# Patient Record
Sex: Male | Born: 1945 | Race: Black or African American | Hispanic: No | Smoking: Former smoker
Health system: Southern US, Community
[De-identification: ages and names within clinical notes are randomized; demographics above are authoritative.]

## PROBLEM LIST (undated history)

## (undated) DIAGNOSIS — G40909 Epilepsy, unspecified, not intractable, without status epilepticus: Secondary | ICD-10-CM

## (undated) DIAGNOSIS — I701 Atherosclerosis of renal artery: Secondary | ICD-10-CM

## (undated) DIAGNOSIS — Z8249 Family history of ischemic heart disease and other diseases of the circulatory system: Secondary | ICD-10-CM

## (undated) DIAGNOSIS — I255 Ischemic cardiomyopathy: Secondary | ICD-10-CM

## (undated) DIAGNOSIS — K5520 Angiodysplasia of colon without hemorrhage: Secondary | ICD-10-CM

## (undated) DIAGNOSIS — F1910 Other psychoactive substance abuse, uncomplicated: Secondary | ICD-10-CM

## (undated) DIAGNOSIS — J449 Chronic obstructive pulmonary disease, unspecified: Secondary | ICD-10-CM

## (undated) DIAGNOSIS — F329 Major depressive disorder, single episode, unspecified: Secondary | ICD-10-CM

## (undated) DIAGNOSIS — F32A Depression, unspecified: Secondary | ICD-10-CM

## (undated) DIAGNOSIS — I5042 Chronic combined systolic (congestive) and diastolic (congestive) heart failure: Secondary | ICD-10-CM

## (undated) DIAGNOSIS — G4733 Obstructive sleep apnea (adult) (pediatric): Secondary | ICD-10-CM

## (undated) DIAGNOSIS — D509 Iron deficiency anemia, unspecified: Secondary | ICD-10-CM

## (undated) DIAGNOSIS — M545 Low back pain, unspecified: Secondary | ICD-10-CM

## (undated) DIAGNOSIS — R569 Unspecified convulsions: Secondary | ICD-10-CM

## (undated) DIAGNOSIS — J189 Pneumonia, unspecified organism: Secondary | ICD-10-CM

## (undated) DIAGNOSIS — E785 Hyperlipidemia, unspecified: Secondary | ICD-10-CM

## (undated) DIAGNOSIS — I2510 Atherosclerotic heart disease of native coronary artery without angina pectoris: Secondary | ICD-10-CM

## (undated) DIAGNOSIS — G8929 Other chronic pain: Secondary | ICD-10-CM

## (undated) DIAGNOSIS — F209 Schizophrenia, unspecified: Secondary | ICD-10-CM

## (undated) DIAGNOSIS — I219 Acute myocardial infarction, unspecified: Secondary | ICD-10-CM

## (undated) DIAGNOSIS — F319 Bipolar disorder, unspecified: Secondary | ICD-10-CM

## (undated) DIAGNOSIS — I272 Pulmonary hypertension, unspecified: Secondary | ICD-10-CM

## (undated) DIAGNOSIS — M51369 Other intervertebral disc degeneration, lumbar region without mention of lumbar back pain or lower extremity pain: Secondary | ICD-10-CM

## (undated) DIAGNOSIS — I739 Peripheral vascular disease, unspecified: Secondary | ICD-10-CM

## (undated) DIAGNOSIS — N183 Chronic kidney disease, stage 3 unspecified: Secondary | ICD-10-CM

## (undated) DIAGNOSIS — Z9289 Personal history of other medical treatment: Secondary | ICD-10-CM

## (undated) DIAGNOSIS — M5136 Other intervertebral disc degeneration, lumbar region: Secondary | ICD-10-CM

## (undated) DIAGNOSIS — N400 Enlarged prostate without lower urinary tract symptoms: Secondary | ICD-10-CM

## (undated) DIAGNOSIS — I209 Angina pectoris, unspecified: Secondary | ICD-10-CM

## (undated) DIAGNOSIS — Z8719 Personal history of other diseases of the digestive system: Secondary | ICD-10-CM

## (undated) DIAGNOSIS — C801 Malignant (primary) neoplasm, unspecified: Secondary | ICD-10-CM

## (undated) DIAGNOSIS — I10 Essential (primary) hypertension: Secondary | ICD-10-CM

## (undated) DIAGNOSIS — M1990 Unspecified osteoarthritis, unspecified site: Secondary | ICD-10-CM

---

## 2007-08-19 HISTORY — PX: CORONARY ANGIOPLASTY WITH STENT PLACEMENT: SHX49

## 2007-12-18 ENCOUNTER — Ambulatory Visit: Payer: Self-pay | Admitting: Surgery

## 2007-12-19 HISTORY — PX: CORONARY ARTERY BYPASS GRAFT: SHX141

## 2008-11-26 ENCOUNTER — Encounter (INDEPENDENT_AMBULATORY_CARE_PROVIDER_SITE_OTHER): Payer: Self-pay | Admitting: Family Medicine

## 2008-11-26 ENCOUNTER — Encounter: Payer: Self-pay | Admitting: Emergency Medicine

## 2008-11-26 ENCOUNTER — Ambulatory Visit: Payer: Self-pay | Admitting: Cardiology

## 2008-11-26 ENCOUNTER — Ambulatory Visit: Payer: Self-pay | Admitting: Diagnostic Radiology

## 2008-11-26 ENCOUNTER — Inpatient Hospital Stay (HOSPITAL_COMMUNITY): Admission: AD | Admit: 2008-11-26 | Discharge: 2008-11-28 | Payer: Self-pay | Admitting: Internal Medicine

## 2010-03-20 DIAGNOSIS — I272 Pulmonary hypertension, unspecified: Secondary | ICD-10-CM | POA: Diagnosis present

## 2010-03-20 DIAGNOSIS — K922 Gastrointestinal hemorrhage, unspecified: Secondary | ICD-10-CM | POA: Insufficient documentation

## 2010-03-20 HISTORY — DX: Pulmonary hypertension, unspecified: I27.20

## 2010-03-20 HISTORY — PX: OTHER SURGICAL HISTORY: SHX169

## 2010-03-24 ENCOUNTER — Inpatient Hospital Stay (HOSPITAL_COMMUNITY)
Admission: EM | Admit: 2010-03-24 | Discharge: 2010-04-19 | Disposition: A | Discharge status: Home / Self Care | Payer: Medicare Other | Source: Home / Self Care | Attending: Internal Medicine | Admitting: Internal Medicine

## 2010-03-24 LAB — URINALYSIS, ROUTINE W REFLEX MICROSCOPIC
Bilirubin Urine: NEGATIVE
Hemoglobin, Urine: NEGATIVE
Ketones, ur: NEGATIVE mg/dL
Leukocytes, UA: NEGATIVE
Nitrite: NEGATIVE
Protein, ur: 100 mg/dL — AB
Specific Gravity, Urine: 1.024 (ref 1.005–1.030)
Urine Glucose, Fasting: NEGATIVE mg/dL
Urobilinogen, UA: 1 mg/dL (ref 0.0–1.0)
pH: 5 (ref 5.0–8.0)

## 2010-03-24 LAB — POCT I-STAT, CHEM 8
BUN: 38 mg/dL — ABNORMAL HIGH (ref 6–23)
Calcium, Ion: 1.14 mmol/L (ref 1.12–1.32)
Chloride: 109 mEq/L (ref 96–112)
Creatinine, Ser: 1.8 mg/dL — ABNORMAL HIGH (ref 0.4–1.5)
Glucose, Bld: 102 mg/dL — ABNORMAL HIGH (ref 70–99)
HCT: 28 % — ABNORMAL LOW (ref 39.0–52.0)
Hemoglobin: 9.5 g/dL — ABNORMAL LOW (ref 13.0–17.0)
Potassium: 5.3 mEq/L — ABNORMAL HIGH (ref 3.5–5.1)
Sodium: 139 mEq/L (ref 135–145)
TCO2: 22 mmol/L (ref 0–100)

## 2010-03-24 LAB — POCT CARDIAC MARKERS
CKMB, poc: 3.3 ng/mL (ref 1.0–8.0)
CKMB, poc: 1.3 ng/mL (ref 1.0–8.0)
CKMB, poc: 2.1 ng/mL (ref 1.0–8.0)
Myoglobin, poc: 104 ng/mL (ref 12–200)
Myoglobin, poc: 58.1 ng/mL (ref 12–200)
Myoglobin, poc: 76.8 ng/mL (ref 12–200)
Troponin i, poc: <0.05 ng/mL (ref 0.00–0.09)
Troponin i, poc: <0.05 ng/mL (ref 0.00–0.09)
Troponin i, poc: <0.05 ng/mL (ref 0.00–0.09)

## 2010-03-24 LAB — CK TOTAL AND CKMB (NOT AT ARMC)
CK, MB: 3.4 ng/mL (ref 0.3–4.0)
Relative Index: INVALID (ref 0.0–2.5)
Total CK: 76 U/L (ref 7–232)

## 2010-03-24 LAB — RAPID URINE DRUG SCREEN, HOSP PERFORMED
Amphetamines: NOT DETECTED
Barbiturates: NOT DETECTED
Benzodiazepines: NOT DETECTED
Cocaine: POSITIVE — AB
Opiates: NOT DETECTED
Tetrahydrocannabinol: NOT DETECTED

## 2010-03-24 LAB — HEPATIC FUNCTION PANEL
ALT: 29 U/L (ref 0–53)
AST: 63 U/L — ABNORMAL HIGH (ref 0–37)
Albumin: 3 g/dL — ABNORMAL LOW (ref 3.5–5.2)
Alkaline Phosphatase: 84 U/L (ref 39–117)
Bilirubin, Direct: 0.6 mg/dL — ABNORMAL HIGH (ref 0.0–0.3)
Indirect Bilirubin: 0.4 mg/dL (ref 0.3–0.9)
Total Bilirubin: 1 mg/dL (ref 0.3–1.2)
Total Protein: 6.9 g/dL (ref 6.0–8.3)

## 2010-03-24 LAB — T4, FREE: Free T4: 1.17 ng/dL (ref 0.80–1.80)

## 2010-03-24 LAB — MRSA PCR SCREENING: MRSA by PCR: NEGATIVE

## 2010-03-24 LAB — PROCALCITONIN: Procalcitonin: 0.11 ng/mL

## 2010-03-24 LAB — URINE MICROSCOPIC-ADD ON

## 2010-03-24 LAB — LIPASE, BLOOD: Lipase: 42 U/L (ref 11–59)

## 2010-03-24 LAB — BRAIN NATRIURETIC PEPTIDE: Pro B Natriuretic peptide (BNP): >3200 pg/mL — ABNORMAL HIGH (ref 0.0–100.0)

## 2010-03-24 LAB — TSH: TSH: 1.598 u[IU]/mL (ref 0.350–4.500)

## 2010-03-24 LAB — TROPONIN I: Troponin I: 0.05 ng/mL (ref 0.00–0.06)

## 2010-03-24 LAB — LACTIC ACID, PLASMA: Lactic Acid, Venous: 1.7 mmol/L (ref 0.5–2.2)

## 2010-03-25 ENCOUNTER — Encounter (INDEPENDENT_AMBULATORY_CARE_PROVIDER_SITE_OTHER): Payer: Self-pay | Admitting: Internal Medicine

## 2010-03-25 LAB — CBC
HCT: 23.6 % — ABNORMAL LOW (ref 39.0–52.0)
HCT: 24.5 % — ABNORMAL LOW (ref 39.0–52.0)
Hemoglobin: 7.1 g/dL — ABNORMAL LOW (ref 13.0–17.0)
Hemoglobin: 7.5 g/dL — ABNORMAL LOW (ref 13.0–17.0)
MCH: 27.3 pg (ref 26.0–34.0)
MCH: 28 pg (ref 26.0–34.0)
MCHC: 30.1 g/dL (ref 30.0–36.0)
MCHC: 30.6 g/dL (ref 30.0–36.0)
MCV: 90.8 fL (ref 78.0–100.0)
MCV: 91.4 fL (ref 78.0–100.0)
Platelets: 259 10*3/uL (ref 150–400)
Platelets: 280 10*3/uL (ref 150–400)
RBC: 2.6 MIL/uL — ABNORMAL LOW (ref 4.22–5.81)
RBC: 2.68 MIL/uL — ABNORMAL LOW (ref 4.22–5.81)
RDW: 22.3 % — ABNORMAL HIGH (ref 11.5–15.5)
RDW: 22.4 % — ABNORMAL HIGH (ref 11.5–15.5)
WBC: 5.1 10*3/uL (ref 4.0–10.5)
WBC: 5.5 10*3/uL (ref 4.0–10.5)

## 2010-03-25 LAB — DIFFERENTIAL
Basophils Absolute: 0 10*3/uL (ref 0.0–0.1)
Basophils Absolute: 0 10*3/uL (ref 0.0–0.1)
Basophils Relative: 0 % (ref 0–1)
Basophils Relative: 0 % (ref 0–1)
Eosinophils Absolute: 0.1 10*3/uL (ref 0.0–0.7)
Eosinophils Absolute: 0.1 10*3/uL (ref 0.0–0.7)
Eosinophils Relative: 1 % (ref 0–5)
Eosinophils Relative: 2 % (ref 0–5)
Lymphocytes Relative: 17 % (ref 12–46)
Lymphocytes Relative: 21 % (ref 12–46)
Lymphs Abs: 0.9 10*3/uL (ref 0.7–4.0)
Lymphs Abs: 1.2 10*3/uL (ref 0.7–4.0)
Monocytes Absolute: 0.7 10*3/uL (ref 0.1–1.0)
Monocytes Absolute: 0.7 10*3/uL (ref 0.1–1.0)
Monocytes Relative: 13 % — ABNORMAL HIGH (ref 3–12)
Monocytes Relative: 13 % — ABNORMAL HIGH (ref 3–12)
Neutro Abs: 3.4 10*3/uL (ref 1.7–7.7)
Neutro Abs: 3.6 10*3/uL (ref 1.7–7.7)
Neutrophils Relative %: 65 % (ref 43–77)
Neutrophils Relative %: 68 % (ref 43–77)

## 2010-03-25 LAB — LIPID PANEL
Cholesterol: 59 mg/dL (ref 0–200)
HDL: 15 mg/dL — ABNORMAL LOW (ref >39)
LDL Cholesterol: 33 mg/dL (ref 0–99)
Total CHOL/HDL Ratio: 3.9 RATIO
Triglycerides: 54 mg/dL (ref <150)
VLDL: 11 mg/dL (ref 0–40)

## 2010-03-25 LAB — TROPONIN I
Troponin I: 0.05 ng/mL (ref 0.00–0.06)
Troponin I: 0.04 ng/mL (ref 0.00–0.06)

## 2010-03-25 LAB — COMPREHENSIVE METABOLIC PANEL
ALT: 30 U/L (ref 0–53)
AST: 66 U/L — ABNORMAL HIGH (ref 0–37)
Albumin: 2.7 g/dL — ABNORMAL LOW (ref 3.5–5.2)
Alkaline Phosphatase: 73 U/L (ref 39–117)
BUN: 33 mg/dL — ABNORMAL HIGH (ref 6–23)
CO2: 22 mEq/L (ref 19–32)
Calcium: 8.6 mg/dL (ref 8.4–10.5)
Chloride: 105 mEq/L (ref 96–112)
Creatinine, Ser: 1.91 mg/dL — ABNORMAL HIGH (ref 0.4–1.5)
GFR calc Af Amer: 43 mL/min — ABNORMAL LOW (ref >60)
GFR calc non Af Amer: 35 mL/min — ABNORMAL LOW (ref >60)
Glucose, Bld: 72 mg/dL (ref 70–99)
Potassium: 4.9 mEq/L (ref 3.5–5.1)
Sodium: 135 mEq/L (ref 135–145)
Total Bilirubin: 1.1 mg/dL (ref 0.3–1.2)
Total Protein: 6.7 g/dL (ref 6.0–8.3)

## 2010-03-25 LAB — BRAIN NATRIURETIC PEPTIDE: Pro B Natriuretic peptide (BNP): >3200 pg/mL — ABNORMAL HIGH (ref 0.0–100.0)

## 2010-03-25 LAB — CK TOTAL AND CKMB (NOT AT ARMC)
CK, MB: 3 ng/mL (ref 0.3–4.0)
CK, MB: 2.7 ng/mL (ref 0.3–4.0)
Relative Index: INVALID (ref 0.0–2.5)
Relative Index: INVALID (ref 0.0–2.5)
Total CK: 70 U/L (ref 7–232)
Total CK: 62 U/L (ref 7–232)

## 2010-03-25 LAB — PHOSPHORUS: Phosphorus: 3.2 mg/dL (ref 2.3–4.6)

## 2010-03-25 LAB — HEMOGLOBIN A1C
Hgb A1c MFr Bld: 5.1 % (ref <5.7)
Mean Plasma Glucose: 100 mg/dL (ref <117)

## 2010-03-25 LAB — MAGNESIUM: Magnesium: 1.8 mg/dL (ref 1.5–2.5)

## 2010-04-04 LAB — RENAL FUNCTION PANEL
Albumin: 2.8 g/dL — ABNORMAL LOW (ref 3.5–5.2)
Albumin: 2.8 g/dL — ABNORMAL LOW (ref 3.5–5.2)
Albumin: 2.8 g/dL — ABNORMAL LOW (ref 3.5–5.2)
BUN: 35 mg/dL — ABNORMAL HIGH (ref 6–23)
BUN: 38 mg/dL — ABNORMAL HIGH (ref 6–23)
BUN: 38 mg/dL — ABNORMAL HIGH (ref 6–23)
CO2: 26 mEq/L (ref 19–32)
CO2: 29 mEq/L (ref 19–32)
CO2: 24 mEq/L (ref 19–32)
Calcium: 8.6 mg/dL (ref 8.4–10.5)
Calcium: 8.4 mg/dL (ref 8.4–10.5)
Calcium: 8.3 mg/dL — ABNORMAL LOW (ref 8.4–10.5)
Chloride: 94 mEq/L — ABNORMAL LOW (ref 96–112)
Chloride: 96 mEq/L (ref 96–112)
Chloride: 100 mEq/L (ref 96–112)
Creatinine, Ser: 1.64 mg/dL — ABNORMAL HIGH (ref 0.4–1.5)
Creatinine, Ser: 1.59 mg/dL — ABNORMAL HIGH (ref 0.4–1.5)
Creatinine, Ser: 1.38 mg/dL (ref 0.4–1.5)
GFR calc Af Amer: 51 mL/min — ABNORMAL LOW (ref >60)
GFR calc Af Amer: 53 mL/min — ABNORMAL LOW (ref >60)
GFR calc Af Amer: >60 mL/min (ref >60)
GFR calc non Af Amer: 42 mL/min — ABNORMAL LOW (ref >60)
GFR calc non Af Amer: 44 mL/min — ABNORMAL LOW (ref >60)
GFR calc non Af Amer: 52 mL/min — ABNORMAL LOW (ref >60)
Glucose, Bld: 200 mg/dL — ABNORMAL HIGH (ref 70–99)
Glucose, Bld: 134 mg/dL — ABNORMAL HIGH (ref 70–99)
Glucose, Bld: 231 mg/dL — ABNORMAL HIGH (ref 70–99)
Phosphorus: 3.1 mg/dL (ref 2.3–4.6)
Phosphorus: 3.2 mg/dL (ref 2.3–4.6)
Phosphorus: 1.1 mg/dL — ABNORMAL LOW (ref 2.3–4.6)
Potassium: 3.7 mEq/L (ref 3.5–5.1)
Potassium: 3.4 mEq/L — ABNORMAL LOW (ref 3.5–5.1)
Potassium: 3.9 mEq/L (ref 3.5–5.1)
Sodium: 130 mEq/L — ABNORMAL LOW (ref 135–145)
Sodium: 133 mEq/L — ABNORMAL LOW (ref 135–145)
Sodium: 132 mEq/L — ABNORMAL LOW (ref 135–145)

## 2010-04-04 LAB — BASIC METABOLIC PANEL
BUN: 36 mg/dL — ABNORMAL HIGH (ref 6–23)
BUN: 38 mg/dL — ABNORMAL HIGH (ref 6–23)
BUN: 42 mg/dL — ABNORMAL HIGH (ref 6–23)
BUN: 33 mg/dL — ABNORMAL HIGH (ref 6–23)
BUN: 36 mg/dL — ABNORMAL HIGH (ref 6–23)
BUN: 38 mg/dL — ABNORMAL HIGH (ref 6–23)
BUN: 35 mg/dL — ABNORMAL HIGH (ref 6–23)
BUN: 30 mg/dL — ABNORMAL HIGH (ref 6–23)
BUN: 30 mg/dL — ABNORMAL HIGH (ref 6–23)
CO2: 30 mEq/L (ref 19–32)
CO2: 29 mEq/L (ref 19–32)
CO2: 28 mEq/L (ref 19–32)
CO2: 26 mEq/L (ref 19–32)
CO2: 22 mEq/L (ref 19–32)
CO2: 23 mEq/L (ref 19–32)
CO2: 22 mEq/L (ref 19–32)
CO2: 27 mEq/L (ref 19–32)
CO2: 24 mEq/L (ref 19–32)
Calcium: 8.3 mg/dL — ABNORMAL LOW (ref 8.4–10.5)
Calcium: 8.1 mg/dL — ABNORMAL LOW (ref 8.4–10.5)
Calcium: 8.2 mg/dL — ABNORMAL LOW (ref 8.4–10.5)
Calcium: 8.8 mg/dL (ref 8.4–10.5)
Calcium: 8.7 mg/dL (ref 8.4–10.5)
Calcium: 8.7 mg/dL (ref 8.4–10.5)
Calcium: 8.8 mg/dL (ref 8.4–10.5)
Calcium: 8.8 mg/dL (ref 8.4–10.5)
Calcium: 8.8 mg/dL (ref 8.4–10.5)
Chloride: 95 mEq/L — ABNORMAL LOW (ref 96–112)
Chloride: 94 mEq/L — ABNORMAL LOW (ref 96–112)
Chloride: 97 mEq/L (ref 96–112)
Chloride: 104 mEq/L (ref 96–112)
Chloride: 103 mEq/L (ref 96–112)
Chloride: 101 mEq/L (ref 96–112)
Chloride: 97 mEq/L (ref 96–112)
Chloride: 99 mEq/L (ref 96–112)
Chloride: 94 mEq/L — ABNORMAL LOW (ref 96–112)
Creatinine, Ser: 1.38 mg/dL (ref 0.4–1.5)
Creatinine, Ser: 1.25 mg/dL (ref 0.4–1.5)
Creatinine, Ser: 1.33 mg/dL (ref 0.4–1.5)
Creatinine, Ser: 1.16 mg/dL (ref 0.4–1.5)
Creatinine, Ser: 1.39 mg/dL (ref 0.4–1.5)
Creatinine, Ser: 1.51 mg/dL — ABNORMAL HIGH (ref 0.4–1.5)
Creatinine, Ser: 1.62 mg/dL — ABNORMAL HIGH (ref 0.4–1.5)
Creatinine, Ser: 1.44 mg/dL (ref 0.4–1.5)
Creatinine, Ser: 1.4 mg/dL (ref 0.4–1.5)
GFR calc Af Amer: >60 mL/min (ref >60)
GFR calc Af Amer: >60 mL/min (ref >60)
GFR calc Af Amer: >60 mL/min (ref >60)
GFR calc Af Amer: >60 mL/min (ref >60)
GFR calc Af Amer: >60 mL/min (ref >60)
GFR calc Af Amer: 56 mL/min — ABNORMAL LOW (ref >60)
GFR calc Af Amer: 52 mL/min — ABNORMAL LOW (ref >60)
GFR calc Af Amer: 59 mL/min — ABNORMAL LOW (ref >60)
GFR calc Af Amer: >60 mL/min (ref >60)
GFR calc non Af Amer: 52 mL/min — ABNORMAL LOW (ref >60)
GFR calc non Af Amer: 58 mL/min — ABNORMAL LOW (ref >60)
GFR calc non Af Amer: 54 mL/min — ABNORMAL LOW (ref >60)
GFR calc non Af Amer: >60 mL/min (ref >60)
GFR calc non Af Amer: 51 mL/min — ABNORMAL LOW (ref >60)
GFR calc non Af Amer: 46 mL/min — ABNORMAL LOW (ref >60)
GFR calc non Af Amer: 43 mL/min — ABNORMAL LOW (ref >60)
GFR calc non Af Amer: 49 mL/min — ABNORMAL LOW (ref >60)
GFR calc non Af Amer: 51 mL/min — ABNORMAL LOW (ref >60)
Glucose, Bld: 149 mg/dL — ABNORMAL HIGH (ref 70–99)
Glucose, Bld: 95 mg/dL (ref 70–99)
Glucose, Bld: 99 mg/dL (ref 70–99)
Glucose, Bld: 105 mg/dL — ABNORMAL HIGH (ref 70–99)
Glucose, Bld: 86 mg/dL (ref 70–99)
Glucose, Bld: 100 mg/dL — ABNORMAL HIGH (ref 70–99)
Glucose, Bld: 144 mg/dL — ABNORMAL HIGH (ref 70–99)
Glucose, Bld: 68 mg/dL — ABNORMAL LOW (ref 70–99)
Glucose, Bld: 131 mg/dL — ABNORMAL HIGH (ref 70–99)
Potassium: 2.8 mEq/L — ABNORMAL LOW (ref 3.5–5.1)
Potassium: 2.8 mEq/L — ABNORMAL LOW (ref 3.5–5.1)
Potassium: 3.7 mEq/L (ref 3.5–5.1)
Potassium: 4.6 mEq/L (ref 3.5–5.1)
Potassium: 6.8 mEq/L (ref 3.5–5.1)
Potassium: 7.1 mEq/L (ref 3.5–5.1)
Potassium: 4.9 mEq/L (ref 3.5–5.1)
Potassium: 5.4 mEq/L — ABNORMAL HIGH (ref 3.5–5.1)
Potassium: 4.7 mEq/L (ref 3.5–5.1)
Sodium: 135 mEq/L (ref 135–145)
Sodium: 134 mEq/L — ABNORMAL LOW (ref 135–145)
Sodium: 134 mEq/L — ABNORMAL LOW (ref 135–145)
Sodium: 138 mEq/L (ref 135–145)
Sodium: 133 mEq/L — ABNORMAL LOW (ref 135–145)
Sodium: 134 mEq/L — ABNORMAL LOW (ref 135–145)
Sodium: 131 mEq/L — ABNORMAL LOW (ref 135–145)
Sodium: 133 mEq/L — ABNORMAL LOW (ref 135–145)
Sodium: 129 mEq/L — ABNORMAL LOW (ref 135–145)

## 2010-04-04 LAB — CBC
HCT: 24.8 % — ABNORMAL LOW (ref 39.0–52.0)
HCT: 24.7 % — ABNORMAL LOW (ref 39.0–52.0)
HCT: 23.7 % — ABNORMAL LOW (ref 39.0–52.0)
HCT: 27.9 % — ABNORMAL LOW (ref 39.0–52.0)
HCT: 28.6 % — ABNORMAL LOW (ref 39.0–52.0)
HCT: 29.3 % — ABNORMAL LOW (ref 39.0–52.0)
HCT: 26.2 % — ABNORMAL LOW (ref 39.0–52.0)
Hemoglobin: 7.9 g/dL — ABNORMAL LOW (ref 13.0–17.0)
Hemoglobin: 7.7 g/dL — ABNORMAL LOW (ref 13.0–17.0)
Hemoglobin: 7.3 g/dL — ABNORMAL LOW (ref 13.0–17.0)
Hemoglobin: 8.7 g/dL — ABNORMAL LOW (ref 13.0–17.0)
Hemoglobin: 9 g/dL — ABNORMAL LOW (ref 13.0–17.0)
Hemoglobin: 9.1 g/dL — ABNORMAL LOW (ref 13.0–17.0)
Hemoglobin: 8.3 g/dL — ABNORMAL LOW (ref 13.0–17.0)
MCH: 27.8 pg (ref 26.0–34.0)
MCH: 27.2 pg (ref 26.0–34.0)
MCH: 26.9 pg (ref 26.0–34.0)
MCH: 27.4 pg (ref 26.0–34.0)
MCH: 27.7 pg (ref 26.0–34.0)
MCH: 27.5 pg (ref 26.0–34.0)
MCH: 27.8 pg (ref 26.0–34.0)
MCHC: 31.9 g/dL (ref 30.0–36.0)
MCHC: 31.2 g/dL (ref 30.0–36.0)
MCHC: 30.8 g/dL (ref 30.0–36.0)
MCHC: 31.2 g/dL (ref 30.0–36.0)
MCHC: 31.5 g/dL (ref 30.0–36.0)
MCHC: 31.1 g/dL (ref 30.0–36.0)
MCHC: 31.7 g/dL (ref 30.0–36.0)
MCV: 87.3 fL (ref 78.0–100.0)
MCV: 87.3 fL (ref 78.0–100.0)
MCV: 87.5 fL (ref 78.0–100.0)
MCV: 87.7 fL (ref 78.0–100.0)
MCV: 88 fL (ref 78.0–100.0)
MCV: 88.5 fL (ref 78.0–100.0)
MCV: 87.6 fL (ref 78.0–100.0)
Platelets: 298 10*3/uL (ref 150–400)
Platelets: 273 10*3/uL (ref 150–400)
Platelets: 261 10*3/uL (ref 150–400)
Platelets: 252 10*3/uL (ref 150–400)
Platelets: 259 10*3/uL (ref 150–400)
Platelets: 243 10*3/uL (ref 150–400)
Platelets: 207 10*3/uL (ref 150–400)
RBC: 2.84 MIL/uL — ABNORMAL LOW (ref 4.22–5.81)
RBC: 2.83 MIL/uL — ABNORMAL LOW (ref 4.22–5.81)
RBC: 2.71 MIL/uL — ABNORMAL LOW (ref 4.22–5.81)
RBC: 3.18 MIL/uL — ABNORMAL LOW (ref 4.22–5.81)
RBC: 3.25 MIL/uL — ABNORMAL LOW (ref 4.22–5.81)
RBC: 3.31 MIL/uL — ABNORMAL LOW (ref 4.22–5.81)
RBC: 2.99 MIL/uL — ABNORMAL LOW (ref 4.22–5.81)
RDW: 20 % — ABNORMAL HIGH (ref 11.5–15.5)
RDW: 19.7 % — ABNORMAL HIGH (ref 11.5–15.5)
RDW: 19.9 % — ABNORMAL HIGH (ref 11.5–15.5)
RDW: 18.7 % — ABNORMAL HIGH (ref 11.5–15.5)
RDW: 18.8 % — ABNORMAL HIGH (ref 11.5–15.5)
RDW: 19 % — ABNORMAL HIGH (ref 11.5–15.5)
RDW: 18.8 % — ABNORMAL HIGH (ref 11.5–15.5)
WBC: 12.1 10*3/uL — ABNORMAL HIGH (ref 4.0–10.5)
WBC: 12.2 10*3/uL — ABNORMAL HIGH (ref 4.0–10.5)
WBC: 10.2 10*3/uL (ref 4.0–10.5)
WBC: 8.6 10*3/uL (ref 4.0–10.5)
WBC: 10.2 10*3/uL (ref 4.0–10.5)
WBC: 10.4 10*3/uL (ref 4.0–10.5)
WBC: 7.8 10*3/uL (ref 4.0–10.5)

## 2010-04-04 LAB — CARDIAC PANEL(CRET KIN+CKTOT+MB+TROPI)
CK, MB: 2.2 ng/mL (ref 0.3–4.0)
CK, MB: 2.4 ng/mL (ref 0.3–4.0)
CK, MB: 2.4 ng/mL (ref 0.3–4.0)
Relative Index: INVALID (ref 0.0–2.5)
Relative Index: INVALID (ref 0.0–2.5)
Relative Index: INVALID (ref 0.0–2.5)
Total CK: 22 U/L (ref 7–232)
Total CK: 22 U/L (ref 7–232)
Total CK: 26 U/L (ref 7–232)
Troponin I: 0.03 ng/mL (ref 0.00–0.06)
Troponin I: 0.04 ng/mL (ref 0.00–0.06)
Troponin I: 0.05 ng/mL (ref 0.00–0.06)

## 2010-04-04 LAB — CLOSTRIDIUM DIFFICILE BY PCR: Toxigenic C. Difficile by PCR: NEGATIVE

## 2010-04-04 LAB — HEMOGLOBIN A1C
Hgb A1c MFr Bld: 5 % (ref <5.7)
Hgb A1c MFr Bld: 7.1 % — ABNORMAL HIGH (ref <5.7)
Mean Plasma Glucose: 97 mg/dL (ref <117)
Mean Plasma Glucose: 157 mg/dL — ABNORMAL HIGH (ref <117)

## 2010-04-04 LAB — CROSSMATCH
ABO/RH(D): O POS
Antibody Screen: NEGATIVE
Unit division: 0
Unit division: 0

## 2010-04-04 LAB — GLUCOSE, CAPILLARY
Glucose-Capillary: 174 mg/dL — ABNORMAL HIGH (ref 70–99)
Glucose-Capillary: 156 mg/dL — ABNORMAL HIGH (ref 70–99)
Glucose-Capillary: 101 mg/dL — ABNORMAL HIGH (ref 70–99)
Glucose-Capillary: 102 mg/dL — ABNORMAL HIGH (ref 70–99)
Glucose-Capillary: 69 mg/dL — ABNORMAL LOW (ref 70–99)
Glucose-Capillary: 99 mg/dL (ref 70–99)
Glucose-Capillary: 114 mg/dL — ABNORMAL HIGH (ref 70–99)

## 2010-04-04 LAB — FERRITIN: Ferritin: 56 ng/mL (ref 22–322)

## 2010-04-04 LAB — IRON AND TIBC
Iron: 13 ug/dL — ABNORMAL LOW (ref 42–135)
Saturation Ratios: 4 % — ABNORMAL LOW (ref 20–55)
TIBC: 360 ug/dL (ref 215–435)
UIBC: 347 ug/dL

## 2010-04-04 LAB — BRAIN NATRIURETIC PEPTIDE
Pro B Natriuretic peptide (BNP): 866 pg/mL — ABNORMAL HIGH (ref 0.0–100.0)
Pro B Natriuretic peptide (BNP): 890 pg/mL — ABNORMAL HIGH (ref 0.0–100.0)

## 2010-04-04 LAB — POTASSIUM: Potassium: 7 mEq/L (ref 3.5–5.1)

## 2010-04-04 LAB — VITAMIN B12: Vitamin B-12: 508 pg/mL (ref 211–911)

## 2010-04-04 LAB — ABO/RH: ABO/RH(D): O POS

## 2010-04-04 LAB — FOLATE: Folate: 11.3 ng/mL

## 2010-04-04 LAB — HEMOCCULT GUIAC POC 1CARD (OFFICE): Fecal Occult Bld: POSITIVE

## 2010-04-05 ENCOUNTER — Encounter: Payer: Self-pay | Admitting: Internal Medicine

## 2010-04-06 LAB — BASIC METABOLIC PANEL
BUN: 30 mg/dL — ABNORMAL HIGH (ref 6–23)
BUN: 20 mg/dL (ref 6–23)
CO2: 25 mEq/L (ref 19–32)
CO2: 27 mEq/L (ref 19–32)
Calcium: 9 mg/dL (ref 8.4–10.5)
Calcium: 8.9 mg/dL (ref 8.4–10.5)
Chloride: 98 mEq/L (ref 96–112)
Chloride: 99 mEq/L (ref 96–112)
Creatinine, Ser: 1.26 mg/dL (ref 0.4–1.5)
Creatinine, Ser: 1.05 mg/dL (ref 0.4–1.5)
GFR calc Af Amer: >60 mL/min (ref >60)
GFR calc Af Amer: >60 mL/min (ref >60)
GFR calc non Af Amer: 57 mL/min — ABNORMAL LOW (ref >60)
GFR calc non Af Amer: >60 mL/min (ref >60)
Glucose, Bld: 141 mg/dL — ABNORMAL HIGH (ref 70–99)
Glucose, Bld: 76 mg/dL (ref 70–99)
Potassium: 4.2 mEq/L (ref 3.5–5.1)
Potassium: 4.4 mEq/L (ref 3.5–5.1)
Sodium: 135 mEq/L (ref 135–145)
Sodium: 134 mEq/L — ABNORMAL LOW (ref 135–145)

## 2010-04-06 LAB — GLUCOSE, CAPILLARY
Glucose-Capillary: 130 mg/dL — ABNORMAL HIGH (ref 70–99)
Glucose-Capillary: 175 mg/dL — ABNORMAL HIGH (ref 70–99)
Glucose-Capillary: 78 mg/dL (ref 70–99)
Glucose-Capillary: 88 mg/dL (ref 70–99)
Glucose-Capillary: 77 mg/dL (ref 70–99)
Glucose-Capillary: 122 mg/dL — ABNORMAL HIGH (ref 70–99)
Glucose-Capillary: 68 mg/dL — ABNORMAL LOW (ref 70–99)
Glucose-Capillary: 85 mg/dL (ref 70–99)
Glucose-Capillary: 138 mg/dL — ABNORMAL HIGH (ref 70–99)

## 2010-04-06 LAB — CBC
HCT: 32.2 % — ABNORMAL LOW (ref 39.0–52.0)
HCT: 31.5 % — ABNORMAL LOW (ref 39.0–52.0)
Hemoglobin: 10.2 g/dL — ABNORMAL LOW (ref 13.0–17.0)
Hemoglobin: 10.2 g/dL — ABNORMAL LOW (ref 13.0–17.0)
MCH: 27.4 pg (ref 26.0–34.0)
MCH: 27.6 pg (ref 26.0–34.0)
MCHC: 31.7 g/dL (ref 30.0–36.0)
MCHC: 32.4 g/dL (ref 30.0–36.0)
MCV: 86.6 fL (ref 78.0–100.0)
MCV: 85.1 fL (ref 78.0–100.0)
Platelets: 232 10*3/uL (ref 150–400)
Platelets: 214 10*3/uL (ref 150–400)
RBC: 3.72 MIL/uL — ABNORMAL LOW (ref 4.22–5.81)
RBC: 3.7 MIL/uL — ABNORMAL LOW (ref 4.22–5.81)
RDW: 18.1 % — ABNORMAL HIGH (ref 11.5–15.5)
RDW: 18 % — ABNORMAL HIGH (ref 11.5–15.5)
WBC: 8.1 10*3/uL (ref 4.0–10.5)
WBC: 7.4 10*3/uL (ref 4.0–10.5)

## 2010-04-06 LAB — CROSSMATCH
ABO/RH(D): O POS
Antibody Screen: NEGATIVE
Unit division: 0

## 2010-04-11 LAB — BASIC METABOLIC PANEL
BUN: 25 mg/dL — ABNORMAL HIGH (ref 6–23)
BUN: 34 mg/dL — ABNORMAL HIGH (ref 6–23)
BUN: 34 mg/dL — ABNORMAL HIGH (ref 6–23)
CO2: 25 mEq/L (ref 19–32)
CO2: 24 mEq/L (ref 19–32)
Calcium: 8.7 mg/dL (ref 8.4–10.5)
Calcium: 8.5 mg/dL (ref 8.4–10.5)
Calcium: 8.2 mg/dL — ABNORMAL LOW (ref 8.4–10.5)
Chloride: 101 mEq/L (ref 96–112)
Chloride: 102 mEq/L (ref 96–112)
Creatinine, Ser: 1.3 mg/dL (ref 0.4–1.5)
Creatinine, Ser: 1.19 mg/dL (ref 0.4–1.5)
GFR calc Af Amer: >60 mL/min (ref >60)
GFR calc Af Amer: >60 mL/min (ref >60)
GFR calc non Af Amer: 55 mL/min — ABNORMAL LOW (ref >60)
GFR calc non Af Amer: >60 mL/min (ref >60)
GFR calc non Af Amer: >60 mL/min (ref >60)
Glucose, Bld: 96 mg/dL (ref 70–99)
Glucose, Bld: 69 mg/dL — ABNORMAL LOW (ref 70–99)
Glucose, Bld: 113 mg/dL — ABNORMAL HIGH (ref 70–99)
Potassium: 4.3 mEq/L (ref 3.5–5.1)
Potassium: 4.3 mEq/L (ref 3.5–5.1)
Sodium: 134 mEq/L — ABNORMAL LOW (ref 135–145)
Sodium: 136 mEq/L (ref 135–145)

## 2010-04-11 LAB — CBC
HCT: 31.6 % — ABNORMAL LOW (ref 39.0–52.0)
HCT: 30.7 % — ABNORMAL LOW (ref 39.0–52.0)
Hemoglobin: 9.9 g/dL — ABNORMAL LOW (ref 13.0–17.0)
Hemoglobin: 9.7 g/dL — ABNORMAL LOW (ref 13.0–17.0)
MCH: 27.2 pg (ref 26.0–34.0)
MCH: 27.3 pg (ref 26.0–34.0)
MCHC: 31.3 g/dL (ref 30.0–36.0)
MCHC: 31.6 g/dL (ref 30.0–36.0)
MCV: 86.8 fL (ref 78.0–100.0)
MCV: 86.5 fL (ref 78.0–100.0)
Platelets: 205 10*3/uL (ref 150–400)
Platelets: 189 10*3/uL (ref 150–400)
RBC: 3.64 MIL/uL — ABNORMAL LOW (ref 4.22–5.81)
RBC: 3.55 MIL/uL — ABNORMAL LOW (ref 4.22–5.81)
RDW: 18.1 % — ABNORMAL HIGH (ref 11.5–15.5)
RDW: 18 % — ABNORMAL HIGH (ref 11.5–15.5)
WBC: 8.3 10*3/uL (ref 4.0–10.5)
WBC: 9.9 10*3/uL (ref 4.0–10.5)

## 2010-04-11 LAB — GLUCOSE, CAPILLARY
Glucose-Capillary: 101 mg/dL — ABNORMAL HIGH (ref 70–99)
Glucose-Capillary: 142 mg/dL — ABNORMAL HIGH (ref 70–99)
Glucose-Capillary: 108 mg/dL — ABNORMAL HIGH (ref 70–99)
Glucose-Capillary: 70 mg/dL (ref 70–99)
Glucose-Capillary: 168 mg/dL — ABNORMAL HIGH (ref 70–99)
Glucose-Capillary: 119 mg/dL — ABNORMAL HIGH (ref 70–99)

## 2010-04-11 LAB — BRAIN NATRIURETIC PEPTIDE: Pro B Natriuretic peptide (BNP): 238 pg/mL — ABNORMAL HIGH (ref 0.0–100.0)

## 2010-04-12 ENCOUNTER — Encounter: Payer: Self-pay | Admitting: Gastroenterology

## 2010-04-12 LAB — CBC
HCT: 24 % — ABNORMAL LOW (ref 39.0–52.0)
HCT: 22.3 % — ABNORMAL LOW (ref 39.0–52.0)
Hemoglobin: 7.5 g/dL — ABNORMAL LOW (ref 13.0–17.0)
Hemoglobin: 7.3 g/dL — ABNORMAL LOW (ref 13.0–17.0)
Hemoglobin: 7.5 g/dL — ABNORMAL LOW (ref 13.0–17.0)
MCH: 27 pg (ref 26.0–34.0)
MCH: 28.2 pg (ref 26.0–34.0)
MCHC: 31.3 g/dL (ref 30.0–36.0)
MCHC: 32.7 g/dL (ref 30.0–36.0)
MCHC: 32.8 g/dL (ref 30.0–36.0)
RBC: 2.78 MIL/uL — ABNORMAL LOW (ref 4.22–5.81)
RBC: 2.6 MIL/uL — ABNORMAL LOW (ref 4.22–5.81)
RDW: 17.3 % — ABNORMAL HIGH (ref 11.5–15.5)
WBC: 6.7 10*3/uL (ref 4.0–10.5)

## 2010-04-12 LAB — COMPREHENSIVE METABOLIC PANEL
Alkaline Phosphatase: 122 U/L — ABNORMAL HIGH (ref 39–117)
BUN: 32 mg/dL — ABNORMAL HIGH (ref 6–23)
CO2: 25 mEq/L (ref 19–32)
Calcium: 8.8 mg/dL (ref 8.4–10.5)
Creatinine, Ser: 1.05 mg/dL (ref 0.4–1.5)
Glucose, Bld: 150 mg/dL — ABNORMAL HIGH (ref 70–99)
Sodium: 136 mEq/L (ref 135–145)
Total Bilirubin: 0.4 mg/dL (ref 0.3–1.2)
Total Protein: 6.5 g/dL (ref 6.0–8.3)

## 2010-04-12 LAB — MAGNESIUM: Magnesium: 1.9 mg/dL (ref 1.5–2.5)

## 2010-04-12 LAB — BASIC METABOLIC PANEL
BUN: 31 mg/dL — ABNORMAL HIGH (ref 6–23)
CO2: 24 mEq/L (ref 19–32)
CO2: 27 mEq/L (ref 19–32)
Calcium: 8.3 mg/dL — ABNORMAL LOW (ref 8.4–10.5)
Calcium: 8.5 mg/dL (ref 8.4–10.5)
Chloride: 105 mEq/L (ref 96–112)
Creatinine, Ser: 1.2 mg/dL (ref 0.4–1.5)
GFR calc Af Amer: >60 mL/min (ref >60)
GFR calc non Af Amer: >60 mL/min (ref >60)
GFR calc non Af Amer: >60 mL/min (ref >60)
Glucose, Bld: 136 mg/dL — ABNORMAL HIGH (ref 70–99)
Glucose, Bld: 128 mg/dL — ABNORMAL HIGH (ref 70–99)
Glucose, Bld: 84 mg/dL (ref 70–99)
Potassium: 4.1 mEq/L (ref 3.5–5.1)
Potassium: 4 mEq/L (ref 3.5–5.1)
Sodium: 137 mEq/L (ref 135–145)
Sodium: 135 mEq/L (ref 135–145)

## 2010-04-12 LAB — HEMOGLOBIN AND HEMATOCRIT, BLOOD
HCT: 24.1 % — ABNORMAL LOW (ref 39.0–52.0)
Hemoglobin: 7.9 g/dL — ABNORMAL LOW (ref 13.0–17.0)

## 2010-04-12 LAB — GLUCOSE, CAPILLARY
Glucose-Capillary: 95 mg/dL (ref 70–99)
Glucose-Capillary: 116 mg/dL — ABNORMAL HIGH (ref 70–99)
Glucose-Capillary: 135 mg/dL — ABNORMAL HIGH (ref 70–99)
Glucose-Capillary: 103 mg/dL — ABNORMAL HIGH (ref 70–99)

## 2010-04-12 LAB — CROSSMATCH: ABO/RH(D): O POS

## 2010-04-12 LAB — RETICULOCYTES
RBC.: 2.83 MIL/uL — ABNORMAL LOW (ref 4.22–5.81)
Retic Ct Pct: 1.6 % (ref 0.4–3.1)

## 2010-04-12 LAB — HEMOCCULT GUIAC POC 1CARD (OFFICE): Fecal Occult Bld: POSITIVE

## 2010-04-12 LAB — PHOSPHORUS: Phosphorus: 2.4 mg/dL (ref 2.3–4.6)

## 2010-04-13 LAB — IMMUNOFIXATION ELECTROPHORESIS
IgM, Serum: 66 mg/dL (ref 60–263)
Total Protein ELP: 6.5 g/dL (ref 6.0–8.3)

## 2010-04-13 LAB — CBC
HCT: 23.7 % — ABNORMAL LOW (ref 39.0–52.0)
HCT: 22.8 % — ABNORMAL LOW (ref 39.0–52.0)
Hemoglobin: 7.7 g/dL — ABNORMAL LOW (ref 13.0–17.0)
Hemoglobin: 7.4 g/dL — ABNORMAL LOW (ref 13.0–17.0)
MCH: 28 pg (ref 26.0–34.0)
MCH: 28 pg (ref 26.0–34.0)
MCHC: 32.5 g/dL (ref 30.0–36.0)
MCV: 86.2 fL (ref 78.0–100.0)
MCV: 86.4 fL (ref 78.0–100.0)
RBC: 2.64 MIL/uL — ABNORMAL LOW (ref 4.22–5.81)

## 2010-04-13 LAB — BASIC METABOLIC PANEL
BUN: 32 mg/dL — ABNORMAL HIGH (ref 6–23)
CO2: 27 mEq/L (ref 19–32)
CO2: 23 mEq/L (ref 19–32)
Calcium: 8.8 mg/dL (ref 8.4–10.5)
Chloride: 101 mEq/L (ref 96–112)
Creatinine, Ser: 1.18 mg/dL (ref 0.4–1.5)
Creatinine, Ser: 1.28 mg/dL (ref 0.4–1.5)
Glucose, Bld: 90 mg/dL (ref 70–99)
Glucose, Bld: 93 mg/dL (ref 70–99)
Sodium: 137 mEq/L (ref 135–145)

## 2010-04-13 LAB — PROTEIN, URINE, 24 HOUR: Collection Interval-UPROT: 24 hours

## 2010-04-13 LAB — GLUCOSE, CAPILLARY: Glucose-Capillary: 142 mg/dL — ABNORMAL HIGH (ref 70–99)

## 2010-04-13 LAB — PROTEIN ELECTROPH W RFLX QUANT IMMUNOGLOBULINS
Albumin ELP: 49.4 % — ABNORMAL LOW (ref 55.8–66.1)
Alpha-1-Globulin: 5.6 % — ABNORMAL HIGH (ref 2.9–4.9)

## 2010-04-13 LAB — CREATININE CLEARANCE, URINE, 24 HOUR
Creatinine, 24H Ur: 904 mg/d (ref 800–2000)
Creatinine: 1.18 mg/dL (ref 0.4–1.5)
Urine Total Volume-CRCL: 2050 mL

## 2010-04-14 LAB — UIFE/LIGHT CHAINS/TP QN, 24-HR UR
Albumin, U: DETECTED
Free Kappa/Lambda Ratio: 13.54 ratio — ABNORMAL HIGH (ref 0.46–4.00)
Free Lambda Excretion/Day: 16.2 mg/d
Free Lambda Lt Chains,Ur: 0.79 mg/dL (ref 0.08–1.01)
Free Lt Chn Excr Rate: 219.35 mg/d
Total Protein, Urine-Ur/day: 324 mg/d — ABNORMAL HIGH (ref 10–140)
Total Protein, Urine: 15.8 mg/dL
Volume, Urine: 2050 mL

## 2010-04-14 LAB — URINE CULTURE
Colony Count: >=100000
Culture  Setup Time: 201201052121

## 2010-04-14 LAB — GLUCOSE, CAPILLARY
Glucose-Capillary: 172 mg/dL — ABNORMAL HIGH (ref 70–99)
Glucose-Capillary: 84 mg/dL (ref 70–99)

## 2010-04-14 NOTE — Discharge Summary (Addendum)
  [name], Charles              ACCOUNT NO.:  [id number]  MEDICAL RECORD NO.:  [id number]          PATIENT TYPE:  INP  LOCATION:  3732                         FACILITY:  [facility]  PHYSICIAN:  Keithan Dileonardo I Shatarra Wehling, MD      DATE OF BIRTH:  01-27-1946  DATE OF ADMISSION:  03/24/2010 DATE OF DISCHARGE:  04/07/2010                              DISCHARGE SUMMARY   PRIMARY CARE PHYSICIAN:  Darius Bump, M.D. at [facility].  DISCHARGE DIAGNOSIS:  Acute-on-chronic systolic congestive heart failure with EF around 35-40%.   Dictation ended at this point.     Charles Eckels Bosie Helper, MD     HIE/MEDQ  D:  04/07/2010  T:  04/07/2010  Job:  [id number]  Electronically Signed by Charles Cargo MD on 04/14/2010 02:00:23 PM

## 2010-04-14 NOTE — Progress Notes (Addendum)
Charles [name], Charles [name]              ACCOUNT NO.:  [id number]  MEDICAL RECORD NO.:  [id number]          PATIENT TYPE:  INP  LOCATION:  3032                         FACILITY:  [facility]  PHYSICIAN:  Charles Brazeau Bosie Helper, MD      DATE OF BIRTH:  05/07/1945                                PROGRESS NOTE   PRIMARY CARE PHYSICIAN: Charles Bump, MD  DISCHARGE DIAGNOSES: 1. Acute on chronic systolic congestive heart failure, EF of 35-40%. 2. Severe pulmonary hypertension felt to be secondary to cocaine abuse     and left ventricular failure and pulmonary embolism was ruled out. 3. Acute renal failure, resolved. 4. Hyperkalemia, resolved. 5. Active gastrointestinal bleeding status post EGD and colonoscopy     negative. 6. Capsule endoscopy done today, which was pending. 7. Iron-deficiency anemia. 8. Acute blood loss anemia status post 4 units of blood transfusion. 9. Cocaine abuse. 10.Hypertension. 11.Hypercholesterolemia.  DISCHARGE MEDICATIONS: To be dictated at the date of actual discharge.  PROCEDURE: 1. CT of the abdomen and pelvis, small pleural effusion, ascites,     anasarca, could indicate that there is presence of volume overload     or hypoproteinemia. 2. CT head without contrast, no acute intracranial abnormality. 3. V/Q scan, probably normal ventilation perfusion. 4. Chest x-ray no acute abnormality. 5. EGD and colonoscopy.  Normal colon, internal hemorrhoids. 6. EGD, hiatal hernia.  No abnormalities.  HISTORY OF PRESENT ILLNESS: This is a 65 year old with a history of cardiac bypass in 2009, suspected cardiomyopathy with an EF 35-40%, with chief complaint of chest pain and epigastric pain.  On presentation to the Emergency Room, the patient had 12-lead EKG, shows sinus rhythm, non-ST segment intraventricular conduction delay, non-specific ST-T wave changes, no change from previous EKG.  Hemoglobin was 9.5 and hematocrit 28.  The patient have a BUN of 38 and creatinine 1.8.   BNP is elevated up to 3200.  1. Chest pain.  The patient rule out for MI with cardiac enzyme and     troponin which were negative.  During hospital stay, the patient     noticed to have melena and GI were consulted.  The patient     underwent EGD and colonoscopy, reports were normal.  The patient     received 3 units of blood transfusion.  But, during hospital stay     the patient continued to have drop in his hemoglobin and required     another unit of blood transfusion, hemoglobin at this time is 7.7.     We have to reconsult the Gastroenterology to evaluate the cause for     the patient's drop in his hemoglobin.  Anemia slowly did show     evidence for iron-deficiency anemia.  Dr. Truett [name], Hematologist     evaluated the patient and recommend, this is most probably     secondary to iron-deficiency anemia.  I recommend Procrit and     ferrous sulfate, which was prescribed for the patient.  1. Acute on chronic systolic CHF.  The patient had significant lower     extremity swelling which respond to recurrent dose of Lasix for his  potassium sulfate with potassium supplement.  Echo did show the     patient have increase in pulmonary systolic blood pressure, which     causes most probably secondary to cocaine and left ventricular     heart failure.  V/Q scan was negative and venous Doppler of the     patient's lower extremity was negative.  1. Cocaine abuse, counseling was done during hospital stay.  1. Acute renal failure, resolved.  DISPOSITION: The patient will be discharged to shelter pending clearance from GI.     Charles Gruenwald Bosie Helper, MD     HIE/MEDQ  D:  04/12/2010  T:  04/12/2010  Job:  [id number]  Electronically Signed by Charles Cargo MD on 04/14/2010 02:00:29 PM

## 2010-04-15 ENCOUNTER — Encounter: Payer: Self-pay | Admitting: Gastroenterology

## 2010-04-15 LAB — BASIC METABOLIC PANEL
BUN: 27 mg/dL — ABNORMAL HIGH (ref 6–23)
Chloride: 102 mEq/L (ref 96–112)
Potassium: 4.4 mEq/L (ref 3.5–5.1)
Sodium: 139 mEq/L (ref 135–145)

## 2010-04-15 LAB — CBC
HCT: 27.3 % — ABNORMAL LOW (ref 39.0–52.0)
Hemoglobin: 8.6 g/dL — ABNORMAL LOW (ref 13.0–17.0)
MCV: 86.9 fL (ref 78.0–100.0)
RDW: 19.5 % — ABNORMAL HIGH (ref 11.5–15.5)
WBC: 6.8 10*3/uL (ref 4.0–10.5)

## 2010-04-15 LAB — GLUCOSE, CAPILLARY: Glucose-Capillary: 86 mg/dL (ref 70–99)

## 2010-04-16 LAB — CROSSMATCH
ABO/RH(D): O POS
Unit division: 0

## 2010-04-16 LAB — CBC
MCV: 87.1 fL (ref 78.0–100.0)
Platelets: 478 10*3/uL — ABNORMAL HIGH (ref 150–400)
RBC: 3.1 MIL/uL — ABNORMAL LOW (ref 4.22–5.81)
RDW: 19.5 % — ABNORMAL HIGH (ref 11.5–15.5)
WBC: 8.9 10*3/uL (ref 4.0–10.5)

## 2010-04-16 LAB — COMPREHENSIVE METABOLIC PANEL
ALT: 28 U/L (ref 0–53)
AST: 51 U/L — ABNORMAL HIGH (ref 0–37)
Albumin: 3.1 g/dL — ABNORMAL LOW (ref 3.5–5.2)
Alkaline Phosphatase: 80 U/L (ref 39–117)
Calcium: 8.4 mg/dL (ref 8.4–10.5)
GFR calc Af Amer: >60 mL/min (ref >60)
Potassium: 4.4 mEq/L (ref 3.5–5.1)
Sodium: 133 mEq/L — ABNORMAL LOW (ref 135–145)
Total Protein: 6.5 g/dL (ref 6.0–8.3)

## 2010-04-16 LAB — GLUCOSE, CAPILLARY
Glucose-Capillary: 115 mg/dL — ABNORMAL HIGH (ref 70–99)
Glucose-Capillary: 115 mg/dL — ABNORMAL HIGH (ref 70–99)
Glucose-Capillary: 154 mg/dL — ABNORMAL HIGH (ref 70–99)

## 2010-04-17 LAB — GLUCOSE, CAPILLARY
Glucose-Capillary: 107 mg/dL — ABNORMAL HIGH (ref 70–99)
Glucose-Capillary: 100 mg/dL — ABNORMAL HIGH (ref 70–99)
Glucose-Capillary: 108 mg/dL — ABNORMAL HIGH (ref 70–99)

## 2010-04-17 LAB — CBC
Hemoglobin: 8.7 g/dL — ABNORMAL LOW (ref 13.0–17.0)
MCH: 27.8 pg (ref 26.0–34.0)
MCHC: 31.4 g/dL (ref 30.0–36.0)
MCV: 88.5 fL (ref 78.0–100.0)
Platelets: 469 10*3/uL — ABNORMAL HIGH (ref 150–400)
RBC: 3.13 MIL/uL — ABNORMAL LOW (ref 4.22–5.81)

## 2010-04-17 LAB — BASIC METABOLIC PANEL
BUN: 27 mg/dL — ABNORMAL HIGH (ref 6–23)
CO2: 24 mEq/L (ref 19–32)
Calcium: 8.7 mg/dL (ref 8.4–10.5)
Chloride: 97 mEq/L (ref 96–112)
Creatinine, Ser: 1.79 mg/dL — ABNORMAL HIGH (ref 0.4–1.5)
GFR calc Af Amer: 46 mL/min — ABNORMAL LOW (ref >60)

## 2010-04-17 LAB — URINALYSIS, ROUTINE W REFLEX MICROSCOPIC
Hgb urine dipstick: NEGATIVE
Specific Gravity, Urine: 1.011 (ref 1.005–1.030)
Urine Glucose, Fasting: NEGATIVE mg/dL

## 2010-04-17 LAB — SODIUM, URINE, RANDOM: Sodium, Ur: 94 mEq/L

## 2010-04-17 LAB — CREATININE, URINE, RANDOM: Creatinine, Urine: 55.6 mg/dL

## 2010-04-18 LAB — BASIC METABOLIC PANEL
Calcium: 8.6 mg/dL (ref 8.4–10.5)
Creatinine, Ser: 1.55 mg/dL — ABNORMAL HIGH (ref 0.4–1.5)
GFR calc Af Amer: 55 mL/min — ABNORMAL LOW (ref >60)
GFR calc non Af Amer: 45 mL/min — ABNORMAL LOW (ref >60)
Glucose, Bld: 80 mg/dL (ref 70–99)
Sodium: 133 mEq/L — ABNORMAL LOW (ref 135–145)

## 2010-04-18 LAB — CBC
HCT: 25.4 % — ABNORMAL LOW (ref 39.0–52.0)
MCH: 28.7 pg (ref 26.0–34.0)
MCHC: 32.7 g/dL (ref 30.0–36.0)
RDW: 19.1 % — ABNORMAL HIGH (ref 11.5–15.5)

## 2010-04-18 LAB — GLUCOSE, CAPILLARY: Glucose-Capillary: 79 mg/dL (ref 70–99)

## 2010-04-18 NOTE — Consult Note (Signed)
NAMESABIEN, [name]              ACCOUNT NO.:  [id number]  MEDICAL RECORD NO.:  [id number]          PATIENT TYPE:  INP  LOCATION:  3032                         FACILITY:  [facility]  PHYSICIAN:  Leighton Roach. Truett Perna, M.D. DATE OF BIRTH:  [birth year]  DATE OF CONSULTATION:  04/11/2010 DATE OF DISCHARGE:                                CONSULTATION   REASON FOR CONSULTATION:  Anemia.  HISTORY OF PRESENT ILLNESS:  [name] is a 65 year old African American homeless male, with multiple medical problems including cardiac  issues, as well as renal insufficiency and cocaine habituation.  The patient was admitted with chest pain on March 24, 2010, complicated with epigastric pain and vomiting.  Labs on admission were remarkable for a hemoglobin of 9.5 and a hematocrit of 28, with eventual drop, and a Hemoccult showing positive results.  GI evaluation was requested, and upper endoscopy was performed on April 03, 2010, showing no bleed, and hiatal hernia.  The colonoscopy on April 05, 2010, revealed internal hemorrhoids, but no masses or lesions to explain his anemia.  To date, the patient received 4 units of packed RBCs, after his hemoglobin was showing values between 7 and 22 respectively.  Iron was 13 on March 29, 2010, with a ferritin of 56.  The patient has not received iron supplements to date.  It is important to mention that back in September 2010, his hemoglobin was 12.4 with a hematocrit of 38.5 respectively. Also noted, in reviewing records, he has received 2 units of blood in [place] in December and 2 units while in [place] back in October 2011.  At this time, no records are available for review.  We were asked to see the patient in consultation, while he is having other issues managed, including cardiac, and renal insufficiency.  Of note, his CT of the abdomen and pelvis with contrast upon admission on March 24, 2010, other than small pleural effusion, ascites, and  anasarca shows no masses or organomegaly.  CT of the brain is negative for hemorrage or masses.  PAST MEDICAL HISTORY: 1. History of systolic congestive heart failure, ejection fraction 35%     to 40%. 2. CAD status post MI in 2009. 3. Obstructive sleep apnea, does not use CPAP. 4. Chronic bronchitis, with acute episode requiring antibiotics in the     form of Avelox, as well as steroids, from April 01, 2010, through     April 04, 2010. 5. History of cocaine abuse. 6. Hypertension. 7. Medicine noncompliance. 8. Homelessness.  SURGERY:  Status post CABG in October 2009.  ALLERGIES:  TYLENOL and MOTRIN.  MEDICATIONS:  Norvasc, aspirin, Coreg, Plavix, Depakote, Lasix, Mucinex, Apresoline, Imdur, Prinivil, Protonix, K-Dur, triamcinolone cream, Tussionex, Atrovent, morphine sulfate.  As an outpatient, he takes aspirin and Plavix.  REVIEW OF SYSTEMS:  Remarkable for dyspnea on exertion, some orthopnea, fatigue, occasional nausea, vomiting on admission, which is now resolved, intermittent cramps, GERD symptoms.  Rest of the review of systems is negative.  FAMILY HISTORY:  Positive for cardiac disease, but negative for cancers or hematological abnormalities.  SOCIAL HISTORY:  The patient is single.  He has one daughter  in [place], he lives in a [facility] homeless shelter, but wants to move to a shelter in [place]. Originally from [place] [facility] Full code.  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  He denies any tobacco or alcohol; however, he does admit to cocaine, last on March 23, 2010.  PHYSICAL EXAMINATION:  GENERAL:  This is a well-developed 65 year old African American male in no acute distress, alert and oriented x3. VITAL SIGNS:  Blood pressure 141/69, pulse 85, respirations 20, temperature 98.2, O2 sats 98% on room air, weight 72 kg. HEENT:  Normocephalic, atraumatic.  Arcus senilis.  PERRLA.  Oral cavity without thrush or  lesions. NECK:  Remarkable for left scalene, shotty palpable lymph node, with no other cervical or supraclavicular masses. LUNGS:  Essentially clear to auscultation bilaterally.  No axillary masses. CARDIOVASCULAR:  Regular rate and rhythm with 1/6 systolic murmur.  No rubs or gallops. ABDOMEN:  Soft, nontender.  Bowel sounds x4.  No hepatosplenomegaly. GU and RECTAL: Asymmetrical testes. No penile lesions. no discharge. No masses palpable. EXTREMITIES:  No clubbing or cyanosis.  There is 1+ edema bilaterally. SKIN:  Remarkable for subcutaneous lesions, of unknown etiology, especially in the left hip towards the buttock area,tender upon palpation,  and in his back other similar lesions noted. NEURO:  Nonfocal. MUSCULOSKELETAL:  No spinal tenderness.  RADIOLOGICAL STUDIES:  As per HPI.  LABORATORY DATA:  Hemoglobin 7.5, hematocrit 22.9, white count 5.9, platelets 178.  MCV 86.1, iron 13, TIBC 360, percentage saturation 4, ferritin 56, folic acid 11.3, B12 of 508, ANC 3.4, lymphocytes 0.9, monocytes 0.7.  BNP 890.  Sodium 137, potassium 4.0, BUN 31, creatinine 1.05, glucose 84, calcium 8.5, magnesium 1.9.  TSH 1598.  Cocaine was positive on March 24, 2010.  Peripheral blood smear shows components of iron deficiency and possibly renal insufficiency, with hypochromic RBCs, ovalocytes, few tear drops, one nucleated RBC, mild increase in polychromasia.  His white count and platelets are normal.  IMPRESSION: 1. Anemia. 2. Congestive heart failure. 3. Heme-positive stools. 4. Renal insufficiency. 5. Proteinuria. 6. Hypertension. 7. Cocaine abuse. 8. Skin lesions.  Dr. Truett Perna has seen and evaluated the patient.  The anemia is likely multifactorial with components of GI bleed, iron deficiency, and possibly renal insufficiency.  The patient has no symptoms to suggest a malignancy, but this is possible.  RECOMMENDATIONS: 1. Start ferrous sulfate when the GI workup is  completed. 2. Check 24-hour urine for creatinine clearance, and UPEP. 3. Check SPEP and serum immunofixation. 4. Check HIV. 5. Trial of EPO pending on the 24-hour creatinine clearance and the     EPO levels.  Thank you very much for allowing [place] the opportunity to participate in the care of Mr. Buys, following the results of the labs, further recommendations are to proceed.     Marlowe Kays, P.A.   ______________________________ Leighton Roach. Truett Perna, M.D.    SW/MEDQ  D:  04/12/2010  T:  04/12/2010  Job:  [id number]  cc:   Darius Bump, M.D. Dr. Albin Felling  Electronically Signed by Marlowe Kays P.A. on 04/13/2010 08:31:20 AM Electronically Signed by Thornton Papas M.D. on 04/18/2010 02:17:54 PM

## 2010-04-19 LAB — KAPPA/LAMBDA LIGHT CHAINS
Kappa free light chain: 3.38 mg/dL — ABNORMAL HIGH (ref 0.33–1.94)
Lambda free light chains: 3.18 mg/dL — ABNORMAL HIGH (ref 0.57–2.63)

## 2010-04-19 LAB — GLUCOSE, CAPILLARY: Glucose-Capillary: 85 mg/dL (ref 70–99)

## 2010-04-20 NOTE — Op Note (Signed)
  [name], Charles NO.:  [id number]  MEDICAL RECORD NO.:  [id number]          PATIENT TYPE:  INP  LOCATION:  3032                         FACILITY:  [facility]  PHYSICIAN:  Iva Boop, MD,FACGDATE OF BIRTH:  1945/06/28  DATE OF PROCEDURE:  04/12/2010 DATE OF DISCHARGE:                              OPERATIVE REPORT   PROCEDURE:  Small bowel capsule endoscopy.  INDICATION:  Persistent heme-positive stools and anemia, GI bleeding with unrevealing upper endoscopy and colonoscopy.  DATA:  Gastric passage time 56 minutes.  Small bowel passage time 5 hours 20 minutes.  FINDINGS:  There is active oozing of blood in the proximal small bowel approximately 35 minutes beyond the first duodenal image.  This was an otherwise normal examination.  The blood was seen approximately 35 minutes past the first duodenal image as stated and then last blood and clots seen at about 50 minutes or so.  There were no other lesions seen as stated.  RECOMMENDATIONS AND PLAN:  A push enteroscopy is appropriate to try to identify and possibly treat the source of bleeding.  It is not really clear where he is bleeding from or what type of lesion.     Iva Boop, MD,FACG     CEG/MEDQ  D:  04/14/2010  T:  04/15/2010  Job:  [id number]  Electronically Signed by Stan Head MDFACG on 04/20/2010 07:09:19 PM

## 2010-04-21 NOTE — Procedures (Signed)
Summary: Colonoscopy  Patient: [name] Note: All result statuses are Final unless otherwise noted.  Tests: (1) Colonoscopy (COL)   COL Colonoscopy           DONE     [facility]     [address]     [place], [place]  [address]           COLONOSCOPY PROCEDURE REPORT           PATIENT:  Charles [name], Charles [name]  MR#:  [id number]     BIRTHDATE:  [birth year], 64 yrs. old  GENDER:  male     ENDOSCOPIST:  Wilhemina Bonito. Eda Keys, MD     REF. BY:  [facility],     PROCEDURE DATE:  04/05/2010     PROCEDURE:  Diagnostic Colonoscopy     ASA CLASS:  Class III     INDICATIONS:  heme positive stool, Anemia     MEDICATIONS:   Fentanyl 75 mcg IV, Versed 7.5 mg           DESCRIPTION OF PROCEDURE:   After the risks benefits and     alternatives of the procedure were thoroughly explained, informed     consent was obtained.  Digital rectal exam was performed and     revealed no abnormalities.   The Pentax Colonoscope [id number]     endoscope was introduced through the anus and advanced to the     cecum, which was identified by both the appendix and ileocecal     valve, without limitations.  The quality of the prep was good,     using Colyte.  The instrument was then slowly withdrawn as the     colon was fully examined.     <<PROCEDUREIMAGES>>           FINDINGS:  A normal appearing cecum, ileocecal valve, and     appendiceal orifice were identified. The ascending, hepatic     flexure, transverse, splenic flexure, descending, sigmoid colon,     and rectum appeared unremarkable.   Retroflexed views in the     rectum revealed internal hemorrhoids.    The scope was then     withdrawn from the patient and the procedure completed.           COMPLICATIONS:  None     ENDOSCOPIC IMPRESSION:     1) Normal colon     2) Internal hemorrhoids     RECOMMENDATIONS:     1) Continue current colorectal screening recommendations for     "routine risk" patients with a repeat colonoscopy in 10  years.     ______________________________     Wilhemina Bonito. Eda Keys, MD           CC:  [facility];   Denice Paradise,           n.     eSIGNED:   Wilhemina Bonito. Eda Keys at 04/05/2010 04:13 PM           Charles [name], [id number]  Note: An exclamation mark (!) indicates a result that was not dispersed into the flowsheet. Document Creation Date: 04/05/2010 4:13 PM _______________________________________________________________________  (1) Order result status: Final Collection or observation date-time: 04/05/2010 16:07 Requested date-time:  Receipt date-time:  Reported date-time:  Referring Physician:   Ordering Physician: Fransico Setters [phone]) Specimen Source:  Source: Launa Grill Order Number: [id number] Lab site:

## 2010-04-21 NOTE — Procedures (Signed)
Summary: Enteroscopy  Patient: [name] Note: All result statuses are Final unless otherwise noted.  Tests: (1) Upper Endoscopy (EGD)   EGD Upper Endoscopy       DONE     [facility]     [address]     [place], [place]  [address]           ENTEROSCOPY PROCEDURE REPORT           PATIENT:  Charles, [name]  MR#:  [id number]     BIRTHDATE:  06-Jan-1946, 64 yrs. old  GENDER:  male     ENDOSCOPIST:  Rachael Fee, MD     PROCEDURE DATE:  04/15/2010     PROCEDURE:  Enteroscopy with lesion ablation     ASA CLASS:  Class III     INDICATIONS:  persistent heme + anemia; no convincing source on     colonoscopy or EGD; recent capsule endoscopy showed bleeding from     proximal small bowel     MEDICATIONS:  Fentanyl 75 mcg IV, Versed 6 mg IV     TOPICAL ANESTHETIC:  Cetacaine Spray           DESCRIPTION OF PROCEDURE:   After the risks benefits and     alternatives of the procedure were thoroughly explained, informed     consent was obtained.  The EG-[id number] ([id number]) and [id number]     ([id number]) endoscope was introduced through the mouth and advanced     to the proximal jejunum, without limitations.  The instrument was     slowly withdrawn as the mucosa was fully examined.     <<PROCEDUREIMAGES>>     There were 3 small, cherry red AVMs in visualized small bowel.     None were actively bleeding.  These were all treated with APC, the     most distal AVM bleed during treatment (see image003, image004,     and image005).  A hiatal hernia was found. This was 2cm.     Otherwise the examination was normal (see image006 and image007).     Retroflexed views revealed no abnormalities.    The scope was then     withdrawn from the patient and the procedure completed.     COMPLICATIONS:  None           ENDOSCOPIC IMPRESSION:     1) Three typical appearing small bowel AVMs, all treated with     APC to destruction     2) 2cm hiatal hernia     3) Otherwise normal  examination           RECOMMENDATIONS:     Follow clinically.     Would not restart plavix unless absolutely needed for     cardiovascular disease.           ______________________________     Rachael Fee, MD           n.     eSIGNED:   Rachael Fee at 04/15/2010 11:43 AM           Aileen Fass, [id number]  Note: An exclamation mark (!) indicates a result that was not dispersed into the flowsheet. Document Creation Date: 04/15/2010 11:43 AM _______________________________________________________________________  (1) Order result status: Final Collection or observation date-time: 04/15/2010 11:36 Requested date-time:  Receipt date-time:  Reported date-time:  Referring Physician:   Ordering Physician: Rob Bunting [phone]) Specimen Source:  Source: Launa Grill Order Number: [id number] Lab site:

## 2010-04-21 NOTE — Procedures (Signed)
Summary: CAPSULE ENDOSCOPY REPORT    REFERRING PHYSICIAN: Rob Bunting, MD READ BY: Stan Head, MD   REASON FOR REFERRAL: 65 YO MALE HOSPITALIZED WITH ANEMIA, FE DEFICIENCY, HEME POSITIVE STOOLS. PT WITH PULM HTN/CHF. UNREVEALING EGD AND COLON.  PATIENT DATA: Height: 65.0 inches. Weight: 159 lbs. Waist: 32.0 onches. Build:Normal. Shelbie Proctor Passage Time: 0h 60m. Small Bowel Passage: Time: 5h 18m.  PROCEDURE INFO & FINDINGS: 1)COMPLETE STUDY, GOOD PREP 2)ACTIVE OOZING OF BLOOD IN THE PROXIMAL SMALL BOWEL, UNABLE TO IDENTIFY SOURCE. BLOOD FIRST SEEN ABOUT 35 MINUTES BEYOND FIRST DUODENAL IMAGE.  SUMMARY & RECOMMENDATIONS: PUSH ENTEROSCOPY IS APPROPRIATE TO IDENTIFY AND POSSIBLY TREAT THE BLEEDING.

## 2010-04-30 NOTE — Discharge Summary (Signed)
Charles [name], Charles [name]              ACCOUNT NO.:  [id number]  MEDICAL RECORD NO.:  [id number]          PATIENT TYPE:  INP  LOCATION:  3032                         FACILITY:  [facility]  PHYSICIAN:  Kathlen Mody, MD       DATE OF BIRTH:  07-26-1945  DATE OF ADMISSION:  03/24/2010 DATE OF DISCHARGE:  04/19/2010                              DISCHARGE SUMMARY   PRIMARY CARE PROVIDER:  MD at [facility].  DISCHARGE DIAGNOSES: 1. Acute on chronic systolic congestive heart failure with an ejection     fraction of 35%-40%. 2. Active gastrointestinal status post EGD and colonoscopy, status     post capsule endoscopy. 3. Pulmonary hypertension. 4. Chronic renal failure. 5. Blood loss anemia status post 4 units of blood transfusion. 6. Hyperkalemia. 7. Cocaine abuse. 8. Hypertension. 9. Hypercholesterolemia.  DISCHARGE MEDICATIONS: 1. Isosorbide mononitrate 60 mg half daily. 2. Nitroglycerin 0.4 mg of sublingual as needed. 3. Aspirin 81 mg daily. 4. Hydralazine 25 mg p.o. 3 times a day. 5. Amlodipine 10 mg daily. 6. Ferrous sulfate 325 mg twice a day. 7. Spironolactone 25 mg daily. 8. Simvastatin 40 mg daily. 9. Lasix 40 mg daily. 10.Coreg 3.125 mg twice a day. 11.Potassium chloride 10 mEq daily. 12.Hydralazine 25 mg p.o. 3 tablets daily.  CONSULTS:  Gastroenterology consult for GI bleed.  Hematology consult from Dr. Truett Perna for anemia.  PROCEDURES DONE:  The patient had an upper endoscopy, colonoscopy, small bowel capsule, endoscopy.  PERTINENT LABORATORY DATA:  CHEM-8 showed a hemoglobin of 9.5, hematocrit of 28.  Sodium of 139, potassium of 5.3, chloride of 109, glucose of 102, BUN of 38, creatinine of 1.8, bicarb of 22.  Point-of- care cardiac markers were negative.  Urine drug screen came positive for cocaine.  Next set of point-of-care cardiac markers were negative. Liver function tests showed that bilirubin of 0.6 for the total bilirubin of normal, elevated AST, lipase of  42.  Brain natriuretic peptide more than 3200.  Next set of point-of-care cardiac markers were negative.  Lactic acid was 1.7.  Next set of cardiac enzymes negative. Urinalysis showed negative procalcitonin was 0.11.  MRSA screen was negative.  TSH was 1.598.  Free T4 1.17.  CBC showed hemoglobin of 7.1, hematocrit of 23.6, MCV of 90.8.  CBC done January 6 showed a hemoglobin of 7.5, hematocrit of 24.5, hemoglobin A1c of 5.  Urine culture came back more than 100,000 colonies of coagulase negative staph.  Anemia panel, March 29, 2010, showed an iron level of 313, folate of 11.35, vitamin B12 of 508, ferritin of 56.  Stool for fecal occult blood came back positive.  Another fecal occult blood came positive.  Absolute reticulocyte count of 45.  HIV antibody nonreactive.  Erythropoietin level of 58.9, 24-hour protein level came back high, _________ 328. Kappa free light chains came back high with a load of 3.38.  Lambda free light chains came back high level of 3.18.  On the date of discharge, the patient's CBC showed a hemoglobin of 893, hematocrit of 25.4, platelets of 464, WBC count of 7.7.  Basic metabolic panel showed a sodium of 133, potassium of  5.1, creatinine of 1.55, BUN of 23, chloride of 94, bicarb of 25, glucose of 80.  RADIOLOGY:  The patient had chest x-ray on March 24, 2010, showed enlarged cardiac silhouette, which appears larger than on prior study, non-vascular congestion pattern with slight prominence of the interstitial markings, no consolidation or pleural effusion present.  CT abdomen and pelvis with contrast was done on the same day showed small pleural effusions, ascites and anasarca could indicate third spacing in the presence of volume overload or hypoproteinemia.  Free abdominal fluid in the patient may indicate an occult bowel pathology, but there is no secondary or primary evidence otherwise this is a small or large bowel pathology.  CT head without contrast  done on March 27, 2010, did not show any acute pathology.  V/Q scan on 20th came as 2012 was normal. No evidence of pulmonary emboli.  Chest x-ray on April 08, 2010, chronic cardiomegaly improved, acute abdomen done on April 16, 2010, no bowel obstruction, no free air, moderate amount of faeces in the right colon and ultrasound abdomen on April 18, 2010.  No abdominal pathology demonstrated.  BRIEF HOSPITAL COURSE: 1. This is a 65 year old with recent history of coronary bypass     suspected cardiomyopathy with an ejection fraction of 35 to 40%,     was admitted for chest pain, epigastric pain and the patient was     worked up for acute coronary syndrome, which was ruled out.  The     patient was treated for acute on chronic systolic heart failure     with an elevated BNP.  The patient was diuresed successfully and     his shortness of breath and chest pain has improved, but the     patient was noticed to have melena and GI was consulted.  The     patient underwent EGD and colonoscopy, which came back as negative.     During his hospitalization, he received about 3 units of PRBC     transfusion.  Gastroenterology was consulted to evaluate for his     anemia.  Hematology was also consulted to  evaluate for anemia.     Later, he was scheduled for a capsule endoscopy, which showed     proximal bowel bleeding most likely an AVM and he underwent an APC     of the AVM.  For his iron-deficiency anemia, he was started on     ferrous sulfate tablets.  Hematology was also consulted and     recommended ferrous sulfate tablets and if his anemia did not     improve in about 3-4 weeks, recommended Procrit as an outpatient. 2. Acute on chronic systolic failure, heart failure.  The patient was     diuresed successfully and his IV Lasix was changed to p.o. Lasix.     The patient also had an echocardiogram done, which showed an     increase in the pulmonary systolic pressures, which was thought  to     be secondary to possible left heart rate failure verses secondary     to cocaine use and also a V/Q scan was done to rule out pulmonary     emboli which V/Q scan came back negative for PE and Dopplers of the     lower extremities, also came back negative. 3. Acute on chronic renal failure, which has resolved and renal     insufficiency has been stable. 4. Cocaine abuse.  Counseling was done during hospital stay and  he was     recommended not to use cocaine and also was explained of the risks     of using cocaine with a history of cardiomyopathy.  PHYSICAL EXAMINATION:  VITAL SIGNS:  On the day of discharge the patient's vitals were temperature of 98.5, pulse of 72, blood pressure of 128/64, respirations 18 per minute, saturating by 95% on room air. GENERAL:  He was alert, afebrile, and oriented x3, comfortable. HEENT: No JVD. CARDIOVASCULAR:  S1, S2 heard. LUNGS:  Good air entry bilateral. ABDOMEN:  Soft, nontender, nondistended.  Good bowel sounds. EXTREMITIES:  Mild pedal edema.  On the day of discharge, the patient is hemodynamically stable to be discharged back to the shelter.  He was asked to follow up with hematologist, Dr. Truett Perna in about 3-4 weeks to check up on his anemia and also with the trial of a Procrit.  If his anemia has not improved and to follow with GI as needed for his anemia and to follow with his PMD at [facility] for his rest of the patient's medical issues.  Time spent with the patient with a care of the patient discharge and discharge summary about 45 minutes.          ______________________________ Kathlen Mody, MD     VA/MEDQ  D:  04/24/2010  T:  04/25/2010  Job:  [id number]  Electronically Signed by Kathlen Mody MD on 04/30/2010 03:14:28 PM

## 2010-05-09 ENCOUNTER — Inpatient Hospital Stay (HOSPITAL_COMMUNITY)
Admission: EM | Admit: 2010-05-09 | Discharge: 2010-05-19 | DRG: 291 | Disposition: A | Discharge status: Home / Self Care | Payer: Medicare Other | Attending: Internal Medicine | Admitting: Internal Medicine

## 2010-05-09 ENCOUNTER — Emergency Department (HOSPITAL_COMMUNITY): Payer: Medicare Other

## 2010-05-09 DIAGNOSIS — Z5900 Homelessness unspecified: Secondary | ICD-10-CM

## 2010-05-09 DIAGNOSIS — E871 Hypo-osmolality and hyponatremia: Secondary | ICD-10-CM | POA: Diagnosis present

## 2010-05-09 DIAGNOSIS — G4733 Obstructive sleep apnea (adult) (pediatric): Secondary | ICD-10-CM | POA: Diagnosis present

## 2010-05-09 DIAGNOSIS — Z951 Presence of aortocoronary bypass graft: Secondary | ICD-10-CM

## 2010-05-09 DIAGNOSIS — E785 Hyperlipidemia, unspecified: Secondary | ICD-10-CM | POA: Diagnosis present

## 2010-05-09 DIAGNOSIS — I129 Hypertensive chronic kidney disease with stage 1 through stage 4 chronic kidney disease, or unspecified chronic kidney disease: Secondary | ICD-10-CM | POA: Diagnosis present

## 2010-05-09 DIAGNOSIS — J9600 Acute respiratory failure, unspecified whether with hypoxia or hypercapnia: Secondary | ICD-10-CM | POA: Diagnosis present

## 2010-05-09 DIAGNOSIS — F17200 Nicotine dependence, unspecified, uncomplicated: Secondary | ICD-10-CM | POA: Diagnosis present

## 2010-05-09 DIAGNOSIS — F1410 Cocaine abuse, uncomplicated: Secondary | ICD-10-CM | POA: Diagnosis present

## 2010-05-09 DIAGNOSIS — D62 Acute posthemorrhagic anemia: Secondary | ICD-10-CM | POA: Diagnosis present

## 2010-05-09 DIAGNOSIS — I509 Heart failure, unspecified: Secondary | ICD-10-CM | POA: Diagnosis present

## 2010-05-09 DIAGNOSIS — I2510 Atherosclerotic heart disease of native coronary artery without angina pectoris: Secondary | ICD-10-CM | POA: Diagnosis present

## 2010-05-09 DIAGNOSIS — I279 Pulmonary heart disease, unspecified: Secondary | ICD-10-CM | POA: Diagnosis present

## 2010-05-09 DIAGNOSIS — N183 Chronic kidney disease, stage 3 unspecified: Secondary | ICD-10-CM | POA: Diagnosis present

## 2010-05-09 DIAGNOSIS — E876 Hypokalemia: Secondary | ICD-10-CM | POA: Diagnosis not present

## 2010-05-09 DIAGNOSIS — Z7982 Long term (current) use of aspirin: Secondary | ICD-10-CM

## 2010-05-09 DIAGNOSIS — I5043 Acute on chronic combined systolic (congestive) and diastolic (congestive) heart failure: Principal | ICD-10-CM | POA: Diagnosis present

## 2010-05-09 DIAGNOSIS — Z91199 Patient's noncompliance with other medical treatment and regimen due to unspecified reason: Secondary | ICD-10-CM

## 2010-05-09 DIAGNOSIS — N289 Disorder of kidney and ureter, unspecified: Secondary | ICD-10-CM | POA: Diagnosis present

## 2010-05-09 DIAGNOSIS — E875 Hyperkalemia: Secondary | ICD-10-CM | POA: Diagnosis present

## 2010-05-09 DIAGNOSIS — Z9119 Patient's noncompliance with other medical treatment and regimen: Secondary | ICD-10-CM

## 2010-05-09 HISTORY — DX: Other psychoactive substance abuse, uncomplicated: F19.10

## 2010-05-09 HISTORY — DX: Essential (primary) hypertension: I10

## 2010-05-09 HISTORY — DX: Atherosclerotic heart disease of native coronary artery without angina pectoris: I25.10

## 2010-05-09 LAB — DIFFERENTIAL
Eosinophils Relative: 2 % (ref 0–5)
Lymphocytes Relative: 16 % (ref 12–46)
Lymphs Abs: 1.1 10*3/uL (ref 0.7–4.0)
Monocytes Absolute: 0.5 10*3/uL (ref 0.1–1.0)
Monocytes Relative: 7 % (ref 3–12)
Neutro Abs: 5 10*3/uL (ref 1.7–7.7)

## 2010-05-09 LAB — COMPREHENSIVE METABOLIC PANEL
ALT: 20 U/L (ref 0–53)
Alkaline Phosphatase: 80 U/L (ref 39–117)
CO2: 21 mEq/L (ref 19–32)
Calcium: 8.5 mg/dL (ref 8.4–10.5)
Chloride: 106 mEq/L (ref 96–112)
GFR calc non Af Amer: >60 mL/min (ref >60)
Glucose, Bld: 89 mg/dL (ref 70–99)
Sodium: 135 mEq/L (ref 135–145)
Total Bilirubin: 0.6 mg/dL (ref 0.3–1.2)

## 2010-05-09 LAB — CBC
MCHC: 32.2 g/dL (ref 30.0–36.0)
MCV: 80.2 fL (ref 78.0–100.0)
Platelets: 306 10*3/uL (ref 150–400)
RDW: 17.9 % — ABNORMAL HIGH (ref 11.5–15.5)
WBC: 6.8 10*3/uL (ref 4.0–10.5)

## 2010-05-09 LAB — BRAIN NATRIURETIC PEPTIDE: Pro B Natriuretic peptide (BNP): 2147 pg/mL — ABNORMAL HIGH (ref 0.0–100.0)

## 2010-05-09 LAB — POCT CARDIAC MARKERS: Myoglobin, poc: 48.4 ng/mL (ref 12–200)

## 2010-05-10 LAB — CROSSMATCH: Unit division: 0

## 2010-05-10 LAB — BASIC METABOLIC PANEL
BUN: 14 mg/dL (ref 6–23)
CO2: 21 mEq/L (ref 19–32)
Chloride: 102 mEq/L (ref 96–112)
Creatinine, Ser: 1.25 mg/dL (ref 0.4–1.5)
Potassium: 3.9 mEq/L (ref 3.5–5.1)

## 2010-05-10 LAB — CBC
Hemoglobin: 8.5 g/dL — ABNORMAL LOW (ref 13.0–17.0)
MCH: 26.3 pg (ref 26.0–34.0)
MCV: 83 fL (ref 78.0–100.0)
RBC: 3.23 MIL/uL — ABNORMAL LOW (ref 4.22–5.81)
WBC: 7 10*3/uL (ref 4.0–10.5)

## 2010-05-10 LAB — CK TOTAL AND CKMB (NOT AT ARMC)
CK, MB: 2.6 ng/mL (ref 0.3–4.0)
Relative Index: INVALID (ref 0.0–2.5)
Relative Index: INVALID (ref 0.0–2.5)
Total CK: 66 U/L (ref 7–232)

## 2010-05-10 LAB — BRAIN NATRIURETIC PEPTIDE: Pro B Natriuretic peptide (BNP): 2378 pg/mL — ABNORMAL HIGH (ref 0.0–100.0)

## 2010-05-10 LAB — HEMOCCULT GUIAC POC 1CARD (OFFICE): Fecal Occult Bld: NEGATIVE

## 2010-05-10 LAB — RAPID URINE DRUG SCREEN, HOSP PERFORMED
Benzodiazepines: NOT DETECTED
Cocaine: POSITIVE — AB
Tetrahydrocannabinol: NOT DETECTED

## 2010-05-10 LAB — TROPONIN I
Troponin I: 0.06 ng/mL (ref 0.00–0.06)
Troponin I: 0.08 ng/mL — ABNORMAL HIGH (ref 0.00–0.06)

## 2010-05-11 ENCOUNTER — Inpatient Hospital Stay (HOSPITAL_COMMUNITY): Payer: Medicare Other

## 2010-05-11 DIAGNOSIS — F1410 Cocaine abuse, uncomplicated: Secondary | ICD-10-CM

## 2010-05-11 DIAGNOSIS — D649 Anemia, unspecified: Secondary | ICD-10-CM

## 2010-05-11 LAB — FERRITIN: Ferritin: 40 ng/mL (ref 22–322)

## 2010-05-11 LAB — CBC
Platelets: 334 10*3/uL (ref 150–400)
RBC: 3.21 MIL/uL — ABNORMAL LOW (ref 4.22–5.81)
RDW: 18.2 % — ABNORMAL HIGH (ref 11.5–15.5)
WBC: 5.5 10*3/uL (ref 4.0–10.5)

## 2010-05-11 LAB — CARDIAC PANEL(CRET KIN+CKTOT+MB+TROPI)
CK, MB: 2 ng/mL (ref 0.3–4.0)
Relative Index: INVALID (ref 0.0–2.5)
Total CK: 54 U/L (ref 7–232)
Troponin I: 0.03 ng/mL (ref 0.00–0.06)

## 2010-05-11 LAB — IRON AND TIBC
Iron: 64 ug/dL (ref 42–135)
TIBC: 384 ug/dL (ref 215–435)

## 2010-05-11 LAB — BASIC METABOLIC PANEL
BUN: 15 mg/dL (ref 6–23)
Chloride: 103 mEq/L (ref 96–112)
Creatinine, Ser: 1.31 mg/dL (ref 0.4–1.5)
GFR calc Af Amer: >60 mL/min (ref >60)
GFR calc non Af Amer: 55 mL/min — ABNORMAL LOW (ref >60)

## 2010-05-11 LAB — FOLATE: Folate: >20 ng/mL

## 2010-05-11 LAB — VITAMIN B12: Vitamin B-12: 276 pg/mL (ref 211–911)

## 2010-05-11 LAB — CREATININE CLEARANCE, URINE, 24 HOUR: Creatinine Clearance: UNDETERMINED mL/min (ref 75–125)

## 2010-05-12 LAB — RENAL FUNCTION PANEL
Albumin: 2.9 g/dL — ABNORMAL LOW (ref 3.5–5.2)
CO2: 28 mEq/L (ref 19–32)
Chloride: 98 mEq/L (ref 96–112)
Creatinine, Ser: 1.32 mg/dL (ref 0.4–1.5)
GFR calc Af Amer: >60 mL/min (ref >60)
GFR calc non Af Amer: 54 mL/min — ABNORMAL LOW (ref >60)
Sodium: 135 mEq/L (ref 135–145)

## 2010-05-12 LAB — CBC
HCT: 29.1 % — ABNORMAL LOW (ref 39.0–52.0)
MCH: 26.1 pg (ref 26.0–34.0)
MCV: 82.7 fL (ref 78.0–100.0)
RDW: 18.7 % — ABNORMAL HIGH (ref 11.5–15.5)
WBC: 6.2 10*3/uL (ref 4.0–10.5)

## 2010-05-12 LAB — CARDIAC PANEL(CRET KIN+CKTOT+MB+TROPI)
CK, MB: 2.1 ng/mL (ref 0.3–4.0)
CK, MB: 2 ng/mL (ref 0.3–4.0)
Total CK: 47 U/L (ref 7–232)
Troponin I: 0.02 ng/mL (ref 0.00–0.06)

## 2010-05-12 LAB — CREATININE CLEARANCE, URINE, 24 HOUR
Creatinine, Urine: 35.5 mg/dL
Creatinine: 1.32 mg/dL (ref 0.4–1.5)
Urine Total Volume-CRCL: 4200 mL

## 2010-05-12 LAB — BRAIN NATRIURETIC PEPTIDE: Pro B Natriuretic peptide (BNP): 948 pg/mL — ABNORMAL HIGH (ref 0.0–100.0)

## 2010-05-13 ENCOUNTER — Inpatient Hospital Stay (HOSPITAL_COMMUNITY): Payer: Medicare Other

## 2010-05-13 LAB — BASIC METABOLIC PANEL
CO2: 26 mEq/L (ref 19–32)
Chloride: 100 mEq/L (ref 96–112)
GFR calc Af Amer: 59 mL/min — ABNORMAL LOW (ref >60)
Glucose, Bld: 94 mg/dL (ref 70–99)
Sodium: 133 mEq/L — ABNORMAL LOW (ref 135–145)

## 2010-05-13 NOTE — Discharge Summary (Signed)
  [name], Charles [name]              ACCOUNT NO.:  [id number]  MEDICAL RECORD NO.:  [id number]           PATIENT TYPE:  I  LOCATION:  2006                         FACILITY:  [facility]  PHYSICIAN:  Kathlen Mody, MD       DATE OF BIRTH:  March 13, 1946  DATE OF ADMISSION:  05/09/2010 DATE OF DISCHARGE:                              DISCHARGE SUMMARY   ADDENDUM  MEDICATIONS: 1. Protonix 40 mg daily. 2. Depakote 250 mg daily. 3. Depakote 500 mg at bedtime. 4. Did not continue the patient on lisinopril as the patient had renal     insufficiency.          ______________________________ Kathlen Mody, MD     VA/MEDQ  D:  05/13/2010  T:  05/13/2010  Job:  [id number]  Electronically Signed by Kathlen Mody MD on 05/13/2010 09:50:47 AM

## 2010-05-14 LAB — WOUND CULTURE

## 2010-05-14 LAB — RENAL FUNCTION PANEL
BUN: 18 mg/dL (ref 6–23)
CO2: 28 mEq/L (ref 19–32)
Chloride: 100 mEq/L (ref 96–112)
Creatinine, Ser: 1.24 mg/dL (ref 0.4–1.5)
Glucose, Bld: 123 mg/dL — ABNORMAL HIGH (ref 70–99)
Potassium: 4.3 mEq/L (ref 3.5–5.1)

## 2010-05-14 LAB — CBC
HCT: 30.1 % — ABNORMAL LOW (ref 39.0–52.0)
MCH: 25.8 pg — ABNORMAL LOW (ref 26.0–34.0)
MCV: 82.7 fL (ref 78.0–100.0)
RBC: 3.64 MIL/uL — ABNORMAL LOW (ref 4.22–5.81)
WBC: 4.9 10*3/uL (ref 4.0–10.5)

## 2010-05-14 NOTE — H&P (Signed)
NAMEMarland [name]  PHAT, DALTON NO.:  [id number]  MEDICAL RECORD NO.:  [id number]           PATIENT TYPE:  E  LOCATION:  [facility]                         FACILITY:  [facility]  PHYSICIAN:  Vania Rea, M.D. DATE OF BIRTH:  [birth year]  DATE OF ADMISSION:  05/09/2010 DATE OF DISCHARGE:                             HISTORY & PHYSICAL   PRIMARY CARE PHYSICIAN:  Darius Bump, MD in [place].  CHIEF COMPLAINT:  Worsening shortness of breath and chest pain.  HISTORY OF PRESENT ILLNESS:  This is a 65 year old African American gentleman with multiple medical problems including hypertension, congestive heart failure, anemia, recently diagnosed as being due to AV malformations, also cocaine abuse, who was discharged from the Hospitalist Service on April 19, 2010, after an extensive hospital stay where he was evaluated for his chronic persistent anemia and GI bleed.  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  For reasons unclear, however, the patient has had no medication since discharge.  He says the medications was supposed to have been sent to him but he has not received, this may be due to the fact that the patient has actually moved resident since discharge.  He used to be tenant at home in [place] that has since undergone foreclosure and he is now living in a homeless shelter in [place].  The patient reports for the past week, he has been having progressive shortness of breath, left-sided chest pain, and a cough that he associates with worsening of his heart failure.  He has had no bloody nor black stool.  He has had no fever nor chills.  He continues to have lower extremity edema.  He last used cocaine 3 days ago by smoking it.  PAST MEDICAL HISTORY: 1. Congestive heart failure, hypertension, coronary artery disease     status  post CABG. 2. Cocaine abuse. 3. Hyperlipidemia. 4. Chronic anemia due to chronic GI bleed. 5. AV malformations status post cauterization therapy. 6. History of obstructive sleep apnea.  MEDICATIONS:  As noted, the patient has taken none of his medications since discharge 20 days ago because of his homelessness but the medications at discharge included: 1. Potassium chloride. 2. Coreg 3.125 mg twice daily. 3. Spironolactone 25 mg daily. 4. Simvastatin 40 mg at bedtime. 5. Sublingual nitroglycerin when necessary. 6. Imdur 30 mg daily. 7. Hydralazine 25 mg 3 times daily. 8. Lasix 40 mg daily. 9. Iron sulfate 1 tablet twice daily. 10.Enteric-coated aspirin 81 mg daily. 11.Amlodipine 10 mg daily.  ALLERGIES:  ACETAMINOPHEN and IBUPROFEN affects his liver and stomach.  SOCIAL HISTORY:  Cocaine abuse as noted above.  He says he uses it only occasionally, smokes about 1 cigarette per day.  Denies alcohol use.  He is on disability because of cardiac problems and he is currently homeless.  FAMILY HISTORY:  Significant for cardiac disease.REVIEW OF SYSTEMS:  Other than noted above, unremarkable.  PHYSICAL EXAMINATION:  GENERAL:  Pleasant middle-aged African American gentleman, sitting up in the stretcher, somewhat tachypneic.  He is on a nitroglycerin drip. VITAL  SIGNS:  His temperature is 98.9, his pulse is 101, respiratory rate originally 32, now 18, blood pressure 159/106.  He is saturating 100% on 2 liters. HEENT:  His pupils are round and equal.  Mucous membranes pale. Anicteric. NECK:  No cervical lymphadenopathy.  No thyromegaly or carotid bruit. CHEST:  He has bibasilar crackles.  No wheezing heard. CARDIOVASCULAR SYSTEM:  He has a gallop rhythm. ABDOMEN:  Obese.  He has tender hepatomegaly.  No flank dullness is elicited. EXTREMITIES:  He has 1+ edema bilaterally.  Dorsalis pedis pulses are 2+ bilaterally. CENTRAL NERVOUS SYSTEM:  Cranial nerves II-XII are grossly intact.   He has no focal lateralizing signs.  LABS:  His hemoglobin is 6.8, his white count is also 6.8, hematocrit 21.1, platelets 306.  His differential is unremarkable.  His sodium is 135, potassium is 3.8, chloride 106, CO2 21, glucose 89, BUN 12, creatinine 1.1.  His albumin is 3.0.  His liver functions unremarkable. His calcium is 8.5.  Beta-type natriuretic peptide is elevated to 2147. Portable chest x-ray shows cardiomegaly and pulmonary vascular congestion.  His EKG shows sinus tachycardia, incomplete right bundle- branch block but in my estimation, it is not significantly changed compared to his EKG of April 10, 2010.  ASSESSMENT: 1. Acute on chronic combined systolic and diastolic heart failure,     probably due to recent noncompliance. 2. Anemia, probably aggravated by fluid overload related to his     congestive heart failure. 3. Hypertensive urgency due to noncompliance as noted above. 4. Cocaine abuse, aggravating his chronic medical problems. 5. Coronary artery disease. 6. Hyperlipidemia. 7. Homelessness.  PLAN: 1. The patient has already been transfused 1 unit of packed red cells     and we agree with that.  His hemoglobin will probably increase as     he is diuresed. 2. We will give intravenous diuresis. 3. We will continue the nitroglycerin drip for management of his blood     pressure and therefore will admit him to the step-down unit. 4. We will resume his outpatient medications as prescribed at     discharge. 5. We will once again counsel this gentleman on the dangers of cocaine     abuse in particular with his chronic medical problems. 6. We will add a social worker consult to sort out how he can be     provided with his medications in his condition of homelessness. 7. Other plans as per orders.     Vania Rea, M.D.     LC/MEDQ  D:  05/09/2010  T:  05/09/2010  Job:  [id number]  cc:   Darius Bump, M.D.  Electronically Signed by Vania Rea M.D. on 05/14/2010 06:01:13 AM

## 2010-05-16 ENCOUNTER — Other Ambulatory Visit (HOSPITAL_COMMUNITY): Payer: Medicare Other

## 2010-05-16 ENCOUNTER — Inpatient Hospital Stay (HOSPITAL_COMMUNITY): Payer: Medicare Other

## 2010-05-16 ENCOUNTER — Other Ambulatory Visit: Payer: Self-pay | Admitting: Interventional Radiology

## 2010-05-16 ENCOUNTER — Encounter (HOSPITAL_COMMUNITY): Payer: Self-pay

## 2010-05-16 LAB — BASIC METABOLIC PANEL
BUN: 26 mg/dL — ABNORMAL HIGH (ref 6–23)
Chloride: 100 mEq/L (ref 96–112)
Creatinine, Ser: 1.45 mg/dL (ref 0.4–1.5)
GFR calc non Af Amer: 49 mL/min — ABNORMAL LOW (ref >60)
Glucose, Bld: 95 mg/dL (ref 70–99)

## 2010-05-16 LAB — APTT: aPTT: 36 seconds (ref 24–37)

## 2010-05-16 LAB — PROTIME-INR: INR: 1.1 (ref 0.00–1.49)

## 2010-05-16 LAB — CBC
HCT: 33.7 % — ABNORMAL LOW (ref 39.0–52.0)
Hemoglobin: 10.6 g/dL — ABNORMAL LOW (ref 13.0–17.0)
MCH: 26.3 pg (ref 26.0–34.0)
MCHC: 31.5 g/dL (ref 30.0–36.0)
MCV: 83.6 fL (ref 78.0–100.0)
Platelets: 516 10*3/uL — ABNORMAL HIGH (ref 150–400)
RBC: 4.03 MIL/uL — ABNORMAL LOW (ref 4.22–5.81)
RDW: 19.3 % — ABNORMAL HIGH (ref 11.5–15.5)

## 2010-05-16 LAB — CARDIAC PANEL(CRET KIN+CKTOT+MB+TROPI): CK, MB: 2 ng/mL (ref 0.3–4.0)

## 2010-05-16 LAB — MAGNESIUM: Magnesium: 2.1 mg/dL (ref 1.5–2.5)

## 2010-05-17 LAB — BASIC METABOLIC PANEL
BUN: 38 mg/dL — ABNORMAL HIGH (ref 6–23)
CO2: 24 mEq/L (ref 19–32)
CO2: 26 mEq/L (ref 19–32)
Calcium: 9.5 mg/dL (ref 8.4–10.5)
Calcium: 9.2 mg/dL (ref 8.4–10.5)
Chloride: 100 mEq/L (ref 96–112)
Creatinine, Ser: 2.01 mg/dL — ABNORMAL HIGH (ref 0.4–1.5)
GFR calc non Af Amer: 33 mL/min — ABNORMAL LOW (ref >60)
Glucose, Bld: 107 mg/dL — ABNORMAL HIGH (ref 70–99)
Glucose, Bld: 145 mg/dL — ABNORMAL HIGH (ref 70–99)
Sodium: 133 mEq/L — ABNORMAL LOW (ref 135–145)
Sodium: 133 mEq/L — ABNORMAL LOW (ref 135–145)

## 2010-05-17 LAB — CARDIAC PANEL(CRET KIN+CKTOT+MB+TROPI)
CK, MB: 2.2 ng/mL (ref 0.3–4.0)
Relative Index: INVALID (ref 0.0–2.5)
Relative Index: INVALID (ref 0.0–2.5)
Total CK: 44 U/L (ref 7–232)
Total CK: 41 U/L (ref 7–232)
Troponin I: 0.02 ng/mL (ref 0.00–0.06)

## 2010-05-17 LAB — CBC
HCT: 35.9 % — ABNORMAL LOW (ref 39.0–52.0)
Hemoglobin: 11.5 g/dL — ABNORMAL LOW (ref 13.0–17.0)
MCHC: 32 g/dL (ref 30.0–36.0)
RBC: 4.32 MIL/uL (ref 4.22–5.81)

## 2010-05-19 LAB — CBC
MCH: 26.3 pg (ref 26.0–34.0)
MCHC: 31.3 g/dL (ref 30.0–36.0)
Platelets: 509 10*3/uL — ABNORMAL HIGH (ref 150–400)
RDW: 18.8 % — ABNORMAL HIGH (ref 11.5–15.5)

## 2010-05-19 LAB — BASIC METABOLIC PANEL
BUN: 32 mg/dL — ABNORMAL HIGH (ref 6–23)
Calcium: 9 mg/dL (ref 8.4–10.5)
Creatinine, Ser: 1.44 mg/dL (ref 0.4–1.5)
GFR calc non Af Amer: 49 mL/min — ABNORMAL LOW (ref >60)
Glucose, Bld: 65 mg/dL — ABNORMAL LOW (ref 70–99)
Potassium: 4.6 mEq/L (ref 3.5–5.1)

## 2010-06-02 NOTE — Discharge Summary (Signed)
Charles [name], ASMAN              ACCOUNT NO.:  [id number]  MEDICAL RECORD NO.:  [id number]           PATIENT TYPE:  I  LOCATION:  3702                         FACILITY:  [facility]  PHYSICIAN:  Zannie Cove, MD     DATE OF BIRTH:  Apr 02, 1945  DATE OF ADMISSION:  05/09/2010 DATE OF DISCHARGE:  05/19/2010                              DISCHARGE SUMMARY   PRIMARY CARE PHYSICIAN:  None, to follow up at [facility].  DISCHARGE DIAGNOSES: 1. Acute-on-chronic systolic congestive heart failure exacerbation. 2. Malignant hypertension. 3. History of coronary artery disease status post coronary artery     bypass graft. 4. Polysubstance abuse, including cocaine. 5. Dyslipidemia. 6. History of obstructive sleep apnea. 7. Chronic anemia secondary to chronic subacute gastrointestinal bleed     from proximal small bowel arteriovenous malformations as well as     renal disease. 8. Chronic kidney disease stage III.  DIAGNOSTICS:  Chest x-ray on May 09, 2010 cardiomegaly with pulmonary vascular congestion.  Repeat chest x-ray same.  CT-guided bone marrow biopsy on May 16, 2010, showed hypercellular bone marrow with increased number of megakaryocytes and small benign-appearing lymphoid aggregates.  Flow cytometry does not show monoclonal B-cell population nor abnormal T-cell type.  There is no increase in blasts and no significant dyspoiesis noted.  In addition, stain show that iron storage was decreased but no __________.  CONSULTANTS: 1. Dr. Rob Bunting,  GI. 2. Dr. Thornton Papas, Hematology/Oncology.  HOSPITAL COURSE:  [name] is a 65 year old gentleman with history of multiple medical problems presented to the hospital with acute-on- chronic systolic CHF, worsening of his chronic anemia as well as malignant hypertension exacerbated by cocaine use. 1. Chronic anemia.  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  Subsequently, he was seen by Dr. Thornton Papas in consultation and underwent a bone marrow biopsy with     results as dictated above.  It was pretty much unremarkable in     detecting any other cause of his anemia and hence the consensus was     that this was likely from chronic kidney disease exacerbated by     subacute chronic blood loss anemia from small bowel AVMs.     Essentially, the plan is for the patient to follow up closely with     Dr. Truett Perna, have regular CBCs and/or Procrit depending on his     hemoglobin level. 2. History of CAD status post CABG, this is stable.  The patient is     advised to follow up with his cardiologist at [facility]. 3. Chronic kidney disease, stable. 4. History of  systolic and diastolic CHF, currently compensated,     continue his Coreg, Imdur, and hydralazine since he cannot be on an     ACE inhibitor due to his poor renal function. 5. Cocaine use.  The patient has been extensively counseled regarding     this.  He has already received supply of all his medications as     well as shelter and hence being discharged home today in a stable     condition to follow up with [facility] on June 23, 2010, and with     Dr. Truett Perna at the [facility] in 2 weeks for blood work and 4     weeks for follow up.     Zannie Cove, MD     PJ/MEDQ  D:  05/19/2010  T:  05/20/2010  Job:  [id number]  cc:   Leighton Roach. Truett Perna, M.D. [facility].  Electronically Signed by Zannie Cove  on 06/02/2010 05:19:29 PM

## 2010-06-08 LAB — VIRUS CULTURE

## 2010-06-13 ENCOUNTER — Encounter: Payer: Medicare Other | Admitting: Oncology
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
## 2010-06-24 LAB — BASIC METABOLIC PANEL
CO2: 29 mEq/L (ref 19–32)
CO2: 29 mEq/L (ref 19–32)
Calcium: 8.9 mg/dL (ref 8.4–10.5)
Calcium: 9.2 mg/dL (ref 8.4–10.5)
Chloride: 104 mEq/L (ref 96–112)
GFR calc Af Amer: 58 mL/min — ABNORMAL LOW (ref >60)
GFR calc non Af Amer: 47 mL/min — ABNORMAL LOW (ref >60)
Glucose, Bld: 164 mg/dL — ABNORMAL HIGH (ref 70–99)
Glucose, Bld: 96 mg/dL (ref 70–99)
Potassium: 4.1 mEq/L (ref 3.5–5.1)
Potassium: 4.1 mEq/L (ref 3.5–5.1)
Sodium: 137 mEq/L (ref 135–145)
Sodium: 138 mEq/L (ref 135–145)
Sodium: 142 mEq/L (ref 135–145)

## 2010-06-24 LAB — DIFFERENTIAL
Basophils Absolute: 0.1 10*3/uL (ref 0.0–0.1)
Eosinophils Relative: 4 % (ref 0–5)
Lymphocytes Relative: 22 % (ref 12–46)
Lymphs Abs: 1.5 10*3/uL (ref 0.7–4.0)
Monocytes Absolute: 0.5 10*3/uL (ref 0.1–1.0)
Neutro Abs: 4.5 10*3/uL (ref 1.7–7.7)

## 2010-06-24 LAB — CARDIAC PANEL(CRET KIN+CKTOT+MB+TROPI)
CK, MB: 5.2 ng/mL — ABNORMAL HIGH (ref 0.3–4.0)
Relative Index: INVALID (ref 0.0–2.5)
Relative Index: INVALID (ref 0.0–2.5)
Total CK: 63 U/L (ref 7–232)
Total CK: 94 U/L (ref 7–232)
Troponin I: 0.11 ng/mL — ABNORMAL HIGH (ref 0.00–0.06)

## 2010-06-24 LAB — COMPREHENSIVE METABOLIC PANEL
AST: 61 U/L — ABNORMAL HIGH (ref 0–37)
Albumin: 3.8 g/dL (ref 3.5–5.2)
Alkaline Phosphatase: 128 U/L — ABNORMAL HIGH (ref 39–117)
BUN: 21 mg/dL (ref 6–23)
GFR calc Af Amer: 58 mL/min — ABNORMAL LOW (ref >60)
Potassium: 3.7 mEq/L (ref 3.5–5.1)
Total Protein: 8.3 g/dL (ref 6.0–8.3)

## 2010-06-24 LAB — BLOOD GAS, ARTERIAL
Acid-Base Excess: 2.2 mmol/L — ABNORMAL HIGH (ref 0.0–2.0)
Acid-Base Excess: 3.1 mmol/L — ABNORMAL HIGH (ref 0.0–2.0)
Bicarbonate: 25.5 mEq/L — ABNORMAL HIGH (ref 20.0–24.0)
Delivery systems: POSITIVE
Drawn by: 213381
FIO2: 0.3 %
Inspiratory PAP: 12
O2 Saturation: 96 %
Patient temperature: 98.6
TCO2: 23 mmol/L (ref 0–100)
TCO2: 24 mmol/L (ref 0–100)
pCO2 arterial: 36.8 mmHg (ref 35.0–45.0)
pO2, Arterial: 83.2 mmHg (ref 80.0–100.0)

## 2010-06-24 LAB — CULTURE, BLOOD (ROUTINE X 2): Culture: NO GROWTH

## 2010-06-24 LAB — URINE MICROSCOPIC-ADD ON

## 2010-06-24 LAB — LIPID PANEL
Cholesterol: 165 mg/dL (ref 0–200)
LDL Cholesterol: 88 mg/dL (ref 0–99)
Total CHOL/HDL Ratio: 3.2 RATIO
Triglycerides: 130 mg/dL (ref <150)

## 2010-06-24 LAB — CBC
HCT: 38.5 % — ABNORMAL LOW (ref 39.0–52.0)
HCT: 43 % (ref 39.0–52.0)
Hemoglobin: 12.4 g/dL — ABNORMAL LOW (ref 13.0–17.0)
Platelets: 358 10*3/uL (ref 150–400)
Platelets: 383 10*3/uL (ref 150–400)
RDW: 15.5 % (ref 11.5–15.5)
RDW: 16.5 % — ABNORMAL HIGH (ref 11.5–15.5)
WBC: 5.9 10*3/uL (ref 4.0–10.5)
WBC: 6.9 10*3/uL (ref 4.0–10.5)

## 2010-06-24 LAB — URINALYSIS, ROUTINE W REFLEX MICROSCOPIC
Bilirubin Urine: NEGATIVE
Glucose, UA: NEGATIVE mg/dL
Hgb urine dipstick: NEGATIVE
Ketones, ur: NEGATIVE mg/dL
Protein, ur: 100 mg/dL — AB
pH: 5 (ref 5.0–8.0)

## 2010-06-24 LAB — POCT CARDIAC MARKERS
CKMB, poc: 2.2 ng/mL (ref 1.0–8.0)
Myoglobin, poc: 116 ng/mL (ref 12–200)

## 2010-06-24 LAB — APTT: aPTT: 27 seconds (ref 24–37)

## 2010-06-24 LAB — URINE CULTURE: Special Requests: NEGATIVE

## 2010-06-24 LAB — PROTIME-INR: INR: 1 (ref 0.00–1.49)

## 2010-06-24 LAB — POCT TOXICOLOGY PANEL

## 2011-11-29 ENCOUNTER — Other Ambulatory Visit: Payer: Self-pay

## 2011-11-29 ENCOUNTER — Encounter (HOSPITAL_COMMUNITY): Payer: Self-pay | Admitting: Emergency Medicine

## 2011-11-29 ENCOUNTER — Emergency Department (HOSPITAL_COMMUNITY)
Admission: EM | Admit: 2011-11-29 | Discharge: 2011-11-29 | Payer: Medicare Other | Attending: Emergency Medicine | Admitting: Emergency Medicine

## 2011-11-29 ENCOUNTER — Emergency Department (HOSPITAL_COMMUNITY): Payer: Medicare Other

## 2011-11-29 DIAGNOSIS — I25810 Atherosclerosis of coronary artery bypass graft(s) without angina pectoris: Secondary | ICD-10-CM | POA: Insufficient documentation

## 2011-11-29 DIAGNOSIS — R0602 Shortness of breath: Secondary | ICD-10-CM | POA: Insufficient documentation

## 2011-11-29 DIAGNOSIS — R079 Chest pain, unspecified: Secondary | ICD-10-CM | POA: Insufficient documentation

## 2011-11-29 LAB — TROPONIN I: Troponin I: <0.3 ng/mL (ref <0.30)

## 2011-11-29 LAB — CBC WITH DIFFERENTIAL/PLATELET
Eosinophils Absolute: 0.1 10*3/uL (ref 0.0–0.7)
Eosinophils Relative: 3 % (ref 0–5)
HCT: 33.7 % — ABNORMAL LOW (ref 39.0–52.0)
Lymphocytes Relative: 22 % (ref 12–46)
Lymphs Abs: 0.8 10*3/uL (ref 0.7–4.0)
MCH: 31.8 pg (ref 26.0–34.0)
MCV: 94.9 fL (ref 78.0–100.0)
Monocytes Absolute: 0.4 10*3/uL (ref 0.1–1.0)
Platelets: 233 10*3/uL (ref 150–400)
RBC: 3.55 MIL/uL — ABNORMAL LOW (ref 4.22–5.81)
RDW: 14.8 % (ref 11.5–15.5)
WBC: 3.7 10*3/uL — ABNORMAL LOW (ref 4.0–10.5)

## 2011-11-29 LAB — COMPREHENSIVE METABOLIC PANEL
BUN: 20 mg/dL (ref 6–23)
CO2: 23 mEq/L (ref 19–32)
Calcium: 9.2 mg/dL (ref 8.4–10.5)
Creatinine, Ser: 1.35 mg/dL (ref 0.50–1.35)
GFR calc Af Amer: 61 mL/min — ABNORMAL LOW (ref >90)
GFR calc non Af Amer: 53 mL/min — ABNORMAL LOW (ref >90)
Glucose, Bld: 78 mg/dL (ref 70–99)
Sodium: 137 mEq/L (ref 135–145)
Total Protein: 7.5 g/dL (ref 6.0–8.3)

## 2011-11-29 MED ORDER — NITROGLYCERIN 0.4 MG SL SUBL
0.4000 mg | SUBLINGUAL_TABLET | SUBLINGUAL | Status: AC | PRN
  Administered 2011-11-29 (×3): 0.4 mg via SUBLINGUAL

## 2011-11-29 MED ORDER — ASPIRIN 81 MG PO CHEW
324.0000 mg | CHEWABLE_TABLET | Freq: Once | ORAL | Status: AC
  Administered 2011-11-29: 324 mg via ORAL
  Filled 2011-11-29: qty 4

## 2011-11-29 MED ORDER — MORPHINE SULFATE 2 MG/ML IJ SOLN
2.0000 mg | Freq: Once | INTRAMUSCULAR | Status: AC
  Administered 2011-11-29: 2 mg via INTRAVENOUS
  Filled 2011-11-29: qty 1

## 2011-11-29 NOTE — ED Notes (Signed)
Arrive via [employer]. Per [facility], patient was in process of booking at [facility], blacked out, complained of chest pain. History of Cardiac Bypass, MI

## 2011-11-29 NOTE — ED Provider Notes (Signed)
History     CSN: [id number]  Arrival date & time 11/29/11  1615   First MD Initiated Contact with Patient 11/29/11 1657      Chief Complaint  Patient presents with  . Chest Pain    (Consider location/radiation/quality/duration/timing/severity/associated sxs/prior treatment) HPI Comments: Patient has history of CABG in 2009 at [facility].  Was being booked at [place] jail, developed pressure in his chest and had some sort of an episode where he doubled over in pain.  He was transported here by ems for this.    Patient is a 66 y.o. male presenting with chest pain. The history is provided by the patient.  Chest Pain The chest pain began 1 - 2 hours ago. Chest pain occurs constantly. The chest pain is unchanged. The pain is associated with stress. The severity of the pain is severe. The quality of the pain is described as pressure-like. The pain does not radiate. Chest pain is worsened by stress. Pertinent negatives for primary symptoms include no fever.     Past Medical History  Diagnosis Date  . Anemia   . Hypertension   . Systolic heart failure   . CAD (coronary artery disease)   . S/P CABG (coronary artery bypass graft)   . Dyslipidemia   . Polysubstance abuse     History reviewed. No pertinent past surgical history.  History reviewed. No pertinent family history.  History  Substance Use Topics  . Smoking status: Former Games developer  . Smokeless tobacco: Not on file  . Alcohol Use: No      Review of Systems  Constitutional: Negative for fever.  Cardiovascular: Positive for chest pain.  All other systems reviewed and are negative.    Allergies  Motrin and Tylenol  Home Medications  No current outpatient prescriptions on file.  BP 176/95  Pulse 71  Temp 98.1 F (36.7 C) (Oral)  Resp 19  SpO2 98%  Physical Exam  Nursing note and vitals reviewed. Constitutional: He is oriented to person, place, and time. He appears well-developed and well-nourished. No  distress.  HENT:  Head: Normocephalic and atraumatic.  Mouth/Throat: Oropharynx is clear and moist.  Neck: Normal range of motion. Neck supple.  Cardiovascular: Normal rate and regular rhythm.   No murmur heard. Pulmonary/Chest: Effort normal and breath sounds normal. No respiratory distress. He has no wheezes.  Abdominal: Soft. Bowel sounds are normal. He exhibits no distension. There is no tenderness.  Musculoskeletal: Normal range of motion.  Neurological: He is alert and oriented to person, place, and time.  Skin: Skin is warm and dry. He is not diaphoretic.    ED Course  Procedures (including critical care time)   Labs Reviewed  CBC WITH DIFFERENTIAL  COMPREHENSIVE METABOLIC PANEL  TROPONIN I   No results found.   No diagnosis found.   Date: 11/29/2011  Rate: 69  Rhythm: normal sinus rhythm  QRS Axis: normal  Intervals: normal  ST/T Wave abnormalities: nonspecific T wave changes  Conduction Disutrbances:none  Narrative Interpretation:   Old EKG Reviewed: unchanged    MDM  The patient presents quite dramatically with chest pain after being booked into jail.  The ekg and two troponins are both negative.  If these symptoms were cardiac in nature and this severe, there would almost certainly be ekg changes or elevated enzymes.  He will be discharged to the custody of the authorities.          Geoffery Lyons, MD 11/30/11 [phone]

## 2012-06-14 ENCOUNTER — Encounter (HOSPITAL_COMMUNITY): Payer: Self-pay | Admitting: Emergency Medicine

## 2012-06-14 ENCOUNTER — Emergency Department (HOSPITAL_COMMUNITY): Payer: Medicare Other

## 2012-06-14 ENCOUNTER — Observation Stay (HOSPITAL_COMMUNITY)
Admission: EM | Admit: 2012-06-14 | Discharge: 2012-06-16 | Disposition: A | Discharge status: Home / Self Care | Payer: Medicare Other | Attending: Internal Medicine | Admitting: Internal Medicine

## 2012-06-14 DIAGNOSIS — I701 Atherosclerosis of renal artery: Secondary | ICD-10-CM | POA: Insufficient documentation

## 2012-06-14 DIAGNOSIS — M5136 Other intervertebral disc degeneration, lumbar region: Secondary | ICD-10-CM | POA: Insufficient documentation

## 2012-06-14 DIAGNOSIS — R079 Chest pain, unspecified: Principal | ICD-10-CM

## 2012-06-14 DIAGNOSIS — G40909 Epilepsy, unspecified, not intractable, without status epilepticus: Secondary | ICD-10-CM

## 2012-06-14 DIAGNOSIS — Z5900 Homelessness unspecified: Secondary | ICD-10-CM

## 2012-06-14 DIAGNOSIS — Z951 Presence of aortocoronary bypass graft: Secondary | ICD-10-CM | POA: Insufficient documentation

## 2012-06-14 DIAGNOSIS — I2589 Other forms of chronic ischemic heart disease: Secondary | ICD-10-CM | POA: Insufficient documentation

## 2012-06-14 DIAGNOSIS — K219 Gastro-esophageal reflux disease without esophagitis: Secondary | ICD-10-CM | POA: Insufficient documentation

## 2012-06-14 DIAGNOSIS — I5022 Chronic systolic (congestive) heart failure: Secondary | ICD-10-CM

## 2012-06-14 DIAGNOSIS — I739 Peripheral vascular disease, unspecified: Secondary | ICD-10-CM

## 2012-06-14 DIAGNOSIS — I2510 Atherosclerotic heart disease of native coronary artery without angina pectoris: Secondary | ICD-10-CM | POA: Diagnosis present

## 2012-06-14 DIAGNOSIS — N183 Chronic kidney disease, stage 3 unspecified: Secondary | ICD-10-CM

## 2012-06-14 DIAGNOSIS — Z7251 High risk heterosexual behavior: Secondary | ICD-10-CM

## 2012-06-14 DIAGNOSIS — I2789 Other specified pulmonary heart diseases: Secondary | ICD-10-CM | POA: Insufficient documentation

## 2012-06-14 DIAGNOSIS — D509 Iron deficiency anemia, unspecified: Secondary | ICD-10-CM

## 2012-06-14 DIAGNOSIS — E119 Type 2 diabetes mellitus without complications: Secondary | ICD-10-CM

## 2012-06-14 DIAGNOSIS — I200 Unstable angina: Secondary | ICD-10-CM | POA: Diagnosis present

## 2012-06-14 DIAGNOSIS — R7303 Prediabetes: Secondary | ICD-10-CM | POA: Insufficient documentation

## 2012-06-14 DIAGNOSIS — I129 Hypertensive chronic kidney disease with stage 1 through stage 4 chronic kidney disease, or unspecified chronic kidney disease: Secondary | ICD-10-CM | POA: Insufficient documentation

## 2012-06-14 DIAGNOSIS — I255 Ischemic cardiomyopathy: Secondary | ICD-10-CM

## 2012-06-14 DIAGNOSIS — Z8249 Family history of ischemic heart disease and other diseases of the circulatory system: Secondary | ICD-10-CM

## 2012-06-14 DIAGNOSIS — I272 Pulmonary hypertension, unspecified: Secondary | ICD-10-CM

## 2012-06-14 DIAGNOSIS — F1910 Other psychoactive substance abuse, uncomplicated: Secondary | ICD-10-CM

## 2012-06-14 DIAGNOSIS — K922 Gastrointestinal hemorrhage, unspecified: Secondary | ICD-10-CM

## 2012-06-14 DIAGNOSIS — I10 Essential (primary) hypertension: Secondary | ICD-10-CM

## 2012-06-14 HISTORY — DX: Other intervertebral disc degeneration, lumbar region without mention of lumbar back pain or lower extremity pain: M51.369

## 2012-06-14 HISTORY — DX: Ischemic cardiomyopathy: I25.5

## 2012-06-14 HISTORY — DX: Chronic kidney disease, stage 3 (moderate): N18.3

## 2012-06-14 HISTORY — DX: Pulmonary hypertension, unspecified: I27.20

## 2012-06-14 HISTORY — DX: Angiodysplasia of colon without hemorrhage: K55.20

## 2012-06-14 HISTORY — DX: Obstructive sleep apnea (adult) (pediatric): G47.33

## 2012-06-14 HISTORY — DX: Chronic kidney disease, stage 3 unspecified: N18.30

## 2012-06-14 HISTORY — DX: Family history of ischemic heart disease and other diseases of the circulatory system: Z82.49

## 2012-06-14 HISTORY — DX: Atherosclerosis of renal artery: I70.1

## 2012-06-14 HISTORY — DX: Other intervertebral disc degeneration, lumbar region: M51.36

## 2012-06-14 HISTORY — DX: Iron deficiency anemia, unspecified: D50.9

## 2012-06-14 HISTORY — DX: Peripheral vascular disease, unspecified: I73.9

## 2012-06-14 HISTORY — DX: Hyperlipidemia, unspecified: E78.5

## 2012-06-14 HISTORY — DX: Epilepsy, unspecified, not intractable, without status epilepticus: G40.909

## 2012-06-14 LAB — IRON AND TIBC: TIBC: 425 ug/dL (ref 215–435)

## 2012-06-14 LAB — RAPID URINE DRUG SCREEN, HOSP PERFORMED
Barbiturates: NOT DETECTED
Benzodiazepines: NOT DETECTED
Cocaine: NOT DETECTED
Tetrahydrocannabinol: NOT DETECTED

## 2012-06-14 LAB — CREATININE, SERUM
GFR calc Af Amer: 51 mL/min — ABNORMAL LOW (ref >90)
GFR calc non Af Amer: 44 mL/min — ABNORMAL LOW (ref >90)

## 2012-06-14 LAB — HEPATIC FUNCTION PANEL
ALT: 13 U/L (ref 0–53)
Bilirubin, Direct: <0.1 mg/dL (ref 0.0–0.3)
Total Protein: 7 g/dL (ref 6.0–8.3)

## 2012-06-14 LAB — URINALYSIS, ROUTINE W REFLEX MICROSCOPIC
Bilirubin Urine: NEGATIVE
Glucose, UA: NEGATIVE mg/dL
Hgb urine dipstick: NEGATIVE
Ketones, ur: NEGATIVE mg/dL
pH: 5 (ref 5.0–8.0)

## 2012-06-14 LAB — BASIC METABOLIC PANEL
BUN: 27 mg/dL — ABNORMAL HIGH (ref 6–23)
CO2: 23 mEq/L (ref 19–32)
Calcium: 8.6 mg/dL (ref 8.4–10.5)
Creatinine, Ser: 1.6 mg/dL — ABNORMAL HIGH (ref 0.50–1.35)

## 2012-06-14 LAB — FERRITIN: Ferritin: 50 ng/mL (ref 22–322)

## 2012-06-14 LAB — POCT I-STAT TROPONIN I: Troponin i, poc: 0.02 ng/mL (ref 0.00–0.08)

## 2012-06-14 LAB — CBC
HCT: 29.5 % — ABNORMAL LOW (ref 39.0–52.0)
Hemoglobin: 9.5 g/dL — ABNORMAL LOW (ref 13.0–17.0)
RBC: 3.51 MIL/uL — ABNORMAL LOW (ref 4.22–5.81)
WBC: 3.5 10*3/uL — ABNORMAL LOW (ref 4.0–10.5)

## 2012-06-14 LAB — LIPID PANEL
HDL: 43 mg/dL (ref >39)
LDL Cholesterol: 48 mg/dL (ref 0–99)
Triglycerides: 85 mg/dL (ref <150)

## 2012-06-14 LAB — URINE MICROSCOPIC-ADD ON

## 2012-06-14 LAB — RETICULOCYTES
RBC.: 3.52 MIL/uL — ABNORMAL LOW (ref 4.22–5.81)
Retic Count, Absolute: 42.2 10*3/uL (ref 19.0–186.0)

## 2012-06-14 LAB — CBC WITH DIFFERENTIAL/PLATELET
Basophils Absolute: 0 10*3/uL (ref 0.0–0.1)
Basophils Relative: 0 % (ref 0–1)
Eosinophils Relative: 6 % — ABNORMAL HIGH (ref 0–5)
HCT: 29 % — ABNORMAL LOW (ref 39.0–52.0)
Hemoglobin: 9.3 g/dL — ABNORMAL LOW (ref 13.0–17.0)
MCHC: 32.1 g/dL (ref 30.0–36.0)
MCV: 85.3 fL (ref 78.0–100.0)
Monocytes Absolute: 0.2 10*3/uL (ref 0.1–1.0)
Monocytes Relative: 6 % (ref 3–12)
RDW: 17.4 % — ABNORMAL HIGH (ref 11.5–15.5)

## 2012-06-14 LAB — HEMOGLOBIN A1C
Hgb A1c MFr Bld: 5.5 % (ref <5.7)
Mean Plasma Glucose: 111 mg/dL (ref <117)

## 2012-06-14 LAB — PRO B NATRIURETIC PEPTIDE: Pro B Natriuretic peptide (BNP): 613.3 pg/mL — ABNORMAL HIGH (ref 0–125)

## 2012-06-14 LAB — TSH: TSH: 2.413 u[IU]/mL (ref 0.350–4.500)

## 2012-06-14 MED ORDER — ASPIRIN 81 MG PO CHEW
324.0000 mg | CHEWABLE_TABLET | ORAL | Status: AC
  Administered 2012-06-14: 324 mg via ORAL
  Filled 2012-06-14: qty 1

## 2012-06-14 MED ORDER — FUROSEMIDE 40 MG PO TABS
40.0000 mg | ORAL_TABLET | Freq: Every day | ORAL | Status: DC
  Administered 2012-06-14 – 2012-06-15 (×2): 40 mg via ORAL
  Filled 2012-06-14 (×2): qty 1

## 2012-06-14 MED ORDER — CHLORHEXIDINE GLUCONATE CLOTH 2 % EX PADS
6.0000 | MEDICATED_PAD | Freq: Every day | CUTANEOUS | Status: DC
  Administered 2012-06-15 – 2012-06-16 (×2): 6 via TOPICAL

## 2012-06-14 MED ORDER — SODIUM CHLORIDE 0.9 % IJ SOLN: 3.0000 mL | INTRAMUSCULAR | Status: DC | PRN

## 2012-06-14 MED ORDER — CITALOPRAM HYDROBROMIDE 20 MG PO TABS
30.0000 mg | ORAL_TABLET | Freq: Every day | ORAL | Status: DC
  Administered 2012-06-14 – 2012-06-15 (×2): 30 mg via ORAL
  Filled 2012-06-14 (×4): qty 1

## 2012-06-14 MED ORDER — PANTOPRAZOLE SODIUM 40 MG PO TBEC
40.0000 mg | DELAYED_RELEASE_TABLET | Freq: Two times a day (BID) | ORAL | Status: DC
  Administered 2012-06-14 – 2012-06-16 (×4): 40 mg via ORAL
  Filled 2012-06-14 (×4): qty 1

## 2012-06-14 MED ORDER — ATORVASTATIN CALCIUM 20 MG PO TABS
20.0000 mg | ORAL_TABLET | Freq: Every day | ORAL | Status: DC
  Administered 2012-06-14 – 2012-06-15 (×2): 20 mg via ORAL
  Filled 2012-06-14 (×4): qty 1

## 2012-06-14 MED ORDER — MORPHINE SULFATE 2 MG/ML IJ SOLN: 2.0000 mg | INTRAMUSCULAR | Status: DC | PRN

## 2012-06-14 MED ORDER — ASPIRIN EC 81 MG PO TBEC
81.0000 mg | DELAYED_RELEASE_TABLET | Freq: Every day | ORAL | Status: DC
  Administered 2012-06-15 – 2012-06-16 (×2): 81 mg via ORAL
  Filled 2012-06-14 (×2): qty 1

## 2012-06-14 MED ORDER — NITROGLYCERIN 0.4 MG SL SUBL: 0.4000 mg | SUBLINGUAL_TABLET | SUBLINGUAL | Status: DC | PRN

## 2012-06-14 MED ORDER — DIVALPROEX SODIUM 500 MG PO DR TAB
500.0000 mg | DELAYED_RELEASE_TABLET | Freq: Two times a day (BID) | ORAL | Status: DC
  Administered 2012-06-14 – 2012-06-16 (×4): 500 mg via ORAL
  Filled 2012-06-14 (×6): qty 1

## 2012-06-14 MED ORDER — SODIUM CHLORIDE 0.9 % IV SOLN: 250.0000 mL | INTRAVENOUS | Status: DC | PRN

## 2012-06-14 MED ORDER — MORPHINE SULFATE 4 MG/ML IJ SOLN
4.0000 mg | Freq: Once | INTRAMUSCULAR | Status: AC
  Administered 2012-06-14: 4 mg via INTRAVENOUS
  Filled 2012-06-14: qty 1

## 2012-06-14 MED ORDER — MUPIROCIN 2 % EX OINT
1.0000 "application " | TOPICAL_OINTMENT | Freq: Two times a day (BID) | CUTANEOUS | Status: DC
  Administered 2012-06-14 – 2012-06-16 (×4): 1 via NASAL
  Filled 2012-06-14 (×2): qty 22

## 2012-06-14 MED ORDER — GI COCKTAIL ~~LOC~~
30.0000 mL | Freq: Once | ORAL | Status: AC
  Administered 2012-06-14: 30 mL via ORAL
  Filled 2012-06-14: qty 30

## 2012-06-14 MED ORDER — ASPIRIN 300 MG RE SUPP
300.0000 mg | RECTAL | Status: AC
  Filled 2012-06-14: qty 1

## 2012-06-14 MED ORDER — ONDANSETRON HCL 4 MG/2ML IJ SOLN: 4.0000 mg | Freq: Four times a day (QID) | INTRAMUSCULAR | Status: DC | PRN

## 2012-06-14 MED ORDER — SODIUM CHLORIDE 0.9 % IJ SOLN
3.0000 mL | Freq: Two times a day (BID) | INTRAMUSCULAR | Status: DC
  Administered 2012-06-14 – 2012-06-15 (×3): 3 mL via INTRAVENOUS

## 2012-06-14 MED ORDER — FERROUS SULFATE 325 (65 FE) MG PO TABS
325.0000 mg | ORAL_TABLET | Freq: Two times a day (BID) | ORAL | Status: DC
Start: 2012-06-15 — End: 2012-06-16
  Administered 2012-06-15 – 2012-06-16 (×3): 325 mg via ORAL
  Filled 2012-06-14 (×6): qty 1

## 2012-06-14 MED ORDER — HYDRALAZINE HCL 50 MG PO TABS
100.0000 mg | ORAL_TABLET | Freq: Three times a day (TID) | ORAL | Status: DC
  Administered 2012-06-14 – 2012-06-16 (×5): 100 mg via ORAL
  Filled 2012-06-14 (×10): qty 2

## 2012-06-14 MED ORDER — HYDRALAZINE HCL 100 MG PO TABS: 100.0000 mg | ORAL_TABLET | Freq: Three times a day (TID) | ORAL | Status: DC

## 2012-06-14 MED ORDER — HEPARIN SODIUM (PORCINE) 5000 UNIT/ML IJ SOLN
5000.0000 [IU] | Freq: Three times a day (TID) | INTRAMUSCULAR | Status: DC
  Administered 2012-06-14 – 2012-06-15 (×3): 5000 [IU] via SUBCUTANEOUS
  Filled 2012-06-14 (×9): qty 1

## 2012-06-14 MED ORDER — CLONIDINE HCL 0.2 MG PO TABS
0.2000 mg | ORAL_TABLET | Freq: Two times a day (BID) | ORAL | Status: DC
  Administered 2012-06-14 – 2012-06-16 (×4): 0.2 mg via ORAL
  Filled 2012-06-14 (×6): qty 1

## 2012-06-14 MED ORDER — ONDANSETRON HCL 4 MG/2ML IJ SOLN
4.0000 mg | Freq: Once | INTRAMUSCULAR | Status: AC
  Administered 2012-06-14: 4 mg via INTRAVENOUS
  Filled 2012-06-14: qty 2

## 2012-06-14 NOTE — ED Provider Notes (Signed)
Medical screening examination/treatment/procedure(s) were conducted as a shared visit with non-physician practitioner(s) and myself.  I personally evaluated the patient during the encounter  Pt with history of CABG, is homeless and has no local physicians. Presents with chest pain. EKG, Trop CXR neg. Will admit for rule out and to establish with local doctors for followup.   Granvil Djordjevic B. Bernette Mayers, MD 06/14/12 1304

## 2012-06-14 NOTE — ED Notes (Signed)
Pt states that chest pain is 6/10 now he states that he feels better

## 2012-06-14 NOTE — ED Notes (Signed)
MD at bedside. 

## 2012-06-14 NOTE — ED Provider Notes (Signed)
History     CSN: [id number]  Arrival date & time 06/14/12  0802   First MD Initiated Contact with Patient 06/14/12 0813      Chief Complaint  Patient presents with  . Chest Pain    (Consider location/radiation/quality/duration/timing/severity/associated sxs/prior treatment) HPI Comments: 67 y.o. Male presents from homeless shelter complaining about chest pain that started after breakfast approx 7am. Pt describes as stabbing, 9/10, coming and going, not pleuritic, radiating down toward his stomach.  Admits dizziness, nausea.  Denies vomiting.   PMHx significant for 2 MIs, CABG (2009), CHF, DM, anemia, polysubstance abuse  Pt was given 324 baby asa and 1 SL nitro on ambulance without relief.   Patient is a 67 y.o. male presenting with chest pain.  Chest Pain Associated symptoms: back pain, dizziness, nausea and numbness   Associated symptoms: no diaphoresis, no fever, no headache, no shortness of breath, not vomiting and no weakness     Past Medical History  Diagnosis Date  . Anemia   . Hypertension   . Systolic heart failure   . CAD (coronary artery disease)   . S/P CABG (coronary artery bypass graft)   . Dyslipidemia   . Polysubstance abuse     History reviewed. No pertinent past surgical history.  History reviewed. No pertinent family history.  History  Substance Use Topics  . Smoking status: Former Games developer  . Smokeless tobacco: Not on file  . Alcohol Use: No      Review of Systems  Constitutional: Negative for fever and diaphoresis.  HENT: Negative for neck pain and neck stiffness.   Eyes: Negative for visual disturbance.  Respiratory: Negative for shortness of breath.   Cardiovascular: Positive for chest pain. Negative for leg swelling.       Left sided radiating to epigastric region  Gastrointestinal: Positive for nausea. Negative for vomiting, diarrhea and constipation.  Musculoskeletal: Positive for back pain. Negative for gait problem.       Chronic  back pain  Skin: Negative for rash.  Neurological: Positive for dizziness and numbness. Negative for weakness, light-headedness and headaches.       Finger tips numb on right side    Allergies  Motrin and Tylenol  Home Medications   Current Outpatient Rx  Name  Route  Sig  Dispense  Refill  . amLODipine (NORVASC) 2.5 MG tablet   Oral   Take 2.5 mg by mouth daily.         [name] atorvastatin (LIPITOR) 20 MG tablet   Oral   Take 20 mg by mouth at bedtime.         . carvedilol (COREG) 12.5 MG tablet   Oral   Take 12.5 mg by mouth 2 (two) times daily with a meal.         . citalopram (CELEXA) 20 MG tablet   Oral   Take 30 mg by mouth at bedtime.         . cloNIDine (CATAPRES) 0.2 MG tablet   Oral   Take 0.2 mg by mouth 2 (two) times daily.         . divalproex (DEPAKOTE) 500 MG DR tablet   Oral   Take 500 mg by mouth 2 (two) times daily.         . ferrous sulfate 325 (65 FE) MG tablet   Oral   Take 325 mg by mouth 2 (two) times daily with a meal.         . furosemide (LASIX) 40  MG tablet   Oral   Take 40 mg by mouth daily.         . hydrALAZINE (APRESOLINE) 100 MG tablet   Oral   Take 100 mg by mouth every 8 (eight) hours.         [name] lisinopril (PRINIVIL,ZESTRIL) 40 MG tablet   Oral   Take 40 mg by mouth daily.         . pantoprazole (PROTONIX) 40 MG tablet   Oral   Take 40 mg by mouth 2 (two) times daily.         . phenytoin (DILANTIN) 100 MG ER capsule   Oral   Take 200-300 mg by mouth 2 (two) times daily. Take 2 capsules by mouth in the morning and 3 capsules by mouth at bedtime.         . potassium chloride (K-DUR,KLOR-CON) 10 MEQ tablet   Oral   Take 10 mEq by mouth daily.         [name] spironolactone (ALDACTONE) 50 MG tablet   Oral   Take 50 mg by mouth daily.           BP 96/52  Temp(Src) 97.5 F (36.4 C) (Oral)  Resp 26  SpO2 97%  Physical Exam  Nursing note and vitals reviewed. Constitutional: He is oriented to  person, place, and time. He appears well-nourished. No distress.  HENT:  Head: Normocephalic and atraumatic.  Eyes: Conjunctivae and EOM are normal.  Neck: Normal range of motion. Neck supple.  No meningeal signs  Cardiovascular: Normal rate, regular rhythm, normal heart sounds and intact distal pulses.   Pulmonary/Chest: Effort normal and breath sounds normal. No respiratory distress. He has no wheezes. He has no rales. He exhibits tenderness.  Abdominal: Soft. Bowel sounds are normal. He exhibits no distension. There is no tenderness.  Musculoskeletal: Normal range of motion. He exhibits no edema and no tenderness.  5/5 muscle strength. Good grip strength  Neurological: He is alert and oriented to person, place, and time. No cranial nerve deficit.  No focal deficits  Skin: Skin is warm and dry. He is not diaphoretic. No erythema.  Psychiatric: He has a normal mood and affect.    ED Course  Procedures (including critical care time)  Labs Reviewed  CBC WITH DIFFERENTIAL - Abnormal; Notable for the following:    WBC 3.8 (*)    RBC 3.40 (*)    Hemoglobin 9.3 (*)    HCT 29.0 (*)    RDW 17.4 (*)    Eosinophils Relative 6 (*)    All other components within normal limits  BASIC METABOLIC PANEL - Abnormal; Notable for the following:    Glucose, Bld 138 (*)    BUN 27 (*)    Creatinine, Ser 1.60 (*)    GFR calc non Af Amer 43 (*)    GFR calc Af Amer 49 (*)    All other components within normal limits  POCT I-STAT TROPONIN I   Dg Chest 2 View  06/14/2012  *RADIOLOGY REPORT*  Clinical Data: Shortness of breath.  CHEST - 2 VIEW  Comparison: 11/29/2011  Findings: Previous CABG.  Stable cardiomegaly.  Lungs clear.  No effusion.  IMPRESSION:  1.  Stable cardiomegaly and changes of CABG.   Original Report Authenticated By: D. Andria Rhein, MD     Date: 06/14/2012  Rate: 53  Rhythm: sinus rhythm  QRS Axis: normal  Intervals: normal  ST/T Wave abnormalities: nonspecific T wave  abnomalities  Conduction  Disutrbances: poss LVH  Narrative Interpretation: abnormal EKG  Old EKG Reviewed: 11/29/2011, abnormal EKG, no acute changes  1. Chest pain   2. Iron deficiency anemia   3. Hypertension   4. Chronic systolic heart failure   5. CAD (coronary artery disease)   6. Ischemic cardiomyopathy   7. Chronic kidney disease (CKD), stage III (moderate)   8. Homelessness   9. GI bleed   10. Moderate to severe pulmonary hypertension   11. Polysubstance abuse   12. Family history of early CAD   71. Renal artery stenosis   14. Peripheral vascular disease   15. Seizure disorder       MDM  CXR shows no acute changes and EKG is re-assuring. 1st set of troponin negative at 9am (two hours after onset of sx). However, given pt hx of MI/CABC and presenting sx, will consider admit to r/o ACS as pt is homeless and unreliable for follow up with PCP or cardiologist. Dr. Bernette Mayers will arrange admission.     Glade Nurse, PA-C 06/14/12 1259

## 2012-06-14 NOTE — ED Notes (Signed)
Pt returned from xray

## 2012-06-14 NOTE — ED Notes (Signed)
Pt presents to ED via EMS with c/o of dizziness for a week and chest pain that started this am. Pt has history of MI, CABG. NAD. EMS placed 20g in Left EC, 324 baby asa given, 1 SL nitro without relief.

## 2012-06-14 NOTE — ED Notes (Signed)
Patient is actively having chest pain. Internal medicine was notified and patient will be reassigned to a stepdown bed instead of tele.

## 2012-06-14 NOTE — H&P (Signed)
Date: 06/14/2012                Patient Name:  Charles [name]  MRN: [id number]   DOB: [birth year]  Age / Sex: 67 y.o., male   PCP: Bernerd Limbo              Medical Service: Internal Medicine Teaching Service              Attending Physician: Dr. Everardo Beals    First Contact: Dr. Lavena Bullion Pager: [phone]  Second Contact: Dr. Everardo Beals Pager: [phone]            After Hours (After 5pm / weekends / holidays): First Contact Second Contact Pager: Pager: [phone]         Chief Complaint: chest pain  History of Present Illness: Patient is a 67 y.o. male with a PMHx of CAD (s/p 3-vessel CABG 2009), ischemic cardiomyopathy / chronic systolic heart failure (EF 35-40%), homelessness, CKDIII, and chronic iron-deficiency anemia who presents to [facility] for evaluation of acute onset chest pain that began on the morning of admission. Patient indicates that he experienced nausea, lightheadedness, and chest pain when walking out of the dining hall this morning. Pain is described as a sharp chest pain located over left chest. The pain is rated as a 9/10 in severity at onset and currently rated 5/10. He called EMS, and did not self-administer any medications. No notable aggravating or alleviating symptoms were noted. admits to associated nausea and mild shortness of breath.  Did not have any associated palpitations, diaphoresis, or vomiting. No tearing or ripping pain. Patient was administered NTG and ASA during the ER course, and did not note improvement. However, he did have improvement of symptoms after moprhine was administered.   The patient has experienced similar symptoms previously - last episode was 2 days ago, lasting all day and subsequently stopped spontanesouly. Secondary to his constellation of symptoms, [name] presented to [facility] for further evaluation. Otherwise, the patient confirms occasionally productive cough over last 1 month and unintentional weight loss of approximately 10 lbs  over last 2 months. He also indicates recent unprotected sexual encounter 4 weeks ago, with resultant penile discharge and dysuria since that time.   Review of Systems: Constitutional:  admits to recent unintentional weight loss, decreased appetite Denies fever, chills, diaphoresis, and fatigue.  HEENT: denies eye pain, redness, hearing loss, ear pain, congestion, sore throat, rhinorrhea.  Respiratory: admits to SOB, cough. Denies chest tightness, and wheezing.  Cardiovascular: admits to chest pain. Denies palpitations and leg swelling.  Gastrointestinal: admits to nausea. Denies vomiting, abdominal pain, diarrhea, constipation, blood in stool.  Genitourinary: admits to dysuria, penile discharge light brown. Denies urgency, frequency, hematuria, flank pain and difficulty urinating.  Musculoskeletal: admits to chronic low back pain, BL LE pain and into calves with walking. Denies joint swelling, arthralgias and gait problem.   Skin: admits to rash over back x  Denies new wounds.  Neurological: admits to lightheadedness. Denies dizziness, seizures, syncope, weakness,  numbness.   Hematological: denies easy bruising, personal or family bleeding history.  Psychiatric/ Behavioral: admits to depression.  Denies suicidal ideation, mood changes, sleep disturbance and agitation.    Current Outpatient Medications:  ** Unknown home medications - patient unaware of his medications, and we are pending pharmacy medication reconciliation at this time. The list below is of past medication list.  Medication Sig  . amLODipine (NORVASC) 2.5 MG tablet Take 2.5 mg by mouth daily.  [name] atorvastatin (LIPITOR) 20  MG tablet Take 20 mg by mouth at bedtime.  . carvedilol (COREG) 12.5 MG tablet Take 12.5 mg by mouth 2 (two) times daily with a meal.  . citalopram (CELEXA) 20 MG tablet Take 30 mg by mouth at bedtime.  . cloNIDine (CATAPRES) 0.2 MG tablet Take 0.2 mg by mouth 2 (two) times daily.  . divalproex  (DEPAKOTE) 500 MG DR tablet Take 500 mg by mouth 2 (two) times daily.  . ferrous sulfate 325 (65 FE) MG tablet Take 325 mg by mouth 2 (two) times daily with a meal.  . furosemide (LASIX) 40 MG tablet Take 40 mg by mouth daily.  . hydrALAZINE (APRESOLINE) 100 MG tablet Take 100 mg by mouth every 8 (eight) hours.  [name] lisinopril (PRINIVIL,ZESTRIL) 40 MG tablet Take 40 mg by mouth daily.  . pantoprazole (PROTONIX) 40 MG tablet Take 40 mg by mouth 2 (two) times daily.  . phenytoin (DILANTIN) 100 MG ER capsule Take 200-300 mg by mouth 2 (two) times daily. Take 2 capsules by mouth in the morning and 3 capsules by mouth at bedtime.  . potassium chloride (K-DUR,KLOR-CON) 10 MEQ tablet Take 10 mEq by mouth daily.  [name] spironolactone (ALDACTONE) 50 MG tablet Take 50 mg by mouth daily.     Allergies: Allergies  Allergen Reactions  . Motrin (Ibuprofen) Hives and Nausea And Vomiting  . Tylenol (Acetaminophen) Hives and Nausea And Vomiting     Past Medical History: Past Medical History  Diagnosis Date  . Iron deficiency anemia   . Hypertension   . Chronic systolic heart failure     2D Echo (03/2010) - EF 35-40% with akinesis of inferoposterior myocardium  . CAD (coronary artery disease)     s/p 3-vessel CABG (12/2007) // 100% RCA stenosis with collaterals from left system. Severe bifurcation lesions of proxima CXA and OM. Moderate LAD diseaseFollow[facility]  . Hyperlipidemia LDL goal <70   . Obstructive sleep apnea     not on home CPAP  . Ischemic cardiomyopathy   . Chronic kidney disease (CKD), stage III (moderate)     BL SCr 1.5-1.6  . Homelessness   . 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 // Colonoscopy and EGD in 03/2010 negative per report (record not found)  . 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 Polysubstance  abuse     cocaine, THC  . AV malformation of gastrointestinal tract   . Family history of early CAD   . DDD (degenerative disc disease), lumbar   . Renal artery stenosis   . Peripheral vascular disease   . Seizure disorder   . Diabetes     Past Surgical History: Past Surgical History  Procedure Laterality Date  . Coronary artery bypass graft  12/2007  . Apc  03/2010    To treat small bowel AVMs    Family History: Family History  Problem Relation Age of Onset  . Heart disease Mother     unknown type  . Heart disease Father 61    died of MI at 38yo  . Heart disease Paternal Grandfather 89    died of MI  . Heart disease    . Heart disease Brother 26    Social History: History   Social History  . Marital Status: Single  Spouse Name: N/A    Number of Children: 2  . Years of Education: N/A   Occupational History  . Unemployed     previously worked in [employer]  .     Social History Main Topics  . Smoking status: Former Smoker -- 0.20 packs/day for 13 years    Types: Cigarettes    Quit date: 03/20/1997  . Smokeless tobacco: Never Used  . Alcohol Use: Yes     Comment: occasionally drinks, a few times a month  . Drug Use: Yes    Special: Cocaine, Marijuana     Comment: cocaine, MJ - indicates he last used 2013  . Sexually Active: No   Other Topics Concern  . Not on file   Social History Narrative   Lives in [facility]. Originally from [place], has been in the [place] since 1980s                   Vital Signs: Blood pressure 155/83, pulse 51, temperature 97.5 F (36.4 C), temperature source Oral, resp. rate 34, SpO2 99.00%.  Physical Exam: General: Vital signs reviewed and noted. Well-developed, well-nourished, in no acute distress; alert, appropriate and cooperative throughout examination.  Head: Normocephalic, atraumatic.  Eyes: PERRL, EOMI, No signs of anemia or jaundince.  Nose: Mucous membranes moist, not inflammed,  nonerythematous.  Throat: Oropharynx nonerythematous, no exudate appreciated.   Neck: No deformities, masses, or tenderness noted. Supple, no JVD.  Lungs:  Normal respiratory effort. Clear to auscultation BL without crackles or wheezes.  Heart: RRR. S1 and S2 normal without gallop, murmur, or rubs.  Abdomen:  BS normoactive. Soft, Nondistended, non-tender.  No masses or organomegaly.  Extremities: No pretibial edema.  Genital: No inguinal lymphadenopathy appreciated. No penile or scrotal lesions, no penile discharge.  Neurologic: A&O X3, CN II - XII are grossly intact. Motor strength is 5/5 in the all 4 extremities, Sensations intact to light touch, Cerebellar signs negative.  Skin: No visible rashes, scars.    Lab results: CURRENT LABS: CBC:    Component Value Date/Time   WBC 3.8* 06/14/2012 0849   HGB 9.3* 06/14/2012 0849   HCT 29.0* 06/14/2012 0849   PLT 302 06/14/2012 0849   MCV 85.3 06/14/2012 0849   NEUTROABS 2.4 06/14/2012 0849   LYMPHSABS 0.9 06/14/2012 0849   MONOABS 0.2 06/14/2012 0849   EOSABS 0.2 06/14/2012 0849   BASOSABS 0.0 06/14/2012 0849     Metabolic Panel:    Component Value Date/Time   NA 138 06/14/2012 0849   K 4.1 06/14/2012 0849   CL 105 06/14/2012 0849   CO2 23 06/14/2012 0849   BUN 27* 06/14/2012 0849   CREATININE 1.60* 06/14/2012 0849   CREATININE 1.32 05/11/2010 2300   GLUCOSE 138* 06/14/2012 0849   CALCIUM 8.6 06/14/2012 0849   AST 24 06/14/2012 1121   ALT 13 06/14/2012 1121   ALKPHOS 62 06/14/2012 1121   BILITOT 0.2* 06/14/2012 1121   PROT 7.0 06/14/2012 1121   ALBUMIN 3.1* 06/14/2012 1121     Urinalysis: Pending  Drugs of Abuse     Component Value Date/Time   LABOPIA POSITIVE* 06/14/2012 1112   COCAINSCRNUR NONE DETECTED 06/14/2012 1112   LABBENZ NONE DETECTED 06/14/2012 1112   AMPHETMU NONE DETECTED 06/14/2012 1112   THCU NONE DETECTED 06/14/2012 1112   LABBARB NONE DETECTED 06/14/2012 1112     Cardiac Labs:   Recent Labs Lab 06/14/12 1120    TROPONINI <0.30    Recent Labs Lab 06/14/12  1120  PROBNP 613.3*    HISTORICAL LABS: Lab Results  Component Value Date   HGBA1C 7.1 03/29/2010    Lab Results  Component Value Date   CHOL 108 06/14/2012   HDL 43 06/14/2012   LDLCALC 48 06/14/2012   TRIG 85 06/14/2012   CHOLHDL 2.5 06/14/2012    Lab Results  Component Value Date   TSH 1.598 03/24/2010    Imaging results:  Dg Chest 2 View (06/14/2012) - Stable cardiomegaly and changes of CABG. Original Report Authenticated By: D. Andria Rhein, MD     Other results:  EKG (06/14/2012) - Normal Sinus Rhythm with a rate of approximately 70 bpm, normal axis, Q waves noted in leads II, III, aVF. ST segments: nonspecific ST/T changes.    Last Echo (03/2010):   LV: Mildly dilated. LV EF 35% to 40%. There is akinesis of the inferoposterior myocardium. Doppler parameters are consistent with high ventricular filling pressure.   RV: Moderately dilated. Systolic function moderately to severely reduced.   Right atrium: Moderately dilated.   Tricuspid valve: Moderate regurgitation.  Pulmonary arteries: Systolic pressure was severely increased. PA peak pressure: 76mm Hg (S).   Assessment & Plan: Pt is a 67 y.o. yo male with a PMHX of CAD (s/p 3-vessel CABG 2009), ischemic cardiomyopathy / chronic systolic heart failure (EF 35-40%), homelessness, CKDIII, and chronic iron-deficiency anemia who presented to [facility] on 06/14/2012 with left chest pain concerning for unstable angina.  1) Unstable angina - Patient has known CAD, history of medication noncompliance, family history of early CAD, typical CP symptoms of exertional angina with associated nausea and shortness of breath. Given constellation of history/ symptoms, we must evaluate further for ACS. Uncontrolled GERD is also possible - however, he is on high dose protonix already (and has had negative EGD in 2012). Otherwise, CXR negative for PNA, PTX, or mediastinal widening to suggest  dissection.  Plan: - Continue to cycle cardiac enzymes. - Continue aspirin, statin therapy. - Hold BB in setting of bradydardia - Continue morphine, oxygen, PRN nitroglycerin - Check HgA1c, lipid panel, and TSH for risk factor stratification.  - Continue protonix.  2) Dysuria/ penile discharge - in setting of high risk sexual behavior, he is at high risk for GC/chlamydia, UTI.  - Check clean catch urinalysis. - Check urine GC/ chlamydia - Check HIV Ab.  3) Chronic kidney disease - BL SCr around 1.6. Admission SCr of 1.6 approximately at baseline. - Continue to monitor.  4) Iron deficiency anemia - Recent PCP Hgb of 9, admission Hgb of 9.3 approximately at baseline. Is on iron therapy TID at home, however unclear of compliance. Denies recent urinary or stool bleeding. Last EGD/colonoscopy in 2012 negative per DC summary (record could not be found). 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 - Will check FOBT. - Resume iron TID. - Check anemia panel.  5) Hypertension - stable on admission. - Will need to request pharmacy reconcilitation to assess current medications, as pt unaware. - Will restart limited regimen with: clonidine, lasix, spironolactone, hydralazine.  - Hold lisinopril (in case needs catheterization) and BB in setting of bradycardia.  6) Ischemic cardiomyopathy / Chronic systolic heart failure without acute exacerbation - No evidence of volume overload on admission. LV EF 35-45% in 2012. Is on appropriate heart failure medications. Follows with Dr. Rhona Leavens in [place]. - Continue home medications as above (in HTN section)  7) CAD (coronary artery disease) - s/p CABG 3 vessel in CABG - per cardiologist record review, seems some medication noncompliance. Has had negative stress test in last few years although records no available.  - Continue statin, aspirin, hold BB for now. - Will attempt to get  additional records from [place] [facility]. - Also will need to assess if deescalation of home medications is possible to allow for improved compliance.  8) Homelessness - currently living in [facility] shelter. States he is supposed to be moving to an apartment soon. - Will involve social work to assess if any help with process.  9) Moderate to severe pulmonary hypertension - possibly in setting of uncontrolled OSA. Given he is homeless, likely will be unable to continue with regular CPAP usage. - Check noctural O2.  10) Polysubstance abuse - remote cocaine and THC abuse. Ordered UDS, is negative. No tobacco abuse per report. - Continue to encourage cessation.  11) Seizure disorder - on dilantin and depakote per record review. Unclear of compliance.  - Check dilantin levels. - Await pharmacy rec.  12) Diabetes - A1c 7 during last admission. Not on home meds. - Check A1c. - SSI.    DVT PPX - heparin  CODE STATUS - Full - discussed with pt  CONSULTS PLACED - SW  DISPO - Disposition is deferred at this time, awaiting evaluation of CP symptoms.   Anticipated discharge in approximately 1-2 day(s).   The patient does have a current PCP (Dr. Bernerd Limbo, [phone]).  Will not need OPC follow-up appointment.  Lastly, the patient does have transportation limitations that hinder transportation to clinic appointments.  Signed: Priscella Mann, DO  PGY-3, Internal Medicine Resident 06/14/2012, 1:01 PM

## 2012-06-15 DIAGNOSIS — R079 Chest pain, unspecified: Secondary | ICD-10-CM

## 2012-06-15 LAB — CBC
Platelets: 304 10*3/uL (ref 150–400)
RBC: 3.42 MIL/uL — ABNORMAL LOW (ref 4.22–5.81)
WBC: 3.5 10*3/uL — ABNORMAL LOW (ref 4.0–10.5)

## 2012-06-15 LAB — BASIC METABOLIC PANEL
CO2: 22 mEq/L (ref 19–32)
Calcium: 8.6 mg/dL (ref 8.4–10.5)
GFR calc non Af Amer: 39 mL/min — ABNORMAL LOW (ref >90)
Sodium: 136 mEq/L (ref 135–145)

## 2012-06-15 LAB — TROPONIN I: Troponin I: <0.3 ng/mL (ref <0.30)

## 2012-06-15 MED ORDER — GI COCKTAIL ~~LOC~~
30.0000 mL | Freq: Once | ORAL | Status: AC
  Administered 2012-06-15: 30 mL via ORAL
  Filled 2012-06-15: qty 30

## 2012-06-15 MED ORDER — AZITHROMYCIN 500 MG PO TABS
1000.0000 mg | ORAL_TABLET | Freq: Once | ORAL | Status: AC
  Administered 2012-06-15: 1000 mg via ORAL
  Filled 2012-06-15: qty 2

## 2012-06-15 MED ORDER — SODIUM CHLORIDE 0.9 % IV BOLUS (SEPSIS)
250.0000 mL | Freq: Once | INTRAVENOUS | Status: AC
  Administered 2012-06-15: 250 mL via INTRAVENOUS

## 2012-06-15 MED ORDER — CEFTRIAXONE SODIUM 250 MG IJ SOLR
250.0000 mg | Freq: Once | INTRAMUSCULAR | Status: AC
  Administered 2012-06-15: 250 mg via INTRAMUSCULAR
  Filled 2012-06-15: qty 250

## 2012-06-15 NOTE — Progress Notes (Signed)
UR completed 

## 2012-06-15 NOTE — Progress Notes (Signed)
Subjective:    Patient states he has 8/10 chest pain this AM, but appears very comfortable in bed, and expresses irritation that he has not seen CSW yet--he states he wants to speak with CSW prior to being discharged. To note, although patient complains of chest pain, he has not requested any medication for alleviation, and again seems much more focused on someone finding him a place to stay.   Interval Events: No acute events.    Objective:    Vital Signs:   Temp:  [97.3 F (36.3 C)-98.1 F (36.7 C)] 97.7 F (36.5 C) (03/29 0734) Pulse Rate:  [51-70] 56 (03/29 0733) Resp:  [15-34] 18 (03/29 0733) BP: (115-166)/(51-109) 126/71 mmHg (03/29 0733) SpO2:  [93 %-99 %] 94 % (03/29 0734) Weight:  [180 lb (81.647 kg)] 180 lb (81.647 kg) (03/28 1753) Last BM Date: 06/14/12  24-hour weight change: Weight change:   Intake/Output:   Intake/Output Summary (Last 24 hours) at 06/15/12 0916 Last data filed at 06/15/12 0800  Gross per 24 hour  Intake    320 ml  Output    500 ml  Net   -180 ml      Physical Exam: General: Vital signs reviewed and noted. Well-developed, well-nourished, in no acute distress; alert, appropriate and cooperative throughout examination.  Lungs:  Normal respiratory effort. Clear to auscultation BL without crackles or wheezes.  Heart: RRR. S1 and S2 normal without gallop, murmur, or rubs.  Abdomen:  BS normoactive. Soft, Nondistended, non-tender.  No masses or organomegaly.  Extremities: No pretibial edema.     Labs:  Basic Metabolic Panel:  Recent Labs Lab 06/14/12 0849 06/14/12 1805 06/15/12 0500  NA 138  --  136  K 4.1  --  4.2  CL 105  --  101  CO2 23  --  22  GLUCOSE 138*  --  92  BUN 27*  --  29*  CREATININE 1.60* 1.55* 1.73*  CALCIUM 8.6  --  8.6    Liver Function Tests:  Recent Labs Lab 06/14/12 1121  AST 24  ALT 13  ALKPHOS 62  BILITOT 0.2*  PROT 7.0  ALBUMIN 3.1*   CBC:  Recent Labs Lab 06/14/12 0849 06/14/12 1805  06/15/12 0500  WBC 3.8* 3.5* 3.5*  NEUTROABS 2.4  --   --   HGB 9.3* 9.5* 9.2*  HCT 29.0* 29.5* 28.7*  MCV 85.3 84.0 83.9  PLT 302 311 304    Cardiac Enzymes:  Recent Labs Lab 06/14/12 1120 06/14/12 1654 06/14/12 2315 06/15/12 0509  TROPONINI <0.30 <0.30 <0.30 <0.30    CBG:  Recent Labs Lab 06/14/12 1719  GLUCAP 97    Microbiology: Results for orders placed during the hospital encounter of 06/14/12  MRSA PCR SCREENING     Status: Abnormal   Collection Time    06/14/12  5:24 PM      Result Value Range Status   MRSA by PCR POSITIVE (*) NEGATIVE Final   Comment:            The GeneXpert MRSA Assay (FDA     approved for NASAL specimens     only), is one component of a     comprehensive MRSA colonization     surveillance program. It is not     intended to diagnose MRSA     infection nor to guide or     monitor treatment for     MRSA infections.     RESULT CALLED TO, READ BACK BY  AND VERIFIED WITH:     RITA BROWN 3.28.14 AT 1939 [id number]   Imaging: Dg Chest 2 View  06/14/2012  *RADIOLOGY REPORT*  Clinical Data: Shortness of breath.  CHEST - 2 VIEW  Comparison: 11/29/2011  Findings: Previous CABG.  Stable cardiomegaly.  Lungs clear.  No effusion.  IMPRESSION:  1.  Stable cardiomegaly and changes of CABG.   Original Report Authenticated By: D. Andria Rhein, MD        Medications:    Infusions:    Scheduled Medications: . aspirin EC  81 mg Oral Daily  . atorvastatin  20 mg Oral QHS  . azithromycin  1,000 mg Oral Once  . cefTRIAXone (ROCEPHIN) IM  250 mg Intramuscular Once  . Chlorhexidine Gluconate Cloth  6 each Topical Q0600  . citalopram  30 mg Oral QHS  . cloNIDine  0.2 mg Oral BID  . divalproex  500 mg Oral BID  . ferrous sulfate  325 mg Oral BID WC  . furosemide  40 mg Oral Daily  . gi cocktail  30 mL Oral Once  . heparin  5,000 Units Subcutaneous Q8H  . hydrALAZINE  100 mg Oral Q8H  . mupirocin ointment  1 application Nasal BID  . pantoprazole   40 mg Oral BID  . sodium chloride  3 mL Intravenous Q12H    PRN Medications: sodium chloride, morphine injection, nitroGLYCERIN, nitroGLYCERIN, ondansetron (ZOFRAN) IV, sodium chloride   Assessment/ Plan:   Pt is a 67 y.o. yo male with a PMHX of CAD (s/p 3-vessel CABG 2009), ischemic cardiomyopathy / chronic systolic heart failure (EF 35-40%), homelessness, CKDIII, and chronic iron-deficiency anemia who [facility] on [facility]/14/2012 with left chest pain.   Chest pain - troponin neg x 3 and EKG unchanged from baseline. Although UA is a possible etiology, it seems unlikely at this time given the patient's atypical complaints, and the fact that his complaints of chest pain seem to be associated with avoidance of being discharged without seeing SW to discuss living options, as he is unhappy at the homeless shelter he has been at for the last 2 weeks, and states that he does not want to work with the social workers at [facility] as they require him to take certain steps to help himself prior to them helping him find an apartment. To note, patient's last presentation to the [facility] ED with chest pain appeared to be complicated by issues of secondary gain as well, as the patient had just been put in jail that day.  - Continue aspirin, statin therapy.  - Hold BB in setting of bradydardia  - Continue morphine, oxygen, PRN nitroglycerin  - Continue protonix.  - continue GI cocktails PRN  Dysuria/ penile discharge - in setting of high risk sexual behavior, he is at high risk for GC/chlamydia, UTI.  - treat empirically for GC/chlamydia with ceftriaxone/azithro  Chronic kidney disease - Slightly elevated since admission today (1.6 => 1.73), but overall stable. - holding ACEi  Iron deficiency anemia - Recent PCP Hgb of 9, admission Hgb of 9.3 approximately at baseline. Is on iron therapy TID at home, however unclear of compliance. Denies recent urinary or stool bleeding. Last EGD/colonoscopy in 2012 negative  per DC summary (record could not be found). 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 Anemia panel performed on admission was unrevealing.   - await FOBT - cont iron  TID  Hypertension - BP well-controlled overnight on home regimen (confirmed by patient).  - cont home meds  Ischemic cardiomyopathy / Chronic systolic heart failure without acute exacerbation - No evidence of volume overload on admission. LV EF 35-45% in 2012. Is on appropriate heart failure medications. Follows with Dr. Rhona Leavens in [place].  - Continue home medications as above (in HTN section)  - holding ACEi  CAD (coronary artery disease) - s/p CABG 3 vessel in CABG - per cardiologist record review, seems some medication noncompliance. Has had negative stress test in last few years although records no available.  - Continue statin, aspirin, hold BB for now.  - Will attempt to get additional records from [place] [facility].  - Also will need to assess if deescalation of home medications is possible to allow for improved compliance.   Homelessness - currently living in [facility]. States he is supposed to be moving to an apartment soon.  - Will involve social work to assess if any help with process.   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 Given he is homeless, likely will be unable to continue with regular CPAP usage. No desaturations overnight.   Polysubstance abuse - remote cocaine and THC abuse. Ordered UDS, is negative. No tobacco abuse per report.  - Continue to encourage cessation.   Seizure disorder - on dilantin and depakote per record review. Unclear of compliance. Also unclear if he has epilepsy or if his seizures were secondary to alcohol/drug abuse.  - f/u dilantin levels.   Diabetes - A1c=5.5. Good glycemic control overnight.  - cont SSI   DVT PPX - heparin  CODE  STATUS - full  CONSULTS PLACED - N/A  DISPO - Disposition is deferred at this time, awaiting improvement of current medical problems.   Anticipated discharge in approximately 1-2 day(s).   The patient does have a current PCP (MITCHELL, JOHN) and does not need an [facility] hospital follow-up appointment after discharge.    Is the [facility] hospital follow-up appointment a one-time only appointment? not applicable.  Does the patient have transportation limitations that hinder transportation to clinic appointments? unknown   SERVICE NEEDED AT DISCHARGE - TO BE DETERMINED DURING HOSPITAL COURSE         Y = Yes, Blank = No PT:   OT:   RN:   Equipment:   Other:      Length of Stay: 1 day(s)   Signed: Elfredia Nevins, MD  PGY-1, Internal Medicine Resident Pager: [phone] (7AM-5PM) 06/15/2012, 9:16 AM

## 2012-06-15 NOTE — Progress Notes (Signed)
Social worker called and stated that she left list of facilities at patients bedside with a bus pass.

## 2012-06-15 NOTE — Progress Notes (Signed)
Report called to [facility] on 2000. Also let her know that patient will need a new consult placed for Social Worker if patient is still here on Monday to assist with his placement in assisted living per [place] with case management. Patient finishing his meal and nurse will call back when she is ready for [place] to transport patient to new room 2037. Will continue to assess and monitor.

## 2012-06-15 NOTE — H&P (Signed)
Internal Medicine Teaching Service Attending Note Date: 06/15/2012  Patient name: Charles [name]  Medical record number: [id number]  Date of birth: [birth year]   I have read the documentation on this case by Dr. Thad Ranger and agree with it with the following additions/observations:   Charles [name] is a 67 year old african american gentleman who comes in with chest pain. He has a past medical history significant for CAD (s/p 3-vessel CABG 2009), ischemic cardiomyopathy / chronic systolic heart failure (EF 35-40%), CKDIII, and chronic iron-deficiency anemia. He is homeless. His pain was 9/10 severity, started out of the blue yesterday when he was resting, and was not relieved with aspirin or nitroglycerin. The patient felt nausea and dizziness with the pain, and says that this pain is different from acid reflux (and then stops a little, and adds, "I have no acid reflux" though he is on pantoprazole) The pain was present all day according to the patient, and on coming to the ED, he felt better with morphine and GI cocktail. He says he often experiences the pain after he has had food, sometimes without. This morning, he says he is till having some pain, of stabbing nature which comes and goes. The pain is not associated with breathing. He does not have cough, or fever, chills.      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  Review of systems as per resident note.    Vitals reviewed. Filed  Vitals:   06/15/12 0400 06/15/12 0551 06/15/12 0733 06/15/12 0734  BP: 115/70 147/67 126/71   Pulse: 55  56   Temp:    97.7 F (36.5 C)  TempSrc:    Oral  Resp: 15 19 18    Height:      Weight:      SpO2: 95%  94% 94%    General: Resting in bed. HEENT: PERRL, EOMI, no scleral icterus. Heart: RRR, no rubs, murmurs or gallops. Chest: Some tenderness (chronic) Lungs: Clear to auscultation bilaterally, no wheezes, rales, or rhonchi. Abdomen: Soft, nontender, nondistended, BS present. Extremities: Warm, no pedal edema. Neuro: Alert and oriented X3, cranial nerves II-XII grossly intact,  strength and sensation to light touch equal in bilateral upper and lower extremities  Labs, EKG and imaging reviewed.    Assessment and Plan    1. Chest pain - Unstable angina versus GERD. The patient has been ruled out.    Recent Labs Lab 06/14/12 1120 06/14/12 1654 06/14/12 2315 06/15/12 0509  TROPONINI <0.30 <0.30 <0.30 <0.30   His EKG does not show any changes pertinent for Acute MI. He was in sinus bradycardia to 52 without symptoms, with beta blocker on board. He has had some T wave non-specific changes in the lateral leads, which have been present in his old EKGs from September 2013. He is not hypoxic or short of breath. His BNP is lower than what it has been previously and he does not seem congested or overloaded at this point.   Results for Charles [name], Charles [name] (MRN [id number]) as of 06/15/2012 10:15  Ref. Range 05/09/2010 15:20 05/10/2010 03:33 05/12/2010 01:34 05/13/2010 04:30 06/14/2012 11:20  Pro B Natriuretic  peptide (BNP) Latest Range: 0-125 pg/mL 2147.0 (H) 2378.0 (H) 948.0 (H) 745.0 (H) 613.3 (H)   He did get relief from GI cocktail and has not asked for any morphine overnight. At this point.   Renal function seems to be worsening. A slight normal saline bolus with caution will be done.     Recent Labs Lab 06/14/12 0849 06/14/12 1805 06/15/12 0500  NA 138  --  136  K 4.1  --  4.2   CL 105  --  101  CO2 23  --  22  GLUCOSE 138*  --  92  BUN 27*  --  29*  CREATININE 1.60* 1.55* 1.73*  CALCIUM 8.6  --  8.6   For dysuria, penile discharge, ok to test for GC and HIV. Ok to treat empirically given symptoms and history of unprotected sexual intercourse 4 weeks ago.   Hypertension has been well controlled. Diabetes probably does not medications because of kidney failure. A1c was 7 before and is 5.5 now with no medications.  Lab Results  Component Value Date   HGBA1C 5.5 06/14/2012    Overall, I would think its ok to restart this beta blocker, and if his pain is controlled, and creatinine shows a downward trend, discharge with an early cardiology follow up this week. If he does not have nitro on board at home, he may be prescribed some if no contraindications (Does he take Viagra? Cialis?).     Charles [name] 06/15/2012, 10:04 AM.

## 2012-06-16 LAB — BASIC METABOLIC PANEL
CO2: 28 mEq/L (ref 19–32)
Calcium: 8.6 mg/dL (ref 8.4–10.5)
Glucose, Bld: 82 mg/dL (ref 70–99)
Potassium: 4.6 mEq/L (ref 3.5–5.1)
Sodium: 136 mEq/L (ref 135–145)

## 2012-06-16 NOTE — Discharge Summary (Signed)
Internal Medicine Teaching Service Attending Note Date: 06/16/2012  Patient name: Charles [name]  Medical record number: [id number]  Date of birth: 10-Jul-1945    I evaluated the patient on the day of discharge and discussed the discharge plan with my resident team. I agree with the discharge documentation and disposition.  Thank you for working with me on this patient's care.   Thanks Aletta Edouard 06/16/2012, 2:10 PM

## 2012-06-16 NOTE — Progress Notes (Signed)
Pt discharged per MD order and protocol. Discharge instructions reviewed with patient and all questions answered. Pt aware of all follow up appointments.  

## 2012-06-16 NOTE — Progress Notes (Signed)
Subjective: No acute events overnight.  Has not needed any pain medication.  No significant events on tele. He does complain of feeling dizzy and nauseous, but has eaten his entire breakfast.  Per RN, he has had no complaints since coming to the floor yesterday.  Objective: Vital signs in last 24 hours: Filed Vitals:   06/15/12 1127 06/15/12 1526 06/15/12 2040 06/16/12 0603  BP: 91/35 102/50 126/53 148/63  Pulse: 54 54 57 57  Temp: 98.7 F (37.1 C) 98.2 F (36.8 C) 97.9 F (36.6 C) 97.5 F (36.4 C)  TempSrc: Oral Oral Oral Oral  Resp: 16 19 20 18   Height:      Weight:      SpO2: 95% 95% 100% 100%   Weight change:   Intake/Output Summary (Last 24 hours) at 06/16/12 0748 Last data filed at 06/15/12 1300  Gross per 24 hour  Intake    973 ml  Output      0 ml  Net    973 ml   General: resting in bed, no acute distress HEENT: PERRL, EOMI, no scleral icterus Cardiac: RRR, no rubs, murmurs or gallops Pulm: clear to auscultation bilaterally, moving normal volumes of air Abd: soft, nontender, nondistended, BS present Ext: warm and well perfused, trace pedal edema Neuro: alert and oriented X3, cranial nerves II-XII grossly intact  Lab Results: Basic Metabolic Panel:  Recent Labs Lab 06/14/12 0849 06/14/12 1805 06/15/12 0500  NA 138  --  136  K 4.1  --  4.2  CL 105  --  101  CO2 23  --  22  GLUCOSE 138*  --  92  BUN 27*  --  29*  CREATININE 1.60* 1.55* 1.73*  CALCIUM 8.6  --  8.6   Liver Function Tests:  Recent Labs Lab 06/14/12 1121  AST 24  ALT 13  ALKPHOS 62  BILITOT 0.2*  PROT 7.0  ALBUMIN 3.1*   CBC:  Recent Labs Lab 06/14/12 0849 06/14/12 1805 06/15/12 0500  WBC 3.8* 3.5* 3.5*  NEUTROABS 2.4  --   --   HGB 9.3* 9.5* 9.2*  HCT 29.0* 29.5* 28.7*  MCV 85.3 84.0 83.9  PLT 302 311 304   Cardiac Enzymes:  Recent Labs Lab 06/15/12 1050 06/15/12 1720 06/15/12 2254  TROPONINI <0.30 <0.30 <0.30   BNP:  Recent Labs Lab 06/14/12 1120   PROBNP 613.3*   CBG:  Recent Labs Lab 06/14/12 1719  GLUCAP 97   Hemoglobin A1C:  Recent Labs Lab 06/14/12 1121  HGBA1C 5.5   Fasting Lipid Panel:  Recent Labs Lab 06/14/12 1120  CHOL 108  HDL 43  LDLCALC 48  TRIG 85  CHOLHDL 2.5   Thyroid Function Tests:  Recent Labs Lab 06/14/12 1121  TSH 2.413   Anemia Panel:  Recent Labs Lab 06/14/12 1654  VITAMINB12 421  FOLATE 14.7  FERRITIN 50  TIBC 425  IRON 70  RETICCTPCT 1.2   Urine Drug Screen: Drugs of Abuse     Component Value Date/Time   LABOPIA POSITIVE* 06/14/2012 1112   COCAINSCRNUR NONE DETECTED 06/14/2012 1112   LABBENZ NONE DETECTED 06/14/2012 1112   AMPHETMU NONE DETECTED 06/14/2012 1112   THCU NONE DETECTED 06/14/2012 1112   LABBARB NONE DETECTED 06/14/2012 1112    Urinalysis:  Recent Labs Lab 06/14/12 2200  COLORURINE YELLOW  LABSPEC 1.018  PHURINE 5.0  GLUCOSEU NEGATIVE  HGBUR NEGATIVE  BILIRUBINUR NEGATIVE  KETONESUR NEGATIVE  PROTEINUR NEGATIVE  UROBILINOGEN 0.2  NITRITE NEGATIVE  LEUKOCYTESUR SMALL*  Micro Results: Recent Results (from the past 240 hour(s))  MRSA PCR SCREENING     Status: Abnormal   Collection Time    06-26-12  5:24 PM      Result Value Range Status   MRSA by PCR POSITIVE (*) NEGATIVE Final   Comment:            The GeneXpert MRSA Assay (FDA     approved for NASAL specimens     only), is one component of a     comprehensive MRSA colonization     surveillance program. It is not     intended to diagnose MRSA     infection nor to guide or     monitor treatment for     MRSA infections.     RESULT CALLED TO, READ BACK BY AND VERIFIED WITH:     RITA BROWN 2012-06-26 AT 1939 [email or web address]   Studies/Results: Dg Chest 2 View June 26, 2012   IMPRESSION:  1.  Stable cardiomegaly and changes of CABG.   Original Report Authenticated By: D. Andria Rhein, MD    Medications: I have reviewed the patient's current medications. Scheduled Meds: . aspirin EC  81 mg Oral Daily   . atorvastatin  20 mg Oral QHS  . Chlorhexidine Gluconate Cloth  6 each Topical Q0600  . citalopram  30 mg Oral QHS  . cloNIDine  0.2 mg Oral BID  . divalproex  500 mg Oral BID  . ferrous sulfate  325 mg Oral BID WC  . heparin  5,000 Units Subcutaneous Q8H  . hydrALAZINE  100 mg Oral Q8H  . mupirocin ointment  1 application Nasal BID  . pantoprazole  40 mg Oral BID  . sodium chloride  3 mL Intravenous Q12H   Continuous Infusions:  PRN Meds:.sodium chloride, morphine injection, nitroGLYCERIN, nitroGLYCERIN, ondansetron (ZOFRAN) IV, sodium chloride  Assessment/Plan: Pt is a 67 y.o. yo male with a PMHX of CAD (s/p 3-vessel CABG 2009), ischemic cardiomyopathy / chronic systolic heart failure (EF 35-40%), homelessness, CKDIII, and chronic iron-deficiency anemia who presented to [facility] on 2012-06-26 with left chest pain.   Chest pain - troponin neg x 7 and EKG unchanged from baseline.  Has not required pain medication since admission to floor.  He has complaints of dizziness/nausea, but has completed his entire breakfast plate. This hospitalization is suspicious for secondary gain, as he is not happy with his current housing situation, and of note, patient's last presentation to the [facility] ED with chest pain appeared to be complicated by issues of secondary gain as well, as the patient had just been put in jail that day.  -Orthostatic vital signs - Continue aspirin, statin therapy.  - restart low dose BB - Continue morphine, oxygen, PRN nitroglycerin  - Continue protonix.  - continue GI cocktails PRN   Dysuria/ penile discharge - in setting of high risk sexual behavior, he is at high risk for GC/chlamydia, UTI.  - has been treated empirically for GC/chlamydia with ceftriaxone/azithro on 3/29  Chronic kidney disease - Slightly elevated since admission today (1.6 => 1.73), but overall stable.  - holding ACEi, follow AM labs  Iron deficiency anemia - Recent PCP Hgb of 9, admission Hgb of 9.3  approximately at baseline. Is on iron therapy TID at home, however unclear of compliance. Denies recent urinary or stool bleeding. Last EGD/colonoscopy in 2012 negative per DC summary (record could not be found). 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 Anemia panel performed on admission was unrevealing.  - await FOBT  - cont iron TID   Hypertension - BP well-controlled overnight on home regimen (confirmed by patient). Mildly elevated this AM - cont home meds   Ischemic cardiomyopathy / Chronic systolic heart failure without acute exacerbation - No evidence of volume overload on admission. LV EF 35-45% in 2012. Is on appropriate heart failure medications. Follows with Dr. Rhona Leavens in [place].  - Continue home medications as above (in HTN section)  - holding ACEi   CAD (coronary artery disease) - s/p CABG 3 vessel in CABG - per cardiologist record review, seems some medication noncompliance. Has had negative stress test in last few years although records no available.  - Continue statin, aspirin, hold BB for now.  - Also will need to assess if deescalation of home medications is possible to allow for improved compliance.   Homelessness - currently living in [facility] shelter. States he is supposed to be moving to an apartment soon.  - SW has left a list of facilities and a bus pass for patient  Moderate to severe pulmonary hypertension - possibly in setting of uncontrolled OSA. Given he is homeless, likely will be unable to continue with regular CPAP usage. No desaturations overnight.   Polysubstance abuse - remote cocaine and THC abuse. Ordered UDS, is negative. No tobacco abuse per report.  - Continue to encourage cessation.   Seizure disorder - on dilantin and depakote per record review. Unclear of compliance. Also unclear if he has epilepsy or if his seizures were secondary to alcohol/drug abuse.  -  f/u phenytoin levels.   Diabetes - A1c=5.5. Good glycemic control overnight.  - cont SSI   VTE: heparing TID  Dispo: Anticipated discharge today pending labs  The patient does have a current PCP (MITCHELL, JOHN), therefore will not be requiring OPC follow-up after discharge.   The patient does not have transportation limitations that hinder transportation to clinic appointments.  .Services Needed at time of discharge: Y = Yes, Blank = No PT:   OT:   RN:   Equipment:   Other:     LOS: 2 days   Lailana Shira 06/16/2012, 7:48 AM

## 2012-06-16 NOTE — Discharge Summary (Signed)
Internal Medicine Teaching [facility] Discharge Note  Name: Charles [name] MRN: [id number] DOB: 11-12-1945 67 y.o.  Date of Admission: 06/14/2012  8:02 AM Date of Discharge: 06/16/2012 Attending Physician: Aletta Edouard, MD  Discharge Diagnosis: Principal Problem:   Chest pain: likely GI related Active Problems:   Iron deficiency anemia   Hypertension   Chronic systolic heart failure   CAD (coronary artery disease)   Chronic kidney disease (CKD), stage III (moderate)   Homelessness   Moderate to severe pulmonary hypertension   Polysubstance abuse   Family history of early CAD   Peripheral vascular disease   Seizure disorder   High risk sexual behavior   Diabetes   GERD   Discharge Medications:   Medication List    TAKE these medications       amLODipine 2.5 MG tablet  Commonly known as:  NORVASC  Take 2.5 mg by mouth daily.     aspirin 81 MG chewable tablet  Chew 81 mg by mouth daily.     carvedilol 12.5 MG tablet  Commonly known as:  COREG  Take 12.5 mg by mouth 2 (two) times daily with a meal.     divalproex 500 MG 24 hr tablet  Commonly known as:  DEPAKOTE ER  Take 500 mg by mouth 2 (two) times daily.     ferrous sulfate 325 (65 FE) MG tablet  Take 325 mg by mouth 2 (two) times daily with a meal.     furosemide 40 MG tablet  Commonly known as:  LASIX  Take 40 mg by mouth daily.     hydrALAZINE 100 MG tablet  Commonly known as:  APRESOLINE  Take 100 mg by mouth every 8 (eight) hours.     lisinopril 20 MG tablet  Commonly known as:  PRINIVIL,ZESTRIL  Take 20 mg by mouth 2 (two) times daily.     pantoprazole 40 MG tablet  Commonly known as:  PROTONIX  Take 40 mg by mouth 2 (two) times daily.     phenytoin 100 MG ER capsule  Commonly known as:  DILANTIN  Take 200-300 mg by mouth 2 (two) times daily. Take 2 capsules by mouth in the morning and 3 capsules by mouth at bedtime.     potassium chloride 10 MEQ tablet  Commonly known as:   K-DUR,KLOR-CON  Take 10 mEq by mouth daily.     simvastatin 40 MG tablet  Commonly known as:  ZOCOR  Take 40 mg by mouth every evening.     traMADol 50 MG tablet  Commonly known as:  ULTRAM  Take 50 mg by mouth daily.        Disposition and follow-up:   Mr.Egbert [name] was discharged from [facility] in Good condition.  At the hospital follow up visit please address. Patient should schedule follow up with his PCP Dr. Bernerd Limbo and his cardiologist, Dr. Rhona Leavens within 1 week. 1. Monitor renal function and adjust lasix dose accordingly  Follow-up Appointments:  Discharge Orders   Future Orders Complete By Expires     Call MD for:  difficulty breathing, headache or visual disturbances  As directed     Call MD for:  extreme fatigue  As directed     Call MD for:  persistant dizziness or light-headedness  As directed     Call MD for:  persistant nausea and vomiting  As directed     Call MD for:  severe uncontrolled pain  As directed  Call MD for:  temperature >100.4  As directed     Diet - low sodium heart healthy  As directed     Discharge instructions  As directed     Comments:      During this hospitalizations, we determined that your chest pain was not due to a heart attack.  Your chest pain may be related to reflux disease, so be sure to eat small meals and stay away from acidic foods.  Do not lay down to sleep until 2 hours after you have eaten.    Increase activity slowly  As directed        Consultations:  none  Procedures Performed:  Dg Chest 2 View 06/14/2012  IMPRESSION:  1.  Stable cardiomegaly and changes of CABG.   Original Report Authenticated By: D. Andria Rhein, MD     Admission HPI:  Patient is a 67 y.o. male with a PMHx of CAD (s/p 3-vessel CABG 2009), ischemic cardiomyopathy / chronic systolic heart failure (EF 35-40%), homelessness, CKDIII, and chronic iron-deficiency anemia who presents to [facility] for evaluation of acute onset chest pain  that began on the morning of admission. Patient indicates that he experienced nausea, lightheadedness, and chest pain when walking out of the dining hall this morning. Pain is described as a sharp chest pain located over left chest. The pain is rated as a 9/10 in severity at onset and currently rated 5/10. He called EMS, and did not self-administer any medications. No notable aggravating or alleviating symptoms were noted. admits to associated nausea and mild shortness of breath. Did not have any associated palpitations, diaphoresis, or vomiting. No tearing or ripping pain. Patient was administered NTG and ASA during the ER course, and did not note improvement. However, he did have improvement of symptoms after moprhine was administered.  The patient has experienced similar symptoms previously - last episode was 2 days ago, lasting all day and subsequently stopped spontanesouly. Secondary to his constellation of symptoms, [name] presented to [facility] for further evaluation. Otherwise, the patient confirms occasionally productive cough over last 1 month and unintentional weight loss of approximately 10 lbs over last 2 months. He also indicates recent unprotected sexual encounter 4 weeks ago, with resultant penile discharge and dysuria since that time.  Vital Signs:  Blood pressure 155/83, pulse 51, temperature 97.5 F (36.4 C), temperature source Oral, resp. rate 34, SpO2 99.00%.   Physical Exam:  General:  Vital signs reviewed and noted. Well-developed, well-nourished, in no acute distress; alert, appropriate and cooperative throughout examination.   Head:  Normocephalic, atraumatic.   Eyes:  PERRL, EOMI, No signs of anemia or jaundince.   Nose:  Mucous membranes moist, not inflammed, nonerythematous.   Throat:  Oropharynx nonerythematous, no exudate appreciated.   Neck:  No deformities, masses, or tenderness noted. Supple, no JVD.   Lungs:  Normal respiratory effort. Clear to auscultation BL  without crackles or wheezes.   Heart:  RRR. S1 and S2 normal without gallop, murmur, or rubs.   Abdomen:  BS normoactive. Soft, Nondistended, non-tender. No masses or organomegaly.   Extremities:  No pretibial edema.   Genital:  No inguinal lymphadenopathy appreciated. No penile or scrotal lesions, no penile discharge.   Neurologic:  A&O X3, CN II - XII are grossly intact. Motor strength is 5/5 in the all 4 extremities, Sensations intact to light touch, Cerebellar signs negative.   Skin:  No visible rashes, scars.    Lab results:  CURRENT LABS:  CBC:  Component  Value  Date/Time    WBC  3.8*  06/14/2012 0849    HGB  9.3*  06/14/2012 0849    HCT  29.0*  06/14/2012 0849    PLT  302  06/14/2012 0849    MCV  85.3  06/14/2012 0849    NEUTROABS  2.4  06/14/2012 0849    LYMPHSABS  0.9  06/14/2012 0849    MONOABS  0.2  06/14/2012 0849    EOSABS  0.2  06/14/2012 0849    BASOSABS  0.0  06/14/2012 0849    Metabolic Panel:    Component  Value  Date/Time    NA  138  06/14/2012 0849    K  4.1  06/14/2012 0849    CL  105  06/14/2012 0849    CO2  23  06/14/2012 0849    BUN  27*  06/14/2012 0849    CREATININE  1.60*  06/14/2012 0849    CREATININE  1.32  05/11/2010 2300    GLUCOSE  138*  06/14/2012 0849    CALCIUM  8.6  06/14/2012 0849    AST  24  06/14/2012 1121    ALT  13  06/14/2012 1121    ALKPHOS  62  06/14/2012 1121    BILITOT  0.2*  06/14/2012 1121    PROT  7.0  06/14/2012 1121    ALBUMIN  3.1*  06/14/2012 1121    Urinalysis:  Pending  Drugs of Abuse    Component  Value  Date/Time    LABOPIA  POSITIVE*  06/14/2012 1112    COCAINSCRNUR  NONE DETECTED  06/14/2012 1112    LABBENZ  NONE DETECTED  06/14/2012 1112    AMPHETMU  NONE DETECTED  06/14/2012 1112    THCU  NONE DETECTED  06/14/2012 1112    LABBARB  NONE DETECTED  06/14/2012 1112    Cardiac Labs:   Recent Labs  Lab  06/14/12 1120   TROPONINI  <0.30     Recent Labs  Lab  06/14/12 1120   PROBNP  613.3*    HISTORICAL LABS:  Lab Results    Component  Value  Date    HGBA1C  7.1  03/29/2010    Lab Results   Component  Value  Date    CHOL  108  06/14/2012    HDL  43  06/14/2012    LDLCALC  48  06/14/2012    TRIG  85  06/14/2012    CHOLHDL  2.5  06/14/2012     Hospital Course by problem list: Pt is a 67 y.o. yo male with a PMHX of CAD (s/p 3-vessel CABG 2009), ischemic cardiomyopathy / chronic systolic heart failure (EF 35-40%), homelessness, CKDIII, and chronic iron-deficiency anemia who presented to [facility] on 06/14/2012 with left chest pain.   Chest pain - troponin neg x 7 and EKG unchanged from baseline. Has did not require pain medication since admission to floor, and did get relief with a GI cocktail x 2, this his CP is thought to be GI in origin. On the morning of discharge, he had complaints of dizziness/nausea, but completed his entire breakfast plate and orthostatic vital signs were negative at discharge (129/66, HR 65 supine, and 129/65, HR 66 standing).  This hospitalization was suspicious for secondary gain, as he has not been happy with his current housing situation, and of note, patient's last presentation to the [facility] ED with chest pain appeared to be complicated by issues of secondary gain as  well, as the patient had just been put in jail that day. During hospitalization, we continued aspirin, statin therapy, and restarted his low dose BB at discharge (coreg 12.5 BID).  He was also continued on Protonix BID.  Dysuria/ penile discharge - In setting of high risk sexual behavior, he is at high risk for GC/chlamydia, UTI. He was treated empirically for GC/chlamydia with ceftriaxone/azithro on 3/29.   Chronic kidney disease - On 11/29/11, patient's Cr = 1.35.  During hospitalization, Cr went from 1.6 --> 1.73, after which we gave a small 250cc bolus and held lasix.  On day of discharge, Cr = 1.62, and I suspect this is patient's new baseline.  He did have some trace b/l LE pedal edema, so he was restarted on lasix 40mg  daily at  discharge. He was also restarted on his ACEI.  He should have renal function monitored outpatient.   Iron deficiency anemia - Recent PCP Hgb of 9, admission Hgb of 9.3 approximately at baseline. Is on iron therapy TID at home, however unclear of compliance. Denies recent urinary or stool bleeding. Last EGD/colonoscopy in 2012 negative per DC summary (record could not be found). 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 Anemia panel performed on admission was unrevealing. TID iron was continued during hospitalization.  Hypertension - BP well-controlled overnight on home regimen (confirmed by patient), except lisinopril during hospitalization.   Ischemic cardiomyopathy / Chronic systolic heart failure without acute exacerbation - No evidence of volume overload on admission. LV EF 35-45% in 2012. Is on appropriate heart failure medications. Follows with Dr. Rhona Leavens in [place].   CAD (coronary artery disease) - s/p CABG 3 vessel in CABG - per cardiologist record review, seems some medication noncompliance. Has had negative stress test in last few years although records no available. We continued statin and aspirin and restarted BB at discharge (held initially during hospitalization due to sinus bradycardia)  Homelessness - currently living in [facility] shelter. States he is supposed to be moving to an apartment soon.  Social work did offer patient a Horticulturist, commercial of housing facilities and a bus pas.  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 Given he is homeless, likely will be unable to continue with regular CPAP usage. No desaturations overnight.   Polysubstance abuse - remote cocaine and THC abuse. Ordered UDS, is negative. No tobacco abuse per report. We continued to encourage cessation.   Seizure disorder - on dilantin and depakote per record review. Unclear of compliance. Also  unclear if he has epilepsy or if his seizures were secondary to alcohol/drug abuse.  Phenytoin levels pending at discharge.  Diabetes - A1c=5.5. Good glycemic control during hospitalization.  VTE: heparing TID during hospitalization   Discharge Vitals:  BP 129/65  Pulse 66  Temp(Src) 97.5 F (36.4 C) (Oral)  Resp 18  Ht 5\' 5"  (1.651 m)  Wt 180 lb (81.647 kg)  BMI 29.95 kg/m2  SpO2 100%  Discharge Labs:  Results for orders placed during the hospital encounter of 06/14/12 (from the past 24 hour(s))  TROPONIN I     Status: None   Collection Time    06/15/12 10:50 AM      Result Value Range   Troponin I <0.30  <0.30 ng/mL  TROPONIN I     Status: None   Collection Time    06/15/12  5:20 PM  Result Value Range   Troponin I <0.30  <0.30 ng/mL  TROPONIN I     Status: None   Collection Time    06/15/12 10:54 PM      Result Value Range   Troponin I <0.30  <0.30 ng/mL  BASIC METABOLIC PANEL     Status: Abnormal   Collection Time    06/16/12  7:18 AM      Result Value Range   Sodium 136  135 - 145 mEq/L   Potassium 4.6  3.5 - 5.1 mEq/L   Chloride 101  96 - 112 mEq/L   CO2 28  19 - 32 mEq/L   Glucose, Bld 82  70 - 99 mg/dL   BUN 30 (*) 6 - 23 mg/dL   Creatinine, Ser 1.61 (*) 0.50 - 1.35 mg/dL   Calcium 8.6  8.4 - 09.6 mg/dL   GFR calc non Af Amer 42 (*) >90 mL/min   GFR calc Af Amer 49 (*) >90 mL/min    Ref. Range 06/14/2012 16:54  RBC. Latest Range: 4.22-5.81 MIL/uL 3.52 (L)  Retic Ct Pct Latest Range: 0.4-3.1 % 1.2  Retic Count, Manual Latest Range: 19.0-186.0 K/uL 42.2    Ref. Range 06/14/2012 16:54  Vitamin B-12 Latest Range: 211-911 pg/mL 421    Ref. Range 06/14/2012 16:54  Folate No range found 14.7    Ref. Range 06/14/2012 16:54  Ferritin Latest Range: 22-322 ng/mL 50    Ref. Range 06/14/2012 11:21  TSH Latest Range: 0.350-4.500 uIU/mL 2.413    Signed: Marian Grandt 06/16/2012, 9:40 AM   Time Spent on Discharge: 

## 2012-06-19 LAB — PHENYTOIN LEVEL, FREE AND TOTAL
Phenytoin Bound: 2.2 mg/L
Phenytoin, Total: 2.7 mg/L — ABNORMAL LOW (ref 10.0–20.0)

## 2012-08-06 ENCOUNTER — Encounter (HOSPITAL_COMMUNITY): Payer: Self-pay | Admitting: *Deleted

## 2012-08-06 ENCOUNTER — Emergency Department (HOSPITAL_COMMUNITY)
Admission: EM | Admit: 2012-08-06 | Discharge: 2012-08-06 | Disposition: A | Discharge status: Home / Self Care | Payer: Medicare Other | Attending: Emergency Medicine | Admitting: Emergency Medicine

## 2012-08-06 ENCOUNTER — Emergency Department (HOSPITAL_COMMUNITY): Payer: Medicare Other

## 2012-08-06 DIAGNOSIS — R0602 Shortness of breath: Secondary | ICD-10-CM

## 2012-08-06 DIAGNOSIS — I2510 Atherosclerotic heart disease of native coronary artery without angina pectoris: Secondary | ICD-10-CM | POA: Insufficient documentation

## 2012-08-06 DIAGNOSIS — Z87891 Personal history of nicotine dependence: Secondary | ICD-10-CM | POA: Insufficient documentation

## 2012-08-06 DIAGNOSIS — N183 Chronic kidney disease, stage 3 unspecified: Secondary | ICD-10-CM | POA: Insufficient documentation

## 2012-08-06 DIAGNOSIS — D509 Iron deficiency anemia, unspecified: Secondary | ICD-10-CM | POA: Insufficient documentation

## 2012-08-06 DIAGNOSIS — Z7982 Long term (current) use of aspirin: Secondary | ICD-10-CM | POA: Insufficient documentation

## 2012-08-06 DIAGNOSIS — R05 Cough: Secondary | ICD-10-CM | POA: Insufficient documentation

## 2012-08-06 DIAGNOSIS — E785 Hyperlipidemia, unspecified: Secondary | ICD-10-CM | POA: Insufficient documentation

## 2012-08-06 DIAGNOSIS — G40909 Epilepsy, unspecified, not intractable, without status epilepticus: Secondary | ICD-10-CM | POA: Insufficient documentation

## 2012-08-06 DIAGNOSIS — I129 Hypertensive chronic kidney disease with stage 1 through stage 4 chronic kidney disease, or unspecified chronic kidney disease: Secondary | ICD-10-CM | POA: Insufficient documentation

## 2012-08-06 DIAGNOSIS — Z79899 Other long term (current) drug therapy: Secondary | ICD-10-CM | POA: Insufficient documentation

## 2012-08-06 DIAGNOSIS — I739 Peripheral vascular disease, unspecified: Secondary | ICD-10-CM | POA: Insufficient documentation

## 2012-08-06 DIAGNOSIS — Z8679 Personal history of other diseases of the circulatory system: Secondary | ICD-10-CM | POA: Insufficient documentation

## 2012-08-06 DIAGNOSIS — Z87448 Personal history of other diseases of urinary system: Secondary | ICD-10-CM | POA: Insufficient documentation

## 2012-08-06 DIAGNOSIS — Z951 Presence of aortocoronary bypass graft: Secondary | ICD-10-CM | POA: Insufficient documentation

## 2012-08-06 DIAGNOSIS — R059 Cough, unspecified: Secondary | ICD-10-CM | POA: Insufficient documentation

## 2012-08-06 DIAGNOSIS — I5022 Chronic systolic (congestive) heart failure: Secondary | ICD-10-CM | POA: Insufficient documentation

## 2012-08-06 DIAGNOSIS — E119 Type 2 diabetes mellitus without complications: Secondary | ICD-10-CM | POA: Insufficient documentation

## 2012-08-06 LAB — CBC WITH DIFFERENTIAL/PLATELET
Basophils Absolute: 0 10*3/uL (ref 0.0–0.1)
Basophils Relative: 1 % (ref 0–1)
Eosinophils Absolute: 0.2 10*3/uL (ref 0.0–0.7)
Eosinophils Relative: 4 % (ref 0–5)
MCH: 26.2 pg (ref 26.0–34.0)
MCHC: 31.2 g/dL (ref 30.0–36.0)
MCV: 84.1 fL (ref 78.0–100.0)
Monocytes Absolute: 0.5 10*3/uL (ref 0.1–1.0)
Platelets: 343 10*3/uL (ref 150–400)
RDW: 18.4 % — ABNORMAL HIGH (ref 11.5–15.5)
WBC: 4.1 10*3/uL (ref 4.0–10.5)

## 2012-08-06 LAB — POCT I-STAT 3, ART BLOOD GAS (G3+)
Acid-base deficit: 2 mmol/L (ref 0.0–2.0)
O2 Saturation: 96 %
Patient temperature: 98.6

## 2012-08-06 LAB — TROPONIN I: Troponin I: <0.3 ng/mL (ref <0.30)

## 2012-08-06 LAB — BASIC METABOLIC PANEL
BUN: 23 mg/dL (ref 6–23)
Creatinine, Ser: 1.27 mg/dL (ref 0.50–1.35)
GFR calc Af Amer: 65 mL/min — ABNORMAL LOW (ref >90)
GFR calc non Af Amer: 56 mL/min — ABNORMAL LOW (ref >90)
Glucose, Bld: 99 mg/dL (ref 70–99)

## 2012-08-06 MED ORDER — BENZONATATE 200 MG PO CAPS: 200.0000 mg | ORAL_CAPSULE | Freq: Three times a day (TID) | ORAL | Status: DC | PRN

## 2012-08-06 MED ORDER — ALBUTEROL SULFATE HFA 108 (90 BASE) MCG/ACT IN AERS
1.0000 | INHALATION_SPRAY | RESPIRATORY_TRACT | Status: DC | PRN
  Filled 2012-08-06: qty 6.7

## 2012-08-06 MED ORDER — BENZONATATE 100 MG PO CAPS
200.0000 mg | ORAL_CAPSULE | Freq: Three times a day (TID) | ORAL | Status: DC | PRN
  Administered 2012-08-06: 200 mg via ORAL
  Filled 2012-08-06: qty 2

## 2012-08-06 NOTE — ED Notes (Signed)
Pt currently sleeping

## 2012-08-06 NOTE — ED Notes (Signed)
Per EMS: pt coming from [facility] with c/o cough. Lung sounds clear, no respiratory distress noted. Pt slept the entire ride from [facility] to ED. Pt is A&Ox4, respirations equal and unlabored, skin warm and dry.

## 2012-08-06 NOTE — ED Provider Notes (Signed)
History     CSN: [id number]  Arrival date & time 08/06/12  1478   First MD Initiated Contact with Patient 08/06/12 0320      Chief Complaint  Patient presents with  . Cough    (Consider location/radiation/quality/duration/timing/severity/associated sxs/prior treatment) HPI 67 year old male presents to emergency room with complaint of cough, and shortness of breath.  Patient reports he was recently admitted to [facility] from Tuesday a week ago until this past Friday.  He reports he did well over the weekend.  Tonight around 2:30 AM he woke short of breath.  He denies any chest pain.  He reports he's had cough.  Patient reports he had some shortness of breath Tuesday in the morning, but resolved on its own.  Patient has history of CHF and COPD.  He denies smoking cigarettes.  He denies using any drugs.  Records received from hyper or regional show admission on 513 for acute congestive heart failure exacerbation, secondary to uncontrolled hypertension due to noncompliance of medication, as well as cocaine abuse.  Patient also noted to have acute renal failure. Past Medical History  Diagnosis Date  . Iron deficiency anemia   . Hypertension   . Chronic systolic heart failure     2D Echo (03/2010) - EF 35-40% with akinesis of inferoposterior myocardium  . CAD (coronary artery disease)     s/p 3-vessel CABG (12/2007) // 100% RCA stenosis with collaterals from left system. Severe bifurcation lesions of proxima CXA and OM. Moderate LAD diseaseFollowed by Dr. [facility]  . Hyperlipidemia LDL goal <70   . Obstructive sleep apnea     not on home CPAP  . Ischemic cardiomyopathy   . Chronic kidney disease (CKD), stage III (moderate)     BL SCr 1.5-1.6  . Homelessness   . 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 // Colonoscopy and EGD in 03/2010 negative per report (record not found)  .  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 Polysubstance abuse     cocaine, THC  . AV malformation of gastrointestinal tract   . Family history of early CAD   . DDD (degenerative disc disease), lumbar   . Renal artery stenosis   . Peripheral vascular disease   . Seizure disorder   . Diabetes     Past Surgical History  Procedure Laterality Date  . Coronary artery bypass graft  12/2007  . Apc  03/2010    To treat small bowel AVMs    Family History  Problem Relation Age of Onset  . Heart disease Mother     unknown type  . Heart disease Father 37    died of MI at 73yo  . Heart disease Paternal Grandfather 81    died of MI  . Heart disease    . Heart disease Brother 30    History  Substance Use Topics  . Smoking status: Former Smoker -- 0.20 packs/day for 13 years    Types: Cigarettes    Quit date: 03/20/1997  . Smokeless tobacco: Never Used  . Alcohol Use: Yes     Comment: occasionally drinks, a few times a month      Review of Systems  See History of Present Illness; otherwise all other systems are reviewed and negative  Allergies  Motrin and  Tylenol  Home Medications   Current Outpatient Rx  Name  Route  Sig  Dispense  Refill  . amLODipine (NORVASC) 2.5 MG tablet   Oral   Take 2.5 mg by mouth daily.         [name] aspirin 81 MG chewable tablet   Oral   Chew 81 mg by mouth daily.         . carvedilol (COREG) 12.5 MG tablet   Oral   Take 12.5 mg by mouth 2 (two) times daily with a meal.         . divalproex (DEPAKOTE ER) 500 MG 24 hr tablet   Oral   Take 500 mg by mouth 2 (two) times daily.         . ferrous sulfate 325 (65 FE) MG tablet   Oral   Take 325 mg by mouth 2 (two) times daily with a meal.         . furosemide (LASIX) 40 MG tablet   Oral   Take 40 mg by mouth daily.         . hydrALAZINE (APRESOLINE) 100 MG tablet   Oral   Take 100 mg by mouth every 8 (eight)  hours.         [name] lisinopril (PRINIVIL,ZESTRIL) 20 MG tablet   Oral   Take 20 mg by mouth 2 (two) times daily.         . phenytoin (DILANTIN) 100 MG ER capsule   Oral   Take 200-300 mg by mouth 2 (two) times daily. Take 2 capsules by mouth in the morning and 3 capsules by mouth at bedtime.         . potassium chloride (K-DUR,KLOR-CON) 10 MEQ tablet   Oral   Take 10 mEq by mouth daily.         . simvastatin (ZOCOR) 40 MG tablet   Oral   Take 40 mg by mouth every evening.         . traMADol (ULTRAM) 50 MG tablet   Oral   Take 50 mg by mouth daily.           BP 181/92  Pulse 67  Temp(Src) 97.6 F (36.4 C) (Oral)  Resp 20  SpO2 100%  Physical Exam  Nursing note and vitals reviewed. Constitutional: He is oriented to person, place, and time. He appears well-developed and well-nourished. No distress.  HENT:  Head: Normocephalic and atraumatic.  Nose: Nose normal.  Mouth/Throat: Oropharynx is clear and moist.  Eyes: Conjunctivae and EOM are normal. Pupils are equal, round, and reactive to light.  Neck: Normal range of motion. Neck supple. No JVD present. No tracheal deviation present. No thyromegaly present.  Cardiovascular: Normal rate, regular rhythm, normal heart sounds and intact distal pulses.  Exam reveals no gallop and no friction rub.   No murmur heard. Pulmonary/Chest: Effort normal and breath sounds normal. No stridor. No respiratory distress. He has no wheezes. He has no rales. He exhibits no tenderness.  Abdominal: Soft. Bowel sounds are normal. He exhibits no distension and no mass. There is no tenderness. There is no rebound and no guarding.  Musculoskeletal: Normal range of motion. He exhibits no edema and no tenderness.  Lymphadenopathy:    He has no cervical adenopathy.  Neurological: He is alert and oriented to person, place, and time. He exhibits normal muscle tone. Coordination normal.  Skin: Skin is warm and dry. No rash noted. No erythema. No  pallor.  Psychiatric: He  has a normal mood and affect. His behavior is normal. Judgment and thought content normal.    ED Course  Procedures (including critical care time)  Labs Reviewed  CBC WITH DIFFERENTIAL - Abnormal; Notable for the following:    RBC 3.28 (*)    Hemoglobin 8.6 (*)    HCT 27.6 (*)    RDW 18.4 (*)    All other components within normal limits  PRO B NATRIURETIC PEPTIDE - Abnormal; Notable for the following:    Pro B Natriuretic peptide (BNP) 807.8 (*)    All other components within normal limits  BASIC METABOLIC PANEL - Abnormal; Notable for the following:    GFR calc non Af Amer 56 (*)    GFR calc Af Amer 65 (*)    All other components within normal limits  POCT I-STAT 3, BLOOD GAS (G3+) - Abnormal; Notable for the following:    pCO2 arterial 34.3 (*)    pO2, Arterial 77.0 (*)    All other components within normal limits  TROPONIN I   Dg Chest 2 View  08/06/2012   *RADIOLOGY REPORT*  Clinical Data: Chest pain.  CHEST - 2 VIEW  Comparison: 06/14/2012.  Findings: The heart is enlarged but stable.  The mediastinal and hilar contours are unchanged.  There are surgical changes from bypass surgery.  Chronic underlying lung changes with overlying vascular congestion but no overt pulmonary edema or pleural effusion.  The bony thorax is intact.  IMPRESSION: Cardiac enlargement and vascular congestion superimposed on underlying chronic lung changes.  No overt pulmonary edema or definite pleural effusions.   Original Report Authenticated By: Rudie Meyer, M.D.    Date: 08/06/2012  Rate: 61  Rhythm: normal sinus rhythm  QRS Axis: normal  Intervals: normal  ST/T Wave abnormalities: normal  Conduction Disutrbances:nonspecific intraventricular conduction delay  Narrative Interpretation:   Old EKG Reviewed: unchanged    1. Cough   2. Shortness of breath       MDM  67 year old male with shortness of breath.  None noted on exam.  He does have a dry cough.  He should  noted to have anemia, compared to his last values here, but stable from his most recent discharge.  No significant pulmonary edema noted on chest x-ray, and no infiltrate.[name]  His blood gas, does not show CO2 retainer.  We'll plan to discharge back to [facility].        Olivia Mackie, MD 08/06/12 [phone]

## 2012-08-13 ENCOUNTER — Other Ambulatory Visit: Payer: Self-pay

## 2012-08-13 ENCOUNTER — Inpatient Hospital Stay (HOSPITAL_COMMUNITY)
Admission: EM | Admit: 2012-08-13 | Discharge: 2012-08-16 | DRG: 377 | Disposition: A | Discharge status: Home / Self Care | Payer: Medicare Other | Attending: Internal Medicine | Admitting: Internal Medicine

## 2012-08-13 ENCOUNTER — Encounter (HOSPITAL_COMMUNITY): Payer: Self-pay | Admitting: Emergency Medicine

## 2012-08-13 ENCOUNTER — Emergency Department (HOSPITAL_COMMUNITY): Payer: Medicare Other

## 2012-08-13 DIAGNOSIS — Z5900 Homelessness unspecified: Secondary | ICD-10-CM

## 2012-08-13 DIAGNOSIS — M5137 Other intervertebral disc degeneration, lumbosacral region: Secondary | ICD-10-CM | POA: Diagnosis present

## 2012-08-13 DIAGNOSIS — Z79899 Other long term (current) drug therapy: Secondary | ICD-10-CM

## 2012-08-13 DIAGNOSIS — F1410 Cocaine abuse, uncomplicated: Secondary | ICD-10-CM | POA: Diagnosis present

## 2012-08-13 DIAGNOSIS — K31811 Angiodysplasia of stomach and duodenum with bleeding: Principal | ICD-10-CM | POA: Diagnosis present

## 2012-08-13 DIAGNOSIS — I129 Hypertensive chronic kidney disease with stage 1 through stage 4 chronic kidney disease, or unspecified chronic kidney disease: Secondary | ICD-10-CM | POA: Diagnosis present

## 2012-08-13 DIAGNOSIS — Z951 Presence of aortocoronary bypass graft: Secondary | ICD-10-CM

## 2012-08-13 DIAGNOSIS — K922 Gastrointestinal hemorrhage, unspecified: Secondary | ICD-10-CM

## 2012-08-13 DIAGNOSIS — D500 Iron deficiency anemia secondary to blood loss (chronic): Secondary | ICD-10-CM | POA: Diagnosis present

## 2012-08-13 DIAGNOSIS — Z7982 Long term (current) use of aspirin: Secondary | ICD-10-CM

## 2012-08-13 DIAGNOSIS — N183 Chronic kidney disease, stage 3 unspecified: Secondary | ICD-10-CM | POA: Diagnosis present

## 2012-08-13 DIAGNOSIS — M51379 Other intervertebral disc degeneration, lumbosacral region without mention of lumbar back pain or lower extremity pain: Secondary | ICD-10-CM | POA: Diagnosis present

## 2012-08-13 DIAGNOSIS — I498 Other specified cardiac arrhythmias: Secondary | ICD-10-CM | POA: Diagnosis present

## 2012-08-13 DIAGNOSIS — Z87891 Personal history of nicotine dependence: Secondary | ICD-10-CM

## 2012-08-13 DIAGNOSIS — D509 Iron deficiency anemia, unspecified: Secondary | ICD-10-CM | POA: Diagnosis present

## 2012-08-13 DIAGNOSIS — D649 Anemia, unspecified: Secondary | ICD-10-CM

## 2012-08-13 DIAGNOSIS — E119 Type 2 diabetes mellitus without complications: Secondary | ICD-10-CM | POA: Diagnosis present

## 2012-08-13 DIAGNOSIS — N289 Disorder of kidney and ureter, unspecified: Secondary | ICD-10-CM

## 2012-08-13 DIAGNOSIS — F1210 Cannabis abuse, uncomplicated: Secondary | ICD-10-CM | POA: Diagnosis present

## 2012-08-13 DIAGNOSIS — R001 Bradycardia, unspecified: Secondary | ICD-10-CM | POA: Diagnosis present

## 2012-08-13 DIAGNOSIS — I2589 Other forms of chronic ischemic heart disease: Secondary | ICD-10-CM | POA: Diagnosis present

## 2012-08-13 DIAGNOSIS — R079 Chest pain, unspecified: Secondary | ICD-10-CM

## 2012-08-13 DIAGNOSIS — I5023 Acute on chronic systolic (congestive) heart failure: Secondary | ICD-10-CM

## 2012-08-13 DIAGNOSIS — D62 Acute posthemorrhagic anemia: Secondary | ICD-10-CM | POA: Diagnosis present

## 2012-08-13 DIAGNOSIS — I2510 Atherosclerotic heart disease of native coronary artery without angina pectoris: Secondary | ICD-10-CM | POA: Diagnosis present

## 2012-08-13 DIAGNOSIS — I252 Old myocardial infarction: Secondary | ICD-10-CM

## 2012-08-13 DIAGNOSIS — Z8249 Family history of ischemic heart disease and other diseases of the circulatory system: Secondary | ICD-10-CM

## 2012-08-13 DIAGNOSIS — G4733 Obstructive sleep apnea (adult) (pediatric): Secondary | ICD-10-CM | POA: Diagnosis present

## 2012-08-13 DIAGNOSIS — G40909 Epilepsy, unspecified, not intractable, without status epilepticus: Secondary | ICD-10-CM | POA: Diagnosis present

## 2012-08-13 DIAGNOSIS — K449 Diaphragmatic hernia without obstruction or gangrene: Secondary | ICD-10-CM | POA: Diagnosis present

## 2012-08-13 DIAGNOSIS — I2789 Other specified pulmonary heart diseases: Secondary | ICD-10-CM | POA: Diagnosis present

## 2012-08-13 DIAGNOSIS — E785 Hyperlipidemia, unspecified: Secondary | ICD-10-CM | POA: Diagnosis present

## 2012-08-13 DIAGNOSIS — I509 Heart failure, unspecified: Secondary | ICD-10-CM

## 2012-08-13 HISTORY — DX: Acute myocardial infarction, unspecified: I21.9

## 2012-08-13 HISTORY — DX: Unspecified convulsions: R56.9

## 2012-08-13 LAB — CBC
HCT: 23.7 % — ABNORMAL LOW (ref 39.0–52.0)
HCT: 27 % — ABNORMAL LOW (ref 39.0–52.0)
Hemoglobin: 7.4 g/dL — ABNORMAL LOW (ref 13.0–17.0)
Hemoglobin: 8.5 g/dL — ABNORMAL LOW (ref 13.0–17.0)
MCH: 26.8 pg (ref 26.0–34.0)
MCHC: 31.2 g/dL (ref 30.0–36.0)
MCV: 85.9 fL (ref 78.0–100.0)
Platelets: 304 K/uL (ref 150–400)
RBC: 2.76 MIL/uL — ABNORMAL LOW (ref 4.22–5.81)
RDW: 18.9 % — ABNORMAL HIGH (ref 11.5–15.5)
RDW: 19 % — ABNORMAL HIGH (ref 11.5–15.5)
WBC: 4 K/uL (ref 4.0–10.5)
WBC: 4.2 10*3/uL (ref 4.0–10.5)

## 2012-08-13 LAB — BASIC METABOLIC PANEL
Calcium: 8.2 mg/dL — ABNORMAL LOW (ref 8.4–10.5)
GFR calc Af Amer: 73 mL/min — ABNORMAL LOW (ref >90)
GFR calc non Af Amer: 63 mL/min — ABNORMAL LOW (ref >90)
Sodium: 138 mEq/L (ref 135–145)

## 2012-08-13 LAB — PREPARE RBC (CROSSMATCH)

## 2012-08-13 LAB — POCT I-STAT 3, ART BLOOD GAS (G3+)
Acid-base deficit: 2 mmol/L (ref 0.0–2.0)
Bicarbonate: 23.4 meq/L (ref 20.0–24.0)
O2 Saturation: 99 %
TCO2: 25 mmol/L (ref 0–100)
pCO2 arterial: 40.9 mmHg (ref 35.0–45.0)
pH, Arterial: 7.365 (ref 7.350–7.450)
pO2, Arterial: 124 mmHg — ABNORMAL HIGH (ref 80.0–100.0)

## 2012-08-13 LAB — BASIC METABOLIC PANEL WITH GFR
BUN: 21 mg/dL (ref 6–23)
CO2: 21 meq/L (ref 19–32)
Chloride: 107 meq/L (ref 96–112)
Creatinine, Ser: 1.16 mg/dL (ref 0.50–1.35)
Glucose, Bld: 97 mg/dL (ref 70–99)
Potassium: 4.1 meq/L (ref 3.5–5.1)

## 2012-08-13 LAB — TROPONIN I
Troponin I: <0.3 ng/mL (ref <0.30)
Troponin I: <0.3 ng/mL (ref <0.30)

## 2012-08-13 LAB — POCT I-STAT TROPONIN I: Troponin i, poc: 0.03 ng/mL (ref 0.00–0.08)

## 2012-08-13 LAB — OCCULT BLOOD, POC DEVICE: Fecal Occult Bld: POSITIVE — AB

## 2012-08-13 LAB — PRO B NATRIURETIC PEPTIDE: Pro B Natriuretic peptide (BNP): 862.5 pg/mL — ABNORMAL HIGH (ref 0–125)

## 2012-08-13 MED ORDER — ATORVASTATIN CALCIUM 20 MG PO TABS
20.0000 mg | ORAL_TABLET | Freq: Every day | ORAL | Status: DC
  Administered 2012-08-13 – 2012-08-15 (×3): 20 mg via ORAL
  Filled 2012-08-13 (×4): qty 1

## 2012-08-13 MED ORDER — ONDANSETRON HCL 4 MG PO TABS: 4.0000 mg | ORAL_TABLET | Freq: Four times a day (QID) | ORAL | Status: DC | PRN

## 2012-08-13 MED ORDER — ONDANSETRON HCL 4 MG/2ML IJ SOLN: 4.0000 mg | Freq: Four times a day (QID) | INTRAMUSCULAR | Status: DC | PRN

## 2012-08-13 MED ORDER — PHENYTOIN SODIUM EXTENDED 100 MG PO CAPS: 200.0000 mg | ORAL_CAPSULE | Freq: Two times a day (BID) | ORAL | Status: DC

## 2012-08-13 MED ORDER — PANTOPRAZOLE SODIUM 40 MG IV SOLR
40.0000 mg | Freq: Two times a day (BID) | INTRAVENOUS | Status: DC
  Administered 2012-08-13 – 2012-08-15 (×5): 40 mg via INTRAVENOUS
  Filled 2012-08-13 (×7): qty 40

## 2012-08-13 MED ORDER — PANTOPRAZOLE SODIUM 40 MG IV SOLR
40.0000 mg | Freq: Once | INTRAVENOUS | Status: AC
  Administered 2012-08-13: 40 mg via INTRAVENOUS
  Filled 2012-08-13: qty 40

## 2012-08-13 MED ORDER — FUROSEMIDE 40 MG PO TABS
40.0000 mg | ORAL_TABLET | Freq: Every day | ORAL | Status: DC
  Administered 2012-08-14 – 2012-08-16 (×3): 40 mg via ORAL
  Filled 2012-08-13 (×4): qty 1

## 2012-08-13 MED ORDER — ALUM & MAG HYDROXIDE-SIMETH 200-200-20 MG/5ML PO SUSP: 30.0000 mL | Freq: Four times a day (QID) | ORAL | Status: DC | PRN

## 2012-08-13 MED ORDER — PHENYTOIN SODIUM EXTENDED 100 MG PO CAPS
200.0000 mg | ORAL_CAPSULE | Freq: Every day | ORAL | Status: DC
  Administered 2012-08-13 – 2012-08-16 (×4): 200 mg via ORAL
  Filled 2012-08-13 (×4): qty 2

## 2012-08-13 MED ORDER — ASPIRIN 81 MG PO CHEW
162.0000 mg | CHEWABLE_TABLET | Freq: Once | ORAL | Status: AC
  Administered 2012-08-13: 162 mg via ORAL
  Filled 2012-08-13: qty 4

## 2012-08-13 MED ORDER — FENTANYL CITRATE 0.05 MG/ML IJ SOLN
25.0000 ug | Freq: Once | INTRAMUSCULAR | Status: AC
  Administered 2012-08-13: 08:00:00 via INTRAVENOUS
  Filled 2012-08-13: qty 2

## 2012-08-13 MED ORDER — AMLODIPINE BESYLATE 2.5 MG PO TABS
2.5000 mg | ORAL_TABLET | Freq: Every day | ORAL | Status: DC
  Administered 2012-08-13 – 2012-08-16 (×4): 2.5 mg via ORAL
  Filled 2012-08-13 (×5): qty 1

## 2012-08-13 MED ORDER — CHLORHEXIDINE GLUCONATE CLOTH 2 % EX PADS
6.0000 | MEDICATED_PAD | Freq: Every day | CUTANEOUS | Status: DC
  Administered 2012-08-14 – 2012-08-15 (×2): 6 via TOPICAL

## 2012-08-13 MED ORDER — FUROSEMIDE 10 MG/ML IJ SOLN
20.0000 mg | Freq: Once | INTRAMUSCULAR | Status: AC
  Administered 2012-08-13: 20 mg via INTRAVENOUS
  Filled 2012-08-13: qty 2

## 2012-08-13 MED ORDER — CARVEDILOL 3.125 MG PO TABS
3.1250 mg | ORAL_TABLET | Freq: Two times a day (BID) | ORAL | Status: DC
  Administered 2012-08-13 – 2012-08-16 (×6): 3.125 mg via ORAL
  Filled 2012-08-13 (×9): qty 1

## 2012-08-13 MED ORDER — NITROGLYCERIN 0.4 MG SL SUBL
0.4000 mg | SUBLINGUAL_TABLET | SUBLINGUAL | Status: AC | PRN
  Administered 2012-08-13 (×2): 0.4 mg via SUBLINGUAL
  Filled 2012-08-13: qty 25

## 2012-08-13 MED ORDER — SIMVASTATIN 40 MG PO TABS: 40.0000 mg | ORAL_TABLET | Freq: Every evening | ORAL | Status: DC

## 2012-08-13 MED ORDER — MUPIROCIN 2 % EX OINT
1.0000 "application " | TOPICAL_OINTMENT | Freq: Two times a day (BID) | CUTANEOUS | Status: DC
  Administered 2012-08-13 – 2012-08-16 (×7): 1 via NASAL
  Filled 2012-08-13 (×2): qty 22

## 2012-08-13 MED ORDER — DIVALPROEX SODIUM ER 500 MG PO TB24
500.0000 mg | ORAL_TABLET | Freq: Two times a day (BID) | ORAL | Status: DC
  Administered 2012-08-13 – 2012-08-16 (×7): 500 mg via ORAL
  Filled 2012-08-13 (×8): qty 1

## 2012-08-13 MED ORDER — PHENYTOIN SODIUM EXTENDED 100 MG PO CAPS
300.0000 mg | ORAL_CAPSULE | Freq: Every day | ORAL | Status: DC
  Administered 2012-08-13 – 2012-08-15 (×3): 300 mg via ORAL
  Filled 2012-08-13 (×4): qty 3

## 2012-08-13 NOTE — Progress Notes (Signed)
PCR + MRSA, orders placed per protocol, Berle Mull RN

## 2012-08-13 NOTE — ED Notes (Signed)
Patient arrived via EMS with shortness of breath and chest pressure.  Patient states "I woke up and could catch my breath, I been coughing a lot lately, and my chest hurts"  Patient described pain as a pressure mid-chest

## 2012-08-13 NOTE — H&P (Signed)
Hospital Admission Note Date: 08/13/2012  Patient name: Charles [name] Medical record number: [id number] Date of birth: 23-Oct-1945 Age: 67 y.o. Gender: male PCP: Bernerd Limbo  Medical Service:Internal Medicine  Attending physician: Dr. Criselda Peaches    1st Contact: Sherrine Maples    Pager: [phone] 2nd Contact: Sharda    Pager: [phone] After 5 pm or weekends: 1st Contact:      Pager: [phone] 2nd Contact:      Pager: [phone]  Chief Complaint: chest pain, SOB  History of Present Illness: 67 year old man with past medical history significant CAD s/p CABG in 2009, CHF with EF of 35- 40% from echo in 2012, history of AVM s/p capsule endoscopy  in 2012, with work up including EGD  2012 that showed hiatal hernia , colonoscopy in 2012 that showed hemorrhoids presents to the ED with chest pain and SOB.   Patient reports that he has been having black stools for last 4 days. He was not feeling well, since yesterday when he was feeling tired and was sleeping more than usual. He woke up this morning at 5: 30 AM with chest pain. He describes his chest pain was sharp at the beginning , 10/10 and now it has subsided a little and describes it more like a dull ache , rates 6/10 , located in the lower sternal area. He also reports associated SOB and nausea. But denies any vomiting or diaphoresis. He does report some baseline SOB and states that his SOB is bad today but not at its worst. He is noted to have lower extremity edema but he can not tell for sure if it is getting worse. He also reports that he also felt dizzy ( lightheaded ) and fell this AM but is not sure if he lost conscious. He denies any head trauma. Also denies any hematemesis, seizure like activity, vomiting ,palpiations.   He was seen in the ED on 5/20 for cough and SOB - was d/c home with stable work up ( labs and imaging).      Meds: No current facility-administered medications for this encounter.   Current Outpatient Prescriptions  Medication  Sig Dispense Refill  . amLODipine (NORVASC) 2.5 MG tablet Take 2.5 mg by mouth daily.      [name] aspirin 81 MG chewable tablet Chew 81 mg by mouth daily.      . benzonatate (TESSALON) 200 MG capsule Take 1 capsule (200 mg total) by mouth 3 (three) times daily as needed for cough.  20 capsule  0  . carvedilol (COREG) 12.5 MG tablet Take 12.5 mg by mouth 2 (two) times daily with a meal.      . divalproex (DEPAKOTE ER) 500 MG 24 hr tablet Take 500 mg by mouth 2 (two) times daily.      . ferrous sulfate 325 (65 FE) MG tablet Take 325 mg by mouth 2 (two) times daily with a meal.      . furosemide (LASIX) 40 MG tablet Take 40 mg by mouth daily.      . hydrALAZINE (APRESOLINE) 100 MG tablet Take 100 mg by mouth every 8 (eight) hours.      [name] lisinopril (PRINIVIL,ZESTRIL) 20 MG tablet Take 20 mg by mouth 2 (two) times daily.      . phenytoin (DILANTIN) 100 MG ER capsule Take 200-300 mg by mouth 2 (two) times daily. Take 2 capsules by mouth in the morning and 3 capsules by mouth at bedtime.      . potassium chloride (  K-DUR,KLOR-CON) 10 MEQ tablet Take 10 mEq by mouth daily.      . simvastatin (ZOCOR) 40 MG tablet Take 40 mg by mouth every evening.      . traMADol (ULTRAM) 50 MG tablet Take 50 mg by mouth daily.        Allergies: Allergies as of 08/13/2012 - Review Complete 08/13/2012  Allergen Reaction Noted  . Motrin (ibuprofen) Hives and Nausea And Vomiting 05/16/2010  . Tylenol (acetaminophen) Hives and Nausea And Vomiting 05/16/2010   Past Medical History  Diagnosis Date  . Iron deficiency anemia   . Hypertension   . Chronic systolic heart failure     2D Echo (03/2010) - EF 35-40% with akinesis of inferoposterior myocardium  . CAD (coronary artery disease)     s/p 3-vessel CABG (12/2007) // 100% RCA stenosis with collaterals from left system. Severe bifurcation lesions of proxima CXA and OM. Moderate LAD diseaseFollowed by Dr[facility]  . Hyperlipidemia LDL goal <70   . Obstructive  sleep apnea     not on home CPAP  . Ischemic cardiomyopathy   . Chronic kidney disease (CKD), stage III (moderate)     BL SCr 1.5-1.6  . Homelessness   . 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 // Colonoscopy and EGD in 03/2010 negative per report (record not found)  . 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 Polysubstance abuse     cocaine, THC  . AV malformation of gastrointestinal tract   . Family history of early CAD   . DDD (degenerative disc disease), lumbar   . Renal artery stenosis   . Peripheral vascular disease   . Seizure disorder   . Diabetes   . Acute on chronic systolic CHF (congestive heart failure) 08/13/2012   Past Surgical History  Procedure Laterality Date  . Coronary artery bypass graft  12/2007  . Apc  03/2010    To treat small bowel AVMs   Family History  Problem Relation Age of Onset  . Heart disease Mother     unknown type  . Heart disease Father 7    died of MI at 72yo  . Heart disease Paternal Grandfather 77    died of MI  . Heart disease    . Heart disease Brother 30   History   Social History  . Marital Status: Single    Spouse Name: N/A    Number of Children: 2  . Years of Education: N/A   Occupational History  . Unemployed     previously worked in [employer]  .     Social History Main Topics  . Smoking status: Former Smoker -- 0.20 packs/day for 13 years    Types: Cigarettes    Quit date: 03/20/1997  . Smokeless tobacco: Never Used  . Alcohol Use: Yes     Comment: occasionally drinks, a few times a month  . Drug Use: Yes    Special: Cocaine, Marijuana     Comment: cocaine, MJ - indicates he last used 2013  . Sexually Active: No   Other Topics Concern  . Not on file   Social History Narrative   Lives in [facility] shelter. Originally from [place], has been in  the [place] since 1980s  Review of Systems: Constitutional: negative for anorexia, chills, fatigue, fevers, malaise and night sweats HEENT: negative for earaches, epistaxis, hearing loss, nasal congestion, snoring and sore throat Respiratory: negative for cough, dyspnea on exertion, sputum, stridor and wheezing Cardiovascular: negative for  irregular heart beat, palpitations and paroxysmal nocturnal dyspnea. Positive for chest pain, orthopnea, lower extremity swelling  Gastrointestinal: negative for abdominal pain, change in bowel habits, dyspepsia, reflux symptoms and vomiting Hematologic/lymphatic: negative for bleeding, easy bruising and lymphadenopathy Musculoskeletal:negative for arthralgias, muscle weakness, myalgias and stiff joints Neurological: negative for coordination problems, dizziness, gait problems, seizures and speech problems  Physical Exam: Blood pressure 168/87, pulse 60, temperature 98.1 F (36.7 C), temperature source Oral, resp. rate 20, height 5\' 5"  (1.651 m), weight 190 lb (86.183 kg), SpO2 100.00%. BP 168/87  Pulse 60  Temp(Src) 98.1 F (36.7 C) (Oral)  Resp 20  Ht 5\' 5"  (1.651 m)  Wt 190 lb (86.183 kg)  BMI 31.62 kg/m2  SpO2 100%  General Appearance:    Alert, cooperative, no distress, appears stated age  Head:    Normocephalic, without obvious abnormality, atraumatic  Eyes:    PERRL, conjunctiva/corneas clear, EOM's intact, fundi    benign, both eyes       Ears:    Normal TM's and external ear canals, both ears  Nose:   Nares normal, septum midline, mucosa normal, no drainage    or sinus tenderness  Throat:   Lips, mucosa, and tongue normal; teeth and gums normal  Neck:   Supple, symmetrical, trachea midline, JVD +     thyroid:  No enlargement/tenderness/nodules; no carotid   bruit or JVD  Back:     Symmetric, no curvature, ROM normal, no CVA tenderness  Lungs:     Clear to auscultation bilaterally, respirations unlabored  Chest  wall:    Tender to palpation in the lower third of sternum.   Heart:    Regular rate and rhythm, S1 and S2 normal, no murmur, rub   or gallop  Abdomen:     Soft, tender to palpation in the epigastrium, bowel sounds active all four quadrants,    no masses, no organomegaly  Genitalia:    Normal male without lesion, discharge or tenderness  Rectal:    Normal tone, normal prostate, no masses or tenderness;   guaiac positive stool  Extremities:   2+ pitting edema upto mid- calf.   Pulses:   2+ and symmetric all extremities  Skin:   Skin color, texture, turgor normal, no rashes or lesions  Lymph nodes:   Cervical, supraclavicular, and axillary nodes normal  Neurologic:   CNII-XII intact. Normal strength, sensation and reflexes      throughout    Lab results: Basic Metabolic Panel:  Recent Labs  62/13/08 0719  NA 138  K 4.1  CL 107  CO2 21  GLUCOSE 97  BUN 21  CREATININE 1.16  CALCIUM 8.2*   CBC:  Recent Labs  08/13/12 0719  WBC 4.0  HGB 7.4*  HCT 23.7*  MCV 85.9  PLT 304   BNP:  Recent Labs  08/13/12 0720  PROBNP 862.5*   Urine Drug Screen: Drugs of Abuse     Component Value Date/Time   LABOPIA POSITIVE* 06/14/2012 1112   COCAINSCRNUR NONE DETECTED 06/14/2012 1112   LABBENZ NONE DETECTED 06/14/2012 1112   AMPHETMU NONE DETECTED 06/14/2012 1112   THCU NONE DETECTED 06/14/2012 1112   LABBARB NONE DETECTED 06/14/2012 1112      Imaging results:  Dg  Chest Port 1 View  08/13/2012   *RADIOLOGY REPORT*  Clinical Data: Chest pain and shortness of breath.  PORTABLE CHEST - 1 VIEW  Comparison: Chest 08/06/2012.  Findings: There is cardiomegaly and vascular congestion. Subsegmental atelectasis is seen in the lung bases.  No pneumothorax.  IMPRESSION: Cardiomegaly and pulmonary vascular congestion.   Original Report Authenticated By: Holley Dexter, M.D.    Other results: EKG:  normal sinus rhythm, HR -61 bpm, normal axis, q waves in II, III, avF, non specific  ST- T wave  changes, unchanged from previous tracings from 08/06/12.  Assessment & Plan by Problem: Principal Problem:   Chest pain, midsternal Active Problems:   Iron deficiency anemia   CAD (coronary artery disease)   Ischemic cardiomyopathy   Chronic kidney disease (CKD), stage III (moderate)   Seizure disorder   Acute on chronic systolic CHF (congestive heart failure)  #Chest pain: Patient woke up this morning with substernal chest pain. Differentials for his chest pain include demand ischemia from anemia vs ACS( given his h/o CAD s/p CABG) vs atrial stretching from CHF  Exaceberation. His EKG is unchanged as compared to old tracings. He does not have new ST- T wave changes. PC troponin's are negative. CXr shows vascular congestion . I think his chest pain is likely related to CHF exa from his anemia.  - Admit to step down unit - Cycle CEx3 - Repeat EKG in AM - Protonix  - Cautious with diuresis given concomitant GI bleed.   #Acute on chronic systolic CHF exaceberation: likely 2/2 anemia.  Echo from 2012 shows EF of 35- 40%. His exam is consistent with volume overload with pedal edema and JVD. CXR shows cardiomegaly and pulmonary vascular congestion.  - s/p one dose of 20 mg IV lasix - Would repeat 1 more dose of lasix ( 40 mg IV, if BP tolerates) after the blood transfusion - Strict I and O's - daily weights  #Acute on chronic anemia: His baseline Hb~ 9.5 . He presents with Hb of 7.4 with the h/o black stools for 4 days. He also has a history of GI bleed in 2012 that was thought to be likely 2/2 proximal intestine AVM s/p . He also had EGD that showed hiatal hernia and colonoscopy that showed hemorrhoids.  - Trend CBC - Transfuse 1 unit PRBC -  Protonix IV 40 mg BID -  Clear liquid diet for now -  Hold ASA -  GI consult   # Fall: Patient reports falling this AM. It is unclear if he lost conscious. It could be related to dizziness from his worsening anemia.  - Check orthostatics - PT/OT  consult  #Seizure disorder: Continue home meds- dilantin and depakote.  # CAD s/p CABG: Holding ASA  and lisinopril with his concomitant GI bleed. Would continue coreg but at [facility] low dose with holding parameters ( not to give with SBP < 90 mm Hg).   #HTN: Hold BP meds ( hydralazine , lisinopril ) in setting of active GI bleed.   #CKD stage III: Baseline Cr is 1.5- 1.6. He presented with Cr of 1.16. - Trend BMET  #DVT: SCD's  Dispo: Disposition is deferred at this time, awaiting improvement of current medical problems. Anticipated discharge in approximately 2-3 day(s).   The patient does have a current PCP (MITCHELL, JOHN), therefore will be requiring OPC follow-up after discharge.   The patient does not have transportation limitations that hinder transportation to clinic appointments.  Signed: Jael Kostick 08/13/2012, 10:04 AM

## 2012-08-13 NOTE — Consult Note (Signed)
Reason for Consult: Melena and anemia. Referring Physician: Teaching Service  Aileen Fass HPI: This is a 67 year old male with a PMH of CHF, CAD, and GI bleeding from proximal small bowel AVMs who was admitted with complaints of chest pain and black stools.  He states having black stools for the past four days and he also has accompanying weakness with SOB.  In 03/2010 he was evaluated for his GI bleed.  At that time he was seen by [facility] as an unassigned patient.  The EGD/Colonoscopy was negative for any abnormalities and then a capsule endoscopy was pursued.  At that time blood was identified in the proximal small bowel.  Dr. Christella Hartigan pursued an enteroscopy and found three nonbleeding AVMs and he ablated the lesions with APC.  Subsequently his HGB stabilized and he has not had any further issues with bleeding until now.  His HGB baseline ranges between the 9-11 range, but now it is in the 7 range.  Past Medical History  Diagnosis Date  . Iron deficiency anemia   . Hypertension   . Chronic systolic heart failure     2D Echo (03/2010) - EF 35-40% with akinesis of inferoposterior myocardium  . CAD (coronary artery disease)     s/p 3-vessel CABG (12/2007) // 100% RCA stenosis with collaterals from left system. Severe bifurcation lesions of proxima CXA and OM. Moderate LAD diseaseFollowed by Dr. [facility] in [facility]  . Hyperlipidemia LDL goal <70   . Obstructive sleep apnea     not on home CPAP  . Ischemic cardiomyopathy   . Chronic kidney disease (CKD), stage III (moderate)     BL SCr 1.5-1.6  . Homelessness   . 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 // Colonoscopy and EGD in 03/2010 negative per report (record not found)  . 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 Polysubstance abuse     cocaine, THC  . AV malformation of  gastrointestinal tract   . Family history of early CAD   . DDD (degenerative disc disease), lumbar   . Renal artery stenosis   . Peripheral vascular disease   . Seizure disorder   . Diabetes   . Acute on chronic systolic CHF (congestive heart failure) 08/13/2012  . Myocardial infarction     Past Surgical History  Procedure Laterality Date  . Coronary artery bypass graft  12/2007  . Apc  03/2010    To treat small bowel AVMs    Family History  Problem Relation Age of Onset  . Heart disease Mother     unknown type  . Heart disease Father 64    died of MI at 74yo  . Heart disease Paternal Grandfather 37    died of MI  . Heart disease    . Heart disease Brother 24    Social History:  reports that he quit smoking about 15 years ago. His smoking use included Cigarettes. He has a 2.6 pack-year smoking history. He has never used smokeless tobacco. He reports that  drinks alcohol. He reports that he uses illicit drugs (Cocaine and Marijuana).  Allergies:  Allergies  Allergen Reactions  . Motrin (Ibuprofen) Hives and Nausea And Vomiting  . Tylenol (Acetaminophen) Hives and Nausea And Vomiting    Medications:  Scheduled: . amLODipine  2.5 mg  Oral Daily  . atorvastatin  20 mg Oral q1800  . carvedilol  3.125 mg Oral BID WC  . Chlorhexidine Gluconate Cloth  6 each Topical Q0600  . divalproex  500 mg Oral BID  . mupirocin ointment  1 application Nasal BID  . pantoprazole (PROTONIX) IV  40 mg Intravenous Q12H  . phenytoin  200 mg Oral Daily  . phenytoin  300 mg Oral QHS   Continuous:   Results for orders placed during the hospital encounter of 08/13/12 (from the past 24 hour(s))  CBC     Status: Abnormal   Collection Time    08/13/12  7:19 AM      Result Value Range   WBC 4.0  4.0 - 10.5 K/uL   RBC 2.76 (*) 4.22 - 5.81 MIL/uL   Hemoglobin 7.4 (*) 13.0 - 17.0 g/dL   HCT 96.0 (*) 45.4 - 09.8 %   MCV 85.9  78.0 - 100.0 fL   MCH 26.8  26.0 - 34.0 pg   MCHC 31.2  30.0 - 36.0  g/dL   RDW 11.9 (*) 14.7 - 82.9 %   Platelets 304  150 - 400 K/uL  BASIC METABOLIC PANEL     Status: Abnormal   Collection Time    08/13/12  7:19 AM      Result Value Range   Sodium 138  135 - 145 mEq/L   Potassium 4.1  3.5 - 5.1 mEq/L   Chloride 107  96 - 112 mEq/L   CO2 21  19 - 32 mEq/L   Glucose, Bld 97  70 - 99 mg/dL   BUN 21  6 - 23 mg/dL   Creatinine, Ser 5.62  0.50 - 1.35 mg/dL   Calcium 8.2 (*) 8.4 - 10.5 mg/dL   GFR calc non Af Amer 63 (*) >90 mL/min   GFR calc Af Amer 73 (*) >90 mL/min  PRO B NATRIURETIC PEPTIDE     Status: Abnormal   Collection Time    08/13/12  7:20 AM      Result Value Range   Pro B Natriuretic peptide (BNP) 862.5 (*) 0 - 125 pg/mL  POCT I-STAT TROPONIN I     Status: None   Collection Time    08/13/12  7:26 AM      Result Value Range   Troponin i, poc 0.03  0.00 - 0.08 ng/mL   Comment 3           POCT I-STAT 3, BLOOD GAS (G3+)     Status: Abnormal   Collection Time    08/13/12  7:57 AM      Result Value Range   pH, Arterial 7.365  7.350 - 7.450   pCO2 arterial 40.9  35.0 - 45.0 mmHg   pO2, Arterial 124.0 (*) 80.0 - 100.0 mmHg   Bicarbonate 23.4  20.0 - 24.0 mEq/L   TCO2 25  0 - 100 mmol/L   O2 Saturation 99.0     Acid-base deficit 2.0  0.0 - 2.0 mmol/L   Collection site RADIAL, ALLEN'S TEST ACCEPTABLE     Drawn by Operator     Sample type ARTERIAL    OCCULT BLOOD, POC DEVICE     Status: Abnormal   Collection Time    08/13/12  8:04 AM      Result Value Range   Fecal Occult Bld POSITIVE (*) NEGATIVE  TYPE AND SCREEN     Status: None   Collection Time    08/13/12  8:15  AM      Result Value Range   ABO/RH(D) O POS     Antibody Screen NEG     Sample Expiration 08/16/2012     Unit Number [id number]     Blood Component Type RED CELLS,LR     Unit division 00     Status of Unit ISSUED     Transfusion Status OK TO TRANSFUSE     Crossmatch Result Compatible    PREPARE RBC (CROSSMATCH)     Status: None   Collection Time    08/13/12  10:15 AM      Result Value Range   Order Confirmation ORDER PROCESSED BY BLOOD BANK    MRSA PCR SCREENING     Status: Abnormal   Collection Time    08/13/12 11:19 AM      Result Value Range   MRSA by PCR POSITIVE (*) NEGATIVE  TROPONIN I     Status: None   Collection Time    08/13/12 11:30 AM      Result Value Range   Troponin I <0.30  <0.30 ng/mL     Dg Chest Port 1 View  08/13/2012   *RADIOLOGY REPORT*  Clinical Data: Chest pain and shortness of breath.  PORTABLE CHEST - 1 VIEW  Comparison: Chest 08/06/2012.  Findings: There is cardiomegaly and vascular congestion. Subsegmental atelectasis is seen in the lung bases.  No pneumothorax.  IMPRESSION: Cardiomegaly and pulmonary vascular congestion.   Original Report Authenticated By: Holley Dexter, M.D.    ROS:  As stated above in the HPI otherwise negative.  Blood pressure 159/81, pulse 55, temperature 97.7 F (36.5 C), temperature source Oral, resp. rate 20, height 5\' 5"  (1.651 m), weight 194 lb 7.1 oz (88.2 kg), SpO2 100.00%.    PE: Gen: NAD, Alert and Oriented HEENT:  [place]/AT, EOMI Neck: Supple, no LAD Lungs: CTA Bilaterally CV: RRR without M/G/R ABM: Soft, NTND, +BS Ext: No C/C/E  Assessment/Plan: 1) Melena. 2) Anemia. 3) History of GI bleed from proximal AVMs.   With his history of bleeding AVMs I will have him undergo a repeat EGD for further evaluation/treatment.  Plan: 1) EGD tomorrow.  Charles [name] D 08/13/2012, 4:55 PM

## 2012-08-13 NOTE — ED Provider Notes (Signed)
History     CSN: [id number]  Arrival date & time 08/13/12  4098   First MD Initiated Contact with Patient 08/13/12 [phone]      Chief Complaint  Patient presents with  . Chest Pain    (Consider location/radiation/quality/duration/timing/severity/associated sxs/prior treatment) Patient is a 67 y.o. male presenting with chest pain. The history is provided by the patient and medical records.  Chest Pain Pain location:  Substernal area Pain quality: aching   Pain radiates to:  Does not radiate Pain radiates to the back: no   Pain severity:  Severe Onset quality:  Sudden Duration:  2 hours Timing:  Constant Chronicity:  New Context: not breathing, no drug use and not eating   Relieved by:  None tried Worsened by:  Nothing tried Ineffective treatments:  Rest Associated symptoms: cough, fatigue and shortness of breath   Associated symptoms: no altered mental status, no diaphoresis, no fever and no nausea   Cough:    Cough characteristics:  Productive and hacking   Sputum characteristics:  Clear   Severity:  Moderate   Onset quality:  Gradual   Duration:  5 days   Timing:  Intermittent   Progression:  Unchanged   Chronicity:  Recurrent Shortness of breath:    Severity:  Moderate   Progression:  Unchanged Risk factors: coronary artery disease, hypertension, male sex and smoking   Risk factors: no prior DVT/PE     Past Medical History  Diagnosis Date  . Iron deficiency anemia   . Hypertension   . Chronic systolic heart failure     2D Echo (03/2010) - EF 35-40% with akinesis of inferoposterior myocardium  . CAD (coronary artery disease)     s/p 3-vessel CABG (12/2007) // 100% RCA stenosis with collaterals from left system. Severe bifurcation lesions of proxima CXA and OM. Moderate LAD diseaseFollowed by Dr. [facility]  . Hyperlipidemia LDL goal <70   . Obstructive sleep apnea     not on home CPAP  . Ischemic cardiomyopathy   . Chronic kidney disease (CKD), stage  III (moderate)     BL SCr 1.5-1.6  . Homelessness   . 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 // Colonoscopy and EGD in 03/2010 negative per report (record not found)  . 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 Polysubstance abuse     cocaine, THC  . AV malformation of gastrointestinal tract   . Family history of early CAD   . DDD (degenerative disc disease), lumbar   . Renal artery stenosis   . Peripheral vascular disease   . Seizure disorder   . Diabetes     Past Surgical History  Procedure Laterality Date  . Coronary artery bypass graft  12/2007  . Apc  03/2010    To treat small bowel AVMs    Family History  Problem Relation Age of Onset  . Heart disease Mother     unknown type  . Heart disease Father 13    died of MI at 12yo  . Heart disease Paternal Grandfather 54    died of MI  . Heart disease    . Heart disease Brother 30    History  Substance Use Topics  . Smoking status: Former Smoker -- 0.20 packs/day for 13 years    Types:  Cigarettes    Quit date: 03/20/1997  . Smokeless tobacco: Never Used  . Alcohol Use: Yes     Comment: occasionally drinks, a few times a month      Review of Systems  Constitutional: Positive for fatigue. Negative for fever and diaphoresis.  HENT: Negative for congestion, rhinorrhea and sinus pressure.   Respiratory: Positive for cough and shortness of breath.        Orthopnea  Cardiovascular: Positive for chest pain.  Gastrointestinal: Negative for nausea.  Psychiatric/Behavioral: Negative for altered mental status.  All other systems reviewed and are negative.    Allergies  Motrin and Tylenol  Home Medications   Current Outpatient Rx  Name  Route  Sig  Dispense  Refill  . amLODipine (NORVASC) 2.5 MG tablet   Oral   Take 2.5 mg by mouth daily.         [name]  aspirin 81 MG chewable tablet   Oral   Chew 81 mg by mouth daily.         . benzonatate (TESSALON) 200 MG capsule   Oral   Take 1 capsule (200 mg total) by mouth 3 (three) times daily as needed for cough.   20 capsule   0   . carvedilol (COREG) 12.5 MG tablet   Oral   Take 12.5 mg by mouth 2 (two) times daily with a meal.         . divalproex (DEPAKOTE ER) 500 MG 24 hr tablet   Oral   Take 500 mg by mouth 2 (two) times daily.         . ferrous sulfate 325 (65 FE) MG tablet   Oral   Take 325 mg by mouth 2 (two) times daily with a meal.         . furosemide (LASIX) 40 MG tablet   Oral   Take 40 mg by mouth daily.         . hydrALAZINE (APRESOLINE) 100 MG tablet   Oral   Take 100 mg by mouth every 8 (eight) hours.         [name] lisinopril (PRINIVIL,ZESTRIL) 20 MG tablet   Oral   Take 20 mg by mouth 2 (two) times daily.         . phenytoin (DILANTIN) 100 MG ER capsule   Oral   Take 200-300 mg by mouth 2 (two) times daily. Take 2 capsules by mouth in the morning and 3 capsules by mouth at bedtime.         . potassium chloride (K-DUR,KLOR-CON) 10 MEQ tablet   Oral   Take 10 mEq by mouth daily.         . simvastatin (ZOCOR) 40 MG tablet   Oral   Take 40 mg by mouth every evening.         . traMADol (ULTRAM) 50 MG tablet   Oral   Take 50 mg by mouth daily.           BP 145/87  Pulse 58  Temp(Src) 98.1 F (36.7 C) (Oral)  Resp 24  Ht 5\' 5"  (1.651 m)  Wt 190 lb (86.183 kg)  BMI 31.62 kg/m2  SpO2 100%  Physical Exam  Nursing note and vitals reviewed. Constitutional: He appears well-developed and well-nourished. No distress.  Pt is somnolent, will fall asleep in mid conversation  HENT:  Head: Normocephalic and atraumatic.  Eyes: EOM are normal.  Neck: JVD present. No tracheal deviation present.  Cardiovascular: Normal rate, regular  rhythm and intact distal pulses.   No extrasystoles are present.  No murmur heard. Pulmonary/Chest: Effort  normal. No stridor. No respiratory distress. He has wheezes. He has rales.  Abdominal: Soft. He exhibits no distension. There is no tenderness. There is no rebound and no guarding.  Neurological: He is alert. Coordination normal.  Skin: Skin is warm. No rash noted. He is not diaphoretic.    ED Course  Procedures (including critical care time)  Labs Reviewed  CBC - Abnormal; Notable for the following:    RBC 2.76 (*)    Hemoglobin 7.4 (*)    HCT 23.7 (*)    RDW 19.0 (*)    All other components within normal limits  BASIC METABOLIC PANEL  PRO B NATRIURETIC PEPTIDE  POCT I-STAT TROPONIN I   Dg Chest Port 1 View  08/13/2012   *RADIOLOGY REPORT*  Clinical Data: Chest pain and shortness of breath.  PORTABLE CHEST - 1 VIEW  Comparison: Chest 08/06/2012.  Findings: There is cardiomegaly and vascular congestion. Subsegmental atelectasis is seen in the lung bases.  No pneumothorax.  IMPRESSION: Cardiomegaly and pulmonary vascular congestion.   Original Report Authenticated By: Holley Dexter, M.D.     No diagnosis found.  O2 sat on [place] O2 is 100% and I interpret to be adequate  ECG at time 06:39 shows SR at rate 61, normal axis, non specific intraventricular conduction delay, non specific T wave abn's inf, laterally.  Abn ECG.  Borderline Q waves seen inferiorly on 08/06/12 seems slightly improved.     8:03 AM I informed pt of low Hgb.  He reports had lower GI bleed for a long time, never diagnosed at [facility], was found here at [facility] after pill study, was supposedly due to being on plavix.  He is on iron but reports stools have been darker recently.  Hemoccult positive here today.     9:00 AM Pt reports no change in CP after fentanyl.  Pt remains sleepy and is always asleep when I enter his room. MDM  Pt with h/o CAD, CHF now with CP, cough, SOB, orthopnea with exam consistent with CHF.  Pt's creatinine is stable, however Hgb is low at 7.4, 1 month ago was 9.5.  Will check hemoccult as well.   Serial troponins, BNP.  CXR suggests congestion and likely early CHF in my estimation.  Would benefit from diuresis.  If pt is indeed fluid overload, likely true Hgb is a little lower.  Would benefit from admission.          Gavin Pound. Oletta Lamas, MD 08/13/12 1610

## 2012-08-14 ENCOUNTER — Encounter (HOSPITAL_COMMUNITY): Payer: Self-pay

## 2012-08-14 ENCOUNTER — Inpatient Hospital Stay (HOSPITAL_COMMUNITY): Payer: Medicare Other

## 2012-08-14 ENCOUNTER — Other Ambulatory Visit: Payer: Self-pay

## 2012-08-14 ENCOUNTER — Encounter (HOSPITAL_COMMUNITY)
Admission: EM | Disposition: A | Discharge status: Home / Self Care | Payer: Self-pay | Source: Home / Self Care | Attending: Internal Medicine

## 2012-08-14 DIAGNOSIS — R001 Bradycardia, unspecified: Secondary | ICD-10-CM | POA: Diagnosis present

## 2012-08-14 DIAGNOSIS — K922 Gastrointestinal hemorrhage, unspecified: Secondary | ICD-10-CM

## 2012-08-14 HISTORY — PX: ESOPHAGOGASTRODUODENOSCOPY: SHX5428

## 2012-08-14 HISTORY — PX: HOT HEMOSTASIS: SHX5433

## 2012-08-14 LAB — COMPREHENSIVE METABOLIC PANEL
Alkaline Phosphatase: 72 U/L (ref 39–117)
BUN: 21 mg/dL (ref 6–23)
CO2: 23 mEq/L (ref 19–32)
Chloride: 105 mEq/L (ref 96–112)
Creatinine, Ser: 1.17 mg/dL (ref 0.50–1.35)
GFR calc non Af Amer: 62 mL/min — ABNORMAL LOW (ref >90)
Glucose, Bld: 108 mg/dL — ABNORMAL HIGH (ref 70–99)
Total Bilirubin: 0.2 mg/dL — ABNORMAL LOW (ref 0.3–1.2)

## 2012-08-14 LAB — COMPREHENSIVE METABOLIC PANEL WITH GFR
ALT: 18 U/L (ref 0–53)
AST: 39 U/L — ABNORMAL HIGH (ref 0–37)
Albumin: 3 g/dL — ABNORMAL LOW (ref 3.5–5.2)
Calcium: 8.5 mg/dL (ref 8.4–10.5)
GFR calc Af Amer: 72 mL/min — ABNORMAL LOW (ref >90)
Potassium: 4.4 meq/L (ref 3.5–5.1)
Sodium: 140 meq/L (ref 135–145)
Total Protein: 6.8 g/dL (ref 6.0–8.3)

## 2012-08-14 LAB — TYPE AND SCREEN
ABO/RH(D): O POS
Antibody Screen: NEGATIVE
Unit division: 0

## 2012-08-14 LAB — CBC
HCT: 27.6 % — ABNORMAL LOW (ref 39.0–52.0)
Hemoglobin: 8.8 g/dL — ABNORMAL LOW (ref 13.0–17.0)
MCH: 27.8 pg (ref 26.0–34.0)
MCHC: 31.9 g/dL (ref 30.0–36.0)
MCV: 87.3 fL (ref 78.0–100.0)
Platelets: 329 10*3/uL (ref 150–400)
RBC: 3.16 MIL/uL — ABNORMAL LOW (ref 4.22–5.81)
RDW: 19 % — ABNORMAL HIGH (ref 11.5–15.5)
WBC: 3.5 10*3/uL — ABNORMAL LOW (ref 4.0–10.5)

## 2012-08-14 LAB — PROTIME-INR: INR: 1.08 (ref 0.00–1.49)

## 2012-08-14 LAB — TROPONIN I
Troponin I: <0.3 ng/mL (ref <0.30)
Troponin I: <0.3 ng/mL (ref <0.30)
Troponin I: <0.3 ng/mL (ref <0.30)
Troponin I: <0.3 ng/mL (ref <0.30)

## 2012-08-14 SURGERY — EGD (ESOPHAGOGASTRODUODENOSCOPY)
Anesthesia: Moderate Sedation

## 2012-08-14 MED ORDER — NITROGLYCERIN 0.4 MG SL SUBL: 0.4000 mg | SUBLINGUAL_TABLET | SUBLINGUAL | Status: DC | PRN

## 2012-08-14 MED ORDER — MIDAZOLAM HCL 5 MG/ML IJ SOLN
INTRAMUSCULAR | Status: AC
  Filled 2012-08-14: qty 2

## 2012-08-14 MED ORDER — IPRATROPIUM BROMIDE 0.02 % IN SOLN
0.5000 mg | Freq: Four times a day (QID) | RESPIRATORY_TRACT | Status: DC | PRN
  Administered 2012-08-14: 0.5 mg via RESPIRATORY_TRACT
  Filled 2012-08-14: qty 2.5

## 2012-08-14 MED ORDER — HYDRALAZINE HCL 50 MG PO TABS
100.0000 mg | ORAL_TABLET | Freq: Three times a day (TID) | ORAL | Status: DC
  Administered 2012-08-15: 50 mg via ORAL
  Administered 2012-08-15 – 2012-08-16 (×4): 100 mg via ORAL
  Filled 2012-08-14 (×7): qty 2

## 2012-08-14 MED ORDER — FENTANYL CITRATE 0.05 MG/ML IJ SOLN
INTRAMUSCULAR | Status: AC
  Filled 2012-08-14: qty 2

## 2012-08-14 MED ORDER — MIDAZOLAM HCL 10 MG/2ML IJ SOLN
INTRAMUSCULAR | Status: DC | PRN
  Administered 2012-08-14 (×2): 2 mg via INTRAVENOUS

## 2012-08-14 MED ORDER — HYDRALAZINE HCL 50 MG PO TABS
100.0000 mg | ORAL_TABLET | ORAL | Status: AC
  Administered 2012-08-14: 100 mg via ORAL
  Filled 2012-08-14: qty 2

## 2012-08-14 MED ORDER — ALBUTEROL SULFATE (5 MG/ML) 0.5% IN NEBU
2.5000 mg | INHALATION_SOLUTION | Freq: Four times a day (QID) | RESPIRATORY_TRACT | Status: DC | PRN
  Administered 2012-08-14: 2.5 mg via RESPIRATORY_TRACT
  Filled 2012-08-14: qty 0.5

## 2012-08-14 MED ORDER — HYDRALAZINE HCL 100 MG PO TABS: 100.0000 mg | ORAL_TABLET | Freq: Three times a day (TID) | ORAL | Status: DC

## 2012-08-14 MED ORDER — DIPHENHYDRAMINE HCL 50 MG/ML IJ SOLN
INTRAMUSCULAR | Status: AC
  Filled 2012-08-14: qty 1

## 2012-08-14 MED ORDER — FENTANYL CITRATE 0.05 MG/ML IJ SOLN
INTRAMUSCULAR | Status: DC | PRN
  Administered 2012-08-14 (×2): 25 ug via INTRAVENOUS

## 2012-08-14 MED ORDER — SODIUM CHLORIDE 0.9 % IV SOLN
INTRAVENOUS | Status: DC
  Administered 2012-08-14: 15:00:00 via INTRAVENOUS

## 2012-08-14 MED ORDER — LIDOCAINE VISCOUS 2 % MT SOLN
OROMUCOSAL | Status: AC
  Filled 2012-08-14: qty 15

## 2012-08-14 MED ORDER — LIDOCAINE VISCOUS 2 % MT SOLN
OROMUCOSAL | Status: DC | PRN
  Administered 2012-08-14: 10 mL via OROMUCOSAL

## 2012-08-14 NOTE — Progress Notes (Signed)
Notified Dr. Zada Girt of pt level of care order. It needs to be updated to stepdown. No new orders received.

## 2012-08-14 NOTE — Progress Notes (Signed)
[facility]  Patient Status: Active (receiving services up to time of hospitalization)  [facility] is providing the following services: RN, PT, OT and MSW  If patient discharges after hours, please call [phone].   Charles [name] 08/14/2012, 9:51 AM

## 2012-08-14 NOTE — H&P (Signed)
Internal Medicine Attending Admission Note Date: 08/14/2012  Charles [name] name: Charles [name] Medical record number: [id number] Date of birth: May 21, 1945 Age: 67 y.o. Gender: male  I saw and evaluated the Charles [name]. I reviewed the resident's note and I agree with the resident's findings and plan as documented in the resident's note.  Chief Complaint(s): Chest pain and shortness of breath  History - key components related to admission: Charles [name] is a 66 year old man with past medical history most significant for coronary artery disease status post CABG in 19, chronic systolic congestive heart failure with ejection fraction 35-40%, AV malformation in the stomach who comes in with chief complaints of chest pain which started on the morning of admission. Pain was described as sharp, 10 out of 10 intensity, located in the left lower chest and associated with shortness of breath and nausea. There were no exacerbating or relieving factors noted. Pain score better by itself to about 6/10 at the time of admission. Review of system positive for lower extremity edema, dizziness. The Charles [name] has also noticed black stools since last 2 weeks. He is unable to quantify the amount of blood. He has this black stools off and on. His BM this morning was clear brown without any blood or darkness. Charles [name] denies any hematemesis, hemoptysis, vomiting, nausea, palpitations.  16 point review of system was performed and pertinent positives and negatives have been noted above. All other review of systems was negative except what is noted above.   Past medical history, past surgical history, medications, social history and family history was reviewed and is as per resident's note.   Physical Exam - key components related to admission:  Filed Vitals:   08/14/12 0425 08/14/12 0619 08/14/12 0734 08/14/12 0832  BP:   188/77 190/93  Pulse: 51  61   Temp:   98.5 F (36.9 C)   TempSrc:   Oral   Resp:   21   Height:      Weight:       SpO2: 100% 100% 100%   Physical Exam: General: Vital signs reviewed and noted. Well-developed, well-nourished, in no acute distress; alert, appropriate and cooperative throughout examination.  Head: Normocephalic, atraumatic.  Eyes: PERRL, EOMI, No signs of anemia or jaundince.  Nose: Mucous membranes moist, not inflammed, nonerythematous.  Throat: Oropharynx nonerythematous, no exudate appreciated.   Neck: No deformities, masses, or tenderness noted.Supple, No carotid Bruits, JVD +  Lungs:  Normal respiratory effort. Clear to auscultation BL without crackles or wheezes.  Heart: RRR. S1 and S2 normal without gallop, murmur, or rubs. Chest pain in the mid sternal region is reproducible on palpation  Abdomen:  BS normoactive. Soft, Nondistended, non-tender.  No masses or organomegaly.  Extremities:  trace edema bilaterally   Neurologic: A&O X3, CN II - XII are grossly intact. Motor strength is 5/5 in the all 4 extremities, Sensations intact to light touch, Cerebellar signs negative.  Skin: No visible rashes, scars.    Lab results:  Basic Metabolic Panel:  Recent Labs  91/47/82 0719 08/14/12 0425  NA 138 140  K 4.1 4.4  CL 107 105  CO2 21 23  GLUCOSE 97 108*  BUN 21 21  CREATININE 1.16 1.17  CALCIUM 8.2* 8.5   Liver Function Tests:  Recent Labs  08/14/12 0425  AST 39*  ALT 18  ALKPHOS 72  BILITOT 0.2*  PROT 6.8  ALBUMIN 3.0*   CBC:  Recent Labs  08/13/12 2330 08/14/12 0425  WBC 4.2 3.5*  HGB 8.5* 8.8*  HCT 27.0* 27.6*  MCV 85.4 87.3  PLT 304 329   Cardiac Enzymes:  Recent Labs  08/13/12 1709 08/13/12 2330 08/14/12 0515  TROPONINI <0.30 <0.30 <0.30   Imaging results:  Dg Chest Port 1 View  08/13/2012   *RADIOLOGY REPORT*  Clinical Data: Chest pain and shortness of breath.  PORTABLE CHEST - 1 VIEW  Comparison: Chest 08/06/2012.  Findings: There is cardiomegaly and vascular congestion. Subsegmental atelectasis is seen in the lung bases.  No  pneumothorax.  IMPRESSION: Cardiomegaly and pulmonary vascular congestion.   Original Report Authenticated By: Holley Dexter, M.D.    Other results: EKG: 61 beats per minute, normal axis, nonspecific ST and T wave changes noted, EKG unchanged from previous tracing done on May 20th 2014  Assessment & Plan by Problem:  Principal Problem:   Chest pain, midsternal Active Problems:   Iron deficiency anemia   CAD (coronary artery disease)   Ischemic cardiomyopathy   Chronic kidney disease (CKD), stage III (moderate)   Seizure disorder   Acute on chronic systolic CHF (congestive heart failure)   Bradycardia   Charles [name] is 67 year old man with past medical history as noted above who comes in with chief complaints of chest pain. The differential for chest pain include ACS versus musculoskeletal(reproducible on palpation) versus gastrointestinal(reflux). Troponins have been negative since admission and also EKG has been unchanged as compared to previous admissions which makes ACS unlikely. Charles [name]'s shortness of breath is likely from exacerbation of chronic systolic congestive heart failure further exacerbated by chronic blood loss anemia. Charles [name] has elevated blood pressure which along with anemia may have been the causes for his exacerbation. Charles [name]'s chest pain is most likely related to reflux disease. With black stools, it is more than likely that Charles [name] has had exacerbation of his reflux and AVMs(diagnosed in the past). Charles [name] was also found to be anemic on admission. Charles [name] was transfused one unit yesterday. Based on the above discussion the plan of care includes -Continue on proton pump inhibitors twice a day -await EGD results(appreciate gastroenterology for their input) -Continue to monitor hemoglobin every day and transfuse if hemoglobin is less than 7 -Consider obtaining an anemia panel to treat the cause of anemia including putting him on iron replacement therapy -Continue home doses  of Lasix (may need higher home dose at discharge) -Controlled blood pressure aggressively and restart home medications -transfer to telemetry bed today after procedure -Continue to monitor I's and O's and daily weights  Rest of the medical management as per resident's note.  Lars Mage MD Faculty-Internal Medicine Residency Program

## 2012-08-14 NOTE — Progress Notes (Signed)
Pt transported to radiology by tech and will be transported directly to endo post x-ray (per [facility] in Endo).  Pt was alert and oriented prior to transport, transported on 2 L [place].

## 2012-08-14 NOTE — Progress Notes (Signed)
Subjective: Worsening SOB overnight, improved this morning. Pt wants to eat, but understands he must remain NPO for EGD today at 2pm.  Objective: Vital signs in last 24 hours: Filed Vitals:   08/14/12 0920 08/14/12 0925 08/14/12 1100 08/14/12 1315  BP: 188/103 169/79 149/78 163/93  Pulse: 58 60 54 59  Temp:   97.5 F (36.4 C)   TempSrc:   Oral   Resp: 19 21 26 13   Height:      Weight:      SpO2:   100% 96%   Weight change: 4 lb 7.1 oz (2.017 kg)  Intake/Output Summary (Last 24 hours) at 08/14/12 1349 Last data filed at 08/14/12 1200  Gross per 24 hour  Intake    200 ml  Output   1050 ml  Net   -850 ml   Vitals reviewed. General: Standing up by the sink shaving, NAD HEENT: PERRL, EOMI, no scleral icterus Cardiac: RRR, no rubs, murmurs or gallops Pulm: clear to auscultation bilaterally, no wheezes, rales, or rhonchi Abd: soft, nontender, nondistended, BS present Ext: warm and well perfused, no pedal edema Neuro: alert and oriented X3, cranial nerves II-XII grossly intact, strength and sensation to light touch equal in bilateral upper and lower extremities  Lab Results: Basic Metabolic Panel:  Recent Labs Lab 08/13/12 0719 08/14/12 0425  NA 138 140  K 4.1 4.4  CL 107 105  CO2 21 23  GLUCOSE 97 108*  BUN 21 21  CREATININE 1.16 1.17  CALCIUM 8.2* 8.5   Liver Function Tests:  Recent Labs Lab 08/14/12 0425  AST 39*  ALT 18  ALKPHOS 72  BILITOT 0.2*  PROT 6.8  ALBUMIN 3.0*   CBC:  Recent Labs Lab 08/13/12 2330 08/14/12 0425  WBC 4.2 3.5*  HGB 8.5* 8.8*  HCT 27.0* 27.6*  MCV 85.4 87.3  PLT 304 329   Cardiac Enzymes:  Recent Labs Lab 08/13/12 2330 08/14/12 0515 08/14/12 1108  TROPONINI <0.30 <0.30 <0.30   BNP:  Recent Labs Lab 08/13/12 0720  PROBNP 862.5*   Urine Drug Screen: Drugs of Abuse     Component Value Date/Time   LABOPIA POSITIVE* 06/14/2012 1112   COCAINSCRNUR NONE DETECTED 06/14/2012 1112   LABBENZ NONE DETECTED 06/14/2012  1112   AMPHETMU NONE DETECTED 06/14/2012 1112   THCU NONE DETECTED 06/14/2012 1112   LABBARB NONE DETECTED 06/14/2012 1112     Micro Results: Recent Results (from the past 240 hour(s))  MRSA PCR SCREENING     Status: Abnormal   Collection Time    08/13/12 11:19 AM      Result Value Range Status   MRSA by PCR POSITIVE (*) NEGATIVE Final   Comment:            The GeneXpert MRSA Assay (FDA     approved for NASAL specimens     only), is one component of a     comprehensive MRSA colonization     surveillance program. It is not     intended to diagnose MRSA     infection nor to guide or     monitor treatment for     MRSA infections.     RESULT CALLED TO, READ BACK BY AND VERIFIED WITH:     L. MILLER RN 13:10 08/13/12 ([email or web address])   Studies/Results: Dg Chest Port 1 View  08/13/2012   *RADIOLOGY REPORT*  Clinical Data: Chest pain and shortness of breath.  PORTABLE CHEST - 1 VIEW  Comparison: Chest 08/06/2012.  Findings:  There is cardiomegaly and vascular congestion. Subsegmental atelectasis is seen in the lung bases.  No pneumothorax.  IMPRESSION: Cardiomegaly and pulmonary vascular congestion.   Original Report Authenticated By: Holley Dexter, M.D.   Medications: I have reviewed the patient's current medications. Scheduled Meds: . amLODipine  2.5 mg Oral Daily  . atorvastatin  20 mg Oral q1800  . carvedilol  3.125 mg Oral BID WC  . Chlorhexidine Gluconate Cloth  6 each Topical Q0600  . divalproex  500 mg Oral BID  . furosemide  40 mg Oral Daily  . mupirocin ointment  1 application Nasal BID  . pantoprazole (PROTONIX) IV  40 mg Intravenous Q12H  . phenytoin  200 mg Oral Daily  . phenytoin  300 mg Oral QHS   Continuous Infusions: . sodium chloride     PRN Meds:.albuterol, alum & mag hydroxide-simeth, ipratropium, nitroGLYCERIN, ondansetron (ZOFRAN) IV, ondansetron Assessment/Plan:  #Chest pain: Patient woke up on the morning of admission with substernal chest pain. Differentials  for his chest pain including demand ischemia from anemia due to GI bleed vs ACS given his h/o CAD s/p CABG vs atrial stretching from CHF exaceberation. His EKG was unchanged compared to previous tracings without new ST- T changes. Troponins have been negative. CXR with vascular congestion, so chest pain is likely related to CHF exacerbation from his anemia. He was given 20 IV lasix x1, and then started on his home regime on 40 po daily. GI was consulted for his anemia for possible intervention. - Protonix  - GI recs, EGD today   #Acute on chronic systolic CHF exaceberation: Likely 2/2 poorly controlled HTN in combination with his anemia. Echo from 2012 shows EF of 35- 40%. His exam is consistent with volume overload with pedal edema and JVD. CXR shows cardiomegaly and pulmonary vascular congestion. S/p 20 mg IV lasix  X1, down 1.5L today. - Would repeat 1 more dose of lasix ( 40 mg IV, if BP tolerates) after the blood transfusion  - Strict I and O's  - daily weights   #Acute on chronic anemia: His baseline Hb~ 9.5 . He presents with Hb of 7.4 with the h/o black stools for 4 days and FOBT positive in the ED. He has a history of GI bleed in 2012 that was thought to be likely 2/2 proximal intestine AVM. He also had EGD which showed a hiatal hernia and a colonoscopy showing hemorrhoids. Due to his Hgb, he was transfused 1u pRBCs and was started on IV Protonix. GI was consulted and is to perform an EGD today. - EGD with GI, appreciate recommendations - AM CBC  - Protonix IV 40 mg BID  - NPO - Holding ASA   # Fall: Patient reports falling this AM. It is unclear if he lost conscious. It could be related to dizziness from his worsening anemia. Not orthostatic on admission. Stable on feet today.  #Seizure disorder: Stable.  Continuing home meds- dilantin and depakote.   # CAD s/p CABG: Holding ASA and lisinopril with his concomitant GI bleed. Continuing Coreg but at a very low dose with holding  parameters (as he has been bradycardic and to hold for SBP < 90 mm Hg).   #HTN: BP have been labile. Hold BP meds ( hydralazine , lisinopril ) in setting of active GI bleed. Will likely restart after EGD pending BP.  #CKD stage III: Baseline Cr is 1.5- 1.6. He presented with Cr of 1.16. 1.17 today. This can be monitored as an out patient.  #  DVT:  SCD's    Dispo: Disposition is deferred at this time, awaiting improvement of current medical problems.  Anticipated discharge in approximately 1-2 day(s).   The patient does have a current PCP (MITCHELL, JOHN), therefore will not be requiring OPC follow-up after discharge.   The patient does not have transportation limitations that hinder transportation to clinic appointments.  .Services Needed at time of discharge: Y = Yes, Blank = No PT:   OT:   RN:   Equipment:   Other:     LOS: 1 day   Genelle Gather 08/14/2012, 1:49 PM

## 2012-08-14 NOTE — Progress Notes (Addendum)
  1625:  Nurse contacted MD to inform that BP remained elevated during procedures in addition to latest reading post transport from Endo.  MD acknowledged nurse concerns and wrote orders for Hydralazine.  Nurse will administer as ordered.  1200:  Nurse communicated with MD at bedside; BP remains elevated.  No interventions were ordered at this time.  1610:  Nurse communicated with MD at bedside recent BP post lasix and Norvasc administration.  MD acknowledged that pt home med list included additional BP medications not yet administered at facility, MD will review and write orders accordingly.  Nurse will continue to monitor.   9604:  Nurse contacted MD to inform pt BP >180 (188/70) for two consecutive readings, currently there are no PRN medications for BP.  Per MD; nurse should administer scheduled 1000 BP medication now.  Nurse will administer as ordered and will continue to monitor.

## 2012-08-14 NOTE — Progress Notes (Signed)
Patient c/o SOB, sats 88 RA commenced on oxygen at 2l sats 100% will continue to monitor

## 2012-08-14 NOTE — Op Note (Signed)
[facility] [address] [place], [address]   OPERATIVE PROCEDURE REPORT  PATIENT :Charles [name], Charles [name]  MR#: [id number] BIRTHDATE :01/16/1946 GENDER: Male ENDOSCOPIST: Dr.  Lorenza Burton, MD ASSISTANT:   Felecia Shelling, RN Percival Spanish, Technician PROCEDURE DATE: 2012/09/12 PRE-PROCEDURE PREPERATION: Patient fasted for 4 hours prior to procedure. PRE-PROCEDURE PHYSICAL: Patient has stable vital signs.  Neck is supple.  There is no JVD, thyromegaly or LAD.  Chest clear to auscultation.  S1 and S2 regular.  Abdomen soft, non-distended, non-tender with NABS. PROCEDURE:     EGD w/ control of bleeding ASA CLASS:     Class IV INDICATIONS:     Chest pain, melena,  and acute post hemorrhagic anemia. MEDICATIONS:     Fentanyl 50 mcg IV and Versed 4 mg IV TOPICAL ANESTHETIC:   Viscous Xylocaine-15 cc PO.  DESCRIPTION OF PROCEDURE:   After the risks benefits and alternatives of the procedure were thoroughly explained, informed consent was obtained.  The PENTAX GASTOROSCOPE [id number]  was introduced through the mouth and advanced to the    , without limitations. The instrument was slowly withdrawn as the mucosa was fully examined.   The esophagus, GEJ and the proximal small bowel appeared normal. There were no ulcers, erosions, masses or polyps noted. Retroflexed views revealed a small hiatal hernia. A small patch of mucosa with spotty bleeding was noted not typical of an AVM but these areas bled easily on contact with the tip of the scope and these were ablated with an APC x 10.. The scope was then withdrawn from the patient and the procedure terminated. The patient tolerated the procedure without immediate complications.  IMPRESSION:  Small patch of mucosa with spotty bleeding in the midbody [not typical of AVM]-ablated with an APC; otherwise normal esophagogastroduodenoscopy.  RECOMMENDATIONS:     1.  Anti-reflux regimen to be followed. 2.  Avoid NSAIDS for  now. 3. Hold Aspirin for now. 4. Clear liquid diet. 5. Check PT/INR.  REPEAT EXAM:  not planned for now.  DISCHARGE INSTRUCTIONS: Standard instructions given. _______________________________ eSigned:  Dr. Lorenza Burton, MD 2012-09-12 3:54 PM   CPT CODES:     [phone], EGD with Control Hemorrhage  DIAGNOSIS CODES:     786.50 Chest pain, 578.1 melena, 280.9 anemia s/p hemorrhage   CC: Dr. Lars Mage  PATIENT NAME:  Charles [name], Charles [name] MR#: [id number]

## 2012-08-15 LAB — BASIC METABOLIC PANEL
BUN: 15 mg/dL (ref 6–23)
CO2: 25 mEq/L (ref 19–32)
Chloride: 102 mEq/L (ref 96–112)
GFR calc non Af Amer: 64 mL/min — ABNORMAL LOW (ref >90)
Glucose, Bld: 93 mg/dL (ref 70–99)
Potassium: 4.1 mEq/L (ref 3.5–5.1)

## 2012-08-15 LAB — CBC
HCT: 28.7 % — ABNORMAL LOW (ref 39.0–52.0)
Hemoglobin: 9.1 g/dL — ABNORMAL LOW (ref 13.0–17.0)
MCHC: 31.7 g/dL (ref 30.0–36.0)
RBC: 3.35 MIL/uL — ABNORMAL LOW (ref 4.22–5.81)

## 2012-08-15 MED ORDER — LISINOPRIL 20 MG PO TABS
20.0000 mg | ORAL_TABLET | Freq: Two times a day (BID) | ORAL | Status: DC
  Administered 2012-08-15 – 2012-08-16 (×3): 20 mg via ORAL
  Filled 2012-08-15 (×6): qty 1

## 2012-08-15 NOTE — Progress Notes (Signed)
Internal Medicine Teaching Service Attending Note Date: 08/15/2012  Patient name: Charles [name]  Medical record number: [id number]  Date of birth: 09-10-1945    This patient has been seen and discussed with the house staff. Please see their note for complete details. I concur with their findings with the following additions/corrections:  Mr. Shanahan was doing much better this AM and requesting to go home.  It is best for him to stay the evening and make sure his H/H have stabilized after a presumed GIB due to AVMs.  If stable in the AM, he can likely go home.  His other symptoms of chest pain and difficulty breathing have resolved.    MULLEN, EMILY 08/15/2012, 3:22 PM

## 2012-08-15 NOTE — Clinical Documentation Improvement (Signed)
Anemia Blood Loss Clarification   CLINICAL DOCUMENTATION QUERIES ARE NOT PART OF THE PERMANENT MEDICAL RECORD          08/15/12  Dr. Sherrine Maples,   In an effort to better capture your patient's severity of illness, reflect appropriate length of stay and utilization of resources, a review of the patient medical record has revealed the following information:    - "Acute on chronic anemia: His baseline Hb~ 9.5 . He presents with Hb of 7.4 with the h/o black stools for 4 days and FOBT positive in the ED. He has a history of GI bleed in 2012 that was thought to be likely 2/2 proximal intestine AVM. He also had EGD which showed a hiatal hernia and a colonoscopy showing hemorrhoids. Due to his Hgb, he was transfused 1u pRBCs and was started on IV Protonix. GI performed an EGD on 5/28 which revealed a small mucosal bleed that was cauterized. His Hgb is 9.1 today, improved from yesterday, and he denies any further black stools. GI wants to see how he does todaya dn d/c home tomorrow.  - F/u with GI, appreciate recommendations  - AM CBC  - Protonix IV 40 mg BID  - Regular diet, per GI  - Holding ASA, will restart prior to d/c" Progress Notes signed by Genelle Gather, MD at 08/15/2012 11:55 AM      Based on your clinical judgment, please clarify and document the TYPE of Acute on Chronic Anemia in the progress notes and discharge summary:    - Acute Blood Loss Anemia on Chronic Anemia   - Acute Hemorrhagic Anemia on Chronic Anemia   - Other Type of Anemia   - Unable to Clinically Determine    In responding to this query please exercise your independent judgment.      The fact that a query is asked, does not imply that any particular answer is desired or expected.      Reviewed: additional documentation in the medical record  Thank You,  Jerral Ralph  RN BSN CCDS Certified Clinical Documentation Specialist: Cell   [phone]  Health Information Management [facility]   RESPOND TO THE THIS QUERY, FOLLOW THE INSTRUCTIONS BELOW:  1. If needed, update documentation for the patient's encounter via the notes activity.  2. Access this query again and click edit on the In [employer].  3. After updating, or not, click F2 to complete all highlighted (required) fields concerning your review. Select "additional documentation in the medical record" OR "no additional documentation provided".  4. Click Sign note button.  5. The deficiency will fall out of your In Basket *Please let us know if you are not able to complete this workflow by phone or e-mail (listed below).

## 2012-08-15 NOTE — Progress Notes (Signed)
Called report to Joycie Peek, RN, transferring patient to 4700 room 4739, VSS, no acute distress.

## 2012-08-15 NOTE — Progress Notes (Signed)
Patient transferred to 4739.  Placed on contact Isolation.  Respiratory exchange even and unlabored.  IV site unremarkable.  Alert and oriented.

## 2012-08-15 NOTE — Progress Notes (Signed)
Subjective: EGD revealed small mucosal bleed. Pt Tolerating clears. Denies any further chest pain or SOB. He states that he wants to go home.  Objective: Vital signs in last 24 hours: Filed Vitals:   08/14/12 1951 08/15/12 0155 08/15/12 0359 08/15/12 0839  BP: 157/64 157/72 167/76 174/100  Pulse: 69  65 64  Temp: 97.9 F (36.6 C)  98.2 F (36.8 C)   TempSrc: Oral  Oral   Resp: 24  9   Height:      Weight:  189 lb 2.5 oz (85.8 kg)    SpO2: 96%  99%    Weight change: -5 lb 4.7 oz (-2.4 kg)  Intake/Output Summary (Last 24 hours) at 08/15/12 0846 Last data filed at 08/15/12 0600  Gross per 24 hour  Intake    120 ml  Output   2050 ml  Net  -1930 ml   Vitals reviewed. General: Comfortably lying in bed, NAD HEENT: PERRL, EOMI, no scleral icterus Cardiac: RRR, no rubs, murmurs or gallops Pulm: Clear to auscultation bilaterally, no wheezes, rales, or rhonchi Abd: Soft, nontender, nondistended Ext: Warm and well perfused, no pedal edema Neuro: Alert and oriented X3, cranial nerves II-XII grossly intact, strength and sensation to light touch equal in bilateral upper and lower extremities  Lab Results: Basic Metabolic Panel:  Recent Labs Lab 08/14/12 0425 08/15/12 0420  NA 140 138  K 4.4 4.1  CL 105 102  CO2 23 25  GLUCOSE 108* 93  BUN 21 15  CREATININE 1.17 1.14  CALCIUM 8.5 8.9   Liver Function Tests:  Recent Labs Lab 08/14/12 0425  AST 39*  ALT 18  ALKPHOS 72  BILITOT 0.2*  PROT 6.8  ALBUMIN 3.0*   CBC:  Recent Labs Lab 08/14/12 0425 08/15/12 0420  WBC 3.5* 4.5  HGB 8.8* 9.1*  HCT 27.6* 28.7*  MCV 87.3 85.7  PLT 329 323   Cardiac Enzymes:  Recent Labs Lab 08/14/12 0515 08/14/12 1108 08/14/12 1744  TROPONINI <0.30 <0.30 <0.30   BNP:  Recent Labs Lab 08/13/12 0720  PROBNP 862.5*   Urine Drug Screen: Drugs of Abuse     Component Value Date/Time   LABOPIA POSITIVE* 06/14/2012 1112   COCAINSCRNUR NONE DETECTED 06/14/2012 1112   LABBENZ  NONE DETECTED 06/14/2012 1112   AMPHETMU NONE DETECTED 06/14/2012 1112   THCU NONE DETECTED 06/14/2012 1112   LABBARB NONE DETECTED 06/14/2012 1112     Micro Results: Recent Results (from the past 240 hour(s))  MRSA PCR SCREENING     Status: Abnormal   Collection Time    08/13/12 11:19 AM      Result Value Range Status   MRSA by PCR POSITIVE (*) NEGATIVE Final   Comment:            The GeneXpert MRSA Assay (FDA     approved for NASAL specimens     only), is one component of a     comprehensive MRSA colonization     surveillance program. It is not     intended to diagnose MRSA     infection nor to guide or     monitor treatment for     MRSA infections.     RESULT CALLED TO, READ BACK BY AND VERIFIED WITH:     L. MILLER RN 13:10 08/13/12 ([email or web address])   Studies/Results: Dg Chest 2 View  08/14/2012   *RADIOLOGY REPORT*  Clinical Data: Pre endoscopy.  Shortness of breath and cough.  CHEST - 2 VIEW  Comparison: 08/13/2012.  Findings: Trachea is midline.  Heart is enlarged, stable.  Mild central pulmonary vascular congestion.  Tiny bilateral pleural effusions.  IMPRESSION: Central pulmonary vascular congestion with tiny bilateral pleural effusions.   Original Report Authenticated By: Leanna Battles, M.D.   Medications: I have reviewed the patient's current medications. Scheduled Meds: . amLODipine  2.5 mg Oral Daily  . atorvastatin  20 mg Oral q1800  . carvedilol  3.125 mg Oral BID WC  . Chlorhexidine Gluconate Cloth  6 each Topical Q0600  . divalproex  500 mg Oral BID  . furosemide  40 mg Oral Daily  . hydrALAZINE  100 mg Oral Q8H  . lisinopril  20 mg Oral BID  . mupirocin ointment  1 application Nasal BID  . pantoprazole (PROTONIX) IV  40 mg Intravenous Q12H  . phenytoin  200 mg Oral Daily  . phenytoin  300 mg Oral QHS   Continuous Infusions:   PRN Meds:.albuterol, alum & mag hydroxide-simeth, ipratropium, nitroGLYCERIN, ondansetron (ZOFRAN) IV,  ondansetron Assessment/Plan:  #Acute on chronic anemia: His baseline Hb~ 9.5 . He presents with Hb of 7.4 with the h/o black stools for 4 days and FOBT positive in the ED. He has a history of GI bleed in 2012 that was thought to be likely 2/2 proximal intestine AVM. He also had EGD which showed a hiatal hernia and a colonoscopy showing hemorrhoids. Due to his Hgb, he was transfused 1u pRBCs and was started on IV Protonix. GI performed an EGD on 5/28 which revealed a small mucosal bleed that was cauterized. His Hgb is 9.1 today, improved from yesterday, and he denies any further black stools. GI wants to see how he does todaya dn d/c home tomorrow. - F/u with GI, appreciate recommendations - AM CBC  - Protonix IV 40 mg BID  - Regular diet, per GI - Holding ASA, will restart prior to d/c  #Chest pain: Resolved. Patient woke up on the morning of admission with substernal chest pain. Differentials for his chest pain including demand ischemia from anemia due to GI bleed vs ACS given his h/o CAD s/p CABG vs atrial stretching from CHF exaceberation. His EKG was unchanged compared to previous tracings without new ST- T changes. Troponins have been negative. CXR with vascular congestion, so chest pain is likely related to CHF exacerbation vs anemia. He was given 20 IV lasix x1, and then started on his home regime on 40 po daily. GI was consulted. - Protonix  - GI recs  #Acute on chronic systolic CHF exaceberation: proBNP 862.5 on admission. Likely 2/2 poorly controlled HTN in combination with his anemia. Echo from 2012 shows EF of 35- 40%. His exam is consistent with volume overload with pedal edema and JVD. CXR shows cardiomegaly and pulmonary vascular congestion. S/p 20 mg IV lasix  X1, down 3L today. - Home Lasix 40mg  po daily - Strict I and O's  - daily weights   # Fall: Patient reports falling this AM. It is unclear if he lost conscious. It could be related to dizziness from his worsening anemia. Not  orthostatic on admission. Stable on feet today.  #Seizure disorder: Stable.  Continuing home meds- dilantin and depakote.   # CAD s/p CABG: Held ASA and lisinopril with his concomitant GI bleed. Continued Coreg but at a very low dose with holding parameters (as he has been bradycardic, hold for SBP < 90 mm Hg). His Lisinopril was restarted today, but keeping Coreg at current dose 2/2 bradycardia. Will  likely increase Coreg does tomorrow.   #HTN: BP have been labile. Continued his Amlodipine on admission. Held hydralazine and lisinopril and decreased the Coreg dose in setting of active GI bleed. Hydralazine was restarted 5/28 and the Lisinopril 5/29. Will likely increase the Coreg tomorrow.  - Amlodipine 2.5mg  po daily - Lasix 40mg  po daily - Lisinopril 20mg  po BID - Hydralazine 100mg  po TID - Coreg 3.125mg  po daily  #CKD stage III: Baseline Cr is 1.5- 1.6. He presented with Cr of 1.16. 1.14 today. This will be monitored as an out patient.  #DVT:  SCD's    Dispo: Disposition is deferred at this time, awaiting improvement of current medical problems.  Anticipated discharge in approximately 1-2 day(s).   The patient does have a current PCP (MITCHELL, JOHN), therefore will not be requiring OPC follow-up after discharge.   The patient does not have transportation limitations that hinder transportation to clinic appointments.  .Services Needed at time of discharge: Y = Yes, Blank = No PT:   OT:   RN:   Equipment:   Other:     LOS: 2 days   Genelle Gather 08/15/2012, 8:46 AM

## 2012-08-15 NOTE — Evaluation (Signed)
Clinical/Bedside Swallow Evaluation Patient Details  Name: Charles [name] MRN: [id number] Date of Birth: [birth year]  Today's Date: 08/15/2012 Time: 1010-1023 SLP Time Calculation (min): 13 min  Past Medical History:  Past Medical History  Diagnosis Date  . Iron deficiency anemia   . Hypertension   . Chronic systolic heart failure     2D Echo (03/2010) - EF 35-40% with akinesis of inferoposterior myocardium  . CAD (coronary artery disease)     s/p 3-vessel CABG (12/2007) // 100% RCA stenosis with collaterals from left system. Severe bifurcation lesions of proxima CXA and OM. Moderate LAD diseaseFollowed by Dr. Rhona Leavens in [facility]  . Hyperlipidemia LDL goal <70   . Obstructive sleep apnea     not on home CPAP  . Ischemic cardiomyopathy   . Chronic kidney disease (CKD), stage III (moderate)     BL SCr 1.5-1.6  . Homelessness   . 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 // Colonoscopy and EGD in 03/2010 negative per report (record not found)  . 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 Polysubstance abuse     cocaine, THC  . AV malformation of gastrointestinal tract   . Family history of early CAD   . DDD (degenerative disc disease), lumbar   . Renal artery stenosis   . Peripheral vascular disease   . Seizure disorder   . Diabetes   . Acute on chronic systolic CHF (congestive heart failure) 08/13/2012  . Myocardial infarction   . Seizures    Past Surgical History:  Past Surgical History  Procedure Laterality Date  . Coronary artery bypass graft  12/2007  . Apc  03/2010    To treat small bowel AVMs  . Esophagogastroduodenoscopy N/A 08/14/2012    Procedure: ESOPHAGOGASTRODUODENOSCOPY (EGD);  Surgeon: Charna Elizabeth, MD;  [facility] END[facility]COPY;  Service: Endoscopy;  Laterality: N/A;  . Hot hemostasis N/A 08/14/2012   Procedure: HOT HEMOSTASIS (ARGON PLASMA COAGULATION/BICAP);  Surgeon: Charna Elizabeth, MD;  Location: [facility] ENDOSCOPY;  Service: Endoscopy;  Laterality: N/A;   HPI:  67 y.o. 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  Dx include acute on chronic systolic CHF exaceberation, anemia.  EGD yesterday: small patch of mucosa with spotty bleeding in the midbody [not typical of AVM]-ablated with an APC; otherwise normal EGD.  Pt reports no prior difficulties swallowing.    Assessment / Plan / Recommendation Clinical Impression  Pt presents with normal oropharyngeal swallow with no signs or described symptoms suggestive of dysphagia.  Rec continuing regular diet, thin liquids.  No slp f/u.             Diet Recommendation Regular;Thin liquid   Medication Administration: Whole meds with liquid Supervision: Patient able to self feed       Follow Up Recommendations  None     Prior Functional Status       General Date of Onset: 08/13/12 HPI: 67 y.o. 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  Dx include acute on chronic systolic CHF exaceberation, anemia.  EGD yesterday: small patch of mucosa with spotty bleeding in the Type of Study: Bedside swallow evaluation Diet  Prior to this Study: Regular;Thin liquids Temperature Spikes Noted: No Respiratory Status: Room air History of Recent Intubation: No Behavior/Cognition: Alert;Cooperative;Pleasant mood Oral Cavity - Dentition: Adequate natural dentition Self-Feeding Abilities: Able to feed self Patient Positioning: Upright in bed Baseline Vocal Quality: Clear Volitional Cough: Strong Volitional Swallow: Able to elicit    Oral/Motor/Sensory Function Overall Oral Motor/Sensory Function: Appears within functional limits for tasks assessed   Ice Chips Ice chips: Within  functional limits Presentation: Self Fed   Thin Liquid Thin Liquid: Within functional limits Presentation: Cup;Self Fed    Nectar Thick Nectar Thick Liquid: Not tested   Honey Thick Honey Thick Liquid: Not tested   Puree Puree: Within functional limits Presentation: Self Fed   Solid   GO    Solid: Within functional limits Presentation: Self Fed       Blenda Mounts Laurice 08/15/2012,10:27 AM

## 2012-08-15 NOTE — Progress Notes (Signed)
Subjective: No acute events.  Feels well.  Objective: Vital signs in last 24 hours: Temp:  [97.4 F (36.3 C)-98.9 F (37.2 C)] 98.2 F (36.8 C) (05/29 0359) Pulse Rate:  [52-69] 65 (05/29 0359) Resp:  [9-29] 9 (05/29 0359) BP: (149-193)/(64-108) 167/76 mmHg (05/29 0359) SpO2:  [95 %-100 %] 99 % (05/29 0359) Weight:  [189 lb 2.5 oz (85.8 kg)] 189 lb 2.5 oz (85.8 kg) (05/29 0155) Last BM Date: 08/12/12  Intake/Output from previous day: 05/28 0701 - 05/29 0700 In: 120 [P.O.:120] Out: 2050 [Urine:2050] Intake/Output this shift:    General appearance: alert and no distress GI: soft, non-tender; bowel sounds normal; no masses,  no organomegaly  Lab Results:  Recent Labs  08/13/12 2330 08/14/12 0425 08/15/12 0420  WBC 4.2 3.5* 4.5  HGB 8.5* 8.8* 9.1*  HCT 27.0* 27.6* 28.7*  PLT 304 329 323   BMET  Recent Labs  08/13/12 0719 08/14/12 0425 08/15/12 0420  NA 138 140 138  K 4.1 4.4 4.1  CL 107 105 102  CO2 21 23 25   GLUCOSE 97 108* 93  BUN 21 21 15   CREATININE 1.16 1.17 1.14  CALCIUM 8.2* 8.5 8.9   LFT  Recent Labs  08/14/12 0425  PROT 6.8  ALBUMIN 3.0*  AST 39*  ALT 18  ALKPHOS 72  BILITOT 0.2*   PT/INR  Recent Labs  08/14/12 1744  LABPROT 13.9  INR 1.08   Hepatitis Panel No results found for this basename: HEPBSAG, HCVAB, HEPAIGM, HEPBIGM,  in the last 72 hours C-Diff No results found for this basename: CDIFFTOX,  in the last 72 hours Fecal Lactopherrin No results found for this basename: FECLLACTOFRN,  in the last 72 hours  Studies/Results: Dg Chest 2 View  08/14/2012   *RADIOLOGY REPORT*  Clinical Data: Pre endoscopy.  Shortness of breath and cough.  CHEST - 2 VIEW  Comparison: 08/13/2012.  Findings: Trachea is midline.  Heart is enlarged, stable.  Mild central pulmonary vascular congestion.  Tiny bilateral pleural effusions.  IMPRESSION: Central pulmonary vascular congestion with tiny bilateral pleural effusions.   Original Report  Authenticated By: Leanna Battles, M.[name].   Dg Chest Port 1 View  08/13/2012   *RADIOLOGY REPORT*  Clinical Data: Chest pain and shortness of breath.  PORTABLE CHEST - 1 VIEW  Comparison: Chest 08/06/2012.  Findings: There is cardiomegaly and vascular congestion. Subsegmental atelectasis is seen in the lung bases.  No pneumothorax.  IMPRESSION: Cardiomegaly and pulmonary vascular congestion.   Original Report Authenticated By: Holley Dexter, M.[name].    Medications:  Scheduled: . amLODipine  2.5 mg Oral Daily  . atorvastatin  20 mg Oral q1800  . carvedilol  3.125 mg Oral BID WC  . Chlorhexidine Gluconate Cloth  6 each Topical Q0600  . divalproex  500 mg Oral BID  . furosemide  40 mg Oral Daily  . hydrALAZINE  100 mg Oral Q8H  . mupirocin ointment  1 application Nasal BID  . pantoprazole (PROTONIX) IV  40 mg Intravenous Q12H  . phenytoin  200 mg Oral Daily  . phenytoin  300 mg Oral QHS   Continuous:   Assessment/Plan: 1) ? Small bowel AVM. 2) Anemia.   Dr. Loreta Ave identified a possible bleeding site in the proximal small bowel and treated the site with APC.    Plan: 1) Follow HGB. 2) If stable tomorrow he can be discharged.   LOS: 2 days   Charles [name] 08/15/2012, 7:04 AM

## 2012-08-16 LAB — CBC
HCT: 29 % — ABNORMAL LOW (ref 39.0–52.0)
Hemoglobin: 9.3 g/dL — ABNORMAL LOW (ref 13.0–17.0)
MCH: 27.4 pg (ref 26.0–34.0)
MCHC: 32.1 g/dL (ref 30.0–36.0)
MCV: 85.5 fL (ref 78.0–100.0)
RBC: 3.39 MIL/uL — ABNORMAL LOW (ref 4.22–5.81)

## 2012-08-16 MED ORDER — CARVEDILOL 6.25 MG PO TABS
6.2500 mg | ORAL_TABLET | Freq: Two times a day (BID) | ORAL | Status: DC
  Filled 2012-08-16 (×2): qty 1

## 2012-08-16 MED ORDER — PANTOPRAZOLE SODIUM 40 MG PO TBEC: 40.0000 mg | DELAYED_RELEASE_TABLET | Freq: Every day | ORAL | Status: DC

## 2012-08-16 MED ORDER — HYDROMORPHONE 0.3 MG/ML IV SOLN
INTRAVENOUS | Status: AC
  Filled 2012-08-16: qty 25

## 2012-08-16 MED ORDER — NITROGLYCERIN 0.4 MG SL SUBL: 0.4000 mg | SUBLINGUAL_TABLET | SUBLINGUAL | Status: DC | PRN

## 2012-08-16 MED ORDER — CARVEDILOL 6.25 MG PO TABS: 6.2500 mg | ORAL_TABLET | Freq: Two times a day (BID) | ORAL | Status: DC

## 2012-08-16 MED ORDER — PANTOPRAZOLE SODIUM 40 MG PO TBEC
40.0000 mg | DELAYED_RELEASE_TABLET | Freq: Every day | ORAL | Status: DC
  Administered 2012-08-16: 40 mg via ORAL
  Filled 2012-08-16: qty 1

## 2012-08-16 NOTE — Discharge Summary (Signed)
Internal Medicine Teaching [facility] Discharge Note  Name: Charles [name] MRN: [id number] DOB: 07-29-1945 67 y.o.  Date of Admission: 08/13/2012  6:35 AM Date of Discharge: 08/16/2012 Attending Physician: Inez Catalina, MD  Discharge Diagnosis: Principal Problem:   Chest pain, midsternal Active Problems:   Iron deficiency anemia   CAD (coronary artery disease)   Ischemic cardiomyopathy   Chronic kidney disease (CKD), stage III (moderate)   Seizure disorder   Acute on chronic systolic CHF (congestive heart failure)   Bradycardia   Discharge Medications:   Medication List    TAKE these medications       amLODipine 2.5 MG tablet  Commonly known as:  NORVASC  Take 2.5 mg by mouth daily.     aspirin 81 MG chewable tablet  Chew 81 mg by mouth daily.     benzonatate 200 MG capsule  Commonly known as:  TESSALON  Take 1 capsule (200 mg total) by mouth 3 (three) times daily as needed for cough.     carvedilol 6.25 MG tablet  Commonly known as:  COREG  Take 1 tablet (6.25 mg total) by mouth 2 (two) times daily with a meal.     divalproex 500 MG 24 hr tablet  Commonly known as:  DEPAKOTE ER  Take 500 mg by mouth 2 (two) times daily.     ferrous sulfate 325 (65 FE) MG tablet  Take 325 mg by mouth 2 (two) times daily with a meal.     furosemide 40 MG tablet  Commonly known as:  LASIX  Take 40 mg by mouth daily.     hydrALAZINE 100 MG tablet  Commonly known as:  APRESOLINE  Take 100 mg by mouth every 8 (eight) hours.     lisinopril 20 MG tablet  Commonly known as:  PRINIVIL,ZESTRIL  Take 20 mg by mouth 2 (two) times daily.     nitroGLYCERIN 0.4 MG SL tablet  Commonly known as:  NITROSTAT  Place 1 tablet (0.4 mg total) under the tongue every 5 (five) minutes as needed for chest pain.     pantoprazole 40 MG tablet  Commonly known as:  PROTONIX  Take 1 tablet (40 mg total) by mouth daily.     phenytoin 100 MG ER capsule  Commonly known as:  DILANTIN  Take  200-300 mg by mouth 2 (two) times daily. Take 2 capsules by mouth in the morning and 3 capsules by mouth at bedtime.     potassium chloride 10 MEQ tablet  Commonly known as:  K-DUR,KLOR-CON  Take 10 mEq by mouth daily.     simvastatin 40 MG tablet  Commonly known as:  ZOCOR  Take 40 mg by mouth every evening.     traMADol 50 MG tablet  Commonly known as:  ULTRAM  Take 50 mg by mouth daily.        Disposition and follow-up:   Charles [name] was discharged from [facility] in Stable condition.  At the hospital follow up visit please address  - His blood pressure control, his Coreg may need to be increased - Assess his fluid status, his Lasix dose might need to be increased - Check a CBC to make sure his hemoglobin is stable - Check a BMP to monitor his renal finction  Follow-up Appointments: Follow-up Information   Follow up with Bernerd Limbo On 08/16/2012. (At 9am)    Contact information:   [address] [facility] [employer]  [place] [address] [phone]      Discharge Orders   Future Orders Complete By Expires     (HEART FAILURE PATIENTS) Call MD:  Anytime you have any of the following symptoms: 1) 3 pound weight gain in 24 hours or 5 pounds in 1 week 2) shortness of breath, with or without a dry hacking cough 3) swelling in the hands, feet or stomach 4) if you have to sleep on extra pillows at night in order to breathe.  As directed     Call MD for:  extreme fatigue  As directed     Call MD for:  temperature >100.4  As directed     Diet - low sodium heart healthy  As directed     Discharge instructions  As directed     Comments:      Your Coreg dose has been decreased during this hospitalization. Please be sure to go to your appointment on 6/3 at Dr. Quita Skye office. He needs to evaluate your blood pressure and possibly increase the Coreg or even your Lasix.    Increase activity slowly  As directed         Consultations:   GI, Dr. Elnoria Howard  Procedures Performed:  Dg Chest 2 View  08/14/2012   *RADIOLOGY REPORT*  Clinical Data: Pre endoscopy.  Shortness of breath and cough.  CHEST - 2 VIEW  Comparison: 08/13/2012.  Findings: Trachea is midline.  Heart is enlarged, stable.  Mild central pulmonary vascular congestion.  Tiny bilateral pleural effusions.  IMPRESSION: Central pulmonary vascular congestion with tiny bilateral pleural effusions.   Original Report Authenticated By: Leanna Battles, M.D.   Dg Chest 2 View  08/06/2012   *RADIOLOGY REPORT*  Clinical Data: Chest pain.  CHEST - 2 VIEW  Comparison: 06/14/2012.  Findings: The heart is enlarged but stable.  The mediastinal and hilar contours are unchanged.  There are surgical changes from bypass surgery.  Chronic underlying lung changes with overlying vascular congestion but no overt pulmonary edema or pleural effusion.  The bony thorax is intact.  IMPRESSION: Cardiac enlargement and vascular congestion superimposed on underlying chronic lung changes.  No overt pulmonary edema or definite pleural effusions.   Original Report Authenticated By: Rudie Meyer, M.D.   Dg Chest Port 1 View  08/13/2012   *RADIOLOGY REPORT*  Clinical Data: Chest pain and shortness of breath.  PORTABLE CHEST - 1 VIEW  Comparison: Chest 08/06/2012.  Findings: There is cardiomegaly and vascular congestion. Subsegmental atelectasis is seen in the lung bases.  No pneumothorax.  IMPRESSION: Cardiomegaly and pulmonary vascular congestion.   Original Report Authenticated By: Holley Dexter, M.D.    Admission  History of Present Illness: 67 year old man with past medical history significant CAD s/p CABG in 2009, CHF with EF of 35- 40% from echo in 2012, history of AVM s/p capsule endoscopy in 2012, with work up including EGD 2012 that showed hiatal hernia , colonoscopy in 2012 that showed hemorrhoids presents to the ED with chest pain and SOB.  Patient reports that he has been having  black stools for last 4 days. He was not feeling well, since yesterday when he was feeling tired and was sleeping more than usual. He woke up this morning at 5: 30 AM with chest pain. He describes his chest pain was sharp at the beginning , 10/10 and now it has subsided a little and describes it more like a dull ache , rates 6/10 , located in the lower sternal area. He  also reports associated SOB and nausea. But denies any vomiting or diaphoresis. He does report some baseline SOB and states that his SOB is bad today but not at its worst. He is noted to have lower extremity edema but he can not tell for sure if it is getting worse. He also reports that he also felt dizzy ( lightheaded ) and fell this AM but is not sure if he lost conscious. He denies any head trauma. Also denies any hematemesis, seizure like activity, vomiting ,palpiations.  He was seen in the ED on 5/20 for cough and SOB - was d/c home with stable work up ( labs and imaging).  Review of Systems:  Constitutional: negative for anorexia, chills, fatigue, fevers, malaise and night sweats  HEENT: negative for earaches, epistaxis, hearing loss, nasal congestion, snoring and sore throat  Respiratory: negative for cough, dyspnea on exertion, sputum, stridor and wheezing  Cardiovascular: negative for irregular heart beat, palpitations and paroxysmal nocturnal dyspnea. Positive for chest pain, orthopnea, lower extremity swelling  Gastrointestinal: negative for abdominal pain, change in bowel habits, dyspepsia, reflux symptoms and vomiting  Hematologic/lymphatic: negative for bleeding, easy bruising and lymphadenopathy  Musculoskeletal:negative for arthralgias, muscle weakness, myalgias and stiff joints  Neurological: negative for coordination problems, dizziness, gait problems, seizures and speech problems  Physical Exam:  Blood pressure 168/87, pulse 60, temperature 98.1 F (36.7 C), temperature source Oral, resp. rate 20, height 5\' 5"  (1.651  m), weight 190 lb (86.183 kg), SpO2 100.00%.  BP 168/87  Pulse 60  Temp(Src) 98.1 F (36.7 C) (Oral)  Resp 20  Ht 5\' 5"  (1.651 m)  Wt 190 lb (86.183 kg)  BMI 31.62 kg/m2  SpO2 100%  General Appearance:  Alert, cooperative, no distress, appears stated age   Head:  Normocephalic, without obvious abnormality, atraumatic   Eyes:  PERRL, conjunctiva/corneas clear, EOM's intact, fundi  benign, both eyes   Ears:  Normal TM's and external ear canals, both ears   Nose:  Nares normal, septum midline, mucosa normal, no drainage or sinus tenderness   Throat:  Lips, mucosa, and tongue normal; teeth and gums normal   Neck:  Supple, symmetrical, trachea midline, JVD +  thyroid: No enlargement/tenderness/nodules; no carotid  bruit or JVD   Back:  Symmetric, no curvature, ROM normal, no CVA tenderness   Lungs:  Clear to auscultation bilaterally, respirations unlabored   Chest wall:  Tender to palpation in the lower third of sternum.   Heart:  Regular rate and rhythm, S1 and S2 normal, no murmur, rub or gallop   Abdomen:  Soft, tender to palpation in the epigastrium, bowel sounds active all four quadrants,  no masses, no organomegaly   Genitalia:  Normal male without lesion, discharge or tenderness   Rectal:  Normal tone, normal prostate, no masses or tenderness;  guaiac positive stool   Extremities:  2+ pitting edema upto mid- calf.   Pulses:  2+ and symmetric all extremities   Skin:  Skin color, texture, turgor normal, no rashes or lesions   Lymph nodes:  Cervical, supraclavicular, and axillary nodes normal   Neurologic:  CNII-XII intact. Normal strength, sensation and reflexes  throughout      Hospital Course by problem list: #Acute blood loss anemia on chronic anemia: His baseline Hb~ 9.5 . He presents with Hb of 7.4 with the h/o black stools for 4 days and FOBT positive in the ED. He has a history of GI bleed in 2012 that was thought to be likely 2/2  proximal intestine AVM. He also had EGD  which showed a hiatal hernia and a colonoscopy showing hemorrhoids. Due to his Hgb, he was transfused 1u pRBCs and was started on IV Protonix. GI performed an EGD on 5/28 which revealed a small mucosal bleed that was cauterized. His Hgb is 9.1 today, improved from yesterday, and he denies any further black stools. Hgb has improved and is around his baseline at 9.3. He is ready for discharge home today  - Protonix 40 mg po daily  - Restarting ASA 81mg  at discharge   #HTN: BP have been labile. Continued his Amlodipine on admission. Held hydralazine and lisinopril and decreased the Coreg dose in setting of active GI bleed. Hydralazine was restarted 5/28 and the Lisinopril 5/29. Increasing the Coreg today. His PCP will have to adjust the Coreg depending on his BP and his HR, as he has been mildly bradycardic in the hospital.  - Amlodipine 2.5mg  po daily  - Lasix 40mg  po daily  - Lisinopril 20mg  po BID  - Hydralazine 100mg  po TID  - Coreg 6.25mg  po daily   #Acute on chronic systolic CHF exaceberation: proBNP 862.5 on admission. Likely 2/2 poorly controlled HTN. Echo from 2012 shows EF of 35- 40%. His exam is consistent with volume overload with pedal edema and JVD. CXR shows cardiomegaly and pulmonary vascular congestion. S/p 20 mg IV lasix X1, down 2.5L this hospitalization. His home Lasix was restarted, 40mg  po daily. His Lasix dose may need to be increased as an outpatient.  - Lasix 40mg  po daily   #Chest pain: Resolved.  Patient woke up on the morning of admission with substernal chest pain. Differentials for his chest pain including demand ischemia from anemia due to GI bleed vs ACS given his h/o CAD s/p CABG vs atrial stretching from CHF exaceberation. His EKG was unchanged compared to previous tracings without new ST- T changes. Troponins have been negative. CXR with vascular congestion, so chest pain is likely related to CHF exacerbation vs anemia. He was given 20 IV lasix x1, and then started on  his home regime on 40 po daily. GI was consulted for EGD. No further chest pain for the past 3 days. - Continue Protonix  - Continue Lasix  # Fall: Patient reports falling this AM. It is unclear if he lost conscious. It could be related to dizziness from his worsening anemia. Not orthostatic on admission. Remained stable on feet since admission.   #Seizure disorder: Stable.  Continuing home meds- dilantin and depakote.   # CAD s/p CABG: On admission, held ASA and lisinopril with his concomitant GI bleed. Continued Coreg but at a very low dose with holding parameters (as he has been bradycardic, hold for SBP < 90 mm Hg). His Lisinopril was restarted 5/29, and the Coreg dose was increased to half his home dose 2/2 continued mild bradycardia. His Coreg will likely need to be up-titrated by his PCP.   #CKD stage III: Baseline Cr is 1.5- 1.6. He presented with Cr of 1.16. 1.14 on 5/29. This will need to be monitored as an outpatient.    Discharge Vitals:  BP 143/95  Pulse 61  Temp(Src) 97.2 F (36.2 C) (Oral)  Resp 18  Ht 5\' 5"  (1.651 m)  Wt 181 lb 11.2 oz (82.419 kg)  BMI 30.24 kg/m2  SpO2 100%  Discharge Labs:  Results for orders placed during the hospital encounter of 08/13/12 (from the past 24 hour(s))  CBC     Status: Abnormal  Collection Time    08/16/12  6:15 AM      Result Value Range   WBC 3.8 (*) 4.0 - 10.5 K/uL   RBC 3.39 (*) 4.22 - 5.81 MIL/uL   Hemoglobin 9.3 (*) 13.0 - 17.0 g/dL   HCT 16.1 (*) 09.6 - 04.5 %   MCV 85.5  78.0 - 100.0 fL   MCH 27.4  26.0 - 34.0 pg   MCHC 32.1  30.0 - 36.0 g/dL   RDW 40.9 (*) 81.1 - 91.4 %   Platelets 329  150 - 400 K/uL    Signed: Genelle Gather 08/16/2012, 11:41 AM   Time Spent on Discharge: Services Ordered on Discharge: None Equipment Ordered on Discharge: None

## 2012-08-16 NOTE — Progress Notes (Signed)
Subjective: No complaints.  He wants to go home.  Objective: Vital signs in last 24 hours: Temp:  [97 F (36.1 C)-98.4 F (36.9 C)] 97.2 F (36.2 C) (05/30 0602) Pulse Rate:  [61-66] 61 (05/30 0602) Resp:  [16-18] 18 (05/30 0602) BP: (129-180)/(65-100) 143/95 mmHg (05/30 0602) SpO2:  [97 %-100 %] 100 % (05/30 0602) Weight:  [181 lb 11.2 oz (82.419 kg)-182 lb 8 oz (82.781 kg)] 181 lb 11.2 oz (82.419 kg) (05/30 0602) Last BM Date: 08/12/12  Intake/Output from previous day: 05/29 0701 - 05/30 0700 In: 440 [P.O.:440] Out: -  Intake/Output this shift:    General appearance: alert and no distress GI: soft, non-tender; bowel sounds normal; no masses,  no organomegaly  Lab Results:  Recent Labs  08/14/12 0425 08/15/12 0420 08/16/12 0615  WBC 3.5* 4.5 3.8*  HGB 8.8* 9.1* 9.3*  HCT 27.6* 28.7* 29.0*  PLT 329 323 329   BMET  Recent Labs  08/14/12 0425 08/15/12 0420  NA 140 138  K 4.4 4.1  CL 105 102  CO2 23 25  GLUCOSE 108* 93  BUN 21 15  CREATININE 1.17 1.14  CALCIUM 8.5 8.9   LFT  Recent Labs  08/14/12 0425  PROT 6.8  ALBUMIN 3.0*  AST 39*  ALT 18  ALKPHOS 72  BILITOT 0.2*   PT/INR  Recent Labs  08/14/12 1744  LABPROT 13.9  INR 1.08   Hepatitis Panel No results found for this basename: HEPBSAG, HCVAB, HEPAIGM, HEPBIGM,  in the last 72 hours C-Diff No results found for this basename: CDIFFTOX,  in the last 72 hours Fecal Lactopherrin No results found for this basename: FECLLACTOFRN,  in the last 72 hours  Studies/Results: Dg Chest 2 View  08/14/2012   *RADIOLOGY REPORT*  Clinical Data: Pre endoscopy.  Shortness of breath and cough.  CHEST - 2 VIEW  Comparison: 08/13/2012.  Findings: Trachea is midline.  Heart is enlarged, stable.  Mild central pulmonary vascular congestion.  Tiny bilateral pleural effusions.  IMPRESSION: Central pulmonary vascular congestion with tiny bilateral pleural effusions.   Original Report Authenticated By: Leanna Battles, M.D.    Medications:  Scheduled: . amLODipine  2.5 mg Oral Daily  . atorvastatin  20 mg Oral q1800  . carvedilol  3.125 mg Oral BID WC  . Chlorhexidine Gluconate Cloth  6 each Topical Q0600  . divalproex  500 mg Oral BID  . furosemide  40 mg Oral Daily  . hydrALAZINE  100 mg Oral Q8H  . lisinopril  20 mg Oral BID  . mupirocin ointment  1 application Nasal BID  . pantoprazole (PROTONIX) IV  40 mg Intravenous Q12H  . phenytoin  200 mg Oral Daily  . phenytoin  300 mg Oral QHS   Continuous:   Assessment/Plan: 1) ? Gastric AVM.    His HGB is stable and clinically he looks well.  No further melena.  Plan: 1) No further GI intervention.  Signing off. 2) Follow up with PCP.  LOS: 3 days   Ido Wollman D 08/16/2012, 8:04 AM

## 2012-08-16 NOTE — Progress Notes (Addendum)
Subjective: Denies any further chest pain or SOB. He states that he wants to go home.  Objective: Vital signs in last 24 hours: Filed Vitals:   08/15/12 1744 08/15/12 2144 08/16/12 0153 08/16/12 0602  BP:  129/65 180/93 143/95  Pulse: 66 65 62 61  Temp:  97 F (36.1 C) 97 F (36.1 C) 97.2 F (36.2 C)  TempSrc:  Oral Oral Oral  Resp:  18 18 18   Height:      Weight:    181 lb 11.2 oz (82.419 kg)  SpO2:  97% 99% 100%   Weight change: -6 lb 10.5 oz (-3.018 kg)  Intake/Output Summary (Last 24 hours) at 08/16/12 0827 Last data filed at 08/15/12 1938  Gross per 24 hour  Intake    440 ml  Output      0 ml  Net    440 ml   Vitals reviewed. General: Comfortably lying in bed, NAD HEENT: PERRL, EOMI, no scleral icterus Cardiac: RRR, no rubs, murmurs or gallops Pulm: Clear to auscultation bilaterally, no wheezes, rales, or rhonchi Abd: Soft, nontender, nondistended Ext: Warm and well perfused, trace edema at the ankles bilaterally Neuro: Alert and oriented X3, cranial nerves II-XII grossly intact, strength and sensation to light touch equal in bilateral upper and lower extremities  Lab Results: Basic Metabolic Panel:  Recent Labs Lab 08/14/12 0425 08/15/12 0420  NA 140 138  K 4.4 4.1  CL 105 102  CO2 23 25  GLUCOSE 108* 93  BUN 21 15  CREATININE 1.17 1.14  CALCIUM 8.5 8.9   Liver Function Tests:  Recent Labs Lab 08/14/12 0425  AST 39*  ALT 18  ALKPHOS 72  BILITOT 0.2*  PROT 6.8  ALBUMIN 3.0*   CBC:  Recent Labs Lab 08/15/12 0420 08/16/12 0615  WBC 4.5 3.8*  HGB 9.1* 9.3*  HCT 28.7* 29.0*  MCV 85.7 85.5  PLT 323 329   Cardiac Enzymes:  Recent Labs Lab 08/14/12 0515 08/14/12 1108 08/14/12 1744  TROPONINI <0.30 <0.30 <0.30   BNP:  Recent Labs Lab 08/13/12 0720  PROBNP 862.5*   Urine Drug Screen: Drugs of Abuse     Component Value Date/Time   LABOPIA POSITIVE* 06/14/2012 1112   COCAINSCRNUR NONE DETECTED 06/14/2012 1112   LABBENZ NONE  DETECTED 06/14/2012 1112   AMPHETMU NONE DETECTED 06/14/2012 1112   THCU NONE DETECTED 06/14/2012 1112   LABBARB NONE DETECTED 06/14/2012 1112     Micro Results: Recent Results (from the past 240 hour(s))  MRSA PCR SCREENING     Status: Abnormal   Collection Time    08/13/12 11:19 AM      Result Value Range Status   MRSA by PCR POSITIVE (*) NEGATIVE Final   Comment:            The GeneXpert MRSA Assay (FDA     approved for NASAL specimens     only), is one component of a     comprehensive MRSA colonization     surveillance program. It is not     intended to diagnose MRSA     infection nor to guide or     monitor treatment for     MRSA infections.     RESULT CALLED TO, READ BACK BY AND VERIFIED WITH:     L. MILLER RN 13:10 08/13/12 ([email or web address])   Studies/Results: Dg Chest 2 View  08/14/2012   *RADIOLOGY REPORT*  Clinical Data: Pre endoscopy.  Shortness of breath and cough.  CHEST -  2 VIEW  Comparison: 08/13/2012.  Findings: Trachea is midline.  Heart is enlarged, stable.  Mild central pulmonary vascular congestion.  Tiny bilateral pleural effusions.  IMPRESSION: Central pulmonary vascular congestion with tiny bilateral pleural effusions.   Original Report Authenticated By: Leanna Battles, M.D.   Medications: I have reviewed the patient's current medications. Scheduled Meds: . amLODipine  2.5 mg Oral Daily  . atorvastatin  20 mg Oral q1800  . carvedilol  3.125 mg Oral BID WC  . Chlorhexidine Gluconate Cloth  6 each Topical Q0600  . divalproex  500 mg Oral BID  . furosemide  40 mg Oral Daily  . hydrALAZINE  100 mg Oral Q8H  . lisinopril  20 mg Oral BID  . mupirocin ointment  1 application Nasal BID  . pantoprazole (PROTONIX) IV  40 mg Intravenous Q12H  . phenytoin  200 mg Oral Daily  . phenytoin  300 mg Oral QHS   Continuous Infusions:   PRN Meds:.albuterol, alum & mag hydroxide-simeth, ipratropium, nitroGLYCERIN, ondansetron (ZOFRAN) IV, ondansetron Assessment/Plan:  #Acute  blood loss anemia on chronic anemia: His baseline Hb~ 9.5 . He presents with Hb of 7.4 with the h/o black stools for 4 days and FOBT positive in the ED. He has a history of GI bleed in 2012 that was thought to be likely 2/2 proximal intestine AVM. He also had EGD which showed a hiatal hernia and a colonoscopy showing hemorrhoids. Due to his Hgb, he was transfused 1u pRBCs and was started on IV Protonix. GI performed an EGD on 5/28 which revealed a small mucosal bleed that was cauterized. His Hgb is 9.1 today, improved from yesterday, and he denies any further black stools. Hgb has improved and is around his baseline at 9.3. He is ready for discharge home today - Protonix 40 mg po daily - Regular diet, per GI - Restarting ASA at discharge  #HTN: BP have been labile. Continued his Amlodipine on admission. Held hydralazine and lisinopril and decreased the Coreg dose in setting of active GI bleed. Hydralazine was restarted 5/28 and the Lisinopril 5/29. Increasing the Coreg today. His PCP will have to adjust the Coreg depending on his BP and his HR, as he has been mildly bradycardic in the hospital.  - Amlodipine 2.5mg  po daily - Lasix 40mg  po daily - Lisinopril 20mg  po BID - Hydralazine 100mg  po TID - Coreg 6.25mg  po daily  #Acute on chronic systolic CHF exaceberation: proBNP 862.5 on admission. Likely 2/2 poorly controlled HTN. Echo from 2012 shows EF of 35- 40%. His exam is consistent with volume overload with pedal edema and JVD. CXR shows cardiomegaly and pulmonary vascular congestion. S/p 20 mg IV lasix  X1, down 2.5L this hospitalization. His home Lasix was restarted, 40mg  po daily. His Lasix dose may need to be increased as an outpatient. - Lasix 40mg  po daily - Strict I and O's  - daily weights   #Chest pain: Resolved. Patient woke up on the morning of admission with substernal chest pain. Differentials for his chest pain including demand ischemia from anemia due to GI bleed vs ACS given his  h/o CAD s/p CABG vs atrial stretching from CHF exaceberation. His EKG was unchanged compared to previous tracings without new ST- T changes. Troponins have been negative. CXR with vascular congestion, so chest pain is likely related to CHF exacerbation vs anemia. He was given 20 IV lasix x1, and then started on his home regime on 40 po daily. GI was consulted. - Protonix  -  GI recs  # Fall: Patient reports falling this AM. It is unclear if he lost conscious. It could be related to dizziness from his worsening anemia. Not orthostatic on admission. Stable on feet today.  #Seizure disorder: Stable.  Continuing home meds- dilantin and depakote.   # CAD s/p CABG: On admission, held ASA and lisinopril with his concomitant GI bleed. Continued Coreg but at a very low dose with holding parameters (as he has been bradycardic, hold for SBP < 90 mm Hg). His Lisinopril was restarted 5/29, and the Coreg dose was increased to half his home dose 2/2 continued mild bradycardia. His Coreg will need to be up-titrated by his PCP.  #CKD stage III: Baseline Cr is 1.5- 1.6. He presented with Cr of 1.16. 1.14 on 5/29. This will be monitored as an outpatient.  #DVT:  SCD's    Dispo: Disposition is deferred at this time, awaiting improvement of current medical problems.  Anticipated discharge in approximately 1-2 day(s).   The patient does have a current PCP (MITCHELL, JOHN), therefore will not be requiring OPC follow-up after discharge.   The patient does not have transportation limitations that hinder transportation to clinic appointments.  .Services Needed at time of discharge: Y = Yes, Blank = No PT:   OT:   RN:   Equipment:   Other:     LOS: 3 days   Genelle Gather 08/16/2012, 8:27 AM

## 2012-08-16 NOTE — Progress Notes (Signed)
Pt being dc to home, pt given dc instructions, medications reviewed and follow up appointments given, pt verbalized understanding, pt walked out with staff, pt stable
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
## 2012-10-11 ENCOUNTER — Emergency Department (HOSPITAL_COMMUNITY)

## 2012-10-11 ENCOUNTER — Emergency Department (HOSPITAL_COMMUNITY)
Admission: EM | Admit: 2012-10-11 | Discharge: 2012-10-12 | Disposition: A | Discharge status: Home / Self Care | Attending: Dermatology | Admitting: Dermatology

## 2012-10-11 ENCOUNTER — Encounter (HOSPITAL_COMMUNITY): Payer: Self-pay | Admitting: *Deleted

## 2012-10-11 DIAGNOSIS — N183 Chronic kidney disease, stage 3 unspecified: Secondary | ICD-10-CM | POA: Insufficient documentation

## 2012-10-11 DIAGNOSIS — R109 Unspecified abdominal pain: Secondary | ICD-10-CM | POA: Insufficient documentation

## 2012-10-11 DIAGNOSIS — R5381 Other malaise: Secondary | ICD-10-CM | POA: Diagnosis present

## 2012-10-11 DIAGNOSIS — E119 Type 2 diabetes mellitus without complications: Secondary | ICD-10-CM | POA: Insufficient documentation

## 2012-10-11 DIAGNOSIS — Z8669 Personal history of other diseases of the nervous system and sense organs: Secondary | ICD-10-CM | POA: Insufficient documentation

## 2012-10-11 DIAGNOSIS — Z951 Presence of aortocoronary bypass graft: Secondary | ICD-10-CM | POA: Insufficient documentation

## 2012-10-11 DIAGNOSIS — G40909 Epilepsy, unspecified, not intractable, without status epilepticus: Secondary | ICD-10-CM | POA: Diagnosis not present

## 2012-10-11 DIAGNOSIS — R42 Dizziness and giddiness: Secondary | ICD-10-CM | POA: Diagnosis not present

## 2012-10-11 DIAGNOSIS — E785 Hyperlipidemia, unspecified: Secondary | ICD-10-CM | POA: Insufficient documentation

## 2012-10-11 DIAGNOSIS — R05 Cough: Secondary | ICD-10-CM | POA: Insufficient documentation

## 2012-10-11 DIAGNOSIS — I129 Hypertensive chronic kidney disease with stage 1 through stage 4 chronic kidney disease, or unspecified chronic kidney disease: Secondary | ICD-10-CM | POA: Insufficient documentation

## 2012-10-11 DIAGNOSIS — R059 Cough, unspecified: Secondary | ICD-10-CM | POA: Insufficient documentation

## 2012-10-11 DIAGNOSIS — Z7982 Long term (current) use of aspirin: Secondary | ICD-10-CM | POA: Insufficient documentation

## 2012-10-11 DIAGNOSIS — Z8679 Personal history of other diseases of the circulatory system: Secondary | ICD-10-CM | POA: Insufficient documentation

## 2012-10-11 DIAGNOSIS — Z79899 Other long term (current) drug therapy: Secondary | ICD-10-CM | POA: Insufficient documentation

## 2012-10-11 DIAGNOSIS — D649 Anemia, unspecified: Secondary | ICD-10-CM | POA: Insufficient documentation

## 2012-10-11 DIAGNOSIS — I5023 Acute on chronic systolic (congestive) heart failure: Secondary | ICD-10-CM | POA: Insufficient documentation

## 2012-10-11 DIAGNOSIS — N189 Chronic kidney disease, unspecified: Secondary | ICD-10-CM

## 2012-10-11 DIAGNOSIS — Z8739 Personal history of other diseases of the musculoskeletal system and connective tissue: Secondary | ICD-10-CM | POA: Insufficient documentation

## 2012-10-11 DIAGNOSIS — I252 Old myocardial infarction: Secondary | ICD-10-CM | POA: Insufficient documentation

## 2012-10-11 DIAGNOSIS — Z87891 Personal history of nicotine dependence: Secondary | ICD-10-CM | POA: Insufficient documentation

## 2012-10-11 DIAGNOSIS — Z87798 Personal history of other (corrected) congenital malformations: Secondary | ICD-10-CM | POA: Insufficient documentation

## 2012-10-11 DIAGNOSIS — I2510 Atherosclerotic heart disease of native coronary artery without angina pectoris: Secondary | ICD-10-CM | POA: Insufficient documentation

## 2012-10-11 DIAGNOSIS — Z8719 Personal history of other diseases of the digestive system: Secondary | ICD-10-CM | POA: Insufficient documentation

## 2012-10-11 DIAGNOSIS — R195 Other fecal abnormalities: Secondary | ICD-10-CM

## 2012-10-11 LAB — CBC WITH DIFFERENTIAL/PLATELET
Basophils Absolute: 0 10*3/uL (ref 0.0–0.1)
Eosinophils Absolute: 0.2 10*3/uL (ref 0.0–0.7)
Eosinophils Relative: 3 % (ref 0–5)
HCT: 31.8 % — ABNORMAL LOW (ref 39.0–52.0)
MCH: 27.6 pg (ref 26.0–34.0)
MCHC: 33 g/dL (ref 30.0–36.0)
MCV: 83.7 fL (ref 78.0–100.0)
Monocytes Absolute: 0.3 10*3/uL (ref 0.1–1.0)
Platelets: 407 10*3/uL — ABNORMAL HIGH (ref 150–400)
RDW: 16.9 % — ABNORMAL HIGH (ref 11.5–15.5)

## 2012-10-11 LAB — COMPREHENSIVE METABOLIC PANEL
ALT: 41 U/L (ref 0–53)
AST: 94 U/L — ABNORMAL HIGH (ref 0–37)
CO2: 25 mEq/L (ref 19–32)
Calcium: 9.5 mg/dL (ref 8.4–10.5)
Creatinine, Ser: 1.54 mg/dL — ABNORMAL HIGH (ref 0.50–1.35)
GFR calc Af Amer: 52 mL/min — ABNORMAL LOW (ref >90)
GFR calc non Af Amer: 45 mL/min — ABNORMAL LOW (ref >90)
Sodium: 133 mEq/L — ABNORMAL LOW (ref 135–145)
Total Protein: 7.9 g/dL (ref 6.0–8.3)

## 2012-10-11 LAB — URINE MICROSCOPIC-ADD ON

## 2012-10-11 LAB — URINALYSIS, ROUTINE W REFLEX MICROSCOPIC
Glucose, UA: NEGATIVE mg/dL
Hgb urine dipstick: NEGATIVE
Protein, ur: NEGATIVE mg/dL
Specific Gravity, Urine: 1.015 (ref 1.005–1.030)
Urobilinogen, UA: 0.2 mg/dL (ref 0.0–1.0)

## 2012-10-11 LAB — PHENYTOIN LEVEL, TOTAL: Phenytoin Lvl: <2.5 ug/mL — ABNORMAL LOW (ref 10.0–20.0)

## 2012-10-11 LAB — VALPROIC ACID LEVEL: Valproic Acid Lvl: 22.8 ug/mL — ABNORMAL LOW (ref 50.0–100.0)

## 2012-10-11 MED ORDER — SODIUM CHLORIDE 0.9 % IV BOLUS (SEPSIS)
500.0000 mL | Freq: Once | INTRAVENOUS | Status: AC
  Administered 2012-10-11: 500 mL via INTRAVENOUS

## 2012-10-11 NOTE — ED Notes (Signed)
Pt given water and [place] sand.

## 2012-10-11 NOTE — ED Notes (Signed)
Pt in custody of [facility] Department

## 2012-10-11 NOTE — ED Provider Notes (Signed)
CSN: [id number]     Arrival date & time 10/11/12  1546 History     First MD Initiated Contact with Patient 10/11/12 2023     Chief Complaint  Patient presents with  . Fatigue  . Abdominal Pain   (Consider location/radiation/quality/duration/timing/severity/associated sxs/prior Treatment) Patient is a 67 y.o. male presenting with abdominal pain. The history is provided by the patient.  Abdominal Pain Associated symptoms include abdominal pain. Pertinent negatives include no chest pain, no headaches and no shortness of breath.  pt with hx chronic renal insufficiency, cadcabg, htn, gi bleed, dm, presents w law enforcement from jail w c/o generalized weakness.  Pt states earlier today was feeling fatigued, generally weak, lightheaded when standing.  Per report, bp transiently low.  Pt denies any trauma or fall. No syncope. No headache. Denies any current or recent chest pain. No trouble breathing. Occasional non productive cough. No fever or chills. No abd pain. No nvd. No melena or rectal bleeding. No dysuria or gu c/o. Denies recent change in meds or new meds.      Past Medical History  Diagnosis Date  . Iron deficiency anemia   . Hypertension   . Chronic systolic heart failure     2D Echo (03/2010) - EF 35-40% with akinesis of inferoposterior myocardium  . CAD (coronary artery disease)     s/p 3-vessel CABG (12/2007) // 100% RCA stenosis with collaterals from left system. Severe bifurcation lesions of proxima CXA and OM. Moderate LAD diseaseFollowed by Dr. [facility] in [facility]  . Hyperlipidemia LDL goal <70   . Obstructive sleep apnea     not on home CPAP  . Ischemic cardiomyopathy   . Chronic kidney disease (CKD), stage III (moderate)     BL SCr 1.5-1.6  . Homelessness   . 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 // Colonoscopy and EGD in 03/2010 negative per report (record not found)  .  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 Polysubstance abuse     cocaine, THC  . AV malformation of gastrointestinal tract   . Family history of early CAD   . DDD (degenerative disc disease), lumbar   . Renal artery stenosis   . Peripheral vascular disease   . Seizure disorder   . Diabetes   . Acute on chronic systolic CHF (congestive heart failure) 08/13/2012  . Myocardial infarction   . Seizures    Past Surgical History  Procedure Laterality Date  . Coronary artery bypass graft  12/2007  . Apc  03/2010    To treat small bowel AVMs  . Esophagogastroduodenoscopy N/A 08/14/2012    Procedure: ESOPHAGOGASTRODUODENOSCOPY (EGD);  Surgeon: Charna Elizabeth, MD;  Location: [facility] ENDOSCOPY;  Service: Endoscopy;  Laterality: N/A;  . Hot hemostasis N/A 08/14/2012    Procedure: HOT HEMOSTASIS (ARGON PLASMA COAGULATION/BICAP);  Surgeon: Charna Elizabeth, MD;  Location: [facility] ENDOSCOPY;  Service: Endoscopy;  Laterality: N/A;   Family History  Problem Relation Age of Onset  . Heart disease Mother     unknown type  . Heart disease Father 77    died of MI at 52yo  . Heart disease Paternal Grandfather 46    died of MI  . Heart disease    . Heart disease Brother 30   History  Substance Use Topics  . Smoking status:  Former Smoker -- 0.20 packs/day for 13 years    Types: Cigarettes    Quit date: 03/20/1997  . Smokeless tobacco: Never Used  . Alcohol Use: Yes     Comment: occasionally drinks, a few times a month    Review of Systems  Constitutional: Negative for fever.  HENT: Negative for sore throat, neck pain and neck stiffness.   Eyes: Negative for redness and visual disturbance.  Respiratory: Positive for cough. Negative for shortness of breath.   Cardiovascular: Negative for chest pain and leg swelling.  Gastrointestinal: Positive for abdominal pain. Negative for vomiting, diarrhea and blood in stool.  Genitourinary: Negative for  dysuria and flank pain.  Musculoskeletal: Negative for back pain.  Skin: Negative for rash.  Neurological: Negative for numbness and headaches.  Hematological: Does not bruise/bleed easily.  Psychiatric/Behavioral: Negative for confusion.    Allergies  Motrin and Tylenol  Home Medications   Current Outpatient Rx  Name  Route  Sig  Dispense  Refill  . amLODipine (NORVASC) 2.5 MG tablet   Oral   Take 2.5 mg by mouth daily.         [name] aspirin 81 MG chewable tablet   Oral   Chew 81 mg by mouth daily.         . benzonatate (TESSALON) 200 MG capsule   Oral   Take 1 capsule (200 mg total) by mouth 3 (three) times daily as needed for cough.   20 capsule   0   . carvedilol (COREG) 6.25 MG tablet   Oral   Take 1 tablet (6.25 mg total) by mouth 2 (two) times daily with a meal.   60 tablet   1   . divalproex (DEPAKOTE ER) 500 MG 24 hr tablet   Oral   Take 500 mg by mouth 2 (two) times daily.         . ferrous sulfate 325 (65 FE) MG tablet   Oral   Take 325 mg by mouth 2 (two) times daily with a meal.         . furosemide (LASIX) 40 MG tablet   Oral   Take 40 mg by mouth daily.         . hydrALAZINE (APRESOLINE) 100 MG tablet   Oral   Take 100 mg by mouth every 8 (eight) hours.         [name] lisinopril (PRINIVIL,ZESTRIL) 20 MG tablet   Oral   Take 20 mg by mouth 2 (two) times daily.         . nitroGLYCERIN (NITROSTAT) 0.4 MG SL tablet   Sublingual   Place 1 tablet (0.4 mg total) under the tongue every 5 (five) minutes as needed for chest pain.   30 tablet   3   . pantoprazole (PROTONIX) 40 MG tablet   Oral   Take 1 tablet (40 mg total) by mouth daily.   30 tablet   11   . phenytoin (DILANTIN) 100 MG ER capsule   Oral   Take 200-300 mg by mouth 2 (two) times daily. Take 2 capsules by mouth in the morning and 3 capsules by mouth at bedtime.         . potassium chloride (K-DUR,KLOR-CON) 10 MEQ tablet   Oral   Take 10 mEq by mouth daily.          . simvastatin (ZOCOR) 40 MG tablet   Oral   Take 40 mg by mouth every evening.         [name]  traMADol (ULTRAM) 50 MG tablet   Oral   Take 50 mg by mouth daily.          BP 172/91  Pulse 52  Temp(Src) 97.5 F (36.4 C) (Oral)  Resp 18  SpO2 99% Physical Exam  Nursing note and vitals reviewed. Constitutional: He is oriented to person, place, and time. He appears well-developed and well-nourished. No distress.  HENT:  Head: Atraumatic.  Mouth/Throat: Oropharynx is clear and moist.  Eyes: Conjunctivae are normal. Pupils are equal, round, and reactive to light. No scleral icterus.  Neck: Neck supple. No tracheal deviation present.  Cardiovascular: Normal rate, regular rhythm, normal heart sounds and intact distal pulses.   Pulmonary/Chest: Effort normal and breath sounds normal. No accessory muscle usage. No respiratory distress.  Abdominal: Soft. Bowel sounds are normal. He exhibits no distension and no mass. There is no tenderness. There is no rebound and no guarding.  Genitourinary:  No cva tenderness. Rectal dark brown stool, no melena.   Musculoskeletal: Normal range of motion. He exhibits no edema and no tenderness.  Neurological: He is alert and oriented to person, place, and time.  Motor intact bil. Steady gait.   Skin: Skin is warm and dry.  Psychiatric: He has a normal mood and affect.    ED Course   Procedures (including critical care time)  Results for orders placed during the hospital encounter of 10/11/12  CBC WITH DIFFERENTIAL      Result Value Range   WBC 6.2  4.0 - 10.5 K/uL   RBC 3.80 (*) 4.22 - 5.81 MIL/uL   Hemoglobin 10.5 (*) 13.0 - 17.0 g/dL   HCT 16.1 (*) 09.6 - 04.5 %   MCV 83.7  78.0 - 100.0 fL   MCH 27.6  26.0 - 34.0 pg   MCHC 33.0  30.0 - 36.0 g/dL   RDW 40.9 (*) 81.1 - 91.4 %   Platelets 407 (*) 150 - 400 K/uL   Neutrophils Relative % 76  43 - 77 %   Neutro Abs 4.7  1.7 - 7.7 K/uL   Lymphocytes Relative 16  12 - 46 %   Lymphs Abs 1.0  0.7  - 4.0 K/uL   Monocytes Relative 5  3 - 12 %   Monocytes Absolute 0.3  0.1 - 1.0 K/uL   Eosinophils Relative 3  0 - 5 %   Eosinophils Absolute 0.2  0.0 - 0.7 K/uL   Basophils Relative 1  0 - 1 %   Basophils Absolute 0.0  0.0 - 0.1 K/uL  COMPREHENSIVE METABOLIC PANEL      Result Value Range   Sodium 133 (*) 135 - 145 mEq/L   Potassium 4.9  3.5 - 5.1 mEq/L   Chloride 95 (*) 96 - 112 mEq/L   CO2 25  19 - 32 mEq/L   Glucose, Bld 119 (*) 70 - 99 mg/dL   BUN 34 (*) 6 - 23 mg/dL   Creatinine, Ser 7.82 (*) 0.50 - 1.35 mg/dL   Calcium 9.5  8.4 - 95.6 mg/dL   Total Protein 7.9  6.0 - 8.3 g/dL   Albumin 3.4 (*) 3.5 - 5.2 g/dL   AST 94 (*) 0 - 37 U/L   ALT 41  0 - 53 U/L   Alkaline Phosphatase 89  39 - 117 U/L   Total Bilirubin 0.1 (*) 0.3 - 1.2 mg/dL   GFR calc non Af Amer 45 (*) >90 mL/min   GFR calc Af Amer 52 (*) >90 mL/min  LIPASE, BLOOD      Result Value Range   Lipase 50  11 - 59 U/L  URINALYSIS, ROUTINE W REFLEX MICROSCOPIC      Result Value Range   Color, Urine YELLOW  YELLOW   APPearance CLEAR  CLEAR   Specific Gravity, Urine 1.015  1.005 - 1.030   pH 5.5  5.0 - 8.0   Glucose, UA NEGATIVE  NEGATIVE mg/dL   Hgb urine dipstick NEGATIVE  NEGATIVE   Bilirubin Urine NEGATIVE  NEGATIVE   Ketones, ur NEGATIVE  NEGATIVE mg/dL   Protein, ur NEGATIVE  NEGATIVE mg/dL   Urobilinogen, UA 0.2  0.0 - 1.0 mg/dL   Nitrite NEGATIVE  NEGATIVE   Leukocytes, UA TRACE (*) NEGATIVE  VALPROIC ACID LEVEL      Result Value Range   Valproic Acid Lvl 22.8 (*) 50.0 - 100.0 ug/mL  PHENYTOIN LEVEL, TOTAL      Result Value Range   Phenytoin Lvl <2.5 (*) 10.0 - 20.0 ug/mL  URINE MICROSCOPIC-ADD ON      Result Value Range   Squamous Epithelial / LPF RARE  RARE   WBC, UA 3-6  <3 WBC/hpf   Bacteria, UA RARE  RARE   Casts HYALINE CASTS (*) NEGATIVE  OCCULT BLOOD, POC DEVICE      Result Value Range   Fecal Occult Bld POSITIVE (*) NEGATIVE   Dg Chest 2 View  10/11/2012   *RADIOLOGY REPORT*  Clinical  Data: Cough, pain  CHEST - 2 VIEW  Comparison: 08/14/2012  Findings: Bronchitic changes.  No focal consolidation.  No pleural effusion or pneumothorax.  Mild cardiomegaly. Postsurgical changes related to prior CABG.  Mild degenerative changes of the visualized thoracolumbar spine.  IMPRESSION: No evidence of acute cardiopulmonary disease.   Original Report Authenticated By: Charline Bills, M.D.      MDM  Iv ns. Labs.   Reviewed nursing notes and prior charts for additional history.    Date: 10/11/2012  Rate: 51  Rhythm: sinus bradycardia  QRS Axis: normal  Intervals: normal  ST/T Wave abnormalities: normal  Conduction Disutrbances:incomplete rbbb  Narrative Interpretation:   Old EKG Reviewed: unchanged   From review recent H and P:  '#CKD stage III: Baseline Cr is 1.5- 1.6. ' Similar cr today, although mildly increased from most recent.  Iv ns bolus. Po fluids. Pt hungry, asking for meal. Meal provided.  No abd pain or tenderness. abd soft nt. Rectal no melena, stool brown, weakly heme pos, as was prior. hgb improved from prior.   On multiple rechecks during ED stay, pt alert, awake, comfortable, no pain, no faintness or dizziness.   Given mild inc bun/cr from prior, cxr clear, will dec lasix dose, and have follow up closely w pcp.   Recheck pt remains asymptomatic.  Troponin still pending - called lab- they indicate sample has to be redrawn, pts nurse informed.  Signed out to Dr Rulon Abide, if trop normal, may be d/c'd per instructions, close pcp f/u Monday, gi follow up in coming week.     Suzi Roots, MD 10/11/12 [phone]

## 2012-10-11 NOTE — ED Notes (Signed)
Warm blanket given; officer at side of pt

## 2012-10-11 NOTE — ED Notes (Signed)
Pt waiting patiently with officer at side in triage area

## 2012-10-11 NOTE — ED Notes (Signed)
Pt arrived in custody of sherriffs [facility]. Came from jail, reports not feeling well, having dizziness, fatigue and his bp was 70/46. Reports being so dizzy that he was unable to ambulate. Denies any cp or sob. bp 103/68 at triage.

## 2012-10-13 LAB — URINE CULTURE

## 2013-03-08 ENCOUNTER — Emergency Department (HOSPITAL_COMMUNITY): Payer: Medicare Other

## 2013-03-08 ENCOUNTER — Encounter (HOSPITAL_COMMUNITY): Payer: Self-pay | Admitting: Emergency Medicine

## 2013-03-08 ENCOUNTER — Inpatient Hospital Stay (HOSPITAL_COMMUNITY)
Admission: EM | Admit: 2013-03-08 | Discharge: 2013-03-13 | DRG: 917 | Disposition: A | Discharge status: Home / Self Care | Payer: Medicare Other | Attending: Internal Medicine | Admitting: Internal Medicine

## 2013-03-08 DIAGNOSIS — N183 Chronic kidney disease, stage 3 unspecified: Secondary | ICD-10-CM

## 2013-03-08 DIAGNOSIS — I5023 Acute on chronic systolic (congestive) heart failure: Secondary | ICD-10-CM

## 2013-03-08 DIAGNOSIS — I2789 Other specified pulmonary heart diseases: Secondary | ICD-10-CM | POA: Diagnosis present

## 2013-03-08 DIAGNOSIS — D649 Anemia, unspecified: Secondary | ICD-10-CM | POA: Diagnosis present

## 2013-03-08 DIAGNOSIS — R001 Bradycardia, unspecified: Secondary | ICD-10-CM

## 2013-03-08 DIAGNOSIS — Z9119 Patient's noncompliance with other medical treatment and regimen: Secondary | ICD-10-CM

## 2013-03-08 DIAGNOSIS — I509 Heart failure, unspecified: Secondary | ICD-10-CM | POA: Diagnosis present

## 2013-03-08 DIAGNOSIS — R079 Chest pain, unspecified: Secondary | ICD-10-CM

## 2013-03-08 DIAGNOSIS — I5022 Chronic systolic (congestive) heart failure: Secondary | ICD-10-CM

## 2013-03-08 DIAGNOSIS — Z7982 Long term (current) use of aspirin: Secondary | ICD-10-CM

## 2013-03-08 DIAGNOSIS — Z5900 Homelessness unspecified: Secondary | ICD-10-CM

## 2013-03-08 DIAGNOSIS — Z7251 High risk heterosexual behavior: Secondary | ICD-10-CM

## 2013-03-08 DIAGNOSIS — D509 Iron deficiency anemia, unspecified: Secondary | ICD-10-CM

## 2013-03-08 DIAGNOSIS — I200 Unstable angina: Secondary | ICD-10-CM

## 2013-03-08 DIAGNOSIS — Z91199 Patient's noncompliance with other medical treatment and regimen due to unspecified reason: Secondary | ICD-10-CM

## 2013-03-08 DIAGNOSIS — F17200 Nicotine dependence, unspecified, uncomplicated: Secondary | ICD-10-CM | POA: Diagnosis present

## 2013-03-08 DIAGNOSIS — I252 Old myocardial infarction: Secondary | ICD-10-CM

## 2013-03-08 DIAGNOSIS — M5136 Other intervertebral disc degeneration, lumbar region: Secondary | ICD-10-CM

## 2013-03-08 DIAGNOSIS — R569 Unspecified convulsions: Secondary | ICD-10-CM

## 2013-03-08 DIAGNOSIS — I701 Atherosclerosis of renal artery: Secondary | ICD-10-CM

## 2013-03-08 DIAGNOSIS — I739 Peripheral vascular disease, unspecified: Secondary | ICD-10-CM

## 2013-03-08 DIAGNOSIS — Z951 Presence of aortocoronary bypass graft: Secondary | ICD-10-CM

## 2013-03-08 DIAGNOSIS — F1910 Other psychoactive substance abuse, uncomplicated: Secondary | ICD-10-CM

## 2013-03-08 DIAGNOSIS — Z23 Encounter for immunization: Secondary | ICD-10-CM

## 2013-03-08 DIAGNOSIS — E785 Hyperlipidemia, unspecified: Secondary | ICD-10-CM | POA: Diagnosis present

## 2013-03-08 DIAGNOSIS — I255 Ischemic cardiomyopathy: Secondary | ICD-10-CM

## 2013-03-08 DIAGNOSIS — J449 Chronic obstructive pulmonary disease, unspecified: Secondary | ICD-10-CM | POA: Diagnosis present

## 2013-03-08 DIAGNOSIS — I2510 Atherosclerotic heart disease of native coronary artery without angina pectoris: Secondary | ICD-10-CM

## 2013-03-08 DIAGNOSIS — G4733 Obstructive sleep apnea (adult) (pediatric): Secondary | ICD-10-CM | POA: Diagnosis present

## 2013-03-08 DIAGNOSIS — Z79899 Other long term (current) drug therapy: Secondary | ICD-10-CM

## 2013-03-08 DIAGNOSIS — R0789 Other chest pain: Secondary | ICD-10-CM

## 2013-03-08 DIAGNOSIS — J4489 Other specified chronic obstructive pulmonary disease: Secondary | ICD-10-CM | POA: Diagnosis present

## 2013-03-08 DIAGNOSIS — K922 Gastrointestinal hemorrhage, unspecified: Secondary | ICD-10-CM

## 2013-03-08 DIAGNOSIS — I10 Essential (primary) hypertension: Secondary | ICD-10-CM

## 2013-03-08 DIAGNOSIS — Z8249 Family history of ischemic heart disease and other diseases of the circulatory system: Secondary | ICD-10-CM

## 2013-03-08 DIAGNOSIS — I129 Hypertensive chronic kidney disease with stage 1 through stage 4 chronic kidney disease, or unspecified chronic kidney disease: Secondary | ICD-10-CM | POA: Diagnosis present

## 2013-03-08 DIAGNOSIS — T43601A Poisoning by unspecified psychostimulants, accidental (unintentional), initial encounter: Secondary | ICD-10-CM | POA: Diagnosis present

## 2013-03-08 DIAGNOSIS — I214 Non-ST elevation (NSTEMI) myocardial infarction: Secondary | ICD-10-CM

## 2013-03-08 DIAGNOSIS — T405X1A Poisoning by cocaine, accidental (unintentional), initial encounter: Principal | ICD-10-CM | POA: Diagnosis present

## 2013-03-08 DIAGNOSIS — M51369 Other intervertebral disc degeneration, lumbar region without mention of lumbar back pain or lower extremity pain: Secondary | ICD-10-CM

## 2013-03-08 DIAGNOSIS — F1410 Cocaine abuse, uncomplicated: Secondary | ICD-10-CM

## 2013-03-08 DIAGNOSIS — J189 Pneumonia, unspecified organism: Secondary | ICD-10-CM | POA: Diagnosis present

## 2013-03-08 DIAGNOSIS — I272 Pulmonary hypertension, unspecified: Secondary | ICD-10-CM

## 2013-03-08 DIAGNOSIS — E119 Type 2 diabetes mellitus without complications: Secondary | ICD-10-CM

## 2013-03-08 DIAGNOSIS — I2589 Other forms of chronic ischemic heart disease: Secondary | ICD-10-CM | POA: Diagnosis present

## 2013-03-08 DIAGNOSIS — G40909 Epilepsy, unspecified, not intractable, without status epilepticus: Secondary | ICD-10-CM

## 2013-03-08 LAB — GLUCOSE, CAPILLARY: Glucose-Capillary: 86 mg/dL (ref 70–99)

## 2013-03-08 LAB — BASIC METABOLIC PANEL
BUN: 17 mg/dL (ref 6–23)
CO2: 17 mEq/L — ABNORMAL LOW (ref 19–32)
Chloride: 104 mEq/L (ref 96–112)
Creatinine, Ser: 1.08 mg/dL (ref 0.50–1.35)
Potassium: 3.4 mEq/L — ABNORMAL LOW (ref 3.5–5.1)

## 2013-03-08 LAB — URINALYSIS, ROUTINE W REFLEX MICROSCOPIC
Bilirubin Urine: NEGATIVE
Glucose, UA: NEGATIVE mg/dL
Ketones, ur: NEGATIVE mg/dL
Leukocytes, UA: NEGATIVE
Nitrite: NEGATIVE
Protein, ur: >300 mg/dL — AB

## 2013-03-08 LAB — HEPATIC FUNCTION PANEL
ALT: 17 U/L (ref 0–53)
AST: 32 U/L (ref 0–37)
Albumin: 3.2 g/dL — ABNORMAL LOW (ref 3.5–5.2)
Alkaline Phosphatase: 69 U/L (ref 39–117)
Total Protein: 7.2 g/dL (ref 6.0–8.3)

## 2013-03-08 LAB — ETHANOL: Alcohol, Ethyl (B): <11 mg/dL (ref 0–11)

## 2013-03-08 LAB — CBC
HCT: 26.5 % — ABNORMAL LOW (ref 39.0–52.0)
MCV: 93.6 fL (ref 78.0–100.0)
Platelets: 241 10*3/uL (ref 150–400)
RBC: 2.83 MIL/uL — ABNORMAL LOW (ref 4.22–5.81)
WBC: 5.3 10*3/uL (ref 4.0–10.5)

## 2013-03-08 LAB — RAPID URINE DRUG SCREEN, HOSP PERFORMED
Amphetamines: NOT DETECTED
Benzodiazepines: NOT DETECTED
Cocaine: POSITIVE — AB

## 2013-03-08 LAB — URINE MICROSCOPIC-ADD ON

## 2013-03-08 LAB — POCT I-STAT TROPONIN I

## 2013-03-08 LAB — PRO B NATRIURETIC PEPTIDE: Pro B Natriuretic peptide (BNP): 9784 pg/mL — ABNORMAL HIGH (ref 0–125)

## 2013-03-08 LAB — VALPROIC ACID LEVEL: Valproic Acid Lvl: <10 ug/mL — ABNORMAL LOW (ref 50.0–100.0)

## 2013-03-08 LAB — TROPONIN I: Troponin I: <0.3 ng/mL (ref <0.30)

## 2013-03-08 MED ORDER — SIMVASTATIN 40 MG PO TABS
40.0000 mg | ORAL_TABLET | Freq: Every evening | ORAL | Status: DC
  Filled 2013-03-08: qty 1

## 2013-03-08 MED ORDER — LISINOPRIL 20 MG PO TABS
20.0000 mg | ORAL_TABLET | Freq: Two times a day (BID) | ORAL | Status: DC
  Administered 2013-03-09: 20 mg via ORAL
  Filled 2013-03-08 (×3): qty 1

## 2013-03-08 MED ORDER — HYDROMORPHONE HCL PF 1 MG/ML IJ SOLN
1.0000 mg | Freq: Once | INTRAMUSCULAR | Status: AC
  Administered 2013-03-08: 1 mg via INTRAVENOUS
  Filled 2013-03-08: qty 1

## 2013-03-08 MED ORDER — HYDRALAZINE HCL 50 MG PO TABS
100.0000 mg | ORAL_TABLET | Freq: Three times a day (TID) | ORAL | Status: DC
  Administered 2013-03-09 – 2013-03-11 (×8): 100 mg via ORAL
  Filled 2013-03-08 (×11): qty 2

## 2013-03-08 MED ORDER — PANTOPRAZOLE SODIUM 40 MG PO TBEC
40.0000 mg | DELAYED_RELEASE_TABLET | Freq: Two times a day (BID) | ORAL | Status: DC
  Administered 2013-03-09 – 2013-03-12 (×9): 40 mg via ORAL
  Filled 2013-03-08 (×9): qty 1

## 2013-03-08 MED ORDER — SPIRONOLACTONE 50 MG PO TABS
50.0000 mg | ORAL_TABLET | Freq: Every day | ORAL | Status: DC
  Administered 2013-03-09 – 2013-03-12 (×4): 50 mg via ORAL
  Filled 2013-03-08 (×5): qty 1

## 2013-03-08 MED ORDER — DIAZEPAM 5 MG PO TABS
5.0000 mg | ORAL_TABLET | Freq: Four times a day (QID) | ORAL | Status: DC | PRN
  Administered 2013-03-10: 5 mg via ORAL
  Filled 2013-03-08: qty 1

## 2013-03-08 MED ORDER — LORAZEPAM 2 MG/ML IJ SOLN
1.0000 mg | Freq: Once | INTRAMUSCULAR | Status: AC
  Administered 2013-03-08: 1 mg via INTRAVENOUS

## 2013-03-08 MED ORDER — CARVEDILOL 12.5 MG PO TABS
12.5000 mg | ORAL_TABLET | Freq: Two times a day (BID) | ORAL | Status: DC
  Filled 2013-03-08 (×3): qty 1

## 2013-03-08 MED ORDER — LORAZEPAM 2 MG/ML IJ SOLN
INTRAMUSCULAR | Status: AC
  Filled 2013-03-08: qty 1

## 2013-03-08 MED ORDER — SODIUM CHLORIDE 0.9 % IV SOLN
1000.0000 mg | Freq: Once | INTRAVENOUS | Status: AC
  Administered 2013-03-08: 1000 mg via INTRAVENOUS
  Filled 2013-03-08: qty 20

## 2013-03-08 MED ORDER — IOHEXOL 350 MG/ML SOLN
100.0000 mL | Freq: Once | INTRAVENOUS | Status: AC | PRN
  Administered 2013-03-08: 100 mL via INTRAVENOUS

## 2013-03-08 MED ORDER — SODIUM CHLORIDE 0.9 % IV SOLN
INTRAVENOUS | Status: DC
  Administered 2013-03-08: 19:00:00 via INTRAVENOUS

## 2013-03-08 MED ORDER — HYDROMORPHONE HCL PF 1 MG/ML IJ SOLN
1.0000 mg | INTRAMUSCULAR | Status: DC | PRN
  Administered 2013-03-09: 1 mg via INTRAVENOUS
  Filled 2013-03-08: qty 1

## 2013-03-08 MED ORDER — DIVALPROEX SODIUM 500 MG PO DR TAB
500.0000 mg | DELAYED_RELEASE_TABLET | Freq: Two times a day (BID) | ORAL | Status: DC
  Administered 2013-03-09 – 2013-03-12 (×9): 500 mg via ORAL
  Filled 2013-03-08 (×11): qty 1

## 2013-03-08 MED ORDER — NITROGLYCERIN 2 % TD OINT
1.0000 [in_us] | TOPICAL_OINTMENT | Freq: Four times a day (QID) | TRANSDERMAL | Status: DC
  Administered 2013-03-09 – 2013-03-11 (×10): 1 [in_us] via TOPICAL
  Filled 2013-03-08: qty 1
  Filled 2013-03-08: qty 30

## 2013-03-08 MED ORDER — ONDANSETRON HCL 4 MG/2ML IJ SOLN
4.0000 mg | Freq: Once | INTRAMUSCULAR | Status: AC
  Administered 2013-03-08: 4 mg via INTRAVENOUS
  Filled 2013-03-08: qty 2

## 2013-03-08 MED ORDER — ONDANSETRON HCL 4 MG/2ML IJ SOLN: 4.0000 mg | Freq: Three times a day (TID) | INTRAMUSCULAR | Status: DC | PRN

## 2013-03-08 MED ORDER — FERROUS SULFATE 325 (65 FE) MG PO TABS
325.0000 mg | ORAL_TABLET | Freq: Two times a day (BID) | ORAL | Status: DC
  Administered 2013-03-09 – 2013-03-12 (×7): 325 mg via ORAL
  Filled 2013-03-08 (×12): qty 1

## 2013-03-08 MED ORDER — POTASSIUM CHLORIDE ER 10 MEQ PO TBCR
10.0000 meq | EXTENDED_RELEASE_TABLET | Freq: Every day | ORAL | Status: DC
  Filled 2013-03-08: qty 1

## 2013-03-08 MED ORDER — FUROSEMIDE 40 MG PO TABS
40.0000 mg | ORAL_TABLET | Freq: Every day | ORAL | Status: DC
  Filled 2013-03-08: qty 1

## 2013-03-08 MED ORDER — SODIUM CHLORIDE 0.9 % IV SOLN: INTRAVENOUS | Status: DC

## 2013-03-08 NOTE — ED Notes (Signed)
Pt c/o left sided cp that radiates to left arm with n/v. Pt rates pain 9/10 describes it as sharp. Pt states he has not had any of his medications since December the 8th.

## 2013-03-08 NOTE — ED Notes (Signed)
Received pt via EMS with c/o Left sided chest pain and shortness of breath onset 0700. Pt called ems while out at [employer] for generalized weakness. Pt reports history of back problems that causes problems with his legs. Pt given 324mg  of ASA, 1 nitro and 4mg  of zofran by EMS. Pt ran out of all his prescribed medication 02/24/2013. Pt has history of seizures, chf, and copd.

## 2013-03-08 NOTE — ED Notes (Signed)
Pt unable to provide urine sample at this time 

## 2013-03-09 ENCOUNTER — Encounter (HOSPITAL_COMMUNITY): Payer: Self-pay

## 2013-03-09 DIAGNOSIS — I10 Essential (primary) hypertension: Secondary | ICD-10-CM

## 2013-03-09 DIAGNOSIS — I5023 Acute on chronic systolic (congestive) heart failure: Secondary | ICD-10-CM

## 2013-03-09 DIAGNOSIS — I214 Non-ST elevation (NSTEMI) myocardial infarction: Secondary | ICD-10-CM

## 2013-03-09 DIAGNOSIS — I5022 Chronic systolic (congestive) heart failure: Secondary | ICD-10-CM

## 2013-03-09 DIAGNOSIS — R569 Unspecified convulsions: Secondary | ICD-10-CM

## 2013-03-09 DIAGNOSIS — R079 Chest pain, unspecified: Secondary | ICD-10-CM

## 2013-03-09 DIAGNOSIS — I2510 Atherosclerotic heart disease of native coronary artery without angina pectoris: Secondary | ICD-10-CM

## 2013-03-09 DIAGNOSIS — N183 Chronic kidney disease, stage 3 unspecified: Secondary | ICD-10-CM

## 2013-03-09 DIAGNOSIS — E119 Type 2 diabetes mellitus without complications: Secondary | ICD-10-CM

## 2013-03-09 DIAGNOSIS — F1410 Cocaine abuse, uncomplicated: Secondary | ICD-10-CM

## 2013-03-09 DIAGNOSIS — I509 Heart failure, unspecified: Secondary | ICD-10-CM

## 2013-03-09 LAB — BASIC METABOLIC PANEL
BUN: 18 mg/dL (ref 6–23)
CO2: 15 mEq/L — ABNORMAL LOW (ref 19–32)
Chloride: 108 mEq/L (ref 96–112)
Creatinine, Ser: 1.14 mg/dL (ref 0.50–1.35)
GFR calc Af Amer: 74 mL/min — ABNORMAL LOW (ref >90)
Glucose, Bld: 91 mg/dL (ref 70–99)
Potassium: 4.5 mEq/L (ref 3.5–5.1)

## 2013-03-09 LAB — CBC
HCT: 25.3 % — ABNORMAL LOW (ref 39.0–52.0)
Hemoglobin: 8.6 g/dL — ABNORMAL LOW (ref 13.0–17.0)
MCH: 31.5 pg (ref 26.0–34.0)
MCHC: 34 g/dL (ref 30.0–36.0)
MCV: 92.7 fL (ref 78.0–100.0)
RBC: 2.73 MIL/uL — ABNORMAL LOW (ref 4.22–5.81)
RDW: 15.5 % (ref 11.5–15.5)

## 2013-03-09 LAB — GLUCOSE, CAPILLARY: Glucose-Capillary: 100 mg/dL — ABNORMAL HIGH (ref 70–99)

## 2013-03-09 LAB — INFLUENZA PANEL BY PCR (TYPE A & B)
Influenza A By PCR: NEGATIVE
Influenza B By PCR: NEGATIVE

## 2013-03-09 MED ORDER — CHLORHEXIDINE GLUCONATE CLOTH 2 % EX PADS
6.0000 | MEDICATED_PAD | Freq: Every day | CUTANEOUS | Status: DC
  Administered 2013-03-10 – 2013-03-11 (×2): 6 via TOPICAL

## 2013-03-09 MED ORDER — CLONIDINE HCL 0.1 MG PO TABS
0.1000 mg | ORAL_TABLET | Freq: Two times a day (BID) | ORAL | Status: DC
  Administered 2013-03-09 – 2013-03-10 (×4): 0.1 mg via ORAL
  Filled 2013-03-09 (×6): qty 1

## 2013-03-09 MED ORDER — AMLODIPINE BESYLATE 2.5 MG PO TABS
2.5000 mg | ORAL_TABLET | Freq: Every day | ORAL | Status: DC
  Filled 2013-03-09: qty 1

## 2013-03-09 MED ORDER — ALUM & MAG HYDROXIDE-SIMETH 200-200-20 MG/5ML PO SUSP: 30.0000 mL | Freq: Four times a day (QID) | ORAL | Status: DC | PRN

## 2013-03-09 MED ORDER — FUROSEMIDE 10 MG/ML IJ SOLN
40.0000 mg | Freq: Two times a day (BID) | INTRAMUSCULAR | Status: DC
  Administered 2013-03-09 (×2): 40 mg via INTRAVENOUS
  Filled 2013-03-09 (×4): qty 4

## 2013-03-09 MED ORDER — ALBUTEROL SULFATE (5 MG/ML) 0.5% IN NEBU: 2.5000 mg | INHALATION_SOLUTION | RESPIRATORY_TRACT | Status: DC | PRN

## 2013-03-09 MED ORDER — LEVOFLOXACIN IN D5W 750 MG/150ML IV SOLN
750.0000 mg | INTRAVENOUS | Status: DC
  Administered 2013-03-09 – 2013-03-10 (×2): 750 mg via INTRAVENOUS
  Filled 2013-03-09 (×2): qty 150

## 2013-03-09 MED ORDER — SODIUM CHLORIDE 0.9 % IV SOLN
INTRAVENOUS | Status: DC
  Administered 2013-03-09 (×2): via INTRAVENOUS

## 2013-03-09 MED ORDER — ACETAMINOPHEN 650 MG RE SUPP: 650.0000 mg | Freq: Four times a day (QID) | RECTAL | Status: DC | PRN

## 2013-03-09 MED ORDER — HEPARIN (PORCINE) IN NACL 100-0.45 UNIT/ML-% IJ SOLN
1150.0000 [IU]/h | INTRAMUSCULAR | Status: DC
  Administered 2013-03-09 – 2013-03-11 (×3): 1150 [IU]/h via INTRAVENOUS
  Filled 2013-03-09 (×4): qty 250

## 2013-03-09 MED ORDER — DILTIAZEM HCL ER COATED BEADS 120 MG PO CP24
120.0000 mg | ORAL_CAPSULE | Freq: Every day | ORAL | Status: DC
  Administered 2013-03-09 – 2013-03-12 (×4): 120 mg via ORAL
  Filled 2013-03-09 (×5): qty 1

## 2013-03-09 MED ORDER — LORAZEPAM 2 MG/ML IJ SOLN: 1.0000 mg | INTRAMUSCULAR | Status: DC | PRN

## 2013-03-09 MED ORDER — ONDANSETRON HCL 4 MG/2ML IJ SOLN: 4.0000 mg | Freq: Four times a day (QID) | INTRAMUSCULAR | Status: DC | PRN

## 2013-03-09 MED ORDER — ACETAMINOPHEN 325 MG PO TABS: 650.0000 mg | ORAL_TABLET | Freq: Four times a day (QID) | ORAL | Status: DC | PRN

## 2013-03-09 MED ORDER — MUPIROCIN 2 % EX OINT
1.0000 "application " | TOPICAL_OINTMENT | Freq: Two times a day (BID) | CUTANEOUS | Status: DC
  Administered 2013-03-09 – 2013-03-12 (×7): 1 via NASAL
  Filled 2013-03-09: qty 22

## 2013-03-09 MED ORDER — CLONIDINE HCL 0.2 MG PO TABS
0.2000 mg | ORAL_TABLET | Freq: Every day | ORAL | Status: DC
  Filled 2013-03-09: qty 1

## 2013-03-09 MED ORDER — SODIUM CHLORIDE 0.9 % IV SOLN: INTRAVENOUS | Status: DC

## 2013-03-09 MED ORDER — ASPIRIN 325 MG PO TABS
325.0000 mg | ORAL_TABLET | Freq: Every day | ORAL | Status: DC
  Administered 2013-03-09 – 2013-03-12 (×4): 325 mg via ORAL
  Filled 2013-03-09 (×5): qty 1

## 2013-03-09 MED ORDER — HYDRALAZINE HCL 20 MG/ML IJ SOLN: 10.0000 mg | Freq: Four times a day (QID) | INTRAMUSCULAR | Status: DC | PRN

## 2013-03-09 MED ORDER — HEPARIN BOLUS VIA INFUSION
4000.0000 [IU] | Freq: Once | INTRAVENOUS | Status: AC
  Administered 2013-03-09: 4000 [IU] via INTRAVENOUS
  Filled 2013-03-09: qty 4000

## 2013-03-09 MED ORDER — ATORVASTATIN CALCIUM 20 MG PO TABS
20.0000 mg | ORAL_TABLET | Freq: Every day | ORAL | Status: DC
  Administered 2013-03-09 – 2013-03-12 (×4): 20 mg via ORAL
  Filled 2013-03-09 (×5): qty 1

## 2013-03-09 MED ORDER — PHENYTOIN SODIUM 50 MG/ML IJ SOLN
100.0000 mg | Freq: Three times a day (TID) | INTRAMUSCULAR | Status: DC
Start: 2013-03-09 — End: 2013-03-11
  Administered 2013-03-09 – 2013-03-11 (×7): 100 mg via INTRAVENOUS
  Filled 2013-03-09 (×13): qty 2

## 2013-03-09 MED ORDER — OXYCODONE HCL 5 MG PO TABS
5.0000 mg | ORAL_TABLET | ORAL | Status: DC | PRN
  Administered 2013-03-11: 5 mg via ORAL
  Filled 2013-03-09 (×2): qty 1

## 2013-03-09 MED ORDER — ENOXAPARIN SODIUM 40 MG/0.4ML ~~LOC~~ SOLN
40.0000 mg | SUBCUTANEOUS | Status: DC
  Filled 2013-03-09: qty 0.4

## 2013-03-09 MED ORDER — ONDANSETRON HCL 4 MG PO TABS: 4.0000 mg | ORAL_TABLET | Freq: Four times a day (QID) | ORAL | Status: DC | PRN

## 2013-03-09 MED ORDER — HYDROMORPHONE HCL PF 1 MG/ML IJ SOLN
0.5000 mg | INTRAMUSCULAR | Status: DC | PRN
  Administered 2013-03-09 – 2013-03-11 (×2): 1 mg via INTRAVENOUS
  Filled 2013-03-09 (×2): qty 1

## 2013-03-09 MED ORDER — ZOLPIDEM TARTRATE 5 MG PO TABS
5.0000 mg | ORAL_TABLET | Freq: Every evening | ORAL | Status: DC | PRN
  Administered 2013-03-10: 5 mg via ORAL
  Filled 2013-03-09: qty 1

## 2013-03-09 MED ORDER — POTASSIUM CHLORIDE CRYS ER 20 MEQ PO TBCR
20.0000 meq | EXTENDED_RELEASE_TABLET | Freq: Two times a day (BID) | ORAL | Status: DC
  Administered 2013-03-09 (×2): 20 meq via ORAL
  Filled 2013-03-09 (×4): qty 1

## 2013-03-09 NOTE — Progress Notes (Signed)
Notified Md about pt's temp 101.8.  Pt's room is very warm but pt comfortable.  Pt not complaining with any discomfort.   Will recheck pts temp. In one hour and notify Md if worse.  Will continue to monitor. Karena Addison T

## 2013-03-09 NOTE — Progress Notes (Signed)
Utilization Review Completed.Rondrick Barreira T12/21/2014  

## 2013-03-09 NOTE — Consult Note (Signed)
CARDIOLOGY CONSULT NOTE   Patient ID: Charles [name] MRN: [id number], DOB/AGE: 69/04/1945   Admit date: 03/08/2013 Date of Consult: 03/09/2013   Primary Physician: Bernerd Limbo Primary Cardiologist: Dr Deno Etienne in [facility]  Pt. Profile  NSTEMI cocaine induced  Problem List  Past Medical History  Diagnosis Date  . Iron deficiency anemia   . Hypertension   . Chronic systolic heart failure     2D Echo (03/2010) - EF 35-40% with akinesis of inferoposterior myocardium  . CAD (coronary artery disease)     s/p 3-vessel CABG (12/2007) // 100% RCA stenosis with collaterals from left system. Severe bifurcation lesions of proxima CXA and OM. Moderate LAD diseaseFollowed by Dr. [facility] in [facility]  . Hyperlipidemia LDL goal <70   . Obstructive sleep apnea     not on home CPAP  . Ischemic cardiomyopathy   . Chronic kidney disease (CKD), stage III (moderate)     BL SCr 1.5-1.6  . Homelessness   . 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 // Colonoscopy and EGD in 03/2010 negative per report (record not found)  . 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 Polysubstance abuse     cocaine, THC  . AV malformation of gastrointestinal tract   . Family history of early CAD   . DDD (degenerative disc disease), lumbar   . Renal artery stenosis   . Peripheral vascular disease   . Seizure disorder   . Diabetes   . Acute on chronic systolic CHF (congestive heart failure) 08/13/2012  . Myocardial infarction   . Seizures     Past Surgical History  Procedure Laterality Date  . Coronary artery bypass graft  12/2007  . Apc  03/2010    To treat small bowel AVMs  . Esophagogastroduodenoscopy N/A 08/14/2012    Procedure: ESOPHAGOGASTRODUODENOSCOPY (EGD);  Surgeon: Charna Elizabeth, MD;  Location: [facility] ENDOSCOPY;  Service: Endoscopy;  Laterality: N/A;   . Hot hemostasis N/A 08/14/2012    Procedure: HOT HEMOSTASIS (ARGON PLASMA COAGULATION/BICAP);  Surgeon: Charna Elizabeth, MD;  Location: [facility] ENDOSCOPY;  Service: Endoscopy;  Laterality: N/A;     Allergies  Allergies  Allergen Reactions  . Motrin [Ibuprofen] Hives and Nausea And Vomiting  . Tylenol [Acetaminophen] Hives and Nausea And Vomiting   HPI   Charles [name] is a 67 y.o. male with Multiple Medical problems including CAD S/P CABG in 2009 (s/p 3-vessel CABG (12/2007) // 100% RCA stenosis with collaterals from left system. Severe bifurcation lesions of proxima CXA and OM. Moderate LAD diseaseFollowed by Dr. [facility]ona Leavens in [facility]), CHF, LVEF 35-40%,who presents to the ED with complaints of intermittent chest pain since 7 AM. He reports that the pain was a 9/10 and in the mid -chest area. He reports having SOB and DOE and weakness when he was walking so he called EMS. When he was seen in the ED, he suffered witnessed seizure and was medicated IV Lorazepam and his laboratory studies returned with sub-therapeutic levels of Dilantin so then he was administered an IV loading dose of Dilantin. Patient reports being out of his medications since 12/08 when his medicaid ran out. His initial cardiac workup was negative and he was referred for medical admission.  He admitts to taking cocaine yesterday. He also complains of fevers and chills, and coughing.  Inpatient Medications  . aspirin  325 mg Oral Daily  . atorvastatin  20 mg Oral q1800  . cloNIDine  0.1 mg Oral BID  . diltiazem  120 mg Oral Daily  . divalproex  500 mg Oral BID  . ferrous sulfate  325 mg Oral BID WC  . furosemide  40 mg Intravenous Q12H  . hydrALAZINE  100 mg Oral Q8H  . levofloxacin (LEVAQUIN) IV  750 mg Intravenous Q24H  . nitroGLYCERIN  1 inch Topical Q6H  . pantoprazole  40 mg Oral BID  . phenytoin (DILANTIN) IV  100 mg Intravenous Q8H  . potassium chloride  20 mEq Oral BID  . spironolactone  50 mg Oral Daily   Family  History  Family History  Problem Relation Age of Onset  . Heart disease Mother     unknown type  . Heart disease Father 44    died of MI at 22yo  . Heart disease Paternal Grandfather 10    died of MI  . Heart disease    . Heart disease Brother 42     Social History  History   Social History  . Marital Status: Single    Spouse Name: N/A    Number of Children: 2  . Years of Education: N/A   Occupational History  . Unemployed     previously worked in [employer]  .     Social History Main Topics  . Smoking status: Current Every Day Smoker -- 0.20 packs/day for 13 years    Types: Cigarettes    Last Attempt to Quit: 03/20/1997  . Smokeless tobacco: Never Used  . Alcohol Use: Yes     Comment: occasionally drinks, a few times a month  . Drug Use: Yes    Special: Cocaine, Marijuana  . Sexual Activity: No   Other Topics Concern  . Not on file   Social History Narrative   Lives in [facility]. Originally from [place], has been in the [place] since 1980s                    Review of Systems  General:  No chills, fever, night sweats or weight changes.  Cardiovascular:  No chest pain, dyspnea on exertion, edema, orthopnea, palpitations, paroxysmal nocturnal dyspnea. Dermatological: No rash, lesions/masses Respiratory: No cough, dyspnea Urologic: No hematuria, dysuria Abdominal:   No nausea, vomiting, diarrhea, bright red blood per rectum, melena, or hematemesis Neurologic:  No visual changes, wkns, changes in mental status. All other systems reviewed and are otherwise negative except as noted above.  Physical Exam  Blood pressure 133/70, pulse 98, temperature 100.5 F (38.1 C), temperature source Oral, resp. rate 33, height 5\' 5"  (1.651 m), weight 165 lb (74.844 kg), SpO2 91.00%.  General: Pleasant, NAD Psych: Normal affect. Neuro: Alert and oriented X 3. Moves all extremities spontaneously. HEENT: Normal  Neck: Supple without bruits or JVD. Lungs:   Resp regular and unlabored, crackles B/L Heart: RRR no s3, s4, or murmurs. Abdomen: Soft, non-tender, non-distended, BS + x 4.  Extremities: No clubbing, cyanosis or edema. DP/PT/Radials 2+ and equal bilaterally.  Labs  Recent Labs  03/08/13 2315 03/09/13 1330  TROPONINI <0.30 0.54*   Lab Results  Component Value Date   WBC 5.0 03/09/2013   HGB 8.6* 03/09/2013   HCT 25.3* 03/09/2013   MCV 92.7 03/09/2013   PLT 242 03/09/2013    Recent Labs Lab 03/08/13 1853 03/09/13 0431  NA  --  137  K  --  4.5  CL  --  108  CO2  --  15*  BUN  --  18  CREATININE  --  1.14  CALCIUM  --  8.0*  PROT 7.2  --   BILITOT 0.3  --   ALKPHOS 69  --   ALT 17  --   AST 32  --   GLUCOSE  --  91   Lab Results  Component Value Date   CHOL 108 06/14/2012   HDL 43 06/14/2012   LDLCALC 48 06/14/2012   TRIG 85 06/14/2012   No results found for this basename: DDIMER   No components found with this basename: POCBNP,   Radiology/Studies  Dg Chest 2 View (if Patient Has Fever And/or Copd)  03/08/2013    IMPRESSION: 1. New left lower lobe subsegmental atelectasis versus early infiltrate. 2. Stable mild cardiomegaly.     Ct Head Wo Contrast  03/08/2013   IMPRESSION: No evidence of acute intracranial abnormality.  Small atrophy and chronic small-vessel white matter ischemic changes.   Electronically Signed   By: Laveda Abbe M.D.   On: 03/08/2013 20:40   Ct Angio Chest Pe W/cm &/or Wo Cm  03/08/2013   CLINICAL DATA:  IMPRESSION: No evidence of pulmonary emboli.  Trace pleural effusions with mild bibasilar atelectasis/ scarring.  Mild interlobular septal thickening bilaterally of uncertain chronicity. Mild interstitial edema is not excluded.  Cardiomegaly and ectatic thoracic aorta.  Small hiatal hernia.   Electronically Signed   By: Laveda Abbe M.D.   On: 03/08/2013 23:15   ECG: Sinus tachycardia, old inf MI, non-specific ST-T wave abnormalities, unchanged from the prior ECG    ASSESSMENT AND  PLAN  67 year old male with known CAD, prior CABG in 2009  1. NSTEMI , Cocaine induced, ECH unchanged, TnI 0.30--> 0.54. We will follow further enzyme trend. However, considering his active infection we would hold on cath now. - continue heparin, aspirin, statin, ok with holding BB  2. Acute on chronic systolic CHF - LVEF 35-40%, continue Lasix 40 iv BID  3. HTN - controlled, continue current regimen  4. Cocaine Abuse- +UDS, Counseled  5. CKD stage III  6. Pneumonia - on iv ATB  7. Seizures- HX , and due to sub-therapeutic Dilantin levels. RE-Loaded with IV Dilantin in ED.   Signed, Lars Masson, MD, [facility] 03/09/2013, 3:44 PM

## 2013-03-09 NOTE — Progress Notes (Signed)
CRITICAL VALUE ALERT  Critical value received:  Trop 0.54 Date of notification:  03-09-2013  Time of notification:  1500  Critical value read back:yes  Nurse who received alert:  Kenney Houseman RN  MD notified (1st page):  Dr. York Spaniel  Time of first page:  1500  MD notified (2nd page):  Time of second page:  Responding MD:  Dr. York Spaniel  Time MD responded:  1500

## 2013-03-09 NOTE — Progress Notes (Addendum)
[facility] PROGRESS NOTE  Charles [name] HYQ:[id number] DOB: 04-Feb-1946 DOA: 03/08/2013 PCP: Bernerd Limbo  Assessment/Plan: 67 y/o male with PMH of HTN, HPL, seizure, CAD s/p CABG, CHF, CKD, polysubstance abuse, medication non compliance presented with complaints of intermittent chest pain since 7 AM. He reports that the pain was a 9/10 and in the mid -chest area. He reports having SOB and DOE and weakness when he was walking so he called EMS. When he was seen in the ED, he suffered witnessed seizure and was medicated IV Lorazepam and his laboratory studies returned with sub-therapeutic levels of Dilantin so then he was administered an IV loading dose of Dilantin. Patient reports being out of his medications since 12/08   1. NSTEMI ? Cocaine induced; initial trop neg; but then 0.54 -hold BB; cont ASA, NG, IV hep; start Cardizem for HR control; monitor ECG, trop; c/s cardiology   2. Acute on chronic CHF with pulmonary HTN RSVP 76; CXR: cardiomegaly, congestion; patient was not taking meds including lasix;  -echo (03/2010): LVEF 35%, dilated LV, LA, RV; RSVP 76 -d/c IVF; start lasix IV, replace lytes; daily I/O, daily weight;   3. CAD s/p CABG; as above   4. COPD with probable pneumonia LLL; productive cough, fever, chills;  -start empiric IV atx, bronchodilators;   5. HTN not at goal;  -resume home meds, hold ACE with IV diuresis;  titrate per response   6. Seizures- HX , and due to sub-therapeutic Dilantin levels. RE-Loaded with IV Dilantin in ED. Continue Depakote Rx.  Seizure Precautions, and PRN IV Ativan for Seizure Activity.   7. Cocaine Abuse- +UDS, Counseled, PRN IV Ativan for agitation or signs of Withdrawal  -Other - may need assistance through Social work to get his medications.  Code Status: full Family Communication: d/w patient  (indicate person spoken with, relationship, and if by phone, the number) Disposition Plan: pend clinical improvement;     Consultants:  Cardiology   Procedures:  None   Antibiotics:  Levofloxacin  (indicate start date, and stop date if known)  HPI/Subjective: alert  Objective: Filed Vitals:   03/09/13 0800  BP: 165/84  Pulse: 106  Temp: 100 F (37.8 C)  Resp: 32    Intake/Output Summary (Last 24 hours) at 03/09/13 0815 Last data filed at 03/09/13 0800  Gross per 24 hour  Intake 708.75 ml  Output      0 ml  Net 708.75 ml   Filed Weights   03/08/13 1740  Weight: 74.844 kg (165 lb)    Exam:   General:  alert  Cardiovascular: s1,s2 tachycardia   Respiratory: LL crackles   Abdomen: soft, nt, nd   Musculoskeletal: no LE edema    Data Reviewed: Basic Metabolic Panel:  Recent Labs Lab 03/08/13 1743 03/09/13 0431  NA 138 137  K 3.4* 4.5  CL 104 108  CO2 17* 15*  GLUCOSE 85 91  BUN 17 18  CREATININE 1.08 1.14  CALCIUM 8.9 8.0*   Liver Function Tests:  Recent Labs Lab 03/08/13 1853  AST 32  ALT 17  ALKPHOS 69  BILITOT 0.3  PROT 7.2  ALBUMIN 3.2*   No results found for this basename: LIPASE, AMYLASE,  in the last 168 hours No results found for this basename: AMMONIA,  in the last 168 hours CBC:  Recent Labs Lab 03/08/13 1743 03/09/13 0431  WBC 5.3 5.0  HGB 9.1* 8.6*  HCT 26.5* 25.3*  MCV 93.6 92.7  PLT 241 242   Cardiac  Enzymes:  Recent Labs Lab 03/08/13 2315  TROPONINI <0.30   BNP (last 3 results)  Recent Labs  08/06/12 0359 08/13/12 0720 03/08/13 1747  PROBNP 807.8* 862.5* 9784.0*   CBG:  Recent Labs Lab 03/08/13 1849 03/09/13 0605  GLUCAP 86 100*    Recent Results (from the past 240 hour(s))  MRSA PCR SCREENING     Status: Abnormal   Collection Time    03/09/13  1:14 AM      Result Value Range Status   MRSA by PCR POSITIVE (*) NEGATIVE Final   Comment:            The GeneXpert MRSA Assay (FDA     approved for NASAL specimens     only), is one component of a     comprehensive MRSA colonization     surveillance  program. It is not     intended to diagnose MRSA     infection nor to guide or     monitor treatment for     MRSA infections.     RESULT CALLED TO, READ BACK BY AND VERIFIED WITH:     YOCUM,R RN [id number] AT 0343 SKEEN,P     Studies: Dg Chest 2 View (if Patient Has Fever And/or Copd)  03/08/2013   CLINICAL DATA:  Chest pain, shortness of Breath  EXAM: CHEST - 2 VIEW  COMPARISON:  10/11/2012  FINDINGS: Previous CABG. Stable mild cardiomegaly. Patient is rotated towards the left. There is a mild dextroscoliosis of the thoracic spine as before. Central pulmonary vascular congestion. There is some patchy subsegmental atelectasis versus early infiltrate at the left lung base. No definite effusion.  IMPRESSION: 1. New left lower lobe subsegmental atelectasis versus early infiltrate. 2. Stable mild cardiomegaly.   Electronically Signed   By: Oley Balm M.D.   On: 03/08/2013 20:46   Ct Head Wo Contrast  03/08/2013   CLINICAL DATA:  67 year old male with seizure.  EXAM: CT HEAD WITHOUT CONTRAST  TECHNIQUE: Contiguous axial images were obtained from the base of the skull through the vertex without intravenous contrast.  COMPARISON:  03/27/2010 CT  FINDINGS: Atrophy, mild chronic small-vessel white matter ischemic changes left frontal encephalomalacia again noted.  No acute intracranial abnormalities are identified, including mass lesion or mass effect, hydrocephalus, extra-axial fluid collection, midline shift, hemorrhage, or acute infarction. The visualized bony calvarium is unremarkable.  IMPRESSION: No evidence of acute intracranial abnormality.  Small atrophy and chronic small-vessel white matter ischemic changes.   Electronically Signed   By: Laveda Abbe M.D.   On: 03/08/2013 20:40   Ct Angio Chest Pe W/cm &/or Wo Cm  03/08/2013   CLINICAL DATA:  67 year old male with chest pain and shortness of breath.  EXAM: CT ANGIOGRAPHY CHEST WITH CONTRAST  TECHNIQUE: Multidetector CT imaging of the chest was  performed using the standard protocol during bolus administration of intravenous contrast. Multiplanar CT image reconstructions including MIPs were obtained to evaluate the vascular anatomy.  CONTRAST:  OMNIPAQUE IOHEXOL 350 MG/ML SOLN  COMPARISON:  02/2019 and prior chest radiographs  FINDINGS: This is a technically satisfactory study.  No pulmonary emboli are identified.  Ectasia of the thoracic aorta is noted without aneurysm.  Cardiomegaly and cardiac surgical changes are identified.  Trace pleural effusions are present.  No enlarged lymph nodes are identified.  Mild bibasilar atelectasis/scarring noted.  Mild interlobular septal thickening bilaterally is present.  There is no evidence of consolidation, airspace disease or pulmonary nodule/mass.  No acute upper abdominal  abnormalities are noted.  A small hiatal hernia is present.  No acute or suspicious bony abnormalities are present.  Review of the MIP images confirms the above findings.  IMPRESSION: No evidence of pulmonary emboli.  Trace pleural effusions with mild bibasilar atelectasis/ scarring.  Mild interlobular septal thickening bilaterally of uncertain chronicity. Mild interstitial edema is not excluded.  Cardiomegaly and ectatic thoracic aorta.  Small hiatal hernia.   Electronically Signed   By: Laveda Abbe M.D.   On: 03/08/2013 23:15    Scheduled Meds: . sodium chloride   Intravenous STAT  . amLODipine  2.5 mg Oral Daily  . carvedilol  12.5 mg Oral BID WC  . cloNIDine  0.2 mg Oral Daily  . divalproex  500 mg Oral BID  . enoxaparin (LOVENOX) injection  40 mg Subcutaneous Q24H  . ferrous sulfate  325 mg Oral BID WC  . furosemide  40 mg Oral Daily  . hydrALAZINE  100 mg Oral Q8H  . lisinopril  20 mg Oral BID  . nitroGLYCERIN  1 inch Topical Q6H  . pantoprazole  40 mg Oral BID  . phenytoin (DILANTIN) IV  100 mg Intravenous Q8H  . potassium chloride  10 mEq Oral Daily  . simvastatin  40 mg Oral QPM  . spironolactone  50 mg Oral Daily    Continuous Infusions: . sodium chloride 75 mL/hr at 03/08/13 1857  . sodium chloride 75 mL/hr at 03/09/13 0500    Principal Problem:   Chest pain Active Problems:   Hypertension   Chronic systolic heart failure   CAD (coronary artery disease)   Chronic kidney disease (CKD), stage III (moderate)   Convulsions/seizures   Cocaine abuse    Time spent: > 35 minutes     Esperanza Sheets  [facility] Pager [phone]. If 7PM-7AM, please contact night-coverage at [email or web address], password [facility] 03/09/2013, 8:15 AM  LOS: 1 day

## 2013-03-09 NOTE — ED Provider Notes (Signed)
CSN: [id number]     Arrival date & time 03/08/13  1727 History   First MD Initiated Contact with Patient 03/08/13 1732     Chief Complaint  Patient presents with  . Chest Pain  . Shortness of Breath  . Nausea   (Consider location/radiation/quality/duration/timing/severity/associated sxs/prior Treatment) Patient is a 67 y.o. male presenting with chest pain and shortness of breath. The history is provided by the patient and the EMS personnel.  Chest Pain Associated symptoms: back pain, cough, headache, shortness of breath and weakness   Associated symptoms: no abdominal pain, no fever, no nausea and not vomiting   Shortness of Breath Associated symptoms: chest pain, cough and headaches   Associated symptoms: no abdominal pain, no fever, no rash and no vomiting    the patient brought in by EMS initial call was for generalized weakness. When they got there he talked about having left-sided chest pain shortness of breath starting at 7 in the morning that was on and off. EMS at some point the chest pain became constant given aspirin and nitroglycerin and Zofran. Patient has a known history of seizures and congestive heart failure and COPD. Patient's history was delayed some after presentation here because he had a generalized seizure. Once he recovered from that and was no longer postictal. Patient was complaining of persistent left-sided chest pain also low back pain also pleuritic chest pain also stated that he had blood in his stool and that he was coughing up blood. Patient has a history of coronary disease had a CABG in 2009 his cardiologist as an [place] his primary care Dr. is in [facility] as per the patient.  Past Medical History  Diagnosis Date  . Iron deficiency anemia   . Hypertension   . Chronic systolic heart failure     2D Echo (03/2010) - EF 35-40% with akinesis of inferoposterior myocardium  . CAD (coronary artery disease)     s/p 3-vessel CABG (12/2007) // 100% RCA stenosis  with collaterals from left system. Severe bifurcation lesions of proxima CXA and OM. Moderate LAD diseaseFollowed by Dr. Rhona Leav[facility]  . Hyperlipidemia LDL goal <70   . Obstructive sleep apnea     not on home CPAP  . Ischemic cardiomyopathy   . Chronic kidney disease (CKD), stage III (moderate)     BL SCr 1.5-1.6  . Homelessness   . 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 // Colonoscopy and EGD in 03/2010 negative per report (record not found)  . 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 Polysubstance abuse     cocaine, THC  . AV malformation of gastrointestinal tract   . Family history of early CAD   . DDD (degenerative disc disease), lumbar   . Renal artery stenosis   . Peripheral vascular disease   . Seizure disorder   . Diabetes   . Acute on chronic systolic CHF (congestive heart failure) 08/13/2012  . Myocardial infarction   . Seizures    Past Surgical History  Procedure Laterality Date  . Coronary artery bypass graft  12/2007  . Apc  03/2010    To treat small bowel AVMs  . Esophagogastroduodenoscopy N/A 08/14/2012    Procedure: ESOPHAGOGASTRODUODENOSCOPY (EGD);  Surgeon: Charna Elizabeth, MD;  Location: [facility] ENDOSCOPY;  Service: Endoscopy;  Laterality: N/A;  .  Hot hemostasis N/A 08/14/2012    Procedure: HOT HEMOSTASIS (ARGON PLASMA COAGULATION/BICAP);  Surgeon: Charna Elizabeth, MD;  Location: [facility] ENDOSCOPY;  Service: Endoscopy;  Laterality: N/A;   Family History  Problem Relation Age of Onset  . Heart disease Mother     unknown type  . Heart disease Father 73    died of MI at 46yo  . Heart disease Paternal Grandfather 23    died of MI  . Heart disease    . Heart disease Brother 30   History  Substance Use Topics  . Smoking status: Current Every Day Smoker -- 0.20 packs/day for 13 years    Types: Cigarettes    Last  Attempt to Quit: 03/20/1997  . Smokeless tobacco: Never Used  . Alcohol Use: Yes     Comment: occasionally drinks, a few times a month    Review of Systems  Constitutional: Negative for fever.  HENT: Negative for congestion.   Respiratory: Positive for cough and shortness of breath.   Cardiovascular: Positive for chest pain.  Gastrointestinal: Negative for nausea, vomiting and abdominal pain.  Genitourinary: Negative for dysuria.  Musculoskeletal: Positive for back pain.  Skin: Negative for rash.  Neurological: Positive for seizures, weakness and headaches.  Hematological: Does not bruise/bleed easily.  Psychiatric/Behavioral: Negative for confusion.    Allergies  Motrin and Tylenol  Home Medications   Current Outpatient Rx  Name  Route  Sig  Dispense  Refill  . amLODipine (NORVASC) 2.5 MG tablet   Oral   Take 2.5 mg by mouth daily.         [name] aspirin 81 MG tablet   Oral   Take 81 mg by mouth daily.         . carvedilol (COREG) 12.5 MG tablet   Oral   Take 12.5 mg by mouth 2 (two) times daily with a meal.         . cloNIDine (CATAPRES) 0.2 MG tablet   Oral   Take 0.2 mg by mouth daily.         . diazepam (VALIUM) 5 MG tablet   Oral   Take 5 mg by mouth every 6 (six) hours as needed for anxiety or sleep.         . divalproex (DEPAKOTE) 500 MG DR tablet   Oral   Take 500 mg by mouth 2 (two) times daily.         . ferrous sulfate 325 (65 FE) MG tablet   Oral   Take 325 mg by mouth 2 (two) times daily with a meal.         . furosemide (LASIX) 40 MG tablet   Oral   Take 40 mg by mouth daily.         . hydrALAZINE (APRESOLINE) 100 MG tablet   Oral   Take 100 mg by mouth every 8 (eight) hours.         [name] lisinopril (PRINIVIL,ZESTRIL) 20 MG tablet   Oral   Take 20 mg by mouth 2 (two) times daily.         . pantoprazole (PROTONIX) 40 MG tablet   Oral   Take 40 mg by mouth 2 (two) times daily.         . phenytoin (DILANTIN) 100 MG ER  capsule   Oral   Take 200-300 mg by mouth 2 (two) times daily. Take 2 capsules by mouth in the morning and 3 capsules by mouth at bedtime.         [name]  potassium chloride (K-DUR) 10 MEQ tablet   Oral   Take 10 mEq by mouth daily.         . simvastatin (ZOCOR) 40 MG tablet   Oral   Take 40 mg by mouth every evening.         [name] spironolactone (ALDACTONE) 50 MG tablet   Oral   Take 50 mg by mouth daily.          BP 148/99  Pulse 80  Temp(Src) 97.9 F (36.6 C) (Oral)  Resp 18  Ht 5\' 5"  (1.651 m)  Wt 165 lb (74.844 kg)  BMI 27.46 kg/m2  SpO2 99% Physical Exam  Nursing note and vitals reviewed. Constitutional: He is oriented to person, place, and time. He appears well-developed and well-nourished. No distress.  HENT:  Head: Normocephalic and atraumatic.  Eyes: Conjunctivae and EOM are normal. Pupils are equal, round, and reactive to light.  Neck: Normal range of motion.  Cardiovascular: Normal rate, regular rhythm and normal heart sounds.   No murmur heard. Pulmonary/Chest: Effort normal and breath sounds normal. No respiratory distress. He has no wheezes. He has no rales. He exhibits no tenderness.  Abdominal: Soft. Bowel sounds are normal. There is no tenderness.  Musculoskeletal: Normal range of motion. He exhibits no edema.  Neurological: He is alert and oriented to person, place, and time. No cranial nerve deficit. He exhibits normal muscle tone. Coordination normal.  Skin: Skin is warm. No rash noted.    ED Course  Procedures (including critical care time) Labs Review Labs Reviewed  CBC - Abnormal; Notable for the following:    RBC 2.83 (*)    Hemoglobin 9.1 (*)    HCT 26.5 (*)    All other components within normal limits  BASIC METABOLIC PANEL - Abnormal; Notable for the following:    Potassium 3.4 (*)    CO2 17 (*)    GFR calc non Af Amer 68 (*)    GFR calc Af Amer 79 (*)    All other components within normal limits  PRO B NATRIURETIC PEPTIDE - Abnormal;  Notable for the following:    Pro B Natriuretic peptide (BNP) 9784.0 (*)    All other components within normal limits  HEPATIC FUNCTION PANEL - Abnormal; Notable for the following:    Albumin 3.2 (*)    Indirect Bilirubin 0.1 (*)    All other components within normal limits  URINE RAPID DRUG SCREEN (HOSP PERFORMED) - Abnormal; Notable for the following:    Cocaine POSITIVE (*)    All other components within normal limits  URINALYSIS, ROUTINE W REFLEX MICROSCOPIC - Abnormal; Notable for the following:    Protein, ur >300 (*)    All other components within normal limits  PHENYTOIN LEVEL, TOTAL - Abnormal; Notable for the following:    Phenytoin Lvl <2.5 (*)    All other components within normal limits  VALPROIC ACID LEVEL - Abnormal; Notable for the following:    Valproic Acid Lvl <10.0 (*)    All other components within normal limits  URINE MICROSCOPIC-ADD ON - Abnormal; Notable for the following:    Casts HYALINE CASTS (*)    All other components within normal limits  ETHANOL  GLUCOSE, CAPILLARY  TROPONIN I  POCT I-STAT TROPONIN I  OCCULT BLOOD, POC DEVICE   Imaging Review Dg Chest 2 View (if Patient Has Fever And/or Copd)  03/08/2013   CLINICAL DATA:  Chest pain, shortness of Breath  EXAM: CHEST - 2 VIEW  COMPARISON:  10/11/2012  FINDINGS: Previous CABG. Stable mild cardiomegaly. Patient is rotated towards the left. There is a mild dextroscoliosis of the thoracic spine as before. Central pulmonary vascular congestion. There is some patchy subsegmental atelectasis versus early infiltrate at the left lung base. No definite effusion.  IMPRESSION: 1. New left lower lobe subsegmental atelectasis versus early infiltrate. 2. Stable mild cardiomegaly.   Electronically Signed   By: Oley Balm M.D.   On: 03/08/2013 20:46   Ct Head Wo Contrast  03/08/2013   CLINICAL DATA:  67 year old male with seizure.  EXAM: CT HEAD WITHOUT CONTRAST  TECHNIQUE: Contiguous axial images were obtained  from the base of the skull through the vertex without intravenous contrast.  COMPARISON:  03/27/2010 CT  FINDINGS: Atrophy, mild chronic small-vessel white matter ischemic changes left frontal encephalomalacia again noted.  No acute intracranial abnormalities are identified, including mass lesion or mass effect, hydrocephalus, extra-axial fluid collection, midline shift, hemorrhage, or acute infarction. The visualized bony calvarium is unremarkable.  IMPRESSION: No evidence of acute intracranial abnormality.  Small atrophy and chronic small-vessel white matter ischemic changes.   Electronically Signed   By: Laveda Abbe M.D.   On: 03/08/2013 20:40   Ct Angio Chest Pe W/cm &/or Wo Cm  03/08/2013   CLINICAL DATA:  67 year old male with chest pain and shortness of breath.  EXAM: CT ANGIOGRAPHY CHEST WITH CONTRAST  TECHNIQUE: Multidetector CT imaging of the chest was performed using the standard protocol during bolus administration of intravenous contrast. Multiplanar CT image reconstructions including MIPs were obtained to evaluate the vascular anatomy.  CONTRAST:  OMNIPAQUE IOHEXOL 350 MG/ML SOLN  COMPARISON:  02/2019 and prior chest radiographs  FINDINGS: This is a technically satisfactory study.  No pulmonary emboli are identified.  Ectasia of the thoracic aorta is noted without aneurysm.  Cardiomegaly and cardiac surgical changes are identified.  Trace pleural effusions are present.  No enlarged lymph nodes are identified.  Mild bibasilar atelectasis/scarring noted.  Mild interlobular septal thickening bilaterally is present.  There is no evidence of consolidation, airspace disease or pulmonary nodule/mass.  No acute upper abdominal abnormalities are noted.  A small hiatal hernia is present.  No acute or suspicious bony abnormalities are present.  Review of the MIP images confirms the above findings.  IMPRESSION: No evidence of pulmonary emboli.  Trace pleural effusions with mild bibasilar atelectasis/  scarring.  Mild interlobular septal thickening bilaterally of uncertain chronicity. Mild interstitial edema is not excluded.  Cardiomegaly and ectatic thoracic aorta.  Small hiatal hernia.   Electronically Signed   By: Laveda Abbe M.D.   On: 03/08/2013 23:15    EKG Interpretation    Date/Time:  Saturday March 08 2013 17:35:51 EST Ventricular Rate:  104 PR Interval:  90 QRS Duration: 115 QT Interval:  365 QTC Calculation: 480 R Axis:   32 Text Interpretation:  Sinus or ectopic atrial tachycardia Ventricular premature complex Incomplete right bundle branch block Inferior infarct, age indeterminate Repol abnrm suggests ischemia, diffuse leads Baseline wander in lead(s) II III aVL aVF Confirmed by Bradden Tadros  MD, Leeam Cedrone ([id number]) on 03/08/2013 6:06:18 PM Also confirmed by Deretha Emory  MD, Aleshka Corney ([id number])  on 03/08/2013 9:56:32 PM           CRITICAL CARE Performed by: Shelda Jakes. Total critical care time: 30 Critical care time was exclusive of separately billable procedures and treating other patients. Critical care was necessary to treat or prevent imminent or life-threatening deterioration. Critical care was time spent personally  by me on the following activities: development of treatment plan with patient and/or surrogate as well as nursing, discussions with consultants, evaluation of patient's response to treatment, examination of patient, obtaining history from patient or surrogate, ordering and performing treatments and interventions, ordering and review of laboratory studies, ordering and review of radiographic studies, pulse oximetry and re-evaluation of patient's condition.   MDM   1. Chest pain   2. Seizure    Patient will require admission for persistent chest pain in a patient with history of coronary artery disease. Patient is also cocaine abuser.  Patient had seizure shortly after arrival require treatment with Ativan and diet plan low. The patient was brought in by EMS for  feeling generalized weakness ankle EMS for chest pain earlier in the day and at all on some point on the way here developed more persistent chest pain they gave him aspirin nitroglycerin. Shortly after he arrived here patient  Had a seizure he has a history of generalized seizures. Status post be on Dilantin and Depakote. Both levels were essentially Cipro here. For the seizure patient received Dilantin bolus after being treated with Ativan. The seizure lasted for about 5 minutes and patient was pleuritic to post ictal for a period of time.  Workup for the seizure in the multiple complaints to include left-sided chest pain some pleuritic chest pain some back pain the seizure all essentially negative first troponin was negative EKG without a lot of acute changes he did have some premature ventricular contractions. First troponin was negative chest x-ray raises concerns for pneumonia CT angios was done no evidence of pulley of pulmonary embolus or pneumonia. Patient following the Dilantin load in the Ativan that remained seizure-free.  . Never able to give patient completely chest pain-free think some of it may be pruritic chest pain some of it may be cardiac cardiac in nature not able to determine patient patient did have a bypass surgery in 2009 his cardiology [place] his primary care doctors in [facility].  Discussed with the hospitalist they will admit for chest pain rule out. Patient going step downs is chest pain-free.  Additional workups for his complaints which included the blood in his stool Hemoccult was negative here patient has not coughed up any blood here hemoglobin and hematocrit are negative.  Shelda Jakes, MD 03/09/13 [phone]

## 2013-03-09 NOTE — H&P (Signed)
[facility] History and Physical  Amalio Loe WJX:[id number] DOB: May 10, 1945 DOA: 03/08/2013  Referring physician:  EDP PCP: Bernerd Limbo  Specialists:   Chief Complaint:  Chest Pain  HPI: Kaiden Pech is a 67 y.o. male with Multiple Medical problems including CAD S/P CABG in 2009 , Seizure disorder who presents to the ED with complaints of intermittent chest pain since 7 AM.   He reports that the pain was a 9/10 and in the mid -chest area.  He reports having SOB and DOE and weakness when he was walking so he called EMS.   When he was seen in the ED, he suffered witnessed seizure and was medicated IV Lorazepam and his laboratory studies returned with sub-therapeutic levels of Dilantin so then he was administered an IV loading dose of Dilantin.  Patient reports being out of his medications since 12/08 when his medicaid ran out.   His initial cardiac workup was negative and he was referred for medical admission.      Review of Systems: The patient denies anorexia, fever, chills, headaches, weight loss, vision loss, diplopia, dizziness, decreased hearing, rhinitis, hoarseness, peripheral edema, balance deficits, cough, hemoptysis, abdominal pain, nausea, vomiting, diarrhea, constipation, hematemesis, melena, hematochezia, severe indigestion/heartburn, dysuria, hematuria, incontinence, muscle weakness, suspicious skin lesions, transient blindness, difficulty walking, depression, unusual weight change, abnormal bleeding, enlarged lymph nodes, angioedema, and breast masses.    Past Medical History  Diagnosis Date  . Iron deficiency anemia   . Hypertension   . Chronic systolic heart failure     2D Echo (03/2010) - EF 35-40% with akinesis of inferoposterior myocardium  . CAD (coronary artery disease)     s/p 3-vessel CABG (12/2007) // 100% RCA stenosis with collaterals from left system. Severe bifurcation lesions of proxima CXA and OM. Moderate LAD diseaseFollowed by Dr[facility]  . Hyperlipidemia LDL goal <70   . Obstructive sleep apnea     not on home CPAP  . Ischemic cardiomyopathy   . Chronic kidney disease (CKD), stage III (moderate)     BL SCr 1.5-1.6  . Homelessness   . 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 // Colonoscopy and EGD in 03/2010 negative per report (record not found)  . 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 Polysubstance abuse     cocaine, THC  . AV malformation of gastrointestinal tract   . Family history of early CAD   . DDD (degenerative disc disease), lumbar   . Renal artery stenosis   . Peripheral vascular disease   . Seizure disorder   . Diabetes   . Acute on chronic systolic CHF (congestive heart failure) 08/13/2012  . Myocardial infarction   . Seizures     Past Surgical History  Procedure Laterality Date  . Coronary artery bypass graft  12/2007  . Apc  03/2010    To treat small bowel AVMs  . Esophagogastroduodenoscopy N/A 08/14/2012    Procedure: ESOPHAGOGASTRODUODENOSCOPY (EGD);  Surgeon: Charna Elizabeth, MD;  Location[facility] ENDOSCOPY;  Service: Endoscopy;  Laterality: N/A;  . Hot hemostasis N/A 08/14/2012    Procedure: HOT HEMOSTASIS (ARGON PLASMA COAGULATION/BICAP);  Surgeon: Charna Elizabeth, MD;  Location: [facility] ENDOSCOPY;  Service: Endoscopy;  Laterality: N/A;    Prior to Admission medications   Medication Sig Start Date End Date Taking? Authorizing  Provider  amLODipine (NORVASC) 2.5 MG tablet Take 2.5 mg by mouth daily.   Yes Historical Provider, MD  aspirin 81 MG tablet Take 81 mg by mouth daily.   Yes Historical Provider, MD  carvedilol (COREG) 12.5 MG tablet Take 12.5 mg by mouth 2 (two) times daily with a meal.   Yes Historical Provider, MD  cloNIDine (CATAPRES) 0.2 MG tablet Take 0.2 mg by mouth daily.   Yes Historical Provider, MD  diazepam (VALIUM) 5 MG  tablet Take 5 mg by mouth every 6 (six) hours as needed for anxiety or sleep.   Yes Historical Provider, MD  divalproex (DEPAKOTE) 500 MG DR tablet Take 500 mg by mouth 2 (two) times daily.   Yes Historical Provider, MD  ferrous sulfate 325 (65 FE) MG tablet Take 325 mg by mouth 2 (two) times daily with a meal.   Yes Historical Provider, MD  furosemide (LASIX) 40 MG tablet Take 40 mg by mouth daily.   Yes Historical Provider, MD  hydrALAZINE (APRESOLINE) 100 MG tablet Take 100 mg by mouth every 8 (eight) hours.   Yes Historical Provider, MD  lisinopril (PRINIVIL,ZESTRIL) 20 MG tablet Take 20 mg by mouth 2 (two) times daily.   Yes Historical Provider, MD  pantoprazole (PROTONIX) 40 MG tablet Take 40 mg by mouth 2 (two) times daily.   Yes Historical Provider, MD  phenytoin (DILANTIN) 100 MG ER capsule Take 200-300 mg by mouth 2 (two) times daily. Take 2 capsules by mouth in the morning and 3 capsules by mouth at bedtime.   Yes Historical Provider, MD  potassium chloride (K-DUR) 10 MEQ tablet Take 10 mEq by mouth daily.   Yes Historical Provider, MD  simvastatin (ZOCOR) 40 MG tablet Take 40 mg by mouth every evening.   Yes Historical Provider, MD  spironolactone (ALDACTONE) 50 MG tablet Take 50 mg by mouth daily.   Yes Historical Provider, MD    Allergies  Allergen Reactions  . Motrin [Ibuprofen] Hives and Nausea And Vomiting  . Tylenol [Acetaminophen] Hives and Nausea And Vomiting    Social History:  reports that he has been smoking Cigarettes.  He has a 2.6 pack-year smoking history. He has never used smokeless tobacco. He reports that he drinks alcohol. He reports that he uses illicit drugs (Cocaine and Marijuana).     Family History  Problem Relation Age of Onset  . Heart disease Mother     unknown type  . Heart disease Father 79    died of MI at 40yo  . Heart disease Paternal Grandfather 14    died of MI  . Heart disease    . Heart disease Brother 30      Physical Exam:  GEN:   Pleasant  Obese  67 y.o. African American male  examined  and in no acute distress; cooperative with exam Filed Vitals:   03/08/13 2115 03/08/13 2317 03/09/13 0015 03/09/13 0231  BP: 161/91 148/99 140/92 145/74  Pulse: 80  63 92  Temp:      TempSrc:      Resp: 18 18 22    Height:      Weight:      SpO2: 100% 99% 99% 98%   Blood pressure 145/74, pulse 92, temperature 97.9 F (36.6 C), temperature source Oral, resp. rate 22, height 5\' 5"  (1.651 m), weight 74.844 kg (165 lb), SpO2 98.00%. PSYCH: He is alert and oriented x4; does not appear anxious does not appear depressed; affect is normal HEENT: Normocephalic and  Atraumatic, Mucous membranes pink; PERRLA; EOM intact; Fundi:  Benign;  No scleral icterus, Nares: Patent, Oropharynx: Clear, Poor Dentition, Neck:  FROM, no cervical lymphadenopathy nor thyromegaly or carotid bruit; no JVD; Breasts:: Not examined CHEST WALL: No tenderness CHEST: Normal respiration, clear to auscultation bilaterally HEART: Regular rate and rhythm; no murmurs rubs or gallops BACK: No kyphosis or scoliosis; no CVA tenderness ABDOMEN: Positive Bowel Sounds,  Obese, soft non-tender; no masses, no organomegaly, no pannus; no intertriginous candida. Rectal Exam: Not done EXTREMITIES: No cyanosis, clubbing or edema; no ulcerations. Genitalia: not examined PULSES: 2+ and symmetric SKIN: Normal hydration no rash or ulceration CNS: Cranial nerves 2-12 grossly intact no focal neurologic deficit    Labs on Admission:  Basic Metabolic Panel:  Recent Labs Lab 03/08/13 1743  NA 138  K 3.4*  CL 104  CO2 17*  GLUCOSE 85  BUN 17  CREATININE 1.08  CALCIUM 8.9   Liver Function Tests:  Recent Labs Lab 03/08/13 1853  AST 32  ALT 17  ALKPHOS 69  BILITOT 0.3  PROT 7.2  ALBUMIN 3.2*   No results found for this basename: LIPASE, AMYLASE,  in the last 168 hours No results found for this basename: AMMONIA,  in the last 168 hours CBC:  Recent Labs Lab  03/08/13 1743  WBC 5.3  HGB 9.1*  HCT 26.5*  MCV 93.6  PLT 241   Cardiac Enzymes:  Recent Labs Lab 03/08/13 2315  TROPONINI <0.30    BNP (last 3 results)  Recent Labs  08/06/12 0359 08/13/12 0720 03/08/13 1747  PROBNP 807.8* 862.5* 9784.0*   CBG:  Recent Labs Lab 03/08/13 1849  GLUCAP 86    Radiological Exams on Admission: Dg Chest 2 View (if Patient Has Fever And/or Copd)  03/08/2013   CLINICAL DATA:  Chest pain, shortness of Breath  EXAM: CHEST - 2 VIEW  COMPARISON:  10/11/2012  FINDINGS: Previous CABG. Stable mild cardiomegaly. Patient is rotated towards the left. There is a mild dextroscoliosis of the thoracic spine as before. Central pulmonary vascular congestion. There is some patchy subsegmental atelectasis versus early infiltrate at the left lung base. No definite effusion.  IMPRESSION: 1. New left lower lobe subsegmental atelectasis versus early infiltrate. 2. Stable mild cardiomegaly.   Electronically Signed   By: Oley Balm M.D.   On: 03/08/2013 20:46   Ct Head Wo Contrast  03/08/2013   CLINICAL DATA:  67 year old male with seizure.  EXAM: CT HEAD WITHOUT CONTRAST  TECHNIQUE: Contiguous axial images were obtained from the base of the skull through the vertex without intravenous contrast.  COMPARISON:  03/27/2010 CT  FINDINGS: Atrophy, mild chronic small-vessel white matter ischemic changes left frontal encephalomalacia again noted.  No acute intracranial abnormalities are identified, including mass lesion or mass effect, hydrocephalus, extra-axial fluid collection, midline shift, hemorrhage, or acute infarction. The visualized bony calvarium is unremarkable.  IMPRESSION: No evidence of acute intracranial abnormality.  Small atrophy and chronic small-vessel white matter ischemic changes.   Electronically Signed   By: Laveda Abbe M.D.   On: 03/08/2013 20:40   Ct Angio Chest Pe W/cm &/or Wo Cm  03/08/2013   CLINICAL DATA:  67 year old male with chest pain and  shortness of breath.  EXAM: CT ANGIOGRAPHY CHEST WITH CONTRAST  TECHNIQUE: Multidetector CT imaging of the chest was performed using the standard protocol during bolus administration of intravenous contrast. Multiplanar CT image reconstructions including MIPs were obtained to evaluate the vascular anatomy.  CONTRAST:  OMNIPAQUE IOHEXOL  350 MG/ML SOLN  COMPARISON:  02/2019 and prior chest radiographs  FINDINGS: This is a technically satisfactory study.  No pulmonary emboli are identified.  Ectasia of the thoracic aorta is noted without aneurysm.  Cardiomegaly and cardiac surgical changes are identified.  Trace pleural effusions are present.  No enlarged lymph nodes are identified.  Mild bibasilar atelectasis/scarring noted.  Mild interlobular septal thickening bilaterally is present.  There is no evidence of consolidation, airspace disease or pulmonary nodule/mass.  No acute upper abdominal abnormalities are noted.  A small hiatal hernia is present.  No acute or suspicious bony abnormalities are present.  Review of the MIP images confirms the above findings.  IMPRESSION: No evidence of pulmonary emboli.  Trace pleural effusions with mild bibasilar atelectasis/ scarring.  Mild interlobular septal thickening bilaterally of uncertain chronicity. Mild interstitial edema is not excluded.  Cardiomegaly and ectatic thoracic aorta.  Small hiatal hernia.   Electronically Signed   By: Laveda Abbe M.D.   On: 03/08/2013 23:15     EKG: Independently reviewed. Sinus Tachycardia rate 109,  Evidence of previous RCA infarction seen (q-waves in Inferior Leads), and  +PVC,    Assessment/Plan Principal Problem:   Chest pain Active Problems:   Convulsions/seizures   Hypertension   Chronic systolic heart failure   CAD (coronary artery disease)   Chronic kidney disease (CKD), stage III (moderate)   Cocaine abuse   HX of RAS   1.  Chest Pain- Admit to Telemetry Bed, and cycle Troponins, placed on Nitropaste, O2 And  ASA Rx.  Continue Beta Blocker Rx.    2.   Seizures-   HX , and due to sub-therapeutic Dilantin levels.   RE-Loaded with IV Dilantin in ED.  Continue Depakote Rx.     Seizure Precautions, and PRN IV Ativan for Seizure Activity.    3.   HTN-  Resume Home Medications for BP.   On Lasix, KCl , Spironolactone, Clonidine, Hydralazine, and Carvedilol, Lisinopril, and Amlodipine.     D/C Lisinopril due to RAS, and CKD Stage III.     4.   CKD Stage III-   Monitor BUN/Cr levels.        5.  Cocaine Abuse- +UDS, Counseled, PRN IV Ativan for agitation or signs of Withdrawal.    6.   CAD hx.  Continue  Meds for HTN, and Statin Rx.    7.  Chronic systolic CHF- continue on Lasix, Spironolactone, and Carvedilol Rx.   2D ECHO on 03/2010  With EF=30-40%.    8.   Other - may need assistance through Social work to get his medications.     8.    DVT prophylaxis with Lovenox.        Code Status:    FULL CODE Family Communication:    No Family present Disposition Plan:       Observation  Time spent:  77 Minutes  Ron Parker [facility] Pager [phone]  If 7PM-7AM, please contact night-coverage [email or web address] Password [facility] 03/09/2013, 4:43 AM

## 2013-03-09 NOTE — Progress Notes (Signed)
ANTICOAGULATION CONSULT NOTE - Initial Consult  Pharmacy Consult for Heparin Indication: chest pain/ACS  Allergies  Allergen Reactions  . Motrin [Ibuprofen] Hives and Nausea And Vomiting  . Tylenol [Acetaminophen] Hives and Nausea And Vomiting    Patient Measurements: Height: 5\' 5"  (165.1 cm) Weight: 165 lb (74.844 kg) IBW/kg (Calculated) : 61.5  Vital Signs: Temp: 100.5 F (38.1 C) (12/21 1200) Temp src: Oral (12/21 1200) BP: 133/70 mmHg (12/21 1200) Pulse Rate: 98 (12/21 1200)  Labs:  Recent Labs  03/08/13 1743 03/08/13 2315 03/09/13 0431 03/09/13 1330  HGB 9.1*  --  8.6*  --   HCT 26.5*  --  25.3*  --   PLT 241  --  242  --   CREATININE 1.08  --  1.14  --   TROPONINI  --  <0.30  --  0.54*    Estimated Creatinine Clearance: 57.8 ml/min (by C-G formula based on Cr of 1.14).   Medical History: Past Medical History  Diagnosis Date  . Iron deficiency anemia   . Hypertension   . Chronic systolic heart failure     2D Echo (03/2010) - EF 35-40% with akinesis of inferoposterior myocardium  . CAD (coronary artery disease)     s/p 3-vessel CABG (12/2007) // 100% RCA stenosis with collaterals from left system. Severe bifurcation lesions of proxima CXA and OM. Moderate LAD diseaseFollowed by Dr. [facility] in [facility]  . Hyperlipidemia LDL goal <70   . Obstructive sleep apnea     not on home CPAP  . Ischemic cardiomyopathy   . Chronic kidney disease (CKD), stage III (moderate)     BL SCr 1.5-1.6  . Homelessness   . 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 // Colonoscopy and EGD in 03/2010 negative per report (record not found)  . 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 Polysubstance abuse     cocaine, THC  . AV malformation of gastrointestinal tract   . Family history of early CAD   . DDD (degenerative disc  disease), lumbar   . Renal artery stenosis   . Peripheral vascular disease   . Seizure disorder   . Diabetes   . Acute on chronic systolic CHF (congestive heart failure) 08/13/2012  . Myocardial infarction   . Seizures     Assessment: 67 year old male beginning heparin for elevated troponin levels.   He has a history of polysubstance abuse and medication non-compliance.  He presented to the ER with complaints of intermittent chest pain. Renal function stable  Goal of Therapy:  Heparin level 0.3-0.7 units/ml Monitor platelets by anticoagulation protocol: Yes   Plan:  1) Heparin 4000 units iv bolus x 1 2) Heparin drip 1150 units / hr 3) Heparin level 6 hours after heparin begins. 4) Daily heparin level, CBC  Thank you. Okey Regal, PharmD [phone]  Elwin Sleight 03/09/2013,3:10 PM

## 2013-03-10 DIAGNOSIS — K922 Gastrointestinal hemorrhage, unspecified: Secondary | ICD-10-CM

## 2013-03-10 LAB — BASIC METABOLIC PANEL
CO2: 20 mEq/L (ref 19–32)
Calcium: 8 mg/dL — ABNORMAL LOW (ref 8.4–10.5)
GFR calc Af Amer: 54 mL/min — ABNORMAL LOW (ref >90)
Glucose, Bld: 79 mg/dL (ref 70–99)
Sodium: 134 mEq/L — ABNORMAL LOW (ref 135–145)

## 2013-03-10 LAB — CBC
HCT: 23.4 % — ABNORMAL LOW (ref 39.0–52.0)
HCT: 25.4 % — ABNORMAL LOW (ref 39.0–52.0)
HCT: 23.2 % — ABNORMAL LOW (ref 39.0–52.0)
Hemoglobin: 7.8 g/dL — ABNORMAL LOW (ref 13.0–17.0)
Hemoglobin: 8.2 g/dL — ABNORMAL LOW (ref 13.0–17.0)
Hemoglobin: 7.6 g/dL — ABNORMAL LOW (ref 13.0–17.0)
MCH: 31.6 pg (ref 26.0–34.0)
MCH: 30.5 pg (ref 26.0–34.0)
MCH: 30.8 pg (ref 26.0–34.0)
MCHC: 33.3 g/dL (ref 30.0–36.0)
MCHC: 32.8 g/dL (ref 30.0–36.0)
MCV: 94.7 fL (ref 78.0–100.0)
MCV: 94.4 fL (ref 78.0–100.0)
MCV: 93.9 fL (ref 78.0–100.0)
Platelets: 251 10*3/uL (ref 150–400)
RBC: 2.47 MIL/uL — ABNORMAL LOW (ref 4.22–5.81)
RBC: 2.69 MIL/uL — ABNORMAL LOW (ref 4.22–5.81)
RBC: 2.47 MIL/uL — ABNORMAL LOW (ref 4.22–5.81)
RDW: 15.4 % (ref 11.5–15.5)
RDW: 15.3 % (ref 11.5–15.5)
WBC: 5.1 10*3/uL (ref 4.0–10.5)
WBC: 2.8 10*3/uL — ABNORMAL LOW (ref 4.0–10.5)

## 2013-03-10 LAB — TROPONIN I: Troponin I: 0.69 ng/mL (ref <0.30)

## 2013-03-10 LAB — GLUCOSE, CAPILLARY: Glucose-Capillary: 93 mg/dL (ref 70–99)

## 2013-03-10 MED ORDER — POTASSIUM CHLORIDE CRYS ER 20 MEQ PO TBCR
20.0000 meq | EXTENDED_RELEASE_TABLET | Freq: Every day | ORAL | Status: DC
  Administered 2013-03-10 – 2013-03-12 (×3): 20 meq via ORAL
  Filled 2013-03-10 (×3): qty 1

## 2013-03-10 MED ORDER — PNEUMOCOCCAL VAC POLYVALENT 25 MCG/0.5ML IJ INJ
0.5000 mL | INJECTION | INTRAMUSCULAR | Status: AC
  Administered 2013-03-11: 0.5 mL via INTRAMUSCULAR
  Filled 2013-03-10: qty 0.5

## 2013-03-10 MED ORDER — LEVOFLOXACIN IN D5W 750 MG/150ML IV SOLN
750.0000 mg | INTRAVENOUS | Status: DC
  Filled 2013-03-10: qty 150

## 2013-03-10 MED ORDER — FUROSEMIDE 40 MG PO TABS
40.0000 mg | ORAL_TABLET | Freq: Every day | ORAL | Status: DC
  Administered 2013-03-10 – 2013-03-12 (×3): 40 mg via ORAL
  Filled 2013-03-10 (×4): qty 1

## 2013-03-10 NOTE — Progress Notes (Signed)
[facility] PROGRESS NOTE  Charles [name] ZOX:[id number] DOB: May 02, 1945 DOA: 03/08/2013 PCP: Bernerd Limbo  Assessment/Plan: 67 y/o male with PMH of HTN, HPL, seizure, CAD s/p CABG, CHF, CKD, polysubstance abuse, medication non compliance presented with complaints of intermittent chest pain since 7 AM. He reports that the pain was a 9/10 and in the mid -chest area. He reports having SOB and DOE and weakness when he was walking so he called EMS. When he was seen in the ED, he suffered witnessed seizure and was medicated IV Lorazepam and his laboratory studies returned with sub-therapeutic levels of Dilantin so then he was administered an IV loading dose of Dilantin. Patient reports being out of his medications since 12/08   1. NSTEMI Cocaine induced; cont medical management per cards; possible stress test -cont ASA, NG, IV hep; start Cardizem for HR control; hold BB  2. Acute on chronic CHF with pulmonary HTN RSVP 76; CXR: cardiomegaly, congestion; patient was not taking meds including lasix;  -echo (03/2010): LVEF 35%, dilated LV, LA, RV; RSVP 76 -cont diuretics; replace lytes; daily I/O, daily weight;   3. CAD s/p CABG; as above   4. COPD with probable pneumonia LLL; productive cough, fever, chills;  -start empiric IV atx, bronchodilators; blood c/s NTD  5. HTN not at goal;  -resume home meds, hold ACE with IV diuresis;  titrate per response   6. Seizures- HX , and due to sub-therapeutic Dilantin levels. RE-Loaded with IV Dilantin in ED. Continue Depakote Rx.  Seizure Precautions, and PRN IV Ativan for Seizure Activity.   7. Cocaine Abuse- +UDS, Counseled, PRN IV Ativan for agitation or signs of Withdrawal  8. Anemia chronic; h/o mild GIB s/p EGD (07/2012): mucosal bleeding;  -no s/s of acute bleeding; occult blood test neg (12/20); monitor Hg, TF prn; cont iron; PPI  -Other - may need assistance through Social work to get his medications.  Code Status: full Family  Communication: d/w patient  (indicate person spoken with, relationship, and if by phone, the number) Disposition Plan: pend clinical improvement;    Consultants:  Cardiology   Procedures:  None   Antibiotics:  Levofloxacin  (indicate start date, and stop date if known)  HPI/Subjective: alert  Objective: Filed Vitals:   03/10/13 0700  BP: 139/81  Pulse: 98  Temp: 97.3 F (36.3 C)  Resp: 20    Intake/Output Summary (Last 24 hours) at 03/10/13 0852 Last data filed at 03/10/13 0600  Gross per 24 hour  Intake 1475.41 ml  Output      0 ml  Net 1475.41 ml   Filed Weights   03/08/13 1740 03/10/13 0356  Weight: 74.844 kg (165 lb) 83.1 kg (183 lb 3.2 oz)    Exam:   General:  alert  Cardiovascular: s1,s2 tachycardia   Respiratory: LL crackles   Abdomen: soft, nt, nd   Musculoskeletal: no LE edema    Data Reviewed: Basic Metabolic Panel:  Recent Labs Lab 03/08/13 1743 03/09/13 0431 03/10/13 0600  NA 138 137 134*  K 3.4* 4.5 4.0  CL 104 108 104  CO2 17* 15* 20  GLUCOSE 85 91 79  BUN 17 18 18   CREATININE 1.08 1.14 1.47*  CALCIUM 8.9 8.0* 8.0*   Liver Function Tests:  Recent Labs Lab 03/08/13 1853  AST 32  ALT 17  ALKPHOS 69  BILITOT 0.3  PROT 7.2  ALBUMIN 3.2*   No results found for this basename: LIPASE, AMYLASE,  in the last 168 hours No results found  for this basename: AMMONIA,  in the last 168 hours CBC:  Recent Labs Lab 03/08/13 1743 03/09/13 0431 03/10/13 0600  WBC 5.3 5.0 4.3  HGB 9.1* 8.6* 7.8*  HCT 26.5* 25.3* 23.4*  MCV 93.6 92.7 94.7  PLT 241 242 233   Cardiac Enzymes:  Recent Labs Lab 03/08/13 2315 03/09/13 1330 03/09/13 1840 03/10/13 0040  TROPONINI <0.30 0.54* 0.73* 0.69*   BNP (last 3 results)  Recent Labs  08/06/12 0359 08/13/12 0720 03/08/13 1747  PROBNP 807.8* 862.5* 9784.0*   CBG:  Recent Labs Lab 03/08/13 1849 03/09/13 0605 03/09/13 2141  GLUCAP 86 100* 93    Recent Results (from the  past 240 hour(s))  MRSA PCR SCREENING     Status: Abnormal   Collection Time    03/09/13  1:14 AM      Result Value Range Status   MRSA by PCR POSITIVE (*) NEGATIVE Final   Comment:            The GeneXpert MRSA Assay (FDA     approved for NASAL specimens     only), is one component of a     comprehensive MRSA colonization     surveillance program. It is not     intended to diagnose MRSA     infection nor to guide or     monitor treatment for     MRSA infections.     RESULT CALLED TO, READ BACK BY AND VERIFIED WITH:     YOCUM,R RN [phone] AT 0343 SKEEN,P  CULTURE, BLOOD (ROUTINE X 2)     Status: None   Collection Time    03/09/13  1:20 PM      Result Value Range Status   Specimen Description BLOOD LEFT ARM   Final   Special Requests BOTTLES DRAWN AEROBIC AND ANAEROBIC 10CC   Final   Culture  Setup Time     Final   Value: 03/09/2013 17:55     Performed at [facility]   Culture     Final   Value:        BLOOD CULTURE RECEIVED NO GROWTH TO DATE CULTURE WILL BE HELD FOR 5 DAYS BEFORE ISSUING A FINAL NEGATIVE REPORT     Performed at [facility]   Report Status PENDING   Incomplete  CULTURE, BLOOD (ROUTINE X 2)     Status: None   Collection Time    03/09/13  1:30 PM      Result Value Range Status   Specimen Description BLOOD LEFT HAND   Final   Special Requests BOTTLES DRAWN AEROBIC AND ANAEROBIC 10CC   Final   Culture  Setup Time     Final   Value: 03/09/2013 17:55     Performed at [facility]   Culture     Final   Value:        BLOOD CULTURE RECEIVED NO GROWTH TO DATE CULTURE WILL BE HELD FOR 5 DAYS BEFORE ISSUING A FINAL NEGATIVE REPORT     Performed at [facility]   Report Status PENDING   Incomplete     Studies: Dg Chest 2 View (if Patient Has Fever And/or Copd)  03/08/2013   CLINICAL DATA:  Chest pain, shortness of Breath  EXAM: CHEST - 2 VIEW  COMPARISON:  10/11/2012  FINDINGS: Previous CABG. Stable mild cardiomegaly. Patient is  rotated towards the left. There is a mild dextroscoliosis of the thoracic spine as before. Central pulmonary vascular congestion. There  is some patchy subsegmental atelectasis versus early infiltrate at the left lung base. No definite effusion.  IMPRESSION: 1. New left lower lobe subsegmental atelectasis versus early infiltrate. 2. Stable mild cardiomegaly.   Electronically Signed   By: Oley Balm M.D.   On: 03/08/2013 20:46   Ct Head Wo Contrast  03/08/2013   CLINICAL DATA:  67 year old male with seizure.  EXAM: CT HEAD WITHOUT CONTRAST  TECHNIQUE: Contiguous axial images were obtained from the base of the skull through the vertex without intravenous contrast.  COMPARISON:  03/27/2010 CT  FINDINGS: Atrophy, mild chronic small-vessel white matter ischemic changes left frontal encephalomalacia again noted.  No acute intracranial abnormalities are identified, including mass lesion or mass effect, hydrocephalus, extra-axial fluid collection, midline shift, hemorrhage, or acute infarction. The visualized bony calvarium is unremarkable.  IMPRESSION: No evidence of acute intracranial abnormality.  Small atrophy and chronic small-vessel white matter ischemic changes.   Electronically Signed   By: Laveda Abbe M.D.   On: 03/08/2013 20:40   Ct Angio Chest Pe W/cm &/or Wo Cm  03/08/2013   CLINICAL DATA:  67 year old male with chest pain and shortness of breath.  EXAM: CT ANGIOGRAPHY CHEST WITH CONTRAST  TECHNIQUE: Multidetector CT imaging of the chest was performed using the standard protocol during bolus administration of intravenous contrast. Multiplanar CT image reconstructions including MIPs were obtained to evaluate the vascular anatomy.  CONTRAST:  OMNIPAQUE IOHEXOL 350 MG/ML SOLN  COMPARISON:  02/2019 and prior chest radiographs  FINDINGS: This is a technically satisfactory study.  No pulmonary emboli are identified.  Ectasia of the thoracic aorta is noted without aneurysm.  Cardiomegaly and cardiac  surgical changes are identified.  Trace pleural effusions are present.  No enlarged lymph nodes are identified.  Mild bibasilar atelectasis/scarring noted.  Mild interlobular septal thickening bilaterally is present.  There is no evidence of consolidation, airspace disease or pulmonary nodule/mass.  No acute upper abdominal abnormalities are noted.  A small hiatal hernia is present.  No acute or suspicious bony abnormalities are present.  Review of the MIP images confirms the above findings.  IMPRESSION: No evidence of pulmonary emboli.  Trace pleural effusions with mild bibasilar atelectasis/ scarring.  Mild interlobular septal thickening bilaterally of uncertain chronicity. Mild interstitial edema is not excluded.  Cardiomegaly and ectatic thoracic aorta.  Small hiatal hernia.   Electronically Signed   By: Laveda Abbe M.D.   On: 03/08/2013 23:15    Scheduled Meds: . aspirin  325 mg Oral Daily  . atorvastatin  20 mg Oral q1800  . Chlorhexidine Gluconate Cloth  6 each Topical Q0600  . cloNIDine  0.1 mg Oral BID  . diltiazem  120 mg Oral Daily  . divalproex  500 mg Oral BID  . ferrous sulfate  325 mg Oral BID WC  . furosemide  40 mg Oral Daily  . hydrALAZINE  100 mg Oral Q8H  . levofloxacin (LEVAQUIN) IV  750 mg Intravenous Q24H  . mupirocin ointment  1 application Nasal BID  . nitroGLYCERIN  1 inch Topical Q6H  . pantoprazole  40 mg Oral BID  . phenytoin (DILANTIN) IV  100 mg Intravenous Q8H  . [START ON 03/11/2013] pneumococcal 23 valent vaccine  0.5 mL Intramuscular Tomorrow-1000  . potassium chloride  20 mEq Oral Daily  . spironolactone  50 mg Oral Daily   Continuous Infusions: . sodium chloride 10 mL (03/09/13 2100)  . heparin 1,150 Units/hr (03/09/13 1537)    Principal Problem:   Chest  pain Active Problems:   Hypertension   Chronic systolic heart failure   CAD (coronary artery disease)   Chronic kidney disease (CKD), stage III (moderate)   Convulsions/seizures   Cocaine abuse    NSTEMI (non-ST elevated myocardial infarction)    Time spent: > 35 minutes     Esperanza Sheets  [facility] Pager [phone]. If 7PM-7AM, please contact night-coverage at [email or web address], password [email or web address] 03/10/2013, 8:52 AM  LOS: 2 days

## 2013-03-10 NOTE — Progress Notes (Signed)
ANTICOAGULATION CONSULT NOTE - Follow Up Consult  Pharmacy Consult for heparin Indication: NSTEMI  Labs:  Recent Labs  03/08/13 1743 03/08/13 2315 03/09/13 0431 03/09/13 1330 03/09/13 1840 03/09/13 2220  HGB 9.1*  --  8.6*  --   --   --   HCT 26.5*  --  25.3*  --   --   --   PLT 241  --  242  --   --   --   HEPARINUNFRC  --   --   --   --   --  0.36  CREATININE 1.08  --  1.14  --   --   --   TROPONINI  --  <0.30  --  0.54* 0.73*  --     Assessment/Plan:  67yo male therapeutic on heparin with initial dosing for NSTEMI, cath on hold given active infection.  Will continue gtt at current rate and confirm stable with am labs.  Vernard Gambles, PharmD, BCPS  03/10/2013,12:36 AM

## 2013-03-10 NOTE — Progress Notes (Signed)
    Subjective:  Chest pain improved; no dyspnea.   Objective:  Filed Vitals:   03/09/13 2313 03/10/13 0356 03/10/13 0400 03/10/13 0610  BP: 137/69 130/62 130/62 162/91  Pulse:      Temp: 100 F (37.8 C) 99.3 F (37.4 C)    TempSrc: Oral Oral    Resp: 21 16 20    Height:      Weight:  183 lb 3.2 oz (83.1 kg)    SpO2: 96% 97%      Intake/Output from previous day:  Intake/Output Summary (Last 24 hours) at 03/10/13 0744 Last data filed at 03/10/13 0600  Gross per 24 hour  Intake 1790.41 ml  Output      0 ml  Net 1790.41 ml    Physical Exam: Physical exam: Well-developed well-nourished in no acute distress.  Skin is warm and dry.  HEENT is normal.  Neck is supple.  Chest is clear to auscultation with normal expansion.  Cardiovascular exam is regular rate and rhythm.  Abdominal exam nontender or distended. No masses palpated. Extremities show no edema. neuro grossly intact    Lab Results: Basic Metabolic Panel:  Recent Labs  16/10/96 0431 03/10/13 0600  NA 137 134*  K 4.5 4.0  CL 108 104  CO2 15* 20  GLUCOSE 91 79  BUN 18 18  CREATININE 1.14 1.47*  CALCIUM 8.0* 8.0*   CBC:  Recent Labs  03/09/13 0431 03/10/13 0600  WBC 5.0 4.3  HGB 8.6* 7.8*  HCT 25.3* 23.4*  MCV 92.7 94.7  PLT 242 233   Cardiac Enzymes:  Recent Labs  03/09/13 1330 03/09/13 1840 03/10/13 0040  TROPONINI 0.54* 0.73* 0.69*     Assessment/Plan:  67 year old male with known CAD, prior CABG in 2009  1. NSTEMI , Cocaine induced, TnI 0.30--> 0.54/.73/.69. Given multiple medical issues including renal insuff., anemia, cocaine, would favor conservative measures if possible. Will plan nuclear study when he improves for risk stratification.  - continue heparin, aspirin, statin; no plavix given anemia; no beta blocker given cocaine. 2. Acute on chronic systolic CHF - LVEF 35-40%, Not volume overloaded on exam; Cr worse; change lasix to 40 mg daily. ACEI on hold because of renal  insuff; continue hydralazine/nitrates. 3. HTN - controlled, continue current regimen  4. Cocaine Abuse- +UDS, Counseled  5. CKD stage III  6. Pneumonia - on iv ATB  7. Seizures- HX , and due to sub-therapeutic Dilantin levels. RE-Loaded with IV Dilantin in ED.   Olga Millers 03/10/2013, 7:44 AM

## 2013-03-10 NOTE — Progress Notes (Signed)
ANTICOAGULATION CONSULT NOTE - Follow Up Consult  Pharmacy Consult for heparin Indication: NSTEMI  Labs:  Recent Labs  03/08/13 1743  03/09/13 0431 03/09/13 1330 03/09/13 1840 03/09/13 2220 03/10/13 0040 03/10/13 0600  HGB 9.1*  --  8.6*  --   --   --   --  7.8*  HCT 26.5*  --  25.3*  --   --   --   --  23.4*  PLT 241  --  242  --   --   --   --  233  HEPARINUNFRC  --   --   --   --   --  0.36  --  0.46  CREATININE 1.08  --  1.14  --   --   --   --  1.47*  TROPONINI  --   < >  --  0.54* 0.73*  --  0.69*  --   < > = values in this interval not displayed.  Assessment:  67 yo male on heparin for NSTEMI, cath on hold given active infection.  HL has been therapeutic x2 even though RN reported pt has been messing w/ IV line.  H/h has been decreasing and will continue to monitor, but plt are wnl.  No bleeding noted per notes.  Will continue current heparin dose.  Plan: -Continue Heparin drip 1150 units/hr -Monitor daily HL and CBC  Anabel Bene, PharmD Clinical Pharmacist Resident Pager: [phone]   03/10/2013,8:58 AM

## 2013-03-11 LAB — BASIC METABOLIC PANEL
BUN: 18 mg/dL (ref 6–23)
Calcium: 8.3 mg/dL — ABNORMAL LOW (ref 8.4–10.5)
Chloride: 104 mEq/L (ref 96–112)
Creatinine, Ser: 1.28 mg/dL (ref 0.50–1.35)
GFR calc non Af Amer: 55 mL/min — ABNORMAL LOW (ref >90)
Glucose, Bld: 78 mg/dL (ref 70–99)
Potassium: 4.1 mEq/L (ref 3.5–5.1)

## 2013-03-11 LAB — CBC
HCT: 25.2 % — ABNORMAL LOW (ref 39.0–52.0)
HCT: 25.3 % — ABNORMAL LOW (ref 39.0–52.0)
Hemoglobin: 8.2 g/dL — ABNORMAL LOW (ref 13.0–17.0)
Hemoglobin: 8.2 g/dL — ABNORMAL LOW (ref 13.0–17.0)
MCH: 30.4 pg (ref 26.0–34.0)
MCHC: 32.5 g/dL (ref 30.0–36.0)
MCHC: 32.4 g/dL (ref 30.0–36.0)
MCV: 93.7 fL (ref 78.0–100.0)
MCV: 93.7 fL (ref 78.0–100.0)
RBC: 2.7 MIL/uL — ABNORMAL LOW (ref 4.22–5.81)
RDW: 15.4 % (ref 11.5–15.5)
WBC: 3.1 10*3/uL — ABNORMAL LOW (ref 4.0–10.5)
WBC: 3.1 10*3/uL — ABNORMAL LOW (ref 4.0–10.5)

## 2013-03-11 LAB — HEPARIN LEVEL (UNFRACTIONATED): Heparin Unfractionated: 0.73 IU/mL — ABNORMAL HIGH (ref 0.30–0.70)

## 2013-03-11 MED ORDER — HYDRALAZINE HCL 50 MG PO TABS
125.0000 mg | ORAL_TABLET | Freq: Three times a day (TID) | ORAL | Status: DC
  Administered 2013-03-11 – 2013-03-12 (×3): 125 mg via ORAL
  Filled 2013-03-11 (×6): qty 1

## 2013-03-11 MED ORDER — ISOSORBIDE MONONITRATE ER 30 MG PO TB24
30.0000 mg | ORAL_TABLET | Freq: Every day | ORAL | Status: DC
  Administered 2013-03-11 – 2013-03-12 (×2): 30 mg via ORAL
  Filled 2013-03-11 (×3): qty 1

## 2013-03-11 MED ORDER — LISINOPRIL 5 MG PO TABS
5.0000 mg | ORAL_TABLET | Freq: Every day | ORAL | Status: DC
  Administered 2013-03-11: 5 mg via ORAL
  Filled 2013-03-11 (×2): qty 1

## 2013-03-11 MED ORDER — HYDROMORPHONE HCL PF 1 MG/ML IJ SOLN: 0.5000 mg | INTRAMUSCULAR | Status: DC | PRN

## 2013-03-11 MED ORDER — CLONIDINE HCL 0.1 MG PO TABS
0.1000 mg | ORAL_TABLET | Freq: Every day | ORAL | Status: DC
  Administered 2013-03-11 – 2013-03-12 (×2): 0.1 mg via ORAL
  Filled 2013-03-11 (×3): qty 1

## 2013-03-11 MED ORDER — PHENYTOIN SODIUM EXTENDED 100 MG PO CAPS
300.0000 mg | ORAL_CAPSULE | Freq: Every day | ORAL | Status: DC
  Administered 2013-03-11 – 2013-03-12 (×2): 300 mg via ORAL
  Filled 2013-03-11 (×3): qty 3

## 2013-03-11 MED ORDER — PHENYTOIN SODIUM EXTENDED 100 MG PO CAPS
200.0000 mg | ORAL_CAPSULE | Freq: Every day | ORAL | Status: DC
  Administered 2013-03-11 – 2013-03-12 (×2): 200 mg via ORAL
  Filled 2013-03-11 (×3): qty 2

## 2013-03-11 NOTE — Progress Notes (Signed)
Pt c/o of 9/10 lt sided cp around 2030. Pt's VS were stable & are charted. Pt given 1 mg dilaudid. Upon reassessment pt's pain only decrease to 8/10. MD was notified. New orders given to change dilaudid from q3 hrs to q2 hours. Pt's pain is currently a 5/10. Will continue to monitor the pt. Sanda Linger, RN

## 2013-03-11 NOTE — Progress Notes (Addendum)
[facility] PROGRESS NOTE  Charles [name] ION:[id number] DOB: [birth year] DOA: 03/08/2013 PCP: Bernerd Limbo  Assessment/Plan: 67 y/o male with PMH of HTN, HPL, seizure, CAD s/p CABG, CHF, CKD, polysubstance abuse, medication non compliance presented with complaints of intermittent chest pain since 7 AM. He reports that the pain was a 9/10 and in the mid -chest area. He reports having SOB and DOE and weakness when he was walking so he called EMS. When he was seen in the ED, he suffered witnessed seizure and was medicated IV Lorazepam and his laboratory studies returned with sub-therapeutic levels of Dilantin so then he was administered an IV loading dose of Dilantin. Patient reports being out of his medications since 12/08   1. NSTEMI Cocaine induced; cont medical management per cards;  -cont ASA, NG,off IV hep; started Cardizem for HR control; hold BB; stress test in AM  2. Acute on chronic CHF with pulmonary HTN RSVP 76; CXR: cardiomegaly, congestion; patient was not taking meds including lasix;  -echo (03/2010): LVEF 35%, dilated LV, LA, RV; RSVP 76 -cont diuretics; replace lytes; daily I/O, daily weight;   3. CAD s/p CABG; as above   4. COPD with probable pneumonia LLL; productive cough, fever, chills;  -start empiric IV atx, bronchodilators; blood c/s NTD  5. HTN not at goal;  -resume home meds, add lisinopril, increased hydralazine 125 on 12/23 titrate per response   6. Seizures- HX , and due to sub-therapeutic Dilantin levels. RE-Loaded with IV Dilantin in ED. Continue Depakote Rx.  Seizure Precautions, and PRN IV Ativan for Seizure Activity.   7. Cocaine Abuse- +UDS, Counseled, PRN IV Ativan for agitation or signs of Withdrawal  8. Anemia chronic; h/o mild GIB s/p EGD (07/2012): mucosal bleeding;  -no s/s of acute bleeding; occult blood test neg (12/20); monitor Hg, TF prn; cont iron; PPI  -Other - may need assistance through Social work to get his medications.  Code  Status: full Family Communication: d/w patient  (indicate person spoken with, relationship, and if by phone, the number) Disposition Plan: pend clinical improvement; cs/ SW homeless issues, meds   Consultants:  Cardiology   Procedures:  None   Antibiotics:  Levofloxacin  (indicate start date, and stop date if known)  HPI/Subjective: alert  Objective: Filed Vitals:   03/11/13 0817  BP: 183/90  Pulse: 84  Temp: 98.6 F (37 C)  Resp: 23    Intake/Output Summary (Last 24 hours) at 03/11/13 0958 Last data filed at 03/11/13 0900  Gross per 24 hour  Intake 1856.61 ml  Output   2640 ml  Net -783.39 ml   Filed Weights   03/08/13 1740 03/10/13 0356  Weight: 74.844 kg (165 lb) 83.1 kg (183 lb 3.2 oz)    Exam:   General:  alert  Cardiovascular: s1,s2 tachycardia   Respiratory: LL crackles   Abdomen: soft, nt, nd   Musculoskeletal: no LE edema    Data Reviewed: Basic Metabolic Panel:  Recent Labs Lab 03/08/13 1743 03/09/13 0431 03/10/13 0600 03/11/13 0456  NA 138 137 134* 136  K 3.4* 4.5 4.0 4.1  CL 104 108 104 104  CO2 17* 15* 20 22  GLUCOSE 85 91 79 78  BUN 17 18 18 18   CREATININE 1.08 1.14 1.47* 1.28  CALCIUM 8.9 8.0* 8.0* 8.3*   Liver Function Tests:  Recent Labs Lab 03/08/13 1853  AST 32  ALT 17  ALKPHOS 69  BILITOT 0.3  PROT 7.2  ALBUMIN 3.2*   No results found  for this basename: LIPASE, AMYLASE,  in the last 168 hours No results found for this basename: AMMONIA,  in the last 168 hours CBC:  Recent Labs Lab 03/09/13 0431 03/10/13 0600 03/10/13 0927 03/10/13 2105 03/11/13 0456  WBC 5.0 4.3 5.1 2.8* 3.1*  HGB 8.6* 7.8* 8.2* 7.6* 8.2*  HCT 25.3* 23.4* 25.4* 23.2* 25.2*  MCV 92.7 94.7 94.4 93.9 93.7  PLT 242 233 251 229 241   Cardiac Enzymes:  Recent Labs Lab 03/08/13 2315 03/09/13 1330 03/09/13 1840 03/10/13 0040  TROPONINI <0.30 0.54* 0.73* 0.69*   BNP (last 3 results)  Recent Labs  08/06/12 0359 08/13/12 0720  03/08/13 1747  PROBNP 807.8* 862.5* 9784.0*   CBG:  Recent Labs Lab 03/09/13 2141 03/10/13 0812 03/10/13 1234 03/10/13 1701 03/10/13 2052  GLUCAP 93 77 91 89 108*    Recent Results (from the past 240 hour(s))  MRSA PCR SCREENING     Status: Abnormal   Collection Time    03/09/13  1:14 AM      Result Value Range Status   MRSA by PCR POSITIVE (*) NEGATIVE Final   Comment:            The GeneXpert MRSA Assay (FDA     approved for NASAL specimens     only), is one component of a     comprehensive MRSA colonization     surveillance program. It is not     intended to diagnose MRSA     infection nor to guide or     monitor treatment for     MRSA infections.     RESULT CALLED TO, READ BACK BY AND VERIFIED WITH:     YOCUM,R RN [phone] AT 0343 SKEEN,P  CULTURE, BLOOD (ROUTINE X 2)     Status: None   Collection Time    03/09/13  1:20 PM      Result Value Range Status   Specimen Description BLOOD LEFT ARM   Final   Special Requests BOTTLES DRAWN AEROBIC AND ANAEROBIC 10CC   Final   Culture  Setup Time     Final   Value: 03/09/2013 17:55     Performed at [facility]   Culture     Final   Value:        BLOOD CULTURE RECEIVED NO GROWTH TO DATE CULTURE WILL BE HELD FOR 5 DAYS BEFORE ISSUING A FINAL NEGATIVE REPORT     Performed at [facility]   Report Status PENDING   Incomplete  CULTURE, BLOOD (ROUTINE X 2)     Status: None   Collection Time    03/09/13  1:30 PM      Result Value Range Status   Specimen Description BLOOD LEFT HAND   Final   Special Requests BOTTLES DRAWN AEROBIC AND ANAEROBIC 10CC   Final   Culture  Setup Time     Final   Value: 03/09/2013 17:55     Performed at [facility]   Culture     Final   Value:        BLOOD CULTURE RECEIVED NO GROWTH TO DATE CULTURE WILL BE HELD FOR 5 DAYS BEFORE ISSUING A FINAL NEGATIVE REPORT     Performed at [facility]   Report Status PENDING   Incomplete     Studies: No results  found.  Scheduled Meds: . aspirin  325 mg Oral Daily  . atorvastatin  20 mg Oral q1800  . Chlorhexidine Gluconate Cloth  6 each Topical Q0600  . cloNIDine  0.1 mg Oral Daily  . diltiazem  120 mg Oral Daily  . divalproex  500 mg Oral BID  . ferrous sulfate  325 mg Oral BID WC  . furosemide  40 mg Oral Daily  . hydrALAZINE  100 mg Oral Q8H  . isosorbide mononitrate  30 mg Oral Daily  . [START ON 03/12/2013] levofloxacin (LEVAQUIN) IV  750 mg Intravenous Q48H  . lisinopril  5 mg Oral Daily  . mupirocin ointment  1 application Nasal BID  . pantoprazole  40 mg Oral BID  . phenytoin (DILANTIN) IV  100 mg Intravenous Q8H  . pneumococcal 23 valent vaccine  0.5 mL Intramuscular Tomorrow-1000  . potassium chloride  20 mEq Oral Daily  . spironolactone  50 mg Oral Daily   Continuous Infusions: . sodium chloride Stopped (03/11/13 1610)    Principal Problem:   Chest pain Active Problems:   Hypertension   Chronic systolic heart failure   CAD (coronary artery disease)   Chronic kidney disease (CKD), stage III (moderate)   Convulsions/seizures   Cocaine abuse   NSTEMI (non-ST elevated myocardial infarction)    Time spent: > 35 minutes     Esperanza Sheets  [facility] Pager [phone]. If 7PM-7AM, please contact night-coverage at [email or web address], password [email or web address] 03/11/2013, 9:58 AM  LOS: 3 days

## 2013-03-11 NOTE — Progress Notes (Signed)
    Subjective:  Chest pain improved; no dyspnea.   Objective:  Filed Vitals:   03/10/13 1238 03/10/13 1814 03/10/13 2000 03/10/13 2356  BP: 136/60 141/72 152/68 157/66  Pulse: 83 72    Temp: 97.1 F (36.2 C) 97.3 F (36.3 C) 97.9 F (36.6 C) 98 F (36.7 C)  TempSrc: Oral Oral Oral Oral  Resp:   24 21  Height:      Weight:      SpO2: 95% 95% 95% 97%    Intake/Output from previous day:  Intake/Output Summary (Last 24 hours) at 03/11/13 1478 Last data filed at 03/11/13 0300  Gross per 24 hour  Intake   1630 ml  Output   1940 ml  Net   -310 ml    Physical Exam: Physical exam: Well-developed well-nourished in no acute distress.  Skin is warm and dry.  HEENT is normal.  Neck is supple.  Chest is clear to auscultation with normal expansion.  Cardiovascular exam is regular rate and rhythm.  Abdominal exam nontender or distended. No masses palpated. Extremities show no edema. neuro grossly intact    Lab Results: Basic Metabolic Panel:  Recent Labs  29/56/21 0600 03/11/13 0456  NA 134* 136  K 4.0 4.1  CL 104 104  CO2 20 22  GLUCOSE 79 78  BUN 18 18  CREATININE 1.47* 1.28  CALCIUM 8.0* 8.3*   CBC:  Recent Labs  03/10/13 2105 03/11/13 0456  WBC 2.8* 3.1*  HGB 7.6* 8.2*  HCT 23.2* 25.2*  MCV 93.9 93.7  PLT 229 241   Cardiac Enzymes:  Recent Labs  03/09/13 1330 03/09/13 1840 03/10/13 0040  TROPONINI 0.54* 0.73* 0.69*     Assessment/Plan:  67 year old male with known CAD, prior CABG in 2009  1. NSTEMI , Cocaine induced, TnI 0.30--> 0.54/.73/.69. Given multiple medical issues including anemia and cocaine, would favor conservative measures if possible. Will plan nuclear study in AM for risk stratification.  - continue aspirin, statin; no plavix given anemia; no beta blocker given cocaine. DC heparin. 2. Acute on chronic systolic CHF - LVEF 35-40%, Not volume overloaded on exam; continue present dose of lasix and spironolactone; resume  lisinopril 5 mg daily. Continue hydralazine/nitrates. 3. HTN - Will wean clonidine to off given h/o noncompliance and potential rebound HTN if he suddenly stops med. 4. Cocaine Abuse- +UDS, Counseled  5. CKD stage III  6. Pneumonia - on iv ATB  7. Seizures- HX , and due to sub-therapeutic Dilantin levels. RE-Loaded with IV Dilantin in ED.   Olga Millers 03/11/2013, 7:02 AM

## 2013-03-11 NOTE — Progress Notes (Signed)
67yo male now slightly supratherapeutic on heparin (level 0.73) though RN reports that phlebotomy drew heparin from same arm as heparin is running though below IV site.  This may have affected level, so will continue without change for now and recheck level.  Vernard Gambles, PharmD, BCPS 03/11/2013 6:08 AM

## 2013-03-12 ENCOUNTER — Inpatient Hospital Stay (HOSPITAL_COMMUNITY): Payer: Medicare Other

## 2013-03-12 DIAGNOSIS — R079 Chest pain, unspecified: Secondary | ICD-10-CM

## 2013-03-12 LAB — CBC
HCT: 26 % — ABNORMAL LOW (ref 39.0–52.0)
Hemoglobin: 9 g/dL — ABNORMAL LOW (ref 13.0–17.0)
MCH: 30.6 pg (ref 26.0–34.0)
MCH: 31.5 pg (ref 26.0–34.0)
MCHC: 32.7 g/dL (ref 30.0–36.0)
MCHC: 34.1 g/dL (ref 30.0–36.0)
MCV: 93.5 fL (ref 78.0–100.0)
MCV: 92.3 fL (ref 78.0–100.0)
Platelets: 240 10*3/uL (ref 150–400)
RBC: 2.78 MIL/uL — ABNORMAL LOW (ref 4.22–5.81)
RDW: 15.3 % (ref 11.5–15.5)
RDW: 15.2 % (ref 11.5–15.5)
WBC: 4.7 10*3/uL (ref 4.0–10.5)
WBC: 5.6 10*3/uL (ref 4.0–10.5)

## 2013-03-12 LAB — BASIC METABOLIC PANEL
BUN: 17 mg/dL (ref 6–23)
CO2: 21 mEq/L (ref 19–32)
Calcium: 8.5 mg/dL (ref 8.4–10.5)
Chloride: 101 mEq/L (ref 96–112)
Creatinine, Ser: 1.28 mg/dL (ref 0.50–1.35)
Glucose, Bld: 99 mg/dL (ref 70–99)
Sodium: 134 mEq/L — ABNORMAL LOW (ref 135–145)

## 2013-03-12 MED ORDER — HYDRALAZINE HCL 50 MG PO TABS
100.0000 mg | ORAL_TABLET | Freq: Three times a day (TID) | ORAL | Status: DC
  Administered 2013-03-12 – 2013-03-13 (×3): 100 mg via ORAL
  Filled 2013-03-12 (×7): qty 2

## 2013-03-12 MED ORDER — LEVOFLOXACIN IN D5W 750 MG/150ML IV SOLN
750.0000 mg | Freq: Every day | INTRAVENOUS | Status: DC
  Filled 2013-03-12: qty 150

## 2013-03-12 MED ORDER — REGADENOSON 0.4 MG/5ML IV SOLN
0.4000 mg | Freq: Once | INTRAVENOUS | Status: AC
  Administered 2013-03-12: 0.4 mg via INTRAVENOUS
  Filled 2013-03-12: qty 5

## 2013-03-12 MED ORDER — LISINOPRIL 20 MG PO TABS
20.0000 mg | ORAL_TABLET | Freq: Every day | ORAL | Status: DC
  Administered 2013-03-12: 20 mg via ORAL
  Filled 2013-03-12 (×2): qty 1

## 2013-03-12 MED ORDER — NITROGLYCERIN 0.4 MG SL SUBL
0.4000 mg | SUBLINGUAL_TABLET | SUBLINGUAL | Status: DC | PRN
  Administered 2013-03-12: 0.4 mg via SUBLINGUAL

## 2013-03-12 MED ORDER — OXYCODONE HCL 5 MG PO TABS
5.0000 mg | ORAL_TABLET | Freq: Once | ORAL | Status: AC
  Administered 2013-03-12: 5 mg via ORAL

## 2013-03-12 MED ORDER — TECHNETIUM TC 99M SESTAMIBI GENERIC - CARDIOLITE: 30.0000 | Freq: Once | INTRAVENOUS | Status: AC | PRN

## 2013-03-12 MED ORDER — REGADENOSON 0.4 MG/5ML IV SOLN
INTRAVENOUS | Status: AC
  Administered 2013-03-12: 0.4 mg via INTRAVENOUS
  Filled 2013-03-12: qty 5

## 2013-03-12 MED ORDER — TECHNETIUM TC 99M SESTAMIBI GENERIC - CARDIOLITE: 10.0000 | Freq: Once | INTRAVENOUS | Status: AC | PRN

## 2013-03-12 MED ORDER — NITROGLYCERIN 0.2 MG/HR TD PT24
0.2000 mg | MEDICATED_PATCH | TRANSDERMAL | Status: DC
  Administered 2013-03-12 – 2013-03-13 (×2): 0.2 mg via TRANSDERMAL
  Filled 2013-03-12 (×3): qty 1

## 2013-03-12 NOTE — Progress Notes (Signed)
Contacted by RN, RE: chest pain started earlier last night.   BS evaluated pt, c/o chest pain similar to pain on admission. On exam left sided chest wall pain reproducible, patient VS stable, afebrile.  ECG showed NSR with isolated V2 depression <6mm, inf QW and lateral ST/T abnormality, compared to ECG 12/20 (OS[facility]), improved with no more V1-V3 depression and less sig V2 depression. 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   Nitro patch, pain meds given.   Cardiac enzyme sent and came back negative.    Haydee Salter, MD

## 2013-03-12 NOTE — Progress Notes (Signed)
[facility] PROGRESS NOTE  Charles [name] ZOX:[id number] DOB: Jul 19, 1945 DOA: 03/08/2013 PCP: Bernerd Limbo  Assessment/Plan: 67 y/o male with PMH of HTN, HPL, seizure, CAD s/p CABG, CHF, CKD, polysubstance abuse, medication non compliance presented with complaints of intermittent chest pain since 7 AM. He reports that the pain was a 9/10 and in the mid -chest area. He reports having SOB and DOE and weakness when he was walking so he called EMS. When he was seen in the ED, he suffered witnessed seizure and was medicated IV Lorazepam and his laboratory studies returned with sub-therapeutic levels of Dilantin so then he was administered an IV loading dose of Dilantin. Patient reports being out of his medications since 12/08   1. NSTEMI Cocaine induced; cont medical management per cards;  -stress test: No evidence of inducible myocardial ischemia -cont ASA, NG, started Cardizem for HR control; hold BB;   2. Acute on chronic CHF with pulmonary HTN RSVP 76; CXR: cardiomegaly, congestion; patient was not taking meds including lasix;  -echo (03/2010): LVEF 35%, dilated LV, LA, RV; RSVP 76 improved on diuretics; replace lytes; daily I/O, daily weight;   3. CAD s/p CABG; as above   4. COPD with probable pneumonia LLL; productive cough, fever, chills;  -improved on empiric IV atx, bronchodilators; blood c/s NTD  5. HTN not at goal;  -resume home meds, add lisinopril, increased hydralazine 125 on 12/23 titrate per response   6. Seizures- HX , and due to sub-therapeutic Dilantin levels. RE-Loaded with IV Dilantin in ED. Continue Depakote Rx.  Seizure Precautions, and PRN IV Ativan for Seizure Activity.   7. Cocaine Abuse- +UDS, Counseled, PRN IV Ativan for agitation or signs of Withdrawal  8. Anemia chronic; h/o mild GIB s/p EGD (07/2012): mucosal bleeding;  -no s/s of acute bleeding; occult blood test neg (12/20); monitor Hg, TF prn; cont iron; PPI  Possible d/c in AM 12/25  -Other - may  need assistance through Social work to get his medications. wil obtain TB test Code Status: full Family Communication: d/w patient  (indicate person spoken with, relationship, and if by phone, the number) Disposition Plan: pend clinical improvement; cs/ SW homeless issues, meds   Consultants:  Cardiology   Procedures:  None   Antibiotics:  Levofloxacin  (indicate start date, and stop date if known)  HPI/Subjective: alert  Objective: Filed Vitals:   03/12/13 1240  BP: 156/84  Pulse: 101  Temp: 98.3 F (36.8 C)  Resp:     Intake/Output Summary (Last 24 hours) at 03/12/13 1315 Last data filed at 03/11/13 1605  Gross per 24 hour  Intake      0 ml  Output    200 ml  Net   -200 ml   Filed Weights   03/08/13 1740 03/10/13 0356  Weight: 74.844 kg (165 lb) 83.1 kg (183 lb 3.2 oz)    Exam:   General:  alert  Cardiovascular: s1,s2 tachycardia   Respiratory: LL crackles   Abdomen: soft, nt, nd   Musculoskeletal: no LE edema    Data Reviewed: Basic Metabolic Panel:  Recent Labs Lab 03/08/13 1743 03/09/13 0431 03/10/13 0600 03/11/13 0456 03/12/13 0240  NA 138 137 134* 136 134*  K 3.4* 4.5 4.0 4.1 4.0  CL 104 108 104 104 101  CO2 17* 15* 20 22 21   GLUCOSE 85 91 79 78 99  BUN 17 18 18 18 17   CREATININE 1.08 1.14 1.47* 1.28 1.28  CALCIUM 8.9 8.0* 8.0* 8.3* 8.5  Liver Function Tests:  Recent Labs Lab 03/08/13 1853  AST 32  ALT 17  ALKPHOS 69  BILITOT 0.3  PROT 7.2  ALBUMIN 3.2*   No results found for this basename: LIPASE, AMYLASE,  in the last 168 hours No results found for this basename: AMMONIA,  in the last 168 hours CBC:  Recent Labs Lab 03/10/13 2105 03/11/13 0456 03/11/13 2109 03/12/13 0240 03/12/13 0856  WBC 2.8* 3.1* 3.1* 4.7 5.6  HGB 7.6* 8.2* 8.2* 8.5* 9.0*  HCT 23.2* 25.2* 25.3* 26.0* 26.4*  MCV 93.9 93.7 93.7 93.5 92.3  PLT 229 241 246 240 263   Cardiac Enzymes:  Recent Labs Lab 03/08/13 2315 03/09/13 1330  03/09/13 1840 03/10/13 0040 03/12/13 0240  TROPONINI <0.30 0.54* 0.73* 0.69* <0.30   BNP (last 3 results)  Recent Labs  08/06/12 0359 08/13/12 0720 03/08/13 1747  PROBNP 807.8* 862.5* 9784.0*   CBG:  Recent Labs Lab 03/09/13 2141 03/10/13 0812 03/10/13 1234 03/10/13 1701 03/10/13 2052  GLUCAP 93 77 91 89 108*    Recent Results (from the past 240 hour(s))  MRSA PCR SCREENING     Status: Abnormal   Collection Time    03/09/13  1:14 AM      Result Value Range Status   MRSA by PCR POSITIVE (*) NEGATIVE Final   Comment:            The GeneXpert MRSA Assay (FDA     approved for NASAL specimens     only), is one component of a     comprehensive MRSA colonization     surveillance program. It is not     intended to diagnose MRSA     infection nor to guide or     monitor treatment for     MRSA infections.     RESULT CALLED TO, READ BACK BY AND VERIFIED WITH:     YOCUM,R RN [phone] AT 0343 SKEEN,P  CULTURE, BLOOD (ROUTINE X 2)     Status: None   Collection Time    03/09/13  1:20 PM      Result Value Range Status   Specimen Description BLOOD LEFT ARM   Final   Special Requests BOTTLES DRAWN AEROBIC AND ANAEROBIC 10CC   Final   Culture  Setup Time     Final   Value: 03/09/2013 17:55     Performed at [facility]   Culture     Final   Value:        BLOOD CULTURE RECEIVED NO GROWTH TO DATE CULTURE WILL BE HELD FOR 5 DAYS BEFORE ISSUING A FINAL NEGATIVE REPORT     Performed at [facility]   Report Status PENDING   Incomplete  CULTURE, BLOOD (ROUTINE X 2)     Status: None   Collection Time    03/09/13  1:30 PM      Result Value Range Status   Specimen Description BLOOD LEFT HAND   Final   Special Requests BOTTLES DRAWN AEROBIC AND ANAEROBIC 10CC   Final   Culture  Setup Time     Final   Value: 03/09/2013 17:55     Performed at [facility]   Culture     Final   Value:        BLOOD CULTURE RECEIVED NO GROWTH TO DATE CULTURE WILL BE HELD FOR 5  DAYS BEFORE ISSUING A FINAL NEGATIVE REPORT     Performed at [facility]   Report Status PENDING  Incomplete     Studies: Nm Myocar Multi W/spect W/wall Motion / Ef  03/12/2013   CLINICAL DATA:  Chest pain and history of coronary artery disease, prior myocardial infarction, prior CABG in 2009 and CHF.  EXAM: MYOCARDIAL IMAGING WITH SPECT (REST AND PHARMACOLOGIC-STRESS)  GATED LEFT VENTRICULAR WALL MOTION STUDY  LEFT VENTRICULAR EJECTION FRACTION  TECHNIQUE: Standard myocardial SPECT imaging was performed after resting intravenous injection of 10 mCi Tc-74m sestamibi. Subsequently, intravenous infusion of Lexiscan was performed under the supervision of the Cardiology staff. At peak effect of the drug, 30 mCi Tc-17m sestamibi was injected intravenously and standard myocardial SPECT imaging was performed. Quantitative gated imaging was also performed to evaluate left ventricular wall motion, and estimate left ventricular ejection fraction.  COMPARISON:  None.  FINDINGS: Utilizing gated data, the end-diastolic volume is estimated to be 179 mL and the end systolic volume 105 mL. Calculated ejection fraction is 41%.  Gated wall motion analysis shows mild hypokinesis of the inferior wall and septum as well as a component of mild global hypokinesis. No akinetic or dyskinetic segments are identified.  SPECT imaging demonstrates inferior wall scar as well as probable scar at the level of the mid anterior wall. There is no evidence of inducible myocardial ischemia.  IMPRESSION: Inferior and anterior wall scar. No evidence of inducible myocardial ischemia. Quantitative ejection fraction of 41% with septal and inferior hypokinesis present.   Electronically Signed   By: Irish Lack M.D.   On: 03/12/2013 12:49    Scheduled Meds: . aspirin  325 mg Oral Daily  . atorvastatin  20 mg Oral q1800  . Chlorhexidine Gluconate Cloth  6 each Topical Q0600  . cloNIDine  0.1 mg Oral Daily  . diltiazem  120 mg Oral  Daily  . divalproex  500 mg Oral BID  . ferrous sulfate  325 mg Oral BID WC  . furosemide  40 mg Oral Daily  . hydrALAZINE  100 mg Oral Q8H  . isosorbide mononitrate  30 mg Oral Daily  . [START ON 03/13/2013] levofloxacin (LEVAQUIN) IV  750 mg Intravenous Daily  . lisinopril  20 mg Oral Daily  . mupirocin ointment  1 application Nasal BID  . nitroGLYCERIN  0.2 mg Transdermal Q24H  . pantoprazole  40 mg Oral BID  . phenytoin  200 mg Oral Daily   And  . phenytoin  300 mg Oral QHS  . potassium chloride  20 mEq Oral Daily  . spironolactone  50 mg Oral Daily   Continuous Infusions: . sodium chloride Stopped (03/11/13 4782)    Principal Problem:   Chest pain Active Problems:   Hypertension   Chronic systolic heart failure   CAD (coronary artery disease)   Chronic kidney disease (CKD), stage III (moderate)   Convulsions/seizures   Cocaine abuse   NSTEMI (non-ST elevated myocardial infarction)    Time spent: > 35 minutes     Esperanza Sheets  [facility] Pager [phone]. If 7PM-7AM, please contact night-coverage at [email or web address], password [facility] 03/12/2013, 1:15 PM  LOS: 4 days

## 2013-03-12 NOTE — Progress Notes (Addendum)
Cardiologist on call Verdie Mosher informed of pt having 9/10 cp with an EKG reading acute mi, consider RV involvement in the acute inferior infarct. VS in chart. Pt given 1 SL nitro with cp decreasing slightly to 8/10. MD at bedside and new orders were given for a nitrodur 0.2mg  q 24 hrs.  & stat troponin. Will continue to monitor the pt. Sanda Linger, RN

## 2013-03-12 NOTE — Progress Notes (Signed)
    Subjective:  Chest pain last PM; none this AM; no dyspnea.   Objective:  Filed Vitals:   03/12/13 0142 03/12/13 0218 03/12/13 0409 03/12/13 0657  BP: 181/89 168/73 155/70 173/79  Pulse: 82 93 92   Temp: 99 F (37.2 C) 99 F (37.2 C) 99.7 F (37.6 C)   TempSrc: Oral Oral Oral   Resp: 22 20 20    Height:      Weight:      SpO2: 99% 99% 94%     Intake/Output from previous day:  Intake/Output Summary (Last 24 hours) at 03/12/13 1610 Last data filed at 03/11/13 1605  Gross per 24 hour  Intake 573.61 ml  Output    600 ml  Net -26.39 ml    Physical Exam: Physical exam: Well-developed well-nourished in no acute distress.  Skin is warm and dry.  HEENT is normal.  Neck is supple.  Chest is clear to auscultation with normal expansion.  Cardiovascular exam is regular rate and rhythm.  Abdominal exam nontender or distended. No masses palpated. Extremities show no edema. neuro grossly intact    Lab Results: Basic Metabolic Panel:  Recent Labs  96/04/54 0456 03/12/13 0240  NA 136 134*  K 4.1 4.0  CL 104 101  CO2 22 21  GLUCOSE 78 99  BUN 18 17  CREATININE 1.28 1.28  CALCIUM 8.3* 8.5   CBC:  Recent Labs  03/11/13 2109 03/12/13 0240  WBC 3.1* 4.7  HGB 8.2* 8.5*  HCT 25.3* 26.0*  MCV 93.7 93.5  PLT 246 240   Cardiac Enzymes:  Recent Labs  03/09/13 1840 03/10/13 0040 03/12/13 0240  TROPONINI 0.73* 0.69* <0.30     Assessment/Plan:  67 year old male with known CAD, prior CABG in 2009  1. NSTEMI , Cocaine induced, TnI 0.30--> 0.54/.73/.69. Given multiple medical issues including anemia and cocaine, would favor conservative measures if possible. Will plan nuclear study today for risk stratification.  - continue aspirin, statin; no plavix given anemia; no beta blocker given cocaine. 2. Acute on chronic systolic CHF - LVEF 35-40%, Not volume overloaded on exam; continue present dose of lasix and spironolactone. Continue hydralazine/nitrates. Increase  lisinopril for BP. 3. HTN - Will wean clonidine to off given h/o noncompliance and potential rebound HTN if he suddenly stops med. 4. Cocaine Abuse- +UDS, Counseled  5. CKD stage III  6. Pneumonia - on iv ATB  7. Seizures- HX , and due to sub-therapeutic Dilantin levels. RE-Loaded with IV Dilantin in ED.   Olga Millers 03/12/2013, 7:28 AM

## 2013-03-13 MED ORDER — DIVALPROEX SODIUM 500 MG PO DR TAB: 500.0000 mg | DELAYED_RELEASE_TABLET | Freq: Two times a day (BID) | ORAL | Status: DC

## 2013-03-13 MED ORDER — OXYCODONE HCL 5 MG PO TABS: 5.0000 mg | ORAL_TABLET | ORAL | Status: DC | PRN

## 2013-03-13 MED ORDER — FERROUS SULFATE 325 (65 FE) MG PO TABS: 325.0000 mg | ORAL_TABLET | Freq: Two times a day (BID) | ORAL | Status: DC

## 2013-03-13 MED ORDER — ISOSORBIDE MONONITRATE ER 30 MG PO TB24: 30.0000 mg | ORAL_TABLET | Freq: Every day | ORAL | Status: DC

## 2013-03-13 MED ORDER — DIAZEPAM 5 MG PO TABS: 5.0000 mg | ORAL_TABLET | Freq: Four times a day (QID) | ORAL | Status: DC | PRN

## 2013-03-13 MED ORDER — PHENYTOIN SODIUM EXTENDED 100 MG PO CAPS: 200.0000 mg | ORAL_CAPSULE | Freq: Two times a day (BID) | ORAL | Status: DC

## 2013-03-13 MED ORDER — LISINOPRIL 20 MG PO TABS: 20.0000 mg | ORAL_TABLET | Freq: Two times a day (BID) | ORAL | Status: DC

## 2013-03-13 MED ORDER — POTASSIUM CHLORIDE ER 10 MEQ PO TBCR: 10.0000 meq | EXTENDED_RELEASE_TABLET | Freq: Every day | ORAL | Status: DC

## 2013-03-13 MED ORDER — SPIRONOLACTONE 50 MG PO TABS: 50.0000 mg | ORAL_TABLET | Freq: Every day | ORAL | Status: DC

## 2013-03-13 MED ORDER — SIMVASTATIN 40 MG PO TABS: 40.0000 mg | ORAL_TABLET | Freq: Every evening | ORAL | Status: DC

## 2013-03-13 MED ORDER — FUROSEMIDE 40 MG PO TABS: 40.0000 mg | ORAL_TABLET | Freq: Every day | ORAL | Status: DC

## 2013-03-13 MED ORDER — MUPIROCIN 2 % EX OINT: 1.0000 "application " | TOPICAL_OINTMENT | Freq: Two times a day (BID) | CUTANEOUS | Status: DC

## 2013-03-13 MED ORDER — DILTIAZEM HCL ER COATED BEADS 120 MG PO CP24: 120.0000 mg | ORAL_CAPSULE | Freq: Every day | ORAL | Status: DC

## 2013-03-13 MED ORDER — AMLODIPINE BESYLATE 5 MG PO TABS: 5.0000 mg | ORAL_TABLET | Freq: Every day | ORAL | Status: DC

## 2013-03-13 MED ORDER — HYDRALAZINE HCL 100 MG PO TABS: 100.0000 mg | ORAL_TABLET | Freq: Three times a day (TID) | ORAL | Status: DC

## 2013-03-13 NOTE — Discharge Summary (Signed)
Physician Discharge Summary  Charles [name] NFA:[id number] DOB: [birth year] DOA: 03/08/2013  PCP: Bernerd Limbo  Admit date: 03/08/2013 Discharge date: 03/13/2013  Time spent: >35 minutes  Recommendations for Outpatient Follow-up:  F/u with PCP next week F/u with cardiology at [place] in 2 weeks  Discharge Diagnoses:  Principal Problem:   Chest pain Active Problems:   Hypertension   Chronic systolic heart failure   CAD (coronary artery disease)   Chronic kidney disease (CKD), stage III (moderate)   Convulsions/seizures   Cocaine abuse   NSTEMI (non-ST elevated myocardial infarction)   Discharge Condition: stable   Diet recommendation: heart healthy   Filed Weights   03/08/13 1740 03/10/13 0356 03/13/13 0541  Weight: 74.844 kg (165 lb) 83.1 kg (183 lb 3.2 oz) 84 kg (185 lb 3 oz)    History of present illness:  67 y/o male with PMH of HTN, HPL, seizure, CAD s/p CABG, CHF, CKD, polysubstance abuse, medication non compliance presented with complaints of intermittent chest pain since 7 AM. He reports that the pain was a 9/10 and in the mid -chest area. He reports having SOB and DOE and weakness when he was walking so he called EMS. When he was seen in the ED, he suffered witnessed seizure and was medicated IV Lorazepam and his laboratory studies returned with sub-therapeutic levels of Dilantin so then he was administered an IV loading dose of Dilantin. Patient reports being out of his medications since 12/08   Hospital Course:  1. NSTEMI Cocaine induced; stress testt: No evidence of inducible myocardial ischemia  -symptoms resolved; cardiology recommended to cont ASA, NG, started Cardizem for HR control; hold BB due to cocaine use;  2. Acute on chronic CHF with pulmonary HTN RSVP 76; CXR: cardiomegaly, congestion; patient was not taking meds including lasix;  -echo (03/2010): LVEF 35%, dilated LV, LA, RV; RSVP 76  -improved on diuretics; cont outpatient follow up;  3. CAD s/p  CABG; as above  4. COPD with probable pneumonia LLL; productive cough, fever, chills;  -improved on empiric IV atx, bronchodilators; blood c/s NTD  5. HTN improved on lisinopril, increased hydralazine, cardizem titrate per response outpatient  6. Seizures- HX , and due to sub-therapeutic Dilantin levels. RE-Loaded with IV Dilantin in ED. Continue Depakote Rx.  7. Cocaine Abuse- +UDS, Counseled 8. Anemia chronic; h/o mild GIB s/p EGD (07/2012): mucosal bleeding;  -no s/s of acute bleeding; occult blood test neg (12/20);    Procedures:  Stress test as above  (i.e. Studies not automatically included, echos, thoracentesis, etc; not x-rays)  Consultations:  Cardiology   Discharge Exam: Filed Vitals:   03/13/13 0622  BP: 165/101  Pulse:   Temp:   Resp:     General: alert Cardiovascular: s1,s2 rrr Respiratory: CTA BL  Discharge Instructions  Discharge Orders   Future Orders Complete By Expires   Diet - low sodium heart healthy  As directed    Discharge instructions  As directed    Comments:     Please follow up with primary care doctor in 1 week   Increase activity slowly  As directed        Medication List    STOP taking these medications       carvedilol 12.5 MG tablet  Commonly known as:  COREG     cloNIDine 0.2 MG tablet  Commonly known as:  CATAPRES      TAKE these medications       amLODipine 5 MG tablet  Commonly known  as:  NORVASC  Take 1 tablet (5 mg total) by mouth daily.     aspirin 81 MG tablet  Take 81 mg by mouth daily.     diazepam 5 MG tablet  Commonly known as:  VALIUM  Take 1 tablet (5 mg total) by mouth every 6 (six) hours as needed.     diltiazem 120 MG 24 hr capsule  Commonly known as:  CARDIZEM CD  Take 1 capsule (120 mg total) by mouth daily.     divalproex 500 MG DR tablet  Commonly known as:  DEPAKOTE  Take 1 tablet (500 mg total) by mouth 2 (two) times daily.     ferrous sulfate 325 (65 FE) MG tablet  Take 1 tablet (325 mg  total) by mouth 2 (two) times daily with a meal.     furosemide 40 MG tablet  Commonly known as:  LASIX  Take 1 tablet (40 mg total) by mouth daily.     hydrALAZINE 100 MG tablet  Commonly known as:  APRESOLINE  Take 1 tablet (100 mg total) by mouth every 8 (eight) hours.     isosorbide mononitrate 30 MG 24 hr tablet  Commonly known as:  IMDUR  Take 1 tablet (30 mg total) by mouth daily.     lisinopril 20 MG tablet  Commonly known as:  PRINIVIL,ZESTRIL  Take 1 tablet (20 mg total) by mouth 2 (two) times daily.     mupirocin ointment 2 %  Commonly known as:  BACTROBAN  Place 1 application into the nose 2 (two) times daily.     oxyCODONE 5 MG immediate release tablet  Commonly known as:  Oxy IR/ROXICODONE  Take 1 tablet (5 mg total) by mouth every 4 (four) hours as needed for moderate pain.     pantoprazole 40 MG tablet  Commonly known as:  PROTONIX  Take 40 mg by mouth 2 (two) times daily.     phenytoin 100 MG ER capsule  Commonly known as:  DILANTIN  Take 2-3 capsules (200-300 mg total) by mouth 2 (two) times daily. Take 2 capsules by mouth in the morning and 3 capsules by mouth at bedtime.     potassium chloride 10 MEQ tablet  Commonly known as:  K-DUR  Take 1 tablet (10 mEq total) by mouth daily.     simvastatin 40 MG tablet  Commonly known as:  ZOCOR  Take 1 tablet (40 mg total) by mouth every evening.     spironolactone 50 MG tablet  Commonly known as:  ALDACTONE  Take 1 tablet (50 mg total) by mouth daily.       Allergies  Allergen Reactions  . Motrin [Ibuprofen] Hives and Nausea And Vomiting  . Tylenol [Acetaminophen] Hives and Nausea And Vomiting       Follow-up Information   Follow up with Bernerd Limbo. Schedule an appointment as soon as possible for a visit in 1 week.   Specialty:  Family Medicine   Contact information:   [address] [facility] [place] [address] [phone]        The results of  significant diagnostics from this hospitalization (including imaging, microbiology, ancillary and laboratory) are listed below for reference.    Significant Diagnostic Studies: Dg Chest 2 View (if Patient Has Fever And/or Copd)  03/08/2013   CLINICAL DATA:  Chest pain, shortness of Breath  EXAM: CHEST - 2 VIEW  COMPARISON:  10/11/2012  FINDINGS: Previous CABG. Stable mild  cardiomegaly. Patient is rotated towards the left. There is a mild dextroscoliosis of the thoracic spine as before. Central pulmonary vascular congestion. There is some patchy subsegmental atelectasis versus early infiltrate at the left lung base. No definite effusion.  IMPRESSION: 1. New left lower lobe subsegmental atelectasis versus early infiltrate. 2. Stable mild cardiomegaly.   Electronically Signed   By: Oley Balm M.D.   On: 03/08/2013 20:46   Ct Head Wo Contrast  03/08/2013   CLINICAL DATA:  67 year old male with seizure.  EXAM: CT HEAD WITHOUT CONTRAST  TECHNIQUE: Contiguous axial images were obtained from the base of the skull through the vertex without intravenous contrast.  COMPARISON:  03/27/2010 CT  FINDINGS: Atrophy, mild chronic small-vessel white matter ischemic changes left frontal encephalomalacia again noted.  No acute intracranial abnormalities are identified, including mass lesion or mass effect, hydrocephalus, extra-axial fluid collection, midline shift, hemorrhage, or acute infarction. The visualized bony calvarium is unremarkable.  IMPRESSION: No evidence of acute intracranial abnormality.  Small atrophy and chronic small-vessel white matter ischemic changes.   Electronically Signed   By: Laveda Abbe M.D.   On: 03/08/2013 20:40   Ct Angio Chest Pe W/cm &/or Wo Cm  03/08/2013   CLINICAL DATA:  67 year old male with chest pain and shortness of breath.  EXAM: CT ANGIOGRAPHY CHEST WITH CONTRAST  TECHNIQUE: Multidetector CT imaging of the chest was performed using the standard protocol during bolus administration  of intravenous contrast. Multiplanar CT image reconstructions including MIPs were obtained to evaluate the vascular anatomy.  CONTRAST:  OMNIPAQUE IOHEXOL 350 MG/ML SOLN  COMPARISON:  02/2019 and prior chest radiographs  FINDINGS: This is a technically satisfactory study.  No pulmonary emboli are identified.  Ectasia of the thoracic aorta is noted without aneurysm.  Cardiomegaly and cardiac surgical changes are identified.  Trace pleural effusions are present.  No enlarged lymph nodes are identified.  Mild bibasilar atelectasis/scarring noted.  Mild interlobular septal thickening bilaterally is present.  There is no evidence of consolidation, airspace disease or pulmonary nodule/mass.  No acute upper abdominal abnormalities are noted.  A small hiatal hernia is present.  No acute or suspicious bony abnormalities are present.  Review of the MIP images confirms the above findings.  IMPRESSION: No evidence of pulmonary emboli.  Trace pleural effusions with mild bibasilar atelectasis/ scarring.  Mild interlobular septal thickening bilaterally of uncertain chronicity. Mild interstitial edema is not excluded.  Cardiomegaly and ectatic thoracic aorta.  Small hiatal hernia.   Electronically Signed   By: Laveda Abbe M.D.   On: 03/08/2013 23:15   Nm Myocar Multi W/spect W/wall Motion / Ef  03/12/2013   CLINICAL DATA:  Chest pain and history of coronary artery disease, prior myocardial infarction, prior CABG in 2009 and CHF.  EXAM: MYOCARDIAL IMAGING WITH SPECT (REST AND PHARMACOLOGIC-STRESS)  GATED LEFT VENTRICULAR WALL MOTION STUDY  LEFT VENTRICULAR EJECTION FRACTION  TECHNIQUE: Standard myocardial SPECT imaging was performed after resting intravenous injection of 10 mCi Tc-48m sestamibi. Subsequently, intravenous infusion of Lexiscan was performed under the supervision of the Cardiology staff. At peak effect of the drug, 30 mCi Tc-30m sestamibi was injected intravenously and standard myocardial SPECT imaging was  performed. Quantitative gated imaging was also performed to evaluate left ventricular wall motion, and estimate left ventricular ejection fraction.  COMPARISON:  None.  FINDINGS: Utilizing gated data, the end-diastolic volume is estimated to be 179 mL and the end systolic volume 105 mL. Calculated ejection fraction is 41%.  Gated wall motion  analysis shows mild hypokinesis of the inferior wall and septum as well as a component of mild global hypokinesis. No akinetic or dyskinetic segments are identified.  SPECT imaging demonstrates inferior wall scar as well as probable scar at the level of the mid anterior wall. There is no evidence of inducible myocardial ischemia.  IMPRESSION: Inferior and anterior wall scar. No evidence of inducible myocardial ischemia. Quantitative ejection fraction of 41% with septal and inferior hypokinesis present.   Electronically Signed   By: Irish Lack M.D.   On: 03/12/2013 12:49    Microbiology: Recent Results (from the past 240 hour(s))  MRSA PCR SCREENING     Status: Abnormal   Collection Time    03/09/13  1:14 AM      Result Value Range Status   MRSA by PCR POSITIVE (*) NEGATIVE Final   Comment:            The GeneXpert MRSA Assay (FDA     approved for NASAL specimens     only), is one component of a     comprehensive MRSA colonization     surveillance program. It is not     intended to diagnose MRSA     infection nor to guide or     monitor treatment for     MRSA infections.     RESULT CALLED TO, READ BACK BY AND VERIFIED WITH:     YOCUM,R RN [phone] AT 0343 SKEEN,P  CULTURE, BLOOD (ROUTINE X 2)     Status: None   Collection Time    03/09/13  1:20 PM      Result Value Range Status   Specimen Description BLOOD LEFT ARM   Final   Special Requests BOTTLES DRAWN AEROBIC AND ANAEROBIC 10CC   Final   Culture  Setup Time     Final   Value: 03/09/2013 17:55     Performed at [facility]   Culture     Final   Value:        BLOOD CULTURE RECEIVED NO  GROWTH TO DATE CULTURE WILL BE HELD FOR 5 DAYS BEFORE ISSUING A FINAL NEGATIVE REPORT     Performed at [facility]   Report Status PENDING   Incomplete  CULTURE, BLOOD (ROUTINE X 2)     Status: None   Collection Time    03/09/13  1:30 PM      Result Value Range Status   Specimen Description BLOOD LEFT HAND   Final   Special Requests BOTTLES DRAWN AEROBIC AND ANAEROBIC 10CC   Final   Culture  Setup Time     Final   Value: 03/09/2013 17:55     Performed at [facility]   Culture     Final   Value:        BLOOD CULTURE RECEIVED NO GROWTH TO DATE CULTURE WILL BE HELD FOR 5 DAYS BEFORE ISSUING A FINAL NEGATIVE REPORT     Performed at [facility]   Report Status PENDING   Incomplete     Labs: Basic Metabolic Panel:  Recent Labs Lab 03/08/13 1743 03/09/13 0431 03/10/13 0600 03/11/13 0456 03/12/13 0240  NA 138 137 134* 136 134*  K 3.4* 4.5 4.0 4.1 4.0  CL 104 108 104 104 101  CO2 17* 15* 20 22 21   GLUCOSE 85 91 79 78 99  BUN 17 18 18 18 17   CREATININE 1.08 1.14 1.47* 1.28 1.28  CALCIUM 8.9 8.0* 8.0* 8.3* 8.5  Liver Function Tests:  Recent Labs Lab 03/08/13 1853  AST 32  ALT 17  ALKPHOS 69  BILITOT 0.3  PROT 7.2  ALBUMIN 3.2*   No results found for this basename: LIPASE, AMYLASE,  in the last 168 hours No results found for this basename: AMMONIA,  in the last 168 hours CBC:  Recent Labs Lab 03/10/13 2105 03/11/13 0456 03/11/13 2109 03/12/13 0240 03/12/13 0856  WBC 2.8* 3.1* 3.1* 4.7 5.6  HGB 7.6* 8.2* 8.2* 8.5* 9.0*  HCT 23.2* 25.2* 25.3* 26.0* 26.4*  MCV 93.9 93.7 93.7 93.5 92.3  PLT 229 241 246 240 263   Cardiac Enzymes:  Recent Labs Lab 03/08/13 2315 03/09/13 1330 03/09/13 1840 03/10/13 0040 03/12/13 0240  TROPONINI <0.30 0.54* 0.73* 0.69* <0.30   BNP: BNP (last 3 results)  Recent Labs  08/06/12 0359 08/13/12 0720 03/08/13 1747  PROBNP 807.8* 862.5* 9784.0*   CBG:  Recent Labs Lab 03/09/13 2141  03/10/13 0812 03/10/13 1234 03/10/13 1701 03/10/13 2052  GLUCAP 93 77 91 89 108*       Signed:  Cheyenne Bordeaux N  [facility] 03/13/2013, 8:05 AM

## 2013-03-13 NOTE — Progress Notes (Addendum)
    Subjective:  No chest pain; no dyspnea.   Objective:  Filed Vitals:   03/12/13 1240 03/12/13 1648 03/12/13 2152 03/13/13 0124  BP: 156/84 125/56 115/63 118/61  Pulse: 101 87 85 89  Temp: 98.3 F (36.8 C) 99.2 F (37.3 C) 99.3 F (37.4 C) 98.6 F (37 C)  TempSrc: Oral Oral Oral Oral  Resp:  20 18 18   Height:      Weight:      SpO2: 99% 99% 98% 100%    Intake/Output from previous day:  Intake/Output Summary (Last 24 hours) at 03/13/13 0540 Last data filed at 03/12/13 1700  Gross per 24 hour  Intake    480 ml  Output      0 ml  Net    480 ml    Physical Exam: Physical exam: Well-developed well-nourished in no acute distress.  Skin is warm and dry.  HEENT is normal.  Neck is supple.  Chest is clear to auscultation with normal expansion.  Cardiovascular exam is regular rate and rhythm.  Abdominal exam nontender or distended. No masses palpated. Extremities show no edema. neuro grossly intact    Lab Results: Basic Metabolic Panel:  Recent Labs  16/10/96 0456 03/12/13 0240  NA 136 134*  K 4.1 4.0  CL 104 101  CO2 22 21  GLUCOSE 78 99  BUN 18 17  CREATININE 1.28 1.28  CALCIUM 8.3* 8.5   CBC:  Recent Labs  03/12/13 0240 03/12/13 0856  WBC 4.7 5.6  HGB 8.5* 9.0*  HCT 26.0* 26.4*  MCV 93.5 92.3  PLT 240 263   Cardiac Enzymes:  Recent Labs  03/12/13 0240  TROPONINI <0.30     Assessment/Plan:  67 year old male with known CAD, prior CABG in 2009  1. NSTEMI , Cocaine induced, TnI 0.30--> 0.54/.73/.69. Nuclear study shows scar and no ischemia. Plan medical therapy. - continue aspirin, statin; no plavix given anemia; no beta blocker given cocaine. 2. Acute on chronic systolic CHF - LVEF 35-40%, Not volume overloaded on exam; continue present dose of lasix and spironolactone. Continue hydralazine/nitrates. Continue present dose of lisinopril. 3. HTN - Will DC clonidine given h/o noncompliance and potential rebound HTN if he suddenly stops  med. 4. Cocaine Abuse- +UDS, Counseled  5. CKD stage III  6. Pneumonia - per IM 7. Seizures- HX , and due to sub-therapeutic Dilantin levels. RE-Loaded with IV Dilantin in ED. 8. Anemia-Per IM AM labs pending Patient can be DCed from a cardiac standpoint and FU with his cardiologist in [facility]; please call with questions.  Olga Millers 03/13/2013, 5:40 AM

## 2013-03-13 NOTE — Progress Notes (Addendum)
Pt provided with dc instructions and education. Pt verbalized understanding. No questions at this time. IV removed with tip intact. Heart monitor cleaned and returned to front. Levonne Spiller, RN  Pt given all prescriptions. Verbalized understanding and changes in medicatinos. Refuses to take medications prior to dc even though patient educated on possible pharmacy not being open on Christmas Day.

## 2013-03-14 LAB — QUANTIFERON TB GOLD ASSAY (BLOOD): TB Ag value: 0.03 IU/mL

## 2013-03-15 LAB — CULTURE, BLOOD (ROUTINE X 2)

## 2013-03-18 ENCOUNTER — Observation Stay (HOSPITAL_COMMUNITY)
Admission: EM | Admit: 2013-03-18 | Discharge: 2013-03-19 | Disposition: A | Discharge status: Home / Self Care | Payer: Medicare Other | Attending: Internal Medicine | Admitting: Internal Medicine

## 2013-03-18 ENCOUNTER — Emergency Department (HOSPITAL_COMMUNITY): Payer: Medicare Other

## 2013-03-18 ENCOUNTER — Encounter (HOSPITAL_COMMUNITY): Payer: Self-pay | Admitting: Emergency Medicine

## 2013-03-18 DIAGNOSIS — M5137 Other intervertebral disc degeneration, lumbosacral region: Secondary | ICD-10-CM | POA: Insufficient documentation

## 2013-03-18 DIAGNOSIS — Z5900 Homelessness unspecified: Secondary | ICD-10-CM | POA: Insufficient documentation

## 2013-03-18 DIAGNOSIS — I701 Atherosclerosis of renal artery: Secondary | ICD-10-CM | POA: Insufficient documentation

## 2013-03-18 DIAGNOSIS — G40909 Epilepsy, unspecified, not intractable, without status epilepticus: Secondary | ICD-10-CM | POA: Insufficient documentation

## 2013-03-18 DIAGNOSIS — N183 Chronic kidney disease, stage 3 unspecified: Secondary | ICD-10-CM | POA: Insufficient documentation

## 2013-03-18 DIAGNOSIS — F17200 Nicotine dependence, unspecified, uncomplicated: Secondary | ICD-10-CM | POA: Insufficient documentation

## 2013-03-18 DIAGNOSIS — F1410 Cocaine abuse, uncomplicated: Secondary | ICD-10-CM

## 2013-03-18 DIAGNOSIS — Z951 Presence of aortocoronary bypass graft: Secondary | ICD-10-CM | POA: Insufficient documentation

## 2013-03-18 DIAGNOSIS — D509 Iron deficiency anemia, unspecified: Secondary | ICD-10-CM | POA: Insufficient documentation

## 2013-03-18 DIAGNOSIS — I2789 Other specified pulmonary heart diseases: Secondary | ICD-10-CM | POA: Insufficient documentation

## 2013-03-18 DIAGNOSIS — R079 Chest pain, unspecified: Secondary | ICD-10-CM

## 2013-03-18 DIAGNOSIS — I2510 Atherosclerotic heart disease of native coronary artery without angina pectoris: Secondary | ICD-10-CM

## 2013-03-18 DIAGNOSIS — I2589 Other forms of chronic ischemic heart disease: Secondary | ICD-10-CM | POA: Insufficient documentation

## 2013-03-18 DIAGNOSIS — I739 Peripheral vascular disease, unspecified: Secondary | ICD-10-CM | POA: Insufficient documentation

## 2013-03-18 DIAGNOSIS — Z7982 Long term (current) use of aspirin: Secondary | ICD-10-CM | POA: Insufficient documentation

## 2013-03-18 DIAGNOSIS — K922 Gastrointestinal hemorrhage, unspecified: Secondary | ICD-10-CM | POA: Insufficient documentation

## 2013-03-18 DIAGNOSIS — M51379 Other intervertebral disc degeneration, lumbosacral region without mention of lumbar back pain or lower extremity pain: Secondary | ICD-10-CM | POA: Insufficient documentation

## 2013-03-18 DIAGNOSIS — E785 Hyperlipidemia, unspecified: Secondary | ICD-10-CM | POA: Insufficient documentation

## 2013-03-18 DIAGNOSIS — I252 Old myocardial infarction: Secondary | ICD-10-CM | POA: Insufficient documentation

## 2013-03-18 DIAGNOSIS — G4733 Obstructive sleep apnea (adult) (pediatric): Secondary | ICD-10-CM | POA: Insufficient documentation

## 2013-03-18 DIAGNOSIS — I129 Hypertensive chronic kidney disease with stage 1 through stage 4 chronic kidney disease, or unspecified chronic kidney disease: Secondary | ICD-10-CM | POA: Insufficient documentation

## 2013-03-18 DIAGNOSIS — R001 Bradycardia, unspecified: Secondary | ICD-10-CM

## 2013-03-18 DIAGNOSIS — E119 Type 2 diabetes mellitus without complications: Secondary | ICD-10-CM | POA: Insufficient documentation

## 2013-03-18 DIAGNOSIS — Q2733 Arteriovenous malformation of digestive system vessel: Secondary | ICD-10-CM | POA: Insufficient documentation

## 2013-03-18 DIAGNOSIS — Z79899 Other long term (current) drug therapy: Secondary | ICD-10-CM | POA: Insufficient documentation

## 2013-03-18 DIAGNOSIS — F1910 Other psychoactive substance abuse, uncomplicated: Secondary | ICD-10-CM | POA: Diagnosis present

## 2013-03-18 DIAGNOSIS — I5023 Acute on chronic systolic (congestive) heart failure: Secondary | ICD-10-CM | POA: Insufficient documentation

## 2013-03-18 DIAGNOSIS — R0789 Other chest pain: Principal | ICD-10-CM | POA: Insufficient documentation

## 2013-03-18 LAB — BASIC METABOLIC PANEL
BUN: 22 mg/dL (ref 6–23)
Chloride: 109 mEq/L (ref 96–112)
Creatinine, Ser: 1.12 mg/dL (ref 0.50–1.35)
GFR calc Af Amer: 76 mL/min — ABNORMAL LOW (ref >90)
GFR calc non Af Amer: 65 mL/min — ABNORMAL LOW (ref >90)

## 2013-03-18 LAB — CBC
HCT: 22.2 % — ABNORMAL LOW (ref 39.0–52.0)
Hemoglobin: 7.2 g/dL — ABNORMAL LOW (ref 13.0–17.0)
MCHC: 32.4 g/dL (ref 30.0–36.0)
MCV: 94.1 fL (ref 78.0–100.0)
Platelets: 497 10*3/uL — ABNORMAL HIGH (ref 150–400)
RBC: 2.36 MIL/uL — ABNORMAL LOW (ref 4.22–5.81)
RDW: 15.1 % (ref 11.5–15.5)
WBC: 4 10*3/uL (ref 4.0–10.5)

## 2013-03-18 LAB — POCT I-STAT TROPONIN I: Troponin i, poc: 0.03 ng/mL (ref 0.00–0.08)

## 2013-03-18 LAB — PRO B NATRIURETIC PEPTIDE: Pro B Natriuretic peptide (BNP): 2997 pg/mL — ABNORMAL HIGH (ref 0–125)

## 2013-03-18 LAB — RAPID URINE DRUG SCREEN, HOSP PERFORMED
Amphetamines: NOT DETECTED
Barbiturates: NOT DETECTED
Opiates: NOT DETECTED
Tetrahydrocannabinol: NOT DETECTED

## 2013-03-18 MED ORDER — ONDANSETRON HCL 4 MG/2ML IJ SOLN
4.0000 mg | Freq: Once | INTRAMUSCULAR | Status: AC
  Administered 2013-03-18: 4 mg via INTRAVENOUS
  Filled 2013-03-18: qty 2

## 2013-03-18 MED ORDER — MORPHINE SULFATE 4 MG/ML IJ SOLN
4.0000 mg | Freq: Once | INTRAMUSCULAR | Status: AC
  Administered 2013-03-18: 4 mg via INTRAVENOUS
  Filled 2013-03-18: qty 1

## 2013-03-18 MED ORDER — NITROGLYCERIN 0.4 MG SL SUBL
0.4000 mg | SUBLINGUAL_TABLET | SUBLINGUAL | Status: DC | PRN
  Administered 2013-03-18: 0.4 mg via SUBLINGUAL
  Filled 2013-03-18: qty 25

## 2013-03-18 NOTE — ED Provider Notes (Signed)
CSN: [id number]     Arrival date & time 03/18/13  1916 History   First MD Initiated Contact with Patient 03/18/13 1930     Chief Complaint  Patient presents with  . Chest Pain   (Consider location/radiation/quality/duration/timing/severity/associated sxs/prior Treatment) HPI Comments: She presents to the ER for evaluation of chest pain. Patient reports that he had sudden onset of sharp pain in the center of his chest accompanied by a tightness and squeezing. He was resting when this occurred. Symptoms began about one hour before coming to the ER. He is not short of breath. No nausea or diaphoresis. Patient indicates that he was admitted for a "light heart attack" several days ago. Symptoms feel the same.  Patient is a 67 y.o. male presenting with chest pain.  Chest Pain   Past Medical History  Diagnosis Date  . Iron deficiency anemia   . Hypertension   . Chronic systolic heart failure     2D Echo (03/2010) - EF 35-40% with akinesis of inferoposterior myocardium  . CAD (coronary artery disease)     s/p 3-vessel CABG (12/2007) // 100% RCA stenosis with collaterals from left system. Severe bifurcation lesions of proxima CXA and OM. Moderate LAD diseaseFollowed by Dr. Rhona Leavens in [facility]  . Hyperlipidemia LDL goal <70   . Obstructive sleep apnea     not on home CPAP  . Ischemic cardiomyopathy   . Chronic kidney disease (CKD), stage III (moderate)     BL SCr 1.5-1.6  . Homelessness   . 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 // Colonoscopy and EGD in 03/2010 negative per report (record not found)  . 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 Polysubstance abuse     cocaine, THC  . AV malformation of gastrointestinal tract   . Family history of early CAD   . DDD (degenerative disc disease), lumbar   . Renal artery stenosis   .  Peripheral vascular disease   . Seizure disorder   . Diabetes   . Acute on chronic systolic CHF (congestive heart failure) 08/13/2012  . Myocardial infarction   . Seizures    Past Surgical History  Procedure Laterality Date  . Coronary artery bypass graft  12/2007  . Apc  03/2010    To treat small bowel AVMs  . Esophagogastroduodenoscopy N/A 08/14/2012    Procedure: ESOPHAGOGASTRODUODENOSCOPY (EGD);  Surgeon: Charna Elizabeth, MD;  Location: [facility] ENDOSCOPY;  Service: Endoscopy;  Laterality: N/A;  . Hot hemostasis N/A 08/14/2012    Procedure: HOT HEMOSTASIS (ARGON PLASMA COAGULATION/BICAP);  Surgeon: Charna Elizabeth, MD;  Location: [facility] ENDOSCOPY;  Service: Endoscopy;  Laterality: N/A;   Family History  Problem Relation Age of Onset  . Heart disease Mother     unknown type  . Heart disease Father 73    died of MI at 65yo  . Heart disease Paternal Grandfather 42    died of MI  . Heart disease    . Heart disease Brother 30   History  Substance Use Topics  . Smoking status: Current Every Day Smoker -- 0.20 packs/day for 13 years    Types: Cigarettes    Last Attempt to Quit: 03/20/1997  . Smokeless tobacco: Never Used  . Alcohol Use: Yes     Comment: occasionally drinks, a few times a month  Review of Systems  Cardiovascular: Positive for chest pain.  All other systems reviewed and are negative.    Allergies  Motrin and Tylenol  Home Medications   Current Outpatient Rx  Name  Route  Sig  Dispense  Refill  . amLODipine (NORVASC) 5 MG tablet   Oral   Take 1 tablet (5 mg total) by mouth daily.   30 tablet   2   . aspirin 81 MG tablet   Oral   Take 81 mg by mouth daily.         . diazepam (VALIUM) 5 MG tablet   Oral   Take 1 tablet (5 mg total) by mouth every 6 (six) hours as needed.   10 tablet   0   . diltiazem (CARDIZEM CD) 120 MG 24 hr capsule   Oral   Take 1 capsule (120 mg total) by mouth daily.   60 capsule   1   . divalproex (DEPAKOTE) 500 MG DR tablet    Oral   Take 1 tablet (500 mg total) by mouth 2 (two) times daily.   60 tablet   1   . ferrous sulfate 325 (65 FE) MG tablet   Oral   Take 1 tablet (325 mg total) by mouth 2 (two) times daily with a meal.   30 tablet   3   . furosemide (LASIX) 40 MG tablet   Oral   Take 1 tablet (40 mg total) by mouth daily.   30 tablet   3   . hydrALAZINE (APRESOLINE) 100 MG tablet   Oral   Take 1 tablet (100 mg total) by mouth every 8 (eight) hours.   90 tablet   2   . isosorbide mononitrate (IMDUR) 30 MG 24 hr tablet   Oral   Take 1 tablet (30 mg total) by mouth daily.   30 tablet   1   . lisinopril (PRINIVIL,ZESTRIL) 20 MG tablet   Oral   Take 1 tablet (20 mg total) by mouth 2 (two) times daily.   30 tablet   1   . mupirocin ointment (BACTROBAN) 2 %   Nasal   Place 1 application into the nose 2 (two) times daily.   22 g   0   . oxyCODONE (OXY IR/ROXICODONE) 5 MG immediate release tablet   Oral   Take 1 tablet (5 mg total) by mouth every 4 (four) hours as needed for moderate pain.   30 tablet   0   . pantoprazole (PROTONIX) 40 MG tablet   Oral   Take 40 mg by mouth 2 (two) times daily.         . phenytoin (DILANTIN) 100 MG ER capsule   Oral   Take 2-3 capsules (200-300 mg total) by mouth 2 (two) times daily. Take 2 capsules by mouth in the morning and 3 capsules by mouth at bedtime.   90 capsule   2   . potassium chloride (K-DUR) 10 MEQ tablet   Oral   Take 1 tablet (10 mEq total) by mouth daily.   30 tablet   1   . simvastatin (ZOCOR) 40 MG tablet   Oral   Take 1 tablet (40 mg total) by mouth every evening.   30 tablet   1   . spironolactone (ALDACTONE) 50 MG tablet   Oral   Take 1 tablet (50 mg total) by mouth daily.   30 tablet   2    BP 163/107  Pulse 84  Temp(Src) 98.6 F (37 C) (Oral)  Resp 20  SpO2 98% Physical Exam  Constitutional: He is oriented to person, place, and time. He appears well-developed and well-nourished. No distress.  HENT:   Head: Normocephalic and atraumatic.  Right Ear: Hearing normal.  Left Ear: Hearing normal.  Nose: Nose normal.  Mouth/Throat: Oropharynx is clear and moist and mucous membranes are normal.  Eyes: Conjunctivae and EOM are normal. Pupils are equal, round, and reactive to light.  Neck: Normal range of motion. Neck supple.  Cardiovascular: Regular rhythm, S1 normal and S2 normal.  Exam reveals no gallop and no friction rub.   No murmur heard. Pulmonary/Chest: Effort normal and breath sounds normal. No respiratory distress. He exhibits no tenderness.  Abdominal: Soft. Normal appearance and bowel sounds are normal. There is no hepatosplenomegaly. There is no tenderness. There is no rebound, no guarding, no tenderness at McBurney's point and negative Murphy's sign. No hernia.  Musculoskeletal: Normal range of motion.  Neurological: He is alert and oriented to person, place, and time. He has normal strength. No cranial nerve deficit or sensory deficit. Coordination normal. GCS eye subscore is 4. GCS verbal subscore is 5. GCS motor subscore is 6.  Skin: Skin is warm, dry and intact. No rash noted. No cyanosis.  Psychiatric: He has a normal mood and affect. His speech is normal and behavior is normal. Thought content normal.    ED Course  Procedures (including critical care time) Labs Review Labs Reviewed  CBC - Abnormal; Notable for the following:    RBC 2.36 (*)    Hemoglobin 7.2 (*)    HCT 22.2 (*)    Platelets 497 (*)    All other components within normal limits  BASIC METABOLIC PANEL - Abnormal; Notable for the following:    Calcium 7.9 (*)    GFR calc non Af Amer 65 (*)    GFR calc Af Amer 76 (*)    All other components within normal limits  PRO B NATRIURETIC PEPTIDE - Abnormal; Notable for the following:    Pro B Natriuretic peptide (BNP) 2997.0 (*)    All other components within normal limits  URINE RAPID DRUG SCREEN (HOSP PERFORMED)  POCT I-STAT TROPONIN I   Imaging Review Dg  Chest 2 View  03/18/2013   CLINICAL DATA:  Left sided chest pain.  EXAM: CHEST  2 VIEW  COMPARISON:  03/08/2013  FINDINGS: Prior CABG. Mild cardiomegaly. No confluent opacities or edema. No effusions or acute bony abnormality. Mild vascular congestion.  IMPRESSION: Cardiomegaly with vascular congestion.  No overt edema.   Electronically Signed   By: Charlett Nose M.D.   On: 03/18/2013 20:10    EKG Interpretation   None       Date: 03/18/2013  Rate: 72  Rhythm: normal sinus rhythm  QRS Axis: normal  Intervals: QT prolonged  ST/T Wave abnormalities: nonspecific ST/T changes  Conduction Disutrbances:none  Narrative Interpretation:   Old EKG Reviewed: unchanged    MDM  Diagnosis: Chest pain  Patient presents to the ER for evaluation of chest pain. Patient reports that he was discharged from hospital 5 days ago after being admitted for a heart attack. His pain today is similar to what he experienced with his previous hospitalization. The records reveals that he did have a non-ST elevation MI which was felt to be secondary to cocaine abuse. Patient has a negative drug screen today, however. His initial troponin was negative, EKG nonspecific but unchanged.  Case discussed with Doctor  Balfour, on call for cardiology. As the patient did have a Myoview during his previous hospitalization that did not show any reversible ischemia, he did not feel this required cardiology admission. He recommends hospitalist admission with cardiology consult if troponins elevate.    Gilda Crease, MD 03/18/13 2322

## 2013-03-18 NOTE — ED Notes (Signed)
Woke pt from sleeping. Pt reports pain 7/10. Immediately after assessing pain. Pt feel asleep with snoring respirations.

## 2013-03-18 NOTE — ED Notes (Signed)
Per EMS: pt coming from home with c/o left side non radiating chest pain. Pt reports pain onset was sharp and sudden around 1800 tonight. Pt reports some shortness of breath and increase pain with inspiration and expiration. Pt took 324 asa and was given 0.4 nitro X 1 en route by EMS. Pt has a productive cough. Pt has no other associative symptoms. Pt is A&Ox4, respirations equal and unlabored, skin warm and dry.

## 2013-03-19 ENCOUNTER — Encounter (HOSPITAL_COMMUNITY): Payer: Self-pay | Admitting: Internal Medicine

## 2013-03-19 DIAGNOSIS — F1410 Cocaine abuse, uncomplicated: Secondary | ICD-10-CM

## 2013-03-19 DIAGNOSIS — R079 Chest pain, unspecified: Secondary | ICD-10-CM

## 2013-03-19 DIAGNOSIS — R072 Precordial pain: Secondary | ICD-10-CM

## 2013-03-19 DIAGNOSIS — I2510 Atherosclerotic heart disease of native coronary artery without angina pectoris: Secondary | ICD-10-CM

## 2013-03-19 DIAGNOSIS — I509 Heart failure, unspecified: Secondary | ICD-10-CM

## 2013-03-19 DIAGNOSIS — I498 Other specified cardiac arrhythmias: Secondary | ICD-10-CM

## 2013-03-19 DIAGNOSIS — I5023 Acute on chronic systolic (congestive) heart failure: Secondary | ICD-10-CM

## 2013-03-19 LAB — GLUCOSE, CAPILLARY
Glucose-Capillary: 83 mg/dL (ref 70–99)
Glucose-Capillary: 128 mg/dL — ABNORMAL HIGH (ref 70–99)

## 2013-03-19 LAB — CREATININE, SERUM
Creatinine, Ser: 1.13 mg/dL (ref 0.50–1.35)
GFR calc non Af Amer: 64 mL/min — ABNORMAL LOW (ref >90)

## 2013-03-19 LAB — CBC
Hemoglobin: 8.3 g/dL — ABNORMAL LOW (ref 13.0–17.0)
MCHC: 31.2 g/dL (ref 30.0–36.0)
MCV: 93.3 fL (ref 78.0–100.0)
Platelets: 510 10*3/uL — ABNORMAL HIGH (ref 150–400)
RBC: 2.85 MIL/uL — ABNORMAL LOW (ref 4.22–5.81)
RDW: 15.9 % — ABNORMAL HIGH (ref 11.5–15.5)
WBC: 4.9 10*3/uL (ref 4.0–10.5)

## 2013-03-19 LAB — PHENYTOIN LEVEL, TOTAL: Phenytoin Lvl: <2.5 ug/mL — ABNORMAL LOW (ref 10.0–20.0)

## 2013-03-19 LAB — TROPONIN I
Troponin I: <0.3 ng/mL (ref <0.30)
Troponin I: <0.3 ng/mL (ref <0.30)

## 2013-03-19 LAB — PREPARE RBC (CROSSMATCH)

## 2013-03-19 LAB — VALPROIC ACID LEVEL: Valproic Acid Lvl: <10 ug/mL — ABNORMAL LOW (ref 50.0–100.0)

## 2013-03-19 MED ORDER — SODIUM CHLORIDE 0.9 % IJ SOLN
3.0000 mL | Freq: Two times a day (BID) | INTRAMUSCULAR | Status: DC
  Administered 2013-03-19: 3 mL via INTRAVENOUS

## 2013-03-19 MED ORDER — ONDANSETRON HCL 4 MG/2ML IJ SOLN: 4.0000 mg | Freq: Four times a day (QID) | INTRAMUSCULAR | Status: DC | PRN

## 2013-03-19 MED ORDER — ONDANSETRON HCL 4 MG PO TABS: 4.0000 mg | ORAL_TABLET | Freq: Four times a day (QID) | ORAL | Status: DC | PRN

## 2013-03-19 MED ORDER — DIAZEPAM 5 MG PO TABS: 5.0000 mg | ORAL_TABLET | Freq: Four times a day (QID) | ORAL | Status: DC | PRN

## 2013-03-19 MED ORDER — HYDRALAZINE HCL 50 MG PO TABS
100.0000 mg | ORAL_TABLET | Freq: Three times a day (TID) | ORAL | Status: DC
  Administered 2013-03-19 (×2): 100 mg via ORAL
  Filled 2013-03-19 (×5): qty 2

## 2013-03-19 MED ORDER — NITROGLYCERIN 2 % TD OINT
1.0000 [in_us] | TOPICAL_OINTMENT | Freq: Four times a day (QID) | TRANSDERMAL | Status: DC
  Administered 2013-03-19 (×3): 1 [in_us] via TOPICAL
  Filled 2013-03-19: qty 30

## 2013-03-19 MED ORDER — SIMVASTATIN 40 MG PO TABS
40.0000 mg | ORAL_TABLET | Freq: Every evening | ORAL | Status: DC
  Filled 2013-03-19: qty 1

## 2013-03-19 MED ORDER — PANTOPRAZOLE SODIUM 40 MG PO TBEC
40.0000 mg | DELAYED_RELEASE_TABLET | Freq: Two times a day (BID) | ORAL | Status: DC
  Administered 2013-03-19: 40 mg via ORAL

## 2013-03-19 MED ORDER — SPIRONOLACTONE 50 MG PO TABS
50.0000 mg | ORAL_TABLET | Freq: Every day | ORAL | Status: DC
  Administered 2013-03-19: 50 mg via ORAL
  Filled 2013-03-19: qty 1

## 2013-03-19 MED ORDER — PHENYTOIN SODIUM EXTENDED 100 MG PO CAPS
300.0000 mg | ORAL_CAPSULE | Freq: Every day | ORAL | Status: DC
  Administered 2013-03-19: 300 mg via ORAL
  Filled 2013-03-19 (×2): qty 3

## 2013-03-19 MED ORDER — PHENYTOIN SODIUM EXTENDED 100 MG PO CAPS
200.0000 mg | ORAL_CAPSULE | Freq: Every day | ORAL | Status: DC
  Administered 2013-03-19: 200 mg via ORAL
  Filled 2013-03-19: qty 2

## 2013-03-19 MED ORDER — MUPIROCIN 2 % EX OINT
1.0000 "application " | TOPICAL_OINTMENT | Freq: Two times a day (BID) | CUTANEOUS | Status: DC
  Administered 2013-03-19: 1 via NASAL
  Filled 2013-03-19: qty 22

## 2013-03-19 MED ORDER — DIVALPROEX SODIUM 500 MG PO DR TAB
500.0000 mg | DELAYED_RELEASE_TABLET | Freq: Two times a day (BID) | ORAL | Status: DC
  Administered 2013-03-19 (×2): 500 mg via ORAL
  Filled 2013-03-19 (×3): qty 1

## 2013-03-19 MED ORDER — POTASSIUM CHLORIDE ER 10 MEQ PO TBCR
10.0000 meq | EXTENDED_RELEASE_TABLET | Freq: Every day | ORAL | Status: DC
  Administered 2013-03-19: 10 meq via ORAL
  Filled 2013-03-19: qty 1

## 2013-03-19 MED ORDER — LISINOPRIL 20 MG PO TABS
20.0000 mg | ORAL_TABLET | Freq: Two times a day (BID) | ORAL | Status: DC
  Administered 2013-03-19: 20 mg via ORAL
  Filled 2013-03-19 (×3): qty 1

## 2013-03-19 MED ORDER — INSULIN ASPART 100 UNIT/ML ~~LOC~~ SOLN: 0.0000 [IU] | Freq: Every day | SUBCUTANEOUS | Status: DC

## 2013-03-19 MED ORDER — SODIUM CHLORIDE 0.9 % IJ SOLN: 3.0000 mL | INTRAMUSCULAR | Status: DC | PRN

## 2013-03-19 MED ORDER — FERROUS SULFATE 325 (65 FE) MG PO TABS
325.0000 mg | ORAL_TABLET | Freq: Two times a day (BID) | ORAL | Status: DC
  Administered 2013-03-19: 325 mg via ORAL
  Filled 2013-03-19 (×4): qty 1

## 2013-03-19 MED ORDER — AMLODIPINE BESYLATE 5 MG PO TABS
5.0000 mg | ORAL_TABLET | Freq: Every day | ORAL | Status: DC
  Administered 2013-03-19: 5 mg via ORAL
  Filled 2013-03-19: qty 1

## 2013-03-19 MED ORDER — ASPIRIN 81 MG PO CHEW
81.0000 mg | CHEWABLE_TABLET | Freq: Every day | ORAL | Status: DC
  Filled 2013-03-19: qty 1

## 2013-03-19 MED ORDER — ISOSORBIDE MONONITRATE ER 30 MG PO TB24
30.0000 mg | ORAL_TABLET | Freq: Every day | ORAL | Status: DC
  Administered 2013-03-19: 30 mg via ORAL
  Filled 2013-03-19: qty 1

## 2013-03-19 MED ORDER — PANTOPRAZOLE SODIUM 40 MG PO TBEC: 40.0000 mg | DELAYED_RELEASE_TABLET | Freq: Two times a day (BID) | ORAL | Status: DC

## 2013-03-19 MED ORDER — INSULIN ASPART 100 UNIT/ML ~~LOC~~ SOLN: 0.0000 [IU] | Freq: Three times a day (TID) | SUBCUTANEOUS | Status: DC

## 2013-03-19 MED ORDER — MORPHINE SULFATE 2 MG/ML IJ SOLN
2.0000 mg | INTRAMUSCULAR | Status: DC | PRN
  Administered 2013-03-19: 4 mg via INTRAVENOUS
  Filled 2013-03-19: qty 2

## 2013-03-19 MED ORDER — SODIUM CHLORIDE 0.9 % IV SOLN: 250.0000 mL | INTRAVENOUS | Status: DC | PRN

## 2013-03-19 MED ORDER — FUROSEMIDE 10 MG/ML IJ SOLN
20.0000 mg | Freq: Once | INTRAMUSCULAR | Status: AC
  Administered 2013-03-19: 20 mg via INTRAVENOUS
  Filled 2013-03-19: qty 2

## 2013-03-19 MED ORDER — HEPARIN SODIUM (PORCINE) 5000 UNIT/ML IJ SOLN
5000.0000 [IU] | Freq: Three times a day (TID) | INTRAMUSCULAR | Status: DC
  Filled 2013-03-19 (×4): qty 1

## 2013-03-19 MED ORDER — DOCUSATE SODIUM 100 MG PO CAPS
100.0000 mg | ORAL_CAPSULE | Freq: Two times a day (BID) | ORAL | Status: DC
  Administered 2013-03-19 (×2): 100 mg via ORAL
  Filled 2013-03-19 (×3): qty 1

## 2013-03-19 MED ORDER — FUROSEMIDE 40 MG PO TABS
40.0000 mg | ORAL_TABLET | Freq: Every day | ORAL | Status: DC
  Administered 2013-03-19: 40 mg via ORAL
  Filled 2013-03-19: qty 1

## 2013-03-19 MED ORDER — DILTIAZEM HCL ER COATED BEADS 120 MG PO CP24
120.0000 mg | ORAL_CAPSULE | Freq: Every day | ORAL | Status: DC
  Administered 2013-03-19: 120 mg via ORAL
  Filled 2013-03-19: qty 1

## 2013-03-19 NOTE — Progress Notes (Signed)
UR completed 

## 2013-03-19 NOTE — H&P (Signed)
[facility] History and Physical  Charles [name] ZOX:[id number] DOB: May 10, 1945    PCP:   Bernerd Limbo   Chief Complaint: chest pain.  HPI: Charles [name] is an 67 y.o. male  with Multiple Medical problems including CAD S/P CABG in 2009 (s/p 3-vessel CABG (12/2007) // 100% RCA stenosis with collaterals from left system. Severe bifurcation lesions of proxima CXA and OM. Moderate LAD diseaseFollowed by Dr. [name]), CHF, LVEF 35-40%,who presents to the ED with complaints of intermittent chest pain again.  He has substernal CP, with no shortness of breath, nausea or vomiting.  He denied black or bloody stool.  He was admitted last week for a NSTEMI, and was doing well until the chest pain started again this am.  He has hx of GI bleed, and has chronic iron deficiency anemia.  He was using cocaine and because of it, betablocker was not given.  Evaluation tonight with EKG showing NSR no acute ST-T changes.  He has a Hb of 7.2  grams per dL, negative troponin. His Cr was 1.2 and electrolytes were normal.  He has hx of seizure and had a seizure during his last hospitalization.  He did have a stress test after his NSTEMI, which showed no reversible ischemia.  Hospitalist was asked to admit him for r/out again.   Rewiew of Systems: . Constitutional: Negative for malaise, fever and chills. No significant weight loss or weight gain Eyes: Negative for eye pain, redness and discharge, diplopia, visual changes, or flashes of light. ENMT: Negative for ear pain, hoarseness, nasal congestion, sinus pressure and sore throat. No headaches; tinnitus, drooling, or problem swallowing. Cardiovascular: Negative for palpitations, diaphoresis,  and peripheral edema. ; No orthopnea, PND Respiratory: Negative for cough, hemoptysis, wheezing and stridor. No pleuritic chestpain. Gastrointestinal: Negative for nausea, vomiting, diarrhea, constipation, abdominal pain, melena, blood in stool,  hematemesis, jaundice and rectal bleeding.    Genitourinary: Negative for frequency, dysuria, incontinence,flank pain and hematuria; Musculoskeletal: Negative for back pain and neck pain. Negative for swelling and trauma.;  Skin: . Negative for pruritus, rash, abrasions, bruising and skin lesion.; ulcerations Neuro: Negative for headache, lightheadedness and neck stiffness. Negative for weakness, altered level of consciousness , altered mental status, extremity weakness, burning feet, involuntary movement, seizure and syncope.  Psych: negative for anxiety, depression, insomnia, tearfulness, panic attacks, hallucinations, paranoia, suicidal or homicidal ideation    Past Medical History  Diagnosis Date  . Iron deficiency anemia   . Hypertension   . Chronic systolic heart failure     2D Echo (03/2010) - EF 35-40% with akinesis of inferoposterior myocardium  . CAD (coronary artery disease)     s/p 3-vessel CABG (12/2007) // 100% RCA stenosis with collaterals from left system. Severe bifurcation lesions of proxima CXA and OM. Moderate LAD diseaseFollowed by Dr. [name]  . Hyperlipidemia LDL goal <70   . Obstructive sleep apnea     not on home CPAP  . Ischemic cardiomyopathy   . Chronic kidney disease (CKD), stage III (moderate)     BL SCr 1.5-1.6  . Homelessness   . 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 // Colonoscopy and EGD in 03/2010 negative per report (record not found)  . 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 Polysubstance abuse  cocaine, THC  . AV malformation of gastrointestinal tract   . Family history of early CAD   . DDD (degenerative disc disease), lumbar   . Renal artery stenosis   . Peripheral vascular disease   . Seizure disorder   . Diabetes   . Acute on chronic systolic CHF (congestive heart  failure) 08/13/2012  . Myocardial infarction   . Seizures     Past Surgical History  Procedure Laterality Date  . Coronary artery bypass graft  12/2007  . Apc  03/2010    To treat small bowel AVMs  . Esophagogastroduodenoscopy N/A 08/14/2012    Procedure: ESOPHAGOGASTRODUODENOSCOPY (EGD);  Surgeon: Charna Elizabeth, MD;  Location: [facility] ENDOSCOPY;  Service: Endoscopy;  Laterality: N/A;  . Hot hemostasis N/A 08/14/2012    Procedure: HOT HEMOSTASIS (ARGON PLASMA COAGULATION/BICAP);  Surgeon: Charna Elizabeth, MD;  Location: [facility] ENDOSCOPY;  Service: Endoscopy;  Laterality: N/A;    Medications:  HOME MEDS: Prior to Admission medications   Medication Sig Start Date End Date Taking? Authorizing Provider  amLODipine (NORVASC) 5 MG tablet Take 1 tablet (5 mg total) by mouth daily. 03/13/13   Esperanza Sheets, MD  aspirin 81 MG tablet Take 81 mg by mouth daily.    Historical Provider, MD  diazepam (VALIUM) 5 MG tablet Take 5 mg by mouth every 6 (six) hours as needed for anxiety.    Historical Provider, MD  diltiazem (CARDIZEM CD) 120 MG 24 hr capsule Take 1 capsule (120 mg total) by mouth daily. 03/13/13   Esperanza Sheets, MD  divalproex (DEPAKOTE) 500 MG DR tablet Take 1 tablet (500 mg total) by mouth 2 (two) times daily. 03/13/13   Esperanza Sheets, MD  ferrous sulfate 325 (65 FE) MG tablet Take 1 tablet (325 mg total) by mouth 2 (two) times daily with a meal. 03/13/13   Esperanza Sheets, MD  furosemide (LASIX) 40 MG tablet Take 1 tablet (40 mg total) by mouth daily. 03/13/13   Esperanza Sheets, MD  hydrALAZINE (APRESOLINE) 100 MG tablet Take 1 tablet (100 mg total) by mouth every 8 (eight) hours. 03/13/13   Esperanza Sheets, MD  isosorbide mononitrate (IMDUR) 30 MG 24 hr tablet Take 1 tablet (30 mg total) by mouth daily. 03/13/13   Esperanza Sheets, MD  lisinopril (PRINIVIL,ZESTRIL) 20 MG tablet Take 1 tablet (20 mg total) by mouth 2 (two) times daily. 03/13/13   Esperanza Sheets, MD  mupirocin ointment  (BACTROBAN) 2 % Place 1 application into the nose 2 (two) times daily. 03/13/13   Esperanza Sheets, MD  oxyCODONE (OXY IR/ROXICODONE) 5 MG immediate release tablet Take 1 tablet (5 mg total) by mouth every 4 (four) hours as needed for moderate pain. 03/13/13   Esperanza Sheets, MD  pantoprazole (PROTONIX) 40 MG tablet Take 40 mg by mouth 2 (two) times daily.    Historical Provider, MD  phenytoin (DILANTIN) 100 MG ER capsule Take 2-3 capsules (200-300 mg total) by mouth 2 (two) times daily. Take 2 capsules by mouth in the morning and 3 capsules by mouth at bedtime. 03/13/13   Esperanza Sheets, MD  potassium chloride (K-DUR) 10 MEQ tablet Take 1 tablet (10 mEq total) by mouth daily. 03/13/13   Esperanza Sheets, MD  simvastatin (ZOCOR) 40 MG tablet Take 1 tablet (40 mg total) by mouth every evening. 03/13/13   Esperanza Sheets, MD  spironolactone (ALDACTONE) 50 MG tablet Take 1 tablet (50 mg total) by mouth daily. 03/13/13  Esperanza Sheets, MD     Allergies:  Allergies  Allergen Reactions  . Motrin [Ibuprofen] Other (See Comments)    Affects kidneys  . Tylenol [Acetaminophen] Other (See Comments)    Affects kidneys    Social History:   reports that he has been smoking Cigarettes.  He has a 2.6 pack-year smoking history. He has never used smokeless tobacco. He reports that he drinks alcohol. He reports that he uses illicit drugs (Cocaine and Marijuana).  Family History: Family History  Problem Relation Age of Onset  . Heart disease Mother     unknown type  . Heart disease Father 44    died of MI at 26yo  . Heart disease Paternal Grandfather 92    died of MI  . Heart disease    . Heart disease Brother 30     Physical Exam: Filed Vitals:   03/18/13 2045 03/18/13 2200 03/18/13 2245 03/18/13 2257  BP: 161/87 174/87 179/85 174/85  Pulse: 71 64 65 95  Temp:      TempSrc:      Resp: 19 20 17 20   SpO2: 98% 98% 98% 99%   Blood pressure 174/85, pulse 95, temperature 98.6 F (37 C),  temperature source Oral, resp. rate 20, SpO2 99.00%.  GEN:  Pleasant  patient lying in the stretcher in no acute distress; cooperative with exam. PSYCH:  alert and oriented x4; does not appear anxious or depressed; affect is appropriate. HEENT: Mucous membranes pink and anicteric; PERRLA; EOM intact; no cervical lymphadenopathy nor thyromegaly or carotid bruit; no JVD; There were no stridor. Neck is very supple. Breasts:: Not examined CHEST WALL: No tenderness CHEST: Normal respiration, clear to auscultation bilaterally.  HEART: Regular rate and rhythm.  There are no murmur, rub, or gallops.   BACK: No kyphosis or scoliosis; no CVA tenderness ABDOMEN: soft and non-tender; no masses, no organomegaly, normal abdominal bowel sounds; no pannus; no intertriginous candida. There is no rebound and no distention. Rectal Exam: Not done EXTREMITIES: No bone or joint deformity; age-appropriate arthropathy of the hands and knees; no edema; no ulcerations.  There is no calf tenderness. Genitalia: not examined PULSES: 2+ and symmetric SKIN: Normal hydration no rash or ulceration CNS: Cranial nerves 2-12 grossly intact no focal lateralizing neurologic deficit.  Speech is fluent; uvula elevated with phonation, facial symmetry and tongue midline. DTR are normal bilaterally, cerebella exam is intact, barbinski is negative and strengths are equaled bilaterally.  No sensory loss.   Labs on Admission:  Basic Metabolic Panel:  Recent Labs Lab 03/12/13 0240 03/18/13 1930  NA 134* 142  K 4.0 4.4  CL 101 109  CO2 21 19  GLUCOSE 99 84  BUN 17 22  CREATININE 1.28 1.12  CALCIUM 8.5 7.9*   Liver Function Tests: No results found for this basename: AST, ALT, ALKPHOS, BILITOT, PROT, ALBUMIN,  in the last 168 hours No results found for this basename: LIPASE, AMYLASE,  in the last 168 hours No results found for this basename: AMMONIA,  in the last 168 hours CBC:  Recent Labs Lab 03/12/13 0240 03/12/13 0856  03/18/13 1930  WBC 4.7 5.6 4.0  HGB 8.5* 9.0* 7.2*  HCT 26.0* 26.4* 22.2*  MCV 93.5 92.3 94.1  PLT 240 263 497*   Cardiac Enzymes:  Recent Labs Lab 03/12/13 0240  TROPONINI <0.30    CBG: No results found for this basename: GLUCAP,  in the last 168 hours   Radiological Exams on Admission: Dg Chest 2 View  03/18/2013   CLINICAL DATA:  Left sided chest pain.  EXAM: CHEST  2 VIEW  COMPARISON:  03/08/2013  FINDINGS: Prior CABG. Mild cardiomegaly. No confluent opacities or edema. No effusions or acute bony abnormality. Mild vascular congestion.  IMPRESSION: Cardiomegaly with vascular congestion.  No overt edema.   Electronically Signed   By: Charlett Nose M.D.   On: 03/18/2013 20:10    EKG: Independently reviewed. NSR with no acute ST-T changes.   Assessment/Plan Present on Admission:  . Chest pain . NSTEMI (non-ST elevated myocardial infarction) . Polysubstance abuse . Diabetes . Chronic kidney disease (CKD), stage III (moderate) . Seizure disorder Anemia, chronic.  PLAN:  Will admit him for r/out.  He will be given ASA, NTP, and continue with his home meds.  In light of recent MI, and him having chest pain, with Hb 7.2 grams per dL, will transfuse him one unit of PRBC.  He has hx of chronic systolic CHF, so we will be careful not to put him in CHF.  His anemia was felt to be iron deficiency with hx of GI bleed.  For his DM, will use SSI and place him on carb mod diet.  For his Sz, will continue Dilantin and Depakote, and will check levels for both.  He is stable, full code, and will be admitted to [facility] service.  Thank you for asking me to participate in his care.  He was advised strongly to quit cocaine and tobacco use for obvious reasons.    Other plans as per orders.  Code Status: FULL Unk Lightning, MD. [facility] Pager [phone] 7pm to 7am.  03/19/2013, 12:12 AM

## 2013-03-19 NOTE — Discharge Summary (Signed)
Physician Discharge Summary  Charles [name] WUJ:[id number] DOB: [birth year] DOA: 03/18/2013  PCP: Bernerd Limbo  Admit date: 03/18/2013 Discharge date: 03/19/2013  Time spent: 40 minutes  Recommendations for Outpatient Follow-up:  1. Followup with primary care physician within one week.  Discharge Diagnoses:  Active Problems:   Chronic kidney disease (CKD), stage III (moderate)   Polysubstance abuse   Seizure disorder   Diabetes   Chest pain   NSTEMI (non-ST elevated myocardial infarction)   Discharge Condition: Stable  Diet recommendation: Heart healthy  Filed Weights   03/19/13 0120  Weight: 80.105 kg (176 lb 9.6 oz)    History of present illness:  Charles [name] is an 67 y.o. male with Multiple Medical problems including CAD S/P CABG in 2009 (s/p 3-vessel CABG (12/2007) // 100% RCA stenosis with collaterals from left system. Severe bifurcation lesions of proxima CXA and OM. Moderate LAD diseaseFollowed by Dr. Rhona Leavens in [facility]), CHF, LVEF 35-40%,who presents to the ED with complaints of intermittent chest pain again. He has substernal CP, with no shortness of breath, nausea or vomiting. He denied black or bloody stool. He was admitted last week for [name] NSTEMI, and was doing well until the chest pain started again this am. He has hx of GI bleed, and has chronic iron deficiency anemia. He was using cocaine and because of it, betablocker was not given. Evaluation tonight with EKG showing NSR no acute ST-T changes. He has [name] Hb of 7.2 grams per dL, negative troponin. His Cr was 1.2 and electrolytes were normal. He has hx of seizure and had [name] seizure during his last hospitalization. He did have [name] stress test after his NSTEMI, which showed no reversible ischemia. Hospitalist was asked to admit him for r/out again.  Hospital Course:   Patient came in to the hospital with left-sided chest pain, this is atypical chest pain, patient pain is intermittent, is not associated with  exertion or relieved by rest, states not dull or heavy chest pain. Patient was recently in the hospital for chest pain, that was attributed secondary to cocaine abuse. Patient had [name] non-STEMI and had [name] stress test afterwards and showed no evidence of reversible ischemia. Patient also has anemia with hemoglobin 7.2, status post transfusion of one unit of packed RBCs. Patient had EGD about 5 months ago and showed blood oozing and at that time gastroenterology recommended to stop all of the platelets. When patient came in recently with [name] non-STEMI he was restarted on aspirin. I will stop aspirin and is non-STEMI was secondary to cocaine abuse. Patient is supposed to be on Protonix 40 mg twice [name] day that was prescribed. Patient does not want to stay, per RN he is homeless, he told me he stays in apparent distress.  Procedures:  Transfusion of one unit of packed RBC  Consultations:  None  Discharge Exam: Filed Vitals:   03/19/13 0603  BP: 154/82  Pulse:   Temp:   Resp:    General: Alert and awake, oriented x3, not in any acute distress. HEENT: anicteric sclera, pupils reactive to light and accommodation, EOMI CVS: S1-S2 clear, no murmur rubs or gallops Chest: clear to auscultation bilaterally, no wheezing, rales or rhonchi Abdomen: soft nontender, nondistended, normal bowel sounds, no organomegaly Extremities: no cyanosis, clubbing or edema noted bilaterally Neuro: Cranial nerves II-XII intact, no focal neurological deficits  Discharge Instructions  Discharge Orders   Future Orders Complete By Expires   Diet - low sodium heart healthy  As directed  Increase activity slowly  As directed        Medication List    STOP taking these medications       aspirin 81 MG tablet      TAKE these medications       amLODipine 5 MG tablet  Commonly known as:  NORVASC  Take 1 tablet (5 mg total) by mouth daily.     cloNIDine 0.2 MG tablet  Commonly known as:  CATAPRES  Take 0.2 mg by  mouth 2 (two) times daily.     diazepam 5 MG tablet  Commonly known as:  VALIUM  Take 5 mg by mouth every 6 (six) hours as needed for anxiety.     diltiazem 120 MG 24 hr capsule  Commonly known as:  CARDIZEM CD  Take 1 capsule (120 mg total) by mouth daily.     divalproex 500 MG DR tablet  Commonly known as:  DEPAKOTE  Take 1 tablet (500 mg total) by mouth 2 (two) times daily.     ferrous sulfate 325 (65 FE) MG tablet  Take 1 tablet (325 mg total) by mouth 2 (two) times daily with [name] meal.     furosemide 40 MG tablet  Commonly known as:  LASIX  Take 1 tablet (40 mg total) by mouth daily.     hydrALAZINE 100 MG tablet  Commonly known as:  APRESOLINE  Take 1 tablet (100 mg total) by mouth every 8 (eight) hours.     isosorbide mononitrate 30 MG 24 hr tablet  Commonly known as:  IMDUR  Take 1 tablet (30 mg total) by mouth daily.     lisinopril 20 MG tablet  Commonly known as:  PRINIVIL,ZESTRIL  Take 1 tablet (20 mg total) by mouth 2 (two) times daily.     mupirocin ointment 2 %  Commonly known as:  BACTROBAN  Place 1 application into the nose 2 (two) times daily.     oxyCODONE 5 MG immediate release tablet  Commonly known as:  Oxy IR/ROXICODONE  Take 1 tablet (5 mg total) by mouth every 4 (four) hours as needed for moderate pain.     pantoprazole 40 MG tablet  Commonly known as:  PROTONIX  Take 1 tablet (40 mg total) by mouth 2 (two) times daily.     phenytoin 100 MG ER capsule  Commonly known as:  DILANTIN  Take 2-3 capsules (200-300 mg total) by mouth 2 (two) times daily. Take 2 capsules by mouth in the morning and 3 capsules by mouth at bedtime.     potassium chloride 10 MEQ tablet  Commonly known as:  K-DUR  Take 1 tablet (10 mEq total) by mouth daily.     simvastatin 40 MG tablet  Commonly known as:  ZOCOR  Take 1 tablet (40 mg total) by mouth every evening.     spironolactone 50 MG tablet  Commonly known as:  ALDACTONE  Take 1 tablet (50 mg total) by mouth  daily.       Allergies  Allergen Reactions  . Motrin [Ibuprofen] Other (See Comments)    Affects kidneys  . Tylenol [Acetaminophen] Other (See Comments)    Affects kidneys      The results of significant diagnostics from this hospitalization (including imaging, microbiology, ancillary and laboratory) are listed below for reference.    Significant Diagnostic Studies: Dg Chest 2 View  03/18/2013   CLINICAL DATA:  Left sided chest pain.  EXAM: CHEST  2 VIEW  COMPARISON:  03/08/2013  FINDINGS: Prior CABG. Mild cardiomegaly. No confluent opacities or edema. No effusions or acute bony abnormality. Mild vascular congestion.  IMPRESSION: Cardiomegaly with vascular congestion.  No overt edema.   Electronically Signed   By: Charlett Nose M.D.   On: 03/18/2013 20:10   Dg Chest 2 View (if Patient Has Fever And/or Copd)  03/08/2013   CLINICAL DATA:  Chest pain, shortness of Breath  EXAM: CHEST - 2 VIEW  COMPARISON:  10/11/2012  FINDINGS: Previous CABG. Stable mild cardiomegaly. Patient is rotated towards the left. There is [name] mild dextroscoliosis of the thoracic spine as before. Central pulmonary vascular congestion. There is some patchy subsegmental atelectasis versus early infiltrate at the left lung base. No definite effusion.  IMPRESSION: 1. New left lower lobe subsegmental atelectasis versus early infiltrate. 2. Stable mild cardiomegaly.   Electronically Signed   By: Oley Balm M.D.   On: 03/08/2013 20:46   Ct Head Wo Contrast  03/08/2013   CLINICAL DATA:  67 year old male with seizure.  EXAM: CT HEAD WITHOUT CONTRAST  TECHNIQUE: Contiguous axial images were obtained from the base of the skull through the vertex without intravenous contrast.  COMPARISON:  03/27/2010 CT  FINDINGS: Atrophy, mild chronic small-vessel white matter ischemic changes left frontal encephalomalacia again noted.  No acute intracranial abnormalities are identified, including mass lesion or mass effect, hydrocephalus,  extra-axial fluid collection, midline shift, hemorrhage, or acute infarction. The visualized bony calvarium is unremarkable.  IMPRESSION: No evidence of acute intracranial abnormality.  Small atrophy and chronic small-vessel white matter ischemic changes.   Electronically Signed   By: Laveda Abbe M.D.   On: 03/08/2013 20:40   Ct Angio Chest Pe W/cm &/or Wo Cm  03/08/2013   CLINICAL DATA:  67 year old male with chest pain and shortness of breath.  EXAM: CT ANGIOGRAPHY CHEST WITH CONTRAST  TECHNIQUE: Multidetector CT imaging of the chest was performed using the standard protocol during bolus administration of intravenous contrast. Multiplanar CT image reconstructions including MIPs were obtained to evaluate the vascular anatomy.  CONTRAST:  OMNIPAQUE IOHEXOL 350 MG/ML SOLN  COMPARISON:  02/2019 and prior chest radiographs  FINDINGS: This is [name] technically satisfactory study.  No pulmonary emboli are identified.  Ectasia of the thoracic aorta is noted without aneurysm.  Cardiomegaly and cardiac surgical changes are identified.  Trace pleural effusions are present.  No enlarged lymph nodes are identified.  Mild bibasilar atelectasis/scarring noted.  Mild interlobular septal thickening bilaterally is present.  There is no evidence of consolidation, airspace disease or pulmonary nodule/mass.  No acute upper abdominal abnormalities are noted.  [name] small hiatal hernia is present.  No acute or suspicious bony abnormalities are present.  Review of the MIP images confirms the above findings.  IMPRESSION: No evidence of pulmonary emboli.  Trace pleural effusions with mild bibasilar atelectasis/ scarring.  Mild interlobular septal thickening bilaterally of uncertain chronicity. Mild interstitial edema is not excluded.  Cardiomegaly and ectatic thoracic aorta.  Small hiatal hernia.   Electronically Signed   By: Laveda Abbe M.D.   On: 03/08/2013 23:15   Nm Myocar Multi W/spect W/wall Motion / Ef  03/12/2013   CLINICAL DATA:   Chest pain and history of coronary artery disease, prior myocardial infarction, prior CABG in 2009 and CHF.  EXAM: MYOCARDIAL IMAGING WITH SPECT (REST AND PHARMACOLOGIC-STRESS)  GATED LEFT VENTRICULAR WALL MOTION STUDY  LEFT VENTRICULAR EJECTION FRACTION  TECHNIQUE: Standard myocardial SPECT imaging was performed after resting intravenous injection of 10 mCi Tc-41m sestamibi. Subsequently,  intravenous infusion of Lexiscan was performed under the supervision of the Cardiology staff. At peak effect of the drug, 30 mCi Tc-47m sestamibi was injected intravenously and standard myocardial SPECT imaging was performed. Quantitative gated imaging was also performed to evaluate left ventricular wall motion, and estimate left ventricular ejection fraction.  COMPARISON:  None.  FINDINGS: Utilizing gated data, the end-diastolic volume is estimated to be 179 mL and the end systolic volume 105 mL. Calculated ejection fraction is 41%.  Gated wall motion analysis shows mild hypokinesis of the inferior wall and septum as well as [name] component of mild global hypokinesis. No akinetic or dyskinetic segments are identified.  SPECT imaging demonstrates inferior wall scar as well as probable scar at the level of the mid anterior wall. There is no evidence of inducible myocardial ischemia.  IMPRESSION: Inferior and anterior wall scar. No evidence of inducible myocardial ischemia. Quantitative ejection fraction of 41% with septal and inferior hypokinesis present.   Electronically Signed   By: Irish Lack M.D.   On: 03/12/2013 12:49    Microbiology: Recent Results (from the past 240 hour(s))  CULTURE, BLOOD (ROUTINE X 2)     Status: None   Collection Time    03/09/13  1:20 PM      Result Value Range Status   Specimen Description BLOOD LEFT ARM   Final   Special Requests BOTTLES DRAWN AEROBIC AND ANAEROBIC 10CC   Final   Culture  Setup Time     Final   Value: 03/09/2013 17:55     Performed at [facility]   Culture      Final   Value: NO GROWTH 5 DAYS     Performed at [facility]   Report Status 03/15/2013 FINAL   Final  CULTURE, BLOOD (ROUTINE X 2)     Status: None   Collection Time    03/09/13  1:30 PM      Result Value Range Status   Specimen Description BLOOD LEFT HAND   Final   Special Requests BOTTLES DRAWN AEROBIC AND ANAEROBIC 10CC   Final   Culture  Setup Time     Final   Value: 03/09/2013 17:55     Performed at [facility]   Culture     Final   Value: NO GROWTH 5 DAYS     Performed at [facility]   Report Status 03/15/2013 FINAL   Final     Labs: Basic Metabolic Panel:  Recent Labs Lab 03/18/13 1930 03/19/13 0114  NA 142  --   K 4.4  --   CL 109  --   CO2 19  --   GLUCOSE 84  --   BUN 22  --   CREATININE 1.12 1.13  CALCIUM 7.9*  --    Liver Function Tests: No results found for this basename: AST, ALT, ALKPHOS, BILITOT, PROT, ALBUMIN,  in the last 168 hours No results found for this basename: LIPASE, AMYLASE,  in the last 168 hours No results found for this basename: AMMONIA,  in the last 168 hours CBC:  Recent Labs Lab 03/18/13 1930 03/19/13 0640  WBC 4.0 4.9  HGB 7.2* 8.3*  HCT 22.2* 26.6*  MCV 94.1 93.3  PLT 497* 510*   Cardiac Enzymes:  Recent Labs Lab 03/19/13 0040 03/19/13 0640  TROPONINI <0.30 <0.30   BNP: BNP (last 3 results)  Recent Labs  08/13/12 0720 03/08/13 1747 03/18/13 1930  PROBNP 862.5* 9784.0* 2997.0*   CBG:  Recent Labs Lab 03/19/13 0743 03/19/13 1148  GLUCAP 83 128*       Signed:  Lener Ventresca [name]  [facility] 03/19/2013, 1:19 PM

## 2013-03-19 NOTE — Progress Notes (Signed)
CSW provided bus pass for pt to get to [employer]. CSW signing off.   Maryclare Labrador, MSW, [facility] Clinical Social Worker [phone]

## 2013-03-20 LAB — TYPE AND SCREEN
ABO/RH(D): O POS
Antibody Screen: NEGATIVE
Unit division: 0

## 2013-03-21 ENCOUNTER — Emergency Department (HOSPITAL_COMMUNITY)
Admission: EM | Admit: 2013-03-21 | Discharge: 2013-03-21 | Disposition: A | Discharge status: Home / Self Care | Payer: Medicare Other | Attending: Emergency Medicine | Admitting: Emergency Medicine

## 2013-03-21 ENCOUNTER — Encounter (HOSPITAL_COMMUNITY): Payer: Self-pay | Admitting: Emergency Medicine

## 2013-03-21 ENCOUNTER — Emergency Department (HOSPITAL_COMMUNITY): Payer: Medicare Other

## 2013-03-21 DIAGNOSIS — Z5900 Homelessness unspecified: Secondary | ICD-10-CM | POA: Insufficient documentation

## 2013-03-21 DIAGNOSIS — Z951 Presence of aortocoronary bypass graft: Secondary | ICD-10-CM | POA: Insufficient documentation

## 2013-03-21 DIAGNOSIS — I129 Hypertensive chronic kidney disease with stage 1 through stage 4 chronic kidney disease, or unspecified chronic kidney disease: Secondary | ICD-10-CM | POA: Insufficient documentation

## 2013-03-21 DIAGNOSIS — E119 Type 2 diabetes mellitus without complications: Secondary | ICD-10-CM | POA: Insufficient documentation

## 2013-03-21 DIAGNOSIS — I5023 Acute on chronic systolic (congestive) heart failure: Secondary | ICD-10-CM | POA: Insufficient documentation

## 2013-03-21 DIAGNOSIS — D509 Iron deficiency anemia, unspecified: Secondary | ICD-10-CM | POA: Insufficient documentation

## 2013-03-21 DIAGNOSIS — G40909 Epilepsy, unspecified, not intractable, without status epilepticus: Secondary | ICD-10-CM | POA: Insufficient documentation

## 2013-03-21 DIAGNOSIS — F17200 Nicotine dependence, unspecified, uncomplicated: Secondary | ICD-10-CM | POA: Insufficient documentation

## 2013-03-21 DIAGNOSIS — I252 Old myocardial infarction: Secondary | ICD-10-CM | POA: Insufficient documentation

## 2013-03-21 DIAGNOSIS — R05 Cough: Secondary | ICD-10-CM | POA: Insufficient documentation

## 2013-03-21 DIAGNOSIS — R059 Cough, unspecified: Secondary | ICD-10-CM

## 2013-03-21 DIAGNOSIS — Z79899 Other long term (current) drug therapy: Secondary | ICD-10-CM | POA: Insufficient documentation

## 2013-03-21 DIAGNOSIS — Z8739 Personal history of other diseases of the musculoskeletal system and connective tissue: Secondary | ICD-10-CM | POA: Insufficient documentation

## 2013-03-21 DIAGNOSIS — R079 Chest pain, unspecified: Secondary | ICD-10-CM

## 2013-03-21 DIAGNOSIS — Z8719 Personal history of other diseases of the digestive system: Secondary | ICD-10-CM | POA: Insufficient documentation

## 2013-03-21 DIAGNOSIS — I2510 Atherosclerotic heart disease of native coronary artery without angina pectoris: Secondary | ICD-10-CM | POA: Insufficient documentation

## 2013-03-21 DIAGNOSIS — N183 Chronic kidney disease, stage 3 unspecified: Secondary | ICD-10-CM | POA: Insufficient documentation

## 2013-03-21 DIAGNOSIS — R0602 Shortness of breath: Secondary | ICD-10-CM | POA: Insufficient documentation

## 2013-03-21 DIAGNOSIS — E785 Hyperlipidemia, unspecified: Secondary | ICD-10-CM | POA: Insufficient documentation

## 2013-03-21 LAB — POCT I-STAT TROPONIN I: Troponin i, poc: 0.05 ng/mL (ref 0.00–0.08)

## 2013-03-21 LAB — BASIC METABOLIC PANEL
BUN: 14 mg/dL (ref 6–23)
CHLORIDE: 98 meq/L (ref 96–112)
CO2: 19 mEq/L (ref 19–32)
CREATININE: 1.02 mg/dL (ref 0.50–1.35)
Calcium: 9.4 mg/dL (ref 8.4–10.5)
GFR, EST AFRICAN AMERICAN: 85 mL/min — AB (ref >90)
GFR, EST NON AFRICAN AMERICAN: 73 mL/min — AB (ref >90)
Glucose, Bld: 78 mg/dL (ref 70–99)
Potassium: 4.4 mEq/L (ref 3.7–5.3)
Sodium: 136 mEq/L — ABNORMAL LOW (ref 137–147)

## 2013-03-21 LAB — PROTIME-INR
INR: 0.93 (ref 0.00–1.49)
PROTHROMBIN TIME: 12.3 s (ref 11.6–15.2)

## 2013-03-21 LAB — CBC
HCT: 28.2 % — ABNORMAL LOW (ref 39.0–52.0)
Hemoglobin: 9.3 g/dL — ABNORMAL LOW (ref 13.0–17.0)
MCH: 30 pg (ref 26.0–34.0)
MCHC: 33 g/dL (ref 30.0–36.0)
MCV: 91 fL (ref 78.0–100.0)
PLATELETS: 659 10*3/uL — AB (ref 150–400)
RBC: 3.1 MIL/uL — ABNORMAL LOW (ref 4.22–5.81)
RDW: 15.5 % (ref 11.5–15.5)
WBC: 6.9 10*3/uL (ref 4.0–10.5)

## 2013-03-21 LAB — TROPONIN I: Troponin I: <0.3 ng/mL (ref <0.30)

## 2013-03-21 MED ORDER — ISOSORBIDE MONONITRATE ER 30 MG PO TB24
30.0000 mg | ORAL_TABLET | Freq: Every day | ORAL | Status: DC
Start: 2013-03-21 — End: 2013-03-21
  Filled 2013-03-21: qty 1

## 2013-03-21 MED ORDER — PHENYTOIN SODIUM EXTENDED 100 MG PO CAPS
300.0000 mg | ORAL_CAPSULE | Freq: Once | ORAL | Status: AC
  Administered 2013-03-21: 300 mg via ORAL
  Filled 2013-03-21: qty 3

## 2013-03-21 MED ORDER — NITROGLYCERIN 0.4 MG SL SUBL
0.4000 mg | SUBLINGUAL_TABLET | SUBLINGUAL | Status: DC | PRN
  Administered 2013-03-21: 0.4 mg via SUBLINGUAL
  Filled 2013-03-21: qty 25

## 2013-03-21 MED ORDER — FUROSEMIDE 40 MG PO TABS
40.0000 mg | ORAL_TABLET | Freq: Once | ORAL | Status: AC
  Administered 2013-03-21: 40 mg via ORAL
  Filled 2013-03-21: qty 1

## 2013-03-21 MED ORDER — HYDROCOD POLST-CHLORPHEN POLST 10-8 MG/5ML PO LQCR
5.0000 mL | Freq: Once | ORAL | Status: AC
  Administered 2013-03-21: 5 mL via ORAL
  Filled 2013-03-21: qty 5

## 2013-03-21 NOTE — ED Notes (Signed)
Bed: WA06 Expected date: 03/21/13 Expected time: 1:46 AM Means of arrival: Ambulance Comments: pneumonia

## 2013-03-21 NOTE — Discharge Instructions (Signed)
Chest Pain Observation It is often hard to give a specific diagnosis for the cause of chest pain. Among other possibilities your symptoms might be caused by inadequate oxygen delivery to your heart (angina). Angina that is not treated or evaluated can lead to a heart attack (myocardial infarction) or death. Blood tests, electrocardiograms, and X-rays may have been done to help determine a possible cause of your chest pain. After evaluation and observation, your health care provider has determined that it is unlikely your pain was caused by an unstable condition that requires hospitalization. However, a full evaluation of your pain may need to be completed, with additional diagnostic testing as directed. It is very important to keep your follow-up appointments. Not keeping your follow-up appointments could result in permanent heart damage, disability, or death. If there is any problem keeping your follow-up appointments, you must call your health care provider. HOME CARE INSTRUCTIONS  Due to the slight chance that your pain could be angina, it is important to follow your health care provider's treatment plan and also maintain a healthy lifestyle:  Maintain or work toward achieving a healthy weight.  Stay physically active and exercise regularly.  Decrease your salt intake.  Eat a balanced, healthy diet. Talk to a dietician to learn about heart healthy foods.  Increase your fiber intake by including whole grains, vegetables, fruits, and nuts in your diet.  Avoid situations that cause stress, anger, or depression.  Take medicines as advised by your health care provider. Report any side effects to your health care provider. Do not stop medicines or adjust the dosages on your own.  Quit smoking. Do not use nicotine patches or gum until you check with your health care provider.  Keep your blood pressure, blood sugar, and cholesterol levels within normal limits.  Limit alcohol intake to no more than  1 drink per day for women that are not pregnant and 2 drinks per day for men.  Do not abuse drugs. SEEK IMMEDIATE MEDICAL CARE IF: You have severe chest pain or pressure which may include symptoms such as:  You feel pain or pressure in you arms, neck, jaw, or back.  You have severe back or abdominal pain, feel sick to your stomach (nauseous), or throw up (vomit).  You are sweating profusely.  You are having a fast or irregular heartbeat.  You feel short of breath while at rest.  You notice increasing shortness of breath during rest, sleep, or with activity.  You have chest pain that does not get better after rest or after taking your usual medicine.  You wake from sleep with chest pain.  You are unable to sleep because you cannot breathe.  You develop a frequent cough or you are coughing up blood.  You feel dizzy, faint, or experience extreme fatigue.  You develop severe weakness, dizziness, fainting, or chills. Any of these symptoms may represent a serious problem that is an emergency. Do not wait to see if the symptoms will go away. Call your local emergency services (911 in the [place] Do not drive yourself to the hospital. MAKE SURE YOU:  Understand these instructions.  Will watch your condition.  Will get help right away if you are not doing well or get worse. Document Released: 04/08/2010 Document Revised: 11/06/2012 Document Reviewed: 09/05/2012 [facility] Patient Information 2014 [place], [place].  Cough, Adult  A cough is a reflex. It helps you clear your throat and airways. A cough can help heal your body. A cough can last 2  or 3 weeks (acute) or may last more than 8 weeks (chronic). Some common causes of a cough can include an infection, allergy, or a cold. HOME CARE  Only take medicine as told by your doctor.  If given, take your medicines (antibiotics) as told. Finish them even if you start to feel better.  Use a cold steam vaporizer or humidier in your home.  This can help loosen thick spit (secretions).  Sleep so you are almost sitting up (semi-upright). Use pillows to do this. This helps reduce coughing.  Rest as needed.  Stop smoking if you smoke. GET HELP RIGHT AWAY IF:  You have yellowish-white fluid (pus) in your thick spit.  Your cough gets worse.  Your medicine does not reduce coughing, and you are losing sleep.  You cough up blood.  You have trouble breathing.  Your pain gets worse and medicine does not help.  You have a fever. MAKE SURE YOU:   Understand these instructions.  Will watch your condition.  Will get help right away if you are not doing well or get worse. Document Released: 11/17/2010 Document Revised: 05/29/2011 Document Reviewed: 11/17/2010 [facility] Patient Information 2014 [place].

## 2013-03-21 NOTE — ED Notes (Signed)
Per EMS, pt. From Shelter with complaint of chest congestion due  pneumonia , pt. Was seen at [facility] the other day for the same  Complain. Per EMS pt. metioned of claiming syncopal episodes since yesterday but still uncertain of how long. Claimed of SOB, O2 sat @99  on Room air .

## 2013-03-21 NOTE — ED Provider Notes (Signed)
CSN: [id number]     Arrival date & time 03/21/13  0211 History   First MD Initiated Contact with Patient 03/21/13 0244     Chief Complaint  Patient presents with  . Pneumonia  . Chest Pain   (Consider location/radiation/quality/duration/timing/severity/associated sxs/prior Treatment) HPI 68 year old male presents to emergency department via EMS from homeless shelter with complaint of chest pain.  Patient reports pain started afternoon around 4 PM.  He reports the pain was a squeezing sensation in his chest.  He felt short of breath.  He had to sit down.  Patient with recent admission for an NSTEMI, thought to be secondary to cocaine abuse.  Patient also admitted.  This past week for chest pain, rule out, negative enzymes.  He has had stress test that showed no myocardial dysfunction.  He reports he has not yet filled the prescriptions written for him.  At discharge as pharmacy has not been open.  Patient reports chest pain has been constant since 4 PM.  He has had cough.  No fevers no chills.  Patient had blood transfusion during last admission, has chronic anemia, thought to be due to chronic GI bleeding.  He denies any dizziness, weakness, or lightheadedness.  He reports, that he's been eating well for the last few days.  He denies any alcohol or drug use. Past Medical History  Diagnosis Date  . Iron deficiency anemia   . Hypertension   . Chronic systolic heart failure     2D Echo (03/2010) - EF 35-40% with akinesis of inferoposterior myocardium  . CAD (coronary artery disease)     s/p 3-vessel CABG (12/2007) // 100% RCA stenosis with collaterals from left system. Severe bifurcation lesions of proxima CXA and OM. Moderate LAD diseaseFollowed by Dr. W[facility]  . Hyperlipidemia LDL goal <70   . Obstructive sleep apnea     not on home CPAP  . Ischemic cardiomyopathy   . Chronic kidney disease (CKD), stage III (moderate)     BL SCr 1.5-1.6  . Homelessness   . 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 // Colonoscopy and EGD in 03/2010 negative per report (record not found)  . 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 Polysubstance abuse     cocaine, THC  . AV malformation of gastrointestinal tract   . Family history of early CAD   . DDD (degenerative disc disease), lumbar   . Renal artery stenosis   . Peripheral vascular disease   . Seizure disorder   . Diabetes   . Acute on chronic systolic CHF (congestive heart failure) 08/13/2012  . Myocardial infarction   . Seizures    Past Surgical History  Procedure Laterality Date  . Coronary artery bypass graft  12/2007  . Apc  03/2010    To treat small bowel AVMs  . Esophagogastroduodenoscopy N/A 08/14/2012    Procedure: ESOPHAGOGASTRODUODENOSCOPY (EGD);  Surgeon: [name], MD;  Location: [facility] ENDOSCOPY;  Service: Endoscopy;  Laterality: N/A;  . Hot hemostasis N/A 08/14/2012    Procedure: HOT HEMOSTASIS (ARGON PLASMA COAGULATION/BICAP);  Surgeon: [name], MD;  Location: [facility] ENDOSCOPY;  Service: Endoscopy;  Laterality: N/A;   Family History  Problem Relation Age of Onset  . Heart disease Mother     unknown type  . Heart disease Father 10  died of MI at 39yo  . Heart disease Paternal Grandfather 17    died of MI  . Heart disease    . Heart disease Brother 30   History  Substance Use Topics  . Smoking status: Current Every Day Smoker -- 0.20 packs/day for 13 years    Types: Cigarettes    Last Attempt to Quit: 03/20/1997  . Smokeless tobacco: Never Used  . Alcohol Use: Yes     Comment: occasionally drinks, a few times a month    Review of Systems  See History of Present Illness; otherwise all other systems are reviewed and negative Allergies  Motrin and Tylenol  Home Medications   Current Outpatient Rx  Name  Route  Sig  Dispense  Refill  .  amLODipine (NORVASC) 5 MG tablet   Oral   Take 1 tablet (5 mg total) by mouth daily.   30 tablet   2   . cloNIDine (CATAPRES) 0.2 MG tablet   Oral   Take 0.2 mg by mouth 2 (two) times daily.         . diazepam (VALIUM) 5 MG tablet   Oral   Take 5 mg by mouth every 6 (six) hours as needed for anxiety.         [name] diltiazem (CARDIZEM CD) 120 MG 24 hr capsule   Oral   Take 1 capsule (120 mg total) by mouth daily.   60 capsule   1   . divalproex (DEPAKOTE) 500 MG DR tablet   Oral   Take 1 tablet (500 mg total) by mouth 2 (two) times daily.   60 tablet   1   . ferrous sulfate 325 (65 FE) MG tablet   Oral   Take 1 tablet (325 mg total) by mouth 2 (two) times daily with a meal.   30 tablet   3   . furosemide (LASIX) 40 MG tablet   Oral   Take 1 tablet (40 mg total) by mouth daily.   30 tablet   3   . hydrALAZINE (APRESOLINE) 100 MG tablet   Oral   Take 1 tablet (100 mg total) by mouth every 8 (eight) hours.   90 tablet   2   . isosorbide mononitrate (IMDUR) 30 MG 24 hr tablet   Oral   Take 1 tablet (30 mg total) by mouth daily.   30 tablet   1   . lisinopril (PRINIVIL,ZESTRIL) 20 MG tablet   Oral   Take 1 tablet (20 mg total) by mouth 2 (two) times daily.   30 tablet   1   . pantoprazole (PROTONIX) 40 MG tablet   Oral   Take 1 tablet (40 mg total) by mouth 2 (two) times daily.   60 tablet   0   . phenytoin (DILANTIN) 100 MG ER capsule   Oral   Take 2-3 capsules (200-300 mg total) by mouth 2 (two) times daily. Take 2 capsules by mouth in the morning and 3 capsules by mouth at bedtime.   90 capsule   2   . potassium chloride (K-DUR) 10 MEQ tablet   Oral   Take 1 tablet (10 mEq total) by mouth daily.   30 tablet   1   . simvastatin (ZOCOR) 40 MG tablet   Oral   Take 1 tablet (40 mg total) by mouth every evening.   30 tablet   1   . spironolactone (ALDACTONE) 50 MG tablet   Oral   Take  1 tablet (50 mg total) by mouth daily.   30 tablet   2    . mupirocin ointment (BACTROBAN) 2 %   Nasal   Place 1 application into the nose 2 (two) times daily.   22 g   0   . oxyCODONE (OXY IR/ROXICODONE) 5 MG immediate release tablet   Oral   Take 1 tablet (5 mg total) by mouth every 4 (four) hours as needed for moderate pain.   30 tablet   0    BP 149/90  Pulse 92  Temp(Src) 98.4 F (36.9 C) (Oral)  Resp 20  SpO2 96% Physical Exam  Nursing note and vitals reviewed. Constitutional: He is oriented to person, place, and time. He appears well-developed and well-nourished. No distress.  HENT:  Head: Normocephalic and atraumatic.  Nose: Nose normal.  Mouth/Throat: Oropharynx is clear and moist.  Eyes: Conjunctivae and EOM are normal. Pupils are equal, round, and reactive to light.  Neck: Normal range of motion. Neck supple. No JVD present. No tracheal deviation present. No thyromegaly present.  Cardiovascular: Normal rate, regular rhythm, normal heart sounds and intact distal pulses.  Exam reveals no gallop and no friction rub.   No murmur heard. Pulmonary/Chest: Effort normal and breath sounds normal. No stridor. No respiratory distress. He has no wheezes. He has no rales. Tenderness: tenderness to palpation throughout anterior chest wall.  Palpation of the chest wall reproduces pain.  Cough noted  Abdominal: Soft. Bowel sounds are normal. He exhibits no distension and no mass. There is no tenderness. There is no rebound and no guarding.  Musculoskeletal: Normal range of motion. He exhibits no edema and no tenderness.  Lymphadenopathy:    He has no cervical adenopathy.  Neurological: He is alert and oriented to person, place, and time. He exhibits normal muscle tone. Coordination normal.  Skin: Skin is warm and dry. No rash noted. No erythema. No pallor.  Psychiatric: He has a normal mood and affect. His behavior is normal. Judgment and thought content normal.    ED Course  Procedures (including critical care time) Labs  Review Labs Reviewed  BASIC METABOLIC PANEL - Abnormal; Notable for the following:    Sodium 136 (*)    GFR calc non Af Amer 73 (*)    GFR calc Af Amer 85 (*)    All other components within normal limits  CBC - Abnormal; Notable for the following:    RBC 3.10 (*)    Hemoglobin 9.3 (*)    HCT 28.2 (*)    Platelets 659 (*)    All other components within normal limits  PROTIME-INR  TROPONIN I  POCT I-STAT TROPONIN I   Imaging Review Dg Chest Port 1 View  03/21/2013   CLINICAL DATA:  Chest pain  EXAM: PORTABLE CHEST - 1 VIEW  COMPARISON:  03/18/2013  FINDINGS: Stable postoperative changes in the mediastinum. Cardiac enlargement with mild pulmonary vascular congestion. No edema or developing consolidation. Changes are stable since previous study. No pleural effusion. No pneumothorax.  IMPRESSION: Cardiac enlargement with pulmonary vascular congestion. No edema or consolidation appear   Electronically Signed   By: Lucienne Capers M.D.   On: 03/21/2013 03:02    EKG Interpretation    Date/Time:  Friday March 21 2013 02:50:43 EST Ventricular Rate:  83 PR Interval:  184 QRS Duration: 107 QT Interval:  401 QTC Calculation: 471 R Axis:   65 Text Interpretation:  Sinus rhythm RSR' in V1 or V2, probably normal variant Probable  inferior infarct, old Lateral leads are also involved No significant change since last tracing Confirmed by Rosabell Geyer  MD, Myrissa Chipley [phone]) on 03/21/2013 3:07:00 AM            MDM   1. Cough   2. Chest pain    68 year old male with chest pain.  Workup here without signs of ischemia.  Patient was anxious to leave, left prior to official discharge.  Patient reports he will be filling his prescriptions today.  He reports he has followup scheduled    Kalman Drape, MD 03/21/13 [phone]

## 2013-07-15 ENCOUNTER — Encounter (HOSPITAL_COMMUNITY): Payer: Self-pay | Admitting: Emergency Medicine

## 2013-07-15 ENCOUNTER — Inpatient Hospital Stay (HOSPITAL_COMMUNITY)
Admission: EM | Admit: 2013-07-15 | Discharge: 2013-07-21 | DRG: 292 | Disposition: A | Discharge status: Home / Self Care | Payer: Medicare Other | Attending: Internal Medicine | Admitting: Internal Medicine

## 2013-07-15 ENCOUNTER — Emergency Department (HOSPITAL_COMMUNITY): Payer: Medicare Other

## 2013-07-15 DIAGNOSIS — E119 Type 2 diabetes mellitus without complications: Secondary | ICD-10-CM

## 2013-07-15 DIAGNOSIS — I509 Heart failure, unspecified: Secondary | ICD-10-CM | POA: Diagnosis present

## 2013-07-15 DIAGNOSIS — I5043 Acute on chronic combined systolic (congestive) and diastolic (congestive) heart failure: Principal | ICD-10-CM

## 2013-07-15 DIAGNOSIS — M79604 Pain in right leg: Secondary | ICD-10-CM

## 2013-07-15 DIAGNOSIS — I739 Peripheral vascular disease, unspecified: Secondary | ICD-10-CM | POA: Diagnosis present

## 2013-07-15 DIAGNOSIS — I5022 Chronic systolic (congestive) heart failure: Secondary | ICD-10-CM

## 2013-07-15 DIAGNOSIS — I10 Essential (primary) hypertension: Secondary | ICD-10-CM

## 2013-07-15 DIAGNOSIS — E785 Hyperlipidemia, unspecified: Secondary | ICD-10-CM | POA: Diagnosis present

## 2013-07-15 DIAGNOSIS — I200 Unstable angina: Secondary | ICD-10-CM

## 2013-07-15 DIAGNOSIS — R079 Chest pain, unspecified: Secondary | ICD-10-CM

## 2013-07-15 DIAGNOSIS — I5033 Acute on chronic diastolic (congestive) heart failure: Secondary | ICD-10-CM

## 2013-07-15 DIAGNOSIS — Z7251 High risk heterosexual behavior: Secondary | ICD-10-CM

## 2013-07-15 DIAGNOSIS — I129 Hypertensive chronic kidney disease with stage 1 through stage 4 chronic kidney disease, or unspecified chronic kidney disease: Secondary | ICD-10-CM | POA: Diagnosis present

## 2013-07-15 DIAGNOSIS — I252 Old myocardial infarction: Secondary | ICD-10-CM

## 2013-07-15 DIAGNOSIS — F17200 Nicotine dependence, unspecified, uncomplicated: Secondary | ICD-10-CM | POA: Diagnosis present

## 2013-07-15 DIAGNOSIS — I2789 Other specified pulmonary heart diseases: Secondary | ICD-10-CM | POA: Diagnosis present

## 2013-07-15 DIAGNOSIS — Z79899 Other long term (current) drug therapy: Secondary | ICD-10-CM

## 2013-07-15 DIAGNOSIS — N179 Acute kidney failure, unspecified: Secondary | ICD-10-CM | POA: Diagnosis not present

## 2013-07-15 DIAGNOSIS — F1910 Other psychoactive substance abuse, uncomplicated: Secondary | ICD-10-CM

## 2013-07-15 DIAGNOSIS — I255 Ischemic cardiomyopathy: Secondary | ICD-10-CM

## 2013-07-15 DIAGNOSIS — I272 Pulmonary hypertension, unspecified: Secondary | ICD-10-CM

## 2013-07-15 DIAGNOSIS — G40909 Epilepsy, unspecified, not intractable, without status epilepticus: Secondary | ICD-10-CM | POA: Diagnosis present

## 2013-07-15 DIAGNOSIS — Z5900 Homelessness unspecified: Secondary | ICD-10-CM

## 2013-07-15 DIAGNOSIS — G4733 Obstructive sleep apnea (adult) (pediatric): Secondary | ICD-10-CM | POA: Diagnosis present

## 2013-07-15 DIAGNOSIS — M79606 Pain in leg, unspecified: Secondary | ICD-10-CM

## 2013-07-15 DIAGNOSIS — R001 Bradycardia, unspecified: Secondary | ICD-10-CM

## 2013-07-15 DIAGNOSIS — F1410 Cocaine abuse, uncomplicated: Secondary | ICD-10-CM | POA: Diagnosis present

## 2013-07-15 DIAGNOSIS — I214 Non-ST elevation (NSTEMI) myocardial infarction: Secondary | ICD-10-CM

## 2013-07-15 DIAGNOSIS — M79605 Pain in left leg: Secondary | ICD-10-CM

## 2013-07-15 DIAGNOSIS — M5136 Other intervertebral disc degeneration, lumbar region: Secondary | ICD-10-CM

## 2013-07-15 DIAGNOSIS — R0789 Other chest pain: Secondary | ICD-10-CM

## 2013-07-15 DIAGNOSIS — I5023 Acute on chronic systolic (congestive) heart failure: Secondary | ICD-10-CM

## 2013-07-15 DIAGNOSIS — Z951 Presence of aortocoronary bypass graft: Secondary | ICD-10-CM

## 2013-07-15 DIAGNOSIS — K922 Gastrointestinal hemorrhage, unspecified: Secondary | ICD-10-CM

## 2013-07-15 DIAGNOSIS — D509 Iron deficiency anemia, unspecified: Secondary | ICD-10-CM | POA: Diagnosis present

## 2013-07-15 DIAGNOSIS — N183 Chronic kidney disease, stage 3 unspecified: Secondary | ICD-10-CM | POA: Diagnosis present

## 2013-07-15 DIAGNOSIS — R569 Unspecified convulsions: Secondary | ICD-10-CM

## 2013-07-15 DIAGNOSIS — Z8249 Family history of ischemic heart disease and other diseases of the circulatory system: Secondary | ICD-10-CM

## 2013-07-15 DIAGNOSIS — I701 Atherosclerosis of renal artery: Secondary | ICD-10-CM

## 2013-07-15 DIAGNOSIS — I2510 Atherosclerotic heart disease of native coronary artery without angina pectoris: Secondary | ICD-10-CM

## 2013-07-15 DIAGNOSIS — I5021 Acute systolic (congestive) heart failure: Secondary | ICD-10-CM

## 2013-07-15 DIAGNOSIS — I2589 Other forms of chronic ischemic heart disease: Secondary | ICD-10-CM | POA: Diagnosis present

## 2013-07-15 LAB — COMPREHENSIVE METABOLIC PANEL
ALBUMIN: 2.8 g/dL — AB (ref 3.5–5.2)
ALK PHOS: 78 U/L (ref 39–117)
ALT: 20 U/L (ref 0–53)
AST: 43 U/L — ABNORMAL HIGH (ref 0–37)
BUN: 21 mg/dL (ref 6–23)
CO2: 21 mEq/L (ref 19–32)
Calcium: 8.5 mg/dL (ref 8.4–10.5)
Chloride: 105 mEq/L (ref 96–112)
Creatinine, Ser: 1.06 mg/dL (ref 0.50–1.35)
GFR calc non Af Amer: 70 mL/min — ABNORMAL LOW (ref >90)
GFR, EST AFRICAN AMERICAN: 81 mL/min — AB (ref >90)
GLUCOSE: 73 mg/dL (ref 70–99)
POTASSIUM: 4.3 meq/L (ref 3.7–5.3)
Sodium: 141 mEq/L (ref 137–147)
Total Bilirubin: 0.8 mg/dL (ref 0.3–1.2)
Total Protein: 7 g/dL (ref 6.0–8.3)

## 2013-07-15 LAB — PROTIME-INR
INR: 1.24 (ref 0.00–1.49)
Prothrombin Time: 15.3 seconds — ABNORMAL HIGH (ref 11.6–15.2)

## 2013-07-15 LAB — CBC WITH DIFFERENTIAL/PLATELET
Basophils Absolute: 0 10*3/uL (ref 0.0–0.1)
Basophils Relative: 0 % (ref 0–1)
Eosinophils Absolute: 0.1 10*3/uL (ref 0.0–0.7)
Eosinophils Relative: 2 % (ref 0–5)
HEMATOCRIT: 24.3 % — AB (ref 39.0–52.0)
HEMOGLOBIN: 7.4 g/dL — AB (ref 13.0–17.0)
LYMPHS PCT: 15 % (ref 12–46)
Lymphs Abs: 0.8 10*3/uL (ref 0.7–4.0)
MCH: 24.8 pg — ABNORMAL LOW (ref 26.0–34.0)
MCHC: 30.5 g/dL (ref 30.0–36.0)
MCV: 81.5 fL (ref 78.0–100.0)
MONO ABS: 0.6 10*3/uL (ref 0.1–1.0)
MONOS PCT: 12 % (ref 3–12)
Neutro Abs: 3.8 10*3/uL (ref 1.7–7.7)
Neutrophils Relative %: 71 % (ref 43–77)
Platelets: 306 10*3/uL (ref 150–400)
RBC: 2.98 MIL/uL — AB (ref 4.22–5.81)
RDW: 18.5 % — ABNORMAL HIGH (ref 11.5–15.5)
WBC: 5.3 10*3/uL (ref 4.0–10.5)

## 2013-07-15 LAB — PRO B NATRIURETIC PEPTIDE: Pro B Natriuretic peptide (BNP): 4293 pg/mL — ABNORMAL HIGH (ref 0–125)

## 2013-07-15 MED ORDER — FUROSEMIDE 10 MG/ML IJ SOLN
40.0000 mg | Freq: Once | INTRAMUSCULAR | Status: AC
  Administered 2013-07-16: 40 mg via INTRAVENOUS
  Filled 2013-07-15: qty 4

## 2013-07-15 MED ORDER — ONDANSETRON HCL 4 MG/2ML IJ SOLN
4.0000 mg | Freq: Once | INTRAMUSCULAR | Status: AC
  Administered 2013-07-15: 4 mg via INTRAVENOUS
  Filled 2013-07-15: qty 2

## 2013-07-15 MED ORDER — FENTANYL CITRATE 0.05 MG/ML IJ SOLN
50.0000 ug | Freq: Once | INTRAMUSCULAR | Status: AC
  Administered 2013-07-15: 50 ug via INTRAVENOUS
  Filled 2013-07-15: qty 2

## 2013-07-15 MED ORDER — HYDROMORPHONE HCL PF 1 MG/ML IJ SOLN
1.0000 mg | Freq: Once | INTRAMUSCULAR | Status: AC
  Administered 2013-07-15: 1 mg via INTRAVENOUS
  Filled 2013-07-15: qty 1

## 2013-07-15 NOTE — ED Notes (Signed)
Patient O2 sat stayed in high 90's while ambulating, however patient was only able to ambulate about 15 ft total

## 2013-07-15 NOTE — ED Notes (Signed)
Pt states that he has been having leg swelling for two weeks.  Pt states he has been to [facility] multiple times, but they will only give him lasix and send him home.

## 2013-07-15 NOTE — ED Notes (Signed)
Patient transported to X-ray 

## 2013-07-16 ENCOUNTER — Encounter (HOSPITAL_COMMUNITY): Payer: Self-pay | Admitting: Radiology

## 2013-07-16 ENCOUNTER — Observation Stay (HOSPITAL_COMMUNITY): Payer: Medicare Other

## 2013-07-16 DIAGNOSIS — D509 Iron deficiency anemia, unspecified: Secondary | ICD-10-CM

## 2013-07-16 DIAGNOSIS — M79605 Pain in left leg: Secondary | ICD-10-CM

## 2013-07-16 DIAGNOSIS — I10 Essential (primary) hypertension: Secondary | ICD-10-CM

## 2013-07-16 DIAGNOSIS — M79604 Pain in right leg: Secondary | ICD-10-CM | POA: Diagnosis present

## 2013-07-16 DIAGNOSIS — R609 Edema, unspecified: Secondary | ICD-10-CM

## 2013-07-16 DIAGNOSIS — M79609 Pain in unspecified limb: Secondary | ICD-10-CM

## 2013-07-16 DIAGNOSIS — F1410 Cocaine abuse, uncomplicated: Secondary | ICD-10-CM

## 2013-07-16 DIAGNOSIS — I509 Heart failure, unspecified: Secondary | ICD-10-CM

## 2013-07-16 DIAGNOSIS — I2789 Other specified pulmonary heart diseases: Secondary | ICD-10-CM

## 2013-07-16 DIAGNOSIS — I5023 Acute on chronic systolic (congestive) heart failure: Secondary | ICD-10-CM

## 2013-07-16 DIAGNOSIS — I369 Nonrheumatic tricuspid valve disorder, unspecified: Secondary | ICD-10-CM

## 2013-07-16 LAB — CBC WITH DIFFERENTIAL/PLATELET
BASOS PCT: 0 % (ref 0–1)
Basophils Absolute: 0 10*3/uL (ref 0.0–0.1)
Eosinophils Absolute: 0.1 10*3/uL (ref 0.0–0.7)
Eosinophils Relative: 2 % (ref 0–5)
HEMATOCRIT: 26 % — AB (ref 39.0–52.0)
Hemoglobin: 7.8 g/dL — ABNORMAL LOW (ref 13.0–17.0)
Lymphocytes Relative: 8 % — ABNORMAL LOW (ref 12–46)
Lymphs Abs: 0.5 10*3/uL — ABNORMAL LOW (ref 0.7–4.0)
MCH: 25.2 pg — ABNORMAL LOW (ref 26.0–34.0)
MCHC: 30 g/dL (ref 30.0–36.0)
MCV: 84.1 fL (ref 78.0–100.0)
MONO ABS: 0.9 10*3/uL (ref 0.1–1.0)
MONOS PCT: 15 % — AB (ref 3–12)
NEUTROS ABS: 4.3 10*3/uL (ref 1.7–7.7)
Neutrophils Relative %: 75 % (ref 43–77)
Platelets: 345 10*3/uL (ref 150–400)
RBC: 3.09 MIL/uL — ABNORMAL LOW (ref 4.22–5.81)
RDW: 18.5 % — ABNORMAL HIGH (ref 11.5–15.5)
WBC: 5.8 10*3/uL (ref 4.0–10.5)

## 2013-07-16 LAB — TROPONIN I
Troponin I: <0.3 ng/mL (ref <0.30)
Troponin I: <0.3 ng/mL (ref <0.30)
Troponin I: <0.3 ng/mL (ref <0.30)

## 2013-07-16 LAB — COMPREHENSIVE METABOLIC PANEL
ALBUMIN: 3.1 g/dL — AB (ref 3.5–5.2)
ALT: 22 U/L (ref 0–53)
AST: 46 U/L — ABNORMAL HIGH (ref 0–37)
Alkaline Phosphatase: 73 U/L (ref 39–117)
BUN: 23 mg/dL (ref 6–23)
CALCIUM: 8.6 mg/dL (ref 8.4–10.5)
CO2: 23 mEq/L (ref 19–32)
Chloride: 102 mEq/L (ref 96–112)
Creatinine, Ser: 1.2 mg/dL (ref 0.50–1.35)
GFR calc non Af Amer: 60 mL/min — ABNORMAL LOW (ref >90)
GFR, EST AFRICAN AMERICAN: 69 mL/min — AB (ref >90)
Glucose, Bld: 102 mg/dL — ABNORMAL HIGH (ref 70–99)
Potassium: 4 mEq/L (ref 3.7–5.3)
Sodium: 140 mEq/L (ref 137–147)
TOTAL PROTEIN: 7.3 g/dL (ref 6.0–8.3)
Total Bilirubin: 1.1 mg/dL (ref 0.3–1.2)

## 2013-07-16 LAB — MRSA PCR SCREENING: MRSA BY PCR: POSITIVE — AB

## 2013-07-16 LAB — RAPID URINE DRUG SCREEN, HOSP PERFORMED
AMPHETAMINES: NOT DETECTED
BARBITURATES: NOT DETECTED
BENZODIAZEPINES: NOT DETECTED
Cocaine: POSITIVE — AB
Opiates: NOT DETECTED
Tetrahydrocannabinol: NOT DETECTED

## 2013-07-16 LAB — TYPE AND SCREEN
ABO/RH(D): O POS
Antibody Screen: NEGATIVE

## 2013-07-16 LAB — IRON AND TIBC: IRON: 329 ug/dL — AB (ref 42–135)

## 2013-07-16 LAB — VITAMIN B12: Vitamin B-12: 576 pg/mL (ref 211–911)

## 2013-07-16 LAB — RETICULOCYTES
RBC.: 2.74 MIL/uL — ABNORMAL LOW (ref 4.22–5.81)
Retic Count, Absolute: 87.7 10*3/uL (ref 19.0–186.0)
Retic Ct Pct: 3.2 % — ABNORMAL HIGH (ref 0.4–3.1)

## 2013-07-16 LAB — FERRITIN: Ferritin: 24 ng/mL (ref 22–322)

## 2013-07-16 LAB — FOLATE: Folate: 18.3 ng/mL

## 2013-07-16 LAB — D-DIMER, QUANTITATIVE: D-Dimer, Quant: 1.05 ug/mL-FEU — ABNORMAL HIGH (ref 0.00–0.48)

## 2013-07-16 MED ORDER — SODIUM CHLORIDE 0.9 % IJ SOLN
3.0000 mL | Freq: Two times a day (BID) | INTRAMUSCULAR | Status: DC
  Administered 2013-07-16 – 2013-07-20 (×9): 3 mL via INTRAVENOUS

## 2013-07-16 MED ORDER — SIMVASTATIN 40 MG PO TABS
40.0000 mg | ORAL_TABLET | Freq: Every evening | ORAL | Status: DC
  Administered 2013-07-16 – 2013-07-20 (×5): 40 mg via ORAL
  Filled 2013-07-16 (×6): qty 1

## 2013-07-16 MED ORDER — CARVEDILOL 3.125 MG PO TABS
3.1250 mg | ORAL_TABLET | Freq: Two times a day (BID) | ORAL | Status: DC
  Administered 2013-07-16: 3.125 mg via ORAL
  Filled 2013-07-16 (×4): qty 1

## 2013-07-16 MED ORDER — POTASSIUM CHLORIDE ER 10 MEQ PO TBCR
10.0000 meq | EXTENDED_RELEASE_TABLET | Freq: Every day | ORAL | Status: DC
  Administered 2013-07-16 – 2013-07-19 (×4): 10 meq via ORAL
  Filled 2013-07-16 (×5): qty 1

## 2013-07-16 MED ORDER — FUROSEMIDE 10 MG/ML IJ SOLN
8.0000 mg/h | INTRAVENOUS | Status: DC
  Administered 2013-07-16: 8 mg/h via INTRAVENOUS
  Filled 2013-07-16 (×3): qty 25

## 2013-07-16 MED ORDER — AMLODIPINE BESYLATE 5 MG PO TABS
5.0000 mg | ORAL_TABLET | Freq: Every day | ORAL | Status: DC
  Administered 2013-07-16: 5 mg via ORAL
  Filled 2013-07-16: qty 1

## 2013-07-16 MED ORDER — PHENYTOIN SODIUM EXTENDED 100 MG PO CAPS
200.0000 mg | ORAL_CAPSULE | Freq: Every day | ORAL | Status: DC
  Administered 2013-07-16 – 2013-07-21 (×6): 200 mg via ORAL
  Filled 2013-07-16 (×6): qty 2

## 2013-07-16 MED ORDER — DILTIAZEM HCL ER COATED BEADS 120 MG PO CP24
120.0000 mg | ORAL_CAPSULE | Freq: Every day | ORAL | Status: DC
  Administered 2013-07-16: 120 mg via ORAL
  Filled 2013-07-16: qty 1

## 2013-07-16 MED ORDER — ISOSORB DINITRATE-HYDRALAZINE 20-37.5 MG PO TABS
1.0000 | ORAL_TABLET | Freq: Two times a day (BID) | ORAL | Status: DC
  Administered 2013-07-16 – 2013-07-18 (×5): 1 via ORAL
  Filled 2013-07-16 (×6): qty 1

## 2013-07-16 MED ORDER — LISINOPRIL 20 MG PO TABS: 20.0000 mg | ORAL_TABLET | Freq: Two times a day (BID) | ORAL | Status: DC

## 2013-07-16 MED ORDER — SPIRONOLACTONE 50 MG PO TABS
50.0000 mg | ORAL_TABLET | Freq: Every day | ORAL | Status: DC
  Administered 2013-07-16 – 2013-07-21 (×6): 50 mg via ORAL
  Filled 2013-07-16 (×6): qty 1

## 2013-07-16 MED ORDER — HYDRALAZINE HCL 50 MG PO TABS
100.0000 mg | ORAL_TABLET | Freq: Three times a day (TID) | ORAL | Status: DC
  Administered 2013-07-16 (×2): 100 mg via ORAL
  Filled 2013-07-16 (×5): qty 2

## 2013-07-16 MED ORDER — FERROUS SULFATE 325 (65 FE) MG PO TABS
325.0000 mg | ORAL_TABLET | Freq: Two times a day (BID) | ORAL | Status: DC
  Administered 2013-07-16 – 2013-07-21 (×11): 325 mg via ORAL
  Filled 2013-07-16 (×13): qty 1

## 2013-07-16 MED ORDER — MUPIROCIN 2 % EX OINT
1.0000 | TOPICAL_OINTMENT | Freq: Two times a day (BID) | CUTANEOUS | Status: DC
Start: 2013-07-16 — End: 2013-07-21
  Administered 2013-07-16 – 2013-07-21 (×12): 1 via NASAL
  Filled 2013-07-16: qty 22

## 2013-07-16 MED ORDER — PANTOPRAZOLE SODIUM 40 MG PO TBEC
40.0000 mg | DELAYED_RELEASE_TABLET | Freq: Two times a day (BID) | ORAL | Status: DC
  Administered 2013-07-16 – 2013-07-21 (×12): 40 mg via ORAL
  Filled 2013-07-16 (×10): qty 1

## 2013-07-16 MED ORDER — IOHEXOL 350 MG/ML SOLN
100.0000 mL | Freq: Once | INTRAVENOUS | Status: AC | PRN
  Administered 2013-07-16: 100 mL via INTRAVENOUS

## 2013-07-16 MED ORDER — PHENYTOIN SODIUM EXTENDED 100 MG PO CAPS: 200.0000 mg | ORAL_CAPSULE | Freq: Two times a day (BID) | ORAL | Status: DC

## 2013-07-16 MED ORDER — DIVALPROEX SODIUM 500 MG PO DR TAB
500.0000 mg | DELAYED_RELEASE_TABLET | Freq: Two times a day (BID) | ORAL | Status: DC
  Administered 2013-07-16 – 2013-07-21 (×12): 500 mg via ORAL
  Filled 2013-07-16 (×13): qty 1

## 2013-07-16 MED ORDER — CHLORHEXIDINE GLUCONATE CLOTH 2 % EX PADS
6.0000 | MEDICATED_PAD | Freq: Every day | CUTANEOUS | Status: AC
Start: 2013-07-16 — End: 2013-07-20
  Administered 2013-07-16 – 2013-07-20 (×5): 6 via TOPICAL

## 2013-07-16 MED ORDER — CLONIDINE HCL 0.2 MG PO TABS
0.2000 mg | ORAL_TABLET | Freq: Two times a day (BID) | ORAL | Status: DC
  Administered 2013-07-16 (×3): 0.2 mg via ORAL
  Filled 2013-07-16 (×5): qty 1

## 2013-07-16 MED ORDER — FUROSEMIDE 10 MG/ML IJ SOLN
40.0000 mg | Freq: Two times a day (BID) | INTRAMUSCULAR | Status: DC
  Administered 2013-07-16: 40 mg via INTRAVENOUS
  Filled 2013-07-16 (×3): qty 4

## 2013-07-16 MED ORDER — LISINOPRIL 20 MG PO TABS
20.0000 mg | ORAL_TABLET | Freq: Two times a day (BID) | ORAL | Status: DC
  Administered 2013-07-17 – 2013-07-21 (×9): 20 mg via ORAL
  Filled 2013-07-16 (×11): qty 1

## 2013-07-16 MED ORDER — ISOSORBIDE MONONITRATE ER 30 MG PO TB24
30.0000 mg | ORAL_TABLET | Freq: Every day | ORAL | Status: DC
  Administered 2013-07-16: 30 mg via ORAL
  Filled 2013-07-16: qty 1

## 2013-07-16 MED ORDER — MORPHINE SULFATE 2 MG/ML IJ SOLN
2.0000 mg | INTRAMUSCULAR | Status: DC | PRN
  Administered 2013-07-16 – 2013-07-20 (×14): 2 mg via INTRAVENOUS
  Filled 2013-07-16 (×14): qty 1

## 2013-07-16 MED ORDER — PHENYTOIN SODIUM EXTENDED 100 MG PO CAPS
300.0000 mg | ORAL_CAPSULE | Freq: Every day | ORAL | Status: DC
  Administered 2013-07-16 – 2013-07-20 (×5): 300 mg via ORAL
  Filled 2013-07-16 (×6): qty 3

## 2013-07-16 NOTE — Progress Notes (Signed)
CRITICAL VALUE ALERT  Critical value received:  MRSA PCR positive  Date of notification:  07/16/2013   Time of notification:  4:53 AM   Critical value read back:yes  Nurse who received alert:  Ronnette Hila, RN   MD notified (1st page):    Time of first page:    MD notified (2nd page):  Time of second page:  Responding MD:    Time MD responded:

## 2013-07-16 NOTE — Progress Notes (Signed)
Pt had 5 beats run of VT.  BP136/74, P80.  Noted pt was asleep & asymptomatic.  Will informed Dr Durel Salts.  Karie Kirks, Therapist, sports.

## 2013-07-16 NOTE — Progress Notes (Signed)
Pt arrived to floor, complaining of bilateral leg pain. Pt BP elevated 178/98, pt given hydralazine, awaiting clonidine from pharmacy. Pt also given morphine for pain. Pt oriented to room and floor. Will continue to monitor. Ronnette Hila, RN

## 2013-07-16 NOTE — Progress Notes (Addendum)
Dr. Durel Salts made aware of pt having 5 beats run of VT and asymptomatic.  No new order.  Karie Kirks, Therapist, sports.

## 2013-07-16 NOTE — Progress Notes (Signed)
Bilateral lower extremity venous duplex:  No obvious evidence of DVT, superficial thrombosis, or Baker's cyst.  Technically difficult study due to the edema and the patient's guarding secondary to the pain.

## 2013-07-16 NOTE — ED Provider Notes (Signed)
CSN: [id number]     Arrival date & time 07/15/13  2105 History   First MD Initiated Contact with Patient 07/15/13 2156     Chief Complaint  Patient presents with  . Leg Swelling     (Consider location/radiation/quality/duration/timing/severity/associated sxs/prior Treatment) HPI Comments: Patient reports bilateral leg swelling for the past 2 weeks. He states he's been evaluated multiple times at [facility] for the same and treated with Lasix and discharged. Reports bilateral pain in his leg which is new today. Denies any chest pain or shortness of breath. States compliance with his medications. Denies any fever, cough, abdominal pain, nausea or vomiting. The pain is diffuse from his hips although he does do his feet. It is tender to touch. Denies any falls or trauma. Admits to last cocaine use 2 days ago.  The history is provided by the patient and the EMS personnel.    Past Medical History  Diagnosis Date  . Iron deficiency anemia   . Hypertension   . Chronic systolic heart failure     2D Echo (03/2010) - EF 35-40% with akinesis of inferoposterior myocardium  . CAD (coronary artery disease)     s/p 3-vessel CABG (12/2007) // 100% RCA stenosis with collaterals from left system. Severe bifurcation lesions of proxima CXA and OM. Moderate LAD diseaseFollowed by Dr. Wyline Copas in [facility]  . Hyperlipidemia LDL goal <70   . Obstructive sleep apnea     not on home CPAP  . Ischemic cardiomyopathy   . Chronic kidney disease (CKD), stage III (moderate)     BL SCr 1.5-1.6  . Homelessness   . 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 // Colonoscopy and EGD in 03/2010 negative per report (record not found)  . 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 Polysubstance abuse     cocaine, THC  . AV malformation of gastrointestinal tract    . Family history of early CAD   . DDD (degenerative disc disease), lumbar   . Renal artery stenosis   . Peripheral vascular disease   . Seizure disorder   . Diabetes   . Acute on chronic systolic CHF (congestive heart failure) 08/13/2012  . Myocardial infarction   . Seizures    Past Surgical History  Procedure Laterality Date  . Coronary artery bypass graft  12/2007  . Apc  03/2010    To treat small bowel AVMs  . Esophagogastroduodenoscopy N/A 08/14/2012    Procedure: ESOPHAGOGASTRODUODENOSCOPY (EGD);  Surgeon: [name], MD;  Location: [facility] ENDOSCOPY;  Service: Endoscopy;  Laterality: N/A;  . Hot hemostasis N/A 08/14/2012    Procedure: HOT HEMOSTASIS (ARGON PLASMA COAGULATION/BICAP);  Surgeon: [name], MD;  Location: [facility] ENDOSCOPY;  Service: Endoscopy;  Laterality: N/A;   Family History  Problem Relation Age of Onset  . Heart disease Mother     unknown type  . Heart disease Father 34    died of MI at 87yo  . Heart disease Paternal Grandfather 40    died of MI  . Heart disease    . Heart disease Brother 30   History  Substance Use Topics  . Smoking status: Current Every Day Smoker -- 0.20 packs/day for 13 years    Types: Cigarettes    Last Attempt to Quit: 03/20/1997  . Smokeless tobacco: Never Used  .  Alcohol Use: Yes     Comment: occasionally drinks, a few times a month    Review of Systems  Constitutional: Negative for fever, activity change and appetite change.  HENT: Negative for congestion and voice change.   Respiratory: Negative for cough, chest tightness and shortness of breath.   Cardiovascular: Positive for leg swelling. Negative for chest pain.  Gastrointestinal: Negative for nausea, vomiting, abdominal pain and anal bleeding.  Genitourinary: Negative for dysuria and hematuria.  Musculoskeletal: Negative for arthralgias, back pain and myalgias.  Skin: Negative for rash.  Neurological: Negative for dizziness, weakness, light-headedness and headaches.  A  complete 10 system review of systems was obtained and all systems are negative except as noted in the HPI and PMH.      Allergies  Motrin and Tylenol  Home Medications   Prior to Admission medications   Medication Sig Start Date End Date Taking? Authorizing Provider  amLODipine (NORVASC) 5 MG tablet Take 1 tablet (5 mg total) by mouth daily. 03/13/13  Yes [name], MD  cloNIDine (CATAPRES) 0.2 MG tablet Take 0.2 mg by mouth 2 (two) times daily.   Yes Historical Provider, MD  diltiazem (CARDIZEM CD) 120 MG 24 hr capsule Take 1 capsule (120 mg total) by mouth daily. 03/13/13  Yes [name], MD  divalproex (DEPAKOTE) 500 MG DR tablet Take 1 tablet (500 mg total) by mouth 2 (two) times daily. 03/13/13  Yes [name], MD  ferrous sulfate 325 (65 FE) MG tablet Take 1 tablet (325 mg total) by mouth 2 (two) times daily with a meal. 03/13/13  Yes [name], MD  furosemide (LASIX) 40 MG tablet Take 1 tablet (40 mg total) by mouth daily. 03/13/13  Yes [name], MD  hydrALAZINE (APRESOLINE) 100 MG tablet Take 1 tablet (100 mg total) by mouth every 8 (eight) hours. 03/13/13  Yes [name], MD  isosorbide mononitrate (IMDUR) 30 MG 24 hr tablet Take 1 tablet (30 mg total) by mouth daily. 03/13/13  Yes [name], MD  lisinopril (PRINIVIL,ZESTRIL) 20 MG tablet Take 1 tablet (20 mg total) by mouth 2 (two) times daily. 03/13/13  Yes [name], MD  mupirocin ointment (BACTROBAN) 2 % Place 1 application into the nose 2 (two) times daily. 03/13/13  Yes [name], MD  pantoprazole (PROTONIX) 40 MG tablet Take 1 tablet (40 mg total) by mouth 2 (two) times daily. 03/19/13  Yes Verlee Monte, MD  phenytoin (DILANTIN) 100 MG ER capsule Take 2-3 capsules (200-300 mg total) by mouth 2 (two) times daily. Take 2 capsules by mouth in the morning and 3 capsules by mouth at bedtime. 03/13/13  Yes [name], MD  potassium chloride (K-DUR) 10 MEQ tablet Take  1 tablet (10 mEq total) by mouth daily. 03/13/13  Yes [name], MD  simvastatin (ZOCOR) 40 MG tablet Take 1 tablet (40 mg total) by mouth every evening. 03/13/13  Yes [name], MD  spironolactone (ALDACTONE) 50 MG tablet Take 1 tablet (50 mg total) by mouth daily. 03/13/13  Yes [name], MD   BP 166/89  Pulse 84  Temp(Src) 98.2 F (36.8 C) (Oral)  Resp 20  Ht 5\' 5"  (1.651 m)  Wt 186 lb 1.6 oz (84.414 kg)  BMI 30.97 kg/m2  SpO2 90% Physical Exam  Constitutional: He is oriented to person, place, and time. He appears well-developed. No distress.  HENT:  Head: Normocephalic and atraumatic.  Mouth/Throat: Oropharynx  is clear and moist. No oropharyngeal exudate.  Eyes: Conjunctivae and EOM are normal. Pupils are equal, round, and reactive to light.  Neck: Normal range of motion. Neck supple.  Cardiovascular: Normal rate, regular rhythm and normal heart sounds.   No murmur heard. Pulmonary/Chest: Effort normal and breath sounds normal. No respiratory distress.  Abdominal: Soft. There is no tenderness. There is no rebound and no guarding.  Musculoskeletal: Normal range of motion. He exhibits edema and tenderness.  Edema to bilateral legs from hips to ankles Tenderness diffusely to palpation somewhat out of proportion to exam. dopplerable DP pulses bilaterally  Neurological: He is alert and oriented to person, place, and time. No cranial nerve deficit. He exhibits normal muscle tone. Coordination normal.  Skin: Skin is warm.    ED Course  Procedures (including critical care time) Labs Review Labs Reviewed  MRSA PCR SCREENING - Abnormal; Notable for the following:    MRSA by PCR POSITIVE (*)    All other components within normal limits  CBC WITH DIFFERENTIAL - Abnormal; Notable for the following:    RBC 2.98 (*)    Hemoglobin 7.4 (*)    HCT 24.3 (*)    MCH 24.8 (*)    RDW 18.5 (*)    All other components within normal limits  PRO B NATRIURETIC PEPTIDE -  Abnormal; Notable for the following:    Pro B Natriuretic peptide (BNP) 4293.0 (*)    All other components within normal limits  COMPREHENSIVE METABOLIC PANEL - Abnormal; Notable for the following:    Albumin 2.8 (*)    AST 43 (*)    GFR calc non Af Amer 70 (*)    GFR calc Af Amer 81 (*)    All other components within normal limits  PROTIME-INR - Abnormal; Notable for the following:    Prothrombin Time 15.3 (*)    All other components within normal limits  D-DIMER, QUANTITATIVE - Abnormal; Notable for the following:    D-Dimer, Quant 1.05 (*)    All other components within normal limits  COMPREHENSIVE METABOLIC PANEL - Abnormal; Notable for the following:    Glucose, Bld 102 (*)    Albumin 3.1 (*)    AST 46 (*)    GFR calc non Af Amer 60 (*)    GFR calc Af Amer 69 (*)    All other components within normal limits  CBC WITH DIFFERENTIAL - Abnormal; Notable for the following:    RBC 3.09 (*)    Hemoglobin 7.8 (*)    HCT 26.0 (*)    MCH 25.2 (*)    RDW 18.5 (*)    Lymphocytes Relative 8 (*)    Lymphs Abs 0.5 (*)    Monocytes Relative 15 (*)    All other components within normal limits  URINE RAPID DRUG SCREEN (HOSP PERFORMED) - Abnormal; Notable for the following:    Cocaine POSITIVE (*)    All other components within normal limits  TROPONIN I  TROPONIN I  TROPONIN I  OCCULT BLOOD X 1 CARD TO LAB, STOOL  TYPE AND SCREEN    Imaging Review Dg Chest 2 View  07/15/2013   CLINICAL DATA:  Shortness of breath and lower extremity edema at  EXAM: CHEST  2 VIEW  COMPARISON:  DG CHEST 1V PORT dated 03/21/2013  FINDINGS: The cardiopericardial silhouette remains enlarged. The pulmonary vascularity is more engorged and less distinct today than on the previous study. The pulmonary interstitial markings also are mildly increased especially on  the right. There is no significant pleural effusion. The patient has undergone previous CABG.  IMPRESSION: The findings are consistent with CHF with mild  pulmonary interstitial edema.   Electronically Signed   By: David  Martinique   On: 07/15/2013 23:27   Ct Angio Ao+bifem W/cm &/or Wo/cm  07/16/2013   CLINICAL DATA:  Left leg pain and swelling.  EXAM: CT ANGIOGRAPHY OF ABDOMINAL AORTA WITH ILIOFEMORAL RUNOFF  TECHNIQUE: Multidetector CT imaging of the abdomen, pelvis and lower extremities was performed using the standard protocol during bolus administration of intravenous contrast. Multiplanar CT image reconstructions and MIPs were obtained to evaluate the vascular anatomy.  CONTRAST:  125mL OMNIPAQUE IOHEXOL 350 MG/ML SOLN  COMPARISON:  CT of the abdomen and pelvis from 03/24/2010, and abdominal ultrasound performed 04/18/2010  FINDINGS: Aorta: Relatively diffuse calcification is seen along the abdominal aorta and its branches. The celiac trunk, superior mesenteric artery, bilateral renal arteries and inferior mesenteric artery remain patent, with mild calcific atherosclerotic disease seen along the superior mesenteric artery and bilateral renal arteries. There is likely moderate luminal narrowing at the proximal right common iliac artery, and severe luminal narrowing at the proximal left common iliac artery.  Calcific atherosclerotic disease is somewhat worse at the left common iliac artery, with additional moderate to severe luminal narrowing along its course. There is also moderate to severe luminal narrowing at the bifurcation of the left common iliac artery. Flow is still seen to the level of the common femoral arteries bilaterally.  Right Lower Extremity: Scattered calcific atherosclerotic disease is noted along the visualized vasculature. There is likely mild to moderate luminal narrowing involving the proximal profunda femoris. The right superficial femoral artery appears to occlude at the level of the mid thigh, though it reconstitutes more distally, just proximal to the level of the knee. This may reflect reconstitution of flow from collateral vessels, or  trace nonvisualized flow along the apparently occluded segment.  Calcific atherosclerotic disease is noted along the distal popliteal artery. There is apparent three vessel runoff to the right ankle, though the posterior tibial artery is less well characterized just proximal to the level of the ankle.  Left Lower Extremity: Scattered calcific atherosclerotic disease is noted along the visualized vasculature. There is likely moderate luminal narrowing involving the proximal profunda femoris. There is apparent occlusion of the left superficial femoral artery at the level of the mid thigh, as on the right, with reconstitution noted just proximal to the level of the knee. As on the right, this may reflect reconstitution of flow from collateral vessels, with trace nonvisualized flow along the apparently occluded segment.  There is mild scattered calcific atherosclerotic disease along the proximal branches of the left popliteal artery. Patent three vessel runoff is seen to the level of the ankle.  Additional Findings:  A trace left pleural effusion is noted.  Trace ascites is noted surrounding the liver, tracking along the right paracolic gutter.  The liver and spleen are grossly unremarkable in appearance. The gallbladder is within normal limits. The pancreas and adrenal glands are unremarkable.  Nonspecific perinephric stranding is noted bilaterally. The kidneys are otherwise unremarkable in appearance. There is no evidence of hydronephrosis. No renal or ureteral stones are seen.  No free fluid is identified. The small bowel is unremarkable in appearance. The stomach is within normal limits. No acute vascular abnormalities are seen.  The appendix is normal in caliber and contains air, without evidence for appendicitis. The colon is unremarkable in appearance.  The bladder  is mildly distended. Mild bladder wall thickening is nonspecific and may be chronic in nature. The prostate remains normal in size. No inguinal  lymphadenopathy is seen.  Note is made of diffuse bilateral lower extremity edema, slightly worse on the left. This is relatively superficial in nature. No underlying focal fluid collection is seen. Edema resolves at the level of the ankles, though trace edema is noted at both feet.  No acute osseous abnormalities are identified. Multilevel vacuum phenomenon is noted at the lower lumbar spine.  Review of the MIP images confirms the above findings.  IMPRESSION: 1. Vascular findings as described above. 2. The superficial femoral arteries appear to exclude bilaterally at the level of the mid thighs, though they reconstitute more distally, just proximal to the level of the knees. This may reflect reconstitution of flow from collateral vessels, or trace nonvisualized flow along the apparently occluded segments. 3. Calcific atherosclerotic disease is somewhat worsened at the left common iliac artery, with moderate luminal narrowing at the proximal right common iliac artery, and severe luminal narrowing at multiple locations along the left common iliac artery. 4. Following reconstitution of the popliteal arteries, there appears to be patent three vessel runoff to both lower extremities, though the right posterior tibial artery is difficult to fully characterize just proximal to the level of the ankle. 5. Diffuse bilateral lower extremity edema, slightly worse on the left. This is relatively superficial in nature. No underlying focal fluid collection seen. Edema resolves at the level of the ankles, though trace edema is seen at both feet. 6. Trace ascites noted surrounding the liver, tracking along the right paracolic gutter. 7. Trace left pleural effusion noted. 8. Mild bladder wall thickening is nonspecific and may be chronic in nature.   Electronically Signed   By: Garald Balding M.D.   On: 07/16/2013 02:46     EKG Interpretation   Date/Time:  Tuesday July 15 2013 21:13:30 EDT Ventricular Rate:  76 PR Interval:   171 QRS Duration: 113 QT Interval:  398 QTC Calculation: 447 R Axis:   80 Text Interpretation:  Sinus rhythm Ventricular premature complex  Borderline intraventricular conduction delay RSR' in V1 or V2, probably  normal variant Nonspecific repol abnormality, diffuse leads No significant  change was found Confirmed by Wyvonnia Dusky  MD, Jahmiyah Dullea [phone]) on 07/15/2013  10:36:19 PM      MDM   Final diagnoses:  CHF exacerbation  Leg pain   hx/o cocaine abuse, GI bleed, systolic CHF, CAD s/p CABG, stage 3 CKD presenting with bilateral leg pain  Tender LE edema, pulses present with doppler.  Concern for CHF exacerbation, claudication, DVT. Hemoglobin 7.4, slight down from baseline.  Per records patient with EGD at OSH that showed oozing.  Cr at baseline. Congestion on CXR with BNP 4000. desatruation to 91% with ambulation. Will diuresis for clinical CHF exacerbation.  D/w Dr. Ernestina Patches getting CT angio to evaluate for claudication. He agrees. Patent 3 vessel runoff in both extremities with multiple area of stenosis in iliac arteries and SFA apparent occlusion with distal reconstitution.  Ezequiel Essex, MD 07/16/13 [phone]

## 2013-07-16 NOTE — Progress Notes (Signed)
UR Completed.  Charles [name] Charles [name] [phone] 07/16/2013  

## 2013-07-16 NOTE — Progress Notes (Signed)
  Echocardiogram 2D Echocardiogram has been performed.  Valinda Hoar 07/16/2013, 12:13 PM

## 2013-07-16 NOTE — H&P (Addendum)
Hospitalist Admission History and Physical  Patient name: Charles [name] Medical record number: [id number] Date of birth: [birth year] Age: 68 y.o. Gender: male  Primary Care Provider: [name]  Chief Complaint: bilateral leg pain   History of Present Illness:This is a 68 y.o. year old male with prior hx/o cocaine abuse, GI bleed, systolic CHF, CAD s/p  CABG, stage 3 CKD presenting with bilateral leg pain. Patient states he's had progressive lower extremity swelling over the past one to 2 weeks. Was seen in the [place] within the last week for symptoms. Was given IV Lasix with improvement in swelling. Patient states the swelling has persisted since this point would having worsening pain in the lower extremities bilaterally. Swelling has seemed to have worsened going above the knees. Denies any NSAID use. Has been compliant with outpatient hypertension regimen. Denies high salt intake. Has used cocaine within the past 2 days. He states that he uses to help with pain. However, pain has seemed to worsen since using. Denies any chest pain, nausea, diaphoresis. States that he has had some intermittent black stools over the past week. In the ER, patient hemodynamically stable. Satting in the upper 90s at rest, though oxygen does drop to the low 90s with ambulation. Chest x-ray showed findings consistent with CHF and mild pulmonary edema. Pro BNP 4300. Hemoglobin is 7.4. Creatinine at one. Distal pulses were auscultated by Doppler but your physician. CT angiogram of femorals with runoff is pending to evaluate for claudication. Patient Active Problem List   Diagnosis Date Noted  . Leg pain, bilateral 07/16/2013  . Convulsions/seizures 03/09/2013  . Cocaine abuse 03/09/2013  . NSTEMI (non-ST elevated myocardial infarction) 03/09/2013  . Chest pain 03/08/2013  . Bradycardia 08/14/2012  . Acute on chronic systolic CHF (congestive heart failure) 08/13/2012  . Chest pain, midsternal 08/13/2012   . Unstable angina 06/14/2012  . High risk sexual behavior 06/14/2012  . Iron deficiency anemia   . Hypertension   . Chronic systolic heart failure   . CAD (coronary artery disease)   . Ischemic cardiomyopathy   . Chronic kidney disease (CKD), stage III (moderate)   . Homelessness   . Polysubstance abuse   . Family history of early CAD   . DDD (degenerative disc disease), lumbar   . Renal artery stenosis   . Peripheral vascular disease   . Seizure disorder   . Diabetes   . GI bleed 03/20/2010  . Moderate to severe pulmonary hypertension 03/20/2010   Past Medical History: Past Medical History  Diagnosis Date  . Iron deficiency anemia   . Hypertension   . Chronic systolic heart failure     2D Echo (03/2010) - EF 35-40% with akinesis of inferoposterior myocardium  . CAD (coronary artery disease)     s/p 3-vessel CABG (12/2007) // 100% RCA stenosis with collaterals from left system. Severe bifurcation lesions of proxima CXA and OM. Moderate LAD diseaseFollowed by Dr. Wyline Copas in [facility]  . Hyperlipidemia LDL goal <70   . Obstructive sleep apnea     not on home CPAP  . Ischemic cardiomyopathy   . Chronic kidney disease (CKD), stage III (moderate)     BL SCr 1.5-1.6  . Homelessness   . 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 // Colonoscopy and EGD in 03/2010 negative per report (record not found)  . Moderate to severe pulmonary hypertension 03/2010  PA peak pressure of 76 mmHg (per 2D Echo 03/2010)  . Polysubstance abuse     cocaine, THC  . AV malformation of gastrointestinal tract   . Family history of early CAD   . DDD (degenerative disc disease), lumbar   . Renal artery stenosis   . Peripheral vascular disease   . Seizure disorder   . Diabetes   . Acute on chronic systolic CHF (congestive heart failure) 08/13/2012  . Myocardial infarction   . Seizures     Past Surgical  History: Past Surgical History  Procedure Laterality Date  . Coronary artery bypass graft  12/2007  . Apc  03/2010    To treat small bowel AVMs  . Esophagogastroduodenoscopy N/A 08/14/2012    Procedure: ESOPHAGOGASTRODUODENOSCOPY (EGD);  Surgeon: [name], MD;  Location: [facility] ENDOSCOPY;  Service: Endoscopy;  Laterality: N/A;  . Hot hemostasis N/A 08/14/2012    Procedure: HOT HEMOSTASIS (ARGON PLASMA COAGULATION/BICAP);  Surgeon: [name], MD;  Location[facility] ENDOSCOPY;  Service: Endoscopy;  Laterality: N/A;    Social History: History   Social History  . Marital Status: Single    Spouse Name: N/A    Number of Children: 2  . Years of Education: N/A   Occupational History  . Unemployed     previously worked in [employer]  .     Social History Main Topics  . Smoking status: Current Every Day Smoker -- 0.20 packs/day for 13 years    Types: Cigarettes    Last Attempt to Quit: 03/20/1997  . Smokeless tobacco: Never Used  . Alcohol Use: Yes     Comment: occasionally drinks, a few times a month  . Drug Use: Yes    Special: Cocaine, Marijuana  . Sexual Activity: No   Other Topics Concern  . None   Social History Narrative   Lives in [place]. Originally from [place], has been in the [place] since [place]                   Family History: Family History  Problem Relation Age of Onset  . Heart disease Mother     unknown type  . Heart disease Father 7    died of MI at 50yo  . Heart disease Paternal Grandfather 27    died of MI  . Heart disease    . Heart disease Brother 30    Allergies: Allergies  Allergen Reactions  . Motrin [Ibuprofen] Other (See Comments)    Affects kidneys  . Tylenol [Acetaminophen] Other (See Comments)    Affects kidneys    Current Facility-Administered Medications  Medication Dose Route Frequency Provider Last Rate Last Dose  . amLODipine (NORVASC) tablet 5 mg  5 mg Oral Daily Shanda Howells, MD      . cloNIDine  (CATAPRES) tablet 0.2 mg  0.2 mg Oral BID Shanda Howells, MD      . diltiazem (CARDIZEM CD) 24 hr capsule 120 mg  120 mg Oral Daily Shanda Howells, MD      . divalproex (DEPAKOTE) DR tablet 500 mg  500 mg Oral BID Shanda Howells, MD      . ferrous sulfate tablet 325 mg  325 mg Oral BID WC Shanda Howells, MD      . hydrALAZINE (APRESOLINE) tablet 100 mg  100 mg Oral 3 times per day Shanda Howells, MD      . isosorbide mononitrate (IMDUR) 24 hr tablet 30 mg  30 mg Oral Daily Shanda Howells, MD      .  lisinopril (PRINIVIL,ZESTRIL) tablet 20 mg  20 mg Oral BID Shanda Howells, MD      . morphine 2 MG/ML injection 2 mg  2 mg Intravenous Q2H PRN Shanda Howells, MD      . mupirocin ointment (BACTROBAN) 2 % 1 application  1 application Nasal BID Shanda Howells, MD      . pantoprazole (PROTONIX) EC tablet 40 mg  40 mg Oral BID Shanda Howells, MD      . phenytoin (DILANTIN) ER capsule 200-300 mg  200-300 mg Oral BID Shanda Howells, MD      . potassium chloride (K-DUR) CR tablet 10 mEq  10 mEq Oral Daily Shanda Howells, MD      . simvastatin (ZOCOR) tablet 40 mg  40 mg Oral QPM Shanda Howells, MD      . sodium chloride 0.9 % injection 3 mL  3 mL Intravenous Q12H Shanda Howells, MD      . spironolactone (ALDACTONE) tablet 50 mg  50 mg Oral Daily Shanda Howells, MD       Current Outpatient Prescriptions  Medication Sig Dispense Refill  . amLODipine (NORVASC) 5 MG tablet Take 1 tablet (5 mg total) by mouth daily.  30 tablet  2  . cloNIDine (CATAPRES) 0.2 MG tablet Take 0.2 mg by mouth 2 (two) times daily.      [name] diltiazem (CARDIZEM CD) 120 MG 24 hr capsule Take 1 capsule (120 mg total) by mouth daily.  60 capsule  1  . divalproex (DEPAKOTE) 500 MG DR tablet Take 1 tablet (500 mg total) by mouth 2 (two) times daily.  60 tablet  1  . ferrous sulfate 325 (65 FE) MG tablet Take 1 tablet (325 mg total) by mouth 2 (two) times daily with a meal.  30 tablet  3  . furosemide (LASIX) 40 MG tablet Take 1 tablet (40 mg total) by mouth  daily.  30 tablet  3  . hydrALAZINE (APRESOLINE) 100 MG tablet Take 1 tablet (100 mg total) by mouth every 8 (eight) hours.  90 tablet  2  . isosorbide mononitrate (IMDUR) 30 MG 24 hr tablet Take 1 tablet (30 mg total) by mouth daily.  30 tablet  1  . lisinopril (PRINIVIL,ZESTRIL) 20 MG tablet Take 1 tablet (20 mg total) by mouth 2 (two) times daily.  30 tablet  1  . mupirocin ointment (BACTROBAN) 2 % Place 1 application into the nose 2 (two) times daily.  22 g  0  . pantoprazole (PROTONIX) 40 MG tablet Take 1 tablet (40 mg total) by mouth 2 (two) times daily.  60 tablet  0  . phenytoin (DILANTIN) 100 MG ER capsule Take 2-3 capsules (200-300 mg total) by mouth 2 (two) times daily. Take 2 capsules by mouth in the morning and 3 capsules by mouth at bedtime.  90 capsule  2  . potassium chloride (K-DUR) 10 MEQ tablet Take 1 tablet (10 mEq total) by mouth daily.  30 tablet  1  . simvastatin (ZOCOR) 40 MG tablet Take 1 tablet (40 mg total) by mouth every evening.  30 tablet  1  . spironolactone (ALDACTONE) 50 MG tablet Take 1 tablet (50 mg total) by mouth daily.  30 tablet  2   Review Of Systems: 12 point ROS negative except as noted above in HPI.  Physical Exam: Filed Vitals:   07/16/13 0000  BP: 171/90  Pulse: 87  Temp:   Resp:     General: alert, cooperative and mildly disshevled  HEENT:  PERRLA and extra ocular movement intact Heart: S1, S2 normal, no murmur, rub or gallop, regular rate and rhythm Lungs: clear to auscultation, no wheezes or rales and unlabored breathing Abdomen: abdomen is soft without significant tenderness, masses, organomegaly or guarding Extremities: 1-2+ peripheral pulses, 2-3+ pitting edema in LEs bilaterally  Skin:no rashes, no ecchymoses Neurology: normal without focal findings  Labs and Imaging: Lab Results  Component Value Date/Time   NA 141 07/15/2013  9:30 PM   K 4.3 07/15/2013  9:30 PM   CL 105 07/15/2013  9:30 PM   CO2 21 07/15/2013  9:30 PM   BUN 21  07/15/2013  9:30 PM   CREATININE 1.06 07/15/2013  9:30 PM   CREATININE 1.32 05/11/2010 11:00 PM   GLUCOSE 73 07/15/2013  9:30 PM   Lab Results  Component Value Date   WBC 5.3 07/15/2013   HGB 7.4* 07/15/2013   HCT 24.3* 07/15/2013   MCV 81.5 07/15/2013   PLT 306 07/15/2013   Dg Chest 2 View  07/15/2013   CLINICAL DATA:  Shortness of breath and lower extremity edema at  EXAM: CHEST  2 VIEW  COMPARISON:  DG CHEST 1V PORT dated 03/21/2013  FINDINGS: The cardiopericardial silhouette remains enlarged. The pulmonary vascularity is more engorged and less distinct today than on the previous study. The pulmonary interstitial markings also are mildly increased especially on the right. There is no significant pleural effusion. The patient has undergone previous CABG.  IMPRESSION: The findings are consistent with CHF with mild pulmonary interstitial edema.   Electronically Signed   By: David  Martinique   On: 07/15/2013 23:27     Assessment and Plan: Vashawn Ekstein is a 68 y.o. year old male presenting with bilateral leg pain    Leg pain; broad differential for symptoms including CHF exacerbation, claudication in the setting of peripheral vascular disease, DVT.  no signs of infection on exam. White blood cell count within normal limits. CT angiogram with runoff pending. Distal pulses intact on exam. Diuresis with IV Lasix. Noted elevated d-dimer at 1. Check lower extremity ultrasounds. Also check VQ scan. No pulmonary complaints currently. Anticoagulation fairly contraindicated given history of GI bleeds and hemoglobin 2 units below normal on presentation with questionable heme positive stools. Reassess in the morning. Start anticoagulation if respiratory status worsens.  Coronary artery disease/systolic CHF/HTN: Clinical CHF exacerbation. Diurese patient. Check repeat 2-D echo. Cycle cardiac enzymes. No active chest pain. Otherwise continue outpatient regimen.  Anemia: Hemoglobin is 7.4 today. 2 units below  baseline. Noted heme positive stools per patient at home.  Concern for active GI bleed. Check Hemoccult. Type and screen. Hold on anticoagulation. Continue to follow closely.   CKD: Creatinine above baseline today in the setting of volume overload. Avoid NSAIDs and ACEi in the setting of pending angiogram. Does need diuresis. Continue to follow.   Seizure disorder: No reported seizure activity. Continue home management.  Polysubstance abuse: Check UDS. Social work consult in house. FEN/GI: Low-sodium diet. PPI Prophylaxis: SCDs Disposition: pending further evaluation Code Status: Full Code       Shanda Howells MD  Pager: [phone]

## 2013-07-16 NOTE — Progress Notes (Signed)
CSW received multiple calls this afternoon that patient was wanting to see a Education officer, museum.  CSW went to his room and waited outside room while nurses were with him and patient was overheard asking to see a Camera operator. When CSW was able to enter the room- and provided introduction- patient stated "I've been asking for you to come in since this morning" and stated that he was very upset. Patient stated that he had an appointment with Ms. Frutoso Chase at [employer] today and wanted SW to call and let her know he was in the hospital.  CSW attempted to call [employer] office but they closed today at 12:00 and will re-open at 9 am tomorrow.  CSW relayed information to patient and assured him that a call would be placed to Ms. Ferrell in the morning after 9 am.  Patient stated that he was fine with this and appeared to visibly relax. A SW referral had been received today to talk to patient about current polysubstance use and homelessness.  CSW attempted to talk to patient - starting with issues of homelessness vs his report that he was staying with his nephew.  Patient became extremely upset and agitated- demanding to know where CSW received this information and wanted a copy of the "note" stating this information. "Who told you this- I want names and copies of the note right now! CSW attempted to reassure patient and that the role of the social worker was to clarify information and assist as needed but patient refused to talk any further with CSW.  Patient is adamantly requesting to speak to the "Director of the Hospital" and stated that he had nothing further to discuss with this CSW.  Above information was relayed to Karren Burly, Surveyor, quantity of 3E as unable to reach Therapist, sports.  She stated that she would follow up with patient as soon as possible.  CSW continued to try to talk to patient to obtain information re: his current living conditions and what support can be offered. Patient  stated that he had nothing else to say to this SW and that I needed to leave him alone.  "All I need from you is to call social security tomorrow- otherwise I don't have need anything from else from you."   CSW will follow up with patient in tomorrow after contacting social security.  Note:  Patient's Hx & Phy states that patient currently stays in a homeless shelter.  Lorie Phenix. [place], [place]

## 2013-07-16 NOTE — Progress Notes (Signed)
Pt lhad called via call bell asking want to see MD.  Ask pt what he like me to tell the MD when he is page.  Stated he will talk to him in the am regarding his social security.  Karie Kirks, Therapist, sports.

## 2013-07-16 NOTE — ED Notes (Signed)
Called Lab and added D-Dimer at 0110hrs

## 2013-07-16 NOTE — Progress Notes (Signed)
[facility] PROGRESS NOTE  Filed Weights   07/16/13 0215  Weight: 84.414 kg (186 lb 1.6 oz)        Intake/Output Summary (Last 24 hours) at 07/16/13 1135 Last data filed at 07/16/13 0916  Gross per 24 hour  Intake    600 ml  Output    625 ml  Net    -25 ml    Assessment/Plan: Acute systolic congestive heart failure, NYHA class 2 - Weight 84.4 Kg previous weight around 80 Kg. - Low sodium diet, fluid restrict apply TED hose. - Start lasix gtt, monitor electrolytes. - check BNP, dietician consult. - +JVD, anasarca, cont lasix , ACE-I  Add BiDil, d/c Norvasc andclonidine. - Start coreg. V/Q scan pending.   Cocaine abuse - counseling.  Chronic kidney disease (CKD), stage III (moderate) - At baseline.  Moderate to severe pulmonary hypertension: - previous echo showed PA pr 76 mmhg.  Anemia:  - Hemoglobin is 7.4 today. 2 units below baseline.  - Anemia panel - Check Hemoccult. Type and screen. Hold on anticoagulation.   CKD:  Creatinine above baseline today in the setting of volume overload.  - Avoid NSAIDs and ACEi in the setting of pending angiogram. Does need diuresis. Continue to follow.   Seizure disorder:  No reported seizure activity. Continue home management.    Code Status: full Family Communication: daughter and wife  Disposition Plan: inpatient   Consultants:  none  Procedures: ECHO: pending  Antibiotics:  None  HPI/Subjective: With PND  Objective: Filed Vitals:   07/16/13 0246 07/16/13 0330 07/16/13 0721 07/16/13 0945  BP: 178/98 150/80 166/89 130/70  Pulse:  100 84   Temp:   98.2 F (36.8 C)   TempSrc:   Oral   Resp:   20   Height:      Weight:      SpO2:   90%      Exam:  General: Alert, awake, oriented x3, in no acute distress.  HEENT: No bruits, no goiter. +JVD Heart: Regular rate and rhythm, anasarca. Lungs: Good air movement, crackles B/L Abdomen: Soft, nontender, nondistended, positive bowel sounds.      Data Reviewed: Basic Metabolic Panel:  Recent Labs Lab 07/15/13 2130 07/16/13 0344  NA 141 140  K 4.3 4.0  CL 105 102  CO2 21 23  GLUCOSE 73 102*  BUN 21 23  CREATININE 1.06 1.20  CALCIUM 8.5 8.6   Liver Function Tests:  Recent Labs Lab 07/15/13 2130 07/16/13 0344  AST 43* 46*  ALT 20 22  ALKPHOS 78 73  BILITOT 0.8 1.1  PROT 7.0 7.3  ALBUMIN 2.8* 3.1*   No results found for this basename: LIPASE, AMYLASE,  in the last 168 hours No results found for this basename: AMMONIA,  in the last 168 hours CBC:  Recent Labs Lab 07/15/13 2130 07/16/13 0344  WBC 5.3 5.8  NEUTROABS 3.8 4.3  HGB 7.4* 7.8*  HCT 24.3* 26.0*  MCV 81.5 84.1  PLT 306 345   Cardiac Enzymes:  Recent Labs Lab 07/16/13 0344 07/16/13 0653  TROPONINI <0.30 <0.30   BNP (last 3 results)  Recent Labs  03/08/13 1747 03/18/13 1930 07/15/13 2130  PROBNP 9784.0* 2997.0* 4293.0*   CBG: No results found for this basename: GLUCAP,  in the last 168 hours  Recent Results (from the past 240 hour(s))  MRSA PCR SCREENING     Status: Abnormal   Collection Time    07/16/13  2:37 AM  Result Value Ref Range Status   MRSA by PCR POSITIVE (*) NEGATIVE Final   Comment:            The GeneXpert MRSA Assay (FDA     approved for NASAL specimens     only), is one component of a     comprehensive MRSA colonization     surveillance program. It is not     intended to diagnose MRSA     infection nor to guide or     monitor treatment for     MRSA infections.     RESULT CALLED TO, READ BACK BY AND VERIFIED WITH:     M.ROSENBURGER RN [phone] 07/16/13 E.GADDY     Studies: Dg Chest 2 View  07/15/2013   CLINICAL DATA:  Shortness of breath and lower extremity edema at  EXAM: CHEST  2 VIEW  COMPARISON:  DG CHEST 1V PORT dated 03/21/2013  FINDINGS: The cardiopericardial silhouette remains enlarged. The pulmonary vascularity is more engorged and less distinct today than on the previous study. The pulmonary  interstitial markings also are mildly increased especially on the right. There is no significant pleural effusion. The patient has undergone previous CABG.  IMPRESSION: The findings are consistent with CHF with mild pulmonary interstitial edema.   Electronically Signed   By: David  Martinique   On: 07/15/2013 23:27   Ct Angio Ao+bifem W/cm &/or Wo/cm  07/16/2013   CLINICAL DATA:  Left leg pain and swelling.  EXAM: CT ANGIOGRAPHY OF ABDOMINAL AORTA WITH ILIOFEMORAL RUNOFF  TECHNIQUE: Multidetector CT imaging of the abdomen, pelvis and lower extremities was performed using the standard protocol during bolus administration of intravenous contrast. Multiplanar CT image reconstructions and MIPs were obtained to evaluate the vascular anatomy.  CONTRAST:  175mL OMNIPAQUE IOHEXOL 350 MG/ML SOLN  COMPARISON:  CT of the abdomen and pelvis from 03/24/2010, and abdominal ultrasound performed 04/18/2010  FINDINGS: Aorta: Relatively diffuse calcification is seen along the abdominal aorta and its branches. The celiac trunk, superior mesenteric artery, bilateral renal arteries and inferior mesenteric artery remain patent, with mild calcific atherosclerotic disease seen along the superior mesenteric artery and bilateral renal arteries. There is likely moderate luminal narrowing at the proximal right common iliac artery, and severe luminal narrowing at the proximal left common iliac artery.  Calcific atherosclerotic disease is somewhat worse at the left common iliac artery, with additional moderate to severe luminal narrowing along its course. There is also moderate to severe luminal narrowing at the bifurcation of the left common iliac artery. Flow is still seen to the level of the common femoral arteries bilaterally.  Right Lower Extremity: Scattered calcific atherosclerotic disease is noted along the visualized vasculature. There is likely mild to moderate luminal narrowing involving the proximal profunda femoris. The right  superficial femoral artery appears to occlude at the level of the mid thigh, though it reconstitutes more distally, just proximal to the level of the knee. This may reflect reconstitution of flow from collateral vessels, or trace nonvisualized flow along the apparently occluded segment.  Calcific atherosclerotic disease is noted along the distal popliteal artery. There is apparent three vessel runoff to the right ankle, though the posterior tibial artery is less well characterized just proximal to the level of the ankle.  Left Lower Extremity: Scattered calcific atherosclerotic disease is noted along the visualized vasculature. There is likely moderate luminal narrowing involving the proximal profunda femoris. There is apparent occlusion of the left superficial femoral artery at the level of the mid  thigh, as on the right, with reconstitution noted just proximal to the level of the knee. As on the right, this may reflect reconstitution of flow from collateral vessels, with trace nonvisualized flow along the apparently occluded segment.  There is mild scattered calcific atherosclerotic disease along the proximal branches of the left popliteal artery. Patent three vessel runoff is seen to the level of the ankle.  Additional Findings:  A trace left pleural effusion is noted.  Trace ascites is noted surrounding the liver, tracking along the right paracolic gutter.  The liver and spleen are grossly unremarkable in appearance. The gallbladder is within normal limits. The pancreas and adrenal glands are unremarkable.  Nonspecific perinephric stranding is noted bilaterally. The kidneys are otherwise unremarkable in appearance. There is no evidence of hydronephrosis. No renal or ureteral stones are seen.  No free fluid is identified. The small bowel is unremarkable in appearance. The stomach is within normal limits. No acute vascular abnormalities are seen.  The appendix is normal in caliber and contains air, without  evidence for appendicitis. The colon is unremarkable in appearance.  The bladder is mildly distended. Mild bladder wall thickening is nonspecific and may be chronic in nature. The prostate remains normal in size. No inguinal lymphadenopathy is seen.  Note is made of diffuse bilateral lower extremity edema, slightly worse on the left. This is relatively superficial in nature. No underlying focal fluid collection is seen. Edema resolves at the level of the ankles, though trace edema is noted at both feet.  No acute osseous abnormalities are identified. Multilevel vacuum phenomenon is noted at the lower lumbar spine.  Review of the MIP images confirms the above findings.  IMPRESSION: 1. Vascular findings as described above. 2. The superficial femoral arteries appear to exclude bilaterally at the level of the mid thighs, though they reconstitute more distally, just proximal to the level of the knees. This may reflect reconstitution of flow from collateral vessels, or trace nonvisualized flow along the apparently occluded segments. 3. Calcific atherosclerotic disease is somewhat worsened at the left common iliac artery, with moderate luminal narrowing at the proximal right common iliac artery, and severe luminal narrowing at multiple locations along the left common iliac artery. 4. Following reconstitution of the popliteal arteries, there appears to be patent three vessel runoff to both lower extremities, though the right posterior tibial artery is difficult to fully characterize just proximal to the level of the ankle. 5. Diffuse bilateral lower extremity edema, slightly worse on the left. This is relatively superficial in nature. No underlying focal fluid collection seen. Edema resolves at the level of the ankles, though trace edema is seen at both feet. 6. Trace ascites noted surrounding the liver, tracking along the right paracolic gutter. 7. Trace left pleural effusion noted. 8. Mild bladder wall thickening is  nonspecific and may be chronic in nature.   Electronically Signed   By: Garald Balding M.D.   On: 07/16/2013 02:46    Scheduled Meds: . amLODipine  5 mg Oral Daily  . Chlorhexidine Gluconate Cloth  6 each Topical Q0600  . cloNIDine  0.2 mg Oral BID  . diltiazem  120 mg Oral Daily  . divalproex  500 mg Oral BID  . ferrous sulfate  325 mg Oral BID WC  . hydrALAZINE  100 mg Oral 3 times per day  . isosorbide mononitrate  30 mg Oral Daily  . [START ON 07/17/2013] lisinopril  20 mg Oral BID  . mupirocin ointment  1 application  Nasal BID  . pantoprazole  40 mg Oral BID  . phenytoin  200 mg Oral Daily  . phenytoin  300 mg Oral QHS  . potassium chloride  10 mEq Oral Daily  . simvastatin  40 mg Oral QPM  . sodium chloride  3 mL Intravenous Q12H  . spironolactone  50 mg Oral Daily   Continuous Infusions: . furosemide (LASIX) infusion       Charlynne Cousins  [facility] Pager [phone] If 8PM-8AM, please contact night-coverage at [email or web address], password [email or web address] 07/16/2013, 11:35 AM  LOS: 1 day

## 2013-07-17 ENCOUNTER — Inpatient Hospital Stay (HOSPITAL_COMMUNITY): Payer: Medicare Other

## 2013-07-17 DIAGNOSIS — I5021 Acute systolic (congestive) heart failure: Secondary | ICD-10-CM

## 2013-07-17 DIAGNOSIS — I2510 Atherosclerotic heart disease of native coronary artery without angina pectoris: Secondary | ICD-10-CM

## 2013-07-17 DIAGNOSIS — F1410 Cocaine abuse, uncomplicated: Secondary | ICD-10-CM

## 2013-07-17 DIAGNOSIS — N183 Chronic kidney disease, stage 3 unspecified: Secondary | ICD-10-CM

## 2013-07-17 DIAGNOSIS — I5033 Acute on chronic diastolic (congestive) heart failure: Secondary | ICD-10-CM

## 2013-07-17 DIAGNOSIS — I2789 Other specified pulmonary heart diseases: Secondary | ICD-10-CM

## 2013-07-17 DIAGNOSIS — I5043 Acute on chronic combined systolic (congestive) and diastolic (congestive) heart failure: Principal | ICD-10-CM

## 2013-07-17 DIAGNOSIS — G40909 Epilepsy, unspecified, not intractable, without status epilepticus: Secondary | ICD-10-CM

## 2013-07-17 LAB — CBC WITH DIFFERENTIAL/PLATELET
Basophils Absolute: 0 10*3/uL (ref 0.0–0.1)
Basophils Relative: 1 % (ref 0–1)
Eosinophils Absolute: 0.2 10*3/uL (ref 0.0–0.7)
Eosinophils Relative: 4 % (ref 0–5)
HCT: 24.1 % — ABNORMAL LOW (ref 39.0–52.0)
HEMOGLOBIN: 7.2 g/dL — AB (ref 13.0–17.0)
LYMPHS ABS: 0.9 10*3/uL (ref 0.7–4.0)
Lymphocytes Relative: 17 % (ref 12–46)
MCH: 25.1 pg — AB (ref 26.0–34.0)
MCHC: 29.9 g/dL — AB (ref 30.0–36.0)
MCV: 84 fL (ref 78.0–100.0)
Monocytes Absolute: 0.6 10*3/uL (ref 0.1–1.0)
Monocytes Relative: 11 % (ref 3–12)
NEUTROS ABS: 3.6 10*3/uL (ref 1.7–7.7)
NEUTROS PCT: 67 % (ref 43–77)
Platelets: 309 10*3/uL (ref 150–400)
RBC: 2.87 MIL/uL — AB (ref 4.22–5.81)
RDW: 18.3 % — ABNORMAL HIGH (ref 11.5–15.5)
WBC: 5.3 10*3/uL (ref 4.0–10.5)

## 2013-07-17 LAB — COMPREHENSIVE METABOLIC PANEL
ALT: 19 U/L (ref 0–53)
AST: 41 U/L — ABNORMAL HIGH (ref 0–37)
Albumin: 2.5 g/dL — ABNORMAL LOW (ref 3.5–5.2)
Alkaline Phosphatase: 91 U/L (ref 39–117)
BUN: 24 mg/dL — AB (ref 6–23)
CO2: 24 meq/L (ref 19–32)
CREATININE: 1.49 mg/dL — AB (ref 0.50–1.35)
Calcium: 8.2 mg/dL — ABNORMAL LOW (ref 8.4–10.5)
Chloride: 103 mEq/L (ref 96–112)
GFR, EST AFRICAN AMERICAN: 53 mL/min — AB (ref >90)
GFR, EST NON AFRICAN AMERICAN: 46 mL/min — AB (ref >90)
GLUCOSE: 106 mg/dL — AB (ref 70–99)
Potassium: 4.3 mEq/L (ref 3.7–5.3)
Sodium: 141 mEq/L (ref 137–147)
TOTAL PROTEIN: 6.6 g/dL (ref 6.0–8.3)
Total Bilirubin: 0.5 mg/dL (ref 0.3–1.2)

## 2013-07-17 MED ORDER — TECHNETIUM TC 99M DIETHYLENETRIAME-PENTAACETIC ACID: 40.0000 | Freq: Once | INTRAVENOUS | Status: AC | PRN

## 2013-07-17 MED ORDER — CARVEDILOL 3.125 MG PO TABS
3.1250 mg | ORAL_TABLET | Freq: Two times a day (BID) | ORAL | Status: DC
  Administered 2013-07-17 – 2013-07-21 (×9): 3.125 mg via ORAL
  Filled 2013-07-17 (×11): qty 1

## 2013-07-17 MED ORDER — TECHNETIUM TO 99M ALBUMIN AGGREGATED
6.0000 | Freq: Once | INTRAVENOUS | Status: AC | PRN
  Administered 2013-07-17: 6 via INTRAVENOUS

## 2013-07-17 MED ORDER — METOLAZONE 5 MG PO TABS
5.0000 mg | ORAL_TABLET | Freq: Two times a day (BID) | ORAL | Status: DC
  Administered 2013-07-17 – 2013-07-18 (×3): 5 mg via ORAL
  Filled 2013-07-17 (×5): qty 1

## 2013-07-17 MED ORDER — FUROSEMIDE 10 MG/ML IJ SOLN
15.0000 mg/h | INTRAMUSCULAR | Status: DC
  Administered 2013-07-17 – 2013-07-18 (×2): 15 mg/h via INTRAVENOUS
  Filled 2013-07-17 (×5): qty 25

## 2013-07-17 MED ORDER — METOLAZONE 5 MG PO TABS: 5.0000 mg | ORAL_TABLET | Freq: Two times a day (BID) | ORAL | Status: DC

## 2013-07-17 MED ORDER — METOLAZONE 2.5 MG PO TABS
2.5000 mg | ORAL_TABLET | Freq: Every day | ORAL | Status: DC
  Filled 2013-07-17: qty 1

## 2013-07-17 NOTE — Consult Note (Signed)
Advanced Heart Failure Team Consult Note  Referring Physician: Dr. Olevia Bowens Primary Physician: Charles [name] Primary Cardiologist: Dr. Marylou Mccoy [facility])  Reason for Consultation: Heart Failure  HPI:    Charles [name] is a 68 y.o. year old male with prior hx/o cocaine abuse, GI bleed, systolic CHF, CAD s/p CABG 2009, and stage 3 CKD.   Presented to the ED 07/16/13 with progressive LE edema for 1-2 weeks. Pertinent labs on admission Pro BNP 4300, Hgb 7.4, Cr 1.06, d-dimer 1.05, positive cocaine and troponin < 0.30. He had CT angio showing atherosclerotic disease. Bilateral venous dopplers negative for DVT and VQ scan negative for PE. He was started on lasix gtt 8 mg/hr with minimal UOP and rise in creatinine so it was discontinued. Prior to being admitted reports that he could walk about 1/2 block with no SOB. Denies orthopnea or PND. He reports he can't remember the last time he had a catheterization.  ECHO EF 50-55% mild RV dysfunction grade 2 DD  CTA (4/29) LEs showed significant PAD and LE edema. Received 100cc contrast  SH: Lives with daughter in [place], drinks 2 40 oz cans beer a week, uses cocaine and smokes 2 cigs a day FH: No cardiac history  Review of Systems: [y] = yes, [ ]  = no   General: Weight gain [Y ]; Weight loss [ ] ; Anorexia [ ] ; Fatigue [ ] ; Fever [ ] ; Chills [ ] ; Weakness [ ]   Cardiac: Chest pain/pressure [ ] ; Resting SOB Aqua.Slicker ]; Exertional SOB [ Y]; Orthopnea [ N]; Pedal Edema [ Y]; Palpitations [ ] ; Syncope [ ] ; Presyncope [ ] ; Paroxysmal nocturnal dyspnea[ ]   Pulmonary: Cough [ ] ; Wheezing[ ] ; Hemoptysis[ ] ; Sputum [ ] ; Snoring [ ]   GI: Vomiting[ ] ; Dysphagia[ ] ; Melena[ ] ; Hematochezia [ ] ; Heartburn[ ] ; Abdominal pain [ ] ; Constipation [ ] ; Diarrhea [ ] ; BRBPR [ ]   GU: Hematuria[ ] ; Dysuria [ ] ; Nocturia[ ]   Vascular: Pain in legs with walking [Y ]; Pain in feet with lying flat [Y ]; Non-healing sores [ ] ; Stroke [ ] ; TIA [ ] ; Slurred speech [ ] ;  Neuro: Headaches[ ] ;  Vertigo[ ] ; Seizures[ ] ; Paresthesias[ ] ;Blurred vision [ ] ; Diplopia [ ] ; Vision changes [ ]   Ortho/Skin: Arthritis [ ] ; Joint pain [Y ]; Muscle pain [ ] ; Joint swelling [ ] ; Back Pain [ ] ; Rash [ ]   Psych: Depression[ ] ; Anxiety[ ]   Heme: Bleeding problems [ ] ; Clotting disorders [ ] ; Anemia [ ]   Endocrine: Diabetes [ ] ; Thyroid dysfunction[ ]   Home Medications Prior to Admission medications   Medication Sig Start Date End Date Taking? Authorizing Provider  amLODipine (NORVASC) 5 MG tablet Take 1 tablet (5 mg total) by mouth daily. 03/13/13  Yes Charles Feil, MD  cloNIDine (CATAPRES) 0.2 MG tablet Take 0.2 mg by mouth 2 (two) times daily.   Yes Historical Provider, MD  diltiazem (CARDIZEM CD) 120 MG 24 hr capsule Take 1 capsule (120 mg total) by mouth daily. 03/13/13  Yes Charles Feil, MD  divalproex (DEPAKOTE) 500 MG DR tablet Take 1 tablet (500 mg total) by mouth 2 (two) times daily. 03/13/13  Yes Charles Feil, MD  ferrous sulfate 325 (65 FE) MG tablet Take 1 tablet (325 mg total) by mouth 2 (two) times daily with a meal. 03/13/13  Yes Charles Feil, MD  furosemide (LASIX) 40 MG tablet Take 1 tablet (40 mg total) by mouth daily. 03/13/13  Yes Charles Feil, MD  hydrALAZINE (APRESOLINE) 100 MG tablet Take 1 tablet (100 mg total) by mouth every 8 (eight) hours. 03/13/13  Yes Charles Feil, MD  isosorbide mononitrate (IMDUR) 30 MG 24 hr tablet Take 1 tablet (30 mg total) by mouth daily. 03/13/13  Yes Charles Feil, MD  lisinopril (PRINIVIL,ZESTRIL) 20 MG tablet Take 1 tablet (20 mg total) by mouth 2 (two) times daily. 03/13/13  Yes Charles Feil, MD  mupirocin ointment (BACTROBAN) 2 % Place 1 application into the nose 2 (two) times daily. 03/13/13  Yes Charles Feil, MD  pantoprazole (PROTONIX) 40 MG tablet Take 1 tablet (40 mg total) by mouth 2 (two) times daily. 03/19/13  Yes Charles Monte, MD  phenytoin (DILANTIN) 100 MG ER capsule Take 2-3 capsules (200-300 mg  total) by mouth 2 (two) times daily. Take 2 capsules by mouth in the morning and 3 capsules by mouth at bedtime. 03/13/13  Yes Charles Feil, MD  potassium chloride (K-DUR) 10 MEQ tablet Take 1 tablet (10 mEq total) by mouth daily. 03/13/13  Yes Charles Feil, MD  simvastatin (ZOCOR) 40 MG tablet Take 1 tablet (40 mg total) by mouth every evening. 03/13/13  Yes Charles Feil, MD  spironolactone (ALDACTONE) 50 MG tablet Take 1 tablet (50 mg total) by mouth daily. 03/13/13  Yes Charles Feil, MD    Past Medical History: Past Medical History  Diagnosis Date  . Iron deficiency anemia   . Hypertension   . Chronic systolic heart failure     2D Echo (03/2010) - EF 35-40% with akinesis of inferoposterior myocardium  . CAD (coronary artery disease)     s/p 3-vessel CABG (12/2007) // 100% RCA stenosis with collaterals from left system. Severe bifurcation lesions of proxima CXA and OM. Moderate LAD diseaseFollowed by Dr[facility]  . Hyperlipidemia LDL goal <70   . Obstructive sleep apnea     not on home CPAP  . Ischemic cardiomyopathy   . Chronic kidney disease (CKD), stage III (moderate)     BL SCr 1.5-1.6  . Homelessness   . 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 // Colonoscopy and EGD in 03/2010 negative per report (record not found)  . 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 Polysubstance abuse     cocaine, THC  . AV malformation of gastrointestinal tract   . Family history of early CAD   . DDD (degenerative disc disease), lumbar   . Renal artery stenosis   . Peripheral vascular disease   . Seizure disorder   . Diabetes   . Acute on chronic systolic CHF (congestive heart failure) 08/13/2012  . Myocardial infarction   . Seizures     Past Surgical History: Past Surgical History  Procedure Laterality Date   . Coronary artery bypass graft  12/2007  . Apc  03/2010    To treat small bowel AVMs  . Esophagogastroduodenoscopy N/A 08/14/2012    Procedure: ESOPHAGOGASTRODUODENOSCOPY (EGD);  Surgeon: [name], MD;  Location[facility] ENDOSCOPY;  Service: Endoscopy;  Laterality: N/A;  . Hot hemostasis N/A 08/14/2012    Procedure: HOT HEMOSTASIS (ARGON PLASMA COAGULATION/BICAP);  Surgeon: [name], MD;  Location: [facility] ENDOSCOPY;  Service: Endoscopy;  Laterality: N/A;    Family History: Family History  Problem Relation Age of Onset  .  Heart disease Mother     unknown type  . Heart disease Father 20    died of MI at 74yo  . Heart disease Paternal Grandfather 17    died of MI  . Heart disease    . Heart disease Brother 20    Social History: History   Social History  . Marital Status: Single    Spouse Name: N/A    Number of Children: 2  . Years of Education: N/A   Occupational History  . Unemployed     previously worked in [employer]  .     Social History Main Topics  . Smoking status: Current Every Day Smoker -- 0.20 packs/day for 13 years    Types: Cigarettes    Last Attempt to Quit: 03/20/1997  . Smokeless tobacco: Never Used  . Alcohol Use: Yes     Comment: occasionally drinks, a few times a month  . Drug Use: 1.00 per week    Special: Cocaine, Marijuana  . Sexual Activity: No   Other Topics Concern  . None   Social History Narrative   Lives in [place]. Originally from [place], has been in the [place] since 1980s                   Allergies:  Allergies  Allergen Reactions  . Motrin [Ibuprofen] Other (See Comments)    Affects kidneys  . Tylenol [Acetaminophen] Other (See Comments)    Affects kidneys    Objective:    Vital Signs:   Temp:  [97.4 F (36.3 C)-98.5 F (36.9 C)] 97.4 F (36.3 C) (04/30 0511) Pulse Rate:  [56-70] 60 (04/30 1131) Resp:  [18-20] 18 (04/30 0511) BP: (109-165)/(57-74) 156/73 mmHg (04/30 1131) SpO2:  [90 %-96 %] 96 %  (04/30 1131) Weight:  [183 lb 3.2 oz (83.1 kg)] 183 lb 3.2 oz (83.1 kg) (04/30 0511) Last BM Date: 07/16/13  Weight change: Filed Weights   07/16/13 0215 07/17/13 0511  Weight: 186 lb 1.6 oz (84.414 kg) 183 lb 3.2 oz (83.1 kg)    Intake/Output:   Intake/Output Summary (Last 24 hours) at 07/17/13 1348 Last data filed at 07/17/13 1302  Gross per 24 hour  Intake    480 ml  Output   1850 ml  Net  -1370 ml     Physical Exam: General:  Chronically ill appearing. No resp difficulty; sitting up in chair HEENT: normal Neck: supple. JVP to earlobe . Carotids 2+ bilat; no bruits. No lymphadenopathy or thryomegaly appreciated. Cor: PMI nondisplaced. Regular rate & rhythm. No rubs, gallops or murmurs. +s3 Lungs: clear Abdomen: soft, nontender, nondistended. No hepatosplenomegaly. No bruits or masses. Good bowel sounds. Extremities: no cyanosis, clubbing, rash, bilateral LE woody edema, tender to light touch Neuro: alert & orientedx3, cranial nerves grossly intact. moves all 4 extremities w/o difficulty. Affect pleasant  Telemetry: SR 60s  Labs: Basic Metabolic Panel:  Recent Labs Lab 07/15/13 2130 07/16/13 0344 07/17/13 0642  NA 141 140 141  K 4.3 4.0 4.3  CL 105 102 103  CO2 21 23 24   GLUCOSE 73 102* 106*  BUN 21 23 24*  CREATININE 1.06 1.20 1.49*  CALCIUM 8.5 8.6 8.2*    Liver Function Tests:  Recent Labs Lab 07/15/13 2130 07/16/13 0344 07/17/13 0642  AST 43* 46* 41*  ALT 20 22 19   ALKPHOS 78 73 91  BILITOT 0.8 1.1 0.5  PROT 7.0 7.3 6.6  ALBUMIN 2.8* 3.1* 2.5*   No results found for  this basename: LIPASE, AMYLASE,  in the last 168 hours No results found for this basename: AMMONIA,  in the last 168 hours  CBC:  Recent Labs Lab 07/15/13 2130 07/16/13 0344 07/17/13 0642  WBC 5.3 5.8 5.3  NEUTROABS 3.8 4.3 3.6  HGB 7.4* 7.8* 7.2*  HCT 24.3* 26.0* 24.1*  MCV 81.5 84.1 84.0  PLT 306 345 309    Cardiac Enzymes:  Recent Labs Lab 07/16/13 0344  07/16/13 0653 07/16/13 1253  TROPONINI <0.30 <0.30 <0.30    BNP: BNP (last 3 results)  Recent Labs  03/08/13 1747 03/18/13 1930 07/15/13 2130  PROBNP 9784.0* 2997.0* 4293.0*    CBG: No results found for this basename: GLUCAP,  in the last 168 hours  Coagulation Studies:  Recent Labs  07/15/13 2223  LABPROT 15.3*  INR 1.24    Other results: EKG: NSR 76 bpm  Imaging: Dg Chest 2 View  07/15/2013   CLINICAL DATA:  Shortness of breath and lower extremity edema at  EXAM: CHEST  2 VIEW  COMPARISON:  DG CHEST 1V PORT dated 03/21/2013  FINDINGS: The cardiopericardial silhouette remains enlarged. The pulmonary vascularity is more engorged and less distinct today than on the previous study. The pulmonary interstitial markings also are mildly increased especially on the right. There is no significant pleural effusion. The patient has undergone previous CABG.  IMPRESSION: The findings are consistent with CHF with mild pulmonary interstitial edema.   Electronically Signed   By: David  [place]   On: 07/15/2013 23:27   Ct Angio Ao+bifem W/cm &/or Wo/cm  07/16/2013   CLINICAL DATA:  Left leg pain and swelling.  EXAM: CT ANGIOGRAPHY OF ABDOMINAL AORTA WITH ILIOFEMORAL RUNOFF  TECHNIQUE: Multidetector CT imaging of the abdomen, pelvis and lower extremities was performed using the standard protocol during bolus administration of intravenous contrast. Multiplanar CT image reconstructions and MIPs were obtained to evaluate the vascular anatomy.  CONTRAST:  OMNIPAQUE IOHEXOL 350 MG/ML SOLN  COMPARISON:  CT of the abdomen and pelvis from 03/24/2010, and abdominal ultrasound performed 04/18/2010  FINDINGS: Aorta: Relatively diffuse calcification is seen along the abdominal aorta and its branches. The celiac trunk, superior mesenteric artery, bilateral renal arteries and inferior mesenteric artery remain patent, with mild calcific atherosclerotic disease seen along the superior mesenteric artery and  bilateral renal arteries. There is likely moderate luminal narrowing at the proximal right common iliac artery, and severe luminal narrowing at the proximal left common iliac artery.  Calcific atherosclerotic disease is somewhat worse at the left common iliac artery, with additional moderate to severe luminal narrowing along its course. There is also moderate to severe luminal narrowing at the bifurcation of the left common iliac artery. Flow is still seen to the level of the common femoral arteries bilaterally.  Right Lower Extremity: Scattered calcific atherosclerotic disease is noted along the visualized vasculature. There is likely mild to moderate luminal narrowing involving the proximal profunda femoris. The right superficial femoral artery appears to occlude at the level of the mid thigh, though it reconstitutes more distally, just proximal to the level of the knee. This may reflect reconstitution of flow from collateral vessels, or trace nonvisualized flow along the apparently occluded segment.  Calcific atherosclerotic disease is noted along the distal popliteal artery. There is apparent three vessel runoff to the right ankle, though the posterior tibial artery is less well characterized just proximal to the level of the ankle.  Left Lower Extremity: Scattered calcific atherosclerotic disease is noted along the visualized vasculature.  There is likely moderate luminal narrowing involving the proximal profunda femoris. There is apparent occlusion of the left superficial femoral artery at the level of the mid thigh, as on the right, with reconstitution noted just proximal to the level of the knee. As on the right, this may reflect reconstitution of flow from collateral vessels, with trace nonvisualized flow along the apparently occluded segment.  There is mild scattered calcific atherosclerotic disease along the proximal branches of the left popliteal artery. Patent three vessel runoff is seen to the level of  the ankle.  Additional Findings:  A trace left pleural effusion is noted.  Trace ascites is noted surrounding the liver, tracking along the right paracolic gutter.  The liver and spleen are grossly unremarkable in appearance. The gallbladder is within normal limits. The pancreas and adrenal glands are unremarkable.  Nonspecific perinephric stranding is noted bilaterally. The kidneys are otherwise unremarkable in appearance. There is no evidence of hydronephrosis. No renal or ureteral stones are seen.  No free fluid is identified. The small bowel is unremarkable in appearance. The stomach is within normal limits. No acute vascular abnormalities are seen.  The appendix is normal in caliber and contains air, without evidence for appendicitis. The colon is unremarkable in appearance.  The bladder is mildly distended. Mild bladder wall thickening is nonspecific and may be chronic in nature. The prostate remains normal in size. No inguinal lymphadenopathy is seen.  Note is made of diffuse bilateral lower extremity edema, slightly worse on the left. This is relatively superficial in nature. No underlying focal fluid collection is seen. Edema resolves at the level of the ankles, though trace edema is noted at both feet.  No acute osseous abnormalities are identified. Multilevel vacuum phenomenon is noted at the lower lumbar spine.  Review of the MIP images confirms the above findings.  IMPRESSION: 1. Vascular findings as described above. 2. The superficial femoral arteries appear to exclude bilaterally at the level of the mid thighs, though they reconstitute more distally, just proximal to the level of the knees. This may reflect reconstitution of flow from collateral vessels, or trace nonvisualized flow along the apparently occluded segments. 3. Calcific atherosclerotic disease is somewhat worsened at the left common iliac artery, with moderate luminal narrowing at the proximal right common iliac artery, and severe luminal  narrowing at multiple locations along the left common iliac artery. 4. Following reconstitution of the popliteal arteries, there appears to be patent three vessel runoff to both lower extremities, though the right posterior tibial artery is difficult to fully characterize just proximal to the level of the ankle. 5. Diffuse bilateral lower extremity edema, slightly worse on the left. This is relatively superficial in nature. No underlying focal fluid collection seen. Edema resolves at the level of the ankles, though trace edema is seen at both feet. 6. Trace ascites noted surrounding the liver, tracking along the right paracolic gutter. 7. Trace left pleural effusion noted. 8. Mild bladder wall thickening is nonspecific and may be chronic in nature.   Electronically Signed   By: Garald Balding M.D.   On: 07/16/2013 02:46   Nm Pulmonary Perf And Vent  07/17/2013   CLINICAL DATA:  Right leg pain and swelling. Question pulmonary embolus.  EXAM: NUCLEAR MEDICINE VENTILATION - PERFUSION LUNG SCAN  TECHNIQUE: Ventilation images were obtained in multiple projections using inhaled aerosol technetium 99 M DTPA. Perfusion images were obtained in multiple projections after intravenous injection of Tc-91m MAA.  RADIOPHARMACEUTICALS:  40 mCi Tc-67m DTPA  aerosol and 6 mCi Tc-55m MAA  COMPARISON:  PA and lateral chest 07/15/2013.  CT scan 04/08/2010.  FINDINGS: Ventilation: No focal ventilation defect.  Perfusion: No wedge shaped peripheral perfusion defects to suggest acute pulmonary embolism.  IMPRESSION: Negative exam.   Electronically Signed   By: Inge Rise M.D.   On: 07/17/2013 12:32      Medications:     Current Medications: . carvedilol  3.125 mg Oral BID WC  . Chlorhexidine Gluconate Cloth  6 each Topical Q0600  . divalproex  500 mg Oral BID  . ferrous sulfate  325 mg Oral BID WC  . isosorbide-hydrALAZINE  1 tablet Oral BID  . lisinopril  20 mg Oral BID  . mupirocin ointment  1 application Nasal BID   . pantoprazole  40 mg Oral BID  . phenytoin  200 mg Oral Daily  . phenytoin  300 mg Oral QHS  . potassium chloride  10 mEq Oral Daily  . simvastatin  40 mg Oral QPM  . sodium chloride  3 mL Intravenous Q12H  . spironolactone  50 mg Oral Daily     Infusions:      Assessment:   1) A/C diastolic heart failure 2) HTN 3) Polysubstance abuse 4) CAD - s/p CABG 5) chronic anemia 6) Acute renal failure stage III  Plan/Discussion:    Mr. Fillinger is a 68 yo male with a history of HTN, diastolic HF, HTN, CAD s/p CABG 2009 and chronic anemia. He presented to the hospital after progressive LE edema and bilateral leg pain. Venous dopplers were negative for DVT and CT scan was non-significant. VQ scan was negative for PE. EF 50-55% with mild RV dysfunction  He does appear to be markedly volume overloaded with bilateral LE woody edema and elevated JVD to earlobes. He has not had much UOP with lasix gtt and creatinine rising, likely related to recent IV dye. Will restart lasix 15 mg/hr with metolazone 5 mg BID. If UOP sluggish overnight will consider RHC tomorrow.   Blood pressure remains in the 150s with multiple anti-hypertensives.  Length of Stay: 2  Charles Brunt NP-C 07/17/2013, 1:48 PM  Advanced Heart Failure Team Pager [phone] (M-F; 7a - 4p)  Please contact [facility] for night-coverage after hours (4p -7a ) and weekends on [email or web address]  Patient seen and examined with Charles Bame, NP. We discussed all aspects of the encounter. I agree with the assessment and plan as stated above.   He is markedly fluid overload. Lasix stopped due to small bump in creatinine however this may be related to contrast exposure. Would resume IV lasix at 15/hr and add metolazone 5mg  bid. If renal function getting worse can consider RHC to assess hemodynamics.   Pheo unlikely. Suspect intrinsic HTN. Will address once diuresis complete. AB CT showed no RAS. W/u of anemia per primary team. Should  probably get IV iron.  Charles Pascal Savio Albrecht,MD 4:33 PM

## 2013-07-17 NOTE — Progress Notes (Signed)
Nutrition Education Note  RD consulted for nutrition education regarding new onset CHF.  Pt resistant to education at first stating that he eats healthy and "the foods I eat is not what caused my leg swelling, it's my heart failure". Discussed with pt the link between sodium in his diet and his heart failure and swelling.  RD provided "Low Sodium Nutrition Therapy" handout from the Academy of [employer] (AND). Reviewed patient's dietary recall. Pt reports eating waffles, pancakes, hot dogs, beef ribs, and pizza PTA.  Provided examples on ways to decrease sodium intake in diet. Discouraged intake of processed foods and use of salt shaker. Encouraged fresh fruits and vegetables as well as whole grain sources of carbohydrates to maximize fiber intake.  Provided pt with "Sodium Content of Foods List" from AND and discussed the importance of avoiding high sodium foods. Encouraged pt to share handouts with his daughter who helps cook for him.   RD discussed why it is important for patient to adhere to diet recommendations, and emphasized the role of fluids, foods to avoid, and importance of weighing self daily. Teach back method used.  Expect fair compliance.  Body mass index is 30.49 kg/(m^2). Pt meets criteria for Obesity based on current BMI.  Current diet order is 2 Gram Sodium diet, patient is consuming approximately 100% of meals at this time. Labs and medications reviewed. No further nutrition interventions warranted at this time. RD contact information provided. If additional nutrition issues arise, please re-consult RD.   Pryor Ochoa RD, LDN Inpatient Clinical Dietitian Pager: [phone] After Hours Pager: [phone]

## 2013-07-17 NOTE — Progress Notes (Signed)
[facility] PROGRESS NOTE  Filed Weights   07/16/13 0215 07/17/13 0511  Weight: 84.414 kg (186 lb 1.6 oz) 83.1 kg (183 lb 3.2 oz)        Intake/Output Summary (Last 24 hours) at 07/17/13 1042 Last data filed at 07/17/13 0900  Gross per 24 hour  Intake    480 ml  Output   1500 ml  Net  -1020 ml    Assessment/Plan: Acute systolic congestive heart failure, NYHA class 2 - Weight 84.4 Kg previous weight around 80 Kg. - Low sodium diet, fluid restrict apply TED hose. - Start lasix gtt Cr increase significantly hold lasix., monitor electrolytes. - check BNP, dietician consult. - +JVD, anasarca, cont lasix , ACE-I  Add BiDil, d/c Norvasc andclonidine. - Start coreg. V/Q scan negative for PE - echo 4.29.2015: estimated ejection fraction was in the range of 50% to 55%. Possible mild hypokinesis of the basal-midinferolateral and inferior myocardium. Features are consistent with a pseudonormal left ventricular filling pattern, with concomitant abnormal relaxation and increased filling pressure (grade 2 diastolic dysfunction). Pulmonary arteries: Systolic pressure was moderately increased. PA peak pressure: 73mm Hg. - Consult heart failure team.  Cocaine abuse - counseling.  Chronic kidney disease (CKD), stage III (moderate) - At baseline.  Moderate to severe pulmonary hypertension: - previous echo showed PA pr 76 mmhg. Repeated showed > 50 mmhg  Anemia:  - Hemoglobin is 7.4 today. 2 units below baseline.  - Anemia panel - Check Hemoccult. Type and screen. Hold on anticoagulation.   CKD:  Creatinine above baseline today in the setting of volume overload.  - Avoid NSAIDs and ACEi in the setting of pending angiogram. Does need diuresis. Continue to follow.   Seizure disorder:  No reported seizure activity. Continue home management.    Code Status: full Family Communication: daughter and wife  Disposition Plan: inpatient   Consultants:  none  Procedures: ECHO:  pending  Antibiotics:  None  HPI/Subjective: With PND  Objective: Filed Vitals:   07/16/13 1439 07/16/13 1839 07/16/13 2029 07/17/13 0511  BP: 119/63 117/60 165/74 109/57  Pulse: 70 62 63 56  Temp: 98.4 F (36.9 C)  98.5 F (36.9 C) 97.4 F (36.3 C)  TempSrc: Oral  Oral Oral  Resp: 20  18 18   Height:      Weight:    83.1 kg (183 lb 3.2 oz)  SpO2: 95%  90% 92%     Exam:  General: Alert, awake, oriented x3, in no acute distress.  HEENT: No bruits, no goiter. +JVD Heart: Regular rate and rhythm, anasarca. Lower extremity pain B/L Lungs: Good air movement, crackles B/L Abdomen: Soft, nontender, nondistended, positive bowel sounds.     Data Reviewed: Basic Metabolic Panel:  Recent Labs Lab 07/15/13 2130 07/16/13 0344 07/17/13 0642  NA 141 140 141  K 4.3 4.0 4.3  CL 105 102 103  CO2 21 23 24   GLUCOSE 73 102* 106*  BUN 21 23 24*  CREATININE 1.06 1.20 1.49*  CALCIUM 8.5 8.6 8.2*   Liver Function Tests:  Recent Labs Lab 07/15/13 2130 07/16/13 0344 07/17/13 0642  AST 43* 46* 41*  ALT 20 22 19   ALKPHOS 78 73 91  BILITOT 0.8 1.1 0.5  PROT 7.0 7.3 6.6  ALBUMIN 2.8* 3.1* 2.5*   No results found for this basename: LIPASE, AMYLASE,  in the last 168 hours No results found for this basename: AMMONIA,  in the last 168 hours CBC:  Recent Labs Lab 07/15/13 2130 07/16/13  0344 07/17/13 0642  WBC 5.3 5.8 5.3  NEUTROABS 3.8 4.3 3.6  HGB 7.4* 7.8* 7.2*  HCT 24.3* 26.0* 24.1*  MCV 81.5 84.1 84.0  PLT 306 345 309   Cardiac Enzymes:  Recent Labs Lab 07/16/13 0344 07/16/13 0653 07/16/13 1253  TROPONINI <0.30 <0.30 <0.30   BNP (last 3 results)  Recent Labs  03/08/13 1747 03/18/13 1930 07/15/13 2130  PROBNP 9784.0* 2997.0* 4293.0*   CBG: No results found for this basename: GLUCAP,  in the last 168 hours  Recent Results (from the past 240 hour(s))  MRSA PCR SCREENING     Status: Abnormal   Collection Time    07/16/13  2:37 AM      Result  Value Ref Range Status   MRSA by PCR POSITIVE (*) NEGATIVE Final   Comment:            The GeneXpert MRSA Assay (FDA     approved for NASAL specimens     only), is one component of a     comprehensive MRSA colonization     surveillance program. It is not     intended to diagnose MRSA     infection nor to guide or     monitor treatment for     MRSA infections.     RESULT CALLED TO, READ BACK BY AND VERIFIED WITH:     M.ROSENBURGER RN [phone] 07/16/13 E.GADDY     Studies: Dg Chest 2 View  07/15/2013   CLINICAL DATA:  Shortness of breath and lower extremity edema at  EXAM: CHEST  2 VIEW  COMPARISON:  DG CHEST 1V PORT dated 03/21/2013  FINDINGS: The cardiopericardial silhouette remains enlarged. The pulmonary vascularity is more engorged and less distinct today than on the previous study. The pulmonary interstitial markings also are mildly increased especially on the right. There is no significant pleural effusion. The patient has undergone previous CABG.  IMPRESSION: The findings are consistent with CHF with mild pulmonary interstitial edema.   Electronically Signed   By: David  Martinique   On: 07/15/2013 23:27   Ct Angio Ao+bifem W/cm &/or Wo/cm  07/16/2013   CLINICAL DATA:  Left leg pain and swelling.  EXAM: CT ANGIOGRAPHY OF ABDOMINAL AORTA WITH ILIOFEMORAL RUNOFF  TECHNIQUE: Multidetector CT imaging of the abdomen, pelvis and lower extremities was performed using the standard protocol during bolus administration of intravenous contrast. Multiplanar CT image reconstructions and MIPs were obtained to evaluate the vascular anatomy.  CONTRAST:  120mL OMNIPAQUE IOHEXOL 350 MG/ML SOLN  COMPARISON:  CT of the abdomen and pelvis from 03/24/2010, and abdominal ultrasound performed 04/18/2010  FINDINGS: Aorta: Relatively diffuse calcification is seen along the abdominal aorta and its branches. The celiac trunk, superior mesenteric artery, bilateral renal arteries and inferior mesenteric artery remain patent, with  mild calcific atherosclerotic disease seen along the superior mesenteric artery and bilateral renal arteries. There is likely moderate luminal narrowing at the proximal right common iliac artery, and severe luminal narrowing at the proximal left common iliac artery.  Calcific atherosclerotic disease is somewhat worse at the left common iliac artery, with additional moderate to severe luminal narrowing along its course. There is also moderate to severe luminal narrowing at the bifurcation of the left common iliac artery. Flow is still seen to the level of the common femoral arteries bilaterally.  Right Lower Extremity: Scattered calcific atherosclerotic disease is noted along the visualized vasculature. There is likely mild to moderate luminal narrowing involving the proximal profunda femoris. The right  superficial femoral artery appears to occlude at the level of the mid thigh, though it reconstitutes more distally, just proximal to the level of the knee. This may reflect reconstitution of flow from collateral vessels, or trace nonvisualized flow along the apparently occluded segment.  Calcific atherosclerotic disease is noted along the distal popliteal artery. There is apparent three vessel runoff to the right ankle, though the posterior tibial artery is less well characterized just proximal to the level of the ankle.  Left Lower Extremity: Scattered calcific atherosclerotic disease is noted along the visualized vasculature. There is likely moderate luminal narrowing involving the proximal profunda femoris. There is apparent occlusion of the left superficial femoral artery at the level of the mid thigh, as on the right, with reconstitution noted just proximal to the level of the knee. As on the right, this may reflect reconstitution of flow from collateral vessels, with trace nonvisualized flow along the apparently occluded segment.  There is mild scattered calcific atherosclerotic disease along the proximal  branches of the left popliteal artery. Patent three vessel runoff is seen to the level of the ankle.  Additional Findings:  A trace left pleural effusion is noted.  Trace ascites is noted surrounding the liver, tracking along the right paracolic gutter.  The liver and spleen are grossly unremarkable in appearance. The gallbladder is within normal limits. The pancreas and adrenal glands are unremarkable.  Nonspecific perinephric stranding is noted bilaterally. The kidneys are otherwise unremarkable in appearance. There is no evidence of hydronephrosis. No renal or ureteral stones are seen.  No free fluid is identified. The small bowel is unremarkable in appearance. The stomach is within normal limits. No acute vascular abnormalities are seen.  The appendix is normal in caliber and contains air, without evidence for appendicitis. The colon is unremarkable in appearance.  The bladder is mildly distended. Mild bladder wall thickening is nonspecific and may be chronic in nature. The prostate remains normal in size. No inguinal lymphadenopathy is seen.  Note is made of diffuse bilateral lower extremity edema, slightly worse on the left. This is relatively superficial in nature. No underlying focal fluid collection is seen. Edema resolves at the level of the ankles, though trace edema is noted at both feet.  No acute osseous abnormalities are identified. Multilevel vacuum phenomenon is noted at the lower lumbar spine.  Review of the MIP images confirms the above findings.  IMPRESSION: 1. Vascular findings as described above. 2. The superficial femoral arteries appear to exclude bilaterally at the level of the mid thighs, though they reconstitute more distally, just proximal to the level of the knees. This may reflect reconstitution of flow from collateral vessels, or trace nonvisualized flow along the apparently occluded segments. 3. Calcific atherosclerotic disease is somewhat worsened at the left common iliac artery,  with moderate luminal narrowing at the proximal right common iliac artery, and severe luminal narrowing at multiple locations along the left common iliac artery. 4. Following reconstitution of the popliteal arteries, there appears to be patent three vessel runoff to both lower extremities, though the right posterior tibial artery is difficult to fully characterize just proximal to the level of the ankle. 5. Diffuse bilateral lower extremity edema, slightly worse on the left. This is relatively superficial in nature. No underlying focal fluid collection seen. Edema resolves at the level of the ankles, though trace edema is seen at both feet. 6. Trace ascites noted surrounding the liver, tracking along the right paracolic gutter. 7. Trace left pleural effusion noted.  8. Mild bladder wall thickening is nonspecific and may be chronic in nature.   Electronically Signed   By: Garald Balding M.D.   On: 07/16/2013 02:46    Scheduled Meds: . carvedilol  3.125 mg Oral BID WC  . Chlorhexidine Gluconate Cloth  6 each Topical Q0600  . cloNIDine  0.2 mg Oral BID  . divalproex  500 mg Oral BID  . ferrous sulfate  325 mg Oral BID WC  . isosorbide-hydrALAZINE  1 tablet Oral BID  . lisinopril  20 mg Oral BID  . mupirocin ointment  1 application Nasal BID  . pantoprazole  40 mg Oral BID  . phenytoin  200 mg Oral Daily  . phenytoin  300 mg Oral QHS  . potassium chloride  10 mEq Oral Daily  . simvastatin  40 mg Oral QPM  . sodium chloride  3 mL Intravenous Q12H  . spironolactone  50 mg Oral Daily   Continuous Infusions: . furosemide (LASIX) infusion 8 mg/hr (07/16/13 1231)     [facility] Pager [phone] If 8PM-8AM, please contact night-coverage at [email or web address], password [facility] 07/17/2013, 10:42 AM  LOS: 2 days

## 2013-07-18 DIAGNOSIS — N183 Chronic kidney disease, stage 3 unspecified: Secondary | ICD-10-CM

## 2013-07-18 LAB — CBC WITH DIFFERENTIAL/PLATELET
Basophils Absolute: 0.1 10*3/uL (ref 0.0–0.1)
Basophils Relative: 1 % (ref 0–1)
Eosinophils Absolute: 0.3 10*3/uL (ref 0.0–0.7)
Eosinophils Relative: 5 % (ref 0–5)
HCT: 28.1 % — ABNORMAL LOW (ref 39.0–52.0)
Hemoglobin: 8.3 g/dL — ABNORMAL LOW (ref 13.0–17.0)
Lymphocytes Relative: 12 % (ref 12–46)
Lymphs Abs: 0.7 10*3/uL (ref 0.7–4.0)
MCH: 24.9 pg — ABNORMAL LOW (ref 26.0–34.0)
MCHC: 29.5 g/dL — ABNORMAL LOW (ref 30.0–36.0)
MCV: 84.4 fL (ref 78.0–100.0)
Monocytes Absolute: 0.8 10*3/uL (ref 0.1–1.0)
Monocytes Relative: 13 % — ABNORMAL HIGH (ref 3–12)
NEUTROS PCT: 69 % (ref 43–77)
Neutro Abs: 4.3 10*3/uL (ref 1.7–7.7)
PLATELETS: 361 10*3/uL (ref 150–400)
RBC: 3.33 MIL/uL — AB (ref 4.22–5.81)
RDW: 18 % — AB (ref 11.5–15.5)
WBC: 6.2 10*3/uL (ref 4.0–10.5)

## 2013-07-18 LAB — COMPREHENSIVE METABOLIC PANEL
ALK PHOS: 127 U/L — AB (ref 39–117)
ALT: 20 U/L (ref 0–53)
AST: 39 U/L — ABNORMAL HIGH (ref 0–37)
Albumin: 3 g/dL — ABNORMAL LOW (ref 3.5–5.2)
BUN: 21 mg/dL (ref 6–23)
CHLORIDE: 95 meq/L — AB (ref 96–112)
CO2: 29 mEq/L (ref 19–32)
CREATININE: 1.29 mg/dL (ref 0.50–1.35)
Calcium: 9.1 mg/dL (ref 8.4–10.5)
GFR, EST AFRICAN AMERICAN: 64 mL/min — AB (ref >90)
GFR, EST NON AFRICAN AMERICAN: 55 mL/min — AB (ref >90)
GLUCOSE: 84 mg/dL (ref 70–99)
POTASSIUM: 4.2 meq/L (ref 3.7–5.3)
Sodium: 139 mEq/L (ref 137–147)
Total Bilirubin: 0.6 mg/dL (ref 0.3–1.2)
Total Protein: 7.7 g/dL (ref 6.0–8.3)

## 2013-07-18 LAB — CORTISOL: CORTISOL PLASMA: 16.5 ug/dL

## 2013-07-18 MED ORDER — AMLODIPINE BESYLATE 10 MG PO TABS
10.0000 mg | ORAL_TABLET | Freq: Every day | ORAL | Status: DC
  Administered 2013-07-18 – 2013-07-19 (×2): 10 mg via ORAL
  Filled 2013-07-18 (×2): qty 1

## 2013-07-18 MED ORDER — OXYCODONE HCL 5 MG PO TABS
5.0000 mg | ORAL_TABLET | Freq: Four times a day (QID) | ORAL | Status: AC | PRN
  Administered 2013-07-18 – 2013-07-20 (×4): 5 mg via ORAL
  Filled 2013-07-18 (×4): qty 1

## 2013-07-18 MED ORDER — ISOSORB DINITRATE-HYDRALAZINE 20-37.5 MG PO TABS
1.0000 | ORAL_TABLET | Freq: Three times a day (TID) | ORAL | Status: DC
  Administered 2013-07-18 – 2013-07-21 (×9): 1 via ORAL
  Filled 2013-07-18 (×12): qty 1

## 2013-07-18 MED ORDER — HEPARIN SODIUM (PORCINE) 5000 UNIT/ML IJ SOLN
5000.0000 [IU] | Freq: Three times a day (TID) | INTRAMUSCULAR | Status: DC
  Administered 2013-07-18 – 2013-07-20 (×3): 5000 [IU] via SUBCUTANEOUS
  Filled 2013-07-18 (×10): qty 1

## 2013-07-18 NOTE — Progress Notes (Signed)
[facility] PROGRESS NOTE  Filed Weights   07/17/13 0511 07/18/13 0541 07/18/13 0731  Weight: 83.1 kg (183 lb 3.2 oz) 79.969 kg (176 lb 4.8 oz) 79.5 kg (175 lb 4.3 oz)        Intake/Output Summary (Last 24 hours) at 07/18/13 0951 Last data filed at 07/18/13 0945  Gross per 24 hour  Intake    723 ml  Output   8250 ml  Net  -7527 ml    Assessment/Plan: Acute systolic congestive heart failure, NYHA class 2: - Previous weight around 80 Kg. Appreciate HF assistance. - Low sodium diet, Fluid restrict apply TED hose. - Restart lasix gtt and metolazone held for a short time, Cr. Improved. - Dietician consult. - +JVD, anasarca, cont lasix ,  Add BiDil. - V/Q scan negative for PE - Echo 4.29.2015 as below.  Cocaine abuse: - counseling.  Chronic kidney disease (CKD), stage III (moderate) - At baseline.  Moderate to severe pulmonary hypertension: - previous echo showed PA pr 76 mmhg. Repeated showed > 50 mmhg  Anemia:  - Hemoglobin is 7.4 today. - Anemia ferritin 24 cont iron pills. Check RBC folate. - Check Hemoccult. Type and screen.  - start DVT prophylaxis  Transaminitis: - split 2:1.  CKD: - Avoid NSAIDs and ACEi.  Seizure disorder:  No reported seizure activity. Continue home management.    Code Status: full Family Communication: daughter and wife  Disposition Plan: inpatient   Consultants:  none  Procedures: ECHO: estimated ejection fraction was in the range of 50% to 55%. Possible mild hypokinesis of the basal-midinferolateral and inferior myocardium. Features are consistent with a pseudonormal left ventricular filling pattern, with concomitant abnormal relaxation and increased filling pressure (grade 2 diastolic dysfunction). Pulmonary arteries: Systolic pressure was moderately increased. PA peak pressure: 64mm Hg.  Antibiotics:  None  HPI/Subjective: Able to lay flat. Relates hi lower extremity pain is improved.  Objective: Filed Vitals:    07/17/13 2122 07/18/13 0541 07/18/13 0731 07/18/13 0809  BP: 170/86 159/85 162/74 180/78  Pulse: 74 76 77 71  Temp: 98 F (36.7 C) 98 F (36.7 C) 97.6 F (36.4 C)   TempSrc: Oral Oral Oral   Resp: 18 18 22    Height:      Weight:  79.969 kg (176 lb 4.8 oz) 79.5 kg (175 lb 4.3 oz)   SpO2: 96% 96% 95%      Exam:  General: Alert, awake, oriented x3, in no acute distress.  HEENT: No bruits, no goiter. +JVD Heart: Regular rate and rhythm, anasarca. Lower extremity pain B/L Lungs: Good air movement, crackles B/L Abdomen: Soft, nontender, nondistended, positive bowel sounds.     Data Reviewed: Basic Metabolic Panel:  Recent Labs Lab 07/15/13 2130 07/16/13 0344 07/17/13 0642 07/18/13 0618  NA 141 140 141 139  K 4.3 4.0 4.3 4.2  CL 105 102 103 95*  CO2 21 23 24 29   GLUCOSE 73 102* 106* 84  BUN 21 23 24* 21  CREATININE 1.06 1.20 1.49* 1.29  CALCIUM 8.5 8.6 8.2* 9.1   Liver Function Tests:  Recent Labs Lab 07/15/13 2130 07/16/13 0344 07/17/13 0642 07/18/13 0618  AST 43* 46* 41* 39*  ALT 20 22 19 20   ALKPHOS 78 73 91 127*  BILITOT 0.8 1.1 0.5 0.6  PROT 7.0 7.3 6.6 7.7  ALBUMIN 2.8* 3.1* 2.5* 3.0*   No results found for this basename: LIPASE, AMYLASE,  in the last 168 hours No results found for this basename: AMMONIA,  in the last 168 hours CBC:  Recent Labs Lab 07/15/13 2130 07/16/13 0344 07/17/13 0642 07/18/13 0618  WBC 5.3 5.8 5.3 6.2  NEUTROABS 3.8 4.3 3.6 4.3  HGB 7.4* 7.8* 7.2* 8.3*  HCT 24.3* 26.0* 24.1* 28.1*  MCV 81.5 84.1 84.0 84.4  PLT 306 345 309 361   Cardiac Enzymes:  Recent Labs Lab 07/16/13 0344 07/16/13 0653 07/16/13 1253  TROPONINI <0.30 <0.30 <0.30   BNP (last 3 results)  Recent Labs  03/08/13 1747 03/18/13 1930 07/15/13 2130  PROBNP 9784.0* 2997.0* 4293.0*   CBG: No results found for this basename: GLUCAP,  in the last 168 hours  Recent Results (from the past 240 hour(s))  MRSA PCR SCREENING     Status: Abnormal    Collection Time    07/16/13  2:37 AM      Result Value Ref Range Status   MRSA by PCR POSITIVE (*) NEGATIVE Final   Comment:            The GeneXpert MRSA Assay (FDA     approved for NASAL specimens     only), is one component of a     comprehensive MRSA colonization     surveillance program. It is not     intended to diagnose MRSA     infection nor to guide or     monitor treatment for     MRSA infections.     RESULT CALLED TO, READ BACK BY AND VERIFIED WITH:     M.ROSENBURGER RN [phone] 07/16/13 E.GADDY     Studies: Nm Pulmonary Perf And Vent  07/17/2013   CLINICAL DATA:  Right leg pain and swelling. Question pulmonary embolus.  EXAM: NUCLEAR MEDICINE VENTILATION - PERFUSION LUNG SCAN  TECHNIQUE: Ventilation images were obtained in multiple projections using inhaled aerosol technetium 99 M DTPA. Perfusion images were obtained in multiple projections after intravenous injection of Tc-71m MAA.  RADIOPHARMACEUTICALS:  40 mCi Tc-58m DTPA aerosol and 6 mCi Tc-36m MAA  COMPARISON:  PA and lateral chest 07/15/2013.  CT scan 04/08/2010.  FINDINGS: Ventilation: No focal ventilation defect.  Perfusion: No wedge shaped peripheral perfusion defects to suggest acute pulmonary embolism.  IMPRESSION: Negative exam.   Electronically Signed   By: Inge Rise M.D.   On: 07/17/2013 12:32    Scheduled Meds: . carvedilol  3.125 mg Oral BID WC  . Chlorhexidine Gluconate Cloth  6 each Topical Q0600  . divalproex  500 mg Oral BID  . ferrous sulfate  325 mg Oral BID WC  . isosorbide-hydrALAZINE  1 tablet Oral BID  . lisinopril  20 mg Oral BID  . metolazone  5 mg Oral BID  . mupirocin ointment  1 application Nasal BID  . pantoprazole  40 mg Oral BID  . phenytoin  200 mg Oral Daily  . phenytoin  300 mg Oral QHS  . potassium chloride  10 mEq Oral Daily  . simvastatin  40 mg Oral QPM  . sodium chloride  3 mL Intravenous Q12H  . spironolactone  50 mg Oral Daily   Continuous Infusions: . furosemide  (LASIX) infusion 15 mg/hr (07/17/13 1940)     [facility] Pager [phone] If 8PM-8AM, please contact night-coverage at [email or web address], password [facility] 07/18/2013, 9:51 AM  LOS: 3 days

## 2013-07-18 NOTE — Progress Notes (Signed)
Advanced Heart Failure Rounding Note  Referring Physician: Dr. Olevia Bowens  Primary Physician: [name]  Primary Cardiologist: Dr. Marylou Mccoy [facility])  Subjective:    Mr. Faircloth is a 68 y.o. year old male with prior hx/o cocaine abuse, GI bleed, systolic CHF, CAD s/p CABG 2009, and stage 3 CKD.   Presented to the ED 07/16/13 with progressive LE edema for 1-2 weeks. Pertinent labs on admission Pro BNP 4300, Hgb 7.4, Cr 1.06, d-dimer 1.05, positive cocaine and troponin < 0.30. He had CT angio showing atherosclerotic disease. Bilateral venous dopplers negative for DVT and VQ scan negative for PE. He was started on lasix gtt 8 mg/hr with minimal UOP and rise in creatinine so it was discontinued. Prior to being admitted reports that he could walk about 1/2 block with no SOB. Denies orthopnea or PND. He reports he can't remember the last time he had a catheterization.   ECHO EF 50-55% mild RV dysfunction grade 2 DD  CTA (4/29) LEs showed significant PAD and LE edema. Received 100cc contrast   Increased lasix gtt to 15 mg/hr and gave metolazone 5 mg BID. Weight down 8 lbs, 24 hr I/O -5.7 liters.      Objective:   Weight Range:  Vital Signs:   Temp:  [97.6 F (36.4 C)-98 F (36.7 C)] 97.6 F (36.4 C) (05/01 0731) Pulse Rate:  [60-77] 71 (05/01 0809) Resp:  [18-22] 22 (05/01 0731) BP: (156-180)/(73-86) 180/78 mmHg (05/01 0809) SpO2:  [95 %-99 %] 95 % (05/01 0731) Weight:  [175 lb 4.3 oz (79.5 kg)-176 lb 4.8 oz (79.969 kg)] 175 lb 4.3 oz (79.5 kg) (05/01 0731) Last BM Date: 07/17/13  Weight change: Filed Weights   07/17/13 0511 07/18/13 0541 07/18/13 0731  Weight: 183 lb 3.2 oz (83.1 kg) 176 lb 4.8 oz (79.969 kg) 175 lb 4.3 oz (79.5 kg)    Intake/Output:   Intake/Output Summary (Last 24 hours) at 07/18/13 0959 Last data filed at 07/18/13 0945  Gross per 24 hour  Intake    723 ml  Output   8250 ml  Net  -7527 ml     Physical Exam: General: Chronically ill appearing. No resp  difficulty; sitting up in chair  HEENT: normal  Neck: supple. JVP to earlobe . Carotids 2+ bilat; no bruits. No lymphadenopathy or thryomegaly appreciated.  Cor: PMI nondisplaced. Regular rate & rhythm. No rubs, gallops or murmurs. +s3  Lungs: clear  Abdomen: soft, nontender, nondistended. No hepatosplenomegaly. No bruits or masses. Good bowel sounds.  Extremities: no cyanosis, clubbing, rash, bilateral LE woody edema, tender to light touch  Neuro: alert & orientedx3, cranial nerves grossly intact. moves all 4 extremities w/o difficulty. Affect pleasant  Telemetry: SR 60-70s  Labs: Basic Metabolic Panel:  Recent Labs Lab 07/15/13 2130 07/16/13 0344 07/17/13 0642 07/18/13 0618  NA 141 140 141 139  K 4.3 4.0 4.3 4.2  CL 105 102 103 95*  CO2 21 23 24 29   GLUCOSE 73 102* 106* 84  BUN 21 23 24* 21  CREATININE 1.06 1.20 1.49* 1.29  CALCIUM 8.5 8.6 8.2* 9.1    Liver Function Tests:  Recent Labs Lab 07/15/13 2130 07/16/13 0344 07/17/13 0642 07/18/13 0618  AST 43* 46* 41* 39*  ALT 20 22 19 20   ALKPHOS 78 73 91 127*  BILITOT 0.8 1.1 0.5 0.6  PROT 7.0 7.3 6.6 7.7  ALBUMIN 2.8* 3.1* 2.5* 3.0*   No results found for this basename: LIPASE, AMYLASE,  in the last 168 hours  No results found for this basename: AMMONIA,  in the last 168 hours  CBC:  Recent Labs Lab 07/15/13 2130 07/16/13 0344 07/17/13 0642 07/18/13 0618  WBC 5.3 5.8 5.3 6.2  NEUTROABS 3.8 4.3 3.6 4.3  HGB 7.4* 7.8* 7.2* 8.3*  HCT 24.3* 26.0* 24.1* 28.1*  MCV 81.5 84.1 84.0 84.4  PLT 306 345 309 361    Cardiac Enzymes:  Recent Labs Lab 07/16/13 0344 07/16/13 0653 07/16/13 1253  TROPONINI <0.30 <0.30 <0.30    BNP: BNP (last 3 results)  Recent Labs  03/08/13 1747 03/18/13 1930 07/15/13 2130  PROBNP 9784.0* 2997.0* 4293.0*     Other results:  EKG:   Imaging: Nm Pulmonary Perf And Vent  07/17/2013   CLINICAL DATA:  Right leg pain and swelling. Question pulmonary embolus.  EXAM:  NUCLEAR MEDICINE VENTILATION - PERFUSION LUNG SCAN  TECHNIQUE: Ventilation images were obtained in multiple projections using inhaled aerosol technetium 99 M DTPA. Perfusion images were obtained in multiple projections after intravenous injection of Tc-34m MAA.  RADIOPHARMACEUTICALS:  40 mCi Tc-65m DTPA aerosol and 6 mCi Tc-71m MAA  COMPARISON:  PA and lateral chest 07/15/2013.  CT scan 04/08/2010.  FINDINGS: Ventilation: No focal ventilation defect.  Perfusion: No wedge shaped peripheral perfusion defects to suggest acute pulmonary embolism.  IMPRESSION: Negative exam.   Electronically Signed   By: Inge Rise M.D.   On: 07/17/2013 12:32      Medications:     Scheduled Medications: . carvedilol  3.125 mg Oral BID WC  . Chlorhexidine Gluconate Cloth  6 each Topical Q0600  . divalproex  500 mg Oral BID  . ferrous sulfate  325 mg Oral BID WC  . isosorbide-hydrALAZINE  1 tablet Oral BID  . lisinopril  20 mg Oral BID  . metolazone  5 mg Oral BID  . mupirocin ointment  1 application Nasal BID  . pantoprazole  40 mg Oral BID  . phenytoin  200 mg Oral Daily  . phenytoin  300 mg Oral QHS  . potassium chloride  10 mEq Oral Daily  . simvastatin  40 mg Oral QPM  . sodium chloride  3 mL Intravenous Q12H  . spironolactone  50 mg Oral Daily     Infusions: . furosemide (LASIX) infusion 15 mg/hr (07/17/13 1940)     PRN Medications:  morphine injection   Assessment:   1) A/C diastolic heart failure  2) HTN  3) Polysubstance abuse  4) CAD  - s/p CABG  5) chronic anemia  6) Acute renal failure stage III   Plan/Discussion:    Mr. Shanholtzer is a 68 yo male with a history of HTN, diastolic HF, HTN, CAD s/p CABG 2009 and chronic anemia. He presented to the hospital after progressive LE edema and bilateral leg pain. Venous dopplers were negative for DVT and CT scan was non-significant. VQ scan was negative for PE. EF 50-55% with mild RV dysfunction  Remains markedly volume overloaded.  Will continue lasix gtt and metolazone. Would continue to diurese until Cr starts increasing. He remains very hypertensive on multiple medications.   Consult CR to walk with patient and provide education.   Length of Stay: [place] NP-C 07/18/2013, 9:59 AM  Advanced Heart Failure Team Pager [phone] (M-F; 7a - 4p)  Please contact [facility] for night-coverage after hours (4p -7a ) and weekends on [email or web address]

## 2013-07-18 NOTE — Progress Notes (Signed)
Patient had C/O sharp BLE pain. PRN Morphine administered as ordered. Upon re-assessment, patient stated pain was not relieved and requested additional pain medication.  Tom Callahan,NP paged and received order for PRN Oxy IR.  That was then administered. Patient stated before even taking the Oxy IR that he it was not going to work, but he still requested and took the medication as ordered. Patient currently resting comfortably and appears to be in no acute distress, though when asked he still has C/O pain of 8/10. Will continue to monitor. Tresa Endo

## 2013-07-18 NOTE — Progress Notes (Addendum)
Advanced Heart Failure Rounding Note  Referring Physician: Dr. Olevia Bowens  Primary Physician: [name]  Primary Cardiologist: Dr. Marylou Mccoy [facility])  Subjective:    Charles [name] is a 68 y.o. year old male with prior hx/o cocaine abuse, GI bleed, systolic CHF, CAD s/p CABG 2009, and stage 3 CKD.   Presented to the ED 07/16/13 with progressive LE edema for 1-2 weeks. Pertinent labs on admission Pro BNP 4300, Hgb 7.4, Cr 1.06, d-dimer 1.05, positive cocaine and troponin < 0.30. He had CT angio showing atherosclerotic disease. Bilateral venous dopplers negative for DVT and VQ scan negative for PE. He was started on lasix gtt 8 mg/hr with minimal UOP and rise in creatinine so it was discontinued. Prior to being admitted reports that he could walk about 1/2 block with no SOB. Denies orthopnea or PND. He reports he can't remember the last time he had a catheterization.   ECHO EF 50-55% mild RV dysfunction grade 2 DD  CTA (4/29) LEs showed significant PAD and LE edema. Received 100cc contrast   Increased lasix gtt to 15 mg/hr and gave metolazone 5 mg BID. Weight down 8 lbs, 24 hr I/O -5.7 liters.      Objective:   Weight Range:  Vital Signs:   Temp:  [97.6 F (36.4 C)-98 F (36.7 C)] 97.6 F (36.4 C) (05/01 0731) Pulse Rate:  [60-77] 71 (05/01 0809) Resp:  [18-22] 22 (05/01 0731) BP: (156-180)/(73-86) 180/78 mmHg (05/01 0809) SpO2:  [95 %-99 %] 95 % (05/01 0731) Weight:  [79.5 kg (175 lb 4.3 oz)-79.969 kg (176 lb 4.8 oz)] 79.5 kg (175 lb 4.3 oz) (05/01 0731) Last BM Date: 07/17/13  Weight change: Filed Weights   07/17/13 0511 07/18/13 0541 07/18/13 0731  Weight: 83.1 kg (183 lb 3.2 oz) 79.969 kg (176 lb 4.8 oz) 79.5 kg (175 lb 4.3 oz)    Intake/Output:   Intake/Output Summary (Last 24 hours) at 07/18/13 1056 Last data filed at 07/18/13 1051  Gross per 24 hour  Intake    723 ml  Output   9000 ml  Net  -8277 ml     Physical Exam: General: Chronically ill appearing. No resp  difficulty; sitting up in chair  HEENT: normal  Neck: supple. JVP to earlobe . Carotids 2+ bilat; no bruits. No lymphadenopathy or thryomegaly appreciated.  Cor: PMI nondisplaced. Regular rate & rhythm. No rubs, gallops or murmurs. +s3  Lungs: clear  Abdomen: soft, nontender, nondistended. No hepatosplenomegaly. No bruits or masses. Good bowel sounds.  Extremities: no cyanosis, clubbing, rash, bilateral LE woody edema, tender to light touch  Neuro: alert & orientedx3, cranial nerves grossly intact. moves all 4 extremities w/o difficulty. Affect pleasant  Telemetry: SR 60-70s  Labs: Basic Metabolic Panel:  Recent Labs Lab 07/15/13 2130 07/16/13 0344 07/17/13 0642 07/18/13 0618  NA 141 140 141 139  K 4.3 4.0 4.3 4.2  CL 105 102 103 95*  CO2 21 23 24 29   GLUCOSE 73 102* 106* 84  BUN 21 23 24* 21  CREATININE 1.06 1.20 1.49* 1.29  CALCIUM 8.5 8.6 8.2* 9.1    Liver Function Tests:  Recent Labs Lab 07/15/13 2130 07/16/13 0344 07/17/13 0642 07/18/13 0618  AST 43* 46* 41* 39*  ALT 20 22 19 20   ALKPHOS 78 73 91 127*  BILITOT 0.8 1.1 0.5 0.6  PROT 7.0 7.3 6.6 7.7  ALBUMIN 2.8* 3.1* 2.5* 3.0*   No results found for this basename: LIPASE, AMYLASE,  in the last 168 hours  No results found for this basename: AMMONIA,  in the last 168 hours  CBC:  Recent Labs Lab 07/15/13 2130 07/16/13 0344 07/17/13 0642 07/18/13 0618  WBC 5.3 5.8 5.3 6.2  NEUTROABS 3.8 4.3 3.6 4.3  HGB 7.4* 7.8* 7.2* 8.3*  HCT 24.3* 26.0* 24.1* 28.1*  MCV 81.5 84.1 84.0 84.4  PLT 306 345 309 361    Cardiac Enzymes:  Recent Labs Lab 07/16/13 0344 07/16/13 0653 07/16/13 1253  TROPONINI <0.30 <0.30 <0.30    BNP: BNP (last 3 results)  Recent Labs  03/08/13 1747 03/18/13 1930 07/15/13 2130  PROBNP 9784.0* 2997.0* 4293.0*     Other results:  EKG:   Imaging: Nm Pulmonary Perf And Vent  07/17/2013   CLINICAL DATA:  Right leg pain and swelling. Question pulmonary embolus.  EXAM:  NUCLEAR MEDICINE VENTILATION - PERFUSION LUNG SCAN  TECHNIQUE: Ventilation images were obtained in multiple projections using inhaled aerosol technetium 99 M DTPA. Perfusion images were obtained in multiple projections after intravenous injection of Tc-16m MAA.  RADIOPHARMACEUTICALS:  40 mCi Tc-38m DTPA aerosol and 6 mCi Tc-65m MAA  COMPARISON:  PA and lateral chest 07/15/2013.  CT scan 04/08/2010.  FINDINGS: Ventilation: No focal ventilation defect.  Perfusion: No wedge shaped peripheral perfusion defects to suggest acute pulmonary embolism.  IMPRESSION: Negative exam.   Electronically Signed   By: Inge Rise M.D.   On: 07/17/2013 12:32     Medications:     Scheduled Medications: . carvedilol  3.125 mg Oral BID WC  . Chlorhexidine Gluconate Cloth  6 each Topical Q0600  . divalproex  500 mg Oral BID  . ferrous sulfate  325 mg Oral BID WC  . heparin subcutaneous  5,000 Units Subcutaneous 3 times per day  . isosorbide-hydrALAZINE  1 tablet Oral TID  . lisinopril  20 mg Oral BID  . metolazone  5 mg Oral BID  . mupirocin ointment  1 application Nasal BID  . pantoprazole  40 mg Oral BID  . phenytoin  200 mg Oral Daily  . phenytoin  300 mg Oral QHS  . potassium chloride  10 mEq Oral Daily  . simvastatin  40 mg Oral QPM  . sodium chloride  3 mL Intravenous Q12H  . spironolactone  50 mg Oral Daily    Infusions: . furosemide (LASIX) infusion 15 mg/hr (07/17/13 1940)    PRN Medications: morphine injection   Assessment:   1) A/C diastolic heart failure  2) HTN  3) Polysubstance abuse  4) CAD  - s/p CABG  5) chronic anemia  6) Acute renal failure stage III   Plan/Discussion:    Charles [name] is a 68 yo male with a history of HTN, diastolic HF, HTN, CAD s/p CABG 2009 and chronic anemia. He presented to the hospital after progressive LE edema and bilateral leg pain. Venous dopplers were negative for DVT and CT scan was non-significant. VQ scan was negative for PE. EF 50-55% with  mild RV dysfunction  Remains markedly volume overloaded. Will continue lasix gtt and metolazone. Would continue to diurese until Cr starts increasing. He remains very hypertensive on multiple medications.   Consult CR to walk with patient and provide education.   Length of Stay: 3 Shaune Pascal Catharina Pica NP-C 07/18/2013, 10:56 AM  Advanced Heart Failure Team Pager [phone] (M-F; 7a - 4p)  Please contact [facility] for night-coverage after hours (4p -7a ) and weekends on [email or web address]  Patient seen and examined with Junie Bame, NP. We discussed all aspects  of the encounter. I agree with the assessment and plan as stated above.   Urine output markedly improved with lasix gtt and metolazone. Continues with significant volume overload. Renal function improved. Would continue current regimen. Watch electrolytes. Add amlodipine for HTN.   Shaune Pascal Candy Leverett,MD 10:57 AM

## 2013-07-18 NOTE — Progress Notes (Signed)
Patient refused qhs SQ Heparin. Educated patient on the importance of preventing DVT, patient continued to decline.

## 2013-07-19 LAB — CBC WITH DIFFERENTIAL/PLATELET
Basophils Absolute: 0 10*3/uL (ref 0.0–0.1)
Basophils Relative: 0 % (ref 0–1)
Eosinophils Absolute: 0.3 10*3/uL (ref 0.0–0.7)
Eosinophils Relative: 5 % (ref 0–5)
HEMATOCRIT: 30.7 % — AB (ref 39.0–52.0)
HEMOGLOBIN: 9.3 g/dL — AB (ref 13.0–17.0)
LYMPHS PCT: 12 % (ref 12–46)
Lymphs Abs: 0.8 10*3/uL (ref 0.7–4.0)
MCH: 24.8 pg — ABNORMAL LOW (ref 26.0–34.0)
MCHC: 30.3 g/dL (ref 30.0–36.0)
MCV: 81.9 fL (ref 78.0–100.0)
MONO ABS: 1 10*3/uL (ref 0.1–1.0)
MONOS PCT: 17 % — AB (ref 3–12)
Neutro Abs: 4.1 10*3/uL (ref 1.7–7.7)
Neutrophils Relative %: 66 % (ref 43–77)
Platelets: 403 10*3/uL — ABNORMAL HIGH (ref 150–400)
RBC: 3.75 MIL/uL — ABNORMAL LOW (ref 4.22–5.81)
RDW: 18.5 % — ABNORMAL HIGH (ref 11.5–15.5)
WBC: 6.2 10*3/uL (ref 4.0–10.5)

## 2013-07-19 LAB — COMPREHENSIVE METABOLIC PANEL
ALBUMIN: 2.9 g/dL — AB (ref 3.5–5.2)
ALT: 17 U/L (ref 0–53)
AST: 33 U/L (ref 0–37)
Alkaline Phosphatase: 93 U/L (ref 39–117)
BUN: 23 mg/dL (ref 6–23)
CALCIUM: 9.4 mg/dL (ref 8.4–10.5)
CO2: 33 mEq/L — ABNORMAL HIGH (ref 19–32)
Chloride: 87 mEq/L — ABNORMAL LOW (ref 96–112)
Creatinine, Ser: 1.51 mg/dL — ABNORMAL HIGH (ref 0.50–1.35)
GFR calc Af Amer: 53 mL/min — ABNORMAL LOW (ref >90)
GFR calc non Af Amer: 45 mL/min — ABNORMAL LOW (ref >90)
GLUCOSE: 108 mg/dL — AB (ref 70–99)
Potassium: 4.1 mEq/L (ref 3.7–5.3)
SODIUM: 137 meq/L (ref 137–147)
TOTAL PROTEIN: 7.5 g/dL (ref 6.0–8.3)
Total Bilirubin: 0.6 mg/dL (ref 0.3–1.2)

## 2013-07-19 LAB — FOLATE RBC: RBC Folate: 1177 ng/mL — ABNORMAL HIGH (ref >280)

## 2013-07-19 LAB — PRO B NATRIURETIC PEPTIDE: Pro B Natriuretic peptide (BNP): 981.3 pg/mL — ABNORMAL HIGH (ref 0–125)

## 2013-07-19 MED ORDER — POLYETHYLENE GLYCOL 3350 17 G PO PACK
17.0000 g | PACK | Freq: Every day | ORAL | Status: DC
  Administered 2013-07-19 – 2013-07-20 (×2): 17 g via ORAL
  Filled 2013-07-19 (×3): qty 1

## 2013-07-19 NOTE — Progress Notes (Signed)
[facility] PROGRESS NOTE  Filed Weights   07/18/13 0541 07/18/13 0731 07/19/13 0616  Weight: 79.969 kg (176 lb 4.8 oz) 79.5 kg (175 lb 4.3 oz) 73.3 kg (161 lb 9.6 oz)        Intake/Output Summary (Last 24 hours) at 07/19/13 1007 Last data filed at 07/19/13 0857  Gross per 24 hour  Intake    960 ml  Output   7900 ml  Net  -6940 ml    Assessment/Plan: Acute systolic congestive heart failure, NYHA class 2: - Previous weight around 80 Kg. Appreciate HF assistance. - Low sodium diet, Fluid restrict apply TED hose. - Hold lasix gtt and metolazone, became hypotensive and cr has bump. Check orthostatics. - Liberalize diet - V/Q scan negative for PE - Echo 4.29.2015 as below. D/c Norvasc.  Cocaine abuse: - counseling.  Chronic kidney disease (CKD), stage III (moderate) - At baseline.  Moderate to severe pulmonary hypertension: - previous echo showed PA pr 76 mmhg. Repeated showed > 50 mmhg  Anemia:  - Hemoglobin is 7.4 today. - Anemia ferritin 24 cont iron pills. Check RBC folate. - Check Hemoccult. Type and screen.  - start DVT prophylaxis  Transaminitis: - split 2:1. ? Due to ETOH.  CKD: - Avoid NSAIDs.  Seizure disorder:  No reported seizure activity. Continue home management.    Code Status: full Family Communication: daughter and wife  Disposition Plan: inpatient   Consultants:  none  Procedures: ECHO: estimated ejection fraction was in the range of 50% to 55%. Possible mild hypokinesis of the basal-midinferolateral and inferior myocardium. Features are consistent with a pseudonormal left ventricular filling pattern, with concomitant abnormal relaxation and increased filling pressure (grade 2 diastolic dysfunction). Pulmonary arteries: Systolic pressure was moderately increased. PA peak pressure: 36mm Hg.  Antibiotics:  None  HPI/Subjective: lightheadeness upon standing.  Objective: Filed Vitals:   07/18/13 1515 07/18/13 2118 07/19/13  0157 07/19/13 0616  BP: 160/82 114/61 120/63 90/65  Pulse: 73 78 72 75  Temp: 97.6 F (36.4 C) 97.8 F (36.6 C) 97.7 F (36.5 C) 97.9 F (36.6 C)  TempSrc: Oral Oral Oral Oral  Resp: 20 18 17 18   Height:      Weight:    73.3 kg (161 lb 9.6 oz)  SpO2: 95% 100% 97% 100%     Exam:  General: Alert, awake, oriented x3, in no acute distress.  HEENT: No bruits, no goiter. +JVD Heart: Regular rate and rhythm, anasarca. Lower extremity pain B/L Lungs: Good air movement, clear Abdomen: Soft, nontender, nondistended, positive bowel sounds.     Data Reviewed: Basic Metabolic Panel:  Recent Labs Lab 07/15/13 2130 07/16/13 0344 07/17/13 0642 07/18/13 0618 07/19/13 0430  NA 141 140 141 139 137  K 4.3 4.0 4.3 4.2 4.1  CL 105 102 103 95* 87*  CO2 21 23 24 29  33*  GLUCOSE 73 102* 106* 84 108*  BUN 21 23 24* 21 23  CREATININE 1.06 1.20 1.49* 1.29 1.51*  CALCIUM 8.5 8.6 8.2* 9.1 9.4   Liver Function Tests:  Recent Labs Lab 07/15/13 2130 07/16/13 0344 07/17/13 0642 07/18/13 0618 07/19/13 0430  AST 43* 46* 41* 39* 33  ALT 20 22 19 20 17   ALKPHOS 78 73 91 127* 93  BILITOT 0.8 1.1 0.5 0.6 0.6  PROT 7.0 7.3 6.6 7.7 7.5  ALBUMIN 2.8* 3.1* 2.5* 3.0* 2.9*   No results found for this basename: LIPASE, AMYLASE,  in the last 168 hours No results found for this  basename: AMMONIA,  in the last 168 hours CBC:  Recent Labs Lab 07/15/13 2130 07/16/13 0344 07/17/13 0642 07/18/13 0618 07/19/13 0430  WBC 5.3 5.8 5.3 6.2 6.2  NEUTROABS 3.8 4.3 3.6 4.3 4.1  HGB 7.4* 7.8* 7.2* 8.3* 9.3*  HCT 24.3* 26.0* 24.1* 28.1* 30.7*  MCV 81.5 84.1 84.0 84.4 81.9  PLT 306 345 309 361 403*   Cardiac Enzymes:  Recent Labs Lab 07/16/13 0344 07/16/13 0653 07/16/13 1253  TROPONINI <0.30 <0.30 <0.30   BNP (last 3 results)  Recent Labs  03/08/13 1747 03/18/13 1930 07/15/13 2130  PROBNP 9784.0* 2997.0* 4293.0*   CBG: No results found for this basename: GLUCAP,  in the last 168  hours  Recent Results (from the past 240 hour(s))  MRSA PCR SCREENING     Status: Abnormal   Collection Time    07/16/13  2:37 AM      Result Value Ref Range Status   MRSA by PCR POSITIVE (*) NEGATIVE Final   Comment:            The GeneXpert MRSA Assay (FDA     approved for NASAL specimens     only), is one component of a     comprehensive MRSA colonization     surveillance program. It is not     intended to diagnose MRSA     infection nor to guide or     monitor treatment for     MRSA infections.     RESULT CALLED TO, READ BACK BY AND VERIFIED WITH:     M.ROSENBURGER RN [phone] 07/16/13 E.GADDY     Studies: Nm Pulmonary Perf And Vent  07/17/2013   CLINICAL DATA:  Right leg pain and swelling. Question pulmonary embolus.  EXAM: NUCLEAR MEDICINE VENTILATION - PERFUSION LUNG SCAN  TECHNIQUE: Ventilation images were obtained in multiple projections using inhaled aerosol technetium 99 M DTPA. Perfusion images were obtained in multiple projections after intravenous injection of Tc-47m MAA.  RADIOPHARMACEUTICALS:  40 mCi Tc-23m DTPA aerosol and 6 mCi Tc-82m MAA  COMPARISON:  PA and lateral chest 07/15/2013.  CT scan 04/08/2010.  FINDINGS: Ventilation: No focal ventilation defect.  Perfusion: No wedge shaped peripheral perfusion defects to suggest acute pulmonary embolism.  IMPRESSION: Negative exam.   Electronically Signed   By: Inge Rise M.D.   On: 07/17/2013 12:32    Scheduled Meds: . amLODipine  10 mg Oral Daily  . carvedilol  3.125 mg Oral BID WC  . Chlorhexidine Gluconate Cloth  6 each Topical Q0600  . divalproex  500 mg Oral BID  . ferrous sulfate  325 mg Oral BID WC  . heparin subcutaneous  5,000 Units Subcutaneous 3 times per day  . isosorbide-hydrALAZINE  1 tablet Oral TID  . lisinopril  20 mg Oral BID  . mupirocin ointment  1 application Nasal BID  . pantoprazole  40 mg Oral BID  . phenytoin  200 mg Oral Daily  . phenytoin  300 mg Oral QHS  . potassium chloride  10 mEq  Oral Daily  . simvastatin  40 mg Oral QPM  . sodium chloride  3 mL Intravenous Q12H  . spironolactone  50 mg Oral Daily   Continuous Infusions:     [facility] Pager [phone] If 8PM-8AM, please contact night-coverage at [email or web address], password [facility] 07/19/2013, 10:07 AM  LOS: 4 days

## 2013-07-19 NOTE — Progress Notes (Signed)
Primary cardiologist: Dr. Marylou Mccoy [facility])   Subjective:   Complains of abdominal discomfort since early morning. Has been able to eat, also reports bowel movements. No emesis.   Objective:   Temp:  [97.6 F (36.4 C)-97.9 F (36.6 C)] 97.9 F (36.6 C) (05/02 0616) Pulse Rate:  [72-78] 75 (05/02 0616) Resp:  [17-20] 18 (05/02 0616) BP: (90-160)/(61-82) 90/65 mmHg (05/02 0616) SpO2:  [95 %-100 %] 100 % (05/02 0616) Weight:  [161 lb 2.5 oz (73.1 kg)-161 lb 9.6 oz (73.3 kg)] 161 lb 2.5 oz (73.1 kg) (05/02 1009) Last BM Date: 07/18/13  Filed Weights   07/18/13 0731 07/19/13 0616 07/19/13 1009  Weight: 175 lb 4.3 oz (79.5 kg) 161 lb 9.6 oz (73.3 kg) 161 lb 2.5 oz (73.1 kg)    Intake/Output Summary (Last 24 hours) at 07/19/13 1241 Last data filed at 07/19/13 1142  Gross per 24 hour  Intake    960 ml  Output   7325 ml  Net  -6365 ml    Telemetry: Sinus rhythm.  Exam:  General: No distress.  Lungs: No rales.  Cardiac: RRR, no gallop.  Abdomen: NABS, no guarding.  Extremities: Chronic appearing edema.   Lab Results:  Basic Metabolic Panel:  Recent Labs Lab 07/17/13 0642 07/18/13 0618 07/19/13 0430  NA 141 139 137  K 4.3 4.2 4.1  CL 103 95* 87*  CO2 24 29 33*  GLUCOSE 106* 84 108*  BUN 24* 21 23  CREATININE 1.49* 1.29 1.51*  CALCIUM 8.2* 9.1 9.4    Liver Function Tests:  Recent Labs Lab 07/17/13 0642 07/18/13 0618 07/19/13 0430  AST 41* 39* 33  ALT 19 20 17   ALKPHOS 91 127* 93  BILITOT 0.5 0.6 0.6  PROT 6.6 7.7 7.5  ALBUMIN 2.5* 3.0* 2.9*    CBC:  Recent Labs Lab 07/17/13 0642 07/18/13 0618 07/19/13 0430  WBC 5.3 6.2 6.2  HGB 7.2* 8.3* 9.3*  HCT 24.1* 28.1* 30.7*  MCV 84.0 84.4 81.9  PLT 309 361 403*    Cardiac Enzymes:  Recent Labs Lab 07/16/13 0344 07/16/13 0653 07/16/13 1253  TROPONINI <0.30 <0.30 <0.30    BNP:  Recent Labs  03/18/13 1930 07/15/13 2130 07/19/13 0430  PROBNP 2997.0* 4293.0* 981.3*     Coagulation:  Recent Labs Lab 07/15/13 2223  INR 1.24    Echocardiogram (07/16/13): Study Conclusions  - Left ventricle: The cavity size was normal. Systolic function was normal. The estimated ejection fraction was in the range of 50% to 55%. Possible mild hypokinesis of the basal-midinferolateral and inferior myocardium. Features are consistent with a pseudonormal left ventricular filling pattern, with concomitant abnormal relaxation and increased filling pressure (grade 2 diastolic dysfunction). - Left atrium: The atrium was mildly dilated. - Right ventricle: The cavity size was mildly dilated. Wall thickness was normal. Systolic function was mildly reduced. - Pulmonary arteries: Systolic pressure was moderately increased. PA peak pressure: 59mm Hg (S). Impressions:  - Comparison with 2012 study shows a similar distribution of the wall motion abnormality (current study shows interval improvement).   Medications:   Scheduled Medications: . carvedilol  3.125 mg Oral BID WC  . Chlorhexidine Gluconate Cloth  6 each Topical Q0600  . divalproex  500 mg Oral BID  . ferrous sulfate  325 mg Oral BID WC  . heparin subcutaneous  5,000 Units Subcutaneous 3 times per day  . isosorbide-hydrALAZINE  1 tablet Oral TID  . lisinopril  20 mg Oral BID  . mupirocin  ointment  1 application Nasal BID  . pantoprazole  40 mg Oral BID  . phenytoin  200 mg Oral Daily  . phenytoin  300 mg Oral QHS  . polyethylene glycol  17 g Oral Daily  . potassium chloride  10 mEq Oral Daily  . simvastatin  40 mg Oral QPM  . sodium chloride  3 mL Intravenous Q12H  . spironolactone  50 mg Oral Daily      PRN Medications:  morphine injection, oxyCODONE   Assessment:   1. Acute on chronic diastolic heart failure, LVEF 50-55%.  2. Hypertension.  3. Polysubstance abuse.  4. CAD status post CABG.  5. Chronic anemia.  6. Acute on chronic renal failure, stage III.  7. Abdominal pain,  LFTs normal.   Plan/Discussion:    He has lost approximately 25 pounds with diuresis overall. Blood pressure on the low side last 24 hours, creatinine has gone from 1.4 down to 1.2 up to 1.5. Had 8 L out yesterday. Lasix and metolazone held today, recheck BMET. in a.m. Will likely need to resume lower dose diuretic tomorrow.   Satira Sark, M.D., F.A.C.C.

## 2013-07-20 DIAGNOSIS — E119 Type 2 diabetes mellitus without complications: Secondary | ICD-10-CM

## 2013-07-20 LAB — PHENYTOIN LEVEL, TOTAL: Phenytoin Lvl: 6.3 ug/mL — ABNORMAL LOW (ref 10.0–20.0)

## 2013-07-20 LAB — COMPREHENSIVE METABOLIC PANEL
ALK PHOS: 104 U/L (ref 39–117)
ALT: 14 U/L (ref 0–53)
AST: 28 U/L (ref 0–37)
Albumin: 3 g/dL — ABNORMAL LOW (ref 3.5–5.2)
BILIRUBIN TOTAL: 0.5 mg/dL (ref 0.3–1.2)
BUN: 31 mg/dL — AB (ref 6–23)
CHLORIDE: 87 meq/L — AB (ref 96–112)
CO2: 33 mEq/L — ABNORMAL HIGH (ref 19–32)
Calcium: 8.9 mg/dL (ref 8.4–10.5)
Creatinine, Ser: 1.77 mg/dL — ABNORMAL HIGH (ref 0.50–1.35)
GFR calc non Af Amer: 37 mL/min — ABNORMAL LOW (ref >90)
GFR, EST AFRICAN AMERICAN: 43 mL/min — AB (ref >90)
GLUCOSE: 95 mg/dL (ref 70–99)
POTASSIUM: 4.5 meq/L (ref 3.7–5.3)
Sodium: 134 mEq/L — ABNORMAL LOW (ref 137–147)
Total Protein: 7.5 g/dL (ref 6.0–8.3)

## 2013-07-20 LAB — CBC WITH DIFFERENTIAL/PLATELET
BASOS ABS: 0 10*3/uL (ref 0.0–0.1)
Basophils Relative: 0 % (ref 0–1)
EOS PCT: 4 % (ref 0–5)
Eosinophils Absolute: 0.2 10*3/uL (ref 0.0–0.7)
HCT: 31.4 % — ABNORMAL LOW (ref 39.0–52.0)
Hemoglobin: 9.7 g/dL — ABNORMAL LOW (ref 13.0–17.0)
LYMPHS PCT: 17 % (ref 12–46)
Lymphs Abs: 0.9 10*3/uL (ref 0.7–4.0)
MCH: 25.1 pg — ABNORMAL LOW (ref 26.0–34.0)
MCHC: 30.9 g/dL (ref 30.0–36.0)
MCV: 81.1 fL (ref 78.0–100.0)
Monocytes Absolute: 0.8 10*3/uL (ref 0.1–1.0)
Monocytes Relative: 14 % — ABNORMAL HIGH (ref 3–12)
NEUTROS ABS: 3.6 10*3/uL (ref 1.7–7.7)
Neutrophils Relative %: 65 % (ref 43–77)
Platelets: 383 10*3/uL (ref 150–400)
RBC: 3.87 MIL/uL — ABNORMAL LOW (ref 4.22–5.81)
RDW: 18.7 % — AB (ref 11.5–15.5)
WBC: 5.5 10*3/uL (ref 4.0–10.5)

## 2013-07-20 LAB — OCCULT BLOOD X 1 CARD TO LAB, STOOL: Fecal Occult Bld: NEGATIVE

## 2013-07-20 MED ORDER — GABAPENTIN 100 MG PO CAPS
100.0000 mg | ORAL_CAPSULE | Freq: Two times a day (BID) | ORAL | Status: DC
  Administered 2013-07-20 – 2013-07-21 (×3): 100 mg via ORAL
  Filled 2013-07-20 (×4): qty 1

## 2013-07-20 MED ORDER — OXYCODONE HCL 5 MG PO TABS
10.0000 mg | ORAL_TABLET | Freq: Four times a day (QID) | ORAL | Status: DC | PRN
  Administered 2013-07-20 (×2): 10 mg via ORAL
  Filled 2013-07-20 (×2): qty 2

## 2013-07-20 NOTE — Progress Notes (Signed)
[facility] PROGRESS NOTE  Filed Weights   07/19/13 0616 07/19/13 1009 07/20/13 0500  Weight: 73.3 kg (161 lb 9.6 oz) 73.1 kg (161 lb 2.5 oz) 73.256 kg (161 lb 8 oz)        Intake/Output Summary (Last 24 hours) at 07/20/13 0953 Last data filed at 07/19/13 2125  Gross per 24 hour  Intake    480 ml  Output   1625 ml  Net  -1145 ml    Assessment/Plan: Acute systolic congestive heart failure, NYHA class 2: - Previous weight around 80 Kg. Appreciate HF assistance. - Low sodium diet, Fluid restrict apply TED hose. - Held lasix gtt and metolazone, Cr cont to increase held spirinolaconte and KCL. - Liberalize diet - V/Q scan negative for PE - Echo 4.29.2015 as below.   Cocaine abuse: - counseling.  Chronic kidney disease (CKD), stage III (moderate) - At baseline.  Moderate to severe pulmonary hypertension: - previous echo showed PA pr 76 mmhg. Repeated showed > 50 mmhg  Anemia:  - Hemoglobin is 7.4 today. - Anemia ferritin 24 cont iron pills. Check RBC folate. - Check Hemoccult. Type and screen.  - start DVT prophylaxis  Transaminitis: - split 2:1.  Due to ETOH.  CKD: - Avoid NSAIDs.  Seizure disorder:  No reported seizure activity. Continue home management.    Code Status: full Family Communication: daughter and wife  Disposition Plan: inpatient   Consultants:  none  Procedures: ECHO: estimated ejection fraction was in the range of 50% to 55%. Possible mild hypokinesis of the basal-midinferolateral and inferior myocardium. Features are consistent with a pseudonormal left ventricular filling pattern, with concomitant abnormal relaxation and increased filling pressure (grade 2 diastolic dysfunction). Pulmonary arteries: Systolic pressure was moderately increased. PA peak pressure: 3mm Hg.  Antibiotics:  None  HPI/Subjective: lightheadeness upon standing resolved. B/l leg pain  Objective: Filed Vitals:   07/19/13 1454 07/19/13 1457 07/19/13  2123 07/20/13 0500  BP: 129/62 128/77 109/67 125/72  Pulse: 69 75 74 79  Temp: 98.1 F (36.7 C) 98.1 F (36.7 C) 98.1 F (36.7 C) 97.4 F (36.3 C)  TempSrc: Oral Oral Oral Oral  Resp: 18 18 19 19   Height:      Weight:    73.256 kg (161 lb 8 oz)  SpO2: 99%  99% 100%     Exam:  General: Alert, awake, oriented x3, in no acute distress.  HEENT: No bruits, no goiter. +JVD Heart: Regular rate and rhythm, anasarca. Lower extremity pain B/L 2+ edema. Lungs: Good air movement, clear Abdomen: Soft, nontender, nondistended, positive bowel sounds.     Data Reviewed: Basic Metabolic Panel:  Recent Labs Lab 07/16/13 0344 07/17/13 0642 07/18/13 0618 07/19/13 0430 07/20/13 0530  NA 140 141 139 137 134*  K 4.0 4.3 4.2 4.1 4.5  CL 102 103 95* 87* 87*  CO2 23 24 29  33* 33*  GLUCOSE 102* 106* 84 108* 95  BUN 23 24* 21 23 31*  CREATININE 1.20 1.49* 1.29 1.51* 1.77*  CALCIUM 8.6 8.2* 9.1 9.4 8.9   Liver Function Tests:  Recent Labs Lab 07/16/13 0344 07/17/13 0642 07/18/13 0618 07/19/13 0430 07/20/13 0530  AST 46* 41* 39* 33 28  ALT 22 19 20 17 14   ALKPHOS 73 91 127* 93 104  BILITOT 1.1 0.5 0.6 0.6 0.5  PROT 7.3 6.6 7.7 7.5 7.5  ALBUMIN 3.1* 2.5* 3.0* 2.9* 3.0*   No results found for this basename: LIPASE, AMYLASE,  in the last 168 hours  No results found for this basename: AMMONIA,  in the last 168 hours CBC:  Recent Labs Lab 07/16/13 0344 07/17/13 0642 07/18/13 0618 07/19/13 0430 07/20/13 0530  WBC 5.8 5.3 6.2 6.2 5.5  NEUTROABS 4.3 3.6 4.3 4.1 3.6  HGB 7.8* 7.2* 8.3* 9.3* 9.7*  HCT 26.0* 24.1* 28.1* 30.7* 31.4*  MCV 84.1 84.0 84.4 81.9 81.1  PLT 345 309 361 403* 383   Cardiac Enzymes:  Recent Labs Lab 07/16/13 0344 07/16/13 0653 07/16/13 1253  TROPONINI <0.30 <0.30 <0.30   BNP (last 3 results)  Recent Labs  03/18/13 1930 07/15/13 2130 07/19/13 0430  PROBNP 2997.0* 4293.0* 981.3*   CBG: No results found for this basename: GLUCAP,  in the  last 168 hours  Recent Results (from the past 240 hour(s))  MRSA PCR SCREENING     Status: Abnormal   Collection Time    07/16/13  2:37 AM      Result Value Ref Range Status   MRSA by PCR POSITIVE (*) NEGATIVE Final   Comment:            The GeneXpert MRSA Assay (FDA     approved for NASAL specimens     only), is one component of a     comprehensive MRSA colonization     surveillance program. It is not     intended to diagnose MRSA     infection nor to guide or     monitor treatment for     MRSA infections.     RESULT CALLED TO, READ BACK BY AND VERIFIED WITH:     M.ROSENBURGER RN [phone] 07/16/13 E.GADDY     Studies: No results found.  Scheduled Meds: . carvedilol  3.125 mg Oral BID WC  . divalproex  500 mg Oral BID  . ferrous sulfate  325 mg Oral BID WC  . heparin subcutaneous  5,000 Units Subcutaneous 3 times per day  . isosorbide-hydrALAZINE  1 tablet Oral TID  . lisinopril  20 mg Oral BID  . mupirocin ointment  1 application Nasal BID  . pantoprazole  40 mg Oral BID  . phenytoin  200 mg Oral Daily  . phenytoin  300 mg Oral QHS  . polyethylene glycol  17 g Oral Daily  . potassium chloride  10 mEq Oral Daily  . simvastatin  40 mg Oral QPM  . sodium chloride  3 mL Intravenous Q12H  . spironolactone  50 mg Oral Daily   Continuous Infusions:     [facility] Pager [phone] If 8PM-8AM, please contact night-coverage at [email or web address], password [facility] 07/20/2013, 9:53 AM  LOS: 5 days

## 2013-07-20 NOTE — Progress Notes (Signed)
Primary cardiologist: Dr. Marylou Mccoy [facility])   Subjective:   Had some abdominal pain after eating, but no nausea or emesis, reports normal bowel movements.   Objective:   Temp:  [97.4 F (36.3 C)-98.1 F (36.7 C)] 97.4 F (36.3 C) (05/03 0500) Pulse Rate:  [66-79] 79 (05/03 0500) Resp:  [18-19] 19 (05/03 0500) BP: (109-135)/(62-77) 125/72 mmHg (05/03 0500) SpO2:  [99 %-100 %] 100 % (05/03 0500) Weight:  [161 lb 2.5 oz (73.1 kg)-161 lb 8 oz (73.256 kg)] 161 lb 8 oz (73.256 kg) (05/03 0500) Last BM Date: 07/18/13  Filed Weights   07/19/13 0616 07/19/13 1009 07/20/13 0500  Weight: 161 lb 9.6 oz (73.3 kg) 161 lb 2.5 oz (73.1 kg) 161 lb 8 oz (73.256 kg)    Intake/Output Summary (Last 24 hours) at 07/20/13 0850 Last data filed at 07/19/13 2125  Gross per 24 hour  Intake    720 ml  Output   2325 ml  Net  -1605 ml    Telemetry: Sinus rhythm.  Exam:  General: No distress.  Lungs: No rales.  Cardiac: RRR, no gallop.  Abdomen: NABS, no guarding.  Extremities: Chronic appearing edema, improving.   Lab Results:  Basic Metabolic Panel:  Recent Labs Lab 07/18/13 0618 07/19/13 0430 07/20/13 0530  NA 139 137 134*  K 4.2 4.1 4.5  CL 95* 87* 87*  CO2 29 33* 33*  GLUCOSE 84 108* 95  BUN 21 23 31*  CREATININE 1.29 1.51* 1.77*  CALCIUM 9.1 9.4 8.9    Liver Function Tests:  Recent Labs Lab 07/18/13 0618 07/19/13 0430 07/20/13 0530  AST 39* 33 28  ALT 20 17 14   ALKPHOS 127* 93 104  BILITOT 0.6 0.6 0.5  PROT 7.7 7.5 7.5  ALBUMIN 3.0* 2.9* 3.0*    CBC:  Recent Labs Lab 07/18/13 0618 07/19/13 0430 07/20/13 0530  WBC 6.2 6.2 5.5  HGB 8.3* 9.3* 9.7*  HCT 28.1* 30.7* 31.4*  MCV 84.4 81.9 81.1  PLT 361 403* 383    Cardiac Enzymes:  Recent Labs Lab 07/16/13 0344 07/16/13 0653 07/16/13 1253  TROPONINI <0.30 <0.30 <0.30    BNP:  Recent Labs  03/18/13 1930 07/15/13 2130 07/19/13 0430  PROBNP 2997.0* 4293.0* 981.3*     Coagulation:  Recent Labs Lab 07/15/13 2223  INR 1.24    Echocardiogram (07/16/13): Study Conclusions  - Left ventricle: The cavity size was normal. Systolic function was normal. The estimated ejection fraction was in the range of 50% to 55%. Possible mild hypokinesis of the basal-midinferolateral and inferior myocardium. Features are consistent with a pseudonormal left ventricular filling pattern, with concomitant abnormal relaxation and increased filling pressure (grade 2 diastolic dysfunction). - Left atrium: The atrium was mildly dilated. - Right ventricle: The cavity size was mildly dilated. Wall thickness was normal. Systolic function was mildly reduced. - Pulmonary arteries: Systolic pressure was moderately increased. PA peak pressure: 60mm Hg (S). Impressions:  - Comparison with 2012 study shows a similar distribution of the wall motion abnormality (current study shows interval improvement).   Medications:   Scheduled Medications: . carvedilol  3.125 mg Oral BID WC  . divalproex  500 mg Oral BID  . ferrous sulfate  325 mg Oral BID WC  . heparin subcutaneous  5,000 Units Subcutaneous 3 times per day  . isosorbide-hydrALAZINE  1 tablet Oral TID  . lisinopril  20 mg Oral BID  . mupirocin ointment  1 application Nasal BID  . pantoprazole  40  mg Oral BID  . phenytoin  200 mg Oral Daily  . phenytoin  300 mg Oral QHS  . polyethylene glycol  17 g Oral Daily  . potassium chloride  10 mEq Oral Daily  . simvastatin  40 mg Oral QPM  . sodium chloride  3 mL Intravenous Q12H  . spironolactone  50 mg Oral Daily     PRN Medications: morphine injection   Assessment:   1. Acute on chronic diastolic heart failure, LVEF 50-55%. He has lost approximately 25 pounds with diuresis overall. Creatinine continues to bump up, diuretics were held yesterday.  2. Hypertension. Recently normal range.  3. Polysubstance abuse.  4. CAD status post CABG.  5. Chronic  anemia.  6. Acute on chronic renal failure, stage III.  7. Abdominal pain, LFTs normal.   Plan/Discussion:    Will keep off Lasix today, he put out 1600 cc yesterday on just Spironolactone. Followup BMET and magnesium in a.m.   Satira Sark, M.D., F.A.C.C.

## 2013-07-21 DIAGNOSIS — G40909 Epilepsy, unspecified, not intractable, without status epilepticus: Secondary | ICD-10-CM

## 2013-07-21 LAB — CBC WITH DIFFERENTIAL/PLATELET
Basophils Absolute: 0 10*3/uL (ref 0.0–0.1)
Basophils Relative: 1 % (ref 0–1)
EOS PCT: 4 % (ref 0–5)
Eosinophils Absolute: 0.2 10*3/uL (ref 0.0–0.7)
HEMATOCRIT: 29.8 % — AB (ref 39.0–52.0)
Hemoglobin: 9.2 g/dL — ABNORMAL LOW (ref 13.0–17.0)
LYMPHS ABS: 0.8 10*3/uL (ref 0.7–4.0)
Lymphocytes Relative: 17 % (ref 12–46)
MCH: 25.2 pg — AB (ref 26.0–34.0)
MCHC: 30.9 g/dL (ref 30.0–36.0)
MCV: 81.6 fL (ref 78.0–100.0)
MONOS PCT: 23 % — AB (ref 3–12)
Monocytes Absolute: 1 10*3/uL (ref 0.1–1.0)
Neutro Abs: 2.6 10*3/uL (ref 1.7–7.7)
Neutrophils Relative %: 55 % (ref 43–77)
PLATELETS: 345 10*3/uL (ref 150–400)
RBC: 3.65 MIL/uL — AB (ref 4.22–5.81)
RDW: 18.9 % — ABNORMAL HIGH (ref 11.5–15.5)
WBC: 4.6 10*3/uL (ref 4.0–10.5)

## 2013-07-21 LAB — COMPREHENSIVE METABOLIC PANEL
ALBUMIN: 2.8 g/dL — AB (ref 3.5–5.2)
ALT: 13 U/L (ref 0–53)
AST: 27 U/L (ref 0–37)
Alkaline Phosphatase: 86 U/L (ref 39–117)
BILIRUBIN TOTAL: 0.4 mg/dL (ref 0.3–1.2)
BUN: 34 mg/dL — ABNORMAL HIGH (ref 6–23)
CHLORIDE: 91 meq/L — AB (ref 96–112)
CO2: 30 mEq/L (ref 19–32)
Calcium: 8.4 mg/dL (ref 8.4–10.5)
Creatinine, Ser: 1.78 mg/dL — ABNORMAL HIGH (ref 0.50–1.35)
GFR calc Af Amer: 43 mL/min — ABNORMAL LOW (ref >90)
GFR calc non Af Amer: 37 mL/min — ABNORMAL LOW (ref >90)
Glucose, Bld: 83 mg/dL (ref 70–99)
POTASSIUM: 4.4 meq/L (ref 3.7–5.3)
Sodium: 134 mEq/L — ABNORMAL LOW (ref 137–147)
TOTAL PROTEIN: 7 g/dL (ref 6.0–8.3)

## 2013-07-21 MED ORDER — SPIRONOLACTONE 50 MG PO TABS: 50.0000 mg | ORAL_TABLET | Freq: Every day | ORAL | Status: DC

## 2013-07-21 MED ORDER — ISOSORB DINITRATE-HYDRALAZINE 20-37.5 MG PO TABS: 1.0000 | ORAL_TABLET | Freq: Three times a day (TID) | ORAL | Status: DC

## 2013-07-21 MED ORDER — GABAPENTIN 100 MG PO CAPS: 100.0000 mg | ORAL_CAPSULE | Freq: Two times a day (BID) | ORAL | Status: DC

## 2013-07-21 MED ORDER — CARVEDILOL 3.125 MG PO TABS: 3.1250 mg | ORAL_TABLET | Freq: Two times a day (BID) | ORAL | Status: DC

## 2013-07-21 MED ORDER — FUROSEMIDE 40 MG PO TABS: 60.0000 mg | ORAL_TABLET | Freq: Every day | ORAL | Status: DC

## 2013-07-21 MED ORDER — LISINOPRIL 20 MG PO TABS: 20.0000 mg | ORAL_TABLET | Freq: Two times a day (BID) | ORAL | Status: DC

## 2013-07-21 NOTE — Progress Notes (Signed)
UR completed Reagann Dolce K. Bryona Foxworthy, RN, BSN, [place], CCM  07/21/2013 2:01 PM

## 2013-07-21 NOTE — Discharge Summary (Signed)
Physician Discharge Summary  Charles [name] WUJ:[id number] DOB: 22-Jun-1945 DOA: 07/15/2013  PCP: [name]  Admit date: 07/15/2013 Discharge date: 07/21/2013  Time spent: 35  minutes  Recommendations for Outpatient Follow-up:  1. Follow up with Hear failure clinic. 2. Titrate meds as tolerate it. BNP    Component Value Date/Time   PROBNP 981.3* 07/19/2013 0430   Filed Weights   07/19/13 1009 07/20/13 0500 07/21/13 0711  Weight: 73.1 kg (161 lb 2.5 oz) 73.256 kg (161 lb 8 oz) 73.8 kg (162 lb 11.2 oz)     Discharge Diagnoses:  Active Problems:   Chronic kidney disease (CKD), stage III (moderate)   Moderate to severe pulmonary hypertension   Acute on chronic systolic CHF (congestive heart failure)   Cocaine abuse   Leg pain, bilateral   Acute systolic congestive heart failure, NYHA class 2   Acute on chronic diastolic heart failure   Discharge Condition: stable  Diet recommendation: low sodium   History of present illness:  History of Present Illness:This is a 68 y.o. year old male with prior hx/o cocaine abuse, GI bleed, systolic CHF, CAD s/p CABG, stage 3 CKD presenting with bilateral leg pain. Patient states he's had progressive lower extremity swelling over the past one to 2 weeks. Was seen in the [place] ER within the last week for symptoms. Was given IV Lasix with improvement in swelling. Patient states the swelling has persisted since this point would having worsening pain in the lower extremities bilaterally. Swelling has seemed to have worsened going above the knees. Denies any NSAID use. Has been compliant with outpatient hypertension regimen. Denies high salt intake. Has used cocaine within the past 2 days. He states that he uses to help with pain. However, pain has seemed to worsen since using. Denies any chest pain, nausea, diaphoresis. States that he has had some intermittent black stools over the past week.  In the ER, patient hemodynamically stable.  Satting in the upper 90s at rest, though oxygen does drop to the low 90s with ambulation. Chest x-ray showed findings consistent with CHF and mild pulmonary edema. Pro BNP 4300. Hemoglobin is 7.4. Creatinine at one. Distal pulses were auscultated by Doppler but your physician. CT angiogram of femorals with runoff is pending to evaluate for claudication.   Hospital Course:  Acute systolic congestive heart failure, NYHA class 2:  - Admitted and started on IV lasix gtt and metolazone. Was hard to diurese and the advance HF team consulted  - Held lasix gtt and metolazone due to rise in Cr. His weight on admission was 84.4 ->73.8 kg. - resume lasix, spirinolaconte and KCL on 5.6.2015. - V/Q scan negative for PE  - Echo 4.29.2015 as below.   Cocaine abuse:  - counseling.   Chronic kidney disease (CKD), stage III (moderate)  - Mild Increase due to over diuresis. - recheck b-met on 5.8.2015. - Avoid NSAIDs.   Moderate to severe pulmonary hypertension:  - previous echo showed PA pr 76 mmhg. Repeated showed > 50 mmhg .  Anemia:  - Hemoglobin is 7.4 today.  - Anemia ferritin 24 cont iron pills. Check RBC folate.  - Negative Hemoccult.   Transaminitis:  - split 2:1. Due to ETOH.   Seizure disorder:  No reported seizure activity. Continue home management.    Procedures:  Echo  Consultations:  Advance heart failure team  Discharge Exam: Filed Vitals:   07/21/13 0804  BP: 169/73  Pulse: 79  Temp:   Resp: 20  General: A&O x3 Cardiovascular: RRR Respiratory: good air movement CTA B/L  Discharge Instructions      Discharge Orders   Future Orders Complete By Expires   Diet - low sodium heart healthy  As directed    Increase activity slowly  As directed        Medication List    STOP taking these medications       cloNIDine 0.2 MG tablet  Commonly known as:  CATAPRES     diltiazem 120 MG 24 hr capsule  Commonly known as:  CARDIZEM CD     hydrALAZINE 100 MG  tablet  Commonly known as:  APRESOLINE     isosorbide mononitrate 30 MG 24 hr tablet  Commonly known as:  IMDUR      TAKE these medications       amLODipine 5 MG tablet  Commonly known as:  NORVASC  Take 1 tablet (5 mg total) by mouth daily.     carvedilol 3.125 MG tablet  Commonly known as:  COREG  Take 1 tablet (3.125 mg total) by mouth 2 (two) times daily with a meal.     divalproex 500 MG DR tablet  Commonly known as:  DEPAKOTE  Take 1 tablet (500 mg total) by mouth 2 (two) times daily.     ferrous sulfate 325 (65 FE) MG tablet  Take 1 tablet (325 mg total) by mouth 2 (two) times daily with a meal.     furosemide 40 MG tablet  Commonly known as:  LASIX  Take 1.5 tablets (60 mg total) by mouth daily.  Start taking on:  07/23/2013     gabapentin 100 MG capsule  Commonly known as:  NEURONTIN  Take 1 capsule (100 mg total) by mouth 2 (two) times daily.     isosorbide-hydrALAZINE 20-37.5 MG per tablet  Commonly known as:  BIDIL  Take 1 tablet by mouth 3 (three) times daily.     lisinopril 20 MG tablet  Commonly known as:  PRINIVIL,ZESTRIL  Take 1 tablet (20 mg total) by mouth 2 (two) times daily.     mupirocin ointment 2 %  Commonly known as:  BACTROBAN  Place 1 application into the nose 2 (two) times daily.     pantoprazole 40 MG tablet  Commonly known as:  PROTONIX  Take 1 tablet (40 mg total) by mouth 2 (two) times daily.     phenytoin 100 MG ER capsule  Commonly known as:  DILANTIN  Take 2-3 capsules (200-300 mg total) by mouth 2 (two) times daily. Take 2 capsules by mouth in the morning and 3 capsules by mouth at bedtime.     potassium chloride 10 MEQ tablet  Commonly known as:  K-DUR  Take 1 tablet (10 mEq total) by mouth daily.     simvastatin 40 MG tablet  Commonly known as:  ZOCOR  Take 1 tablet (40 mg total) by mouth every evening.     spironolactone 50 MG tablet  Commonly known as:  ALDACTONE  Take 1 tablet (50 mg total) by mouth daily.  Start  taking on:  07/23/2013       Allergies  Allergen Reactions  . Motrin [Ibuprofen] Other (See Comments)    Affects kidneys  . Tylenol [Acetaminophen] Other (See Comments)    Affects kidneys   Follow-up Information   Follow up with [facility] In 1 week. (hospital follow up)    Specialty:  Cardiology   Contact information:  [address] [id number] Danice Goltz [place] [address] [phone]       The results of significant diagnostics from this hospitalization (including imaging, microbiology, ancillary and laboratory) are listed below for reference.    Significant Diagnostic Studies: Dg Chest 2 View  07/15/2013   CLINICAL DATA:  Shortness of breath and lower extremity edema at  EXAM: CHEST  2 VIEW  COMPARISON:  DG CHEST 1V PORT dated 03/21/2013  FINDINGS: The cardiopericardial silhouette remains enlarged. The pulmonary vascularity is more engorged and less distinct today than on the previous study. The pulmonary interstitial markings also are mildly increased especially on the right. There is no significant pleural effusion. The patient has undergone previous CABG.  IMPRESSION: The findings are consistent with CHF with mild pulmonary interstitial edema.   Electronically Signed   By: David  Martinique   On: 07/15/2013 23:27   Ct Angio Ao+bifem W/cm &/or Wo/cm  07/16/2013   CLINICAL DATA:  Left leg pain and swelling.  EXAM: CT ANGIOGRAPHY OF ABDOMINAL AORTA WITH ILIOFEMORAL RUNOFF  TECHNIQUE: Multidetector CT imaging of the abdomen, pelvis and lower extremities was performed using the standard protocol during bolus administration of intravenous contrast. Multiplanar CT image reconstructions and MIPs were obtained to evaluate the vascular anatomy.  CONTRAST:  18m OMNIPAQUE IOHEXOL 350 MG/ML SOLN  COMPARISON:  CT of the abdomen and pelvis from 03/24/2010, and abdominal ultrasound performed 04/18/2010  FINDINGS: Aorta: Relatively diffuse calcification is  seen along the abdominal aorta and its branches. The celiac trunk, superior mesenteric artery, bilateral renal arteries and inferior mesenteric artery remain patent, with mild calcific atherosclerotic disease seen along the superior mesenteric artery and bilateral renal arteries. There is likely moderate luminal narrowing at the proximal right common iliac artery, and severe luminal narrowing at the proximal left common iliac artery.  Calcific atherosclerotic disease is somewhat worse at the left common iliac artery, with additional moderate to severe luminal narrowing along its course. There is also moderate to severe luminal narrowing at the bifurcation of the left common iliac artery. Flow is still seen to the level of the common femoral arteries bilaterally.  Right Lower Extremity: Scattered calcific atherosclerotic disease is noted along the visualized vasculature. There is likely mild to moderate luminal narrowing involving the proximal profunda femoris. The right superficial femoral artery appears to occlude at the level of the mid thigh, though it reconstitutes more distally, just proximal to the level of the knee. This may reflect reconstitution of flow from collateral vessels, or trace nonvisualized flow along the apparently occluded segment.  Calcific atherosclerotic disease is noted along the distal popliteal artery. There is apparent three vessel runoff to the right ankle, though the posterior tibial artery is less well characterized just proximal to the level of the ankle.  Left Lower Extremity: Scattered calcific atherosclerotic disease is noted along the visualized vasculature. There is likely moderate luminal narrowing involving the proximal profunda femoris. There is apparent occlusion of the left superficial femoral artery at the level of the mid thigh, as on the right, with reconstitution noted just proximal to the level of the knee. As on the right, this may reflect reconstitution of flow from  collateral vessels, with trace nonvisualized flow along the apparently occluded segment.  There is mild scattered calcific atherosclerotic disease along the proximal branches of the left popliteal artery. Patent three vessel runoff is seen to the level of the ankle.  Additional Findings:  A trace left pleural effusion is noted.  Trace ascites is noted surrounding  the liver, tracking along the right paracolic gutter.  The liver and spleen are grossly unremarkable in appearance. The gallbladder is within normal limits. The pancreas and adrenal glands are unremarkable.  Nonspecific perinephric stranding is noted bilaterally. The kidneys are otherwise unremarkable in appearance. There is no evidence of hydronephrosis. No renal or ureteral stones are seen.  No free fluid is identified. The small bowel is unremarkable in appearance. The stomach is within normal limits. No acute vascular abnormalities are seen.  The appendix is normal in caliber and contains air, without evidence for appendicitis. The colon is unremarkable in appearance.  The bladder is mildly distended. Mild bladder wall thickening is nonspecific and may be chronic in nature. The prostate remains normal in size. No inguinal lymphadenopathy is seen.  Note is made of diffuse bilateral lower extremity edema, slightly worse on the left. This is relatively superficial in nature. No underlying focal fluid collection is seen. Edema resolves at the level of the ankles, though trace edema is noted at both feet.  No acute osseous abnormalities are identified. Multilevel vacuum phenomenon is noted at the lower lumbar spine.  Review of the MIP images confirms the above findings.  IMPRESSION: 1. Vascular findings as described above. 2. The superficial femoral arteries appear to exclude bilaterally at the level of the mid thighs, though they reconstitute more distally, just proximal to the level of the knees. This may reflect reconstitution of flow from collateral  vessels, or trace nonvisualized flow along the apparently occluded segments. 3. Calcific atherosclerotic disease is somewhat worsened at the left common iliac artery, with moderate luminal narrowing at the proximal right common iliac artery, and severe luminal narrowing at multiple locations along the left common iliac artery. 4. Following reconstitution of the popliteal arteries, there appears to be patent three vessel runoff to both lower extremities, though the right posterior tibial artery is difficult to fully characterize just proximal to the level of the ankle. 5. Diffuse bilateral lower extremity edema, slightly worse on the left. This is relatively superficial in nature. No underlying focal fluid collection seen. Edema resolves at the level of the ankles, though trace edema is seen at both feet. 6. Trace ascites noted surrounding the liver, tracking along the right paracolic gutter. 7. Trace left pleural effusion noted. 8. Mild bladder wall thickening is nonspecific and may be chronic in nature.   Electronically Signed   By: Garald Balding M.D.   On: 07/16/2013 02:46   Nm Pulmonary Perf And Vent  07/17/2013   CLINICAL DATA:  Right leg pain and swelling. Question pulmonary embolus.  EXAM: NUCLEAR MEDICINE VENTILATION - PERFUSION LUNG SCAN  TECHNIQUE: Ventilation images were obtained in multiple projections using inhaled aerosol technetium 99 M DTPA. Perfusion images were obtained in multiple projections after intravenous injection of Tc-67mMAA.  RADIOPHARMACEUTICALS:  40 mCi Tc-984mTPA aerosol and 6 mCi Tc-9942mA  COMPARISON:  PA and lateral chest 07/15/2013.  CT scan 04/08/2010.  FINDINGS: Ventilation: No focal ventilation defect.  Perfusion: No wedge shaped peripheral perfusion defects to suggest acute pulmonary embolism.  IMPRESSION: Negative exam.   Electronically Signed   By: ThoInge RiseD.   On: 07/17/2013 12:32    Microbiology: Recent Results (from the past 240 hour(s))  MRSA PCR  SCREENING     Status: Abnormal   Collection Time    07/16/13  2:37 AM      Result Value Ref Range Status   MRSA by PCR POSITIVE (*) NEGATIVE Final  Comment:            The GeneXpert MRSA Assay (FDA     approved for NASAL specimens     only), is one component of a     comprehensive MRSA colonization     surveillance program. It is not     intended to diagnose MRSA     infection nor to guide or     monitor treatment for     MRSA infections.     RESULT CALLED TO, READ BACK BY AND VERIFIED WITH:     M.ROSENBURGER RN [phone] 07/16/13 E.GADDY     Labs: Basic Metabolic Panel:  Recent Labs Lab 07/17/13 0642 07/18/13 0618 07/19/13 0430 07/20/13 0530 07/21/13 0543  NA 141 139 137 134* 134*  K 4.3 4.2 4.1 4.5 4.4  CL 103 95* 87* 87* 91*  CO2 24 29 33* 33* 30  GLUCOSE 106* 84 108* 95 83  BUN 24* 21 23 31* 34*  CREATININE 1.49* 1.29 1.51* 1.77* 1.78*  CALCIUM 8.2* 9.1 9.4 8.9 8.4   Liver Function Tests:  Recent Labs Lab 07/17/13 0642 07/18/13 0618 07/19/13 0430 07/20/13 0530 07/21/13 0543  AST 41* 39* 33 28 27  ALT 19 20 17 14 13   ALKPHOS 91 127* 93 104 86  BILITOT 0.5 0.6 0.6 0.5 0.4  PROT 6.6 7.7 7.5 7.5 7.0  ALBUMIN 2.5* 3.0* 2.9* 3.0* 2.8*   No results found for this basename: LIPASE, AMYLASE,  in the last 168 hours No results found for this basename: AMMONIA,  in the last 168 hours CBC:  Recent Labs Lab 07/17/13 0642 07/18/13 0618 07/19/13 0430 07/20/13 0530 07/21/13 0543  WBC 5.3 6.2 6.2 5.5 4.6  NEUTROABS 3.6 4.3 4.1 3.6 2.6  HGB 7.2* 8.3* 9.3* 9.7* 9.2*  HCT 24.1* 28.1* 30.7* 31.4* 29.8*  MCV 84.0 84.4 81.9 81.1 81.6  PLT 309 361 403* 383 345   Cardiac Enzymes:  Recent Labs Lab 07/16/13 0344 07/16/13 0653 07/16/13 1253  TROPONINI <0.30 <0.30 <0.30   BNP: BNP (last 3 results)  Recent Labs  03/18/13 1930 07/15/13 2130 07/19/13 0430  PROBNP 2997.0* 4293.0* 981.3*   CBG: No results found for this basename: GLUCAP,  in the last 168  hours     Signed:  [facility] 07/21/2013, 9:57 AM

## 2013-07-21 NOTE — Progress Notes (Signed)
Patient seen and examined with Junie Bame, NP. We discussed all aspects of the encounter. I agree with the assessment and plan as stated above. I have edited the note with my changes as needed.  Shaune Pascal Bensimhon,MD 11:17 PM

## 2013-07-21 NOTE — Progress Notes (Signed)
Advanced Heart Failure Rounding Note  Referring Physician: Dr. Olevia Bowens  Primary Physician: [name]  Primary Cardiologist: Dr. Marylou Mccoy [facility])  Subjective:    Charles [name] is a 68 y.o. year old male with prior hx/o cocaine abuse, GI bleed, systolic CHF, CAD s/p CABG 2009, and stage 3 CKD. [name]   ECHO EF 50-55% mild RV dysfunction grade 2 DD  CTA (4/29) LEs showed significant PAD and LE edema. Received 100cc contrast   Down 24 pounds. Lasix and metolazone on hold due to renal failure. Cr now plateauing.    Objective:   Weight Range:  Vital Signs:   Temp:  [98.1 F (36.7 C)-98.9 F (37.2 C)] 98.1 F (36.7 C) (05/04 0711) Pulse Rate:  [69-82] 79 (05/04 0804) Resp:  [18-20] 20 (05/04 0804) BP: (95-169)/(65-78) 169/73 mmHg (05/04 0804) SpO2:  [96 %-99 %] 99 % (05/04 0804) Weight:  [73.8 kg (162 lb 11.2 oz)] 73.8 kg (162 lb 11.2 oz) (05/04 0711) Last BM Date: 07/18/13  Weight change: Filed Weights   07/19/13 1009 07/20/13 0500 07/21/13 0711  Weight: 73.1 kg (161 lb 2.5 oz) 73.256 kg (161 lb 8 oz) 73.8 kg (162 lb 11.2 oz)    Intake/Output:   Intake/Output Summary (Last 24 hours) at 07/21/13 0836 Last data filed at 07/21/13 0715  Gross per 24 hour  Intake    600 ml  Output   1275 ml  Net   -675 ml     Physical Exam: General: Sitting up eating breakfast. No resp difficulty; sitting up in chair  HEENT: normal  Neck: supple. JVP 7-8 . Carotids 2+ bilat; no bruits. No lymphadenopathy or thryomegaly appreciated.  Cor: PMI nondisplaced. Regular rate & rhythm. No rubs, gallops or murmurs. Lungs: clear  Abdomen: soft, nontender, nondistended. No hepatosplenomegaly. No bruits or masses. Good bowel sounds.  Extremities: no cyanosis, clubbing, rash, tr bilateral LE woody edema, tender to light touch  Neuro: alert & orientedx3, cranial nerves grossly intact. moves all 4 extremities w/o difficulty. Affect pleasant  Telemetry: SR 60-70s  Labs: Basic Metabolic Panel:  Recent  Labs Lab 07/17/13 0642 07/18/13 0618 07/19/13 0430 07/20/13 0530 07/21/13 0543  NA 141 139 137 134* 134*  K 4.3 4.2 4.1 4.5 4.4  CL 103 95* 87* 87* 91*  CO2 24 29 33* 33* 30  GLUCOSE 106* 84 108* 95 83  BUN 24* 21 23 31* 34*  CREATININE 1.49* 1.29 1.51* 1.77* 1.78*  CALCIUM 8.2* 9.1 9.4 8.9 8.4    Liver Function Tests:  Recent Labs Lab 07/17/13 0642 07/18/13 0618 07/19/13 0430 07/20/13 0530 07/21/13 0543  AST 41* 39* 33 28 27  ALT 19 20 17 14 13   ALKPHOS 91 127* 93 104 86  BILITOT 0.5 0.6 0.6 0.5 0.4  PROT 6.6 7.7 7.5 7.5 7.0  ALBUMIN 2.5* 3.0* 2.9* 3.0* 2.8*   No results found for this basename: LIPASE, AMYLASE,  in the last 168 hours No results found for this basename: AMMONIA,  in the last 168 hours  CBC:  Recent Labs Lab 07/17/13 0642 07/18/13 0618 07/19/13 0430 07/20/13 0530 07/21/13 0543  WBC 5.3 6.2 6.2 5.5 4.6  NEUTROABS 3.6 4.3 4.1 3.6 2.6  HGB 7.2* 8.3* 9.3* 9.7* 9.2*  HCT 24.1* 28.1* 30.7* 31.4* 29.8*  MCV 84.0 84.4 81.9 81.1 81.6  PLT 309 361 403* 383 345    Cardiac Enzymes:  Recent Labs Lab 07/16/13 0344 07/16/13 0653 07/16/13 1253  TROPONINI <0.30 <0.30 <0.30    BNP: BNP (  last 3 results)  Recent Labs  03/18/13 1930 07/15/13 2130 07/19/13 0430  PROBNP 2997.0* 4293.0* 981.3*     Other results:    Imaging: No results found.   Medications:     Scheduled Medications: . carvedilol  3.125 mg Oral BID WC  . divalproex  500 mg Oral BID  . ferrous sulfate  325 mg Oral BID WC  . gabapentin  100 mg Oral BID  . heparin subcutaneous  5,000 Units Subcutaneous 3 times per day  . isosorbide-hydrALAZINE  1 tablet Oral TID  . lisinopril  20 mg Oral BID  . mupirocin ointment  1 application Nasal BID  . pantoprazole  40 mg Oral BID  . phenytoin  200 mg Oral Daily  . phenytoin  300 mg Oral QHS  . polyethylene glycol  17 g Oral Daily  . simvastatin  40 mg Oral QPM  . sodium chloride  3 mL Intravenous Q12H  . spironolactone   50 mg Oral Daily    Infusions:    PRN Medications: morphine injection, oxyCODONE   Assessment:   1) A/C diastolic heart failure  2) HTN  3) Polysubstance abuse  4) CAD  - s/p CABG  5) chronic anemia  6) Acute renal failure stage III   Plan/Discussion:    Volume status looks much better. Renal function plateauing. Would continue to hold lasix today. Can probably come home today.   Continue current meds - would send home on lasix 60 daily. He does not have a scale. We will provide one and do some HF teaching prior to d/c. Reinforced need for daily weights and reviewed use of sliding scale diuretics.  Will arrange f/u in HF clinic on Friday or next Monday.   Shaune Pascal Bensimhon,MD 8:49 AM

## 2013-07-21 NOTE — Care Management Note (Addendum)
  Page 1 of 1   07/21/2013     2:00:33 PM CARE MANAGEMENT NOTE 07/21/2013  Patient:  Charles [name], Charles [name]   Account Number:  [id number]  Date Initiated:  07/21/2013  Documentation initiated by:  Mariann Laster  Subjective/Objective Assessment:   Admitted with Leg pain     Action/Plan:   CM to follow for disposition needs   Anticipated DC Date:  07/21/2013   Anticipated DC Plan:  [place]  CM consult  Medication Assistance      Choice offered to / List presented to:             Status of service:  Completed, signed off Medicare Important Message given?   (If response is "NO", the following Medicare IM given date fields will be blank) Date Medicare IM given:  07/16/2013 Date Additional Medicare IM given:    Discharge Disposition:  HOME/SELF CARE  Per UR Regulation:  Reviewed for med. necessity/level of care/duration of stay  If discussed at [place] of Stay Meetings, dates discussed:    Comments:  CM Consult:  medication coverage for isosorbide-hydrALAZINE (BIDIL) 20-37.5 MG per tablet Coverage:  MCD On MCD List Dispositon:  Home / self care.

## 2013-07-21 NOTE — Consult Note (Signed)
Heart Failure Navigator Consult Note  Presentation: Charles [name]  is a 68 y.o. year old male with prior hx/o cocaine abuse, GI bleed, systolic CHF, CAD s/p CABG, stage 3 CKD presenting with bilateral leg pain. Patient states he's had progressive lower extremity swelling over the past one to 2 weeks. Was seen in the [place] ER within the last week for symptoms. Was given IV Lasix with improvement in swelling. Patient states the swelling has persisted since this point would having worsening pain in the lower extremities bilaterally. Swelling has seemed to have worsened going above the knees. Denies any NSAID use. Has been compliant with outpatient hypertension regimen. Denies high salt intake. Has used cocaine within the past 2 days. He states that he uses to help with pain. However, pain has seemed to worsen since using. Denies any chest pain, nausea, diaphoresis. States that he has had some intermittent black stools over the past week    Past Medical History  Diagnosis Date  . Iron deficiency anemia   . Hypertension   . Chronic systolic heart failure     2D Echo (03/2010) - EF 35-40% with akinesis of inferoposterior myocardium  . CAD (coronary artery disease)     s/p 3-vessel CABG (12/2007) // 100% RCA stenosis with collaterals from left system. Severe bifurcation lesions of proxima CXA and OM. Moderate LAD diseaseFollow[name]  . Hyperlipidemia LDL goal <70   . Obstructive sleep apnea     not on home CPAP  . Ischemic cardiomyopathy   . Chronic kidney disease (CKD), stage III (moderate)     BL SCr 1.5-1.6  . Homelessness   . 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 // Colonoscopy and EGD in 03/2010 negative per report (record not found)  . 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 Polysubstance abuse    cocaine, THC  . AV malformation of gastrointestinal tract   . Family history of early CAD   . DDD (degenerative disc disease), lumbar   . Renal artery stenosis   . Peripheral vascular disease   . Seizure disorder   . Diabetes   . Acute on chronic systolic CHF (congestive heart failure) 08/13/2012  . Myocardial infarction   . Seizures     History   Social History  . Marital Status: Single    Spouse Name: N/A    Number of Children: 2  . Years of Education: N/A   Occupational History  . Unemployed     previously worked in [employer]  .     Social History Main Topics  . Smoking status: Current Every Day Smoker -- 0.20 packs/day for 13 years    Types: Cigarettes    Last Attempt to Quit: 03/20/1997  . Smokeless tobacco: Never Used  . Alcohol Use: Yes     Comment: occasionally drinks, a few times a month  . Drug Use: 1.00 per week    Special: Cocaine, Marijuana  . Sexual Activity: No   Other Topics Concern  . None   Social History Narrative   Lives in [place]. Originally from [place], has been in the [place] since 1980s                   ECHO:Study Conclusions-07/16/13  - Left ventricle: The  cavity size was normal. Systolic function was normal. The estimated ejection fraction was in the range of 50% to 55%. Possible mild hypokinesis of the basal-midinferolateral and inferior myocardium. Features are consistent with a pseudonormal left ventricular filling pattern, with concomitant abnormal relaxation and increased filling pressure (grade 2 diastolic dysfunction). - Left atrium: The atrium was mildly dilated. - Right ventricle: The cavity size was mildly dilated. Wall thickness was normal. Systolic function was mildly reduced. - Pulmonary arteries: Systolic pressure was moderately increased. PA peak pressure: 40mm Hg (S). Impressions:  - Comparison with 2012 study shows a similar distribution of the wall motion abnormality (current study shows  interval improvement).   BNP    Component Value Date/Time   PROBNP 981.3* 07/19/2013 0430    Education Assessment and Provision:  Detailed education and instructions provided on heart failure disease management including the following:  Signs and symptoms of Heart Failure When to call the physician Importance of daily weights Low sodium diet  Fluid restriction Medication management Anticipated future follow-up appointments  Patient education given on each of the above topics.  Charles [name] was very distracted in that he was waiting for the nurse to bring pain medication and was having pain.  I discussed with him outpatient follow-up with the AHF clinic.  He asked several times why can't this heart doctor "just give his information to my cardiologist in [facility]"?  He says that he will need to take 3 buses to get to his appointment in [place].  I explained that we only want him to have the best care available and recommend that he comes to his appt with the AHF clinic.  Dr. Olevia [name] reinforced the need for him to come to [facility] for follow-up care with the AHF clinic and he agreed.  He says that he will have "no issue" getting his medications at discharge.  I have given him a scale and explained the importance of daily weights.  He acknowledges understanding.  He did not seem very open to further HF education and therefore I ended our conversation.    Education Materials:  "Living Better With Heart Failure" Booklet, Daily Weight Tracker Tool and Heart Failure Educational Video.   High Risk Criteria for Readmission and/or Poor Patient Outcomes:  (Recommend Follow-up with Advanced Heart Failure Clinic)-Yes   EF <30%- No--50-55%-- (Grade 2 --diastolic dysfunction)  2 or more admissions in 6 months- Yes  Difficult social situation- Yes--unsure of exact living situation--says he lives with daughter  Demonstrates medication noncompliance- No    Barriers of Care:  Knowledge of HF,  ability/desire to be compliant, social/living situation  Discharge Planning:   Plans to discharge to home with daughter in [place].  He will need HHRN and HHPT if he  qualifies.  He will also need ongoing education as well compliance reinforcement.

## 2013-07-23 ENCOUNTER — Telehealth: Payer: Self-pay | Admitting: Licensed Clinical Social Worker

## 2013-07-23 NOTE — Telephone Encounter (Signed)
CSW received referral to assess patient for needs on post hospital visit 07/28/13. CSW attempted to contact patient to follow up on transport needs to clinic for appointment. CSW unable to reach or leave message due to full mailbox. CSW will attempt to follow up on clinic visit. Raquel Sarna, [place]

## 2013-07-28 ENCOUNTER — Inpatient Hospital Stay (HOSPITAL_COMMUNITY): Admit: 2013-07-28 | Payer: Medicare Other

## 2013-07-29 ENCOUNTER — Telehealth: Payer: Self-pay | Admitting: Licensed Clinical Social Worker

## 2013-07-29 NOTE — Telephone Encounter (Signed)
CSW received request to assist patient with transportation to clinic appointment. CSW left message at [employer] for return call. CSW again attempted to reach patient with no success and unable to leave message. Will continue to attempt contact with patient for transport needs. Raquel Sarna, [place]

## 2013-07-31 ENCOUNTER — Inpatient Hospital Stay (HOSPITAL_COMMUNITY): Admission: RE | Admit: 2013-07-31 | Payer: Medicare Other | Source: Ambulatory Visit

## 2013-07-31 ENCOUNTER — Encounter (HOSPITAL_COMMUNITY): Payer: Self-pay

## 2013-08-02 ENCOUNTER — Encounter (HOSPITAL_COMMUNITY): Payer: Self-pay | Admitting: Emergency Medicine

## 2013-08-02 ENCOUNTER — Inpatient Hospital Stay (HOSPITAL_COMMUNITY)
Admission: EM | Admit: 2013-08-02 | Discharge: 2013-08-04 | DRG: 378 | Disposition: A | Discharge status: Home Health | Payer: Medicare Other | Attending: Internal Medicine | Admitting: Internal Medicine

## 2013-08-02 ENCOUNTER — Observation Stay (HOSPITAL_COMMUNITY): Payer: Medicare Other

## 2013-08-02 DIAGNOSIS — D649 Anemia, unspecified: Secondary | ICD-10-CM

## 2013-08-02 DIAGNOSIS — M79604 Pain in right leg: Secondary | ICD-10-CM | POA: Diagnosis present

## 2013-08-02 DIAGNOSIS — E785 Hyperlipidemia, unspecified: Secondary | ICD-10-CM | POA: Diagnosis present

## 2013-08-02 DIAGNOSIS — K31819 Angiodysplasia of stomach and duodenum without bleeding: Secondary | ICD-10-CM

## 2013-08-02 DIAGNOSIS — I200 Unstable angina: Secondary | ICD-10-CM

## 2013-08-02 DIAGNOSIS — I5022 Chronic systolic (congestive) heart failure: Secondary | ICD-10-CM

## 2013-08-02 DIAGNOSIS — R569 Unspecified convulsions: Secondary | ICD-10-CM

## 2013-08-02 DIAGNOSIS — I2510 Atherosclerotic heart disease of native coronary artery without angina pectoris: Secondary | ICD-10-CM

## 2013-08-02 DIAGNOSIS — N183 Chronic kidney disease, stage 3 unspecified: Secondary | ICD-10-CM

## 2013-08-02 DIAGNOSIS — I701 Atherosclerosis of renal artery: Secondary | ICD-10-CM

## 2013-08-02 DIAGNOSIS — G4733 Obstructive sleep apnea (adult) (pediatric): Secondary | ICD-10-CM | POA: Diagnosis present

## 2013-08-02 DIAGNOSIS — I739 Peripheral vascular disease, unspecified: Secondary | ICD-10-CM

## 2013-08-02 DIAGNOSIS — R0789 Other chest pain: Secondary | ICD-10-CM

## 2013-08-02 DIAGNOSIS — K31811 Angiodysplasia of stomach and duodenum with bleeding: Principal | ICD-10-CM | POA: Diagnosis present

## 2013-08-02 DIAGNOSIS — F1910 Other psychoactive substance abuse, uncomplicated: Secondary | ICD-10-CM | POA: Diagnosis present

## 2013-08-02 DIAGNOSIS — M79605 Pain in left leg: Secondary | ICD-10-CM

## 2013-08-02 DIAGNOSIS — I255 Ischemic cardiomyopathy: Secondary | ICD-10-CM

## 2013-08-02 DIAGNOSIS — I272 Pulmonary hypertension, unspecified: Secondary | ICD-10-CM | POA: Diagnosis present

## 2013-08-02 DIAGNOSIS — M5136 Other intervertebral disc degeneration, lumbar region: Secondary | ICD-10-CM

## 2013-08-02 DIAGNOSIS — R001 Bradycardia, unspecified: Secondary | ICD-10-CM

## 2013-08-02 DIAGNOSIS — I10 Essential (primary) hypertension: Secondary | ICD-10-CM

## 2013-08-02 DIAGNOSIS — Z951 Presence of aortocoronary bypass graft: Secondary | ICD-10-CM

## 2013-08-02 DIAGNOSIS — I5023 Acute on chronic systolic (congestive) heart failure: Secondary | ICD-10-CM

## 2013-08-02 DIAGNOSIS — E119 Type 2 diabetes mellitus without complications: Secondary | ICD-10-CM

## 2013-08-02 DIAGNOSIS — Z7251 High risk heterosexual behavior: Secondary | ICD-10-CM

## 2013-08-02 DIAGNOSIS — K922 Gastrointestinal hemorrhage, unspecified: Secondary | ICD-10-CM

## 2013-08-02 DIAGNOSIS — I2789 Other specified pulmonary heart diseases: Secondary | ICD-10-CM | POA: Diagnosis present

## 2013-08-02 DIAGNOSIS — G8929 Other chronic pain: Secondary | ICD-10-CM | POA: Diagnosis present

## 2013-08-02 DIAGNOSIS — I252 Old myocardial infarction: Secondary | ICD-10-CM

## 2013-08-02 DIAGNOSIS — I5021 Acute systolic (congestive) heart failure: Secondary | ICD-10-CM

## 2013-08-02 DIAGNOSIS — F1410 Cocaine abuse, uncomplicated: Secondary | ICD-10-CM

## 2013-08-02 DIAGNOSIS — G40909 Epilepsy, unspecified, not intractable, without status epilepticus: Secondary | ICD-10-CM

## 2013-08-02 DIAGNOSIS — F17200 Nicotine dependence, unspecified, uncomplicated: Secondary | ICD-10-CM | POA: Diagnosis present

## 2013-08-02 DIAGNOSIS — I5042 Chronic combined systolic (congestive) and diastolic (congestive) heart failure: Secondary | ICD-10-CM | POA: Diagnosis present

## 2013-08-02 DIAGNOSIS — I214 Non-ST elevation (NSTEMI) myocardial infarction: Secondary | ICD-10-CM

## 2013-08-02 DIAGNOSIS — Z8249 Family history of ischemic heart disease and other diseases of the circulatory system: Secondary | ICD-10-CM

## 2013-08-02 DIAGNOSIS — I129 Hypertensive chronic kidney disease with stage 1 through stage 4 chronic kidney disease, or unspecified chronic kidney disease: Secondary | ICD-10-CM | POA: Diagnosis present

## 2013-08-02 DIAGNOSIS — D509 Iron deficiency anemia, unspecified: Secondary | ICD-10-CM

## 2013-08-02 DIAGNOSIS — M51369 Other intervertebral disc degeneration, lumbar region without mention of lumbar back pain or lower extremity pain: Secondary | ICD-10-CM

## 2013-08-02 DIAGNOSIS — I5033 Acute on chronic diastolic (congestive) heart failure: Secondary | ICD-10-CM

## 2013-08-02 DIAGNOSIS — Z5900 Homelessness unspecified: Secondary | ICD-10-CM

## 2013-08-02 DIAGNOSIS — I2589 Other forms of chronic ischemic heart disease: Secondary | ICD-10-CM | POA: Diagnosis present

## 2013-08-02 DIAGNOSIS — R079 Chest pain, unspecified: Secondary | ICD-10-CM

## 2013-08-02 LAB — HEMOGLOBIN AND HEMATOCRIT, BLOOD
HEMATOCRIT: 26.5 % — AB (ref 39.0–52.0)
Hemoglobin: 8.4 g/dL — ABNORMAL LOW (ref 13.0–17.0)

## 2013-08-02 LAB — COMPREHENSIVE METABOLIC PANEL
ALT: 33 U/L (ref 0–53)
AST: 77 U/L — ABNORMAL HIGH (ref 0–37)
Albumin: 2.8 g/dL — ABNORMAL LOW (ref 3.5–5.2)
Alkaline Phosphatase: 102 U/L (ref 39–117)
BUN: 24 mg/dL — ABNORMAL HIGH (ref 6–23)
CALCIUM: 8.3 mg/dL — AB (ref 8.4–10.5)
CHLORIDE: 104 meq/L (ref 96–112)
CO2: 23 meq/L (ref 19–32)
Creatinine, Ser: 1.16 mg/dL (ref 0.50–1.35)
GFR calc Af Amer: 72 mL/min — ABNORMAL LOW (ref >90)
GFR, EST NON AFRICAN AMERICAN: 62 mL/min — AB (ref >90)
Glucose, Bld: 93 mg/dL (ref 70–99)
Potassium: 4.5 mEq/L (ref 3.7–5.3)
SODIUM: 139 meq/L (ref 137–147)
Total Bilirubin: 0.3 mg/dL (ref 0.3–1.2)
Total Protein: 6.9 g/dL (ref 6.0–8.3)

## 2013-08-02 LAB — RAPID URINE DRUG SCREEN, HOSP PERFORMED
Amphetamines: NOT DETECTED
Barbiturates: NOT DETECTED
Benzodiazepines: NOT DETECTED
COCAINE: POSITIVE — AB
OPIATES: NOT DETECTED
Tetrahydrocannabinol: NOT DETECTED

## 2013-08-02 LAB — PROTIME-INR
INR: 1.06 (ref 0.00–1.49)
Prothrombin Time: 13.6 seconds (ref 11.6–15.2)

## 2013-08-02 LAB — IRON AND TIBC
Iron: 16 ug/dL — ABNORMAL LOW (ref 42–135)
Saturation Ratios: 4 % — ABNORMAL LOW (ref 20–55)
TIBC: 382 ug/dL (ref 215–435)
UIBC: 366 ug/dL (ref 125–400)

## 2013-08-02 LAB — TROPONIN I
Troponin I: <0.3 ng/mL (ref <0.30)
Troponin I: <0.3 ng/mL (ref <0.30)
Troponin I: <0.3 ng/mL (ref <0.30)

## 2013-08-02 LAB — CBC WITH DIFFERENTIAL/PLATELET
Basophils Absolute: 0.1 10*3/uL (ref 0.0–0.1)
Basophils Relative: 1 % (ref 0–1)
EOS PCT: 4 % (ref 0–5)
Eosinophils Absolute: 0.2 10*3/uL (ref 0.0–0.7)
HCT: 21.7 % — ABNORMAL LOW (ref 39.0–52.0)
Hemoglobin: 6.6 g/dL — CL (ref 13.0–17.0)
Lymphocytes Relative: 19 % (ref 12–46)
Lymphs Abs: 1 10*3/uL (ref 0.7–4.0)
MCH: 25.6 pg — ABNORMAL LOW (ref 26.0–34.0)
MCHC: 30.4 g/dL (ref 30.0–36.0)
MCV: 84.1 fL (ref 78.0–100.0)
MONO ABS: 0.5 10*3/uL (ref 0.1–1.0)
MONOS PCT: 9 % (ref 3–12)
NEUTROS PCT: 67 % (ref 43–77)
Neutro Abs: 3.2 10*3/uL (ref 1.7–7.7)
PLATELETS: 386 10*3/uL (ref 150–400)
RBC: 2.58 MIL/uL — AB (ref 4.22–5.81)
RDW: 20.2 % — ABNORMAL HIGH (ref 11.5–15.5)
WBC: 5 10*3/uL (ref 4.0–10.5)

## 2013-08-02 LAB — PRO B NATRIURETIC PEPTIDE: Pro B Natriuretic peptide (BNP): 1930 pg/mL — ABNORMAL HIGH (ref 0–125)

## 2013-08-02 LAB — FOLATE: Folate: 19.3 ng/mL

## 2013-08-02 LAB — POC OCCULT BLOOD, ED: Fecal Occult Bld: POSITIVE — AB

## 2013-08-02 LAB — RETICULOCYTES
RBC.: 2.66 MIL/uL — AB (ref 4.22–5.81)
RETIC COUNT ABSOLUTE: 58.5 10*3/uL (ref 19.0–186.0)
RETIC CT PCT: 2.2 % (ref 0.4–3.1)

## 2013-08-02 LAB — FERRITIN: Ferritin: 34 ng/mL (ref 22–322)

## 2013-08-02 LAB — VITAMIN B12: VITAMIN B 12: 430 pg/mL (ref 211–911)

## 2013-08-02 LAB — PREPARE RBC (CROSSMATCH)

## 2013-08-02 LAB — APTT: APTT: 33 s (ref 24–37)

## 2013-08-02 MED ORDER — FUROSEMIDE 10 MG/ML IJ SOLN
40.0000 mg | Freq: Once | INTRAMUSCULAR | Status: AC
  Administered 2013-08-02: 40 mg via INTRAVENOUS
  Filled 2013-08-02: qty 4

## 2013-08-02 MED ORDER — SODIUM CHLORIDE 0.9 % IJ SOLN: 3.0000 mL | INTRAMUSCULAR | Status: DC | PRN

## 2013-08-02 MED ORDER — PANTOPRAZOLE SODIUM 40 MG PO TBEC: 40.0000 mg | DELAYED_RELEASE_TABLET | Freq: Two times a day (BID) | ORAL | Status: DC

## 2013-08-02 MED ORDER — ATORVASTATIN CALCIUM 20 MG PO TABS
20.0000 mg | ORAL_TABLET | Freq: Every day | ORAL | Status: DC
  Administered 2013-08-02 – 2013-08-03 (×2): 20 mg via ORAL
  Filled 2013-08-02 (×4): qty 1

## 2013-08-02 MED ORDER — PHENYTOIN SODIUM EXTENDED 100 MG PO CAPS: 200.0000 mg | ORAL_CAPSULE | Freq: Two times a day (BID) | ORAL | Status: DC

## 2013-08-02 MED ORDER — LISINOPRIL 20 MG PO TABS
20.0000 mg | ORAL_TABLET | Freq: Two times a day (BID) | ORAL | Status: DC
  Administered 2013-08-02 – 2013-08-04 (×5): 20 mg via ORAL
  Filled 2013-08-02 (×6): qty 1

## 2013-08-02 MED ORDER — POLYETHYLENE GLYCOL 3350 17 G PO PACK
17.0000 g | PACK | Freq: Every day | ORAL | Status: DC | PRN
  Filled 2013-08-02: qty 1

## 2013-08-02 MED ORDER — DIPHENHYDRAMINE HCL 50 MG/ML IJ SOLN
25.0000 mg | Freq: Four times a day (QID) | INTRAMUSCULAR | Status: DC | PRN
  Administered 2013-08-02 – 2013-08-04 (×3): 25 mg via INTRAVENOUS
  Filled 2013-08-02 (×3): qty 1

## 2013-08-02 MED ORDER — DIVALPROEX SODIUM 500 MG PO DR TAB
500.0000 mg | DELAYED_RELEASE_TABLET | Freq: Two times a day (BID) | ORAL | Status: DC
  Administered 2013-08-02 – 2013-08-04 (×5): 500 mg via ORAL
  Filled 2013-08-02 (×6): qty 1

## 2013-08-02 MED ORDER — SODIUM CHLORIDE 0.9 % IV SOLN: 250.0000 mL | INTRAVENOUS | Status: DC | PRN

## 2013-08-02 MED ORDER — MAGNESIUM SULFATE IN D5W 10-5 MG/ML-% IV SOLN
1.0000 g | Freq: Once | INTRAVENOUS | Status: AC
  Administered 2013-08-02: 1 g via INTRAVENOUS
  Filled 2013-08-02: qty 100

## 2013-08-02 MED ORDER — PHENYTOIN SODIUM EXTENDED 100 MG PO CAPS
200.0000 mg | ORAL_CAPSULE | Freq: Every day | ORAL | Status: DC
  Administered 2013-08-02 – 2013-08-04 (×3): 200 mg via ORAL
  Filled 2013-08-02 (×3): qty 2

## 2013-08-02 MED ORDER — POTASSIUM CHLORIDE ER 10 MEQ PO TBCR
10.0000 meq | EXTENDED_RELEASE_TABLET | Freq: Every day | ORAL | Status: DC
  Administered 2013-08-02 – 2013-08-04 (×3): 10 meq via ORAL
  Filled 2013-08-02 (×3): qty 1

## 2013-08-02 MED ORDER — PHENYTOIN SODIUM EXTENDED 100 MG PO CAPS
300.0000 mg | ORAL_CAPSULE | Freq: Every day | ORAL | Status: DC
  Administered 2013-08-02 – 2013-08-03 (×2): 300 mg via ORAL
  Filled 2013-08-02 (×3): qty 3

## 2013-08-02 MED ORDER — HYDROCODONE-ACETAMINOPHEN 5-325 MG PO TABS: 1.0000 | ORAL_TABLET | ORAL | Status: DC | PRN

## 2013-08-02 MED ORDER — PANTOPRAZOLE SODIUM 40 MG IV SOLR
40.0000 mg | Freq: Two times a day (BID) | INTRAVENOUS | Status: DC
  Administered 2013-08-02 – 2013-08-04 (×5): 40 mg via INTRAVENOUS
  Filled 2013-08-02 (×6): qty 40

## 2013-08-02 MED ORDER — SPIRONOLACTONE 50 MG PO TABS
50.0000 mg | ORAL_TABLET | Freq: Every day | ORAL | Status: DC
  Administered 2013-08-02 – 2013-08-04 (×3): 50 mg via ORAL
  Filled 2013-08-02 (×3): qty 1

## 2013-08-02 MED ORDER — ONDANSETRON HCL 4 MG PO TABS: 4.0000 mg | ORAL_TABLET | Freq: Four times a day (QID) | ORAL | Status: DC | PRN

## 2013-08-02 MED ORDER — OXYCODONE HCL 5 MG PO TABS
5.0000 mg | ORAL_TABLET | Freq: Three times a day (TID) | ORAL | Status: DC | PRN
  Administered 2013-08-02 – 2013-08-04 (×4): 5 mg via ORAL
  Filled 2013-08-02 (×4): qty 1

## 2013-08-02 MED ORDER — GABAPENTIN 100 MG PO CAPS
100.0000 mg | ORAL_CAPSULE | Freq: Two times a day (BID) | ORAL | Status: DC
  Administered 2013-08-02 – 2013-08-04 (×5): 100 mg via ORAL
  Filled 2013-08-02 (×6): qty 1

## 2013-08-02 MED ORDER — MUPIROCIN 2 % EX OINT
1.0000 "application " | TOPICAL_OINTMENT | Freq: Two times a day (BID) | CUTANEOUS | Status: DC
  Administered 2013-08-02 – 2013-08-03 (×4): 1 via NASAL
  Filled 2013-08-02 (×2): qty 22

## 2013-08-02 MED ORDER — SIMVASTATIN 40 MG PO TABS
40.0000 mg | ORAL_TABLET | Freq: Every evening | ORAL | Status: DC
  Filled 2013-08-02: qty 1

## 2013-08-02 MED ORDER — AMLODIPINE BESYLATE 5 MG PO TABS
5.0000 mg | ORAL_TABLET | Freq: Every day | ORAL | Status: DC
  Administered 2013-08-02 – 2013-08-03 (×2): 5 mg via ORAL
  Filled 2013-08-02 (×3): qty 1

## 2013-08-02 MED ORDER — TRAMADOL HCL 50 MG PO TABS
50.0000 mg | ORAL_TABLET | Freq: Once | ORAL | Status: AC
  Administered 2013-08-02: 50 mg via ORAL
  Filled 2013-08-02: qty 1

## 2013-08-02 MED ORDER — ISOSORB DINITRATE-HYDRALAZINE 20-37.5 MG PO TABS
1.0000 | ORAL_TABLET | Freq: Three times a day (TID) | ORAL | Status: DC
  Administered 2013-08-02 – 2013-08-04 (×6): 1 via ORAL
  Filled 2013-08-02 (×8): qty 1

## 2013-08-02 MED ORDER — SODIUM CHLORIDE 0.9 % IJ SOLN
3.0000 mL | Freq: Two times a day (BID) | INTRAMUSCULAR | Status: DC
  Administered 2013-08-04: 3 mL via INTRAVENOUS

## 2013-08-02 MED ORDER — ONDANSETRON HCL 4 MG/2ML IJ SOLN: 4.0000 mg | Freq: Four times a day (QID) | INTRAMUSCULAR | Status: DC | PRN

## 2013-08-02 MED ORDER — FUROSEMIDE 10 MG/ML IJ SOLN: 20.0000 mg | Freq: Once | INTRAMUSCULAR | Status: DC

## 2013-08-02 MED ORDER — FERROUS SULFATE 325 (65 FE) MG PO TABS
325.0000 mg | ORAL_TABLET | Freq: Two times a day (BID) | ORAL | Status: DC
  Administered 2013-08-02 – 2013-08-04 (×4): 325 mg via ORAL
  Filled 2013-08-02 (×6): qty 1

## 2013-08-02 MED ORDER — SODIUM CHLORIDE 0.9 % IJ SOLN
3.0000 mL | Freq: Two times a day (BID) | INTRAMUSCULAR | Status: DC
  Administered 2013-08-02 – 2013-08-03 (×3): 3 mL via INTRAVENOUS

## 2013-08-02 MED ORDER — CARVEDILOL 3.125 MG PO TABS
3.1250 mg | ORAL_TABLET | Freq: Two times a day (BID) | ORAL | Status: DC
  Administered 2013-08-02 – 2013-08-04 (×4): 3.125 mg via ORAL
  Filled 2013-08-02 (×6): qty 1

## 2013-08-02 MED ORDER — GUAIFENESIN-DM 100-10 MG/5ML PO SYRP
5.0000 mL | ORAL_SOLUTION | ORAL | Status: DC | PRN
  Filled 2013-08-02: qty 5

## 2013-08-02 MED ORDER — FUROSEMIDE 40 MG PO TABS
60.0000 mg | ORAL_TABLET | Freq: Every day | ORAL | Status: DC
  Administered 2013-08-02 – 2013-08-04 (×3): 60 mg via ORAL
  Filled 2013-08-02 (×3): qty 1

## 2013-08-02 NOTE — ED Notes (Signed)
The pt arrived by [place] from [place] where he lives.  He requested to come here.  Lt sided chest pain tonight that increases with movement and inspiration.  Pressure pain when palpating the area.  Edema bi-lateral extremities lower.  Also c/o pain in his legs.  Iv per ems.  D/cd from this hosp approx 12 days ago.  The pt took 325mg  of aspirin before ems arrived.  Alert no distress nsr

## 2013-08-02 NOTE — Consult Note (Signed)
Consultation  Referring Provider:      Primary Care Physician:  [name] Primary Gastroenterologist:         Reason for Consultation:              HPI:   Charles [name] is a 68 y.o. male with multiple medical problems and history of illicit drug use. Patient has recently been seen at [facility] ED several times for BLE edema and pain. He was just discharged from her on 5/4 after being treated for acute on chronic systolic and diastolic heart failure. Patient brought back to [facility] early this am for chest pain. He was found to have a hgb in 6 range, heme positive. No abdominal pain. Patient reports painless rectal bleeding a few days back. Blood initially bright red than changed to black. No bleeding in a few days but still some intermittent black stools (he takes iron). Patient takes a daily baby ASA, no other NSAIDS   Past Medical History  Diagnosis Date  . Iron deficiency anemia   . Hypertension   . Chronic systolic heart failure     2D Echo (03/2010) - EF 35-40% with akinesis of inferoposterior myocardium  . CAD (coronary artery disease)     s/p 3-vessel CABG (12/2007) // 100% RCA stenosis with collaterals from left system. Severe bifurcation lesions of proxima CXA and OM. Moderate LAD diseaseFollowed by Dr. Wyline [facility]as in [facility]  . Hyperlipidemia LDL goal <70   . Obstructive sleep apnea     not on home CPAP  . Ischemic cardiomyopathy   . Chronic kidney disease (CKD), stage III (moderate)     BL SCr 1.5-1.6  . Homelessness   . 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 // Colonoscopy and EGD in 03/2010 negative per report (record not found)  . 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 Polysubstance abuse     cocaine, THC  . AV malformation of gastrointestinal tract   . Family history of early CAD   . DDD  (degenerative disc disease), lumbar   . Renal artery stenosis   . Peripheral vascular disease   . Seizure disorder   . Diabetes   . Acute on chronic systolic CHF (congestive heart failure) 08/13/2012  . Myocardial infarction   . Seizures     Past Surgical History  Procedure Laterality Date  . Coronary artery bypass graft  12/2007  . Apc  03/2010    To treat small bowel AVMs  . Esophagogastroduodenoscopy N/A 08/14/2012    Procedure: ESOPHAGOGASTRODUODENOSCOPY (EGD);  Surgeon: [name], MD;  Location: [facility] ENDOSCOPY;  Service: Endoscopy;  Laterality: N/A;  . Hot hemostasis N/A 08/14/2012    Procedure: HOT HEMOSTASIS (ARGON PLASMA COAGULATION/BICAP);  Surgeon: [name], MD;  Location: [facility] ENDOSCOPY;  Service: Endoscopy;  Laterality: N/A;    Family History  Problem Relation Age of Onset  . Heart disease Mother     unknown type  . Heart disease Father 44    died of MI at 18yo  . Heart disease Paternal Grandfather 53    died of MI  . Heart disease    . Heart disease Brother 30   No colon cancer or other GI malignancies  History  Substance Use Topics  . Smoking status: Current Every Day Smoker --  0.20 packs/day for 13 years    Types: Cigarettes    Last Attempt to Quit: 03/20/1997  . Smokeless tobacco: Never Used  . Alcohol Use: Yes     Comment: occasionally drinks, a few times a month    Prior to Admission medications   Medication Sig Start Date End Date Taking? Authorizing Provider  carvedilol (COREG) 3.125 MG tablet Take 1 tablet (3.125 mg total) by mouth 2 (two) times daily with a meal. 07/21/13  Yes Charlynne Cousins, MD  divalproex (DEPAKOTE) 500 MG DR tablet Take 1 tablet (500 mg total) by mouth 2 (two) times daily. 03/13/13  Yes [name], MD  ferrous sulfate 325 (65 FE) MG tablet Take 1 tablet (325 mg total) by mouth 2 (two) times daily with a meal. 03/13/13  Yes [name], MD  furosemide (LASIX) 40 MG tablet Take 1.5 tablets (60 mg total) by mouth  daily. 07/23/13  Yes Charlynne Cousins, MD  gabapentin (NEURONTIN) 100 MG capsule Take 1 capsule (100 mg total) by mouth 2 (two) times daily. 07/21/13  Yes Charlynne Cousins, MD  isosorbide-hydrALAZINE (BIDIL) 20-37.5 MG per tablet Take 1 tablet by mouth 3 (three) times daily. 07/21/13  Yes Charlynne Cousins, MD  lisinopril (PRINIVIL,ZESTRIL) 20 MG tablet Take 1 tablet (20 mg total) by mouth 2 (two) times daily. 07/21/13  Yes Charlynne Cousins, MD  phenytoin (DILANTIN) 100 MG ER capsule Take 2-3 capsules (200-300 mg total) by mouth 2 (two) times daily. Take 2 capsules by mouth in the morning and 3 capsules by mouth at bedtime. 03/13/13  Yes [name], MD  amLODipine (NORVASC) 5 MG tablet Take 1 tablet (5 mg total) by mouth daily. 03/13/13   [name], MD  mupirocin ointment (BACTROBAN) 2 % Place 1 application into the nose 2 (two) times daily. 03/13/13   [name], MD  pantoprazole (PROTONIX) 40 MG tablet Take 1 tablet (40 mg total) by mouth 2 (two) times daily. 03/19/13   Verlee Monte, MD  potassium chloride (K-DUR) 10 MEQ tablet Take 1 tablet (10 mEq total) by mouth daily. 03/13/13   [name], MD  simvastatin (ZOCOR) 40 MG tablet Take 1 tablet (40 mg total) by mouth every evening. 03/13/13   [name], MD  spironolactone (ALDACTONE) 50 MG tablet Take 1 tablet (50 mg total) by mouth daily. 07/23/13   Charlynne Cousins, MD    Current Facility-Administered Medications  Medication Dose Route Frequency Provider Last Rate Last Dose  . diphenhydrAMINE (BENADRYL) injection 25 mg  25 mg Intravenous Q6H PRN Thurnell Lose, MD   25 mg at 08/02/13 0845  . furosemide (LASIX) injection 40 mg  40 mg Intravenous Once Thurnell Lose, MD      . magnesium sulfate IVPB 1 g 100 mL  1 g Intravenous Once Thurnell Lose, MD      . pantoprazole (PROTONIX) injection 40 mg  40 mg Intravenous Q12H Thurnell Lose, MD       Current Outpatient Prescriptions  Medication Sig  Dispense Refill  . carvedilol (COREG) 3.125 MG tablet Take 1 tablet (3.125 mg total) by mouth 2 (two) times daily with a meal.  60 tablet  0  . divalproex (DEPAKOTE) 500 MG DR tablet Take 1 tablet (500 mg total) by mouth 2 (two) times daily.  60 tablet  1  . ferrous sulfate 325 (65 FE) MG tablet Take 1 tablet (325 mg total) by mouth 2 (  two) times daily with a meal.  30 tablet  3  . furosemide (LASIX) 40 MG tablet Take 1.5 tablets (60 mg total) by mouth daily.  30 tablet  3  . gabapentin (NEURONTIN) 100 MG capsule Take 1 capsule (100 mg total) by mouth 2 (two) times daily.  60 capsule  2  . isosorbide-hydrALAZINE (BIDIL) 20-37.5 MG per tablet Take 1 tablet by mouth 3 (three) times daily.  90 tablet  2  . lisinopril (PRINIVIL,ZESTRIL) 20 MG tablet Take 1 tablet (20 mg total) by mouth 2 (two) times daily.  30 tablet  0  . phenytoin (DILANTIN) 100 MG ER capsule Take 2-3 capsules (200-300 mg total) by mouth 2 (two) times daily. Take 2 capsules by mouth in the morning and 3 capsules by mouth at bedtime.  90 capsule  2  . amLODipine (NORVASC) 5 MG tablet Take 1 tablet (5 mg total) by mouth daily.  30 tablet  2  . mupirocin ointment (BACTROBAN) 2 % Place 1 application into the nose 2 (two) times daily.  22 g  0  . pantoprazole (PROTONIX) 40 MG tablet Take 1 tablet (40 mg total) by mouth 2 (two) times daily.  60 tablet  0  . potassium chloride (K-DUR) 10 MEQ tablet Take 1 tablet (10 mEq total) by mouth daily.  30 tablet  1  . simvastatin (ZOCOR) 40 MG tablet Take 1 tablet (40 mg total) by mouth every evening.  30 tablet  1  . spironolactone (ALDACTONE) 50 MG tablet Take 1 tablet (50 mg total) by mouth daily.  30 tablet  2    Allergies as of 08/02/2013 - Review Complete 08/02/2013  Allergen Reaction Noted  . Motrin [ibuprofen] Other (See Comments) 05/16/2010  . Tylenol [acetaminophen] Other (See Comments) 05/16/2010    Review of Systems:    Positive for recent lower extremity swelling. All systems  reviewed and negative except where noted in HPI.   Physical Exam:  Vital signs in last 24 hours: Temp:  [98 F (36.7 C)] 98 F (36.7 C) (05/16 0850) Pulse Rate:  [71-84] 77 (05/16 0700) Resp:  [14-28] 28 (05/16 0700) BP: (142-174)/(75-98) 174/97 mmHg (05/16 0850) SpO2:  [99 %-100 %] 99 % (05/16 0700) Weight:  [160 lb (72.576 kg)] 160 lb (72.576 kg) (05/16 0507)   General:   Black male lying in bed uncomfortable with lower extremity pain.  Head: Normocephalic and atraumatic. Eyes:   No icterus.   Conjunctiva pink. Ears:  Normal auditory acuity. Neck:  Supple; no masses felt Lungs:  Respirations even and unlabored. Lungs clear to auscultation bilaterally.   No wheezes, crackles, or rhonchi.  Heart:  Regular rate and rhythm. Abdomen:  Soft, nondistended, nontender. Normal bowel sounds. No appreciable masses or hepatomegaly.  Rectal:  No blood or stool on DRE.  Msk:  Symmetrical without gross deformities.  Extremities:  Without edema. Neurologic:  Alert and  oriented x4;  grossly normal neurologically. Skin:  Intact without significant lesions or rashes. Cervical Nodes:  No significant cervical adenopathy. Psych:  Alert and cooperative. Normal affect.  LAB RESULTS:  Recent Labs  08/02/13 0514  WBC 5.0  HGB 6.6*  HCT 21.7*  PLT 386   BMET  Recent Labs  08/02/13 0514  NA 139  K 4.5  CL 104  CO2 23  GLUCOSE 93  BUN 24*  CREATININE 1.16  CALCIUM 8.3*   LFT  Recent Labs  08/02/13 0514  PROT 6.9  ALBUMIN 2.8*  AST 77*  ALT  33  ALKPHOS 102  BILITOT 0.3   PT/INR  Recent Labs  08/02/13 0808  LABPROT 13.6  INR 1.06    STUDIES: Dg Chest Portable 1 View  08/02/2013   CLINICAL DATA:  Chest pain  EXAM: PORTABLE CHEST - 1 VIEW  COMPARISON:  07/15/2013  FINDINGS: Stable moderate cardiomegaly. Status post CABG. Mild pulmonary hyperinflation with chronic interstitial coarsening. No edema or consolidation. No effusion or pneumothorax.  IMPRESSION: Cardiomegaly and  pulmonary venous congestion.   Electronically Signed   By: Jorje Guild M.D.   On: 08/02/2013 08:33    PREVIOUS ENDOSCOPIES:             GD with control of bleeding May 2014. Small patch of abnormal mucosa and bleeding in stomach. Area ablated   2012 workup for Heme+ stools and anemia.   EGD  Negative other than HH.   Enterosocopy 2012 for heme + stool / anemia and negative EGD /Colon. Findings 3 small bowel AVMs, treated with APC, 2 cm HH  [facility] Capsule 2012. PROCEDURE INFO & FINDINGS: 1)COMPLETE STUDY, GOOD PREP 2)ACTIVE OOZING OF BLOOD IN THE PROXIMAL SMALL BOWEL, UNABLE TO IDENTIFY SOURCE. BLOOD FIRST SEEN ABOUT 35 MINUTES BEYOND FIRST DUODENAL IMAGE.   Colonoscopy Jan 2012   ENDOSCOPIC IMPRESSION:  1) Normal colon  2) Internal hemorrhoids    Impression / Plan:   71. 68 year old male in ED with chest pain and bilateral lower leg pain. Workup in progress.   2. Recent GI bleed. Patient describes passage of red blood followed by black stools several days back. He is not a great historian. Patient takes iron tablets. He currently has no blood or stool on DRE. BUN hasn't been elevated (at least not out of proportion to creatinine). Difficult to know at this point where he bled from as history isn't clear and no active bleeding now (just heme +). He does have known history of small bowel AVMs and this is a possibility. Cocaine can cause some bowel ischemia but less likely.   2. Acute on chronic anemia, baseline hgb mid 7 to 9, currently down to 6.6. Marland KitchenHis ferrin is low normal at 24, MCV low normal at 81. Patient may have element of iron deficiency. Getting 2 units of blood.   3. Chest pain, troponin negative. Just pulmonary vascular congestion on CXR. Chest pain may be from anemia but patient has significant cardiac history, cardiology to see.  4. Multiple medical problems  5. Illicit drug use (cocaine)  Thanks   LOS: 0 days   Willia Craze  08/02/2013, 9:13  AM   ________________________________________________________________________  Velora Heckler GI MD note:  I personally examined the patient, reviewed the data and agree with the assessment and plan described above.  He will probably need endoscopic evaluation but given CP, complex cardiac history he needs blood transfusion and consider cardiology evaluation first.   For now twice daily PPI, orally is fine. He is not actively bleeding.   Owens Loffler, MD [facility] Pager [phone]

## 2013-08-02 NOTE — H&P (Addendum)
Patient Demographics  Charles [name], is a 68 y.o. male  MRN: [id number]   DOB - 11-04-1945  Admit Date - 08/02/2013  Outpatient Primary MD for the patient is [name]   With History of -  Past Medical History  Diagnosis Date  . Iron deficiency anemia   . Hypertension   . Chronic systolic heart failure     2D Echo (03/2010) - EF 35-40% with akinesis of inferoposterior myocardium  . CAD (coronary artery disease)     s/p 3-vessel CABG (12/2007) // 100% RCA stenosis with collaterals from left system. Severe bifurcation lesions of proxima CXA and OM. Moderate LAD diseaseFollowed by Dr. Wyline Copas in [facility]  . Hyperlipidemia LDL goal <70   . Obstructive sleep apnea     not on home CPAP  . Ischemic cardiomyopathy   . Chronic kidney disease (CKD), stage III (moderate)     BL SCr 1.5-1.6  . Homelessness   . 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 // Colonoscopy and EGD in 03/2010 negative per report (record not found)  . 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 Polysubstance abuse     cocaine, THC  . AV malformation of gastrointestinal tract   . Family history of early CAD   . DDD (degenerative disc disease), lumbar   . Renal artery stenosis   . Peripheral vascular disease   . Seizure disorder   . Diabetes   . Acute on chronic systolic CHF (congestive heart failure) 08/13/2012  . Myocardial infarction   . Seizures       Past Surgical History  Procedure Laterality Date  . Coronary artery bypass graft  12/2007  . Apc  03/2010    To treat small bowel AVMs  . Esophagogastroduodenoscopy N/A 08/14/2012    Procedure: ESOPHAGOGASTRODUODENOSCOPY (EGD);   Surgeon: [name], MD;  Location: [facility] ENDOSCOPY;  Service: Endoscopy;  Laterality: N/A;  . Hot hemostasis N/A 08/14/2012    Procedure: HOT HEMOSTASIS (ARGON PLASMA COAGULATION/BICAP);  Surgeon: [name], MD;  Location: [facility] ENDOSCOPY;  Service: Endoscopy;  Laterality: N/A;    in for   Chief Complaint  Patient presents with  . Chest Pain     HPI  Charles [name]  is a 68 y.o. male, history of ischemic cardiomyopathy status post CABG, chronic diastolic and systolic heart failure EF of 50% on recent echo which is improved from 35% before, polysubstance abuse including cocaine says quit several months ago, hypertension, iron deficiency anemia, dyslipidemia, seizure disorder, PAD, chronic bilateral lower extremity pain and ache who lives in [facility] and presented to the ER with atypical chest discomfort which started 2 days ago, says he has intermittent substernal pressure which is nonradiating, not associated with activity, sometimes associated with shortness of breath, no  aggravating or relieving factors, in the ER chest x-ray shows mild pulmonary congestion, EKG unremarkable, troponin negative, workup suggestive of acute on chronic anemia, he was Hemoccult positive stool. I was called to admit the patient for anemia and atypical chest pain.   Patient currently denies any headache, no vision hearing or also in food or liquids, currently chest pain-free no shortness of breath or palpitations, no cough or fevers, denies any abdominal pain or nausea, he does say that his stools have been slightly dark in color for the last few days but no diarrhea or frank blood, chronic lower extremity pain which has been ongoing for couple of years, his weight has been stable at around 160 pounds, denies any focal weakness.    Review of Systems    In addition to the HPI above,   No Fever-chills, No Headache, No changes with Vision or hearing, No problems swallowing food or Liquids, + Chest pain, Cough or  Shortness of Breath, No Abdominal pain, No Nausea or Vommitting, Bowel movements are regular, No Blood in stool or Urine, +ve dark stools No dysuria, No new skin rashes or bruises, No new joints pains-aches, chronic Bilat leg pain No new weakness, tingling, numbness in any extremity, No recent weight gain or loss, No polyuria, polydypsia or polyphagia, No significant Mental Stressors.  A full 10 point Review of Systems was done, except as stated above, all other Review of Systems were negative.   Social History History  Substance Use Topics  . Smoking status: Current Every Day Smoker -- 0.20 packs/day for 13 years    Types: Cigarettes    Last Attempt to Quit: 03/20/1997  . Smokeless tobacco: Never Used  . Alcohol Use: Yes     Comment: occasionally drinks, a few times a month      Family History Family History  Problem Relation Age of Onset  . Heart disease Mother     unknown type  . Heart disease Father 73    died of MI at 43yo  . Heart disease Paternal Grandfather 52    died of MI  . Heart disease    . Heart disease Brother 30      Prior to Admission medications   Medication Sig Start Date End Date Taking? Authorizing Provider  carvedilol (COREG) 3.125 MG tablet Take 1 tablet (3.125 mg total) by mouth 2 (two) times daily with a meal. 07/21/13  Yes Charlynne Cousins, MD  divalproex (DEPAKOTE) 500 MG DR tablet Take 1 tablet (500 mg total) by mouth 2 (two) times daily. 03/13/13  Yes [name], MD  ferrous sulfate 325 (65 FE) MG tablet Take 1 tablet (325 mg total) by mouth 2 (two) times daily with a meal. 03/13/13  Yes [name], MD  furosemide (LASIX) 40 MG tablet Take 1.5 tablets (60 mg total) by mouth daily. 07/23/13  Yes Charlynne Cousins, MD  gabapentin (NEURONTIN) 100 MG capsule Take 1 capsule (100 mg total) by mouth 2 (two) times daily. 07/21/13  Yes Charlynne Cousins, MD  isosorbide-hydrALAZINE (BIDIL) 20-37.5 MG per tablet Take 1 tablet by mouth 3  (three) times daily. 07/21/13  Yes Charlynne Cousins, MD  lisinopril (PRINIVIL,ZESTRIL) 20 MG tablet Take 1 tablet (20 mg total) by mouth 2 (two) times daily. 07/21/13  Yes Charlynne Cousins, MD  phenytoin (DILANTIN) 100 MG ER capsule Take 2-3 capsules (200-300 mg total) by mouth 2 (two) times daily. Take 2 capsules by mouth in the morning and 3  capsules by mouth at bedtime. 03/13/13  Yes [name], MD  amLODipine (NORVASC) 5 MG tablet Take 1 tablet (5 mg total) by mouth daily. 03/13/13   [name], MD  mupirocin ointment (BACTROBAN) 2 % Place 1 application into the nose 2 (two) times daily. 03/13/13   [name], MD  pantoprazole (PROTONIX) 40 MG tablet Take 1 tablet (40 mg total) by mouth 2 (two) times daily. 03/19/13   Verlee Monte, MD  potassium chloride (K-DUR) 10 MEQ tablet Take 1 tablet (10 mEq total) by mouth daily. 03/13/13   [name], MD  simvastatin (ZOCOR) 40 MG tablet Take 1 tablet (40 mg total) by mouth every evening. 03/13/13   [name], MD  spironolactone (ALDACTONE) 50 MG tablet Take 1 tablet (50 mg total) by mouth daily. 07/23/13   Charlynne Cousins, MD    Allergies  Allergen Reactions  . Motrin [Ibuprofen] Other (See Comments)    Affects kidneys  . Tylenol [Acetaminophen] Other (See Comments)    Affects kidneys    Physical Exam  Vitals  Blood pressure 142/88, pulse 77, temperature 98 F (36.7 C), temperature source Oral, resp. rate 28, height 5\' 5"  (1.651 m), weight 72.576 kg (160 lb), SpO2 99.00%.   1. General middle aged [place] male lying in bed in NAD,  2. Normal affect and insight, Not Suicidal or Homicidal, Awake Alert, Oriented X 3.  3. No F.N deficits, ALL C.Nerves Intact, Strength 5/5 all 4 extremities, Sensation intact all 4 extremities, Plantars down going.  4. Ears and Eyes appear Normal, Conjunctivae clear, PERRLA. Moist Oral Mucosa.  5. Supple Neck, No JVD, No cervical lymphadenopathy appriciated, No Carotid  Bruits.  6. Symmetrical Chest wall movement, Good air movement bilaterally, CTAB.  7. RRR, No Gallops, Rubs or Murmurs, No Parasternal Heave.  8. Positive Bowel Sounds, Abdomen Soft, Non tender, No organomegaly appriciated,No rebound -guarding or rigidity. Haem +ve dark brown stool  9.  No Cyanosis, Normal Skin Turgor, No Skin Rash or Bruise.  10. Good muscle tone,  joints appear normal , no effusions, Normal ROM.  11. No Palpable Lymph Nodes in Neck or Axillae     Data Review  CBC  Recent Labs Lab 08/02/13 0514  WBC 5.0  HGB 6.6*  HCT 21.7*  PLT 386  MCV 84.1  MCH 25.6*  MCHC 30.4  RDW 20.2*  LYMPHSABS 1.0  MONOABS 0.5  EOSABS 0.2  BASOSABS 0.1   ------------------------------------------------------------------------------------------------------------------  Chemistries   Recent Labs Lab 08/02/13 0514  NA 139  K 4.5  CL 104  CO2 23  GLUCOSE 93  BUN 24*  CREATININE 1.16  CALCIUM 8.3*  AST 77*  ALT 33  ALKPHOS 102  BILITOT 0.3   ------------------------------------------------------------------------------------------------------------------ estimated creatinine clearance is 52.3 ml/min (by C-G formula based on Cr of 1.16). ------------------------------------------------------------------------------------------------------------------ No results found for this basename: TSH, T4TOTAL, FREET3, T3FREE, THYROIDAB,  in the last 72 hours   Coagulation profile  Recent Labs Lab 08/02/13 0808  INR 1.06   ------------------------------------------------------------------------------------------------------------------- No results found for this basename: DDIMER,  in the last 72 hours -------------------------------------------------------------------------------------------------------------------  Cardiac Enzymes No results found for this basename: CK, CKMB, TROPONINI, MYOGLOBIN,  in the last 168  hours ------------------------------------------------------------------------------------------------------------------ No components found with this basename: POCBNP,    ---------------------------------------------------------------------------------------------------------------  Urinalysis    Component Value Date/Time   COLORURINE YELLOW 03/08/2013 2136   APPEARANCEUR CLEAR 03/08/2013 2136   LABSPEC 1.025 03/08/2013 2136   PHURINE 5.0 03/08/2013 2136  GLUCOSEU NEGATIVE 03/08/2013 2136   HGBUR NEGATIVE 03/08/2013 2136   BILIRUBINUR NEGATIVE 03/08/2013 2136   KETONESUR NEGATIVE 03/08/2013 2136   PROTEINUR >300* 03/08/2013 2136   UROBILINOGEN 1.0 03/08/2013 2136   NITRITE NEGATIVE 03/08/2013 2136   LEUKOCYTESUR NEGATIVE 03/08/2013 2136    ----------------------------------------------------------------------------------------------------------------  Imaging results:     Nm Pulmonary Perf And Vent  07/17/2013   CLINICAL DATA:  Right leg pain and swelling. Question pulmonary embolus.  EXAM: NUCLEAR MEDICINE VENTILATION - PERFUSION LUNG SCAN  TECHNIQUE: Ventilation images were obtained in multiple projections using inhaled aerosol technetium 99 M DTPA. Perfusion images were obtained in multiple projections after intravenous injection of Tc-55m MAA.  RADIOPHARMACEUTICALS:  40 mCi Tc-36m DTPA aerosol and 6 mCi Tc-35m MAA  COMPARISON:  PA and lateral chest 07/15/2013.  CT scan 04/08/2010.  FINDINGS: Ventilation: No focal ventilation defect.  Perfusion: No wedge shaped peripheral perfusion defects to suggest acute pulmonary embolism.  IMPRESSION: Negative exam.   Electronically Signed   By: Inge Rise M.D.   On: 07/17/2013 12:32   Dg Chest Portable 1 View  08/02/2013   CLINICAL DATA:  Chest pain  EXAM: PORTABLE CHEST - 1 VIEW  COMPARISON:  07/15/2013  FINDINGS: Stable moderate cardiomegaly. Status post CABG. Mild pulmonary hyperinflation with chronic interstitial coarsening. No  edema or consolidation. No effusion or pneumothorax.  IMPRESSION: Cardiomegaly and pulmonary venous congestion.   Electronically Signed   By: Jorje Guild M.D.   On: 08/02/2013 08:33    My personal review of EKG: Rhythm NSR, Rate  83 /min, QTc 505 , no Acute ST changes    Assessment & Plan    1.Anemia - Haem +ve stool - history of upper GI AVM bleed in 2012, will be kept for 23 hour tele bed, cycle H&H, transfuse 2 units of packed RBC with 40 mg of IV Lasix in between the units, IV PPI, clear liquid diet, [facility] physician Dr. Ardis Hughs consulted.   2. CAD with ischemic cardiomyopathy. Some atypical chest pain, could be related to #1 above, transfuse, cycle troponin, continue home dose beta blocker , long-acting nitrate, statin for secondary prevention. If he makes troponins consult cardiology.   3. Chronic combined systolic and diastolic heart failure EF now improved 50% to 55%. He appears compensated from the standpoint, continue Lasix at home dose, continue home dose ACE inhibitor and beta blocker along with bidil, fluid and salt restriction.    4. History of seizures. Continue home dose of Depakote and Dilantin at home dose.    5. PAD with lateral lower extremity chronic pain. Continue Neurontin, will repeat ABI. Continue statin, once stable from anemia standpoint may add low-dose aspirin for secondary prevention.    6. Dyslipidemia. Continue statin.    7. Hypertension. Continue Coreg, Norvasc, Bidil, Lasix and Aldactone.    8. History of polysubstance abuse including cocaine abuse. Says he has not used cocaine in months, drinks occasional alcohol and smokes occasional cigarette, check urine drug screen. Counseled to abstain from all.    9. Mildly prolonged QTC. We'll give 1 g of mag, continue beta blocker, EF greater than 50%, telemetry monitor.     DVT Prophylaxis  SCDs   AM Labs Ordered, also please review Full Orders  Family Communication: Admission,  patients condition and plan of care including tests being ordered have been discussed with the patient and he indicates understanding and agree with the plan and Code Status.  Code Status full  Likely DC to Home  Condition  FAIR  Time spent in minutes : 35    Thurnell Lose M.D on 08/02/2013 at 9:05 AM  Between 7am to 7pm - Pager - [phone]  After 7pm go to [email or web address] - password [email or web address]  And look for the night coverage person covering me after hours  [facility] Office  [phone]

## 2013-08-02 NOTE — ED Provider Notes (Signed)
CSN: [id number]     Arrival date & time 08/02/13  0500 History   First MD Initiated Contact with Patient 08/02/13 [phone]     Chief Complaint  Patient presents with  . Chest Pain     (Consider location/radiation/quality/duration/timing/severity/associated sxs/prior Treatment) HPI Patient is a 68 yo man with multiple chronic medical problems including moderate to severe pulmonary HTN, CAD (s/p CABG), HLD, OSA, CHF, CKD stage III, HTN, chronic anemia, polysubstance abuse, seizure disorder, PVD.   He is BIB EMS. He complained of chest pain. I had a hard time waking him up and keeping him awake to answer questions. He told me to read his chart to find out why he is here and then went back to sleep. I woke him up again.   Patient says he has to be admitted because his feet and legs hurt. He says this is a chronic problem.   Past Medical History  Diagnosis Date  . Iron deficiency anemia   . Hypertension   . Chronic systolic heart failure     2D Echo (03/2010) - EF 35-40% with akinesis of inferoposterior myocardium  . CAD (coronary artery disease)     s/p 3-vessel CABG (12/2007) // 100% RCA stenosis with collaterals from left system. Severe bifurcation lesions of proxima CXA and OM. Moderate LAD diseaseFollowed by Dr. Wyline Copas in [facility]  . Hyperlipidemia LDL goal <70   . Obstructive sleep apnea     not on home CPAP  . Ischemic cardiomyopathy   . Chronic kidney disease (CKD), stage III (moderate)     BL SCr 1.5-1.6  . Homelessness   . 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 // Colonoscopy and EGD in 03/2010 negative per report (record not found)  . 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 Polysubstance abuse     cocaine, THC  . AV malformation of gastrointestinal tract   . Family history of early CAD   . DDD (degenerative disc  disease), lumbar   . Renal artery stenosis   . Peripheral vascular disease   . Seizure disorder   . Diabetes   . Acute on chronic systolic CHF (congestive heart failure) 08/13/2012  . Myocardial infarction   . Seizures    Past Surgical History  Procedure Laterality Date  . Coronary artery bypass graft  12/2007  . Apc  03/2010    To treat small bowel AVMs  . Esophagogastroduodenoscopy N/A 08/14/2012    Procedure: ESOPHAGOGASTRODUODENOSCOPY (EGD);  Surgeon: [name], MD;  Location[facility] ENDOSCOPY;  Service: Endoscopy;  Laterality: N/A;  . Hot hemostasis N/A 08/14/2012    Procedure: HOT HEMOSTASIS (ARGON PLASMA COAGULATION/BICAP);  Surgeon: [name], MD;  Location: [facility] ENDOSCOPY;  Service: Endoscopy;  Laterality: N/A;   Family History  Problem Relation Age of Onset  . Heart disease Mother     unknown type  . Heart disease Father 87    died of MI at 75yo  . Heart disease Paternal Grandfather 20    died of MI  . Heart disease    . Heart disease Brother 30   History  Substance Use Topics  . Smoking status: Current Every Day Smoker -- 0.20 packs/day for 13 years    Types: Cigarettes    Last Attempt to Quit: 03/20/1997  . Smokeless tobacco: Never Used  .  Alcohol Use: Yes     Comment: occasionally drinks, a few times a month    Review of Systems Ten point review of symptoms performed and is negative with the exception of symptoms noted above. Chronic DOE    Allergies  Motrin and Tylenol  Home Medications   Prior to Admission medications   Medication Sig Start Date End Date Taking? Authorizing Provider  carvedilol (COREG) 3.125 MG tablet Take 1 tablet (3.125 mg total) by mouth 2 (two) times daily with a meal. 07/21/13  Yes Charlynne Cousins, MD  divalproex (DEPAKOTE) 500 MG DR tablet Take 1 tablet (500 mg total) by mouth 2 (two) times daily. 03/13/13  Yes [name], MD  ferrous sulfate 325 (65 FE) MG tablet Take 1 tablet (325 mg total) by mouth 2 (two) times daily  with a meal. 03/13/13  Yes [name], MD  furosemide (LASIX) 40 MG tablet Take 1.5 tablets (60 mg total) by mouth daily. 07/23/13  Yes Charlynne Cousins, MD  gabapentin (NEURONTIN) 100 MG capsule Take 1 capsule (100 mg total) by mouth 2 (two) times daily. 07/21/13  Yes Charlynne Cousins, MD  isosorbide-hydrALAZINE (BIDIL) 20-37.5 MG per tablet Take 1 tablet by mouth 3 (three) times daily. 07/21/13  Yes Charlynne Cousins, MD  lisinopril (PRINIVIL,ZESTRIL) 20 MG tablet Take 1 tablet (20 mg total) by mouth 2 (two) times daily. 07/21/13  Yes Charlynne Cousins, MD  phenytoin (DILANTIN) 100 MG ER capsule Take 2-3 capsules (200-300 mg total) by mouth 2 (two) times daily. Take 2 capsules by mouth in the morning and 3 capsules by mouth at bedtime. 03/13/13  Yes [name], MD  amLODipine (NORVASC) 5 MG tablet Take 1 tablet (5 mg total) by mouth daily. 03/13/13   [name], MD  mupirocin ointment (BACTROBAN) 2 % Place 1 application into the nose 2 (two) times daily. 03/13/13   [name], MD  pantoprazole (PROTONIX) 40 MG tablet Take 1 tablet (40 mg total) by mouth 2 (two) times daily. 03/19/13   Verlee Monte, MD  potassium chloride (K-DUR) 10 MEQ tablet Take 1 tablet (10 mEq total) by mouth daily. 03/13/13   [name], MD  simvastatin (ZOCOR) 40 MG tablet Take 1 tablet (40 mg total) by mouth every evening. 03/13/13   [name], MD  spironolactone (ALDACTONE) 50 MG tablet Take 1 tablet (50 mg total) by mouth daily. 07/23/13   Charlynne Cousins, MD   BP 168/98  Pulse 84  Temp(Src) 98 F (36.7 C) (Oral)  Resp 19  Ht 5\' 5"  (1.651 m)  Wt 160 lb (72.576 kg)  BMI 26.63 kg/m2  SpO2 100% Physical Exam Gen: well developed and well nourished appearing, chronically ill appearing. Head: NCAT Eyes: PERL, EOMI Nose: no epistaixis or rhinorrhea Mouth/throat: mucosa is moist and pink Neck: supple, no stridor Lungs: CTA B, no wheezing, rhonchi or rales CV: RRR, no murmur,  extremities appear well perfused.  Abd: soft, notender, nondistended Back: no ttp, no cva ttp Skin: warm and dry Ext: normal to inspection, no dependent edema Neuro: CN ii-xii grossly intact, no focal deficits Psyche; normal affect,  calm and cooperative.   ED Course  Procedures (including critical care time) Labs Review  Results for orders placed during the hospital encounter of 08/02/13 (from the past 24 hour(s))  CBC WITH DIFFERENTIAL     Status: Abnormal   Collection Time    08/02/13  5:14 AM  Result Value Ref Range   WBC 5.0  4.0 - 10.5 K/uL   RBC 2.58 (*) 4.22 - 5.81 MIL/uL   Hemoglobin 6.6 (*) 13.0 - 17.0 g/dL   HCT 21.7 (*) 39.0 - 52.0 %   MCV 84.1  78.0 - 100.0 fL   MCH 25.6 (*) 26.0 - 34.0 pg   MCHC 30.4  30.0 - 36.0 g/dL   RDW 20.2 (*) 11.5 - 15.5 %   Platelets 386  150 - 400 K/uL   Neutrophils Relative % 67  43 - 77 %   Lymphocytes Relative 19  12 - 46 %   Monocytes Relative 9  3 - 12 %   Eosinophils Relative 4  0 - 5 %   Basophils Relative 1  0 - 1 %   Neutro Abs 3.2  1.7 - 7.7 K/uL   Lymphs Abs 1.0  0.7 - 4.0 K/uL   Monocytes Absolute 0.5  0.1 - 1.0 K/uL   Eosinophils Absolute 0.2  0.0 - 0.7 K/uL   Basophils Absolute 0.1  0.0 - 0.1 K/uL   RBC Morphology TARGET CELLS    COMPREHENSIVE METABOLIC PANEL     Status: Abnormal   Collection Time    08/02/13  5:14 AM      Result Value Ref Range   Sodium 139  137 - 147 mEq/L   Potassium 4.5  3.7 - 5.3 mEq/L   Chloride 104  96 - 112 mEq/L   CO2 23  19 - 32 mEq/L   Glucose, Bld 93  70 - 99 mg/dL   BUN 24 (*) 6 - 23 mg/dL   Creatinine, Ser 1.16  0.50 - 1.35 mg/dL   Calcium 8.3 (*) 8.4 - 10.5 mg/dL   Total Protein 6.9  6.0 - 8.3 g/dL   Albumin 2.8 (*) 3.5 - 5.2 g/dL   AST 77 (*) 0 - 37 U/L   ALT 33  0 - 53 U/L   Alkaline Phosphatase 102  39 - 117 U/L   Total Bilirubin 0.3  0.3 - 1.2 mg/dL   GFR calc non Af Amer 62 (*) >90 mL/min   GFR calc Af Amer 72 (*) >90 mL/min  TYPE AND SCREEN     Status: None    Collection Time    08/02/13  6:30 AM      Result Value Ref Range   ABO/RH(D) O POS     Antibody Screen NEG     Sample Expiration 08/05/2013     Unit Number [id number]     Blood Component Type RED CELLS,LR     Unit division 00     Status of Unit ALLOCATED     Transfusion Status OK TO TRANSFUSE     Crossmatch Result Compatible    PREPARE RBC (CROSSMATCH)     Status: None   Collection Time    08/02/13  6:30 AM      Result Value Ref Range   Order Confirmation ORDER PROCESSED BY BLOOD BANK    POC OCCULT BLOOD, ED     Status: Abnormal   Collection Time    08/02/13  8:03 AM      Result Value Ref Range   Fecal Occult Bld POSITIVE (*) NEGATIVE   EKG: nsr, no acute ischemic changes, normal intervals, normal axis, normal qrs complex  EXAM:  PORTABLE CHEST - 1 VIEW  COMPARISON: 07/15/2013  FINDINGS:  Stable moderate cardiomegaly. Status post CABG. Mild pulmonary  hyperinflation with chronic interstitial coarsening. No edema  or  consolidation. No effusion or pneumothorax.  IMPRESSION:  Cardiomegaly and pulmonary venous congestion.  Electronically Signed  By: Jorje Guild M.D.  On: 08/02/2013 08:33  CRITICAL CARE Performed by: Elyn Peers   Total critical care time: 26m  Critical care time was exclusive of separately billable procedures and treating other patients.  Critical care was necessary to treat or prevent imminent or life-threatening deterioration.  Critical care was time spent personally by me on the following activities: development of treatment plan with patient and/or surrogate as well as nursing, discussions with consultants, evaluation of patient's response to treatment, examination of patient, obtaining history from patient or surrogate, ordering and performing treatments and interventions, ordering and review of laboratory studies, ordering and review of radiographic studies, pulse oximetry and re-evaluation of patient's condition.    MDM   Patient with  multiple chronic medical problems presented via EMS with initial complaint of chest pain. Appeared unwilling to provide any additional history to me. But, denied chest pain at the time of my history  ED work up notable for worsening anemia and melanotic stool which is heme positive. The patient will require close monitoring as he is transfused in light of propensity for volume overload, worsening pulmonary edema. We have initiated order to transfuse. Case discussed with [facility] who will admit.     Elyn Peers, MD 08/07/13 [phone]

## 2013-08-03 DIAGNOSIS — D509 Iron deficiency anemia, unspecified: Secondary | ICD-10-CM | POA: Diagnosis present

## 2013-08-03 DIAGNOSIS — I5023 Acute on chronic systolic (congestive) heart failure: Secondary | ICD-10-CM

## 2013-08-03 DIAGNOSIS — I10 Essential (primary) hypertension: Secondary | ICD-10-CM

## 2013-08-03 DIAGNOSIS — I509 Heart failure, unspecified: Secondary | ICD-10-CM

## 2013-08-03 LAB — TYPE AND SCREEN
ABO/RH(D): O POS
Antibody Screen: NEGATIVE
Unit division: 0
Unit division: 0

## 2013-08-03 LAB — BASIC METABOLIC PANEL
BUN: 24 mg/dL — ABNORMAL HIGH (ref 6–23)
CALCIUM: 8.7 mg/dL (ref 8.4–10.5)
CO2: 23 mEq/L (ref 19–32)
Chloride: 99 mEq/L (ref 96–112)
Creatinine, Ser: 1.23 mg/dL (ref 0.50–1.35)
GFR calc Af Amer: 67 mL/min — ABNORMAL LOW (ref >90)
GFR, EST NON AFRICAN AMERICAN: 58 mL/min — AB (ref >90)
Glucose, Bld: 119 mg/dL — ABNORMAL HIGH (ref 70–99)
Potassium: 4.5 mEq/L (ref 3.7–5.3)
SODIUM: 134 meq/L — AB (ref 137–147)

## 2013-08-03 LAB — CBC
HCT: 26.7 % — ABNORMAL LOW (ref 39.0–52.0)
Hemoglobin: 8.4 g/dL — ABNORMAL LOW (ref 13.0–17.0)
MCH: 25.9 pg — ABNORMAL LOW (ref 26.0–34.0)
MCHC: 31.5 g/dL (ref 30.0–36.0)
MCV: 82.4 fL (ref 78.0–100.0)
PLATELETS: 376 10*3/uL (ref 150–400)
RBC: 3.24 MIL/uL — AB (ref 4.22–5.81)
RDW: 19.2 % — ABNORMAL HIGH (ref 11.5–15.5)
WBC: 6.3 10*3/uL (ref 4.0–10.5)

## 2013-08-03 MED ORDER — OXYCODONE HCL 5 MG PO TABS
5.0000 mg | ORAL_TABLET | Freq: Once | ORAL | Status: AC
  Administered 2013-08-03: 5 mg via ORAL
  Filled 2013-08-03: qty 1

## 2013-08-03 MED ORDER — ZOLPIDEM TARTRATE 5 MG PO TABS
5.0000 mg | ORAL_TABLET | Freq: Once | ORAL | Status: AC
  Administered 2013-08-03: 5 mg via ORAL
  Filled 2013-08-03: qty 1

## 2013-08-03 NOTE — Progress Notes (Addendum)
Per patient's request- CSW contacted the Social Security office- and spoke to Ms.Silas Sacramento. She stated that she did not have an appointment to see patient nor was she expecting a call from him. She related that they had spoken last week and she requested that patient send her some information via mail. CSW notified patient this but he stated that this was not true and that CSW was "lying to him."  CSW provided patient with number and extension of Ms. Silas Sacramento and also called number to connect him so that he could speak to her directly.  Patient then told CSW to leave his room. Nursing stated that patient is not homeless and that he lives with a family member but would not say who.  He also has a daughter but would not give permission for her to be contacted.  Lorie Phenix. [place], [place]

## 2013-08-03 NOTE — Progress Notes (Addendum)
[facility] Progress Note    Since last GI note: No overt GI bleeding.  HD stable.    tox screen + for cocaine (again)  Objective: Vital signs in last 24 hours: Temp:  [97.4 F (36.3 C)-98.6 F (37 C)] 98.3 F (36.8 C) (05/17 0553) Pulse Rate:  [76-92] 80 (05/17 0553) Resp:  [16-18] 16 (05/17 0553) BP: (135-186)/(67-112) 170/71 mmHg (05/17 0553) SpO2:  [95 %-100 %] 100 % (05/17 0553) Weight:  [168 lb 14.4 oz (76.613 kg)] 168 lb 14.4 oz (76.613 kg) (05/17 0651) Last BM Date: 08/02/13 General: alert and oriented times 3 Heart: regular rate and rythm Abdomen: soft, non-tender, non-distended, normal bowel sounds   Lab Results:  Recent Labs  08/02/13 0514 08/02/13 2025 08/03/13 0510  WBC 5.0  --  6.3  HGB 6.6* 8.4* 8.4*  PLT 386  --  376  MCV 84.1  --  82.4    Recent Labs  08/02/13 0514 08/03/13 0510  NA 139 134*  K 4.5 4.5  CL 104 99  CO2 23 23  GLUCOSE 93 119*  BUN 24* 24*  CREATININE 1.16 1.23  CALCIUM 8.3* 8.7    Recent Labs  08/02/13 0514  PROT 6.9  ALBUMIN 2.8*  AST 77*  ALT 33  ALKPHOS 102  BILITOT 0.3    Recent Labs  08/02/13 0808  INR 1.06    Medications: Scheduled Meds: . amLODipine  5 mg Oral Daily  . atorvastatin  20 mg Oral q1800  . carvedilol  3.125 mg Oral BID WC  . divalproex  500 mg Oral BID  . ferrous sulfate  325 mg Oral BID WC  . furosemide  60 mg Oral Daily  . gabapentin  100 mg Oral BID  . isosorbide-hydrALAZINE  1 tablet Oral TID  . lisinopril  20 mg Oral BID  . mupirocin ointment  1 application Nasal BID  . pantoprazole (PROTONIX) IV  40 mg Intravenous Q12H  . phenytoin  200 mg Oral Daily  . phenytoin  300 mg Oral QHS  . potassium chloride  10 mEq Oral Daily  . sodium chloride  3 mL Intravenous Q12H  . sodium chloride  3 mL Intravenous Q12H  . spironolactone  50 mg Oral Daily   Continuous Infusions:  PRN Meds:.sodium chloride, diphenhydrAMINE, guaiFENesin-dextromethorphan, ondansetron (ZOFRAN)  IV, ondansetron, oxyCODONE, polyethylene glycol, sodium chloride    Assessment/Plan: 68 y.o. 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  Will proceed with EGD/Enteroscopy, MAC sedation given cocaine + tox screen (again).  Monday with anesthesia support.  Will allow him to eat today, NPO after MN.   Milus Banister, MD  08/03/2013, 7:15 AM [facility] Pager [phone]

## 2013-08-03 NOTE — Progress Notes (Signed)
[facility] PROGRESS NOTE  Charles [name] SEG:[id number] DOB: Sep 23, 1945 DOA: 08/02/2013 PCP: [name]  Assessment/Plan: 68 y.o. male, history of ischemic cardiomyopathy status post CABG, chronic diastolic and systolic heart failure EF of 50% on recent echo which is improved from 35% before, polysubstance abuse including cocaine says quit several months ago, hypertension, iron deficiency anemia, dyslipidemia, seizure disorder, PAD, chronic bilateral lower extremity pain and ache who lives in [facility] and presented to the ER with atypical chest discomfort which started 2 days ago found to have anemia   1. Anemia; ? GI loss; h/o upper GI AVM bleed in 2012,  -TF sed two units; Hg improved; no s/s of acute bleeding; endoscopy planned per GI    2. CAD with ischemic cardiomyopathy. Some atypical chest pain,  -chest pain resolved; trop negative; cont monitor  3. Chronic combined systolic and diastolic heart failure EF now improved 50% to 55%.  -clinically compensated cont Lasix, ACE, BB; daily weight, I/O   4. History of seizures. Continue home dose of Depakote and Dilantin at home dose.  5. PAD with lateral lower extremity chronic pain. Continue Neurontin, will repeat ABI. Continue statin, once stable from anemia standpoint may add low-dose aspirin for secondary prevention.  6. Dyslipidemia. Continue statin.  7. Hypertension. Continue Coreg, Norvasc, Bidil, Lasix and Aldactone.  8. History of polysubstance abuse including cocaine abuse. drinks occasional alcohol and smokes occasional cigarette; urine tox +, reports using cocaine;  Counseled to abstain from all.  9. Mildly prolonged QTC. s/p 1 g of mag, continue beta blocker, EF greater than 50%, telemetry monitor.      Code Status: full Family Communication:  D/w patient, offered to update teh family; he declined, says "they already know, I am in  The hospital" (indicate person spoken with, relationship, and if by phone, the  number) Disposition Plan: home 24-48 hours pend GI work up    Consultants:  GI  Procedures:  Pend endoscopy   Antibiotics:  none (indicate start date, and stop date if known)  HPI/Subjective: alert  Objective: Filed Vitals:   08/03/13 0553  BP: 170/71  Pulse: 80  Temp: 98.3 F (36.8 C)  Resp: 16    Intake/Output Summary (Last 24 hours) at 08/03/13 1036 Last data filed at 08/03/13 0900  Gross per 24 hour  Intake 2030.5 ml  Output   2550 ml  Net -519.5 ml   Filed Weights   08/02/13 0507 08/03/13 0651  Weight: 72.576 kg (160 lb) 76.613 kg (168 lb 14.4 oz)    Exam:   General:  alert  Cardiovascular: s1,s2 rrr  Respiratory: CTA BL  Abdomen: soft, nt,nd   Musculoskeletal: mild LE edema   Data Reviewed: Basic Metabolic Panel:  Recent Labs Lab 08/02/13 0514 08/03/13 0510  NA 139 134*  K 4.5 4.5  CL 104 99  CO2 23 23  GLUCOSE 93 119*  BUN 24* 24*  CREATININE 1.16 1.23  CALCIUM 8.3* 8.7   Liver Function Tests:  Recent Labs Lab 08/02/13 0514  AST 77*  ALT 33  ALKPHOS 102  BILITOT 0.3  PROT 6.9  ALBUMIN 2.8*   No results found for this basename: LIPASE, AMYLASE,  in the last 168 hours No results found for this basename: AMMONIA,  in the last 168 hours CBC:  Recent Labs Lab 08/02/13 0514 08/02/13 2025 08/03/13 0510  WBC 5.0  --  6.3  NEUTROABS 3.2  --   --   HGB 6.6* 8.4* 8.4*  HCT 21.7*  26.5* 26.7*  MCV 84.1  --  82.4  PLT 386  --  376   Cardiac Enzymes:  Recent Labs Lab 08/02/13 0808 08/02/13 1502 08/02/13 2005  TROPONINI <0.30  <0.30 <0.30 <0.30   BNP (last 3 results)  Recent Labs  07/15/13 2130 07/19/13 0430 08/02/13 0808  PROBNP 4293.0* 981.3* 1930.0*   CBG: No results found for this basename: GLUCAP,  in the last 168 hours  No results found for this or any previous visit (from the past 240 hour(s)).   Studies: Dg Chest Portable 1 View  08/02/2013   CLINICAL DATA:  Chest pain  EXAM: PORTABLE CHEST - 1  VIEW  COMPARISON:  07/15/2013  FINDINGS: Stable moderate cardiomegaly. Status post CABG. Mild pulmonary hyperinflation with chronic interstitial coarsening. No edema or consolidation. No effusion or pneumothorax.  IMPRESSION: Cardiomegaly and pulmonary venous congestion.   Electronically Signed   By: Jorje Guild M.D.   On: 08/02/2013 08:33    Scheduled Meds: . amLODipine  5 mg Oral Daily  . atorvastatin  20 mg Oral q1800  . carvedilol  3.125 mg Oral BID WC  . divalproex  500 mg Oral BID  . ferrous sulfate  325 mg Oral BID WC  . furosemide  60 mg Oral Daily  . gabapentin  100 mg Oral BID  . isosorbide-hydrALAZINE  1 tablet Oral TID  . lisinopril  20 mg Oral BID  . mupirocin ointment  1 application Nasal BID  . pantoprazole (PROTONIX) IV  40 mg Intravenous Q12H  . phenytoin  200 mg Oral Daily  . phenytoin  300 mg Oral QHS  . potassium chloride  10 mEq Oral Daily  . sodium chloride  3 mL Intravenous Q12H  . sodium chloride  3 mL Intravenous Q12H  . spironolactone  50 mg Oral Daily   Continuous Infusions:   Principal Problem:   GI bleed Active Problems:   Iron deficiency anemia   Chronic systolic heart failure   CAD (coronary artery disease)   Ischemic cardiomyopathy   Moderate to severe pulmonary hypertension   Polysubstance abuse   Renal artery stenosis   Peripheral vascular disease   Seizure disorder   Leg pain, bilateral   Chest pain at rest   Anemia    Time spent: >35 minutes     [name]  [facility] Pager [phone]. If 7PM-7AM, please contact night-coverage at [email or web address], password [facility] 08/03/2013, 10:36 AM  LOS: 1 day

## 2013-08-03 NOTE — Progress Notes (Signed)
Patient discharged home today and told nursing staff that he was told that the social worker would give him a taxi voucher to go home. CSW went back to see patient to determine if he had anyone who could come and get him. He was very upset about this and stated that he did a ride "later" but wanted to go now and that he was promised a taxi. Patient could not tell CSW who promised him a taxi ride.  CSW offered to call family or friends to assist with a ride; also offered a bus pas as patient was up and ambulating in the hallway. Patient became very upset with CSW again and stated "never mind and get out."  As CSW was leaving his room- he dialed someone and told them to come and pick him up.  CSW related to his nurse that he had located a ride home and CSW signing off. Lorie Phenix. [place], [place]

## 2013-08-03 NOTE — Progress Notes (Signed)
Utilization Review Completed.  

## 2013-08-04 ENCOUNTER — Encounter (HOSPITAL_COMMUNITY): Payer: Medicare Other | Admitting: Certified Registered Nurse Anesthetist

## 2013-08-04 ENCOUNTER — Encounter (HOSPITAL_COMMUNITY)
Admission: EM | Disposition: A | Discharge status: Home Health | Payer: Medicare Other | Source: Home / Self Care | Attending: Internal Medicine

## 2013-08-04 ENCOUNTER — Inpatient Hospital Stay (HOSPITAL_COMMUNITY): Payer: Medicare Other | Admitting: Certified Registered Nurse Anesthetist

## 2013-08-04 ENCOUNTER — Encounter (HOSPITAL_COMMUNITY): Payer: Self-pay | Admitting: Anesthesiology

## 2013-08-04 DIAGNOSIS — F1910 Other psychoactive substance abuse, uncomplicated: Secondary | ICD-10-CM

## 2013-08-04 DIAGNOSIS — Q2733 Arteriovenous malformation of digestive system vessel: Secondary | ICD-10-CM

## 2013-08-04 DIAGNOSIS — K31819 Angiodysplasia of stomach and duodenum without bleeding: Secondary | ICD-10-CM

## 2013-08-04 DIAGNOSIS — D649 Anemia, unspecified: Secondary | ICD-10-CM

## 2013-08-04 HISTORY — PX: ESOPHAGOGASTRODUODENOSCOPY (EGD) WITH PROPOFOL: SHX5813

## 2013-08-04 LAB — CBC
HEMATOCRIT: 28.8 % — AB (ref 39.0–52.0)
HEMOGLOBIN: 8.9 g/dL — AB (ref 13.0–17.0)
MCH: 25.7 pg — ABNORMAL LOW (ref 26.0–34.0)
MCHC: 30.9 g/dL (ref 30.0–36.0)
MCV: 83.2 fL (ref 78.0–100.0)
Platelets: 391 10*3/uL (ref 150–400)
RBC: 3.46 MIL/uL — ABNORMAL LOW (ref 4.22–5.81)
RDW: 19.3 % — ABNORMAL HIGH (ref 11.5–15.5)
WBC: 6.3 10*3/uL (ref 4.0–10.5)

## 2013-08-04 SURGERY — ESOPHAGOGASTRODUODENOSCOPY (EGD) WITH PROPOFOL
Anesthesia: Monitor Anesthesia Care

## 2013-08-04 MED ORDER — CARVEDILOL 3.125 MG PO TABS: 3.1250 mg | ORAL_TABLET | Freq: Two times a day (BID) | ORAL | Status: DC

## 2013-08-04 MED ORDER — PROPOFOL 10 MG/ML IV BOLUS
INTRAVENOUS | Status: DC | PRN
  Administered 2013-08-04 (×5): 20 mg via INTRAVENOUS

## 2013-08-04 MED ORDER — FENTANYL CITRATE 0.05 MG/ML IJ SOLN: 25.0000 ug | INTRAMUSCULAR | Status: DC | PRN

## 2013-08-04 MED ORDER — ASPIRIN EC 81 MG PO TBEC: 81.0000 mg | DELAYED_RELEASE_TABLET | Freq: Every day | ORAL | Status: DC

## 2013-08-04 MED ORDER — METOCLOPRAMIDE HCL 5 MG/ML IJ SOLN
10.0000 mg | Freq: Once | INTRAMUSCULAR | Status: DC | PRN
  Filled 2013-08-04: qty 2

## 2013-08-04 MED ORDER — PANTOPRAZOLE SODIUM 40 MG PO TBEC: 40.0000 mg | DELAYED_RELEASE_TABLET | Freq: Two times a day (BID) | ORAL | Status: DC

## 2013-08-04 MED ORDER — FUROSEMIDE 40 MG PO TABS: 40.0000 mg | ORAL_TABLET | Freq: Every day | ORAL | Status: DC

## 2013-08-04 MED ORDER — SODIUM CHLORIDE 0.9 % IV SOLN
INTRAVENOUS | Status: DC
  Administered 2013-08-04: 11:00:00 via INTRAVENOUS

## 2013-08-04 MED ORDER — AMLODIPINE BESYLATE 5 MG PO TABS: 5.0000 mg | ORAL_TABLET | Freq: Every day | ORAL | Status: DC

## 2013-08-04 MED ORDER — BUTAMBEN-TETRACAINE-BENZOCAINE 2-2-14 % EX AERO
INHALATION_SPRAY | CUTANEOUS | Status: DC | PRN
  Administered 2013-08-04: 2 via TOPICAL

## 2013-08-04 MED ORDER — SPIRONOLACTONE 50 MG PO TABS: 50.0000 mg | ORAL_TABLET | Freq: Every day | ORAL | Status: DC

## 2013-08-04 MED ORDER — PHENYTOIN SODIUM EXTENDED 100 MG PO CAPS: 200.0000 mg | ORAL_CAPSULE | Freq: Two times a day (BID) | ORAL | Status: DC

## 2013-08-04 MED ORDER — HYDRALAZINE HCL 100 MG PO TABS: 100.0000 mg | ORAL_TABLET | Freq: Three times a day (TID) | ORAL | Status: DC

## 2013-08-04 MED ORDER — LISINOPRIL 20 MG PO TABS: 20.0000 mg | ORAL_TABLET | Freq: Two times a day (BID) | ORAL | Status: DC

## 2013-08-04 MED ORDER — LIDOCAINE HCL (CARDIAC) 20 MG/ML IV SOLN
INTRAVENOUS | Status: DC | PRN
  Administered 2013-08-04: 80 mg via INTRAVENOUS

## 2013-08-04 MED ORDER — MEPERIDINE HCL 100 MG/ML IJ SOLN: 6.2500 mg | INTRAMUSCULAR | Status: DC | PRN

## 2013-08-04 MED ORDER — SODIUM CHLORIDE 0.9 % IV SOLN
INTRAVENOUS | Status: DC | PRN
  Administered 2013-08-04: 10:00:00 via INTRAVENOUS

## 2013-08-04 NOTE — Progress Notes (Signed)
          Daily Rounding Note  08/04/2013, 8:22 AM  LOS: 2 days   SUBJECTIVE:       No nausea, no abdominal pain.  Pain in legs from knees to feet persists.   OBJECTIVE:         Vital signs in last 24 hours:    Temp:  [98 F (36.7 C)-98.3 F (36.8 C)] 98.3 F (36.8 C) (05/18 0506) Pulse Rate:  [68-79] 73 (05/18 0506) Resp:  [16-18] 16 (05/18 0506) BP: (124-160)/(56-68) 160/67 mmHg (05/18 0506) SpO2:  [96 %-100 %] 96 % (05/18 0506) Last BM Date: 08/02/13 General: looks well, comfortable alert   Did not reexamine.   Intake/Output from previous day: 05/17 0701 - 05/18 0700 In: 680 [P.O.:680] Out: 2025 [Urine:2025]  Intake/Output this shift:    Lab Results:  Recent Labs  08/02/13 0514 08/02/13 2025 08/03/13 0510 08/04/13 0445  WBC 5.0  --  6.3 6.3  HGB 6.6* 8.4* 8.4* 8.9*  HCT 21.7* 26.5* 26.7* 28.8*  PLT 386  --  376 391  MCV    83  BMET  Recent Labs  08/02/13 0514 08/03/13 0510  NA 139 134*  K 4.5 4.5  CL 104 99  CO2 23 23  GLUCOSE 93 119*  BUN 24* 24*  CREATININE 1.16 1.23  CALCIUM 8.3* 8.7   LFT  Recent Labs  08/02/13 0514  PROT 6.9  ALBUMIN 2.8*  AST 77*  ALT 33  ALKPHOS 102  BILITOT 0.3   PT/INR  Recent Labs  08/02/13 0808  LABPROT 13.6  INR 1.06    Studies/Results: Dg Chest Portable 1 View  08/02/2013   CLINICAL DATA:  Chest pain  EXAM: PORTABLE CHEST - 1 VIEW  COMPARISON:  07/15/2013  FINDINGS: Stable moderate cardiomegaly. Status post CABG. Mild pulmonary hyperinflation with chronic interstitial coarsening. No edema or consolidation. No effusion or pneumothorax.  IMPRESSION: Cardiomegaly and pulmonary venous congestion.   Electronically Signed   By: Jorje Guild M.D.   On: 08/02/2013 08:33    ASSESMENT:   *  GI bleed, black stools. Hx AVMs SB 2012, gastric 2014. Hemorrhoids on 03/2010 colonoscopy.   *  Acute on chronic anemia. Confirmed IDA per labs.  On po Iron PTA.   S/p PRBC x 2 with appropriated jump in Hgb. Bone marrow biopsy 2012.   *  Chronic heart failure: diastolic and systolic. Ischemic CM.  2009 3v CABG  *  Substance abuse. + cocaine on tox screen.    *  OSA.  Not on CPAP  *  Stage 3 CKD   [name]   *  EGD this AM.    [name]  08/04/2013, 8:22 AM Pager: [phone]     Attending physician's note   I have taken an interval history, reviewed the chart and examined the patient. I agree with the Advanced Practitioner's note, impression and recommendations. Melena with acute on chronic anemia and history of gastric and jejunal AVMs  Charles Wisdom T. Fuller Plan, MD [facility]

## 2013-08-04 NOTE — Interval H&P Note (Signed)
History and Physical Interval Note:  08/04/2013 9:08 AM  Charles [name]  has presented today for surgery, with the diagnosis of gi bleeding  The various methods of treatment have been discussed with the patient and family. After consideration of risks, benefits and other options for treatment, the patient has consented to  Procedure(s): ESOPHAGOGASTRODUODENOSCOPY (EGD) WITH PROPOFOL (N/A) as a surgical intervention .  The patient's history has been reviewed, patient examined, no change in status, stable for surgery.  I have reviewed the patient's chart and labs.  Questions were answered to the patient's satisfaction.     [name] MD

## 2013-08-04 NOTE — Anesthesia Preprocedure Evaluation (Signed)
Anesthesia Evaluation  Patient identified by MRN, date of birth, ID band Patient awake    Reviewed: Allergy & Precautions, H&P , NPO status , Patient's Chart, lab work & pertinent test results  Airway Mallampati: III TM Distance: >3 FB Neck ROM: Full    Dental no notable dental hx. (+) Teeth Intact   Pulmonary sleep apnea , Current Smoker,  breath sounds clear to auscultation  Pulmonary exam normal       Cardiovascular hypertension, Pt. on medications + angina with exertion + CAD, + Past MI, + Peripheral Vascular Disease and +CHF Rhythm:Regular Rate:Normal + Systolic murmurs    Neuro/Psych Seizures -,  negative psych ROS   GI/Hepatic (+)     substance abuse  alcohol use, cocaine use and marijuana use, GI bleeding   Endo/Other  diabetes, Poorly Controlled, Type 2, Oral Hypoglycemic AgentsHyperlipidemia  Renal/GU Renal diseaseRenal Artery stenosis   negative genitourinary   Musculoskeletal  (+) Arthritis -, Osteoarthritis,    Abdominal (+) - obese,   Peds  Hematology  (+) anemia ,   Anesthesia Other Findings   Reproductive/Obstetrics                           Anesthesia Physical Anesthesia Plan  ASA: III  Anesthesia Plan: MAC   Post-op Pain Management:    Induction: Intravenous  Airway Management Planned: Nasal Cannula  Additional Equipment:   Intra-op Plan:   Post-operative Plan:   Informed Consent: I have reviewed the patients History and Physical, chart, labs and discussed the procedure including the risks, benefits and alternatives for the proposed anesthesia with the patient or authorized representative who has indicated his/her understanding and acceptance.     Plan Discussed with: Anesthesiologist, CRNA and Surgeon  Anesthesia Plan Comments:         Anesthesia Quick Evaluation

## 2013-08-04 NOTE — Anesthesia Postprocedure Evaluation (Signed)
  Anesthesia Post-op Note  Patient: [name]  Procedure(s) Performed: Procedure(s): ESOPHAGOGASTRODUODENOSCOPY (EGD) WITH PROPOFOL (N/A)  Patient Location: PACU  Anesthesia Type:MAC  Level of Consciousness: awake, alert  and oriented  Airway and Oxygen Therapy: Patient Spontanous Breathing  Post-op Pain: none  Post-op Assessment: Post-op Vital signs reviewed, Patient's Cardiovascular Status Stable, Respiratory Function Stable, Patent Airway, No signs of Nausea or vomiting and Pain level controlled  Post-op Vital Signs: Reviewed and stable  Last Vitals:  Filed Vitals:   08/04/13 1014  BP: 150/101  Pulse: 126  Temp: 36.4 C  Resp: 17    Complications: No apparent anesthesia complications

## 2013-08-04 NOTE — Care Management Note (Signed)
    Page 1 of 1   08/04/2013     2:37:22 PM CARE MANAGEMENT NOTE 08/04/2013  Patient:  Charles [name], Charles [name]   Account Number:  [id number]  Date Initiated:  08/04/2013  Documentation initiated by:  GRAVES-BIGELOW,Char Feltman  Subjective/Objective Assessment:   Pt admitted for GI bleed. Pt lives in [place].     Action/Plan:   PT recomends HHPT services. Per pt he is refusing [place] services.   Anticipated DC Date:  08/04/2013   Anticipated DC Plan:  [place]  CM consult  Patient refused services      Choice offered to / List presented to:             Status of service:  Completed, signed off Medicare Important Message given?   (If response is "NO", the following Medicare IM given date fields will be blank) Date Medicare IM given:   Date Additional Medicare IM given:    Discharge Disposition:  HOME/SELF CARE  Per UR Regulation:  Reviewed for med. necessity/level of care/duration of stay  If discussed at [place] of Stay Meetings, dates discussed:    Comments:

## 2013-08-04 NOTE — Progress Notes (Signed)
PT Cancellation Note  Patient Details Name: Colt Martelle MRN: [id number] DOB: 27-Feb-1946   Cancelled Treatment:    Reason Eval/Treat Not Completed: Patient at procedure or test/unavailable   Sherol Sabas B Estefano Victory 08/04/2013, 8:42 AM [place] Re[place], [place]

## 2013-08-04 NOTE — Discharge Summary (Addendum)
Physician Discharge Summary  Charles [name] [id number] DOB: 1945/05/30 DOA: 08/02/2013  PCP: [name]  Admit date: 08/02/2013 Discharge date: 08/04/2013  Time spent: >35 minutes  minutes  Recommendations for Outpatient Follow-up:  F/u with PCP in 1-2 week s F/u with PCP 3-4 week Discharge Diagnoses:  Principal Problem:   GI bleed Active Problems:   Iron deficiency anemia   Chronic systolic heart failure   CAD (coronary artery disease)   Ischemic cardiomyopathy   Moderate to severe pulmonary hypertension   Polysubstance abuse   Renal artery stenosis   Peripheral vascular disease   Seizure disorder   Leg pain, bilateral   Chest pain at rest   Anemia   Gastric AVM   Discharge Condition: stable   Diet recommendation: low sodium   Filed Weights   08/02/13 0507 08/03/13 0651  Weight: 72.576 kg (160 lb) 76.613 kg (168 lb 14.4 oz)    History of present illness:  68 y.o. male, history of ischemic cardiomyopathy status post CABG, chronic diastolic and systolic heart failure EF of 50% on recent echo which is improved from 35% before, polysubstance abuse including cocaine says quit several months ago, hypertension, iron deficiency anemia, dyslipidemia, seizure disorder, PAD, chronic bilateral lower extremity pain and ache who lives in [facility] and presented to the ER with atypical chest discomfort which started 2 days ago found to have anemia   Hospital Course:  1. Anemia; ? GI loss; h/o upper GI AVM bleed in 2012,  -TF sed two units; Hg improved; no s/s of acute bleeding;  -5/18: s/p EGD: AVM no active bleeding s/p ablation; recommended to hold NSAIDS, hold ASA 10 days  2. CAD with ischemic cardiomyopathy. Some atypical chest pain,  -chest pain resolved; trop negative; resume home regimen, hold ASA for 10 days  3. Chronic combined systolic and diastolic heart failure EF now improved 50% to 55%.  -clinically compensated cont Lasix, ACE, BB; 4. History of seizures.  Continue home dose of Depakote and Dilantin at home dose.  5. PAD with lateral lower extremity chronic pain. Continue Neurontin;  Continue statin, OP f/ui with ABI;  -no s/s of acute ischemia  6. Dyslipidemia. Continue statin.  7. Hypertension. Continue Coreg, Norvasc, Bidil, Lasix and Aldactone. Post procedure is high; resume PO meds/recheck outpatient titration;  8. History of polysubstance abuse including cocaine abuse. drinks occasional alcohol and smokes occasional cigarette; urine tox +, reports using cocaine; Counseled to abstain from all.  9. Mildly prolonged QTC. s/p 1 g of mag, continue beta blocker, EF greater than 50%, telemetry no significant arrythmia     Procedures:  EGD (i.e. Studies not automatically included, echos, thoracentesis, etc; not x-rays)  Consultations:  GI  Discharge Exam: Filed Vitals:   08/04/13 1045  BP: 185/85  Pulse: 66  Temp:   Resp: 25    General: alert Cardiovascular: s1,s2 rrr Respiratory: CTA BL  Discharge Instructions  Discharge Instructions   Diet - low sodium heart healthy    Complete by:  As directed      Discharge instructions    Complete by:  As directed   Please follow up with primary care doctor in 1 week  Please follow up with gastroenterologist in 2-3 weeks     Increase activity slowly    Complete by:  As directed             Medication List         amLODipine 5 MG tablet  Commonly known as:  NORVASC  Take 1 tablet (5 mg total) by mouth daily.     aspirin EC 81 MG tablet  Take 1 tablet (81 mg total) by mouth daily.  Start taking on:  08/18/2013     carvedilol 3.125 MG tablet  Commonly known as:  COREG  Take 1 tablet (3.125 mg total) by mouth 2 (two) times daily with a meal.     cloNIDine 0.2 MG tablet  Commonly known as:  CATAPRES  Take 0.2 mg by mouth daily.     divalproex 500 MG DR tablet  Commonly known as:  DEPAKOTE  Take 1 tablet (500 mg total) by mouth 2 (two) times daily.     ferrous sulfate 325  (65 FE) MG tablet  Take 1 tablet (325 mg total) by mouth 2 (two) times daily with a meal.     furosemide 40 MG tablet  Commonly known as:  LASIX  Take 1 tablet (40 mg total) by mouth daily.     gabapentin 100 MG capsule  Commonly known as:  NEURONTIN  Take 1 capsule (100 mg total) by mouth 2 (two) times daily.     hydrALAZINE 100 MG tablet  Commonly known as:  APRESOLINE  Take 1 tablet (100 mg total) by mouth 3 (three) times daily.     lisinopril 20 MG tablet  Commonly known as:  PRINIVIL,ZESTRIL  Take 1 tablet (20 mg total) by mouth 2 (two) times daily.     pantoprazole 40 MG tablet  Commonly known as:  PROTONIX  Take 1 tablet (40 mg total) by mouth 2 (two) times daily.     phenytoin 100 MG ER capsule  Commonly known as:  DILANTIN  Take 2-3 capsules (200-300 mg total) by mouth 2 (two) times daily. Take 2 capsules by mouth in the morning and 3 capsules by mouth at bedtime.     potassium chloride 10 MEQ tablet  Commonly known as:  K-DUR  Take 1 tablet (10 mEq total) by mouth daily.     simvastatin 40 MG tablet  Commonly known as:  ZOCOR  Take 1 tablet (40 mg total) by mouth every evening.     spironolactone 50 MG tablet  Commonly known as:  ALDACTONE  Take 1 tablet (50 mg total) by mouth daily.     tiZANidine 2 MG tablet  Commonly known as:  ZANAFLEX  Take 2 mg by mouth daily.       Allergies  Allergen Reactions  . Motrin [Ibuprofen] Other (See Comments)    Affects kidneys  . Tylenol [Acetaminophen] Other (See Comments)    Affects kidneys       Follow-up Information   Follow up with [name]. Schedule an appointment as soon as possible for a visit in 1 week.   Specialty:  Family Medicine   Contact information:   [address], [address] [facility] [place] [address] [phone]        The results of significant diagnostics from this hospitalization (including imaging, microbiology, ancillary and laboratory) are  listed below for reference.    Significant Diagnostic Studies: Dg Chest 2 View  07/15/2013   CLINICAL DATA:  Shortness of breath and lower extremity edema at  EXAM: CHEST  2 VIEW  COMPARISON:  DG CHEST 1V PORT dated 03/21/2013  FINDINGS: The cardiopericardial silhouette remains enlarged. The pulmonary vascularity is more engorged and less distinct today than on the previous study. The pulmonary interstitial markings also are mildly increased especially  on the right. There is no significant pleural effusion. The patient has undergone previous CABG.  IMPRESSION: The findings are consistent with CHF with mild pulmonary interstitial edema.   Electronically Signed   By: David  Martinique   On: 07/15/2013 23:27   Ct Angio Ao+bifem W/cm &/or Wo/cm  07/16/2013   CLINICAL DATA:  Left leg pain and swelling.  EXAM: CT ANGIOGRAPHY OF ABDOMINAL AORTA WITH ILIOFEMORAL RUNOFF  TECHNIQUE: Multidetector CT imaging of the abdomen, pelvis and lower extremities was performed using the standard protocol during bolus administration of intravenous contrast. Multiplanar CT image reconstructions and MIPs were obtained to evaluate the vascular anatomy.  CONTRAST:  182mL OMNIPAQUE IOHEXOL 350 MG/ML SOLN  COMPARISON:  CT of the abdomen and pelvis from 03/24/2010, and abdominal ultrasound performed 04/18/2010  FINDINGS: Aorta: Relatively diffuse calcification is seen along the abdominal aorta and its branches. The celiac trunk, superior mesenteric artery, bilateral renal arteries and inferior mesenteric artery remain patent, with mild calcific atherosclerotic disease seen along the superior mesenteric artery and bilateral renal arteries. There is likely moderate luminal narrowing at the proximal right common iliac artery, and severe luminal narrowing at the proximal left common iliac artery.  Calcific atherosclerotic disease is somewhat worse at the left common iliac artery, with additional moderate to severe luminal narrowing along its  course. There is also moderate to severe luminal narrowing at the bifurcation of the left common iliac artery. Flow is still seen to the level of the common femoral arteries bilaterally.  Right Lower Extremity: Scattered calcific atherosclerotic disease is noted along the visualized vasculature. There is likely mild to moderate luminal narrowing involving the proximal profunda femoris. The right superficial femoral artery appears to occlude at the level of the mid thigh, though it reconstitutes more distally, just proximal to the level of the knee. This may reflect reconstitution of flow from collateral vessels, or trace nonvisualized flow along the apparently occluded segment.  Calcific atherosclerotic disease is noted along the distal popliteal artery. There is apparent three vessel runoff to the right ankle, though the posterior tibial artery is less well characterized just proximal to the level of the ankle.  Left Lower Extremity: Scattered calcific atherosclerotic disease is noted along the visualized vasculature. There is likely moderate luminal narrowing involving the proximal profunda femoris. There is apparent occlusion of the left superficial femoral artery at the level of the mid thigh, as on the right, with reconstitution noted just proximal to the level of the knee. As on the right, this may reflect reconstitution of flow from collateral vessels, with trace nonvisualized flow along the apparently occluded segment.  There is mild scattered calcific atherosclerotic disease along the proximal branches of the left popliteal artery. Patent three vessel runoff is seen to the level of the ankle.  Additional Findings:  A trace left pleural effusion is noted.  Trace ascites is noted surrounding the liver, tracking along the right paracolic gutter.  The liver and spleen are grossly unremarkable in appearance. The gallbladder is within normal limits. The pancreas and adrenal glands are unremarkable.  Nonspecific  perinephric stranding is noted bilaterally. The kidneys are otherwise unremarkable in appearance. There is no evidence of hydronephrosis. No renal or ureteral stones are seen.  No free fluid is identified. The small bowel is unremarkable in appearance. The stomach is within normal limits. No acute vascular abnormalities are seen.  The appendix is normal in caliber and contains air, without evidence for appendicitis. The colon is unremarkable in appearance.  The  bladder is mildly distended. Mild bladder wall thickening is nonspecific and may be chronic in nature. The prostate remains normal in size. No inguinal lymphadenopathy is seen.  Note is made of diffuse bilateral lower extremity edema, slightly worse on the left. This is relatively superficial in nature. No underlying focal fluid collection is seen. Edema resolves at the level of the ankles, though trace edema is noted at both feet.  No acute osseous abnormalities are identified. Multilevel vacuum phenomenon is noted at the lower lumbar spine.  Review of the MIP images confirms the above findings.  IMPRESSION: 1. Vascular findings as described above. 2. The superficial femoral arteries appear to exclude bilaterally at the level of the mid thighs, though they reconstitute more distally, just proximal to the level of the knees. This may reflect reconstitution of flow from collateral vessels, or trace nonvisualized flow along the apparently occluded segments. 3. Calcific atherosclerotic disease is somewhat worsened at the left common iliac artery, with moderate luminal narrowing at the proximal right common iliac artery, and severe luminal narrowing at multiple locations along the left common iliac artery. 4. Following reconstitution of the popliteal arteries, there appears to be patent three vessel runoff to both lower extremities, though the right posterior tibial artery is difficult to fully characterize just proximal to the level of the ankle. 5. Diffuse  bilateral lower extremity edema, slightly worse on the left. This is relatively superficial in nature. No underlying focal fluid collection seen. Edema resolves at the level of the ankles, though trace edema is seen at both feet. 6. Trace ascites noted surrounding the liver, tracking along the right paracolic gutter. 7. Trace left pleural effusion noted. 8. Mild bladder wall thickening is nonspecific and may be chronic in nature.   Electronically Signed   By: Garald Balding M.D.   On: 07/16/2013 02:46   Nm Pulmonary Perf And Vent  07/17/2013   CLINICAL DATA:  Right leg pain and swelling. Question pulmonary embolus.  EXAM: NUCLEAR MEDICINE VENTILATION - PERFUSION LUNG SCAN  TECHNIQUE: Ventilation images were obtained in multiple projections using inhaled aerosol technetium 99 M DTPA. Perfusion images were obtained in multiple projections after intravenous injection of Tc-47m MAA.  RADIOPHARMACEUTICALS:  40 mCi Tc-24m DTPA aerosol and 6 mCi Tc-77m MAA  COMPARISON:  PA and lateral chest 07/15/2013.  CT scan 04/08/2010.  FINDINGS: Ventilation: No focal ventilation defect.  Perfusion: No wedge shaped peripheral perfusion defects to suggest acute pulmonary embolism.  IMPRESSION: Negative exam.   Electronically Signed   By: Inge Rise M.D.   On: 07/17/2013 12:32   Dg Chest Portable 1 View  08/02/2013   CLINICAL DATA:  Chest pain  EXAM: PORTABLE CHEST - 1 VIEW  COMPARISON:  07/15/2013  FINDINGS: Stable moderate cardiomegaly. Status post CABG. Mild pulmonary hyperinflation with chronic interstitial coarsening. No edema or consolidation. No effusion or pneumothorax.  IMPRESSION: Cardiomegaly and pulmonary venous congestion.   Electronically Signed   By: Jorje Guild M.D.   On: 08/02/2013 08:33    Microbiology: No results found for this or any previous visit (from the past 240 hour(s)).   Labs: Basic Metabolic Panel:  Recent Labs Lab 08/02/13 0514 08/03/13 0510  NA 139 134*  K 4.5 4.5  CL 104 99   CO2 23 23  GLUCOSE 93 119*  BUN 24* 24*  CREATININE 1.16 1.23  CALCIUM 8.3* 8.7   Liver Function Tests:  Recent Labs Lab 08/02/13 0514  AST 77*  ALT 33  ALKPHOS 102  BILITOT 0.3  PROT 6.9  ALBUMIN 2.8*   No results found for this basename: LIPASE, AMYLASE,  in the last 168 hours No results found for this basename: AMMONIA,  in the last 168 hours CBC:  Recent Labs Lab 08/02/13 0514 08/02/13 2025 08/03/13 0510 08/04/13 0445  WBC 5.0  --  6.3 6.3  NEUTROABS 3.2  --   --   --   HGB 6.6* 8.4* 8.4* 8.9*  HCT 21.7* 26.5* 26.7* 28.8*  MCV 84.1  --  82.4 83.2  PLT 386  --  376 391   Cardiac Enzymes:  Recent Labs Lab 08/02/13 0808 08/02/13 1502 08/02/13 2005  TROPONINI <0.30  <0.30 <0.30 <0.30   BNP: BNP (last 3 results)  Recent Labs  07/15/13 2130 07/19/13 0430 08/02/13 0808  PROBNP 4293.0* 981.3* 1930.0*   CBG: No results found for this basename: GLUCAP,  in the last 168 hours     Signed:  [name]  [facility] 08/04/2013, 2:00 PM

## 2013-08-04 NOTE — Op Note (Signed)
[facility] [place], [id number]   OPERATIVE PROCEDURE REPORT  PATIENT: Charles [name], Charles [name]  MR#: [id number] BIRTHDATE: 09/22/1945 , 67  yrs. old GENDER: Male ENDOSCOPIST: [name], MD, [facility] REFERRED BY:  [facility] PROCEDURE DATE: 08/04/2013 PROCEDURE:   Small bowel enteroscopy with control of bleeding ASA CLASS:   Class III INDICATIONS:1.  melenic bleeding. MEDICATIONS: MAC sedation, administered by CRNA and propofol (Diprivan) 170mg  IV TOPICAL ANESTHETIC:   none DESCRIPTION OF PROCEDURE:   After the risks benefits and alternatives of the procedure were thoroughly explained, informed consent was obtained.  The EC-[id number] ([id number]  endoscope was introduced through the mouth  and advanced to the proximal jejunum , limited by Without limitations.   The instrument was slowly withdrawn as the mucosa was fully examined.  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  It was non bleeding and measured 3 mm. APC ablation was performed.   The remainder of the small bowel enteroscopy exam to the proximal jejunum was normal.  Retroflexed views revealed no abnormalities.    The scope was then withdrawn from the patient and the procedure terminated.  COMPLICATIONS: There were no complications.  ENDOSCOPIC IMPRESSION: 1.   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 2.   The remainder of the small bowel enteroscopy exam was normal  RECOMMENDATIONS: 1.  avoid ASA/NSAIDS   eSigned:  [name], MD, [facility] 08/04/2013 10:13 AM   CC: Owens Loffler, MD

## 2013-08-04 NOTE — H&P (View-Only) (Signed)
Consultation  Referring Provider:      Primary Care Physician:  [name] Primary Gastroenterologist:         Reason for Consultation:              HPI:   Charles [name] is a 68 y.o. male with multiple medical problems and history of illicit drug use. Patient has recently been seen at [facility] ED several times for BLE edema and pain. He was just discharged from her on 5/4 after being treated for acute on chronic systolic and diastolic heart failure. Patient brought back to [facility] early this am for chest pain. He was found to have a hgb in 6 range, heme positive. No abdominal pain. Patient reports painless rectal bleeding a few days back. Blood initially bright red than changed to black. No bleeding in a few days but still some intermittent black stools (he takes iron). Patient takes a daily baby ASA, no other NSAIDS   Past Medical History  Diagnosis Date  . Iron deficiency anemia   . Hypertension   . Chronic systolic heart failure     2D Echo (03/2010) - EF 35-40% with akinesis of inferoposterior myocardium  . CAD (coronary artery disease)     s/p 3-vessel CABG (12/2007) // 100% RCA stenosis with collaterals from left system. Severe bifurcation lesions of proxima CXA and OM. Moderate LAD diseaseFollowed by Dr. Wyline Copas in [facility]  . Hyperlipidemia LDL goal <70   . Obstructive sleep apnea     not on home CPAP  . Ischemic cardiomyopathy   . Chronic kidney disease (CKD), stage III (moderate)     BL SCr 1.5-1.6  . Homelessness   . 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 // Colonoscopy and EGD in 03/2010 negative per report (record not found)  . 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 Polysubstance abuse     cocaine, THC  . AV malformation of gastrointestinal tract   . Family history of early CAD   . DDD  (degenerative disc disease), lumbar   . Renal artery stenosis   . Peripheral vascular disease   . Seizure disorder   . Diabetes   . Acute on chronic systolic CHF (congestive heart failure) 08/13/2012  . Myocardial infarction   . Seizures     Past Surgical History  Procedure Laterality Date  . Coronary artery bypass graft  12/2007  . Apc  03/2010    To treat small bowel AVMs  . Esophagogastroduodenoscopy N/A 08/14/2012    Procedure: ESOPHAGOGASTRODUODENOSCOPY (EGD);  Surgeon: [name], MD;  Location: [facility] ENDOSCOPY;  Service: Endoscopy;  Laterality: N/A;  . Hot hemostasis N/A 08/14/2012    Procedure: HOT HEMOSTASIS (ARGON PLASMA COAGULATION/BICAP);  Surgeon: [name], MD;  Location: [facility] ENDOSCOPY;  Service: Endoscopy;  Laterality: N/A;    Family History  Problem Relation Age of Onset  . Heart disease Mother     unknown type  . Heart disease Father 55    died of MI at 75yo  . Heart disease Paternal Grandfather 85    died of MI  . Heart disease    . Heart disease Brother 30   No colon cancer or other GI malignancies  History  Substance Use Topics  . Smoking status: Current Every Day Smoker --  0.20 packs/day for 13 years    Types: Cigarettes    Last Attempt to Quit: 03/20/1997  . Smokeless tobacco: Never Used  . Alcohol Use: Yes     Comment: occasionally drinks, a few times a month    Prior to Admission medications   Medication Sig Start Date End Date Taking? Authorizing Provider  carvedilol (COREG) 3.125 MG tablet Take 1 tablet (3.125 mg total) by mouth 2 (two) times daily with a meal. 07/21/13  Yes Charlynne Cousins, MD  divalproex (DEPAKOTE) 500 MG DR tablet Take 1 tablet (500 mg total) by mouth 2 (two) times daily. 03/13/13  Yes [name], MD  ferrous sulfate 325 (65 FE) MG tablet Take 1 tablet (325 mg total) by mouth 2 (two) times daily with a meal. 03/13/13  Yes [name], MD  furosemide (LASIX) 40 MG tablet Take 1.5 tablets (60 mg total) by mouth  daily. 07/23/13  Yes Charlynne Cousins, MD  gabapentin (NEURONTIN) 100 MG capsule Take 1 capsule (100 mg total) by mouth 2 (two) times daily. 07/21/13  Yes Charlynne Cousins, MD  isosorbide-hydrALAZINE (BIDIL) 20-37.5 MG per tablet Take 1 tablet by mouth 3 (three) times daily. 07/21/13  Yes Charlynne Cousins, MD  lisinopril (PRINIVIL,ZESTRIL) 20 MG tablet Take 1 tablet (20 mg total) by mouth 2 (two) times daily. 07/21/13  Yes Charlynne Cousins, MD  phenytoin (DILANTIN) 100 MG ER capsule Take 2-3 capsules (200-300 mg total) by mouth 2 (two) times daily. Take 2 capsules by mouth in the morning and 3 capsules by mouth at bedtime. 03/13/13  Yes [name], MD  amLODipine (NORVASC) 5 MG tablet Take 1 tablet (5 mg total) by mouth daily. 03/13/13   [name], MD  mupirocin ointment (BACTROBAN) 2 % Place 1 application into the nose 2 (two) times daily. 03/13/13   [name], MD  pantoprazole (PROTONIX) 40 MG tablet Take 1 tablet (40 mg total) by mouth 2 (two) times daily. 03/19/13   Verlee Monte, MD  potassium chloride (K-DUR) 10 MEQ tablet Take 1 tablet (10 mEq total) by mouth daily. 03/13/13   [name], MD  simvastatin (ZOCOR) 40 MG tablet Take 1 tablet (40 mg total) by mouth every evening. 03/13/13   [name], MD  spironolactone (ALDACTONE) 50 MG tablet Take 1 tablet (50 mg total) by mouth daily. 07/23/13   Charlynne Cousins, MD    Current Facility-Administered Medications  Medication Dose Route Frequency Provider Last Rate Last Dose  . diphenhydrAMINE (BENADRYL) injection 25 mg  25 mg Intravenous Q6H PRN Thurnell Lose, MD   25 mg at 08/02/13 0845  . furosemide (LASIX) injection 40 mg  40 mg Intravenous Once Thurnell Lose, MD      . magnesium sulfate IVPB 1 g 100 mL  1 g Intravenous Once Thurnell Lose, MD      . pantoprazole (PROTONIX) injection 40 mg  40 mg Intravenous Q12H Thurnell Lose, MD       Current Outpatient Prescriptions  Medication Sig  Dispense Refill  . carvedilol (COREG) 3.125 MG tablet Take 1 tablet (3.125 mg total) by mouth 2 (two) times daily with a meal.  60 tablet  0  . divalproex (DEPAKOTE) 500 MG DR tablet Take 1 tablet (500 mg total) by mouth 2 (two) times daily.  60 tablet  1  . ferrous sulfate 325 (65 FE) MG tablet Take 1 tablet (325 mg total) by mouth 2 (  two) times daily with a meal.  30 tablet  3  . furosemide (LASIX) 40 MG tablet Take 1.5 tablets (60 mg total) by mouth daily.  30 tablet  3  . gabapentin (NEURONTIN) 100 MG capsule Take 1 capsule (100 mg total) by mouth 2 (two) times daily.  60 capsule  2  . isosorbide-hydrALAZINE (BIDIL) 20-37.5 MG per tablet Take 1 tablet by mouth 3 (three) times daily.  90 tablet  2  . lisinopril (PRINIVIL,ZESTRIL) 20 MG tablet Take 1 tablet (20 mg total) by mouth 2 (two) times daily.  30 tablet  0  . phenytoin (DILANTIN) 100 MG ER capsule Take 2-3 capsules (200-300 mg total) by mouth 2 (two) times daily. Take 2 capsules by mouth in the morning and 3 capsules by mouth at bedtime.  90 capsule  2  . amLODipine (NORVASC) 5 MG tablet Take 1 tablet (5 mg total) by mouth daily.  30 tablet  2  . mupirocin ointment (BACTROBAN) 2 % Place 1 application into the nose 2 (two) times daily.  22 g  0  . pantoprazole (PROTONIX) 40 MG tablet Take 1 tablet (40 mg total) by mouth 2 (two) times daily.  60 tablet  0  . potassium chloride (K-DUR) 10 MEQ tablet Take 1 tablet (10 mEq total) by mouth daily.  30 tablet  1  . simvastatin (ZOCOR) 40 MG tablet Take 1 tablet (40 mg total) by mouth every evening.  30 tablet  1  . spironolactone (ALDACTONE) 50 MG tablet Take 1 tablet (50 mg total) by mouth daily.  30 tablet  2    Allergies as of 08/02/2013 - Review Complete 08/02/2013  Allergen Reaction Noted  . Motrin [ibuprofen] Other (See Comments) 05/16/2010  . Tylenol [acetaminophen] Other (See Comments) 05/16/2010    Review of Systems:    Positive for recent lower extremity swelling. All systems  reviewed and negative except where noted in HPI.   Physical Exam:  Vital signs in last 24 hours: Temp:  [98 F (36.7 C)] 98 F (36.7 C) (05/16 0850) Pulse Rate:  [71-84] 77 (05/16 0700) Resp:  [14-28] 28 (05/16 0700) BP: (142-174)/(75-98) 174/97 mmHg (05/16 0850) SpO2:  [99 %-100 %] 99 % (05/16 0700) Weight:  [160 lb (72.576 kg)] 160 lb (72.576 kg) (05/16 0507)   General:   Black male lying in bed uncomfortable with lower extremity pain.  Head: Normocephalic and atraumatic. Eyes:   No icterus.   Conjunctiva pink. Ears:  Normal auditory acuity. Neck:  Supple; no masses felt Lungs:  Respirations even and unlabored. Lungs clear to auscultation bilaterally.   No wheezes, crackles, or rhonchi.  Heart:  Regular rate and rhythm. Abdomen:  Soft, nondistended, nontender. Normal bowel sounds. No appreciable masses or hepatomegaly.  Rectal:  No blood or stool on DRE.  Msk:  Symmetrical without gross deformities.  Extremities:  Without edema. Neurologic:  Alert and  oriented x4;  grossly normal neurologically. Skin:  Intact without significant lesions or rashes. Cervical Nodes:  No significant cervical adenopathy. Psych:  Alert and cooperative. Normal affect.  LAB RESULTS:  Recent Labs  08/02/13 0514  WBC 5.0  HGB 6.6*  HCT 21.7*  PLT 386   BMET  Recent Labs  08/02/13 0514  NA 139  K 4.5  CL 104  CO2 23  GLUCOSE 93  BUN 24*  CREATININE 1.16  CALCIUM 8.3*   LFT  Recent Labs  08/02/13 0514  PROT 6.9  ALBUMIN 2.8*  AST 77*  ALT  33  ALKPHOS 102  BILITOT 0.3   PT/INR  Recent Labs  08/02/13 0808  LABPROT 13.6  INR 1.06    STUDIES: Dg Chest Portable 1 View  08/02/2013   CLINICAL DATA:  Chest pain  EXAM: PORTABLE CHEST - 1 VIEW  COMPARISON:  07/15/2013  FINDINGS: Stable moderate cardiomegaly. Status post CABG. Mild pulmonary hyperinflation with chronic interstitial coarsening. No edema or consolidation. No effusion or pneumothorax.  IMPRESSION: Cardiomegaly and  pulmonary venous congestion.   Electronically Signed   By: Jorje Guild M.D.   On: 08/02/2013 08:33    PREVIOUS ENDOSCOPIES:             GD with control of bleeding May 2014. Small patch of abnormal mucosa and bleeding in stomach. Area ablated   2012 workup for Heme+ stools and anemia.   EGD  Negative other than HH.   Enterosocopy 2012 for heme + stool / anemia and negative EGD /Colon. Findings 3 small bowel AVMs, treated with APC, 2 cm HH  Givens Capsule 2012. PROCEDURE INFO & FINDINGS: 1)COMPLETE STUDY, GOOD PREP 2)ACTIVE OOZING OF BLOOD IN THE PROXIMAL SMALL BOWEL, UNABLE TO IDENTIFY SOURCE. BLOOD FIRST SEEN ABOUT 35 MINUTES BEYOND FIRST DUODENAL IMAGE.   Colonoscopy Jan 2012   ENDOSCOPIC IMPRESSION:  1) Normal colon  2) Internal hemorrhoids    Impression / Plan:   71. 68 year old male in ED with chest pain and bilateral lower leg pain. Workup in progress.   2. Recent GI bleed. Patient describes passage of red blood followed by black stools several days back. He is not a great historian. Patient takes iron tablets. He currently has no blood or stool on DRE. BUN hasn't been elevated (at least not out of proportion to creatinine). Difficult to know at this point where he bled from as history isn't clear and no active bleeding now (just heme +). He does have known history of small bowel AVMs and this is a possibility. Cocaine can cause some bowel ischemia but less likely.   2. Acute on chronic anemia, baseline hgb mid 7 to 9, currently down to 6.6. Marland KitchenHis ferrin is low normal at 24, MCV low normal at 81. Patient may have element of iron deficiency. Getting 2 units of blood.   3. Chest pain, troponin negative. Just pulmonary vascular congestion on CXR. Chest pain may be from anemia but patient has significant cardiac history, cardiology to see.  4. Multiple medical problems  5. Illicit drug use (cocaine)  Thanks   LOS: 0 days   Willia Craze  08/02/2013, 9:13  AM   ________________________________________________________________________  Velora Heckler GI MD note:  I personally examined the patient, reviewed the data and agree with the assessment and plan described above.  He will probably need endoscopic evaluation but given CP, complex cardiac history he needs blood transfusion and consider cardiology evaluation first.   For now twice daily PPI, orally is fine. He is not actively bleeding.   Owens Loffler, MD [facility] Pager [phone]

## 2013-08-04 NOTE — Transfer of Care (Signed)
Immediate Anesthesia Transfer of Care Note  Patient: Charles [name]  Procedure(s) Performed: Procedure(s): ESOPHAGOGASTRODUODENOSCOPY (EGD) WITH PROPOFOL (N/A)  Patient Location: PACU  Anesthesia Type:MAC  Level of Consciousness: awake, alert , oriented and patient cooperative  Airway & Oxygen Therapy: Patient Spontanous Breathing and Patient connected to nasal cannula oxygen  Post-op Assessment: Report given to PACU RN, Post -op Vital signs reviewed and stable and Patient moving all extremities X 4  Post vital signs: Reviewed and stable  Complications: No apparent anesthesia complications

## 2013-08-04 NOTE — Evaluation (Signed)
Physical Therapy Evaluation Patient Details Name: Charles [name] MRN: [id number] DOB: [birth year] Today's Date: 08/04/2013   History of Present Illness  Pt admitted with GIB, CHF  Clinical Impression  Pt with limited participation and agitated easily with questions and clarification of home setup. Pt reports difficulty with ADLs but does not desire further dME other than recommended RW, states his friend is there all the time but needs someone to cook and clean. Pt educated for HEP, transfers and gait and encouraged to continue mobility but only agreeable to very limited activity currently. Pt would benefit from acute therapy to maximize gait, strength and function as pt willing to participate.     Follow Up Recommendations Home health PT    Equipment Recommendations  Rolling walker with 5" wheels    Recommendations for Other Services       Precautions / Restrictions Precautions Precautions: Fall      Mobility  Bed Mobility               General bed mobility comments: pt EOB on arrival, refused bed mobility and educated for need to elevate bil LE with sitting but denied chair or scooting back in bed  Transfers Overall transfer level: Modified independent                  Ambulation/Gait Ambulation/Gait assistance: Supervision pt automatically reaching for environmental supports  Ambulation Distance (Feet): 20 Feet Assistive device: Rolling walker (2 wheeled) Gait Pattern/deviations: Step-to pattern;Trunk flexed;Decreased stride length   Gait velocity interpretation: <1.8 ft/sec, indicative of risk for recurrent falls General Gait Details: pt with flexed posture and cues for posture and position in RW. Pt denied further gait or stairs due to ankle pain 8/10  Stairs            Wheelchair Mobility    Modified Rankin (Stroke Patients Only)       Balance                                             Pertinent Vitals/Pain 8/10  bil ankles in standing    Home Living Family/patient expects to be discharged to:: Private residence Living Arrangements: Non-relatives/Friends Available Help at Discharge: Friend(s);Available 24 hours/day Type of Home: House Home Access: Stairs to enter   [employer] of Steps: 3 Home Layout: One level Home Equipment: None      Prior Function Level of Independence: Independent         Comments: pt reports sitting on tub to sponge bathe, furniture walking and having difficulty with getting to his feet     Hand Dominance        Extremity/Trunk Assessment   Upper Extremity Assessment: Overall WFL for tasks assessed           Lower Extremity Assessment: Generalized weakness;RLE deficits/detail;LLE deficits/detail RLE Deficits / Details: pt with 3/5 knee flexion, extension and hip flexion but requires max encouragement to perform full ROM, denied attempts at resistance LLE Deficits / Details: 3/5 knee flexion, extension and hip flexion but requires max encouragement to perform full ROM, denied attempts at resistance  Cervical / Trunk Assessment: Kyphotic  Communication   Communication: No difficulties  Cognition Arousal/Alertness: Awake/alert Behavior During Therapy: Agitated Overall Cognitive Status: No family/caregiver present to determine baseline cognitive functioning  General Comments      Exercises General Exercises - Lower Extremity Long Arc Quad: AROM;5 reps;Both;Seated Toe Raises: AROM;5 reps;Both;Seated      Assessment/Plan    PT Assessment Patient needs continued PT services  PT Diagnosis Difficulty walking;Generalized weakness   PT Problem List Decreased strength;Decreased activity tolerance;Decreased mobility;Decreased safety awareness;Decreased knowledge of use of DME;Pain  PT Treatment Interventions Gait training;DME instruction;Functional mobility training;Therapeutic activities;Therapeutic  exercise;Stair training;Patient/family education   PT Goals (Current goals can be found in the Care Plan section) Acute Rehab PT Goals Patient Stated Goal: be able to walk around and get some help PT Goal Formulation: With patient Time For Goal Achievement: 08/11/13 Potential to Achieve Goals: Fair    Frequency Min 2X/week   Barriers to discharge Decreased caregiver support      Co-evaluation               End of Session Equipment Utilized During Treatment: Gait belt Activity Tolerance: Patient limited by pain Patient left: in bed;with call bell/phone within reach Nurse Communication: Mobility status         Time: 1217-1242 PT Time Calculation (min): 25 min   Charges:   PT Evaluation $Initial PT Evaluation Tier I: 1 Procedure PT Treatments $Therapeutic Activity: 8-22 mins   PT G Codes:          Tarance Balan B Rhaya Coale 08/04/2013, 12:50 PM Elwyn Reach, [place]

## 2013-08-05 ENCOUNTER — Encounter (HOSPITAL_COMMUNITY): Payer: Self-pay | Admitting: Gastroenterology

## 2013-08-11 ENCOUNTER — Emergency Department (HOSPITAL_COMMUNITY): Payer: Medicare Other

## 2013-08-11 ENCOUNTER — Encounter (HOSPITAL_COMMUNITY): Payer: Self-pay | Admitting: Emergency Medicine

## 2013-08-11 ENCOUNTER — Inpatient Hospital Stay (HOSPITAL_COMMUNITY)
Admission: EM | Admit: 2013-08-11 | Discharge: 2013-08-19 | DRG: 292 | Disposition: A | Discharge status: Home / Self Care | Payer: Medicare Other | Attending: Internal Medicine | Admitting: Internal Medicine

## 2013-08-11 DIAGNOSIS — M51379 Other intervertebral disc degeneration, lumbosacral region without mention of lumbar back pain or lower extremity pain: Secondary | ICD-10-CM | POA: Diagnosis present

## 2013-08-11 DIAGNOSIS — D509 Iron deficiency anemia, unspecified: Secondary | ICD-10-CM | POA: Diagnosis present

## 2013-08-11 DIAGNOSIS — R079 Chest pain, unspecified: Secondary | ICD-10-CM

## 2013-08-11 DIAGNOSIS — I272 Pulmonary hypertension, unspecified: Secondary | ICD-10-CM

## 2013-08-11 DIAGNOSIS — I2789 Other specified pulmonary heart diseases: Secondary | ICD-10-CM | POA: Diagnosis present

## 2013-08-11 DIAGNOSIS — N183 Chronic kidney disease, stage 3 unspecified: Secondary | ICD-10-CM | POA: Diagnosis present

## 2013-08-11 DIAGNOSIS — Z951 Presence of aortocoronary bypass graft: Secondary | ICD-10-CM

## 2013-08-11 DIAGNOSIS — K5520 Angiodysplasia of colon without hemorrhage: Secondary | ICD-10-CM | POA: Diagnosis present

## 2013-08-11 DIAGNOSIS — I5043 Acute on chronic combined systolic (congestive) and diastolic (congestive) heart failure: Principal | ICD-10-CM | POA: Diagnosis present

## 2013-08-11 DIAGNOSIS — I5023 Acute on chronic systolic (congestive) heart failure: Secondary | ICD-10-CM

## 2013-08-11 DIAGNOSIS — M79605 Pain in left leg: Secondary | ICD-10-CM

## 2013-08-11 DIAGNOSIS — I255 Ischemic cardiomyopathy: Secondary | ICD-10-CM

## 2013-08-11 DIAGNOSIS — R072 Precordial pain: Secondary | ICD-10-CM

## 2013-08-11 DIAGNOSIS — I509 Heart failure, unspecified: Secondary | ICD-10-CM | POA: Diagnosis present

## 2013-08-11 DIAGNOSIS — E785 Hyperlipidemia, unspecified: Secondary | ICD-10-CM | POA: Diagnosis present

## 2013-08-11 DIAGNOSIS — E119 Type 2 diabetes mellitus without complications: Secondary | ICD-10-CM

## 2013-08-11 DIAGNOSIS — I701 Atherosclerosis of renal artery: Secondary | ICD-10-CM | POA: Diagnosis present

## 2013-08-11 DIAGNOSIS — I200 Unstable angina: Secondary | ICD-10-CM

## 2013-08-11 DIAGNOSIS — R001 Bradycardia, unspecified: Secondary | ICD-10-CM

## 2013-08-11 DIAGNOSIS — Z5900 Homelessness unspecified: Secondary | ICD-10-CM

## 2013-08-11 DIAGNOSIS — K31819 Angiodysplasia of stomach and duodenum without bleeding: Secondary | ICD-10-CM

## 2013-08-11 DIAGNOSIS — Z7982 Long term (current) use of aspirin: Secondary | ICD-10-CM

## 2013-08-11 DIAGNOSIS — F17200 Nicotine dependence, unspecified, uncomplicated: Secondary | ICD-10-CM | POA: Diagnosis present

## 2013-08-11 DIAGNOSIS — R569 Unspecified convulsions: Secondary | ICD-10-CM

## 2013-08-11 DIAGNOSIS — M79604 Pain in right leg: Secondary | ICD-10-CM

## 2013-08-11 DIAGNOSIS — Z7251 High risk heterosexual behavior: Secondary | ICD-10-CM

## 2013-08-11 DIAGNOSIS — M5136 Other intervertebral disc degeneration, lumbar region: Secondary | ICD-10-CM

## 2013-08-11 DIAGNOSIS — J449 Chronic obstructive pulmonary disease, unspecified: Secondary | ICD-10-CM | POA: Diagnosis present

## 2013-08-11 DIAGNOSIS — I5022 Chronic systolic (congestive) heart failure: Secondary | ICD-10-CM

## 2013-08-11 DIAGNOSIS — Z886 Allergy status to analgesic agent status: Secondary | ICD-10-CM

## 2013-08-11 DIAGNOSIS — I214 Non-ST elevation (NSTEMI) myocardial infarction: Secondary | ICD-10-CM

## 2013-08-11 DIAGNOSIS — F1410 Cocaine abuse, uncomplicated: Secondary | ICD-10-CM | POA: Diagnosis present

## 2013-08-11 DIAGNOSIS — M5137 Other intervertebral disc degeneration, lumbosacral region: Secondary | ICD-10-CM | POA: Diagnosis present

## 2013-08-11 DIAGNOSIS — F1910 Other psychoactive substance abuse, uncomplicated: Secondary | ICD-10-CM

## 2013-08-11 DIAGNOSIS — I739 Peripheral vascular disease, unspecified: Secondary | ICD-10-CM

## 2013-08-11 DIAGNOSIS — I2589 Other forms of chronic ischemic heart disease: Secondary | ICD-10-CM | POA: Diagnosis present

## 2013-08-11 DIAGNOSIS — G4733 Obstructive sleep apnea (adult) (pediatric): Secondary | ICD-10-CM | POA: Diagnosis present

## 2013-08-11 DIAGNOSIS — D649 Anemia, unspecified: Secondary | ICD-10-CM

## 2013-08-11 DIAGNOSIS — R0789 Other chest pain: Secondary | ICD-10-CM

## 2013-08-11 DIAGNOSIS — I252 Old myocardial infarction: Secondary | ICD-10-CM

## 2013-08-11 DIAGNOSIS — I2510 Atherosclerotic heart disease of native coronary artery without angina pectoris: Secondary | ICD-10-CM | POA: Diagnosis present

## 2013-08-11 DIAGNOSIS — E875 Hyperkalemia: Secondary | ICD-10-CM | POA: Diagnosis not present

## 2013-08-11 DIAGNOSIS — I5021 Acute systolic (congestive) heart failure: Secondary | ICD-10-CM

## 2013-08-11 DIAGNOSIS — I129 Hypertensive chronic kidney disease with stage 1 through stage 4 chronic kidney disease, or unspecified chronic kidney disease: Secondary | ICD-10-CM | POA: Diagnosis present

## 2013-08-11 DIAGNOSIS — K219 Gastro-esophageal reflux disease without esophagitis: Secondary | ICD-10-CM | POA: Diagnosis present

## 2013-08-11 DIAGNOSIS — N179 Acute kidney failure, unspecified: Secondary | ICD-10-CM | POA: Diagnosis present

## 2013-08-11 DIAGNOSIS — Z8249 Family history of ischemic heart disease and other diseases of the circulatory system: Secondary | ICD-10-CM

## 2013-08-11 DIAGNOSIS — I10 Essential (primary) hypertension: Secondary | ICD-10-CM | POA: Diagnosis present

## 2013-08-11 DIAGNOSIS — J4489 Other specified chronic obstructive pulmonary disease: Secondary | ICD-10-CM | POA: Diagnosis present

## 2013-08-11 DIAGNOSIS — G40909 Epilepsy, unspecified, not intractable, without status epilepticus: Secondary | ICD-10-CM | POA: Diagnosis present

## 2013-08-11 DIAGNOSIS — K922 Gastrointestinal hemorrhage, unspecified: Secondary | ICD-10-CM

## 2013-08-11 DIAGNOSIS — I5033 Acute on chronic diastolic (congestive) heart failure: Secondary | ICD-10-CM

## 2013-08-11 DIAGNOSIS — T502X5A Adverse effect of carbonic-anhydrase inhibitors, benzothiadiazides and other diuretics, initial encounter: Secondary | ICD-10-CM | POA: Diagnosis not present

## 2013-08-11 LAB — TROPONIN I: Troponin I: <0.3 ng/mL (ref <0.30)

## 2013-08-11 LAB — CBC WITH DIFFERENTIAL/PLATELET
Basophils Absolute: 0 10*3/uL (ref 0.0–0.1)
Basophils Relative: 0 % (ref 0–1)
EOS ABS: 0.3 10*3/uL (ref 0.0–0.7)
Eosinophils Relative: 6 % — ABNORMAL HIGH (ref 0–5)
HEMATOCRIT: 26.3 % — AB (ref 39.0–52.0)
Hemoglobin: 8.5 g/dL — ABNORMAL LOW (ref 13.0–17.0)
Lymphocytes Relative: 21 % (ref 12–46)
Lymphs Abs: 1 10*3/uL (ref 0.7–4.0)
MCH: 27.3 pg (ref 26.0–34.0)
MCHC: 32.3 g/dL (ref 30.0–36.0)
MCV: 84.6 fL (ref 78.0–100.0)
Monocytes Absolute: 0.4 10*3/uL (ref 0.1–1.0)
Monocytes Relative: 8 % (ref 3–12)
Neutro Abs: 3.1 10*3/uL (ref 1.7–7.7)
Neutrophils Relative %: 65 % (ref 43–77)
PLATELETS: 250 10*3/uL (ref 150–400)
RBC: 3.11 MIL/uL — ABNORMAL LOW (ref 4.22–5.81)
RDW: 19.3 % — AB (ref 11.5–15.5)
WBC: 4.7 10*3/uL (ref 4.0–10.5)

## 2013-08-11 LAB — COMPREHENSIVE METABOLIC PANEL
ALK PHOS: 112 U/L (ref 39–117)
ALT: 27 U/L (ref 0–53)
AST: 57 U/L — ABNORMAL HIGH (ref 0–37)
Albumin: 3 g/dL — ABNORMAL LOW (ref 3.5–5.2)
BILIRUBIN TOTAL: 0.3 mg/dL (ref 0.3–1.2)
BUN: 18 mg/dL (ref 6–23)
CHLORIDE: 106 meq/L (ref 96–112)
CO2: 22 meq/L (ref 19–32)
Calcium: 8.5 mg/dL (ref 8.4–10.5)
Creatinine, Ser: 0.99 mg/dL (ref 0.50–1.35)
GFR, EST NON AFRICAN AMERICAN: 82 mL/min — AB (ref >90)
Glucose, Bld: 85 mg/dL (ref 70–99)
POTASSIUM: 4.2 meq/L (ref 3.7–5.3)
Sodium: 139 mEq/L (ref 137–147)
Total Protein: 7.2 g/dL (ref 6.0–8.3)

## 2013-08-11 LAB — I-STAT TROPONIN, ED: Troponin i, poc: 0.08 ng/mL (ref 0.00–0.08)

## 2013-08-11 LAB — RAPID URINE DRUG SCREEN, HOSP PERFORMED
Amphetamines: NOT DETECTED
Barbiturates: NOT DETECTED
Benzodiazepines: NOT DETECTED
Cocaine: NOT DETECTED
OPIATES: POSITIVE — AB
TETRAHYDROCANNABINOL: NOT DETECTED

## 2013-08-11 LAB — PRO B NATRIURETIC PEPTIDE: Pro B Natriuretic peptide (BNP): 2998 pg/mL — ABNORMAL HIGH (ref 0–125)

## 2013-08-11 LAB — MRSA PCR SCREENING: MRSA by PCR: POSITIVE — AB

## 2013-08-11 LAB — GLUCOSE, CAPILLARY: Glucose-Capillary: 123 mg/dL — ABNORMAL HIGH (ref 70–99)

## 2013-08-11 MED ORDER — AMLODIPINE BESYLATE 5 MG PO TABS
5.0000 mg | ORAL_TABLET | Freq: Every day | ORAL | Status: DC
  Filled 2013-08-11: qty 1

## 2013-08-11 MED ORDER — ENOXAPARIN SODIUM 40 MG/0.4ML ~~LOC~~ SOLN
40.0000 mg | SUBCUTANEOUS | Status: DC
  Administered 2013-08-16: 40 mg via SUBCUTANEOUS
  Filled 2013-08-11 (×10): qty 0.4

## 2013-08-11 MED ORDER — NITROGLYCERIN 0.4 MG SL SUBL
0.4000 mg | SUBLINGUAL_TABLET | SUBLINGUAL | Status: DC | PRN
  Administered 2013-08-11: 0.4 mg via SUBLINGUAL

## 2013-08-11 MED ORDER — SPIRONOLACTONE 50 MG PO TABS
50.0000 mg | ORAL_TABLET | Freq: Every day | ORAL | Status: DC
  Administered 2013-08-11 – 2013-08-16 (×6): 50 mg via ORAL
  Filled 2013-08-11 (×6): qty 1

## 2013-08-11 MED ORDER — GI COCKTAIL ~~LOC~~
30.0000 mL | Freq: Four times a day (QID) | ORAL | Status: DC | PRN
  Filled 2013-08-11: qty 30

## 2013-08-11 MED ORDER — TIZANIDINE HCL 2 MG PO TABS
2.0000 mg | ORAL_TABLET | Freq: Every day | ORAL | Status: DC
  Administered 2013-08-11 – 2013-08-19 (×9): 2 mg via ORAL
  Filled 2013-08-11 (×9): qty 1

## 2013-08-11 MED ORDER — CLONIDINE HCL 0.2 MG PO TABS
0.2000 mg | ORAL_TABLET | Freq: Two times a day (BID) | ORAL | Status: DC
  Administered 2013-08-11 – 2013-08-13 (×5): 0.2 mg via ORAL
  Filled 2013-08-11 (×6): qty 1

## 2013-08-11 MED ORDER — FUROSEMIDE 10 MG/ML IJ SOLN
40.0000 mg | Freq: Two times a day (BID) | INTRAMUSCULAR | Status: DC
  Administered 2013-08-11 – 2013-08-12 (×3): 40 mg via INTRAVENOUS
  Filled 2013-08-11 (×5): qty 4

## 2013-08-11 MED ORDER — PHENYTOIN SODIUM EXTENDED 100 MG PO CAPS
200.0000 mg | ORAL_CAPSULE | Freq: Every morning | ORAL | Status: DC
  Administered 2013-08-12 – 2013-08-19 (×8): 200 mg via ORAL
  Filled 2013-08-11 (×8): qty 2

## 2013-08-11 MED ORDER — MUPIROCIN 2 % EX OINT
TOPICAL_OINTMENT | CUTANEOUS | Status: AC
  Administered 2013-08-11: 22:00:00
  Filled 2013-08-11: qty 22

## 2013-08-11 MED ORDER — CARVEDILOL 3.125 MG PO TABS
3.1250 mg | ORAL_TABLET | Freq: Two times a day (BID) | ORAL | Status: DC
  Administered 2013-08-11 – 2013-08-17 (×11): 3.125 mg via ORAL
  Filled 2013-08-11 (×15): qty 1

## 2013-08-11 MED ORDER — SIMVASTATIN 40 MG PO TABS
40.0000 mg | ORAL_TABLET | Freq: Every evening | ORAL | Status: DC
  Filled 2013-08-11: qty 1

## 2013-08-11 MED ORDER — AMLODIPINE BESYLATE 10 MG PO TABS
10.0000 mg | ORAL_TABLET | Freq: Every day | ORAL | Status: DC
  Administered 2013-08-11 – 2013-08-19 (×9): 10 mg via ORAL
  Filled 2013-08-11 (×9): qty 1

## 2013-08-11 MED ORDER — PHENYTOIN SODIUM EXTENDED 100 MG PO CAPS
300.0000 mg | ORAL_CAPSULE | Freq: Every day | ORAL | Status: DC
  Administered 2013-08-11 – 2013-08-18 (×8): 300 mg via ORAL
  Filled 2013-08-11 (×9): qty 3

## 2013-08-11 MED ORDER — FUROSEMIDE 40 MG PO TABS: 40.0000 mg | ORAL_TABLET | Freq: Every day | ORAL | Status: DC

## 2013-08-11 MED ORDER — PANTOPRAZOLE SODIUM 40 MG PO TBEC
40.0000 mg | DELAYED_RELEASE_TABLET | Freq: Two times a day (BID) | ORAL | Status: DC
  Administered 2013-08-11 – 2013-08-19 (×16): 40 mg via ORAL
  Filled 2013-08-11 (×14): qty 1

## 2013-08-11 MED ORDER — CHLORHEXIDINE GLUCONATE CLOTH 2 % EX PADS
6.0000 | MEDICATED_PAD | Freq: Every day | CUTANEOUS | Status: AC
  Administered 2013-08-12 – 2013-08-16 (×5): 6 via TOPICAL

## 2013-08-11 MED ORDER — GABAPENTIN 100 MG PO CAPS
100.0000 mg | ORAL_CAPSULE | Freq: Two times a day (BID) | ORAL | Status: DC
  Administered 2013-08-11 – 2013-08-19 (×17): 100 mg via ORAL
  Filled 2013-08-11 (×20): qty 1

## 2013-08-11 MED ORDER — CLONIDINE HCL 0.2 MG PO TABS
0.2000 mg | ORAL_TABLET | Freq: Every day | ORAL | Status: DC
  Filled 2013-08-11: qty 1

## 2013-08-11 MED ORDER — NITROGLYCERIN IN D5W 200-5 MCG/ML-% IV SOLN
2.0000 ug/min | INTRAVENOUS | Status: DC
  Administered 2013-08-11: 5 ug/min via INTRAVENOUS
  Administered 2013-08-12: 30 ug/min via INTRAVENOUS
  Filled 2013-08-11: qty 250

## 2013-08-11 MED ORDER — MORPHINE SULFATE 2 MG/ML IJ SOLN
2.0000 mg | INTRAMUSCULAR | Status: DC | PRN
  Administered 2013-08-11 – 2013-08-19 (×33): 2 mg via INTRAVENOUS
  Filled 2013-08-11 (×33): qty 1

## 2013-08-11 MED ORDER — DIVALPROEX SODIUM 500 MG PO DR TAB
500.0000 mg | DELAYED_RELEASE_TABLET | Freq: Two times a day (BID) | ORAL | Status: DC
  Administered 2013-08-11 – 2013-08-19 (×17): 500 mg via ORAL
  Filled 2013-08-11 (×18): qty 1

## 2013-08-11 MED ORDER — ATORVASTATIN CALCIUM 20 MG PO TABS
20.0000 mg | ORAL_TABLET | Freq: Every day | ORAL | Status: DC
  Administered 2013-08-11 – 2013-08-18 (×8): 20 mg via ORAL
  Filled 2013-08-11 (×9): qty 1

## 2013-08-11 MED ORDER — FUROSEMIDE 10 MG/ML IJ SOLN
40.0000 mg | Freq: Once | INTRAMUSCULAR | Status: AC
  Administered 2013-08-11: 40 mg via INTRAVENOUS
  Filled 2013-08-11: qty 4

## 2013-08-11 MED ORDER — HYDRALAZINE HCL 20 MG/ML IJ SOLN
10.0000 mg | INTRAMUSCULAR | Status: DC | PRN
  Administered 2013-08-14: 10 mg via INTRAVENOUS
  Filled 2013-08-11: qty 1

## 2013-08-11 MED ORDER — MORPHINE SULFATE 4 MG/ML IJ SOLN
4.0000 mg | Freq: Once | INTRAMUSCULAR | Status: AC
  Administered 2013-08-11: 4 mg via INTRAVENOUS
  Filled 2013-08-11: qty 1

## 2013-08-11 MED ORDER — PHENYTOIN SODIUM EXTENDED 100 MG PO CAPS
200.0000 mg | ORAL_CAPSULE | Freq: Two times a day (BID) | ORAL | Status: DC
  Administered 2013-08-11: 200 mg via ORAL
  Filled 2013-08-11 (×2): qty 3

## 2013-08-11 MED ORDER — HYDRALAZINE HCL 50 MG PO TABS
100.0000 mg | ORAL_TABLET | Freq: Three times a day (TID) | ORAL | Status: DC
  Administered 2013-08-11 – 2013-08-14 (×12): 100 mg via ORAL
  Filled 2013-08-11 (×15): qty 2

## 2013-08-11 MED ORDER — FERROUS SULFATE 325 (65 FE) MG PO TABS
325.0000 mg | ORAL_TABLET | Freq: Two times a day (BID) | ORAL | Status: DC
  Administered 2013-08-11 – 2013-08-19 (×17): 325 mg via ORAL
  Filled 2013-08-11 (×19): qty 1

## 2013-08-11 MED ORDER — LISINOPRIL 20 MG PO TABS
20.0000 mg | ORAL_TABLET | Freq: Two times a day (BID) | ORAL | Status: DC
  Administered 2013-08-11 – 2013-08-15 (×10): 20 mg via ORAL
  Filled 2013-08-11 (×12): qty 1

## 2013-08-11 MED ORDER — MUPIROCIN 2 % EX OINT
1.0000 | TOPICAL_OINTMENT | Freq: Two times a day (BID) | CUTANEOUS | Status: AC
Start: 2013-08-11 — End: 2013-08-16
  Administered 2013-08-11 – 2013-08-15 (×9): 1 via NASAL
  Filled 2013-08-11: qty 22

## 2013-08-11 MED ORDER — PHENYTOIN SODIUM EXTENDED 100 MG PO CAPS
200.0000 mg | ORAL_CAPSULE | Freq: Every morning | ORAL | Status: DC
  Filled 2013-08-11: qty 2

## 2013-08-11 MED ORDER — ASPIRIN EC 81 MG PO TBEC
81.0000 mg | DELAYED_RELEASE_TABLET | Freq: Every day | ORAL | Status: DC
  Administered 2013-08-11 – 2013-08-19 (×9): 81 mg via ORAL
  Filled 2013-08-11 (×9): qty 1

## 2013-08-11 NOTE — H&P (Signed)
[facility] History and Physical  Patient: Charles [name]  [id number]  DOB: 07-16-1945  DOS: the patient was seen and examined on 08/11/2013 PCP: [name]  Chief Complaint: Shortness of breath and orthopnea  HPI: [name] is a 68 y.o. male with Past medical history of hypertension, coronary artery disease, dyslipidemia, COPD, active smoker, substance abuse, sleep apnea. The patient is presenting with complaints of shortness of breath. He mentions that she was at his place and at around 9 PM and he went to sleep when he woke from sleep with complaints of shortness of breath cough and chest pain. He had substernal chest pain which was radiating to his left arm and radiating to his jaw. The pain was not getting better and therefore he decided to come to the hospital but he continues to have shortness of breath. He mentions he has at his baseline shortness of breath on exertion. No shortness of breath on lying down. He mentions he is compliant with his medication denies any substance abuse recently. He complains of nausea, vomiting, dizziness, cough without expectoration, leg pain. He mentions that he has significant bone pain that has been present since last one month.  The patient is coming from home. And at his baseline independent for most of his ADL.  Review of Systems: as mentioned in the history of present illness.  A Comprehensive review of the other systems is negative.  Past Medical History  Diagnosis Date  . Iron deficiency anemia   . Hypertension   . Chronic systolic heart failure     2D Echo (03/2010) - EF 35-40% with akinesis of inferoposterior myocardium  . CAD (coronary artery disease)     s/p 3-vessel CABG (12/2007) // 100% RCA stenosis with collaterals from left system. Severe bifurcation lesions of proxima CXA and OM. Moderate LAD diseaseFollowed by Dr[facility]yline [facility]  . Hyperlipidemia LDL goal <70   . Obstructive sleep apnea     not on  home CPAP  . Ischemic cardiomyopathy   . Chronic kidney disease (CKD), stage III (moderate)     BL SCr 1.5-1.6  . Homelessness   . 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 // Colonoscopy and EGD in 03/2010 negative per report (record not found)  . 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 Polysubstance abuse     cocaine, THC  . AV malformation of gastrointestinal tract   . Family history of early CAD   . DDD (degenerative disc disease), lumbar   . Renal artery stenosis   . Peripheral vascular disease   . Seizure disorder   . Diabetes   . Acute on chronic systolic CHF (congestive heart failure) 08/13/2012  . Myocardial infarction   . Seizures    Past Surgical History  Procedure Laterality Date  . Coronary artery bypass graft  12/2007  . Apc  03/2010    To treat small bowel AVMs  . Esophagogastroduodenoscopy N/A 08/14/2012    Procedure: ESOPHAGOGASTRODUODENOSCOPY (EGD);  Surgeon: [name], MD;  Location: [facility] ENDOSCOPY;  Service: Endoscopy;  Laterality: N/A;  . Hot hemostasis N/A 08/14/2012    Procedure: HOT HEMOSTASIS (ARGON PLASMA COAGULATION/BICAP);  Surgeon: [name], MD;  Location: [facility] ENDOSCOPY;  Service: Endoscopy;  Laterality: N/A;  . Esophagogastroduodenoscopy (egd) with propofol N/A 08/04/2013  Procedure: ESOPHAGOGASTRODUODENOSCOPY (EGD) WITH PROPOFOL;  Surgeon: [name], MD;  Location: [facility] ENDOSCOPY;  Service: Endoscopy;  Laterality: N/A;   Social History:  reports that he has been smoking Cigarettes.  He has a 2.6 pack-year smoking history. He has never used smokeless tobacco. He reports that he drinks alcohol. He reports that he uses illicit drugs (Cocaine and Marijuana) about once per week.  Allergies  Allergen Reactions  . Motrin [Ibuprofen] Other (See Comments)    Affects kidneys  . Tylenol  [Acetaminophen] Other (See Comments)    Affects kidneys    Family History  Problem Relation Age of Onset  . Heart disease Mother     unknown type  . Heart disease Father 66    died of MI at 48yo  . Heart disease Paternal Grandfather 81    died of MI  . Heart disease    . Heart disease Brother 30    Prior to Admission medications   Medication Sig Start Date End Date Taking? Authorizing Provider  amLODipine (NORVASC) 5 MG tablet Take 1 tablet (5 mg total) by mouth daily. 08/04/13  Yes [name], MD  aspirin EC 81 MG tablet Take 1 tablet (81 mg total) by mouth daily. 08/18/13  Yes [name], MD  carvedilol (COREG) 3.125 MG tablet Take 1 tablet (3.125 mg total) by mouth 2 (two) times daily with a meal. 08/04/13  Yes [name], MD  cloNIDine (CATAPRES) 0.2 MG tablet Take 0.2 mg by mouth daily.   Yes Historical Provider, MD  divalproex (DEPAKOTE) 500 MG DR tablet Take 1 tablet (500 mg total) by mouth 2 (two) times daily. 03/13/13  Yes [name], MD  ferrous sulfate 325 (65 FE) MG tablet Take 1 tablet (325 mg total) by mouth 2 (two) times daily with a meal. 03/13/13  Yes [name], MD  furosemide (LASIX) 40 MG tablet Take 1 tablet (40 mg total) by mouth daily. 08/04/13  Yes [name], MD  gabapentin (NEURONTIN) 100 MG capsule Take 1 capsule (100 mg total) by mouth 2 (two) times daily. 07/21/13  Yes Charlynne Cousins, MD  hydrALAZINE (APRESOLINE) 100 MG tablet Take 1 tablet (100 mg total) by mouth 3 (three) times daily. 08/04/13  Yes [name], MD  lisinopril (PRINIVIL,ZESTRIL) 20 MG tablet Take 1 tablet (20 mg total) by mouth 2 (two) times daily. 08/04/13  Yes [name], MD  pantoprazole (PROTONIX) 40 MG tablet Take 1 tablet (40 mg total) by mouth 2 (two) times daily. 08/04/13  Yes [name], MD  phenytoin (DILANTIN) 100 MG ER capsule Take 2-3 capsules (200-300 mg total) by mouth 2 (two) times daily. Take 2 capsules by mouth in the morning  and 3 capsules by mouth at bedtime. 08/04/13  Yes [name], MD  potassium chloride (K-DUR) 10 MEQ tablet Take 1 tablet (10 mEq total) by mouth daily. 03/13/13  Yes [name], MD  simvastatin (ZOCOR) 40 MG tablet Take 1 tablet (40 mg total) by mouth every evening. 03/13/13  Yes [name], MD  spironolactone (ALDACTONE) 50 MG tablet Take 1 tablet (50 mg total) by mouth daily. 08/04/13  Yes [name], MD  tiZANidine (ZANAFLEX) 2 MG tablet Take 2 mg by mouth daily.  07/24/13  Yes Historical Provider, MD    Physical Exam: Filed Vitals:   08/11/13 0030 08/11/13 0312  BP: 163/84 167/109  Pulse: 81 90  Temp: 98.1 F (36.7 C)  TempSrc: Oral   Resp: 24 14  SpO2: 100% 99%    General: Alert, Awake and Oriented to Time, Place and Person. Appear in mild distress Eyes: PERRL ENT: Oral Mucosa clear moist. Neck: mild JVD Cardiovascular: S1 and S2 Present, aortic systolic Murmur, Peripheral Pulses Present Respiratory: Bilateral Air entry equal and Decreased, Clear to Auscultation,  No Crackles, no wheezes Abdomen: Bowel Sound Present, Soft and Non tender Skin: No Rash Extremities: Trace Pedal edema, no calf tenderness Neurologic: Grossly no focal neuro deficit. Labs on Admission:  CBC:  Recent Labs Lab 08/11/13 0112  WBC 4.7  NEUTROABS 3.1  HGB 8.5*  HCT 26.3*  MCV 84.6  PLT 250    CMP     Component Value Date/Time   NA 139 08/11/2013 0112   K 4.2 08/11/2013 0112   CL 106 08/11/2013 0112   CO2 22 08/11/2013 0112   GLUCOSE 85 08/11/2013 0112   BUN 18 08/11/2013 0112   CREATININE 0.99 08/11/2013 0112   CREATININE 1.32 05/11/2010 2300   CALCIUM 8.5 08/11/2013 0112   PROT 7.2 08/11/2013 0112   ALBUMIN 3.0* 08/11/2013 0112   AST 57* 08/11/2013 0112   ALT 27 08/11/2013 0112   ALKPHOS 112 08/11/2013 0112   BILITOT 0.3 08/11/2013 0112   GFRNONAA 82* 08/11/2013 0112   GFRAA >90 08/11/2013 0112    No results found for this basename: LIPASE, AMYLASE,  in the last 168  hours No results found for this basename: AMMONIA,  in the last 168 hours  No results found for this basename: CKTOTAL, CKMB, CKMBINDEX, TROPONINI,  in the last 168 hours BNP (last 3 results)  Recent Labs  07/19/13 0430 08/02/13 0808 08/11/13 0112  PROBNP 981.3* 1930.0* 2998.0*    Radiological Exams on Admission: Dg Chest 2 View  08/11/2013   CLINICAL DATA:  Shortness of breath.  EXAM: CHEST  2 VIEW  COMPARISON:  Chest radiograph performed 08/02/2013  FINDINGS: The lungs are well-aerated. Vascular congestion is noted. Increased interstitial markings may reflect mild interstitial edema or possibly pneumonia. No pleural effusion or pneumothorax is seen.  The heart is enlarged. The patient is status post median sternotomy, with evidence of prior CABG. No acute osseous abnormalities are seen.  IMPRESSION: Vascular congestion and cardiomegaly. Increased interstitial markings may reflect mild interstitial edema or possibly pneumonia.   Electronically Signed   By: Garald Balding M.D.   On: 08/11/2013 01:57    EKG: Independently reviewed. normal EKG, normal sinus rhythm, nonspecific ST and T waves changes.  Assessment/Plan Principal Problem:   Chest pain Active Problems:   Hypertension   Chronic systolic heart failure   CAD (coronary artery disease)   Ischemic cardiomyopathy   Chronic kidney disease (CKD), stage III (moderate)   Moderate to severe pulmonary hypertension   Seizure disorder   1. Chest pain Hypertensive urgency  The patient is presenting with complaints of substernal chest pain radiating to his left arm. At present he continues to have chest pain despite nitroglycerin and morphine and related to step down but I would start him on nitroglycerin drip morphine when necessary for pain consider troponins and echocardiogram in the morning. Continue aspirin, clonidine, carvedilol, hydralazine, lisinopril  Of note patient is on multiple medications at home and I question his  compliance  2. Possible acute on chronic systolic dysfunction. Continue Lasix at home dose. Continue carvedilol, aspirin, hydralazine.  3. History of seizure disorder  continue Depakote, and Dilantin.  4. GERD Continue Protonix  DVT Prophylaxis: subcutaneous  Heparin Nutrition: N.p.o.  Code Status: Full  Disposition: Admitted to inpatient in telemetry unit.  Author: Berle Mull, MD [facility] Pager: [phone] 08/11/2013, 5:36 AM    If 7PM-7AM, please contact night-coverage [email or web address] Password [email or web address]

## 2013-08-11 NOTE — Progress Notes (Signed)
Utilization Review Completed.Zyair Rhein T Dowell5/25/2015  

## 2013-08-11 NOTE — ED Notes (Signed)
Back from xray

## 2013-08-11 NOTE — ED Provider Notes (Signed)
CSN: [id number]     Arrival date & time 08/11/13  0018 History   First MD Initiated Contact with Patient 08/11/13 0103     Chief Complaint  Patient presents with  . Shortness of Breath     (Consider location/radiation/quality/duration/timing/severity/associated sxs/prior Treatment) HPI Comments: Patient is a 68 year old male with past medical history significant for coronary artery disease with bypass, CHF, hypertension. He presents with complaints of difficulty breathing and tightness in his chest that began suddenly when attempting to lie down to go to bed. This started about 9:00 this evening. He states the discomfort radiates to his left arm and he feels nauseated. He denies any fevers, chills, or productive cough.  His cardiologist is Dr. Wyline Copas in [facility].  Patient is a 68 y.o. male presenting with shortness of breath. The history is provided by the patient.  Shortness of Breath Severity:  Moderate Onset quality:  Sudden Duration:  4 hours Timing:  Constant Progression:  Unchanged Chronicity:  New Context: activity   Context: not URI   Relieved by:  Nothing Worsened by:  Nothing tried Ineffective treatments:  None tried   Past Medical History  Diagnosis Date  . Iron deficiency anemia   . Hypertension   . Chronic systolic heart failure     2D Echo (03/2010) - EF 35-40% with akinesis of inferoposterior myocardium  . CAD (coronary artery disease)     s/p 3-vessel CABG (12/2007) // 100% RCA stenosis with collaterals from left system. Severe bifurcation lesions of proxima CXA and OM. Moderate LAD diseaseFollowed by Dr. [facility] in [facility]  . Hyperlipidemia LDL goal <70   . Obstructive sleep apnea     not on home CPAP  . Ischemic cardiomyopathy   . Chronic kidney disease (CKD), stage III (moderate)     BL SCr 1.5-1.6  . Homelessness   . 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 //  Colonoscopy and EGD in 03/2010 negative per report (record not found)  . 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 Polysubstance abuse     cocaine, THC  . AV malformation of gastrointestinal tract   . Family history of early CAD   . DDD (degenerative disc disease), lumbar   . Renal artery stenosis   . Peripheral vascular disease   . Seizure disorder   . Diabetes   . Acute on chronic systolic CHF (congestive heart failure) 08/13/2012  . Myocardial infarction   . Seizures    Past Surgical History  Procedure Laterality Date  . Coronary artery bypass graft  12/2007  . Apc  03/2010    To treat small bowel AVMs  . Esophagogastroduodenoscopy N/A 08/14/2012    Procedure: ESOPHAGOGASTRODUODENOSCOPY (EGD);  Surgeon: [name], MD;  Location: [facility] ENDOSCOPY;  Service: Endoscopy;  Laterality: N/A;  . Hot hemostasis N/A 08/14/2012    Procedure: HOT HEMOSTASIS (ARGON PLASMA COAGULATION/BICAP);  Surgeon: [name], MD;  Location: [facility] ENDOSCOPY;  Service: Endoscopy;  Laterality: N/A;  . Esophagogastroduodenoscopy (egd) with propofol N/A 08/04/2013    Procedure: ESOPHAGOGASTRODUODENOSCOPY (EGD) WITH PROPOFOL;  Surgeon: [name], MD;  Location: [facility] ENDOSCOPY;  Service: Endoscopy;  Laterality: N/A;   Family History  Problem Relation Age of Onset  . Heart disease Mother     unknown type  . Heart disease Father 12  died of MI at 40yo  . Heart disease Paternal Grandfather 16    died of MI  . Heart disease    . Heart disease Brother 30   History  Substance Use Topics  . Smoking status: Current Some Day Smoker -- 0.20 packs/day for 13 years    Types: Cigarettes    Last Attempt to Quit: 03/20/1997  . Smokeless tobacco: Never Used  . Alcohol Use: Yes     Comment: occasionally drinks, a few times a month    Review of Systems  Respiratory: Positive for shortness of breath.   All other systems reviewed and are  negative.     Allergies  Motrin and Tylenol  Home Medications   Prior to Admission medications   Medication Sig Start Date End Date Taking? Authorizing Provider  amLODipine (NORVASC) 5 MG tablet Take 1 tablet (5 mg total) by mouth daily. 08/04/13  Yes [name], MD  aspirin EC 81 MG tablet Take 1 tablet (81 mg total) by mouth daily. 08/18/13  Yes [name], MD  carvedilol (COREG) 3.125 MG tablet Take 1 tablet (3.125 mg total) by mouth 2 (two) times daily with a meal. 08/04/13  Yes [name], MD  cloNIDine (CATAPRES) 0.2 MG tablet Take 0.2 mg by mouth daily.   Yes Historical Provider, MD  divalproex (DEPAKOTE) 500 MG DR tablet Take 1 tablet (500 mg total) by mouth 2 (two) times daily. 03/13/13  Yes [name], MD  ferrous sulfate 325 (65 FE) MG tablet Take 1 tablet (325 mg total) by mouth 2 (two) times daily with a meal. 03/13/13  Yes [name], MD  furosemide (LASIX) 40 MG tablet Take 1 tablet (40 mg total) by mouth daily. 08/04/13  Yes [name], MD  gabapentin (NEURONTIN) 100 MG capsule Take 1 capsule (100 mg total) by mouth 2 (two) times daily. 07/21/13  Yes Charlynne Cousins, MD  hydrALAZINE (APRESOLINE) 100 MG tablet Take 1 tablet (100 mg total) by mouth 3 (three) times daily. 08/04/13  Yes [name], MD  lisinopril (PRINIVIL,ZESTRIL) 20 MG tablet Take 1 tablet (20 mg total) by mouth 2 (two) times daily. 08/04/13  Yes [name], MD  pantoprazole (PROTONIX) 40 MG tablet Take 1 tablet (40 mg total) by mouth 2 (two) times daily. 08/04/13  Yes [name], MD  phenytoin (DILANTIN) 100 MG ER capsule Take 2-3 capsules (200-300 mg total) by mouth 2 (two) times daily. Take 2 capsules by mouth in the morning and 3 capsules by mouth at bedtime. 08/04/13  Yes [name], MD  potassium chloride (K-DUR) 10 MEQ tablet Take 1 tablet (10 mEq total) by mouth daily. 03/13/13  Yes [name], MD  simvastatin (ZOCOR) 40 MG tablet Take 1 tablet  (40 mg total) by mouth every evening. 03/13/13  Yes [name], MD  spironolactone (ALDACTONE) 50 MG tablet Take 1 tablet (50 mg total) by mouth daily. 08/04/13  Yes [name], MD  tiZANidine (ZANAFLEX) 2 MG tablet Take 2 mg by mouth daily.  07/24/13  Yes Historical Provider, MD   BP 163/84  Pulse 81  Temp(Src) 98.1 F (36.7 C) (Oral)  Resp 24  SpO2 100% Physical Exam  Nursing note and vitals reviewed. Constitutional: He is oriented to person, place, and time. He appears well-developed and well-nourished. No distress.  HENT:  Head: Normocephalic and atraumatic.  Mouth/Throat: Oropharynx is clear and moist.  Neck: Normal range of motion.  Neck supple.  Cardiovascular: Normal rate, regular rhythm and normal heart sounds.   No murmur heard. Pulmonary/Chest: Effort normal and breath sounds normal.  Abdominal: Soft. Bowel sounds are normal. He exhibits no distension. There is no tenderness.  Musculoskeletal: Normal range of motion. He exhibits no edema.  Lymphadenopathy:    He has no cervical adenopathy.  Neurological: He is alert and oriented to person, place, and time.  Skin: Skin is warm and dry. He is not diaphoretic.    ED Course  Procedures (including critical care time) Labs Review Labs Reviewed  CBC WITH DIFFERENTIAL  COMPREHENSIVE METABOLIC PANEL  PRO B NATRIURETIC PEPTIDE  I-STAT [place], ED    Imaging Review No results found.   EKG Interpretation   Date/Time:  Monday Aug 11 2013 00:36:00 EDT Ventricular Rate:  88 PR Interval:  180 QRS Duration: 112 QT Interval:  384 QTC Calculation: 465 R Axis:   70 Text Interpretation:  Sinus rhythm RSR' in V1 or V2, right VCD or RVH  Probable inferior infarct, age indeterminate Lateral leads are also  involved No significant change since 08/02/13 Confirmed by DELOS  MD,  Brexley Cutshaw ([id number]) on 08/11/2013 1:15:24 AM      MDM   Final diagnoses:  None    Patient is a 68 year old male with history of CABG and  congestive heart failure. He presents with complaints of shortness of breath and coughing up white sputum. He reports severe pain in his anterior chest. Workup reveals and negative troponin, but elevated BNP and chest x-ray revealing interstitial edema. He was given Lasix. Due to his complicated medical history and nature of the symptoms, I feel as though admission for observation and rule out of MI is indicated. I've spoken with internal medicine who agrees to admit.    Veryl Speak, MD 08/11/13 [phone]

## 2013-08-11 NOTE — Consult Note (Signed)
CONSULTATION NOTE  Reason for Consult: Chest pain, hypertensive urgency  Requesting Physician: Dr. Coralyn Pear  Cardiologist: Dr. Haroldine Laws  HPI: This is a 68 y.o. male with a past medical history significant for hx/o cocaine abuse, GI bleed, systolic CHF, EF 41-66% with grade 2 diastolic dysfunction. He has known CAD s/p CABG 2009, and stage 3 CKD. Seen recently in April for progressive LE edema for 1-2 weeks. Pertinent labs on admission Pro BNP 4300, Hgb 7.4, Cr 1.06, d-dimer 1.05, positive cocaine and troponin < 0.30. He had CT angio showing atherosclerotic disease. Bilateral venous dopplers negative for DVT and VQ scan negative for PE. He was started on lasix gtt 8 mg/hr with minimal UOP and rise in creatinine so it was discontinued. He was diuresed by Dr. Haroldine Laws and by discharge he was down 24 pounds to 161 lbs, with creatinine at 1.7-1.8.  ProBNP had come down to 981 from 4293.   He is now admitted with chest pain and shortness of breath.  The chest pain is substernal and radiates to his left arm and jaw.  BP on admission was markedly elevated.  Cardiology is asked to consult regarding chest pain, dyspnea and hypertensive emergency.  Initial troponin is negative. proBNP is elevated at 2998. Creatinine is 0.99. Weight is up to 172 lbs.  PMHx:  Past Medical History  Diagnosis Date  . Iron deficiency anemia   . Hypertension   . Chronic systolic heart failure     2D Echo (03/2010) - EF 35-40% with akinesis of inferoposterior myocardium  . CAD (coronary artery disease)     s/p 3-vessel CABG (12/2007) // 100% RCA stenosis with collaterals from left system. Severe bifurcation lesions of proxima CXA and OM. Moderate LAD diseaseFollowed by Dr. Wyline Copas in [facility]  . Hyperlipidemia LDL goal <70   . Obstructive sleep apnea     not on home CPAP  . Ischemic cardiomyopathy   . Chronic kidney disease (CKD), stage III (moderate)     BL SCr 1.5-1.6  . Homelessness   . 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 // Colonoscopy and EGD in 03/2010 negative per report (record not found)  . 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 Polysubstance abuse     cocaine, THC  . AV malformation of gastrointestinal tract   . Family history of early CAD   . DDD (degenerative disc disease), lumbar   . Renal artery stenosis   . Peripheral vascular disease   . Seizure disorder   . Diabetes   . Acute on chronic systolic CHF (congestive heart failure) 08/13/2012  . Myocardial infarction   . Seizures    Past Surgical History  Procedure Laterality Date  . Coronary artery bypass graft  12/2007  . Apc  03/2010    To treat small bowel AVMs  . Esophagogastroduodenoscopy N/A 08/14/2012    Procedure: ESOPHAGOGASTRODUODENOSCOPY (EGD);  Surgeon: [name], MD;  Location[facility] ENDOSCOPY;  Service: Endoscopy;  Laterality: N/A;  . Hot hemostasis N/A 08/14/2012    Procedure: HOT HEMOSTASIS (ARGON PLASMA COAGULATION/BICAP);  Surgeon: [name], MD;  Location: [facility] ENDOSCOPY;  Service: Endoscopy;  Laterality: N/A;  . Esophagogastroduodenoscopy (egd) with propofol N/A 08/04/2013    Procedure: ESOPHAGOGASTRODUODENOSCOPY (EGD) WITH PROPOFOL;  Surgeon: [name], MD;  Location: [facility] ENDOSCOPY;  Service: Endoscopy;  Laterality: N/A;    FAMHx: Family History  Problem Relation Age of Onset  . Heart disease Mother     unknown type  . Heart disease Father 32    died of MI at 86yo  . Heart disease Paternal Grandfather 51    died of MI  . Heart disease    . Heart disease Brother 18    SOCHx:  reports that he has been smoking Cigarettes.  He has a 2.6 pack-year smoking history. He has never used smokeless tobacco. He reports that he drinks alcohol. He reports that he uses illicit drugs (Cocaine and Marijuana) about once per  week.  ALLERGIES: Allergies  Allergen Reactions  . Motrin [Ibuprofen] Other (See Comments)    Affects kidneys  . Tylenol [Acetaminophen] Other (See Comments)    Affects kidneys    ROS: A comprehensive review of systems was negative except for: Constitutional: positive for fatigue Respiratory: positive for cough and dyspnea on exertion Cardiovascular: positive for chest pressure/discomfort and lower extremity edema  HOME MEDICATIONS: Prescriptions prior to admission  Medication Sig Dispense Refill  . amLODipine (NORVASC) 5 MG tablet Take 1 tablet (5 mg total) by mouth daily.  30 tablet  2  . [START ON 08/18/2013] aspirin EC 81 MG tablet Take 1 tablet (81 mg total) by mouth daily.      . carvedilol (COREG) 3.125 MG tablet Take 1 tablet (3.125 mg total) by mouth 2 (two) times daily with a meal.  60 tablet  0  . cloNIDine (CATAPRES) 0.2 MG tablet Take 0.2 mg by mouth daily.      . divalproex (DEPAKOTE) 500 MG DR tablet Take 1 tablet (500 mg total) by mouth 2 (two) times daily.  60 tablet  1  . ferrous sulfate 325 (65 FE) MG tablet Take 1 tablet (325 mg total) by mouth 2 (two) times daily with a meal.  30 tablet  3  . furosemide (LASIX) 40 MG tablet Take 1 tablet (40 mg total) by mouth daily.  30 tablet  1  . gabapentin (NEURONTIN) 100 MG capsule Take 1 capsule (100 mg total) by mouth 2 (two) times daily.  60 capsule  2  . hydrALAZINE (APRESOLINE) 100 MG tablet Take 1 tablet (100 mg total) by mouth 3 (three) times daily.  90 tablet  1  . lisinopril (PRINIVIL,ZESTRIL) 20 MG tablet Take 1 tablet (20 mg total) by mouth 2 (two) times daily.  30 tablet  0  . pantoprazole (PROTONIX) 40 MG tablet Take 1 tablet (40 mg total) by mouth 2 (two) times daily.  60 tablet  0  . phenytoin (DILANTIN) 100 MG ER capsule Take 2-3 capsules (200-300 mg total) by mouth 2 (two) times daily. Take 2 capsules by mouth in the morning and 3 capsules by mouth at bedtime.  90 capsule  2  . potassium chloride (K-DUR) 10 MEQ  tablet Take 1 tablet (10 mEq total) by mouth daily.  30 tablet  1  . simvastatin (ZOCOR) 40 MG tablet Take 1 tablet (40 mg total) by mouth every evening.  30 tablet  1  . spironolactone (ALDACTONE) 50 MG tablet Take 1 tablet (50 mg total) by mouth daily.  30 tablet  2  . tiZANidine (ZANAFLEX) 2 MG tablet Take 2 mg by mouth daily.         HOSPITAL MEDICATIONS: Prior to Admission:  Prescriptions prior to admission  Medication Sig Dispense Refill  . amLODipine (NORVASC) 5 MG tablet Take 1 tablet (5 mg  total) by mouth daily.  30 tablet  2  . [START ON 08/18/2013] aspirin EC 81 MG tablet Take 1 tablet (81 mg total) by mouth daily.      . carvedilol (COREG) 3.125 MG tablet Take 1 tablet (3.125 mg total) by mouth 2 (two) times daily with a meal.  60 tablet  0  . cloNIDine (CATAPRES) 0.2 MG tablet Take 0.2 mg by mouth daily.      . divalproex (DEPAKOTE) 500 MG DR tablet Take 1 tablet (500 mg total) by mouth 2 (two) times daily.  60 tablet  1  . ferrous sulfate 325 (65 FE) MG tablet Take 1 tablet (325 mg total) by mouth 2 (two) times daily with a meal.  30 tablet  3  . furosemide (LASIX) 40 MG tablet Take 1 tablet (40 mg total) by mouth daily.  30 tablet  1  . gabapentin (NEURONTIN) 100 MG capsule Take 1 capsule (100 mg total) by mouth 2 (two) times daily.  60 capsule  2  . hydrALAZINE (APRESOLINE) 100 MG tablet Take 1 tablet (100 mg total) by mouth 3 (three) times daily.  90 tablet  1  . lisinopril (PRINIVIL,ZESTRIL) 20 MG tablet Take 1 tablet (20 mg total) by mouth 2 (two) times daily.  30 tablet  0  . pantoprazole (PROTONIX) 40 MG tablet Take 1 tablet (40 mg total) by mouth 2 (two) times daily.  60 tablet  0  . phenytoin (DILANTIN) 100 MG ER capsule Take 2-3 capsules (200-300 mg total) by mouth 2 (two) times daily. Take 2 capsules by mouth in the morning and 3 capsules by mouth at bedtime.  90 capsule  2  . potassium chloride (K-DUR) 10 MEQ tablet Take 1 tablet (10 mEq total) by mouth daily.  30 tablet   1  . simvastatin (ZOCOR) 40 MG tablet Take 1 tablet (40 mg total) by mouth every evening.  30 tablet  1  . spironolactone (ALDACTONE) 50 MG tablet Take 1 tablet (50 mg total) by mouth daily.  30 tablet  2  . tiZANidine (ZANAFLEX) 2 MG tablet Take 2 mg by mouth daily.         VITALS: Blood pressure 199/102, pulse 87, temperature 97.6 F (36.4 C), temperature source Oral, resp. rate 27, height 5' 5"  (1.651 m), weight 172 lb 6.4 oz (78.2 kg), SpO2 100.00%.  PHYSICAL EXAM: General appearance: alert and no distress Neck: JVD - 3 cm above sternal notch and no carotid bruit Lungs: diminished breath sounds bilaterally and rales bibasilar Heart: regular rate and rhythm Abdomen: soft, non-tender; bowel sounds normal; no masses,  no organomegaly Extremities: edema trace pedal edema bilaterally Pulses: 2+ and symmetric Skin: Skin color, texture, turgor normal. No rashes or lesions Neurologic: Grossly normal Psych: Pleasant  LABS: Results for orders placed during the hospital encounter of 08/11/13 (from the past 48 hour(s))  CBC WITH DIFFERENTIAL     Status: Abnormal   Collection Time    08/11/13  1:12 AM      Result Value Ref Range   WBC 4.7  4.0 - 10.5 K/uL   RBC 3.11 (*) 4.22 - 5.81 MIL/uL   Hemoglobin 8.5 (*) 13.0 - 17.0 g/dL   HCT 26.3 (*) 39.0 - 52.0 %   MCV 84.6  78.0 - 100.0 fL   MCH 27.3  26.0 - 34.0 pg   MCHC 32.3  30.0 - 36.0 g/dL   RDW 19.3 (*) 11.5 - 15.5 %   Platelets 250  150 -  400 K/uL   Neutrophils Relative % 65  43 - 77 %   Neutro Abs 3.1  1.7 - 7.7 K/uL   Lymphocytes Relative 21  12 - 46 %   Lymphs Abs 1.0  0.7 - 4.0 K/uL   Monocytes Relative 8  3 - 12 %   Monocytes Absolute 0.4  0.1 - 1.0 K/uL   Eosinophils Relative 6 (*) 0 - 5 %   Eosinophils Absolute 0.3  0.0 - 0.7 K/uL   Basophils Relative 0  0 - 1 %   Basophils Absolute 0.0  0.0 - 0.1 K/uL  COMPREHENSIVE METABOLIC PANEL     Status: Abnormal   Collection Time    08/11/13  1:12 AM      Result Value Ref  Range   Sodium 139  137 - 147 mEq/L   Potassium 4.2  3.7 - 5.3 mEq/L   Chloride 106  96 - 112 mEq/L   CO2 22  19 - 32 mEq/L   Glucose, Bld 85  70 - 99 mg/dL   BUN 18  6 - 23 mg/dL   Creatinine, Ser 0.99  0.50 - 1.35 mg/dL   Calcium 8.5  8.4 - 10.5 mg/dL   Total Protein 7.2  6.0 - 8.3 g/dL   Albumin 3.0 (*) 3.5 - 5.2 g/dL   AST 57 (*) 0 - 37 U/L   ALT 27  0 - 53 U/L   Alkaline Phosphatase 112  39 - 117 U/L   Total Bilirubin 0.3  0.3 - 1.2 mg/dL   GFR calc non Af Amer 82 (*) >90 mL/min   GFR calc Af Amer >90  >90 mL/min   Comment: (NOTE)     The eGFR has been calculated using the CKD EPI equation.     This calculation has not been validated in all clinical situations.     eGFR's persistently <90 mL/min signify possible Chronic Kidney     Disease.  PRO B NATRIURETIC PEPTIDE     Status: Abnormal   Collection Time    08/11/13  1:12 AM      Result Value Ref Range   Pro B Natriuretic peptide (BNP) 2998.0 (*) 0 - 125 pg/mL  I-STAT TROPOININ, ED     Status: None   Collection Time    08/11/13  1:27 AM      Result Value Ref Range   Troponin i, poc 0.08  0.00 - 0.08 ng/mL   Comment 3            Comment: Due to the release kinetics of cTnI,     a negative result within the first hours     of the onset of symptoms does not rule out     myocardial infarction with certainty.     If myocardial infarction is still suspected,     repeat the test at appropriate intervals.  GLUCOSE, CAPILLARY     Status: Abnormal   Collection Time    08/11/13  6:31 AM      Result Value Ref Range   Glucose-Capillary 123 (*) 70 - 99 mg/dL  TROPONIN I     Status: None   Collection Time    08/11/13  6:56 AM      Result Value Ref Range   Troponin I <0.30  <0.30 ng/mL   Comment:            Due to the release kinetics of cTnI,     a negative result within the  first hours     of the onset of symptoms does not rule out     myocardial infarction with certainty.     If myocardial infarction is still suspected,      repeat the test at appropriate intervals.    IMAGING: Dg Chest 2 View  08/11/2013   CLINICAL DATA:  Shortness of breath.  EXAM: CHEST  2 VIEW  COMPARISON:  Chest radiograph performed 08/02/2013  FINDINGS: The lungs are well-aerated. Vascular congestion is noted. Increased interstitial markings may reflect mild interstitial edema or possibly pneumonia. No pleural effusion or pneumothorax is seen.  The heart is enlarged. The patient is status post median sternotomy, with evidence of prior CABG. No acute osseous abnormalities are seen.  IMPRESSION: Vascular congestion and cardiomegaly. Increased interstitial markings may reflect mild interstitial edema or possibly pneumonia.   Electronically Signed   By: Garald Balding M.D.   On: 08/11/2013 01:57    HOSPITAL DIAGNOSES: Principal Problem:   Chest pain Active Problems:   Hypertension   Chronic systolic heart failure   CAD (coronary artery disease)   Ischemic cardiomyopathy   Chronic kidney disease (CKD), stage III (moderate)   Moderate to severe pulmonary hypertension   Seizure disorder   IMPRESSION: 1. Chest pain - coronary ischemia versus due to elevated LVEDP +/- recent cocaine use 2. Acute diastolic congestive heart failure 3. Hypertensive emergency  RECOMMENDATION: 1. Chest pain:  agree with CE's, would not heparinize given BP unless troponin comes back positive. Use nitroglycerin and morphine for chest pain. Plenty of room to uptitrate nitro. Ongoing cocaine use, would be hesistant to increase carvedilol. ?compliance with medications. 2. Acute diastolic congestive heart failure: He is 10-11 lbs over his dry weight - there is cough, basilar rales and elevated JVP - creatinine is normal (which is low for him), suggesting probably volume overload. Would recommend IV diuresis with lasix 40 IV BID - goal weight is around 161-162 lbs. Continue nitroglycerin and afterload reduction with hydralazine. 3. Hypertensive emergency:  IV  Nitrogylcerin, hydralazine, clonidine, low dose coreg and lisinopril.  Continue to titrate to SBP <140/90 if achievable.  Thanks for consulting [place]. Will follow with you.  Time Spent Directly with Patient: 45 minutes  Pixie Casino, MD, [facility] Attending Cardiologist [place] 08/11/2013, 8:58 AM

## 2013-08-11 NOTE — ED Notes (Signed)
Patient began having SOB at 9pm this evening when he laid down. Patient has hx of CHF. Patient has bilateral swelling to feet with palpable pulses. Patient has been coughing up white sputum x1 month. Patient is having chest wall pain that is 10/10 with deep inspiration and palpation. No radiation. Patient has a scar from a CABG that he states is always tender. 180/100, 82 NSR, 16RR 99% on RA a/o x4

## 2013-08-11 NOTE — Progress Notes (Addendum)
[facility] PROGRESS NOTE  Charles [name] ONG:[id number] DOB: 01/07/1946 DOA: 08/11/2013 PCP: [name]  Assessment/Plan: 1. Chest pain -He presents with chest pain having typical and atypical features, describing retrosternal chest pain radiating to his left arm and jaw, improved with nitrates. -Initial troponin negative, EKG did not reveal ischemic changes -Will continue close monitoring in the step down unit, cycled troponins, continue aspirin, ACE inhibitor, beta blocker and statin therapy -Cardiology consultation -It is conceivable that cocaine abuse may have precipitated chest pain symptoms although he reports not using since last week.  2.  History of chronic systolic and diastolic congestive heart failure -He appears compensated -Recently treated for CHF exacerbation -Status post transthoracic echocardiogram on 07/16/2013 that showed an ejection fraction of 50-55%, mild hypokinesis of the basal-mid inferior lateral and inferior myocardium, grade 2 diastolic dysfunction -Continue Lasix 40 mg by mouth daily as prolactin 50 mg by mouth daily  3. Hypertensive emergency -Patient started on a nitroglycerin drip overnight -Blood pressures remaining elevated -It is possible cocaine abuse/medication nonadherence may have precipitated hypertensive urgency -Will increase to amlodipine 10 mg daily and clonidine 0.2 mg BID, continue Lasix 40 mg daily and lisinopril 20 mg twice a day.   4. Cocaine abuse -Patient counseled on cocaine abuse and its negative effects on his heart -States doing smoking crack 1 week ago -He states "I'm not going to do that anymore."  Code Status: Full Code Family Communication:  Disposition Plan: Admit to SDU   Consultants:  Cardiology   HPI/Subjective: Patient is a 68 year old gentleman with a past medical history of coronary artery disease status post three-vessel coronary artery bypass grafting in 2009, ischemic cardiomyopathy, chronic  systolic and diastolic congestive heart failure, history of cocaine abuse (testing positive for cocaine in May and April of 2015 on recent hospitalizations) admitted early this morning presenting with chest pain. Patient found to be hypertensive was started on a nitroglycerin drip and admitted to the step unit.  Objective: Filed Vitals:   08/11/13 0720  BP: 199/102  Pulse: 87  Temp: 97.6 F (36.4 C)  Resp:     Intake/Output Summary (Last 24 hours) at 08/11/13 0804 Last data filed at 08/11/13 0700  Gross per 24 hour  Intake   2.23 ml  Output    600 ml  Net -597.77 ml   Filed Weights   08/11/13 4401  Weight: 78.2 kg (172 lb 6.4 oz)    Exam:   General:  No acute distress states CP improved  Cardiovascular: Regular rate and rhythm, normal S1S2  Respiratory: Normal inspiratory effort, few bibasilar crackles  Abdomen: Soft, nontender, nondistended  Musculoskeletal: trance edema to lower extremities  Data Reviewed: Basic Metabolic Panel:  Recent Labs Lab 08/11/13 0112  NA 139  K 4.2  CL 106  CO2 22  GLUCOSE 85  BUN 18  CREATININE 0.99  CALCIUM 8.5   Liver Function Tests:  Recent Labs Lab 08/11/13 0112  AST 57*  ALT 27  ALKPHOS 112  BILITOT 0.3  PROT 7.2  ALBUMIN 3.0*   No results found for this basename: LIPASE, AMYLASE,  in the last 168 hours No results found for this basename: AMMONIA,  in the last 168 hours CBC:  Recent Labs Lab 08/11/13 0112  WBC 4.7  NEUTROABS 3.1  HGB 8.5*  HCT 26.3*  MCV 84.6  PLT 250   Cardiac Enzymes:  Recent Labs Lab 08/11/13 0656  TROPONINI <0.30   BNP (last 3 results)  Recent Labs  07/19/13 0430  08/02/13 0808 08/11/13 0112  PROBNP 981.3* 1930.0* 2998.0*   CBG:  Recent Labs Lab 08/11/13 0631  GLUCAP 123*    No results found for this or any previous visit (from the past 240 hour(s)).   Studies: Dg Chest 2 View  08/11/2013   CLINICAL DATA:  Shortness of breath.  EXAM: CHEST  2 VIEW   COMPARISON:  Chest radiograph performed 08/02/2013  FINDINGS: The lungs are well-aerated. Vascular congestion is noted. Increased interstitial markings may reflect mild interstitial edema or possibly pneumonia. No pleural effusion or pneumothorax is seen.  The heart is enlarged. The patient is status post median sternotomy, with evidence of prior CABG. No acute osseous abnormalities are seen.  IMPRESSION: Vascular congestion and cardiomegaly. Increased interstitial markings may reflect mild interstitial edema or possibly pneumonia.   Electronically Signed   By: Garald Balding M.D.   On: 08/11/2013 01:57    Scheduled Meds: . amLODipine  5 mg Oral Daily  . aspirin EC  81 mg Oral Daily  . carvedilol  3.125 mg Oral BID WC  . cloNIDine  0.2 mg Oral Daily  . divalproex  500 mg Oral BID  . enoxaparin (LOVENOX) injection  40 mg Subcutaneous Q24H  . ferrous sulfate  325 mg Oral BID WC  . furosemide  40 mg Oral Daily  . gabapentin  100 mg Oral BID  . hydrALAZINE  100 mg Oral TID  . lisinopril  20 mg Oral BID  . pantoprazole  40 mg Oral BID  . phenytoin  200-300 mg Oral BID  . simvastatin  40 mg Oral QPM  . spironolactone  50 mg Oral Daily  . tiZANidine  2 mg Oral Daily   Continuous Infusions: . nitroGLYCERIN 15 mcg/min (08/11/13 0700)    Principal Problem:   Chest pain Active Problems:   Hypertension   Chronic systolic heart failure   CAD (coronary artery disease)   Ischemic cardiomyopathy   Chronic kidney disease (CKD), stage III (moderate)   Moderate to severe pulmonary hypertension   Seizure disorder    Time spent: 35 min    [place] [facility] Pager [phone]. If 7PM-7AM, please contact night-coverage at [email or web address], password [facility] 08/11/2013, 8:04 AM  LOS: 0 days

## 2013-08-12 DIAGNOSIS — D649 Anemia, unspecified: Secondary | ICD-10-CM

## 2013-08-12 DIAGNOSIS — F1910 Other psychoactive substance abuse, uncomplicated: Secondary | ICD-10-CM

## 2013-08-12 DIAGNOSIS — I5043 Acute on chronic combined systolic (congestive) and diastolic (congestive) heart failure: Principal | ICD-10-CM

## 2013-08-12 DIAGNOSIS — Q2733 Arteriovenous malformation of digestive system vessel: Secondary | ICD-10-CM

## 2013-08-12 DIAGNOSIS — R569 Unspecified convulsions: Secondary | ICD-10-CM

## 2013-08-12 DIAGNOSIS — I10 Essential (primary) hypertension: Secondary | ICD-10-CM

## 2013-08-12 DIAGNOSIS — K922 Gastrointestinal hemorrhage, unspecified: Secondary | ICD-10-CM

## 2013-08-12 MED ORDER — ISOSORBIDE MONONITRATE ER 60 MG PO TB24
60.0000 mg | ORAL_TABLET | Freq: Every day | ORAL | Status: DC
  Administered 2013-08-12 – 2013-08-14 (×3): 60 mg via ORAL
  Filled 2013-08-12 (×4): qty 1

## 2013-08-12 MED ORDER — FUROSEMIDE 10 MG/ML IJ SOLN
80.0000 mg | Freq: Two times a day (BID) | INTRAMUSCULAR | Status: DC
  Administered 2013-08-12 – 2013-08-15 (×7): 80 mg via INTRAVENOUS
  Filled 2013-08-12 (×9): qty 8

## 2013-08-12 NOTE — Progress Notes (Signed)
TELEMETRY: Reviewed telemetry pt in NSR: Filed Vitals:   08/12/13 0850 08/12/13 1000 08/12/13 1001 08/12/13 1226  BP:  162/98 162/98 151/111  Pulse: 66 62    Temp:    98.2 F (36.8 C)  TempSrc:    Oral  Resp:    19  Height:      Weight:      SpO2:  100%  100%    Intake/Output Summary (Last 24 hours) at 08/12/13 1350 Last data filed at 08/12/13 1000  Gross per 24 hour  Intake 1210.5 ml  Output   1200 ml  Net   10.5 ml   Filed Weights   08/11/13 0632 08/12/13 0400  Weight: 172 lb 6.4 oz (78.2 kg) 173 lb 8 oz (78.7 kg)    Subjective States he feels better. Breathing improved. Still has intermittent sharp stabbing pain in the left precordium.  [name] amLODipine  10 mg Oral Daily  . aspirin EC  81 mg Oral Daily  . atorvastatin  20 mg Oral q1800  . carvedilol  3.125 mg Oral BID WC  . Chlorhexidine Gluconate Cloth  6 each Topical Q0600  . cloNIDine  0.2 mg Oral BID  . divalproex  500 mg Oral BID  . enoxaparin (LOVENOX) injection  40 mg Subcutaneous Q24H  . ferrous sulfate  325 mg Oral BID WC  . furosemide  40 mg Intravenous BID  . gabapentin  100 mg Oral BID  . hydrALAZINE  100 mg Oral TID  . isosorbide mononitrate  60 mg Oral Daily  . lisinopril  20 mg Oral BID  . mupirocin ointment  1 application Nasal BID  . pantoprazole  40 mg Oral BID  . phenytoin  200 mg Oral q morning - 10a  . phenytoin  300 mg Oral QHS  . spironolactone  50 mg Oral Daily  . tiZANidine  2 mg Oral Daily     LABS: Basic Metabolic Panel:  Recent Labs  08/11/13 0112  NA 139  K 4.2  CL 106  CO2 22  GLUCOSE 85  BUN 18  CREATININE 0.99  CALCIUM 8.5   Liver Function Tests:  Recent Labs  08/11/13 0112  AST 57*  ALT 27  ALKPHOS 112  BILITOT 0.3  PROT 7.2  ALBUMIN 3.0*   No results found for this basename: LIPASE, AMYLASE,  in the last 72 hours CBC:  Recent Labs  08/11/13 0112  WBC 4.7  NEUTROABS 3.1  HGB 8.5*  HCT 26.3*  MCV 84.6  PLT 250   Cardiac Enzymes:  Recent  Labs  08/11/13 0656 08/11/13 0827 08/11/13 1128  TROPONINI <0.30 <0.30 <0.30   BNP:  Recent Labs  08/11/13 0112  PROBNP 2998.0*     Radiology/Studies:  Dg Chest 2 View  08/11/2013   CLINICAL DATA:  Shortness of breath.  EXAM: CHEST  2 VIEW  COMPARISON:  Chest radiograph performed 08/02/2013  FINDINGS: The lungs are well-aerated. Vascular congestion is noted. Increased interstitial markings may reflect mild interstitial edema or possibly pneumonia. No pleural effusion or pneumothorax is seen.  The heart is enlarged. The patient is status post median sternotomy, with evidence of prior CABG. No acute osseous abnormalities are seen.  IMPRESSION: Vascular congestion and cardiomegaly. Increased interstitial markings may reflect mild interstitial edema or possibly pneumonia.   Electronically Signed   By: Garald Balding M.D.   On: 08/11/2013 01:57   Ecg: NSR with old inferoposterior infarct. No acute change.  Echo: 4/30/15Study Conclusions  - Left  ventricle: The cavity size was normal. Systolic function was normal. The estimated ejection fraction was in the range of 50% to 55%. Possible mild hypokinesis of the basal-midinferolateral and inferior myocardium. Features are consistent with a pseudonormal left ventricular filling pattern, with concomitant abnormal relaxation and increased filling pressure (grade 2 diastolic dysfunction). - Left atrium: The atrium was mildly dilated. - Right ventricle: The cavity size was mildly dilated. Wall thickness was normal. Systolic function was mildly reduced. - Pulmonary arteries: Systolic pressure was moderately increased. PA peak pressure: 63mm Hg (S). Impressions:  - Comparison with 2012 study shows a similar distribution of the wall motion abnormality (current study shows interval improvement).  MYOCARDIAL IMAGING WITH SPECT (REST AND PHARMACOLOGIC-STRESS)  GATED LEFT VENTRICULAR WALL MOTION STUDY  LEFT VENTRICULAR EJECTION FRACTION    TECHNIQUE:  Standard myocardial SPECT imaging was performed after resting  intravenous injection of 10 mCi Tc-42m sestamibi. Subsequently,  intravenous infusion of Lexiscan was performed under the supervision  of the Cardiology staff. At peak effect of the drug, 30 mCi Tc-44m  sestamibi was injected intravenously and standard myocardial SPECT  imaging was performed. Quantitative gated imaging was also performed  to evaluate left ventricular wall motion, and estimate left  ventricular ejection fraction.  COMPARISON: None.  FINDINGS:  Utilizing gated data, the end-diastolic volume is estimated to be  179 mL and the end systolic volume 161 mL. Calculated ejection  fraction is 41%.  Gated wall motion analysis shows mild hypokinesis of the inferior  wall and septum as well as a component of mild global hypokinesis.  No akinetic or dyskinetic segments are identified.  SPECT imaging demonstrates inferior wall scar as well as probable  scar at the level of the mid anterior wall. There is no evidence of  inducible myocardial ischemia.  IMPRESSION:  Inferior and anterior wall scar. No evidence of inducible myocardial  ischemia. Quantitative ejection fraction of 41% with septal and  inferior hypokinesis present.  Electronically Signed  By: Aletta Edouard M.D.  On: 03/12/2013 12:49   PHYSICAL EXAM General: Well developed, well nourished, in no acute distress. Head: Normocephalic, atraumatic, sclera non-icteric, oropharynx is clear Neck: Negative for carotid bruits. JVD not elevated. No adenopathy Lungs: Mild bibasilar rales. Breathing is unlabored. Heart: RRR S1 S2 without murmurs, rubs, or gallops.  Abdomen: Soft, non-tender, non-distended with normoactive bowel sounds. No hepatomegaly. No rebound/guarding. No obvious abdominal masses. Msk:  Strength and tone appears normal for age. Extremities: 1+ edema.  Distal pedal pulses are 2+ and equal bilaterally. Neuro: Alert and oriented X 3.  Moves all extremities spontaneously. Psych:  Responds to questions appropriately with a normal affect.  ASSESSMENT AND PLAN: 1. Acute on chronic diastolic CHF. Weight is up 11-12 lbs since last admission in April. Respond to IV lasix so far has been lackluster. Will increase lasix to 80 mg bid. Continue other therapy with Nitrates, hydralazine, aldactone and low dose coreg. Patient is clearly getting too much sodium in diet. States he eats a lot of processed foods like hot dogs, baloney, burritos.   2. Atypical chest pain. Patient has ruled out for infarct. Pain may be aggravated by fluid overload. I would not be anxious to perform cardiac cath in the absence of objective evidence of ischemia given history of cocaine abuse. Myoview in December showed no ischemia. Continue medical management. Agree with transitioning nitrates to po.  3. HTN. On multiple medications at maximal doses. Compliance with medications and diet stressed. May improve with further diuresis.  4. Chronic  anemia  5. History of cocaine abuse.   Present on Admission:  . Chest pain . Hypertension . Chronic systolic heart failure . CAD (coronary artery disease) . Ischemic cardiomyopathy . Chronic kidney disease (CKD), stage III (moderate) . Moderate to severe pulmonary hypertension . Seizure disorder  Signed, Kanin Lia M [place], [place] 08/12/2013 1:50 PM

## 2013-08-12 NOTE — Plan of Care (Signed)
Problem: Food- and Nutrition-Related Knowledge Deficit (NB-1.1) Goal: Nutrition education Formal process to instruct or train a patient/client in a skill or to impart knowledge to help patients/clients voluntarily manage or modify food choices and eating behavior to maintain or improve health. Outcome: Progressing Nutrition Education Note  RD consulted for nutrition education regarding a Heart Healthy diet.   Lipid Panel     Component Value Date/Time    CHOL 108 06/14/2012 1120    TRIG 85 06/14/2012 1120    HDL 43 06/14/2012 1120    CHOLHDL 2.5 06/14/2012 1120    VLDL 17 06/14/2012 1120    LDLCALC 48 06/14/2012 1120    RD provided "Heart Healthy Nutrition Therapy" handout from the Academy [employer]. Reviewed patient's dietary recall. Pt states he is already following a Heart Healthy diet.  He does not eat canned food with exception of sardines.  Pt states he has been following a Heart healthy diet "for a while."   Discouraged intake of processed foods and use of salt shaker. Encouraged fresh fruits and vegetables as well as whole grain sources of carbohydrates to maximize fiber intake. Teach back method used.  Pt requests a case manager to see him.   RD notified RN.   Expect good compliance.  Body mass index is 28.87 kg/(m^2). Pt meets criteria for overweight based on current BMI.  Current diet order is Heart Healthy, patient is consuming approximately 100% of meals at this time. Labs and medications reviewed. No further nutrition interventions warranted at this time. RD contact information provided. If additional nutrition issues arise, please re-consult RD.  Brynda Greathouse, MS RD LDN Clinical Inpatient Dietitian Pager: [phone] Weekend/After hours pager: [phone]

## 2013-08-12 NOTE — Progress Notes (Signed)
Discussed with case management regarding pt's request to have  a place for him here in [place] area,  near the hospital, latter claimed to refer him to [facility].

## 2013-08-12 NOTE — Progress Notes (Signed)
[facility]  Charles [name] PJK:[id number] DOB: 10-Jul-1945 DOA: 08/11/2013 PCP: [name]  Time spent :  Brief narrative: 68 year old gentleman with a past medical history of coronary artery disease status post three-vessel coronary artery bypass grafting in 2009, ischemic cardiomyopathy, chronic systolic and diastolic congestive heart failure, history of cocaine abuse (testing positive for cocaine in May and April of 2015 on recent hospitalizations) admitted early this morning presenting with chest pain. Patient found to be hypertensive was started on a nitroglycerin drip  HPI/Subjective: Continues with CP in setting of negative cardiac enzymes. No associated symptoms.   Assessment/Plan: Chest pain -typical and atypical features, describing retrosternal chest pain radiating to his left arm and jaw, improved with nitrates.  -Troponins have been negative, EKG did not reveal ischemic changes  -continue aspirin, ACE inhibitor,and statin therapy  -cautious use of coreg in setting of ongoing cocaine qbuse -Cardiology following -It is conceivable that cocaine abuse may have precipitated chest pain symptoms although he reports not using since last week.  -transition IV NTG to Imdur and given poor BP control and known pulmonary HTN continue after dc- use generic -needs to FU with cardiolgist in [place]-says does not have transportation to MD appts in [place]  History of chronic systolic and diastolic congestive heart failure  -He appears compensated although big discrepancy in weights as entered in EPIC -Recently treated for CHF exacerbation  -TTE on 07/16/2013 that showed an ejection fraction of 50-55%, mild hypokinesis of the basal-mid inferior lateral and inferior myocardium, grade 2 diastolic dysfunction  -Continue Lasix 40 mg by mouth daily and Aldactone 50 mg by mouth daily   Hypertensive emergency  -Patient started on a nitroglycerin drip  and plan is to transition to PO Imdur -Blood pressures remain elevated  -It is possible cocaine abuse/medication nonadherence may have precipitated hypertensive urgency  -Increased amlodipine to 10 mg daily and continued home dose clonidine 0.2 mg BID, continued Lasix 40 mg daily and lisinopril 20 mg twice a day.   Cocaine abuse  -Patient counseled on cocaine abuse and its negative effects on his heart  -admitted to smoking crack 1 week ago  -He stated to previous MD "I'm not going to do that anymore."  Recent gastric antrum AVM with bleeding /anemia -current Hgb stable >8.5     DVT prophylaxis: Lovenox Code Status: Full Family Communication: Patient Disposition Plan/Expected LOS: Step down   Consultants: Cardiology  Procedures: None  Antibiotics: None  Objective: Blood pressure 162/98, pulse 62, temperature 98.8 F (37.1 C), temperature source Oral, resp. rate 18, height 5\' 5"  (1.651 m), weight 173 lb 8 oz (78.7 kg), SpO2 100.00%.  Intake/Output Summary (Last 24 hours) at 08/12/13 1136 Last data filed at 08/12/13 1000  Gross per 24 hour  Intake 1225.5 ml  Output   1200 ml  Net   25.5 ml     Exam: General: No acute respiratory distress Lungs: Clear to auscultation bilaterally without wheezes or crackles, RA 100% Cardiovascular: Regular rate and rhythm without murmur gallop or rub normal S1 and S2, no peripheral edema or JVD Abdomen: Nontender, nondistended, soft, bowel sounds positive, no rebound, no ascites, no appreciable mass Musculoskeletal: No significant cyanosis, clubbing of bilateral lower extremities   Scheduled Meds:  Scheduled Meds: . amLODipine  10 mg Oral Daily  . aspirin EC  81 mg Oral Daily  . atorvastatin  20 mg Oral q1800  . carvedilol  3.125 mg Oral BID WC  .  Chlorhexidine Gluconate Cloth  6 each Topical Q0600  . cloNIDine  0.2 mg Oral BID  . divalproex  500 mg Oral BID  . enoxaparin (LOVENOX) injection  40 mg Subcutaneous Q24H  .  ferrous sulfate  325 mg Oral BID WC  . furosemide  40 mg Intravenous BID  . gabapentin  100 mg Oral BID  . hydrALAZINE  100 mg Oral TID  . isosorbide mononitrate  60 mg Oral Daily  . lisinopril  20 mg Oral BID  . mupirocin ointment  1 application Nasal BID  . pantoprazole  40 mg Oral BID  . phenytoin  200 mg Oral q morning - 10a  . phenytoin  300 mg Oral QHS  . spironolactone  50 mg Oral Daily  . tiZANidine  2 mg Oral Daily   Continuous Infusions:   Data Reviewed: Basic Metabolic Panel:  Recent Labs Lab 08/11/13 0112  NA 139  K 4.2  CL 106  CO2 22  GLUCOSE 85  BUN 18  CREATININE 0.99  CALCIUM 8.5   Liver Function Tests:  Recent Labs Lab 08/11/13 0112  AST 57*  ALT 27  ALKPHOS 112  BILITOT 0.3  PROT 7.2  ALBUMIN 3.0*   No results found for this basename: LIPASE, AMYLASE,  in the last 168 hours No results found for this basename: AMMONIA,  in the last 168 hours CBC:  Recent Labs Lab 08/11/13 0112  WBC 4.7  NEUTROABS 3.1  HGB 8.5*  HCT 26.3*  MCV 84.6  PLT 250   Cardiac Enzymes:  Recent Labs Lab 08/11/13 0656 08/11/13 0827 08/11/13 1128  TROPONINI <0.30 <0.30 <0.30   BNP (last 3 results)  Recent Labs  07/19/13 0430 08/02/13 0808 08/11/13 0112  PROBNP 981.3* 1930.0* 2998.0*   CBG:  Recent Labs Lab 08/11/13 0631  GLUCAP 123*    Recent Results (from the past 240 hour(s))  MRSA PCR SCREENING     Status: Abnormal   Collection Time    08/11/13  9:41 AM      Result Value Ref Range Status   MRSA by PCR POSITIVE (*) NEGATIVE Final   Comment:            The GeneXpert MRSA Assay (FDA     approved for NASAL specimens     only), is one component of a     comprehensive MRSA colonization     surveillance program. It is not     intended to diagnose MRSA     infection nor to guide or     monitor treatment for     MRSA infections.     RESULT CALLED TO, READ BACK BY AND VERIFIED WITH:     CARAHER RN 11:30 08/11/13 ([email or web address])      Studies:  Recent x-ray studies have been reviewed in detail by the Attending Physician       Erin Hearing, [place] [facility] Office  [phone] Pager [phone]   **If unable to reach the above provider after paging please contact the [facility] @ [phone]  On-Call/Text Page:      [email or web address]      password [email or web address]  If 7PM-7AM, please contact night-coverage [email or web address] Password [email or web address] 08/12/2013, 11:36 AM   LOS: 1 day   Examed patient with ANP Ebony Hail discussed the assessment and plan and agree with the above plan. Discuss plan with patient and all questions were answered. The patient has multiple organ systems failure and requires high complexity decision making for assessment and support, frequent  evaluation and titration of therapies, application of advanced monitoring technologies and extensive interpretation of multiple databases. Time devoted to patient care services described in this note is 35 minutes.

## 2013-08-13 LAB — BASIC METABOLIC PANEL
BUN: 21 mg/dL (ref 6–23)
CALCIUM: 9 mg/dL (ref 8.4–10.5)
CO2: 27 mEq/L (ref 19–32)
CREATININE: 1.33 mg/dL (ref 0.50–1.35)
Chloride: 95 mEq/L — ABNORMAL LOW (ref 96–112)
GFR calc non Af Amer: 53 mL/min — ABNORMAL LOW (ref >90)
GFR, EST AFRICAN AMERICAN: 61 mL/min — AB (ref >90)
Glucose, Bld: 92 mg/dL (ref 70–99)
POTASSIUM: 4.4 meq/L (ref 3.7–5.3)
Sodium: 135 mEq/L — ABNORMAL LOW (ref 137–147)

## 2013-08-13 NOTE — Progress Notes (Signed)
TELEMETRY: Reviewed telemetry pt in NSR, occ PVCs couplets, triplets: Filed Vitals:   08/13/13 0051 08/13/13 0500 08/13/13 0513 08/13/13 0730  BP:   166/61 159/78  Pulse:   80 61  Temp: 98.1 F (36.7 C) 97.4 F (36.3 C)    TempSrc: Oral Oral    Resp:   18 17  Height:      Weight:      SpO2:  98% 98% 97%    Intake/Output Summary (Last 24 hours) at 08/13/13 0837 Last data filed at 08/13/13 0730  Gross per 24 hour  Intake  607.5 ml  Output   1600 ml  Net -992.5 ml   Filed Weights   08/11/13 0632 08/12/13 0400  Weight: 172 lb 6.4 oz (78.2 kg) 173 lb 8 oz (78.7 kg)    Subjective States he feels better. Breathing improved. Still has intermittent sharp stabbing pain in the left precordium.  [name] amLODipine  10 mg Oral Daily  . aspirin EC  81 mg Oral Daily  . atorvastatin  20 mg Oral q1800  . carvedilol  3.125 mg Oral BID WC  . Chlorhexidine Gluconate Cloth  6 each Topical Q0600  . cloNIDine  0.2 mg Oral BID  . divalproex  500 mg Oral BID  . enoxaparin (LOVENOX) injection  40 mg Subcutaneous Q24H  . ferrous sulfate  325 mg Oral BID WC  . furosemide  80 mg Intravenous BID  . gabapentin  100 mg Oral BID  . hydrALAZINE  100 mg Oral TID  . isosorbide mononitrate  60 mg Oral Daily  . lisinopril  20 mg Oral BID  . mupirocin ointment  1 application Nasal BID  . pantoprazole  40 mg Oral BID  . phenytoin  200 mg Oral q morning - 10a  . phenytoin  300 mg Oral QHS  . spironolactone  50 mg Oral Daily  . tiZANidine  2 mg Oral Daily     LABS: Basic Metabolic Panel:  Recent Labs  08/11/13 0112  NA 139  K 4.2  CL 106  CO2 22  GLUCOSE 85  BUN 18  CREATININE 0.99  CALCIUM 8.5   Liver Function Tests:  Recent Labs  08/11/13 0112  AST 57*  ALT 27  ALKPHOS 112  BILITOT 0.3  PROT 7.2  ALBUMIN 3.0*   No results found for this basename: LIPASE, AMYLASE,  in the last 72 hours CBC:  Recent Labs  08/11/13 0112  WBC 4.7  NEUTROABS 3.1  HGB 8.5*  HCT 26.3*  MCV  84.6  PLT 250   Cardiac Enzymes:  Recent Labs  08/11/13 0656 08/11/13 0827 08/11/13 1128  TROPONINI <0.30 <0.30 <0.30   BNP:  Recent Labs  08/11/13 0112  PROBNP 2998.0*     Radiology/Studies:  No results found. Ecg: NSR with old inferoposterior infarct. No acute change.  Echo: 4/30/15Study Conclusions  - Left ventricle: The cavity size was normal. Systolic function was normal. The estimated ejection fraction was in the range of 50% to 55%. Possible mild hypokinesis of the basal-midinferolateral and inferior myocardium. Features are consistent with a pseudonormal left ventricular filling pattern, with concomitant abnormal relaxation and increased filling pressure (grade 2 diastolic dysfunction). - Left atrium: The atrium was mildly dilated. - Right ventricle: The cavity size was mildly dilated. Wall thickness was normal. Systolic function was mildly reduced. - Pulmonary arteries: Systolic pressure was moderately increased. PA peak pressure: 62mm Hg (S). Impressions:  - Comparison with 2012 study shows a similar distribution  of the wall motion abnormality (current study shows interval improvement).  MYOCARDIAL IMAGING WITH SPECT (REST AND PHARMACOLOGIC-STRESS)  GATED LEFT VENTRICULAR WALL MOTION STUDY  LEFT VENTRICULAR EJECTION FRACTION  TECHNIQUE:  Standard myocardial SPECT imaging was performed after resting  intravenous injection of 10 mCi Tc-74m sestamibi. Subsequently,  intravenous infusion of Lexiscan was performed under the supervision  of the Cardiology staff. At peak effect of the drug, 30 mCi Tc-57m  sestamibi was injected intravenously and standard myocardial SPECT  imaging was performed. Quantitative gated imaging was also performed  to evaluate left ventricular wall motion, and estimate left  ventricular ejection fraction.  COMPARISON: None.  FINDINGS:  Utilizing gated data, the end-diastolic volume is estimated to be  179 mL and the end  systolic volume 063 mL. Calculated ejection  fraction is 41%.  Gated wall motion analysis shows mild hypokinesis of the inferior  wall and septum as well as a component of mild global hypokinesis.  No akinetic or dyskinetic segments are identified.  SPECT imaging demonstrates inferior wall scar as well as probable  scar at the level of the mid anterior wall. There is no evidence of  inducible myocardial ischemia.  IMPRESSION:  Inferior and anterior wall scar. No evidence of inducible myocardial  ischemia. Quantitative ejection fraction of 41% with septal and  inferior hypokinesis present.  Electronically Signed  By: Aletta Edouard M.D.  On: 03/12/2013 12:49   PHYSICAL EXAM General: Well developed, well nourished, in no acute distress. Head: Normocephalic, atraumatic, sclera non-icteric, oropharynx is clear Neck: Negative for carotid bruits. JVD not elevated. No adenopathy Lungs: Mild bibasilar rales. Breathing is unlabored. Heart: RRR S1 S2 without murmurs, rubs, or gallops.  Abdomen: Soft, non-tender, non-distended with normoactive bowel sounds. No hepatomegaly. No rebound/guarding. No obvious abdominal masses. Msk:  Strength and tone appears normal for age. Extremities: 1+ edema.  Distal pedal pulses are 2+ and equal bilaterally. Neuro: Alert and oriented X 3. Moves all extremities spontaneously. Psych:  Responds to questions appropriately with a normal affect.  ASSESSMENT AND PLAN: 1. Acute on chronic diastolic CHF. Weight is up 11-12 lbs since last admission in April. Responded better to  IV lasix since dose increased yesterday. No weight recorded today. I/O negative 977.  Continue lasix to 80 mg bid. Continue other therapy with Nitrates, hydralazine, aldactone and low dose coreg. When he is close to dry weight of 162 lbs can switch Lasix to po but will require a higher dose than before to maintain weight. Clearly needs to do better with diet since he eats a lot of processed meats.     2. Atypical chest pain. Patient has ruled out for infarct. Pain may be aggravated by fluid overload. I would not be anxious to perform cardiac cath in the absence of objective evidence of ischemia given history of cocaine abuse. Myoview in December showed no ischemia. Continue medical management. Agree with transitioning nitrates to po.  3. HTN. On multiple medications at maximal doses. Compliance with medications and diet stressed. May improve with further diuresis.  4. Chronic anemia  5. History of cocaine abuse.   OK to transfer to telemetry today from cardiac standpoint. No labs ordered. Need to follow BMET daily.  Present on Admission:  . Chest pain . Hypertension . Chronic systolic heart failure . CAD (coronary artery disease) . Ischemic cardiomyopathy . Chronic kidney disease (CKD), stage III (moderate) . Moderate to severe pulmonary hypertension . Seizure disorder  Signed, Mosiah Bastin M [place], [place] 08/13/2013 8:37 AM

## 2013-08-13 NOTE — Progress Notes (Signed)
Clinical Social Work Department BRIEF PSYCHOSOCIAL ASSESSMENT 08/13/2013  Patient:  Charles [name], Charles [name]     Account Number:  [id number]     Admit date:  08/11/2013  Clinical Social Worker:  Freeman Caldron  Date/Time:  08/13/2013 03:40 PM  Referred by:  Physician  Date Referred:  08/13/2013 Referred for  Homelessness   Other Referral:   Interview type:  Patient Other interview type:    PSYCHOSOCIAL DATA Living Status:  FRIEND(S) Admitted from facility:   Level of care:   Primary support name:  Cherlynn June [phone]) Primary support relationship to patient:  FRIEND Degree of support available:   Fair--pt states he lives with a woman who is "like a daughter to [him]"    CURRENT CONCERNS Current Concerns  Other - See comment   Other Concerns:   Pt requesting housing information    SOCIAL WORK ASSESSMENT / PLAN CSW introduced self and pt explained he is seeking housing closer to the hospital. Pt currently lives with a friend who is "like a daughter to [him]." Pt states he is on a fixed income and wants an apartment in [place]. CSW provided phone number to [employer] and list of homeless shelters. CSW provided active listening as pt spoke about hospitalizations.   Assessment/plan status:  Psychosocial Support/Ongoing Assessment of Needs Other assessment/ plan:   Information/referral to community resources:   Presenter, broadcasting and phone number for [employer]    PATIENT'S/FAMILY'S RESPONSE TO PLAN OF CARE: Good--pt participated in conversation with CSW. No further CSW needs identified; CSW signing off.       Ky Barban, MSW, [facility] Clinical Social Worker [phone]

## 2013-08-13 NOTE — Progress Notes (Signed)
Pt continues to c/o chest pain with pain radiating down left arm - MD has been repeatedly notified and updated - PRN morphine given per MD order.  Pt states he's "doing ok" - no c/o SOB or pain elsewhere.  Pt states his priority is to move to [facility] because he's "tired of living in [facility]" and wants to be "closer to this hospital".  Pt states he would like to talk with SW some time today about housing placement options and states he "would like a house near the hospital".  SW contacted and advised of pt's request - SW states she will be by to see the patient sometime today, most likely in the afternoon to speak with pt and offer shelter list for the [place] area.  Pt advised of SW's plan to see him later this afternoon - pt verbalizes understanding and has no further questions or concerns at this time.  Pt sitting side of bed drinking decaf coffee.  Will continue to closely monitor.

## 2013-08-13 NOTE — Progress Notes (Signed)
Pt advised that he will be transferring to 3E when a bed becomes available - no questions or concerns at this time and no c/o pain or SOB.  Report given to Tiffany, RN on 3E - confirmed pt going to room 10.  Pt currently resting with eyes closed - no acute issues upon transfer via wheelchair.

## 2013-08-13 NOTE — Progress Notes (Addendum)
[facility]  Charles [name] OAC:[id number] DOB: [birth year] DOA: 08/11/2013 PCP: [name]  Time spent :  35 minutes  Brief narrative: 67 year old M with a past medical history of coronary artery disease status post three-vessel coronary artery bypass grafting in 2009, ischemic cardiomyopathy, chronic systolic and diastolic congestive heart failure, history of cocaine abuse (testing positive for cocaine in May and April of 2015) admitted with chest pain. Patient found to be hypertensive > was started on a nitroglycerin drip  HPI/Subjective: Denied CP at rest to IM team or to cardiologist - later c/o "classic" CP to RN without associated sx's  Assessment/Plan:  Chest pain -typical and atypical features, describing retrosternal chest pain radiating to his left arm and jaw, improved with nitrates.  -Troponins have been negative, EKG did not reveal ischemic changes and CARDIOLOGY does not PLAN cardiac cath in absence of objective evidence of ischemia given h/o cocaine abuse- also Myoview in December showed NO ischemia-plan is to continue med rxn including PO nitrate which has been added this admission -continue aspirin, ACE inhibitor,and statin therapy  -cautious use of coreg in setting of ongoing cocaine use (though drug screen not done this admission) -Cardiology following -needs to FU with cardiolgist in [place]-says does not have transportation to MD appts in [place] pt asking for SW assistance in finding housing so can move to [place]  History of chronic systolic and diastolic congestive heart failure  -He appears compensated although big discrepancy in weights as entered in EPIC -TTE on 07/16/2013 that showed an ejection fraction of 50-55%, mild hypokinesis of the basal-mid inferior lateral and inferior myocardium, grade 2 diastolic dysfunction  -Continue Lasix and Aldactone   -slow response to diuresis thus far-weight was up 10 lbs  at admission -dietary indiscretion major factor: eats processed foods such as baloney and burritos at home  Hypertensive emergency  -resolved -Patient initially started on a nitroglycerin drip - plan is to transition to PO Imdur -Blood pressures remain elevated  -It is possible medication nonadherence + cocaine may have precipitated hypertensive urgency  -Increased amlodipine to 10 mg daily and continued home dose clonidine 0.2 mg BID, continued Lasix 40 mg daily and lisinopril 20 mg twice a day.   Cocaine abuse  -Patient counseled on cocaine abuse and its negative effects on his heart  -admitted to smoking crack 1 week ago  -He stated to previous MD "I'm not going to do that anymore."  Recent gastric antrum AVM with bleeding /anemia -current Hgb stable >8.5  DVT prophylaxis: Lovenox Code Status: Full Family Communication: Patient Disposition Plan/Expected LOS: Transfer to floor  Consultants: Cardiology  Procedures: None  Antibiotics: None  Objective: Blood pressure 123/56, pulse 59, temperature 97.3 F (36.3 C), temperature source Oral, resp. rate 18, height 5\' 5"  (1.651 m), weight 166 lb 12.8 oz (75.66 kg), SpO2 98.00%.  Intake/Output Summary (Last 24 hours) at 08/13/13 1224 Last data filed at 08/13/13 1100  Gross per 24 hour  Intake   1150 ml  Output   1850 ml  Net   -700 ml   Exam: General: No acute respiratory distress Lungs: Clear to auscultation bilaterally without wheezes or crackles, RA 100% Cardiovascular: Regular rate and rhythm without murmur gallop or rub normal S1 and S2, no peripheral edema or JVD Abdomen: Nontender, nondistended, soft, bowel sounds positive, no rebound, no ascites, no appreciable mass Musculoskeletal: No significant cyanosis, clubbing of bilateral lower extremities   Scheduled Meds:  Scheduled Meds: .  amLODipine  10 mg Oral Daily  . aspirin EC  81 mg Oral Daily  . atorvastatin  20 mg Oral q1800  . carvedilol  3.125 mg Oral BID  WC  . Chlorhexidine Gluconate Cloth  6 each Topical Q0600  . cloNIDine  0.2 mg Oral BID  . divalproex  500 mg Oral BID  . enoxaparin (LOVENOX) injection  40 mg Subcutaneous Q24H  . ferrous sulfate  325 mg Oral BID WC  . furosemide  80 mg Intravenous BID  . gabapentin  100 mg Oral BID  . hydrALAZINE  100 mg Oral TID  . isosorbide mononitrate  60 mg Oral Daily  . lisinopril  20 mg Oral BID  . mupirocin ointment  1 application Nasal BID  . pantoprazole  40 mg Oral BID  . phenytoin  200 mg Oral q morning - 10a  . phenytoin  300 mg Oral QHS  . spironolactone  50 mg Oral Daily  . tiZANidine  2 mg Oral Daily   Data Reviewed: Basic Metabolic Panel:  Recent Labs Lab 08/11/13 0112 08/13/13 0840  NA 139 135*  K 4.2 4.4  CL 106 95*  CO2 22 27  GLUCOSE 85 92  BUN 18 21  CREATININE 0.99 1.33  CALCIUM 8.5 9.0   Liver Function Tests:  Recent Labs Lab 08/11/13 0112  AST 57*  ALT 27  ALKPHOS 112  BILITOT 0.3  PROT 7.2  ALBUMIN 3.0*   CBC:  Recent Labs Lab 08/11/13 0112  WBC 4.7  NEUTROABS 3.1  HGB 8.5*  HCT 26.3*  MCV 84.6  PLT 250   Cardiac Enzymes:  Recent Labs Lab 08/11/13 0656 08/11/13 0827 08/11/13 1128  TROPONINI <0.30 <0.30 <0.30   BNP (last 3 results)  Recent Labs  07/19/13 0430 08/02/13 0808 08/11/13 0112  PROBNP 981.3* 1930.0* 2998.0*   CBG:  Recent Labs Lab 08/11/13 0631  GLUCAP 123*    Recent Results (from the past 240 hour(s))  MRSA PCR SCREENING     Status: Abnormal   Collection Time    08/11/13  9:41 AM      Result Value Ref Range Status   MRSA by PCR POSITIVE (*) NEGATIVE Final   Comment:            The GeneXpert MRSA Assay (FDA     approved for NASAL specimens     only), is one component of a     comprehensive MRSA colonization     surveillance program. It is not     intended to diagnose MRSA     infection nor to guide or     monitor treatment for     MRSA infections.     RESULT CALLED TO, READ BACK BY AND VERIFIED  WITH:     CARAHER RN 11:30 08/11/13 ([email or web address])    Studies:  Recent x-ray studies have been reviewed in detail by the Attending Physician   Erin Hearing, [place] [facility] Office  [phone] Pager [phone]   **If unable to reach the above provider after paging please contact the [place] @ [phone]  On-Call/Text Page:      [email or web address]      password [email or web address]  If 7PM-7AM, please contact night-coverage [email or web address] Password [facility] 08/13/2013, 12:24 PM   LOS: 2 days   I have personally examined this patient and reviewed the entire database. I have reviewed the above note, made any necessary editorial changes, and agree with its content.  Cherene Altes, MD [facility]

## 2013-08-14 ENCOUNTER — Inpatient Hospital Stay (HOSPITAL_COMMUNITY): Payer: Medicare Other

## 2013-08-14 ENCOUNTER — Inpatient Hospital Stay (HOSPITAL_COMMUNITY): Admission: RE | Admit: 2013-08-14 | Payer: Medicare Other | Source: Ambulatory Visit

## 2013-08-14 DIAGNOSIS — I509 Heart failure, unspecified: Secondary | ICD-10-CM

## 2013-08-14 DIAGNOSIS — I5023 Acute on chronic systolic (congestive) heart failure: Secondary | ICD-10-CM

## 2013-08-14 DIAGNOSIS — I2789 Other specified pulmonary heart diseases: Secondary | ICD-10-CM

## 2013-08-14 LAB — BASIC METABOLIC PANEL
BUN: 28 mg/dL — ABNORMAL HIGH (ref 6–23)
CALCIUM: 8.9 mg/dL (ref 8.4–10.5)
CHLORIDE: 98 meq/L (ref 96–112)
CO2: 27 mEq/L (ref 19–32)
CREATININE: 1.37 mg/dL — AB (ref 0.50–1.35)
GFR calc non Af Amer: 51 mL/min — ABNORMAL LOW (ref >90)
GFR, EST AFRICAN AMERICAN: 59 mL/min — AB (ref >90)
Glucose, Bld: 90 mg/dL (ref 70–99)
Potassium: 4.8 mEq/L (ref 3.7–5.3)
Sodium: 136 mEq/L — ABNORMAL LOW (ref 137–147)

## 2013-08-14 NOTE — Progress Notes (Signed)
[place] TEAM 1 - Stepdown/ICU TEAM Progress Note  Charles [name] AST:[id number] DOB: 09-26-1945 DOA: 08/11/2013 PCP: [name]  Admit HPI / Brief Narrative: 68 yo BM PMHx CAD, S/P three-vessel coronary artery bypass grafting in 2009, ischemic cardiomyopathy, chronic systolic and diastolic congestive heart failure, Hx cocaine abuse (testing positive for cocaine in May and April of 2015 on recent hospitalizations) admitted early this morning presenting with chest pain. Patient found to be hypertensive was started on a nitroglycerin drip   HPI/Subjective: 5/28 states continues to have occasional CP. Sitting comfortably on edge of bed eating lunch    Assessment/Plan: Chest pain  -typical and atypical features, describing retrosternal chest pain radiating to his left arm and jaw, improved with nitrates.  -Troponins have been negative, EKG did not reveal ischemic changes and CARDIOLOGY does not PLAN cardiac cath in absence of objective evidence of ischemia given h/o cocaine abuse- also Myoview in December showed NO ischemia-plan is to continue med rxn including PO nitrate which has been added this admission  -continue aspirin, ACE inhibitor,and statin therapy  -cautious use of coreg in setting of ongoing cocaine use (though drug screen not done this admission)  -Cardiology following  -needs to FU with cardiolgist in [place]-says does not have transportation to MD appts in [place]  -now pt asking for SW assistance in finding housing so can move to [place]   History of chronic systolic and diastolic congestive heart failure  -He appears compensated although big discrepancy in weights as entered in EPIC  -TTE on 07/16/2013 that showed an ejection fraction of 50-55%, mild hypokinesis of the basal-mid inferior lateral and inferior myocardium, grade 2 diastolic dysfunction  -Continue Lasix IV 80 mg BID and Aldactone 50 mg by mouth daily  - Continue Lasix and Aldactone  -slow response  to diuresis thus far-weight was up 10 lbs at admission  -dietary indiscretion major factor: eats processed foods such as baloney and burritos at home  -Per cardiology dry weight of 162 lbs; 5/28 standing weight= 172.7 pounds; BUN/Cr appears to be starting to trend up patient may be at his new dry weight. If BUN/Cr continue to increase will set patient's dry weight at 172.7 pounds  -Discharge in 24-48 hours after transitioning to PO Lasix   Hypertensive emergency  -Transitioned from nitroglycerin drip to PO Imdur 60 mg daily  -Blood pressures remain elevated prior to a.m. medication  -It is possible cocaine abuse/medication nonadherence may have precipitated hypertensive urgency  -Increased amlodipine to 10 mg daily and continued home dose clonidine 0.2 mg BID, continued Lasix IV 80mg  daily and lisinopril 20 mg BID.   Cocaine abuse  -Patient counseled on cocaine abuse and its negative effects on his heart  -admitted to smoking crack 1 week ago  -He stated to previous MD "I'm not going to do that anymore".   Recent gastric antrum AVM with bleeding /anemia  -current Hgb stable >8.5    Code Status: Full  Family Communication:none Disposition Plan/Expected LOS:24-48 hrs     Consultants: Dr. Peter Martinique (Cardiology)      Procedure/Significant Events: 4/29 echocardiogram  -LVEF= 50% to 55%.  -Possible mild hypokinesis basal-midinferolateral and inferior myocardium. - (grade 2 diastolic dysfunction). - Left atrium: mildly dilated. - Right ventricle: mildly dilated. Systolic function was mildly reduced. - Pulmonary arteries: PA peak pressure: 52mm Hg (S).    Culture NA  Antibiotics: NA  DVT prophylaxis: Lovenox   Devices NA  LINES / TUBES:  5/25 20 Ga right anterior forearm.  Continuous Infusions:   Objective: VITAL SIGNS: Temp: 97.6 F (36.4 C) (05/28 1448) Temp src: Oral (05/28 1448) BP: 122/70 mmHg (05/28 1448) Pulse Rate: 70 (05/28 1448) SPO2; 97%  on room air FIO2:   Intake/Output Summary (Last 24 hours) at 08/14/13 1552 Last data filed at 08/14/13 1448  Gross per 24 hour  Intake   1260 ml  Output   2751 ml  Net  -1491 ml     Exam: General:A/O x 4, NAD, No acute respiratory distress  Lungs: Clear to auscultation bilaterally without wheezes or crackles, RA 100%  Cardiovascular: Regular rate and rhythm without murmur gallop or rub normal S1 and S2, no peripheral edema or JVD  Abdomen: Nontender, nondistended, soft, bowel sounds positive, no rebound, no ascites, no appreciable mass  Musculoskeletal: No significant cyanosis, clubbing of bilateral lower extremities   Data Reviewed: Basic Metabolic Panel:  Recent Labs Lab 08/11/13 0112 08/13/13 0840 08/14/13 0458  NA 139 135* 136*  K 4.2 4.4 4.8  CL 106 95* 98  CO2 22 27 27   GLUCOSE 85 92 90  BUN 18 21 28*  CREATININE 0.99 1.33 1.37*  CALCIUM 8.5 9.0 8.9   Liver Function Tests:  Recent Labs Lab 08/11/13 0112  AST 57*  ALT 27  ALKPHOS 112  BILITOT 0.3  PROT 7.2  ALBUMIN 3.0*   No results found for this basename: LIPASE, AMYLASE,  in the last 168 hours No results found for this basename: AMMONIA,  in the last 168 hours CBC:  Recent Labs Lab 08/11/13 0112  WBC 4.7  NEUTROABS 3.1  HGB 8.5*  HCT 26.3*  MCV 84.6  PLT 250   Cardiac Enzymes:  Recent Labs Lab 08/11/13 0656 08/11/13 0827 08/11/13 1128  TROPONINI <0.30 <0.30 <0.30   BNP (last 3 results)  Recent Labs  07/19/13 0430 08/02/13 0808 08/11/13 0112  PROBNP 981.3* 1930.0* 2998.0*   CBG:  Recent Labs Lab 08/11/13 0631  GLUCAP 123*    Recent Results (from the past 240 hour(s))  MRSA PCR SCREENING     Status: Abnormal   Collection Time    08/11/13  9:41 AM      Result Value Ref Range Status   MRSA by PCR POSITIVE (*) NEGATIVE Final   Comment:            The GeneXpert MRSA Assay (FDA     approved for NASAL specimens     only), is one component of a     comprehensive MRSA  colonization     surveillance program. It is not     intended to diagnose MRSA     infection nor to guide or     monitor treatment for     MRSA infections.     RESULT CALLED TO, READ BACK BY AND VERIFIED WITH:     CARAHER RN 11:30 08/11/13 ([email or web address])     Studies:  Recent x-ray studies have been reviewed in detail by the Attending Physician  Scheduled Meds:  Scheduled Meds: . amLODipine  10 mg Oral Daily  . aspirin EC  81 mg Oral Daily  . atorvastatin  20 mg Oral q1800  . carvedilol  3.125 mg Oral BID WC  . Chlorhexidine Gluconate Cloth  6 each Topical Q0600  . divalproex  500 mg Oral BID  . enoxaparin (LOVENOX) injection  40 mg Subcutaneous Q24H  . ferrous sulfate  325 mg Oral BID WC  . furosemide  80 mg Intravenous BID  . gabapentin  100  mg Oral BID  . hydrALAZINE  100 mg Oral TID  . isosorbide mononitrate  60 mg Oral Daily  . lisinopril  20 mg Oral BID  . mupirocin ointment  1 application Nasal BID  . pantoprazole  40 mg Oral BID  . phenytoin  200 mg Oral q morning - 10a  . phenytoin  300 mg Oral QHS  . spironolactone  50 mg Oral Daily  . tiZANidine  2 mg Oral Daily    Time spent on care of this patient: 40 mins   Allie Bossier , MD   [facility]  [phone] Pager [phone]  On-Call/Text Page:      [email or web address]      password [email or web address]  If 7PM-7AM, please contact night-coverage [email or web address] Password [email or web address] 08/14/2013, 3:52 PM   LOS: 3 days

## 2013-08-14 NOTE — Progress Notes (Signed)
Patient Name: Charles [name] Date of Encounter: 08/14/2013     Principal Problem:   Chest pain Active Problems:   Hypertension   Chronic systolic heart failure   CAD (coronary artery disease)   Ischemic cardiomyopathy   Chronic kidney disease (CKD), stage III (moderate)   Moderate to severe pulmonary hypertension   Seizure disorder    SUBJECTIVE  Patient denies any chest discomfort.  He is concerned because his weight is still up 10 pounds from what he considers his dry weight.  Weight is up 1 pound from yesterday although scales are different.  CURRENT MEDS . amLODipine  10 mg Oral Daily  . aspirin EC  81 mg Oral Daily  . atorvastatin  20 mg Oral q1800  . carvedilol  3.125 mg Oral BID WC  . Chlorhexidine Gluconate Cloth  6 each Topical Q0600  . divalproex  500 mg Oral BID  . enoxaparin (LOVENOX) injection  40 mg Subcutaneous Q24H  . ferrous sulfate  325 mg Oral BID WC  . furosemide  80 mg Intravenous BID  . gabapentin  100 mg Oral BID  . hydrALAZINE  100 mg Oral TID  . isosorbide mononitrate  60 mg Oral Daily  . lisinopril  20 mg Oral BID  . mupirocin ointment  1 application Nasal BID  . pantoprazole  40 mg Oral BID  . phenytoin  200 mg Oral q morning - 10a  . phenytoin  300 mg Oral QHS  . spironolactone  50 mg Oral Daily  . tiZANidine  2 mg Oral Daily    OBJECTIVE  Filed Vitals:   08/14/13 0608 08/14/13 0614 08/14/13 0648 08/14/13 0818  BP: 193/98  181/86 166/78  Pulse: 72  66 76  Temp: 97.4 F (36.3 C)     TempSrc: Oral     Resp: 20     Height:      Weight:  172 lb 12.8 oz (78.382 kg)    SpO2: 97%       Intake/Output Summary (Last 24 hours) at 08/14/13 0820 Last data filed at 08/14/13 3329  Gross per 24 hour  Intake   1330 ml  Output   1976 ml  Net   -646 ml   Filed Weights   08/13/13 0852 08/13/13 1510 08/14/13 0614  Weight: 166 lb 12.8 oz (75.66 kg) 171 lb 1.6 oz (77.61 kg) 172 lb 12.8 oz (78.382 kg)    PHYSICAL EXAM  General:  Pleasant, NAD. Neuro: Alert and oriented X 3. Moves all extremities spontaneously. Psych: Normal affect. HEENT:  Normal  Neck: Supple without bruits or JVD. Lungs:  Resp regular and unlabored, CTA. Heart: RRR no s3, s4, or murmurs. Abdomen: Soft, non-tender, non-distended, BS + x 4.  Abdomen mildly distended. Extremities: No clubbing, cyanosis or edema. DP/PT/Radials 2+ and equal bilaterally.  Accessory Clinical Findings  CBC No results found for this basename: WBC, NEUTROABS, HGB, HCT, MCV, PLT,  in the last 72 hours Basic Metabolic Panel  Recent Labs  08/13/13 0840 08/14/13 0458  NA 135* 136*  K 4.4 4.8  CL 95* 98  CO2 27 27  GLUCOSE 92 90  BUN 21 28*  CREATININE 1.33 1.37*  CALCIUM 9.0 8.9   Liver Function Tests No results found for this basename: AST, ALT, ALKPHOS, BILITOT, PROT, ALBUMIN,  in the last 72 hours No results found for this basename: LIPASE, AMYLASE,  in the last 72 hours Cardiac Enzymes  Recent Labs  08/11/13 0827 08/11/13 1128  TROPONINI <0.30 <0.30   BNP No components found with this basename: POCBNP,  D-Dimer No results found for this basename: DDIMER,  in the last 72 hours Hemoglobin A1C No results found for this basename: HGBA1C,  in the last 72 hours Fasting Lipid Panel No results found for this basename: CHOL, HDL, LDLCALC, TRIG, CHOLHDL, LDLDIRECT,  in the last 72 hours Thyroid Function Tests No results found for this basename: TSH, T4TOTAL, FREET3, T3FREE, THYROIDAB,  in the last 72 hours  TELE  Normal sinus rhythm with occasional PVC  ECG    Radiology/Studies  Dg Chest 2 View  08/11/2013   CLINICAL DATA:  Shortness of breath.  EXAM: CHEST  2 VIEW  COMPARISON:  Chest radiograph performed 08/02/2013  FINDINGS: The lungs are well-aerated. Vascular congestion is noted. Increased interstitial markings may reflect mild interstitial edema or possibly pneumonia. No pleural effusion or pneumothorax is seen.  The heart is enlarged. The  patient is status post median sternotomy, with evidence of prior CABG. No acute osseous abnormalities are seen.  IMPRESSION: Vascular congestion and cardiomegaly. Increased interstitial markings may reflect mild interstitial edema or possibly pneumonia.   Electronically Signed   By: Garald Balding M.D.   On: 08/11/2013 01:57   Dg Chest 2 View  07/15/2013   CLINICAL DATA:  Shortness of breath and lower extremity edema at  EXAM: CHEST  2 VIEW  COMPARISON:  DG CHEST 1V PORT dated 03/21/2013  FINDINGS: The cardiopericardial silhouette remains enlarged. The pulmonary vascularity is more engorged and less distinct today than on the previous study. The pulmonary interstitial markings also are mildly increased especially on the right. There is no significant pleural effusion. The patient has undergone previous CABG.  IMPRESSION: The findings are consistent with CHF with mild pulmonary interstitial edema.   Electronically Signed   By: David  Martinique   On: 07/15/2013 23:27   Ct Angio Ao+bifem W/cm &/or Wo/cm  07/16/2013   CLINICAL DATA:  Left leg pain and swelling.  EXAM: CT ANGIOGRAPHY OF ABDOMINAL AORTA WITH ILIOFEMORAL RUNOFF  TECHNIQUE: Multidetector CT imaging of the abdomen, pelvis and lower extremities was performed using the standard protocol during bolus administration of intravenous contrast. Multiplanar CT image reconstructions and MIPs were obtained to evaluate the vascular anatomy.  CONTRAST:  138mL OMNIPAQUE IOHEXOL 350 MG/ML SOLN  COMPARISON:  CT of the abdomen and pelvis from 03/24/2010, and abdominal ultrasound performed 04/18/2010  FINDINGS: Aorta: Relatively diffuse calcification is seen along the abdominal aorta and its branches. The celiac trunk, superior mesenteric artery, bilateral renal arteries and inferior mesenteric artery remain patent, with mild calcific atherosclerotic disease seen along the superior mesenteric artery and bilateral renal arteries. There is likely moderate luminal narrowing at  the proximal right common iliac artery, and severe luminal narrowing at the proximal left common iliac artery.  Calcific atherosclerotic disease is somewhat worse at the left common iliac artery, with additional moderate to severe luminal narrowing along its course. There is also moderate to severe luminal narrowing at the bifurcation of the left common iliac artery. Flow is still seen to the level of the common femoral arteries bilaterally.  Right Lower Extremity: Scattered calcific atherosclerotic disease is noted along the visualized vasculature. There is likely mild to moderate luminal narrowing involving the proximal profunda femoris. The right superficial femoral artery appears to occlude at the level of the mid thigh, though it reconstitutes more distally, just proximal to the level of the knee. This may reflect reconstitution of flow from collateral  vessels, or trace nonvisualized flow along the apparently occluded segment.  Calcific atherosclerotic disease is noted along the distal popliteal artery. There is apparent three vessel runoff to the right ankle, though the posterior tibial artery is less well characterized just proximal to the level of the ankle.  Left Lower Extremity: Scattered calcific atherosclerotic disease is noted along the visualized vasculature. There is likely moderate luminal narrowing involving the proximal profunda femoris. There is apparent occlusion of the left superficial femoral artery at the level of the mid thigh, as on the right, with reconstitution noted just proximal to the level of the knee. As on the right, this may reflect reconstitution of flow from collateral vessels, with trace nonvisualized flow along the apparently occluded segment.  There is mild scattered calcific atherosclerotic disease along the proximal branches of the left popliteal artery. Patent three vessel runoff is seen to the level of the ankle.  Additional Findings:  A trace left pleural effusion is  noted.  Trace ascites is noted surrounding the liver, tracking along the right paracolic gutter.  The liver and spleen are grossly unremarkable in appearance. The gallbladder is within normal limits. The pancreas and adrenal glands are unremarkable.  Nonspecific perinephric stranding is noted bilaterally. The kidneys are otherwise unremarkable in appearance. There is no evidence of hydronephrosis. No renal or ureteral stones are seen.  No free fluid is identified. The small bowel is unremarkable in appearance. The stomach is within normal limits. No acute vascular abnormalities are seen.  The appendix is normal in caliber and contains air, without evidence for appendicitis. The colon is unremarkable in appearance.  The bladder is mildly distended. Mild bladder wall thickening is nonspecific and may be chronic in nature. The prostate remains normal in size. No inguinal lymphadenopathy is seen.  Note is made of diffuse bilateral lower extremity edema, slightly worse on the left. This is relatively superficial in nature. No underlying focal fluid collection is seen. Edema resolves at the level of the ankles, though trace edema is noted at both feet.  No acute osseous abnormalities are identified. Multilevel vacuum phenomenon is noted at the lower lumbar spine.  Review of the MIP images confirms the above findings.  IMPRESSION: 1. Vascular findings as described above. 2. The superficial femoral arteries appear to exclude bilaterally at the level of the mid thighs, though they reconstitute more distally, just proximal to the level of the knees. This may reflect reconstitution of flow from collateral vessels, or trace nonvisualized flow along the apparently occluded segments. 3. Calcific atherosclerotic disease is somewhat worsened at the left common iliac artery, with moderate luminal narrowing at the proximal right common iliac artery, and severe luminal narrowing at multiple locations along the left common iliac  artery. 4. Following reconstitution of the popliteal arteries, there appears to be patent three vessel runoff to both lower extremities, though the right posterior tibial artery is difficult to fully characterize just proximal to the level of the ankle. 5. Diffuse bilateral lower extremity edema, slightly worse on the left. This is relatively superficial in nature. No underlying focal fluid collection seen. Edema resolves at the level of the ankles, though trace edema is seen at both feet. 6. Trace ascites noted surrounding the liver, tracking along the right paracolic gutter. 7. Trace left pleural effusion noted. 8. Mild bladder wall thickening is nonspecific and may be chronic in nature.   Electronically Signed   By: Garald Balding M.D.   On: 07/16/2013 02:46   Nm Pulmonary Perf  And Vent  07/17/2013   CLINICAL DATA:  Right leg pain and swelling. Question pulmonary embolus.  EXAM: NUCLEAR MEDICINE VENTILATION - PERFUSION LUNG SCAN  TECHNIQUE: Ventilation images were obtained in multiple projections using inhaled aerosol technetium 99 M DTPA. Perfusion images were obtained in multiple projections after intravenous injection of Tc-46m MAA.  RADIOPHARMACEUTICALS:  40 mCi Tc-2m DTPA aerosol and 6 mCi Tc-85m MAA  COMPARISON:  PA and lateral chest 07/15/2013.  CT scan 04/08/2010.  FINDINGS: Ventilation: No focal ventilation defect.  Perfusion: No wedge shaped peripheral perfusion defects to suggest acute pulmonary embolism.  IMPRESSION: Negative exam.   Electronically Signed   By: Inge Rise M.D.   On: 07/17/2013 12:32   Dg Chest Portable 1 View  08/02/2013   CLINICAL DATA:  Chest pain  EXAM: PORTABLE CHEST - 1 VIEW  COMPARISON:  07/15/2013  FINDINGS: Stable moderate cardiomegaly. Status post CABG. Mild pulmonary hyperinflation with chronic interstitial coarsening. No edema or consolidation. No effusion or pneumothorax.  IMPRESSION: Cardiomegaly and pulmonary venous congestion.   Electronically Signed   By:  Jorje Guild M.D.   On: 08/02/2013 08:33    ASSESSMENT AND PLAN 1. Acute on chronic diastolic CHF. Weight is up 10 lbs since last admission in April. Responded better to IV lasix since dose increased yesterday. No weight recorded today. I/O negative 977. Continue intravenous lasix  80 mg bid. Continue other therapy with Nitrates, hydralazine, aldactone and low dose coreg. When he is close to dry weight of 162 lbs can switch Lasix to po but will require a higher dose than before to maintain weight. Clearly needs to do better with diet since he eats a lot of processed meats.  2. Atypical chest pain. Patient has ruled out for infarct. Pain may be aggravated by fluid overload. I would not be anxious to perform cardiac cath in the absence of objective evidence of ischemia given history of cocaine abuse. Myoview in December showed no ischemia. Continue medical management. Agree with transitioning nitrates to po.  3. HTN. On multiple medications at maximal doses. Compliance with medications and diet stressed. May improve with further diuresis.  4. Chronic anemia  5. History of cocaine abuse.   Plan: We'll get followup chest x-ray today  Signed, Darlin Coco MD

## 2013-08-15 LAB — BASIC METABOLIC PANEL
BUN: 37 mg/dL — ABNORMAL HIGH (ref 6–23)
CALCIUM: 9.4 mg/dL (ref 8.4–10.5)
CO2: 27 mEq/L (ref 19–32)
CREATININE: 1.46 mg/dL — AB (ref 0.50–1.35)
Chloride: 94 mEq/L — ABNORMAL LOW (ref 96–112)
GFR calc Af Amer: 55 mL/min — ABNORMAL LOW (ref >90)
GFR calc non Af Amer: 47 mL/min — ABNORMAL LOW (ref >90)
Glucose, Bld: 123 mg/dL — ABNORMAL HIGH (ref 70–99)
Potassium: 4.8 mEq/L (ref 3.7–5.3)
Sodium: 135 mEq/L — ABNORMAL LOW (ref 137–147)

## 2013-08-15 MED ORDER — ISOSORB DINITRATE-HYDRALAZINE 20-37.5 MG PO TABS
2.0000 | ORAL_TABLET | Freq: Two times a day (BID) | ORAL | Status: DC
  Administered 2013-08-15 – 2013-08-19 (×9): 2 via ORAL
  Filled 2013-08-15 (×10): qty 2

## 2013-08-15 NOTE — Progress Notes (Signed)
Patient Name: Charles [name] Date of Encounter: 08/15/2013     Principal Problem:   Chest pain Active Problems:   Hypertension   Chronic systolic heart failure   CAD (coronary artery disease)   Ischemic cardiomyopathy   Chronic kidney disease (CKD), stage III (moderate)   Moderate to severe pulmonary hypertension   Seizure disorder    SUBJECTIVE  No chest pain or severe dyspnea. Diuresed better yesterday. Weight down 5 lb.  CURRENT MEDS . amLODipine  10 mg Oral Daily  . aspirin EC  81 mg Oral Daily  . atorvastatin  20 mg Oral q1800  . carvedilol  3.125 mg Oral BID WC  . Chlorhexidine Gluconate Cloth  6 each Topical Q0600  . divalproex  500 mg Oral BID  . enoxaparin (LOVENOX) injection  40 mg Subcutaneous Q24H  . ferrous sulfate  325 mg Oral BID WC  . furosemide  80 mg Intravenous BID  . gabapentin  100 mg Oral BID  . hydrALAZINE  100 mg Oral TID  . isosorbide mononitrate  60 mg Oral Daily  . lisinopril  20 mg Oral BID  . mupirocin ointment  1 application Nasal BID  . pantoprazole  40 mg Oral BID  . phenytoin  200 mg Oral q morning - 10a  . phenytoin  300 mg Oral QHS  . spironolactone  50 mg Oral Daily  . tiZANidine  2 mg Oral Daily    OBJECTIVE  Filed Vitals:   08/14/13 0818 08/14/13 1448 08/14/13 2147 08/15/13 0537  BP: 166/78 122/70 144/67 163/85  Pulse: 76 70 69 78  Temp:  97.6 F (36.4 C) 97.9 F (36.6 C) 97.6 F (36.4 C)  TempSrc:  Oral Oral Oral  Resp:  20    Height:      Weight:    167 lb 1.7 oz (75.8 kg)  SpO2:  97% 98% 97%    Intake/Output Summary (Last 24 hours) at 08/15/13 0806 Last data filed at 08/15/13 0540  Gross per 24 hour  Intake   1680 ml  Output   2175 ml  Net   -495 ml   Filed Weights   08/13/13 1510 08/14/13 0614 08/15/13 0537  Weight: 171 lb 1.6 oz (77.61 kg) 172 lb 12.8 oz (78.382 kg) 167 lb 1.7 oz (75.8 kg)    PHYSICAL EXAM  General: Pleasant, NAD. Neuro: Alert and oriented X 3. Moves all extremities  spontaneously. Psych: Normal affect. HEENT:  Normal  Neck: Supple without bruits or JVD. Lungs:  Resp regular and unlabored, CTA. Heart: RRR no s3, s4, or murmurs. Abdomen: Soft, non-tender, mild distention. Extremities: No clubbing, cyanosis or edema. DP/PT/Radials 2+ and equal bilaterally.  Accessory Clinical Findings  CBC No results found for this basename: WBC, NEUTROABS, HGB, HCT, MCV, PLT,  in the last 72 hours Basic Metabolic Panel  Recent Labs  08/14/13 0458 08/15/13 0610  NA 136* 135*  K 4.8 4.8  CL 98 94*  CO2 27 27  GLUCOSE 90 123*  BUN 28* 37*  CREATININE 1.37* 1.46*  CALCIUM 8.9 9.4   Liver Function Tests No results found for this basename: AST, ALT, ALKPHOS, BILITOT, PROT, ALBUMIN,  in the last 72 hours No results found for this basename: LIPASE, AMYLASE,  in the last 72 hours Cardiac Enzymes No results found for this basename: CKTOTAL, CKMB, CKMBINDEX, TROPONINI,  in the last 72 hours BNP No components found with this basename: POCBNP,  D-Dimer No results found for this basename: DDIMER,  in the last 72 hours Hemoglobin A1C No results found for this basename: HGBA1C,  in the last 72 hours Fasting Lipid Panel No results found for this basename: CHOL, HDL, LDLCALC, TRIG, CHOLHDL, LDLDIRECT,  in the last 72 hours Thyroid Function Tests No results found for this basename: TSH, T4TOTAL, FREET3, T3FREE, THYROIDAB,  in the last 72 hours  TELE  NSR   ECG    Radiology/Studies  Dg Chest 2 View  08/14/2013   CLINICAL DATA:  Shortness of Breath  EXAM: CHEST  2 VIEW  COMPARISON:  Aug 11, 2013  FINDINGS: There is no edema or consolidation. Heart is borderline enlarged with pulmonary vascularity consistent with mild pulmonary venous hypertension. The patient is status post coronary artery bypass grafting. No adenopathy. No bone lesions.  IMPRESSION: Evidence of a degree of volume overload without edema or consolidation. No new opacity.   Electronically Signed    By: Lowella Grip M.D.   On: 08/14/2013 11:04   Dg Chest 2 View  08/11/2013   CLINICAL DATA:  Shortness of breath.  EXAM: CHEST  2 VIEW  COMPARISON:  Chest radiograph performed 08/02/2013  FINDINGS: The lungs are well-aerated. Vascular congestion is noted. Increased interstitial markings may reflect mild interstitial edema or possibly pneumonia. No pleural effusion or pneumothorax is seen.  The heart is enlarged. The patient is status post median sternotomy, with evidence of prior CABG. No acute osseous abnormalities are seen.  IMPRESSION: Vascular congestion and cardiomegaly. Increased interstitial markings may reflect mild interstitial edema or possibly pneumonia.   Electronically Signed   By: Garald Balding M.D.   On: 08/11/2013 01:57   Nm Pulmonary Perf And Vent  07/17/2013   CLINICAL DATA:  Right leg pain and swelling. Question pulmonary embolus.  EXAM: NUCLEAR MEDICINE VENTILATION - PERFUSION LUNG SCAN  TECHNIQUE: Ventilation images were obtained in multiple projections using inhaled aerosol technetium 99 M DTPA. Perfusion images were obtained in multiple projections after intravenous injection of Tc-65m MAA.  RADIOPHARMACEUTICALS:  40 mCi Tc-48m DTPA aerosol and 6 mCi Tc-89m MAA  COMPARISON:  PA and lateral chest 07/15/2013.  CT scan 04/08/2010.  FINDINGS: Ventilation: No focal ventilation defect.  Perfusion: No wedge shaped peripheral perfusion defects to suggest acute pulmonary embolism.  IMPRESSION: Negative exam.   Electronically Signed   By: Inge Rise M.D.   On: 07/17/2013 12:32   Dg Chest Portable 1 View  08/02/2013   CLINICAL DATA:  Chest pain  EXAM: PORTABLE CHEST - 1 VIEW  COMPARISON:  07/15/2013  FINDINGS: Stable moderate cardiomegaly. Status post CABG. Mild pulmonary hyperinflation with chronic interstitial coarsening. No edema or consolidation. No effusion or pneumothorax.  IMPRESSION: Cardiomegaly and pulmonary venous congestion.   Electronically Signed   By: Jorje Guild  M.D.   On: 08/02/2013 08:33    ASSESSMENT AND PLAN 1. Acute on chronic diastolic CHF.  Continue intravenous lasix 80 mg bid. Continue other therapy with Nitrates, hydralazine, aldactone and low dose coreg. When he is close to dry weight of 162 lbs can switch Lasix to po but will require a higher dose than before to maintain weight. Clearly needs to do better with diet since he eats a lot of processed meats. Chest xray reviewed by myself shows slight  improvement in vascular congestion. 2. Atypical chest pain. Patient has ruled out for infarct. Pain may be aggravated by fluid overload. I would not be anxious to perform cardiac cath in the absence of objective evidence of ischemia given history of cocaine  abuse. Myoview in December showed no ischemia. Continue medical management. Agree with transitioning nitrates to po.  3. HTN. On multiple medications at maximal doses. Compliance with medications and diet stressed. May improve with further diuresis.  4. Chronic anemia  5. History of cocaine abuse.   Plan: continue IV lasix today. Consider switch to oral tomorrow if close to his goal weight of 162.  Signed, Darlin Coco MD

## 2013-08-15 NOTE — Progress Notes (Signed)
[facility] PROGRESS NOTE Interim History: 68 yo BM PMHx CAD, S/P three-vessel coronary artery bypass grafting in 2009, ischemic cardiomyopathy, chronic systolic and diastolic congestive heart failure, Hx cocaine abuse (testing positive for cocaine in May and April of 2015 on recent hospitalizations) admitted early this morning presenting with chest pain. Patient found to be hypertensive was started on a nitroglycerin drip, now diuresing.  Filed Weights   08/13/13 1510 08/14/13 0614 08/15/13 0537  Weight: 77.61 kg (171 lb 1.6 oz) 78.382 kg (172 lb 12.8 oz) 75.8 kg (167 lb 1.7 oz)        Intake/Output Summary (Last 24 hours) at 08/15/13 2297 Last data filed at 08/15/13 0540  Gross per 24 hour  Intake   1680 ml  Output   2175 ml  Net   -495 ml     Assessment/Plan: Chest pain  - Typical and atypical features,EKG did not reveal ischemic changes. - Consulted cardiology recommended no further intervention, Myoview in December showed NO ischemia- - Continue aspirin, ACE inhibitor,and statin therapy   Acute chronic systolic and diastolic congestive heart failure  - Per cardiology dry weight of 162 -> 167lbs;  - Cont IV lasix, monitor electrolytes replete as needed. - daily weight, b-met daily. Will change hydralazine and imdur to bidil. Insurance will cover.  Hypertensive emergency  -Transitioned from nitroglycerin drip to PO Imdur 60 mg daily  -Blood pressures remain elevated will change to BiDil.  - Cont amlodipine to 10 mg daily and continued home dose clonidine 0.2 mg BID, continued Lasix IV 31m daily and lisinopril 20 mg BID.   Cocaine abuse  - Patient counseled on cocaine abuse and its negative effects on his heart  - Admitted to smoking crack 1 week ago  - He stated to previous MD "I'm not going to do that anymore".   Recent gastric antrum AVM with bleeding /anemia  -current Hgb stable >8.5    Code Status: full Family Communication: none  Disposition Plan:  inpatient   Consultants:  card  Procedures: 4/29 echocardiogram  -LVEF= 50% to 55%.  -Possible mild hypokinesis basal-midinferolateral and inferior myocardium. - (grade 2 diastolic dysfunction). - Left atrium: mildly dilated. - Right ventricle: mildly dilated. Systolic function was mildly reduced. - Pulmonary arteries: PA peak pressure: 558mHg (S).   Antibiotics:  None  HPI/Subjective: No complains  Objective: Filed Vitals:   08/14/13 0818 08/14/13 1448 08/14/13 2147 08/15/13 0537  BP: 166/78 122/70 144/67 163/85  Pulse: 76 70 69 78  Temp:  97.6 F (36.4 C) 97.9 F (36.6 C) 97.6 F (36.4 C)  TempSrc:  Oral Oral Oral  Resp:  20    Height:      Weight:    75.8 kg (167 lb 1.7 oz)  SpO2:  97% 98% 97%     Exam:  General: Alert, awake, oriented x3, in no acute distress.  HEENT: No bruits, no goiter. +JVD Heart: Regular rate and rhythm, without murmurs, rubs, gallops.  Lungs: Good air movement, clear  Abdomen: Soft, nontender, nondistended, positive bowel sounds.     Data Reviewed: Basic Metabolic Panel:  Recent Labs Lab 08/11/13 0112 08/13/13 0840 08/14/13 0458 08/15/13 0610  NA 139 135* 136* 135*  K 4.2 4.4 4.8 4.8  CL 106 95* 98 94*  CO2 _0 GLUCOSE 85 92 90 123*  BUN 18 21 28* 37*  CREATININE 0.99 1.33 1.37* 1.46*  CALCIUM 8.5 9.0 8.9 9.4   Liver Function Tests:  Recent Labs  Lab 08/11/13 0112  AST 57*  ALT 27  ALKPHOS 112  BILITOT 0.3  PROT 7.2  ALBUMIN 3.0*   No results found for this basename: LIPASE, AMYLASE,  in the last 168 hours No results found for this basename: AMMONIA,  in the last 168 hours CBC:  Recent Labs Lab 08/11/13 0112  WBC 4.7  NEUTROABS 3.1  HGB 8.5*  HCT 26.3*  MCV 84.6  PLT 250   Cardiac Enzymes:  Recent Labs Lab 08/11/13 0656 08/11/13 0827 08/11/13 1128  TROPONINI <0.30 <0.30 <0.30   BNP (last 3 results)  Recent Labs  07/19/13 0430 08/02/13 0808 08/11/13 0112  PROBNP 981.3*  1930.0* 2998.0*   CBG:  Recent Labs Lab 08/11/13 0631  GLUCAP 123*    Recent Results (from the past 240 hour(s))  MRSA PCR SCREENING     Status: Abnormal   Collection Time    08/11/13  9:41 AM      Result Value Ref Range Status   MRSA by PCR POSITIVE (*) NEGATIVE Final   Comment:            The GeneXpert MRSA Assay (FDA     approved for NASAL specimens     only), is one component of a     comprehensive MRSA colonization     surveillance program. It is not     intended to diagnose MRSA     infection nor to guide or     monitor treatment for     MRSA infections.     RESULT CALLED TO, READ BACK BY AND VERIFIED WITH:     CARAHER RN 11:30 08/11/13 ([email or web address])     Studies: Dg Chest 2 View  08/14/2013   CLINICAL DATA:  Shortness of Breath  EXAM: CHEST  2 VIEW  COMPARISON:  Aug 11, 2013  FINDINGS: There is no edema or consolidation. Heart is borderline enlarged with pulmonary vascularity consistent with mild pulmonary venous hypertension. The patient is status post coronary artery bypass grafting. No adenopathy. No bone lesions.  IMPRESSION: Evidence of a degree of volume overload without edema or consolidation. No new opacity.   Electronically Signed   By: William  Woodruff M.D.   On: 08/14/2013 11:04    Scheduled Meds: . amLODipine  10 mg Oral Daily  . aspirin EC  81 mg Oral Daily  . atorvastatin  20 mg Oral q1800  . carvedilol  3.125 mg Oral BID WC  . Chlorhexidine Gluconate Cloth  6 each Topical Q0600  . divalproex  500 mg Oral BID  . enoxaparin (LOVENOX) injection  40 mg Subcutaneous Q24H  . ferrous sulfate  325 mg Oral BID WC  . furosemide  80 mg Intravenous BID  . gabapentin  100 mg Oral BID  . hydrALAZINE  100 mg Oral TID  . isosorbide mononitrate  60 mg Oral Daily  . lisinopril  20 mg Oral BID  . mupirocin ointment  1 application Nasal BID  . pantoprazole  40 mg Oral BID  . phenytoin  200 mg Oral q morning - 10a  . phenytoin  300 mg Oral QHS  . spironolactone  50 mg  Oral Daily  . tiZANidine  2 mg Oral Daily   Continuous Infusions:    Abraham Feliz Ortiz  [facility] Pager [phone] If 8PM-8AM, please contact night-coverage at [email or web address], password [email or web address] 08/15/2013, 8:19 AM  LOS: 4 days        

## 2013-08-16 DIAGNOSIS — I5022 Chronic systolic (congestive) heart failure: Secondary | ICD-10-CM

## 2013-08-16 LAB — BASIC METABOLIC PANEL
BUN: 54 mg/dL — ABNORMAL HIGH (ref 6–23)
CALCIUM: 9 mg/dL (ref 8.4–10.5)
CO2: 30 mEq/L (ref 19–32)
Chloride: 95 mEq/L — ABNORMAL LOW (ref 96–112)
Creatinine, Ser: 1.97 mg/dL — ABNORMAL HIGH (ref 0.50–1.35)
GFR, EST AFRICAN AMERICAN: 38 mL/min — AB (ref >90)
GFR, EST NON AFRICAN AMERICAN: 33 mL/min — AB (ref >90)
Glucose, Bld: 60 mg/dL — ABNORMAL LOW (ref 70–99)
POTASSIUM: 5.5 meq/L — AB (ref 3.7–5.3)
SODIUM: 136 meq/L — AB (ref 137–147)

## 2013-08-16 LAB — GLUCOSE, CAPILLARY: Glucose-Capillary: 113 mg/dL — ABNORMAL HIGH (ref 70–99)

## 2013-08-16 MED ORDER — SODIUM POLYSTYRENE SULFONATE 15 GM/60ML PO SUSP
15.0000 g | Freq: Once | ORAL | Status: AC
  Administered 2013-08-16: 15 g via ORAL
  Filled 2013-08-16: qty 60

## 2013-08-16 MED ORDER — FUROSEMIDE 80 MG PO TABS
80.0000 mg | ORAL_TABLET | Freq: Two times a day (BID) | ORAL | Status: DC
  Filled 2013-08-16 (×3): qty 1

## 2013-08-16 MED ORDER — FUROSEMIDE 80 MG PO TABS: 80.0000 mg | ORAL_TABLET | Freq: Every day | ORAL | Status: DC

## 2013-08-16 MED ORDER — LISINOPRIL 20 MG PO TABS
20.0000 mg | ORAL_TABLET | Freq: Two times a day (BID) | ORAL | Status: DC
  Administered 2013-08-17 – 2013-08-19 (×5): 20 mg via ORAL
  Filled 2013-08-16 (×6): qty 1

## 2013-08-16 NOTE — Progress Notes (Signed)
Iv  Came out , prn med spilled

## 2013-08-16 NOTE — Progress Notes (Signed)
[facility] PROGRESS NOTE Interim History: 68 yo BM PMHx CAD, S/P three-vessel coronary artery bypass grafting in 2009, ischemic cardiomyopathy, chronic systolic and diastolic congestive heart failure, Hx cocaine abuse (testing positive for cocaine in May and April of 2015 on recent hospitalizations) admitted early this morning presenting with chest pain. Patient found to be hypertensive was started on a nitroglycerin drip, now diuresing.  Filed Weights   08/14/13 0614 08/15/13 0537 08/16/13 0602  Weight: 78.382 kg (172 lb 12.8 oz) 75.8 kg (167 lb 1.7 oz) 76.159 kg (167 lb 14.4 oz)        Intake/Output Summary (Last 24 hours) at 08/16/13 0830 Last data filed at 08/15/13 2140  Gross per 24 hour  Intake   1320 ml  Output    800 ml  Net    520 ml     Assessment/Plan: Chest pain  - Typical and atypical features,EKG did not reveal ischemic changes. - Consulted cardiology recommended no further intervention, Myoview in December showed NO ischemia- - Continue aspirin, ACE inhibitor,and statin therapy   Acute chronic systolic and diastolic congestive heart failure  - Consulted cardiology, dry weight of 162. - Significant increase in Cr, hold lasix for today, resume orally in am at a higher home dose. - Monitor electrolytes replete as needed. - daily weight, b-met daily. Cont bidil. Insurance will cover.  Hypertensive emergency  -Transitioned from nitroglycerin drip to PO Imdur 60 mg daily  -Blood pressures improved, cont BiDil.  - Cont amlodipine to 10 mg daily and continued home dose clonidine 0.2 mg BID, continued Lasix IV 59m daily and lisinopril 20 mg BID.   Cocaine abuse  - Patient counseled on cocaine abuse and its negative effects on his heart  - Admitted to smoking crack 1 week ago  - He stated to previous MD "I'm not going to do that anymore".   Recent gastric antrum AVM with bleeding /anemia  -current Hgb stable >8.5    Code Status: full Family  Communication: none  Disposition Plan: inpatient   Consultants:  card  Procedures: 4/29 echocardiogram  -LVEF= 50% to 55%.  -Possible mild hypokinesis basal-midinferolateral and inferior myocardium. - (grade 2 diastolic dysfunction). - Left atrium: mildly dilated. - Right ventricle: mildly dilated. Systolic function was mildly reduced. - Pulmonary arteries: PA peak pressure: 56mHg (S).   Antibiotics:  None  HPI/Subjective: No complains  Objective: Filed Vitals:   08/15/13 0907 08/15/13 1311 08/15/13 2120 08/16/13 0602  BP: 136/68 95/54 125/69 117/71  Pulse: 87 70 70 74  Temp:  97.6 F (36.4 C) 97.8 F (36.6 C) 97.5 F (36.4 C)  TempSrc:  Oral Oral Oral  Resp:  _0 Height:      Weight:    76.159 kg (167 lb 14.4 oz)  SpO2: 100% 100% 96% 96%     Exam:  General: Alert, awake, oriented x3, in no acute distress.  HEENT: No bruits, no goiter. -JVD Heart: Regular rate and rhythm, without murmurs, rubs, gallops.  Lungs: Good air movement, clear  Abdomen: Soft, nontender, nondistended, positive bowel sounds.     Data Reviewed: Basic Metabolic Panel:  Recent Labs Lab 08/11/13 0112 08/13/13 0840 08/14/13 0458 08/15/13 0610 08/16/13 0340  NA 139 135* 136* 135* 136*  K 4.2 4.4 4.8 4.8 5.5*  CL 106 95* 98 94* 95*  CO2 _1 GLUCOSE 85 92 90 123* 60*  BUN 18 21 28* 37* 54*  CREATININE 0.99 1.33 1.37* 1.46*  1.97*  CALCIUM 8.5 9.0 8.9 9.4 9.0   Liver Function Tests:  Recent Labs Lab 08/11/13 0112  AST 57*  ALT 27  ALKPHOS 112  BILITOT 0.3  PROT 7.2  ALBUMIN 3.0*   No results found for this basename: LIPASE, AMYLASE,  in the last 168 hours No results found for this basename: AMMONIA,  in the last 168 hours CBC:  Recent Labs Lab 08/11/13 0112  WBC 4.7  NEUTROABS 3.1  HGB 8.5*  HCT 26.3*  MCV 84.6  PLT 250   Cardiac Enzymes:  Recent Labs Lab 08/11/13 0656 08/11/13 0827 08/11/13 1128  TROPONINI <0.30 <0.30 <0.30    BNP (last 3 results)  Recent Labs  07/19/13 0430 08/02/13 0808 08/11/13 0112  PROBNP 981.3* 1930.0* 2998.0*   CBG:  Recent Labs Lab 08/11/13 0631 08/16/13 0609  GLUCAP 123* 113*    Recent Results (from the past 240 hour(s))  MRSA PCR SCREENING     Status: Abnormal   Collection Time    08/11/13  9:41 AM      Result Value Ref Range Status   MRSA by PCR POSITIVE (*) NEGATIVE Final   Comment:            The GeneXpert MRSA Assay (FDA     approved for NASAL specimens     only), is one component of a     comprehensive MRSA colonization     surveillance program. It is not     intended to diagnose MRSA     infection nor to guide or     monitor treatment for     MRSA infections.     RESULT CALLED TO, READ BACK BY AND VERIFIED WITH:     CARAHER RN 11:30 08/11/13 ([email or web address])     Studies: Dg Chest 2 View  08/14/2013   CLINICAL DATA:  Shortness of Breath  EXAM: CHEST  2 VIEW  COMPARISON:  Aug 11, 2013  FINDINGS: There is no edema or consolidation. Heart is borderline enlarged with pulmonary vascularity consistent with mild pulmonary venous hypertension. The patient is status post coronary artery bypass grafting. No adenopathy. No bone lesions.  IMPRESSION: Evidence of a degree of volume overload without edema or consolidation. No new opacity.   Electronically Signed   By: Lowella Grip M.D.   On: 08/14/2013 11:04    Scheduled Meds: . amLODipine  10 mg Oral Daily  . aspirin EC  81 mg Oral Daily  . atorvastatin  20 mg Oral q1800  . carvedilol  3.125 mg Oral BID WC  . divalproex  500 mg Oral BID  . enoxaparin (LOVENOX) injection  40 mg Subcutaneous Q24H  . ferrous sulfate  325 mg Oral BID WC  . [START ON 08/17/2013] furosemide  80 mg Oral Daily  . gabapentin  100 mg Oral BID  . isosorbide-hydrALAZINE  2 tablet Oral BID  . [START ON 08/17/2013] lisinopril  20 mg Oral BID  . mupirocin ointment  1 application Nasal BID  . pantoprazole  40 mg Oral BID  . phenytoin  200 mg Oral  q morning - 10a  . phenytoin  300 mg Oral QHS  . sodium polystyrene  15 g Oral Once  . spironolactone  50 mg Oral Daily  . tiZANidine  2 mg Oral Daily   Continuous Infusions:    Charlynne Cousins  [facility] Pager [phone] If 8PM-8AM, please contact night-coverage at [email or web address], password [email or web address] 08/16/2013, 8:30 AM  LOS: 5 days

## 2013-08-16 NOTE — Progress Notes (Signed)
Patient Name: Charles [name] Date of Encounter: 08/16/2013     Principal Problem:   Chest pain Active Problems:   Hypertension   Chronic systolic heart failure   CAD (coronary artery disease)   Ischemic cardiomyopathy   Chronic kidney disease (CKD), stage III (moderate)   Moderate to severe pulmonary hypertension   Seizure disorder    SUBJECTIVE  No chest pain or dyspnea. Asleep on noon rounds today.  CURRENT MEDS . amLODipine  10 mg Oral Daily  . aspirin EC  81 mg Oral Daily  . atorvastatin  20 mg Oral q1800  . carvedilol  3.125 mg Oral BID WC  . divalproex  500 mg Oral BID  . enoxaparin (LOVENOX) injection  40 mg Subcutaneous Q24H  . ferrous sulfate  325 mg Oral BID WC  . [START ON 08/17/2013] furosemide  80 mg Oral BID  . gabapentin  100 mg Oral BID  . isosorbide-hydrALAZINE  2 tablet Oral BID  . [START ON 08/17/2013] lisinopril  20 mg Oral BID  . pantoprazole  40 mg Oral BID  . phenytoin  200 mg Oral q morning - 10a  . phenytoin  300 mg Oral QHS  . sodium polystyrene  15 g Oral Once  . spironolactone  50 mg Oral Daily  . tiZANidine  2 mg Oral Daily    OBJECTIVE  Filed Vitals:   08/15/13 1311 08/15/13 2120 08/16/13 0602 08/16/13 0926  BP: 95/54 125/69 117/71 127/73  Pulse: 70 70 74 85  Temp: 97.6 F (36.4 C) 97.8 F (36.6 C) 97.5 F (36.4 C)   TempSrc: Oral Oral Oral   Resp: 18 16 18 16   Height:      Weight:   167 lb 14.4 oz (76.159 kg)   SpO2: 100% 96% 96% 98%    Intake/Output Summary (Last 24 hours) at 08/16/13 1232 Last data filed at 08/15/13 2140  Gross per 24 hour  Intake   1080 ml  Output    700 ml  Net    380 ml   Filed Weights   08/14/13 0614 08/15/13 0537 08/16/13 0602  Weight: 172 lb 12.8 oz (78.382 kg) 167 lb 1.7 oz (75.8 kg) 167 lb 14.4 oz (76.159 kg)    PHYSICAL EXAM  General: Pleasant, NAD. Neuro: Alert and oriented X 3. Moves all extremities spontaneously. Psych: Normal affect. HEENT:  Normal  Neck: Supple without  bruits or JVD. Lungs:  Resp regular and unlabored, CTA. Heart: RRR no s3, s4, or murmurs. Abdomen: Soft, non-tender, non-distended, BS + x 4.  Extremities: No clubbing, cyanosis or edema. DP/PT/Radials 2+ and equal bilaterally.  Accessory Clinical Findings  CBC No results found for this basename: WBC, NEUTROABS, HGB, HCT, MCV, PLT,  in the last 72 hours Basic Metabolic Panel  Recent Labs  08/15/13 0610 08/16/13 0340  NA 135* 136*  K 4.8 5.5*  CL 94* 95*  CO2 27 30  GLUCOSE 123* 60*  BUN 37* 54*  CREATININE 1.46* 1.97*  CALCIUM 9.4 9.0   Liver Function Tests No results found for this basename: AST, ALT, ALKPHOS, BILITOT, PROT, ALBUMIN,  in the last 72 hours No results found for this basename: LIPASE, AMYLASE,  in the last 72 hours Cardiac Enzymes No results found for this basename: CKTOTAL, CKMB, CKMBINDEX, TROPONINI,  in the last 72 hours BNP No components found with this basename: POCBNP,  D-Dimer No results found for this basename: DDIMER,  in the last 72 hours Hemoglobin A1C No results found  for this basename: HGBA1C,  in the last 72 hours Fasting Lipid Panel No results found for this basename: CHOL, HDL, LDLCALC, TRIG, CHOLHDL, LDLDIRECT,  in the last 72 hours Thyroid Function Tests No results found for this basename: TSH, T4TOTAL, FREET3, T3FREE, THYROIDAB,  in the last 72 hours  TELE  NSR  ECG    Radiology/Studies  Dg Chest 2 View  08/14/2013   CLINICAL DATA:  Shortness of Breath  EXAM: CHEST  2 VIEW  COMPARISON:  Aug 11, 2013  FINDINGS: There is no edema or consolidation. Heart is borderline enlarged with pulmonary vascularity consistent with mild pulmonary venous hypertension. The patient is status post coronary artery bypass grafting. No adenopathy. No bone lesions.  IMPRESSION: Evidence of a degree of volume overload without edema or consolidation. No new opacity.   Electronically Signed   By: Lowella Grip M.D.   On: 08/14/2013 11:04   Dg Chest 2  View  08/11/2013   CLINICAL DATA:  Shortness of breath.  EXAM: CHEST  2 VIEW  COMPARISON:  Chest radiograph performed 08/02/2013  FINDINGS: The lungs are well-aerated. Vascular congestion is noted. Increased interstitial markings may reflect mild interstitial edema or possibly pneumonia. No pleural effusion or pneumothorax is seen.  The heart is enlarged. The patient is status post median sternotomy, with evidence of prior CABG. No acute osseous abnormalities are seen.  IMPRESSION: Vascular congestion and cardiomegaly. Increased interstitial markings may reflect mild interstitial edema or possibly pneumonia.   Electronically Signed   By: Garald Balding M.D.   On: 08/11/2013 01:57   Dg Chest Portable 1 View  08/02/2013   CLINICAL DATA:  Chest pain  EXAM: PORTABLE CHEST - 1 VIEW  COMPARISON:  07/15/2013  FINDINGS: Stable moderate cardiomegaly. Status post CABG. Mild pulmonary hyperinflation with chronic interstitial coarsening. No edema or consolidation. No effusion or pneumothorax.  IMPRESSION: Cardiomegaly and pulmonary venous congestion.   Electronically Signed   By: Jorje Guild M.D.   On: 08/02/2013 08:33    ASSESSMENT AND PLAN 1. Acute on chronic diastolic CHF. Continue intravenous lasix 80 mg bid. Continue other therapy with Nitrates, hydralazine, aldactone and low dose coreg. When he is close to dry weight of 162 lbs can switch Lasix to po but will require a higher dose than before to maintain weight. Clearly needs to do better with diet since he eats a lot of processed meats. Chest xray reviewed by myself shows slight improvement in vascular congestion.  2. Atypical chest pain. Patient has ruled out for infarct. Pain may be aggravated by fluid overload. I would not be anxious to perform cardiac cath in the absence of objective evidence of ischemia given history of cocaine abuse. Myoview in December showed no ischemia. Continue medical management. Agree with transitioning nitrates to po.  3. HTN.  On multiple medications at maximal doses. Compliance with medications and diet stressed. May improve with further diuresis.  4. Chronic anemia  5. History of cocaine abuse.   Renal function considerably worse today.  Agree with holding IV lasix today and reassess tomorrow.  Signed, Darlin Coco MD

## 2013-08-16 NOTE — Plan of Care (Signed)
Problem: Phase II Progression Outcomes Goal: Other Phase II Outcomes/Goals Outcome: Completed/Met Date Met:  08/16/13 Patient continues to have sharp pain in the left upper chest region that radiates to the left shoulder.  It is intermittent and relieved with Morphine.  Patient has remained stable this shift.  Resting quietly at present.  Will continue to monitor.

## 2013-08-17 DIAGNOSIS — I214 Non-ST elevation (NSTEMI) myocardial infarction: Secondary | ICD-10-CM

## 2013-08-17 DIAGNOSIS — I498 Other specified cardiac arrhythmias: Secondary | ICD-10-CM

## 2013-08-17 LAB — BASIC METABOLIC PANEL
BUN: 61 mg/dL — ABNORMAL HIGH (ref 6–23)
CALCIUM: 9.1 mg/dL (ref 8.4–10.5)
CO2: 26 mEq/L (ref 19–32)
Chloride: 91 mEq/L — ABNORMAL LOW (ref 96–112)
Creatinine, Ser: 1.98 mg/dL — ABNORMAL HIGH (ref 0.50–1.35)
GFR, EST AFRICAN AMERICAN: 38 mL/min — AB (ref >90)
GFR, EST NON AFRICAN AMERICAN: 33 mL/min — AB (ref >90)
Glucose, Bld: 141 mg/dL — ABNORMAL HIGH (ref 70–99)
Potassium: 5.3 mEq/L (ref 3.7–5.3)
SODIUM: 132 meq/L — AB (ref 137–147)

## 2013-08-17 MED ORDER — CARVEDILOL 6.25 MG PO TABS
6.2500 mg | ORAL_TABLET | Freq: Two times a day (BID) | ORAL | Status: DC
  Administered 2013-08-17 – 2013-08-19 (×4): 6.25 mg via ORAL
  Filled 2013-08-17 (×6): qty 1

## 2013-08-17 MED ORDER — FUROSEMIDE 80 MG PO TABS
80.0000 mg | ORAL_TABLET | Freq: Every day | ORAL | Status: DC
  Administered 2013-08-18: 80 mg via ORAL
  Filled 2013-08-17 (×2): qty 1

## 2013-08-17 NOTE — Progress Notes (Signed)
Patient Name: Charles [name] Date of Encounter: 08/17/2013     Principal Problem:   Chest pain Active Problems:   Hypertension   Chronic systolic heart failure   CAD (coronary artery disease)   Ischemic cardiomyopathy   Chronic kidney disease (CKD), stage III (moderate)   Moderate to severe pulmonary hypertension   Seizure disorder    SUBJECTIVE  The patient denies any chest discomfort or dyspnea today.  He received morphine twice during the night for chest pain however.  Renal function is worse today.  CURRENT MEDS . amLODipine  10 mg Oral Daily  . aspirin EC  81 mg Oral Daily  . atorvastatin  20 mg Oral q1800  . carvedilol  3.125 mg Oral BID WC  . divalproex  500 mg Oral BID  . enoxaparin (LOVENOX) injection  40 mg Subcutaneous Q24H  . ferrous sulfate  325 mg Oral BID WC  . gabapentin  100 mg Oral BID  . isosorbide-hydrALAZINE  2 tablet Oral BID  . lisinopril  20 mg Oral BID  . pantoprazole  40 mg Oral BID  . phenytoin  200 mg Oral q morning - 10a  . phenytoin  300 mg Oral QHS  . tiZANidine  2 mg Oral Daily    OBJECTIVE  Filed Vitals:   08/16/13 0926 08/16/13 1507 08/16/13 2208 08/17/13 0724  BP: 127/73 110/63 137/66 156/71  Pulse: 85 66 80 86  Temp:  97.1 F (36.2 C) 97.1 F (36.2 C) 97.3 F (36.3 C)  TempSrc:  Oral Oral Oral  Resp: 16 18 18 18   Height:      Weight:    171 lb 4.8 oz (77.7 kg)  SpO2: 98% 97% 98% 100%    Intake/Output Summary (Last 24 hours) at 08/17/13 0801 Last data filed at 08/17/13 0735  Gross per 24 hour  Intake   1200 ml  Output    825 ml  Net    375 ml   Filed Weights   08/15/13 0537 08/16/13 0602 08/17/13 0724  Weight: 167 lb 1.7 oz (75.8 kg) 167 lb 14.4 oz (76.159 kg) 171 lb 4.8 oz (77.7 kg)    PHYSICAL EXAM  General: Pleasant, NAD. Neuro: Alert and oriented X 3. Moves all extremities spontaneously. Psych: Normal affect. HEENT:  Normal  Neck: Supple without bruits or JVD. Lungs:  Resp regular and unlabored,  CTA. Heart: RRR no s3, s4, or murmurs. Abdomen: Soft, non-tender, non-distended, BS + x 4.  Extremities: No clubbing, cyanosis or edema. DP/PT/Radials 2+ and equal bilaterally.  Accessory Clinical Findings  CBC No results found for this basename: WBC, NEUTROABS, HGB, HCT, MCV, PLT,  in the last 72 hours Basic Metabolic Panel  Recent Labs  08/16/13 0340 08/17/13 0443  NA 136* 132*  K 5.5* 5.3  CL 95* 91*  CO2 30 26  GLUCOSE 60* 141*  BUN 54* 61*  CREATININE 1.97* 1.98*  CALCIUM 9.0 9.1   Liver Function Tests No results found for this basename: AST, ALT, ALKPHOS, BILITOT, PROT, ALBUMIN,  in the last 72 hours No results found for this basename: LIPASE, AMYLASE,  in the last 72 hours Cardiac Enzymes No results found for this basename: CKTOTAL, CKMB, CKMBINDEX, TROPONINI,  in the last 72 hours BNP No components found with this basename: POCBNP,  D-Dimer No results found for this basename: DDIMER,  in the last 72 hours Hemoglobin A1C No results found for this basename: HGBA1C,  in the last 72 hours Fasting Lipid Panel No  results found for this basename: CHOL, HDL, LDLCALC, TRIG, CHOLHDL, LDLDIRECT,  in the last 72 hours Thyroid Function Tests No results found for this basename: TSH, T4TOTAL, FREET3, T3FREE, THYROIDAB,  in the last 72 hours  TELE    ECG    Radiology/Studies  Dg Chest 2 View  08/14/2013   CLINICAL DATA:  Shortness of Breath  EXAM: CHEST  2 VIEW  COMPARISON:  Aug 11, 2013  FINDINGS: There is no edema or consolidation. Heart is borderline enlarged with pulmonary vascularity consistent with mild pulmonary venous hypertension. The patient is status post coronary artery bypass grafting. No adenopathy. No bone lesions.  IMPRESSION: Evidence of a degree of volume overload without edema or consolidation. No new opacity.   Electronically Signed   By: Lowella Grip M.D.   On: 08/14/2013 11:04   Dg Chest 2 View  08/11/2013   CLINICAL DATA:  Shortness of breath.   EXAM: CHEST  2 VIEW  COMPARISON:  Chest radiograph performed 08/02/2013  FINDINGS: The lungs are well-aerated. Vascular congestion is noted. Increased interstitial markings may reflect mild interstitial edema or possibly pneumonia. No pleural effusion or pneumothorax is seen.  The heart is enlarged. The patient is status post median sternotomy, with evidence of prior CABG. No acute osseous abnormalities are seen.  IMPRESSION: Vascular congestion and cardiomegaly. Increased interstitial markings may reflect mild interstitial edema or possibly pneumonia.   Electronically Signed   By: Garald Balding M.D.   On: 08/11/2013 01:57   Dg Chest Portable 1 View  08/02/2013   CLINICAL DATA:  Chest pain  EXAM: PORTABLE CHEST - 1 VIEW  COMPARISON:  07/15/2013  FINDINGS: Stable moderate cardiomegaly. Status post CABG. Mild pulmonary hyperinflation with chronic interstitial coarsening. No edema or consolidation. No effusion or pneumothorax.  IMPRESSION: Cardiomegaly and pulmonary venous congestion.   Electronically Signed   By: Jorje Guild M.D.   On: 08/02/2013 08:33    ASSESSMENT AND PLAN 1. Acute on chronic diastolic CHF.  Lasix is on hold because of rising BUN and creatinine.  It may be unrealistic to try to get him down into the low 160s on his weight.  Currently his lungs are clear and chest x-ray has improved he has no edema.  2. Atypical chest pain. Patient has ruled out for infarct. Pain may be aggravated by fluid overload. I would not be anxious to perform cardiac cath in the absence of objective evidence of ischemia given history of cocaine abuse. Myoview in December showed no ischemia. Continue medical management. Agree with transitioning nitrates to po.  3. HTN. On multiple medications at maximal doses. Compliance with medications and diet stressed. May improve with further diuresis.  4. Chronic anemia  5. History of cocaine abuse.   Plan, we'll withhold diuretics again today and observe spots and  renal function.  Encouraged him to increase oral fluid intake today.  Hyperkalemia has improved today.    Signed, Darlin Coco MD

## 2013-08-17 NOTE — Progress Notes (Signed)
The patient received PRN morphine twice overnight for the same type of chest pain he has been experiencing on this admission.  He states that it occurs when he takes a deep breath.  His VS were stable overnight.

## 2013-08-17 NOTE — Progress Notes (Signed)
[facility] PROGRESS NOTE Interim History: 68 yo BM PMHx CAD, S/P three-vessel coronary artery bypass grafting in 2009, ischemic cardiomyopathy, chronic systolic and diastolic congestive heart failure, Hx cocaine abuse (testing positive for cocaine in May and April of 2015 on recent hospitalizations) admitted early this morning presenting with chest pain. Patient found to be hypertensive was started on a nitroglycerin drip, now diuresing.  Filed Weights   08/15/13 0537 08/16/13 0602 08/17/13 0724  Weight: 75.8 kg (167 lb 1.7 oz) 76.159 kg (167 lb 14.4 oz) 77.7 kg (171 lb 4.8 oz)        Intake/Output Summary (Last 24 hours) at 08/17/13 0855 Last data filed at 08/17/13 0735  Gross per 24 hour  Intake   1200 ml  Output    825 ml  Net    375 ml     Assessment/Plan: Chest pain  - Typical and atypical features,EKG did not reveal ischemic changes. - Consulted cardiology recommended no further intervention, Myoview in December showed NO ischemia- - Continue aspirin, ACE inhibitor,and statin therapy   Acute chronic systolic and diastolic congestive heart failure: - due to cocaine and crack abuse. - Consulted cardiology, dry weight of 170 - Cr has stabilize, hold lasix for today again, resume orally in am at a higher home dose. - Monitor electrolytes replete as needed. - Mild increase in weight, b-met daily. Cont bidil. Insurance will cover. - increase coreg cont lisinopril.  Hypertensive emergency  -Transitioned from nitroglycerin drip to PO Imdur 60 mg daily  -Blood pressures improved, cont BiDil.  - Cont amlodipine to 10 mg daily and continued home dose clonidine 0.2 mg BID, continued Lasix IV 51m daily and lisinopril 20 mg BID.   Cocaine abuse  - Patient counseled on cocaine abuse and its negative effects on his heart  - Admitted to smoking crack 1 week ago  - He stated to previous MD "I'm not going to do that anymore".   Recent gastric antrum AVM with bleeding  /anemia  -current Hgb stable >8.5    Code Status: full Family Communication: none  Disposition Plan: inpatient   Consultants:  card  Procedures: 4/29 echocardiogram  -LVEF= 50% to 55%.  -Possible mild hypokinesis basal-midinferolateral and inferior myocardium. - (grade 2 diastolic dysfunction). - Left atrium: mildly dilated. - Right ventricle: mildly dilated. Systolic function was mildly reduced. - Pulmonary arteries: PA peak pressure: 512mHg (S).   Antibiotics:  None  HPI/Subjective: No complains  Objective: Filed Vitals:   08/16/13 0926 08/16/13 1507 08/16/13 2208 08/17/13 0724  BP: 127/73 110/63 137/66 156/71  Pulse: 85 66 80 86  Temp:  97.1 F (36.2 C) 97.1 F (36.2 C) 97.3 F (36.3 C)  TempSrc:  Oral Oral Oral  Resp: _0 Height:      Weight:    77.7 kg (171 lb 4.8 oz)  SpO2: 98% 97% 98% 100%     Exam:  General: Alert, awake, oriented x3, in no acute distress.  HEENT: No bruits, no goiter. -JVD Heart: Regular rate and rhythm, without murmurs, rubs, gallops.  Lungs: Good air movement, clear  Abdomen: Soft, nontender, nondistended, positive bowel sounds.     Data Reviewed: Basic Metabolic Panel:  Recent Labs Lab 08/13/13 0840 08/14/13 0458 08/15/13 0610 08/16/13 0340 08/17/13 0443  NA 135* 136* 135* 136* 132*  K 4.4 4.8 4.8 5.5* 5.3  CL 95* 98 94* 95* 91*  CO2 _1 GLUCOSE 92 90 123* 60* 141*  BUN 21 28* 37* 54* 61*  CREATININE 1.33 1.37* 1.46* 1.97* 1.98*  CALCIUM 9.0 8.9 9.4 9.0 9.1   Liver Function Tests:  Recent Labs Lab 08/11/13 0112  AST 57*  ALT 27  ALKPHOS 112  BILITOT 0.3  PROT 7.2  ALBUMIN 3.0*   No results found for this basename: LIPASE, AMYLASE,  in the last 168 hours No results found for this basename: AMMONIA,  in the last 168 hours CBC:  Recent Labs Lab 08/11/13 0112  WBC 4.7  NEUTROABS 3.1  HGB 8.5*  HCT 26.3*  MCV 84.6  PLT 250   Cardiac Enzymes:  Recent Labs Lab  08/11/13 0656 08/11/13 0827 08/11/13 1128  TROPONINI <0.30 <0.30 <0.30   BNP (last 3 results)  Recent Labs  07/19/13 0430 08/02/13 0808 08/11/13 0112  PROBNP 981.3* 1930.0* 2998.0*   CBG:  Recent Labs Lab 08/11/13 0631 08/16/13 0609  GLUCAP 123* 113*    Recent Results (from the past 240 hour(s))  MRSA PCR SCREENING     Status: Abnormal   Collection Time    08/11/13  9:41 AM      Result Value Ref Range Status   MRSA by PCR POSITIVE (*) NEGATIVE Final   Comment:            The GeneXpert MRSA Assay (FDA     approved for NASAL specimens     only), is one component of a     comprehensive MRSA colonization     surveillance program. It is not     intended to diagnose MRSA     infection nor to guide or     monitor treatment for     MRSA infections.     RESULT CALLED TO, READ BACK BY AND VERIFIED WITH:     CARAHER RN 11:30 08/11/13 ([email or web address])     Studies: No results found.  Scheduled Meds: . amLODipine  10 mg Oral Daily  . aspirin EC  81 mg Oral Daily  . atorvastatin  20 mg Oral q1800  . carvedilol  3.125 mg Oral BID WC  . divalproex  500 mg Oral BID  . enoxaparin (LOVENOX) injection  40 mg Subcutaneous Q24H  . ferrous sulfate  325 mg Oral BID WC  . gabapentin  100 mg Oral BID  . isosorbide-hydrALAZINE  2 tablet Oral BID  . lisinopril  20 mg Oral BID  . pantoprazole  40 mg Oral BID  . phenytoin  200 mg Oral q morning - 10a  . phenytoin  300 mg Oral QHS  . tiZANidine  2 mg Oral Daily   Continuous Infusions:    Charlynne Cousins  [facility] Pager [phone] If 8PM-8AM, please contact night-coverage at [email or web address], password [facility] 08/17/2013, 8:55 AM  LOS: 6 days

## 2013-08-18 ENCOUNTER — Encounter (HOSPITAL_COMMUNITY): Payer: Self-pay | Admitting: Physician Assistant

## 2013-08-18 LAB — CBC
HCT: 24.8 % — ABNORMAL LOW (ref 39.0–52.0)
HEMOGLOBIN: 7.8 g/dL — AB (ref 13.0–17.0)
MCH: 27.3 pg (ref 26.0–34.0)
MCHC: 31.5 g/dL (ref 30.0–36.0)
MCV: 86.7 fL (ref 78.0–100.0)
Platelets: 295 10*3/uL (ref 150–400)
RBC: 2.86 MIL/uL — ABNORMAL LOW (ref 4.22–5.81)
RDW: 20.5 % — ABNORMAL HIGH (ref 11.5–15.5)
WBC: 4.1 10*3/uL (ref 4.0–10.5)

## 2013-08-18 LAB — BASIC METABOLIC PANEL
BUN: 52 mg/dL — ABNORMAL HIGH (ref 6–23)
BUN: 46 mg/dL — ABNORMAL HIGH (ref 6–23)
CO2: 27 meq/L (ref 19–32)
CO2: 28 mEq/L (ref 19–32)
Calcium: 8.9 mg/dL (ref 8.4–10.5)
Calcium: 8.8 mg/dL (ref 8.4–10.5)
Chloride: 97 mEq/L (ref 96–112)
Chloride: 97 mEq/L (ref 96–112)
Creatinine, Ser: 1.72 mg/dL — ABNORMAL HIGH (ref 0.50–1.35)
Creatinine, Ser: 1.59 mg/dL — ABNORMAL HIGH (ref 0.50–1.35)
GFR calc Af Amer: 45 mL/min — ABNORMAL LOW (ref >90)
GFR calc Af Amer: 49 mL/min — ABNORMAL LOW (ref >90)
GFR calc non Af Amer: 39 mL/min — ABNORMAL LOW (ref >90)
GFR calc non Af Amer: 43 mL/min — ABNORMAL LOW (ref >90)
Glucose, Bld: 86 mg/dL (ref 70–99)
Glucose, Bld: 119 mg/dL — ABNORMAL HIGH (ref 70–99)
POTASSIUM: 5.5 meq/L — AB (ref 3.7–5.3)
Potassium: 5.6 mEq/L — ABNORMAL HIGH (ref 3.7–5.3)
SODIUM: 136 meq/L — AB (ref 137–147)
Sodium: 136 mEq/L — ABNORMAL LOW (ref 137–147)

## 2013-08-18 MED ORDER — SODIUM POLYSTYRENE SULFONATE 15 GM/60ML PO SUSP
15.0000 g | Freq: Once | ORAL | Status: AC
  Administered 2013-08-18: 15 g via ORAL
  Filled 2013-08-18: qty 60

## 2013-08-18 MED ORDER — SODIUM POLYSTYRENE SULFONATE 15 GM/60ML PO SUSP
30.0000 g | Freq: Once | ORAL | Status: AC
  Administered 2013-08-18: 30 g via ORAL
  Filled 2013-08-18: qty 120

## 2013-08-18 NOTE — Care Management Note (Addendum)
  Page 1 of 1   08/19/2013     1:35:15 PM CARE MANAGEMENT NOTE 08/19/2013  Patient:  Charles [name], Charles [name]   Account Number:  [id number]  Date Initiated:  08/12/2013  Documentation initiated by:  Elissa Hefty  Subjective/Objective Assessment:   adm w ch pain     Action/Plan:   told nse is homeless   Anticipated DC Date:  08/19/2013   Anticipated DC Plan:  HOME/SELF CARE  In-house referral  Clinical Social Worker         Choice offered to / List presented to:             Status of service:  Completed, signed off Medicare Important Message given?  YES (If response is "NO", the following Medicare IM given date fields will be blank) Date Medicare IM given:  08/15/2013 Date Additional Medicare IM given:    Discharge Disposition:  HOME/SELF CARE  Per UR Regulation:  Reviewed for med. necessity/level of care/duration of stay  If discussed at [place] of Stay Meetings, dates discussed:    Comments:  Shaman Muscarella RN, BSN, MSHL, CCM  Nurse - Case Manager, ([facility])  [phone]  08/19/2013 Social:  currently living in [place] and wishes to find housing in [place]. (SW/Donna Crowder active) Transportation:  Patient states issues with transportation from [employer] to [place]. [facility] - non elligible d/t cocaine usage [facility] - non elligible d/t cocaine usage CHF Clinic:  yes (New) Disposition Plan:  Home / Self care

## 2013-08-18 NOTE — Progress Notes (Signed)
UR completed Niamya Vittitow K. Lowell Mcgurk, RN, BSN, Loraine, CCM  08/18/2013 12:53 PM

## 2013-08-18 NOTE — Progress Notes (Signed)
[facility] PROGRESS NOTE Interim History: 68 yo BM PMHx CAD, S/P three-vessel coronary artery bypass grafting in 2009, ischemic cardiomyopathy, chronic systolic and diastolic congestive heart failure, Hx cocaine abuse (testing positive for cocaine in May and April of 2015 on recent hospitalizations) admitted early this morning presenting with chest pain. Patient found to be hypertensive was started on a nitroglycerin drip, now diuresing.  Filed Weights   08/16/13 0602 08/17/13 0724 08/18/13 0638  Weight: 76.159 kg (167 lb 14.4 oz) 77.7 kg (171 lb 4.8 oz) 77.656 kg (171 lb 3.2 oz)        Intake/Output Summary (Last 24 hours) at 08/18/13 0847 Last data filed at 08/18/13 2707  Gross per 24 hour  Intake   1170 ml  Output   1275 ml  Net   -105 ml     Assessment/Plan: Acute chronic systolic and diastolic congestive heart failure: - due to cocaine and crack abuse. - Consulted cardiology, dry weight of 170. - Cr trending down, hold lasix for today again, resume orally in am at a higher home dose. - Monitor electrolytes replete as needed. - Mild increase in weight, b-met daily. Cont bidil. Insurance will cover. - Increase coreg cont lisinopril.  Chest pain  - Typical and atypical features,EKG did not reveal ischemic changes. - Consulted cardiology recommended no further intervention, Myoview in December showed NO ischemia. - Continue aspirin, ACE inhibitor,and statin therapy.  Hyperkalemia: - Due to over diuresis and aldactone. Which was held. - Kayexalate repeat in after noon.  Hypertensive emergency  -Transitioned from nitroglycerin drip to PO Imdur 60 mg daily  -Blood pressures improved, cont BiDil.  - Cont amlodipine to 10 mg daily and continued home dose clonidine 0.2 mg BID, continued Lasix IV 79m daily and lisinopril 20 mg BID.   Cocaine abuse  - Patient counseled on cocaine abuse and its negative effects on his heart  - Admitted to smoking crack 1 week ago  -  He stated to previous MD "I'm not going to do that anymore".   Recent gastric antrum AVM with bleeding /anemia  -current Hgb stable >8.5    Code Status: full Family Communication: none  Disposition Plan: inpatient   Consultants:  card  Procedures: 4/29 echocardiogram  -LVEF= 50% to 55%.  -Possible mild hypokinesis basal-midinferolateral and inferior myocardium. - (grade 2 diastolic dysfunction). - Left atrium: mildly dilated.- Right ventricle: mildly dilated. Systolic function was mildly reduced. - Pulmonary arteries: PA peak pressure: 520mHg (S).   Antibiotics:  None  HPI/Subjective: No complains  Objective: Filed Vitals:   08/17/13 2111 08/18/13 0638 08/18/13 0640 08/18/13 0833  BP: 133/67  136/68 121/67  Pulse: 76  73 76  Temp: 97.5 F (36.4 C)     TempSrc: Oral     Resp: 18  18   Height:      Weight:  77.656 kg (171 lb 3.2 oz)    SpO2: 97%  98% 97%     Exam:  General: Alert, awake, oriented x3, in no acute distress.  HEENT: No bruits, no goiter. -JVD Heart: Regular rate and rhythm, without murmurs, rubs, gallops.  Lungs: Good air movement, clear  Abdomen: Soft, nontender, nondistended, positive bowel sounds.     Data Reviewed: Basic Metabolic Panel:  Recent Labs Lab 08/14/13 0458 08/15/13 0610 08/16/13 0340 08/17/13 0443 08/18/13 0509  NA 136* 135* 136* 132* 136*  K 4.8 4.8 5.5* 5.3 5.5*  CL 98 94* 95* 91* 97  CO2 27 27 30  26  27  GLUCOSE 90 123* 60* 141* 86  BUN 28* 37* 54* 61* 52*  CREATININE 1.37* 1.46* 1.97* 1.98* 1.72*  CALCIUM 8.9 9.4 9.0 9.1 8.9   Liver Function Tests: No results found for this basename: AST, ALT, ALKPHOS, BILITOT, PROT, ALBUMIN,  in the last 168 hours No results found for this basename: LIPASE, AMYLASE,  in the last 168 hours No results found for this basename: AMMONIA,  in the last 168 hours CBC: No results found for this basename: WBC, NEUTROABS, HGB, HCT, MCV, PLT,  in the last 168 hours Cardiac  Enzymes:  Recent Labs Lab 08/11/13 1128  TROPONINI <0.30   BNP (last 3 results)  Recent Labs  07/19/13 0430 08/02/13 0808 08/11/13 0112  PROBNP 981.3* 1930.0* 2998.0*   CBG:  Recent Labs Lab 08/16/13 0609  GLUCAP 113*    Recent Results (from the past 240 hour(s))  MRSA PCR SCREENING     Status: Abnormal   Collection Time    08/11/13  9:41 AM      Result Value Ref Range Status   MRSA by PCR POSITIVE (*) NEGATIVE Final   Comment:            The GeneXpert MRSA Assay (FDA     approved for NASAL specimens     only), is one component of a     comprehensive MRSA colonization     surveillance program. It is not     intended to diagnose MRSA     infection nor to guide or     monitor treatment for     MRSA infections.     RESULT CALLED TO, READ BACK BY AND VERIFIED WITH:     CARAHER RN 11:30 08/11/13 ([email or web address])     Studies: No results found.  Scheduled Meds: . amLODipine  10 mg Oral Daily  . aspirin EC  81 mg Oral Daily  . atorvastatin  20 mg Oral q1800  . carvedilol  6.25 mg Oral BID WC  . divalproex  500 mg Oral BID  . enoxaparin (LOVENOX) injection  40 mg Subcutaneous Q24H  . ferrous sulfate  325 mg Oral BID WC  . furosemide  80 mg Oral Daily  . gabapentin  100 mg Oral BID  . isosorbide-hydrALAZINE  2 tablet Oral BID  . lisinopril  20 mg Oral BID  . pantoprazole  40 mg Oral BID  . phenytoin  200 mg Oral q morning - 10a  . phenytoin  300 mg Oral QHS  . sodium polystyrene  30 g Oral Once  . tiZANidine  2 mg Oral Daily   Continuous Infusions:    Charlynne Cousins  [facility] Pager [phone] If 8PM-8AM, please contact night-coverage at [email or web address], password [facility] 08/18/2013, 8:47 AM  LOS: 7 days

## 2013-08-18 NOTE — Progress Notes (Signed)
Patient Name: Charles [name] Date of Encounter: 08/18/2013     Principal Problem:   Chest pain Active Problems:   Hypertension   Chronic systolic heart failure   CAD (coronary artery disease)   Ischemic cardiomyopathy   Chronic kidney disease (CKD), stage III (moderate)   Moderate to severe pulmonary hypertension   Seizure disorder    SUBJECTIVE  Feeling good. No chest or SOB.   CURRENT MEDS . amLODipine  10 mg Oral Daily  . aspirin EC  81 mg Oral Daily  . atorvastatin  20 mg Oral q1800  . carvedilol  6.25 mg Oral BID WC  . divalproex  500 mg Oral BID  . enoxaparin (LOVENOX) injection  40 mg Subcutaneous Q24H  . ferrous sulfate  325 mg Oral BID WC  . furosemide  80 mg Oral Daily  . gabapentin  100 mg Oral BID  . isosorbide-hydrALAZINE  2 tablet Oral BID  . lisinopril  20 mg Oral BID  . pantoprazole  40 mg Oral BID  . phenytoin  200 mg Oral q morning - 10a  . phenytoin  300 mg Oral QHS  . tiZANidine  2 mg Oral Daily    OBJECTIVE  Filed Vitals:   08/17/13 2111 08/18/13 0638 08/18/13 0640 08/18/13 0833  BP: 133/67  136/68 121/67  Pulse: 76  73 76  Temp: 97.5 F (36.4 C)     TempSrc: Oral     Resp: 18  18   Height:      Weight:  171 lb 3.2 oz (77.656 kg)    SpO2: 97%  98% 97%    Intake/Output Summary (Last 24 hours) at 08/18/13 1106 Last data filed at 08/18/13 1021  Gross per 24 hour  Intake   1170 ml  Output   1275 ml  Net   -105 ml   Filed Weights   08/16/13 0602 08/17/13 0724 08/18/13 7001  Weight: 167 lb 14.4 oz (76.159 kg) 171 lb 4.8 oz (77.7 kg) 171 lb 3.2 oz (77.656 kg)    PHYSICAL EXAM  General: Pleasant, NAD. Neuro: Alert and oriented X 3. Moves all extremities spontaneously. Psych: Normal affect. HEENT:  Normal  Neck: Supple without bruits or JVD. Lungs:  Resp regular and unlabored, CTA. Heart: RRR no s3, s4, or murmurs. Abdomen: Soft, non-tender, non-distended, BS + x 4.  Extremities: No clubbing, cyanosis or edema.  DP/PT/Radials 2+ and equal bilaterally.   Basic Metabolic Panel  Recent Labs  08/17/13 0443 08/18/13 0509  NA 132* 136*  K 5.3 5.5*  CL 91* 97  CO2 26 27  GLUCOSE 141* 86  BUN 61* 52*  CREATININE 1.98* 1.72*  CALCIUM 9.1 8.9     Radiology/Studies  Dg Chest 2 View  08/14/2013   CLINICAL DATA:  Shortness of Breath  EXAM: CHEST  2 VIEW  COMPARISON:  Aug 11, 2013  FINDINGS: There is no edema or consolidation. Heart is borderline enlarged with pulmonary vascularity consistent with mild pulmonary venous hypertension. The patient is status post coronary artery bypass grafting. No adenopathy. No bone lesions.  IMPRESSION: Evidence of a degree of volume overload without edema or consolidation. No new opacity.   Electronically Signed   By: Lowella Grip M.D.   On: 08/14/2013 11:04   Dg Chest 2 View  08/11/2013   CLINICAL DATA:  Shortness of breath.  EXAM: CHEST  2 VIEW  COMPARISON:  Chest radiograph performed 08/02/2013  FINDINGS: The lungs are well-aerated. Vascular congestion is noted. Increased  interstitial markings may reflect mild interstitial edema or possibly pneumonia. No pleural effusion or pneumothorax is seen.  The heart is enlarged. The patient is status post median sternotomy, with evidence of prior CABG. No acute osseous abnormalities are seen.  IMPRESSION: Vascular congestion and cardiomegaly. Increased interstitial markings may reflect mild interstitial edema or possibly pneumonia.   Electronically Signed   By: Garald Balding M.D.   On: 08/11/2013 01:57   Dg Chest Portable 1 View  08/02/2013   CLINICAL DATA:  Chest pain  EXAM: PORTABLE CHEST - 1 VIEW  COMPARISON:  07/15/2013  FINDINGS: Stable moderate cardiomegaly. Status post CABG. Mild pulmonary hyperinflation with chronic interstitial coarsening. No edema or consolidation. No effusion or pneumothorax.  IMPRESSION: Cardiomegaly and pulmonary venous congestion.   Electronically Signed   By: Jorje Guild M.D.   On:  08/02/2013 08:33    ASSESSMENT AND PLAN  This is a 68 y.o. male with a past medical history significant for hx/o cocaine abuse, GI bleed, systolic CHF, EF 72-62% with grade 2 diastolic dysfunction, CAD s/p CABG 2009, renal artery stenosis and stage 3 CKD who presented to [facility] on 08/11/13 with chest pain and SOB and found to be in hypertensive urgency.   Acute chronic systolic and diastolic congestive heart failure:  -- Due to cocaine and crack abuse.   -- Dry weight ~162 lbs currently 171 lbs, down 1.4 L -- Cr trending down, resumed on oral Lasix 47m this AM. Continue this   -- Mild increase in weight, b-met daily. Cont bidil. -- Increase coreg cont lisinopril.   Chest pain  -- Typical and atypical features,EKG did not reveal ischemic changes.  -- Myoview in December showed NO ischemia.  -- Continue aspirin, ACE inhibitor,and statin therapy.   Hyperkalemia:  -- Due to over diuresis and aldactone. Which was held.  -- Kayexalate repeat in after noon.   Hypertensive emergency  -- Transitioned from nitroglycerin drip to PO Imdur 60 mg daily  -- Blood pressures improved, cont BiDil.  -- Cont amlodipine to 10 mg daily and continued home dose clonidine 0.2 mg BID, continued Lasix IV 861mdaily and lisinopril 20 mg BID.    Cocaine abuse  -- Patient counseled on cocaine abuse and its negative effects on his heart  -- Admitted to smoking crack 1 week ago  -- He stated to previous MD "I'm not going to do that anymore".   Recent gastric antrum AVM with bleeding /anemia  -- Current Hgb stable >8.5  -- Repeat CBC pending    Signed, KaPerry MountA-C  Pager [phone]

## 2013-08-18 NOTE — Progress Notes (Signed)
Personally seen and examined. Agree with above. Feels well. No LE edema, compression hose.  Continue with current PO lasix 80. No other changes.  Creat improved.  Possible DC tomorrow.  Candee Furbish, MD

## 2013-08-18 NOTE — Progress Notes (Signed)
Pt a/o, c/o pain PRN morphine given as ordered, pt upset today about his intake stating "people are lying on me, I didn't drink that much fluid today" pt explained how many ml's different cups, vss, pt stable

## 2013-08-18 NOTE — Progress Notes (Signed)
Pt a/o, c/o pain prn morphine given as ordered, pt vss, pt stable

## 2013-08-19 LAB — BASIC METABOLIC PANEL
BUN: 51 mg/dL — ABNORMAL HIGH (ref 6–23)
CHLORIDE: 92 meq/L — AB (ref 96–112)
CO2: 27 mEq/L (ref 19–32)
Calcium: 8.6 mg/dL (ref 8.4–10.5)
Creatinine, Ser: 1.84 mg/dL — ABNORMAL HIGH (ref 0.50–1.35)
GFR calc non Af Amer: 36 mL/min — ABNORMAL LOW (ref >90)
GFR, EST AFRICAN AMERICAN: 41 mL/min — AB (ref >90)
Glucose, Bld: 149 mg/dL — ABNORMAL HIGH (ref 70–99)
Potassium: 5.2 mEq/L (ref 3.7–5.3)
Sodium: 133 mEq/L — ABNORMAL LOW (ref 137–147)

## 2013-08-19 LAB — CBC
HEMATOCRIT: 24.1 % — AB (ref 39.0–52.0)
HEMOGLOBIN: 7.6 g/dL — AB (ref 13.0–17.0)
MCH: 27.4 pg (ref 26.0–34.0)
MCHC: 31.5 g/dL (ref 30.0–36.0)
MCV: 87 fL (ref 78.0–100.0)
Platelets: 300 10*3/uL (ref 150–400)
RBC: 2.77 MIL/uL — ABNORMAL LOW (ref 4.22–5.81)
RDW: 20.7 % — ABNORMAL HIGH (ref 11.5–15.5)
WBC: 4.2 10*3/uL (ref 4.0–10.5)

## 2013-08-19 LAB — TYPE AND SCREEN
ABO/RH(D): O POS
ANTIBODY SCREEN: NEGATIVE

## 2013-08-19 LAB — TROPONIN I

## 2013-08-19 MED ORDER — SODIUM CHLORIDE 0.9 % IV BOLUS (SEPSIS)
500.0000 mL | Freq: Once | INTRAVENOUS | Status: AC
  Administered 2013-08-19: 500 mL via INTRAVENOUS

## 2013-08-19 MED ORDER — ASPIRIN 81 MG PO CHEW
162.0000 mg | CHEWABLE_TABLET | Freq: Once | ORAL | Status: AC
  Administered 2013-08-19: 162 mg via ORAL
  Filled 2013-08-19: qty 2

## 2013-08-19 MED ORDER — SODIUM POLYSTYRENE SULFONATE 15 GM/60ML PO SUSP
30.0000 g | Freq: Once | ORAL | Status: AC
  Administered 2013-08-19: 30 g via ORAL
  Filled 2013-08-19: qty 120

## 2013-08-19 MED ORDER — ISOSORB DINITRATE-HYDRALAZINE 20-37.5 MG PO TABS: 2.0000 | ORAL_TABLET | Freq: Two times a day (BID) | ORAL | Status: DC

## 2013-08-19 MED ORDER — FUROSEMIDE 40 MG PO TABS: 60.0000 mg | ORAL_TABLET | Freq: Every day | ORAL | Status: DC

## 2013-08-19 NOTE — Progress Notes (Signed)
CSW met with patient this morning when he asked to see a Education officer, museum. Patient will be d/c'd today to home- plans to return to prior living arrangement in [facility] but really wants to get a house in [place]. CSW has been talking with patient about the housing authority and once again provided him with the phone number to the [employer] and the [employer].  Patient agreed to follow up with both agencies this time as he has not done so in the past.  He states that he has a ride back home today and denies any further needs. CSW signing off.  Lorie Phenix. [place], [place]

## 2013-08-19 NOTE — Progress Notes (Signed)
Discharge teaching completed. Denies pain and discomfort.

## 2013-08-19 NOTE — Discharge Summary (Signed)
Physician Discharge Summary  Charles [name] QJJ:[id number] DOB: [birth year] DOA: 08/11/2013  PCP: [name]  Admit date: 08/11/2013 Discharge date: 08/19/2013  Time spent: 40 minutes  Recommendations for Outpatient Follow-up:  1. Follow up with Heart Failure clinic BNP    Component Value Date/Time   PROBNP 2998.0* 08/11/2013 0112   Filed Weights   08/17/13 0724 08/18/13 4818 08/19/13 5631  Weight: 77.7 kg (171 lb 4.8 oz) 77.656 kg (171 lb 3.2 oz) 76.48 kg (168 lb 9.7 oz)     Discharge Diagnoses:  Principal Problem:   Chest pain Active Problems:   Acute systolic congestive heart failure, NYHA class 2   Hypertension   Chronic systolic heart failure   CAD (coronary artery disease)   Ischemic cardiomyopathy   Chronic kidney disease (CKD), stage III (moderate)   Moderate to severe pulmonary hypertension   Seizure disorder   Discharge Condition: stable  Diet recommendation: low sodium   History of present illness:  68 y.o. male with Past medical history of hypertension, coronary artery disease, dyslipidemia, COPD, active smoker, substance abuse, sleep apnea.  The patient is presenting with complaints of shortness of breath. He mentions that she was at his place and at around 9 PM and he went to sleep when he woke from sleep with complaints of shortness of breath cough and chest pain. He had substernal chest pain which was radiating to his left arm and radiating to his jaw. The pain was not getting better and therefore he decided to come to the hospital but he continues to have shortness of breath. He mentions he has at his baseline shortness of breath on exertion. No shortness of breath on lying down.  He mentions he is compliant with his medication denies any substance abuse recently. He complains of nausea, vomiting, dizziness, cough without expectoration, leg pain.    Hospital Course:  Acute chronic systolic and diastolic congestive heart failure:  - due to  cocaine and crack abuse.  - Consulted cardiology, dry weight of 170 (76 kg).  - was diurese with IV lasix. - held lasix for today for mild AKI,  resume orally at home. - Monitor electrolytes replete as needed.  - Cont bidil. Insurance will cover.  - Increase coreg cont lisinopril.  - follow up with Heart failure clinic.  Chest pain  - Typical and atypical features,EKG did not reveal ischemic changes.  - Consulted cardiology recommended no further intervention, Myoview in December showed NO ischemia.  - Continue aspirin, ACE inhibitor,and statin therapy.   Hyperkalemia:  - Due to over diuresis and aldactone. Aldactone held. Was given kayexelate. - Will be resume as an outpatient. Once b-met recheck.  Hypertensive emergency  -Transitioned from nitroglycerin drip to PO Imdur 60 mg daily  -Blood pressures improved, cont BiDil.  - Cont amlodipine to 10 mg daily and continued home dose clonidine 0.2 mg BID, continued Lasix IV 60 mg daily and lisinopril 20 mg BID.   Cocaine abuse: - Patient counseled on cocaine abuse and its negative effects on his heart  - Admitted to smoking crack 1 week ago  - He stated to previous MD "I'm not going to do that anymore".   Recent gastric antrum AVM with bleeding /anemia  -current Hgb stable >8.5. - cont ferrous sulfate.   Procedures:  None  Consultations:  cardiology  Discharge Exam: Filed Vitals:   08/19/13 0810  BP: 155/74  Pulse: 80  Temp: 97.6 F (36.4 C)  Resp:     General:  A&O x3 Cardiovascular: RRR Respiratory: good air movement CTA B/L  Discharge Instructions      Discharge Instructions   Diet - low sodium heart healthy    Complete by:  As directed      Increase activity slowly    Complete by:  As directed             Medication List    STOP taking these medications       potassium chloride 10 MEQ tablet  Commonly known as:  K-DUR      TAKE these medications       amLODipine 5 MG tablet  Commonly known  as:  NORVASC  Take 1 tablet (5 mg total) by mouth daily.     aspirin EC 81 MG tablet  Take 1 tablet (81 mg total) by mouth daily.     carvedilol 3.125 MG tablet  Commonly known as:  COREG  Take 1 tablet (3.125 mg total) by mouth 2 (two) times daily with a meal.     cloNIDine 0.2 MG tablet  Commonly known as:  CATAPRES  Take 0.2 mg by mouth daily.     divalproex 500 MG DR tablet  Commonly known as:  DEPAKOTE  Take 1 tablet (500 mg total) by mouth 2 (two) times daily.     ferrous sulfate 325 (65 FE) MG tablet  Take 1 tablet (325 mg total) by mouth 2 (two) times daily with a meal.     furosemide 40 MG tablet  Commonly known as:  LASIX  Take 1.5 tablets (60 mg total) by mouth daily.  Start taking on:  08/20/2013     gabapentin 100 MG capsule  Commonly known as:  NEURONTIN  Take 1 capsule (100 mg total) by mouth 2 (two) times daily.     hydrALAZINE 100 MG tablet  Commonly known as:  APRESOLINE  Take 1 tablet (100 mg total) by mouth 3 (three) times daily.     isosorbide-hydrALAZINE 20-37.5 MG per tablet  Commonly known as:  BIDIL  Take 2 tablets by mouth 2 (two) times daily.     lisinopril 20 MG tablet  Commonly known as:  PRINIVIL,ZESTRIL  Take 1 tablet (20 mg total) by mouth 2 (two) times daily.     pantoprazole 40 MG tablet  Commonly known as:  PROTONIX  Take 1 tablet (40 mg total) by mouth 2 (two) times daily.     phenytoin 100 MG ER capsule  Commonly known as:  DILANTIN  Take 2-3 capsules (200-300 mg total) by mouth 2 (two) times daily. Take 2 capsules by mouth in the morning and 3 capsules by mouth at bedtime.     simvastatin 40 MG tablet  Commonly known as:  ZOCOR  Take 1 tablet (40 mg total) by mouth every evening.     spironolactone 50 MG tablet  Commonly known as:  ALDACTONE  Take 1 tablet (50 mg total) by mouth daily.     tiZANidine 2 MG tablet  Commonly known as:  ZANAFLEX  Take 2 mg by mouth daily.       Allergies  Allergen Reactions  . Motrin  [Ibuprofen] Other (See Comments)    Affects kidneys  . Tylenol [Acetaminophen] Other (See Comments)    Affects kidneys   Follow-up Information   Follow up with [facility] In 2 days. (follow up with Heart failure clinic)    Specialty:  Cardiology   Contact information:   [address] [id number] Danice Goltz [place] [address] [phone]       The results of significant diagnostics from this hospitalization (including imaging, microbiology, ancillary and laboratory) are listed below for reference.    Significant Diagnostic Studies: Dg Chest 2 View  08/14/2013   CLINICAL DATA:  Shortness of Breath  EXAM: CHEST  2 VIEW  COMPARISON:  Aug 11, 2013  FINDINGS: There is no edema or consolidation. Heart is borderline enlarged with pulmonary vascularity consistent with mild pulmonary venous hypertension. The patient is status post coronary artery bypass grafting. No adenopathy. No bone lesions.  IMPRESSION: Evidence of a degree of volume overload without edema or consolidation. No new opacity.   Electronically Signed   By: Lowella Grip M.D.   On: 08/14/2013 11:04   Dg Chest 2 View  08/11/2013   CLINICAL DATA:  Shortness of breath.  EXAM: CHEST  2 VIEW  COMPARISON:  Chest radiograph performed 08/02/2013  FINDINGS: The lungs are well-aerated. Vascular congestion is noted. Increased interstitial markings may reflect mild interstitial edema or possibly pneumonia. No pleural effusion or pneumothorax is seen.  The heart is enlarged. The patient is status post median sternotomy, with evidence of prior CABG. No acute osseous abnormalities are seen.  IMPRESSION: Vascular congestion and cardiomegaly. Increased interstitial markings may reflect mild interstitial edema or possibly pneumonia.   Electronically Signed   By: Garald Balding M.D.   On: 08/11/2013 01:57   Dg Chest Portable 1 View  08/02/2013   CLINICAL DATA:  Chest pain  EXAM: PORTABLE CHEST - 1 VIEW   COMPARISON:  07/15/2013  FINDINGS: Stable moderate cardiomegaly. Status post CABG. Mild pulmonary hyperinflation with chronic interstitial coarsening. No edema or consolidation. No effusion or pneumothorax.  IMPRESSION: Cardiomegaly and pulmonary venous congestion.   Electronically Signed   By: Jorje Guild M.D.   On: 08/02/2013 08:33    Microbiology: Recent Results (from the past 240 hour(s))  MRSA PCR SCREENING     Status: Abnormal   Collection Time    08/11/13  9:41 AM      Result Value Ref Range Status   MRSA by PCR POSITIVE (*) NEGATIVE Final   Comment:            The GeneXpert MRSA Assay (FDA     approved for NASAL specimens     only), is one component of a     comprehensive MRSA colonization     surveillance program. It is not     intended to diagnose MRSA     infection nor to guide or     monitor treatment for     MRSA infections.     RESULT CALLED TO, READ BACK BY AND VERIFIED WITH:     CARAHER RN 11:30 08/11/13 ([email or web address])     Labs: Basic Metabolic Panel:  Recent Labs Lab 08/16/13 0340 08/17/13 0443 08/18/13 0509 08/18/13 1205 08/19/13 0214  NA 136* 132* 136* 136* 133*  K 5.5* 5.3 5.5* 5.6* 5.2  CL 95* 91* 97 97 92*  CO2 _0 GLUCOSE 60* 141* 86 119* 149*  BUN 54* 61* 52* 46* 51*  CREATININE 1.97* 1.98* 1.72* 1.59* 1.84*  CALCIUM 9.0 9.1 8.9 8.8 8.6   Liver Function Tests: No results found for this basename: AST, ALT, ALKPHOS, BILITOT, PROT, ALBUMIN,  in the last 168 hours No results found for this basename: LIPASE, AMYLASE,  in the last 168 hours No results found for this basename: AMMONIA,  in the  last 168 hours CBC:  Recent Labs Lab 08/18/13 1249 08/19/13 0214  WBC 4.1 4.2  HGB 7.8* 7.6*  HCT 24.8* 24.1*  MCV 86.7 87.0  PLT 295 300   Cardiac Enzymes:  Recent Labs Lab 08/19/13 0214 08/19/13 0550  TROPONINI <0.30 <0.30   BNP: BNP (last 3 results)  Recent Labs  07/19/13 0430 08/02/13 0808 08/11/13 0112  PROBNP 981.3*  1930.0* 2998.0*   CBG:  Recent Labs Lab 08/16/13 0609  GLUCAP 113*       Signed:  Charlynne Cousins  [facility] 08/19/2013, 8:14 AM   **Disclaimer: This note may have been dictated with voice recognition software. Similar sounding words can inadvertently be transcribed and this note may contain transcription errors which may not have been corrected upon publication of note.**

## 2013-08-21 ENCOUNTER — Encounter (HOSPITAL_COMMUNITY): Payer: Medicare Other

## 2013-08-22 NOTE — Progress Notes (Signed)
Patient ID: Charles [name], male   DOB: [birth year], 68 y.o.   MRN: [id number]   Primary Physician: [name]  Primary Cardiologist: Dr. Marylou Mccoy [facility])   HPI: [name] is a 68 y.o. year old male with prior hx/o cocaine abuse, GI bleed, systolic CHF, CAD s/p CABG 2009, and stage 3 CKD, Myoview in December 2014 no ischemia and frequent hospitalizations due to acute HF.   Most recent admit Aug 11, 2013 with chest pain and dyspnea. He was diuresed with IV lasix . Discharge weight 168 pounds.   He returns for post hospital follow up. Mild dyspnea with exertion. No chest pain. Denies PND/Orthopnea. Weight at home 170 something. He is not sure what it is. He receives a pill pack from his pharmacy. Tries to follow low salt diet and limiting fl uid to < 2 liters per day.   ECHO EF 07/16/13 50-55% Grade 2 DD  Labs 08/02/13 +cocaine  Labs 08/19/13 K 5.2 Creatinine 1.84   SH: Lives with daughter in [place], drinks 2 40 oz cans beer a week, uses cocaine and smokes 2 cigs a day . Stopped using drugs  FH: No cardiac history    ROS: All systems negative except as listed in HPI, PMH and Problem List.  Past Medical History  Diagnosis Date  . Iron deficiency anemia   . Hypertension   . Chronic diastolic heart failure     a. 2D ECHO 2015 50-55% and G2DD  . CAD (coronary artery disease)     a. s/p 3-vessel CABG (12/2007) // 100% RCA stenosis with collaterals from left system. Severe bifurcation lesions of proxima CXA and OM. Moderate LAD diseaseFollow[facility]  . Hyperlipidemia LDL goal <70   . Obstructive sleep apnea     a. not on home CPAP  . Ischemic cardiomyopathy     a. EF now normalized (2012 EF 35-40%)  . Chronic kidney disease (CKD), stage III (moderate)     BL SCr 1.5-1.6  . Homelessness   . 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 Polysubstance abuse     a. cocaine, THC  . AV  malformation of gastrointestinal tract   . Family history of early CAD   . DDD (degenerative disc disease), lumbar   . Renal artery stenosis   . Peripheral vascular disease   . Seizure disorder   . Diabetes   . Acute on chronic systolic CHF (congestive heart failure) 08/13/2012  . Seizures     Current Outpatient Prescriptions  Medication Sig Dispense Refill  . amLODipine (NORVASC) 5 MG tablet Take 2.5 mg by mouth daily.      [name] aspirin EC 81 MG tablet Take 1 tablet (81 mg total) by mouth daily.      . citalopram (CELEXA) 20 MG tablet Take 20 mg by mouth daily.      . cloNIDine (CATAPRES) 0.2 MG tablet Take 0.2 mg by mouth daily.      [name] gabapentin (NEURONTIN) 100 MG capsule Take 1 capsule (100 mg total) by mouth 2 (two) times daily.  60 capsule  2  . pantoprazole (PROTONIX) 40 MG tablet Take 1 tablet (40 mg total) by mouth 2 (two) times daily.  60 tablet  0  . phenytoin (DILANTIN) 100 MG ER capsule Take 2-3 capsules (200-300 mg total) by mouth 2 (two) times daily. Take 2 capsules by mouth in the  morning and 3 capsules by mouth at bedtime.  90 capsule  2  . simvastatin (ZOCOR) 40 MG tablet Take 1 tablet (40 mg total) by mouth every evening.  30 tablet  1  . tiZANidine (ZANAFLEX) 2 MG tablet Take 2 mg by mouth daily.        No current facility-administered medications for this encounter.     PHYSICAL EXAM: Filed Vitals:   08/25/13 1500  BP: 180/90  Pulse: 78  Weight: 174 lb (78.926 kg)  SpO2: 97%    General:  Well appearing. No resp difficulty HEENT: normal Neck: supple. JVP to jaw. Carotids 2+ bilaterally; no bruits. No lymphadenopathy or thryomegaly appreciated. Cor: PMI normal. Regular rate & rhythm. No rubs, gallops or murmurs. Lungs: clear Abdomen: soft, nontender, ++diistended. No hepatosplenomegaly. No bruits or masses. Good bowel sounds. Extremities: no cyanosis, clubbing, rash, edema Neuro: alert & orientedx3, cranial nerves grossly intact. Moves all 4 extremities w/o  difficulty. Affect pleasant.      ASSESSMENT & PLAN: I compared discharge summary medications to what his pharmacy prepares for him. I contacted his pharmacy to verify what he is taking. He receives bubble packs of medications. He is taking 2.5 mg amlodipine daily, aspirin 81 mg dialy, clonidine 0.2 mg daily, and 40 mg simvastatin daily.   1. Chronic Diastolic Heart Fialure ICM EF 50-55% Grade II DD.  Volume status elevated. Weight up 6 pounds from discharge. Today I am going to focus on his volume status as I am not sure he has the insight to understand med changes.  Increase lasix to 80 mg daily. Would like his weight < 170 pounds.  Check BMET and pro bnp today.  I reinforced daily weighs and provided a weight chart, low salt food choices, and medication compliance.  Refer to [facility] for assistance with HF and medication management.  2. Cocaine Abuse- last used in May 2015 . I have advised he needs remain off drugs.  3. HTN-Elevated.  Increase amlodipine to 5 mg daily. Continue 0.2 mg twice a day. Increase lasix to 80 mg daily.  4. Anemia- H/O AVMS  5. Hyperkalemia - No potassium Keep off ace.  6. CAD  CABG x s3 2009 on 81 mg aspirin daily, and simvastatin 40 mg daily. Will need to restart BB at next visit although with cocaine abuse that may need to be avoided.   Follow up next week to restart additional  meds and meet with HF SW. Check BMEt next week.   Jacques Fife D Corian Handley NP-C  4:05 PM

## 2013-08-25 ENCOUNTER — Encounter (HOSPITAL_COMMUNITY): Payer: Medicare Other

## 2013-08-25 ENCOUNTER — Ambulatory Visit (HOSPITAL_COMMUNITY)
Admit: 2013-08-25 | Discharge: 2013-08-25 | Disposition: A | Discharge status: Home / Self Care | Payer: Medicare Other | Attending: Internal Medicine | Admitting: Internal Medicine

## 2013-08-25 VITALS — BP 180/90 | HR 78 | Wt 174.0 lb

## 2013-08-25 DIAGNOSIS — I509 Heart failure, unspecified: Secondary | ICD-10-CM | POA: Diagnosis not present

## 2013-08-25 DIAGNOSIS — I2789 Other specified pulmonary heart diseases: Secondary | ICD-10-CM | POA: Diagnosis not present

## 2013-08-25 DIAGNOSIS — Z951 Presence of aortocoronary bypass graft: Secondary | ICD-10-CM | POA: Insufficient documentation

## 2013-08-25 DIAGNOSIS — F17200 Nicotine dependence, unspecified, uncomplicated: Secondary | ICD-10-CM | POA: Diagnosis not present

## 2013-08-25 DIAGNOSIS — F1410 Cocaine abuse, uncomplicated: Secondary | ICD-10-CM | POA: Diagnosis not present

## 2013-08-25 DIAGNOSIS — D509 Iron deficiency anemia, unspecified: Secondary | ICD-10-CM | POA: Diagnosis not present

## 2013-08-25 DIAGNOSIS — Z5900 Homelessness unspecified: Secondary | ICD-10-CM | POA: Insufficient documentation

## 2013-08-25 DIAGNOSIS — I5022 Chronic systolic (congestive) heart failure: Secondary | ICD-10-CM

## 2013-08-25 DIAGNOSIS — E119 Type 2 diabetes mellitus without complications: Secondary | ICD-10-CM | POA: Diagnosis not present

## 2013-08-25 DIAGNOSIS — Q2733 Arteriovenous malformation of digestive system vessel: Secondary | ICD-10-CM | POA: Insufficient documentation

## 2013-08-25 DIAGNOSIS — Z7982 Long term (current) use of aspirin: Secondary | ICD-10-CM | POA: Insufficient documentation

## 2013-08-25 DIAGNOSIS — I701 Atherosclerosis of renal artery: Secondary | ICD-10-CM | POA: Insufficient documentation

## 2013-08-25 DIAGNOSIS — M5137 Other intervertebral disc degeneration, lumbosacral region: Secondary | ICD-10-CM | POA: Diagnosis not present

## 2013-08-25 DIAGNOSIS — E875 Hyperkalemia: Secondary | ICD-10-CM | POA: Insufficient documentation

## 2013-08-25 DIAGNOSIS — M51379 Other intervertebral disc degeneration, lumbosacral region without mention of lumbar back pain or lower extremity pain: Secondary | ICD-10-CM | POA: Insufficient documentation

## 2013-08-25 DIAGNOSIS — E785 Hyperlipidemia, unspecified: Secondary | ICD-10-CM | POA: Insufficient documentation

## 2013-08-25 DIAGNOSIS — I2589 Other forms of chronic ischemic heart disease: Secondary | ICD-10-CM | POA: Diagnosis not present

## 2013-08-25 DIAGNOSIS — R569 Unspecified convulsions: Secondary | ICD-10-CM | POA: Insufficient documentation

## 2013-08-25 DIAGNOSIS — N183 Chronic kidney disease, stage 3 unspecified: Secondary | ICD-10-CM | POA: Diagnosis not present

## 2013-08-25 DIAGNOSIS — I739 Peripheral vascular disease, unspecified: Secondary | ICD-10-CM | POA: Diagnosis not present

## 2013-08-25 DIAGNOSIS — I5032 Chronic diastolic (congestive) heart failure: Secondary | ICD-10-CM

## 2013-08-25 DIAGNOSIS — I2510 Atherosclerotic heart disease of native coronary artery without angina pectoris: Secondary | ICD-10-CM

## 2013-08-25 DIAGNOSIS — Z79899 Other long term (current) drug therapy: Secondary | ICD-10-CM | POA: Diagnosis not present

## 2013-08-25 DIAGNOSIS — I5043 Acute on chronic combined systolic (congestive) and diastolic (congestive) heart failure: Secondary | ICD-10-CM | POA: Diagnosis not present

## 2013-08-25 DIAGNOSIS — F1010 Alcohol abuse, uncomplicated: Secondary | ICD-10-CM | POA: Diagnosis not present

## 2013-08-25 DIAGNOSIS — G4733 Obstructive sleep apnea (adult) (pediatric): Secondary | ICD-10-CM | POA: Diagnosis not present

## 2013-08-25 DIAGNOSIS — Z8249 Family history of ischemic heart disease and other diseases of the circulatory system: Secondary | ICD-10-CM | POA: Diagnosis not present

## 2013-08-25 DIAGNOSIS — I5021 Acute systolic (congestive) heart failure: Secondary | ICD-10-CM | POA: Diagnosis present

## 2013-08-25 DIAGNOSIS — F1910 Other psychoactive substance abuse, uncomplicated: Secondary | ICD-10-CM | POA: Diagnosis not present

## 2013-08-25 LAB — BASIC METABOLIC PANEL
BUN: 24 mg/dL — ABNORMAL HIGH (ref 6–23)
CHLORIDE: 104 meq/L (ref 96–112)
CO2: 21 mEq/L (ref 19–32)
Calcium: 8.9 mg/dL (ref 8.4–10.5)
Creatinine, Ser: 1.24 mg/dL (ref 0.50–1.35)
GFR calc non Af Amer: 58 mL/min — ABNORMAL LOW (ref >90)
GFR, EST AFRICAN AMERICAN: 67 mL/min — AB (ref >90)
Glucose, Bld: 82 mg/dL (ref 70–99)
POTASSIUM: 5 meq/L (ref 3.7–5.3)
Sodium: 138 mEq/L (ref 137–147)

## 2013-08-25 LAB — PRO B NATRIURETIC PEPTIDE: Pro B Natriuretic peptide (BNP): 3274 pg/mL — ABNORMAL HIGH (ref 0–125)

## 2013-08-25 MED ORDER — AMLODIPINE BESYLATE 5 MG PO TABS: 5.0000 mg | ORAL_TABLET | Freq: Every day | ORAL | Status: DC

## 2013-08-25 MED ORDER — FUROSEMIDE 80 MG PO TABS
80.0000 mg | ORAL_TABLET | Freq: Every day | ORAL | Status: DC
Start: 2013-08-25 — End: 2013-10-18

## 2013-08-25 NOTE — Patient Instructions (Signed)
Follow up next week  Take lasix 80 mg daily  Take norvasc 5 mg daily  Do the following things EVERYDAY: 1) Weigh yourself in the morning before breakfast. Write it down and keep it in a log. 2) Take your medicines as prescribed 3) Eat low salt foods-Limit salt (sodium) to 2000 mg per day.  4) Stay as active as you can everyday 5) Limit all fluids for the day to less than 2 liters

## 2013-08-26 ENCOUNTER — Telehealth: Payer: Self-pay | Admitting: Licensed Clinical Social Worker

## 2013-08-26 NOTE — Telephone Encounter (Signed)
CSW referred to assist with housing options. CSW attempted to reach patient although mailbox full and unable to leave a message. CSW will continue to attempt contact. Raquel Sarna, [place]

## 2013-08-28 ENCOUNTER — Telehealth: Payer: Self-pay | Admitting: Licensed Clinical Social Worker

## 2013-08-28 NOTE — Telephone Encounter (Signed)
CSW received return call from patient requesting information on housing programs. CSW discussed at length the process for affordable housing and applications. CSW provided numbers for follow up of [employer] and [employer]. Patient verbalizes understanding of needed follow up and will contact CSW if further needs arise. Raquel Sarna, [place]

## 2013-09-01 ENCOUNTER — Encounter (HOSPITAL_COMMUNITY): Payer: Medicare Other

## 2013-09-02 ENCOUNTER — Encounter (HOSPITAL_COMMUNITY): Payer: Self-pay

## 2013-09-03 ENCOUNTER — Ambulatory Visit (HOSPITAL_COMMUNITY)
Admission: RE | Admit: 2013-09-03 | Discharge: 2013-09-03 | Disposition: A | Discharge status: Home / Self Care | Payer: Medicare Other | Source: Ambulatory Visit | Attending: Internal Medicine | Admitting: Internal Medicine

## 2013-09-03 ENCOUNTER — Encounter (HOSPITAL_COMMUNITY): Payer: Self-pay

## 2013-09-15 ENCOUNTER — Other Ambulatory Visit (HOSPITAL_COMMUNITY): Payer: Self-pay | Admitting: *Deleted

## 2013-10-15 ENCOUNTER — Inpatient Hospital Stay (HOSPITAL_COMMUNITY)
Admission: EM | Admit: 2013-10-15 | Discharge: 2013-10-18 | DRG: 291 | Disposition: A | Discharge status: Home / Self Care | Payer: Medicare Other | Attending: Internal Medicine | Admitting: Internal Medicine

## 2013-10-15 ENCOUNTER — Emergency Department (HOSPITAL_COMMUNITY): Payer: Medicare Other

## 2013-10-15 ENCOUNTER — Encounter (HOSPITAL_COMMUNITY): Payer: Self-pay | Admitting: Emergency Medicine

## 2013-10-15 DIAGNOSIS — J4489 Other specified chronic obstructive pulmonary disease: Secondary | ICD-10-CM | POA: Diagnosis present

## 2013-10-15 DIAGNOSIS — I10 Essential (primary) hypertension: Secondary | ICD-10-CM

## 2013-10-15 DIAGNOSIS — J449 Chronic obstructive pulmonary disease, unspecified: Secondary | ICD-10-CM | POA: Diagnosis present

## 2013-10-15 DIAGNOSIS — R058 Other specified cough: Secondary | ICD-10-CM

## 2013-10-15 DIAGNOSIS — R071 Chest pain on breathing: Secondary | ICD-10-CM | POA: Diagnosis present

## 2013-10-15 DIAGNOSIS — E785 Hyperlipidemia, unspecified: Secondary | ICD-10-CM | POA: Diagnosis present

## 2013-10-15 DIAGNOSIS — G40909 Epilepsy, unspecified, not intractable, without status epilepticus: Secondary | ICD-10-CM | POA: Diagnosis present

## 2013-10-15 DIAGNOSIS — Z7982 Long term (current) use of aspirin: Secondary | ICD-10-CM | POA: Diagnosis not present

## 2013-10-15 DIAGNOSIS — N183 Chronic kidney disease, stage 3 unspecified: Secondary | ICD-10-CM | POA: Diagnosis present

## 2013-10-15 DIAGNOSIS — Z951 Presence of aortocoronary bypass graft: Secondary | ICD-10-CM | POA: Diagnosis not present

## 2013-10-15 DIAGNOSIS — D509 Iron deficiency anemia, unspecified: Secondary | ICD-10-CM | POA: Diagnosis present

## 2013-10-15 DIAGNOSIS — I2589 Other forms of chronic ischemic heart disease: Secondary | ICD-10-CM | POA: Diagnosis present

## 2013-10-15 DIAGNOSIS — R05 Cough: Secondary | ICD-10-CM | POA: Diagnosis present

## 2013-10-15 DIAGNOSIS — R059 Cough, unspecified: Secondary | ICD-10-CM | POA: Diagnosis present

## 2013-10-15 DIAGNOSIS — I5033 Acute on chronic diastolic (congestive) heart failure: Secondary | ICD-10-CM

## 2013-10-15 DIAGNOSIS — I2789 Other specified pulmonary heart diseases: Secondary | ICD-10-CM | POA: Diagnosis present

## 2013-10-15 DIAGNOSIS — N189 Chronic kidney disease, unspecified: Secondary | ICD-10-CM | POA: Diagnosis not present

## 2013-10-15 DIAGNOSIS — I509 Heart failure, unspecified: Secondary | ICD-10-CM | POA: Diagnosis present

## 2013-10-15 DIAGNOSIS — I201 Angina pectoris with documented spasm: Secondary | ICD-10-CM

## 2013-10-15 DIAGNOSIS — F1210 Cannabis abuse, uncomplicated: Secondary | ICD-10-CM | POA: Diagnosis present

## 2013-10-15 DIAGNOSIS — G4733 Obstructive sleep apnea (adult) (pediatric): Secondary | ICD-10-CM | POA: Diagnosis present

## 2013-10-15 DIAGNOSIS — I739 Peripheral vascular disease, unspecified: Secondary | ICD-10-CM

## 2013-10-15 DIAGNOSIS — Z8249 Family history of ischemic heart disease and other diseases of the circulatory system: Secondary | ICD-10-CM | POA: Diagnosis not present

## 2013-10-15 DIAGNOSIS — I5043 Acute on chronic combined systolic (congestive) and diastolic (congestive) heart failure: Secondary | ICD-10-CM | POA: Diagnosis present

## 2013-10-15 DIAGNOSIS — R053 Chronic cough: Secondary | ICD-10-CM

## 2013-10-15 DIAGNOSIS — I517 Cardiomegaly: Secondary | ICD-10-CM | POA: Diagnosis present

## 2013-10-15 DIAGNOSIS — I2510 Atherosclerotic heart disease of native coronary artery without angina pectoris: Secondary | ICD-10-CM | POA: Diagnosis present

## 2013-10-15 DIAGNOSIS — I130 Hypertensive heart and chronic kidney disease with heart failure and stage 1 through stage 4 chronic kidney disease, or unspecified chronic kidney disease: Secondary | ICD-10-CM | POA: Diagnosis present

## 2013-10-15 DIAGNOSIS — F1410 Cocaine abuse, uncomplicated: Secondary | ICD-10-CM | POA: Diagnosis present

## 2013-10-15 DIAGNOSIS — Z9119 Patient's noncompliance with other medical treatment and regimen: Secondary | ICD-10-CM | POA: Diagnosis not present

## 2013-10-15 DIAGNOSIS — R42 Dizziness and giddiness: Secondary | ICD-10-CM | POA: Diagnosis present

## 2013-10-15 DIAGNOSIS — Z91199 Patient's noncompliance with other medical treatment and regimen due to unspecified reason: Secondary | ICD-10-CM | POA: Diagnosis not present

## 2013-10-15 DIAGNOSIS — R079 Chest pain, unspecified: Secondary | ICD-10-CM

## 2013-10-15 LAB — RAPID URINE DRUG SCREEN, HOSP PERFORMED
AMPHETAMINES: NOT DETECTED
Barbiturates: NOT DETECTED
Benzodiazepines: NOT DETECTED
Cocaine: POSITIVE — AB
Opiates: NOT DETECTED
Tetrahydrocannabinol: NOT DETECTED

## 2013-10-15 LAB — BASIC METABOLIC PANEL
ANION GAP: 14 (ref 5–15)
BUN: 24 mg/dL — ABNORMAL HIGH (ref 6–23)
CHLORIDE: 103 meq/L (ref 96–112)
CO2: 20 mEq/L (ref 19–32)
CREATININE: 1.39 mg/dL — AB (ref 0.50–1.35)
Calcium: 8.4 mg/dL (ref 8.4–10.5)
GFR calc non Af Amer: 50 mL/min — ABNORMAL LOW (ref >90)
GFR, EST AFRICAN AMERICAN: 58 mL/min — AB (ref >90)
Glucose, Bld: 96 mg/dL (ref 70–99)
POTASSIUM: 4.6 meq/L (ref 3.7–5.3)
Sodium: 137 mEq/L (ref 137–147)

## 2013-10-15 LAB — CBC
HCT: 25.6 % — ABNORMAL LOW (ref 39.0–52.0)
HCT: 25.5 % — ABNORMAL LOW (ref 39.0–52.0)
Hemoglobin: 7.9 g/dL — ABNORMAL LOW (ref 13.0–17.0)
Hemoglobin: 8 g/dL — ABNORMAL LOW (ref 13.0–17.0)
MCH: 26.9 pg (ref 26.0–34.0)
MCH: 27.5 pg (ref 26.0–34.0)
MCHC: 30.9 g/dL (ref 30.0–36.0)
MCHC: 31.4 g/dL (ref 30.0–36.0)
MCV: 87.1 fL (ref 78.0–100.0)
MCV: 87.6 fL (ref 78.0–100.0)
PLATELETS: 284 10*3/uL (ref 150–400)
Platelets: 290 10*3/uL (ref 150–400)
RBC: 2.94 MIL/uL — ABNORMAL LOW (ref 4.22–5.81)
RBC: 2.91 MIL/uL — ABNORMAL LOW (ref 4.22–5.81)
RDW: 15.3 % (ref 11.5–15.5)
RDW: 15.3 % (ref 11.5–15.5)
WBC: 4.7 10*3/uL (ref 4.0–10.5)
WBC: 4 10*3/uL (ref 4.0–10.5)

## 2013-10-15 LAB — LIPID PANEL
CHOL/HDL RATIO: 2.8 ratio
CHOLESTEROL: 88 mg/dL (ref 0–200)
HDL: 32 mg/dL — ABNORMAL LOW (ref >39)
LDL Cholesterol: 42 mg/dL (ref 0–99)
Triglycerides: 69 mg/dL (ref <150)
VLDL: 14 mg/dL (ref 0–40)

## 2013-10-15 LAB — CREATININE, SERUM
Creatinine, Ser: 1.36 mg/dL — ABNORMAL HIGH (ref 0.50–1.35)
GFR calc Af Amer: 60 mL/min — ABNORMAL LOW (ref >90)
GFR calc non Af Amer: 52 mL/min — ABNORMAL LOW (ref >90)

## 2013-10-15 LAB — MAGNESIUM: Magnesium: 1.9 mg/dL (ref 1.5–2.5)

## 2013-10-15 LAB — TROPONIN I
Troponin I: <0.3 ng/mL (ref <0.30)
Troponin I: <0.3 ng/mL (ref <0.30)

## 2013-10-15 LAB — I-STAT TROPONIN, ED: Troponin i, poc: 0.04 ng/mL (ref 0.00–0.08)

## 2013-10-15 LAB — PRO B NATRIURETIC PEPTIDE: Pro B Natriuretic peptide (BNP): 1973 pg/mL — ABNORMAL HIGH (ref 0–125)

## 2013-10-15 LAB — MRSA PCR SCREENING: MRSA by PCR: POSITIVE — AB

## 2013-10-15 MED ORDER — PHENYTOIN SODIUM EXTENDED 100 MG PO CAPS
200.0000 mg | ORAL_CAPSULE | Freq: Every day | ORAL | Status: DC
  Administered 2013-10-16 – 2013-10-18 (×3): 200 mg via ORAL
  Filled 2013-10-15 (×3): qty 2

## 2013-10-15 MED ORDER — NITROGLYCERIN 2 % TD OINT
1.0000 [in_us] | TOPICAL_OINTMENT | Freq: Four times a day (QID) | TRANSDERMAL | Status: DC
  Administered 2013-10-15: 1 [in_us] via TOPICAL
  Filled 2013-10-15: qty 1

## 2013-10-15 MED ORDER — MORPHINE SULFATE 2 MG/ML IJ SOLN
2.0000 mg | Freq: Once | INTRAMUSCULAR | Status: AC
  Administered 2013-10-15: 2 mg via INTRAVENOUS
  Filled 2013-10-15: qty 1

## 2013-10-15 MED ORDER — HEPARIN SODIUM (PORCINE) 5000 UNIT/ML IJ SOLN
5000.0000 [IU] | Freq: Three times a day (TID) | INTRAMUSCULAR | Status: DC
  Administered 2013-10-15 – 2013-10-18 (×9): 5000 [IU] via SUBCUTANEOUS
  Filled 2013-10-15 (×11): qty 1

## 2013-10-15 MED ORDER — ASPIRIN 81 MG PO CHEW
324.0000 mg | CHEWABLE_TABLET | Freq: Once | ORAL | Status: AC
  Administered 2013-10-15: 324 mg via ORAL
  Filled 2013-10-15: qty 4

## 2013-10-15 MED ORDER — NITROGLYCERIN IN D5W 200-5 MCG/ML-% IV SOLN
3.0000 ug/min | INTRAVENOUS | Status: DC
  Filled 2013-10-15: qty 250

## 2013-10-15 MED ORDER — MUPIROCIN 2 % EX OINT
1.0000 "application " | TOPICAL_OINTMENT | Freq: Two times a day (BID) | CUTANEOUS | Status: DC
  Administered 2013-10-15 – 2013-10-18 (×6): 1 via NASAL
  Filled 2013-10-15 (×2): qty 22

## 2013-10-15 MED ORDER — FERROUS SULFATE 325 (65 FE) MG PO TABS
325.0000 mg | ORAL_TABLET | Freq: Three times a day (TID) | ORAL | Status: DC
  Administered 2013-10-15 – 2013-10-18 (×9): 325 mg via ORAL
  Filled 2013-10-15 (×12): qty 1

## 2013-10-15 MED ORDER — PHENYTOIN SODIUM EXTENDED 100 MG PO CAPS
200.0000 mg | ORAL_CAPSULE | Freq: Two times a day (BID) | ORAL | Status: DC
  Administered 2013-10-15: 200 mg via ORAL
  Filled 2013-10-15: qty 2
  Filled 2013-10-15: qty 3

## 2013-10-15 MED ORDER — FUROSEMIDE 10 MG/ML IJ SOLN
80.0000 mg | Freq: Once | INTRAMUSCULAR | Status: AC
  Administered 2013-10-15: 80 mg via INTRAVENOUS
  Filled 2013-10-15: qty 8

## 2013-10-15 MED ORDER — CHLORHEXIDINE GLUCONATE CLOTH 2 % EX PADS
6.0000 | MEDICATED_PAD | Freq: Every day | CUTANEOUS | Status: DC
  Administered 2013-10-16 – 2013-10-18 (×3): 6 via TOPICAL

## 2013-10-15 MED ORDER — PHENYTOIN SODIUM EXTENDED 100 MG PO CAPS
300.0000 mg | ORAL_CAPSULE | Freq: Every day | ORAL | Status: DC
  Administered 2013-10-15 – 2013-10-17 (×3): 300 mg via ORAL
  Filled 2013-10-15 (×4): qty 3

## 2013-10-15 MED ORDER — FUROSEMIDE 10 MG/ML IJ SOLN: 80.0000 mg | Freq: Two times a day (BID) | INTRAMUSCULAR | Status: DC

## 2013-10-15 MED ORDER — CITALOPRAM HYDROBROMIDE 20 MG PO TABS
20.0000 mg | ORAL_TABLET | Freq: Every day | ORAL | Status: DC
  Administered 2013-10-15 – 2013-10-18 (×4): 20 mg via ORAL
  Filled 2013-10-15 (×2): qty 1
  Filled 2013-10-15: qty 2
  Filled 2013-10-15: qty 1

## 2013-10-15 MED ORDER — IPRATROPIUM-ALBUTEROL 0.5-2.5 (3) MG/3ML IN SOLN
3.0000 mL | Freq: Once | RESPIRATORY_TRACT | Status: AC
  Administered 2013-10-15: 3 mL via RESPIRATORY_TRACT
  Filled 2013-10-15: qty 3

## 2013-10-15 MED ORDER — FUROSEMIDE 10 MG/ML IJ SOLN
80.0000 mg | Freq: Two times a day (BID) | INTRAMUSCULAR | Status: DC
  Administered 2013-10-15 – 2013-10-16 (×2): 80 mg via INTRAVENOUS
  Filled 2013-10-15 (×3): qty 8

## 2013-10-15 MED ORDER — MORPHINE SULFATE 4 MG/ML IJ SOLN
4.0000 mg | Freq: Once | INTRAMUSCULAR | Status: AC
  Administered 2013-10-15: 4 mg via INTRAVENOUS
  Filled 2013-10-15: qty 1

## 2013-10-15 MED ORDER — GABAPENTIN 100 MG PO CAPS
100.0000 mg | ORAL_CAPSULE | Freq: Two times a day (BID) | ORAL | Status: DC
  Administered 2013-10-15 – 2013-10-18 (×7): 100 mg via ORAL
  Filled 2013-10-15 (×8): qty 1

## 2013-10-15 MED ORDER — AMLODIPINE BESYLATE 5 MG PO TABS
5.0000 mg | ORAL_TABLET | Freq: Every day | ORAL | Status: DC
  Administered 2013-10-15 – 2013-10-17 (×3): 5 mg via ORAL
  Filled 2013-10-15 (×3): qty 1

## 2013-10-15 MED ORDER — CLONIDINE HCL 0.2 MG PO TABS
0.2000 mg | ORAL_TABLET | Freq: Every day | ORAL | Status: DC
  Administered 2013-10-15 – 2013-10-16 (×2): 0.2 mg via ORAL
  Filled 2013-10-15 (×3): qty 1

## 2013-10-15 MED ORDER — PANTOPRAZOLE SODIUM 40 MG PO TBEC
40.0000 mg | DELAYED_RELEASE_TABLET | Freq: Two times a day (BID) | ORAL | Status: DC
  Administered 2013-10-15 – 2013-10-18 (×7): 40 mg via ORAL
  Filled 2013-10-15 (×7): qty 1

## 2013-10-15 MED ORDER — TIZANIDINE HCL 2 MG PO TABS
2.0000 mg | ORAL_TABLET | Freq: Every day | ORAL | Status: DC
  Filled 2013-10-15: qty 1

## 2013-10-15 MED ORDER — NITROGLYCERIN IN D5W 200-5 MCG/ML-% IV SOLN
2.0000 ug/min | Freq: Once | INTRAVENOUS | Status: AC
  Administered 2013-10-15: 5 ug/min via INTRAVENOUS
  Filled 2013-10-15: qty 250

## 2013-10-15 MED ORDER — SIMVASTATIN 40 MG PO TABS
40.0000 mg | ORAL_TABLET | Freq: Every evening | ORAL | Status: DC
  Administered 2013-10-15 – 2013-10-17 (×3): 40 mg via ORAL
  Filled 2013-10-15 (×4): qty 1

## 2013-10-15 MED ORDER — ASPIRIN EC 81 MG PO TBEC
81.0000 mg | DELAYED_RELEASE_TABLET | Freq: Every day | ORAL | Status: DC
  Administered 2013-10-15 – 2013-10-18 (×4): 81 mg via ORAL
  Filled 2013-10-15 (×5): qty 1

## 2013-10-15 NOTE — ED Notes (Signed)
NITRO PASTE REMOVED. NITRO DRIP STARTED.

## 2013-10-15 NOTE — ED Notes (Signed)
Meal tray provided for patient, with pasta. Reports this gives him heartburn. Secretary calling to find out what other options are available for heart healthy.

## 2013-10-15 NOTE — ED Notes (Signed)
Cardiology at bedside.

## 2013-10-15 NOTE — Consult Note (Addendum)
CARDIOLOGY CONSULT NOTE   Patient ID: Charles [name] MRN: [id number], DOB/AGE: Mar 22, 1945   Admit date: 10/15/2013 Date of Consult: 10/15/2013   Primary Physician: [name] Primary Cardiologist: Dr. Haroldine Laws  Pt. Profile  68 year old gentleman being admitted for increasing shortness of breath and chest pain.  He also has significant anemia.  Problem List  Past Medical History  Diagnosis Date  . Iron deficiency anemia   . Hypertension   . Chronic diastolic heart failure     a. 2D ECHO 2015 50-55% and G2DD  . CAD (coronary artery disease)     a. s/p 3-vessel CABG (12/2007) // 100% RCA stenosis with collaterals from left system. Severe bifurcation lesions of proxima CXA and OM. Moderate LAD diseaseFollowed by Dr. Wyline Copas in [facility]  . Hyperlipidemia LDL goal <70   . Obstructive sleep apnea     a. not on home CPAP  . Ischemic cardiomyopathy     a. EF now normalized (2012 EF 35-40%)  . Chronic kidney disease (CKD), stage III (moderate)     BL SCr 1.5-1.6  . Homelessness   . 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 Polysubstance abuse     a. cocaine, THC  . AV malformation of gastrointestinal tract   . Family history of early CAD   . DDD (degenerative disc disease), lumbar   . Renal artery stenosis   . Peripheral vascular disease   . Seizure disorder   . Diabetes   . Seizures     Past Surgical History  Procedure Laterality Date  . Coronary artery bypass graft  12/2007  . Apc  03/2010    To treat small bowel AVMs  . Esophagogastroduodenoscopy N/A 08/14/2012    Procedure: ESOPHAGOGASTRODUODENOSCOPY (EGD);  Surgeon: [name], MD;  Location: [facility] ENDOSCOPY;  Service: Endoscopy;  Laterality: N/A;  . Hot hemostasis N/A 08/14/2012    Procedure: HOT HEMOSTASIS (ARGON PLASMA COAGULATION/BICAP);  Surgeon: [name], MD;  Location: [facility] ENDOSCOPY;  Service: Endoscopy;  Laterality: N/A;  .  Esophagogastroduodenoscopy (egd) with propofol N/A 08/04/2013    Procedure: ESOPHAGOGASTRODUODENOSCOPY (EGD) WITH PROPOFOL;  Surgeon: [name], MD;  Location: [facility] ENDOSCOPY;  Service: Endoscopy;  Laterality: N/A;     Allergies  Allergies  Allergen Reactions  . Motrin [Ibuprofen] Other (See Comments)    Affects kidneys  . Tylenol [Acetaminophen] Other (See Comments)    Affects kidneys    HPI   This 68 year old gentleman presents to the emergency room with worsening chest discomfort and shortness of breath which began at about 5 AM today.  He has a past history of cocaine abuse, GI bleed, combined systolic and diastolic heart failure, and known coronary artery disease status post CABG in 2009.  He also has a past history of chronic kidney disease he has a history of significant anemia.  His last hospitalization was in May 2015. Today he with precordial chest discomfort and worsening dyspnea.  He was brought to the emergency room where his initial chest x-ray shows significant CHF.  His initial point-of-care troponin is normal.  His proBNP is elevated at 1973 which is lower than it was a recent followup office visit on 08/25/13 when seen in the heart failure clinic and his proBNP at that time was 3274.  His hemoglobin today is 7.9.  He has a past history of GI bleeding.  He has not been aware of any dark tarry stools  recently.  He states that he last used cocaine on Sunday night July 26.  Urine drug screen is pending.  Inpatient Medications  . nitroGLYCERIN  1 inch Topical 4 times per day    Family History Family History  Problem Relation Age of Onset  . Heart disease Mother     unknown type  . Heart disease Father 64    died of MI at 61yo  . Heart disease Paternal Grandfather 67    died of MI  . Heart disease    . Heart disease Brother 58     Social History History   Social History  . Marital Status: Single    Spouse Name: N/A    Number of Children: 2  . Years of  Education: N/A   Occupational History  . Unemployed     previously worked in [employer]  .     Social History Main Topics  . Smoking status: Current Some Day Smoker -- 0.20 packs/day for 13 years    Types: Cigarettes    Last Attempt to Quit: 03/20/1997  . Smokeless tobacco: Never Used  . Alcohol Use: Yes     Comment: occasionally drinks, a few times a month  . Drug Use: 1.00 per week    Special: Cocaine, Marijuana  . Sexual Activity: No   Other Topics Concern  . Not on file   Social History Narrative   Lives in [place]. Originally from [place], has been in the [place] since 1980s                    Review of Systems  General:  No chills, fever, night sweats or weight changes.  Cardiovascular:  No chest pain, dyspnea on exertion, edema, orthopnea, palpitations, paroxysmal nocturnal dyspnea. Dermatological: No rash, lesions/masses Respiratory: No cough, dyspnea Urologic: No hematuria, dysuria Abdominal:   No nausea, vomiting, diarrhea, bright red blood per rectum, melena, or hematemesis Neurologic:  No visual changes, wkns, changes in mental status. All other systems reviewed and are otherwise negative except as noted above.  Physical Exam  Blood pressure 177/93, pulse 70, temperature 98.2 F (36.8 C), temperature source Oral, resp. rate 32, SpO2 95.00%.  General: Pleasant, NAD.  Mild dyspnea but not in any acute distress. Psych: Normal affect. Neuro: Alert and oriented X 3. Moves all extremities spontaneously. HEENT: Normal  Neck: Supple without bruits.  Jugular venous pressure is elevated. Lungs:  The chest reveals bilateral rales Heart: RRR no s3, s4, or murmurs. Abdomen: Soft, non-tender, non-distended, BS + x 4.  Extremities: Mild peripheral edema.  Labs  No results found for this basename: CKTOTAL, CKMB, TROPONINI,  in the last 72 hours Lab Results  Component Value Date   WBC 4.7 10/15/2013   HGB 7.9* 10/15/2013   HCT 25.6* 10/15/2013    MCV 87.1 10/15/2013   PLT 290 10/15/2013     Recent Labs Lab 10/15/13 0654  NA 137  K 4.6  CL 103  CO2 20  BUN 24*  CREATININE 1.39*  CALCIUM 8.4  GLUCOSE 96   Lab Results  Component Value Date   CHOL 108 06/14/2012   HDL 43 06/14/2012   LDLCALC 48 06/14/2012   TRIG 85 06/14/2012   Lab Results  Component Value Date   DDIMER 1.05* 07/15/2013    Radiology/Studies  Dg Chest Port 1 View  10/15/2013   CLINICAL DATA:  Shortness of breath.  EXAM: PORTABLE CHEST - 1 VIEW  COMPARISON:  Chest radiograph Aug 14, 2013  FINDINGS: The cardiac silhouette appears at least moderately enlarged, increased from prior examination. Somewhat globular in appearance. Status post median sternotomy for apparent coronary artery bypass grafting. Increasing pulmonary vascular congestion with mild interstitial prominence. No pleural effusions or focal consolidations. No pneumothorax.  Multiple EKG lines overlie the patient and may obscure subtle underlying pathology. Soft tissue planes and included osseous structures are nonsuspicious.  IMPRESSION: Worsening cardiomegaly, likely related to CHF as there is increasing interstitial edema. However, superimposed pericardial effusion could have a similar appearance.   Electronically Signed   By: Elon Alas   On: 10/15/2013 06:52    ECG  Normal sinus rhythm.  Old inferior wall MI.  No acute ST-T wave changes.  2-D echo: On 07/16/13 Study Conclusions  - Left ventricle: The cavity size was normal. Systolic function was normal. The estimated ejection fraction was in the range of 50% to 55%. Possible mild hypokinesis of the basal-midinferolateral and inferior myocardium. Features are consistent with a pseudonormal left ventricular filling pattern, with concomitant abnormal relaxation and increased filling pressure (grade 2 diastolic dysfunction). - Left atrium: The atrium was mildly dilated. - Right ventricle: The cavity size was mildly dilated.  Wall thickness was normal. Systolic function was mildly reduced. - Pulmonary arteries: Systolic pressure was moderately increased. PA peak pressure: 75mm Hg (S). Impressions:  - Comparison with 2012 study shows a similar distribution of the wall motion abnormality (current study shows interval improvement).  ASSESSMENT AND PLAN 1. chest pain secondary to coronary ischemia exacerbated by severe anemia and possible recent cocaine use.  Initial troponins are normal 2. acute on chronic diastolic heart failure 3. Hypertension 4. anemia  Recommendation: Cycle cardiac enzymes and EKGs.  No heparin because of anemia, and troponins are normal so far.  Okay to continue low-dose carvedilol but would avoid higher dose beta blocker because of history of cocaine abuse.  Diurese with IV Lasix 80 mg IV twice a day ordered.  Treat chest pain with IV nitroglycerin and IV morphine.  Continue statins, aspirin 81 mg daily, clonidine, and amlodipine.  Apresoline can also be used for blood pressure control.  Anemia workup per primary service. Consider social services consult regarding cocaine abuse. Will follow with you Signed, Darlin Coco, MD  10/15/2013, 9:26 AM

## 2013-10-15 NOTE — ED Notes (Signed)
Pt knows that urine is needed. Urinal at bedside.

## 2013-10-15 NOTE — ED Notes (Signed)
Urinal at bedside.  

## 2013-10-15 NOTE — ED Provider Notes (Signed)
CSN: [id number]     Arrival date & time 10/15/13  0630 History   First MD Initiated Contact with Patient 10/15/13 [phone]     Chief Complaint  Patient presents with  . Shortness of Breath     (Consider location/radiation/quality/duration/timing/severity/associated sxs/prior Treatment) HPI  This is a 68 year old male with history of hypertension, CHF, coronary artery disease, hyperlipidemia, ischemic cardiomyopathy he presents with chest pain and shortness of breath. Patient reports onset of symptoms this morning at 5 AM. He states that his chest pain is sharp and nonradiating. It is currently 10 out of 10. Nothing makes it better or worse. Patient denies any worsening swelling of his lower extremities.  Reports compliance with his medications.  He states he's had pain like this in the past. He does endorse a dry cough. Denies any fevers, nausea, vomiting, abdominal pain.  History of cocaine use.  Last reported use on Sunday.  CAD s/p CABG 2009, and stage 3 CKD, Myoview in December 2014 no ischemia, ECHO 50-55%.   Past Medical History  Diagnosis Date  . Iron deficiency anemia   . Hypertension   . Chronic diastolic heart failure     a. 2D ECHO 2015 50-55% and G2DD  . CAD (coronary artery disease)     a. s/p 3-vessel CABG (12/2007) // 100% RCA stenosis with collaterals from left system. Severe bifurcation lesions of proxima CXA and OM. Moderate LAD diseaseFollowed by Dr. Wyline [facility]as in [facility]  . Hyperlipidemia LDL goal <70   . Obstructive sleep apnea     a. not on home CPAP  . Ischemic cardiomyopathy     a. EF now normalized (2012 EF 35-40%)  . Chronic kidney disease (CKD), stage III (moderate)     BL SCr 1.5-1.6  . Homelessness   . 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 Polysubstance abuse     a. cocaine, THC  . AV malformation of gastrointestinal tract   . Family history of early CAD   . DDD (degenerative disc  disease), lumbar   . Renal artery stenosis   . Peripheral vascular disease   . Seizure disorder   . Diabetes   . Seizures    Past Surgical History  Procedure Laterality Date  . Coronary artery bypass graft  12/2007  . Apc  03/2010    To treat small bowel AVMs  . Esophagogastroduodenoscopy N/A 08/14/2012    Procedure: ESOPHAGOGASTRODUODENOSCOPY (EGD);  Surgeon: [name], MD;  Location: [facility] ENDOSCOPY;  Service: Endoscopy;  Laterality: N/A;  . Hot hemostasis N/A 08/14/2012    Procedure: HOT HEMOSTASIS (ARGON PLASMA COAGULATION/BICAP);  Surgeon: [name], MD;  Location: [facility] ENDOSCOPY;  Service: Endoscopy;  Laterality: N/A;  . Esophagogastroduodenoscopy (egd) with propofol N/A 08/04/2013    Procedure: ESOPHAGOGASTRODUODENOSCOPY (EGD) WITH PROPOFOL;  Surgeon: [name], MD;  Location: [facility] ENDOSCOPY;  Service: Endoscopy;  Laterality: N/A;   Family History  Problem Relation Age of Onset  . Heart disease Mother     unknown type  . Heart disease Father 8    died of MI at 65yo  . Heart disease Paternal Grandfather 26    died of MI  . Heart disease    . Heart disease Brother 30   History  Substance Use Topics  . Smoking status: Current Some Day Smoker -- 0.20 packs/day for 13 years    Types: Cigarettes    Last Attempt to Quit: 03/20/1997  .  Smokeless tobacco: Never Used  . Alcohol Use: Yes     Comment: occasionally drinks, a few times a month    Review of Systems  Constitutional: Negative.  Negative for fever.  Respiratory: Positive for cough, chest tightness and shortness of breath.   Cardiovascular: Negative.  Negative for chest pain.  Gastrointestinal: Negative.  Negative for nausea, vomiting and abdominal pain.  Genitourinary: Negative.  Negative for dysuria.  Musculoskeletal: Negative for back pain.  Neurological: Negative for headaches.  All other systems reviewed and are negative.     Allergies  Motrin and Tylenol  Home Medications   Prior to Admission  medications   Medication Sig Start Date End Date Taking? Authorizing Provider  amLODipine (NORVASC) 5 MG tablet Take 1 tablet (5 mg total) by mouth daily. 08/25/13  Yes Amy D Clegg, NP  aspirin EC 81 MG tablet Take 1 tablet (81 mg total) by mouth daily. 08/18/13  Yes [name], MD  citalopram (CELEXA) 20 MG tablet Take 20 mg by mouth daily.   Yes Historical Provider, MD  cloNIDine (CATAPRES) 0.2 MG tablet Take 0.2 mg by mouth daily.   Yes Historical Provider, MD  furosemide (LASIX) 80 MG tablet Take 1 tablet (80 mg total) by mouth daily. 08/25/13  Yes Amy D Clegg, NP  gabapentin (NEURONTIN) 100 MG capsule Take 1 capsule (100 mg total) by mouth 2 (two) times daily. 07/21/13  Yes Charlynne Cousins, MD  pantoprazole (PROTONIX) 40 MG tablet Take 1 tablet (40 mg total) by mouth 2 (two) times daily. 08/04/13  Yes [name], MD  phenytoin (DILANTIN) 100 MG ER capsule Take 2-3 capsules (200-300 mg total) by mouth 2 (two) times daily. Take 2 capsules by mouth in the morning and 3 capsules by mouth at bedtime. 08/04/13  Yes [name], MD  simvastatin (ZOCOR) 40 MG tablet Take 1 tablet (40 mg total) by mouth every evening. 03/13/13  Yes [name], MD  tiZANidine (ZANAFLEX) 2 MG tablet Take 2 mg by mouth daily.  07/24/13  Yes Historical Provider, MD   BP 179/98  Pulse 66  Temp(Src) 98.2 F (36.8 C) (Oral)  Resp 15  SpO2 100% Physical Exam  Nursing note and vitals reviewed. Constitutional: He is oriented to person, place, and time. No distress.  Chronically ill-appearing, no acute distress  HENT:  Head: Normocephalic and atraumatic.  Eyes: Pupils are equal, round, and reactive to light.  Neck: Neck supple.  Cardiovascular: Normal rate, regular rhythm and normal heart sounds.   No murmur heard. Pulmonary/Chest: Effort normal. No respiratory distress. He has wheezes. He exhibits no tenderness.  Abdominal: Soft. Bowel sounds are normal. There is no tenderness. There is no rebound.   Musculoskeletal: He exhibits edema.  Trace bilateral lower extremity  Lymphadenopathy:    He has no cervical adenopathy.  Neurological: He is alert and oriented to person, place, and time.  Skin: Skin is warm and dry.  Psychiatric: He has a normal mood and affect.    ED Course  Procedures (including critical care time)  CRITICAL CARE Performed by: Thayer Jew, F   Total critical care time: 40 min  Critical care time was exclusive of separately billable procedures and treating other patients.  Critical care was necessary to treat or prevent imminent or life-threatening deterioration.  Critical care was time spent personally by me on the following activities: development of treatment plan with patient and/or surrogate as well as nursing, discussions with consultants, evaluation of patient's response  to treatment, examination of patient, obtaining history from patient or surrogate, ordering and performing treatments and interventions, ordering and review of laboratory studies, ordering and review of radiographic studies, pulse oximetry and re-evaluation of patient's condition.  Labs Review Labs Reviewed  CBC - Abnormal; Notable for the following:    RBC 2.94 (*)    Hemoglobin 7.9 (*)    HCT 25.6 (*)    All other components within normal limits  BASIC METABOLIC PANEL - Abnormal; Notable for the following:    BUN 24 (*)    Creatinine, Ser 1.39 (*)    GFR calc non Af Amer 50 (*)    GFR calc Af Amer 58 (*)    All other components within normal limits  PRO B NATRIURETIC PEPTIDE - Abnormal; Notable for the following:    Pro B Natriuretic peptide (BNP) 1973.0 (*)    All other components within normal limits  URINE RAPID DRUG SCREEN (HOSP PERFORMED)  Randolm Idol, ED    Imaging Review Dg Chest Port 1 View  10/15/2013   CLINICAL DATA:  Shortness of breath.  EXAM: PORTABLE CHEST - 1 VIEW  COMPARISON:  Chest radiograph Aug 14, 2013  FINDINGS: The cardiac silhouette appears  at least moderately enlarged, increased from prior examination. Somewhat globular in appearance. Status post median sternotomy for apparent coronary artery bypass grafting. Increasing pulmonary vascular congestion with mild interstitial prominence. No pleural effusions or focal consolidations. No pneumothorax.  Multiple EKG lines overlie the patient and may obscure subtle underlying pathology. Soft tissue planes and included osseous structures are nonsuspicious.  IMPRESSION: Worsening cardiomegaly, likely related to CHF as there is increasing interstitial edema. However, superimposed pericardial effusion could have a similar appearance.   Electronically Signed   By: Elon Alas   On: 10/15/2013 06:52     EKG Interpretation   Date/Time:  Wednesday October 15 2013 06:38:33 EDT Ventricular Rate:  68 PR Interval:  177 QRS Duration: 114 QT Interval:  467 QTC Calculation: 497 R Axis:   60 Text Interpretation:  Sinus rhythm Consider right ventricular hypertrophy  Inferior infarct, old Confirmed by OTTER  MD, OLGA ([id number]) on 10/15/2013  6:46:58 AM      MDM   Final diagnoses:  Chest pain, unspecified chest pain type  Acute on chronic diastolic heart failure  Cocaine abuse    Patient presents with chest pain. History of coronary artery disease and diastolic heart failure. Also with history of cocaine abuse. Hypertensive on initial evaluation. Patient was given full dose aspirin and nitroglycerin paste was applied. EKG is reassuring.  Chest x-ray shows cardiomegaly with increasing interstitial edema. She does not overtly look volume overloaded. He continues to endorse chest pain.  Patient was given morphine and placed on IV nitroglycerin drip given history and continued pain.  Troponin is negative. Patient was also given 80 mg IV Lasix.  UDS is pending but presumed positive for cocaine. Patient evaluated by cardiology who recommends admission to medicine for further management of acute on chronic  heart failure and ongoing chest pain.    Merryl Hacker, MD 10/15/13 [phone]

## 2013-10-15 NOTE — ED Notes (Signed)
Pt informed again that urine was needed pt stated that he could not void

## 2013-10-15 NOTE — Progress Notes (Signed)
Received a call from lab. +mrsa swab, placed on contact isolation.

## 2013-10-15 NOTE — ED Notes (Signed)
Paged Dr. Eulas Post, made aware pt continued CP despite nitro interventions. Reports hold nitro where it is, since not helping. Will give PO meds, continue to monitor. Will hold morphine since not helping per admitting MD. Pt is still stepdown status.

## 2013-10-15 NOTE — ED Notes (Signed)
Admitting MD at bedside.

## 2013-10-15 NOTE — Progress Notes (Signed)
No-compliant, taking out BP cuff despite instructions given that v/s are being monitored.

## 2013-10-15 NOTE — H&P (Signed)
Date: 10/15/2013               Patient Name:  Charles [name] MRN: [id number]  DOB: 1945-12-04 Age / Sex: 69 y.o., male   PCP: [name]         Medical Service: Internal Medicine Teaching Service         Attending Physician: Dr. Karren Cobble, MD    First Contact: Dr. Julious Oka Pager: [phone]  Second Contact: Dr. Jerene Pitch Pager: [phone]       After Hours (After 5p/  First Contact Pager: [phone]  weekends / holidays): Second Contact Pager: [phone]   Chief Complaint: Chest pain and SOB  History of Present Illness:  Pt is a 68 yo M with PMH GI bleed, CHF (ECHO 07/16/13 with normal systolic function, EF 43-56%, grade 2 diastolic dysfunction), HTN, CAD s/p CABG, HLD, COPD, previous smoker, cocaine abuse, and sleep apnea presents with acute onset SOB and chest pain.  He states that he was in his normal state of health last night and went to bed as usual, but was awoken at 5am with shortness of breath and 10/10 chest pain. He states that the SOB is intermittent but occurs about 2x month. He states that his chest pain also occurs about 2x month and extends from the right to left mid chest down around his xyphoid process but does not radiate. He states that the pain is sharp and can last for hours to a whole day. Currently his pain is 8/10 on a Nitro drip and after 4mg  IV Morphine.  He also endorses intermittent peripheral edema, but states that his swelling has been controlled lately. He is unsure of recent weight gain. He denies fevers but endorses a dry cough for a number of months.   He denies tobacco use, but was noted to be a smoker at his last admission in June for CHF exacerbation, and he has a reported h/o COPD. He denies EtOH use, but does smoke cocaine rarely. He states that his roommates are bad influences on him and he wants to move out of the house to a senior living facility.   Meds: Current Facility-Administered Medications  Medication Dose Route  Frequency Provider Last Rate Last Dose  . nitroGLYCERIN (NITROGLYN) 2 % ointment 1 inch  1 inch Topical 4 times per day Merryl Hacker, MD   1 inch at 10/15/13 8616   Current Outpatient Prescriptions  Medication Sig Dispense Refill  . amLODipine (NORVASC) 5 MG tablet Take 1 tablet (5 mg total) by mouth daily.  30 tablet  6  . aspirin EC 81 MG tablet Take 1 tablet (81 mg total) by mouth daily.      . citalopram (CELEXA) 20 MG tablet Take 20 mg by mouth daily.      . cloNIDine (CATAPRES) 0.2 MG tablet Take 0.2 mg by mouth daily.      . furosemide (LASIX) 80 MG tablet Take 1 tablet (80 mg total) by mouth daily.  30 tablet  6  . gabapentin (NEURONTIN) 100 MG capsule Take 1 capsule (100 mg total) by mouth 2 (two) times daily.  60 capsule  2  . pantoprazole (PROTONIX) 40 MG tablet Take 1 tablet (40 mg total) by mouth 2 (two) times daily.  60 tablet  0  . phenytoin (DILANTIN) 100 MG ER capsule Take 2-3 capsules (200-300 mg total) by mouth 2 (two) times daily. Take 2 capsules by mouth in the morning and 3 capsules by  mouth at bedtime.  90 capsule  2  . simvastatin (ZOCOR) 40 MG tablet Take 1 tablet (40 mg total) by mouth every evening.  30 tablet  1  . tiZANidine (ZANAFLEX) 2 MG tablet Take 2 mg by mouth daily.         Allergies: Allergies as of 10/15/2013 - Review Complete 10/15/2013  Allergen Reaction Noted  . Motrin [ibuprofen] Other (See Comments) 05/16/2010  . Tylenol [acetaminophen] Other (See Comments) 05/16/2010   Past Medical History  Diagnosis Date  . Iron deficiency anemia   . Hypertension   . Chronic diastolic heart failure     a. 2D ECHO 2015 50-55% and G2DD  . CAD (coronary artery disease)     a. s/p 3-vessel CABG (12/2007) // 100% RCA stenosis with collaterals from left system. Severe bifurcation lesions of proxima CXA and OM. Moderate LAD diseaseFollow[facility]  . Hyperlipidemia LDL goal <70   . Obstructive sleep apnea     a. not on home CPAP  .  Ischemic cardiomyopathy     a. EF now normalized (2012 EF 35-40%)  . Chronic kidney disease (CKD), stage III (moderate)     BL SCr 1.5-1.6  . Homelessness   . 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 Polysubstance abuse     a. cocaine, THC  . AV malformation of gastrointestinal tract   . Family history of early CAD   . DDD (degenerative disc disease), lumbar   . Renal artery stenosis   . Peripheral vascular disease   . Seizure disorder   . Diabetes   . Seizures    Past Surgical History  Procedure Laterality Date  . Coronary artery bypass graft  12/2007  . Apc  03/2010    To treat small bowel AVMs  . Esophagogastroduodenoscopy N/A 08/14/2012    Procedure: ESOPHAGOGASTRODUODENOSCOPY (EGD);  Surgeon: [name], MD;  Location: [facility] ENDOSCOPY;  Service: Endoscopy;  Laterality: N/A;  . Hot hemostasis N/A 08/14/2012    Procedure: HOT HEMOSTASIS (ARGON PLASMA COAGULATION/BICAP);  Surgeon: [name], MD;  Location: [facility] ENDOSCOPY;  Service: Endoscopy;  Laterality: N/A;  . Esophagogastroduodenoscopy (egd) with propofol N/A 08/04/2013    Procedure: ESOPHAGOGASTRODUODENOSCOPY (EGD) WITH PROPOFOL;  Surgeon: [name], MD;  Location[facility] ENDOSCOPY;  Service: Endoscopy;  Laterality: N/A;   Family History  Problem Relation Age of Onset  . Heart disease Mother     unknown type  . Heart disease Father 62    died of MI at 29yo  . Heart disease Paternal Grandfather 66    died of MI  . Heart disease    . Heart disease Brother 30   History   Social History  . Marital Status: Single    Spouse Name: N/A    Number of Children: 2  . Years of Education: N/A   Occupational History  . Unemployed     previously worked in [employer]  .     Social History Main Topics  . Smoking status: Current Some Day Smoker -- 0.20 packs/day for 13 years    Types: Cigarettes    Last Attempt to Quit: 03/20/1997  . Smokeless tobacco: Never  Used  . Alcohol Use: Yes     Comment: occasionally drinks, a few times a month  . Drug Use: 1.00 per week    Special: Cocaine, Marijuana  . Sexual Activity: No   Other  Topics Concern  . Not on file   Social History Narrative   Lives in [place]. Originally from [place], has been in the [place] since [place]s                   Review of Systems: A 12 point ROS was performed; pertinent positives and negatives were noted in the HPI   Physical Exam: Blood pressure 167/85, pulse 65, temperature 98.2 F (36.8 C), temperature source Oral, resp. rate 20, SpO2 100.00%. General: Alert & oriented x 3, well-developed, and cooperative on examination.  Head: Normocephalic and atraumatic.  Eyes: Pupils equal, round, and reactive to light, EOMI.  Mouth: Pharynx pink and moist, no erythema or exudates.  Neck: Supple, full ROM, no thyromegaly, +JVD to angle of the jaw.  Lungs: +Crackles 1/2 way up lung fields, Scattered end expiratory wheezes. Normal respiratory effort, no accessory muscle use. Heart: Regular rate, regular rhythm, no murmur.  Abdomen: Soft, non-tender, non-distended, normal bowel sounds, no guarding, no rebound tenderness, no organomegaly.  Msk: No joint swelling, warmth.  Extremities: 2+ radial pulses bilaterally. No cyanosis, clubbing.Trace BLE edema.  Neurologic: Alert & oriented X3, cranial nerves II-XII intact, strength normal in all extremities, sensation intact to light touch, and gait normal.  Skin: Dry, scaling at the feel. Scattered excoriations on BLE and back. No breakdown or ulcerations. Psych: Normal mood and affect.    Lab results: Basic Metabolic Panel:  Recent Labs  10/15/13 0654  NA 137  K 4.6  CL 103  CO2 20  GLUCOSE 96  BUN 24*  CREATININE 1.39*  CALCIUM 8.4   CBC:  Recent Labs  10/15/13 0654  WBC 4.7  HGB 7.9*  HCT 25.6*  MCV 87.1  PLT 290   Cardiac Enzymes: 10/15/13 i-stat troponin: 0.04  BNP:  Recent Labs   10/15/13 0654  PROBNP 1973.0*   Urine Drug Screen: Drugs of Abuse     Component Value Date/Time   LABOPIA NONE DETECTED 10/15/2013 0914   COCAINSCRNUR POSITIVE* 10/15/2013 0914   LABBENZ NONE DETECTED 10/15/2013 0914   AMPHETMU NONE DETECTED 10/15/2013 0914   THCU NONE DETECTED 10/15/2013 0914   LABBARB NONE DETECTED 10/15/2013 0914      Imaging results:  Dg Chest Port 1 View  10/15/2013   CLINICAL DATA:  Shortness of breath.  EXAM: PORTABLE CHEST - 1 VIEW  COMPARISON:  Chest radiograph Aug 14, 2013  FINDINGS: The cardiac silhouette appears at least moderately enlarged, increased from prior examination. Somewhat globular in appearance. Status post median sternotomy for apparent coronary artery bypass grafting. Increasing pulmonary vascular congestion with mild interstitial prominence. No pleural effusions or focal consolidations. No pneumothorax.  Multiple EKG lines overlie the patient and may obscure subtle underlying pathology. Soft tissue planes and included osseous structures are nonsuspicious.  IMPRESSION: Worsening cardiomegaly, likely related to CHF as there is increasing interstitial edema. However, superimposed pericardial effusion could have a similar appearance.   Electronically Signed   By: Elon Alas   On: 10/15/2013 06:52    Other results: EKG: Sinus rhythm. Normal axis. LVH. QTc prolongation not present on previous EKG, otherwise no significant changes from previous EKG 08/19/13.  Assessment & Plan by Problem: Pt is a 68 yo M with PMH GI bleed, CHF (ECHO 07/16/13 with normal systolic function, EF 70-35%, grade 2 diastolic dysfunction), HTN, CAD s/p CABG, HLD, COPD, previous smoker, cocaine abuse, and sleep apnea presents with acute onset SOB and chest pain.  Chest pain, likely multifactorial:  Pt presented with acute onset 10/10 mid sternal, sharp chest pain and SOB. H/o CAD, s/p CABG '09, HLD, HTN, and cocaine use. The pain is likely multifactorial from cocaine, anemia, and  volume overload from CHF. He last used cocaine on Sunday. He was found to be anemic in the ED to 7.9. In addition, CXR shows edema, and ProBNP is elevated 1973, in the setting of his CKD 3; 3274 in June. i-stat troponin negative x1, and EKG with significant changes from June. The pain has only been somewhat relieved on a nitro drip and with 4mg  IV Morphine. TIMI score is 5, given the patient a 26% risk of a bad outcome. Cardiology was consulted in the ED and recommended to continue the drip, trend troponins, hold off on the heparin 2/2 to his anemia and h/o GI bleed, and give 80mg  IV Lasix BID to help with his volume status.  - Admit to IMTS to SDU - Nitro drip - Troponin q6 x3 - AM EKG - Morphine 2-4mg  IV q3 PRN - IV Lasix per Cardiology - Checking lipid panel - Continue Clonidine and amlodipine - Statin daily - ASA 81mg  daily - Avoiding beta blocker given recent cocaine use.   Acute on chronic diastolic CHF: Last TTE from 4/15 with normal systolic function, EF 40-98%, grade 2 diastolic dysfunction. Pt is on Lasix 80mg  daily at home and endorses compliance with his medications. CXR with interstitial edema, crackles on lung exam, trace edema of BLE. ProBNP elevated but in the setting of CKD and improved from June. Cardiology recommended Lasix 80mg  IV BID for diuresis; pt received one dose in the ED. - Lasix 80mg  IV BID  Chronic iron deficiency anemia: Pt with h/o iron deficiency anemia. Last anemia panel was in May with iron of 16. He is not on iron supplementation. No acute blood loss.  - Ferrous Sulfate 325mg  TID w/ meals. - CBC in am  CAD (coronary artery disease): H/o CAD w/ CABG '09, presents with 10/10 chest pain and SOB as noted above. Will try to optimize medically, and r/o for ACS.   Hypertension: Pt on amlodipine, clonidine, and furosemide at home and endorses compliance with his medications. BP elevated on admission, but pt c/o 8/10 pain currently. Continuing home meds.   HLD: Pt  on simvastatin at home. Last lipid panel 3/14. - Lipid panel - Continue statin  Chronic kidney disease (CKD), stage III (moderate): Stable. Cr 1.39 with GFR 58. Unsure of his baseline, but today renal function appears stable.  - AM BMP  Cocaine abuse: Pt intermittently using cocaine, last use Sunday.  - SW for drug counseling  QTc prolongation: QTc 497 on admission EKG, up from 461 in June. Checking Mg level and avoiding QT prolonging drugs.  H/o seizures: Stable. Pt on Phenytoin at home which was continued on admission.   DVT PPx: [place] Heparin   Dispo: Disposition is deferred at this time, awaiting improvement of current medical problems. Anticipated discharge in approximately 1-3 day(s).   The patient does have a current PCP [name]) and does not need an [facility] hospital follow-up appointment after discharge.  The patient does have transportation limitations that hinder transportation to clinic appointments.  Signed: Otho Bellows, MD 10/15/2013, 11:54 AM

## 2013-10-15 NOTE — ED Notes (Signed)
[employer] Meal tray ordered for patient.

## 2013-10-15 NOTE — ED Notes (Signed)
Per EMS: Pt reports he woke up SOB. 96% on RA initially. Pt hypertensive on scene. Noted some L sided CP that radiated down towards his abdomen. Currently Ax4, NAD. 24 Ga R hand. 12 lead unremarkable. RBBB

## 2013-10-16 ENCOUNTER — Other Ambulatory Visit: Payer: Self-pay

## 2013-10-16 DIAGNOSIS — N183 Chronic kidney disease, stage 3 unspecified: Secondary | ICD-10-CM

## 2013-10-16 DIAGNOSIS — I130 Hypertensive heart and chronic kidney disease with heart failure and stage 1 through stage 4 chronic kidney disease, or unspecified chronic kidney disease: Principal | ICD-10-CM

## 2013-10-16 DIAGNOSIS — E785 Hyperlipidemia, unspecified: Secondary | ICD-10-CM

## 2013-10-16 DIAGNOSIS — N189 Chronic kidney disease, unspecified: Principal | ICD-10-CM

## 2013-10-16 DIAGNOSIS — F1410 Cocaine abuse, uncomplicated: Secondary | ICD-10-CM

## 2013-10-16 DIAGNOSIS — I509 Heart failure, unspecified: Secondary | ICD-10-CM

## 2013-10-16 LAB — BASIC METABOLIC PANEL
ANION GAP: 14 (ref 5–15)
BUN: 26 mg/dL — AB (ref 6–23)
CO2: 21 meq/L (ref 19–32)
CREATININE: 1.48 mg/dL — AB (ref 0.50–1.35)
Calcium: 7.8 mg/dL — ABNORMAL LOW (ref 8.4–10.5)
Chloride: 101 mEq/L (ref 96–112)
GFR calc Af Amer: 54 mL/min — ABNORMAL LOW (ref >90)
GFR calc non Af Amer: 47 mL/min — ABNORMAL LOW (ref >90)
Glucose, Bld: 93 mg/dL (ref 70–99)
Potassium: 4.2 mEq/L (ref 3.7–5.3)
Sodium: 136 mEq/L — ABNORMAL LOW (ref 137–147)

## 2013-10-16 LAB — TROPONIN I

## 2013-10-16 MED ORDER — FUROSEMIDE 80 MG PO TABS
80.0000 mg | ORAL_TABLET | Freq: Every day | ORAL | Status: DC
  Administered 2013-10-17: 80 mg via ORAL
  Filled 2013-10-16: qty 1

## 2013-10-16 MED ORDER — ISOSORBIDE MONONITRATE ER 30 MG PO TB24
30.0000 mg | ORAL_TABLET | Freq: Every day | ORAL | Status: DC
  Filled 2013-10-16: qty 1

## 2013-10-16 MED ORDER — ISOSORBIDE MONONITRATE ER 60 MG PO TB24
60.0000 mg | ORAL_TABLET | Freq: Every day | ORAL | Status: DC
  Administered 2013-10-16: 60 mg via ORAL
  Filled 2013-10-16: qty 1

## 2013-10-16 MED ORDER — FERROUS SULFATE 325 (65 FE) MG PO TABS: 325.0000 mg | ORAL_TABLET | Freq: Three times a day (TID) | ORAL | Status: DC

## 2013-10-16 MED ORDER — LORAZEPAM 2 MG/ML IJ SOLN
0.5000 mg | Freq: Once | INTRAMUSCULAR | Status: AC
  Administered 2013-10-16: 0.5 mg via INTRAVENOUS
  Filled 2013-10-16: qty 1

## 2013-10-16 MED ORDER — NITROGLYCERIN IN D5W 200-5 MCG/ML-% IV SOLN
2.0000 ug/min | INTRAVENOUS | Status: DC
  Administered 2013-10-16: 20 ug/min via INTRAVENOUS
  Administered 2013-10-17 (×2): 200 ug/min via INTRAVENOUS
  Filled 2013-10-16 (×3): qty 250

## 2013-10-16 MED ORDER — DOCUSATE SODIUM 100 MG PO CAPS
100.0000 mg | ORAL_CAPSULE | Freq: Every day | ORAL | Status: DC | PRN
  Filled 2013-10-16: qty 1

## 2013-10-16 NOTE — Progress Notes (Signed)
Utilization review completed. Jaksen Fiorella, RN, BSN. 

## 2013-10-16 NOTE — Progress Notes (Signed)
Subjective: Pt extremely lethargic during exam today. He received 0.5mg  ativan for 7/10 chest pain before rounds today. Pt does state improvement of chest pain.   Objective: Vital signs in last 24 hours: Filed Vitals:   10/16/13 0130 10/16/13 0400 10/16/13 0700 10/16/13 0800  BP: 124/61 136/66 141/92   Pulse: 55 53 57   Temp:    97.6 F (36.4 C)  TempSrc:    Oral  Resp: 15 20 15    Height:      Weight:      SpO2: 100% 95% 97% 98%   Weight change:   Intake/Output Summary (Last 24 hours) at 10/16/13 1110 Last data filed at 10/16/13 1100  Gross per 24 hour  Intake    365 ml  Output   2301 ml  Net  -1936 ml   BP 141/92  Pulse 57  Temp(Src) 97.6 F (36.4 C) (Oral)  Resp 15  Ht 5\' 5"  (1.651 m)  Wt 178 lb 2.1 oz (80.8 kg)  BMI 29.64 kg/m2  SpO2 98%  General Appearance:   lethargic  Lungs:   Wheezing on auscultation of lung fields, no crackles  Chest wall:    No chest wall tenderness to palpation  Heart:    Regular rate and rhythm, S1 and S2 normal, no murmur, rub   or gallop  Abdomen:     Soft, non-tender, bowel sounds active   Lab Results: Basic Metabolic Panel:  Recent Labs Lab 10/15/13 0654 10/15/13 1314 10/16/13 0050  NA 137  --  136*  K 4.6  --  4.2  CL 103  --  101  CO2 20  --  21  GLUCOSE 96  --  93  BUN 24*  --  26*  CREATININE 1.39* 1.36* 1.48*  CALCIUM 8.4  --  7.8*  MG  --  1.9  --    CBC:  Recent Labs Lab 10/15/13 0654 10/15/13 1314  WBC 4.7 4.0  HGB 7.9* 8.0*  HCT 25.6* 25.5*  MCV 87.1 87.6  PLT 290 284   Cardiac Enzymes:  Recent Labs Lab 10/15/13 1314 10/15/13 1906 10/16/13 0050  TROPONINI <0.30 <0.30 <0.30   BNP:  Recent Labs Lab 10/15/13 0654  PROBNP 1973.0*   Fasting Lipid Panel:  Recent Labs Lab 10/15/13 1314  CHOL 88  HDL 32*  LDLCALC 42  TRIG 69  CHOLHDL 2.8   Urine Drug Screen: Drugs of Abuse     Component Value Date/Time   LABOPIA NONE DETECTED 10/15/2013 0914   COCAINSCRNUR POSITIVE* 10/15/2013  0914   LABBENZ NONE DETECTED 10/15/2013 0914   AMPHETMU NONE DETECTED 10/15/2013 0914   THCU NONE DETECTED 10/15/2013 0914   LABBARB NONE DETECTED 10/15/2013 1950    Micro Results: Recent Results (from the past 240 hour(s))  MRSA PCR SCREENING     Status: Abnormal   Collection Time    10/15/13  4:08 PM      Result Value Ref Range Status   MRSA by PCR POSITIVE (*) NEGATIVE Final   Comment:            The GeneXpert MRSA Assay (FDA     approved for NASAL specimens     only), is one component of a     comprehensive MRSA colonization     surveillance program. It is not     intended to diagnose MRSA     infection nor to guide or     monitor treatment for     MRSA infections.  RESULT CALLED TO, READ BACK BY AND VERIFIED WITH:     DECENA RN 19:00 10/15/13 ([email or web address])   Studies/Results: Dg Chest Port 1 View  10/15/2013   CLINICAL DATA:  Shortness of breath.  EXAM: PORTABLE CHEST - 1 VIEW  COMPARISON:  Chest radiograph Aug 14, 2013  FINDINGS: The cardiac silhouette appears at least moderately enlarged, increased from prior examination. Somewhat globular in appearance. Status post median sternotomy for apparent coronary artery bypass grafting. Increasing pulmonary vascular congestion with mild interstitial prominence. No pleural effusions or focal consolidations. No pneumothorax.  Multiple EKG lines overlie the patient and may obscure subtle underlying pathology. Soft tissue planes and included osseous structures are nonsuspicious.  IMPRESSION: Worsening cardiomegaly, likely related to CHF as there is increasing interstitial edema. However, superimposed pericardial effusion could have a similar appearance.   Electronically Signed   By: Elon Alas   On: 10/15/2013 06:52   Medications: I have reviewed the patient's current medications. Scheduled Meds: . amLODipine  5 mg Oral Daily  . aspirin EC  81 mg Oral Daily  . Chlorhexidine Gluconate Cloth  6 each Topical Q0600  . citalopram  20 mg Oral  Daily  . cloNIDine  0.2 mg Oral Daily  . ferrous sulfate  325 mg Oral TID WC  . furosemide  80 mg Intravenous BID  . gabapentin  100 mg Oral BID  . heparin  5,000 Units Subcutaneous 3 times per day  . mupirocin ointment  1 application Nasal BID  . pantoprazole  40 mg Oral BID  . phenytoin  200 mg Oral Daily  . phenytoin  300 mg Oral QHS  . simvastatin  40 mg Oral QPM   Continuous Infusions: . nitroGLYCERIN 70 mcg/min (10/16/13 0700)   PRN Meds:. Assessment/Plan:  Chest pain, likely multifactorial:likely multifactorial from cocaine, anemia, and volume overload from CHF. He last used cocaine on Sunday. He was found to be anemic in the ED to 7.9. In addition, CXR shows edema, and ProBNP is elevated 1973 although pt has CKD. Last BNP was 3274 in June. EKG on admission revealed T wave changes compared to previous EKG likely pseudonormalization. Myoview in 2012 was negative for reversible ischemia.Troponin negative x 3 - d/c Nitro drip and transition to imdur - Continue Clonidine and amlodipine  - ASA 81mg  daily  - Avoiding beta blocker given recent cocaine use  -will consider benzos for chest pain w/ hx of cocaine abuse that could be causing vasospasm  - per chart review pt was on bidil 20/37.5 in June, however seems to have fallen off med list. Not seen on f/u heart failure note. Will consider restarting along with po nitrate   Acute on chronic diastolic CHF: Last TTE from 4/15 with normal systolic function, EF 10-93%, grade 2 diastolic dysfunction. Pt is on Lasix 80mg  daily at home and endorses compliance with his medications. CXR with interstitial edema, crackles on lung exam, trace edema of BLE on admission. ProBNP elevated but in the setting of CKD and improved from June. Cardiology recommended Lasix 80mg  IV BID for diuresis; pt received one dose in the ED w/ a bump in Cr to 1.48 today. No crackles auscultated on exam today.  - Lasix 80mg  IV BID transitioned to home lasix 80mg  once a  day  Possible early iron deficiency anemia: anemia panel 5/16 revealed iron 16 ferritin 34, baseline Hgb around 8.0 -Colonoscopy 2012 by Dr. Henrene Pastor revealed normal colon and internal hemorrhoids, recommended repeat in 10 years.  - 04/15/10 enteroscopy found  3 typical appearing small bowel AVMs tx with APC for destruction and 2cm hiatal hernia.  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 BP elevated on admission at 169/91, currently 141/92 - continuing home meds   HLD: Pt on simvastatin at home. Lipid panel today revealed LDL 42 and HDL 32 - Continue statin   Chronic kidney disease (CKD), stage III (moderate): Cr 1.39 on admission with GFR 58.  - today Cr 1.48, baseline appears to be around 1.10 - d/c'd lasix IV and transitioned to home po dose  H/o seizures: Stable. Pt on Phenytoin at home which was continued on admission.   Cocaine abuse: Pt intermittently using cocaine, last use Sunday.  - SW for drug counseling   DVT PPx: [place] Heparin   Dispo: Disposition is deferred at this time, awaiting improvement of current medical problems. Anticipated discharge in approximately 1-3 day(s).   The patient does have a current PCP [name]) and does not need an [facility] hospital follow-up appointment after discharge.   The patient does have transportation limitations that hinder transportation to clinic appointments.    LOS: 1 day   Julious Oka, MD 10/16/2013, 11:10 AM

## 2013-10-16 NOTE — Progress Notes (Signed)
EKG done,nitro gtt restarted for bp and chest pain control.

## 2013-10-16 NOTE — Progress Notes (Signed)
Patient ID: Charles [name], male   DOB: [birth year], 68 y.o.   MRN: [id number]   SUBJECTIVE: Patient is lying flat, sleeping comfortably.  Per nurse, no complaints of chest pain this morning.   Scheduled Meds: . amLODipine  5 mg Oral Daily  . aspirin EC  81 mg Oral Daily  . Chlorhexidine Gluconate Cloth  6 each Topical Q0600  . citalopram  20 mg Oral Daily  . cloNIDine  0.2 mg Oral Daily  . ferrous sulfate  325 mg Oral TID WC  . [START ON 10/17/2013] furosemide  80 mg Oral Daily  . gabapentin  100 mg Oral BID  . heparin  5,000 Units Subcutaneous 3 times per day  . isosorbide mononitrate  60 mg Oral Daily  . mupirocin ointment  1 application Nasal BID  . pantoprazole  40 mg Oral BID  . phenytoin  200 mg Oral Daily  . phenytoin  300 mg Oral QHS  . simvastatin  40 mg Oral QPM   Continuous Infusions:  PRN Meds:.docusate sodium    Filed Vitals:   10/16/13 0130 10/16/13 0400 10/16/13 0700 10/16/13 0800  BP: 124/61 136/66 141/92   Pulse: 55 53 57   Temp:    97.6 F (36.4 C)  TempSrc:    Oral  Resp: 15 20 15    Height:      Weight:      SpO2: 100% 95% 97% 98%    Intake/Output Summary (Last 24 hours) at 10/16/13 1211 Last data filed at 10/16/13 1100  Gross per 24 hour  Intake    365 ml  Output   1601 ml  Net  -1236 ml    LABS: Basic Metabolic Panel:  Recent Labs  10/15/13 0654 10/15/13 1314 10/16/13 0050  NA 137  --  136*  K 4.6  --  4.2  CL 103  --  101  CO2 20  --  21  GLUCOSE 96  --  93  BUN 24*  --  26*  CREATININE 1.39* 1.36* 1.48*  CALCIUM 8.4  --  7.8*  MG  --  1.9  --    Liver Function Tests: No results found for this basename: AST, ALT, ALKPHOS, BILITOT, PROT, ALBUMIN,  in the last 72 hours No results found for this basename: LIPASE, AMYLASE,  in the last 72 hours CBC:  Recent Labs  10/15/13 0654 10/15/13 1314  WBC 4.7 4.0  HGB 7.9* 8.0*  HCT 25.6* 25.5*  MCV 87.1 87.6  PLT 290 284   Cardiac Enzymes:  Recent Labs  10/15/13 1314  10/15/13 1906 10/16/13 0050  TROPONINI <0.30 <0.30 <0.30   BNP: No components found with this basename: POCBNP,  D-Dimer: No results found for this basename: DDIMER,  in the last 72 hours Hemoglobin A1C: No results found for this basename: HGBA1C,  in the last 72 hours Fasting Lipid Panel:  Recent Labs  10/15/13 1314  CHOL 88  HDL 32*  LDLCALC 42  TRIG 69  CHOLHDL 2.8   Thyroid Function Tests: No results found for this basename: TSH, T4TOTAL, FREET3, T3FREE, THYROIDAB,  in the last 72 hours Anemia Panel: No results found for this basename: VITAMINB12, FOLATE, FERRITIN, TIBC, IRON, RETICCTPCT,  in the last 72 hours  RADIOLOGY: Dg Chest Port 1 View  10/15/2013   CLINICAL DATA:  Shortness of breath.  EXAM: PORTABLE CHEST - 1 VIEW  COMPARISON:  Chest radiograph Aug 14, 2013  FINDINGS: The cardiac silhouette appears at least moderately enlarged, increased  from prior examination. Somewhat globular in appearance. Status post median sternotomy for apparent coronary artery bypass grafting. Increasing pulmonary vascular congestion with mild interstitial prominence. No pleural effusions or focal consolidations. No pneumothorax.  Multiple EKG lines overlie the patient and may obscure subtle underlying pathology. Soft tissue planes and included osseous structures are nonsuspicious.  IMPRESSION: Worsening cardiomegaly, likely related to CHF as there is increasing interstitial edema. However, superimposed pericardial effusion could have a similar appearance.   Electronically Signed   By: Elon Alas   On: 10/15/2013 06:52    PHYSICAL EXAM General: NAD Neck: No JVD, no thyromegaly or thyroid nodule.  Lungs: Clear to auscultation bilaterally with normal respiratory effort. CV: Nondisplaced PMI.  Heart regular S1/S2, no S3/S4, no murmur.  Trace ankle edema.   Abdomen: Soft, nontender, no hepatosplenomegaly, no distention.  Neurologic:Sleeping  Extremities: No clubbing or cyanosis.    TELEMETRY: Reviewed telemetry pt in NSR  ASSESSMENT AND PLAN: 68 yo with history of CAD s/p CABG and diastolic CHF was admitted with chest pain, acute/chronic diastolic CHF, and recent cocaine use.  1. CAD: h/o CABG.  Cardiolite in 12/14 with scar, no ischemia.  Currently no chest pain.  Troponin negative x 3.  It is possible that this was cocaine-related chest pain (vasospasm).  - Continue amlodipine and Imdur (increase to 60 mg daily), would avoid beta blockers with active cocaine use (UDS was positive this admission).  - If no further chest pain, would not repeat Cardiolite.  2. Acute on chronic diastolic CHF: He diuresed well initially, now back on po Lasix.  He is lying flat and does not look markedly volume overloaded.  3. CKD: stable.   Loralie Champagne 10/16/2013

## 2013-10-16 NOTE — Progress Notes (Signed)
Sleeping most of the time after given ativan this am.

## 2013-10-16 NOTE — Clinical Documentation Improvement (Signed)
Patient presents with Acute on Chronic Systolic and Diastolic Heart Failure. Has a history of Hypertension and CKD3. There is an assumed link between Hypertension and Renal Failure; there is none between Hypertension and CHF. It needs to documented if the link is there.  Please clarify if the patient has:   Hypertensive Heart and Chronic Kidney Disease Stage 3 With Heart Failure  Cardiorenal Syndrome  Hypertensive CKD3  Other Condition  Thank You, Zoila Shutter ,RN Clinical Documentation Specialist:  [phone]  [facility] [employer] [facility]

## 2013-10-16 NOTE — Progress Notes (Signed)
Weaning off nitro.

## 2013-10-16 NOTE — Progress Notes (Signed)
Pt did complain of chest tightness after washing pajamas with old blood PTA,bp was noted to be elevated-refused oxygen awaiting for MD callback.

## 2013-10-16 NOTE — Progress Notes (Signed)
Internal Medicine Attending Admission Note Date: 10/16/2013  Patient name: Charles [name] Medical record number: [id number] Date of birth: 11/30/1945 Age: 68 y.o. Gender: male  I saw and evaluated the patient. I reviewed the resident's note and I agree with the resident's findings and plan as documented in the resident's note.  Chief Complaint(s): Chest pain with shortness of breath  History - key components related to admission:  [name] is a 68 year old man with a history of coronary artery disease status post coronary artery bypass grafting, grade 2 diastolic heart failure, hypertension, hyperlipidemia, cocaine abuse, and sleep apnea who presents with chest pain and shortness of breath. He was in his usual state of health until the night of admission when he was suddenly awoken with shortness of breath and chest pain. He characterized the chest pain as sharp, 10 out of 10, and wrapping from right to left in his mid chest. He denied dizziness, diaphoresis, or nausea. He admits to recent use of cocaine.  On rounds this morning he was resting comfortably in the CCU without chest pain or shortness of breath while on a nitroglycerin drip.  Physical Exam - key components related to admission:  Filed Vitals:   10/16/13 0130 10/16/13 0400 10/16/13 0700 10/16/13 0800  BP: 124/61 136/66 141/92   Pulse: 55 53 57   Temp:    97.6 F (36.4 C)  TempSrc:    Oral  Resp: 15 20 15    Height:      Weight:      SpO2: 100% 95% 97% 98%   General: Well-developed, well-nourished, man lying comfortably in bed in no acute distress. He was somnolent but arousable. Heart: Regular rate and rhythm without murmurs, rubs, or gallops. There was respiratory variation in his S2 splitting. Extremities: Trace pitting edema to the midshin bilaterally.  Lab results:  Basic Metabolic Panel:  Recent Labs  10/15/13 0654 10/15/13 1314 10/16/13 0050  NA 137  --  136*  K 4.6  --  4.2  CL 103  --  101  CO2  20  --  21  GLUCOSE 96  --  93  BUN 24*  --  26*  CREATININE 1.39* 1.36* 1.48*  CALCIUM 8.4  --  7.8*  MG  --  1.9  --    CBC:  Recent Labs  10/15/13 0654 10/15/13 1314  WBC 4.7 4.0  HGB 7.9* 8.0*  HCT 25.6* 25.5*  MCV 87.1 87.6  PLT 290 284   Cardiac Enzymes:  Recent Labs  10/15/13 1314 10/15/13 1906 10/16/13 0050  TROPONINI <0.30 <0.30 <0.30   Fasting Lipid Panel:  Recent Labs  10/15/13 1314  CHOL 88  HDL 32*  LDLCALC 42  TRIG 69  CHOLHDL 2.8   Urine Drug Screen:  Positive for cocaine  Misc. Labs:  ProBNP 1973  Imaging results:  Dg Chest Port 1 View  10/15/2013   CLINICAL DATA:  Shortness of breath.  EXAM: PORTABLE CHEST - 1 VIEW  COMPARISON:  Chest radiograph Aug 14, 2013  FINDINGS: The cardiac silhouette appears at least moderately enlarged, increased from prior examination. Somewhat globular in appearance. Status post median sternotomy for apparent coronary artery bypass grafting. Increasing pulmonary vascular congestion with mild interstitial prominence. No pleural effusions or focal consolidations. No pneumothorax.  Multiple EKG lines overlie the patient and may obscure subtle underlying pathology. Soft tissue planes and included osseous structures are nonsuspicious.  IMPRESSION: Worsening cardiomegaly, likely related to CHF as there is increasing interstitial edema. However, superimposed  pericardial effusion could have a similar appearance.   Electronically Signed   By: Elon Alas   On: 10/15/2013 06:52   Other results:  EKG: Normal sinus rhythm at 68 beats per minute, normal axis, normal intervals, no LVH by voltage, significant Q waves in II, III, aVF, and V5-V6, diffuse T-wave flattening with pseudonormalization of the T waves in the lateral precordial leads. The pseudonormalization is new compared to the previous ECG in June 2015.  Assessment & Plan by Problem:  [name] is a 68 year old man with history of coronary artery disease status  post coronary artery bypass grafting, grade 2 diastolic heart failure, hypertension, hyperlipidemia, cocaine abuse, and obstructive sleep apnea who presents with chest pain and shortness of breath. On admission he was slightly volume overloaded but this has resolved with diuresis overnight. Although he's had intermittent chest pain since admission, when seen on rounds this morning it had resolved on the nitroglycerin drip. Diagnostically he is a dilemma as he has significant risk factors and known coronary artery disease. Fortunately he has ruled out for myocardial infarction with serial enzymes, but did have some T wave changes, specifically pseudonormalization. He also recently used cocaine which could result in coronary vasospasm. Finally, he also may have noncardiac chest pain given the atypical nature of the pain he describes in the length of time in which it lasted with negative enzymes. A recent Myoview was negative for reversible ischemia.  1) Atypical chest pain: We will wean off the nitroglycerin drip and start him on Imdur as an anti-anginal. This will be added to his calcium channel blocker which is also being used as an antihypertensive and anti-anginal. We are avoiding beta blockers at this time given his heart rate in the low 60s and upper 50s as well as his continuing cocaine abuse. Were he to have significant chest pain we feel benzodiazepines might be a better alternative to help address his unopposed sympathetic activity related to the cocaine abuse. That being said, since he has been admitted he presumably does not have access to cocaine and this should be less of a problem moving forward while he is an inpatient.  2) Grade 2 diastolic heart failure: He has been adequately diuresed and will be switched to his oral diuretic regimen in hopes of maintaining euvolemia.  3) Possible early iron deficiency anemia: He has had recent upper endoscopy but we can find no documentation of recent lower  endoscopy. Given his unexplained anemia he would benefit from further evaluation of his lower GI tract. We will therefore consult GI to consider a colonoscopy as his last colonoscopy in 2012 was marred by a poor prep.  4) Disposition: Disposition is pending the above evaluation and adjustment in therapy. He can be transferred out of the CCU today if he can be weaned off of the nitroglycerin drip and placed on the telemetry unit.

## 2013-10-17 DIAGNOSIS — R059 Cough, unspecified: Secondary | ICD-10-CM

## 2013-10-17 DIAGNOSIS — R05 Cough: Secondary | ICD-10-CM

## 2013-10-17 DIAGNOSIS — I10 Essential (primary) hypertension: Secondary | ICD-10-CM

## 2013-10-17 MED ORDER — AMLODIPINE BESYLATE 10 MG PO TABS
10.0000 mg | ORAL_TABLET | Freq: Every day | ORAL | Status: DC
  Administered 2013-10-18: 10 mg via ORAL
  Filled 2013-10-17: qty 1

## 2013-10-17 MED ORDER — HYDRALAZINE HCL 20 MG/ML IJ SOLN
5.0000 mg | INTRAMUSCULAR | Status: DC | PRN
  Administered 2013-10-17 (×2): 5 mg via INTRAVENOUS
  Filled 2013-10-17 (×2): qty 1

## 2013-10-17 MED ORDER — CYCLOBENZAPRINE HCL 10 MG PO TABS: 5.0000 mg | ORAL_TABLET | Freq: Three times a day (TID) | ORAL | Status: DC | PRN

## 2013-10-17 MED ORDER — FUROSEMIDE 10 MG/ML IJ SOLN
80.0000 mg | Freq: Two times a day (BID) | INTRAMUSCULAR | Status: DC
  Administered 2013-10-17 – 2013-10-18 (×2): 80 mg via INTRAVENOUS
  Filled 2013-10-17 (×5): qty 8

## 2013-10-17 MED ORDER — CLONIDINE HCL 0.3 MG PO TABS
0.3000 mg | ORAL_TABLET | Freq: Two times a day (BID) | ORAL | Status: DC
  Administered 2013-10-17 – 2013-10-18 (×2): 0.3 mg via ORAL
  Filled 2013-10-17 (×3): qty 1

## 2013-10-17 MED ORDER — CLONIDINE HCL 0.2 MG PO TABS
0.2000 mg | ORAL_TABLET | Freq: Two times a day (BID) | ORAL | Status: DC
  Administered 2013-10-17: 0.2 mg via ORAL
  Filled 2013-10-17 (×2): qty 1

## 2013-10-17 MED ORDER — AMLODIPINE BESYLATE 5 MG PO TABS
5.0000 mg | ORAL_TABLET | Freq: Once | ORAL | Status: AC
  Administered 2013-10-17: 5 mg via ORAL
  Filled 2013-10-17 (×2): qty 1

## 2013-10-17 MED ORDER — MORPHINE SULFATE 2 MG/ML IJ SOLN
1.0000 mg | Freq: Once | INTRAMUSCULAR | Status: AC
  Administered 2013-10-17: 1 mg via INTRAVENOUS
  Filled 2013-10-17: qty 1

## 2013-10-17 MED ORDER — CHLORPHENIRAMINE MALEATE 4 MG PO TABS
4.0000 mg | ORAL_TABLET | Freq: Two times a day (BID) | ORAL | Status: DC
  Administered 2013-10-17 – 2013-10-18 (×3): 4 mg via ORAL
  Filled 2013-10-17 (×5): qty 1

## 2013-10-17 NOTE — Progress Notes (Signed)
Pt take off bp cuff at time-explained about important of remaining still for the bp check w/ ntg gtt infusing-admit still having chest tightness at times at the same time wants more food.Still refusing to wear o2.

## 2013-10-17 NOTE — Progress Notes (Signed)
Dr Marvel Plan notified about pt bp still up w/ ntg gtt at 227mcg and some complaints of CP.Pt awake since 8 pm eating crackers/turkey sanwich/cofee.

## 2013-10-17 NOTE — Discharge Summary (Signed)
Name: Charles [name] MRN: [id number] DOB: May 06, 1945 68 y.o. PCP: Charles [name]  Date of Admission: 10/15/2013  6:30 AM Date of Discharge: 10/18/2013 Attending Physician: Karren Cobble, MD  Discharge Diagnosis: Principal Problem:   Chest pain Active Problems:   Hypertension   CAD (coronary artery disease)   Chronic kidney disease (CKD), stage III (moderate)   Seizure disorder   Cocaine abuse   Acute on chronic diastolic heart failure   Anemia, iron deficiency   Dizziness  Discharge Medications:   Medication List    STOP taking these medications       tiZANidine 2 MG tablet  Commonly known as:  ZANAFLEX      TAKE these medications       amLODipine 10 MG tablet  Commonly known as:  NORVASC  Take 1 tablet (10 mg total) by mouth daily.     aspirin EC 81 MG tablet  Take 1 tablet (81 mg total) by mouth daily.     chlorpheniramine 4 MG tablet  Commonly known as:  CHLOR-TRIMETON  Take 1 tablet (4 mg total) by mouth 2 (two) times daily.     citalopram 20 MG tablet  Commonly known as:  CELEXA  Take 20 mg by mouth daily.     cloNIDine 0.3 MG tablet  Commonly known as:  CATAPRES  Take 1 tablet (0.3 mg total) by mouth 2 (two) times daily.     DSS 100 MG Caps  Take 100 mg by mouth 2 (two) times daily.     ferrous sulfate 325 (65 FE) MG tablet  Take 1 tablet (325 mg total) by mouth 3 (three) times daily with meals.     furosemide 80 MG tablet  Commonly known as:  LASIX  Take 1 tablet (80 mg total) by mouth daily.     gabapentin 100 MG capsule  Commonly known as:  NEURONTIN  Take 1 capsule (100 mg total) by mouth 2 (two) times daily.     pantoprazole 40 MG tablet  Commonly known as:  PROTONIX  Take 1 tablet (40 mg total) by mouth 2 (two) times daily.     phenytoin 100 MG ER capsule  Commonly known as:  DILANTIN  Take 2-3 capsules (200-300 mg total) by mouth 2 (two) times daily. Take 2 capsules by mouth in the morning and 3 capsules by mouth at bedtime.      simvastatin 40 MG tablet  Commonly known as:  ZOCOR  Take 1 tablet (40 mg total) by mouth every evening.        Disposition and follow-up:   [name] was discharged from [facility] in Stable condition.  At the hospital follow up visit please address:  1.  Assure medication compliance and that he has abstained from cocaine.   2.  Labs / imaging needed at time of follow-up: BMET for Ct trending.   3.  Pending labs/ test needing follow-up: None  Follow-up Appointments: Follow-up Information   Follow up with Charles [name]. Schedule an appointment as soon as possible for a visit in 3 days.   Specialty:  Family Medicine   Contact information:   [address] [facility] [place] [address] [phone]       Discharge Instructions:   Consultations: Treatment Team:  Rounding Lbcardiology, MD  Procedures Performed:  Dg Chest Port 1 View  10/15/2013   CLINICAL DATA:  Shortness of breath.  EXAM: PORTABLE CHEST -  1 VIEW  COMPARISON:  Chest radiograph Aug 14, 2013  FINDINGS: The cardiac silhouette appears at least moderately enlarged, increased from prior examination. Somewhat globular in appearance. Status post median sternotomy for apparent coronary artery bypass grafting. Increasing pulmonary vascular congestion with mild interstitial prominence. No pleural effusions or focal consolidations. No pneumothorax.  Multiple EKG lines overlie the patient and may obscure subtle underlying pathology. Soft tissue planes and included osseous structures are nonsuspicious.  IMPRESSION: Worsening cardiomegaly, likely related to CHF as there is increasing interstitial edema. However, superimposed pericardial effusion could have a similar appearance.   Electronically Signed   By: Elon Alas   On: 10/15/2013 06:52    Admission HPI: Pt is a 68 yo M with PMH GI bleed, CHF (ECHO 07/16/13 with normal systolic function, EF  44-31%, grade 2 diastolic dysfunction), HTN, CAD s/p CABG, HLD, COPD, previous smoker, cocaine abuse, and sleep apnea presents with acute onset SOB and chest pain.  He states that he was in his normal state of health last night and went to bed as usual, but was awoken at 5am with shortness of breath and 10/10 chest pain. He states that the SOB is intermittent but occurs about 2x month. He states that his chest pain also occurs about 2x month and extends from the right to left mid chest down around his xyphoid process but does not radiate. He states that the pain is sharp and can last for hours to a whole day. Currently his pain is 8/10 on a Nitro drip and after 37m IV Morphine.  He also endorses intermittent peripheral edema, but states that his swelling has been controlled lately. He is unsure of recent weight gain. He denies fevers but endorses a dry cough for a number of months.  He denies tobacco use, but was noted to be a smoker at his last admission in June for CHF exacerbation, and he has a reported h/o COPD. He denies EtOH use, but does smoke cocaine rarely. He states that his roommates are bad influences on him and he wants to move out of the house to a senior living facility.    Hospital Course by problem list:   Chest pain 2/2 to chronic cough in setting of upper airway cough syndrome: Pt was started on nitro drip on admission which was transitioned to imdur 664m However overnight pt squeezing chest pain and he was restarted on nitro drip which was then subsequently transitioned to imdur again. Pt was given ativan 0.76m76mnce for chest pain which resulted on lethargy all day and thus benzos were held. Pt has allergies to motrin and tylenol and thus flexeril TID prn given for chest pain. Pt given chlorpheniramine 4mg84mD for cough. Avoiding beta blocker given recent cocaine use   Acute on chronic diastolic CHF: Pt was given IV 80mg34mix on admission and was transitioned to home lasix 80mg 64mhe  next day. However, volume status remained elevated and he was give IV lasix 80mg I60mD again. Net diuresis close to 2L during this hospitalization.   Dizziness: Likely autonomic dysfunction v medication effect (tizanidine and gabapentin). Orthostatic vitals negative. He reports that this is improving. PT with no HH recommendations, no dizziness/deconditioning/weakness noted per their assessment. We discontinued his Zanaflex. He needs a cane in the outpatient setting.   HTN: BP elevated up to 204/89 during this admission but in the setting of clonidine noncompliance and cocaine use. At home he is on amlodipine 76mg dai13m clonidine 0.2mg dail176mwritten  for BID but he was noncompliant), Lasix 64m daily. BP of 146/72 on the day of his discharge with recently adjusted anti-hypertensives. WE avoided  BB given use of cocaine with +UDS during this hospitalization. We increased his clonidine to 0.322mBID and Norvasc to 1063maily. He will resume his Lasix dose of 59m74m daily upon his discharge. He will follow up with his PCP this week.    Iron deficiency anemia: anemia panel 5/16 revealed iron 16 ferritin 34, baseline Hgb around 8.0. Started on ferrous sulfate 325mg28m with colace for constipation prevention.    CKD stage 3: His baseline creatinine is 1.4-1.5 and remained stable despite increased doses of Lasix.    HLD: We continued his home simvastatin. Lipid panel from 7/29 revealed LDL 42 and HDL 32.  H/o seizures: Stable. He is on Phenytoin at home which was continued on admission.   Cocaine abuse: Pt intermittently using cocaine, last use 7/26. We received brief counseling from several provideders in regards to abstain from cocaine. He met with the social worker in regards to housing options to move away from his current environment.   Discharge Vitals:   BP 168/77  Pulse 63  Temp(Src) 98 F (36.7 C) (Oral)  Resp 18  Ht _0  (1.651 m)  Wt 172 lb 2.9 oz (78.1 kg)  BMI 28.65 kg/m2  SpO2  97%  Discharge Labs:  No results found for this or any previous visit (from the past 24 hour(s)).  Signed: SoliaBlain Pais8/03/2013, 11:05 AM   Services Ordered on Discharge: None Equipment Ordered on Discharge: [employer]

## 2013-10-17 NOTE — Progress Notes (Signed)
Internal Medicine Attending  Date: 10/17/2013  Patient name: Charles [name] Medical record number: [id number] Date of birth: [birth year] Age: 69 y.o. Gender: male  I saw and evaluated the patient. I developed the assessment and plan with the housestaff and reviewed the resident's note by Dr. Hulen Luster.  I agree with the resident's findings and plans as documented in her progress note.  Mr. Rosenberger was much more alert today and able to provide a coherent history. Apparently he's had chest pain for years since he underwent his median sternotomy. The pain seems to be worse with movement and coughing. The pain is also reproducible on examination to chest wall palpation. Of note, he complains of a chronic cough that has been present for least 2 years. He denies any symptoms of postnasal drip or acid reflux. He states he's been compliant with his Protonix, but he is unsure which pill is the Protonix. He is not on an ACE inhibitor and does not smoke cigarettes. Chest x-ray does not have a lesion that could explain the chronic cough. If he is truly been taking the Protonix for more than 3 straight months he should have received some improvement in his chronic cough if it was secondary to reflux. We will therefore continued Protonix but add chlorpheniramine 4 mg by mouth twice daily in hopes of treating an upper airway cough syndrome that may improve his cough, and therefore his concomitant chest wall pain. I feel the pain that he is currently complaining about is secondary to the chest wall pain related to changes from his previous median sternotomy as well as exacerbation from his chronic cough  He did note some orthostatic dizziness for the last several months. We will therefore evaluate this further with orthostatic vital signs. At this point, since I do not believe this is cardiac chest pain, he is stable for transfer out of the CCU to a general medical ward for observation. The nitro drip can be stopped and he  no longer needs telemetry. If he does well after transfer he may be stable for discharge as early as this evening or tomorrow morning.

## 2013-10-17 NOTE — Evaluation (Signed)
Physical Therapy Evaluation Patient Details Name: Charles [name] MRN: [id number] DOB: 09/29/1945 Today's Date: 10/17/2013   History of Present Illness  Pt is a 68 yo M with PMH GI bleed, CHF (ECHO 07/16/13 with normal systolic function, EF 36-64%, grade 2 diastolic dysfunction), HTN, CAD s/p CABG, HLD, COPD, previous smoker, cocaine abuse, and sleep apnea presents with acute onset SOB and chest pain.  Clinical Impression  Patient is functioning at mod I level with mobility and gait.  Patient with slower gait speed due to SOB.  Drifts right and left.  May benefit from cane for stability.  No further acute PT needs identified - PT will sign off.    Follow Up Recommendations No PT follow up;Supervision - Intermittent    Equipment Recommendations  Cane    Recommendations for Other Services       Precautions / Restrictions Precautions Precautions: None Precaution Comments: Reports falling at home Restrictions Weight Bearing Restrictions: No      Mobility  Bed Mobility Overal bed mobility: Independent                Transfers Overall transfer level: Independent Equipment used: None                Ambulation/Gait Ambulation/Gait assistance: Modified independent (Device/Increase time) Ambulation Distance (Feet): 250 Feet Assistive device: None Gait Pattern/deviations: Step-through pattern;Decreased stride length;Drifts right/left Gait velocity: Decreased Gait velocity interpretation: Below normal speed for age/gender General Gait Details: Patient with decreased gait speed - becomes SOB with quicker pace.  Patient drifts right/left.  No loss of balance noted.  Stairs            Wheelchair Mobility    Modified Rankin (Stroke Patients Only)       Balance Overall balance assessment: Modified Independent                           High level balance activites: Turns;Sudden stops;Head turns High Level Balance Comments: No loss of balance  with high level balance activities.             Pertinent Vitals/Pain     Home Living Family/patient expects to be discharged to:: Private residence Living Arrangements: Non-relatives/Friends Available Help at Discharge: Friend(s);Available 24 hours/day Type of Home: House Home Access: Stairs to enter Entrance Stairs-Rails: Psychiatric nurse of Steps: 4 Home Layout: One level Home Equipment: None      Prior Function Level of Independence: Independent               Hand Dominance        Extremity/Trunk Assessment   Upper Extremity Assessment: Overall WFL for tasks assessed           Lower Extremity Assessment: Overall WFL for tasks assessed         Communication   Communication: No difficulties  Cognition Arousal/Alertness: Awake/alert Behavior During Therapy: WFL for tasks assessed/performed Overall Cognitive Status: Within Functional Limits for tasks assessed                      General Comments      Exercises        Assessment/Plan    PT Assessment Patent does not need any further PT services  PT Diagnosis     PT Problem List    PT Treatment Interventions     PT Goals (Current goals can be found in the Care Plan section) Acute Rehab PT Goals  PT Goal Formulation: No goals set, d/c therapy    Frequency     Barriers to discharge        Co-evaluation               End of Session Equipment Utilized During Treatment: Gait belt Activity Tolerance: Patient tolerated treatment well Patient left: in bed;with call bell/phone within reach (sitting EOB) Nurse Communication: Mobility status (Patient ambulating in room on his own)         Time: 3817-7116 PT Time Calculation (min): 22 min   Charges:   PT Evaluation $Initial PT Evaluation Tier I: 1 Procedure PT Treatments $Gait Training: 8-22 mins   PT G CodesDespina Pole 10/17/2013, 8:11 PM  Carita Pian. Sanjuana Kava, [place] Pager [phone]

## 2013-10-17 NOTE — Progress Notes (Signed)
Patient ID: Charles [name], male   DOB: Jul 20, 1945, 68 y.o.   MRN: [id number]   SUBJECTIVE:   Denies chest pain. Denies SOB   Scheduled Meds: . amLODipine  5 mg Oral Daily  . aspirin EC  81 mg Oral Daily  . Chlorhexidine Gluconate Cloth  6 each Topical Q0600  . chlorpheniramine  4 mg Oral BID  . citalopram  20 mg Oral Daily  . cloNIDine  0.2 mg Oral BID  . ferrous sulfate  325 mg Oral TID WC  . furosemide  80 mg Oral Daily  . gabapentin  100 mg Oral BID  . heparin  5,000 Units Subcutaneous 3 times per day  . mupirocin ointment  1 application Nasal BID  . pantoprazole  40 mg Oral BID  . phenytoin  200 mg Oral Daily  . phenytoin  300 mg Oral QHS  . simvastatin  40 mg Oral QPM   Continuous Infusions:  PRN Meds:.cyclobenzaprine, docusate sodium, hydrALAZINE    Filed Vitals:   10/17/13 1015 10/17/13 1045 10/17/13 1300 10/17/13 1400  BP: 181/93 157/67 177/116 186/105  Pulse:      Temp:  98.5 F (36.9 C)    TempSrc:  Oral    Resp:      Height:      Weight:      SpO2:        Intake/Output Summary (Last 24 hours) at 10/17/13 1520 Last data filed at 10/17/13 1000  Gross per 24 hour  Intake 2166.9 ml  Output   1750 ml  Net  416.9 ml    LABS: Basic Metabolic Panel:  Recent Labs  10/15/13 0654 10/15/13 1314 10/16/13 0050  NA 137  --  136*  K 4.6  --  4.2  CL 103  --  101  CO2 20  --  21  GLUCOSE 96  --  93  BUN 24*  --  26*  CREATININE 1.39* 1.36* 1.48*  CALCIUM 8.4  --  7.8*  MG  --  1.9  --    Liver Function Tests: No results found for this basename: AST, ALT, ALKPHOS, BILITOT, PROT, ALBUMIN,  in the last 72 hours No results found for this basename: LIPASE, AMYLASE,  in the last 72 hours CBC:  Recent Labs  10/15/13 0654 10/15/13 1314  WBC 4.7 4.0  HGB 7.9* 8.0*  HCT 25.6* 25.5*  MCV 87.1 87.6  PLT 290 284   Cardiac Enzymes:  Recent Labs  10/15/13 1314 10/15/13 1906 10/16/13 0050  TROPONINI <0.30 <0.30 <0.30   BNP: No components  found with this basename: POCBNP,  D-Dimer: No results found for this basename: DDIMER,  in the last 72 hours Hemoglobin A1C: No results found for this basename: HGBA1C,  in the last 72 hours Fasting Lipid Panel:  Recent Labs  10/15/13 1314  CHOL 88  HDL 32*  LDLCALC 42  TRIG 69  CHOLHDL 2.8   Thyroid Function Tests: No results found for this basename: TSH, T4TOTAL, FREET3, T3FREE, THYROIDAB,  in the last 72 hours Anemia Panel: No results found for this basename: VITAMINB12, FOLATE, FERRITIN, TIBC, IRON, RETICCTPCT,  in the last 72 hours  RADIOLOGY: Dg Chest Port 1 View  10/15/2013   CLINICAL DATA:  Shortness of breath.  EXAM: PORTABLE CHEST - 1 VIEW  COMPARISON:  Chest radiograph Aug 14, 2013  FINDINGS: The cardiac silhouette appears at least moderately enlarged, increased from prior examination. Somewhat globular in appearance. Status post median sternotomy for apparent coronary  artery bypass grafting. Increasing pulmonary vascular congestion with mild interstitial prominence. No pleural effusions or focal consolidations. No pneumothorax.  Multiple EKG lines overlie the patient and may obscure subtle underlying pathology. Soft tissue planes and included osseous structures are nonsuspicious.  IMPRESSION: Worsening cardiomegaly, likely related to CHF as there is increasing interstitial edema. However, superimposed pericardial effusion could have a similar appearance.   Electronically Signed   By: Elon Alas   On: 10/15/2013 06:52    PHYSICAL EXAM General: NAD Neck: JVP ~10 , no thyromegaly or thyroid nodule.  Lungs: Clear to auscultation bilaterally with normal respiratory effort. CV: Nondisplaced PMI.  Heart regular S1/S2, no S3/S4, no murmur.  Trace ankle edema.   Abdomen: Soft, nontender, no hepatosplenomegaly, no distention.  Neurologic:Sleeping  Extremities: No clubbing or cyanosis. R and LLE 2+ edema.    TELEMETRY: Reviewed telemetry pt in NSR  ASSESSMENT AND  PLAN: 68 yo with history of CAD s/p CABG and diastolic CHF was admitted with chest pain, acute/chronic diastolic CHF, and recent cocaine use.  1. CAD: h/o CABG.  Cardiolite in 12/14 with scar, no ischemia.  Currently no chest pain.  Troponin negative x 3.  It is possible that this was cocaine-related chest pain (vasospasm).  - Continue amlodipine and Imdur (increase to 60 mg daily), would avoid beta blockers with active cocaine use (UDS was positive this admission).   - If no further chest pain, would not repeat Cardiolite. CP resolved.  2. Acute on chronic diastolic CHF: Volume status remains elevated. Continue IV lasix for at least another 24 hours.  Consult cardiac rehab. Add ted hose 3. CKD: stable.  4. Uncontrolled HTN- Give an extra 5 mg amlodipine now. Increase amlodipine to 10 mg daily and increase clonidine to 0.3 mg twice a day.    CLEGG,AMY NP-C  10/17/2013  Patient seen and examined with Darrick Grinder, NP. We discussed all aspects of the encounter. I agree with the assessment and plan as stated above.   No further CP. CE remain negative. Would not repeat Myoview at this point. Needs more diuresis. Titrate anti-HTN regimen.   Benay Spice 3:35 PM

## 2013-10-17 NOTE — Progress Notes (Addendum)
Subjective:  Pt is more alert and able to provide more information regarding chest pain which he reports is exacerbated by his chronic coughing. He states it is a shooting pain from mid lt chest down to his xiphoid process and out towards his breast and is located where his previous cardiac surgery scars are. It is also associated with a burning sensation under sternal area. Patient can point to area of pain and is sensitive to palpation of area. He denies any sensation of post nasal drip. He denies hx of acid reflux although he is on protonix at home. He states compliance with medications but was not specifically sure if he was taking protonix. He takes clonidine twice a day at home and has been on it once a day while admitted. He denies HA or difficulty breathing. His dizziness has improved since yesterday and he endorses symptoms of orthostatic hypotension.   Overnight, pt had elevated BP up to SBP of 200's and was c/o chest tightness, night team restarted nitro gtt. Currently pain is 8/10 increased from 6/10 when seen around 8:00am.   Objective: Vital signs in last 24 hours: Filed Vitals:   10/17/13 0700 10/17/13 0730 10/17/13 0800 10/17/13 0930  BP: 200/96 187/83 189/78 188/73  Pulse:      Temp: 97.9 F (36.6 C)     TempSrc: Oral     Resp: 22 19    Height:      Weight:      SpO2: 98%      Weight change: -6 lb 9.8 oz (-3 kg)  Intake/Output Summary (Last 24 hours) at 10/17/13 1131 Last data filed at 10/17/13 1000  Gross per 24 hour  Intake 2384.9 ml  Output   2650 ml  Net -265.1 ml   BP 188/73  Pulse 61  Temp(Src) 97.9 F (36.6 C) (Oral)  Resp 19  Ht 5\' 5"  (1.651 m)  Wt 170 lb 13.7 oz (77.5 kg)  BMI 28.43 kg/m2  SpO2 98%  General Appearance:   alert, NAD  Lungs:   CTAB  Chest wall:    Tender to palpation of rt and left chest wall  Heart:    Regular rate and rhythm, S1 and S2 normal, no murmur, rub   or gallop  Abdomen: Neuro     Soft, non-tender, bowel sounds  active   Unsteady while performing romberg's   Lab Results: Basic Metabolic Panel:  Recent Labs Lab 10/15/13 0654 10/15/13 1314 10/16/13 0050  NA 137  --  136*  K 4.6  --  4.2  CL 103  --  101  CO2 20  --  21  GLUCOSE 96  --  93  BUN 24*  --  26*  CREATININE 1.39* 1.36* 1.48*  CALCIUM 8.4  --  7.8*  MG  --  1.9  --    CBC:  Recent Labs Lab 10/15/13 0654 10/15/13 1314  WBC 4.7 4.0  HGB 7.9* 8.0*  HCT 25.6* 25.5*  MCV 87.1 87.6  PLT 290 284   Cardiac Enzymes:  Recent Labs Lab 10/15/13 1314 10/15/13 1906 10/16/13 0050  TROPONINI <0.30 <0.30 <0.30   BNP:  Recent Labs Lab 10/15/13 0654  PROBNP 1973.0*   Fasting Lipid Panel:  Recent Labs Lab 10/15/13 1314  CHOL 88  HDL 32*  LDLCALC 42  TRIG 69  CHOLHDL 2.8   Urine Drug Screen: Drugs of Abuse     Component Value Date/Time   LABOPIA NONE DETECTED 10/15/2013 0914   COCAINSCRNUR  POSITIVE* 10/15/2013 0914   LABBENZ NONE DETECTED 10/15/2013 0914   AMPHETMU NONE DETECTED 10/15/2013 0914   [place] DETECTED 10/15/2013 0914   LABBARB NONE DETECTED 10/15/2013 4098    Micro Results: Recent Results (from the past 240 hour(s))  MRSA PCR SCREENING     Status: Abnormal   Collection Time    10/15/13  4:08 PM      Result Value Ref Range Status   MRSA by PCR POSITIVE (*) NEGATIVE Final   Comment:            The GeneXpert MRSA Assay (FDA     approved for NASAL specimens     only), is one component of a     comprehensive MRSA colonization     surveillance program. It is not     intended to diagnose MRSA     infection nor to guide or     monitor treatment for     MRSA infections.     RESULT CALLED TO, READ BACK BY AND VERIFIED WITH:     [place] RN 19:00 10/15/13 ([email or web address])   Studies/Results: No results found. Medications: I have reviewed the patient's current medications. Scheduled Meds: . amLODipine  5 mg Oral Daily  . aspirin EC  81 mg Oral Daily  . Chlorhexidine Gluconate Cloth  6 each Topical Q0600   . citalopram  20 mg Oral Daily  . cloNIDine  0.2 mg Oral BID  . ferrous sulfate  325 mg Oral TID WC  . furosemide  80 mg Oral Daily  . gabapentin  100 mg Oral BID  . heparin  5,000 Units Subcutaneous 3 times per day  . mupirocin ointment  1 application Nasal BID  . pantoprazole  40 mg Oral BID  . phenytoin  200 mg Oral Daily  . phenytoin  300 mg Oral QHS  . simvastatin  40 mg Oral QPM   Continuous Infusions: . nitroGLYCERIN 200 mcg/min (10/17/13 0915)   PRN Meds:.docusate sodium Assessment/Plan:  Chest pain 2/2 to chronic cough in setting of upper airway cough syndrome: - ween off nitro gtt and transition to po imdur - increased Clonidine to BID which is his home dose - continue amlodipine and aspirin  - Avoiding beta blocker given recent cocaine use  - pt has allergies to motrin and tylenol, will give flexeril TID prn for chest pain and will hold if pt is lethargic - per chart review pt was on bidil 20/37.5 in June, however seems to have fallen off med list. Not seen on f/u heart failure note. Will consider restarting along with po nitrate on d/c - pt is not on an ACEinh, will give chlorpheniramine 4mg  BID for cough - transfer to medsurg and d/c tele as chest pain likely not cardiac related - will monitor while off of nitro gtt and likely patient can go home tomorrow.   Acute on chronic diastolic CHF:  - weight today is 170lbs down from 171lbs yesterday - will continue home lasix 80mg  once a day  Possible early iron deficiency anemia: anemia panel 5/16 revealed iron 16 ferritin 34, baseline Hgb around 8.0.  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 Cr on 7/30 was 1.48 which is around his baseline of 1.4-1.5.  - pt had elevated BP's overnight, likely 2/2 to pain or rebound htn from decreasing home clonidine  dose by half. Will resume clonidine BID and monitor.  - orthostatic vitals negative, continuing home meds    HLD: Pt on simvastatin at home. Lipid panel today revealed LDL 42 and HDL 32 - Continue statin   H/o seizures: Stable. Pt on Phenytoin at home which was continued on admission.   Cocaine abuse: Pt intermittently using cocaine, last use Sunday.  - SW for drug counseling   DVT PPx: [place] Heparin   Dispo: Disposition is deferred at this time, awaiting improvement of current medical problems. Anticipated discharge in approximately 1-3 day(s).   The patient does have a current PCP [name]) and does not need an [facility] hospital follow-up appointment after discharge.   The patient does have transportation limitations that hinder transportation to clinic appointments.    LOS: 2 days   Julious Oka, MD 10/17/2013, 11:31 AM

## 2013-10-18 DIAGNOSIS — I739 Peripheral vascular disease, unspecified: Secondary | ICD-10-CM

## 2013-10-18 DIAGNOSIS — I201 Angina pectoris with documented spasm: Secondary | ICD-10-CM

## 2013-10-18 DIAGNOSIS — R42 Dizziness and giddiness: Secondary | ICD-10-CM

## 2013-10-18 LAB — BASIC METABOLIC PANEL
Anion gap: 13 (ref 5–15)
BUN: 17 mg/dL (ref 6–23)
CALCIUM: 8.8 mg/dL (ref 8.4–10.5)
CO2: 26 meq/L (ref 19–32)
Chloride: 96 mEq/L (ref 96–112)
Creatinine, Ser: 1.35 mg/dL (ref 0.50–1.35)
GFR calc Af Amer: 60 mL/min — ABNORMAL LOW (ref >90)
GFR calc non Af Amer: 52 mL/min — ABNORMAL LOW (ref >90)
GLUCOSE: 87 mg/dL (ref 70–99)
Potassium: 3.7 mEq/L (ref 3.7–5.3)
SODIUM: 135 meq/L — AB (ref 137–147)

## 2013-10-18 MED ORDER — ISOSORBIDE MONONITRATE ER 30 MG PO TB24
30.0000 mg | ORAL_TABLET | Freq: Every day | ORAL | Status: DC
  Administered 2013-10-18: 30 mg via ORAL
  Filled 2013-10-18: qty 1

## 2013-10-18 MED ORDER — FERROUS SULFATE 325 (65 FE) MG PO TABS: 325.0000 mg | ORAL_TABLET | Freq: Three times a day (TID) | ORAL | Status: DC

## 2013-10-18 MED ORDER — CLONIDINE HCL 0.3 MG PO TABS: 0.3000 mg | ORAL_TABLET | Freq: Two times a day (BID) | ORAL | Status: DC

## 2013-10-18 MED ORDER — FUROSEMIDE 80 MG PO TABS: 80.0000 mg | ORAL_TABLET | Freq: Every day | ORAL | Status: DC

## 2013-10-18 MED ORDER — AMLODIPINE BESYLATE 10 MG PO TABS: 10.0000 mg | ORAL_TABLET | Freq: Every day | ORAL | Status: DC

## 2013-10-18 MED ORDER — DSS 100 MG PO CAPS: 100.0000 mg | ORAL_CAPSULE | Freq: Two times a day (BID) | ORAL | Status: DC

## 2013-10-18 MED ORDER — OXYCODONE HCL 5 MG PO TABS
5.0000 mg | ORAL_TABLET | Freq: Once | ORAL | Status: AC
  Administered 2013-10-18: 5 mg via ORAL
  Filled 2013-10-18: qty 1

## 2013-10-18 MED ORDER — CHLORPHENIRAMINE MALEATE 4 MG PO TABS: 4.0000 mg | ORAL_TABLET | Freq: Two times a day (BID) | ORAL | Status: DC

## 2013-10-18 NOTE — Progress Notes (Addendum)
CARDIAC REHAB PHASE I   PRE:  Rate/Rhythm: 61 not on telemtry  BP:  Supine:   Sitting: 150/80  Standing:    SaO2: 98 RA  MODE:  Ambulation: 550 ft   POST:  Rate/Rhythm: 66  BP:  Supine:   Sitting: 152/80  Standing:    SaO2: 96 RA 1050-1200 On arrival pt up in room at sink shaving. Completed CHF education with pt. I gave him. I gave him CHF packet. We discussed CHF zones, daily weights,sodium and fluid restrictions and when to call MD and 911. He states that his living situation is not helping his medical conditions. He states that there is drug use and smoking there. I encouraged him to  make the decision that is right for him, regardless what other chose to do. We discussed smoking cessation. He understands that he needs to quit and voices that he does want to. I gave him tips for quitting, coaching contact number and quit smart class information. He is waiting for social worker to see him. She knows about him and he is on her list to see today. Pt needs help with making better decisions and needs a positive living environment. I stressed important's of medication compliance. I doubt his compliance with lifestyle changes. Pt tolerated ambulation well using walker. VS stable. He took one standing rest stop in 550 foot walk. He c/o of SOB with walking and of calf and leg pain, both of which he has at home walking. Rodney Langton RN 10/18/2013 11:54 AM

## 2013-10-18 NOTE — Discharge Instructions (Signed)

## 2013-10-18 NOTE — Progress Notes (Signed)
Charles [name] to be D/C'd Home per MD order.  Discussed with the patient and all questions fully answered.    Medication List    STOP taking these medications       tiZANidine 2 MG tablet  Commonly known as:  ZANAFLEX      TAKE these medications       amLODipine 10 MG tablet  Commonly known as:  NORVASC  Take 1 tablet (10 mg total) by mouth daily.     aspirin EC 81 MG tablet  Take 1 tablet (81 mg total) by mouth daily.     chlorpheniramine 4 MG tablet  Commonly known as:  CHLOR-TRIMETON  Take 1 tablet (4 mg total) by mouth 2 (two) times daily.     citalopram 20 MG tablet  Commonly known as:  CELEXA  Take 20 mg by mouth daily.     cloNIDine 0.3 MG tablet  Commonly known as:  CATAPRES  Take 1 tablet (0.3 mg total) by mouth 2 (two) times daily.     DSS 100 MG Caps  Take 100 mg by mouth 2 (two) times daily.     ferrous sulfate 325 (65 FE) MG tablet  Take 1 tablet (325 mg total) by mouth 3 (three) times daily with meals.     furosemide 80 MG tablet  Commonly known as:  LASIX  Take 1 tablet (80 mg total) by mouth daily.     gabapentin 100 MG capsule  Commonly known as:  NEURONTIN  Take 1 capsule (100 mg total) by mouth 2 (two) times daily.     pantoprazole 40 MG tablet  Commonly known as:  PROTONIX  Take 1 tablet (40 mg total) by mouth 2 (two) times daily.     phenytoin 100 MG ER capsule  Commonly known as:  DILANTIN  Take 2-3 capsules (200-300 mg total) by mouth 2 (two) times daily. Take 2 capsules by mouth in the morning and 3 capsules by mouth at bedtime.     simvastatin 40 MG tablet  Commonly known as:  ZOCOR  Take 1 tablet (40 mg total) by mouth every evening.        VVS, Skin clean, dry and intact without evidence of skin break down, no evidence of skin tears noted. IV catheter discontinued intact. Site without signs and symptoms of complications. Dressing and pressure applied.  An After Visit Summary was printed and given to the patient.  D/c  education completed with patient/family including follow up instructions, medication list, d/c activities limitations if indicated, with other d/c instructions as indicated by MD - patient able to verbalize understanding, all questions fully answered.   Patient instructed to return to ED, call 911, or call MD for any changes in condition.   Patient escorted via [place], and D/C home via taxi.  Audria Nine F 10/18/2013 5:00 PM

## 2013-10-18 NOTE — Progress Notes (Signed)
SUBJECTIVE: Says chest pain much improved since admission. Says breathing is "about the same as yesterday". Wants to get outpatient sleep study for CPAP.      Intake/Output Summary (Last 24 hours) at 10/18/13 1027 Last data filed at 10/18/13 1610  Gross per 24 hour  Intake    720 ml  Output    250 ml  Net    470 ml    Current Facility-Administered Medications  Medication Dose Route Frequency Provider Last Rate Last Dose  . amLODipine (NORVASC) tablet 10 mg  10 mg Oral Daily Amy D Clegg, NP      . aspirin EC tablet 81 mg  81 mg Oral Daily Otho Bellows, MD   81 mg at 10/17/13 0910  . Chlorhexidine Gluconate Cloth 2 % PADS 6 each  6 each Topical Q0600 Karren Cobble, MD   6 each at 10/18/13 0600  . chlorpheniramine (CHLOR-TRIMETON) tablet 4 mg  4 mg Oral BID Karren Cobble, MD   4 mg at 10/17/13 2133  . citalopram (CELEXA) tablet 20 mg  20 mg Oral Daily Otho Bellows, MD   20 mg at 10/17/13 0910  . cloNIDine (CATAPRES) tablet 0.3 mg  0.3 mg Oral BID Conrad Freeland, NP   0.3 mg at 10/17/13 2133  . cyclobenzaprine (FLEXERIL) tablet 5 mg  5 mg Oral TID PRN Julious Oka, MD      . docusate sodium (COLACE) capsule 100 mg  100 mg Oral Daily PRN Julious Oka, MD      . ferrous sulfate tablet 325 mg  325 mg Oral TID WC Otho Bellows, MD   325 mg at 10/18/13 0819  . furosemide (LASIX) injection 80 mg  80 mg Intravenous BID Conrad Kahaluu, NP   80 mg at 10/18/13 0820  . gabapentin (NEURONTIN) capsule 100 mg  100 mg Oral BID Otho Bellows, MD   100 mg at 10/17/13 2133  . heparin injection 5,000 Units  5,000 Units Subcutaneous 3 times per day Otho Bellows, MD   5,000 Units at 10/18/13 0600  . hydrALAZINE (APRESOLINE) injection 5 mg  5 mg Intravenous PRN Julious Oka, MD   5 mg at 10/17/13 2142  . mupirocin ointment (BACTROBAN) 2 % 1 application  1 application Nasal BID Karren Cobble, MD   1 application at 96/04/54 2254  . pantoprazole (PROTONIX) EC tablet 40 mg  40 mg Oral BID  Otho Bellows, MD   40 mg at 10/17/13 2141  . phenytoin (DILANTIN) ER capsule 200 mg  200 mg Oral Daily Karren Cobble, MD   200 mg at 10/17/13 0910  . phenytoin (DILANTIN) ER capsule 300 mg  300 mg Oral QHS Karren Cobble, MD   300 mg at 10/17/13 2133  . simvastatin (ZOCOR) tablet 40 mg  40 mg Oral QPM Otho Bellows, MD   40 mg at 10/17/13 1642    Filed Vitals:   10/17/13 1844 10/17/13 2108 10/18/13 0045 10/18/13 0404  BP: 180/94 190/87 143/74 146/72  Pulse:  64 63 63  Temp:  98.2 F (36.8 C)  98 F (36.7 C)  TempSrc:  Oral  Oral  Resp:  18 18 18   Height:      Weight:    172 lb 2.9 oz (78.1 kg)  SpO2:  97% 97%     PHYSICAL EXAM General: NAD  Neck: JVP ~10 , no thyromegaly or thyroid nodule.  Lungs:  Diminished b/l, no rales or wheezes. CV: Nondisplaced PMI. Heart regular S1/S2, no S3/S4, no murmur. Trace ankle edema.  Abdomen: Soft, nontender, no hepatosplenomegaly, no distention.  Neurologic:Sleeping  Extremities: No clubbing or cyanosis. R and LLE trace edema.     LABS: Basic Metabolic Panel:  Recent Labs  10/15/13 1314 10/16/13 0050  NA  --  136*  K  --  4.2  CL  --  101  CO2  --  21  GLUCOSE  --  93  BUN  --  26*  CREATININE 1.36* 1.48*  CALCIUM  --  7.8*  MG 1.9  --    Liver Function Tests: No results found for this basename: AST, ALT, ALKPHOS, BILITOT, PROT, ALBUMIN,  in the last 72 hours No results found for this basename: LIPASE, AMYLASE,  in the last 72 hours CBC:  Recent Labs  10/15/13 1314  WBC 4.0  HGB 8.0*  HCT 25.5*  MCV 87.6  PLT 284   Cardiac Enzymes:  Recent Labs  10/15/13 1314 10/15/13 1906 10/16/13 0050  TROPONINI <0.30 <0.30 <0.30   BNP: No components found with this basename: POCBNP,  D-Dimer: No results found for this basename: DDIMER,  in the last 72 hours Hemoglobin A1C: No results found for this basename: HGBA1C,  in the last 72 hours Fasting Lipid Panel:  Recent Labs  10/15/13 1314  CHOL 88  HDL 32*    LDLCALC 42  TRIG 69  CHOLHDL 2.8   Thyroid Function Tests: No results found for this basename: TSH, T4TOTAL, FREET3, T3FREE, THYROIDAB,  in the last 72 hours Anemia Panel: No results found for this basename: VITAMINB12, FOLATE, FERRITIN, TIBC, IRON, RETICCTPCT,  in the last 72 hours  RADIOLOGY: Dg Chest Port 1 View  10/15/2013   CLINICAL DATA:  Shortness of breath.  EXAM: PORTABLE CHEST - 1 VIEW  COMPARISON:  Chest radiograph Aug 14, 2013  FINDINGS: The cardiac silhouette appears at least moderately enlarged, increased from prior examination. Somewhat globular in appearance. Status post median sternotomy for apparent coronary artery bypass grafting. Increasing pulmonary vascular congestion with mild interstitial prominence. No pleural effusions or focal consolidations. No pneumothorax.  Multiple EKG lines overlie the patient and may obscure subtle underlying pathology. Soft tissue planes and included osseous structures are nonsuspicious.  IMPRESSION: Worsening cardiomegaly, likely related to CHF as there is increasing interstitial edema. However, superimposed pericardial effusion could have a similar appearance.   Electronically Signed   By: Elon Alas   On: 10/15/2013 06:52      ASSESSMENT AND PLAN: 68 yo with history of CAD s/p CABG and diastolic CHF was admitted with chest pain, acute/chronic diastolic CHF, and recent cocaine use.   1. CAD: h/o CABG. Cardiolite in 12/14 with scar, no ischemia. Currently has improved chest pain, likely related to prior sternotomy. Troponin negative x 3. It is possible that this was cocaine-related chest pain as well (vasospasm).  - Continue ASA, statin, amlodipine and will add Imdur (appears suggested yesterday but never started). Continue to avoid beta blockers with active cocaine use (UDS was positive this admission).  -At this point, would not repeat Cardiolite as there is certainly a musculoskeletal component involved as well.   2. Acute on  chronic diastolic CHF: Volume status is only mildly elevated. As per documented I/O's, no significant output. Continue IV Lasix for now and check BMET.   3. CKD: Check BMET today.  4. Uncontrolled HTN- Better controlled now. Continue amlodipine 10 mg daily and clonidine to  0.3 mg twice a day.     Kate Sable, M.D., F.A.C.C.

## 2013-10-18 NOTE — Progress Notes (Addendum)
Subjective: His cough sis not improve with chlorthalidone, his cough is worse with lying down. He still has chest wall tenderness but no pain.  Objective: Vital signs in last 24 hours: Filed Vitals:   10/17/13 1844 10/17/13 2108 10/18/13 0045 10/18/13 0404  BP: 180/94 190/87 143/74 146/72  Pulse:  64 63 63  Temp:  98.2 F (36.8 C)  98 F (36.7 C)  TempSrc:  Oral  Oral  Resp:  18 18 18   Height:      Weight:    172 lb 2.9 oz (78.1 kg)  SpO2:  97% 97%    Weight change: 10.6 oz (0.3 kg)  Intake/Output Summary (Last 24 hours) at 10/18/13 1003 Last data filed at 10/18/13 0924  Gross per 24 hour  Intake    720 ml  Output    250 ml  Net    470 ml   Vitals reviewed. General: Sitting in bed, in NAD HEENT:  no scleral icterus Cardiac: RRR, no rubs, murmurs or gallops Chest Wall: TTP in right and left chest wall Pulm: clear to auscultation bilaterally, no wheezes, rales, or rhonchi Abd: soft, nontender, nondistended, BS present Ext: warm and well perfused, trace edema bilaterally up to his knees Neuro: alert and oriented X3, following commands appropriately, moves all extremities voluntarily  Lab Results: Basic Metabolic Panel:  Recent Labs Lab 10/15/13 0654 10/15/13 1314 10/16/13 0050  NA 137  --  136*  K 4.6  --  4.2  CL 103  --  101  CO2 20  --  21  GLUCOSE 96  --  93  BUN 24*  --  26*  CREATININE 1.39* 1.36* 1.48*  CALCIUM 8.4  --  7.8*  MG  --  1.9  --    CBC:  Recent Labs Lab 10/15/13 0654 10/15/13 1314  WBC 4.7 4.0  HGB 7.9* 8.0*  HCT 25.6* 25.5*  MCV 87.1 87.6  PLT 290 284   Cardiac Enzymes:  Recent Labs Lab 10/15/13 1314 10/15/13 1906 10/16/13 0050  TROPONINI <0.30 <0.30 <0.30   BNP:  Recent Labs Lab 10/15/13 0654  PROBNP 1973.0*   Fasting Lipid Panel:  Recent Labs Lab 10/15/13 1314  CHOL 88  HDL 32*  LDLCALC 42  TRIG 69  CHOLHDL 2.8   Urine Drug Screen: Drugs of Abuse     Component Value Date/Time   LABOPIA NONE  DETECTED 10/15/2013 0914   COCAINSCRNUR POSITIVE* 10/15/2013 0914   LABBENZ NONE DETECTED 10/15/2013 0914   AMPHETMU NONE DETECTED 10/15/2013 0914   THCU NONE DETECTED 10/15/2013 0914   LABBARB NONE DETECTED 10/15/2013 0914     Micro Results: Recent Results (from the past 240 hour(s))  MRSA PCR SCREENING     Status: Abnormal   Collection Time    10/15/13  4:08 PM      Result Value Ref Range Status   MRSA by PCR POSITIVE (*) NEGATIVE Final   Comment:            The GeneXpert MRSA Assay (FDA     approved for NASAL specimens     only), is one component of a     comprehensive MRSA colonization     surveillance program. It is not     intended to diagnose MRSA     infection nor to guide or     monitor treatment for     MRSA infections.     RESULT CALLED TO, READ BACK BY AND VERIFIED WITH:  [place] RN 19:00 10/15/13 ([email or web address])   Studies/Results: No results found. Medications: I have reviewed the patient's current medications. Scheduled Meds: . amLODipine  10 mg Oral Daily  . aspirin EC  81 mg Oral Daily  . Chlorhexidine Gluconate Cloth  6 each Topical Q0600  . chlorpheniramine  4 mg Oral BID  . citalopram  20 mg Oral Daily  . cloNIDine  0.3 mg Oral BID  . ferrous sulfate  325 mg Oral TID WC  . furosemide  80 mg Intravenous BID  . gabapentin  100 mg Oral BID  . heparin  5,000 Units Subcutaneous 3 times per day  . mupirocin ointment  1 application Nasal BID  . pantoprazole  40 mg Oral BID  . phenytoin  200 mg Oral Daily  . phenytoin  300 mg Oral QHS  . simvastatin  40 mg Oral QPM   Continuous Infusions:  PRN Meds:.cyclobenzaprine, docusate sodium, hydrALAZINE Assessment/Plan: 68 yo with history of CAD s/p CABG and diastolic CHF was admitted with chest pain, acute/chronic diastolic CHF, and recent cocaine use.   Chest pain 2/2 to chronic cough in setting of upper airway cough syndrome: Off nitro drip with no more chest pain. BP well controlled with adjusted anti-hypertensive  doses.  - Avoiding beta blocker given recent cocaine use  - Discontinue flexeril TID PRN for chest wall pain -pt states that the pais is better - Continue Imdur 30mg   24 hr tablet - Continue chlorpheniramine 4mg  BID for cough  - Ongoing cocaine cessation counseling  Acute on chronic diastolic CHF: Weight of 956 today (different scale from yesterday). HF Team following, recommend continued diuresis -Discontinue Lasix 80mg  BID IV -Start Lasix 80mg  PO daily -BMET check today  Dizziness: Likely autonomic dysfunction v medication effect (tizanidine and gabapentin). Orthostatic vitals negative. He reports that this is improving. PT with no HH recommendations, no dizziness/deconditioning/weakness noted per their assessment.  -Zanaflex discontinued.  -Case management consult, pt needs a cane  HTN: BP elevated up to 204/89 during this admission but in the setting of clonidine noncompliance and cocaine use. At home he is on amlodipine 5mg  daily, clonidine 0.2mg  daily (written for BID but he was noncompliant), Lasix 80mg  daily. BP of 146/72 this morning with recently adjusted anti-hypertensives. Will avoid BB given use of cocaine with +UDS during this hospitalization.  -Continue clonidine 0.3mg  BID -Reduce Lasix 80mg  to once daily -Continue Norvasc 10mg  daily  Iron deficiency anemia: anemia panel 5/16 revealed iron 16 ferritin 34, baseline Hgb around 8.0.  -ferrous sulfate 325mg  TID  - colace prn   CKD stage 3: Baseline Cr of 1.4-1.5. Has been stable at 1.48 with increased dose of Lasix.  -Check BMET today  HLD: Pt on simvastatin at home. Lipid panel 7/29 revealed LDL 42 and HDL 32  - Continue statin   H/o seizures: Stable. Pt on Phenytoin at home which was continued on admission.   Cocaine abuse: Pt intermittently using cocaine, last use 7/26.  - counseled patient on abstaining from cocaine  - case manager for housing options--pt wants to move to avoid being around "old friends".   DVT  PPx: [place] Heparin   Dispo: Disposition is deferred at this time, awaiting improvement of current medical problems. Anticipated discharge in approximately 1-3 day(s).   The patient does have a current PCP [name]) and does not need an [facility] hospital follow-up appointment after discharge. Anticipated discharge is today.   The patient does have transportation limitations that hinder transportation to clinic appointments.    [name]  Services Needed at time of discharge: Y = Yes, Blank = No PT:   OT:   RN:   Equipment:   Other: Cane    LOS: 3 days   Blain Pais, MD 10/18/2013, 10:03 AM

## 2013-10-18 NOTE — Clinical Social Work Note (Addendum)
2:23pm- CSW spoke with RN who confirms that pt (with assistance from RN) has exhausted all means/possibilities of transportation home.  CSW received approval for taxi voucher for one time only.  RN confirmed address: [address] [place], [place].  CSW completed taxi voucher.  RN to arrange pick-up.   1:32pm- CSW was contacted for possible transportation home.  Pt lives in [place].  CSW can provide bus pass within the [place] limits.  Pt was informed.  Pt has a friend listed on the facesheet and was encouraged to contact this friend to brainstorm how the pt was to be transported to [employer].  Pt is agreeable to this plan.  Nonnie Done, [place] [phone] Clinical Social Work

## 2013-10-22 NOTE — Discharge Summary (Signed)
Upon my interview of Mr. Lankford it was discovered that he has a chronic cough.  This cough exacerbated his chest pain, which was also reproducible on anterior chest wall palpation.  Therefore, he was felt to have musculoskeletal chest wall pain exacerbated by his chronic cough.  He denies any reflux symptoms and states that he has been compliant with his protonix for > 3 months, although we have many reasons to believe this history may not be totally accurate.  He also denies a sensation of a post nasal drip.  He was continued on the PPI therapy and reminded to be compliant with his prescribed medications.  He was also started on chlorpheniramine for a possible upper airway cough syndrome as a cause of the chronic cough.  The response to this therapy will have to be reassessed at outpatient follow-up.

## 2013-10-23 ENCOUNTER — Observation Stay (HOSPITAL_COMMUNITY)
Admission: EM | Admit: 2013-10-23 | Discharge: 2013-10-23 | Disposition: A | Discharge status: Home / Self Care | Payer: Medicare Other | Attending: Internal Medicine | Admitting: Internal Medicine

## 2013-10-23 ENCOUNTER — Emergency Department (HOSPITAL_COMMUNITY): Payer: Medicare Other

## 2013-10-23 ENCOUNTER — Encounter (HOSPITAL_COMMUNITY): Payer: Self-pay | Admitting: Emergency Medicine

## 2013-10-23 DIAGNOSIS — E785 Hyperlipidemia, unspecified: Secondary | ICD-10-CM | POA: Insufficient documentation

## 2013-10-23 DIAGNOSIS — I2589 Other forms of chronic ischemic heart disease: Secondary | ICD-10-CM | POA: Diagnosis not present

## 2013-10-23 DIAGNOSIS — M549 Dorsalgia, unspecified: Secondary | ICD-10-CM | POA: Diagnosis not present

## 2013-10-23 DIAGNOSIS — I2510 Atherosclerotic heart disease of native coronary artery without angina pectoris: Secondary | ICD-10-CM | POA: Diagnosis not present

## 2013-10-23 DIAGNOSIS — Z91199 Patient's noncompliance with other medical treatment and regimen due to unspecified reason: Secondary | ICD-10-CM

## 2013-10-23 DIAGNOSIS — G4733 Obstructive sleep apnea (adult) (pediatric): Secondary | ICD-10-CM | POA: Insufficient documentation

## 2013-10-23 DIAGNOSIS — Z9119 Patient's noncompliance with other medical treatment and regimen: Secondary | ICD-10-CM

## 2013-10-23 DIAGNOSIS — Z79899 Other long term (current) drug therapy: Secondary | ICD-10-CM | POA: Insufficient documentation

## 2013-10-23 DIAGNOSIS — Z7982 Long term (current) use of aspirin: Secondary | ICD-10-CM | POA: Diagnosis not present

## 2013-10-23 DIAGNOSIS — Z9989 Dependence on other enabling machines and devices: Secondary | ICD-10-CM | POA: Diagnosis not present

## 2013-10-23 DIAGNOSIS — Z91148 Patient's other noncompliance with medication regimen for other reason: Secondary | ICD-10-CM

## 2013-10-23 DIAGNOSIS — I5040 Unspecified combined systolic (congestive) and diastolic (congestive) heart failure: Secondary | ICD-10-CM | POA: Diagnosis present

## 2013-10-23 DIAGNOSIS — R071 Chest pain on breathing: Secondary | ICD-10-CM | POA: Diagnosis not present

## 2013-10-23 DIAGNOSIS — Z5900 Homelessness unspecified: Secondary | ICD-10-CM | POA: Insufficient documentation

## 2013-10-23 DIAGNOSIS — R569 Unspecified convulsions: Secondary | ICD-10-CM | POA: Diagnosis not present

## 2013-10-23 DIAGNOSIS — N183 Chronic kidney disease, stage 3 unspecified: Secondary | ICD-10-CM | POA: Insufficient documentation

## 2013-10-23 DIAGNOSIS — F17200 Nicotine dependence, unspecified, uncomplicated: Secondary | ICD-10-CM | POA: Diagnosis not present

## 2013-10-23 DIAGNOSIS — I25119 Atherosclerotic heart disease of native coronary artery with unspecified angina pectoris: Secondary | ICD-10-CM

## 2013-10-23 DIAGNOSIS — I509 Heart failure, unspecified: Secondary | ICD-10-CM | POA: Diagnosis not present

## 2013-10-23 DIAGNOSIS — Z951 Presence of aortocoronary bypass graft: Secondary | ICD-10-CM | POA: Insufficient documentation

## 2013-10-23 DIAGNOSIS — I129 Hypertensive chronic kidney disease with stage 1 through stage 4 chronic kidney disease, or unspecified chronic kidney disease: Secondary | ICD-10-CM | POA: Diagnosis not present

## 2013-10-23 DIAGNOSIS — Z9114 Patient's other noncompliance with medication regimen: Secondary | ICD-10-CM

## 2013-10-23 DIAGNOSIS — D509 Iron deficiency anemia, unspecified: Secondary | ICD-10-CM | POA: Diagnosis not present

## 2013-10-23 DIAGNOSIS — R0781 Pleurodynia: Secondary | ICD-10-CM | POA: Diagnosis present

## 2013-10-23 DIAGNOSIS — R079 Chest pain, unspecified: Secondary | ICD-10-CM

## 2013-10-23 DIAGNOSIS — F1410 Cocaine abuse, uncomplicated: Secondary | ICD-10-CM | POA: Diagnosis present

## 2013-10-23 DIAGNOSIS — I5023 Acute on chronic systolic (congestive) heart failure: Secondary | ICD-10-CM

## 2013-10-23 LAB — D-DIMER, QUANTITATIVE (NOT AT ARMC): D DIMER QUANT: 0.81 ug{FEU}/mL — AB (ref 0.00–0.48)

## 2013-10-23 LAB — PRO B NATRIURETIC PEPTIDE: Pro B Natriuretic peptide (BNP): 2904 pg/mL — ABNORMAL HIGH (ref 0–125)

## 2013-10-23 LAB — CBC
HEMATOCRIT: 27.2 % — AB (ref 39.0–52.0)
HEMOGLOBIN: 8 g/dL — AB (ref 13.0–17.0)
MCH: 26.1 pg (ref 26.0–34.0)
MCHC: 29.4 g/dL — ABNORMAL LOW (ref 30.0–36.0)
MCV: 88.6 fL (ref 78.0–100.0)
Platelets: 317 10*3/uL (ref 150–400)
RBC: 3.07 MIL/uL — ABNORMAL LOW (ref 4.22–5.81)
RDW: 16 % — ABNORMAL HIGH (ref 11.5–15.5)
WBC: 4.6 10*3/uL (ref 4.0–10.5)

## 2013-10-23 LAB — I-STAT TROPONIN, ED
Troponin i, poc: 0.06 ng/mL (ref 0.00–0.08)
Troponin i, poc: 0.06 ng/mL (ref 0.00–0.08)

## 2013-10-23 LAB — RAPID URINE DRUG SCREEN, HOSP PERFORMED
Amphetamines: NOT DETECTED
BARBITURATES: NOT DETECTED
Benzodiazepines: NOT DETECTED
Cocaine: POSITIVE — AB
Opiates: POSITIVE — AB
TETRAHYDROCANNABINOL: NOT DETECTED

## 2013-10-23 LAB — BASIC METABOLIC PANEL
ANION GAP: 12 (ref 5–15)
BUN: 21 mg/dL (ref 6–23)
CO2: 22 mEq/L (ref 19–32)
CREATININE: 1.15 mg/dL (ref 0.50–1.35)
Calcium: 8.5 mg/dL (ref 8.4–10.5)
Chloride: 109 mEq/L (ref 96–112)
GFR, EST AFRICAN AMERICAN: 73 mL/min — AB (ref >90)
GFR, EST NON AFRICAN AMERICAN: 63 mL/min — AB (ref >90)
Glucose, Bld: 94 mg/dL (ref 70–99)
POTASSIUM: 4.4 meq/L (ref 3.7–5.3)
SODIUM: 143 meq/L (ref 137–147)

## 2013-10-23 LAB — POC OCCULT BLOOD, ED: FECAL OCCULT BLD: POSITIVE — AB

## 2013-10-23 MED ORDER — IOHEXOL 350 MG/ML SOLN
100.0000 mL | Freq: Once | INTRAVENOUS | Status: AC | PRN
  Administered 2013-10-23: 100 mL via INTRAVENOUS

## 2013-10-23 MED ORDER — NITROGLYCERIN 0.4 MG SL SUBL
0.4000 mg | SUBLINGUAL_TABLET | SUBLINGUAL | Status: DC | PRN
  Administered 2013-10-23: 0.4 mg via SUBLINGUAL

## 2013-10-23 MED ORDER — MORPHINE SULFATE 4 MG/ML IJ SOLN
4.0000 mg | Freq: Once | INTRAMUSCULAR | Status: AC
  Administered 2013-10-23: 4 mg via INTRAVENOUS
  Filled 2013-10-23: qty 1

## 2013-10-23 MED ORDER — ONDANSETRON HCL 4 MG/2ML IJ SOLN
4.0000 mg | Freq: Once | INTRAMUSCULAR | Status: AC
  Administered 2013-10-23: 4 mg via INTRAVENOUS
  Filled 2013-10-23: qty 2

## 2013-10-23 MED ORDER — FUROSEMIDE 10 MG/ML IJ SOLN
40.0000 mg | Freq: Once | INTRAMUSCULAR | Status: AC
  Administered 2013-10-23: 40 mg via INTRAVENOUS
  Filled 2013-10-23: qty 4

## 2013-10-23 NOTE — ED Notes (Signed)
Per EMS- pt woke this morning with sharp stabbing pain in left chest and arm. 87% ra initially and wheezing on left side with EMS pt recieved 5mg  95% on 2L. 324mg  of asprin, 1 nitro, and 4mg  of zofran with EMS. Pt also reports nonproductive cough for several weeks.

## 2013-10-23 NOTE — H&P (Signed)
Patient was discharged from the ED without any notification to our team. We have seen the patient and wrote admission orders on him.     Date: 10/23/2013               Patient Name:  Charles [name] MRN: [id number]  DOB: 1945-07-22 Age / Sex: 68 y.o., male   PCP: [name]         Medical Service: Internal Medicine Teaching Service         Attending Physician: Dr. Carlyle Basques, MD    First Contact: Dr. Genene Churn Pager: [phone]  Second Contact: Dr. Hayes Ludwig Pager: [phone]       After Hours (After 5p/  First Contact Pager: [phone]  weekends / holidays): Second Contact Pager: [phone]   Chief Complaint: chest pain  History of Present Illness: 68 yo male with hx of CAD s/p cabg, diastolic CHF with recent admission for chest pain, and cocaine abuse. Patient was discharged over the weekend. He comes back today with CP and SOB. Cp is sharp/stabbing on the left chest.  Pain wasn't relieved by nitro and asa. Pain doesn't change with cough or position. Has some worsening of CP with breathing.  He was not able to take any of his medication last few days as his landlord kicked him out and locked his apartment.  He also has cough for several weeks. Denies fever/chills, diarrhea, dysuria, hemoptysis. Has his chronic back/flank pain that's unchanged. Denies any cocaine abuse in the last few days, but admits to it on last Monday.   Meds: Current Facility-Administered Medications  Medication Dose Route Frequency Provider Last Rate Last Dose  . nitroGLYCERIN (NITROSTAT) SL tablet 0.4 mg  0.4 mg Sublingual Q5 min PRN Deno Etienne, MD       Current Outpatient Prescriptions  Medication Sig Dispense Refill  . amLODipine (NORVASC) 10 MG tablet Take 1 tablet (10 mg total) by mouth daily.  30 tablet  0  . aspirin EC 81 MG tablet Take 1 tablet (81 mg total) by mouth daily.      . chlorpheniramine (CHLOR-TRIMETON) 4 MG tablet Take 1 tablet (4 mg total) by mouth 2 (two) times daily.  14 tablet  0  .  citalopram (CELEXA) 20 MG tablet Take 20 mg by mouth daily.      . cloNIDine (CATAPRES) 0.3 MG tablet Take 1 tablet (0.3 mg total) by mouth 2 (two) times daily.  60 tablet  0  . docusate sodium 100 MG CAPS Take 100 mg by mouth 2 (two) times daily.  60 capsule  0  . ferrous sulfate 325 (65 FE) MG tablet Take 1 tablet (325 mg total) by mouth 3 (three) times daily with meals.  90 tablet  3  . furosemide (LASIX) 80 MG tablet Take 1 tablet (80 mg total) by mouth daily.  30 tablet  6  . gabapentin (NEURONTIN) 100 MG capsule Take 1 capsule (100 mg total) by mouth 2 (two) times daily.  60 capsule  2  . pantoprazole (PROTONIX) 40 MG tablet Take 1 tablet (40 mg total) by mouth 2 (two) times daily.  60 tablet  0  . phenytoin (DILANTIN) 100 MG ER capsule Take 2-3 capsules (200-300 mg total) by mouth 2 (two) times daily. Take 2 capsules by mouth in the morning and 3 capsules by mouth at bedtime.  90 capsule  2  . simvastatin (ZOCOR) 40 MG tablet Take 1 tablet (40 mg total) by mouth every evening.  30 tablet  1    Allergies: Allergies as of 10/23/2013 - Review Complete 10/23/2013  Allergen Reaction Noted  . Motrin [ibuprofen] Other (See Comments) 05/16/2010  . Tylenol [acetaminophen] Other (See Comments) 05/16/2010   Past Medical History  Diagnosis Date  . Iron deficiency anemia   . Hypertension   . Chronic diastolic heart failure     a. 2D ECHO 2015 50-55% and G2DD  . CAD (coronary artery disease)     a. s/p 3-vessel CABG (12/2007) // 100% RCA stenosis with collaterals from left system. Severe bifurcation lesions of proxima CXA and OM. Moderate LAD diseaseFollowed by Dr[facility] W[facility]  . Hyperlipidemia LDL goal <70   . Obstructive sleep apnea     a. not on home CPAP  . Ischemic cardiomyopathy     a. EF now normalized (2012 EF 35-40%)  . Chronic kidney disease (CKD), stage III (moderate)     BL SCr 1.5-1.6  . Homelessness   . Moderate to severe pulmonary hypertension 03/2010    PA peak  pressure of 76 mmHg (per 2D Echo 03/2010)  . Polysubstance abuse     a. cocaine, THC  . AV malformation of gastrointestinal tract   . Family history of early CAD   . DDD (degenerative disc disease), lumbar   . Renal artery stenosis   . Peripheral vascular disease   . Seizure disorder   . Diabetes   . Seizures    Past Surgical History  Procedure Laterality Date  . Coronary artery bypass graft  12/2007  . Apc  03/2010    To treat small bowel AVMs  . Esophagogastroduodenoscopy N/A 08/14/2012    Procedure: ESOPHAGOGASTRODUODENOSCOPY (EGD);  Surgeon: [name], MD;  Location: [facility] ENDOSCOPY;  Service: Endoscopy;  Laterality: N/A;  . Hot hemostasis N/A 08/14/2012    Procedure: HOT HEMOSTASIS (ARGON PLASMA COAGULATION/BICAP);  Surgeon: [name], MD;  Location: [facility] ENDOSCOPY;  Service: Endoscopy;  Laterality: N/A;  . Esophagogastroduodenoscopy (egd) with propofol N/A 08/04/2013    Procedure: ESOPHAGOGASTRODUODENOSCOPY (EGD) WITH PROPOFOL;  Surgeon: [name], MD;  Location: [facility] ENDOSCOPY;  Service: Endoscopy;  Laterality: N/A;   Family History  Problem Relation Age of Onset  . Heart disease Mother     unknown type  . Heart disease Father 76    died of MI at 11yo  . Heart disease Paternal Grandfather 82    died of MI  . Heart disease    . Heart disease Brother 30   History   Social History  . Marital Status: Single    Spouse Name: N/A    Number of Children: 2  . Years of Education: N/A   Occupational History  . Unemployed     previously worked in [employer]  .     Social History Main Topics  . Smoking status: Current Some Day Smoker -- 0.20 packs/day for 13 years    Types: Cigarettes    Last Attempt to Quit: 03/20/1997  . Smokeless tobacco: Never Used  . Alcohol Use: Yes     Comment: occasionally drinks, a few times a month  . Drug Use: 1.00 per week    Special: Cocaine, Marijuana  . Sexual Activity: No   Other Topics Concern  . Not on file   Social History  Narrative   Lives in [place]. Originally from [place], has been in the [place] since [place]  Review of Systems: Review of Systems  Constitutional: Negative for fever, chills and diaphoresis.  HENT: Positive for sore throat.   Eyes: Negative.   Respiratory: Positive for cough. Negative for hemoptysis.   Cardiovascular: Positive for chest pain and leg swelling. Negative for palpitations and orthopnea.  Gastrointestinal: Negative.   Genitourinary: Negative.   Musculoskeletal: Negative.   Neurological: Negative for dizziness, tingling, seizures, loss of consciousness, weakness and headaches.  Endo/Heme/Allergies: Negative.   Psychiatric/Behavioral: Negative.      Physical Exam: Blood pressure 182/107, pulse 93, temperature 98.1 F (36.7 C), temperature source Oral, resp. rate 27, weight 78.019 kg (172 lb), SpO2 96.00%. Physical Exam  Constitutional: He is oriented to person, place, and time. He appears well-developed and well-nourished. No distress.  HENT:  Head: Normocephalic and atraumatic.  Right Ear: External ear normal.  Left Ear: External ear normal.  Eyes: Conjunctivae and EOM are normal. Pupils are equal, round, and reactive to light. Right eye exhibits no discharge. Left eye exhibits no discharge. No scleral icterus.  Neck: Normal range of motion. Neck supple. JVD present.  Cardiovascular: Regular rhythm and normal heart sounds.  Tachycardia present.  Exam reveals no gallop.   No murmur heard. Respiratory: Effort normal and breath sounds normal. No respiratory distress. He has no rales. He exhibits no tenderness.  GI: Soft. Normal appearance. There is no tenderness.  Musculoskeletal: Normal range of motion. He exhibits edema. He exhibits no tenderness.  1+ edema BLE's.  Neurological: He is alert and oriented to person, place, and time. No cranial nerve deficit or sensory deficit. He exhibits normal muscle tone.  Skin: Skin is warm and dry.  He is not diaphoretic.  Psychiatric: He has a normal mood and affect. His speech is normal.     Lab results: Basic Metabolic Panel:  Recent Labs  10/23/13 1057  NA 143  K 4.4  CL 109  CO2 22  GLUCOSE 94  BUN 21  CREATININE 1.15  CALCIUM 8.5   Liver Function Tests: No results found for this basename: AST, ALT, ALKPHOS, BILITOT, PROT, ALBUMIN,  in the last 72 hours No results found for this basename: LIPASE, AMYLASE,  in the last 72 hours No results found for this basename: AMMONIA,  in the last 72 hours CBC:  Recent Labs  10/23/13 1057  WBC 4.6  HGB 8.0*  HCT 27.2*  MCV 88.6  PLT 317   Cardiac Enzymes: No results found for this basename: CKTOTAL, CKMB, CKMBINDEX, TROPONINI,  in the last 72 hours BNP:  Recent Labs  10/23/13 1057  PROBNP 2904.0*   D-Dimer:  Recent Labs  10/23/13 1057  DDIMER 0.81*  Urine Drug Screen: Drugs of Abuse     Component Value Date/Time   LABOPIA POSITIVE* 10/23/2013 1251   COCAINSCRNUR POSITIVE* 10/23/2013 1251   LABBENZ NONE DETECTED 10/23/2013 1251   AMPHETMU NONE DETECTED 10/23/2013 1251   THCU NONE DETECTED 10/23/2013 1251   LABBARB NONE DETECTED 10/23/2013 1251   Imaging results:  Dg Chest 2 View  10/23/2013   CLINICAL DATA:  Chest pain and shortness of breath.  EXAM: CHEST  2 VIEW  COMPARISON:  10/15/2013  FINDINGS: Interstitial edema suggested on the prior study has improved. There is central vascular congestion but no overt pulmonary edema. No lung consolidation is seen to suggest pneumonia. No pleural effusion or pneumothorax.  There are changes from CABG surgery, stable. There is stable mild enlargement of the cardiopericardial silhouette. No mediastinal or hilar masses.  Bony thorax is demineralized  but grossly intact.  IMPRESSION: 1. No acute cardiopulmonary disease. 2. Interstitial edema suggested on the prior study has improved. There is now stable mild cardiomegaly and central vascular congestion without evidence of interstitial  or airspace edema.   Electronically Signed   By: Lajean Manes M.D.   On: 10/23/2013 10:07   Ct Angio Chest Pe W/cm &/or Wo Cm  10/23/2013   CLINICAL DATA:  Chest pain and cough.  EXAM: CT ANGIOGRAPHY CHEST WITH CONTRAST  TECHNIQUE: Multidetector CT imaging of the chest was performed using the standard protocol during bolus administration of intravenous contrast. Multiplanar CT image reconstructions and MIPs were obtained to evaluate the vascular anatomy.  CONTRAST:  131mL OMNIPAQUE IOHEXOL 350 MG/ML SOLN  COMPARISON:  Current chest radiograph  FINDINGS: No evidence of a pulmonary embolus.  Heart is moderately enlarged. There are dense coronary artery calcifications and changes from previous CABG surgery. Both pulmonary arteries are mildly dilated measuring 3.1 cm. Main pulmonary artery measures 4.3 cm in diameter.  There is mild mediastinal adenopathy. Two reference measurements were made. There is a prevascular node measuring 11 mm and a right precarinal node measuring 16 mm in short axis. No mediastinal or hilar masses.  There are minimal bilateral pleural effusions. Mild interstitial thickening is noted in the lower lungs and there is mild hazy dependent opacity mostly in the lower lobes. There are more heterogeneous areas of reticular opacity seen peripherally mostly in the upper lobes and at the apices consistent with scarring.  Limited evaluation of the upper abdomen shows no acute findings.  Thoracic spine degenerative changes are noted. There are no osteoblastic or osteolytic lesions.  Review of the MIP images confirms the above findings.  IMPRESSION: 1. No evidence of a pulmonary embolus. 2. Moderate cardiomegaly, minimal pleural effusions and mild interstitial thickening at the lung bases, along with some hazy, dependent, lower lung opacity suggests mild congestive heart failure. No convincing pneumonia.   Electronically Signed   By: Lajean Manes M.D.   On: 10/23/2013 12:56    Other results: EKG:  sinus tachy, rate 103.  Assessment & Plan by Problem: Active Problems:   Chronic systolic heart failure   CAD (coronary artery disease)   Ischemic cardiomyopathy   Chest pain   Cocaine abuse   Chronic diastolic heart failure   Pleuritic chest pain   H/O medication noncompliance   Chest pain - hx of CAD s/p CABG 2009,  -EKG shows no ischemia and has hx of cocaine abuse. CT angio neg for PE. trops 0.6 negative. - cards consulted. Appreciate recs: cycle trops, likely 2/2 to cocaine abuse. - Utox positive for cocaine and opiates. - repeat EKG tomorrow.  - f/up with Dr. Haroldine Laws outpatient when cleared  Acute worsening of Chronic Diastolic HF - EF 26-37% C5YI ECHO 07/16/2013 - CXR shows stable cardiomegaly. appears to be stable, CT chest shows mild edema. - BNP elevated to 2904 from 1973 - perhaps b/c of not taking meds.  - 1x 80mg  IV lasix per card to help with overload.   HTN - elevated here 182/107 - home dose is amlodopine 10mg  QD, clonidine 0.3mg  TID, Lasix 80mg  daily,  - avoid BB b/c of cocaine abuse.  CKD stage 3  - Crt better than baseline 1.15 (baseline ~1.40).  - should hydrate and check BMP tomorrow after getting IV contrast.   Anemia - iron deficiency anemia - H/H 8.0 stable around baseline. Normocytic. - contineu ferrous sulfate 325mg  TID with colace.  HLD - cont  simvastatin.   Substance abuse - Utox positive for cocaine and opiates. - consulted on cocaine abstinence.  OSA - cont home CPAP  Back pain - likely MSK - will give him flexiril - NO narcotics.    Seizure hx - stable currently. -cont phenytoin.   Cardiology was consulted and they recommended outpatient follow up.  Will speak to SW to help him with placement issues. He has been living in a hotel upto today because he was kicked out by his landlord. He has no access to his meds.   Dispo: Disposition is deferred at this time, awaiting improvement of current medical problems. Anticipated  discharge in approximately 2-3 day(s).   The patient does have a current PCP [name]) and does need an [facility] hospital follow-up appointment after discharge.  The patient does not know have transportation limitations that hinder transportation to clinic appointments.  Signed: Dellia Nims, MD 10/23/2013, 1:51 PM

## 2013-10-23 NOTE — ED Provider Notes (Signed)
I have personally seen and examined the patient.  I have discussed the plan of care with the resident.  I have reviewed the documentation on PMH/FH/Soc. History.  I have reviewed the documentation of the resident and agree.  Orlie Dakin, MD 10/23/13 1626

## 2013-10-23 NOTE — Discharge Instructions (Signed)
Acute Coronary Syndrome  Acute coronary syndrome (ACS) is an urgent problem in which the blood and oxygen supply to the heart is critically deficient. ACS requires hospitalization because one or more coronary arteries may be blocked.  ACS represents a range of conditions including:  · Previous angina that is now unstable, lasts longer, happens at rest, or is more intense.  · A heart attack, with heart muscle cell injury and death.  There are three vital coronary arteries that supply the heart muscle with blood and oxygen so that it can pump blood effectively. If blockages to these arteries develop, blood flow to the heart muscle is reduced. If the heart does not get enough blood, angina may occur as the first warning sign.  SYMPTOMS   · The most common signs of angina include:  ¨ Tightness or squeezing in the chest.  ¨ Feeling of heaviness on the chest.  ¨ Discomfort in the arms, neck, back, or jaw.  ¨ Shortness of breath and nausea.  ¨ Cold, wet skin.  · Angina is usually brought on by physical effort or excitement which increase the oxygen needs of the heart. These states increase the blood flow needs of the heart beyond what can be delivered.  · Other symptoms that are not as common include:  ¨ Fatigue  ¨ Unexplained feelings of nervousness or anxiety  ¨ Weakness  ¨ Diarrhea  · Sometimes, you may not have noticed any symptoms at all but still suffered a cardiac injury.  TREATMENT   · Medicines to help discomfort may include nitroglycerin (nitro) in the form of tablets or a spray for rapid relief, or longer-acting forms such as cream, patches, or capsules. (Be aware that there are many side effects and possible interactions with other drugs).  · Other medicines may be used to help the heart pump better.  · Procedures to open blocked arteries including angioplasty or stent placement to keep the arteries open.  · Open heart surgery may be needed when there are many blockages or they are in critical locations that  are best treated with surgery.  HOME CARE INSTRUCTIONS   · Do not use any tobacco products including cigarettes, chewing tobacco, or electronic cigarettes.  · Take one baby or adult aspirin daily, if your health care provider advises. This helps reduce the risk of a heart attack.  · It is very important that you follow the angina treatment prescribed by your health care provider. Make arrangements for proper follow-up care.  · Eat a heart healthy diet with salt and fat restrictions as advised.  · Regular exercise is good for you as long as it does not cause discomfort. Do not begin any new type of exercise until you check with your health care provider.  · If you are overweight, you should lose weight.  · Try to maintain normal blood lipid levels.  · Keep your blood pressure under control as recommended by your health care provider.  · You should tell your health care provider right away about any increase in the severity or frequency of your chest discomfort or angina attacks. When you have angina, you should stop what you are doing and sit down. This may bring relief in 3 to 5 minutes. If your health care provider has prescribed nitro, take it as directed.  · If your health care provider has given you a follow-up appointment, it is very important to keep that appointment. Not keeping the appointment could result in a chronic or   permanent injury, pain, and disability. If there is any problem keeping the appointment, you must call back to this facility for assistance.  SEEK IMMEDIATE MEDICAL CARE IF:   · You develop nausea, vomiting, or shortness of breath.  · You feel faint, lightheaded, or pass out.  · Your chest discomfort gets worse.  · You are sweating or experience sudden profound fatigue.  · You do not get relief of your chest pain after 3 doses of nitro.  · Your discomfort lasts longer than 15 minutes.  MAKE SURE YOU:   · Understand these instructions.  · Will watch your condition.  · Will get help right  away if you are not doing well or get worse.  · Take all medicines as directed by your health care provider.  Document Released: 03/06/2005 Document Revised: 03/11/2013 Document Reviewed: 07/08/2013  [employer]® Patient Information ©2015 [employer]. This information is not intended to replace advice given to you by your health care provider. Make sure you discuss any questions you have with your health care provider.

## 2013-10-23 NOTE — ED Notes (Signed)
MD resident at bedside

## 2013-10-23 NOTE — ED Notes (Signed)
Pt states that he does not wasn't to try any additional nitro. States "all i'm getting is a headache from that stuff"

## 2013-10-23 NOTE — ED Provider Notes (Signed)
CSN: [id number]     Arrival date & time 10/23/13  0911 History   First MD Initiated Contact with Patient 10/23/13 [phone]     Chief Complaint  Patient presents with  . Chest Pain  . Shortness of Breath     (Consider location/radiation/quality/duration/timing/severity/associated sxs/prior Treatment) Patient is a 68 y.o. male presenting with chest pain and shortness of breath. The history is provided by the patient.  Chest Pain Pain location:  L chest Pain quality: sharp and stabbing   Pain radiates to:  L shoulder and L arm Pain radiates to the back: no   Pain severity:  Severe Onset quality:  Sudden Duration:  3 hours Timing:  Constant Progression:  Unchanged Chronicity:  Recurrent Context: breathing and movement   Relieved by:  Nothing Worsened by:  Nothing tried Ineffective treatments:  None tried Associated symptoms: cough and shortness of breath   Associated symptoms: no abdominal pain, no fever, no headache, no palpitations and not vomiting   Risk factors: coronary artery disease, high cholesterol, hypertension and male sex   Risk factors: no prior DVT/PE   Shortness of Breath Associated symptoms: chest pain and cough   Associated symptoms: no abdominal pain, no fever, no headaches, no rash and no vomiting    68 yo M with a chief complaint of left-sided sharp chest pain. This pain radiates to his left shoulder left arm. Patient has some associated shortness of breath with this. Patient denies any diaphoresis and nausea. This pain feels similar to the pain he was recently admitted for about a week ago. Patient at that visit was deemed to be secondary to musculoskeletal pain. Patient was discharged home was supposed to follow up next week. Patient denies any illegal drug use. He endorses using his home medications. Patient denies any fevers or chills. Patient has had a cough off and on for the past 3 weeks or so. Patient with a history of COPD now smokes less than a pack a week.  Patient also states that he feels anemic. Patient states that he has been having some shortness in his stool for the past week or so.  Past Medical History  Diagnosis Date  . Iron deficiency anemia   . Hypertension   . Chronic diastolic heart failure     a. 2D ECHO 2015 50-55% and G2DD  . CAD (coronary artery disease)     a. s/p 3-vessel CABG (12/2007) // 100% RCA stenosis with collaterals from left system. Severe bifurcation lesions of proxima CXA and OM. Moderate LAD diseaseFollowed by Dr. Wyline Copas in [facility]  . Hyperlipidemia LDL goal <70   . Obstructive sleep apnea     a. not on home CPAP  . Ischemic cardiomyopathy     a. EF now normalized (2012 EF 35-40%)  . Chronic kidney disease (CKD), stage III (moderate)     BL SCr 1.5-1.6  . Homelessness   . 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 Polysubstance abuse     a. cocaine, THC  . AV malformation of gastrointestinal tract   . Family history of early CAD   . DDD (degenerative disc disease), lumbar   . Renal artery stenosis   . Peripheral vascular disease   . Seizure disorder   . Diabetes   . Seizures    Past Surgical History  Procedure Laterality Date  . Coronary artery bypass graft  12/2007  . Apc  03/2010  To treat small bowel AVMs  . Esophagogastroduodenoscopy N/A 08/14/2012    Procedure: ESOPHAGOGASTRODUODENOSCOPY (EGD);  Surgeon: [name], MD;  Location: [facility] ENDOSCOPY;  Service: Endoscopy;  Laterality: N/A;  . Hot hemostasis N/A 08/14/2012    Procedure: HOT HEMOSTASIS (ARGON PLASMA COAGULATION/BICAP);  Surgeon: [name], MD;  Location: [facility] ENDOSCOPY;  Service: Endoscopy;  Laterality: N/A;  . Esophagogastroduodenoscopy (egd) with propofol N/A 08/04/2013    Procedure: ESOPHAGOGASTRODUODENOSCOPY (EGD) WITH PROPOFOL;  Surgeon: [name], MD;  Location: [facility] ENDOSCOPY;  Service: Endoscopy;  Laterality: N/A;   Family History  Problem Relation Age of  Onset  . Heart disease Mother     unknown type  . Heart disease Father 70    died of MI at 89yo  . Heart disease Paternal Grandfather 16    died of MI  . Heart disease    . Heart disease Brother 30   History  Substance Use Topics  . Smoking status: Current Some Day Smoker -- 0.20 packs/day for 13 years    Types: Cigarettes    Last Attempt to Quit: 03/20/1997  . Smokeless tobacco: Never Used  . Alcohol Use: Yes     Comment: occasionally drinks, a few times a month    Review of Systems  Constitutional: Negative for fever and chills.  HENT: Negative for congestion and facial swelling.   Eyes: Negative for discharge and visual disturbance.  Respiratory: Positive for cough and shortness of breath.   Cardiovascular: Positive for chest pain. Negative for palpitations.  Gastrointestinal: Negative for vomiting, abdominal pain and diarrhea.  Musculoskeletal: Negative for arthralgias and myalgias.  Skin: Negative for color change and rash.  Neurological: Negative for tremors, syncope and headaches.  Psychiatric/Behavioral: Negative for confusion and dysphoric mood.      Allergies  Motrin and Tylenol  Home Medications   Prior to Admission medications   Medication Sig Start Date End Date Taking? Authorizing Provider  amLODipine (NORVASC) 10 MG tablet Take 1 tablet (10 mg total) by mouth daily. 10/18/13  Yes Blain Pais, MD  aspirin EC 81 MG tablet Take 1 tablet (81 mg total) by mouth daily. 08/18/13  Yes [name], MD  chlorpheniramine (CHLOR-TRIMETON) 4 MG tablet Take 1 tablet (4 mg total) by mouth 2 (two) times daily. 10/18/13  Yes Blain Pais, MD  citalopram (CELEXA) 20 MG tablet Take 20 mg by mouth daily.   Yes Historical Provider, MD  cloNIDine (CATAPRES) 0.3 MG tablet Take 1 tablet (0.3 mg total) by mouth 2 (two) times daily. 10/18/13  Yes Blain Pais, MD  docusate sodium 100 MG CAPS Take 100 mg by mouth 2 (two) times daily. 10/18/13  Yes Blain Pais, MD  ferrous sulfate 325 (65 FE) MG tablet Take 1 tablet (325 mg total) by mouth 3 (three) times daily with meals. 10/18/13  Yes Blain Pais, MD  furosemide (LASIX) 80 MG tablet Take 1 tablet (80 mg total) by mouth daily. 10/18/13  Yes Blain Pais, MD  gabapentin (NEURONTIN) 100 MG capsule Take 1 capsule (100 mg total) by mouth 2 (two) times daily. 07/21/13  Yes Charlynne Cousins, MD  pantoprazole (PROTONIX) 40 MG tablet Take 1 tablet (40 mg total) by mouth 2 (two) times daily. 08/04/13  Yes [name], MD  phenytoin (DILANTIN) 100 MG ER capsule Take 2-3 capsules (200-300 mg total) by mouth 2 (two) times daily. Take 2 capsules by mouth in the morning and 3 capsules by mouth at bedtime.  08/04/13  Yes [name], MD  simvastatin (ZOCOR) 40 MG tablet Take 1 tablet (40 mg total) by mouth every evening. 03/13/13  Yes [name], MD   BP 186/109  Pulse 94  Temp(Src) 98.1 F (36.7 C) (Oral)  Resp 26  Wt 172 lb (78.019 kg)  SpO2 93% Physical Exam  Constitutional: He is oriented to person, place, and time. He appears well-developed and well-nourished.  HENT:  Head: Normocephalic and atraumatic.  JVD to the angle of the jaw  Eyes: EOM are normal. Pupils are equal, round, and reactive to light.  Neck: Normal range of motion. Neck supple. No JVD present.  Cardiovascular: Normal rate and regular rhythm.  Exam reveals no gallop and no friction rub.   No murmur heard. Pulmonary/Chest: No respiratory distress. He has no wheezes. He has rales (in bilateral bases).  Abdominal: He exhibits no distension. There is no rebound and no guarding.  Musculoskeletal: Normal range of motion. He exhibits edema (2+).  Neurological: He is alert and oriented to person, place, and time.  Skin: No rash noted. No pallor.  Psychiatric: He has a normal mood and affect. His behavior is normal.    ED Course  Procedures (including critical care time) Labs Review Labs Reviewed  CBC -  Abnormal; Notable for the following:    RBC 3.07 (*)    Hemoglobin 8.0 (*)    HCT 27.2 (*)    MCHC 29.4 (*)    RDW 16.0 (*)    All other components within normal limits  BASIC METABOLIC PANEL - Abnormal; Notable for the following:    GFR calc non Af Amer 63 (*)    GFR calc Af Amer 73 (*)    All other components within normal limits  PRO B NATRIURETIC PEPTIDE - Abnormal; Notable for the following:    Pro B Natriuretic peptide (BNP) 2904.0 (*)    All other components within normal limits  URINE RAPID DRUG SCREEN (HOSP PERFORMED) - Abnormal; Notable for the following:    Opiates POSITIVE (*)    Cocaine POSITIVE (*)    All other components within normal limits  D-DIMER, QUANTITATIVE - Abnormal; Notable for the following:    D-Dimer, Quant 0.81 (*)    All other components within normal limits  POC OCCULT BLOOD, ED - Abnormal; Notable for the following:    Fecal Occult Bld POSITIVE (*)    All other components within normal limits  I-STAT TROPOININ, ED  I-STAT TROPOININ, ED    Imaging Review Dg Chest 2 View  10/23/2013   CLINICAL DATA:  Chest pain and shortness of breath.  EXAM: CHEST  2 VIEW  COMPARISON:  10/15/2013  FINDINGS: Interstitial edema suggested on the prior study has improved. There is central vascular congestion but no overt pulmonary edema. No lung consolidation is seen to suggest pneumonia. No pleural effusion or pneumothorax.  There are changes from CABG surgery, stable. There is stable mild enlargement of the cardiopericardial silhouette. No mediastinal or hilar masses.  Bony thorax is demineralized but grossly intact.  IMPRESSION: 1. No acute cardiopulmonary disease. 2. Interstitial edema suggested on the prior study has improved. There is now stable mild cardiomegaly and central vascular congestion without evidence of interstitial or airspace edema.   Electronically Signed   By: Lajean Manes M.D.   On: 10/23/2013 10:07   Ct Angio Chest Pe W/cm &/or Wo Cm  10/23/2013    CLINICAL DATA:  Chest pain and cough.  EXAM: CT ANGIOGRAPHY  CHEST WITH CONTRAST  TECHNIQUE: Multidetector CT imaging of the chest was performed using the standard protocol during bolus administration of intravenous contrast. Multiplanar CT image reconstructions and MIPs were obtained to evaluate the vascular anatomy.  CONTRAST:  110mL OMNIPAQUE IOHEXOL 350 MG/ML SOLN  COMPARISON:  Current chest radiograph  FINDINGS: No evidence of a pulmonary embolus.  Heart is moderately enlarged. There are dense coronary artery calcifications and changes from previous CABG surgery. Both pulmonary arteries are mildly dilated measuring 3.1 cm. Main pulmonary artery measures 4.3 cm in diameter.  There is mild mediastinal adenopathy. Two reference measurements were made. There is a prevascular node measuring 11 mm and a right precarinal node measuring 16 mm in short axis. No mediastinal or hilar masses.  There are minimal bilateral pleural effusions. Mild interstitial thickening is noted in the lower lungs and there is mild hazy dependent opacity mostly in the lower lobes. There are more heterogeneous areas of reticular opacity seen peripherally mostly in the upper lobes and at the apices consistent with scarring.  Limited evaluation of the upper abdomen shows no acute findings.  Thoracic spine degenerative changes are noted. There are no osteoblastic or osteolytic lesions.  Review of the MIP images confirms the above findings.  IMPRESSION: 1. No evidence of a pulmonary embolus. 2. Moderate cardiomegaly, minimal pleural effusions and mild interstitial thickening at the lung bases, along with some hazy, dependent, lower lung opacity suggests mild congestive heart failure. No convincing pneumonia.   Electronically Signed   By: Lajean Manes M.D.   On: 10/23/2013 12:56     EKG Interpretation   Date/Time:  Thursday October 23 2013 09:20:42 EDT Ventricular Rate:  103 PR Interval:  160 QRS Duration: 117 QT Interval:  380 QTC  Calculation: 497 R Axis:   68 Text Interpretation:  Sinus tachycardia Incomplete right bundle branch  block Inferior infarct, age indeterminate Lateral leads are also involved  SINCE LAST TRACING HEART RATE HAS INCREASED Confirmed by Winfred Leeds  MD,  SAM [phone]) on 10/23/2013 9:33:02 AM      MDM   Final diagnoses:  Chest pain, unspecified chest pain type    68 yo M with left-sided sharp chest pain. Associated with shortness of breath. Patient denies pleuritic pain. History of cocaine use in the past. Patient with rales in the bases JVD trace edema in the legs. Likely CHF exacerbation. Will obtain chest x-ray EKG troponin BNP, BMP.  Hemoccult-positive stool without melena.  + Ddimer, will CT agio PE study.   PE study negative, IM teaching service consulted.  Cards evaluated patient, feel he is safe for discharge if second trop negative.    Second trop negative. Patient states that he does have access to his medicine now and will continue taking them.   4:19 PM:  I have discussed the diagnosis/risks/treatment options with the patient and believe the pt to be eligible for discharge home to follow-up with PCP. We also discussed returning to the ED immediately if new or worsening sx occur. We discussed the sx which are most concerning (e.g., worsening cp, shortness of breath. ) that necessitate immediate return. Medications administered to the patient during their visit and any new prescriptions provided to the patient are listed below.  Medications given during this visit Medications  nitroGLYCERIN (NITROSTAT) SL tablet 0.4 mg (0.4 mg Sublingual Given 10/23/13 1459)  morphine 4 MG/ML injection 4 mg (4 mg Intravenous Given 10/23/13 1142)  ondansetron (ZOFRAN) injection 4 mg (4 mg Intravenous Given 10/23/13 1142)  furosemide (  LASIX) injection 40 mg (40 mg Intravenous Given 10/23/13 1143)  iohexol (OMNIPAQUE) 350 MG/ML injection 100 mL (100 mLs Intravenous Contrast Given 10/23/13 1218)    New  Prescriptions   No medications on file     Deno Etienne, MD 10/23/13 [phone]

## 2013-10-23 NOTE — Consult Note (Signed)
CONSULTATION NOTE  Reason for Consult: Chest pain  Requesting Physician: Dr. Winfred Leeds  Cardiologist: Dr. Haroldine Laws  HPI: This is a 68 y.o. male with a past medical history significant for 68 yo with history of CAD s/p CABG (negative nuclear stress test in 12/14, which demonstrated a scar), history of diastolic CHF and recent admission with chest pain, acute/chronic diastolic CHF, and recurrent ongoing cocaine abuse. He last used cocaine 3 days ago. He apparently was "locked" out of his apartment and could not get his medications, which he has not taken in days. Then he woke up today with left flank pain, soreness with deep breathing, and chest discomfort. He also reports cough which has been present for "over a year".   PMHx:  Past Medical History  Diagnosis Date  . Iron deficiency anemia   . Hypertension   . Chronic diastolic heart failure     a. 2D ECHO 2015 50-55% and G2DD  . CAD (coronary artery disease)     a. s/p 3-vessel CABG (12/2007) // 100% RCA stenosis with collaterals from left system. Severe bifurcation lesions of proxima CXA and OM. Moderate LAD diseaseFollowed by Dr. [facility]  . Hyperlipidemia LDL goal <70   . Obstructive sleep apnea     a. not on home CPAP  . Ischemic cardiomyopathy     a. EF now normalized (2012 EF 35-40%)  . Chronic kidney disease (CKD), stage III (moderate)     BL SCr 1.5-1.6  . Homelessness   . 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 Polysubstance abuse     a. cocaine, THC  . AV malformation of gastrointestinal tract   . Family history of early CAD   . DDD (degenerative disc disease), lumbar   . Renal artery stenosis   . Peripheral vascular disease   . Seizure disorder   . Diabetes   . Seizures    Past Surgical History  Procedure Laterality Date  . Coronary artery bypass graft  12/2007  . Apc  03/2010    To treat small bowel AVMs  .  Esophagogastroduodenoscopy N/A 08/14/2012    Procedure: ESOPHAGOGASTRODUODENOSCOPY (EGD);  Surgeon: [name], MD;  Location: [facility] ENDOSCOPY;  Service: Endoscopy;  Laterality: N/A;  . Hot hemostasis N/A 08/14/2012    Procedure: HOT HEMOSTASIS (ARGON PLASMA COAGULATION/BICAP);  Surgeon: [name], MD;  Location: [facility] ENDOSCOPY;  Service: Endoscopy;  Laterality: N/A;  . Esophagogastroduodenoscopy (egd) with propofol N/A 08/04/2013    Procedure: ESOPHAGOGASTRODUODENOSCOPY (EGD) WITH PROPOFOL;  Surgeon: [name], MD;  Location: [facility] ENDOSCOPY;  Service: Endoscopy;  Laterality: N/A;    FAMHx: Family History  Problem Relation Age of Onset  . Heart disease Mother     unknown type  . Heart disease Father 33    died of MI at 24yo  . Heart disease Paternal Grandfather 74    died of MI  . Heart disease    . Heart disease Brother 67    SOCHx:  reports that he has been smoking Cigarettes.  He has a 2.6 pack-year smoking history. He has never used smokeless tobacco. He reports that he drinks alcohol. He reports that he uses illicit drugs (Cocaine and Marijuana) about once per week.  ALLERGIES: Allergies  Allergen Reactions  . Motrin [Ibuprofen] Other (See Comments)    Affects kidneys  . Tylenol [Acetaminophen] Other (See Comments)    Affects kidneys  ROS: A comprehensive review of systems was negative except for: Respiratory: positive for pleurisy/chest pain Cardiovascular: positive for chest pressure/discomfort and dyspnea  HOME MEDICATIONS:   Medication List    ASK your doctor about these medications       amLODipine 10 MG tablet  Commonly known as:  NORVASC  Take 1 tablet (10 mg total) by mouth daily.     aspirin EC 81 MG tablet  Take 1 tablet (81 mg total) by mouth daily.     chlorpheniramine 4 MG tablet  Commonly known as:  CHLOR-TRIMETON  Take 1 tablet (4 mg total) by mouth 2 (two) times daily.     citalopram 20 MG tablet  Commonly known as:  CELEXA  Take 20 mg by  mouth daily.     cloNIDine 0.3 MG tablet  Commonly known as:  CATAPRES  Take 1 tablet (0.3 mg total) by mouth 2 (two) times daily.     DSS 100 MG Caps  Take 100 mg by mouth 2 (two) times daily.     ferrous sulfate 325 (65 FE) MG tablet  Take 1 tablet (325 mg total) by mouth 3 (three) times daily with meals.     furosemide 80 MG tablet  Commonly known as:  LASIX  Take 1 tablet (80 mg total) by mouth daily.     gabapentin 100 MG capsule  Commonly known as:  NEURONTIN  Take 1 capsule (100 mg total) by mouth 2 (two) times daily.     pantoprazole 40 MG tablet  Commonly known as:  PROTONIX  Take 1 tablet (40 mg total) by mouth 2 (two) times daily.     phenytoin 100 MG ER capsule  Commonly known as:  DILANTIN  Take 2-3 capsules (200-300 mg total) by mouth 2 (two) times daily. Take 2 capsules by mouth in the morning and 3 capsules by mouth at bedtime.     simvastatin 40 MG tablet  Commonly known as:  ZOCOR  Take 1 tablet (40 mg total) by mouth every evening.        HOSPITAL MEDICATIONS: I have reviewed the patient's current medications.  VITALS: Blood pressure 182/107, pulse 93, temperature 98.1 F (36.7 C), temperature source Oral, resp. rate 27, weight 172 lb (78.019 kg), SpO2 96.00%.  PHYSICAL EXAM: General appearance: alert, no distress and lying flat Neck: no carotid bruit and no JVD Lungs: clear to auscultation bilaterally Heart: regular rate and rhythm, S1, S2 normal, no murmur, click, rub or gallop Abdomen: soft, non-tender; bowel sounds normal; no masses,  no organomegaly Extremities: extremities normal, atraumatic, no cyanosis or edema Pulses: 2+ and symmetric Skin: Skin color, texture, turgor normal. No rashes or lesions Neurologic: Grossly normal Psych: Agitated  LABS: Results for orders placed during the hospital encounter of 10/23/13 (from the past 48 hour(s))  POC OCCULT BLOOD, ED     Status: Abnormal   Collection Time    10/23/13  9:37 AM      Result  Value Ref Range   Fecal Occult Bld POSITIVE (*) NEGATIVE  CBC     Status: Abnormal   Collection Time    10/23/13 10:57 AM      Result Value Ref Range   WBC 4.6  4.0 - 10.5 K/uL   RBC 3.07 (*) 4.22 - 5.81 MIL/uL   Hemoglobin 8.0 (*) 13.0 - 17.0 g/dL   HCT 27.2 (*) 39.0 - 52.0 %   MCV 88.6  78.0 - 100.0 fL   MCH 26.1  26.0 -  34.0 pg   MCHC 29.4 (*) 30.0 - 36.0 g/dL   RDW 16.0 (*) 11.5 - 15.5 %   Platelets 317  150 - 400 K/uL  BASIC METABOLIC PANEL     Status: Abnormal   Collection Time    10/23/13 10:57 AM      Result Value Ref Range   Sodium 143  137 - 147 mEq/L   Potassium 4.4  3.7 - 5.3 mEq/L   Chloride 109  96 - 112 mEq/L   CO2 22  19 - 32 mEq/L   Glucose, Bld 94  70 - 99 mg/dL   BUN 21  6 - 23 mg/dL   Creatinine, Ser 1.15  0.50 - 1.35 mg/dL   Calcium 8.5  8.4 - 10.5 mg/dL   GFR calc non Af Amer 63 (*) >90 mL/min   GFR calc Af Amer 73 (*) >90 mL/min   Comment: (NOTE)     The eGFR has been calculated using the CKD EPI equation.     This calculation has not been validated in all clinical situations.     eGFR's persistently <90 mL/min signify possible Chronic Kidney     Disease.   Anion gap 12  5 - 15  PRO B NATRIURETIC PEPTIDE     Status: Abnormal   Collection Time    10/23/13 10:57 AM      Result Value Ref Range   Pro B Natriuretic peptide (BNP) 2904.0 (*) 0 - 125 pg/mL  D-DIMER, QUANTITATIVE     Status: Abnormal   Collection Time    10/23/13 10:57 AM      Result Value Ref Range   D-Dimer, Quant 0.81 (*) 0.00 - 0.48 ug/mL-FEU   Comment:            AT THE INHOUSE ESTABLISHED CUTOFF     VALUE OF 0.48 ug/mL FEU,     THIS ASSAY HAS BEEN DOCUMENTED     IN THE LITERATURE TO HAVE     A SENSITIVITY AND NEGATIVE     PREDICTIVE VALUE OF AT LEAST     98 TO 99%.  THE TEST RESULT     SHOULD BE CORRELATED WITH     AN ASSESSMENT OF THE CLINICAL     PROBABILITY OF DVT / VTE.  Randolm Idol, ED     Status: None   Collection Time    10/23/13 11:10 AM      Result Value Ref  Range   Troponin i, poc 0.06  0.00 - 0.08 ng/mL   Comment 3            Comment: Due to the release kinetics of cTnI,     a negative result within the first hours     of the onset of symptoms does not rule out     myocardial infarction with certainty.     If myocardial infarction is still suspected,     repeat the test at appropriate intervals.    IMAGING: Dg Chest 2 View  10/23/2013   CLINICAL DATA:  Chest pain and shortness of breath.  EXAM: CHEST  2 VIEW  COMPARISON:  10/15/2013  FINDINGS: Interstitial edema suggested on the prior study has improved. There is central vascular congestion but no overt pulmonary edema. No lung consolidation is seen to suggest pneumonia. No pleural effusion or pneumothorax.  There are changes from CABG surgery, stable. There is stable mild enlargement of the cardiopericardial silhouette. No mediastinal or hilar masses.  Bony thorax is demineralized  but grossly intact.  IMPRESSION: 1. No acute cardiopulmonary disease. 2. Interstitial edema suggested on the prior study has improved. There is now stable mild cardiomegaly and central vascular congestion without evidence of interstitial or airspace edema.   Electronically Signed   By: Lajean Manes M.D.   On: 10/23/2013 10:07   Ct Angio Chest Pe W/cm &/or Wo Cm  10/23/2013   CLINICAL DATA:  Chest pain and cough.  EXAM: CT ANGIOGRAPHY CHEST WITH CONTRAST  TECHNIQUE: Multidetector CT imaging of the chest was performed using the standard protocol during bolus administration of intravenous contrast. Multiplanar CT image reconstructions and MIPs were obtained to evaluate the vascular anatomy.  CONTRAST:  124m OMNIPAQUE IOHEXOL 350 MG/ML SOLN  COMPARISON:  Current chest radiograph  FINDINGS: No evidence of a pulmonary embolus.  Heart is moderately enlarged. There are dense coronary artery calcifications and changes from previous CABG surgery. Both pulmonary arteries are mildly dilated measuring 3.1 cm. Main pulmonary artery  measures 4.3 cm in diameter.  There is mild mediastinal adenopathy. Two reference measurements were made. There is a prevascular node measuring 11 mm and a right precarinal node measuring 16 mm in short axis. No mediastinal or hilar masses.  There are minimal bilateral pleural effusions. Mild interstitial thickening is noted in the lower lungs and there is mild hazy dependent opacity mostly in the lower lobes. There are more heterogeneous areas of reticular opacity seen peripherally mostly in the upper lobes and at the apices consistent with scarring.  Limited evaluation of the upper abdomen shows no acute findings.  Thoracic spine degenerative changes are noted. There are no osteoblastic or osteolytic lesions.  Review of the MIP images confirms the above findings.  IMPRESSION: 1. No evidence of a pulmonary embolus. 2. Moderate cardiomegaly, minimal pleural effusions and mild interstitial thickening at the lung bases, along with some hazy, dependent, lower lung opacity suggests mild congestive heart failure. No convincing pneumonia.   Electronically Signed   By: DLajean ManesM.D.   On: 10/23/2013 12:56    HOSPITAL DIAGNOSES: Active Problems:   Chronic systolic heart failure   CAD (coronary artery disease)   Ischemic cardiomyopathy   Chest pain   Cocaine abuse   Chronic diastolic heart failure   Pleuritic chest pain   H/O medication noncompliance   IMPRESSION: 1.   Chest pain, possible due to cocaine use and medication non-compliance 2.   Extensive CAD history 3.   Ischemic cardiomyopathy, with possible mild CHF exacerbation due to medication non-compliance and cocaine abuse  RECOMMENDATION: 1. Mr. YYoungbloodis describing 8/10 chest pain at rest, however, his EKG is non-ischemic. He is lying flat and appears comfortable. He says his pain is worse with breathing and there is some left flank pain. He was just admitted and discharged last week and the week before that. BNP is elevated somewhat to  2904 from 1973, I suspect this is because he has not been taking his medications. POC troponin is 0.06, negative and consistent with CHF. CT angiogram was negative for PE.  I would obtain a second set of cardiac enzymes and if negative, no further cardiac work-up for ischemia would be necessary at this time. He is advised to discontinue cocaine use and comply with his heart failure and anti-anginal medications. May benefit from a 1x dose of IV lasix.  If he rules-out, he can follow-up with Dr. BHaroldine Laws  Time Spent Directly with Patient: 30 minutes  KPixie Casino MD, FLindsay Municipal HospitalAttending Cardiologist CRandleman  Tinley Rought C 10/23/2013, 1:20 PM

## 2013-10-23 NOTE — ED Notes (Signed)
Pt returned to 2L [place] for sats at 84%.

## 2013-10-23 NOTE — ED Notes (Signed)
MD at bedside. 

## 2013-10-23 NOTE — ED Provider Notes (Addendum)
And complains of anterior chest pain onset upon awakening this morning. Demonstrated patient with nitroglycerin aspirin and Zofran, without relief. He also admits to a cough, chronic. Pain is not worse with coughing. Pain feels like her pain is in the past. Patient admits to using cocaine last time 3 days ago. On exam patient is in no distress. He falls asleep as I talk to him. Lungs clear auscultation heart regular rate and rhythm abdomen nondistended nontender. Spoke with cardiologist Dr. Debara Pickett who evaluated patient and suggests that patient may be having coronary spasm secondary to cocaine abuse however serial troponins are negative patient is stable for discharge and can follow up with his primary care physician or cardiologist as outpatient  Orlie Dakin, MD 10/23/13 1531 I have personally seen and examined the patient.  I have discussed the plan of care with the resident.  I have reviewed the documentation on PMH/FH/Soc. History.  I have reviewed the documentation of the resident and agree.  Orlie Dakin, MD 10/23/13 [phone]

## 2013-10-24 NOTE — H&P (Signed)
As noted by Dr. Genene Churn. Initially planned to be admitted to the inpatient service but after being cleared by cardiology, he was discharged from the ED. No billing charge should be associated with this note

## 2013-11-26 ENCOUNTER — Inpatient Hospital Stay (HOSPITAL_COMMUNITY)
Admission: EM | Admit: 2013-11-26 | Discharge: 2013-12-01 | DRG: 292 | Disposition: A | Discharge status: Home / Self Care | Payer: Medicare Other | Attending: Internal Medicine | Admitting: Internal Medicine

## 2013-11-26 ENCOUNTER — Emergency Department (HOSPITAL_COMMUNITY): Payer: Medicare Other

## 2013-11-26 ENCOUNTER — Encounter (HOSPITAL_COMMUNITY): Payer: Self-pay | Admitting: Emergency Medicine

## 2013-11-26 DIAGNOSIS — G40909 Epilepsy, unspecified, not intractable, without status epilepticus: Secondary | ICD-10-CM | POA: Diagnosis present

## 2013-11-26 DIAGNOSIS — I255 Ischemic cardiomyopathy: Secondary | ICD-10-CM | POA: Diagnosis present

## 2013-11-26 DIAGNOSIS — Z8249 Family history of ischemic heart disease and other diseases of the circulatory system: Secondary | ICD-10-CM

## 2013-11-26 DIAGNOSIS — Z5900 Homelessness unspecified: Secondary | ICD-10-CM | POA: Diagnosis not present

## 2013-11-26 DIAGNOSIS — J449 Chronic obstructive pulmonary disease, unspecified: Secondary | ICD-10-CM | POA: Diagnosis present

## 2013-11-26 DIAGNOSIS — D509 Iron deficiency anemia, unspecified: Secondary | ICD-10-CM | POA: Diagnosis present

## 2013-11-26 DIAGNOSIS — Z7982 Long term (current) use of aspirin: Secondary | ICD-10-CM

## 2013-11-26 DIAGNOSIS — I5043 Acute on chronic combined systolic (congestive) and diastolic (congestive) heart failure: Secondary | ICD-10-CM | POA: Diagnosis present

## 2013-11-26 DIAGNOSIS — I129 Hypertensive chronic kidney disease with stage 1 through stage 4 chronic kidney disease, or unspecified chronic kidney disease: Secondary | ICD-10-CM | POA: Diagnosis present

## 2013-11-26 DIAGNOSIS — R569 Unspecified convulsions: Secondary | ICD-10-CM | POA: Diagnosis present

## 2013-11-26 DIAGNOSIS — Z9119 Patient's noncompliance with other medical treatment and regimen: Secondary | ICD-10-CM

## 2013-11-26 DIAGNOSIS — I200 Unstable angina: Secondary | ICD-10-CM | POA: Diagnosis present

## 2013-11-26 DIAGNOSIS — E119 Type 2 diabetes mellitus without complications: Secondary | ICD-10-CM | POA: Diagnosis present

## 2013-11-26 DIAGNOSIS — I2589 Other forms of chronic ischemic heart disease: Secondary | ICD-10-CM | POA: Diagnosis present

## 2013-11-26 DIAGNOSIS — Q2733 Arteriovenous malformation of digestive system vessel: Secondary | ICD-10-CM | POA: Diagnosis not present

## 2013-11-26 DIAGNOSIS — R079 Chest pain, unspecified: Secondary | ICD-10-CM

## 2013-11-26 DIAGNOSIS — I471 Supraventricular tachycardia: Secondary | ICD-10-CM | POA: Diagnosis not present

## 2013-11-26 DIAGNOSIS — D649 Anemia, unspecified: Secondary | ICD-10-CM

## 2013-11-26 DIAGNOSIS — K31819 Angiodysplasia of stomach and duodenum without bleeding: Secondary | ICD-10-CM

## 2013-11-26 DIAGNOSIS — E872 Acidosis, unspecified: Secondary | ICD-10-CM | POA: Diagnosis present

## 2013-11-26 DIAGNOSIS — I5023 Acute on chronic systolic (congestive) heart failure: Secondary | ICD-10-CM

## 2013-11-26 DIAGNOSIS — I252 Old myocardial infarction: Secondary | ICD-10-CM

## 2013-11-26 DIAGNOSIS — I10 Essential (primary) hypertension: Secondary | ICD-10-CM | POA: Diagnosis present

## 2013-11-26 DIAGNOSIS — Z951 Presence of aortocoronary bypass graft: Secondary | ICD-10-CM | POA: Diagnosis not present

## 2013-11-26 DIAGNOSIS — I5022 Chronic systolic (congestive) heart failure: Secondary | ICD-10-CM

## 2013-11-26 DIAGNOSIS — I2510 Atherosclerotic heart disease of native coronary artery without angina pectoris: Secondary | ICD-10-CM | POA: Diagnosis present

## 2013-11-26 DIAGNOSIS — F1410 Cocaine abuse, uncomplicated: Secondary | ICD-10-CM | POA: Diagnosis present

## 2013-11-26 DIAGNOSIS — Z91199 Patient's noncompliance with other medical treatment and regimen due to unspecified reason: Secondary | ICD-10-CM

## 2013-11-26 DIAGNOSIS — Z9114 Patient's other noncompliance with medication regimen: Secondary | ICD-10-CM

## 2013-11-26 DIAGNOSIS — E785 Hyperlipidemia, unspecified: Secondary | ICD-10-CM | POA: Diagnosis present

## 2013-11-26 DIAGNOSIS — F3289 Other specified depressive episodes: Secondary | ICD-10-CM | POA: Diagnosis present

## 2013-11-26 DIAGNOSIS — N183 Chronic kidney disease, stage 3 unspecified: Secondary | ICD-10-CM | POA: Diagnosis present

## 2013-11-26 DIAGNOSIS — I25810 Atherosclerosis of coronary artery bypass graft(s) without angina pectoris: Secondary | ICD-10-CM

## 2013-11-26 DIAGNOSIS — I509 Heart failure, unspecified: Secondary | ICD-10-CM | POA: Diagnosis present

## 2013-11-26 DIAGNOSIS — F329 Major depressive disorder, single episode, unspecified: Secondary | ICD-10-CM | POA: Diagnosis present

## 2013-11-26 DIAGNOSIS — Z8719 Personal history of other diseases of the digestive system: Secondary | ICD-10-CM

## 2013-11-26 DIAGNOSIS — I739 Peripheral vascular disease, unspecified: Secondary | ICD-10-CM | POA: Diagnosis present

## 2013-11-26 DIAGNOSIS — J4489 Other specified chronic obstructive pulmonary disease: Secondary | ICD-10-CM | POA: Diagnosis present

## 2013-11-26 DIAGNOSIS — F17200 Nicotine dependence, unspecified, uncomplicated: Secondary | ICD-10-CM | POA: Diagnosis present

## 2013-11-26 DIAGNOSIS — I5032 Chronic diastolic (congestive) heart failure: Secondary | ICD-10-CM

## 2013-11-26 DIAGNOSIS — F1910 Other psychoactive substance abuse, uncomplicated: Secondary | ICD-10-CM

## 2013-11-26 DIAGNOSIS — I25119 Atherosclerotic heart disease of native coronary artery with unspecified angina pectoris: Secondary | ICD-10-CM

## 2013-11-26 DIAGNOSIS — G4733 Obstructive sleep apnea (adult) (pediatric): Secondary | ICD-10-CM | POA: Diagnosis present

## 2013-11-26 DIAGNOSIS — I2789 Other specified pulmonary heart diseases: Secondary | ICD-10-CM | POA: Diagnosis present

## 2013-11-26 DIAGNOSIS — I4719 Other supraventricular tachycardia: Secondary | ICD-10-CM | POA: Diagnosis not present

## 2013-11-26 DIAGNOSIS — I5033 Acute on chronic diastolic (congestive) heart failure: Secondary | ICD-10-CM

## 2013-11-26 LAB — I-STAT ARTERIAL BLOOD GAS, ED
Acid-base deficit: 6 mmol/L — ABNORMAL HIGH (ref 0.0–2.0)
Bicarbonate: 19.4 mEq/L — ABNORMAL LOW (ref 20.0–24.0)
O2 Saturation: 93 %
PCO2 ART: 36.1 mmHg (ref 35.0–45.0)
Patient temperature: 98.6
TCO2: 20 mmol/L (ref 0–100)
pH, Arterial: 7.338 — ABNORMAL LOW (ref 7.350–7.450)
pO2, Arterial: 70 mmHg — ABNORMAL LOW (ref 80.0–100.0)

## 2013-11-26 LAB — CBC WITH DIFFERENTIAL/PLATELET
BASOS PCT: 1 % (ref 0–1)
Basophils Absolute: 0 10*3/uL (ref 0.0–0.1)
EOS ABS: 0.4 10*3/uL (ref 0.0–0.7)
Eosinophils Relative: 8 % — ABNORMAL HIGH (ref 0–5)
HCT: 25.7 % — ABNORMAL LOW (ref 39.0–52.0)
HEMOGLOBIN: 8 g/dL — AB (ref 13.0–17.0)
LYMPHS ABS: 1.2 10*3/uL (ref 0.7–4.0)
Lymphocytes Relative: 23 % (ref 12–46)
MCH: 25.1 pg — AB (ref 26.0–34.0)
MCHC: 31.1 g/dL (ref 30.0–36.0)
MCV: 80.6 fL (ref 78.0–100.0)
MONOS PCT: 9 % (ref 3–12)
Monocytes Absolute: 0.4 10*3/uL (ref 0.1–1.0)
Neutro Abs: 3 10*3/uL (ref 1.7–7.7)
Neutrophils Relative %: 59 % (ref 43–77)
PLATELETS: 247 10*3/uL (ref 150–400)
RBC: 3.19 MIL/uL — ABNORMAL LOW (ref 4.22–5.81)
RDW: 17.3 % — ABNORMAL HIGH (ref 11.5–15.5)
WBC: 5.1 10*3/uL (ref 4.0–10.5)

## 2013-11-26 LAB — TROPONIN I: Troponin I: <0.3 ng/mL (ref <0.30)

## 2013-11-26 LAB — BASIC METABOLIC PANEL
Anion gap: 15 (ref 5–15)
BUN: 20 mg/dL (ref 6–23)
CO2: 16 mEq/L — ABNORMAL LOW (ref 19–32)
Calcium: 8.1 mg/dL — ABNORMAL LOW (ref 8.4–10.5)
Chloride: 104 mEq/L (ref 96–112)
Creatinine, Ser: 1.02 mg/dL (ref 0.50–1.35)
GFR, EST AFRICAN AMERICAN: 85 mL/min — AB (ref >90)
GFR, EST NON AFRICAN AMERICAN: 73 mL/min — AB (ref >90)
Glucose, Bld: 96 mg/dL (ref 70–99)
Potassium: 4.3 mEq/L (ref 3.7–5.3)
Sodium: 135 mEq/L — ABNORMAL LOW (ref 137–147)

## 2013-11-26 LAB — I-STAT CG4 LACTIC ACID, ED: Lactic Acid, Venous: 1.12 mmol/L (ref 0.5–2.2)

## 2013-11-26 LAB — I-STAT TROPONIN, ED
TROPONIN I, POC: 0.04 ng/mL (ref 0.00–0.08)
Troponin i, poc: 0.03 ng/mL (ref 0.00–0.08)

## 2013-11-26 LAB — PRO B NATRIURETIC PEPTIDE: Pro B Natriuretic peptide (BNP): 5616 pg/mL — ABNORMAL HIGH (ref 0–125)

## 2013-11-26 LAB — RAPID URINE DRUG SCREEN, HOSP PERFORMED
AMPHETAMINES: NOT DETECTED
Barbiturates: NOT DETECTED
Benzodiazepines: NOT DETECTED
COCAINE: POSITIVE — AB
OPIATES: NOT DETECTED
Tetrahydrocannabinol: NOT DETECTED

## 2013-11-26 LAB — MRSA PCR SCREENING: MRSA by PCR: NEGATIVE

## 2013-11-26 MED ORDER — IPRATROPIUM-ALBUTEROL 0.5-2.5 (3) MG/3ML IN SOLN
3.0000 mL | Freq: Four times a day (QID) | RESPIRATORY_TRACT | Status: DC
  Administered 2013-11-26: 3 mL via RESPIRATORY_TRACT
  Filled 2013-11-26: qty 3

## 2013-11-26 MED ORDER — ONDANSETRON HCL 4 MG/2ML IJ SOLN: 4.0000 mg | Freq: Three times a day (TID) | INTRAMUSCULAR | Status: AC | PRN

## 2013-11-26 MED ORDER — FUROSEMIDE 10 MG/ML IJ SOLN
80.0000 mg | Freq: Once | INTRAMUSCULAR | Status: AC
  Administered 2013-11-27: 80 mg via INTRAVENOUS
  Filled 2013-11-26: qty 8

## 2013-11-26 MED ORDER — CITALOPRAM HYDROBROMIDE 20 MG PO TABS
20.0000 mg | ORAL_TABLET | Freq: Every day | ORAL | Status: DC
  Administered 2013-11-26 – 2013-12-01 (×6): 20 mg via ORAL
  Filled 2013-11-26 (×6): qty 1

## 2013-11-26 MED ORDER — MORPHINE SULFATE 2 MG/ML IJ SOLN
2.0000 mg | INTRAMUSCULAR | Status: DC | PRN
  Administered 2013-11-27 – 2013-11-28 (×7): 2 mg via INTRAVENOUS
  Filled 2013-11-26 (×7): qty 1

## 2013-11-26 MED ORDER — GABAPENTIN 100 MG PO CAPS
100.0000 mg | ORAL_CAPSULE | Freq: Two times a day (BID) | ORAL | Status: DC
  Administered 2013-11-26 – 2013-12-01 (×10): 100 mg via ORAL
  Filled 2013-11-26 (×12): qty 1

## 2013-11-26 MED ORDER — ONDANSETRON HCL 4 MG/2ML IJ SOLN
4.0000 mg | Freq: Once | INTRAMUSCULAR | Status: AC
  Administered 2013-11-26: 4 mg via INTRAVENOUS
  Filled 2013-11-26: qty 2

## 2013-11-26 MED ORDER — SODIUM CHLORIDE 0.9 % IV SOLN
INTRAVENOUS | Status: AC
  Administered 2013-11-26: 11:00:00 via INTRAVENOUS

## 2013-11-26 MED ORDER — HYDROMORPHONE HCL PF 1 MG/ML IJ SOLN: 1.0000 mg | INTRAMUSCULAR | Status: DC | PRN

## 2013-11-26 MED ORDER — PHENYTOIN SODIUM EXTENDED 100 MG PO CAPS: 200.0000 mg | ORAL_CAPSULE | Freq: Two times a day (BID) | ORAL | Status: DC

## 2013-11-26 MED ORDER — PHENYTOIN SODIUM EXTENDED 100 MG PO CAPS
200.0000 mg | ORAL_CAPSULE | Freq: Every morning | ORAL | Status: DC
  Administered 2013-11-27 – 2013-12-01 (×5): 200 mg via ORAL
  Filled 2013-11-26 (×5): qty 2

## 2013-11-26 MED ORDER — PANTOPRAZOLE SODIUM 40 MG PO TBEC
40.0000 mg | DELAYED_RELEASE_TABLET | Freq: Two times a day (BID) | ORAL | Status: DC
  Administered 2013-11-26 – 2013-12-01 (×10): 40 mg via ORAL
  Filled 2013-11-26 (×11): qty 1

## 2013-11-26 MED ORDER — HYDROMORPHONE HCL PF 1 MG/ML IJ SOLN
1.0000 mg | Freq: Once | INTRAMUSCULAR | Status: AC
  Administered 2013-11-26: 1 mg via INTRAVENOUS
  Filled 2013-11-26: qty 1

## 2013-11-26 MED ORDER — ONDANSETRON HCL 4 MG/2ML IJ SOLN
4.0000 mg | Freq: Once | INTRAMUSCULAR | Status: AC
  Administered 2013-11-26: 4 mg via INTRAVENOUS

## 2013-11-26 MED ORDER — ALBUTEROL SULFATE (2.5 MG/3ML) 0.083% IN NEBU
5.0000 mg | INHALATION_SOLUTION | Freq: Once | RESPIRATORY_TRACT | Status: AC
  Administered 2013-11-26: 5 mg via RESPIRATORY_TRACT

## 2013-11-26 MED ORDER — NITROGLYCERIN 0.4 MG SL SUBL
0.4000 mg | SUBLINGUAL_TABLET | SUBLINGUAL | Status: DC | PRN
  Administered 2013-11-26 (×3): 0.4 mg via SUBLINGUAL

## 2013-11-26 MED ORDER — HYDRALAZINE HCL 50 MG PO TABS
50.0000 mg | ORAL_TABLET | ORAL | Status: AC
  Administered 2013-11-26: 50 mg via ORAL
  Filled 2013-11-26: qty 1

## 2013-11-26 MED ORDER — PHENYTOIN SODIUM EXTENDED 100 MG PO CAPS: 200.0000 mg | ORAL_CAPSULE | Freq: Every day | ORAL | Status: DC

## 2013-11-26 MED ORDER — FUROSEMIDE 10 MG/ML IJ SOLN
40.0000 mg | Freq: Once | INTRAMUSCULAR | Status: AC
  Administered 2013-11-26: 40 mg via INTRAVENOUS
  Filled 2013-11-26: qty 4

## 2013-11-26 MED ORDER — ASPIRIN EC 81 MG PO TBEC
81.0000 mg | DELAYED_RELEASE_TABLET | Freq: Every day | ORAL | Status: DC
  Administered 2013-11-26 – 2013-12-01 (×6): 81 mg via ORAL
  Filled 2013-11-26 (×6): qty 1

## 2013-11-26 MED ORDER — NITROGLYCERIN IN D5W 200-5 MCG/ML-% IV SOLN
100.0000 ug/min | INTRAVENOUS | Status: DC
  Administered 2013-11-26: 100 ug/min via INTRAVENOUS

## 2013-11-26 MED ORDER — PHENYTOIN SODIUM EXTENDED 100 MG PO CAPS
300.0000 mg | ORAL_CAPSULE | Freq: Every day | ORAL | Status: DC
  Administered 2013-11-26 – 2013-11-30 (×5): 300 mg via ORAL
  Filled 2013-11-26 (×7): qty 3

## 2013-11-26 MED ORDER — SIMVASTATIN 40 MG PO TABS
40.0000 mg | ORAL_TABLET | Freq: Every evening | ORAL | Status: DC
  Administered 2013-11-26: 40 mg via ORAL
  Filled 2013-11-26 (×2): qty 1

## 2013-11-26 MED ORDER — NITROGLYCERIN IN D5W 200-5 MCG/ML-% IV SOLN
2.0000 ug/min | INTRAVENOUS | Status: DC
  Administered 2013-11-26: 10 ug/min via INTRAVENOUS
  Administered 2013-11-26: 25 ug/min via INTRAVENOUS
  Administered 2013-11-27: 10 ug/min via INTRAVENOUS
  Filled 2013-11-26: qty 250

## 2013-11-26 NOTE — ED Notes (Signed)
Pt now coughing up congestion, clear.

## 2013-11-26 NOTE — ED Notes (Signed)
Dr. Claudine Mouton EDP in to room, no changes, pt remains increased wob, grunting, labored resps, alert, interactive.

## 2013-11-26 NOTE — ED Provider Notes (Signed)
CSN: [id number]     Arrival date & time 11/26/13  0354 History   First MD Initiated Contact with Patient 11/26/13 0424     Chief Complaint  Patient presents with  . Chest Pain     (Consider location/radiation/quality/duration/timing/severity/associated sxs/prior Treatment) HPI  Charles [name] is a 68 y.o. male with past medical history of CHF and COPD coming in with shortness of breath. Patient states this occurred around midnight. He experienced chest pain at the burning sensation on his right and left sides with radiation to his left arm. It is associated shortness of breath and diaphoresis. He admits to subjective fevers as well and a productive cough. Patient states she's been compliant with his CHF medications but missed his dose yesterday. He denies any sick contacts. Patient is currently acutely short of breath speaking in 4-5 word sentences.  10 Systems reviewed and are negative for acute change except as noted in the HPI.    Past Medical History  Diagnosis Date  . Iron deficiency anemia   . Hypertension   . Chronic diastolic heart failure     a. 2D ECHO 2015 50-55% and G2DD  . CAD (coronary artery disease)     a. s/p 3-vessel CABG (12/2007) // 100% RCA stenosis with collaterals from left system. Severe bifurcation lesions of proxima CXA and OM. Moderate LAD diseaseFollowed by Dr[facility]  . Hyperlipidemia LDL goal <70   . Obstructive sleep apnea     a. not on home CPAP  . Ischemic cardiomyopathy     a. EF now normalized (2012 EF 35-40%)  . Chronic kidney disease (CKD), stage III (moderate)     BL SCr 1.5-1.6  . Homelessness   . 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 Polysubstance abuse     a. cocaine, THC  . AV malformation of gastrointestinal tract   . Family history of early CAD   . DDD (degenerative disc disease), lumbar   . Renal artery stenosis   . Peripheral vascular disease   .  Seizure disorder   . Diabetes   . Seizures    Past Surgical History  Procedure Laterality Date  . Coronary artery bypass graft  12/2007  . Apc  03/2010    To treat small bowel AVMs  . Esophagogastroduodenoscopy N/A 08/14/2012    Procedure: ESOPHAGOGASTRODUODENOSCOPY (EGD);  Surgeon: [name], MD;  Location: [facility] ENDOSCOPY;  Service: Endoscopy;  Laterality: N/A;  . Hot hemostasis N/A 08/14/2012    Procedure: HOT HEMOSTASIS (ARGON PLASMA COAGULATION/BICAP);  Surgeon: [name], MD;  Location: [facility] ENDOSCOPY;  Service: Endoscopy;  Laterality: N/A;  . Esophagogastroduodenoscopy (egd) with propofol N/A 08/04/2013    Procedure: ESOPHAGOGASTRODUODENOSCOPY (EGD) WITH PROPOFOL;  Surgeon: [name], MD;  Location: [facility] ENDOSCOPY;  Service: Endoscopy;  Laterality: N/A;   Family History  Problem Relation Age of Onset  . Heart disease Mother     unknown type  . Heart disease Father 93    died of MI at 43yo  . Heart disease Paternal Grandfather 2    died of MI  . Heart disease    . Heart disease Brother 30   History  Substance Use Topics  . Smoking status: Current Some Day Smoker -- 0.20 packs/day for 13 years    Types: Cigarettes    Last Attempt to Quit: 03/20/1997  . Smokeless tobacco: Never Used  . Alcohol Use: Yes  Comment: occasionally drinks, a few times a month    Review of Systems    Allergies  Motrin and Tylenol  Home Medications   Prior to Admission medications   Medication Sig Start Date End Date Taking? Authorizing Provider  amLODipine (NORVASC) 10 MG tablet Take 1 tablet (10 mg total) by mouth daily. 10/18/13  Yes Blain Pais, MD  aspirin EC 81 MG tablet Take 1 tablet (81 mg total) by mouth daily. 08/18/13  Yes [name], MD  chlorpheniramine (CHLOR-TRIMETON) 4 MG tablet Take 1 tablet (4 mg total) by mouth 2 (two) times daily. 10/18/13  Yes Blain Pais, MD  citalopram (CELEXA) 20 MG tablet Take 20 mg by mouth daily.   Yes Historical Provider,  MD  cloNIDine (CATAPRES) 0.3 MG tablet Take 1 tablet (0.3 mg total) by mouth 2 (two) times daily. 10/18/13  Yes Blain Pais, MD  docusate sodium 100 MG CAPS Take 100 mg by mouth 2 (two) times daily. 10/18/13  Yes Blain Pais, MD  ferrous sulfate 325 (65 FE) MG tablet Take 1 tablet (325 mg total) by mouth 3 (three) times daily with meals. 10/18/13  Yes Blain Pais, MD  furosemide (LASIX) 80 MG tablet Take 1 tablet (80 mg total) by mouth daily. 10/18/13  Yes Blain Pais, MD  gabapentin (NEURONTIN) 100 MG capsule Take 1 capsule (100 mg total) by mouth 2 (two) times daily. 07/21/13  Yes Charlynne Cousins, MD  pantoprazole (PROTONIX) 40 MG tablet Take 1 tablet (40 mg total) by mouth 2 (two) times daily. 08/04/13  Yes [name], MD  phenytoin (DILANTIN) 100 MG ER capsule Take 2-3 capsules (200-300 mg total) by mouth 2 (two) times daily. Take 2 capsules by mouth in the morning and 3 capsules by mouth at bedtime. 08/04/13  Yes [name], MD  simvastatin (ZOCOR) 40 MG tablet Take 1 tablet (40 mg total) by mouth every evening. 03/13/13  Yes [name], MD   BP 179/95  Pulse 81  Temp(Src) 98.3 F (36.8 C) (Rectal)  Resp 20  SpO2 99% Physical Exam  Nursing note and vitals reviewed. Constitutional: He is oriented to person, place, and time. Vital signs are normal. He appears well-developed and well-nourished.  Non-toxic appearance. He does not appear ill. He appears distressed.  HENT:  Head: Normocephalic and atraumatic.  Nose: Nose normal.  Mouth/Throat: Oropharynx is clear and moist. No oropharyngeal exudate.  Eyes: Conjunctivae and EOM are normal. Pupils are equal, round, and reactive to light. No scleral icterus.  Neck: Normal range of motion. Neck supple. No tracheal deviation, no edema, no erythema and normal range of motion present. No mass and no thyromegaly present.  Cardiovascular: Normal rate, regular rhythm, S1 normal, S2 normal, normal heart sounds,  intact distal pulses and normal pulses.  Exam reveals no gallop and no friction rub.   No murmur heard. Pulses:      Radial pulses are 2+ on the right side, and 2+ on the left side.       Dorsalis pedis pulses are 2+ on the right side, and 2+ on the left side.  Pulmonary/Chest: He is in respiratory distress. He has wheezes. He has no rhonchi. He has rales.  Tachypnea present. Increased worker breathing and use of accessory muscles noted.  Abdominal: Soft. Normal appearance and bowel sounds are normal. He exhibits no distension, no ascites and no mass. There is no hepatosplenomegaly. There is no tenderness. There is no rebound,  no guarding and no CVA tenderness.  Musculoskeletal: Normal range of motion. He exhibits no edema and no tenderness.  Lymphadenopathy:    He has no cervical adenopathy.  Neurological: He is alert and oriented to person, place, and time. He has normal strength. No cranial nerve deficit or sensory deficit. GCS eye subscore is 4. GCS verbal subscore is 5. GCS motor subscore is 6.  Skin: Skin is warm, dry and intact. No petechiae and no rash noted. He is not diaphoretic. No erythema. No pallor.  Psychiatric: He has a normal mood and affect. His behavior is normal. Judgment normal.    ED Course  Procedures (including critical care time) Labs Review Labs Reviewed  BASIC METABOLIC PANEL - Abnormal; Notable for the following:    Sodium 135 (*)    CO2 16 (*)    Calcium 8.1 (*)    GFR calc non Af Amer 73 (*)    GFR calc Af Amer 85 (*)    All other components within normal limits  CBC WITH DIFFERENTIAL - Abnormal; Notable for the following:    RBC 3.19 (*)    Hemoglobin 8.0 (*)    HCT 25.7 (*)    MCH 25.1 (*)    RDW 17.3 (*)    Eosinophils Relative 8 (*)    All other components within normal limits  PRO B NATRIURETIC PEPTIDE - Abnormal; Notable for the following:    Pro B Natriuretic peptide (BNP) 5616.0 (*)    All other components within normal limits  I-STAT  ARTERIAL BLOOD GAS, ED - Abnormal; Notable for the following:    pH, Arterial 7.338 (*)    pO2, Arterial 70.0 (*)    Bicarbonate 19.4 (*)    Acid-base deficit 6.0 (*)    All other components within normal limits  BLOOD GAS, ARTERIAL  I-STAT TROPOININ, ED  I-STAT CG4 LACTIC ACID, ED  Randolm Idol, ED    Imaging Review Dg Chest Portable 1 View  11/26/2013   CLINICAL DATA:  Chest pain  EXAM: PORTABLE CHEST - 1 VIEW  COMPARISON:  10/23/2013  FINDINGS: Postoperative changes in the mediastinum. Shallow inspiration. Heart size is enlarged and there is pulmonary vascular congestion centrally. Suggestion of mild interstitial edema in the lung bases. No focal consolidation. No pneumothorax. No blunting of costophrenic angles.  IMPRESSION: Cardiac enlargement with central pulmonary vascular congestion and mild interstitial edema.   Electronically Signed   By: Lucienne Capers M.D.   On: 11/26/2013 04:28     EKG Interpretation   Date/Time:  Wednesday November 26 2013 04:02:02 EDT Ventricular Rate:  81 PR Interval:  192 QRS Duration: 114 QT Interval:  436 QTC Calculation: 506 R Axis:   66 Text Interpretation:  Sinus rhythm Inferoposterior infarct, recent  Prolonged QT interval No significant change since last tracing Confirmed  by Glynn Octave [phone]) on 11/26/2013 4:36:54 AM      MDM   Final diagnoses:  None    Patient does emergency department out of concern for shortness of breath. Her vital signs reveal B-lines, my suspicion for a CHF exacerbation is higher than that for COPD. He was started on a nitro drip and given 40 of IV Lasix. Blood gas is currently pending, patient may progress to BiPAP.  Arterial blood gases not reveal hypercarbia, CO2 is 36. Cardiology has been called for possible admission. She will assess the patient emergency department. She continues to have persistent chest pain but states his breathing is much better with a  nitro drip.  Patient was signed  out to oncoming physician.  Please see his note for the ultimate disposition.  I believe the patient is best served on the cardiology service.  If they refuse, patient will be admitted to the general medicine team.  CRITICAL CARE Performed by: Everlene Balls   Total critical care time: 45 minutes  Critical care time was exclusive of separately billable procedures and treating other patients.  Critical care was necessary to treat or prevent imminent or life-threatening deterioration.  Critical care was time spent personally by me on the following activities: development of treatment plan with patient and/or surrogate as well as nursing, discussions with consultants, evaluation of patient's response to treatment, examination of patient, obtaining history from patient or surrogate, ordering and performing treatments and interventions, ordering and review of laboratory studies, ordering and review of radiographic studies, pulse oximetry and re-evaluation of patient's condition.   Everlene Balls, MD 11/26/13 [phone]

## 2013-11-26 NOTE — H&P (Signed)
Date: 11/26/2013               Patient Name:  Charles [name] MRN: [id number]  DOB: September 20, 1945 Age / Sex: 68 y.o., male   PCP: [place] Service: Internal Medicine Teaching Service              Attending Physician: Dr. Aldine Contes, MD    First Contact: Dahlia Bailiff, [place] [address] Pager: [phone]  Second Contact: Dr. Clayburn Pert Pager: [phone]  Third Contact  Pager:        After Hours (After 5p/  First Contact Pager: [phone]  weekends / holidays): Second Contact Pager: [phone]   Contacts:  PCP: [name] Primary Cardiologist: Dr. Wyline Copas at [facility] cardiology Daughter: Dolphus Jenny [phone]  Chief Complaint: Chief Complaint  Patient presents with  . Chest Pain   Acute on chronic diastolic heart failure  HPI: Charles [name] is a 69 year old man with a history of HFpEF, CAD s/p 3VCABG 2009, anemia with h/o GI bleed, and cocaine abuse who presents with chest pain. Reports laying in bed watching TV last night and experiencing sudden onset of 10/10 sharp, diffuse, chest pain possibly worse with position/palpation and shortness of breath. He called EMS and recieved ASA 325 and NTG x3 with minimal relief of pain. In the ED he had minimal relief of chest pain with nitro gtt.  Reports that he had a similar episode one month ago. Per review of chart, had a very similar presentation with sharp chest pain, negative CTA and troponin, ultimately considered 2/2 chronic cough and AoC HFpEF. States that he is still actively using cocaine about 1x/wk with last use on Sunday. States that he usually takes his medications regularly but missed his doses yesterday.   Denies substernal chest pain or discomfort brought on by exertion or relieved with rest. Endorses shortness of breath and recent onset of cough with clear sputum production. Denies history of blood clots or recent travel. Denies headaches, fever. Endorses some nausea with no emesis and resolving abdominal  pain. Denies changes in bowel or bladder function.   Allergies: Motrin and Tylenol  Medications:  Current Facility-Administered Medications  Medication Dose Route Frequency Provider Last Rate Last Dose  . 0.9 %  sodium chloride infusion   Intravenous STAT Fredia Sorrow, MD 10 mL/hr at 11/26/13 1117    . nitroGLYCERIN (NITROSTAT) SL tablet 0.4 mg  0.4 mg Sublingual Q5 min PRN Everlene Balls, MD   0.4 mg at 11/26/13 0450  . nitroGLYCERIN 50 mg in dextrose 5 % 250 mL (0.2 mg/mL) infusion  100 mcg/min Intravenous Titrated Everlene Balls, MD   50 mcg/min at 11/26/13 1020  . nitroGLYCERIN 50 mg in dextrose 5 % 250 mL (0.2 mg/mL) infusion  2-200 mcg/min Intravenous Titrated Josue Hector, MD 3 mL/hr at 11/26/13 1117 10 mcg/min at 11/26/13 1117  . ondansetron (ZOFRAN) injection 4 mg  4 mg Intravenous Q8H PRN Fredia Sorrow, MD       Current Outpatient Prescriptions  Medication Sig Dispense Refill  . amLODipine (NORVASC) 10 MG tablet Take 1 tablet (10 mg total) by mouth daily.  30 tablet  0  . aspirin EC 81 MG tablet Take 1 tablet (81 mg total) by mouth daily.      . chlorpheniramine (CHLOR-TRIMETON) 4 MG tablet Take 1 tablet (4 mg total) by mouth 2 (two) times daily.  14 tablet  0  . citalopram (  CELEXA) 20 MG tablet Take 20 mg by mouth daily.      . cloNIDine (CATAPRES) 0.3 MG tablet Take 1 tablet (0.3 mg total) by mouth 2 (two) times daily.  60 tablet  0  . docusate sodium 100 MG CAPS Take 100 mg by mouth 2 (two) times daily.  60 capsule  0  . ferrous sulfate 325 (65 FE) MG tablet Take 1 tablet (325 mg total) by mouth 3 (three) times daily with meals.  90 tablet  3  . furosemide (LASIX) 80 MG tablet Take 1 tablet (80 mg total) by mouth daily.  30 tablet  6  . gabapentin (NEURONTIN) 100 MG capsule Take 1 capsule (100 mg total) by mouth 2 (two) times daily.  60 capsule  2  . pantoprazole (PROTONIX) 40 MG tablet Take 1 tablet (40 mg total) by mouth 2 (two) times daily.  60 tablet  0  . phenytoin  (DILANTIN) 100 MG ER capsule Take 2-3 capsules (200-300 mg total) by mouth 2 (two) times daily. Take 2 capsules by mouth in the morning and 3 capsules by mouth at bedtime.  90 capsule  2  . simvastatin (ZOCOR) 40 MG tablet Take 1 tablet (40 mg total) by mouth every evening.  30 tablet  1     Medical History: Past Medical History  Diagnosis Date  . Iron deficiency anemia   . Chronic diastolic heart failure     a. 2D ECHO 2015 50-55% and G2DD  . Hyperlipidemia LDL goal <70   . Obstructive sleep apnea     a. not on home CPAP  . Ischemic cardiomyopathy     a. EF now normalized (2012 EF 35-40%)  . Homelessness   . 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 Polysubstance abuse     a. cocaine, THC  . AV malformation of gastrointestinal tract   . Family history of early CAD   . DDD (degenerative disc disease), lumbar   . Peripheral vascular disease   . Seizure disorder   . Seizures   . Hypertension   . CAD (coronary artery disease)     a. s/p 3-vessel CABG (12/2007) // 100% RCA stenosis with collaterals from left system. Severe bifurcation lesions of proxima CXA and OM. Moderate LAD diseaseFollowed by [facility]  . Diabetes   . Chronic kidney disease (CKD), stage III (moderate)     BL SCr 1.5-1.6  . Renal artery stenosis     Surgical History: Past Surgical History  Procedure Laterality Date  . Apc  03/2010    To treat small bowel AVMs  . Esophagogastroduodenoscopy N/A 08/14/2012    Procedure: ESOPHAGOGASTRODUODENOSCOPY (EGD);  Surgeon: [name], MD;  Location: [facility] ENDOSCOPY;  Service: Endoscopy;  Laterality: N/A;  . Hot hemostasis N/A 08/14/2012    Procedure: HOT HEMOSTASIS (ARGON PLASMA COAGULATION/BICAP);  Surgeon: [name], MD;  Location: [facility] ENDOSCOPY;  Service: Endoscopy;  Laterality: N/A;  . Esophagogastroduodenoscopy (egd) with propofol N/A 08/04/2013    Procedure: ESOPHAGOGASTRODUODENOSCOPY (EGD)  WITH PROPOFOL;  Surgeon: [name], MD;  Location: [facility] ENDOSCOPY;  Service: Endoscopy;  Laterality: N/A;  . Coronary artery bypass graft  12/2007    Social History: History   Social History  . Marital Status: Single    Spouse Name: N/A    Number of Children: 2  . Years of Education: N/A   Occupational History  . Unemployed  previously worked in [employer]  .     Social History Main Topics  . Smoking status: Current Some Day Smoker -- 0.25 packs/day for 56 years    Types: Cigarettes  . Smokeless tobacco: Never Used  . Alcohol Use: Yes     Comment: occasionally drinks, a few times a month  . Drug Use: 1.00 per week    Special: Cocaine, Marijuana  . Sexual Activity: No   Other Topics Concern  . Not on file   Social History Narrative   Lives in [place]. Originally from [place], has been in the [place] 1980s                   Family History: Family History  Problem Relation Age of Onset  . Heart disease Mother     unknown type  . Heart disease Father 36    died of MI at 64yo  . Heart disease Paternal Grandfather 67    died of MI  . Heart disease    . Heart disease Brother 30    Review of Systems: 10 systems reviewed and are negative unless otherwise mentioned in HPI.  Labs/Studies: @[id number] @,  Intake/Output Summary (Last 24 hours) at 11/26/13 1153 Last data filed at 11/26/13 1022  Gross per 24 hour  Intake      0 ml  Output   1250 ml  Net  -1250 ml    Physical Exam: General: awake and alert gentleman sitting upright in bed in mild respiratory distress. Skin: normal turgur, scattered 1x1 cm purpuric lesions HEENT: NCAT, EOMI, sclera mildly icteric,  Neck: JVD 5 cm above sternal notch Cardiovascular: Pulse regular rate and rhythm, S1 with split S2, no murmurs appreciated Pulmonary: mildly increased work of breathing, diffusely decreased aeration, bibasialar predominant crackles with diffuse intermittent  wheezes Abdomen: Soft, moderately distended, +BS, no masses appreciated, no HSM Extremities: 1+ bilateral lower extremity edema, equal in diameter, 2+ peripheral pulses in all 4 extremities Neuro: no focal deficits noted  Assessment/Plan:  Principal Problem:   Acute on chronic diastolic heart failure Active Problems:   Iron deficiency anemia   CAD (coronary artery disease)   Chronic kidney disease (CKD), stage III (moderate)   Polysubstance abuse   Unstable angina   Chest pain   Hypertensive urgency  Charles [name] is a 68 year old man with a history of HFpEF, CAD s/p 3VCABG 2009, anemia with h/o GI bleed, and cocaine abuse who presents with chest pain likely 2/2 to acutely decompensated HFpEF.   Chest pain: Reports sudden onset 10/10 sharp chest pain possibly positional associated with dyspnea. Etiology unclear but likely musculoskeletal etiology given nature of pain. Per discharge summary from 7/31, pt presented with similar symptoms felt to be MSK in nature 2/2 chronic cough exacerbated by a chronic cough uncontrolled with PPI therapy (although adherence questionable) and was discharged on chlorpheniramine. ACS unlikely given sharp quality of pain, negative troponin and EKG changes, and pain unprovoked by activity and was not at all relieved by aggressive nitroglycerin treatment. Also of concern is PE given sharp, seemingly pleuritic chest pain with tachypnea and mild hypoxia. However, Wells score 0 and prior admission with similar symptomatology and negative CTA makes PE less likely. Placed on nitro gtt in ED with minimal relief.  - Wean down nitro gtt and plan to transition to Imdur tomorrow - Continue to trend troponins until undetectable - AM EKG - nitrostat tablet prn - per cardiology note, potential left heart catheterization later  this week. Pt may not be candidate for percutaneous intervention given need remote history of GI bleed and medication non-adherence  Acutely  decompensated HFpEF: 4/29 ECHO with normal systolic function, mild hypokinesis inferiorly, and grade 2 diastolic dysfunction. Pt endorses taking his medications fairly regularly with intermittent missed doses, the most recent missed dose of home lasix 80 po daily was yesterday. Last discharge weight was 78 kg. On admission, patient clinically hypervolemic (JVD, BLE edema, bibasilar crackles). Mildly hypoxic with pO2 70 and CXR with central pulmonary edema. BNP 5616 up from 2904 one month ago. Per chart review, has had multiple admissions for decompensated heart failure. Possible that persistent cocaine is resulting in use drug induced-tachycardia and hypertension resulting in acute decompensated heart failure. - diurese with 80 IV BID - daily BMP, replete lytes prn - daily weights; presumed dry wt of 78 kg - strict I/O  Hypertensive Urgency: Pt with BP 184/109 on admission. Per chart review, BP was up to 204/89 during last hospitalization. Was discharged on amlodipine 10, clonidine 0.3, and lasix 80 daily. Staying away from beta-blocker therapy given cocaine use. Pt given hydralazine 50 PO in ED with good response in blood pressure.  - continue home meds with hydralazine prn  COPD: Pt has significant smoking history with mention of COPD in chart review but denies having taken any controller or rescue medications in the past. Exam significant for wheezing which may be 2/2 decompensated heart failure, mildly hypoxic, with some new onset sputum production. - duonebs q6h prn, monitor for tachycardia  Anemia: Hb 8.0 on admission which was stable with his discharge Hb of 8.0 on 7/29. Per chart review, pt has long history of Hb 7-9. Has history of upper GI AVM bleed in 2012 s/p EGD revealing not actively bleeding AVM now s/p ablation. Iron studies during last hospitalization c/w iron deficiency anemia with no obvious chronic bleeding source.  - FOBT - pantoprazole 40 daily for GI ppx and h/o bleeding AVM -  daily CBC - type and screen  CAD s/p 3VCABG 2009: Denies anginal chest pain. Troponin negative and no EKG changes. Last left heart cath unknown. - Obtain records from [facility] where he has his CABG - continue ASA 81 and simvastatin 40 daily  Depression: - Celexa 20 daily  H/o Seizures: Last reported seizure 3 years ago. Has been controlled on dilantin. Does not see a neurologist regularly. - dilantin 200-300 mg BID  This is a Careers information officer Note.  The care of the patient was discussed with Dr. Dareen Piano and the assessment and plan was formulated with their assistance.  Please see their note for official documentation of the patient encounter.

## 2013-11-26 NOTE — ED Notes (Signed)
Paged cardiology 

## 2013-11-26 NOTE — H&P (Signed)
Date: 11/26/2013               Patient Name:  Charles [name] MRN: [id number]  DOB: 1945/05/12 Age / Sex: 68 y.o., male   PCP: [name]              Medical Service: Internal Medicine Teaching Service              Attending Physician: Dr. Aldine Contes, MD    First Contact: Dr. Dahlia Bailiff MS IV Pager: [phone]  Second Contact: Dr. Clayburn Pert Pager: [phone]            After Hours (After 5p/  First Contact Pager: [phone]  weekends / holidays): Second Contact Pager: [phone]   Chief Complaint: Shortness of breath since last night  History of Present Illness: 68 year old man with a history of HFpEF, CAD s/p 3VCABG 2009, anemia with h/o GI bleed, and cocaine abuse who presents with chest pain. Reports laying in bed watching TV last night and experiencing sudden onset of 10/10 sharp, diffuse, chest pain possibly worse with position/palpation and shortness of breath. He called EMS and recieved ASA 325 and NTG x3 with minimal relief of pain. In the ED he had minimal relief of chest pain with nitro gtt.  Reports that he had a similar episode one month ago. Per review of chart, had a very similar presentation with sharp chest pain, negative CTA and troponin, ultimately considered 2/2 chronic cough and AoC HFpEF. States that he is still actively using cocaine about 1x/wk with last use on Sunday > 3 days prior to his presentation. States that he usually takes his medications regularly but missed his doses yesterday.   Denies substernal chest pain or discomfort brought on by exertion or relieved with rest. Endorses shortness of breath and recent onset of cough with clear sputum production. Denies history of blood clots or recent travel. Denies headaches, fever. Endorses some nausea with no emesis and resolving abdominal pain. Denies changes in bowel or bladder function.   Review of Systems: Constitutional: Denies fever, chills, diaphoresis, appetite change and fatigue.  Respiratory:  Reports coughing with white sputum production. No hemoptysis Gastrointestinal: Denies nausea, vomiting, abdominal pain, diarrhea, constipation, blood in stool and abdominal distention.  Genitourinary: Denies dysuria, urgency, frequency, hematuria, flank pain and difficulty urinating.  Musculoskeletal: Denies myalgias, back pain, joint swelling, arthralgias and gait problem.  Skin: Denies pallor, rash and wound.  Neurological: Denies dizziness, seizures, syncope, weakness, lightheadedness, numbness and headaches.  Hematological: Denies adenopathy. Easy bruising, personal or family bleeding history  Psychiatric/Behavioral: Denies suicidal ideation, mood changes, confusion, nervousness, sleep disturbance and agitation  Allergies: Allergies as of 11/26/2013 - Review Complete 11/26/2013  Allergen Reaction Noted  . Motrin [ibuprofen] Other (See Comments) 05/16/2010  . Tylenol [acetaminophen] Other (See Comments) 05/16/2010   Past Medical History  Diagnosis Date  . Iron deficiency anemia   . Chronic diastolic heart failure     a. 2D ECHO 2015 50-55% and G2DD  . Hyperlipidemia LDL goal <70   . Obstructive sleep apnea     a. not on home CPAP  . Ischemic cardiomyopathy     a. EF now normalized (2012 EF 35-40%)  . Homelessness   . 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 Polysubstance abuse     a. cocaine, THC  . AV malformation of gastrointestinal tract   . Family history of early  CAD   . DDD (degenerative disc disease), lumbar   . Peripheral vascular disease   . Seizure disorder   . Seizures   . Hypertension   . CAD (coronary artery disease)     a. s/p 3-vessel CABG (12/2007) // 100% RCA stenosis with collaterals from left system. Severe bifurcation lesions of proxima CXA and OM. Moderate LAD diseaseFollowed by Dr. Wyline Copas in [facility]  . Diabetes   . Chronic kidney disease (CKD), stage III (moderate)     BL SCr 1.5-1.6    . Renal artery stenosis    Past Surgical History  Procedure Laterality Date  . Apc  03/2010    To treat small bowel AVMs  . Esophagogastroduodenoscopy N/A 08/14/2012    Procedure: ESOPHAGOGASTRODUODENOSCOPY (EGD);  Surgeon: [name], MD;  Location[facility] ENDOSCOPY;  Service: Endoscopy;  Laterality: N/A;  . Hot hemostasis N/A 08/14/2012    Procedure: HOT HEMOSTASIS (ARGON PLASMA COAGULATION/BICAP);  Surgeon: [name], MD;  Location: [facility] ENDOSCOPY;  Service: Endoscopy;  Laterality: N/A;  . Esophagogastroduodenoscopy (egd) with propofol N/A 08/04/2013    Procedure: ESOPHAGOGASTRODUODENOSCOPY (EGD) WITH PROPOFOL;  Surgeon: [name], MD;  Location: [facility] ENDOSCOPY;  Service: Endoscopy;  Laterality: N/A;  . Coronary artery bypass graft  12/2007   Family History  Problem Relation Age of Onset  . Heart disease Mother     unknown type  . Heart disease Father 17    died of MI at 47yo  . Heart disease Paternal Grandfather 66    died of MI  . Heart disease    . Heart disease Brother 30   History   Social History  . Marital Status: Single    Spouse Name: N/A    Number of Children: 2  . Years of Education: N/A   Occupational History  . Unemployed     previously worked in [employer]  .     Social History Main Topics  . Smoking status: Current Some Day Smoker -- 0.25 packs/day for 56 years    Types: Cigarettes  . Smokeless tobacco: Never Used  . Alcohol Use: Yes     Comment: occasionally drinks, a few times a month  . Drug Use: 1.00 per week    Special: Cocaine, Marijuana  . Sexual Activity: No   Other Topics Concern  . Not on file   Social History Narrative   Lives in [place]. Originally from [place], has been in the [place] since 1980s                     Physical Exam: Blood pressure 141/80, pulse 79, temperature 98.3 F (36.8 C), temperature source Rectal, resp. rate 19, SpO2 97.00%.  General: awake and alert gentleman sitting upright in bed in  mild respiratory distress.  Skin: normal turgur, scattered 1x1 cm purpuric lesions  HEENT: NCAT, EOMI, sclera mildly icteric,  Neck: JVD 5 cm above sternal notch  Cardiovascular: Pulse regular rate and rhythm, S1 with split S2, no murmurs appreciated  Pulmonary: mildly increased work of breathing, diffusely decreased aeration, bibasialar predominant crackles with diffuse intermittent wheezes  Abdomen: Soft, moderately distended, +BS, no masses appreciated, no HSM  Extremities: 1+ bilateral lower extremity edema, equal in diameter, 2+ peripheral pulses in all 4 extremities  Neuro: no focal deficits noted  Lab results: Basic Metabolic Panel:  Recent Labs  11/26/13 0430  NA 135*  K 4.3  CL 104  CO2 16*  GLUCOSE 96  BUN 20  CREATININE  1.02  CALCIUM 8.1*   CBC:  Recent Labs  11/26/13 0430  WBC 5.1  NEUTROABS 3.0  HGB 8.0*  HCT 25.7*  MCV 80.6  PLT 247   BNP:  Recent Labs  11/26/13 0430  PROBNP 5616.0*   Urine Drug Screen: Drugs of Abuse     Component Value Date/Time   LABOPIA NONE DETECTED 11/26/2013 1023   COCAINSCRNUR POSITIVE* 11/26/2013 1023   LABBENZ NONE DETECTED 11/26/2013 1023   AMPHETMU NONE DETECTED 11/26/2013 1023   THCU NONE DETECTED 11/26/2013 1023   LABBARB NONE DETECTED 11/26/2013 1023    Misc. Labs: Lactic acid level 1.12  Imaging results:  Dg Chest Portable 1 View  11/26/2013   CLINICAL DATA:  Chest pain  EXAM: PORTABLE CHEST - 1 VIEW  COMPARISON:  10/23/2013  FINDINGS: Postoperative changes in the mediastinum. Shallow inspiration. Heart size is enlarged and there is pulmonary vascular congestion centrally. Suggestion of mild interstitial edema in the lung bases. No focal consolidation. No pneumothorax. No blunting of costophrenic angles.  IMPRESSION: Cardiac enlargement with central pulmonary vascular congestion and mild interstitial edema.   Electronically Signed   By: Lucienne Capers M.D.   On: 11/26/2013 04:28    Other results: EKG: unchanged from  previous tracings, prolonged QT interval. Inferoposterior Q waves. No acute ST or T wave changes  Assessment & Plan by Problem: Principal Problem:   Acute on chronic diastolic heart failure Active Problems:   Iron deficiency anemia   CAD (coronary artery disease)   Chronic kidney disease (CKD), stage III (moderate)   Polysubstance abuse   Unstable angina   Chest pain   Gastric AVM   Malignant hypertension   History of GI bleed   Unstable angina: In the setting of recent cocaine abuse, malignant hypertension, questionable medication noncompliance, CAD, status post CABG in 1308, and diastolic heart failure. EKG, and troponins, are negative. Chest pain is substernal in nature and did not respond to sublingual nitroglycerin. Chest x-ray revealed cardiomegaly with central pulmonary vascular congestion and mild interstitial edema. There was no pneumonia. Cocaine induced coronary spasms can present like this. Well score 1.5 points for PE > Low risk. Actually, he had a CTA of the chest 4 weeks back, which was negative for pulmonary embolism. Cardiology has evaluated the patient and recommended nitroglycerin drip for blood pressure control and chest pain relief. There was a mention of consideration of cardiac cath sometime later this week. Due to his history of GI bleed, is not a candidate for anticoagulation with heparin.  Plan. - Admit to step down unit. - Continue with nitroglycerin drip for BP control. -Trained troponins. - Obtain a morning EKG - Requests [facility] from [facility] - management per cardiology - Supplement her oxygen - Trial morphine if chest pain remains uncontrolled, with nitroglycerin drip - Due to his poor medication noncompliance, history of GI bleed, this patient is unlikely to be a good candidate for dual antiplatelet therapy if he required coronary stenting.  - Will obtain FOBT  Malignant hypertension: In the setting of cocaine use. On the present. Patient. Blood  pressure was 195/116. Chest x-ray, revealing pulmonary edema, and also her evidence to chest pain, which points towards malignant hypertension. At home, he takes amlodipine 10 mg daily, clonidine 0.3 mg twice daily, and furosemide 80 mg daily. He also reports to be on hydralazine. He last took his medications 2 days prior to presentation. Plan. -Nitroglycerin drip -Will hold his home medications for now - Can resume medications  once he is stable  Acute on chronic diastolic heart failure: Echocardiogram from 06/2013 revealed normal. Systolic function of 93-26%, but with a grade 2 diastolic dysfunction. JVD is elevated physical exam, and in addition to chest x-ray findings of pulmonary edema, this is most likely acute on chronic diastolic heart failure. Patient said this is compliant with Lasix 80 mg daily. Plan. - He received IV Lasix 80 mg in the ED - Evaluate volume status in the morning and start appropriate lasix dose - Will monitor ins and outs closely. - Monitor electrolytes and replace as needed. - Daily weights.  History of GI bleed: Denies current rectal bleeding. Hb 8.0 on admission which was stable with his discharge Hb of 8.0 on 7/29. Baseline Hb 7-9. Has history of upper GI AVM bleed in 2012 s/p EGD revealing not actively bleeding AVM now s/p ablation. Iron studies during last hospitalization c/w iron deficiency anemia with no obvious chronic bleeding source Plan  -  continue with iron supplementation. - FOBT -Will consult GI with question of safety of dual antiplatelet therapy in case patient gets a coronary stent   Chronic normocytic, normochromic Anemia: Baseline hemoglobin between 7-9.  Recent anemia panel revealed iron level of 16, ferritin of 34, with a TIBC of 382. Folate level was normal.  Plan. -Continue with iron supplementation as above.  Cocaine abuse: Counseled patient about cessation of cocaine use. Patient is willing to quit.  Plan. -Consult Research officer, political party  Increased anion gap metabolic acidosis: Anion gap of 15. BMP revealed CO2 of 16. Baseline CO2 otherwise normal. Lactic acid 1.12. He does not endorse ingestion of toxins. Atrial blood gas revealed a pH of 7.33, PCO2 of 36.1, with a PO2 of 70. Picture is consistent with a metabolic acidosis query cause. Plan. - Monitor BMPs - Can consider further evaluation if acidosis persists  Dispo: Disposition is deferred at this time, awaiting improvement of current medical problems. Anticipated discharge in approximately 2-3 day(s).   The patient does have a current PCP [name]), therefore is require OPC follow-up after discharge.   The patient does not have transportation limitations that hinder transportation to clinic appointments.   Signed:  Jessee Avers, MD PGY-3 Internal Medicine Teaching Service Pager: [phone] (7pm-7am) 11/26/2013, 1:25 PM

## 2013-11-26 NOTE — ED Notes (Signed)
Pt states, EMS gave 3 ntg and ASA 324mg 

## 2013-11-26 NOTE — Consult Note (Signed)
CARDIOLOGY CONSULT NOTE       Patient ID: Charles [name] MRN: [id number] DOB/AGE: October 09, 1945 68 y.o.  Admit date: 11/26/2013 Referring Physician:  Claudine Mouton Primary Physician: [name] Primary Cardiologist:  Hilty/Bensimohn/Hochrein initially in 2010 Reason for Consultation: Chest Pain  Active Problems:   * No active hospital problems. *   HPI:   68 yo with history of ongoing drug/cocaine abuse.  SSCP and dyspnea starting at midnight.  Admits to smoking cocaine on Sunday.  Describes pain as 8/10 despite high dose iv nitro.  Enzymes negative and ECG no acute changes  Also dyspnea and coughing last night.  Still with some difficulty breathing  Last echo 4/15  EF 50-55%  Estimated PA pressure 51 mmHg  History of CABG in 2009  At [facility] records not available  No fever, some sputum  CXR and BNP consistant with CHF flair  History of sleep apnea but does not wear CPAP   ROS All other systems reviewed and negative except as noted above  Past Medical History  Diagnosis Date  . Iron deficiency anemia   . Hypertension   . Chronic diastolic heart failure     a. 2D ECHO 2015 50-55% and G2DD  . CAD (coronary artery disease)     a. s/p 3-vessel CABG (12/2007) // 100% RCA stenosis with collaterals from left system. Severe bifurcation lesions of proxima CXA and OM. Moderate LAD diseaseFollowed by Dr. Wyline Copas in [facility]  . Hyperlipidemia LDL goal <70   . Obstructive sleep apnea     a. not on home CPAP  . Ischemic cardiomyopathy     a. EF now normalized (2012 EF 35-40%)  . Chronic kidney disease (CKD), stage III (moderate)     BL SCr 1.5-1.6  . Homelessness   . 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 Polysubstance abuse     a. cocaine, THC  . AV malformation of gastrointestinal tract   . Family history of early CAD   . DDD (degenerative disc disease), lumbar   . Renal artery stenosis   . Peripheral vascular  disease   . Seizure disorder   . Diabetes   . Seizures     Family History  Problem Relation Age of Onset  . Heart disease Mother     unknown type  . Heart disease Father 16    died of MI at 41yo  . Heart disease Paternal Grandfather 30    died of MI  . Heart disease    . Heart disease Brother 30    History   Social History  . Marital Status: Single    Spouse Name: N/A    Number of Children: 2  . Years of Education: N/A   Occupational History  . Unemployed     previously worked in [employer]  .     Social History Main Topics  . Smoking status: Current Some Day Smoker -- 0.20 packs/day for 13 years    Types: Cigarettes    Last Attempt to Quit: 03/20/1997  . Smokeless tobacco: Never Used  . Alcohol Use: Yes     Comment: occasionally drinks, a few times a month  . Drug Use: 1.00 per week    Special: Cocaine, Marijuana  . Sexual Activity: No   Other Topics Concern  . Not on file   Social History Narrative   Lives in [place]. Originally from [place], has  been in the [place] since 1980s                   Past Surgical History  Procedure Laterality Date  . Coronary artery bypass graft  12/2007  . Apc  03/2010    To treat small bowel AVMs  . Esophagogastroduodenoscopy N/A 08/14/2012    Procedure: ESOPHAGOGASTRODUODENOSCOPY (EGD);  Surgeon: [name], MD;  Location: [facility] ENDOSCOPY;  Service: Endoscopy;  Laterality: N/A;  . Hot hemostasis N/A 08/14/2012    Procedure: HOT HEMOSTASIS (ARGON PLASMA COAGULATION/BICAP);  Surgeon: [name], MD;  Location[facility] ENDOSCOPY;  Service: Endoscopy;  Laterality: N/A;  . Esophagogastroduodenoscopy (egd) with propofol N/A 08/04/2013    Procedure: ESOPHAGOGASTRODUODENOSCOPY (EGD) WITH PROPOFOL;  Surgeon: [name], MD;  Location: [facility] ENDOSCOPY;  Service: Endoscopy;  Laterality: N/A;     . furosemide  40 mg Intravenous Once  . hydrALAZINE  50 mg Oral NOW  . HYDROmorphone  1 mg Intravenous Once  . ondansetron  4  mg Intravenous Once   . nitroGLYCERIN 50 mcg/min (11/26/13 1020)  . nitroGLYCERIN      Physical Exam: Blood pressure 179/109, pulse 74, temperature 98.3 F (36.8 C), temperature source Rectal, resp. rate 24, SpO2 99.00%.   Affect appropriate Desheveled black male  HEENT: normal Neck supple with no adenopathy JVP elevated  no bruits no thyromegaly Lungs diffuse fine crackles and end esp wheezing and good diaphragmatic motion Heart:  S1/S2 no murmur, no rub, gallop or click PMI normal Abdomen: benighn, BS positve, no tenderness, no AAA no bruit.  No HSM or HJR Distal pulses intact with no bruits No edema Neuro non-focal Skin warm and dry No muscular weakness   Labs:   Lab Results  Component Value Date   WBC 5.1 11/26/2013   HGB 8.0* 11/26/2013   HCT 25.7* 11/26/2013   MCV 80.6 11/26/2013   PLT 247 11/26/2013    Recent Labs Lab 11/26/13 0430  NA 135*  K 4.3  CL 104  CO2 16*  BUN 20  CREATININE 1.02  CALCIUM 8.1*  GLUCOSE 96   Lab Results  Component Value Date   CKTOTAL 41 05/17/2010   CKMB 2.1 05/17/2010   TROPONINI <0.30 10/16/2013    Lab Results  Component Value Date   CHOL 88 10/15/2013   CHOL 108 06/14/2012   CHOL  Value: 59        ATP III CLASSIFICATION:  <200     mg/dL   Desirable  200-239  mg/dL   Borderline High  >=240    mg/dL   High        03/25/2010   Lab Results  Component Value Date   HDL 32* 10/15/2013   HDL 43 06/14/2012   HDL 15* 03/25/2010   Lab Results  Component Value Date   LDLCALC 42 10/15/2013   LDLCALC 48 06/14/2012   LDLCALC  Value: 33        Total Cholesterol/HDL:CHD Risk Coronary Heart Disease Risk Table                     Men   Women  1/2 Average Risk   3.4   3.3  Average Risk       5.0   4.4  2 X Average Risk   9.6   7.1  3 X Average Risk  23.4   11.0        Use the calculated Patient Ratio above and the CHD Risk Table to  determine the patient's CHD Risk.        ATP III CLASSIFICATION (LDL):  <100     mg/dL   Optimal  100-129  mg/dL   Near or  Above                    Optimal  130-159  mg/dL   Borderline  160-189  mg/dL   High  >190     mg/dL   Very High 03/25/2010   Lab Results  Component Value Date   TRIG 69 10/15/2013   TRIG 85 06/14/2012   TRIG 54 03/25/2010   Lab Results  Component Value Date   CHOLHDL 2.8 10/15/2013   CHOLHDL 2.5 06/14/2012   CHOLHDL 3.9 03/25/2010   No results found for this basename: LDLDIRECT      Radiology: Dg Chest Portable 1 View  11/26/2013   CLINICAL DATA:  Chest pain  EXAM: PORTABLE CHEST - 1 VIEW  COMPARISON:  10/23/2013  FINDINGS: Postoperative changes in the mediastinum. Shallow inspiration. Heart size is enlarged and there is pulmonary vascular congestion centrally. Suggestion of mild interstitial edema in the lung bases. No focal consolidation. No pneumothorax. No blunting of costophrenic angles.  IMPRESSION: Cardiac enlargement with central pulmonary vascular congestion and mild interstitial edema.   Electronically Signed   By: Lucienne Capers M.D.   On: 11/26/2013 04:28    EKG:  SR ICRBBB possible old IPMI  No acute changes   ASSESSMENT AND PLAN:  Chest pain:  History of CABG [facility].  No acute ECG changes and negative enzymes.  No indication for urgent cath. Would Rx BP and CHF at this time and not anticoagulate given anemia and history of GI bleed.  Need records from [facility]  Consider cath latter in week  CHF:  IV nitro for preload reduction.  40 mg iv lasix just given in ER  Oxygen  Hydralazine 50 q6  Avoid beta blockers with recent cocaine use  Follow BMET and CXR  Anemia:  History of GI bleed guaic stools consider consulting Dr Collene Mares if positive   Will need stepdown bed.  SignedJenkins Rouge 11/26/2013, 10:35 AM

## 2013-11-26 NOTE — Progress Notes (Signed)
RT collected ABG and gave results to MD

## 2013-11-26 NOTE — ED Notes (Signed)
C/o feeling cold, denies fever, no fever at this time, NSR, tachypneic and grunting.

## 2013-11-26 NOTE — ED Notes (Signed)
Dr. Claudine Mouton in to see pt, at [facility].

## 2013-11-26 NOTE — ED Provider Notes (Signed)
Patient without significant help from the high nitroglycerin levels. Outpatient chest x-ray shows some mild pulmonary edema. Cardiology is now seen the patient. Internal medicine will admit. We'll go ahead and start to reduce the nitroglycerin which drip which is at very high levels back down to more reasonable levels. We'll treat pain with pain medication get a urine drug screen. Patient's had problems with cocaine abuse in the past. In addition to the high nitroglycerin drip levels blood pressure is still markedly elevated with systolics around 111-552. Repeat troponin ordered as well. Temporary admit orders completed.  Fredia Sorrow, MD 11/26/13 1045

## 2013-11-26 NOTE — ED Notes (Addendum)
PER EMS: pt from home, reports generalized sharp CP that started around midnight, worse with palpation. Pt also reports SOB, RR-24, labored breathing, 100% RA. A&Ox4.

## 2013-11-26 NOTE — ED Notes (Addendum)
Calmer, breathing easier, able to rest/ sleep with eyes closed, remains mildly labored. RT called for ABG.

## 2013-11-26 NOTE — H&P (Signed)
Pt seen and examined with Dr. Alice Rieger. Please refer to resident note for details.  In brief, 68 y/o male with PMH of CAD s/p CABG, chronic diastolic HF,chronic anemia, cocaine abuse, HTN, seizure d/o, CKD stage 3 who p/w CP * 1 day. Pt states that he took cocaine on Sunday and was feeling well on Monday but yesterday developed sudden onset CP. CP was sharp, 8/10, over the middle and left of his chest radiating to his left shoulder. It was assoc with diaphoresis. Pt also complained of associated cough productive of whitish sputum. No fevers, no hemoptysis, no abd pain, no diarrhea. Pt c/o of vomiting up some whitish phlegm. No fevers, no lightheadedness/syncope, no HA, no blurry vision, no focal weakness. Remaining ROS negative  Exam: Gen: AAO*3, NAD Cardio- RRR, normal heart sounds Lungs- bibasilar crackles, scattered wheezes + Abd- soft, Non tender, BS + Ext- b/l pitting LE edema  Assessment and Plan: Pt is a 68 y/o male with CP likely secondary to cocaine abuse, accelerated HTN, acute on chronic diastolic HF  CP/angina: - Pt to be admitted to step down - Cardio following - c/w nitro gtt for now. F/u troponins *3 sets - repeat EKG in AM - Avoid beta blockers secondary to cocaine abuse - Would attempt to avoid opiates if possible - Pt likely a poor candidate for stenting given h/o non compliance - Would consider adding hydralazine for BP control  Acute on chronic diastolic HF: - Would c/w lasix 80 mg IV for diuresis - Strict I's and O's, daily weights - PO fluid restriction 1.5 litres  Chronic normocytic anemia: - Hg at baseline. Will monitor - Check FOBT  Cocaine abuse: - Pt advised to quit cocaine - will contact SW

## 2013-11-27 DIAGNOSIS — I509 Heart failure, unspecified: Secondary | ICD-10-CM

## 2013-11-27 DIAGNOSIS — I359 Nonrheumatic aortic valve disorder, unspecified: Secondary | ICD-10-CM

## 2013-11-27 LAB — BASIC METABOLIC PANEL
ANION GAP: 13 (ref 5–15)
BUN: 24 mg/dL — ABNORMAL HIGH (ref 6–23)
CO2: 19 mEq/L (ref 19–32)
CREATININE: 1.17 mg/dL (ref 0.50–1.35)
Calcium: 8.3 mg/dL — ABNORMAL LOW (ref 8.4–10.5)
Chloride: 103 mEq/L (ref 96–112)
GFR calc non Af Amer: 62 mL/min — ABNORMAL LOW (ref >90)
GFR, EST AFRICAN AMERICAN: 72 mL/min — AB (ref >90)
Glucose, Bld: 100 mg/dL — ABNORMAL HIGH (ref 70–99)
POTASSIUM: 4.5 meq/L (ref 3.7–5.3)
Sodium: 135 mEq/L — ABNORMAL LOW (ref 137–147)

## 2013-11-27 LAB — CBC
HCT: 27.3 % — ABNORMAL LOW (ref 39.0–52.0)
Hemoglobin: 8.2 g/dL — ABNORMAL LOW (ref 13.0–17.0)
MCH: 24 pg — AB (ref 26.0–34.0)
MCHC: 30 g/dL (ref 30.0–36.0)
MCV: 79.8 fL (ref 78.0–100.0)
PLATELETS: 268 10*3/uL (ref 150–400)
RBC: 3.42 MIL/uL — ABNORMAL LOW (ref 4.22–5.81)
RDW: 17.3 % — ABNORMAL HIGH (ref 11.5–15.5)
WBC: 5.9 10*3/uL (ref 4.0–10.5)

## 2013-11-27 LAB — TROPONIN I

## 2013-11-27 MED ORDER — AMLODIPINE BESYLATE 5 MG PO TABS
5.0000 mg | ORAL_TABLET | Freq: Every day | ORAL | Status: DC
  Administered 2013-11-27: 5 mg via ORAL
  Filled 2013-11-27 (×2): qty 1

## 2013-11-27 MED ORDER — ENSURE COMPLETE PO LIQD
237.0000 mL | Freq: Two times a day (BID) | ORAL | Status: DC
  Administered 2013-11-27 – 2013-12-01 (×8): 237 mL via ORAL

## 2013-11-27 MED ORDER — FUROSEMIDE 10 MG/ML IJ SOLN
80.0000 mg | Freq: Two times a day (BID) | INTRAMUSCULAR | Status: DC
  Administered 2013-11-27 – 2013-11-30 (×7): 80 mg via INTRAVENOUS
  Filled 2013-11-27 (×12): qty 8

## 2013-11-27 MED ORDER — ATORVASTATIN CALCIUM 20 MG PO TABS
20.0000 mg | ORAL_TABLET | Freq: Every day | ORAL | Status: DC
  Administered 2013-11-27 – 2013-11-30 (×4): 20 mg via ORAL
  Filled 2013-11-27 (×5): qty 1

## 2013-11-27 MED ORDER — IPRATROPIUM-ALBUTEROL 0.5-2.5 (3) MG/3ML IN SOLN
3.0000 mL | Freq: Three times a day (TID) | RESPIRATORY_TRACT | Status: DC
  Administered 2013-11-27 (×3): 3 mL via RESPIRATORY_TRACT
  Filled 2013-11-27 (×3): qty 3

## 2013-11-27 MED ORDER — ISOSORBIDE MONONITRATE ER 30 MG PO TB24
30.0000 mg | ORAL_TABLET | Freq: Every day | ORAL | Status: DC
  Administered 2013-11-27: 30 mg via ORAL
  Filled 2013-11-27 (×2): qty 1

## 2013-11-27 MED ORDER — CLONIDINE HCL 0.3 MG PO TABS
0.3000 mg | ORAL_TABLET | Freq: Two times a day (BID) | ORAL | Status: DC
  Administered 2013-11-27: 0.3 mg via ORAL
  Filled 2013-11-27 (×2): qty 1

## 2013-11-27 MED ORDER — CLONIDINE HCL 0.2 MG PO TABS
0.2000 mg | ORAL_TABLET | Freq: Two times a day (BID) | ORAL | Status: DC
  Administered 2013-11-27 – 2013-12-01 (×8): 0.2 mg via ORAL
  Filled 2013-11-27 (×10): qty 1

## 2013-11-27 MED ORDER — HYDRALAZINE HCL 25 MG PO TABS
25.0000 mg | ORAL_TABLET | Freq: Three times a day (TID) | ORAL | Status: DC
  Administered 2013-11-27 (×2): 25 mg via ORAL
  Filled 2013-11-27 (×7): qty 1

## 2013-11-27 NOTE — H&P (Signed)
I have seen the patient and reviewed the H&P by Dahlia Bailiff MS IV and discussed the care of the patient with them. See my full H&P for my additions.   Signed:  Jessee Avers, MD PGY-3 Internal Medicine Teaching Service Pager: [phone] 11/27/2013, 3:25 PM

## 2013-11-27 NOTE — Progress Notes (Signed)
LOS: 1 day   Subjective: NAEON. States that chest pain was initially better o/n but then worse this AM, still sharp and localized to left midaxillary line, not worse with position. Received 2mg  morphine this AM along with increasing his nitro gtt with no change in his pain. States that this was the same type of pain that during last admission 1 month ago. Repeat EKG again this morning was unchanged. Cycled troponins were all negative.  Breathing comfortably while sitting up but states he gets short of breath laying flat.   Objective: BP 169/98  Pulse 79  Temp(Src) 97.9 F (36.6 C) (Oral)  Resp 21  Ht 5\' 5"  (1.651 m)  Wt 78.881 kg (173 lb 14.4 oz)  BMI 28.94 kg/m2  SpO2 99%  Intake/Output Summary (Last 24 hours) at 11/27/13 1134 Last data filed at 11/27/13 1100  Gross per 24 hour  Intake 1208.7 ml  Output   1900 ml  Net -691.3 ml    Physical Exam: GEN: sitting up in bed in no acute distress, awake and alert, cooperative EYES: mild scleral icterus, EOMI CV: RRR, normal S1, split s2; no rubs, exam unchanged in leaned forward position PULM: mildly increased work of breathing, diffusely decreased aeration, bibasialar predominant crackles with diffuse intermittent wheezes and coughing  ABD: soft, NT/ND, +BS EXT: 1+ bilateral LE edema  Labs/Studies: I have reviewed labs and studies from last 24hrs per EMR.  Medications: I have reviewed the patient's current medications.  Assessment/Plan: Principal Problem:   Acute on chronic diastolic heart failure Active Problems:   Iron deficiency anemia   CAD (coronary artery disease)   Chronic kidney disease (CKD), stage III (moderate)   Polysubstance abuse   Unstable angina   Chest pain   Gastric AVM   Malignant hypertension   History of GI bleed  Charles [name] is a 68 year old man with a history of HFpEF, CAD s/p 3VCABG 2009, anemia with h/o GI bleed, and cocaine abuse who presents with chest pain likely 2/2 to acutely  decompensated HFpEF.  Chest pain: Most likely etiology MSK in nature. O/n troponins negative and no changes in AM EKG. Given pluritic nature, ddx include PE and pericarditis. PE unlikely- low Wells, does not have increased pain with deep inspiration, has had adequate Spo2 on RA. Acute pericarditis concerning given sharp nature of pain and h/o CABG, however, does not report changes in pain with position and no rub on exam.  - ECHO today to re-evaluate EF and look for signs of pericardial effusion, which would increase our suspicion for acute pericarditis - wean off nitro and start Imdur 30 - per cardiology, will consider left heart cath if EF is lower that prior  Acutely decompensated HFpEF: Diuresed well with 80 IV lasix last night. Down 1.7 L from admission. Still volume overloaded on exam. Will start lasix 80 IV BID. Dry weight upon discharge last month was 78, currently at 78.8. - diurese with lasix 80 IV BID - daily BMP, replete lytes prn - daily weights; presumed dry wt of 78 kg - strict I/O   Uncontrolled HTN: Has been on nitro gtt and holding antihypertensives overnight with no change in chest pain. Will wean off nitro today and restart home antihypertensives and start hydralazine and imdur - stop nitro gtt - Amlodipine 5 mg daily - clonidine 0.3 BID - hydralazine 25 mg q8h - imdur 30 daily  CAD s/p 3VCABG 2009: Denies anginal chest pain. Troponin negative and no EKG changes.  -  continue ASA 81, has h/o chronic indolent GI bleed but H/H reassuringly stable - simvastatin 40 daily  Normocytic Anemia: Stable. 2/2 indolent GI bleed.  - pantoprazole 40 daily for GI ppx and h/o bleeding proximal bowel AVM  - daily CBC  - type and screen   Dispo: Floor status.   This is a Careers information officer Note.  The care of the patient was discussed with Dr. Dareen Piano and the assessment and plan formulated with their assistance.  Please see their attached note for official documentation of the daily  encounter.

## 2013-11-27 NOTE — Progress Notes (Signed)
CARDIAC REHAB PHASE I   PRE:  Rate/Rhythm: 71 SR  BP:  Supine:   Sitting: 120/67  Standing:    SaO2: 93%RA  MODE:  Ambulation: 350 ft   POST:  Rate/Rhythm: 76 SR  BP:  Supine:   Sitting: 137/68  Standing:    SaO2: 98%RA 1440-1520 Pt grimacing with CP prior to walk and received morphine. A little dizzy upon standing but cleared after standing a few minutes. Pt walked 350 ft with rolling walker on RA talking and joking with staff. Tolerated well. Did c/o hip pain and was surprised he could walk that far, but no increase in CP. To recliner after walk. Call light in reach. Pt very talkative and pleasant.   Graylon Good, RN BSN  11/27/2013 3:16 PM

## 2013-11-27 NOTE — Progress Notes (Signed)
Echocardiogram 2D Echocardiogram has been performed.  Charles [name] 11/27/2013, 4:32 PM

## 2013-11-27 NOTE — Progress Notes (Signed)
Pt seen and examined with Dr. Ronnald Ramp. Please refer to resident note for details  Pt still c/o sharp, left sided CP. No relation to exertion  Exam:  Gen: AAO*3, NAD  Cardio- RRR, normal heart sounds  Lungs- faint bibasilar crackles, scattered wheezes +  Abd- soft, Non tender, BS +  Ext- b/l trace LE edema   Assessment and Plan:  Pt is a 68 y/o male with CP likely secondary to cocaine abuse, accelerated HTN, acute on chronic diastolic HF   CP/angina:   - Cardio following  - Nitro gtt dc'd. C/w home meds - BP now improved. Will monitor - repeat EKG with no acute ST/T wave changes - Avoid beta blockers secondary to cocaine abuse  - Would consider dc'ing morphine in AM and starting PO meds if needed - Pt likely a poor candidate for stenting given h/o non compliance   Acute on chronic diastolic HF:  - Would c/w lasix 80 mg IV bid for diuresis  - Strict I's and O's, daily weights  - PO fluid restriction 1.5 litres   Chronic normocytic anemia:  - Hg at baseline. Will monitor  - Outpatient GI follow up  Cocaine abuse:  - Pt advised to quit cocaine

## 2013-11-27 NOTE — Progress Notes (Signed)
Advanced Heart Failure Rounding Note Referring Physician: Claudine Mouton  Primary Physician: [name]  Primary Cardiologist: Hilty/Bensimohn/Hochrein initially in 2010  Reason for Consultation: Chest Pain      Subjective:   Charles [name] is a 68 y.o. year old male with prior hx/o cocaine abuse, GI bleed, systolic CHF, CAD s/p CABG 2009 not on anticoagulants, and stage 3 CKD. Admits to smoking cocaine on Sunday. Last echo 4/15 EF 50-55% Estimated PA pressure 51 mmHg.  Yesterday he was admitted with chest pain and dyspnea.  BP elevated he was started on Nitro drip. Given 40 mg IV lasix and admitted to SDU.   Denies CP. Mild dyspnea with exertion.     Objective:   Weight Range:  Vital Signs:   Temp:  [97.5 F (36.4 C)-98.8 F (37.1 C)] 97.9 F (36.6 C) (09/10 0729) Pulse Rate:  [74-92] 79 (09/10 0729) Resp:  [15-32] 21 (09/10 0729) BP: (141-189)/(80-125) 172/97 mmHg (09/10 0729) SpO2:  [92 %-100 %] 97 % (09/10 0729) Weight:  [172 lb 9.9 oz (78.3 kg)] 172 lb 9.9 oz (78.3 kg) (09/09 1530) Last BM Date: 11/25/13  Weight change: Filed Weights   11/26/13 1530  Weight: 172 lb 9.9 oz (78.3 kg)    Intake/Output:   Intake/Output Summary (Last 24 hours) at 11/27/13 0855 Last data filed at 11/27/13 0829  Gross per 24 hour  Intake 749.32 ml  Output   1725 ml  Net -975.68 ml     Physical Exam: General:  Well appearing. No resp difficulty. Lying in bed.  HEENT: normal Neck: supple. JVP to jaw.  . Carotids 2+ bilat; no bruits. No lymphadenopathy or thryomegaly appreciated. Cor: PMI nondisplaced. Regular rate & rhythm. No rubs, gallops or murmurs. Lungs: clear Abdomen: soft, nontender, nondistended. No hepatosplenomegaly. No bruits or masses. Good bowel sounds. Extremities: no cyanosis, clubbing, rash, edema Neuro: alert & orientedx3, cranial nerves grossly intact. moves all 4 extremities w/o difficulty. Affect pleasant  Telemetry: SR 90s  Labs: Basic Metabolic Panel:  Recent  Labs Lab 11/26/13 0430 11/27/13 0401  NA 135* 135*  K 4.3 4.5  CL 104 103  CO2 16* 19  GLUCOSE 96 100*  BUN 20 24*  CREATININE 1.02 1.17  CALCIUM 8.1* 8.3*    Liver Function Tests: No results found for this basename: AST, ALT, ALKPHOS, BILITOT, PROT, ALBUMIN,  in the last 168 hours No results found for this basename: LIPASE, AMYLASE,  in the last 168 hours No results found for this basename: AMMONIA,  in the last 168 hours  CBC:  Recent Labs Lab 11/26/13 0430 11/27/13 0401  WBC 5.1 5.9  NEUTROABS 3.0  --   HGB 8.0* 8.2*  HCT 25.7* 27.3*  MCV 80.6 79.8  PLT 247 268    Cardiac Enzymes:  Recent Labs Lab 11/26/13 1600 11/26/13 2138 11/27/13 0400  TROPONINI <0.30 <0.30 <0.30    BNP: BNP (last 3 results)  Recent Labs  10/15/13 0654 10/23/13 1057 11/26/13 0430  PROBNP 1973.0* 2904.0* 5616.0*     Other results:  EKG:   Imaging: Dg Chest Portable 1 View  11/26/2013   CLINICAL DATA:  Chest pain  EXAM: PORTABLE CHEST - 1 VIEW  COMPARISON:  10/23/2013  FINDINGS: Postoperative changes in the mediastinum. Shallow inspiration. Heart size is enlarged and there is pulmonary vascular congestion centrally. Suggestion of mild interstitial edema in the lung bases. No focal consolidation. No pneumothorax. No blunting of costophrenic angles.  IMPRESSION: Cardiac enlargement with central pulmonary vascular congestion and mild interstitial  edema.   Electronically Signed   By: Lucienne Capers M.D.   On: 11/26/2013 04:28      Medications:     Scheduled Medications: . aspirin EC  81 mg Oral Daily  . citalopram  20 mg Oral Daily  . gabapentin  100 mg Oral BID  . ipratropium-albuterol  3 mL Nebulization TID  . pantoprazole  40 mg Oral BID  . phenytoin  200 mg Oral q morning - 10a  . phenytoin  300 mg Oral QHS  . simvastatin  40 mg Oral QPM     Infusions: . nitroGLYCERIN 15 mcg/min (11/27/13 0809)     PRN Medications:  morphine injection,  nitroGLYCERIN   Assessment/Plan   [name] is 70 year with h/o CAD CABG, HTN, cocaine abuse, and diastolic heart failure admitted with chest pain.  1. CAD: h/o CABG. Cardiolite in 12/14 with scar, no ischemia. He is having on and off chest pain. Troponin negative x 3. No BB given recent cocaine use. On simvastatin.  I think he can be on ASA 81 daily.  He is anemic but this has been relatively stable over time.   2. Acute on chronic diastolic CHF: Last ECHO 03/8561. Check ECHO today.  Volume status remains elevated. Increase lasix to 80 mg IV twice a day. Renal function ok. Consult cardiac rehab.  3. CKD: stable.  4. Uncontrolled HTN- Continue Nitro drip at 15 mcg. Add hydralazine 25 mg tid/30 mg imdur daily. Hopefully can wean nitro later today once SBP <140.  5. Cocaine Abuse- last uses on Sunday. UDS + cocaine  Stressed he needs to quit.   Length of Stay: 1   CLEGG,AMY NP-C  11/27/2013, 8:55 AM  Advanced Heart Failure Team Pager [phone] (M-F; [place])  Please contact [facility] for night-coverage after hours (4p -7a ) and weekends on [email or web address]  Patient seen with NP, agree with the above note.  1. CAD: s/p CABG.  He is having on and off chest pain.  Troponin negative x 3.  Possible that this is related to cocaine use and uncontrolled HTN.  Would hold off on cath for now.   - Can restart ASA 81 mg daily (anemia has been stable).  - Continue statin, add Imdur 30 mg daily when he is titrated off NTG gtt.  - Will follow, consider cath if EF lower on echo.  2. Acute on chronic presumed diastolic CHF: EF 37-85% with wall motion abnormalities on 4/15 echo.  He is volume overloaded on exam today with JVP 14-16 cm.  It also sounds like he has an S3. - Repeat echo to see if EF is lower.  - Lasix 80 mg IV bid.  - Control blood pressure.  3. HTN: Hypertensive emergency with chest pain and pulmonary edema.  He says that he was taking his meds at home.  He was on clonidine and amlodipine  at home, which I will restart.  Titrate off NTG gtt.  Agree with hydralazine.  Would avoid beta blocker with active cocaine abuse.  4. CKD: stable, follow with diuresis.  5. Anemia: Stable, chronic.  Has Fe deficiency anemia with h/o AVMs.  Further workup per primary service.   Loralie Champagne 11/27/2013 9:41 AM

## 2013-11-27 NOTE — Progress Notes (Signed)
INITIAL NUTRITION ASSESSMENT  DOCUMENTATION CODES Per approved criteria  -Not Applicable   INTERVENTION: Ensure Complete po BID, each supplement provides 350 kcal and 13 grams of protein RD to follow for nutrition care plan  NUTRITION DIAGNOSIS: Inadequate oral intake related to decreased appetite as evidenced by patient report  Goal: Pt to meet >/= 90% of their estimated nutrition needs   Monitor:  PO & supplemental intake, weight, labs, I/O's  Reason for Assessment: Malnutrition Screening Tool Report  68 y.o. male  Admitting Dx: Acute on chronic diastolic heart failure  ASSESSMENT: 68 y/o Male with PMH of CAD s/p CABG, chronic diastolic HF,chronic anemia, cocaine abuse, HTN, seizure d/o, CKD stage 3 who presented with chest pain.  Patient reports a decreased appetite; no % PO intake records available; no recent weight loss reported; would like Ensure Complete supplements during hospitalization; RD to order.  No muscle or subcutaneous fat depletion noticed.  Height: Ht Readings from Last 1 Encounters:  11/26/13 5\' 5"  (1.651 m)    Weight: Wt Readings from Last 1 Encounters:  11/27/13 173 lb 14.4 oz (78.881 kg)    Ideal Body Weight: 136 lb  % Ideal Body Weight: 127%  Wt Readings from Last 10 Encounters:  11/27/13 173 lb 14.4 oz (78.881 kg)  10/23/13 172 lb (78.019 kg)  10/18/13 172 lb 2.9 oz (78.1 kg)  08/25/13 174 lb (78.926 kg)  08/19/13 168 lb 9.7 oz (76.48 kg)  08/03/13 168 lb 14.4 oz (76.613 kg)  08/03/13 168 lb 14.4 oz (76.613 kg)  07/21/13 162 lb 11.2 oz (73.8 kg)  03/19/13 176 lb 9.6 oz (80.105 kg)  03/13/13 185 lb 3 oz (84 kg)    Usual Body Weight: 168 lb  % Usual Body Weight: 103%  BMI:  Body mass index is 28.94 kg/(m^2).  Estimated Nutritional Needs: Kcal: 1900-2100 Protein: 95-105 gm Fluid: 1.9-2.1 L  Skin: Intact  Diet Order: Cardiac  EDUCATION NEEDS: -No education needs identified at this time   Intake/Output Summary (Last 24  hours) at 11/27/13 1449 Last data filed at 11/27/13 1300  Gross per 24 hour  Intake 1582.2 ml  Output   1900 ml  Net -317.8 ml   Labs:   Recent Labs Lab 11/26/13 0430 11/27/13 0401  NA 135* 135*  K 4.3 4.5  CL 104 103  CO2 16* 19  BUN 20 24*  CREATININE 1.02 1.17  CALCIUM 8.1* 8.3*  GLUCOSE 96 100*    Scheduled Meds: . amLODipine  5 mg Oral Daily  . aspirin EC  81 mg Oral Daily  . citalopram  20 mg Oral Daily  . cloNIDine  0.2 mg Oral BID  . furosemide  80 mg Intravenous BID  . gabapentin  100 mg Oral BID  . hydrALAZINE  25 mg Oral 3 times per day  . ipratropium-albuterol  3 mL Nebulization TID  . isosorbide mononitrate  30 mg Oral Daily  . pantoprazole  40 mg Oral BID  . phenytoin  200 mg Oral q morning - 10a  . phenytoin  300 mg Oral QHS  . simvastatin  40 mg Oral QPM    Continuous Infusions:   Past Medical History  Diagnosis Date  . Iron deficiency anemia   . Chronic diastolic heart failure     a. 2D ECHO 2015 50-55% and G2DD  . Hyperlipidemia LDL goal <70   . Obstructive sleep apnea     a. not on home CPAP  . Ischemic cardiomyopathy  a. EF now normalized (2012 EF 35-40%)  . Homelessness   . 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 Polysubstance abuse     a. cocaine, THC  . AV malformation of gastrointestinal tract   . Family history of early CAD   . DDD (degenerative disc disease), lumbar   . Peripheral vascular disease   . Seizure disorder   . Seizures   . Hypertension   . CAD (coronary artery disease)     a. s/p 3-vessel CABG (12/2007) // 100% RCA stenosis with collaterals from left system. Severe bifurcation lesions of proxima CXA and OM. Moderate LAD diseaseFollowed by Dr. Wyline Copas in [facility]  . Diabetes   . Chronic kidney disease (CKD), stage III (moderate)     BL SCr 1.5-1.6  . Renal artery stenosis     Past Surgical History  Procedure Laterality Date  . Apc  03/2010     To treat small bowel AVMs  . Esophagogastroduodenoscopy N/A 08/14/2012    Procedure: ESOPHAGOGASTRODUODENOSCOPY (EGD);  Surgeon: [name], MD;  Location: [facility] ENDOSCOPY;  Service: Endoscopy;  Laterality: N/A;  . Hot hemostasis N/A 08/14/2012    Procedure: HOT HEMOSTASIS (ARGON PLASMA COAGULATION/BICAP);  Surgeon: [name], MD;  Location: [facility] ENDOSCOPY;  Service: Endoscopy;  Laterality: N/A;  . Esophagogastroduodenoscopy (egd) with propofol N/A 08/04/2013    Procedure: ESOPHAGOGASTRODUODENOSCOPY (EGD) WITH PROPOFOL;  Surgeon: [name], MD;  Location: [facility] ENDOSCOPY;  Service: Endoscopy;  Laterality: N/A;  . Coronary artery bypass graft  12/2007    Arthur Holms, RD, LDN Pager #: [phone] After-Hours Pager #: [phone]

## 2013-11-27 NOTE — Progress Notes (Signed)
  I have seen and examined the patient myself, and I have reviewed the note by Dahlia Bailiff, MS 4 and was present during the interview and physical exam.  Please see my separate H&P for additional findings, assessment, and plan.   Signed: Corky Sox, MD 11/27/2013, 12:21 PM

## 2013-11-27 NOTE — Progress Notes (Signed)
TRANSITIONAL CARE CLINIC NOTE  Patient evaluated for Transitional Care Management services with TCC Program as a benefit of patient's [employer]. Patient admitted with CP Patient has had 6 hospitalization in the past 6 months for similar issues.  PMH of  CHF EF 50-55, CAD s/p 3VCABG 2009, CKF stage III anemia with h/o GI bleed, and cocaine abuse. Discussed TCC Care Management services with patient. Patient states, he lives in [place] and his PCP is in [place]. that he does not want to interrupt seeing   his PCP Dr. Hal Neer   he is working with him on arranging his sleep study for Sleep Apnea. Explained that we would contact Dr. Alroy Dust and coordinate a hand off once goals are met and he is ready for discharged from [facility], Patient also reminded TCC Coordinator that he also has to a cardiologist and nephrologist. Dr. Lillia Dallas cardiologist. Discussed with patient  we assist with coordination of care and transportation. Patient reports his PCP can see him within 7 days after discharge.  Explained that [facility] services does not replace or interfere with any services that are arranged by inpatient case management or social work. Patient verbalized it sounds good but it may not work for him at this time. Will follow up with Unit CM.

## 2013-11-27 NOTE — Progress Notes (Signed)
Utilization review completed. Eddye Broxterman, RN, BSN. 

## 2013-11-27 NOTE — Progress Notes (Signed)
Subjective: Seen at bedside this AM. Still reporting some chest pain, sharp in nature, left/central in location, not worse w/ deep inspiration or coughing. Mild SOB. No dizziness, lightheadedness, or palpitations.   Objective: Vital signs in last 24 hours: Filed Vitals:   11/27/13 0952 11/27/13 1038 11/27/13 1100 11/27/13 1215  BP:  169/98 160/94 102/59  Pulse:   78 55  Temp:    97.5 F (36.4 C)  TempSrc:      Resp:   24 16  Height:      Weight:      SpO2: 99%  96% 94%   Weight change:   Intake/Output Summary (Last 24 hours) at 11/27/13 1229 Last data filed at 11/27/13 1100  Gross per 24 hour  Intake 1208.7 ml  Output   1900 ml  Net -691.3 ml   Physical Exam: General: Alert, cooperative, NAD.  HEENT: PERRL, EOMI. Moist mucus membranes Neck: Full range of motion without pain, supple, no lymphadenopathy or carotid bruits Lungs: Mild bibasilar crackles and scattered wheezes. Air entry equal bilaterally. Heart: RRR, no murmurs. S3? Abdomen: Soft, non-tender, non-distended, BS + Extremities: No cyanosis or clubbing. Trace pitting edema. Neurologic: Alert & oriented X3, cranial nerves II-XII intact, strength grossly intact, sensation intact to light touch   Lab Results: Basic Metabolic Panel:  Recent Labs Lab 11/26/13 0430 11/27/13 0401  NA 135* 135*  K 4.3 4.5  CL 104 103  CO2 16* 19  GLUCOSE 96 100*  BUN 20 24*  CREATININE 1.02 1.17  CALCIUM 8.1* 8.3*   CBC:  Recent Labs Lab 11/26/13 0430 11/27/13 0401  WBC 5.1 5.9  NEUTROABS 3.0  --   HGB 8.0* 8.2*  HCT 25.7* 27.3*  MCV 80.6 79.8  PLT 247 268   Cardiac Enzymes:  Recent Labs Lab 11/26/13 1600 11/26/13 2138 11/27/13 0400  TROPONINI <0.30 <0.30 <0.30   BNP:  Recent Labs Lab 11/26/13 0430  PROBNP 5616.0*   Urine Drug Screen: Drugs of Abuse     Component Value Date/Time   LABOPIA NONE DETECTED 11/26/2013 1023   COCAINSCRNUR POSITIVE* 11/26/2013 1023   LABBENZ NONE DETECTED 11/26/2013 1023     AMPHETMU NONE DETECTED 11/26/2013 1023   THCU NONE DETECTED 11/26/2013 1023   LABBARB NONE DETECTED 11/26/2013 1023     Micro Results: Recent Results (from the past 240 hour(s))  MRSA PCR SCREENING     Status: None   Collection Time    11/26/13  3:51 PM      Result Value Ref Range Status   MRSA by PCR NEGATIVE  NEGATIVE Final   Comment:            The GeneXpert MRSA Assay (FDA     approved for NASAL specimens     only), is one component of a     comprehensive MRSA colonization     surveillance program. It is not     intended to diagnose MRSA     infection nor to guide or     monitor treatment for     MRSA infections.   Studies/Results: Dg Chest Portable 1 View  11/26/2013   CLINICAL DATA:  Chest pain  EXAM: PORTABLE CHEST - 1 VIEW  COMPARISON:  10/23/2013  FINDINGS: Postoperative changes in the mediastinum. Shallow inspiration. Heart size is enlarged and there is pulmonary vascular congestion centrally. Suggestion of mild interstitial edema in the lung bases. No focal consolidation. No pneumothorax. No blunting of costophrenic angles.  IMPRESSION: Cardiac enlargement with central  pulmonary vascular congestion and mild interstitial edema.   Electronically Signed   By: Lucienne Capers M.D.   On: 11/26/2013 04:28   Medications: I have reviewed the patient's current medications. Scheduled Meds: . amLODipine  5 mg Oral Daily  . aspirin EC  81 mg Oral Daily  . citalopram  20 mg Oral Daily  . cloNIDine  0.3 mg Oral BID  . furosemide  80 mg Intravenous BID  . gabapentin  100 mg Oral BID  . hydrALAZINE  25 mg Oral 3 times per day  . ipratropium-albuterol  3 mL Nebulization TID  . isosorbide mononitrate  30 mg Oral Daily  . pantoprazole  40 mg Oral BID  . phenytoin  200 mg Oral q morning - 10a  . phenytoin  300 mg Oral QHS  . simvastatin  40 mg Oral QPM   Continuous Infusions: . nitroGLYCERIN Stopped (11/27/13 1215)   PRN Meds:.morphine injection,  nitroGLYCERIN  Assessment/Plan: 68 y.o. male w/ PMHx of HTN, HLD, dCHF, PAH, PVD, CAD s/p CABG (2009),  and CKD, admitted for chest pain.  Chest Pain- Patient w/ known CAD s/p CABG (2009 @ [place]; LIMA to LAD + saphenous vein to PDA and 1st/2nd OM) and recent cocaine abuse, admitted for severe, left-sided sharp chest pain. Etiology may be associated w/ volume overload, cocaine use, and uncontrolled HTN. May also be related to GERD symptoms. Started on Nitro gtt on admission, still having pain. Slowly titrating off. Seen by HF team this AM, added Imdur 30 mg qd and restarted ASA. Troponins negative, EKG w/ no significant changes.  -ASA -Imdur 30 qd -Continue Zocor 40 mg qhs -Morphine 2 mg q2h prn -ECHO pending  Acute on Chronic dCHF-  Most recent ECHO from 07/16/13 shows EF of 50-55% w/ possible mild basal/midinferolateral hypokinesis and grade 2 diastolic dysfunction. Takes Lasix 80 mg qd at home. Given 80 mg IV in ED and started on 80 mg IV bid for now. Net -1.6L since admission. Still w/ crackles on exam.  -ECHO as above -Continue Lasix -Imdur as above + Hydralazine 25 mg q8h for decreased afterload.  -Daily weights, I/O's  HTN- Likely non-compliant w/ home BP meds, presented w/ hypertensive urgency. Titrating off Nitro gtt.  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 Patient likely has occult GI losses. Hb 8.2 this AM, near baseline. -Repeat CBC in AM -Will need outpatient GI follow up -Transfuse Hb <7   Dispo: Disposition is deferred at this time, awaiting improvement of current medical problems.  Anticipated discharge in approximately 2-3 day(s).   The patient does have a current PCP [name]) and does not need an [facility] hospital follow-up appointment after discharge.  The patient does not have transportation limitations that  hinder transportation to clinic appointments.  .Services Needed at time of discharge: Y = Yes, Blank = No PT:   OT:   RN:   Equipment:   Other:     LOS: 1 day   Corky Sox, MD 11/27/2013, 12:29 PM

## 2013-11-28 DIAGNOSIS — I509 Heart failure, unspecified: Secondary | ICD-10-CM

## 2013-11-28 DIAGNOSIS — I5023 Acute on chronic systolic (congestive) heart failure: Secondary | ICD-10-CM

## 2013-11-28 DIAGNOSIS — R079 Chest pain, unspecified: Secondary | ICD-10-CM

## 2013-11-28 DIAGNOSIS — D649 Anemia, unspecified: Secondary | ICD-10-CM

## 2013-11-28 DIAGNOSIS — I129 Hypertensive chronic kidney disease with stage 1 through stage 4 chronic kidney disease, or unspecified chronic kidney disease: Secondary | ICD-10-CM

## 2013-11-28 DIAGNOSIS — I25810 Atherosclerosis of coronary artery bypass graft(s) without angina pectoris: Secondary | ICD-10-CM

## 2013-11-28 DIAGNOSIS — I10 Essential (primary) hypertension: Secondary | ICD-10-CM

## 2013-11-28 DIAGNOSIS — F1410 Cocaine abuse, uncomplicated: Secondary | ICD-10-CM

## 2013-11-28 LAB — BASIC METABOLIC PANEL
Anion gap: 16 — ABNORMAL HIGH (ref 5–15)
BUN: 26 mg/dL — ABNORMAL HIGH (ref 6–23)
CALCIUM: 8.3 mg/dL — AB (ref 8.4–10.5)
CO2: 22 mEq/L (ref 19–32)
Chloride: 99 mEq/L (ref 96–112)
Creatinine, Ser: 1.35 mg/dL (ref 0.50–1.35)
GFR, EST AFRICAN AMERICAN: 60 mL/min — AB (ref >90)
GFR, EST NON AFRICAN AMERICAN: 52 mL/min — AB (ref >90)
Glucose, Bld: 116 mg/dL — ABNORMAL HIGH (ref 70–99)
Potassium: 4.1 mEq/L (ref 3.7–5.3)
SODIUM: 137 meq/L (ref 137–147)

## 2013-11-28 LAB — CBC
HCT: 26.7 % — ABNORMAL LOW (ref 39.0–52.0)
Hemoglobin: 8.1 g/dL — ABNORMAL LOW (ref 13.0–17.0)
MCH: 24 pg — ABNORMAL LOW (ref 26.0–34.0)
MCHC: 30.3 g/dL (ref 30.0–36.0)
MCV: 79.2 fL (ref 78.0–100.0)
PLATELETS: 265 10*3/uL (ref 150–400)
RBC: 3.37 MIL/uL — AB (ref 4.22–5.81)
RDW: 17.3 % — AB (ref 11.5–15.5)
WBC: 5.5 10*3/uL (ref 4.0–10.5)

## 2013-11-28 MED ORDER — METOLAZONE 2.5 MG PO TABS
2.5000 mg | ORAL_TABLET | Freq: Once | ORAL | Status: AC
  Administered 2013-11-28: 2.5 mg via ORAL
  Filled 2013-11-28: qty 1

## 2013-11-28 MED ORDER — POTASSIUM CHLORIDE CRYS ER 20 MEQ PO TBCR
20.0000 meq | EXTENDED_RELEASE_TABLET | Freq: Every day | ORAL | Status: DC
  Administered 2013-11-28 – 2013-12-01 (×4): 20 meq via ORAL
  Filled 2013-11-28 (×4): qty 1

## 2013-11-28 MED ORDER — ACETAMINOPHEN 325 MG PO TABS: 650.0000 mg | ORAL_TABLET | Freq: Four times a day (QID) | ORAL | Status: DC | PRN

## 2013-11-28 MED ORDER — IPRATROPIUM-ALBUTEROL 0.5-2.5 (3) MG/3ML IN SOLN: 3.0000 mL | Freq: Four times a day (QID) | RESPIRATORY_TRACT | Status: DC | PRN

## 2013-11-28 MED ORDER — ISOSORBIDE MONONITRATE ER 60 MG PO TB24
60.0000 mg | ORAL_TABLET | Freq: Every day | ORAL | Status: DC
  Administered 2013-11-28 – 2013-11-30 (×3): 60 mg via ORAL
  Filled 2013-11-28 (×3): qty 1

## 2013-11-28 MED ORDER — AMLODIPINE BESYLATE 10 MG PO TABS
10.0000 mg | ORAL_TABLET | Freq: Every day | ORAL | Status: DC
  Filled 2013-11-28: qty 1

## 2013-11-28 MED ORDER — HYDRALAZINE HCL 50 MG PO TABS
50.0000 mg | ORAL_TABLET | Freq: Three times a day (TID) | ORAL | Status: DC
  Administered 2013-11-28 – 2013-12-01 (×10): 50 mg via ORAL
  Filled 2013-11-28 (×13): qty 1

## 2013-11-28 MED ORDER — AMLODIPINE BESYLATE 5 MG PO TABS
5.0000 mg | ORAL_TABLET | Freq: Every day | ORAL | Status: DC
  Administered 2013-11-28 – 2013-11-29 (×2): 5 mg via ORAL
  Filled 2013-11-28 (×2): qty 1

## 2013-11-28 NOTE — Progress Notes (Signed)
CARDIAC REHAB PHASE I   PRE:  Rate/Rhythm: 660SR  BP:  Supine:   Sitting: 126/63  Standing:    SaO2: 95 RA  MODE:  Ambulation: 460 ft   POST:  Rate/Rhythm: 64  BP:  Supine:   Sitting: 127/64  Standing:    SaO2: 98 RA 1510-1550  Assisted X 1 and used walker to ambulate. Gait steady with walker. Pt c/o of legs cramping and tightening up walking. He took several standing rest stops due to the leg discomfort. I questioned him about his chest pain prior to walk states that it is still hurting, he make no c/o of it walking. Pt to recliner after walk with call light in reach.  Rodney Langton RN 11/28/2013 3:46 PM

## 2013-11-28 NOTE — Progress Notes (Signed)
Report called to 3E. Pt transferred by Nurse tech, all belongings at bedside. Etta Quill, RN 11/28/2013 1:57 PM

## 2013-11-28 NOTE — Progress Notes (Signed)
Chaplain visited Charles [name] after being referred to by his nurse. Chaplain made introductory visit and explored some health issues, home-life situation, and some past anxieties. [name] was very enthusiastic and lively, openly inviting into his story. He was grateful for the visit noting that he has been feeling a little down lately. He noted that the staff have been treating him wonderfully and intends to follow advice and instructions given when discharged. He asked for a follow-up this afternoon. Our visit ended on more positive and lite terms.  Delford Field 11/28/[phone]

## 2013-11-28 NOTE — Progress Notes (Signed)
Pt seen and examined with Dr. Eulas Post. Please refer to resident note for details   Pt still c/o persistent cough with clear sputum production. No CP when I examined him.   Exam:  Gen: AAO*3, NAD  Cardio- RRR, normal heart sounds  Lungs- scattered wheezes +  Abd- soft, Non tender, BS +  Ext- LE edema resolving  Assessment and Plan:  Pt is a 68 y/o male with CP likely secondary to cocaine abuse, accelerated HTN, acute on chronic diastolic HF   CP/angina:  - Cardio following  - C/w clonidine, norvasc. BP now better controlled  - 2 D ECHO noted- EF 40%-45%. Lower than on prior ECHO - Pt for Myocardial perfusion scan in AM. May need cath if ischemia +  - Avoid beta blockers secondary to cocaine abuse  - Morphine dc'd. C/w tylenol for now prn. May need percocet for pain - c/w ASA, statin. Hydralazine increased to 50 mg 3 * day, Imdur increased to 60 mg  Acute on chronic diastolic HF:  - Would c/w lasix 80 mg IV bid for diuresis  - Strict I's and O's, daily weights  - c/w hydralazine, imdur  Chronic normocytic anemia:  - Hg at baseline. Will monitor  - Outpatient GI follow up

## 2013-11-28 NOTE — Progress Notes (Signed)
Subjective: Pt states that he could be doing better, but his chest pain has subsided. Still with dry cough that is occasionally productive of clear sputum.   Objective: Vital signs in last 24 hours: Filed Vitals:   11/28/13 0748 11/28/13 0925 11/28/13 1150 11/28/13 1401  BP: 159/96 161/76 118/64 124/65  Pulse: 65  58 64  Temp: 98.5 F (36.9 C)  98.4 F (36.9 C) 97.7 F (36.5 C)  TempSrc: Oral  Oral Oral  Resp: 16  23 20   Height:      Weight:      SpO2: 98%  97% 97%   Weight change: 1 lb 4.5 oz (0.581 kg)  Intake/Output Summary (Last 24 hours) at 11/28/13 1613 Last data filed at 11/28/13 1300  Gross per 24 hour  Intake    957 ml  Output   1750 ml  Net   -793 ml   Vitals reviewed. General: Sitting in a chair, NAD HEENT: PERRL, EOMI Cardiac: RRR, no rubs, murmurs or gallops Pulm: Mild scattered wheezing Abd: Soft, nontender, nondistended, BS present Ext: warm and well perfused, no pedal edema present Neuro: alert and oriented X3, cranial nerves II-XII grossly intact, moves all 4 extremities w/o difficulty.   Lab Results: Basic Metabolic Panel:  Recent Labs Lab 11/27/13 0401 11/28/13 0311  NA 135* 137  K 4.5 4.1  CL 103 99  CO2 19 22  GLUCOSE 100* 116*  BUN 24* 26*  CREATININE 1.17 1.35  CALCIUM 8.3* 8.3*   CBC:  Recent Labs Lab 11/26/13 0430 11/27/13 0401 11/28/13 0311  WBC 5.1 5.9 5.5  NEUTROABS 3.0  --   --   HGB 8.0* 8.2* 8.1*  HCT 25.7* 27.3* 26.7*  MCV 80.6 79.8 79.2  PLT 247 268 265   Cardiac Enzymes:  Recent Labs Lab 11/26/13 1600 11/26/13 2138 11/27/13 0400  TROPONINI <0.30 <0.30 <0.30   BNP:  Recent Labs Lab 11/26/13 0430  PROBNP 5616.0*   Urine Drug Screen: Drugs of Abuse     Component Value Date/Time   LABOPIA NONE DETECTED 11/26/2013 1023   COCAINSCRNUR POSITIVE* 11/26/2013 1023   LABBENZ NONE DETECTED 11/26/2013 1023   AMPHETMU NONE DETECTED 11/26/2013 1023   THCU NONE DETECTED 11/26/2013 1023   LABBARB NONE DETECTED  11/26/2013 1023      Micro Results: Recent Results (from the past 240 hour(s))  MRSA PCR SCREENING     Status: None   Collection Time    11/26/13  3:51 PM      Result Value Ref Range Status   MRSA by PCR NEGATIVE  NEGATIVE Final   Comment:            The GeneXpert MRSA Assay (FDA     approved for NASAL specimens     only), is one component of a     comprehensive MRSA colonization     surveillance program. It is not     intended to diagnose MRSA     infection nor to guide or     monitor treatment for     MRSA infections.   Studies/Results: No results found. Medications: I have reviewed the patient's current medications. Scheduled Meds: . amLODipine  5 mg Oral Daily  . aspirin EC  81 mg Oral Daily  . atorvastatin  20 mg Oral q1800  . citalopram  20 mg Oral Daily  . cloNIDine  0.2 mg Oral BID  . feeding supplement (ENSURE COMPLETE)  237 mL Oral BID BM  . furosemide  80 mg Intravenous BID  . gabapentin  100 mg Oral BID  . hydrALAZINE  50 mg Oral 3 times per day  . isosorbide mononitrate  60 mg Oral Daily  . pantoprazole  40 mg Oral BID  . phenytoin  200 mg Oral q morning - 10a  . phenytoin  300 mg Oral QHS  . potassium chloride  20 mEq Oral Daily   Continuous Infusions:  PRN Meds:.acetaminophen, ipratropium-albuterol  Assessment/Plan: 68 y.o. male w/ PMHx of HTN, HLD, dCHF, PAH, PVD, CAD s/p CABG (2009), CKD, and cocaine abuse was admitted for chest pain in the setting of uncontrolled HTN, recent cocaine use, and volume overload.  Chest Pain/Angina: Patient w/ known CAD s/p CABG (2009 @ [place]; LIMA to LAD + saphenous vein to PDA and 1st/2nd OM) and recent cocaine abuse, admitted for severe, left-sided sharp chest pain. Etiology likely 2/2 volume overload, cocaine use, and uncontrolled HTN. Started on Nitro gtt on admission which has been discontinued. Seen by HF team who started ASA, Imdur, and Hydralazine. Troponins negative, EKG w/o significant changes. ECHO  with reduced EF to 40-45% w/ wall motion abnormalities which was a change from April with EF 50-55%. Cardiology planning for [facility] tomorrow, and if + pt will need LHC.  - Continue ASA  - Imdur increased to 60mg  qd  - Continue Hydralazine - Continue Zocor 40 mg qhs  - D/c Morphine, start Tylenol 650mg  PRN for pain  - Lexiscan tomorrow  Acute on Chronic dCHF: Most recent ECHO from 07/16/13 shows EF of 50-55% w/ possible mild basal/midinferolateral hypokinesis and grade 2 diastolic dysfunction. On Lasix 80 mg qd at home. Given 80 mg IV in ED and started on 80 mg IV bid for now. Net -1.7L since admission. HF team concerned that the pt is taking in too much liquid.  -Continue Lasix and metolazone per HF team -Imdur as above + Hydralazine 25 mg q8h for decreased afterload.  -Daily weights, strict I/O's   HTN: Likely non-compliant w/ home BP meds, presented w/ hypertensive urgency. Titrated off Nitro gtt.  - Continue Norvasc 5 mg qd + Clonidine 0.2 mg bid  - Imdur and Lasix as above  - Continue Hydralazine 25 mg q8h prn   Normocytic anemia: H/o significant anemia w/ iron deficiency, also w/ AVM's on EGD. Patient likely has occult GI losses. Hb 8.1 this AM, near baseline.  -Repeat CBC in AM  -Will need outpatient GI follow up  -Transfuse Hb <7  PLEASE REFER TO MS-IV NOTE FROM TODAY FOR THE REMAINDER OF THE PLAN    Dispo: Disposition is deferred at this time, awaiting improvement of current medical problems.  Anticipated discharge in approximately 2-3 day(s).   The patient does have a current PCP [name]) and does not need an [facility] hospital follow-up appointment after discharge.  The patient does not have transportation limitations that hinder transportation to clinic appointments.  .Services Needed at time of discharge: Y = Yes, Blank = No PT:   OT:   RN:   Equipment:   Other:     LOS: 2 days   Otho Bellows, MD 11/28/2013, 4:13 PM

## 2013-11-28 NOTE — Progress Notes (Signed)
  I have seen and examined the patient, and reviewed the daily progress note by Dahlia Bailiff, MS IV and discussed the care of the patient with him. Please see my progress note from 11/28/2013 for further details regarding assessment and plan.    Signed:  Otho Bellows, MD 11/28/2013, 4:46 PM

## 2013-11-28 NOTE — Progress Notes (Signed)
Advanced Heart Failure Rounding Note Referring Physician: Claudine Mouton  Primary Physician: [name]  Primary Cardiologist: Hilty/Bensimohn/Hochrein initially in 2010  Reason for Consultation: Chest Pain      Subjective:   Charles [name] is a 68 y.o. year old male with prior hx/o cocaine abuse, GI bleed, systolic CHF, CAD s/p CABG 2009 not on anticoagulants, and stage 3 CKD. Admits to smoking cocaine on Sunday. Last echo 4/15 EF 50-55% Estimated PA pressure 51 mmHg.  Admitted with chest pain and dyspnea.  Yesterday Nitro gtt was stopped and he started on his home clonidine and amlodipine. He continued to diurese on IV lasix.  Multiple cups in room. Says he is eating lots of ice. Weight up 1 pound.  ECHO 11/27/13 EF 40-45% Grade II DD  Denies CP/SOB   Objective:   Weight Range:  Vital Signs:   Temp:  [97.3 F (36.3 C)-98.1 F (36.7 C)] 98.1 F (36.7 C) (09/11 0501) Pulse Rate:  [55-79] 60 (09/11 0501) Resp:  [11-27] 27 (09/11 0501) BP: (102-172)/(59-107) 155/78 mmHg (09/11 0501) SpO2:  [93 %-100 %] 100 % (09/10 2047) Weight:  [173 lb 14.4 oz (78.881 kg)] 173 lb 14.4 oz (78.881 kg) (09/10 0909) Last BM Date: 11/25/13 (per patient report)  Weight change: Filed Weights   11/26/13 1530 11/27/13 0909  Weight: 172 lb 9.9 oz (78.3 kg) 173 lb 14.4 oz (78.881 kg)    Intake/Output:   Intake/Output Summary (Last 24 hours) at 11/28/13 0652 Last data filed at 11/27/13 2046  Gross per 24 hour  Intake 1225.88 ml  Output   1675 ml  Net -449.12 ml     Physical Exam: General:  Well appearing. No resp difficulty. Sitting on the side of the bed.   HEENT: normal Neck: supple. JVP to jaw.  . Carotids 2+ bilat; no bruits. No lymphadenopathy or thryomegaly appreciated. Cor: PMI nondisplaced. Regular rate & rhythm. No rubs, murmurs.  +S3 vs wide split S2. Lungs: clear Abdomen: soft, nontender, nondistended. No hepatosplenomegaly. No bruits or masses. Good bowel sounds. Extremities: no  cyanosis, clubbing, rash, edema Neuro: alert & orientedx3, cranial nerves grossly intact. moves all 4 extremities w/o difficulty. Affect pleasant  Telemetry: SR 90s  Labs: Basic Metabolic Panel:  Recent Labs Lab 11/26/13 0430 11/27/13 0401 11/28/13 0311  NA 135* 135* 137  K 4.3 4.5 4.1  CL 104 103 99  CO2 16* 19 22  GLUCOSE 96 100* 116*  BUN 20 24* 26*  CREATININE 1.02 1.17 1.35  CALCIUM 8.1* 8.3* 8.3*    Liver Function Tests: No results found for this basename: AST, ALT, ALKPHOS, BILITOT, PROT, ALBUMIN,  in the last 168 hours No results found for this basename: LIPASE, AMYLASE,  in the last 168 hours No results found for this basename: AMMONIA,  in the last 168 hours  CBC:  Recent Labs Lab 11/26/13 0430 11/27/13 0401 11/28/13 0311  WBC 5.1 5.9 5.5  NEUTROABS 3.0  --   --   HGB 8.0* 8.2* 8.1*  HCT 25.7* 27.3* 26.7*  MCV 80.6 79.8 79.2  PLT 247 268 265    Cardiac Enzymes:  Recent Labs Lab 11/26/13 1600 11/26/13 2138 11/27/13 0400  TROPONINI <0.30 <0.30 <0.30    BNP: BNP (last 3 results)  Recent Labs  10/15/13 0654 10/23/13 1057 11/26/13 0430  PROBNP 1973.0* 2904.0* 5616.0*     Other results:  EKG:   Imaging: No results found.   Medications:     Scheduled Medications: . amLODipine  5 mg Oral  Daily  . aspirin EC  81 mg Oral Daily  . atorvastatin  20 mg Oral q1800  . citalopram  20 mg Oral Daily  . cloNIDine  0.2 mg Oral BID  . feeding supplement (ENSURE COMPLETE)  237 mL Oral BID BM  . furosemide  80 mg Intravenous BID  . gabapentin  100 mg Oral BID  . hydrALAZINE  25 mg Oral 3 times per day  . ipratropium-albuterol  3 mL Nebulization TID  . isosorbide mononitrate  30 mg Oral Daily  . pantoprazole  40 mg Oral BID  . phenytoin  200 mg Oral q morning - 10a  . phenytoin  300 mg Oral QHS    Infusions:    PRN Medications: morphine injection   Assessment/Plan   Charles [name] is 50 year with h/o CAD CABG, HTN, cocaine abuse, and  diastolic heart failure admitted with chest pain.  1. CAD: h/o CABG. Cardiolite in 12/14 with scar, no ischemia. He is having on and off chest pain. Troponin negative x 3. No BB given recent cocaine use. On simvastatin.  Continue ASA 81 daily.  He is anemic but this has been relatively stable over time.   2. Acute on chronic diastolic CHF: Last ECHO 0/2585 50-55%  -->ECHO 11/27/13  EF 40-45%. Grade II DD Volume status still elevated but drinking lots of fluid. Continue lasix to 80 mg IV twice a day and 2.5 mg metolazone. Also start 20 meq of potassium.  Renal function ok. Needs strict I/Os Consult dietary for HF diet. Cardiac Rehab following.  WIll scheduel f/u in HF clinic next week.  3. CKD: stable. Creatinine up a little. 1.1>1.3 Follow closely.  4. Uncontrolled HTN- Slowly improving. Increase hydralazine/Imdur. Continue clonidine 0.2 mg twice a day. 5. Cocaine Abuse- last uses on Sunday. UDS + cocaine  Stressed he needs to quit.   Length of Stay: 2   CLEGG,AMY NP-C  11/28/2013, 6:52 AM  Advanced Heart Failure Team Pager [phone] (M-F; [place])  Please contact [facility] for night-coverage after hours (4p -7a ) and weekends on [email or web address]  Patient seen with NP, agree with the above note.  1. CAD: s/p CABG. He is having on and off chest pain. Troponin negative x 3. Possible that this is related to cocaine use and uncontrolled HTN. However, EF is lower on yesterday's echo.  Wall motion abnormality pattern seems to be in the same place as in the past.  - Restarted ASA 81 mg daily (anemia has been stable).  - Continue statin, Imdur.   - I will arrange for [employer] tomorrow.  Would plan LHC if there is significant ischemia but medical management if only scar. 2. Acute on chronic systolic CHF: EF 35-36% with wall motion abnormalities on echo this admission. He remains volume overloaded on exam today with JVP 14-16 cm. It also sounds like he has an S3 (versus widely split S2).  -  Continue Lasix at current dose but will add a dose of metolazone today.  If he diureses well on this regimen, will likely need to be dosed with metolazone again tomorrow.  - Needs to cut back fluid intake.  - Increase hydralazine to 50 mg tid and Imdur to 60 daily. - Holding off on beta blocker with active cocaine use.   - Would start lisinopril this admission if creatinine remains stable.   3. HTN: Hypertensive emergency with chest pain and pulmonary edema. He says that he was taking his meds at home. He  was on clonidine and amlodipine at home, which I restarted. Titrate up hydralazine today, would add lisinopril tomorrow if creatinine stable. Would avoid beta blocker with active cocaine abuse.  4. CKD: Mildly higher with diuresis, follow.   5. Anemia: Stable, chronic. Has Fe deficiency anemia with h/o AVMs. Further workup per primary service.   Loralie Champagne 11/28/2013 7:47 AM

## 2013-11-28 NOTE — Progress Notes (Signed)
Reported to PM nurse.  Pt standing in doorway, while we gave report, in no acute distress.

## 2013-11-28 NOTE — Progress Notes (Signed)
LOS: 2 days   Subjective: - Off nitro gtt, restarted antihypertensives, 1 x SBP in 100s which resolved - Chest pain subjectively better, walked 350 ft yesterday with better respiratory status and no increase in CP - Diuresed with IV lasix, but net I/O is even. Wt up 1 lb, pt reportedly drinking a lot but states only eating ice chips  Objective: BP 159/96  Pulse 65  Temp(Src) 98.5 F (36.9 C) (Oral)  Resp 16  Ht 5\' 5"  (1.651 m)  Wt 79.334 kg (174 lb 14.4 oz)  BMI 29.10 kg/m2  SpO2 98%  Intake/Output Summary (Last 24 hours) at 11/28/13 0840 Last data filed at 11/28/13 0744  Gross per 24 hour  Intake 1342.88 ml  Output   1450 ml  Net -107.12 ml    Physical Exam: GEN: sitting up in bed favoring laying on right side, no acute distress EYES: mild scleral icterus, EOMI CV: RRR, normal S1/S2, intermittent S3, no rubs, murmurs PULM: breathing more comfortably, diffusely decreased aeration, markedly decreased crackles with only residual soft crackles at right base, improved JVP, not visible at 30 degrees ABD: soft, NT/ND, +BS EXT: 1+ bilateral LE edema  Labs/Studies: I have reviewed labs and studies from last 24hrs per EMR.  Medications: I have reviewed the patient's current medications.  Assessment/Plan: Principal Problem:   Acute on chronic diastolic heart failure Active Problems:   Iron deficiency anemia   CAD (coronary artery disease)   Chronic kidney disease (CKD), stage III (moderate)   Polysubstance abuse   Unstable angina   Chest pain   Gastric AVM   Malignant hypertension   History of GI bleed  Charles [name] is a 68 year old man with a history of HFpEF, CAD s/p 3VCABG 2009, anemia with h/o GI bleed, and cocaine abuse who presents with chest pain likely 2/2 to acutely decompensated HFpEF.  Chest pain: Subjectively improved. Not worse with walking yesterday. Has used 6 x prn morphine 2q2 over last 24h. ECHO yesterday with reduced EF from prior (40-45 down from  50-55%). - c/s cardiology; appreciate recs - imdur increased from 30 to 60 mg today daily - myocardial perfusion imaging scheduled from tomorrow AM  Acutely decompensated HFpEF: Diuresed with 80 IV BID yesterday, however net even- reportedly drinking too much. Advised to restrict. Will con't lasix and add metolazone. Cr. Up to 1.35 from 1.17, will continue to monitor - con't diuresis with lasix 80 IV BID - add metolazone 2.5 mg once - daily BMP, monitor Cr, replete lytes prn - daily weights; presumed dry wt of 78 kg per last d/c summary - strict I/O, restrict fluid intake  Uncontrolled HTN: Off nitro gtt yesterday, restarted meds, had one episode with SBP 100s and symptomatic. Recovered well and BP has been 150s/80s. Will continue regimen but increase norvac and hydralazine. - increase amlodipine 5 to 10 daily - clonidine 0.3 BID - increased hydralazine 25 to 50 TID - increased imdur 30 to 60 daily   CAD s/p 3VCABG 2009: Denies anginal chest pain. Troponin negative and no EKG changes.  - continue ASA 81, has h/o chronic indolent GI bleed but H/H reassuringly stable - simvastatin 40 daily  Normocytic Anemia: Stable. 2/2 indolent GI bleed.  - pantoprazole 40 daily for GI ppx and h/o bleeding proximal bowel AVM  - daily CBC  - type and screen   Dispo: Floor status.   This is a Careers information officer Note.  The care of the patient was discussed with Dr. Dareen Piano and  the assessment and plan formulated with their assistance.  Please see their attached note for official documentation of the daily encounter.

## 2013-11-28 NOTE — Progress Notes (Signed)
Nutrition Brief Note  RD consulted for CHF diet education.  Patient known to Clinical Nutrition during previous hospitalizations; received diet education in April & May of 2015.  This RD provided "Heart Failure Nutrition Therapy" handouts.  Pt opted to review on his own time.  RD available should patient have questions and/or concerns regarding education material.  Arthur Holms, RD, LDN Pager #: [phone] After-Hours Pager #: [phone]

## 2013-11-29 ENCOUNTER — Inpatient Hospital Stay (HOSPITAL_COMMUNITY): Payer: Medicare Other

## 2013-11-29 DIAGNOSIS — I255 Ischemic cardiomyopathy: Secondary | ICD-10-CM | POA: Diagnosis present

## 2013-11-29 DIAGNOSIS — R079 Chest pain, unspecified: Secondary | ICD-10-CM

## 2013-11-29 DIAGNOSIS — F1910 Other psychoactive substance abuse, uncomplicated: Secondary | ICD-10-CM

## 2013-11-29 LAB — TROPONIN I: Troponin I: <0.3 ng/mL (ref <0.30)

## 2013-11-29 LAB — BASIC METABOLIC PANEL
Anion gap: 14 (ref 5–15)
BUN: 29 mg/dL — AB (ref 6–23)
CHLORIDE: 96 meq/L (ref 96–112)
CO2: 25 meq/L (ref 19–32)
Calcium: 9.1 mg/dL (ref 8.4–10.5)
Creatinine, Ser: 1.29 mg/dL (ref 0.50–1.35)
GFR calc Af Amer: 64 mL/min — ABNORMAL LOW (ref >90)
GFR calc non Af Amer: 55 mL/min — ABNORMAL LOW (ref >90)
Glucose, Bld: 104 mg/dL — ABNORMAL HIGH (ref 70–99)
POTASSIUM: 4.3 meq/L (ref 3.7–5.3)
Sodium: 135 mEq/L — ABNORMAL LOW (ref 137–147)

## 2013-11-29 MED ORDER — AMLODIPINE BESYLATE 10 MG PO TABS
10.0000 mg | ORAL_TABLET | Freq: Every day | ORAL | Status: DC
  Administered 2013-11-30 – 2013-12-01 (×2): 10 mg via ORAL
  Filled 2013-11-29 (×2): qty 1

## 2013-11-29 MED ORDER — NITROGLYCERIN 0.4 MG SL SUBL
0.4000 mg | SUBLINGUAL_TABLET | Freq: Once | SUBLINGUAL | Status: AC
  Administered 2013-11-29: 0.4 mg via SUBLINGUAL

## 2013-11-29 MED ORDER — REGADENOSON 0.4 MG/5ML IV SOLN
INTRAVENOUS | Status: AC
  Administered 2013-11-29: 0.4 mg via INTRAVENOUS
  Filled 2013-11-29: qty 5

## 2013-11-29 MED ORDER — NITROGLYCERIN IN D5W 200-5 MCG/ML-% IV SOLN
2.0000 ug/min | INTRAVENOUS | Status: DC
  Administered 2013-11-29: 40 ug/min via INTRAVENOUS
  Administered 2013-11-30: 50 ug/min via INTRAVENOUS
  Filled 2013-11-29: qty 250

## 2013-11-29 MED ORDER — TECHNETIUM TC 99M SESTAMIBI - CARDIOLITE
30.0000 | Freq: Once | INTRAVENOUS | Status: AC | PRN
  Administered 2013-11-29: 10:00:00 30 via INTRAVENOUS

## 2013-11-29 MED ORDER — NITROGLYCERIN 0.4 MG SL SUBL
SUBLINGUAL_TABLET | SUBLINGUAL | Status: AC
  Filled 2013-11-29: qty 1

## 2013-11-29 MED ORDER — LORAZEPAM 2 MG/ML IJ SOLN
INTRAMUSCULAR | Status: AC
  Filled 2013-11-29: qty 1

## 2013-11-29 MED ORDER — TECHNETIUM TC 99M SESTAMIBI - CARDIOLITE
10.0000 | Freq: Once | INTRAVENOUS | Status: AC | PRN
  Administered 2013-11-29: 09:00:00 10 via INTRAVENOUS

## 2013-11-29 MED ORDER — NITROGLYCERIN IN D5W 200-5 MCG/ML-% IV SOLN
INTRAVENOUS | Status: AC
  Administered 2013-11-29: 50000 ug
  Filled 2013-11-29: qty 250

## 2013-11-29 MED ORDER — HEPARIN BOLUS VIA INFUSION
4000.0000 [IU] | Freq: Once | INTRAVENOUS | Status: AC
  Administered 2013-11-29: 4000 [IU] via INTRAVENOUS
  Filled 2013-11-29: qty 4000

## 2013-11-29 MED ORDER — REGADENOSON 0.4 MG/5ML IV SOLN
0.4000 mg | Freq: Once | INTRAVENOUS | Status: AC
  Administered 2013-11-29: 0.4 mg via INTRAVENOUS
  Filled 2013-11-29: qty 5

## 2013-11-29 MED ORDER — LORAZEPAM 2 MG/ML IJ SOLN
1.0000 mg | Freq: Once | INTRAMUSCULAR | Status: AC
  Administered 2013-11-29: 1 mg via INTRAVENOUS

## 2013-11-29 MED ORDER — HEPARIN (PORCINE) IN NACL 100-0.45 UNIT/ML-% IJ SOLN
1050.0000 [IU]/h | INTRAMUSCULAR | Status: DC
  Administered 2013-11-29: 1050 [IU]/h via INTRAVENOUS
  Filled 2013-11-29 (×2): qty 250

## 2013-11-29 NOTE — Discharge Summary (Signed)
Name: Charles [name] MRN: [id number] DOB: 1945/09/09 68 y.o. PCP: [name]  Date of Admission: 11/26/2013  3:54 AM Date of Discharge: 12/01/2013 Attending Physician: Aldine Contes, MD  Discharge Diagnosis: Principal Problem:   Chest pain and acute on chronic diastolic heart failure, now with new drop in EF Active Problems:   Cardiomyopathy, ischemic- new drop in EF-40-45% (9/12) from 50-55% (4/29)   CAD s/p CABG 2009   Malignant hypertension   Cocaine abuse   COPD   Iron deficiency anemia 2/2 indolent GI bleed (prox bowel AVM s/p ablation)  Discharge Medications:   Medication List    STOP taking these medications       chlorpheniramine 4 MG tablet  Commonly known as:  CHLOR-TRIMETON      TAKE these medications       amLODipine 10 MG tablet  Commonly known as:  NORVASC  Take 1 tablet (10 mg total) by mouth daily.     aspirin EC 81 MG tablet  Take 1 tablet (81 mg total) by mouth daily.     citalopram 20 MG tablet  Commonly known as:  CELEXA  Take 20 mg by mouth daily.     cloNIDine 0.2 MG tablet  Commonly known as:  CATAPRES  Take 1 tablet (0.2 mg total) by mouth 2 (two) times daily.     DSS 100 MG Caps  Take 100 mg by mouth 2 (two) times daily.     ferrous sulfate 325 (65 FE) MG tablet  Take 1 tablet (325 mg total) by mouth 3 (three) times daily with meals.     furosemide 80 MG tablet  Commonly known as:  LASIX  Take 1 tablet (80 mg total) by mouth daily.  Start taking on:  12/02/2013     gabapentin 100 MG capsule  Commonly known as:  NEURONTIN  Take 1 capsule (100 mg total) by mouth 2 (two) times daily.     hydrALAZINE 50 MG tablet  Commonly known as:  APRESOLINE  Take 1 tablet (50 mg total) by mouth every 8 (eight) hours.     isosorbide mononitrate 30 MG 24 hr tablet  Commonly known as:  IMDUR  Take 1 tablet (30 mg total) by mouth daily.     pantoprazole 40 MG tablet  Commonly known as:  PROTONIX  Take 1 tablet (40 mg total) by  mouth 2 (two) times daily.     phenytoin 100 MG ER capsule  Commonly known as:  DILANTIN  Take 2-3 capsules (200-300 mg total) by mouth 2 (two) times daily. Take 2 capsules by mouth in the morning and 3 capsules by mouth at bedtime.     simvastatin 40 MG tablet  Commonly known as:  ZOCOR  Take 1 tablet (40 mg total) by mouth every evening.        Disposition and follow-up:   Mr.Charles [name] was discharged from [facility] in Stable condition.  At the hospital follow up visit please address:  1.  PCP: Pt admitted for acute decompensated heart failure and chest pain potentially 2/2 cocaine use. Please see hospital course for more details. Was diuresed to euvolemia with IV lasix and d/c in an increased dose of PO lasix (80 mg PO daily). Discharge dry weight was 75.5 kg (166lb). On discharge, had slightly increased Cr to 1.59 with a baseline thought to be 1.3-5. Will need follow up BMP to assess renal function. Will also need aggressive counseling on cocaine cessation. States that  he has been trying to get in with a program to help him quit but has continued to have at least weekly use. He was counseled persistently during this hospitalization that his cocaine use is likely causing his chest pain and that continued use with result in his worsening of heart function and death.  Cardiology: Patient admitted for acute decompensated HF with new drop in EF, troponins negative and without EKG changes, and chest pain. Please see hospital course for further details. Counseled extensively about his cocaine use. EF was 40-45% down from prior in April of 50-55%. Admission wt 78.3 kg, d/c presumed dry wt 75.5 with marked improvement in respiratory status and peripheral edema. Increased home lasix from 40 to 80 PO daily. Will need f/u BMP to assess renal function- Cr on discharge had increased to 1.59 with presumed blc 1.3-5. Will need continued counselling on chronic HF medical management.  On discharge, he will be on hydralazine/isosorbine dinitrate, but we are avoiding beta-blockers given his active cocaine use. Please consider starting acei given newly dx HFrEF. Also, held potassium on d/c due to renal failure. Please assess need for restarting potassium with new PO lasix regimen  2.  Labs / imaging needed at time of follow-up: BMP (trend Cr)  3.  Pending labs/ test needing follow-up: n/a  Follow-up Appointments: Follow-up Information   Follow up with Dellia Nims, MD On 12/05/2013. (at 3:15pm)    Specialty:  Internal Medicine   Contact information:   [place] [place] [id number] [phone]       Follow up with Sherryl Barters On 12/04/2013. (at 1:00PM)    Contact information:   Address: [address] [place], [place] [address] [phone]      Discharge Instructions: Discharge Instructions   (HEART FAILURE PATIENTS) Call MD:  Anytime you have any of the following symptoms: 1) 3 pound weight gain in 24 hours or 5 pounds in 1 week 2) shortness of breath, with or without a dry hacking cough 3) swelling in the hands, feet or stomach 4) if you have to sleep on extra pillows at night in order to breathe.    Complete by:  As directed      Call MD for:  difficulty breathing, headache or visual disturbances    Complete by:  As directed      Call MD for:  extreme fatigue    Complete by:  As directed      Call MD for:  persistant dizziness or light-headedness    Complete by:  As directed      Call MD for:  severe uncontrolled pain    Complete by:  As directed      Diet - low sodium heart healthy    Complete by:  As directed      Increase activity slowly    Complete by:  As directed            Consultations: Treatment Team:  Rounding Lbcardiology, MD  Procedures Performed:  Dg Chest Portable 1 View  11/26/2013   CLINICAL DATA:  Chest pain  EXAM: PORTABLE CHEST - 1 VIEW  COMPARISON:  10/23/2013  FINDINGS: Postoperative changes in the mediastinum.  Shallow inspiration. Heart size is enlarged and there is pulmonary vascular congestion centrally. Suggestion of mild interstitial edema in the lung bases. No focal consolidation. No pneumothorax. No blunting of costophrenic angles.  IMPRESSION: Cardiac enlargement with central pulmonary vascular congestion and mild interstitial edema.   Electronically Signed  By: Lucienne Capers M.D.   On: 11/26/2013 04:28    2D Echo: Inferior/inferolateral akinesis with overall mild to moderate LV dysfunction; grade 2 diastolic dysfunction; mildly dilated aortic root (4.5 cm); biatrial enlargement; mild RVE with moderately reduced function. Compared to 07/16/13, LV function has decreased.  Admission HPI: 68 year old man with a history of HFpEF, CAD s/p 3VCABG 2009, anemia with h/o GI bleed, and cocaine abuse who presents with chest pain. Reports laying in bed watching TV last night and experiencing sudden onset of 10/10 sharp, diffuse, chest pain possibly worse with position/palpation and shortness of breath. He called EMS and recieved ASA 325 and NTG x3 with minimal relief of pain. In the ED he had minimal relief of chest pain with nitro gtt. Reports that he had a similar episode one month ago. Per review of chart, had a very similar presentation with sharp chest pain, negative CTA and troponin, ultimately considered 2/2 chronic cough and AoC HFpEF. States that he is still actively using cocaine about 1x/wk with last use on Sunday > 3 days prior to his presentation. States that he usually takes his medications regularly but missed his doses yesterday.  Denies substernal chest pain or discomfort brought on by exertion or relieved with rest. Endorses shortness of breath and recent onset of cough with clear sputum production. Denies history of blood clots or recent travel. Denies headaches, fever. Endorses some nausea with no emesis and resolving abdominal pain. Denies changes in bowel or bladder function.   Admission  PE: Blood pressure 141/80, pulse 79, temperature 98.3 F (36.8 C), temperature source Rectal, resp. rate 19, SpO2 97.00%.  General: awake and alert gentleman sitting upright in bed in mild respiratory distress.  Skin: normal turgur, scattered 1x1 cm purpuric lesions  HEENT: NCAT, EOMI, sclera mildly icteric,  Neck: JVD 5 cm above sternal notch  Cardiovascular: Pulse regular rate and rhythm, S1 with split S2, no murmurs appreciated  Pulmonary: mildly increased work of breathing, diffusely decreased aeration, bibasialar predominant crackles with diffuse intermittent wheezes  Abdomen: Soft, moderately distended, +BS, no masses appreciated, no HSM  Extremities: 1+ bilateral lower extremity edema, equal in diameter, 2+ peripheral pulses in all 4 extremities  Neuro: no focal deficits noted  Hospital Course by problem list:  Probable Unstable Angina: Improved.Patient w/ known CAD s/p CABG (2009 at [facility]; LIMA to LAD + saphenous vein to PDA and 1st/2nd OM) and recent cocaine abuse, admitted for severe, left-sided sharp chest pain. Etiology likely 2/2 volume overload, cocaine use, and uncontrolled HTN. Pt on ASA, Imdur, and Hydralazine. The patient was managed by cardiology in consultation. On the night of 11/29/2013 he experienced a recurrent chest pain 10/10 and EKG revealed ST depression in V2-4. He was restarted on Nitro and Heparin drips but overnight his symptoms improved. Lexiscan from 11/29/2013 w/ large inferior wall defect w/o reversible ischemia, EF 35%. Of note, pt may not be candidate for percutaneous intervention given need remote history of GI bleed and medication non-adherence Pain resolved through hospitalization and at time of discharge was controlled without need for pain medication. He will follow up with his cardiologist Dr Wyline Copas as outpatient on 12/04/2013.   Acutely decompensated HFrEF 40-45%: 4/29 ECHO with normal systolic function, mild hypokinesis inferiorly, and grade 2  diastolic dysfunction. On admission, patient clinically hypervolemic (JVD, BLE edema, bibasilar crackles). Mildly hypoxic with pO2 70 and CXR with central pulmonary edema. BNP 5616 up from 2904 one month ago. Repeat ECHO (9/10) revealed decreased EF  40-45% down from normal EF per 4/29 ECHO. Successfully diuresed with IV lasix and metolazone to euvolemia with dry wt ~76.5 kg (admission 78.3; d/c wt from previous admission 1 month ago was 78 kg). Per chart review, has had multiple admissions for decompensated heart failure. Likely that persistent cocaine is resulting in use drug induced-tachycardia and hypertension resulting in acute decompensated heart failure. At time of discharge, pt improved with resolution of hypervolemia on exam as well as improvement in respiratory status. Will be discharged on hydralazine/imdur, but not beta-blocker given active cocaine use. He was discharged on Lasix 80 mg daily and this can be adjusted based on volume status at outpatient follow up.   Malignant Hypertension: Pt with BP 184/109 on admission with pulmonary edema and chest pain as stated above. Per chart review, BP was up to 204/89 during last hospitalization. Was discharged on amlodipine 10, clonidine 0.3, and lasix 80 daily. Staying away from beta-blocker therapy given cocaine use. Initially on nitro gtt, then transitioned to amlodipine 10 daily, clonidine 0.3 BID, hydralazine 50 TID, and imdur 60 daily. BP stable at time of discharge.   CAD s/p 4vCABG 2009: Denies anginal chest pain. Troponin negative and no EKG changes. Last left heart cath that was able to be obtained was in 2009 during hospitalization for CABG (LIMA to LAD, SVG to OM1, SVG to distal circumflex, SVG to to PDA). LHC at that time revealed LAD 50%, Prox CX 70%, OM1 80%, RCA 100% stenoses. Controlled on ASA 81 and home zocor.  COPD: Pt has significant smoking history with mention of COPD in chart review but denies having taken any controller or rescue  medications in the past.  Per 2009 outside records has PFT with FEV1:FVC of 42%. Exam significant for wheezing which may be 2/2 decompensated heart failure, mildly hypoxic, with some new onset sputum production. Given duonebs prn with some improvement in wheezing. Will need to follow up with PCP for chronic COPD management.   Chronic stable asymptomatic anemia 2/2 indolent GIB: Hb 8.0 on admission which was stable with his discharge Hb of 8.0 on 7/29. Per chart review, pt has long history of Hb 7-9. Has history of upper GI AVM bleed in 2012 s/p EGD revealing not actively bleeding AVM now s/p ablation. Iron studies during last hospitalization c/w iron deficiency anemia with no obvious chronic bleeding source. Hb remained stable throughout admission.   Discharge Vitals:   BP 138/63  Pulse 71  Temp(Src) 97.6 F (36.4 C) (Oral)  Resp 19  Ht 5\' 5"  (1.651 m)  Wt 75.5 kg (166 lb 7.2 oz)  BMI 27.70 kg/m2  SpO2 100%  Discharge Labs:  Results for orders placed during the hospital encounter of 11/26/13 (from the past 24 hour(s))  CBC     Status: Abnormal   Collection Time    12/01/13  4:49 AM      Result Value Ref Range   WBC 4.7  4.0 - 10.5 K/uL   RBC 3.56 (*) 4.22 - 5.81 MIL/uL   Hemoglobin 8.5 (*) 13.0 - 17.0 g/dL   HCT 27.2 (*) 39.0 - 52.0 %   MCV 76.4 (*) 78.0 - 100.0 fL   MCH 23.9 (*) 26.0 - 34.0 pg   MCHC 31.3  30.0 - 36.0 g/dL   RDW 17.0 (*) 11.5 - 15.5 %   Platelets 305  150 - 400 K/uL  GLUCOSE, CAPILLARY     Status: Abnormal   Collection Time    12/01/13  7:37 AM  Result Value Ref Range   Glucose-Capillary 106 (*) 70 - 99 mg/dL    Signed: Jessee Avers, MD Internal Medicine Resident, PGY [facility] 12/01/2013 2:56 PM     Services Ordered on Discharge: n/a Equipment Ordered on Discharge: n/a

## 2013-11-29 NOTE — Progress Notes (Signed)
ANTICOAGULATION CONSULT NOTE - Initial Consult  Pharmacy Consult for  heparin Indication: chest pain/ACS  Allergies  Allergen Reactions  . Motrin [Ibuprofen] Other (See Comments)    Affects kidneys    Patient Measurements: Height: 5\' 5"  (165.1 cm) Weight: 168 lb 14 oz (76.6 kg) IBW/kg (Calculated) : 61.5   Vital Signs: Temp: 97.6 F (36.4 C) (09/12 2016) Temp src: Oral (09/12 2016) BP: 122/63 mmHg (09/12 2016) Pulse Rate: 72 (09/12 2016)  Labs:  Recent Labs  11/26/13 2138 11/27/13 0400 11/27/13 0401 11/28/13 0311 11/29/13 0608  HGB  --   --  8.2* 8.1*  --   HCT  --   --  27.3* 26.7*  --   PLT  --   --  268 265  --   CREATININE  --   --  1.17 1.35 1.29  TROPONINI <0.30 <0.30  --   --   --     Estimated Creatinine Clearance: 51.6 ml/min (by C-G formula based on Cr of 1.29).   Medical History: Past Medical History  Diagnosis Date  . Iron deficiency anemia   . Chronic diastolic heart failure     a. 2D ECHO 2015 50-55% and G2DD  . Hyperlipidemia LDL goal <70   . Obstructive sleep apnea     a. not on home CPAP  . Ischemic cardiomyopathy     a. EF now normalized (2012 EF 35-40%)  . Homelessness   . 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 Polysubstance abuse     a. cocaine, THC  . AV malformation of gastrointestinal tract   . Family history of early CAD   . DDD (degenerative disc disease), lumbar   . Peripheral vascular disease   . Seizure disorder   . Seizures   . Hypertension   . CAD (coronary artery disease)     a. s/p 3-vessel CABG (12/2007) // 100% RCA stenosis with collaterals from left system. Severe bifurcation lesions of proxima CXA and OM. Moderate LAD diseaseFollow[facility]  . Diabetes   . Chronic kidney disease (CKD), stage III (moderate)     BL SCr 1.5-1.6  . Renal artery stenosis     Medications:  Scheduled:  . [START ON 11/30/2013] amLODipine  10 mg  Oral Daily  . aspirin EC  81 mg Oral Daily  . atorvastatin  20 mg Oral q1800  . citalopram  20 mg Oral Daily  . cloNIDine  0.2 mg Oral BID  . feeding supplement (ENSURE COMPLETE)  237 mL Oral BID BM  . furosemide  80 mg Intravenous BID  . gabapentin  100 mg Oral BID  . hydrALAZINE  50 mg Oral 3 times per day  . isosorbide mononitrate  60 mg Oral Daily  . pantoprazole  40 mg Oral BID  . phenytoin  200 mg Oral q morning - 10a  . phenytoin  300 mg Oral QHS  . potassium chloride  20 mEq Oral Daily    Assessment: 68 yo male with CP to start heparin for r/o ACS.  Hg= 8.1, plt= 265. No recent heparin or lovenox sq.  Goal of Therapy:  Heparin level 0.3-0.7 units/ml Monitor platelets by anticoagulation protocol: Yes   Plan:  -Heparin bolus 4000 units IV followed by 1050 units/hr (~ 14units/kg/hr) -Heparin level in 6 hours and daily wth CBC daily  Hildred Laser, Pharm D 11/29/2013 8:25 PM

## 2013-11-29 NOTE — Progress Notes (Signed)
   LOS: 3 days   Subjective: - NAEON, MPI this AM - Denies CP  Objective: BP 125/70  Pulse 57  Temp(Src) 98.1 F (36.7 C) (Oral)  Resp 19  Ht 5\' 5"  (1.651 m)  Wt 76.613 kg (168 lb 14.4 oz)  BMI 28.11 kg/m2  SpO2 98%  Intake/Output Summary (Last 24 hours) at 11/29/13 1023 Last data filed at 11/29/13 0600  Gross per 24 hour  Intake    480 ml  Output   4150 ml  Net  -3670 ml    Physical Exam: GEN: sitting up in bed favoring laying on right side, no acute distress EYES: mild scleral icterus, EOMI CV: RRR, normal S1/S2, intermittent S3, no rubs, murmurs PULM: breathing more comfortably, lungs much more clear today, very faint crackles at right base, otherwise clear and with no wheezing today ABD: soft, NT/ND, +BS EXT: 1+ bilateral LE edema  Labs/Studies: I have reviewed labs and studies from last 24hrs per EMR.  Medications: I have reviewed the patient's current medications.  Assessment/Plan: Principal Problem:   Acute on chronic diastolic heart failure Active Problems:   Iron deficiency anemia   CAD (coronary artery disease)   Chronic kidney disease (CKD), stage III (moderate)   Polysubstance abuse   Chest pain   Cocaine abuse   Gastric AVM   Malignant hypertension   History of GI bleed   Cardiomyopathy, ischemic- new drop in EF-40-45%  Charles [name] is a 68 year old man with a history of HFpEF, CAD s/p 3VCABG 2009, anemia with h/o GI bleed, and cocaine abuse who presents with chest pain likely mixed etiology 2/2 cocaine and acutely decompensated HFpEF.  Chest pain: Denies CP today. D/c'd morphine prns yesterday, added tylenol but none used o/n.  - c/s cardiology; appreciate recs - imdur 60 mg today daily - follow up myocardial perfusion study and cardiology recs  Acutely decompensated HFrEF: Diuresed with lasix 80 IV BID and 2.5 metolazone with 4.5L UOP. Restricted fluid intake. Net -3.5L over last shift with Cr down trend 1.29 from 1.35. Per cards will  continue with lasix 80IV BID - c/s cardiology; appreciate recs - con't diuresis with lasix 80 IV BID - daily BMP, monitor Cr, replete lytes prn; potassium chloride 20 mEq daily - strict I/O, restrict fluid intake  Uncontrolled HTN: Improved control after restarting home medication, increasing norvasc, hydralazine, and imdur - amlodipine 10 daily - clonidine 0.2 BID - hydralazine 50 TID - imdur 60 daily   CAD s/p 4vCABG 2009: Denies anginal chest pain. Troponin negative and no EKG changes.  - continue ASA 81, has h/o chronic indolent GI bleed but H/H reassuringly stable - atorvastatin 20 daily  Normocytic Anemia: Stable. 2/2 indolent GI bleed.  - pantoprazole 40 daily for GI ppx and h/o bleeding proximal bowel AVM  - daily CBC  - type and screen   Dispo: Floor status.   This is a Careers information officer Note.  The care of the patient was discussed with Dr. Eulas Post and the assessment and plan formulated with their assistance.  Please see their attached note for official documentation of the daily encounter.

## 2013-11-29 NOTE — Progress Notes (Signed)
INTERNAL MEDICINE TEACHING SERVICE Night Float Progress Note   Subjective:    We were called overnight by the RN for evaluation of chest pain. At time of evaluation, pt stated he was having 10/10, constant, sharp chest pain located on the left side of his chest and radiating into his left arm, but not to his neck or abdomen. Pain was associated with mild nausea without vomiting and mild shortness of breath. This pain continued despite nitro and felt similar in character to the chest pain he had on admission. Patient states he was lying in bed watching tv when the chest pain started. Patient denies any visitors or cocaine use since his admission. Patient is requesting dilaudid for pain.   Objective:    BP 133/57  Pulse 74  Temp(Src) 97.2 F (36.2 C) (Oral)  Resp 18  Ht 5\' 5"  (1.651 m)  Wt 168 lb 14.4 oz (76.613 kg)  BMI 28.11 kg/m2  SpO2 100%   Labs: Basic Metabolic Panel:    Component Value Date/Time   NA 135* 11/29/2013 0608   K 4.3 11/29/2013 0608   CL 96 11/29/2013 0608   CO2 25 11/29/2013 0608   BUN 29* 11/29/2013 0608   CREATININE 1.29 11/29/2013 0608   CREATININE 1.32 05/11/2010 2300   GLUCOSE 104* 11/29/2013 0608   CALCIUM 9.1 11/29/2013 0608    CBC:    Component Value Date/Time   WBC 5.5 11/28/2013 0311   HGB 8.1* 11/28/2013 0311   HCT 26.7* 11/28/2013 0311   PLT 265 11/28/2013 0311   MCV 79.2 11/28/2013 0311   NEUTROABS 3.0 11/26/2013 0430   LYMPHSABS 1.2 11/26/2013 0430   MONOABS 0.4 11/26/2013 0430   EOSABS 0.4 11/26/2013 0430   BASOSABS 0.0 11/26/2013 0430    Cardiac Enzymes: Lab Results  Component Value Date   CKTOTAL 41 05/17/2010   CKMB 2.1 05/17/2010   TROPONINI <0.30 11/27/2013    Physical Exam: General: Vital signs reviewed and noted. Well-developed, well-nourished, in no acute distress; alert, appropriate and cooperative throughout examination.  Lungs:  Normal respiratory effort. Clear to auscultation BL without crackles or wheezes.  Heart: RRR. S1 and S2 normal  with  Gallop.  Abdomen:  BS normoactive. Soft, Nondistended, non-tender.  No masses or organomegaly.  Extremities: No pretibial edema.    EKG: EKG showed ST depression in leads V2-V5 changed from prior EKG.   Assessment/ Plan:    Chest Pain with new ST depressions. New EKG findings were discussed with Dr. Elias Else, Cardiology. Patient was started on a nitroglycerin drip and heparin drip per pharmacy. Made NPO for possible future procedures. Patient was given Ativan 1 mg once. Trending troponins x 3. Patient was transferred to [facility]. Cardiology will follow.   Osa Craver, DO PGY-1 Internal Medicine Resident Pager # [phone] 11/29/2013 8:16 PM

## 2013-11-29 NOTE — Progress Notes (Signed)
1915: Patient complaining of chest pain 10/10. Describes the pain as sharp. VSS. Nitro x1. Rapid response and MD have been paged. EKG received.   1920: Nitro x1 given. RR nurse at beside. Cardiologist has been paged and has returned the paged. On his way to see the patient.   1925: Nitro X1 given. Patient being transferred to [facility]. Teaching service and cardiologist at bedside. Nitro infusion and Heparin infusion started

## 2013-11-29 NOTE — Progress Notes (Signed)
Subjective:  Chest pain overnight-no NTG-"only Dilaudid helps"  Objective:  Vital Signs in the last 24 hours: Temp:  [97.7 F (36.5 C)-98.4 F (36.9 C)] 98.1 F (36.7 C) (09/12 0426) Pulse Rate:  [56-76] 57 (09/12 0426) Resp:  [19-23] 19 (09/12 0426) BP: (118-170)/(64-84) 170/79 mmHg (09/12 1023) SpO2:  [96 %-98 %] 98 % (09/12 0426) Weight:  [168 lb 14.4 oz (76.613 kg)] 168 lb 14.4 oz (76.613 kg) (09/12 0426)  Intake/Output from previous day:  Intake/Output Summary (Last 24 hours) at 11/29/13 1025 Last data filed at 11/29/13 0600  Gross per 24 hour  Intake    480 ml  Output   4150 ml  Net  -3670 ml    Physical Exam: General appearance: alert, cooperative and no distress Lungs: clear to auscultation bilaterally Heart: regular rate and rhythm   Rate: 58  Rhythm: normal sinus rhythm, sinus bradycardia and premature ventricular contractions (PVC)  Lab Results:  Recent Labs  11/27/13 0401 11/28/13 0311  WBC 5.9 5.5  HGB 8.2* 8.1*  PLT 268 265    Recent Labs  11/28/13 0311 11/29/13 0608  NA 137 135*  K 4.1 4.3  CL 99 96  CO2 22 25  GLUCOSE 116* 104*  BUN 26* 29*  CREATININE 1.35 1.29    Recent Labs  11/26/13 2138 11/27/13 0400  TROPONINI <0.30 <0.30   No results found for this basename: INR,  in the last 72 hours  Imaging: Imaging results have been reviewed  Cardiac Studies:  Assessment/Plan:   Principal Problem:   Acute on chronic diastolic heart failure Active Problems:   CAD (coronary artery disease)   Chest pain   Cardiomyopathy, ischemic- new drop in EF-40-45%   Chronic kidney disease (CKD), stage III (moderate)   Polysubstance abuse   Cocaine abuse   Malignant hypertension   Iron deficiency anemia   Gastric AVM   History of GI bleed    PLAN: Lexiscan Myoview today- tolerated well. Frequent PVCs and a 10 bt run of PAT noted. No cheswt pain but abdominal discomfort with Lexiscan.  Kerin Ransom PA-C Beeper  [phone] 11/29/2013, 10:25 AM   Agree with above.   Scheduled Meds: . [START ON 11/30/2013] amLODipine  10 mg Oral Daily  . aspirin EC  81 mg Oral Daily  . atorvastatin  20 mg Oral q1800  . citalopram  20 mg Oral Daily  . cloNIDine  0.2 mg Oral BID  . feeding supplement (ENSURE COMPLETE)  237 mL Oral BID BM  . furosemide  80 mg Intravenous BID  . gabapentin  100 mg Oral BID  . hydrALAZINE  50 mg Oral 3 times per day  . isosorbide mononitrate  60 mg Oral Daily  . pantoprazole  40 mg Oral BID  . phenytoin  200 mg Oral q morning - 10a  . phenytoin  300 mg Oral QHS  . potassium chloride  20 mEq Oral Daily   Continuous Infusions:  PRN Meds:.acetaminophen, ipratropium-albuterol   1. Atypical chest pain-he has been having chest pain off and on status post bypass surgery. Troponin is negative. It is possible that this is related to cocaine use as well as uncontrolled hypertension. Awaiting results of Myoview.  2. Coronary artery disease status post bypass-as above.  3. Acute on chronic systolic/diastolic heart failure-previous echocardiogram in April of 2015 demonstrated ejection fraction of 50-55%. Current echocardiogram shows decrease of 45% - Inferior/inferolateral akinesis with overall mild to moderate LV dysfunction; grade 2 diastolic dysfunction; mildly dilated  aortic root (4.5 cm); biatrial enlargement; mild RVE with moderately reduced function. Compared to 07/16/13, LV function has decreased.  - Continue with hydralazine 50 every 8, isosorbide 60  - Avoiding beta blockers because of cocaine use  - Continue amlodipine, I will increase to 10 mg.  - Continue IV Lasix 80 mg twice a day. -5.4 L  4. Anemia -- chronic iron deficiency anemia with history of AVMs. Per primary service.  5. Accelerated hypertension-previous hypertensive emergency with chest pain and pulmonary edema. Medications as above.

## 2013-11-29 NOTE — Progress Notes (Signed)
Cardiology Cross Cover Note Called by rapid response due to patient with recurrent chest pain and now with dynamic EKG changes.  Briefly, 68 yo male with PMH sig for CAD s/p CABG 2009, HTN, COPD, ischemic CMP with EF 35%, continued cocaine abuse (positive UDS on 9/9)  presenting on 9/9 with chest pain. Seen by cardiology (negative troponins) and underwent lexiscan today which demonstrated large inferior infarct, no reversibility and reduced EF at 35%.   This evening, had sudden onset of substernal chest pain ("10/10"). Seen by rapid response who noted that the repeat EKG demonstrated downsloping ST depressions anteriorly.  Seen both in the floor and in stepdown.  Difficult to assess chest pain as he reports it has been constant this entire admission, he just pays attention to it more sometimes. Only thing that has help has been the dilaudid.  BP 133/57  Pulse 74  Temp(Src) 97.2 F (36.2 C) (Oral)  Resp 18  Ht 5\' 5"  (1.651 m)  Wt 76.613 kg (168 lb 14.4 oz)  BMI 28.11 kg/m2  SpO2 100% Gen: NAD HEENT: PERRL Lung: CTAB CV: RRR, no m/r/g Abd: NT, NABs Ext: no edema   68 yo male with PMH sig for CAD s/p CABG 2009, HTN, COPD, ischemic CMP with EF 35%, continued cocaine abuse (positive UDS on 9/9)  presenting on 9/9 with chest pain. Ruled out for infarct and underwent lexiscan with did not demonstrate ischemia (large inferior infarct) with reduced EF. However, acute on chronic chest pain today associated with ST depressions on EKG.  Drug seek behaviour makes assessment of chest pain difficult. Will use objective evidence of EKG and troponins to guide. Differential includes vasospasm vs. True unstable atherosclerotic plaque.   Recommendations: -nitro gtt (to attain chest pain free, increase until CP free or SBP < 95) -heparin gtt -aspirin 81 mg -NPO for possible procedure -trend troponins -avoid beta blockers  Will follow up in the AM. Please call with questions.

## 2013-11-29 NOTE — Progress Notes (Signed)
Subjective: Pt states that he is doing ok today. He currently denies chest pain. Dry cough is improving. Pt just back from [place], results pending. Diuresing well.   Objective: Vital signs in last 24 hours: Filed Vitals:   11/29/13 1027 11/29/13 1029 11/29/13 1031 11/29/13 1204  BP: 154/86 140/64 135/61 157/70  Pulse:    63  Temp:      TempSrc:      Resp:      Height:      Weight:      SpO2:       Weight change: -5 lb (-2.268 kg)  Intake/Output Summary (Last 24 hours) at 11/29/13 1339 Last data filed at 11/29/13 1327  Gross per 24 hour  Intake    240 ml  Output   3725 ml  Net  -3485 ml   Vitals reviewed. General: Sitting on the side of the bed, NAD HEENT: PERRL, EOMI Cardiac: RRR, no rubs, murmurs or gallops Pulm: CTAB, no wheezing Abd: Soft, nontender, nondistended, BS present Ext: warm and well perfused, no pedal edema present Neuro: alert and oriented X3, cranial nerves II-XII grossly intact, moves all 4 extremities w/o difficulty.   Lab Results: Basic Metabolic Panel:  Recent Labs Lab 11/28/13 0311 11/29/13 0608  NA 137 135*  K 4.1 4.3  CL 99 96  CO2 22 25  GLUCOSE 116* 104*  BUN 26* 29*  CREATININE 1.35 1.29  CALCIUM 8.3* 9.1   CBC:  Recent Labs Lab 11/26/13 0430 11/27/13 0401 11/28/13 0311  WBC 5.1 5.9 5.5  NEUTROABS 3.0  --   --   HGB 8.0* 8.2* 8.1*  HCT 25.7* 27.3* 26.7*  MCV 80.6 79.8 79.2  PLT 247 268 265   Cardiac Enzymes:  Recent Labs Lab 11/26/13 1600 11/26/13 2138 11/27/13 0400  TROPONINI <0.30 <0.30 <0.30   BNP:  Recent Labs Lab 11/26/13 0430  PROBNP 5616.0*   Urine Drug Screen: Drugs of Abuse     Component Value Date/Time   LABOPIA NONE DETECTED 11/26/2013 1023   COCAINSCRNUR POSITIVE* 11/26/2013 1023   LABBENZ NONE DETECTED 11/26/2013 1023   AMPHETMU NONE DETECTED 11/26/2013 1023   THCU NONE DETECTED 11/26/2013 1023   LABBARB NONE DETECTED 11/26/2013 1023      Micro Results: Recent Results (from the past 240  hour(s))  MRSA PCR SCREENING     Status: None   Collection Time    11/26/13  3:51 PM      Result Value Ref Range Status   MRSA by PCR NEGATIVE  NEGATIVE Final   Comment:            The GeneXpert MRSA Assay (FDA     approved for NASAL specimens     only), is one component of a     comprehensive MRSA colonization     surveillance program. It is not     intended to diagnose MRSA     infection nor to guide or     monitor treatment for     MRSA infections.   Studies/Results: No results found. Medications: I have reviewed the patient's current medications. Scheduled Meds: . [START ON 11/30/2013] amLODipine  10 mg Oral Daily  . aspirin EC  81 mg Oral Daily  . atorvastatin  20 mg Oral q1800  . citalopram  20 mg Oral Daily  . cloNIDine  0.2 mg Oral BID  . feeding supplement (ENSURE COMPLETE)  237 mL Oral BID BM  . furosemide  80 mg Intravenous BID  .  gabapentin  100 mg Oral BID  . hydrALAZINE  50 mg Oral 3 times per day  . isosorbide mononitrate  60 mg Oral Daily  . pantoprazole  40 mg Oral BID  . phenytoin  200 mg Oral q morning - 10a  . phenytoin  300 mg Oral QHS  . potassium chloride  20 mEq Oral Daily   Continuous Infusions:  PRN Meds:.acetaminophen, ipratropium-albuterol  Assessment/Plan: 68 y.o. male w/ PMHx of HTN, HLD, dCHF, PAH, PVD, CAD s/p CABG (2009), CKD, and cocaine abuse was admitted for chest pain in the setting of uncontrolled HTN, recent cocaine use, and volume overload.  Chest Pain/Angina: Patient w/ known CAD s/p CABG (2009 @ [place]; LIMA to LAD + saphenous vein to PDA and 1st/2nd OM) and recent cocaine abuse, admitted for severe, left-sided sharp chest pain. Etiology likely 2/2 volume overload, cocaine use, and uncontrolled HTN. Started on Nitro gtt on admission which has been discontinued. Seen by HF team who started ASA, Imdur, and Hydralazine. Troponins negative, EKG w/o significant changes. ECHO with reduced EF to 40-45% w/ wall motion  abnormalities which was a change from April with EF 50-55%. Lexiscan today, results pending. Working on improving BP control. - Continue ASA  - Increasing Amlodipine to 10mg  daily - Imdur 60mg  qd  - Continue Hydralazine - Continue Zocor 40 mg qhs  - Tylenol 650mg  PRN for pain  - F/u with Cardiology, appreciate recommendations   Acute on Chronic dCHF: Most recent ECHO from 07/16/13 shows EF of 50-55% w/ possible mild basal/midinferolateral hypokinesis and grade 2 diastolic dysfunction. On Lasix 80 mg qd at home. Given 80 mg IV in ED and started on 80 mg IV bid for now. HF team concerned that the pt is taking in too much liquid. Net -5.4L. Continuing diuresis per Cardiology. -Continue Lasix per Cards -Imdur as above + Hydralazine 25 mg q8h for decreased afterload.  -Daily weights, strict I/O's   HTN: Likely non-compliant w/ home BP meds, presented w/ hypertensive urgency. Titrated off Nitro gtt. BP elevated today, increasing Amlodipine to 10mg  daily. - Norvasc 10mg  qd  - Continue Clonidine 0.2 mg bid  - Imdur and Lasix as above  - Continue Hydralazine 25 mg q8h prn   Normocytic anemia: H/o significant anemia w/ iron deficiency, also w/ AVM's on EGD. Patient likely has occult GI losses. Hb 8.1 this AM, near baseline.  -Repeat CBC in AM  -Will need outpatient GI follow up  -Transfuse Hb <7  PLEASE REFER TO MS-IV NOTE FROM TODAY FOR THE REMAINDER OF THE PLAN    Dispo: Disposition is deferred at this time, awaiting improvement of current medical problems.  Anticipated discharge in approximately 2-3 day(s).   The patient does have a current PCP [name]) and does not need an [facility] hospital follow-up appointment after discharge.  The patient does not have transportation limitations that hinder transportation to clinic appointments.  .Services Needed at time of discharge: Y = Yes, Blank = No PT:   OT:   RN:   Equipment:   Other:     LOS: 3 days   Otho Bellows, MD 11/29/2013,  1:39 PM

## 2013-11-30 DIAGNOSIS — I5033 Acute on chronic diastolic (congestive) heart failure: Secondary | ICD-10-CM

## 2013-11-30 DIAGNOSIS — I2589 Other forms of chronic ischemic heart disease: Secondary | ICD-10-CM

## 2013-11-30 DIAGNOSIS — I2510 Atherosclerotic heart disease of native coronary artery without angina pectoris: Secondary | ICD-10-CM

## 2013-11-30 DIAGNOSIS — R079 Chest pain, unspecified: Secondary | ICD-10-CM

## 2013-11-30 DIAGNOSIS — I509 Heart failure, unspecified: Secondary | ICD-10-CM

## 2013-11-30 DIAGNOSIS — D649 Anemia, unspecified: Secondary | ICD-10-CM

## 2013-11-30 DIAGNOSIS — I25810 Atherosclerosis of coronary artery bypass graft(s) without angina pectoris: Secondary | ICD-10-CM

## 2013-11-30 DIAGNOSIS — I209 Angina pectoris, unspecified: Secondary | ICD-10-CM

## 2013-11-30 DIAGNOSIS — I5022 Chronic systolic (congestive) heart failure: Secondary | ICD-10-CM

## 2013-11-30 DIAGNOSIS — F1410 Cocaine abuse, uncomplicated: Secondary | ICD-10-CM

## 2013-11-30 DIAGNOSIS — I129 Hypertensive chronic kidney disease with stage 1 through stage 4 chronic kidney disease, or unspecified chronic kidney disease: Secondary | ICD-10-CM

## 2013-11-30 DIAGNOSIS — N189 Chronic kidney disease, unspecified: Secondary | ICD-10-CM

## 2013-11-30 LAB — CBC
HCT: 26.4 % — ABNORMAL LOW (ref 39.0–52.0)
HEMOGLOBIN: 8.2 g/dL — AB (ref 13.0–17.0)
MCH: 23.8 pg — ABNORMAL LOW (ref 26.0–34.0)
MCHC: 31.1 g/dL (ref 30.0–36.0)
MCV: 76.5 fL — ABNORMAL LOW (ref 78.0–100.0)
Platelets: 290 10*3/uL (ref 150–400)
RBC: 3.45 MIL/uL — ABNORMAL LOW (ref 4.22–5.81)
RDW: 17.2 % — ABNORMAL HIGH (ref 11.5–15.5)
WBC: 5.5 10*3/uL (ref 4.0–10.5)

## 2013-11-30 LAB — HEPARIN LEVEL (UNFRACTIONATED): HEPARIN UNFRACTIONATED: 0.36 [IU]/mL (ref 0.30–0.70)

## 2013-11-30 LAB — BASIC METABOLIC PANEL
Anion gap: 11 (ref 5–15)
BUN: 34 mg/dL — AB (ref 6–23)
CO2: 29 meq/L (ref 19–32)
CREATININE: 1.59 mg/dL — AB (ref 0.50–1.35)
Calcium: 8.9 mg/dL (ref 8.4–10.5)
Chloride: 95 mEq/L — ABNORMAL LOW (ref 96–112)
GFR calc Af Amer: 49 mL/min — ABNORMAL LOW (ref >90)
GFR calc non Af Amer: 43 mL/min — ABNORMAL LOW (ref >90)
Glucose, Bld: 100 mg/dL — ABNORMAL HIGH (ref 70–99)
POTASSIUM: 4.4 meq/L (ref 3.7–5.3)
Sodium: 135 mEq/L — ABNORMAL LOW (ref 137–147)

## 2013-11-30 LAB — TROPONIN I
Troponin I: <0.3 ng/mL (ref <0.30)
Troponin I: <0.3 ng/mL (ref <0.30)

## 2013-11-30 MED ORDER — HEPARIN SODIUM (PORCINE) 5000 UNIT/ML IJ SOLN
5000.0000 [IU] | Freq: Three times a day (TID) | INTRAMUSCULAR | Status: DC
  Administered 2013-11-30: 5000 [IU] via SUBCUTANEOUS
  Filled 2013-11-30 (×4): qty 1

## 2013-11-30 MED ORDER — ISOSORBIDE MONONITRATE ER 30 MG PO TB24
30.0000 mg | ORAL_TABLET | Freq: Every day | ORAL | Status: DC
  Administered 2013-11-30 – 2013-12-01 (×2): 30 mg via ORAL
  Filled 2013-11-30 (×2): qty 1

## 2013-11-30 NOTE — Progress Notes (Signed)
ANTICOAGULATION CONSULT NOTE - Follow Up Consult  Pharmacy Consult for heparin Indication: chest pain/ACS  Labs:  Recent Labs  11/28/13 0311 11/29/13 0608 11/29/13 1958 11/30/13 0230 11/30/13 0530  HGB 8.1*  --   --   --  8.2*  HCT 26.7*  --   --   --  26.4*  PLT 265  --   --   --  290  HEPARINUNFRC  --   --   --   --  0.36  CREATININE 1.35 1.29  --   --   --   TROPONINI  --   --  <0.30 <0.30  --     Assessment/Plan:  68yo male therapeutic on heparin with initial dosing for CP. Will continue gtt at current rate and confirm stable with additional level.   Wynona Neat, PharmD, BCPS  11/30/2013,6:24 AM

## 2013-11-30 NOTE — Progress Notes (Signed)
Patient Name: Charles [name] Date of Encounter: 11/30/2013  Principal Problem:   Acute on chronic diastolic heart failure Active Problems:   Iron deficiency anemia   CAD (coronary artery disease)   Chronic kidney disease (CKD), stage III (moderate)   Polysubstance abuse   Chest pain   Cocaine abuse   Gastric AVM   Malignant hypertension   History of GI bleed   Cardiomyopathy, ischemic- new drop in EF-40-45%   Length of Stay: 4  SUBJECTIVE  Chest pain is constant, all day long. Looks very comfortable, eager to eat.  Nuclear perfusion images reviewed. Consistent with old inferior MI, no reversible ischemia  CURRENT MEDS . amLODipine  10 mg Oral Daily  . aspirin EC  81 mg Oral Daily  . atorvastatin  20 mg Oral q1800  . citalopram  20 mg Oral Daily  . cloNIDine  0.2 mg Oral BID  . feeding supplement (ENSURE COMPLETE)  237 mL Oral BID BM  . furosemide  80 mg Intravenous BID  . gabapentin  100 mg Oral BID  . hydrALAZINE  50 mg Oral 3 times per day  . isosorbide mononitrate  60 mg Oral Daily  . pantoprazole  40 mg Oral BID  . phenytoin  200 mg Oral q morning - 10a  . phenytoin  300 mg Oral QHS  . potassium chloride  20 mEq Oral Daily    OBJECTIVE   Intake/Output Summary (Last 24 hours) at 11/30/13 1255 Last data filed at 11/30/13 1100  Gross per 24 hour  Intake 1204.88 ml  Output   4450 ml  Net -3245.12 ml   Filed Weights   11/29/13 0426 11/29/13 2016 11/30/13 0500  Weight: 168 lb 14.4 oz (76.613 kg) 168 lb 14 oz (76.6 kg) 166 lb 10.7 oz (75.6 kg)    PHYSICAL EXAM Filed Vitals:   11/30/13 0500 11/30/13 0508 11/30/13 0810 11/30/13 1141  BP:  124/67 125/69 98/37  Pulse:      Temp:   97.7 F (36.5 C) 98.3 F (36.8 C)  TempSrc:   Oral Oral  Resp:   19 18  Height:      Weight: 166 lb 10.7 oz (75.6 kg)     SpO2:   100% 99%   General: Alert, oriented x3, no distress Head: no evidence of trauma, PERRL, EOMI, no exophtalmos or lid lag, no myxedema, no  xanthelasma; normal ears, nose and oropharynx Neck: normal jugular venous pulsations and no hepatojugular reflux; brisk carotid pulses without delay and no carotid bruits Chest: clear to auscultation, no signs of consolidation by percussion or palpation, normal fremitus, symmetrical and full respiratory excursions Cardiovascular: normal position and quality of the apical impulse, regular rhythm, normal first and second heart sounds, no rubs or gallops, no murmur Abdomen: no tenderness or distention, no masses by palpation, no abnormal pulsatility or arterial bruits, normal bowel sounds, no hepatosplenomegaly Extremities: no clubbing, cyanosis or edema; 2+ radial, ulnar and brachial pulses bilaterally; 2+ right femoral, posterior tibial and dorsalis pedis pulses; 2+ left femoral, posterior tibial and dorsalis pedis pulses; no subclavian or femoral bruits Neurological: grossly nonfocal  LABS  CBC  Recent Labs  11/28/13 0311 11/30/13 0530  WBC 5.5 5.5  HGB 8.1* 8.2*  HCT 26.7* 26.4*  MCV 79.2 76.5*  PLT 265 595   Basic Metabolic Panel  Recent Labs  11/29/13 0608 11/30/13 0530  NA 135* 135*  K 4.3 4.4  CL 96 95*  CO2 25 29  GLUCOSE 104* 100*  BUN 29* 34*  CREATININE 1.29 1.59*  CALCIUM 9.1 8.9   Liver Function Tests No results found for this basename: AST, ALT, ALKPHOS, BILITOT, PROT, ALBUMIN,  in the last 72 hours No results found for this basename: LIPASE, AMYLASE,  in the last 72 hours Cardiac Enzymes  Recent Labs  11/29/13 1958 11/30/13 0230 11/30/13 0735  TROPONINI <0.30 <0.30 <0.30   Radiology Studies Imaging results have been reviewed and Nm Myocar Multi W/spect W/wall Motion / Ef  11/29/2013   CLINICAL DATA:  Chest Pain  EXAM: MYOCARDIAL IMAGING WITH SPECT (REST AND PHARMACOLOGIC-STRESS)  GATED LEFT VENTRICULAR WALL MOTION STUDY  LEFT VENTRICULAR EJECTION FRACTION  TECHNIQUE: Standard myocardial SPECT imaging was performed after resting intravenous injection of  10 mCi Tc-77m sestamibi. Subsequently, intravenous infusion of Lexiscan was performed under the supervision of the Cardiology staff. At peak effect of the drug, 30 mCi Tc-19m sestamibi was injected intravenously and standard myocardial SPECT imaging was performed. Quantitative gated imaging was also performed to evaluate left ventricular wall motion, and estimate left ventricular ejection fraction.  COMPARISON:  03/12/2013  FINDINGS: Perfusion: There is a large area of markedly decreased activity in the inferior wall of the left ventricular myocardium. No reversible component.  Wall Motion: Left ventricular dilatation. Decreased inferior wall motion and thickening.  Left Ventricular Ejection Fraction: 35 %  End diastolic volume 254 ml  End systolic volume 982 ml  IMPRESSION: 1. No reversible ischemia.  Fixed large inferior wall defect.  2. Left ventricular dilatation with decreased inferior wall motion.  3. Left ventricular ejection fraction 35%  4. High -risk stress test findings*.  *2012 Appropriate Use Criteria for Coronary Revascularization Focused Update: J Am Coll Cardiol. 6415;83(0):940-768. http://content.airportbarriers.com.aspx?articleid=1201161   Electronically Signed   By: Arne Cleveland M.D.   On: 11/29/2013 15:22    TELE NSR  ECG NSR, PVCs, old inferoposterior MI. ST segment depression shows slight waxing and waning severity, improved on last ECG at 10 pm  Nuclear perfusion images reviewed. Consistent with old inferior MI, no reversible ischemia  ASSESSMENT AND PLAN Negative enzymes and low risk nuclear study. No plans for coronary angio. Stop IV heparin. Wean off IV NTG. Transfer to telemetry once off iv meds.   Sanda Klein, MD, [facility] [phone] office [phone] pager 11/30/2013 12:55 PM

## 2013-11-30 NOTE — Progress Notes (Signed)
  I have seen and examined the patient, and reviewed the daily progress note by Dahlia Bailiff, MS IV and discussed the care of the patient with her. Please see my progress note from 11/29/2013 for further details regarding assessment and plan.    Signed:  Otho Bellows, MD 11/30/2013, 8:25 AM

## 2013-11-30 NOTE — Progress Notes (Signed)
Internal Medicine On-Call Attending  Date: 11/30/2013  Patient name: Charles [name] Medical record number: [id number] Date of birth: Jan 25, 1946 Age: 68 y.o. Gender: male  I saw and evaluated the patient. I discussed patient and reviewed the resident's note by Dr. Eulas Post, and I agree with the resident's findings and plans as documented in her note.

## 2013-11-30 NOTE — Progress Notes (Signed)
Subjective: Severe 10/10 chest pain radiating into his left shoulder last night with ST depression in the anterior leads. Pt started on Nitro and Heparin drips. Pain 5/10 this morning in the left chest.   Objective: Vital signs in last 24 hours: Filed Vitals:   11/30/13 0335 11/30/13 0500 11/30/13 0508 11/30/13 0810  BP: 112/71  124/67 125/69  Pulse: 72     Temp: 98 F (36.7 C)   97.7 F (36.5 C)  TempSrc: Oral   Oral  Resp: 13   19  Height:      Weight:  166 lb 10.7 oz (75.6 kg)    SpO2: 100%   100%   Weight change: -0.4 oz (-0.012 kg)  Intake/Output Summary (Last 24 hours) at 11/30/13 0826 Last data filed at 11/30/13 0700  Gross per 24 hour  Intake 1342.88 ml  Output   4050 ml  Net -2707.12 ml   Vitals reviewed. General: Lying in the bed, NAD. Very talkative today, somewhat tangential. HEENT: PERRL, EOMI Cardiac: RRR, no rubs, murmurs or gallops Pulm: CTAB, no wheezing Abd: Soft, nontender, nondistended, BS present Ext: warm and well perfused, no pedal edema present Neuro: alert and oriented X3, cranial nerves II-XII grossly intact, moves all 4 extremities w/o difficulty.   Lab Results: Basic Metabolic Panel:  Recent Labs Lab 11/29/13 0608 11/30/13 0530  NA 135* 135*  K 4.3 4.4  CL 96 95*  CO2 25 29  GLUCOSE 104* 100*  BUN 29* 34*  CREATININE 1.29 1.59*  CALCIUM 9.1 8.9   CBC:  Recent Labs Lab 11/26/13 0430  11/28/13 0311 11/30/13 0530  WBC 5.1  < > 5.5 5.5  NEUTROABS 3.0  --   --   --   HGB 8.0*  < > 8.1* 8.2*  HCT 25.7*  < > 26.7* 26.4*  MCV 80.6  < > 79.2 76.5*  PLT 247  < > 265 290  < > = values in this interval not displayed. Cardiac Enzymes:  Recent Labs Lab 11/29/13 1958 11/30/13 0230 11/30/13 0735  TROPONINI <0.30 <0.30 <0.30   BNP:  Recent Labs Lab 11/26/13 0430  PROBNP 5616.0*   Urine Drug Screen: Drugs of Abuse     Component Value Date/Time   LABOPIA NONE DETECTED 11/26/2013 1023   COCAINSCRNUR POSITIVE* 11/26/2013  1023   LABBENZ NONE DETECTED 11/26/2013 1023   AMPHETMU NONE DETECTED 11/26/2013 1023   THCU NONE DETECTED 11/26/2013 1023   LABBARB NONE DETECTED 11/26/2013 1023      Micro Results: Recent Results (from the past 240 hour(s))  MRSA PCR SCREENING     Status: None   Collection Time    11/26/13  3:51 PM      Result Value Ref Range Status   MRSA by PCR NEGATIVE  NEGATIVE Final   Comment:            The GeneXpert MRSA Assay (FDA     approved for NASAL specimens     only), is one component of a     comprehensive MRSA colonization     surveillance program. It is not     intended to diagnose MRSA     infection nor to guide or     monitor treatment for     MRSA infections.   Studies/Results: Nm Myocar Multi W/spect W/wall Motion / Ef  11/29/2013   CLINICAL DATA:  Chest Pain  EXAM: MYOCARDIAL IMAGING WITH SPECT (REST AND PHARMACOLOGIC-STRESS)  GATED LEFT VENTRICULAR WALL MOTION STUDY  LEFT VENTRICULAR EJECTION FRACTION  TECHNIQUE: Standard myocardial SPECT imaging was performed after resting intravenous injection of 10 mCi Tc-62m sestamibi. Subsequently, intravenous infusion of Lexiscan was performed under the supervision of the Cardiology staff. At peak effect of the drug, 30 mCi Tc-59m sestamibi was injected intravenously and standard myocardial SPECT imaging was performed. Quantitative gated imaging was also performed to evaluate left ventricular wall motion, and estimate left ventricular ejection fraction.  COMPARISON:  03/12/2013  FINDINGS: Perfusion: There is a large area of markedly decreased activity in the inferior wall of the left ventricular myocardium. No reversible component.  Wall Motion: Left ventricular dilatation. Decreased inferior wall motion and thickening.  Left Ventricular Ejection Fraction: 35 %  End diastolic volume 412 ml  End systolic volume 878 ml  IMPRESSION: 1. No reversible ischemia.  Fixed large inferior wall defect.  2. Left ventricular dilatation with decreased inferior  wall motion.  3. Left ventricular ejection fraction 35%  4. High -risk stress test findings*.  *2012 Appropriate Use Criteria for Coronary Revascularization Focused Update: J Am Coll Cardiol. 6767;20(9):470-962. http://content.airportbarriers.com.aspx?articleid=1201161   Electronically Signed   By: Arne Cleveland M.D.   On: 11/29/2013 15:22   Medications: I have reviewed the patient's current medications. Scheduled Meds: . amLODipine  10 mg Oral Daily  . aspirin EC  81 mg Oral Daily  . atorvastatin  20 mg Oral q1800  . citalopram  20 mg Oral Daily  . cloNIDine  0.2 mg Oral BID  . feeding supplement (ENSURE COMPLETE)  237 mL Oral BID BM  . furosemide  80 mg Intravenous BID  . gabapentin  100 mg Oral BID  . hydrALAZINE  50 mg Oral 3 times per day  . isosorbide mononitrate  60 mg Oral Daily  . pantoprazole  40 mg Oral BID  . phenytoin  200 mg Oral q morning - 10a  . phenytoin  300 mg Oral QHS  . potassium chloride  20 mEq Oral Daily   Continuous Infusions: . heparin 1,050 Units/hr (11/30/13 0700)  . nitroGLYCERIN 50 mcg/min (11/30/13 0700)   PRN Meds:.acetaminophen, ipratropium-albuterol  Assessment/Plan: 68 y.o. male w/ PMHx of HTN, HLD, dCHF, PAH, PVD, CAD s/p CABG (2009), CKD, and cocaine abuse was admitted for chest pain in the setting of uncontrolled HTN, recent cocaine use, and volume overload.  Chest Pain with new ST depression: Patient w/ known CAD s/p CABG (2009 @ [place]; LIMA to LAD + saphenous vein to PDA and 1st/2nd OM) and recent cocaine abuse, admitted for severe, left-sided sharp chest pain. Etiology likely 2/2 volume overload, cocaine use, and uncontrolled HTN. Pt on ASA, Imdur, and Hydralazine. Lexiscan from 9/12 w/ large inferior wall defect w/o reversible ischemia, EF 35%. Overnight pt with 10/10 chest pain and ST depression in V2-4.  Pt started on Nitro and Heparin drips and transferred to SDU. Chest pain improved to 5/10 this morning. Troponin negative  x3. Will have to see how Cardiology wants to proceed.  - Nitro drip, titrate for chest pain improvement - Heparin drip - Continue ASA  - Amlodipine to 10mg  daily - Imdur 60mg  qd  - Continue Hydralazine - Continue Zocor 40 mg qhs  - Tylenol 650mg  PRN for pain  - F/u with Cardiology, appreciate recommendations   Acute on Chronic dCHF: Most recent ECHO from 07/16/13 shows EF of 50-55% w/ possible mild basal/midinferolateral hypokinesis and grade 2 diastolic dysfunction. On Lasix 80 mg qd at home. Given 80 mg IV in ED and started on 80 mg  IV bid for now. HF team concerned that the pt is taking in too much liquid. Net -8L. Continuing diuresis per Cardiology, possibly switch to po Lasix today. -Continue Lasix per Cards -Imdur as above + Hydralazine 25 mg q8h for decreased afterload.  -Daily weights, strict I/O's   HTN: Likely non-compliant w/ home BP meds, presented w/ hypertensive urgency. Currently on Nitro drip for chest pain. - Norvasc 10mg  qd  - Continue Clonidine 0.2 mg bid  - Imdur and Lasix as above  - Continue Hydralazine 25 mg q8h prn   Normocytic anemia: H/o significant anemia w/ iron deficiency, also w/ AVM's on EGD. Patient likely has occult GI losses. Hb 8.2 this AM, near baseline.  -Will need outpatient GI follow up  -Transfuse Hb <7    Dispo: Disposition is deferred at this time, awaiting improvement of current medical problems.  Anticipated discharge in approximately 2-3 day(s).   The patient does have a current PCP [name]) and does not need an [facility] hospital follow-up appointment after discharge.  The patient does not have transportation limitations that hinder transportation to clinic appointments.  .Services Needed at time of discharge: Y = Yes, Blank = No PT:   OT:   RN:   Equipment:   Other:     LOS: 4 days   Otho Bellows, MD 11/30/2013, 8:26 AM

## 2013-11-30 NOTE — Progress Notes (Signed)
EKG obtained for MD. Once obtained and assessed from previous EKG appeared to have no changes from prior EKG. Paged MD to notify it was completed. No new orders placed. Will continue to assess and monitor pt closely.

## 2013-12-01 DIAGNOSIS — J449 Chronic obstructive pulmonary disease, unspecified: Secondary | ICD-10-CM

## 2013-12-01 DIAGNOSIS — K922 Gastrointestinal hemorrhage, unspecified: Secondary | ICD-10-CM

## 2013-12-01 DIAGNOSIS — I5043 Acute on chronic combined systolic (congestive) and diastolic (congestive) heart failure: Principal | ICD-10-CM

## 2013-12-01 DIAGNOSIS — I471 Supraventricular tachycardia: Secondary | ICD-10-CM | POA: Diagnosis not present

## 2013-12-01 DIAGNOSIS — Z91199 Patient's noncompliance with other medical treatment and regimen due to unspecified reason: Secondary | ICD-10-CM

## 2013-12-01 DIAGNOSIS — Z9119 Patient's noncompliance with other medical treatment and regimen: Secondary | ICD-10-CM

## 2013-12-01 LAB — CBC
HCT: 27.2 % — ABNORMAL LOW (ref 39.0–52.0)
Hemoglobin: 8.5 g/dL — ABNORMAL LOW (ref 13.0–17.0)
MCH: 23.9 pg — ABNORMAL LOW (ref 26.0–34.0)
MCHC: 31.3 g/dL (ref 30.0–36.0)
MCV: 76.4 fL — ABNORMAL LOW (ref 78.0–100.0)
Platelets: 305 10*3/uL (ref 150–400)
RBC: 3.56 MIL/uL — ABNORMAL LOW (ref 4.22–5.81)
RDW: 17 % — ABNORMAL HIGH (ref 11.5–15.5)
WBC: 4.7 10*3/uL (ref 4.0–10.5)

## 2013-12-01 LAB — GLUCOSE, CAPILLARY: Glucose-Capillary: 106 mg/dL — ABNORMAL HIGH (ref 70–99)

## 2013-12-01 MED ORDER — FUROSEMIDE 80 MG PO TABS
80.0000 mg | ORAL_TABLET | Freq: Every day | ORAL | Status: DC
  Administered 2013-12-01: 80 mg via ORAL
  Filled 2013-12-01: qty 1

## 2013-12-01 MED ORDER — FUROSEMIDE 80 MG PO TABS: 80.0000 mg | ORAL_TABLET | Freq: Every day | ORAL | Status: DC

## 2013-12-01 MED ORDER — CLONIDINE HCL 0.2 MG PO TABS: 0.2000 mg | ORAL_TABLET | Freq: Two times a day (BID) | ORAL | Status: DC

## 2013-12-01 MED ORDER — HYDRALAZINE HCL 50 MG PO TABS: 50.0000 mg | ORAL_TABLET | Freq: Three times a day (TID) | ORAL | Status: DC

## 2013-12-01 MED ORDER — ISOSORBIDE MONONITRATE ER 30 MG PO TB24: 30.0000 mg | ORAL_TABLET | Freq: Every day | ORAL | Status: DC

## 2013-12-01 NOTE — Progress Notes (Signed)
Charles [name] UTM:[id number] DOB: 1946-03-14 DOA: 11/26/2013 PCP: [name] Assessment/ Plan:    Unstable angina: Improved. Patient w/ known CAD s/p CABG (2009 @ [place]; LIMA to LAD + saphenous vein to PDA and 1st/2nd OM) and recent cocaine abuse, admitted for severe, left-sided sharp chest pain. Etiology likely 2/2 volume overload, cocaine use, and uncontrolled HTN. Pt on ASA, Imdur, and Hydralazine. Lexiscan from 9/12 w/ large inferior wall defect w/o reversible ischemia, EF 35%. Presented with a 10/10 chest pain and ST depression in V2-4 on the night of 11/29/2013. Pt restarted on Nitro and Heparin drips and transferred to SDU. These drips were subsequently discontinued by cardiology after nuclear perfusion image revealed low risk for acute coronary syndrome. Troponin negative x3. Today, patient reports that this chest pain has resolved.  Plan. - Chest pain is better. Patient will be discharged today - Heparin drip and Nitro drip discontinued - Continue ASA  - Amlodipine to 10mg  daily  - Imdur 60mg  qd  - Continue Hydralazine  - Cardiology recommending resumption of his home Lasix tomorrow. Due to noted increase in creatinine from 1.29  To 1.59. Patient does have underlying chronic kidney disease stage III at baseline. - Cardiology noted his high risk for readmission. We have obtained a followup appointment with his cardiologist in about 4 days. - Continue Zocor 40 mg qhs  - Tylenol 650mg  PRN for pain    Acute on Chronic dCHF: Improved. Weight is down by 6 pounds from 172 on admission to 166 pounds today. Most recent ECHO from 07/16/13 shows EF of 50-55% w/ possible mild basal/midinferolateral hypokinesis and grade 2 diastolic dysfunction. 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 Given 80 mg IV in ED and started on 80 mg IV bid subsequently.  Plan. -A.m. Lasix dose held. Cardiology  recommends resumption of his by mouth Lasix tomorrow. Due to a slight increase in his creatinine level from 1.29 to 1.59. -Imdur as above + Hydralazine 25 mg q8h for decreased afterload.  -Daily weights, strict I/O's  - Followup with cardiologist on 12/05/2013  HTN: Likely non-compliant w/ home BP meds, presented w/ hypertensive urgency. Blood pressure currently well-controlled - Norvasc 10mg  qd  - Continue Clonidine 0.2 mg bid  - Imdur and Lasix as above  - Continue Hydralazine 25 mg q8h prn - The above will be his discharge medications.   Normocytic anemia: H/o significant anemia w/ iron deficiency, also w/ AVM's on EGD. Patient likely has occult GI losses. Hb 8.2 this AM, near baseline.  -Will need outpatient GI follow up  - Will require CBC at his cardiologist office on 12/05/2013.   F/E/N: Renal diet with fluid restriction  VTE Ppx: Subcutaneous heparin  CODE STATUS: Full Family Communication: Discussed with patient about his plan of care and discharge appointments.   Disposition: Patient will be discharged today. Expressed lack of transport to home. We will consult case management to provide him with a bus pass. Unable to obtain an appointment with his PCP. Encouraged patient to call otherwise, he can followup with our internal medicine Center. This might be difficult for him due to local transportation, as he lives in [place].   The patient does have current PCP (MITCHELL, JOHN), therefore will be require OPC follow-up after discharge.   The patient does have transportation limitations that hinder transportation to clinic appointments.  .Services Needed at time of discharge: Y = Yes, Blank =  No PT:   OT:   RN:   Equipment:   Other:    Length of Stay: 5 days   Subjective/Interval Events:    Subjective:  He is feeling better. Today. No chest pain. His shortness of breath is significantly improved.  Interval Events: Creatinine increased from 1.29 to 1.59.  His  morning dose of Lasix has been held  Overall weight is down by 6 pounds  No ischemia on stress test, large fixed inferior defect consistent with old myocardial infarction    Objective:     Last BM Date: 11/30/13   Weights: 24-hour Weight change: -2 lb 6.8 oz (-1.1 kg)  Filed Weights   11/29/13 2016 11/30/13 0500 12/01/13 0334  Weight: 168 lb 14 oz (76.6 kg) 166 lb 10.7 oz (75.6 kg) 166 lb 7.2 oz (75.5 kg)    Intake/Output:   Intake/Output Summary (Last 24 hours) at 12/01/13 1339 Last data filed at 12/01/13 0857  Gross per 24 hour  Intake    360 ml  Output   1275 ml  Net   -915 ml       Physical Exam: Vital Signs:   Temp:  [97.6 F (36.4 C)-97.9 F (36.6 C)] 97.6 F (36.4 C) (09/14 0334) Pulse Rate:  [71-78] 71 (09/14 0334) Resp:  [19-30] 19 (09/14 0334) BP: (137-145)/(49-73) 138/63 mmHg (09/14 0334) SpO2:  [96 %-100 %] 100 % (09/14 0334) Weight:  [166 lb 7.2 oz (75.5 kg)] 166 lb 7.2 oz (75.5 kg) (09/14 0334) General: Vital signs reviewed. No distress.  Lungs: Clear to auscultation bilaterally  Heart: RRR; no extra sounds or murmurs  Abdomen: Bowel sounds present, soft, nontender; no hepatosplenomegaly  Extremities: No bilateral ankle edema  Neurologic: Alert and oriented x3. Moves all extremities  Labs: Basic Metabolic Panel:  Recent Labs Lab 11/26/13 0430 11/27/13 0401 11/28/13 0311 11/29/13 0608 11/30/13 0530  NA 135* 135* 137 135* 135*  K 4.3 4.5 4.1 4.3 4.4  CL 104 103 99 96 95*  CO2 16* 19 22 25 29   GLUCOSE 96 100* 116* 104* 100*  BUN 20 24* 26* 29* 34*  CREATININE 1.02 1.17 1.35 1.29 1.59*  CALCIUM 8.1* 8.3* 8.3* 9.1 8.9    CBC:  Recent Labs Lab 11/26/13 0430 11/27/13 0401 11/28/13 0311 11/30/13 0530 12/01/13 0449  WBC 5.1 5.9 5.5 5.5 4.7  NEUTROABS 3.0  --   --   --   --   HGB 8.0* 8.2* 8.1* 8.2* 8.5*  HCT 25.7* 27.3* 26.7* 26.4* 27.2*  MCV 80.6 79.8 79.2 76.5* 76.4*  PLT 247 268 265 290 305    Cardiac Enzymes:  Recent  Labs Lab 11/27/13 0400 11/29/13 1958 11/30/13 0230 11/30/13 0735 11/30/13 1215  TROPONINI <0.30 <0.30 <0.30 <0.30 <0.30     CBG:  Recent Labs Lab 12/01/13 0737  GLUCAP 106*    Coagulation Studies: No results found for this basename: LABPROT, INR,  in the last 72 hours  Microbiology: Results for orders placed during the hospital encounter of 11/26/13  MRSA PCR SCREENING     Status: None   Collection Time    11/26/13  3:51 PM      Result Value Ref Range Status   MRSA by PCR NEGATIVE  NEGATIVE Final   Comment:            The GeneXpert MRSA Assay (FDA     approved for NASAL specimens     only), is one component of a     comprehensive MRSA  colonization     surveillance program. It is not     intended to diagnose MRSA     infection nor to guide or     monitor treatment for     MRSA infections.     Imaging: No results found.    Medications:    Infusions:     Scheduled Medications: . amLODipine  10 mg Oral Daily  . aspirin EC  81 mg Oral Daily  . atorvastatin  20 mg Oral q1800  . citalopram  20 mg Oral Daily  . cloNIDine  0.2 mg Oral BID  . feeding supplement (ENSURE COMPLETE)  237 mL Oral BID BM  . furosemide  80 mg Oral Daily  . gabapentin  100 mg Oral BID  . heparin subcutaneous  5,000 Units Subcutaneous 3 times per day  . hydrALAZINE  50 mg Oral 3 times per day  . isosorbide mononitrate  30 mg Oral Daily  . pantoprazole  40 mg Oral BID  . phenytoin  200 mg Oral q morning - 10a  . phenytoin  300 mg Oral QHS  . potassium chloride  20 mEq Oral Daily     PRN Medications: acetaminophen, ipratropium-albuterol  Principal Problem:   Acute on chronic combined systolic and diastolic heart failure Active Problems:   Iron deficiency anemia   CAD- CABG x3 '09. Myoview no ischemia 11/27/13   Chronic kidney disease (CKD), stage III (moderate)   Chest pain   Convulsions/seizures   Cocaine abuse   Gastric AVM   Malignant hypertension   History of GI  bleed   ICM- new drop in EF-40-45%- 2D 11/27/13   PAT- 10bts,during Myoview 11/27/13   Signed by:  Jessee Avers, MD PGY-3, Internal Medicine  Pager [phone] 12/01/2013, 1:39 PM

## 2013-12-01 NOTE — Progress Notes (Signed)
    Subjective:  SOB mproving  Objective:  Vital Signs in the last 24 hours: Temp:  [97.6 F (36.4 C)-98.3 F (36.8 C)] 97.6 F (36.4 C) (09/14 0334) Pulse Rate:  [71-78] 71 (09/14 0334) Resp:  [18-30] 19 (09/14 0334) BP: (98-145)/(37-73) 138/63 mmHg (09/14 0334) SpO2:  [96 %-100 %] 100 % (09/14 0334) Weight:  [166 lb 7.2 oz (75.5 kg)] 166 lb 7.2 oz (75.5 kg) (09/14 0334)  Intake/Output from previous day:  Intake/Output Summary (Last 24 hours) at 12/01/13 0908 Last data filed at 12/01/13 0857  Gross per 24 hour  Intake    411 ml  Output   1675 ml  Net  -1264 ml    Physical Exam: General appearance: alert, cooperative and no distress Lungs: clear to auscultation bilaterally Heart: regular rate and rhythm Extremities: no edema   Rate: 72  Rhythm: normal sinus rhythm and premature ventricular contractions (PVC)  Lab Results:  Recent Labs  11/30/13 0530 12/01/13 0449  WBC 5.5 4.7  HGB 8.2* 8.5*  PLT 290 305    Recent Labs  11/29/13 0608 11/30/13 0530  NA 135* 135*  K 4.3 4.4  CL 96 95*  CO2 25 29  GLUCOSE 104* 100*  BUN 29* 34*  CREATININE 1.29 1.59*    Recent Labs  11/30/13 0735 11/30/13 1215  TROPONINI <0.30 <0.30   No results found for this basename: INR,  in the last 72 hours  Imaging: Imaging results have been reviewed  Cardiac Studies:  Assessment/Plan:  68 yo AA male, s/p CABG x3 in 2009, followed by Dr Earlie [facility] in [facility], with a history of ongoing drug/cocaine abuse. The pt developed SSCP and dyspnea after smoking cocaine on Sunday 11/23/13. He presented to the ER 11/26/13 with chest pain and SOB.  Enzymes negative and ECG no acute changes but CXR showed CHF and BNP was 56116. Myoview 9/10 showed scar but no ischemia. Echo 9/10 showed a new drop in his EF to 40-45%. No plans for cath. He has diuresed >9 L.    Principal Problem:   Acute on chronic combined systolic and diastolic heart failure Active Problems:   CAD- CABG x3 '09. Myoview no  ischemia 11/27/13   Chest pain   ICM- new drop in EF-40-45%- 2D 11/27/13   Chronic kidney disease (CKD), stage III (moderate)   Cocaine abuse   Malignant hypertension   Iron deficiency anemia   Convulsions/seizures   Gastric AVM   History of GI bleed   PAT- 10bts,during Myoview 11/27/13    PLAN: BUN and SCr rising. Will stop IV Lasix and change to his home dose of 80 mg daily in am. Check BMP nad BNP in am. Possible discharge tomorrow. Follow up with Dr Earlie Counts in [place].   Kerin Ransom PA-C Beeper [phone] 12/01/2013, 9:08 AM   Personally seen and examined. Agree with above. Discussed with internal medicine team. I think it is reasonable for him to be discharged with close followup with his primary physician. It goes without saying that he needs to curb his high risk activity of cocaine use. Ultimately, this will result in his death.  Avoid beta blocker. Resume his Lasix tomorrow. Creatinine is increase in her 1.59 from 1.29. He does chronic kidney disease stage III at baseline. No ischemia on stress test, large fixed inferior defect consistent with old myocardial infarction. EF on echo 40-45%. He is at high risk for readmission. Medications reviewed.  Candee Furbish, MD

## 2013-12-01 NOTE — Progress Notes (Signed)
Pt seen and examined with Dr. Alice Rieger. Please refer to resident note for details   Pt feels better today. CP resolved. No fevers/chills  Exam:  Gen: AAO*3, NAD  Cardio- RRR, normal heart sounds  Lungs- CTA b/l Abd- soft, Non tender, BS +  Ext- no LE edema   Assessment and Plan:  Pt is a 68 y/o male with CP likely secondary to cocaine abuse, accelerated HTN, acute on chronic diastolic HF   CP/angina:  - Cardio following. CP resolved - C/w clonidine, norvasc, hydralazine, imdur.  BP now better controlled  - Avoid beta blockers secondary to cocaine abuse  - consider tylenol prn for pain - c/w ASA, statin.  - no ischema on stress test- fixed defect consistent with old MI +  Acute on chronic diastolic HF:  - Now resolved - Resume home lasix dose in AM  - Strict I's and O's, daily weights  - c/w hydralazine, imdur   Chronic normocytic anemia:  - Hg at baseline. Will monitor  - Outpatient GI follow up

## 2013-12-01 NOTE — Discharge Instructions (Signed)
·   Thank you for allowing [place] to be involved in your healthcare while you were hospitalized at [facility].   Please note that there have been changes to your home medications.  --> PLEASE LOOK AT YOUR DISCHARGE MEDICATION LIST FOR DETAILS.  Please call your regular doctor and make an appointment for sometime this week  Please call your PCP if you have any questions or concerns, or any difficulty getting any of your medications.  Please return to the ER if you have worsening of your symptoms or new severe symptoms arise.   Angina Pectoris Angina pectoris is extreme discomfort in your chest, neck, or arm. Your doctor may call it just angina. It is caused by a lack of oxygen to your heart wall. It may feel like tightness or heavy pressure. It may feel like a crushing or squeezing pain. Some people say it feels like gas. It may go down your shoulders, back, and arms. Some people have symptoms other than pain. These include: Tiredness. Shortness of breath. Cold sweats. Feeling sick to your stomach (nausea). There are four types of angina: Stable angina. This type often lasts the same amount of time each time it happens. Activity, stress, or excitement can bring it on. It often gets better after taking a medicine called nitroglycerin. This goes under your tongue. Unstable angina. This type can happen when you are not active or even during sleep. It can suddenly get worse or happen more often. It may not get better after taking the special medicine. It can last up to 30 minutes. Microvascular angina. This type is more common in women. It may be more severe or last longer than other types. Prinzmetal angina. This type often happens when you are not active or in the early morning hours. HOME CARE  Only take medicines as told by your doctor. Stay active or exercise more as told by your doctor. Limit very hard activity as told by your doctor. Limit heavy lifting as told by your  doctor. Keep a healthy weight. Learn about and eat foods that are healthy for your heart. Do not use any tobacco such as cigarettes, chewing tobacco, or e-cigarettes. GET HELP RIGHT AWAY IF:  You have chest, neck, deep shoulder, or arm pain or discomfort that lasts more than a few minutes. You have chest, neck, deep shoulder, or arm pain or discomfort that goes away and comes back over and over again. You have heavy sweating that seems to happen for no reason. You have shortness of breath or trouble breathing. Your angina does not get better after a few minutes of rest. Your angina does not get better after you take nitroglycerin medicine. These can all be symptoms of a heart attack. Get help right away. Call your local emergency service (911 in [place] Do not  drive yourself to the hospital. Do not  wait to for your symptoms to go away. MAKE SURE YOU:  Understand these instructions. Will watch your condition. Will get help right away if you are not doing well or get worse. Document Released: 08/23/2007 Document Revised: 03/11/2013 Document Reviewed: 07/08/2013 [facility] Patient Information 2015 [place], [place]. This information is not intended to replace advice given to you by your health care provider. Make sure you discuss any questions you have with your health care provider.

## 2013-12-01 NOTE — Progress Notes (Signed)
CARDIAC REHAB PHASE I   PRE:  Rate/Rhythm: 71 SR  BP:  Supine:   Sitting: 124/60  Standing:    SaO2: 99 RA  MODE:  Ambulation: 760 ft   POST:  Rate/Rhythm: 76  BP:  Supine:   Sitting: 124/60  Standing:    SaO2: 95 RA 1350-1425 Assisted X 1 and used walker to ambulate. Gait steady with walker. Pt able to walk 760 feet with several standing rest stops. He c/o of pain in both hips and down his legs. Pain eases with rest. Pt to recliner after walk with call light in reach. Discussed CHF with pt. We discussed daily weights, sodium and fluid restrictions.I doubt any compliance with restrictions. Pt lives with a lady and her children and she does the cooking.  Rodney Langton RN 12/01/2013 2:24 PM

## 2013-12-01 NOTE — Progress Notes (Signed)
   LOS: 5 days   Subjective: - Still denies pain - Breathing comfortably  Objective: BP 138/63  Pulse 71  Temp(Src) 97.6 F (36.4 C) (Oral)  Resp 19  Ht 5\' 5"  (1.651 m)  Wt 75.5 kg (166 lb 7.2 oz)  BMI 27.70 kg/m2  SpO2 100%  Intake/Output Summary (Last 24 hours) at 12/01/13 1458 Last data filed at 12/01/13 0857  Gross per 24 hour  Intake    360 ml  Output   1075 ml  Net   -715 ml    Physical Exam: GEN: NAD, lying in bed ENT: MMM CV: RRR PULM: CTA B to bases ABD: soft, NT/ND, +BS EXT: No edema  Labs/Studies: I have reviewed labs and studies from last 24hrs per EMR.  Medications: I have reviewed the patient's current medications.  Assessment/Plan: Principal Problem:   Acute on chronic combined systolic and diastolic heart failure Active Problems:   Iron deficiency anemia   CAD- CABG x3 '09. Myoview no ischemia 11/27/13   Chronic kidney disease (CKD), stage III (moderate)   Chest pain   Convulsions/seizures   Cocaine abuse   Gastric AVM   Malignant hypertension   History of GI bleed   ICM- new drop in EF-40-45%- 2D 11/27/13   PAT- 10bts,during Myoview 11/27/13   Charles [name] is a 68 year old man with a history of HFpEF, CAD s/p 4vCABG 2009, anemia with h/o GI bleed, and cocaine abuse who presents with chest pain likely mixed etiology 2/2 cocaine and acutely decompensated HFpEF.   Chest pain: Stable. MPI with no evidence of reversible ischemia - c/s cardiology; appreciate recs  - imdur 30 mg today daily  - cocaine abuse cessation counselling  Acutely decompensated HFrEF: Switched from IV to PO lasix. Dry wt 75.5 kg. Cr up to 1.59 from 1.29, but has recorded baseline presumed to be 1.3-5. - c/s cardiology; appreciate recs  - strict I/O, restrict fluid intake   Uncontrolled HTN: Improved control after restarting home medication, increasing norvasc, hydralazine, and imdur  - amlodipine 10 daily  - clonidine 0.2 BID  - hydralazine 50 TID  - imdur 3 daily     CAD s/p 4vCABG 2009: Denies anginal chest pain. Troponin negative and no EKG changes.  - continue ASA 81, has h/o chronic indolent GI bleed but H/H reassuringly stable  - atorvastatin 20 daily   Normocytic Anemia: Stable. 2/2 indolent GI bleed.  - pantoprazole 40 daily for GI ppx and h/o bleeding proximal bowel AVM  - daily CBC  - type and screen   Dispo: discharge today.   This is a Careers information officer Note. The care of the patient was discussed with Dr. Alice Rieger and the assessment and plan formulated with their assistance. Please see their attached note for official documentation of the daily encounter.   I have seen the patient and reviewed the daily progress note by Dahlia Bailiff MS 4 and discussed the care of the patient with them.  Please see note for my findings, assessment, and plans/additions.   Jessee Avers, MD 5:45 PM 12/01/2013

## 2013-12-01 NOTE — Care Management Note (Addendum)
  Page 1 of 1   12/01/2013     4:51:07 PM CARE MANAGEMENT NOTE 12/01/2013  Patient:  Charles [name], Charles [name]   Account Number:  [id number]  Date Initiated:  12/01/2013  Documentation initiated by:  Mariann Laster  Subjective/Objective Assessment:   angina     Action/Plan:   CM to follow for disposition needs   Anticipated DC Date:  12/01/2013   Anticipated DC Plan:  HOME/SELF CARE         Choice offered to / List presented to:             Status of service:  Completed, signed off Medicare Important Message given?  YES (If response is "NO", the following Medicare IM given date fields will be blank) Date Medicare IM given:  12/01/2013 Medicare IM given by:  Montavius Subramaniam Date Additional Medicare IM given:   Additional Medicare IM given by:    Discharge Disposition:  HOME/SELF CARE  Per UR Regulation:  Reviewed for med. necessity/level of care/duration of stay  If discussed at [place] of Stay Meetings, dates discussed:    Comments:

## 2013-12-02 NOTE — Discharge Summary (Signed)
INTERNAL MEDICINE ATTENDING DISCHARGE COSIGN   I evaluated the patient on the day of discharge and discussed the discharge plan with my resident team. I agree with the discharge documentation and disposition.   Aoi Kouns 12/02/2013, 8:44 PM

## 2013-12-05 ENCOUNTER — Ambulatory Visit: Payer: Medicare Other | Admitting: Internal Medicine

## 2013-12-07 ENCOUNTER — Encounter (HOSPITAL_COMMUNITY): Payer: Self-pay | Admitting: Emergency Medicine

## 2013-12-07 ENCOUNTER — Observation Stay (HOSPITAL_COMMUNITY)
Admission: EM | Admit: 2013-12-07 | Discharge: 2013-12-08 | Disposition: A | Discharge status: Home / Self Care | Payer: Medicare Other | Attending: Internal Medicine | Admitting: Internal Medicine

## 2013-12-07 ENCOUNTER — Emergency Department (HOSPITAL_COMMUNITY): Payer: Medicare Other

## 2013-12-07 DIAGNOSIS — Z7982 Long term (current) use of aspirin: Secondary | ICD-10-CM | POA: Insufficient documentation

## 2013-12-07 DIAGNOSIS — Z951 Presence of aortocoronary bypass graft: Secondary | ICD-10-CM | POA: Diagnosis not present

## 2013-12-07 DIAGNOSIS — I509 Heart failure, unspecified: Secondary | ICD-10-CM

## 2013-12-07 DIAGNOSIS — I208 Other forms of angina pectoris: Secondary | ICD-10-CM | POA: Diagnosis present

## 2013-12-07 DIAGNOSIS — E785 Hyperlipidemia, unspecified: Secondary | ICD-10-CM | POA: Diagnosis not present

## 2013-12-07 DIAGNOSIS — F1910 Other psychoactive substance abuse, uncomplicated: Secondary | ICD-10-CM

## 2013-12-07 DIAGNOSIS — I2510 Atherosclerotic heart disease of native coronary artery without angina pectoris: Secondary | ICD-10-CM | POA: Diagnosis present

## 2013-12-07 DIAGNOSIS — R0602 Shortness of breath: Secondary | ICD-10-CM | POA: Diagnosis present

## 2013-12-07 DIAGNOSIS — I5043 Acute on chronic combined systolic (congestive) and diastolic (congestive) heart failure: Secondary | ICD-10-CM

## 2013-12-07 DIAGNOSIS — N183 Chronic kidney disease, stage 3 unspecified: Secondary | ICD-10-CM | POA: Diagnosis present

## 2013-12-07 DIAGNOSIS — F17200 Nicotine dependence, unspecified, uncomplicated: Secondary | ICD-10-CM | POA: Insufficient documentation

## 2013-12-07 DIAGNOSIS — I209 Angina pectoris, unspecified: Secondary | ICD-10-CM | POA: Diagnosis not present

## 2013-12-07 DIAGNOSIS — J4489 Other specified chronic obstructive pulmonary disease: Secondary | ICD-10-CM | POA: Insufficient documentation

## 2013-12-07 DIAGNOSIS — Q2733 Arteriovenous malformation of digestive system vessel: Secondary | ICD-10-CM | POA: Insufficient documentation

## 2013-12-07 DIAGNOSIS — G40909 Epilepsy, unspecified, not intractable, without status epilepticus: Secondary | ICD-10-CM | POA: Insufficient documentation

## 2013-12-07 DIAGNOSIS — R079 Chest pain, unspecified: Secondary | ICD-10-CM

## 2013-12-07 DIAGNOSIS — J449 Chronic obstructive pulmonary disease, unspecified: Secondary | ICD-10-CM | POA: Insufficient documentation

## 2013-12-07 DIAGNOSIS — M5137 Other intervertebral disc degeneration, lumbosacral region: Secondary | ICD-10-CM | POA: Diagnosis not present

## 2013-12-07 DIAGNOSIS — E119 Type 2 diabetes mellitus without complications: Secondary | ICD-10-CM | POA: Insufficient documentation

## 2013-12-07 DIAGNOSIS — I2589 Other forms of chronic ischemic heart disease: Secondary | ICD-10-CM | POA: Diagnosis not present

## 2013-12-07 DIAGNOSIS — D509 Iron deficiency anemia, unspecified: Secondary | ICD-10-CM

## 2013-12-07 DIAGNOSIS — M51379 Other intervertebral disc degeneration, lumbosacral region without mention of lumbar back pain or lower extremity pain: Secondary | ICD-10-CM | POA: Insufficient documentation

## 2013-12-07 DIAGNOSIS — I701 Atherosclerosis of renal artery: Secondary | ICD-10-CM | POA: Diagnosis not present

## 2013-12-07 DIAGNOSIS — I25119 Atherosclerotic heart disease of native coronary artery with unspecified angina pectoris: Secondary | ICD-10-CM

## 2013-12-07 DIAGNOSIS — I200 Unstable angina: Secondary | ICD-10-CM

## 2013-12-07 DIAGNOSIS — I739 Peripheral vascular disease, unspecified: Secondary | ICD-10-CM | POA: Insufficient documentation

## 2013-12-07 DIAGNOSIS — Z79899 Other long term (current) drug therapy: Secondary | ICD-10-CM | POA: Insufficient documentation

## 2013-12-07 DIAGNOSIS — G4733 Obstructive sleep apnea (adult) (pediatric): Secondary | ICD-10-CM | POA: Insufficient documentation

## 2013-12-07 DIAGNOSIS — I2089 Other forms of angina pectoris: Secondary | ICD-10-CM | POA: Diagnosis present

## 2013-12-07 DIAGNOSIS — F1410 Cocaine abuse, uncomplicated: Secondary | ICD-10-CM

## 2013-12-07 DIAGNOSIS — Z5900 Homelessness unspecified: Secondary | ICD-10-CM | POA: Insufficient documentation

## 2013-12-07 DIAGNOSIS — Z9119 Patient's noncompliance with other medical treatment and regimen: Secondary | ICD-10-CM | POA: Insufficient documentation

## 2013-12-07 DIAGNOSIS — Z91199 Patient's noncompliance with other medical treatment and regimen due to unspecified reason: Secondary | ICD-10-CM | POA: Insufficient documentation

## 2013-12-07 DIAGNOSIS — Z9114 Patient's other noncompliance with medication regimen: Secondary | ICD-10-CM

## 2013-12-07 DIAGNOSIS — I158 Other secondary hypertension: Secondary | ICD-10-CM

## 2013-12-07 DIAGNOSIS — Z91148 Patient's other noncompliance with medication regimen for other reason: Secondary | ICD-10-CM

## 2013-12-07 DIAGNOSIS — I129 Hypertensive chronic kidney disease with stage 1 through stage 4 chronic kidney disease, or unspecified chronic kidney disease: Secondary | ICD-10-CM | POA: Diagnosis not present

## 2013-12-07 DIAGNOSIS — I10 Essential (primary) hypertension: Secondary | ICD-10-CM

## 2013-12-07 HISTORY — DX: Chronic combined systolic (congestive) and diastolic (congestive) heart failure: I50.42

## 2013-12-07 LAB — I-STAT TROPONIN, ED: TROPONIN I, POC: 0.04 ng/mL (ref 0.00–0.08)

## 2013-12-07 LAB — BASIC METABOLIC PANEL
ANION GAP: 13 (ref 5–15)
BUN: 19 mg/dL (ref 6–23)
CO2: 21 mEq/L (ref 19–32)
CREATININE: 1.1 mg/dL (ref 0.50–1.35)
Calcium: 8.4 mg/dL (ref 8.4–10.5)
Chloride: 100 mEq/L (ref 96–112)
GFR calc non Af Amer: 67 mL/min — ABNORMAL LOW (ref >90)
GFR, EST AFRICAN AMERICAN: 77 mL/min — AB (ref >90)
Glucose, Bld: 88 mg/dL (ref 70–99)
Potassium: 4.8 mEq/L (ref 3.7–5.3)
SODIUM: 134 meq/L — AB (ref 137–147)

## 2013-12-07 LAB — HEPATIC FUNCTION PANEL
ALT: 24 U/L (ref 0–53)
AST: 54 U/L — AB (ref 0–37)
Albumin: 3.1 g/dL — ABNORMAL LOW (ref 3.5–5.2)
Alkaline Phosphatase: 111 U/L (ref 39–117)
BILIRUBIN TOTAL: 0.2 mg/dL — AB (ref 0.3–1.2)
Total Protein: 7.5 g/dL (ref 6.0–8.3)

## 2013-12-07 LAB — PRO B NATRIURETIC PEPTIDE: Pro B Natriuretic peptide (BNP): 1908 pg/mL — ABNORMAL HIGH (ref 0–125)

## 2013-12-07 LAB — POC OCCULT BLOOD, ED: Fecal Occult Bld: NEGATIVE

## 2013-12-07 LAB — CBC
HCT: 24.7 % — ABNORMAL LOW (ref 39.0–52.0)
HEMATOCRIT: 24.8 % — AB (ref 39.0–52.0)
Hemoglobin: 7.5 g/dL — ABNORMAL LOW (ref 13.0–17.0)
Hemoglobin: 7.5 g/dL — ABNORMAL LOW (ref 13.0–17.0)
MCH: 23.4 pg — ABNORMAL LOW (ref 26.0–34.0)
MCH: 23.4 pg — AB (ref 26.0–34.0)
MCHC: 30.4 g/dL (ref 30.0–36.0)
MCHC: 30.2 g/dL (ref 30.0–36.0)
MCV: 76.9 fL — ABNORMAL LOW (ref 78.0–100.0)
MCV: 77.3 fL — ABNORMAL LOW (ref 78.0–100.0)
PLATELETS: 394 10*3/uL (ref 150–400)
Platelets: 403 10*3/uL — ABNORMAL HIGH (ref 150–400)
RBC: 3.21 MIL/uL — ABNORMAL LOW (ref 4.22–5.81)
RBC: 3.21 MIL/uL — ABNORMAL LOW (ref 4.22–5.81)
RDW: 17.1 % — AB (ref 11.5–15.5)
RDW: 16.9 % — ABNORMAL HIGH (ref 11.5–15.5)
WBC: 5.7 10*3/uL (ref 4.0–10.5)
WBC: 4.5 10*3/uL (ref 4.0–10.5)

## 2013-12-07 LAB — RAPID URINE DRUG SCREEN, HOSP PERFORMED
Amphetamines: NOT DETECTED
Barbiturates: NOT DETECTED
Benzodiazepines: NOT DETECTED
Cocaine: POSITIVE — AB
OPIATES: NOT DETECTED
TETRAHYDROCANNABINOL: NOT DETECTED

## 2013-12-07 LAB — TROPONIN I

## 2013-12-07 LAB — GLUCOSE, CAPILLARY
Glucose-Capillary: 141 mg/dL — ABNORMAL HIGH (ref 70–99)
Glucose-Capillary: 79 mg/dL (ref 70–99)

## 2013-12-07 LAB — LIPASE, BLOOD: Lipase: 45 U/L (ref 11–59)

## 2013-12-07 LAB — TSH: TSH: 2.01 u[IU]/mL (ref 0.350–4.500)

## 2013-12-07 LAB — MRSA PCR SCREENING: MRSA by PCR: POSITIVE — AB

## 2013-12-07 MED ORDER — MORPHINE SULFATE 2 MG/ML IJ SOLN
2.0000 mg | Freq: Once | INTRAMUSCULAR | Status: AC
  Administered 2013-12-07: 2 mg via INTRAVENOUS
  Filled 2013-12-07: qty 1

## 2013-12-07 MED ORDER — GABAPENTIN 100 MG PO CAPS
100.0000 mg | ORAL_CAPSULE | Freq: Two times a day (BID) | ORAL | Status: DC
  Administered 2013-12-07 – 2013-12-08 (×3): 100 mg via ORAL
  Filled 2013-12-07 (×4): qty 1

## 2013-12-07 MED ORDER — NITROGLYCERIN 2 % TD OINT
0.5000 [in_us] | TOPICAL_OINTMENT | Freq: Once | TRANSDERMAL | Status: AC
Start: 2013-12-07 — End: 2013-12-07
  Administered 2013-12-07: 0.5 [in_us] via TOPICAL
  Filled 2013-12-07: qty 1

## 2013-12-07 MED ORDER — FUROSEMIDE 10 MG/ML IJ SOLN
80.0000 mg | Freq: Every day | INTRAMUSCULAR | Status: DC
  Administered 2013-12-07: 80 mg via INTRAVENOUS
  Filled 2013-12-07 (×2): qty 8

## 2013-12-07 MED ORDER — ISOSORBIDE MONONITRATE ER 30 MG PO TB24
30.0000 mg | ORAL_TABLET | Freq: Every day | ORAL | Status: DC
  Administered 2013-12-07 – 2013-12-08 (×2): 30 mg via ORAL
  Filled 2013-12-07 (×2): qty 1

## 2013-12-07 MED ORDER — ASPIRIN EC 81 MG PO TBEC
81.0000 mg | DELAYED_RELEASE_TABLET | Freq: Every day | ORAL | Status: DC
  Administered 2013-12-07 – 2013-12-08 (×2): 81 mg via ORAL
  Filled 2013-12-07 (×2): qty 1

## 2013-12-07 MED ORDER — IPRATROPIUM BROMIDE 0.02 % IN SOLN
0.5000 mg | Freq: Once | RESPIRATORY_TRACT | Status: AC
  Administered 2013-12-07: 0.5 mg via RESPIRATORY_TRACT
  Filled 2013-12-07: qty 2.5

## 2013-12-07 MED ORDER — PANTOPRAZOLE SODIUM 40 MG PO TBEC
40.0000 mg | DELAYED_RELEASE_TABLET | Freq: Two times a day (BID) | ORAL | Status: DC
  Administered 2013-12-07 – 2013-12-08 (×2): 40 mg via ORAL
  Filled 2013-12-07 (×2): qty 1

## 2013-12-07 MED ORDER — CHLORHEXIDINE GLUCONATE CLOTH 2 % EX PADS
6.0000 | MEDICATED_PAD | Freq: Every day | CUTANEOUS | Status: DC
  Administered 2013-12-08: 6 via TOPICAL

## 2013-12-07 MED ORDER — PHENYTOIN SODIUM EXTENDED 100 MG PO CAPS
200.0000 mg | ORAL_CAPSULE | Freq: Every morning | ORAL | Status: DC
  Administered 2013-12-08: 200 mg via ORAL
  Filled 2013-12-07: qty 2

## 2013-12-07 MED ORDER — FERROUS SULFATE 325 (65 FE) MG PO TABS
325.0000 mg | ORAL_TABLET | Freq: Three times a day (TID) | ORAL | Status: DC
Start: 2013-12-07 — End: 2013-12-08
  Administered 2013-12-07 – 2013-12-08 (×4): 325 mg via ORAL
  Filled 2013-12-07 (×5): qty 1

## 2013-12-07 MED ORDER — ALBUTEROL (5 MG/ML) CONTINUOUS INHALATION SOLN
10.0000 mg/h | INHALATION_SOLUTION | Freq: Once | RESPIRATORY_TRACT | Status: AC
  Administered 2013-12-07: 10 mg/h via RESPIRATORY_TRACT
  Filled 2013-12-07: qty 20

## 2013-12-07 MED ORDER — FUROSEMIDE 10 MG/ML IJ SOLN
20.0000 mg | Freq: Once | INTRAMUSCULAR | Status: AC
  Administered 2013-12-07: 20 mg via INTRAVENOUS
  Filled 2013-12-07: qty 2

## 2013-12-07 MED ORDER — CITALOPRAM HYDROBROMIDE 20 MG PO TABS
20.0000 mg | ORAL_TABLET | Freq: Every day | ORAL | Status: DC
  Administered 2013-12-07 – 2013-12-08 (×2): 20 mg via ORAL
  Filled 2013-12-07 (×2): qty 1

## 2013-12-07 MED ORDER — DOCUSATE SODIUM 100 MG PO CAPS
100.0000 mg | ORAL_CAPSULE | Freq: Two times a day (BID) | ORAL | Status: DC
  Administered 2013-12-07 – 2013-12-08 (×3): 100 mg via ORAL
  Filled 2013-12-07 (×4): qty 1

## 2013-12-07 MED ORDER — SODIUM CHLORIDE 0.9 % IJ SOLN: 3.0000 mL | INTRAMUSCULAR | Status: DC | PRN

## 2013-12-07 MED ORDER — PHENYTOIN SODIUM EXTENDED 100 MG PO CAPS: 200.0000 mg | ORAL_CAPSULE | Freq: Two times a day (BID) | ORAL | Status: DC

## 2013-12-07 MED ORDER — SODIUM CHLORIDE 0.9 % IV SOLN
250.0000 mL | INTRAVENOUS | Status: DC | PRN
Start: 2013-12-07 — End: 2013-12-08

## 2013-12-07 MED ORDER — CLONIDINE HCL 0.2 MG PO TABS
0.2000 mg | ORAL_TABLET | Freq: Two times a day (BID) | ORAL | Status: DC
  Administered 2013-12-07 – 2013-12-08 (×3): 0.2 mg via ORAL
  Filled 2013-12-07 (×4): qty 1

## 2013-12-07 MED ORDER — AMLODIPINE BESYLATE 10 MG PO TABS
10.0000 mg | ORAL_TABLET | Freq: Every day | ORAL | Status: DC
Start: 2013-12-07 — End: 2013-12-08
  Administered 2013-12-07 – 2013-12-08 (×2): 10 mg via ORAL
  Filled 2013-12-07 (×2): qty 1

## 2013-12-07 MED ORDER — SIMVASTATIN 40 MG PO TABS
40.0000 mg | ORAL_TABLET | Freq: Every evening | ORAL | Status: DC
  Administered 2013-12-07: 40 mg via ORAL
  Filled 2013-12-07 (×2): qty 1

## 2013-12-07 MED ORDER — MUPIROCIN 2 % EX OINT
1.0000 "application " | TOPICAL_OINTMENT | Freq: Two times a day (BID) | CUTANEOUS | Status: DC
  Administered 2013-12-07 – 2013-12-08 (×2): 1 via NASAL
  Filled 2013-12-07: qty 22

## 2013-12-07 MED ORDER — FUROSEMIDE 10 MG/ML IJ SOLN
40.0000 mg | Freq: Once | INTRAMUSCULAR | Status: AC
  Administered 2013-12-07: 40 mg via INTRAVENOUS
  Filled 2013-12-07: qty 4

## 2013-12-07 MED ORDER — SODIUM CHLORIDE 0.9 % IJ SOLN
3.0000 mL | Freq: Two times a day (BID) | INTRAMUSCULAR | Status: DC
  Administered 2013-12-07 – 2013-12-08 (×3): 3 mL via INTRAVENOUS

## 2013-12-07 MED ORDER — ACETAMINOPHEN 325 MG PO TABS
650.0000 mg | ORAL_TABLET | ORAL | Status: DC | PRN
  Administered 2013-12-08: 650 mg via ORAL
  Filled 2013-12-07 (×2): qty 2

## 2013-12-07 MED ORDER — ONDANSETRON HCL 4 MG/2ML IJ SOLN
4.0000 mg | Freq: Four times a day (QID) | INTRAMUSCULAR | Status: DC | PRN
Start: 2013-12-07 — End: 2013-12-08

## 2013-12-07 MED ORDER — INSULIN ASPART 100 UNIT/ML ~~LOC~~ SOLN
0.0000 [IU] | Freq: Three times a day (TID) | SUBCUTANEOUS | Status: DC
  Administered 2013-12-07: 1 [IU] via SUBCUTANEOUS

## 2013-12-07 MED ORDER — PHENYTOIN SODIUM EXTENDED 100 MG PO CAPS
300.0000 mg | ORAL_CAPSULE | Freq: Every evening | ORAL | Status: DC
  Administered 2013-12-07: 300 mg via ORAL
  Filled 2013-12-07 (×2): qty 3

## 2013-12-07 MED ORDER — HYDRALAZINE HCL 50 MG PO TABS
50.0000 mg | ORAL_TABLET | Freq: Three times a day (TID) | ORAL | Status: DC
  Administered 2013-12-07 – 2013-12-08 (×4): 50 mg via ORAL
  Filled 2013-12-07 (×5): qty 1

## 2013-12-07 NOTE — ED Notes (Signed)
Admitting at bedside 

## 2013-12-07 NOTE — H&P (Signed)
Date: 12/07/2013               Patient Name:  Charles [name] MRN: [id number]  DOB: March 29, 1945 Age / Sex: 68 y.o., male   PCP: [name]         Medical Service: Internal Medicine Teaching Service         Attending Physician: Dr. Aldine Contes, MD    First Contact: Dahlia Bailiff Pager: [phone]  Second Contact: Dr. Clayburn Pert Pager: [phone]       After Hours (After 5p/  First Contact Pager: [phone]  weekends / holidays): Second Contact Pager: [phone]   Chief Complaint: Chest pain and SOB  History of Present Illness:  68yo M w/ PMH HFpEF, CAD s/p 3V CABG '09, CKD, DM2, anemia with h/o GI bleed, and cocaine abuse who presents with chest pain and SOB.   He awoke overnight with acute onset chest pain and SOB. His pain is left sided and stabbing. The SOB is associated with a productive cough with clear sputum. He has not been taking his medications as prescribed and is taking 40mg  instead of the 80mg  of Lasix that he should be taking. However, he endorsed to the ED provider that he had not taken his medications since Thursday. He also endorses recent cocaine use this past week.   He was just discharged from our service on 9/12 for hypertensive urgency and volume overload in the setting of medication non-compliance and cocaine use. He EF was found to be decreased from April, now 40-45% from normal. Lexiscan 11/29/13 w/ large inferior wall defect w/o reversible ischemia, consistent with old infarct, EF 35%. Cocaine cessation was discussed in depth with the patient during this hospital stay and every other admission that he has had as well as medication compliance. Follow up appointments were set up for this patient with Cardiology, Dr. Wyline Copas, which he missed.   Pt denies fever, abdominal pain, constipation, diarrhea, melena, hematochezia, or changes in urination.  Meds: No current facility-administered medications for this encounter.   Current Outpatient Prescriptions    Medication Sig Dispense Refill  . amLODipine (NORVASC) 10 MG tablet Take 10 mg by mouth daily.      [name] aspirin EC 81 MG tablet Take 81 mg by mouth daily.      . citalopram (CELEXA) 20 MG tablet Take 20 mg by mouth daily.      . cloNIDine (CATAPRES) 0.2 MG tablet Take 0.2 mg by mouth 2 (two) times daily.      [name] docusate sodium (COLACE) 100 MG capsule Take 100 mg by mouth 2 (two) times daily.      . ferrous sulfate 325 (65 FE) MG tablet Take 325 mg by mouth 3 (three) times daily.      . furosemide (LASIX) 80 MG tablet Take 80 mg by mouth daily.      [name] gabapentin (NEURONTIN) 100 MG capsule Take 100 mg by mouth 2 (two) times daily.      . hydrALAZINE (APRESOLINE) 50 MG tablet Take 50 mg by mouth 3 (three) times daily.      . isosorbide mononitrate (IMDUR) 30 MG 24 hr tablet Take 30 mg by mouth daily.      . pantoprazole (PROTONIX) 40 MG tablet Take 40 mg by mouth 2 (two) times daily.      . phenytoin (DILANTIN) 100 MG ER capsule Take 200-300 mg by mouth 2 (two) times daily. 200mg  in the morning, 300mg  in the evening      .  simvastatin (ZOCOR) 40 MG tablet Take 40 mg by mouth every evening.        Allergies: Allergies as of 12/07/2013 - Review Complete 12/07/2013  Allergen Reaction Noted  . Motrin [ibuprofen] Other (See Comments) 05/16/2010   Past Medical History  Diagnosis Date  . Iron deficiency anemia   . Chronic diastolic heart failure     a. 2D ECHO 2015 50-55% and G2DD  . Hyperlipidemia LDL goal <70   . Obstructive sleep apnea     a. not on home CPAP  . Ischemic cardiomyopathy     a. EF now normalized (2012 EF 35-40%)  . Homelessness   . 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 Polysubstance abuse     a. cocaine, THC  . AV malformation of gastrointestinal tract   . Family history of early CAD   . DDD (degenerative disc disease), lumbar   . Peripheral vascular disease   . Seizure disorder   . Seizures   .  Hypertension   . CAD (coronary artery disease)     a. s/p 3-vessel CABG (12/2007) // 100% RCA stenosis with collaterals from left system. Severe bifurcation lesions of proxima CXA and OM. Moderate LAD diseaseFollow[facility] Dr[facility]  . Diabetes   . Chronic kidney disease (CKD), stage III (moderate)     BL SCr 1.5-1.6  . Renal artery stenosis    Past Surgical History  Procedure Laterality Date  . Apc  03/2010    To treat small bowel AVMs  . Esophagogastroduodenoscopy N/A 08/14/2012    Procedure: ESOPHAGOGASTRODUODENOSCOPY (EGD);  Surgeon: [name], MD;  Location[facility] END[facility]COPY;  Service: Endoscopy;  Laterality: N/A;  . Hot hemostasis N/A 08/14/2012    Procedure: HOT HEMOSTASIS (ARGON PLASMA COAGULATION/BICAP);  Surgeon: [name], MD;  Location: [facility] ENDOSCOPY;  Service: Endoscopy;  Laterality: N/A;  . Esophagogastroduodenoscopy (egd) with propofol N/A 08/04/2013    Procedure: ESOPHAGOGASTRODUODENOSCOPY (EGD) WITH PROPOFOL;  Surgeon: [name], MD;  Location: [facility] ENDOSCOPY;  Service: Endoscopy;  Laterality: N/A;  . Coronary artery bypass graft  12/2007   Family History  Problem Relation Age of Onset  . Heart disease Mother     unknown type  . Heart disease Father 40    died of MI at 37yo  . Heart disease Paternal Grandfather 46    died of MI  . Heart disease    . Heart disease Brother 30   History   Social History  . Marital Status: Single    Spouse Name: N/A    Number of Children: 2  . Years of Education: N/A   Occupational History  . Unemployed     previously worked in [employer]  .     Social History Main Topics  . Smoking status: Current Some Day Smoker -- 0.25 packs/day for 56 years    Types: Cigarettes  . Smokeless tobacco: Never Used  . Alcohol Use: Yes     Comment: occasionally drinks, a few times a month  . Drug Use: 1.00 per week    Special: Cocaine, Marijuana  . Sexual Activity: No   Other Topics Concern  . Not on file   Social  History Narrative   Lives in [place]. Originally from [place], has been in the [place] since 1980s                   Review  of Systems: A 12 point ROS was performed; pertinent positives and negatives were noted in the HPI   Physical Exam: Blood pressure 159/83, pulse 90, temperature 98.7 F (37.1 C), temperature source Oral, resp. rate 24, height 5\' 5"  (1.651 m), weight 165 lb (74.844 kg), SpO2 99.00%. General: NAD. Lying on his left side in bed. Cooperative on examination.  Head: Normocephalic and atraumatic.  Eyes: Vision grossly intact, pupils equal, round, and reactive to light  Mouth: Pharynx pink and moist  Neck: Supple, full ROM  Lungs: Normal respiratory effort. Crackles at the bases. Mild end expiratory wheezes.  Heart: Regular rate, regular rhythm, no murmur, no gallop, and no rub.  Abdomen: Soft, TTP in midepigastrium, normal bowel sounds, no guarding, no rebound tenderness, no organomegaly.  Msk: No joint swelling, warmth, or erythema.  Extremities: 2+ radial pulses bilaterally. No edema Neurologic: Alert & oriented X3, cranial nerves II-XII intact, strength normal in all extremities, no focal neurological deficits, and gait not observed.  Skin: Diffusely scattered small lesions, non-purulent, in various stages of healing Psych: Memory intact for recent and remote, normally interactive, not anxious or depressed appearing.    Lab results: Basic Metabolic Panel:  Recent Labs  12/07/13 0522  NA 134*  K 4.8  CL 100  CO2 21  GLUCOSE 88  BUN 19  CREATININE 1.10  CALCIUM 8.4   Liver Function Tests:  Recent Labs  12/07/13 0543  AST 54*  ALT 24  ALKPHOS 111  BILITOT 0.2*  PROT 7.5  ALBUMIN 3.1*    Recent Labs  12/07/13 0543  LIPASE 45   CBC:  Recent Labs  12/07/13 0522  WBC 5.7  HGB 7.5*  HCT 24.7*  MCV 76.9*  PLT 394   Cardiac Enzymes: No results found for this basename: CKTOTAL, CKMB, CKMBINDEX, TROPONINI,  in the last 72  hours BNP:  Recent Labs  12/07/13 0522  PROBNP 1908.0*   D-Dimer: No results found for this basename: DDIMER,  in the last 72 hours CBG: No results found for this basename: GLUCAP,  in the last 72 hours Hemoglobin A1C: No results found for this basename: HGBA1C,  in the last 72 hours Fasting Lipid Panel: No results found for this basename: CHOL, HDL, LDLCALC, TRIG, CHOLHDL, LDLDIRECT,  in the last 72 hours Thyroid Function Tests: No results found for this basename: TSH, T4TOTAL, FREET4, T3FREE, THYROIDAB,  in the last 72 hours Anemia Panel: No results found for this basename: VITAMINB12, FOLATE, FERRITIN, TIBC, IRON, RETICCTPCT,  in the last 72 hours Coagulation: No results found for this basename: LABPROT, INR,  in the last 72 hours Urine Drug Screen: Drugs of Abuse     Component Value Date/Time   LABOPIA NONE DETECTED 11/26/2013 1023   COCAINSCRNUR POSITIVE* 11/26/2013 1023   LABBENZ NONE DETECTED 11/26/2013 1023   AMPHETMU NONE DETECTED 11/26/2013 1023   THCU NONE DETECTED 11/26/2013 1023   LABBARB NONE DETECTED 11/26/2013 1023    Alcohol Level: No results found for this basename: ETH,  in the last 72 hours Urinalysis: No results found for this basename: COLORURINE, APPERANCEUR, LABSPEC, PHURINE, GLUCOSEU, HGBUR, BILIRUBINUR, KETONESUR, PROTEINUR, UROBILINOGEN, NITRITE, LEUKOCYTESUR,  in the last 72 hours   Imaging results:  Dg Chest Port 1 View  12/07/2013   CLINICAL DATA:  Lethargy. Fatigue, shortness of breath and chest pain.  EXAM: PORTABLE CHEST - 1 VIEW  COMPARISON:  Chest radiograph performed 11/26/2013  FINDINGS: The lungs are well-aerated. Persistent vascular congestion is noted. Mildly increased interstitial markings  may reflect mild interstitial edema. There is no evidence of pleural effusion or pneumothorax.  The cardiomediastinal silhouette is mildly enlarged. The patient is status post median sternotomy, with evidence of prior CABG. No acute osseous abnormalities are  seen.  IMPRESSION: Vascular congestion and mild cardiomegaly. Mildly increased interstitial markings again noted, raising concern for mild persistent interstitial edema.   Electronically Signed   By: Garald Balding M.D.   On: 12/07/2013 05:55    Other results: EKG: Sinus tachycardia. RSR' in V1 and V2. Q waves in II, III, aVF. Prolonged QTc. All findings appear unchanged from previous EKG 11/29/13.  Assessment & Plan by Problem: 68yo M w/ PMH HFpEF, CAD s/p 3V CABG '09, CKD, DM2, anemia with h/o GI bleed, and cocaine abuse who presents with chest pain and SOB.   Unstable angina: In the setting of recent cocaine abuse and medication noncompliance, CAD, status post CABG in 8563, and diastolic heart failure. EKG unchanged, and troponin negative x1. Chest pain is sharp and left sided and did not respond to sublingual nitroglycerin but does to morphine. Chest x-ray revealed cardiomegaly with pulmonary vascular congestion and increased interstitial edema, which could be causing the pain. There was no pneumonia. Possible that his pain is from cocaine induced coronary spasms. PE is another possibility, but Wells score 1.5 points for PE > Low risk. A CTA of the chest was done approximately 1 mo ago which was negative for pulmonary embolism. Due to his history of GI bleed, he is not a candidate for anticoagulation with heparin. Cardiology has been consulted.  - Admit to step down unit with tele - F/u with Cardiology, appreciate recommendations - Trend troponins q6 x3 - Continue home BP meds - Lasix 80 IV daily - Morphine for pain management - Due to his poor medication noncompliance, history of GI bleed, this patient is unlikely to be a good candidate for dual antiplatelet therapy if he required coronary stenting.   Acute on chronic diastolic heart failure: Echocardiogram from 06/2013 revealed normal. Systolic function of 14-97%, but with a grade 2 diastolic dysfunction. Repeat ECHO 9/10 with reduced EF  40-45%, EF 35% on Lexiscan 11/29/13. Chest x-ray w/ vascular congestion. Patient is not compliant with Lasix 80 mg daily. ProBNP 1908, down from  >5000 last admission. Lasix 60mg  IV given in the ED.  - Cardiology consulted, appreciate recommendations - Lasix 80mg  IV daily - Strict I&Os - Daily weights  Hypertension: BP today is actually improved from his last admission. Elevated BP likely a result of medication noncompliance and cocaine use. At home, he is supposed to be taking amlodipine 10 mg daily, clonidine 0.2 mg twice daily, hydralazine 50mg  TID, and furosemide 80 mg daily. There is some discrepancy on when he last took his medications  - Will resume medications  - Avoiding BB in the setting of cocaine abuse.   History of GI bleed: Denies current rectal bleeding. Hb 7.5 on admission which is somewhat down from discharge Hb of 8.5 on 9/14. Baseline Hb 7-9. Has history of upper GI AVM bleed in 2012 s/p EGD revealing not actively bleeding AVM now s/p ablation. Iron studies during last hospitalization c/w iron deficiency anemia with no obvious chronic bleeding source  - Continue with iron supplementation.  - FOBT   Microcytic anemia: Baseline hemoglobin between 7-9. Recent anemia panel revealed iron level of 16, ferritin of 34, with a TIBC of 382. Folate level was normal. Hgb 7.5 today, down from around 8 last admission.  - CBC  in AM -Continue with iron supplementation as above.   Cocaine abuse: Counseled patient about cessation of cocaine use.  -Consult Education officer, museum for cessation assistance  COPD: Pt has significant smoking history with mention of COPD in chart review but denies having taken any controller or rescue medications in the past. Exam significant for wheezing which may be 2/2 decompensated heart failure, mildly hypoxic, with some new onset sputum production.  - duonebs q6h prn, monitor for tachycardia  CKD stage 2: Renal function actually looks good on admission. Unsure of his  baseline ~ 1.2. Will continue to monitor.  - BMP in AM  Dispo: Disposition is deferred at this time, awaiting improvement of current medical problems. Anticipated discharge in approximately 1-2 day(s).   The patient does not have a current PCP [name]) and does not need an [facility] hospital follow-up appointment after discharge.  The patient does not know have transportation limitations that hinder transportation to clinic appointments.  Signed: Otho Bellows, MD 12/07/2013, 10:39 AM

## 2013-12-07 NOTE — ED Notes (Signed)
Per EMS, pt began experience CP about 1.5 hours ago. Pt also has SOB and cough with thick mucus production. Pt has been without "some of his medications for a while" Pt has hx of CHF and COPD. Pt is having exertional dyspnea. Pt also experiencing nausea and vomiting. Pt given 4mg  of zofran in route with no relief.

## 2013-12-07 NOTE — Consult Note (Signed)
Patient ID: Charles [name] MRN: [id number], DOB/AGE: Oct 14, 1945   Admit date: 12/07/2013   Primary Physician: [name] Primary Cardiologist: Dr. Wyline Copas - [place]   Pt. Profile:  68 y/o male with h/o CAD s/p CABG in 2009 with multiple recent admissions related to c/p, HTN, chf all in the setting of ongoing cocaine abuse, who presented to the ED today w/ recurrent chest pain.  Problem List  Past Medical History  Diagnosis Date  . Iron deficiency anemia   . Chronic combined systolic and diastolic CHF, NYHA class 2     a. 11/2013 Echo: EF 40-45%, basl-mid inflat AK, Gr 2 DD.  [name] Hyperlipidemia LDL goal <70   . Obstructive sleep apnea     a. not on home CPAP  . Ischemic cardiomyopathy     a. 11/2013 Echo: EF 40-45%, basl-mid inflat AK, Gr 2 DD, mild AI, mod dil LA/RA, mod reduced RV fxn, PASP 69mmHg.  [name] Homelessness   . Moderate to severe pulmonary hypertension 03/2010    a. PA peak pressure of 76 mmHg (per 2D Echo 03/2010)  . Polysubstance abuse     a. cocaine, THC  . AV malformation of gastrointestinal tract     a. w/ h/o GIB.  [name] Family history of early CAD   . DDD (degenerative disc disease), lumbar   . Peripheral vascular disease   . Seizure disorder   . Seizures   . Hypertension   . CAD (coronary artery disease)     a. s/p 3-vessel CABG (12/2007) // 100% RCA stenosis with collaterals from left system. Severe bifurcation lesions of proxima CXA and OM. Moderate LAD disease - followed by Dr. Wyline Copas in [facility];  b. 11/2013 Myoview: Large fixed inf defect w/o ischemia, EF 35%.  . Diabetes   . Chronic kidney disease (CKD), stage III (moderate)     a. BL SCr 1.5-1.6  . Renal artery stenosis     Past Surgical History  Procedure Laterality Date  . Apc  03/2010    To treat small bowel AVMs  . Esophagogastroduodenoscopy N/A 08/14/2012    Procedure: ESOPHAGOGASTRODUODENOSCOPY (EGD);  Surgeon: [name], MD;  Location: [facility] ENDOSCOPY;  Service: Endoscopy;  Laterality:  N/A;  . Hot hemostasis N/A 08/14/2012    Procedure: HOT HEMOSTASIS (ARGON PLASMA COAGULATION/BICAP);  Surgeon: [name], MD;  Location: [facility] ENDOSCOPY;  Service: Endoscopy;  Laterality: N/A;  . Esophagogastroduodenoscopy (egd) with propofol N/A 08/04/2013    Procedure: ESOPHAGOGASTRODUODENOSCOPY (EGD) WITH PROPOFOL;  Surgeon: [name], MD;  Location[facility] ENDOSCOPY;  Service: Endoscopy;  Laterality: N/A;  . Coronary artery bypass graft  12/2007     Allergies  Allergies  Allergen Reactions  . Motrin [Ibuprofen] Other (See Comments)    Affects kidneys    HPI  68 y/o male with the above complex problem list.  He is s/p CABG in 2009 and has previously been followed by [facility] cardiology.  Chart indicated that he has been living in a homeless shelter in [place], however he denies this, saying that he lives with his dtr in [place]..  He has had several admissions this year, this being his third since August.  He was last admitted on 9/9 with hypertensive urgency, CHF, and chest pain.  He ruled out for MI.  His antihypertensives were titrated and he was aggressively diuresed down to ~ 167 lbs (was 175 on admission @ that time).  B/c of recurrent chest pain, a myoview was performed and  showed a large, fixed inferior defect w/o evidence of ischemia.  Echo showed LV dysfxn, with an EF of 40-45%.  He was discharged home on 9/12.  He says that he has been staying with his dtr and weighing himself daily - says his wt has been running in 'the 160's.'  He has not been having chest pain but has chronic 2 pillow orthopnea.  He reports compliance with meds but did use cocaine on Thursday and hasn't taken meds since.    This morning, he awoke at about 3:30 AM with productive cough/yellow sputum, chest pain, and dyspnea.  He eventually called EMS and was taken to the [facility] ED.  Here, ECG was non-acute.  Troponin is nl, pBNP is elevated @ 1908 (5616 on 9/9), H/H down to 7.5 and 24.7 (8.5/27.2 on 9/14).  UDS +  for cocaine.  He continues to report mild chest discomfort.  Home Medications - ? compliance  Prior to Admission medications   Medication Sig Start Date End Date Taking? Authorizing Provider  amLODipine (NORVASC) 10 MG tablet Take 10 mg by mouth daily.   Yes Historical Provider, MD  aspirin EC 81 MG tablet Take 81 mg by mouth daily.   Yes Historical Provider, MD  citalopram (CELEXA) 20 MG tablet Take 20 mg by mouth daily.   Yes Historical Provider, MD  cloNIDine (CATAPRES) 0.2 MG tablet Take 0.2 mg by mouth 2 (two) times daily.   Yes Historical Provider, MD  docusate sodium (COLACE) 100 MG capsule Take 100 mg by mouth 2 (two) times daily.   Yes Historical Provider, MD  ferrous sulfate 325 (65 FE) MG tablet Take 325 mg by mouth 3 (three) times daily.   Yes Historical Provider, MD  furosemide (LASIX) 80 MG tablet Take 80 mg by mouth daily.   Yes Historical Provider, MD  gabapentin (NEURONTIN) 100 MG capsule Take 100 mg by mouth 2 (two) times daily.   Yes Historical Provider, MD  hydrALAZINE (APRESOLINE) 50 MG tablet Take 50 mg by mouth 3 (three) times daily.   Yes Historical Provider, MD  isosorbide mononitrate (IMDUR) 30 MG 24 hr tablet Take 30 mg by mouth daily.   Yes Historical Provider, MD  pantoprazole (PROTONIX) 40 MG tablet Take 40 mg by mouth 2 (two) times daily.   Yes Historical Provider, MD  phenytoin (DILANTIN) 100 MG ER capsule Take 200-300 mg by mouth 2 (two) times daily. 200mg  in the morning, 300mg  in the evening   Yes Historical Provider, MD  simvastatin (ZOCOR) 40 MG tablet Take 40 mg by mouth every evening.   Yes Historical Provider, MD   Family History  Family History  Problem Relation Age of Onset  . Heart disease Mother     unknown type  . Heart disease Father 75    died of MI at 49yo  . Heart disease Paternal Grandfather 5    died of MI  . Heart disease    . Heart disease Brother 38   Social History  History   Social History  . Marital Status: Single     Spouse Name: N/A    Number of Children: 2  . Years of Education: N/A   Occupational History  . Unemployed     previously worked in [employer]  .     Social History Main Topics  . Smoking status: Current Some Day Smoker -- 0.25 packs/day for 56 years    Types: Cigarettes  . Smokeless tobacco: Never Used  . Alcohol Use: Yes  Comment: occasionally drinks, a few times a month  . Drug Use: 1.00 per week    Special: Cocaine, Marijuana     Comment: uses cocaine once/wk at least.  . Sexual Activity: No   Other Topics Concern  . Not on file   Social History Narrative   Lives in [place]. Originally from [place], has been in the [place] since 1980s      **11/2013 - says that he lives with dtr in [place].**                      Review of Systems General:  No chills, fever, night sweats or weight changes.  Cardiovascular:  +++ chest pain, +++ dyspnea on exertion, occas edema, +++ chronic 2 pillow orthopnea, no palpitations, paroxysmal nocturnal dyspnea. Dermatological: No rash, lesions/masses Respiratory: +++ productive cough w/ yellow sputum and dyspnea Urologic: No hematuria, dysuria Abdominal:   No nausea, vomiting, diarrhea, bright red blood per rectum, melena, or hematemesis Neurologic:  No visual changes, wkns, changes in mental status. All other systems reviewed and are otherwise negative except as noted above.  Physical Exam  Blood pressure 167/88, pulse 96, temperature 98.7 F (37.1 C), temperature source Oral, resp. rate 28, height 5\' 5"  (1.651 m), weight 165 lb (74.844 kg), SpO2 97.00%.  General: Pleasant, NAD  Falls to sleep easily yet notes 8/10 CP    Psych: Normal affect. Neuro: Alert and oriented X 3. Moves all extremities spontaneously. HEENT: Normal  Neck: Supple without bruits.  JVP ~ 10cm. Lungs:  Resp regular and unlabored, diminished @ bases with bibasilar crackles. Heart: RRR no s3, s4, or murmurs. Abdomen: Soft, non-tender,  non-distended, BS + x 4.  Extremities: No clubbing, cyanosis. No edema. DP/PT/Radials 1+ and equal bilaterally.  Labs  Troponin [facility] of Care Test)  Recent Labs  12/07/13 0540  TROPIPOC 0.04   Lab Results  Component Value Date   WBC 5.7 12/07/2013   HGB 7.5* 12/07/2013   HCT 24.7* 12/07/2013   MCV 76.9* 12/07/2013   PLT 394 12/07/2013     Recent Labs Lab 12/07/13 0522 12/07/13 0543  NA 134*  --   K 4.8  --   CL 100  --   CO2 21  --   BUN 19  --   CREATININE 1.10  --   CALCIUM 8.4  --   PROT  --  7.5  BILITOT  --  0.2*  ALKPHOS  --  111  ALT  --  24  AST  --  54*  GLUCOSE 88  --    Lab Results  Component Value Date   CHOL 88 10/15/2013   HDL 32* 10/15/2013   LDLCALC 42 10/15/2013   TRIG 69 10/15/2013   CHOLHDL 2.8 10/15/2013    Radiology/Studies  Nm Myocar Multi W/spect W/wall Motion / Ef  11/29/2013   CLINICAL DATA:  Chest Pain  EXAM: MYOCARDIAL IMAGING WITH SPECT (REST AND PHARMACOLOGIC-STRESS)  GATED LEFT VENTRICULAR WALL MOTION STUDY  LEFT VENTRICULAR EJECTION FRACTION  ml  IMPRESSION: 1. No reversible ischemia.  Fixed large inferior wall defect.  2. Left ventricular dilatation with decreased inferior wall motion.  3. Left ventricular ejection fraction 35%  4. High -risk stress test findings*.  *2012 Appropriate Use Criteria for Coronary Revascularization Focused Update: J Am Coll Cardiol. 4034;74(2):595-638. http://content.airportbarriers.com.aspx?articleid=1201161   Electronically Signed   By: Arne Cleveland M.D.   On: 11/29/2013 15:22   Dg Chest Port 1 View  12/07/2013  CLINICAL DATA:  Lethargy. Fatigue, shortness of breath and chest pain.  EXAM: PORTABLE CHEST - 1 VIEW    IMPRESSION: Vascular congestion and mild cardiomegaly. Mildly increased interstitial markings again noted, raising concern for mild persistent interstitial edema.   Electronically Signed   By: Garald Balding M.D.   On: 12/07/2013 05:55   ECG  ST, 105, antlat ST dep - not dramatically  changed from prior ECG 9/12.  ASSESSMENT AND PLAN  1.  Midsternal Chest Pain/CAD:  Pt presents with recurrent chest pain and dyspnea after using cocaine on Thursday.  He has been off of his home meds since then.  He has known CAD s/p CABG in 2009 with non-ischemic myoview last week.  He currently has no objective evidence of ischemia.  Says pain is 8/10 but then falls asleep   Agree with cycling CE.  Cont asa, nitrate (increase to 60mg  daily), ccb, statin.  No bb 2/2 cocaine usage.  No heparin in light of microcytic anemia (fob neg).  With recent non-ischemic myoview, would not pursue angiography unless he rules in.  Otherwise, he needs social work eval and counseling on cocaine usage.  2.  Acute on chronic combined syst/diast chf:  Wt was 167 @ d/c.  He says he has been weighing himself @ home but cannot provide exact weights.  He came in with cough and dyspnea and his pBNP is elevated @ 1908, but this is actually down from most of his previous values.  He does have diminished breath sounds and faint bibasilar crackles on exam.  CXR suggests mild edema.  Neck veins are not particularly elevated, belly is soft, no lower ext edema.  Would not expect him to require much diuresis - could likely switch to PO tomorrow.  Cont hydralazine and nitrate for afterload reduction.  No bb 2/2 ongoing cocaine.  ACEI was being considered during last admission in setting of nl creat - but never added.  If creat stable with diuresis, would add this admission.  3.  Cocaine Abuse:  Complete cessation advised.  He fails to see the connection between his drug use, med noncompliance, and readmissions.  Social work consult.  4.  HTN: BP's elevated.  Resume home meds - norvasc, hydral/nitrate, clonidine.  ? Compliance @ home.  5.  IDA:  H/H drifting down over past few admission.  FOB + in August but neg today.  At this level needs GI eval before D/C.  With CP would consider Tx 1 u pRBC    On PPI.  6.  HL:  Cont statin.  LDL  42.  Signed, Murray Hodgkins, NP 12/07/2013, 11:47 AM  Patient seen and examined.  Agree with findings of C Sharolyn Douglas above  I have amended note to reflect my findings Patient currently complains of 8/10 dscomfort yet falls asleep. Would resume meds.  Follow  Cycle troponins.    WOuld recomm GI eval in hosp  Patient with known CAD and now Hgb 7.5  Counselled on cocaine use.  Agree with SW    Dorris Carnes

## 2013-12-07 NOTE — ED Notes (Signed)
PA at bedside.

## 2013-12-07 NOTE — ED Provider Notes (Signed)
Medical screening examination/treatment/procedure(s) were performed by non-physician practitioner and as supervising physician I was immediately available for consultation/collaboration.    Dot Lanes, MD 12/07/13 [phone]

## 2013-12-07 NOTE — ED Notes (Signed)
Socks placed on pt.

## 2013-12-07 NOTE — ED Provider Notes (Signed)
CSN: [id number]     Arrival date & time 12/07/13  0449 History   First MD Initiated Contact with Patient 12/07/13 [phone]     Chief Complaint  Patient presents with  . Shortness of Breath  . Chest Pain   Patient is a 68 y.o. male presenting with shortness of breath and chest pain.  Shortness of Breath Associated symptoms: chest pain   Chest Pain Associated symptoms: shortness of breath     Patient is a 68 y.o. Male with past medical history of 3 vessel CABG, CHF with an EF of 50-55%, CKD, DM, and OSA who presents to the ED with chest pain and shortness of breath.  Per the patient his chest pain is left sided and sharp and stabbing in nature.  Patient states that it woke him from sleep this morning.  Patient states that he has some associated shortness of breath, coughing with white sputum production, chills, PND, orthopnea.  Patient denies fever, abdominal pain, constipation, diarrhea, melena, hematochezia, urinary complaints.  Patient does have a history of polysubstance abuse and admits to using cocaine this week.  Patient states that he has not been taking his medications since Thursday of last week.  Patient was recently admitted to the hospital on 11/26/13 and discharged on the 12th for the chest pain and hypertension by internal medicine.    Past Medical History  Diagnosis Date  . Iron deficiency anemia   . Chronic diastolic heart failure     a. 2D ECHO 2015 50-55% and G2DD  . Hyperlipidemia LDL goal <70   . Obstructive sleep apnea     a. not on home CPAP  . Ischemic cardiomyopathy     a. EF now normalized (2012 EF 35-40%)  . Homelessness   . 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 Polysubstance abuse     a. cocaine, THC  . AV malformation of gastrointestinal tract   . Family history of early CAD   . DDD (degenerative disc disease), lumbar   . Peripheral vascular disease   . Seizure disorder   . Seizures   .  Hypertension   . CAD (coronary artery disease)     a. s/p 3-vessel CABG (12/2007) // 100% RCA stenosis with collaterals from left system. Severe bifurcation lesions of proxima CXA and OM. Moderate LAD diseaseFollowed by Dr[facility]yline [facility] in [facility]  . Diabetes   . Chronic kidney disease (CKD), stage III (moderate)     BL SCr 1.5-1.6  . Renal artery stenosis    Past Surgical History  Procedure Laterality Date  . Apc  03/2010    To treat small bowel AVMs  . Esophagogastroduodenoscopy N/A 08/14/2012    Procedure: ESOPHAGOGASTRODUODENOSCOPY (EGD);  Surgeon: [name], MD;  Location: [facility] ENDOSCOPY;  Service: Endoscopy;  Laterality: N/A;  . Hot hemostasis N/A 08/14/2012    Procedure: HOT HEMOSTASIS (ARGON PLASMA COAGULATION/BICAP);  Surgeon: [name], MD;  Location: [facility] ENDOSCOPY;  Service: Endoscopy;  Laterality: N/A;  . Esophagogastroduodenoscopy (egd) with propofol N/A 08/04/2013    Procedure: ESOPHAGOGASTRODUODENOSCOPY (EGD) WITH PROPOFOL;  Surgeon: [name], MD;  Location: [facility] ENDOSCOPY;  Service: Endoscopy;  Laterality: N/A;  . Coronary artery bypass graft  12/2007   Family History  Problem Relation Age of Onset  . Heart disease Mother     unknown type  . Heart disease Father 17    died of MI at 65yo  .  Heart disease Paternal Grandfather 74    died of MI  . Heart disease    . Heart disease Brother 30   History  Substance Use Topics  . Smoking status: Current Some Day Smoker -- 0.25 packs/day for 56 years    Types: Cigarettes  . Smokeless tobacco: Never Used  . Alcohol Use: Yes     Comment: occasionally drinks, a few times a month    Review of Systems  Respiratory: Positive for shortness of breath.   Cardiovascular: Positive for chest pain.   See HPI, all other ROS are negative   Allergies  Motrin  Home Medications   Prior to Admission medications   Medication Sig Start Date End Date Taking? Authorizing Provider  amLODipine (NORVASC) 10 MG tablet Take 10 mg by  mouth daily.   Yes Historical Provider, MD  aspirin EC 81 MG tablet Take 81 mg by mouth daily.   Yes Historical Provider, MD  citalopram (CELEXA) 20 MG tablet Take 20 mg by mouth daily.   Yes Historical Provider, MD  cloNIDine (CATAPRES) 0.2 MG tablet Take 0.2 mg by mouth 2 (two) times daily.   Yes Historical Provider, MD  docusate sodium (COLACE) 100 MG capsule Take 100 mg by mouth 2 (two) times daily.   Yes Historical Provider, MD  ferrous sulfate 325 (65 FE) MG tablet Take 325 mg by mouth 3 (three) times daily.   Yes Historical Provider, MD  furosemide (LASIX) 80 MG tablet Take 80 mg by mouth daily.   Yes Historical Provider, MD  gabapentin (NEURONTIN) 100 MG capsule Take 100 mg by mouth 2 (two) times daily.   Yes Historical Provider, MD  hydrALAZINE (APRESOLINE) 50 MG tablet Take 50 mg by mouth 3 (three) times daily.   Yes Historical Provider, MD  isosorbide mononitrate (IMDUR) 30 MG 24 hr tablet Take 30 mg by mouth daily.   Yes Historical Provider, MD  pantoprazole (PROTONIX) 40 MG tablet Take 40 mg by mouth 2 (two) times daily.   Yes Historical Provider, MD  phenytoin (DILANTIN) 100 MG ER capsule Take 200-300 mg by mouth 2 (two) times daily. 200mg  in the morning, 300mg  in the evening   Yes Historical Provider, MD  simvastatin (ZOCOR) 40 MG tablet Take 40 mg by mouth every evening.   Yes Historical Provider, MD   BP 157/86  Pulse 97  Temp(Src) 98.7 F (37.1 C) (Oral)  Resp 22  Ht 5\' 5"  (1.651 m)  Wt 165 lb (74.844 kg)  BMI 27.46 kg/m2  SpO2 98% Physical Exam  Nursing note and vitals reviewed. Constitutional: He is oriented to person, place, and time. He appears well-developed and well-nourished. No distress.  HENT:  Head: Normocephalic and atraumatic.  Mouth/Throat: Oropharynx is clear and moist. No oropharyngeal exudate.  Eyes: Conjunctivae and EOM are normal. Pupils are equal, round, and reactive to light. No scleral icterus.  Neck: Normal range of motion. Neck supple. JVD  present. No thyromegaly present.  Cardiovascular: Normal rate, regular rhythm and intact distal pulses.  Exam reveals no gallop and no friction rub.   Murmur heard. Pulses:      Dorsalis pedis pulses are 1+ on the right side, and 1+ on the left side.       Posterior tibial pulses are 1+ on the right side, and 1+ on the left side.  Pulmonary/Chest: No accessory muscle usage. Tachypnea noted. He has no decreased breath sounds. He has wheezes. He has no rhonchi. He has no rales. He exhibits no  tenderness.  Abdominal: Soft. Normal appearance and bowel sounds are normal. He exhibits no distension and no mass. There is tenderness in the epigastric area. There is no rigidity, no rebound, no guarding, no CVA tenderness, no tenderness at McBurney's point and negative Murphy's sign.  Musculoskeletal: Normal range of motion.  Lymphadenopathy:    He has no cervical adenopathy.  Neurological: He is alert and oriented to person, place, and time. He has normal strength. No cranial nerve deficit or sensory deficit. Coordination normal.  Skin: Skin is warm and dry. He is not diaphoretic.  Psychiatric: He has a normal mood and affect. His behavior is normal. Judgment and thought content normal.    ED Course  Procedures (including critical care time) Labs Review Labs Reviewed  CBC - Abnormal; Notable for the following:    RBC 3.21 (*)    Hemoglobin 7.5 (*)    HCT 24.7 (*)    MCV 76.9 (*)    MCH 23.4 (*)    RDW 17.1 (*)    All other components within normal limits  BASIC METABOLIC PANEL - Abnormal; Notable for the following:    Sodium 134 (*)    GFR calc non Af Amer 67 (*)    GFR calc Af Amer 77 (*)    All other components within normal limits  PRO B NATRIURETIC PEPTIDE - Abnormal; Notable for the following:    Pro B Natriuretic peptide (BNP) 1908.0 (*)    All other components within normal limits  HEPATIC FUNCTION PANEL - Abnormal; Notable for the following:    Albumin 3.1 (*)    AST 54 (*)     Total Bilirubin 0.2 (*)    All other components within normal limits  LIPASE, BLOOD  URINE RAPID DRUG SCREEN (HOSP PERFORMED)  I-STAT TROPOININ, ED  POC OCCULT BLOOD, ED    Imaging Review Dg Chest Port 1 View  12/07/2013   CLINICAL DATA:  Lethargy. Fatigue, shortness of breath and chest pain.  EXAM: PORTABLE CHEST - 1 VIEW  COMPARISON:  Chest radiograph performed 11/26/2013  FINDINGS: The lungs are well-aerated. Persistent vascular congestion is noted. Mildly increased interstitial markings may reflect mild interstitial edema. There is no evidence of pleural effusion or pneumothorax.  The cardiomediastinal silhouette is mildly enlarged. The patient is status post median sternotomy, with evidence of prior CABG. No acute osseous abnormalities are seen.  IMPRESSION: Vascular congestion and mild cardiomegaly. Mildly increased interstitial markings again noted, raising concern for mild persistent interstitial edema.   Electronically Signed   By: Garald Balding M.D.   On: 12/07/2013 05:55     EKG Interpretation   Date/Time:  Sunday December 07 2013 05:03:56 EDT Ventricular Rate:  105 PR Interval:  152 QRS Duration: 110 QT Interval:  379 QTC Calculation: 501 R Axis:   56 Text Interpretation:  Sinus tachycardia RSR' in V1 or V2, probably normal  variant Abnormal inferior Q waves Repol abnrm suggests ischemia,  anterolateral Prolonged QT interval Abnormal ekg Confirmed by BEATON  MD,  ROBERT ([id number]) on 12/07/2013 8:54:07 AM      MDM   Final diagnoses:  Chest pain, unspecified chest pain type  Acute on chronic congestive heart failure, unspecified congestive heart failure type  Iron deficiency anemia  Essential hypertension  Polysubstance abuse  H/O medication noncompliance   Patient is a 68 y.o. Male with extensive cardiac history who presents to the ED with chest pain and shortness of breath.  Patient has been non-compliant with his medications per  report.  Physical examination  reveals wheezes in the lungs diffusely and also tachypnea with no accessory muscle usage.  Patient has 1+ bilateral pretibial edema.  Patient was treated here with 60 mg IV lasix, albuterol and atrovent continuous nebulizer, and nitroglycerin paste with little relief of his chest pain and shortness of breath.  CXR shows pulmonary edema that is interstitial in nature with no evidence of consolidation.  CBC shows worsening anemia from six days prior with Hgb at 7.5.  Hemoccult is negative.  There is mild hyponatremia on BMP.  Troponin is negative.  Will order repeat.  BNP is elevated at 1908.  Lipase is pending.  Hepatic function is pending.  I have spoken with Dr. Mare Ferrari from Cardiology who will come down to see the patient here in the ED, but he has recommended that the patient be admitted to internal medicine as he will be a bounce back patient.  I have spoken with Dr. Aundra Dubin from internal medicine who will admit the patient to Stepdown at this time.      Cherylann Parr, PA-C 12/07/13 [phone]

## 2013-12-07 NOTE — ED Notes (Signed)
MD at bedside. 

## 2013-12-07 NOTE — H&P (Signed)
Pt seen and examined with Dr. Eulas Post. Please refer to resident note for details.  In brief, Pt is a 68 y/o male with PMH of CHF, CAD s/p CABG, CKD, DM, anemia, cocaine abuse who p/w worsening SOB, CP. Pt states that he was feeling well till last night when he developed sudden onset CP, SOB. CP is left sided, sharp, non radiating assoc with SOB/DOE. Pt also noted worsening cough productive of whitish sputum. No hemoptysis. Pt has been taking only half of prescribed lasix and admits to cocaine use in the middle of the week. As per ED pt has also been non compliant with meds over the last 2-3 days. Pt was recently admitted on 9/12 for volume overload, hypertensive urgency. Had recent [place] which had no reversible ischemia.  Of note pt has missed his cardiology appt and PCP appt that were set up this week. Remaining ROS negative  Exam: Cardio: RRR, no murmurs Lungs: bibasilar crackles, scattered expiratory wheezes + Abd: soft, mild epigastric tenderness +, no rebound/guarding, BS + Ext: no edema Gen: AAO*3, NAD  Assessment and Plan: 67 y/o male with recurrent CP, SOB in the setting of medical non compliance and cocaine abuse  CP/acute on chronic HF: - Cardio to f/u. Trend troponins - c/w IV lasix 80 mg. Will consider increasing to bid if no appropriate diuresis - Strict I's and O's, daily weights - Resume home BP meds - Would attempt to avoid opiates for pain control given active cocaine abuse - avoid beta blockers given cocaine abuse  Microcytic anemia: - Hg mildly decreased today. Will recheck CBC in AM - c/w iron supplementation - no evidence of active bleeding  CKD: - Cr currently improved compared to last admission - Will monitor on diuresis  Cocaine abuse: - Pt educated on importance of cessation. Was educated repeatedly on last admission with no progress - Will get SW consult for assistance  Dr. Lynnae January to assume care in AM

## 2013-12-08 DIAGNOSIS — N182 Chronic kidney disease, stage 2 (mild): Secondary | ICD-10-CM

## 2013-12-08 DIAGNOSIS — I209 Angina pectoris, unspecified: Secondary | ICD-10-CM | POA: Diagnosis not present

## 2013-12-08 DIAGNOSIS — I25810 Atherosclerosis of coronary artery bypass graft(s) without angina pectoris: Secondary | ICD-10-CM

## 2013-12-08 DIAGNOSIS — J449 Chronic obstructive pulmonary disease, unspecified: Secondary | ICD-10-CM

## 2013-12-08 DIAGNOSIS — D509 Iron deficiency anemia, unspecified: Secondary | ICD-10-CM

## 2013-12-08 DIAGNOSIS — F1410 Cocaine abuse, uncomplicated: Secondary | ICD-10-CM

## 2013-12-08 DIAGNOSIS — I129 Hypertensive chronic kidney disease with stage 1 through stage 4 chronic kidney disease, or unspecified chronic kidney disease: Secondary | ICD-10-CM

## 2013-12-08 DIAGNOSIS — F1910 Other psychoactive substance abuse, uncomplicated: Secondary | ICD-10-CM

## 2013-12-08 DIAGNOSIS — K625 Hemorrhage of anus and rectum: Secondary | ICD-10-CM

## 2013-12-08 DIAGNOSIS — I5033 Acute on chronic diastolic (congestive) heart failure: Secondary | ICD-10-CM

## 2013-12-08 LAB — BASIC METABOLIC PANEL
ANION GAP: 12 (ref 5–15)
BUN: 23 mg/dL (ref 6–23)
CALCIUM: 8 mg/dL — AB (ref 8.4–10.5)
CO2: 24 meq/L (ref 19–32)
CREATININE: 1.43 mg/dL — AB (ref 0.50–1.35)
Chloride: 99 mEq/L (ref 96–112)
GFR calc non Af Amer: 48 mL/min — ABNORMAL LOW (ref >90)
GFR, EST AFRICAN AMERICAN: 56 mL/min — AB (ref >90)
Glucose, Bld: 97 mg/dL (ref 70–99)
Potassium: 4.5 mEq/L (ref 3.7–5.3)
Sodium: 135 mEq/L — ABNORMAL LOW (ref 137–147)

## 2013-12-08 LAB — CBC
HCT: 24.2 % — ABNORMAL LOW (ref 39.0–52.0)
Hemoglobin: 7.3 g/dL — ABNORMAL LOW (ref 13.0–17.0)
MCH: 23.3 pg — ABNORMAL LOW (ref 26.0–34.0)
MCHC: 30.2 g/dL (ref 30.0–36.0)
MCV: 77.3 fL — ABNORMAL LOW (ref 78.0–100.0)
Platelets: 424 10*3/uL — ABNORMAL HIGH (ref 150–400)
RBC: 3.13 MIL/uL — ABNORMAL LOW (ref 4.22–5.81)
RDW: 17.1 % — AB (ref 11.5–15.5)
WBC: 4.3 10*3/uL (ref 4.0–10.5)

## 2013-12-08 LAB — GLUCOSE, CAPILLARY
GLUCOSE-CAPILLARY: 87 mg/dL (ref 70–99)
Glucose-Capillary: 107 mg/dL — ABNORMAL HIGH (ref 70–99)

## 2013-12-08 LAB — HEMOGLOBIN A1C
HEMOGLOBIN A1C: 6 % — AB (ref <5.7)
MEAN PLASMA GLUCOSE: 126 mg/dL — AB (ref <117)

## 2013-12-08 LAB — TROPONIN I: Troponin I: <0.3 ng/mL (ref <0.30)

## 2013-12-08 LAB — MAGNESIUM: Magnesium: 1.8 mg/dL (ref 1.5–2.5)

## 2013-12-08 MED ORDER — FUROSEMIDE 80 MG PO TABS
80.0000 mg | ORAL_TABLET | Freq: Every day | ORAL | Status: DC
  Administered 2013-12-08: 80 mg via ORAL
  Filled 2013-12-08: qty 1
  Filled 2013-12-08: qty 2

## 2013-12-08 NOTE — Progress Notes (Signed)
Subjective: Pt states that he is feeling somewhat better this morning but still has some midsternal pain and a dry cough. No SOB.    Objective: Vital signs in last 24 hours: Filed Vitals:   12/08/13 0500 12/08/13 0708 12/08/13 0815 12/08/13 1034  BP:   139/77 143/67  Pulse:  67  73  Temp:   98.7 F (37.1 C)   TempSrc:   Oral   Resp:      Height:      Weight: 171 lb 4.8 oz (77.7 kg)     SpO2:  99% 99% 100%   Weight change: 3 lb 3.2 oz (1.452 kg)  Intake/Output Summary (Last 24 hours) at 12/08/13 1126 Last data filed at 12/07/13 2010  Gross per 24 hour  Intake    830 ml  Output    550 ml  Net    280 ml   Vitals reviewed. General: Lying in bed, NAD HEENT: PERRL, EOMI Cardiac: RRR, no rubs, murmurs or gallops Pulm: Clear to auscultation bilaterally, no wheezes, rales, or rhonchi Abd: Soft, nontender, nondistended Ext: Warm and well perfused, no pedal edema Neuro: Alert and oriented X3, cranial nerves II-XII grossly intact, moving all 4 extremities.   Lab Results: Basic Metabolic Panel:  Recent Labs Lab 12/07/13 0522 12/08/13 0101  NA 134* 135*  K 4.8 4.5  CL 100 99  CO2 21 24  GLUCOSE 88 97  BUN 19 23  CREATININE 1.10 1.43*  CALCIUM 8.4 8.0*  MG  --  1.8   Liver Function Tests:  Recent Labs Lab 12/07/13 0543  AST 54*  ALT 24  ALKPHOS 111  BILITOT 0.2*  PROT 7.5  ALBUMIN 3.1*    Recent Labs Lab 12/07/13 0543  LIPASE 45   CBC:  Recent Labs Lab 12/07/13 1335 12/08/13 0908  WBC 4.5 4.3  HGB 7.5* 7.3*  HCT 24.8* 24.2*  MCV 77.3* 77.3*  PLT 403* 424*   Cardiac Enzymes:  Recent Labs Lab 12/07/13 1335 12/07/13 1807 12/08/13 0101  TROPONINI <0.30 <0.30 <0.30   BNP:  Recent Labs Lab 12/07/13 0522  PROBNP 1908.0*   CBG:  Recent Labs Lab 12/07/13 1701 12/07/13 2130 12/08/13 0816  GLUCAP 141* 79 87   Thyroid Function Tests:  Recent Labs Lab 12/07/13 1335  TSH 2.010   Urine Drug Screen: Drugs of Abuse     Component  Value Date/Time   LABOPIA NONE DETECTED 12/07/2013 1017   COCAINSCRNUR POSITIVE* 12/07/2013 1017   LABBENZ NONE DETECTED 12/07/2013 1017   AMPHETMU NONE DETECTED 12/07/2013 1017   [place] DETECTED 12/07/2013 1017   LABBARB NONE DETECTED 12/07/2013 1017     Micro Results: Recent Results (from the past 240 hour(s))  MRSA PCR SCREENING     Status: Abnormal   Collection Time    12/07/13  2:17 PM      Result Value Ref Range Status   MRSA by PCR POSITIVE (*) NEGATIVE Final   Comment:            The GeneXpert MRSA Assay (FDA     approved for NASAL specimens     only), is one component of a     comprehensive MRSA colonization     surveillance program. It is not     intended to diagnose MRSA     infection nor to guide or     monitor treatment for     MRSA infections.     RESULT CALLED TO, READ BACK BY AND VERIFIED  WITH:     Lemmie Evens [phone] [facility] M   Studies/Results: Dg Chest Port 1 View  12/07/2013   CLINICAL DATA:  Lethargy. Fatigue, shortness of breath and chest pain.  EXAM: PORTABLE CHEST - 1 VIEW  COMPARISON:  Chest radiograph performed 11/26/2013  FINDINGS: The lungs are well-aerated. Persistent vascular congestion is noted. Mildly increased interstitial markings may reflect mild interstitial edema. There is no evidence of pleural effusion or pneumothorax.  The cardiomediastinal silhouette is mildly enlarged. The patient is status post median sternotomy, with evidence of prior CABG. No acute osseous abnormalities are seen.  IMPRESSION: Vascular congestion and mild cardiomegaly. Mildly increased interstitial markings again noted, raising concern for mild persistent interstitial edema.   Electronically Signed   By: Garald Balding M.D.   On: 12/07/2013 05:55   Medications: I have reviewed the patient's current medications. Scheduled Meds: . amLODipine  10 mg Oral Daily  . aspirin EC  81 mg Oral Daily  . Chlorhexidine Gluconate Cloth  6 each Topical Q0600  . citalopram  20  mg Oral Daily  . cloNIDine  0.2 mg Oral BID  . docusate sodium  100 mg Oral BID  . ferrous sulfate  325 mg Oral TID  . furosemide  80 mg Oral Daily  . gabapentin  100 mg Oral BID  . hydrALAZINE  50 mg Oral TID  . insulin aspart  0-9 Units Subcutaneous TID WC  . isosorbide mononitrate  30 mg Oral Daily  . mupirocin ointment  1 application Nasal BID  . pantoprazole  40 mg Oral BID  . phenytoin  200 mg Oral q morning - 10a  . phenytoin  300 mg Oral QPM  . simvastatin  40 mg Oral QPM  . sodium chloride  3 mL Intravenous Q12H   Continuous Infusions:  PRN Meds:.sodium chloride, acetaminophen, ondansetron (ZOFRAN) IV, sodium chloride  Assessment/Plan: 68yo M w/ PMH HFrEF, CAD s/p 3V CABG '09, CKD, DM2, anemia with h/o GI bleed, and cocaine abuse who presents with chest pain and SOB.   Unstable angina: In the setting of recent cocaine abuse and medication noncompliance, CAD, status post CABG in 0254, and diastolic heart failure. Chest pain was sharp and left sided and did not respond to sublingual nitroglycerin but did improve with morphine. Chest x-ray revealed cardiomegaly with pulmonary vascular congestion and increased interstitial edema w/o  PNA, which could be causing the pain. Possible that his pain is from cocaine induced coronary vasospasms. EKG unchanged and troponin negative x3. Cardiology has been consulted and recommends continuing his home medications and to follow up with his Cardiologist in [place] home Amlodipine 10mg , Clonidine 0.2 mg BID, Hydralazine 50mg  TID, Imdur 30mg  qday - Stopping IV Lasix --> resume home dose Lasix 80mg  po daily - Avoiding beta blockers given h/o cocaine abuse.    Acute on chronic diastolic heart failure: Echocardiogram from 06/2013 revealed normal. Systolic function of 27-06%, but with a grade 2 diastolic dysfunction. Repeat ECHO 9/10 with reduced EF 40-45%, EF 35% on Lexiscan 11/29/13. Chest x-ray w/ vascular congestion. Patient is not  compliant with Lasix 80 mg daily. ProBNP 1908, down from >5000 last admission. Lasix 60mg  IV given in the ED.  - Transition to po Lasix 80mg  daily, home dose - Strict I&Os  - Daily weights   Hypertension: BP today is actually improved from his last admission. Elevated BP likely a result of medication noncompliance and cocaine use. At home, he is supposed to be taking  amlodipine 10 mg daily, clonidine 0.2 mg twice daily, hydralazine 50mg  TID, and furosemide 80 mg daily. There is some discrepancy on when he last took his medications, possibly on 9/17. - Continue home medications  - Avoiding BB in the setting of cocaine abuse.   History of GI bleed: Denies current rectal bleeding. Hb 7.5 on admission which is somewhat down from discharge Hb of 8.5 on 9/14. Baseline Hb 7-9. Has history of upper GI AVM bleed in 2012 s/p EGD revealing not actively bleeding AVM now s/p ablation. Iron studies during last hospitalization c/w iron deficiency anemia with no obvious chronic bleeding source. Hgb 7.3 today, still w/in his normal range. Will have him follow up with his PCP to monitor his hemoglobin and arrange outpatient GI follow up if needed.   - Continue with iron supplementation.  - CBC later this week with PCP.   Microcytic anemia: Baseline hemoglobin between 7-9. Recent anemia panel revealed iron level of 16, ferritin of 34, with a TIBC of 382. Folate level was normal. Hgb 7.3 today, down from around 8 last admission but within his normal range 7-9. No gross blood.  - Plan as noted above  Cocaine abuse: Counseled patient about cessation of cocaine use as did Cardiology. - Social work consulted for cessation assistance   COPD: Pt has significant smoking history with mention of COPD in chart review but denies having taken any controller or rescue medications in the past. Lungs are clear on exam today. Occasional dry cough.   - duonebs q6h prn  CKD stage 2: Renal function actually looks good on admission.  Unsure of his baseline ~ 1.2. Cr increased today to 1.43 in the setting of significant diuresis.  - BMP with PCP   Dispo: D/c to home today.  The patient does have a current PCP [name]) and does not need an [facility] hospital follow-up appointment after discharge.  The patient does have transportation limitations that hinder transportation to clinic appointments.  .Services Needed at time of discharge: Y = Yes, Blank = No PT:   OT:   RN:   Equipment:   Other:     LOS: 1 day   Otho Bellows, MD 12/08/2013, 11:26 AM

## 2013-12-08 NOTE — Discharge Summary (Signed)
Name: Charles [name] MRN: [id number] DOB: 01/18/1946 68 y.o. PCP: [name]  Date of Admission: 12/07/2013  4:49 AM Date of Discharge: 12/08/2013 Attending Physician: No att. providers found  Discharge Diagnosis:   Atypical angina   Hypertension   Cocaine abuse   Acute on chronic combined systolic and diastolic heart failure   Anemia, iron deficiency   CAD- CABG x3 '09. Myoview no ischemia 11/27/13   Chronic kidney disease (CKD), stage III (moderate)     Discharge Medications:   Medication List         amLODipine 10 MG tablet  Commonly known as:  NORVASC  Take 10 mg by mouth daily.     aspirin EC 81 MG tablet  Take 81 mg by mouth daily.     citalopram 20 MG tablet  Commonly known as:  CELEXA  Take 20 mg by mouth daily.     cloNIDine 0.2 MG tablet  Commonly known as:  CATAPRES  Take 0.2 mg by mouth 2 (two) times daily.     docusate sodium 100 MG capsule  Commonly known as:  COLACE  Take 100 mg by mouth 2 (two) times daily.     ferrous sulfate 325 (65 FE) MG tablet  Take 325 mg by mouth 3 (three) times daily.     furosemide 80 MG tablet  Commonly known as:  LASIX  Take 80 mg by mouth daily.     gabapentin 100 MG capsule  Commonly known as:  NEURONTIN  Take 100 mg by mouth 2 (two) times daily.     hydrALAZINE 50 MG tablet  Commonly known as:  APRESOLINE  Take 50 mg by mouth 3 (three) times daily.     isosorbide mononitrate 30 MG 24 hr tablet  Commonly known as:  IMDUR  Take 30 mg by mouth daily.     pantoprazole 40 MG tablet  Commonly known as:  PROTONIX  Take 40 mg by mouth 2 (two) times daily.     phenytoin 100 MG ER capsule  Commonly known as:  DILANTIN  Take 200-300 mg by mouth 2 (two) times daily. 200mg  in the morning, 300mg  in the evening     simvastatin 40 MG tablet  Commonly known as:  ZOCOR  Take 40 mg by mouth every evening.        Disposition and follow-up:   Mr.Charles [name] was discharged from [facility] in Stable condition.  At the hospital follow up visit please address:  1.  - Please assess medication compliance and cessation of cocaine use.       - Also please make sure that the patient follows up with Cardiology.       - Please monitor his hemoglobin levels. He may need outpatient GI follow up.   2.  Labs / imaging needed at time of follow-up: CBC to assess anemia and BMP to assess renal function and electrolytes.   3.  Pending labs/ test needing follow-up: None  Follow-up Appointments:     Follow-up Information   Follow up with [name] On 12/12/2013. (At 1:15pm. Be sure to arrange transportation in advance- today would be a good day to call. )    Specialty:  Family Medicine   Contact information:   [address], [address] [facility] [place] [address] [phone]       Follow up with Rozann Lesches, MD On 12/15/2013. (At 1:00pm. Please be sure to call and  schedule transportation for this appointment in advance- call today)    Specialty:  Cardiology   Contact information:   [place] [place] [address] [phone]       Please follow up. (.)       Discharge Instructions: Discharge Instructions   (HEART FAILURE PATIENTS) Call MD:  Anytime you have any of the following symptoms: 1) 3 pound weight gain in 24 hours or 5 pounds in 1 week 2) shortness of breath, with or without a dry hacking cough 3) swelling in the hands, feet or stomach 4) if you have to sleep on extra pillows at night in order to breathe.    Complete by:  As directed      Activity as tolerated - No restrictions    Complete by:  As directed      Call MD for:  extreme fatigue    Complete by:  As directed      Call MD for:  persistant dizziness or light-headedness    Complete by:  As directed      Diet - low sodium heart healthy    Complete by:  As directed      Discharge instructions    Complete by:  As directed   Be sure to take all of your  medications as prescribed.  STOP using cocaine!   Be sure to go to your scheduled appointments! You will need to set up transportation for the appointments. Please call today to set this up.           Consultations:    Procedures Performed:  Nm Myocar Multi W/spect W/wall Motion / Ef  11/29/2013   CLINICAL DATA:  Chest Pain  EXAM: MYOCARDIAL IMAGING WITH SPECT (REST AND PHARMACOLOGIC-STRESS)  GATED LEFT VENTRICULAR WALL MOTION STUDY  LEFT VENTRICULAR EJECTION FRACTION  TECHNIQUE: Standard myocardial SPECT imaging was performed after resting intravenous injection of 10 mCi Tc-73m sestamibi. Subsequently, intravenous infusion of Lexiscan was performed under the supervision of the Cardiology staff. At peak effect of the drug, 30 mCi Tc-59m sestamibi was injected intravenously and standard myocardial SPECT imaging was performed. Quantitative gated imaging was also performed to evaluate left ventricular wall motion, and estimate left ventricular ejection fraction.  COMPARISON:  03/12/2013  FINDINGS: Perfusion: There is a large area of markedly decreased activity in the inferior wall of the left ventricular myocardium. No reversible component.  Wall Motion: Left ventricular dilatation. Decreased inferior wall motion and thickening.  Left Ventricular Ejection Fraction: 35 %  End diastolic volume 614 ml  End systolic volume 431 ml  IMPRESSION: 1. No reversible ischemia.  Fixed large inferior wall defect.  2. Left ventricular dilatation with decreased inferior wall motion.  3. Left ventricular ejection fraction 35%  4. High -risk stress test findings*.  *2012 Appropriate Use Criteria for Coronary Revascularization Focused Update: J Am Coll Cardiol. 5400;86(7):619-509. http://content.airportbarriers.com.aspx?articleid=1201161   Electronically Signed   By: Arne Cleveland M.D.   On: 11/29/2013 15:22   Dg Chest Port 1 View  12/07/2013   CLINICAL DATA:  Lethargy. Fatigue, shortness of breath and chest pain.   EXAM: PORTABLE CHEST - 1 VIEW  COMPARISON:  Chest radiograph performed 11/26/2013  FINDINGS: The lungs are well-aerated. Persistent vascular congestion is noted. Mildly increased interstitial markings may reflect mild interstitial edema. There is no evidence of pleural effusion or pneumothorax.  The cardiomediastinal silhouette is mildly enlarged. The patient is status post median sternotomy, with evidence of prior CABG. No acute osseous abnormalities  are seen.  IMPRESSION: Vascular congestion and mild cardiomegaly. Mildly increased interstitial markings again noted, raising concern for mild persistent interstitial edema.   Electronically Signed   By: Garald Balding M.D.   On: 12/07/2013 05:55   Dg Chest Portable 1 View  11/26/2013   CLINICAL DATA:  Chest pain  EXAM: PORTABLE CHEST - 1 VIEW  COMPARISON:  10/23/2013  FINDINGS: Postoperative changes in the mediastinum. Shallow inspiration. Heart size is enlarged and there is pulmonary vascular congestion centrally. Suggestion of mild interstitial edema in the lung bases. No focal consolidation. No pneumothorax. No blunting of costophrenic angles.  IMPRESSION: Cardiac enlargement with central pulmonary vascular congestion and mild interstitial edema.   Electronically Signed   By: Lucienne Capers M.D.   On: 11/26/2013 04:28    Admission History of Present Illness:  68yo M w/ PMH HFrEF, CAD s/p 3V CABG '09, CKD, DM2, anemia with h/o GI bleed, and cocaine abuse who presents with chest pain and SOB.  He awoke overnight with acute onset chest pain and SOB. His pain is left sided and stabbing. The SOB is associated with a productive cough with clear sputum. He has not been taking his medications as prescribed and is taking 40mg  instead of the 80mg  of Lasix that he should be taking. However, he endorsed to the ED provider that he had not taken his medications since Thursday. He also endorses recent cocaine use this past week.  He was just discharged from our service  on 9/12 for hypertensive urgency and volume overload in the setting of medication non-compliance and cocaine use. He EF was found to be decreased from April, now 40-45% from normal. Lexiscan 11/29/13 w/ large inferior wall defect w/o reversible ischemia, consistent with old infarct, EF 35%. Cocaine cessation was discussed in depth with the patient during this hospital stay and every other admission that he has had as well as medication compliance. Follow up appointments were set up for this patient with Cardiology, Dr. Wyline Copas, which he missed.  Pt denies fever, abdominal pain, constipation, diarrhea, melena, hematochezia, or changes in urination. Review of Systems:  A 12 point ROS was performed; pertinent positives and negatives were noted in the HPI  Physical Exam:  Blood pressure 159/83, pulse 90, temperature 98.7 F (37.1 C), temperature source Oral, resp. rate 24, height 5\' 5"  (1.651 m), weight 165 lb (74.844 kg), SpO2 99.00%.  General: NAD. Lying on his left side in bed. Cooperative on examination.  Head: Normocephalic and atraumatic.  Eyes: Vision grossly intact, pupils equal, round, and reactive to light  Mouth: Pharynx pink and moist  Neck: Supple, full ROM  Lungs: Normal respiratory effort. Crackles at the bases. Mild end expiratory wheezes.  Heart: Regular rate, regular rhythm, no murmur, no gallop, and no rub.  Abdomen: Soft, TTP in midepigastrium, normal bowel sounds, no guarding, no rebound tenderness, no organomegaly.  Msk: No joint swelling, warmth, or erythema.  Extremities: 2+ radial pulses bilaterally. No edema Neurologic: Alert & oriented X3, cranial nerves II-XII intact, strength normal in all extremities, no focal neurological deficits, and gait not observed.  Skin: Diffusely scattered small lesions, non-purulent, in various stages of healing Psych: Memory intact for recent and remote, normally interactive, not anxious or depressed appearing.    Hospital Course by problem  list: 68yo M w/ PMH HFrEF, CAD s/p 3V CABG '09, CKD, DM2, anemia with h/o GI bleed, and cocaine abuse who presents with chest pain and SOB.   Atypical angina: In the setting of  recent cocaine abuse and medication noncompliance, CAD, status post CABG in 7026, and diastolic heart failure. Chest pain was sharp and left sided and did not respond to sublingual nitroglycerin but did improve with morphine. Chest x-ray revealed cardiomegaly with pulmonary vascular congestion and increased interstitial edema w/o  PNA, which could be causing the pain. Possible that his pain is from cocaine induced coronary vasospasms. EKG unchanged and troponin negative x3. Cardiology has been consulted and recommends continuing his home medications and to follow up with his Cardiologist in [facility].  - Continue home Amlodipine 10mg , Clonidine 0.2 mg BID, Hydralazine 50mg  TID, Imdur 30mg  qday, and Lasix 80mg  po daily  - Avoiding beta blockers given h/o cocaine abuse.  - F/u with Cardiologist, Dr. Wyline Copas.  Acute on chronic diastolic heart failure: Echocardiogram from 06/2013 revealed normal. Systolic function of 37-85%, but with a grade 2 diastolic dysfunction. Repeat ECHO 9/10 with reduced EF 40-45%, EF 35% on Lexiscan 11/29/13. Chest x-ray w/ vascular congestion. Patient is not compliant with Lasix 80 mg daily. ProBNP 1908, down from >5000 last admission. Lasix 60mg  IV given in the ED and 80 IV overnight. Transition to home po dose 80mg  daily prior to d/c home.  - Continue Lasix 80mg  daily, home dose  - F/u with outpatient Cardiologist  Hypertension: Elevated BP on admission likely a result of medication noncompliance and cocaine use. At home, he is supposed to be taking amlodipine 10 mg daily, clonidine 0.2 mg twice daily, hydralazine 50mg  TID, and furosemide 80 mg daily. There is some discrepancy on when he last took his medications, possibly on 9/17. BP today is actually improved from his last admission.  - Continue home  medications as noted above.  - Avoiding BB in the setting of cocaine abuse.   History of GI bleed: Denies current rectal bleeding. Hb 7.5 on admission which is somewhat down from discharge Hb of 8.5 on 9/14. Baseline Hb 7-9. Has history of upper GI AVM bleed in 2012 s/p EGD revealing not actively bleeding AVM now s/p ablation. Iron studies during last hospitalization c/w iron deficiency anemia with no obvious chronic bleeding source. Hgb 7.3 today, which is still w/in his normal range. Will have him follow up with his PCP to monitor his hemoglobin and arrange outpatient GI follow up if needed.  - Continue with iron supplementation.  - CBC later this week with PCP.   Microcytic anemia: Baseline hemoglobin between 7-9. Recent anemia panel revealed iron level of 16, ferritin of 34, with a TIBC of 382. Folate level was normal. Hgb 7.3 today, down from around 8 last admission but within his normal range 7-9. No gross blood.  - Plan as noted above   Cocaine abuse: Counseled patient about cessation of cocaine use as did Cardiology.  - Social work consulted for cessation assistance   COPD: Stable. Pt has significant smoking history with mention of COPD in chart review but denies having taken any controller or rescue medications in the past. Lungs are clear on exam on day of discharge. Occasional dry cough.  - Consider Pulmonology f/u as an outpatient  CKD stage 2: Renal function good on admission with Cr 1.1. Unsure of his baseline ~ 1.2. Cr Cr i increased today to 1.43 in the setting of diuresis.  - BMP with PCP to monitor     Discharge Vitals:   BP 115/59  Pulse 73  Temp(Src) 98.1 F (36.7 C) (Oral)  Resp 21  Ht 5\' 5"  (1.651 m)  Wt 171  lb 4.8 oz (77.7 kg)  BMI 28.51 kg/m2  SpO2 100%  Discharge Labs:  Results for orders placed during the hospital encounter of 12/07/13 (from the past 24 hour(s))  GLUCOSE, CAPILLARY     Status: None   Collection Time    12/07/13  9:30 PM      Result Value  Ref Range   Glucose-Capillary 79  70 - 99 mg/dL  TROPONIN I     Status: None   Collection Time    12/08/13  1:01 AM      Result Value Ref Range   Troponin I <0.30  <0.30 ng/mL  BASIC METABOLIC PANEL     Status: Abnormal   Collection Time    12/08/13  1:01 AM      Result Value Ref Range   Sodium 135 (*) 137 - 147 mEq/L   Potassium 4.5  3.7 - 5.3 mEq/L   Chloride 99  96 - 112 mEq/L   CO2 24  19 - 32 mEq/L   Glucose, Bld 97  70 - 99 mg/dL   BUN 23  6 - 23 mg/dL   Creatinine, Ser 1.43 (*) 0.50 - 1.35 mg/dL   Calcium 8.0 (*) 8.4 - 10.5 mg/dL   GFR calc non Af Amer 48 (*) >90 mL/min   GFR calc Af Amer 56 (*) >90 mL/min   Anion gap 12  5 - 15  MAGNESIUM     Status: None   Collection Time    12/08/13  1:01 AM      Result Value Ref Range   Magnesium 1.8  1.5 - 2.5 mg/dL  GLUCOSE, CAPILLARY     Status: None   Collection Time    12/08/13  8:16 AM      Result Value Ref Range   Glucose-Capillary 87  70 - 99 mg/dL   Comment 1 Capillary Sample    CBC     Status: Abnormal   Collection Time    12/08/13  9:08 AM      Result Value Ref Range   WBC 4.3  4.0 - 10.5 K/uL   RBC 3.13 (*) 4.22 - 5.81 MIL/uL   Hemoglobin 7.3 (*) 13.0 - 17.0 g/dL   HCT 24.2 (*) 39.0 - 52.0 %   MCV 77.3 (*) 78.0 - 100.0 fL   MCH 23.3 (*) 26.0 - 34.0 pg   MCHC 30.2  30.0 - 36.0 g/dL   RDW 17.1 (*) 11.5 - 15.5 %   Platelets 424 (*) 150 - 400 K/uL  GLUCOSE, CAPILLARY     Status: Abnormal   Collection Time    12/08/13 12:19 PM      Result Value Ref Range   Glucose-Capillary 107 (*) 70 - 99 mg/dL    Signed: Otho Bellows, MD 12/08/2013, 6:53 PM    Services Ordered on Discharge: None Equipment Ordered on Discharge: None

## 2013-12-08 NOTE — Progress Notes (Signed)
Pt D/C paperwork and instructions were given. Taxi service was called and pt walked out of the [employer]. Pt VS WNL, 1600 medications were given and instructions on when follow up appointment and transportation was discussed. Pt is in understanding and agreeable with medical plan and d/c. All belongings were sent home with pt and pt stated that he didn't have anything else to take with him. Pt will be going home to stay with his daughter in [place]. Pt in no pain at time of D/C, safety maintained.

## 2013-12-08 NOTE — Progress Notes (Signed)
  Date: 12/08/2013  Patient name: Charles [name]  Medical record number: [id number]  Date of birth: [birth year]   This patient has been seen and the plan of care was discussed with the house staff. Please see their note for complete details. I concur with their findings with the following additions/corrections: Mr Doig was seen with Dr Eulas Post. I have reviewed and agree with her note. D/C home today. F/U will be arranged and given to pt.  Bartholomew Crews, MD 12/08/2013, 3:26 PM

## 2013-12-08 NOTE — Progress Notes (Signed)
UR completed 

## 2013-12-08 NOTE — Progress Notes (Signed)
CSW (Clinical Education officer, museum) notified that pt does not have transportation home. After speaking with pt, CSW spoke with supervisor and it was decided to provide pt with a taxi voucher to [employer]. CSW confirmed address with pt and pt nurse provided with taxi number to call when pt is ready for dc. CSW signing off.  [place], [place]

## 2013-12-08 NOTE — Progress Notes (Signed)
Patient ID: Charles [name] MRN: [id number], DOB/AGE: Mar 17, 1946   Admit date: 12/07/2013   Primary Physician: [name] Primary Cardiologist: Dr. Wyline Copas - [place]   Pt. Profile:  68 y/o male with h/o CAD s/p CABG in 2009 with multiple recent admissions related to c/p, HTN, chf all in the setting of ongoing cocaine abuse, who presented to the ED today w/ recurrent chest pain.  Problem List  Past Medical History  Diagnosis Date  . Iron deficiency anemia   . Chronic combined systolic and diastolic CHF, NYHA class 2     a. 11/2013 Echo: EF 40-45%, basl-mid inflat AK, Gr 2 DD.  Charles [name] Hyperlipidemia LDL goal <70   . Obstructive sleep apnea     a. not on home CPAP  . Ischemic cardiomyopathy     a. 11/2013 Echo: EF 40-45%, basl-mid inflat AK, Gr 2 DD, mild AI, mod dil LA/RA, mod reduced RV fxn, PASP 73mmHg.  Charles [name] Homelessness   . Moderate to severe pulmonary hypertension 03/2010    a. PA peak pressure of 76 mmHg (per 2D Echo 03/2010)  . Polysubstance abuse     a. cocaine, THC  . AV malformation of gastrointestinal tract     a. w/ h/o GIB.  Charles [name] Family history of early CAD   . DDD (degenerative disc disease), lumbar   . Peripheral vascular disease   . Seizure disorder   . Seizures   . Hypertension   . CAD (coronary artery disease)     a. s/p 3-vessel CABG (12/2007) // 100% RCA stenosis with collaterals from left system. Severe bifurcation lesions of proxima CXA and OM. Moderate LAD disease - followed by Dr. Wyline Copas in [facility];  b. 11/2013 Myoview: Large fixed inf defect w/o ischemia, EF 35%.  . Diabetes   . Chronic kidney disease (CKD), stage III (moderate)     a. BL SCr 1.5-1.6  . Renal artery stenosis     Past Surgical History  Procedure Laterality Date  . Apc  03/2010    To treat small bowel AVMs  . Esophagogastroduodenoscopy N/A 08/14/2012    Procedure: ESOPHAGOGASTRODUODENOSCOPY (EGD);  Surgeon: Charles Craver, MD;  Location: [facility] ENDOSCOPY;  Service: Endoscopy;  Laterality:  N/A;  . Hot hemostasis N/A 08/14/2012    Procedure: HOT HEMOSTASIS (ARGON PLASMA COAGULATION/BICAP);  Surgeon: Charles Craver, MD;  Location: [facility] ENDOSCOPY;  Service: Endoscopy;  Laterality: N/A;  . Esophagogastroduodenoscopy (egd) with propofol N/A 08/04/2013    Procedure: ESOPHAGOGASTRODUODENOSCOPY (EGD) WITH PROPOFOL;  Surgeon: Charles Artist, MD;  Location: [facility] ENDOSCOPY;  Service: Endoscopy;  Laterality: N/A;  . Coronary artery bypass graft  12/2007     Allergies  Allergies  Allergen Reactions  . Motrin [Ibuprofen] Other (See Comments)    Affects kidneys    HPI  68 y/o male with the above complex problem list.  He is s/p CABG in 2009 and has previously been followed by [facility] cardiology.  Chart indicated that he has been living in a homeless shelter in [place], however he denies this, saying that he lives with his dtr in [place]..  He has had several admissions this year, this being his third since August.  He was last admitted on 9/9 with hypertensive urgency, CHF, and chest pain.  He ruled out for MI.  His antihypertensives were titrated and he was aggressively diuresed down to ~ 167 lbs (was 175 on admission @ that time).  B/c of recurrent chest pain, a myoview was performed and  showed a large, fixed inferior defect w/o evidence of ischemia.  Echo showed LV dysfxn, with an EF of 40-45%.  He was discharged home on 9/12.  He says that he has been staying with his dtr and weighing himself daily - says his wt has been running in 'the 160's.'  He has not been having chest pain but has chronic 2 pillow orthopnea.  He reports compliance with meds but did use cocaine on Thursday and hasn't taken meds since.    This morning, he awoke at about 3:30 AM with productive cough/yellow sputum, chest pain, and dyspnea.  He eventually called EMS and was taken to the [facility] ED.  Here, ECG was non-acute.  Troponin is nl, pBNP is elevated @ 1908 (5616 on 9/9), H/H down to 7.5 and 24.7 (8.5/27.2 on 9/14).  UDS +  for cocaine.  He continues to report mild chest discomfort.  Home Medications - ? compliance  Prior to Admission medications   Medication Sig Start Date End Date Taking? Authorizing Provider  amLODipine (NORVASC) 10 MG tablet Take 10 mg by mouth daily.   Yes Historical Provider, MD  aspirin EC 81 MG tablet Take 81 mg by mouth daily.   Yes Historical Provider, MD  citalopram (CELEXA) 20 MG tablet Take 20 mg by mouth daily.   Yes Historical Provider, MD  cloNIDine (CATAPRES) 0.2 MG tablet Take 0.2 mg by mouth 2 (two) times daily.   Yes Historical Provider, MD  docusate sodium (COLACE) 100 MG capsule Take 100 mg by mouth 2 (two) times daily.   Yes Historical Provider, MD  ferrous sulfate 325 (65 FE) MG tablet Take 325 mg by mouth 3 (three) times daily.   Yes Historical Provider, MD  furosemide (LASIX) 80 MG tablet Take 80 mg by mouth daily.   Yes Historical Provider, MD  gabapentin (NEURONTIN) 100 MG capsule Take 100 mg by mouth 2 (two) times daily.   Yes Historical Provider, MD  hydrALAZINE (APRESOLINE) 50 MG tablet Take 50 mg by mouth 3 (three) times daily.   Yes Historical Provider, MD  isosorbide mononitrate (IMDUR) 30 MG 24 hr tablet Take 30 mg by mouth daily.   Yes Historical Provider, MD  pantoprazole (PROTONIX) 40 MG tablet Take 40 mg by mouth 2 (two) times daily.   Yes Historical Provider, MD  phenytoin (DILANTIN) 100 MG ER capsule Take 200-300 mg by mouth 2 (two) times daily. 200mg  in the morning, 300mg  in the evening   Yes Historical Provider, MD  simvastatin (ZOCOR) 40 MG tablet Take 40 mg by mouth every evening.   Yes Historical Provider, MD   Family History  Family History  Problem Relation Age of Onset  . Heart disease Mother     unknown type  . Heart disease Father 68    died of MI at 69yo  . Heart disease Paternal Grandfather 67    died of MI  . Heart disease    . Heart disease Brother 26   Social History  History   Social History  . Marital Status: Single     Spouse Name: N/A    Number of Children: 2  . Years of Education: N/A   Occupational History  . Unemployed     previously worked in [employer]  .     Social History Main Topics  . Smoking status: Current Some Day Smoker -- 0.25 packs/day for 56 years    Types: Cigarettes  . Smokeless tobacco: Never Used  . Alcohol Use: Yes  Comment: occasionally drinks, a few times a month  . Drug Use: 1.00 per week    Special: Cocaine, Marijuana     Comment: uses cocaine once/wk at least.  . Sexual Activity: No   Other Topics Concern  . Not on file   Social History Narrative   Lives in [place]. Originally from [place], has been in the [place] since 1980s      **11/2013 - says that he lives with dtr in [name].**                      Review of Systems General:  No chills, fever, night sweats or weight changes.  Cardiovascular:  +++ chest pain, +++ dyspnea on exertion, occas edema, +++ chronic 2 pillow orthopnea, no palpitations, paroxysmal nocturnal dyspnea. Dermatological: No rash, lesions/masses Respiratory: +++ productive cough w/ yellow sputum and dyspnea Urologic: No hematuria, dysuria Abdominal:   No nausea, vomiting, diarrhea, bright red blood per rectum, melena, or hematemesis Neurologic:  No visual changes, wkns, changes in mental status. All other systems reviewed and are otherwise negative except as noted above.  Physical Exam  Blood pressure 139/77, pulse 67, temperature 98.7 F (37.1 C), temperature source Oral, resp. rate 21, height 5\' 5"  (1.651 m), weight 171 lb 4.8 oz (77.7 kg), SpO2 99.00%.  General: Pleasant, NAD  Falls to sleep easily yet notes 8/10 CP    Psych: Normal affect. Neuro: Alert and oriented X 3. Moves all extremities spontaneously. HEENT: Normal  Neck: Supple without bruits.  JVP ~ 10cm. Lungs:  Resp regular and unlabored, diminished @ bases with bibasilar crackles. Heart: RRR no s3, s4, or murmurs. Abdomen: Soft, non-tender,  non-distended, BS + x 4.  Extremities: No clubbing, cyanosis. No edema. DP/PT/Radials 1+ and equal bilaterally.  Labs  Troponin [facility] Care Test)  Recent Labs  12/07/13 0540  TROPIPOC 0.04   Lab Results  Component Value Date   WBC 4.3 12/08/2013   HGB 7.3* 12/08/2013   HCT 24.2* 12/08/2013   MCV 77.3* 12/08/2013   PLT 424* 12/08/2013     Recent Labs Lab 12/07/13 0543 12/08/13 0101  NA  --  135*  K  --  4.5  CL  --  99  CO2  --  24  BUN  --  23  CREATININE  --  1.43*  CALCIUM  --  8.0*  PROT 7.5  --   BILITOT 0.2*  --   ALKPHOS 111  --   ALT 24  --   AST 54*  --   GLUCOSE  --  97   Lab Results  Component Value Date   CHOL 88 10/15/2013   HDL 32* 10/15/2013   LDLCALC 42 10/15/2013   TRIG 69 10/15/2013   CHOLHDL 2.8 10/15/2013    Radiology/Studies  Nm Myocar Multi W/spect W/wall Motion / Ef  11/29/2013   CLINICAL DATA:  Chest Pain  EXAM: MYOCARDIAL IMAGING WITH SPECT (REST AND PHARMACOLOGIC-STRESS)  GATED LEFT VENTRICULAR WALL MOTION STUDY  LEFT VENTRICULAR EJECTION FRACTION  ml  IMPRESSION: 1. No reversible ischemia.  Fixed large inferior wall defect.  2. Left ventricular dilatation with decreased inferior wall motion.  3. Left ventricular ejection fraction 35%  4. High -risk stress test findings*.  *2012 Appropriate Use Criteria for Coronary Revascularization Focused Update: J Am Coll Cardiol. 2876;81(1):572-620. http://content.airportbarriers.com.aspx?articleid=1201161   Electronically Signed   By: Arne Cleveland M.D.   On: 11/29/2013 15:22   Dg Chest [facility] [address]  12/07/2013   CLINICAL DATA:  Lethargy. Fatigue, shortness of breath and chest pain.  EXAM: PORTABLE CHEST - 1 VIEW    IMPRESSION: Vascular congestion and mild cardiomegaly. Mildly increased interstitial markings again noted, raising concern for mild persistent interstitial edema.   Electronically Signed   By: Garald Balding M.D.   On: 12/07/2013 05:55   ECG  ST, 105, antlat ST dep - not dramatically  changed from prior ECG 9/12.  ASSESSMENT AND PLAN  1.  Midsternal Chest Pain/CAD:  Pt presents with recurrent chest pain and dyspnea after using cocaine on Thursday.  He has been off of his home meds since then.  He has known CAD s/p CABG in 2009 with non-ischemic myoview last week.  He currently has no objective evidence of ischemia.    myoview recently showed no ischemia. Troponin levels are negative. I suspect this CP is due to his cocaine abuse.    2.  Acute on chronic combined syst/diast chf:  He is on hydralazine and nitrates.  His creatinine has increased slightly. Could consider addition of ACE-I has OP Not a good candidate for BB give his repeat cocaine use.   3.  Cocaine Abuse:  Complete cessation advised.  He fails to see the connection between his drug use, med noncompliance, and readmissions.  Social work consult.  4.  HTN: BP's elevated.  Resume home meds - norvasc, hydral/nitrate, clonidine.  ? Compliance @ home.  5. Anemia:  Work up per int. Med.   Will sign off.    Follow up with his cardiologist in [facility]   Thayer Headings, Brooke Bonito., MD, [facility] 12/08/2013, 10:26 AM [address]  [place] Pager [phone]

## 2013-12-13 ENCOUNTER — Emergency Department (HOSPITAL_COMMUNITY): Payer: Medicare Other

## 2013-12-13 ENCOUNTER — Encounter (HOSPITAL_COMMUNITY): Payer: Self-pay | Admitting: Emergency Medicine

## 2013-12-13 ENCOUNTER — Inpatient Hospital Stay (HOSPITAL_COMMUNITY)
Admission: EM | Admit: 2013-12-13 | Discharge: 2013-12-15 | DRG: 293 | Disposition: A | Discharge status: Home / Self Care | Payer: Medicare Other | Attending: Internal Medicine | Admitting: Internal Medicine

## 2013-12-13 DIAGNOSIS — N182 Chronic kidney disease, stage 2 (mild): Secondary | ICD-10-CM | POA: Diagnosis present

## 2013-12-13 DIAGNOSIS — D649 Anemia, unspecified: Secondary | ICD-10-CM

## 2013-12-13 DIAGNOSIS — I5033 Acute on chronic diastolic (congestive) heart failure: Secondary | ICD-10-CM | POA: Diagnosis not present

## 2013-12-13 DIAGNOSIS — D509 Iron deficiency anemia, unspecified: Secondary | ICD-10-CM | POA: Diagnosis present

## 2013-12-13 DIAGNOSIS — E785 Hyperlipidemia, unspecified: Secondary | ICD-10-CM | POA: Diagnosis present

## 2013-12-13 DIAGNOSIS — G4733 Obstructive sleep apnea (adult) (pediatric): Secondary | ICD-10-CM | POA: Diagnosis present

## 2013-12-13 DIAGNOSIS — I2510 Atherosclerotic heart disease of native coronary artery without angina pectoris: Secondary | ICD-10-CM | POA: Diagnosis present

## 2013-12-13 DIAGNOSIS — I129 Hypertensive chronic kidney disease with stage 1 through stage 4 chronic kidney disease, or unspecified chronic kidney disease: Secondary | ICD-10-CM | POA: Diagnosis present

## 2013-12-13 DIAGNOSIS — R195 Other fecal abnormalities: Secondary | ICD-10-CM

## 2013-12-13 DIAGNOSIS — Z91199 Patient's noncompliance with other medical treatment and regimen due to unspecified reason: Secondary | ICD-10-CM

## 2013-12-13 DIAGNOSIS — M109 Gout, unspecified: Secondary | ICD-10-CM | POA: Diagnosis present

## 2013-12-13 DIAGNOSIS — R079 Chest pain, unspecified: Secondary | ICD-10-CM | POA: Diagnosis present

## 2013-12-13 DIAGNOSIS — F1411 Cocaine abuse, in remission: Secondary | ICD-10-CM

## 2013-12-13 DIAGNOSIS — I509 Heart failure, unspecified: Secondary | ICD-10-CM | POA: Diagnosis present

## 2013-12-13 DIAGNOSIS — Z9119 Patient's noncompliance with other medical treatment and regimen: Secondary | ICD-10-CM

## 2013-12-13 DIAGNOSIS — J4489 Other specified chronic obstructive pulmonary disease: Secondary | ICD-10-CM | POA: Diagnosis present

## 2013-12-13 DIAGNOSIS — F1410 Cocaine abuse, uncomplicated: Secondary | ICD-10-CM | POA: Diagnosis present

## 2013-12-13 DIAGNOSIS — Z951 Presence of aortocoronary bypass graft: Secondary | ICD-10-CM

## 2013-12-13 DIAGNOSIS — E119 Type 2 diabetes mellitus without complications: Secondary | ICD-10-CM | POA: Diagnosis present

## 2013-12-13 DIAGNOSIS — J449 Chronic obstructive pulmonary disease, unspecified: Secondary | ICD-10-CM | POA: Diagnosis present

## 2013-12-13 LAB — CBC WITH DIFFERENTIAL/PLATELET
BASOS ABS: 0 10*3/uL (ref 0.0–0.1)
BASOS PCT: 1 % (ref 0–1)
EOS PCT: 5 % (ref 0–5)
Eosinophils Absolute: 0.3 10*3/uL (ref 0.0–0.7)
HEMATOCRIT: 21.1 % — AB (ref 39.0–52.0)
HEMOGLOBIN: 6.4 g/dL — AB (ref 13.0–17.0)
Lymphocytes Relative: 19 % (ref 12–46)
Lymphs Abs: 1.1 10*3/uL (ref 0.7–4.0)
MCH: 23.4 pg — AB (ref 26.0–34.0)
MCHC: 30.3 g/dL (ref 30.0–36.0)
MCV: 77 fL — AB (ref 78.0–100.0)
MONOS PCT: 9 % (ref 3–12)
Monocytes Absolute: 0.5 10*3/uL (ref 0.1–1.0)
Neutro Abs: 4.1 10*3/uL (ref 1.7–7.7)
Neutrophils Relative %: 68 % (ref 43–77)
Platelets: 361 10*3/uL (ref 150–400)
RBC: 2.74 MIL/uL — ABNORMAL LOW (ref 4.22–5.81)
RDW: 17.9 % — AB (ref 11.5–15.5)
WBC: 6 10*3/uL (ref 4.0–10.5)

## 2013-12-13 LAB — PROTIME-INR
INR: 1.23 (ref 0.00–1.49)
Prothrombin Time: 15.5 seconds — ABNORMAL HIGH (ref 11.6–15.2)

## 2013-12-13 LAB — COMPREHENSIVE METABOLIC PANEL
ALBUMIN: 3 g/dL — AB (ref 3.5–5.2)
ALT: 14 U/L (ref 0–53)
AST: 27 U/L (ref 0–37)
Alkaline Phosphatase: 100 U/L (ref 39–117)
Anion gap: 14 (ref 5–15)
BUN: 20 mg/dL (ref 6–23)
CALCIUM: 8.2 mg/dL — AB (ref 8.4–10.5)
CO2: 20 mEq/L (ref 19–32)
CREATININE: 1.18 mg/dL (ref 0.50–1.35)
Chloride: 104 mEq/L (ref 96–112)
GFR calc Af Amer: 71 mL/min — ABNORMAL LOW (ref >90)
GFR calc non Af Amer: 61 mL/min — ABNORMAL LOW (ref >90)
Glucose, Bld: 89 mg/dL (ref 70–99)
Potassium: 4.5 mEq/L (ref 3.7–5.3)
Sodium: 138 mEq/L (ref 137–147)
Total Bilirubin: 0.3 mg/dL (ref 0.3–1.2)
Total Protein: 7.3 g/dL (ref 6.0–8.3)

## 2013-12-13 LAB — RAPID URINE DRUG SCREEN, HOSP PERFORMED
AMPHETAMINES: NOT DETECTED
BARBITURATES: NOT DETECTED
Benzodiazepines: NOT DETECTED
COCAINE: POSITIVE — AB
OPIATES: POSITIVE — AB
Tetrahydrocannabinol: POSITIVE — AB

## 2013-12-13 LAB — APTT: aPTT: 44 seconds — ABNORMAL HIGH (ref 24–37)

## 2013-12-13 LAB — PREPARE RBC (CROSSMATCH)

## 2013-12-13 LAB — I-STAT TROPONIN, ED: Troponin i, poc: 0.04 ng/mL (ref 0.00–0.08)

## 2013-12-13 LAB — POC OCCULT BLOOD, ED: Fecal Occult Bld: POSITIVE — AB

## 2013-12-13 LAB — PRO B NATRIURETIC PEPTIDE: Pro B Natriuretic peptide (BNP): 6456 pg/mL — ABNORMAL HIGH (ref 0–125)

## 2013-12-13 MED ORDER — MORPHINE SULFATE 4 MG/ML IJ SOLN
2.0000 mg | Freq: Once | INTRAMUSCULAR | Status: AC
  Administered 2013-12-13: 2 mg via INTRAVENOUS
  Filled 2013-12-13: qty 1

## 2013-12-13 MED ORDER — FUROSEMIDE 10 MG/ML IJ SOLN
60.0000 mg | Freq: Once | INTRAMUSCULAR | Status: AC
  Administered 2013-12-13: 60 mg via INTRAVENOUS
  Filled 2013-12-13: qty 6

## 2013-12-13 MED ORDER — SODIUM CHLORIDE 0.9 % IV SOLN
Freq: Once | INTRAVENOUS | Status: AC
Start: 2013-12-13 — End: 2013-12-14
  Administered 2013-12-14: 01:00:00 via INTRAVENOUS

## 2013-12-13 NOTE — H&P (Signed)
Date: 12/14/2013               Patient Name:  Charles [name] MRN: [id number]  DOB: 09-Nov-1945 Age / Sex: 68 y.o., male   PCP: [name]         Medical Service: Internal Medicine Teaching Service         Attending Physician: Dr. Bartholomew Crews, MD    First Contact: Dr. Eulas Post  Pager: [phone]  Second Contact: Dr. Eulas Post Pager: [phone]       After Hours (After 5p/  First Contact Pager: [phone]  weekends / holidays): Second Contact Pager: [phone]   Chief Complaint: Chest pain and SOB  History of Present Illness: 68 year old man with a history of HFpEF, CAD s/p 3VCABG 2009, anemia with h/o GI bleed, and cocaine abuse who presents with chest pain. He was last admitted for the same above reasons from 9/20-21. Pt stopped taking his home medications 3 days ago b/c he did not feel well and last used cocaine 4 days ago. Today around 12:00pm he started coughing with sputum production that was white w/ streaking. The coughing caused him to be SOB. He also starting experiencing pain from his neck down to his abdomen which he describes as a sore feeling. He also has rt sided chest pain that is sharp in nature and centered around his sternal area. He rates chest pain as 8/10 and constant. He also states that his chest burns when he breaths. He states that this chest pain is different from his prior episodes due to the pain that starts at his neck and occurs on both sides of his chest. He also reports he starts coughing when he has fluid build up. Pt reports generalized abdominal pain that is dull in nature and he rates it as 10/10 in severity. He has noticed swelling in his abdomen and legs. He reports fatigue and weakness. Denies fevers, changes in appetite, blood in stools, change in odor of stools, HA, and  pressure behind eyes.    Meds: Current Facility-Administered Medications  Medication Dose Route Frequency Provider Last Rate Last Dose  . amLODipine (NORVASC) tablet 10 mg  10 mg  Oral Daily Jones Bales, MD      . aspirin EC tablet 81 mg  81 mg Oral Daily Jones Bales, MD      . citalopram (CELEXA) tablet 20 mg  20 mg Oral Daily Jones Bales, MD      . cloNIDine (CATAPRES) tablet 0.2 mg  0.2 mg Oral BID Jones Bales, MD      . docusate sodium (COLACE) capsule 100 mg  100 mg Oral BID Jones Bales, MD      . ferrous sulfate tablet 325 mg  325 mg Oral Daily Jones Bales, MD      . furosemide (LASIX) injection 40 mg  40 mg Intravenous Daily Jones Bales, MD      . gabapentin (NEURONTIN) capsule 100 mg  100 mg Oral BID Jones Bales, MD      . hydrALAZINE (APRESOLINE) tablet 50 mg  50 mg Oral TID Jones Bales, MD      . isosorbide mononitrate (IMDUR) 24 hr tablet 30 mg  30 mg Oral Daily Jones Bales, MD      . morphine 2 MG/ML injection 2 mg  2 mg Intravenous Q2H PRN Jones Bales, MD   2 mg at 12/14/13 0139  . nitroGLYCERIN (NITROSTAT) SL  tablet 0.4 mg  0.4 mg Sublingual Q5 min PRN Jones Bales, MD      . phenytoin (DILANTIN) ER capsule 200 mg  200 mg Oral Daily Jones Bales, MD      . phenytoin (DILANTIN) ER capsule 300 mg  300 mg Oral QHS Bartholomew Crews, MD      . simvastatin (ZOCOR) tablet 40 mg  40 mg Oral QPM Jones Bales, MD        Allergies: Allergies as of 12/13/2013 - Review Complete 12/13/2013  Allergen Reaction Noted  . Motrin [ibuprofen] Other (See Comments) 05/16/2010  . Tylenol [acetaminophen] Other (See Comments) 05/16/2010   Past Medical History  Diagnosis Date  . Iron deficiency anemia   . Chronic combined systolic and diastolic CHF, NYHA class 2     a. 11/2013 Echo: EF 40-45%, basl-mid inflat AK, Gr 2 DD.  [name] Hyperlipidemia LDL goal <70   . Obstructive sleep apnea     a. not on home CPAP  . Ischemic cardiomyopathy     a. 11/2013 Echo: EF 40-45%, basl-mid inflat AK, Gr 2 DD, mild AI, mod dil LA/RA, mod reduced RV fxn, PASP 67mmHg.  [name] Homelessness   . Moderate to severe pulmonary  hypertension 03/2010    a. PA peak pressure of 76 mmHg (per 2D Echo 03/2010)  . Polysubstance abuse     a. cocaine, THC  . AV malformation of gastrointestinal tract     a. w/ h/o GIB.  [name] Family history of early CAD   . DDD (degenerative disc disease), lumbar   . Peripheral vascular disease   . Seizure disorder   . Seizures   . Hypertension   . CAD (coronary artery disease)     a. s/p 3-vessel CABG (12/2007) // 100% RCA stenosis with collaterals from left system. Severe bifurcation lesions of proxima CXA and OM. Moderate LAD disease - followed by Dr. Wyline Copas in [facility];  b. 11/2013 Myoview: Large fixed inf defect w/o ischemia, EF 35%.  . Diabetes   . Chronic kidney disease (CKD), stage III (moderate)     a. BL SCr 1.5-1.6  . Renal artery stenosis    Past Surgical History  Procedure Laterality Date  . Apc  03/2010    To treat small bowel AVMs  . Esophagogastroduodenoscopy N/A 08/14/2012    Procedure: ESOPHAGOGASTRODUODENOSCOPY (EGD);  Surgeon: [name], MD;  Location[facility] ENDOSCOPY;  Service: Endoscopy;  Laterality: N/A;  . Hot hemostasis N/A 08/14/2012    Procedure: HOT HEMOSTASIS (ARGON PLASMA COAGULATION/BICAP);  Surgeon: [name], MD;  Location[facility] ENDOSCOPY;  Service: Endoscopy;  Laterality: N/A;  . Esophagogastroduodenoscopy (egd) with propofol N/A 08/04/2013    Procedure: ESOPHAGOGASTRODUODENOSCOPY (EGD) WITH PROPOFOL;  Surgeon: [name], MD;  Location: [facility] ENDOSCOPY;  Service: Endoscopy;  Laterality: N/A;  . Coronary artery bypass graft  12/2007   Family History  Problem Relation Age of Onset  . Heart disease Mother     unknown type  . Heart disease Father 92    died of MI at 67yo  . Heart disease Paternal Grandfather 14    died of MI  . Heart disease    . Heart disease Brother 30   History   Social History  . Marital Status: Single    Spouse Name: N/A    Number of Children: 2  . Years of Education: N/A   Occupational History  . Unemployed      previously worked in [employer]  .  Social History Main Topics  . Smoking status: Current Some Day Smoker -- 0.25 packs/day for 56 years    Types: Cigarettes  . Smokeless tobacco: Never Used  . Alcohol Use: Yes     Comment: occasionally drinks, a few times a month  . Drug Use: 1.00 per week    Special: Cocaine, Marijuana     Comment: uses cocaine once/wk at least.  . Sexual Activity: No   Other Topics Concern  . Not on file   Social History Narrative   Lives in [place]. Originally from [place], has been in the [place] since 1980s      **11/2013 - says that he lives with dtr in [facility].**                      Review of Systems: A comprehensive review of systems was negative except for: nausea, fatigue, weakness, abdominal pain, dizziness, and chills  Physical Exam: Blood pressure 174/96, pulse 94, temperature 97.8 F (36.6 C), temperature source Oral, resp. rate 20, height 5' 4.96" (1.65 m), weight 173 lb 1.6 oz (78.518 kg), SpO2 96.00%. BP 174/96  Pulse 94  Temp(Src) 97.8 F (36.6 C) (Oral)  Resp 20  Ht 5' 4.96" (1.65 m)  Wt 173 lb 1.6 oz (78.518 kg)  BMI 28.84 kg/m2  SpO2 96%  General Appearance:    Alert, cooperative, no distress  Eyes:    PERRL, conjunctiva/corneas clear     Throat:   Lips, mucosa, and tongue normal; teeth and gums normal  Neck:    no carotid bruit or JVD  Lungs:     Lower rt lobe crackles  Chest wall:    Tender to palpation of chest, midline sternum scar   Heart:    Regular rate and rhythm, S1 and S2 normal, no murmur, rub   or gallop  Abdomen:     Soft, tender to palpation of all 4 quadrants, active bowel sounds  Extremities:   No pedal edema  Neurologic:   CNII-XII grossly intact.       Lab results: Basic Metabolic Panel:  Recent Labs  12/13/13 2100  NA 138  K 4.5  CL 104  CO2 20  GLUCOSE 89  BUN 20  CREATININE 1.18  CALCIUM 8.2*   Liver Function Tests:  Recent Labs  12/13/13 2100  AST 27  ALT 14    ALKPHOS 100  BILITOT 0.3  PROT 7.3  ALBUMIN 3.0*   CBC:  Recent Labs  12/13/13 2100  WBC 6.0  NEUTROABS 4.1  HGB 6.4*  HCT 21.1*  MCV 77.0*  PLT 361   BNP:  Recent Labs  12/13/13 2100  PROBNP 6456.0*   Urine Drug Screen: Drugs of Abuse     Component Value Date/Time   LABOPIA POSITIVE* 12/13/2013 2310   COCAINSCRNUR POSITIVE* 12/13/2013 2310   LABBENZ NONE DETECTED 12/13/2013 2310   AMPHETMU NONE DETECTED 12/13/2013 2310   THCU POSITIVE* 12/13/2013 2310   LABBARB NONE DETECTED 12/13/2013 2310     Imaging results:  Dg Chest 2 View  12/13/2013   CLINICAL DATA:  Shortness of breath.  Hypertension.  EXAM: CHEST  2 VIEW  COMPARISON:  12/07/2013  FINDINGS: Shallow inspiration. Postoperative change in the mediastinum. Cardiac enlargement with pulmonary vascular congestion. Mild hazy interstitial pattern suggest mild edema. No airspace disease or consolidation. No blunting of costophrenic angles. No pneumothorax.  IMPRESSION: Cardiac enlargement with pulmonary vascular congestion and mild interstitial edema. Similar to previous study.  Electronically Signed   By: Lucienne Capers M.D.   On: 12/13/2013 22:51    Other results: EKG: unchanged from previous tracings, normal sinus rhythm. Prolonged QTc  Assessment & Plan by Problem: Active Problems:   Chest pain  Anemia--  admission Hb 6.4 down from 7.3 on 9/21. Baseline Hb around 7-9. Fecal occult positive. Pt reports SOB, fatigue, weakness, and pain from neck down to abdomen. Has history of upper GI AVM bleed in 2012 s/p EGD revealing not actively bleeding AVM now s/p ablation. Previous iron studies on 5/16 c/w iron deficiency anemia - admit to tele - transfuse 1U pRBCs - continue Fe supplementation - consult GI - CBC, BMP am labs - repeat HIV lab  Acute on chronic diastolic heart failure-- BNP 6456 elevated from 1908 on 9/20. CXR unchanged from previous CXR 6 days ago. ECHO 9/10 with reduced EF 40-45%,Lexiscan 11/29/13 that  revealed fixed inferior defect w/o evidence of ischemia. Likely due to noncompliance w/ home med lasix 80mg  daily. Dry weight appears to be in the160's lbs and pt weights 171 on admission. Given lasix 60mg  IV once in the ED. Pt appears euvolemic on physical exam w/o pedal edema although has rt lower lobe crackles on exam.  - hydralazine for afterload reduction - lasix 40mg  IV tomorrow - strict I&O's  - daily weights  - orthostatic vitals  Midsternal chest pain--this chest pain is recurrent. He has known CAD s/p CABG in 2009 and had a negative cardiac workup this month. Myoview on 9/12 revealed no evidence of ischemia. Ongoing cocaine abuse likely cause of pt's chest pain.  - continue asa, imdur, ccb, statin - morphine for pain management - trend troponins, repeat EKG in the morning  HTN-- BP on admission 171/99 likely elevated 2/2 med non compliance (rebound htn from clonidine), cocaine use, and pain. Home meds include norvasc 10mg , clonidine 0.2mg  BID, hydralazine 50mg  TID, and imdur 30mg  - continue home meds - avoid BB in setting of cocaine abuse  Cocaine abuse - consult social work  Diet-- NPO in case of GI procedure in the morning  DVT prophylaxis-- SCDs  Dispo: Disposition is deferred at this time, awaiting improvement of current medical problems. Anticipated discharge in approximately 1-2 day(s).   The patient does have a current PCP [name]) and does not need an [facility] hospital follow-up appointment after discharge.  Signed: Julious Oka, MD 12/14/2013, 1:46 AM

## 2013-12-13 NOTE — ED Notes (Addendum)
Pt presents to ED via EMS with c/o chest pain onset at noon today and generalize body ache, associated with nausea/vomiting, and chills. Pt also reports cough for a couple of months. Pt reports pain from neck down. EMS given 324mg  ASA and sublingual nitro x3

## 2013-12-13 NOTE — ED Provider Notes (Signed)
CSN: [id number]     Arrival date & time 12/13/13  2050 History   First MD Initiated Contact with Patient 12/13/13 2055     Chief Complaint  Patient presents with  . Chest Pain  . Generalized Body Aches    (Consider location/radiation/quality/duration/timing/severity/associated sxs/prior Treatment) HPI Comments: Patient is a 68 year old male with a history of CHF (LVEF 40-45% on echo on 11/27/2013 and 35% on Lexiscan on 11/29/13), CAD s/p CABG x 3 in 2009, cocaine induced coronary vasospasm, pulmonary HTN, PVD, DM, and stage III CKD. He presents to the ED today, 5 days after hospital discharge for chest pain and shortness of breath, with complaints of same. Patient states that he developed a worsening cough at noon today. This was followed by chest pain, nausea, and weakness 30 minutes later. Patient describes his chest pain as burning and sore. He states "I'm sore from my neck down to my belly". Patient endorses shortness of breath which is worsened with coughing. Patient states he has been coughing up white phlegm streaked with bright red blood. He also keeps repeating, "I've been cold with chills and then hot. I'm anemic." Patient has noticed increased abdominal distension and leg swelling since his most recent discharge. He states he believes he gained 3 pounds. 2 pillow orthopnea persists. Patient denies fever, hematemesis, melena, hematochezia, syncope, near syncope, and extremity numbness/paresthesias. He states he last used cocaine on 12/09/13, the day after his most recent hospital discharge.  Cardiologist - Dr. Wyline Copas ([facility]) PCP - Dr. Alroy Dust  Patient is a 68 y.o. male presenting with chest pain. The history is provided by the patient. No language interpreter was used.  Chest Pain Associated symptoms: cough, nausea, shortness of breath and weakness   Associated symptoms: no fever, no numbness and not vomiting     Past Medical History  Diagnosis Date  . Iron deficiency anemia   .  Chronic combined systolic and diastolic CHF, NYHA class 2     a. 11/2013 Echo: EF 40-45%, basl-mid inflat AK, Gr 2 DD.  [name] Hyperlipidemia LDL goal <70   . Obstructive sleep apnea     a. not on home CPAP  . Ischemic cardiomyopathy     a. 11/2013 Echo: EF 40-45%, basl-mid inflat AK, Gr 2 DD, mild AI, mod dil LA/RA, mod reduced RV fxn, PASP 45mmHg.  [name] Homelessness   . Moderate to severe pulmonary hypertension 03/2010    a. PA peak pressure of 76 mmHg (per 2D Echo 03/2010)  . Polysubstance abuse     a. cocaine, THC  . AV malformation of gastrointestinal tract     a. w/ h/o GIB.  [name] Family history of early CAD   . DDD (degenerative disc disease), lumbar   . Peripheral vascular disease   . Seizure disorder   . Seizures   . Hypertension   . CAD (coronary artery disease)     a. s/p 3-vessel CABG (12/2007) // 100% RCA stenosis with collaterals from left system. Severe bifurcation lesions of proxima CXA and OM. Moderate LAD disease - followed by Dr. Wyline Copas in [facility];  b. 11/2013 Myoview: Large fixed inf defect w/o ischemia, EF 35%.  . Diabetes   . Chronic kidney disease (CKD), stage III (moderate)     a. BL SCr 1.5-1.6  . Renal artery stenosis    Past Surgical History  Procedure Laterality Date  . Apc  03/2010    To treat small bowel AVMs  . Esophagogastroduodenoscopy N/A 08/14/2012  Procedure: ESOPHAGOGASTRODUODENOSCOPY (EGD);  Surgeon: [name], MD;  Location: [facility] ENDOSCOPY;  Service: Endoscopy;  Laterality: N/A;  . Hot hemostasis N/A 08/14/2012    Procedure: HOT HEMOSTASIS (ARGON PLASMA COAGULATION/BICAP);  Surgeon: [name], MD;  Location: [facility] ENDOSCOPY;  Service: Endoscopy;  Laterality: N/A;  . Esophagogastroduodenoscopy (egd) with propofol N/A 08/04/2013    Procedure: ESOPHAGOGASTRODUODENOSCOPY (EGD) WITH PROPOFOL;  Surgeon: [name], MD;  Location: [facility] ENDOSCOPY;  Service: Endoscopy;  Laterality: N/A;  . Coronary artery bypass graft  12/2007   Family History  Problem  Relation Age of Onset  . Heart disease Mother     unknown type  . Heart disease Father 37    died of MI at 33yo  . Heart disease Paternal Grandfather 82    died of MI  . Heart disease    . Heart disease Brother 30   History  Substance Use Topics  . Smoking status: Current Some Day Smoker -- 0.25 packs/day for 56 years    Types: Cigarettes  . Smokeless tobacco: Never Used  . Alcohol Use: Yes     Comment: occasionally drinks, a few times a month    Review of Systems  Constitutional: Positive for chills. Negative for fever.  Respiratory: Positive for cough and shortness of breath.   Cardiovascular: Positive for chest pain and leg swelling.  Gastrointestinal: Positive for nausea and abdominal distention. Negative for vomiting, diarrhea and blood in stool.  Neurological: Positive for weakness. Negative for syncope and numbness.  All other systems reviewed and are negative.   Allergies  Motrin and Tylenol  Home Medications   Prior to Admission medications   Medication Sig Start Date End Date Taking? Authorizing Provider  amLODipine (NORVASC) 10 MG tablet Take 10 mg by mouth daily.   Yes Historical Provider, MD  aspirin EC 81 MG tablet Take 81 mg by mouth daily.   Yes Historical Provider, MD  citalopram (CELEXA) 20 MG tablet Take 20 mg by mouth daily.   Yes Historical Provider, MD  cloNIDine (CATAPRES) 0.2 MG tablet Take 0.2 mg by mouth 2 (two) times daily.   Yes Historical Provider, MD  docusate sodium (COLACE) 100 MG capsule Take 100 mg by mouth 2 (two) times daily.   Yes Historical Provider, MD  ferrous sulfate 325 (65 FE) MG tablet Take 325 mg by mouth daily.    Yes Historical Provider, MD  furosemide (LASIX) 80 MG tablet Take 80 mg by mouth daily.   Yes Historical Provider, MD  gabapentin (NEURONTIN) 100 MG capsule Take 100 mg by mouth 2 (two) times daily.   Yes Historical Provider, MD  hydrALAZINE (APRESOLINE) 50 MG tablet Take 50 mg by mouth 3 (three) times daily.   Yes  Historical Provider, MD  isosorbide mononitrate (IMDUR) 30 MG 24 hr tablet Take 30 mg by mouth daily.   Yes Historical Provider, MD  pantoprazole (PROTONIX) 40 MG tablet Take 40 mg by mouth 2 (two) times daily.   Yes Historical Provider, MD  phenytoin (DILANTIN) 100 MG ER capsule Take 200-300 mg by mouth 2 (two) times daily. 200mg  in the morning, 300mg  in the evening   Yes Historical Provider, MD  simvastatin (ZOCOR) 40 MG tablet Take 40 mg by mouth every evening.   Yes Historical Provider, MD   BP 171/99  Pulse 82  Temp(Src) 99 F (37.2 C) (Oral)  Resp 24  Wt 171 lb (77.565 kg)  SpO2 96%  Physical Exam  Nursing note and vitals reviewed. Constitutional: He is  oriented to person, place, and time. He appears well-developed and well-nourished. No distress.  Nontoxic appearing  HENT:  Head: Normocephalic and atraumatic.  Eyes: Conjunctivae and EOM are normal. Pupils are equal, round, and reactive to light. No scleral icterus.  Neck: Normal range of motion. Neck supple.  Cardiovascular: Normal rate, regular rhythm and normal heart sounds.   No JVD  Pulmonary/Chest: Effort normal. No respiratory distress. He has decreased breath sounds.  Mild expiratory wheezing appreciated in right upper lung field. Mild rhonchi appreciated at bilateral bases. Patient exhibits dyspnea without tachypnea. No retractions or accessory muscle use. Chest expansion symmetrical.  Abdominal: Soft. He exhibits distension. There is tenderness. There is no rebound and no guarding.  Soft mildly distended abdomen. Patient exhibits diffuse tenderness to palpation. No focal tenderness. No masses or peritoneal signs.  Musculoskeletal: Normal range of motion. He exhibits edema.  1+ pitting edema in bilateral lower extremities.  Neurological: He is alert and oriented to person, place, and time. He exhibits normal muscle tone. Coordination normal.  Skin: Skin is warm and dry. No rash noted. He is not diaphoretic. No erythema.  No pallor.  Psychiatric: He has a normal mood and affect. His behavior is normal.    ED Course  Procedures (including critical care time) Labs Review Labs Reviewed  PRO B NATRIURETIC PEPTIDE - Abnormal; Notable for the following:    Pro B Natriuretic peptide (BNP) 6456.0 (*)    All other components within normal limits  CBC WITH DIFFERENTIAL - Abnormal; Notable for the following:    RBC 2.74 (*)    Hemoglobin 6.4 (*)    HCT 21.1 (*)    MCV 77.0 (*)    MCH 23.4 (*)    RDW 17.9 (*)    All other components within normal limits  COMPREHENSIVE METABOLIC PANEL - Abnormal; Notable for the following:    Calcium 8.2 (*)    Albumin 3.0 (*)    GFR calc non Af Amer 61 (*)    GFR calc Af Amer 71 (*)    All other components within normal limits  POC OCCULT BLOOD, ED - Abnormal; Notable for the following:    Fecal Occult Bld POSITIVE (*)    All other components within normal limits  URINE RAPID DRUG SCREEN (HOSP PERFORMED)  OCCULT BLOOD X 1 CARD TO LAB, STOOL  I-STAT TROPOININ, ED  TYPE AND SCREEN    Imaging Review Dg Chest 2 View  12/13/2013   CLINICAL DATA:  Shortness of breath.  Hypertension.  EXAM: CHEST  2 VIEW  COMPARISON:  12/07/2013  FINDINGS: Shallow inspiration. Postoperative change in the mediastinum. Cardiac enlargement with pulmonary vascular congestion. Mild hazy interstitial pattern suggest mild edema. No airspace disease or consolidation. No blunting of costophrenic angles. No pneumothorax.  IMPRESSION: Cardiac enlargement with pulmonary vascular congestion and mild interstitial edema. Similar to previous study.   Electronically Signed   By: Lucienne Capers M.D.   On: 12/13/2013 22:51     EKG Interpretation   Date/Time:  Saturday December 13 2013 20:56:02 EDT Ventricular Rate:  86 PR Interval:  180 QRS Duration: 115 QT Interval:  457 QTC Calculation: 547 R Axis:   62 Text Interpretation:  Sinus rhythm Nonspecific intraventricular conduction  delay Inferior  infarct, age indeterminate Lateral leads are also involved  since last tracing no significant change Confirmed by Eulis Foster  MD, ELLIOTT  [phone]) on 12/13/2013 9:29:07 PM      MDM   Final diagnoses:  Acute exacerbation of CHF (  congestive heart failure)  Symptomatic anemia  Guaiac positive stools  Chest pain, unspecified chest pain type  Hx of cocaine abuse    68 year old male presents to the emergency department for further evaluation of chest pain, shortness of breath, and cough. Patient has been seen and admitted x2 in the past 2 weeks for same. Patient with history of cocaine-induced coronary vasospasm; he endorses using cocaine following his most recent discharge. Patient's labs today reveal further anemia. Hemoglobin 6.4 down from 7.3 on 12/08/13. He is Hemoccult positive. BNP also elevated to 6456. Suspect that shortness of breath and cough are secondary to acute exacerbation of CHF. Patient endorses a weight gain since discharge as well as lower extremity edema. He has 1+ pitting edema in his bilateral lower extremities on exam today as well as decreased breath sounds. No evidence of focal consolidation or pneumonia on chest x-ray.   Patient hemodynamically stable on arrival. No tachycardia, tachypnea, or hypoxia. He does appear dyspneic at times. Patient will require likely transfusion for anemia as well as diuresis for CHF. Patient initially treated in the ED with 60 mg IV Lasix. Internal Medicine residents consulted who will admit the patient for further management of symptoms.    Filed Vitals:   12/13/13 2115 12/13/13 2130 12/13/13 2133 12/13/13 2200  BP: 167/93 169/96  171/99  Pulse: 80 89 86 82  Temp:      TempSrc:      Resp: 19  20 24   Weight:      SpO2: 96%  95% 96%     Antonietta Breach, PA-C 12/13/13 2306

## 2013-12-13 NOTE — ED Notes (Signed)
Report attempted 

## 2013-12-13 NOTE — ED Notes (Signed)
Internal medicine consult at bedside.

## 2013-12-13 NOTE — ED Provider Notes (Signed)
Face-to-face evaluation   History: here for evaluation of CP and SOB. He continues to abuse Cocaine. He has chest tightness, cough with sputum and achiness  Physical exam: No respiratory distress. Anxious. Lungs, decrease air mvt. Bilat. With scattered wheeze. Legs with brawney edema bilateral.  Results for orders placed during the hospital encounter of 12/13/13  URINE RAPID DRUG SCREEN (HOSP PERFORMED)      Result Value Ref Range   Opiates POSITIVE (*) NONE DETECTED   Cocaine POSITIVE (*) NONE DETECTED   Benzodiazepines NONE DETECTED  NONE DETECTED   Amphetamines NONE DETECTED  NONE DETECTED   Tetrahydrocannabinol POSITIVE (*) NONE DETECTED   Barbiturates NONE DETECTED  NONE DETECTED  PRO B NATRIURETIC PEPTIDE      Result Value Ref Range   Pro B Natriuretic peptide (BNP) 6456.0 (*) 0 - 125 pg/mL  CBC WITH DIFFERENTIAL      Result Value Ref Range   WBC 6.0  4.0 - 10.5 K/uL   RBC 2.74 (*) 4.22 - 5.81 MIL/uL   Hemoglobin 6.4 (*) 13.0 - 17.0 g/dL   HCT 21.1 (*) 39.0 - 52.0 %   MCV 77.0 (*) 78.0 - 100.0 fL   MCH 23.4 (*) 26.0 - 34.0 pg   MCHC 30.3  30.0 - 36.0 g/dL   RDW 17.9 (*) 11.5 - 15.5 %   Platelets 361  150 - 400 K/uL   Neutrophils Relative % 68  43 - 77 %   Neutro Abs 4.1  1.7 - 7.7 K/uL   Lymphocytes Relative 19  12 - 46 %   Lymphs Abs 1.1  0.7 - 4.0 K/uL   Monocytes Relative 9  3 - 12 %   Monocytes Absolute 0.5  0.1 - 1.0 K/uL   Eosinophils Relative 5  0 - 5 %   Eosinophils Absolute 0.3  0.0 - 0.7 K/uL   Basophils Relative 1  0 - 1 %   Basophils Absolute 0.0  0.0 - 0.1 K/uL  COMPREHENSIVE METABOLIC PANEL      Result Value Ref Range   Sodium 138  137 - 147 mEq/L   Potassium 4.5  3.7 - 5.3 mEq/L   Chloride 104  96 - 112 mEq/L   CO2 20  19 - 32 mEq/L   Glucose, Bld 89  70 - 99 mg/dL   BUN 20  6 - 23 mg/dL   Creatinine, Ser 1.18  0.50 - 1.35 mg/dL   Calcium 8.2 (*) 8.4 - 10.5 mg/dL   Total Protein 7.3  6.0 - 8.3 g/dL   Albumin 3.0 (*) 3.5 - 5.2 g/dL   AST 27  0  - 37 U/L   ALT 14  0 - 53 U/L   Alkaline Phosphatase 100  39 - 117 U/L   Total Bilirubin 0.3  0.3 - 1.2 mg/dL   GFR calc non Af Amer 61 (*) >90 mL/min   GFR calc Af Amer 71 (*) >90 mL/min   Anion gap 14  5 - 15  PROTIME-INR      Result Value Ref Range   Prothrombin Time 15.5 (*) 11.6 - 15.2 seconds   INR 1.23  0.00 - 1.49  APTT      Result Value Ref Range   aPTT 44 (*) 24 - 37 seconds  TROPONIN I      Result Value Ref Range   Troponin I <0.30  <0.30 ng/mL  TROPONIN I      Result Value Ref Range   Troponin I <  0.30  <0.30 ng/mL  BASIC METABOLIC PANEL      Result Value Ref Range   Sodium 138  137 - 147 mEq/L   Potassium 4.2  3.7 - 5.3 mEq/L   Chloride 102  96 - 112 mEq/L   CO2 21  19 - 32 mEq/L   Glucose, Bld 96  70 - 99 mg/dL   BUN 20  6 - 23 mg/dL   Creatinine, Ser 1.20  0.50 - 1.35 mg/dL   Calcium 8.3 (*) 8.4 - 10.5 mg/dL   GFR calc non Af Amer 60 (*) >90 mL/min   GFR calc Af Amer 69 (*) >90 mL/min   Anion gap 15  5 - 15  CBC      Result Value Ref Range   WBC 4.0  4.0 - 10.5 K/uL   RBC 3.20 (*) 4.22 - 5.81 MIL/uL   Hemoglobin 7.8 (*) 13.0 - 17.0 g/dL   HCT 25.6 (*) 39.0 - 52.0 %   MCV 80.0  78.0 - 100.0 fL   MCH 24.4 (*) 26.0 - 34.0 pg   MCHC 30.5  30.0 - 36.0 g/dL   RDW 18.1 (*) 11.5 - 15.5 %   Platelets 345  150 - 400 K/uL  I-STAT TROPOININ, ED      Result Value Ref Range   Troponin i, poc 0.04  0.00 - 0.08 ng/mL   Comment 3           POC OCCULT BLOOD, ED      Result Value Ref Range   Fecal Occult Bld POSITIVE (*) NEGATIVE  TYPE AND SCREEN      Result Value Ref Range   ABO/RH(D) O POS     Antibody Screen NEG     Sample Expiration 12/16/2013     Unit Number [id number]     Blood Component Type RED CELLS,LR     Unit division 00     Status of Unit ISSUED     Transfusion Status OK TO TRANSFUSE     Crossmatch Result Compatible    PREPARE RBC (CROSSMATCH)      Result Value Ref Range   Order Confirmation ORDER PROCESSED BY BLOOD BANK     Dg Chest 2  View  12/13/2013   CLINICAL DATA:  Shortness of breath.  Hypertension.  EXAM: CHEST  2 VIEW  COMPARISON:  12/07/2013  FINDINGS: Shallow inspiration. Postoperative change in the mediastinum. Cardiac enlargement with pulmonary vascular congestion. Mild hazy interstitial pattern suggest mild edema. No airspace disease or consolidation. No blunting of costophrenic angles. No pneumothorax.  IMPRESSION: Cardiac enlargement with pulmonary vascular congestion and mild interstitial edema. Similar to previous study.   Electronically Signed   By: Lucienne Capers M.D.   On: 12/13/2013 22:51    EKG Interpretation  Date/Time:  Saturday December 13 2013 20:56:02 EDT Ventricular Rate:  86 PR Interval:  180 QRS Duration: 115 QT Interval:  457 QTC Calculation: 547 R Axis:   62 Text Interpretation:  Sinus rhythm Nonspecific intraventricular conduction delay Inferior infarct, age indeterminate Lateral leads are also involved since last tracing no significant change Confirmed by [facility]  MD, Mccade Sullenberger [phone]) on 12/13/2013 9:29:07 PM      Medications  citalopram (CELEXA) tablet 20 mg (20 mg Oral Given 12/14/13 1008)  amLODipine (NORVASC) tablet 10 mg (10 mg Oral Given 12/14/13 1008)  aspirin EC tablet 81 mg (81 mg Oral Given 12/14/13 1008)  cloNIDine (CATAPRES) tablet 0.2 mg (0.2 mg Oral Given 12/14/13 1008)  docusate  sodium (COLACE) capsule 100 mg (100 mg Oral Given 12/14/13 1008)  ferrous sulfate tablet 325 mg (325 mg Oral Given 12/14/13 1008)  gabapentin (NEURONTIN) capsule 100 mg (100 mg Oral Given 12/14/13 1008)  hydrALAZINE (APRESOLINE) tablet 50 mg (50 mg Oral Given 12/14/13 1008)  isosorbide mononitrate (IMDUR) 24 hr tablet 30 mg (30 mg Oral Given 12/14/13 1008)  phenytoin (DILANTIN) ER capsule 200 mg (200 mg Oral Given 12/14/13 1008)  simvastatin (ZOCOR) tablet 40 mg (not administered)  morphine 2 MG/ML injection 2 mg (2 mg Intravenous Given 12/14/13 1153)  phenytoin (DILANTIN) ER capsule 300 mg (300 mg Oral  Given 12/14/13 0145)  nitroGLYCERIN (NITROSTAT) SL tablet 0.4 mg (not administered)  furosemide (LASIX) tablet 80 mg (not administered)  pantoprazole (PROTONIX) EC tablet 40 mg (not administered)  furosemide (LASIX) injection 60 mg (60 mg Intravenous Given 12/13/13 2213)  morphine 4 MG/ML injection 2 mg (2 mg Intravenous Given 12/13/13 2223)  0.9 %  sodium chloride infusion ( Intravenous New Bag/Given 12/14/13 0129)  gi cocktail (Maalox,Lidocaine,Donnatal) (30 mLs Oral Given 12/14/13 0520)   Blood Transfusion begun in ED Will arrange type, crossmatch and transfusion of 2 Units packed RBC   Patient Vitals for the past 24 hrs:  BP Temp Temp src Pulse Resp SpO2 Height Weight  12/14/13 1007 141/87 mmHg - - 79 - - - -  12/14/13 0639 150/84 mmHg - - 82 18 95 % - -  12/14/13 0636 145/80 mmHg - - 64 20 97 % - -  12/14/13 0630 157/97 mmHg 97.7 F (36.5 C) Oral 80 20 96 % - -  12/14/13 0556 157/93 mmHg 98.1 F (36.7 C) Oral 81 18 99 % - -  12/14/13 0534 159/94 mmHg 97.6 F (36.4 C) Oral 72 18 97 % - -  12/14/13 0434 140/90 mmHg 97.9 F (36.6 C) Oral 77 20 97 % - -  12/14/13 0334 129/86 mmHg 98 F (36.7 C) Oral 63 18 100 % - -  12/14/13 0234 151/90 mmHg 97.9 F (36.6 C) Oral 82 18 98 % - -  12/14/13 0205 164/94 mmHg - - 92 20 98 % - -  12/14/13 0100 - - - - - - 5' 4.96" (1.65 m) -  12/14/13 0053 174/96 mmHg 97.8 F (36.6 C) Oral 94 20 96 % - 173 lb 1.6 oz (78.518 kg)  12/13/13 2311 - 97.9 F (36.6 C) Oral - - - - -  12/13/13 2310 - - - 91 19 97 % - -  12/13/13 2309 154/82 mmHg - - - - - - -  12/13/13 2200 171/99 mmHg - - 82 24 96 % - -  12/13/13 2133 - - - 86 20 95 % - -  12/13/13 2130 169/96 mmHg - - 89 - - - -  12/13/13 2115 167/93 mmHg - - 80 19 96 % - -  12/13/13 2102 163/92 mmHg 99 F (37.2 C) Oral 83 16 97 % - 171 lb (77.565 kg)  12/13/13 2100 163/92 mmHg - - 82 24 97 % - -      CRITICAL CARE Performed by: Daleen Bo L Total critical care time: 40 minutes Critical care  time was exclusive of separately billable procedures and treating other patients. Critical care was necessary to treat or prevent imminent or life-threatening deterioration. Critical care was time spent personally by me on the following activities: development of treatment plan with patient and/or surrogate as well as nursing, discussions with consultants, evaluation of patient's response to treatment, examination  of patient, obtaining history from patient or surrogate, ordering and performing treatments and interventions, ordering and review of laboratory studies, ordering and review of radiographic studies, pulse oximetry and re-evaluation of patient's condition.   Medical screening examination/treatment/procedure(s) were conducted as a shared visit with non-physician practitioner(s) and myself.  I personally evaluated the patient during the encounter   Richarda Blade, MD 12/14/13 1215

## 2013-12-14 ENCOUNTER — Encounter (HOSPITAL_COMMUNITY): Payer: Self-pay | Admitting: Physician Assistant

## 2013-12-14 DIAGNOSIS — Z951 Presence of aortocoronary bypass graft: Secondary | ICD-10-CM | POA: Diagnosis not present

## 2013-12-14 DIAGNOSIS — R079 Chest pain, unspecified: Secondary | ICD-10-CM | POA: Diagnosis present

## 2013-12-14 DIAGNOSIS — I2510 Atherosclerotic heart disease of native coronary artery without angina pectoris: Secondary | ICD-10-CM | POA: Diagnosis present

## 2013-12-14 DIAGNOSIS — D509 Iron deficiency anemia, unspecified: Secondary | ICD-10-CM | POA: Diagnosis present

## 2013-12-14 DIAGNOSIS — J449 Chronic obstructive pulmonary disease, unspecified: Secondary | ICD-10-CM | POA: Diagnosis present

## 2013-12-14 DIAGNOSIS — I509 Heart failure, unspecified: Secondary | ICD-10-CM | POA: Diagnosis present

## 2013-12-14 DIAGNOSIS — E119 Type 2 diabetes mellitus without complications: Secondary | ICD-10-CM | POA: Diagnosis present

## 2013-12-14 DIAGNOSIS — J4489 Other specified chronic obstructive pulmonary disease: Secondary | ICD-10-CM | POA: Diagnosis not present

## 2013-12-14 DIAGNOSIS — N182 Chronic kidney disease, stage 2 (mild): Secondary | ICD-10-CM | POA: Diagnosis present

## 2013-12-14 DIAGNOSIS — F1410 Cocaine abuse, uncomplicated: Secondary | ICD-10-CM | POA: Diagnosis present

## 2013-12-14 DIAGNOSIS — I5033 Acute on chronic diastolic (congestive) heart failure: Principal | ICD-10-CM

## 2013-12-14 DIAGNOSIS — M109 Gout, unspecified: Secondary | ICD-10-CM | POA: Diagnosis present

## 2013-12-14 DIAGNOSIS — G4733 Obstructive sleep apnea (adult) (pediatric): Secondary | ICD-10-CM | POA: Diagnosis present

## 2013-12-14 DIAGNOSIS — I129 Hypertensive chronic kidney disease with stage 1 through stage 4 chronic kidney disease, or unspecified chronic kidney disease: Secondary | ICD-10-CM

## 2013-12-14 DIAGNOSIS — Z91199 Patient's noncompliance with other medical treatment and regimen due to unspecified reason: Secondary | ICD-10-CM | POA: Diagnosis not present

## 2013-12-14 DIAGNOSIS — E785 Hyperlipidemia, unspecified: Secondary | ICD-10-CM | POA: Diagnosis present

## 2013-12-14 DIAGNOSIS — Z9119 Patient's noncompliance with other medical treatment and regimen: Secondary | ICD-10-CM | POA: Diagnosis not present

## 2013-12-14 DIAGNOSIS — M542 Cervicalgia: Secondary | ICD-10-CM

## 2013-12-14 LAB — BASIC METABOLIC PANEL
ANION GAP: 15 (ref 5–15)
BUN: 20 mg/dL (ref 6–23)
CALCIUM: 8.3 mg/dL — AB (ref 8.4–10.5)
CO2: 21 meq/L (ref 19–32)
Chloride: 102 mEq/L (ref 96–112)
Creatinine, Ser: 1.2 mg/dL (ref 0.50–1.35)
GFR calc Af Amer: 69 mL/min — ABNORMAL LOW (ref >90)
GFR, EST NON AFRICAN AMERICAN: 60 mL/min — AB (ref >90)
Glucose, Bld: 96 mg/dL (ref 70–99)
Potassium: 4.2 mEq/L (ref 3.7–5.3)
Sodium: 138 mEq/L (ref 137–147)

## 2013-12-14 LAB — TROPONIN I
Troponin I: <0.3 ng/mL
Troponin I: <0.3 ng/mL
Troponin I: <0.3 ng/mL

## 2013-12-14 LAB — CBC
HCT: 25.6 % — ABNORMAL LOW (ref 39.0–52.0)
Hemoglobin: 7.8 g/dL — ABNORMAL LOW (ref 13.0–17.0)
MCH: 24.4 pg — ABNORMAL LOW (ref 26.0–34.0)
MCHC: 30.5 g/dL (ref 30.0–36.0)
MCV: 80 fL (ref 78.0–100.0)
Platelets: 345 10*3/uL (ref 150–400)
RBC: 3.2 MIL/uL — AB (ref 4.22–5.81)
RDW: 18.1 % — AB (ref 11.5–15.5)
WBC: 4 10*3/uL (ref 4.0–10.5)

## 2013-12-14 LAB — HIV ANTIBODY (ROUTINE TESTING W REFLEX): HIV: NONREACTIVE

## 2013-12-14 MED ORDER — CLONIDINE HCL 0.2 MG PO TABS
0.2000 mg | ORAL_TABLET | Freq: Two times a day (BID) | ORAL | Status: DC
  Administered 2013-12-14 – 2013-12-15 (×4): 0.2 mg via ORAL
  Filled 2013-12-14 (×5): qty 1

## 2013-12-14 MED ORDER — PANTOPRAZOLE SODIUM 40 MG PO TBEC
40.0000 mg | DELAYED_RELEASE_TABLET | Freq: Two times a day (BID) | ORAL | Status: DC
  Administered 2013-12-14 – 2013-12-15 (×3): 40 mg via ORAL
  Filled 2013-12-14 (×3): qty 1

## 2013-12-14 MED ORDER — GI COCKTAIL ~~LOC~~
30.0000 mL | Freq: Once | ORAL | Status: AC
  Administered 2013-12-14: 30 mL via ORAL
  Filled 2013-12-14: qty 30

## 2013-12-14 MED ORDER — CITALOPRAM HYDROBROMIDE 20 MG PO TABS
20.0000 mg | ORAL_TABLET | Freq: Every day | ORAL | Status: DC
  Administered 2013-12-14 – 2013-12-15 (×2): 20 mg via ORAL
  Filled 2013-12-14 (×2): qty 1

## 2013-12-14 MED ORDER — NITROGLYCERIN 0.4 MG SL SUBL
0.4000 mg | SUBLINGUAL_TABLET | SUBLINGUAL | Status: DC | PRN
  Administered 2013-12-15 (×2): 0.4 mg via SUBLINGUAL
  Filled 2013-12-14: qty 1

## 2013-12-14 MED ORDER — FUROSEMIDE 10 MG/ML IJ SOLN
40.0000 mg | Freq: Every day | INTRAMUSCULAR | Status: DC
  Administered 2013-12-14: 40 mg via INTRAVENOUS
  Filled 2013-12-14: qty 4

## 2013-12-14 MED ORDER — ASPIRIN EC 81 MG PO TBEC
81.0000 mg | DELAYED_RELEASE_TABLET | Freq: Every day | ORAL | Status: DC
  Administered 2013-12-14 – 2013-12-15 (×2): 81 mg via ORAL
  Filled 2013-12-14 (×2): qty 1

## 2013-12-14 MED ORDER — GABAPENTIN 100 MG PO CAPS
100.0000 mg | ORAL_CAPSULE | Freq: Two times a day (BID) | ORAL | Status: DC
  Administered 2013-12-14 – 2013-12-15 (×4): 100 mg via ORAL
  Filled 2013-12-14 (×5): qty 1

## 2013-12-14 MED ORDER — HYDRALAZINE HCL 50 MG PO TABS
50.0000 mg | ORAL_TABLET | Freq: Three times a day (TID) | ORAL | Status: DC
  Administered 2013-12-14 – 2013-12-15 (×5): 50 mg via ORAL
  Filled 2013-12-14 (×7): qty 1

## 2013-12-14 MED ORDER — ISOSORBIDE MONONITRATE ER 30 MG PO TB24
30.0000 mg | ORAL_TABLET | Freq: Every day | ORAL | Status: DC
  Administered 2013-12-14 – 2013-12-15 (×2): 30 mg via ORAL
  Filled 2013-12-14 (×2): qty 1

## 2013-12-14 MED ORDER — AMLODIPINE BESYLATE 10 MG PO TABS
10.0000 mg | ORAL_TABLET | Freq: Every day | ORAL | Status: DC
  Administered 2013-12-14 – 2013-12-15 (×2): 10 mg via ORAL
  Filled 2013-12-14 (×2): qty 1

## 2013-12-14 MED ORDER — DOCUSATE SODIUM 100 MG PO CAPS
100.0000 mg | ORAL_CAPSULE | Freq: Two times a day (BID) | ORAL | Status: DC
  Administered 2013-12-14: 100 mg via ORAL
  Filled 2013-12-14 (×4): qty 1

## 2013-12-14 MED ORDER — PHENYTOIN SODIUM EXTENDED 100 MG PO CAPS
200.0000 mg | ORAL_CAPSULE | Freq: Every day | ORAL | Status: DC
  Administered 2013-12-14 – 2013-12-15 (×2): 200 mg via ORAL
  Filled 2013-12-14 (×2): qty 2

## 2013-12-14 MED ORDER — MORPHINE SULFATE 2 MG/ML IJ SOLN
2.0000 mg | INTRAMUSCULAR | Status: DC | PRN
  Administered 2013-12-14 – 2013-12-15 (×3): 2 mg via INTRAVENOUS
  Filled 2013-12-14 (×4): qty 1

## 2013-12-14 MED ORDER — INFLUENZA VAC SPLIT QUAD 0.5 ML IM SUSY
0.5000 mL | PREFILLED_SYRINGE | INTRAMUSCULAR | Status: AC
  Administered 2013-12-15: 0.5 mL via INTRAMUSCULAR
  Filled 2013-12-14: qty 0.5

## 2013-12-14 MED ORDER — ENOXAPARIN SODIUM 40 MG/0.4ML ~~LOC~~ SOLN: 40.0000 mg | SUBCUTANEOUS | Status: DC

## 2013-12-14 MED ORDER — SIMVASTATIN 40 MG PO TABS
40.0000 mg | ORAL_TABLET | Freq: Every evening | ORAL | Status: DC
  Administered 2013-12-14: 40 mg via ORAL
  Filled 2013-12-14 (×2): qty 1

## 2013-12-14 MED ORDER — FUROSEMIDE 80 MG PO TABS
80.0000 mg | ORAL_TABLET | Freq: Every day | ORAL | Status: DC
  Administered 2013-12-14 – 2013-12-15 (×2): 80 mg via ORAL
  Filled 2013-12-14 (×2): qty 1

## 2013-12-14 MED ORDER — FERROUS SULFATE 325 (65 FE) MG PO TABS
325.0000 mg | ORAL_TABLET | Freq: Every day | ORAL | Status: DC
  Administered 2013-12-14 – 2013-12-15 (×2): 325 mg via ORAL
  Filled 2013-12-14 (×2): qty 1

## 2013-12-14 MED ORDER — PHENYTOIN SODIUM EXTENDED 100 MG PO CAPS
300.0000 mg | ORAL_CAPSULE | Freq: Every day | ORAL | Status: DC
  Administered 2013-12-14 (×2): 300 mg via ORAL
  Filled 2013-12-14 (×3): qty 3

## 2013-12-14 NOTE — Plan of Care (Signed)
Problem: Phase I Progression Outcomes Goal: Anginal pain relieved Outcome: Progressing Patient complained of chest pain in am at a 8 on a scale 1-10. Described pain as sharp and constant. Attempted to give patient medication but patient was sleeping.  Patient was given morphine IV once awake. Stated pain was relieved. Will continue to monitor.

## 2013-12-14 NOTE — Consult Note (Signed)
[facility] Consult: 10:06 AM 12/14/2013  LOS: 1 day    Referring Provider: Dr Hulen Luster  Primary Care Physician:  [name] Primary Gastroenterologist:  Althia Forts, seen by Velora Heckler Dr Ardis Hughs, Dr Benson Norway, Dr Collene Mares in 2014.  [place] MDs in 2012.     Reason for Consultation:  Recurrent anemia.    HPI: Charles [name] is a 68 y.o. male. Hx diastolic and systolic heart failure, EF 35%.  CAD s/p 3V CABG '09, COPD, HTN, CKD stage 2, DM2. Medical non-compliance. Med list includes po  Iron tid. Frequent admissions.  Pt had melena in 07/2012.  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  Second (EGD) showed spotty patch of bleeding in mid gastric body, this also ablated with APC.  Admissions 9/9 - 9/12 with hypertensive urgency, volume overaload in setting of med non-compliance. Ruled out for MI.  Admitted 9/20 - 9/21 with atypical chest pain in setting of recent cocaine use, med non-compliance.  Ruled out for MI.  Hgb of 7.3, baseline is 7 to 9.   Now readmitted with pain in upper mid abdomen from epigastrium to above the navel. This is not worsened by po intake, breathing in or movement.  No nausea or change in appetite.  Having daily brown BMs. Was having swelling in legs, stable dyspnea. + cough and chest burning. + swelling in legs and arms. Weight up 2# from 9/21.  Hgb clocked in at 6.4 and he was transfused with one unit of PRBCs to 7.8. MCV low at 77 (chronically low MCV).  Iron studies not obtained.  Stool not yet hemocculted.  Chest x ray shows mild failure.  No abdominal imaging to date.  LFTs normal. pBNP of 6,456. UDS once again + for THC, cocaine, opiates.  He admits to smoking crack on 10/21 or 10/22.  Says he is not taking pain meds, including NSAIDs at home.    Pain is better today but not resolved. He is asking to be fed, having been kept NPO overnight.   Pt says he is taking the po Iron TID but historically he is not reliable in taking meds and says he takes his meds, when in reality he does not.  Denies EGD or colonoscopies since 07/2012.      Past Medical History  Diagnosis Date  . Iron deficiency anemia   . Chronic combined systolic and diastolic CHF, NYHA class 2     a. 11/2013 Echo: EF 40-45%, basl-mid inflat AK, Gr 2 DD.  [name] Hyperlipidemia LDL goal <70   . Obstructive sleep apnea     a. not on home CPAP  . Ischemic cardiomyopathy     a. 11/2013 Echo: EF 40-45%, basl-mid inflat AK, Gr 2 DD, mild AI, mod dil LA/RA, mod reduced RV fxn, PASP 55mmHg.  [name] Homelessness   . Moderate to severe pulmonary hypertension 03/2010    a. PA peak pressure of 76 mmHg (per 2D Echo 03/2010)  . Polysubstance abuse     a. cocaine, THC  . AV  malformation of gastrointestinal tract     a. w/ h/o GIB.  [name] Family history of early CAD   . DDD (degenerative disc disease), lumbar   . Peripheral vascular disease   . Seizure disorder   . Seizures   . Hypertension   . CAD (coronary artery disease)     a. s/p 3-vessel CABG (12/2007) // 100% RCA stenosis with collaterals from left system. Severe bifurcation lesions of proxima CXA and OM. Moderate LAD disease - followed by Dr. Wyline Copas in [facility];  b. 11/2013 Myoview: Large fixed inf defect w/o ischemia, EF 35%.  . Diabetes   . Chronic kidney disease (CKD), stage III (moderate)     a. BL SCr 1.5-1.6  . Renal artery stenosis     Past Surgical History  Procedure Laterality Date  . Apc  03/2010    To treat small bowel AVMs  . Esophagogastroduodenoscopy N/A 08/14/2012    Procedure: ESOPHAGOGASTRODUODENOSCOPY (EGD);  Surgeon: [name], MD;  Location: [facility] ENDOSCOPY;  Service: Endoscopy;  Laterality: N/A;  . Hot hemostasis N/A 08/14/2012    Procedure: HOT HEMOSTASIS (ARGON PLASMA COAGULATION/BICAP);  Surgeon: [name], MD;  Location[facility] ENDOSCOPY;  Service: Endoscopy;  Laterality: N/A;  . Esophagogastroduodenoscopy (egd) with propofol N/A 08/04/2013    Procedure: ESOPHAGOGASTRODUODENOSCOPY (EGD) WITH PROPOFOL;  Surgeon: [name], MD;  Location: [facility] ENDOSCOPY;  Service: Endoscopy;  Laterality: N/A;  . Coronary artery bypass graft  12/2007    Prior to Admission medications   Medication Sig Start Date End Date Taking? Authorizing Provider  amLODipine (NORVASC) 10 MG tablet Take 10 mg by mouth daily.   Yes Historical Provider, MD  aspirin EC 81 MG tablet Take 81 mg by mouth daily.   Yes Historical Provider, MD  citalopram (CELEXA) 20 MG tablet Take 20 mg by mouth daily.   Yes Historical Provider, MD  cloNIDine (CATAPRES) 0.2 MG tablet Take 0.2 mg by mouth 2 (two) times daily.   Yes Historical Provider, MD  docusate sodium (COLACE) 100 MG capsule Take 100 mg by mouth 2 (two) times daily.   Yes Historical Provider, MD  ferrous sulfate 325 (65 FE) MG tablet Take 325 mg by mouth daily.    Yes Historical Provider, MD  furosemide (LASIX) 80 MG tablet Take 80 mg by mouth daily.   Yes Historical Provider, MD  gabapentin (NEURONTIN) 100 MG capsule Take 100 mg by mouth 2 (two) times daily.   Yes Historical Provider, MD  hydrALAZINE (APRESOLINE) 50 MG tablet Take 50 mg by mouth 3 (three) times daily.   Yes Historical Provider, MD  isosorbide mononitrate (IMDUR) 30 MG 24 hr tablet Take 30 mg by mouth daily.   Yes Historical Provider, MD  pantoprazole (PROTONIX) 40 MG tablet Take 40 mg by mouth 2 (two) times daily.   Yes Historical Provider, MD  phenytoin (DILANTIN) 100 MG ER capsule Take 200-300 mg by mouth 2 (two) times daily. 200mg  in the morning, 300mg  in the evening   Yes Historical Provider, MD  simvastatin (ZOCOR) 40 MG tablet Take 40 mg by mouth every evening.   Yes Historical Provider, MD    Scheduled Meds: . amLODipine  10 mg Oral Daily  . aspirin EC  81 mg Oral Daily  . citalopram  20 mg Oral Daily   . cloNIDine  0.2 mg Oral BID  . docusate sodium  100 mg Oral BID  . ferrous sulfate  325 mg Oral Daily  . furosemide  40 mg Intravenous Daily  .  gabapentin  100 mg Oral BID  . hydrALAZINE  50 mg Oral TID  . isosorbide mononitrate  30 mg Oral Daily  . phenytoin  200 mg Oral Daily  . phenytoin  300 mg Oral QHS  . simvastatin  40 mg Oral QPM   Infusions:   PRN Meds: morphine injection, nitroGLYCERIN   Allergies as of 12/13/2013 - Review Complete 12/13/2013  Allergen Reaction Noted  . Motrin [ibuprofen] Other (See Comments) 05/16/2010  . Tylenol [acetaminophen] Other (See Comments) 05/16/2010    Family History  Problem Relation Age of Onset  . Heart disease Mother     unknown type  . Heart disease Father 58    died of MI at 87yo  . Heart disease Paternal Grandfather 35    died of MI  . Heart disease    . Heart disease Brother 30    History   Social History  . Marital Status: Single    Spouse Name: N/A    Number of Children: 2  . Years of Education: N/A   Occupational History  . Unemployed     previously worked in [employer]  .     Social History Main Topics  . Smoking status: Current Some Day Smoker -- 0.25 packs/day for 56 years    Types: Cigarettes  . Smokeless tobacco: Never Used  . Alcohol Use: Yes     Comment: occasionally drinks, a few times a month  . Drug Use: 1.00 per week    Special: Cocaine, Marijuana     Comment: uses cocaine once/wk at least.  . Sexual Activity: No   Other Topics Concern  . Not on file   Social History Narrative   Lives in [place]. Originally from [place], has been in the [place] since 1980s      **11/2013 - says that he lives with dtr in [facility].**                      REVIEW OF SYSTEMS: Constitutional:  Generally low energy and weakness.  ENT:  No nose bleeds Pulm:  Stable SOB CV:  No palpitations, and per HPI.   GU:  No hematuria, no frequency GI:  Per HPI.  No dysphagia Heme:  Chronic  anemia   Transfusions:  Yes in past, in 2014 and on other occasions Neuro:  No headaches, no peripheral tingling or numbness Derm:  No itching, no rash or sores.  Endocrine:  No sweats or chills.  No polyuria or dysuria Immunization:  Not queried.  Travel:  None beyond local counties in last few months.    PHYSICAL EXAM: Vital signs in last 24 hours: Filed Vitals:   12/14/13 0639  BP: 150/84  Pulse: 82  Temp:   Resp: 18   Wt Readings from Last 3 Encounters:  12/14/13 78.518 kg (173 lb 1.6 oz)  12/08/13 77.7 kg (171 lb 4.8 oz)  12/01/13 75.5 kg (166 lb 7.2 oz)    General: obese, comfortable AAM who is complaining about lack of attention and service.  Head:  No asymmetry or swelling Eyes:  No icterus or pallor Ears:  Not HOH  Nose:  No congestion, no discharge Mouth:  Clear, no blood, moist MM Neck:  No mass, no JVD Lungs:  Crackles in left base, no dyspnea or cough Heart: RRR.  No MRG Abdomen:  Soft, obese/protuberant.  Slight tenderness in upper abdomen, no guard or rebound.   Rectal: deferred   Musc/Skeltl: no joint  swelling or contracture Extremities:  No CCE.   Neurologic:  Oriented x 3.  Moves all 4s, no limb weakness.  No tremor Skin:  No rash.  Multiple healing punctate lesions/small ulcers on legs.  Tattoos:  None seen Nodes:  No cervical adenopathy.    Psych:  A bit agitated.  Fully alert.    Intake/Output from previous day: 09/26 0701 - 09/27 0700 In: 12.5 [Blood:12.5] Out: 200 [Urine:200] Intake/Output this shift:    LAB RESULTS:  Recent Labs  12/13/13 2100 12/14/13 0801  WBC 6.0 4.0  HGB 6.4* 7.8*  HCT 21.1* 25.6*  PLT 361 345   BMET Lab Results  Component Value Date   NA 138 12/14/2013   NA 138 12/13/2013   NA 135* 12/08/2013   K 4.2 12/14/2013   K 4.5 12/13/2013   K 4.5 12/08/2013   CL 102 12/14/2013   CL 104 12/13/2013   CL 99 12/08/2013   CO2 21 12/14/2013   CO2 20 12/13/2013   CO2 24 12/08/2013   GLUCOSE 96 12/14/2013   GLUCOSE 89  12/13/2013   GLUCOSE 97 12/08/2013   BUN 20 12/14/2013   BUN 20 12/13/2013   BUN 23 12/08/2013   CREATININE 1.20 12/14/2013   CREATININE 1.18 12/13/2013   CREATININE 1.43* 12/08/2013   CALCIUM 8.3* 12/14/2013   CALCIUM 8.2* 12/13/2013   CALCIUM 8.0* 12/08/2013   LFT  Recent Labs  12/13/13 2100  PROT 7.3  ALBUMIN 3.0*  AST 27  ALT 14  ALKPHOS 100  BILITOT 0.3   PT/INR Lab Results  Component Value Date   INR 1.23 12/13/2013   INR 1.06 08/02/2013   INR 1.24 07/15/2013   Lipase     Component Value Date/Time   LIPASE 45 12/07/2013 0543    Drugs of Abuse     Component Value Date/Time   LABOPIA POSITIVE* 12/13/2013 2310   COCAINSCRNUR POSITIVE* 12/13/2013 2310   LABBENZ NONE DETECTED 12/13/2013 2310   AMPHETMU NONE DETECTED 12/13/2013 2310   THCU POSITIVE* 12/13/2013 2310   LABBARB NONE DETECTED 12/13/2013 2310     RADIOLOGY STUDIES: Dg Chest 2 View  12/13/2013   CLINICAL DATA:  Shortness of breath.  Hypertension.  EXAM: CHEST  2 VIEW  COMPARISON:  12/07/2013  FINDINGS: Shallow inspiration. Postoperative change in the mediastinum. Cardiac enlargement with pulmonary vascular congestion. Mild hazy interstitial pattern suggest mild edema. No airspace disease or consolidation. No blunting of costophrenic angles. No pneumothorax.  IMPRESSION: Cardiac enlargement with pulmonary vascular congestion and mild interstitial edema. Similar to previous study.   Electronically Signed   By: Lucienne Capers M.D.   On: 12/13/2013 22:51    ENDOSCOPIC STUDIES: 08/14/12   EGD  Dr Collene Mares INDICATIONS: Chest pain, melena, and acute post hemorrhagic anemia. IMPRESSION: Small patch of mucosa with spotty bleeding in the  midbody [not typical of AVM]-ablated with an APC; otherwise normal  esophagogastroduodenoscopy.  RECOMMENDATIONS: 1. Anti-reflux regimen to be followed.  2. Avoid NSAIDS for now.  3. Hold Aspirin for now.  4. Clear liquid diet.  5. Check PT/INR.  08/04/12  EGD  Dr Fuller Plan INDICATIONS:1.  melenic bleeding. ENDOSCOPIC IMPRESSION:  1. AV malformation in the antrum; APC ablation performed.  2. The remainder of the small bowel enteroscopy exam was normal  RECOMMENDATIONS:  1. avoid ASA/NSAIDS  2012 Enteroscopy For FOB+, anemia and negative colon and EGD 3 AVMs in SB were APC ablated.   2012  Capsule endoscopy 1)COMPLETE STUDY, GOOD PREP 2)ACTIVE OOZING OF BLOOD  IN THE PROXIMAL SMALL BOWEL, UNABLE TO IDENTIFY SOURCE. BLOOD FIRST SEEN ABOUT 35 MINUTES BEYOND FIRST DUODENAL IMAGE.   2012 EGD  Negative  2012 Colonoscopy Normal except internal hemorrhoids.    IMPRESSION:   *  Acute on chronic anemia.  With low MCV, I doubt he is taking the po Iron TID.   S/p PRBC x one.  Might benefit for parenteral iron infusion.  Unfortunately did not obtain pre transfusion Iron studies to determine how much iron should be dosed.  Can consult pharmacy for suggested dosing if you pursue infusion.   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 Treated with APC as recently as 07/2012  *  CHF  *  On going crack cocaine and THC abuse.    *  Upper abdominal pain without emesis.  Improving.  If worsens may warrant a CT scan abdomen (none since 2012).     PLAN:     *  No plans to endoscope pt so I placed him on heart healthy diet.    Azucena Freed  12/14/2013, 10:06 AM Pager: [phone]     ________________________________________________________________________  [facility] MD note:  I personally examined the patient, reviewed the data and agree with the assessment and plan described above.  He needs to stay on iron pills, one pill once daily. He should take one PPI twice daily.  He has had no overt GI bleeding and so no reason for repeating any endoscopic procedures at this point.  If he has overt bleeding, would reconsider that however.  Most important is that he try to stop smoking crack, other drugs.    Owens Loffler, MD [facility] Pager [phone]

## 2013-12-14 NOTE — Progress Notes (Signed)
Utilization Review Completed.   Chelcey Caputo, RN, BSN Nurse Case Manager  

## 2013-12-14 NOTE — H&P (Signed)
Date: 12/14/2013  Patient name: Charles [name]  Medical record number: [id number]  Date of birth: 03-20-1946   I have seen and evaluated Charles [name] and discussed their care with the Residency Team. : Mr Barg is a 68 year old man with a history of HFrEF (EF 40-45% 11/2013), CAD s/p 3VCABG 2009, anemia with h/o GI bleed (hgB 8 since May, higher prior to May)), and cocaine abuse who presents with chest pain. He was last admitted for the same above reasons from 9/20-21. Pt stopped taking his home medications 3 days ago (24th) b/c he did not feel well and last used cocaine 4 days ago. Today around 12:00pm he started coughing with sputum production that was white w/ streaking. The coughing caused him to be SOB. He also starting experiencing pain from his neck down to his abdomen which he describes as a sore feeling. He also has rt sided chest pain that is sharp in nature and centered around his sternal area. He rates chest pain as 8/10 and constant. He also states that his chest burns when he breaths. He states that this chest pain is different from his prior episodes due to the pain that starts at his neck and occurs on both sides of his chest. He also reports he starts coughing when he has fluid build up. Pt reports generalized abdominal pain that is dull in nature and he rates it as 10/10 in severity. He has noticed swelling in his abdomen and legs. He reports fatigue and weakness. He does not see the connection btw not taking meds and his sxs and cards has documented that he does not see connection btw cocaine use and his medical issues.  This AM, he feels a bit better in that the cough is a bit less but still present. Feels ABD is still bloated. HgB up from 6.4 to 7.8 after one unit. GI has been consulted.   On exam, he is jovial and making jokes. Isolation stethoscope used : HRRR, lungs crackles at bases B but good air flow. ABD + BS, soft, not especially protuberant. + 1 LE edema  B.  Assessment and Plan: I have seen and evaluated the patient as outlined above. I agree with the formulated Assessment and Plan as detailed in the residents' admission note, with the following changes:   1. Acute on chronic iron deficient anemia - W/U so far : 07/2013 : sm bowel endoscopy : AV malformation in the antrum; APC ablation 07/2013 EGD : sm patch of mucosa with spotty bleeding in mid body and not typical of AVM - ablated.  Pt's HgB appropriate;y responded to 1 unit and HgB now almost at May baseline. Guiac + in ED. Likely slow GI loss of blood as sources were ID'd in May W/U. Cont FeSO4. Ask GI to eval pt.   2. Acute on chronic diastolic and systolic ischemic heart failure - weight is up from 165 on 9/19 to 173. Pt has not been taking cardiac meds inc lasix. He appears to be slightly vol overloaded (weight and crackles) but with GI bleed. Cr can tolerate diuresis but will need to be a bit conservative until bleeding resolved. Lasix 40 IV. Follow I/O and exam.   3. CP - R/O for ACS with Trop and EKG. Needs to stop cocaine.   4. HTN - non-complaint with meds so clonidine not best med. But Ace I stopped 2/2 renal fxn, BB CI 2/2 cocaine, hydral could be increased but TID med so doubt  compliance. No great solution.   Bartholomew Crews, MD 9/27/201510:12 AM

## 2013-12-14 NOTE — Progress Notes (Addendum)
Subjective: Charles [name] is ok this morning; he reports continuing general pain throughout his chest and abdomen. He states that his current living environment is not conducive to him taking his medications and is not conducive to him stopping his cocaine use. Last used Monday or Tuesday.  Objective: Vital signs in last 24 hours: Filed Vitals:   12/14/13 0556 12/14/13 0630 12/14/13 0636 12/14/13 0639  BP: 157/93 157/97 145/80 150/84  Pulse: 81 80 64 82  Temp: 98.1 F (36.7 C) 97.7 F (36.5 C)    TempSrc: Oral Oral    Resp: 18 20 20 18   Height:      Weight:      SpO2: 99% 96% 97% 95%   Weight change:   Intake/Output Summary (Last 24 hours) at 12/14/13 0757 Last data filed at 12/14/13 0219  Gross per 24 hour  Intake   12.5 ml  Output    200 ml  Net -187.5 ml   Physical Exam: Appearance: curled on side in bed, in NAD HEENT: AT/[place], mild scleral icterus, EOMi, no lymphadeopathy Heart: RRR, normal S1S2, no MRG Lungs: CTAB with RLL crackles Abdomen: BS+, distended, epigastric tenderness to deep palpation Musculoskeletal: normal range of motion Extremities: no edema BLE Neurologic: A&Ox3, sensation and strength grossly intact Skin: no rashes or lesions  Lab Results: Basic Metabolic Panel:  Recent Labs Lab 12/08/13 0101 12/13/13 2100  NA 135* 138  K 4.5 4.5  CL 99 104  CO2 24 20  GLUCOSE 97 89  BUN 23 20  CREATININE 1.43* 1.18  CALCIUM 8.0* 8.2*  MG 1.8  --    Liver Function Tests:  Recent Labs Lab 12/13/13 2100  AST 27  ALT 14  ALKPHOS 100  BILITOT 0.3  PROT 7.3  ALBUMIN 3.0*   CBC:  Recent Labs Lab 12/08/13 0908 12/13/13 2100  WBC 4.3 6.0  NEUTROABS  --  4.1  HGB 7.3* 6.4*  HCT 24.2* 21.1*  MCV 77.3* 77.0*  PLT 424* 361   Cardiac Enzymes:  Recent Labs Lab 12/07/13 1335 12/07/13 1807 12/08/13 0101  TROPONINI <0.30 <0.30 <0.30   BNP:  Recent Labs Lab 12/13/13 2100  PROBNP 6456.0*   CBG:  Recent Labs Lab 12/07/13 1701  12/07/13 2130 12/08/13 0816 12/08/13 1219  GLUCAP 141* 79 87 107*   Hemoglobin A1C:  Recent Labs Lab 12/07/13 1440  HGBA1C 6.0*   Thyroid Function Tests:  Recent Labs Lab 12/07/13 1335  TSH 2.010   Coagulation:  Recent Labs Lab 12/13/13 2313  LABPROT 15.5*  INR 1.23   Urine Drug Screen: Drugs of Abuse     Component Value Date/Time   LABOPIA POSITIVE* 12/13/2013 2310   COCAINSCRNUR POSITIVE* 12/13/2013 2310   LABBENZ NONE DETECTED 12/13/2013 2310   AMPHETMU NONE DETECTED 12/13/2013 2310   THCU POSITIVE* 12/13/2013 2310   LABBARB NONE DETECTED 12/13/2013 2310     Micro Results: Recent Results (from the past 240 hour(s))  MRSA PCR SCREENING     Status: Abnormal   Collection Time    12/07/13  2:17 PM      Result Value Ref Range Status   MRSA by PCR POSITIVE (*) NEGATIVE Final   Comment:            The GeneXpert MRSA Assay (FDA     approved for NASAL specimens     only), is one component of a     comprehensive MRSA colonization     surveillance program. It is not  intended to diagnose MRSA     infection nor to guide or     monitor treatment for     MRSA infections.     RESULT CALLED TO, READ BACK BY AND VERIFIED WITH:     Lemmie Evens [phone] SHIPMAN M   Studies/Results: Dg Chest 2 View  12/13/2013   CLINICAL DATA:  Shortness of breath.  Hypertension.  EXAM: CHEST  2 VIEW  COMPARISON:  12/07/2013  FINDINGS: Shallow inspiration. Postoperative change in the mediastinum. Cardiac enlargement with pulmonary vascular congestion. Mild hazy interstitial pattern suggest mild edema. No airspace disease or consolidation. No blunting of costophrenic angles. No pneumothorax.  IMPRESSION: Cardiac enlargement with pulmonary vascular congestion and mild interstitial edema. Similar to previous study.   Electronically Signed   By: Lucienne Capers M.D.   On: 12/13/2013 22:51   EKG: unchanged from previous tracings, NSR, prolonged qTc  Medications: I have reviewed the  patient's current medications. Scheduled Meds: . amLODipine  10 mg Oral Daily  . aspirin EC  81 mg Oral Daily  . citalopram  20 mg Oral Daily  . cloNIDine  0.2 mg Oral BID  . docusate sodium  100 mg Oral BID  . ferrous sulfate  325 mg Oral Daily  . furosemide  40 mg Intravenous Daily  . gabapentin  100 mg Oral BID  . hydrALAZINE  50 mg Oral TID  . isosorbide mononitrate  30 mg Oral Daily  . phenytoin  200 mg Oral Daily  . phenytoin  300 mg Oral QHS  . simvastatin  40 mg Oral QPM   Continuous Infusions:  PRN Meds:.morphine injection, nitroGLYCERIN Assessment/Plan: Active Problems:   Chest pain This 68 yo man with HFpEF, CAD (s/p 3v CABG in 2009), anemia (h/o antral AVM and GI bleed, ablated in 07/2013), CKD, DMII and cocaine abuse is here with abdominal distension, vague abdomina/chest pain and anemia, all in the setting of medication noncompliance and active cocaine use. His EKG and CXR are unchanged from discharge of his prior hospitalization last week but his BNP is elevated and his hemoglobin has dropped. He has had extensive cardiac workups in this hospital but continues to reappear with chest pain; he is interested in assisted living placement.  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 EGD 07/2013 revealed an antral AVM that was ablated. Iron studies previously indicated iron deficiency anemia with no obvious chronic bleeding source. He is on home iron supplementation; unclear whether he has been taking it. On admission, Hb 6.4 down from 7.3 on last admission. FOBT +. Baseline hb 7-9. + Orthostatics. Was transfused 1 unit overnight. Now trending back up to 7.8. - GI to see the patient today - Trend CBC - Trend orthostatics  Iron Deficiency Anemia: Baseline hemoglobin between 7-9. Recent anemia panel revealed iron level of 16, ferritin of 34, with a TIBC of 382. Folate level was normal.  - Continue with iron supplementation   Acute on Chronic  Diastolic Heart Failure: echo on 11/27/13 showed reduced EF 40-45% (down from 50-55% with grade 2 diastolic dysfunction in 10/1827) with EF 35% on lexiscan 11/29/13. Patient is noncompliant with his lasix (80mg  daily at home); last took it on 9/24. ProBNP on last admission >5000-->1908; now back to 6456.  - Tele - Restarting lasix 40mg  IV - Restarting home hydralazine for afterload reduction - Strict Is&Os - Daily weights  Pain from neck to pelvis: Negative cardiac workup last week, i-stat troponin  negative, repeat EKG unchanged. Some epigastric tenderness on exam. Will attempt to better elicit type of pain; difficult for patient to describe. - Continue aspirin, imdur, ccb, statin and morphine for pain management - GI cocktail given - Trend troponins - GI to see the patient today  Hypertension: less elevated on this admission; likely a result of medication noncompliance and cocaine use. Home medications are amlodipine 10 mg daily, clonidine 0.2 mg twice daily, hydralazine 50mg  TID and lasix 80 mg daily. Last took these on 9/24.  - Restart home medications - Avoid BB in context of cocaine abuse  Cocaine Abuse: counseling patient on cessation; patient states that his home environment is not conducive to quitting (he currently lives with his daughter). Is interested in returning to assisted living.  - Social work consulted for assistance with placement; this could be good for his medication compliance as well  CKD Stage II: renal function improved with creatinine 1.18 (down from 1.43). Baseline likely ~1.2.  - trend BMP  Diet:  - NPO until GI consult  DVT Ppx:  - SCDs  Dispo: Disposition is deferred at this time, awaiting improvement of current medical problems.  Anticipated discharge in approximately 1-2 day(s).   The patient does have a current PCP [name]) and does not need an [facility] hospital follow-up appointment after discharge.  The patient does have transportation limitations  that hinder transportation to clinic appointments.  .Services Needed at time of discharge: Y = Yes, Blank = No PT:   OT:   RN:   Equipment:   Other:     LOS: 1 day   Drucilla Schmidt, MD 12/14/2013, 7:57 AM

## 2013-12-15 DIAGNOSIS — I5033 Acute on chronic diastolic (congestive) heart failure: Secondary | ICD-10-CM | POA: Diagnosis not present

## 2013-12-15 LAB — TYPE AND SCREEN
ABO/RH(D): O POS
Antibody Screen: NEGATIVE
Unit division: 0

## 2013-12-15 LAB — CBC
HCT: 24.3 % — ABNORMAL LOW (ref 39.0–52.0)
HEMOGLOBIN: 7.6 g/dL — AB (ref 13.0–17.0)
MCH: 24.1 pg — ABNORMAL LOW (ref 26.0–34.0)
MCHC: 31.3 g/dL (ref 30.0–36.0)
MCV: 77.1 fL — ABNORMAL LOW (ref 78.0–100.0)
PLATELETS: 375 10*3/uL (ref 150–400)
RBC: 3.15 MIL/uL — ABNORMAL LOW (ref 4.22–5.81)
RDW: 18.1 % — ABNORMAL HIGH (ref 11.5–15.5)
WBC: 5.2 10*3/uL (ref 4.0–10.5)

## 2013-12-15 NOTE — Discharge Instructions (Signed)
It is very important that you take your lasix and your iron supplements at home.   Furosemide tablets What is this medicine? FUROSEMIDE (fyoor [place] se mide) is a diuretic. It helps you make more urine and to lose salt and excess water from your body. This medicine is used to treat high blood pressure, and edema or swelling from heart, kidney, or liver disease. This medicine may be used for other purposes; ask your health care provider or pharmacist if you have questions. COMMON BRAND NAME(S): Delone, Lasix What should I tell my health care provider before I take this medicine? They need to know if you have any of these conditions: -abnormal blood electrolytes -diarrhea or vomiting -gout -heart disease -kidney disease, small amounts of urine, or difficulty passing urine -liver disease -an unusual or allergic reaction to furosemide, sulfa drugs, other medicines, foods, dyes, or preservatives -pregnant or trying to get pregnant -breast-feeding How should I use this medicine? Take this medicine by mouth with a glass of water. Follow the directions on the prescription label. You may take this medicine with or without food. If it upsets your stomach, take it with food or milk. Do not take your medicine more often than directed. Remember that you will need to pass more urine after taking this medicine. Do not take your medicine at a time of day that will cause you problems. Do not take at bedtime. Talk to your pediatrician regarding the use of this medicine in children. While this drug may be prescribed for selected conditions, precautions do apply. Overdosage: If you think you have taken too much of this medicine contact a poison control center or emergency room at once. NOTE: This medicine is only for you. Do not share this medicine with others. What if I miss a dose? If you miss a dose, take it as soon as you can. If it is almost time for your next dose, take only that dose. Do not take double or  extra doses. What may interact with this medicine? -aspirin and aspirin-like medicines -certain antibiotics -chloral hydrate -cisplatin -cyclosporine -digoxin -diuretics -laxatives -lithium -medicines for blood pressure -medicines that relax muscles for surgery -methotrexate -NSAIDs, medicines for pain and inflammation like ibuprofen, naproxen, or indomethacin -phenytoin -steroid medicines like prednisone or cortisone -sucralfate This list may not describe all possible interactions. Give your health care provider a list of all the medicines, herbs, non-prescription drugs, or dietary supplements you use. Also tell them if you smoke, drink alcohol, or use illegal drugs. Some items may interact with your medicine. What should I watch for while using this medicine? Visit your doctor or health care professional for regular checks on your progress. Check your blood pressure regularly. Ask your doctor or health care professional what your blood pressure should be, and when you should contact him or her. If you are a diabetic, check your blood sugar as directed. You may need to be on a special diet while taking this medicine. Check with your doctor. Also, ask how many glasses of fluid you need to drink a day. You must not get dehydrated. You may get drowsy or dizzy. Do not drive, use machinery, or do anything that needs mental alertness until you know how this drug affects you. Do not stand or sit up quickly, especially if you are an older patient. This reduces the risk of dizzy or fainting spells. Alcohol can make you more drowsy and dizzy. Avoid alcoholic drinks. This medicine can make you more sensitive to the  sun. Keep out of the sun. If you cannot avoid being in the sun, wear protective clothing and use sunscreen. Do not use sun lamps or tanning beds/booths. What side effects may I notice from receiving this medicine? Side effects that you should report to your doctor or health care  professional as soon as possible: -blood in urine or stools -dry mouth -fever or chills -hearing loss or ringing in the ears -irregular heartbeat -muscle pain or weakness, cramps -skin rash -stomach upset, pain, or nausea -tingling or numbness in the hands or feet -unusually weak or tired -vomiting or diarrhea -yellowing of the eyes or skin Side effects that usually do not require medical attention (report to your doctor or health care professional if they continue or are bothersome): -headache -loss of appetite -unusual bleeding or bruising This list may not describe all possible side effects. Call your doctor for medical advice about side effects. You may report side effects to FDA at 1-800-FDA-1088. Where should I keep my medicine? Keep out of the reach of children. Store at room temperature between 15 and 30 degrees C (59 and 86 degrees F). Protect from light. Throw away any unused medicine after the expiration date. NOTE: This sheet is a summary. It may not cover all possible information. If you have questions about this medicine, talk to your doctor, pharmacist, or health care provider.  2015, Elsevier/Gold Standard. (2009-02-22 16:24:50)

## 2013-12-15 NOTE — Evaluation (Signed)
Physical Therapy Evaluation Patient Details Name: Charles [name] MRN: [id number] DOB: 1945/03/24 Today's Date: 12/15/2013   History of Present Illness  Pt is a 68 year old man with a history of HF, CAD s/p 3VCABG 2009, anemia with h/o GI bleed, and cocaine abuse who presents with chest pain. He was last admitted from 9/20-21. Pt stopped taking his home medications 3 days PTA b/c he did not feel well and last used cocaine 4 days PTA. Day of admit he started coughing with sputum production that was white w/ streaking. The coughing caused him to be SOB. He also starting experiencing pain from his neck down to his abdomen which he describes as a sore feeling. He rated chest pain as 8/10 and constant. Pt reports generalized abdominal pain that is dull in nature and reports fatigue and weakness. Pt admitted with SOB and chest pain.   Clinical Impression  Pt admitted with the above. Pt currently with functional limitations due to the deficits listed below (see PT Problem List). At the time of PT eval pt complained of increased LE tightness and pain with ambulation, however was motivated for distance. Pt states that PT will not "solve the problem" but was willing to participate. Pt will benefit from skilled PT to increase their independence and safety with mobility to allow discharge to the venue listed below.      Follow Up Recommendations Other (comment);Supervision for mobility/OOB (ALF)    Equipment Recommendations  Rolling walker with 5" wheels    Recommendations for Other Services       Precautions / Restrictions Precautions Precautions: Fall Precaution Comments: ~10 falls in the past year Restrictions Weight Bearing Restrictions: No      Mobility  Bed Mobility               General bed mobility comments: Pt sitting EOB when PT arrived.  Transfers Overall transfer level: Needs assistance Equipment used: None Transfers: Sit to/from Stand Sit to Stand: Supervision         General transfer comment: Pt demonstrated proper hand placement however required close supervision for safety as he was somewhat unsteady with initial stand.   Ambulation/Gait Ambulation/Gait assistance: Min guard Ambulation Distance (Feet): 350 Feet Assistive device: Rolling walker (2 wheeled) Gait Pattern/deviations: Step-through pattern;Decreased stride length;Trendelenburg Gait velocity: Decreased Gait velocity interpretation: Below normal speed for age/gender General Gait Details: Pt with unsteadiness requiring assist for safety with initial steps without AD. Pt given RW and was able to ambuate without noted unsteadiness. Many standing rest breaks due to LE pain, however pt states this is typical for him and was motivated for distance.   Stairs            Wheelchair Mobility    Modified Rankin (Stroke Patients Only)       Balance                                             Pertinent Vitals/Pain Pain Assessment: No/denies pain    Home Living Family/patient expects to be discharged to:: Private residence Living Arrangements: Children Available Help at Discharge: Family;Available 24 hours/day Type of Home: House Home Access: Stairs to enter Entrance Stairs-Rails: Right Entrance Stairs-Number of Steps: 6 Home Layout: One level Home Equipment: None      Prior Function Level of Independence: Independent         Comments: No  longer drives, 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, newspaper   Dominant Hand: Right    Extremity/Trunk Assessment   Upper Extremity Assessment: Defer to OT evaluation           Lower Extremity Assessment: Generalized weakness;RLE deficits/detail;LLE deficits/detail (Quad/HS weakness with MMT 3+/5 to 4-/5 grossly)      Cervical / Trunk Assessment: Normal  Communication   Communication: No difficulties  Cognition Arousal/Alertness: Awake/alert Behavior During Therapy: WFL for tasks assessed/performed Overall  Cognitive Status: No family/caregiver present to determine baseline cognitive functioning                      General Comments      Exercises        Assessment/Plan    PT Assessment Patient needs continued PT services  PT Diagnosis Difficulty walking;Generalized weakness   PT Problem List Decreased strength;Decreased range of motion;Decreased activity tolerance;Decreased balance;Decreased mobility;Decreased knowledge of use of DME;Decreased safety awareness;Decreased knowledge of precautions;Cardiopulmonary status limiting activity;Impaired sensation  PT Treatment Interventions DME instruction;Gait training;Stair training;Functional mobility training;Therapeutic activities;Therapeutic exercise;Neuromuscular re-education;Patient/family education   PT Goals (Current goals can be found in the Care Plan section) Acute Rehab PT Goals Patient Stated Goal: To live independently PT Goal Formulation: With patient Time For Goal Achievement: 12/22/13 Potential to Achieve Goals: Fair    Frequency Min 3X/week   Barriers to discharge        Co-evaluation               End of Session Equipment Utilized During Treatment: Gait belt Activity Tolerance: Patient tolerated treatment well Patient left: in chair;with call bell/phone within reach;Other (comment) (Staff present in room) Nurse Communication: Mobility status         Time: 3220-2542 PT Time Calculation (min): 32 min   Charges:   PT Evaluation $Initial PT Evaluation Tier I: 1 Procedure PT Treatments $Gait Training: 8-22 mins $Therapeutic Activity: 8-22 mins   PT G Codes:          Jolyn Lent 12/15/2013, 10:50 AM  Jolyn Lent, PT, DPT Acute Rehabilitation Services Pager: [phone]

## 2013-12-15 NOTE — Progress Notes (Signed)
  Date: 12/15/2013  Patient name: Charles [name]  Medical record number: [id number]  Date of birth: 02/27/1946   This patient has been seen and the plan of care was discussed with the house staff. Please see their note for complete details. I concur with their findings with the following additions/corrections: Mr Shanholtzer has had no further bleeding, CP, dyspnea. I saw him walking with PT and he was steady with walker. He doesn't qualify for SNF. He is resistant to AL bc they "take all your money." He wants to move the [facility] and have his own room but states he can't afford the rent and doesn't have a car to go look at places. Doesn't want to talk to social work. Medically stable for D/C.  Bartholomew Crews, MD 12/15/2013, 3:29 PM

## 2013-12-15 NOTE — Evaluation (Signed)
Occupational Therapy Evaluation Patient Details Name: Charles [name] MRN: [id number] DOB: [birth year] Today's Date: 12/15/2013    History of Present Illness Pt is a 68 year old man with a history of HF, CAD s/p 3VCABG 2009, anemia with h/o GI bleed, and cocaine abuse who presents with chest pain. He was last admitted from 9/20-21. Pt stopped taking his home medications 3 days PTA b/c he did not feel well and last used cocaine 4 days PTA. Day of admit he started coughing with sputum production that was white w/ streaking. The coughing caused him to be SOB. He also starting experiencing pain from his neck down to his abdomen which he describes as a sore feeling. He rated chest pain as 8/10 and constant. Pt reports generalized abdominal pain that is dull in nature and reports fatigue and weakness. Pt admitted with SOB and chest pain.    Clinical Impression   Pt admitted with above. Pt has had frequent falls at home. OT provided education.  Per chart, pt has family available to assist 24/7 at home. Pt planning to d/c.  Follow Up Recommendations  No OT follow up    Equipment Recommendations  None recommended by OT    Recommendations for Other Services       Precautions / Restrictions Precautions Precautions: Fall Precaution Comments: ~10 falls in the past year Restrictions Weight Bearing Restrictions: No      Mobility Bed Mobility               General bed mobility comments: not assessed  Transfers Overall transfer level: Modified independent               General transfer comment: Mod I for stand to sit transfer    Balance                                            ADL Overall ADL's : Needs assistance/impaired                Toilet transfer: Mod I for stand to sit transfer     Lower Body Dressing: Supervision/safety           Tub/ Shower Transfer: Ambulation;Min guard   Functional mobility during ADLs: Min guard General ADL  Comments: Pt up in room when OT arrived. Educated on safety for home (safe shoewear, sitting for LB ADLs). Pt taking breaks in hallway due to legs tightening up. Pt not willing to use walker with OT.     Vision                     Perception     Praxis      Pertinent Vitals/Pain Pain Assessment: Faces Faces Pain Scale: Hurts little more Pain Location: legs Pain Descriptors / Indicators: Tightness Pain Intervention(s): Monitored during session     Hand Dominance Right   Extremity/Trunk Assessment Upper Extremity Assessment Upper Extremity Assessment: Overall WFL for tasks assessed   Lower Extremity Assessment Lower Extremity Assessment: Defer to PT evaluation       Communication Communication Communication: No difficulties   Cognition Arousal/Alertness: Awake/alert Behavior During Therapy: WFL for tasks assessed/performed Overall Cognitive Status: No family/caregiver present to determine baseline cognitive functioning                     General Comments  Exercises       Shoulder Instructions      Home Living Family/patient expects to be discharged to:: Private residence Living Arrangements: Children Available Help at Discharge: Family;Available 24 hours/day Type of Home: House Home Access: Stairs to enter [employer] of Steps: 6 Entrance Stairs-Rails: Right Home Layout: One level     Bathroom Shower/Tub: Tub/shower unit         Home Equipment: None          Prior Functioning/Environment Level of Independence: Independent        Comments: No longer drives, 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    OT Diagnosis: Acute pain   OT Problem List: Decreased activity tolerance;Decreased safety awareness;Decreased knowledge of use of DME or AE;Decreased knowledge of precautions;Pain   OT Treatment/Interventions: Self-care/ADL training;DME and/or AE instruction;Therapeutic activities;Patient/family education;Balance training    OT  Goals(Current goals can be found in the care plan section) Acute Rehab OT Goals Patient Stated Goal: go home OT Goal Formulation: With patient Time For Goal Achievement: 12/22/13 Potential to Achieve Goals: Good  OT Frequency: Min 2X/week   Barriers to D/C:            Co-evaluation              End of Session Equipment Utilized During Treatment: Gait belt Nurse Communication: Other (comment) (saw pt ambulating)  Activity Tolerance: Patient tolerated treatment well Patient left: in bed   Time: 1434-1449 OT Time Calculation (min): 15 min (approximately 5 minutes of this spent standing and talking to social worker/nurse-became very mad about not getting cab). Charges:  OT General Charges $OT Visit: 1 Procedure OT Evaluation $Initial OT Evaluation Tier I: 1 Procedure G-CodesBenito Mccreedy OTR/L [phone] 12/15/2013, 3:09 PM

## 2013-12-15 NOTE — Progress Notes (Signed)
CSW (Clinical Education officer, museum) notified pt in need of transportation. CSW spoke with pt and he informed CSW he does not have anyone who can provide transport home. Pt confirmed he does live in [facility]. Per pt and pt nurse, pt not safe to dc by bus. CSW spoke with supervisor about providing pt with a taxi voucher. It was decided that pt is too high of a fall risk to dc and non-emergent ambulance should be used it pt does not have alternative transportation. CSW visited pt room and informed pt. Pt very upset and not understanding why he cannot go by cab. CSW explained concern for pt safety. Pt agreed to [place] calling non-emergent ambulance but still upset when CSW left room. Pt was left with OT. CSW arranged non-emergent ambulance transport. Pt nurse notified. CSW signing off.  [place], [place]

## 2013-12-15 NOTE — Progress Notes (Signed)
Patient has stated he feels great and chest pain is fine. Will continue to monitor patient

## 2013-12-15 NOTE — Care Management Note (Addendum)
  Page 2 of 2   12/15/2013     4:54:49 PM CARE MANAGEMENT NOTE 12/15/2013  Patient:  Charles [name], Charles [name]   Account Number:  [id number]  Date Initiated:  12/15/2013  Documentation initiated by:  Mariann Laster  Subjective/Objective Assessment:   CP, polysubstance     Action/Plan:   CM to follow for disposition needs   Anticipated DC Date:  12/15/2013   Anticipated DC Plan:  [place]  In-house referral  NA         [facility] Choice  HOME HEALTH   Choice offered to / List presented to:  NA        HH arranged  HH-1 RN  [place]      [place].   Status of service:  Completed, signed off Medicare Important Message given?  YES (If response is "NO", the following Medicare IM given date fields will be blank) Date Medicare IM given:  12/15/2013 Medicare IM given by:  Mikaeel Petrow Date Additional Medicare IM given:   Additional Medicare IM given by:    Discharge Disposition:  [place]  Per UR Regulation:  Reviewed for med. necessity/level of care/duration of stay  If discussed at [place] of Stay Meetings, dates discussed:    Comments:  Jakeia Carreras RN, BSN, MSHL, CCM  Nurse - Case Manager,  ([address])  [phone]  12/15/2013 Social:  From home with DTR.  Hx/o patient interested in subsidized housing options. PCP:  Dr. [name] in [facility], [place]  - currenlty  not [facility] eligible because  PCP is not in network.  Hx/o Patient wants to move to [place] and needs assist with transportation. (Patient d/c prior to CM notification of this need). PT RECS:  Other (comment);Supervision for mobility/OOB (ALF DME RECS:  Rolling walker with 5" wheels  (Patient d/c prior to CM notification Disposition Plan:  HHS RN, PT, HSE, SW [facility] notified) CM notified [place] in hand off to f/u on walker needs in the home and social needs as indicated above through OP HHS's.

## 2013-12-15 NOTE — Progress Notes (Deleted)
Ambulance here to take patient home for discharge. Patient taken out of hospital via stretcher.

## 2013-12-15 NOTE — Discharge Summary (Signed)
Name: Charles [name] MRN: [id number] DOB: [birth year] 68 y.o. PCP: [name]  Date of Admission: 12/13/2013  8:50 PM Date of Discharge: 12/15/2013 Attending Physician: Bartholomew Crews, MD  Discharge Diagnosis: Active Problems:   Chest pain  Discharge Medications:   Medication List         amLODipine 10 MG tablet  Commonly known as:  NORVASC  Take 10 mg by mouth daily.     aspirin EC 81 MG tablet  Take 81 mg by mouth daily.     citalopram 20 MG tablet  Commonly known as:  CELEXA  Take 20 mg by mouth daily.     cloNIDine 0.2 MG tablet  Commonly known as:  CATAPRES  Take 0.2 mg by mouth 2 (two) times daily.     docusate sodium 100 MG capsule  Commonly known as:  COLACE  Take 100 mg by mouth 2 (two) times daily.     ferrous sulfate 325 (65 FE) MG tablet  Take 325 mg by mouth daily.     furosemide 80 MG tablet  Commonly known as:  LASIX  Take 80 mg by mouth daily.     gabapentin 100 MG capsule  Commonly known as:  NEURONTIN  Take 100 mg by mouth 2 (two) times daily.     hydrALAZINE 50 MG tablet  Commonly known as:  APRESOLINE  Take 50 mg by mouth 3 (three) times daily.     isosorbide mononitrate 30 MG 24 hr tablet  Commonly known as:  IMDUR  Take 30 mg by mouth daily.     pantoprazole 40 MG tablet  Commonly known as:  PROTONIX  Take 40 mg by mouth 2 (two) times daily.     phenytoin 100 MG ER capsule  Commonly known as:  DILANTIN  Take 200-300 mg by mouth 2 (two) times daily. 200mg  in the morning, 300mg  in the evening     simvastatin 40 MG tablet  Commonly known as:  ZOCOR  Take 40 mg by mouth every evening.        Disposition and follow-up:   Mr.Charles [name] was discharged from [facility] in Stable condition.  At the hospital follow up visit please address:  1.  [name] presented for pain in his thorax and abdomen; it resolved while he was here. He was found to have a decreased hemoglobin (to 6.4) which  responded to 1 U PRBC (increased to discharge hgb of 7.6). His baseline is 7-9. He also has chronic iron deficiency anemia. GI assessed and decided repeat scope was not necessary, given that there is no acute bleed. Please continue to trend hgb and encourage outpatient iron supplementation.  Furthermore, he was fluid overloaded in the setting of lasix noncompliance as an outpatient. His acute on chronic diastolic heart failure was managed with IV then PO lasix in the hospital, and though weights were not reliably taken, his exam was significant for improvement.  2.  Labs / imaging needed at time of follow-up: CBC for hemoglobin  3.  Pending labs/ test needing follow-up: none  Follow-up Appointments: Follow-up Information   Follow up with [name].   Specialty:  Family Medicine   Contact information:   [address] [facility] [place] [address] [phone]       Discharge Instructions:  It is very important that you take your lasix and your iron supplements at home.   Furosemide tablets What is this  medicine? FUROSEMIDE (fyoor [place] se mide) is a diuretic. It helps you make more urine and to lose salt and excess water from your body. This medicine is used to treat high blood pressure, and edema or swelling from heart, kidney, or liver disease. This medicine may be used for other purposes; ask your health care provider or pharmacist if you have questions. COMMON BRAND NAME(S): Delone, Lasix What should I tell my health care provider before I take this medicine? They need to know if you have any of these conditions: -abnormal blood electrolytes -diarrhea or vomiting -gout -heart disease -kidney disease, small amounts of urine, or difficulty passing urine -liver disease -an unusual or allergic reaction to furosemide, sulfa drugs, other medicines, foods, dyes, or preservatives -pregnant or trying to get pregnant -breast-feeding How  should I use this medicine? Take this medicine by mouth with a glass of water. Follow the directions on the prescription label. You may take this medicine with or without food. If it upsets your stomach, take it with food or milk. Do not take your medicine more often than directed. Remember that you will need to pass more urine after taking this medicine. Do not take your medicine at a time of day that will cause you problems. Do not take at bedtime. Talk to your pediatrician regarding the use of this medicine in children. While this drug may be prescribed for selected conditions, precautions do apply. Overdosage: If you think you have taken too much of this medicine contact a poison control center or emergency room at once. NOTE: This medicine is only for you. Do not share this medicine with others. What if I miss a dose? If you miss a dose, take it as soon as you can. If it is almost time for your next dose, take only that dose. Do not take double or extra doses. What may interact with this medicine? -aspirin and aspirin-like medicines -certain antibiotics -chloral hydrate -cisplatin -cyclosporine -digoxin -diuretics -laxatives -lithium -medicines for blood pressure -medicines that relax muscles for surgery -methotrexate -NSAIDs, medicines for pain and inflammation like ibuprofen, naproxen, or indomethacin -phenytoin -steroid medicines like prednisone or cortisone -sucralfate This list may not describe all possible interactions. Give your health care provider a list of all the medicines, herbs, non-prescription drugs, or dietary supplements you use. Also tell them if you smoke, drink alcohol, or use illegal drugs. Some items may interact with your medicine. What should I watch for while using this medicine? Visit your doctor or health care professional for regular checks on your progress. Check your blood pressure regularly. Ask your doctor or health care professional what your blood  pressure should be, and when you should contact him or her. If you are a diabetic, check your blood sugar as directed. You may need to be on a special diet while taking this medicine. Check with your doctor. Also, ask how many glasses of fluid you need to drink a day. You must not get dehydrated. You may get drowsy or dizzy. Do not drive, use machinery, or do anything that needs mental alertness until you know how this drug affects you. Do not stand or sit up quickly, especially if you are an older patient. This reduces the risk of dizzy or fainting spells. Alcohol can make you more drowsy and dizzy. Avoid alcoholic drinks. This medicine can make you more sensitive to the sun. Keep out of the sun. If you cannot avoid being in the sun, wear protective clothing and use sunscreen. Do  not use sun lamps or tanning beds/booths. What side effects may I notice from receiving this medicine? Side effects that you should report to your doctor or health care professional as soon as possible: -blood in urine or stools -dry mouth -fever or chills -hearing loss or ringing in the ears -irregular heartbeat -muscle pain or weakness, cramps -skin rash -stomach upset, pain, or nausea -tingling or numbness in the hands or feet -unusually weak or tired -vomiting or diarrhea -yellowing of the eyes or skin Side effects that usually do not require medical attention (report to your doctor or health care professional if they continue or are bothersome): -headache -loss of appetite -unusual bleeding or bruising This list may not describe all possible side effects. Call your doctor for medical advice about side effects. You may report side effects to FDA at 1-800-FDA-1088. Where should I keep my medicine? Keep out of the reach of children. Store at room temperature between 15 and 30 degrees C (59 and 86 degrees F). Protect from light. Throw away any unused medicine after the expiration date. NOTE: This sheet is a  summary. It may not cover all possible information. If you have questions about this medicine, talk to your doctor, pharmacist, or health care provider.  2015, Elsevier/Gold Standard. (2009-02-22 16:24:50)   Consultations:  GI  Procedures Performed:  Dg Chest 2 View  12/13/2013   CLINICAL DATA:  Shortness of breath.  Hypertension.  EXAM: CHEST  2 VIEW  COMPARISON:  12/07/2013  FINDINGS: Shallow inspiration. Postoperative change in the mediastinum. Cardiac enlargement with pulmonary vascular congestion. Mild hazy interstitial pattern suggest mild edema. No airspace disease or consolidation. No blunting of costophrenic angles. No pneumothorax.  IMPRESSION: Cardiac enlargement with pulmonary vascular congestion and mild interstitial edema. Similar to previous study.   Electronically Signed   By: Lucienne Capers M.D.   On: 12/13/2013 22:51   Nm Myocar Multi W/spect W/wall Motion / Ef  11/29/2013   CLINICAL DATA:  Chest Pain  EXAM: MYOCARDIAL IMAGING WITH SPECT (REST AND PHARMACOLOGIC-STRESS)  GATED LEFT VENTRICULAR WALL MOTION STUDY  LEFT VENTRICULAR EJECTION FRACTION  TECHNIQUE: Standard myocardial SPECT imaging was performed after resting intravenous injection of 10 mCi Tc-11m sestamibi. Subsequently, intravenous infusion of Lexiscan was performed under the supervision of the Cardiology staff. At peak effect of the drug, 30 mCi Tc-61m sestamibi was injected intravenously and standard myocardial SPECT imaging was performed. Quantitative gated imaging was also performed to evaluate left ventricular wall motion, and estimate left ventricular ejection fraction.  COMPARISON:  03/12/2013  FINDINGS: Perfusion: There is a large area of markedly decreased activity in the inferior wall of the left ventricular myocardium. No reversible component.  Wall Motion: Left ventricular dilatation. Decreased inferior wall motion and thickening.  Left Ventricular Ejection Fraction: 35 %  End diastolic volume 416 ml  End  systolic volume 384 ml  IMPRESSION: 1. No reversible ischemia.  Fixed large inferior wall defect.  2. Left ventricular dilatation with decreased inferior wall motion.  3. Left ventricular ejection fraction 35%  4. High -risk stress test findings*.  *2012 Appropriate Use Criteria for Coronary Revascularization Focused Update: J Am Coll Cardiol. 5364;68(0):321-224. http://content.airportbarriers.com.aspx?articleid=[email or web address]   Electronically Signed   By: Arne Cleveland M.D.   On: 11/29/2013 15:22   Dg Chest Port 1 View  12/07/2013   CLINICAL DATA:  Lethargy. Fatigue, shortness of breath and chest pain.  EXAM: PORTABLE CHEST - 1 VIEW  COMPARISON:  Chest radiograph performed 11/26/2013  FINDINGS: The lungs are well-aerated.  Persistent vascular congestion is noted. Mildly increased interstitial markings may reflect mild interstitial edema. There is no evidence of pleural effusion or pneumothorax.  The cardiomediastinal silhouette is mildly enlarged. The patient is status post median sternotomy, with evidence of prior CABG. No acute osseous abnormalities are seen.  IMPRESSION: Vascular congestion and mild cardiomegaly. Mildly increased interstitial markings again noted, raising concern for mild persistent interstitial edema.   Electronically Signed   By: Garald Balding M.D.   On: 12/07/2013 05:55   Dg Chest Portable 1 View  11/26/2013   CLINICAL DATA:  Chest pain  EXAM: PORTABLE CHEST - 1 VIEW  COMPARISON:  10/23/2013  FINDINGS: Postoperative changes in the mediastinum. Shallow inspiration. Heart size is enlarged and there is pulmonary vascular congestion centrally. Suggestion of mild interstitial edema in the lung bases. No focal consolidation. No pneumothorax. No blunting of costophrenic angles.  IMPRESSION: Cardiac enlargement with central pulmonary vascular congestion and mild interstitial edema.   Electronically Signed   By: Lucienne Capers M.D.   On: 11/26/2013 04:28   Admission HPI:  68 year old man with  a history of HFpEF, CAD s/p 3VCABG 2009, anemia with h/o GI bleed, and cocaine abuse who presents with chest pain. He was last admitted for the same above reasons from 9/20-21. Pt stopped taking his home medications 3 days ago b/c he did not feel well and last used cocaine 4 days ago. Today around 12:00pm he started coughing with sputum production that was white w/ streaking. The coughing caused him to be SOB. He also starting experiencing pain from his neck down to his abdomen which he describes as a sore feeling. He also has rt sided chest pain that is sharp in nature and centered around his sternal area. He rates chest pain as 8/10 and constant. He also states that his chest burns when he breaths. He states that this chest pain is different from his prior episodes due to the pain that starts at his neck and occurs on both sides of his chest. He also reports he starts coughing when he has fluid build up. Pt reports generalized abdominal pain that is dull in nature and he rates it as 10/10 in severity. He has noticed swelling in his abdomen and legs. He reports fatigue and weakness. Denies fevers, changes in appetite, blood in stools, change in odor of stools, HA, and pressure behind eyes.   Hospital Course by problem list: Active Problems:   Chest pain This 68 yo man with HFpEF, CAD (s/p 3v CABG in 2009), anemia (h/o antral AVM and GI bleed, ablated in 07/2013), CKD, DMII and cocaine abuse was admitted to [facility] with abdominal distension, vague abdomina/chest pain and anemia, all in the setting of medication noncompliance and active cocaine use. His EKG and CXR were unchanged from discharge of his prior hospitalization last week but his BNP was elevated and his hemoglobin dropped. He had + orthostatics and was FOBT+. He has had extensive cardiac workups in this hospital but continues to reappear with chest pain; he is interested in assisted living placement.   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 EGD 07/2013 revealed an antral AVM that was ablated. Iron studies previously indicated iron deficiency anemia with no obvious chronic bleeding source. He is on home iron supplementation; unclear whether he has been taking it. On admission, Hb 6.4 down from 7.3 on last admission. Was transfused 1 unit over the weekend to  Hb 7.8 --> 7.6. GI assessed the patient and recommends continued iron supplementation (patient has not been taking it regularly) and PPI BID; no overt bleed, so no indication for repeat scopes at this time   Iron Deficiency Anemia: Baseline hemoglobin between 7-9. Recent anemia panel revealed iron level of 16, ferritin of 34, with a TIBC of 382. Folate level was normal. Patient to continue with iron supplementation at home  Acute on Chronic Diastolic Heart Failure: echo on 11/27/13 showed reduced EF 40-45% (down from 50-55% with grade 2 diastolic dysfunction in 09/348) with EF 35% on lexiscan 11/29/13. Patient is noncompliant with his lasix (80mg  daily at home); last took it on 9/24. ProBNP on last admission >5000-->1908; now back to 6456. Initially restarted lasix 40mg  IV--> home dose 80 mg PO daily. Lung exam improved from admission. Also restarted home hydralazine for afterload reduction. Is&Os and daily weights were very unreliable.  Pain from neck to pelvis: Negative cardiac workup last week, i-stat troponin negative, repeat EKG unchanged. Troponins negative x3. No tenderness on exam by discharge. Continued home aspirin, imdur, ccb, statin and morphine for pain management.  Hypertension: less elevated on this admission (130s/70s); his hypertension is likely generally a result of medication noncompliance and cocaine use. Home medications are amlodipine 10 mg daily, clonidine 0.2 mg twice daily, hydralazine 50mg  TID and lasix 80 mg daily. Last took these on 9/24.  Cocaine Abuse: counseling patient on cessation; patient states that his home  environment is not conducive to quitting (he currently lives with his daughter). Is interested in returning to assisted living. Social work consulted for assistance with placement; this could be good for his medication compliance as well. Unable to place without longer inpatient stay.  CKD Stage II: renal function improved with creatinine 1.18 to 1.20 (down from 1.43). Baseline likely ~1.2.   Discharge Vitals:   BP 137/71  Pulse 62  Temp(Src) 97.8 F (36.6 C) (Oral)  Resp 18  Ht 5' 4.96" (1.65 m)  Wt 175 lb 0.7 oz (79.4 kg)  BMI 29.16 kg/m2  SpO2 98%  Discharge Labs:  Results for orders placed during the hospital encounter of 12/13/13 (from the past 24 hour(s))  TROPONIN I     Status: None   Collection Time    12/14/13  1:40 PM      Result Value Ref Range   Troponin I <0.30  <0.30 ng/mL  TROPONIN I     Status: None   Collection Time    12/14/13  7:55 PM      Result Value Ref Range   Troponin I <0.30  <0.30 ng/mL  CBC     Status: Abnormal   Collection Time    12/15/13  3:43 AM      Result Value Ref Range   WBC 5.2  4.0 - 10.5 K/uL   RBC 3.15 (*) 4.22 - 5.81 MIL/uL   Hemoglobin 7.6 (*) 13.0 - 17.0 g/dL   HCT 24.3 (*) 39.0 - 52.0 %   MCV 77.1 (*) 78.0 - 100.0 fL   MCH 24.1 (*) 26.0 - 34.0 pg   MCHC 31.3  30.0 - 36.0 g/dL   RDW 18.1 (*) 11.5 - 15.5 %   Platelets 375  150 - 400 K/uL    Signed: Drucilla Schmidt, MD 12/15/2013, 12:22 PM    Services Ordered on Discharge: none Equipment Ordered on Discharge: none

## 2013-12-15 NOTE — Progress Notes (Signed)
Pharmacist Heart Failure Core Measure Documentation  Assessment: Charles [name] has an EF documented as 40-45% on 11/27/13 by 2D ECHO  Rationale: Heart failure patients with left ventricular systolic dysfunction (LVSD) and an EF < 40% should be prescribed an angiotensin converting enzyme inhibitor (ACEI) or angiotensin receptor blocker (ARB) at discharge unless a contraindication is documented in the medical record.  This patient is not currently on an ACEI or ARB for HF.  This note is being placed in the record in order to provide documentation that a contraindication to the use of these agents is present for this encounter.  ACE Inhibitor or Angiotensin Receptor Blocker is contraindicated (specify all that apply)  []   ACEI allergy AND ARB allergy []   Angioedema []   Moderate or severe aortic stenosis []   Hyperkalemia []   Hypotension []   Renal artery stenosis [x]   Worsening renal function, preexisting renal disease or dysfunction (CKD, stage III).  Patient had been on ACEI in the past but MD stopped ACEI secondary to renal function -per Dr. Zenovia Jarred H&P note on 12/13/13.  (per EPIC med review patient was on  lisinopril in the past but this was stopped 08/25/2013) . Nicole Cella, RPh Clinical Pharmacist Pager: [phone] 12/15/2013 10:47 AM

## 2013-12-15 NOTE — Progress Notes (Signed)
Cardiac monitor discontinued per order and cleaned. CCMD notified.

## 2013-12-15 NOTE — Progress Notes (Addendum)
Home discharge instructions discussed with patient. Discussed follow up appt, medications, diet and activity. Patient able to use teach back with medications and home instructions. Patient states he will need cab voucher to get home. [place], SW made aware and is working on Scientist, forensic at this time.

## 2013-12-15 NOTE — Progress Notes (Addendum)
Subjective: Charles [name] is "good" this morning; he reports no further chest/abdomen pain. He is concerned that he is losing blood. He believes he has been taking his iron supplements. He would like to leave the hospital today.  Objective: Vital signs in last 24 hours: Filed Vitals:   12/15/13 0056 12/15/13 0103 12/15/13 0622 12/15/13 0946  BP: 127/69 116/64 132/77 137/71  Pulse: 56 53 55 62  Temp:   97.7 F (36.5 C) 97.8 F (36.6 C)  TempSrc:   Oral Oral  Resp: 20 20 19 18   Height:      Weight:   175 lb 0.7 oz (79.4 kg)   SpO2: 100% 100% 97% 98%   Weight change: 4 lb 0.7 oz (1.835 kg)  Intake/Output Summary (Last 24 hours) at 12/15/13 1105 Last data filed at 12/15/13 0857  Gross per 24 hour  Intake    460 ml  Output    800 ml  Net   -340 ml   Physical Exam: Appearance: sitting up at side of bed, talking to nurse, in NAD HEENT: AT/[place], mild scleral icterus, EOMi, no lymphadeopathy Heart: RRR, normal S1S2, no MRG Lungs: CTAB with very faint RLL crackles; improved from yesterday's exam Abdomen: BS+, slightly distended, epigastric tenderness to deep palpation Musculoskeletal: normal range of motion Extremities: trace edema BLE similar to yesterday's exam Neurologic: A&Ox3, sensation and strength grossly intact Skin: no rashes or lesions  Lab Results: Basic Metabolic Panel:  Recent Labs Lab 12/13/13 2100 12/14/13 0801  NA 138 138  K 4.5 4.2  CL 104 102  CO2 20 21  GLUCOSE 89 96  BUN 20 20  CREATININE 1.18 1.20  CALCIUM 8.2* 8.3*   Liver Function Tests:  Recent Labs Lab 12/13/13 2100  AST 27  ALT 14  ALKPHOS 100  BILITOT 0.3  PROT 7.3  ALBUMIN 3.0*   CBC:  Recent Labs Lab 12/13/13 2100 12/14/13 0801 12/15/13 0343  WBC 6.0 4.0 5.2  NEUTROABS 4.1  --   --   HGB 6.4* 7.8* 7.6*  HCT 21.1* 25.6* 24.3*  MCV 77.0* 80.0 77.1*  PLT 361 345 375   Cardiac Enzymes:  Recent Labs Lab 12/14/13 0801 12/14/13 1340 12/14/13 1955  TROPONINI <0.30   <0.30 <0.30 <0.30   BNP:  Recent Labs Lab 12/13/13 2100  PROBNP 6456.0*   CBG:  Recent Labs Lab 12/08/13 1219  GLUCAP 107*   Coagulation:  Recent Labs Lab 12/13/13 2313  LABPROT 15.5*  INR 1.23   Urine Drug Screen: Drugs of Abuse     Component Value Date/Time   LABOPIA POSITIVE* 12/13/2013 2310   COCAINSCRNUR POSITIVE* 12/13/2013 2310   LABBENZ NONE DETECTED 12/13/2013 2310   AMPHETMU NONE DETECTED 12/13/2013 2310   THCU POSITIVE* 12/13/2013 2310   LABBARB NONE DETECTED 12/13/2013 2310     Micro Results: Recent Results (from the past 240 hour(s))  MRSA PCR SCREENING     Status: Abnormal   Collection Time    12/07/13  2:17 PM      Result Value Ref Range Status   MRSA by PCR POSITIVE (*) NEGATIVE Final   Comment:            The GeneXpert MRSA Assay (FDA     approved for NASAL specimens     only), is one component of a     comprehensive MRSA colonization     surveillance program. It is not     intended to diagnose MRSA     infection nor  to guide or     monitor treatment for     MRSA infections.     RESULT CALLED TO, READ BACK BY AND VERIFIED WITH:     Lemmie Evens [id number] SHIPMAN M   Studies/Results: Dg Chest 2 View  12/13/2013   CLINICAL DATA:  Shortness of breath.  Hypertension.  EXAM: CHEST  2 VIEW  COMPARISON:  12/07/2013  FINDINGS: Shallow inspiration. Postoperative change in the mediastinum. Cardiac enlargement with pulmonary vascular congestion. Mild hazy interstitial pattern suggest mild edema. No airspace disease or consolidation. No blunting of costophrenic angles. No pneumothorax.  IMPRESSION: Cardiac enlargement with pulmonary vascular congestion and mild interstitial edema. Similar to previous study.   Electronically Signed   By: Lucienne Capers M.D.   On: 12/13/2013 22:51   EKG: unchanged from previous tracings, NSR, prolonged qTc  Medications: I have reviewed the patient's current medications. Scheduled Meds: . amLODipine  10 mg Oral  Daily  . aspirin EC  81 mg Oral Daily  . citalopram  20 mg Oral Daily  . cloNIDine  0.2 mg Oral BID  . docusate sodium  100 mg Oral BID  . ferrous sulfate  325 mg Oral Daily  . furosemide  80 mg Oral Daily  . gabapentin  100 mg Oral BID  . hydrALAZINE  50 mg Oral TID  . isosorbide mononitrate  30 mg Oral Daily  . pantoprazole  40 mg Oral BID  . phenytoin  200 mg Oral Daily  . phenytoin  300 mg Oral QHS  . simvastatin  40 mg Oral QPM   Continuous Infusions:  PRN Meds:.morphine injection, nitroGLYCERIN Assessment/Plan: Active Problems:   Chest pain This 68 yo man with HFpEF, CAD (s/p 3v CABG in 2009), anemia (h/o antral AVM and GI bleed, ablated in 07/2013), CKD, DMII and cocaine abuse is here with abdominal distension, vague abdomina/chest pain and anemia, all in the setting of medication noncompliance and active cocaine use. His EKG and CXR were unchanged from discharge of his prior hospitalization last week but his BNP was elevated and his hemoglobin dropped. He had + orthostatics and was FOBT+. He has had extensive cardiac workups in this hospital but continues to reappear with chest pain; he is interested in assisted living placement.  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 EGD 07/2013 revealed an antral AVM that was ablated. Iron studies previously indicated iron deficiency anemia with no obvious chronic bleeding source. He is on home iron supplementation; unclear whether he has been taking it. On admission, Hb 6.4 down from 7.3 on last admission. Was transfused 1 unit over the weekend to Hb 7.8. Repeat today was 7.6. - GI recommends continued iron supplementation (patient has not been taking it regularly) and PPI BID; no overt bleed, so no indication for repeat scopes at this time - Continue to trend CBC - Trend orthostatics  Iron Deficiency Anemia: Baseline hemoglobin between 7-9. Recent anemia panel revealed iron level of 16, ferritin of 34, with  a TIBC of 382. Folate level was normal.  - Continue with iron supplementation   Acute on Chronic Diastolic Heart Failure: echo on 11/27/13 showed reduced EF 40-45% (down from 50-55% with grade 2 diastolic dysfunction in 07/6211) with EF 35% on lexiscan 11/29/13. Patient is noncompliant with his lasix (80mg  daily at home); last took it on 9/24. ProBNP on last admission >5000-->1908; now back to 6456. Initially considered an extra dose of 20 mg IV lasix today  along with his PO dose, but lung exam was improved today. - Tele - Initially restarted lasix 40mg  IV--> now on 80 mg PO daily - Restarted home hydralazine for afterload reduction - Strict Is&Os and daily weights have been very unreliable  Pain from neck to pelvis: Negative cardiac workup last week, i-stat troponin negative, repeat EKG unchanged. Troponins negative x3. No tenderness on exam today, no pain today. - Continue aspirin, imdur, ccb, statin and morphine for pain management - GI cocktail given  Hypertension: less elevated on this admission (130s/70s); his hypertension is likely generally a result of medication noncompliance and cocaine use. Home medications are amlodipine 10 mg daily, clonidine 0.2 mg twice daily, hydralazine 50mg  TID and lasix 80 mg daily. Last took these on 9/24.  - Restart home medications - Avoid BB in context of cocaine abuse  Cocaine Abuse: counseling patient on cessation; patient states that his home environment is not conducive to quitting (he currently lives with his daughter). Is interested in returning to assisted living.  - Social work consulted for assistance with placement; this could be good for his medication compliance as well  CKD Stage II: renal function improved with creatinine 1.18 to 1.20 (down from 1.43). Baseline likely ~1.2.  - trend BMP  Diet:  - HH  DVT Ppx:  - SCDs  Dispo: Disposition is deferred at this time. Awaiting word from SW on potential placement options.  Anticipated discharge  in approximately 0-2 day(s).   The patient does have a current PCP [name]) and does not need an [facility] hospital follow-up appointment after discharge.  The patient does have transportation limitations that hinder transportation to clinic appointments.  .Services Needed at time of discharge: Y = Yes, Blank = No PT:   OT:   RN:   Equipment:   Other:     LOS: 2 days   Drucilla Schmidt, MD 12/15/2013, 11:05 AM

## 2013-12-15 NOTE — Progress Notes (Signed)
Patient evaluated for community based chronic disease management services with [facility] as a benefit of patient's [employer]. Spoke with patient at bedside to explain [facility].  Patient is currently not [facility] eligible because he PCP is not in network.  Asked patient why he does not consistently go to [facility] he says that he gets better care at [facility].  Asked if he was satisfied with his current PCP services and cardiologist he said his cardiologist was god but would consider a new PCP.  Patient wants to move to [place] and needs assist with transportation.  Will refer to inpatient care manager for follow up.  If patient decides to change PCP [facility] will engage if the new PCP is in our network.  Of note, [facility] Care Management services does not replace or interfere with any services that are arranged by inpatient case management or social work.  For additional questions or referrals please contact Corliss Blacker BSN RN [facility] Liaison at [phone].

## 2013-12-15 NOTE — Progress Notes (Signed)
Patient complained of 10 out of 10 chest pain around 0045. An ekg was done as well as 2 liter of O2 placed on patient. Vital signs were normal and patient was given i dose of nitro. Patient still complained of 10 out of 10 chest pain so a second nitro was given and the ID MD on call was paged. The MD was made aware of the situation and ordered to give morphine. Patient had stable vital signs and was given morphine. Patient stated his pain went from a 10 to an 8 to a 6 and he felt much better. Will continue to monitor patient.

## 2013-12-15 NOTE — Progress Notes (Signed)
Tried calling [facility] to schedule follow up appointment for patient, but did not get an answer. Message was previously left by secretary to call back for follow up. Patient states number in chart is correct, so plan to call patient when follow up appointment is set up.

## 2013-12-20 ENCOUNTER — Emergency Department (HOSPITAL_COMMUNITY): Payer: Medicare Other

## 2013-12-20 ENCOUNTER — Encounter (HOSPITAL_COMMUNITY): Payer: Self-pay | Admitting: Emergency Medicine

## 2013-12-20 ENCOUNTER — Observation Stay (HOSPITAL_COMMUNITY)
Admission: EM | Admit: 2013-12-20 | Discharge: 2013-12-22 | Disposition: A | Discharge status: Home / Self Care | Payer: Medicare Other | Attending: Emergency Medicine | Admitting: Emergency Medicine

## 2013-12-20 DIAGNOSIS — Z79899 Other long term (current) drug therapy: Secondary | ICD-10-CM | POA: Diagnosis not present

## 2013-12-20 DIAGNOSIS — I5042 Chronic combined systolic (congestive) and diastolic (congestive) heart failure: Secondary | ICD-10-CM | POA: Diagnosis not present

## 2013-12-20 DIAGNOSIS — I701 Atherosclerosis of renal artery: Secondary | ICD-10-CM | POA: Diagnosis not present

## 2013-12-20 DIAGNOSIS — R0602 Shortness of breath: Secondary | ICD-10-CM | POA: Insufficient documentation

## 2013-12-20 DIAGNOSIS — E785 Hyperlipidemia, unspecified: Secondary | ICD-10-CM | POA: Diagnosis not present

## 2013-12-20 DIAGNOSIS — R05 Cough: Secondary | ICD-10-CM | POA: Diagnosis not present

## 2013-12-20 DIAGNOSIS — D509 Iron deficiency anemia, unspecified: Secondary | ICD-10-CM | POA: Diagnosis not present

## 2013-12-20 DIAGNOSIS — N183 Chronic kidney disease, stage 3 unspecified: Secondary | ICD-10-CM | POA: Diagnosis present

## 2013-12-20 DIAGNOSIS — F1410 Cocaine abuse, uncomplicated: Secondary | ICD-10-CM | POA: Diagnosis present

## 2013-12-20 DIAGNOSIS — Z590 Homelessness: Secondary | ICD-10-CM | POA: Diagnosis not present

## 2013-12-20 DIAGNOSIS — E119 Type 2 diabetes mellitus without complications: Secondary | ICD-10-CM | POA: Diagnosis not present

## 2013-12-20 DIAGNOSIS — R079 Chest pain, unspecified: Principal | ICD-10-CM | POA: Diagnosis present

## 2013-12-20 DIAGNOSIS — Q2733 Arteriovenous malformation of digestive system vessel: Secondary | ICD-10-CM | POA: Diagnosis not present

## 2013-12-20 DIAGNOSIS — G40909 Epilepsy, unspecified, not intractable, without status epilepticus: Secondary | ICD-10-CM | POA: Insufficient documentation

## 2013-12-20 DIAGNOSIS — M5136 Other intervertebral disc degeneration, lumbar region: Secondary | ICD-10-CM | POA: Diagnosis not present

## 2013-12-20 DIAGNOSIS — I739 Peripheral vascular disease, unspecified: Secondary | ICD-10-CM | POA: Diagnosis not present

## 2013-12-20 DIAGNOSIS — G4733 Obstructive sleep apnea (adult) (pediatric): Secondary | ICD-10-CM | POA: Insufficient documentation

## 2013-12-20 DIAGNOSIS — I129 Hypertensive chronic kidney disease with stage 1 through stage 4 chronic kidney disease, or unspecified chronic kidney disease: Secondary | ICD-10-CM | POA: Insufficient documentation

## 2013-12-20 DIAGNOSIS — I2510 Atherosclerotic heart disease of native coronary artery without angina pectoris: Secondary | ICD-10-CM | POA: Diagnosis not present

## 2013-12-20 DIAGNOSIS — Z720 Tobacco use: Secondary | ICD-10-CM | POA: Insufficient documentation

## 2013-12-20 DIAGNOSIS — R062 Wheezing: Secondary | ICD-10-CM | POA: Insufficient documentation

## 2013-12-20 DIAGNOSIS — I5023 Acute on chronic systolic (congestive) heart failure: Secondary | ICD-10-CM | POA: Diagnosis present

## 2013-12-20 DIAGNOSIS — I5043 Acute on chronic combined systolic (congestive) and diastolic (congestive) heart failure: Secondary | ICD-10-CM | POA: Diagnosis present

## 2013-12-20 DIAGNOSIS — Z91148 Patient's other noncompliance with medication regimen for other reason: Secondary | ICD-10-CM

## 2013-12-20 DIAGNOSIS — I255 Ischemic cardiomyopathy: Secondary | ICD-10-CM | POA: Insufficient documentation

## 2013-12-20 DIAGNOSIS — I10 Essential (primary) hypertension: Secondary | ICD-10-CM | POA: Diagnosis present

## 2013-12-20 DIAGNOSIS — Z9114 Patient's other noncompliance with medication regimen: Secondary | ICD-10-CM

## 2013-12-20 DIAGNOSIS — Z7982 Long term (current) use of aspirin: Secondary | ICD-10-CM | POA: Insufficient documentation

## 2013-12-20 DIAGNOSIS — F1910 Other psychoactive substance abuse, uncomplicated: Secondary | ICD-10-CM | POA: Diagnosis present

## 2013-12-20 LAB — BASIC METABOLIC PANEL
Anion gap: 14 (ref 5–15)
BUN: 20 mg/dL (ref 6–23)
CALCIUM: 8.9 mg/dL (ref 8.4–10.5)
CHLORIDE: 99 meq/L (ref 96–112)
CO2: 18 meq/L — AB (ref 19–32)
CREATININE: 1.05 mg/dL (ref 0.50–1.35)
GFR calc Af Amer: 82 mL/min — ABNORMAL LOW (ref >90)
GFR calc non Af Amer: 70 mL/min — ABNORMAL LOW (ref >90)
Glucose, Bld: 93 mg/dL (ref 70–99)
Potassium: 4.3 mEq/L (ref 3.7–5.3)
Sodium: 131 mEq/L — ABNORMAL LOW (ref 137–147)

## 2013-12-20 LAB — RAPID URINE DRUG SCREEN, HOSP PERFORMED
Amphetamines: NOT DETECTED
BARBITURATES: POSITIVE — AB
BENZODIAZEPINES: NOT DETECTED
Cocaine: POSITIVE — AB
OPIATES: POSITIVE — AB
Tetrahydrocannabinol: NOT DETECTED

## 2013-12-20 LAB — CBC
HEMATOCRIT: 26 % — AB (ref 39.0–52.0)
Hemoglobin: 7.8 g/dL — ABNORMAL LOW (ref 13.0–17.0)
MCH: 23.3 pg — ABNORMAL LOW (ref 26.0–34.0)
MCHC: 30 g/dL (ref 30.0–36.0)
MCV: 77.6 fL — ABNORMAL LOW (ref 78.0–100.0)
Platelets: 348 10*3/uL (ref 150–400)
RBC: 3.35 MIL/uL — AB (ref 4.22–5.81)
RDW: 18.1 % — AB (ref 11.5–15.5)
WBC: 5.8 10*3/uL (ref 4.0–10.5)

## 2013-12-20 LAB — I-STAT TROPONIN, ED: Troponin i, poc: 0.03 ng/mL (ref 0.00–0.08)

## 2013-12-20 LAB — TROPONIN I: Troponin I: <0.3 ng/mL (ref <0.30)

## 2013-12-20 LAB — PRO B NATRIURETIC PEPTIDE: Pro B Natriuretic peptide (BNP): 5934 pg/mL — ABNORMAL HIGH (ref 0–125)

## 2013-12-20 MED ORDER — ASPIRIN 81 MG PO CHEW
324.0000 mg | CHEWABLE_TABLET | Freq: Once | ORAL | Status: AC
  Administered 2013-12-20: 324 mg via ORAL
  Filled 2013-12-20: qty 4

## 2013-12-20 MED ORDER — CITALOPRAM HYDROBROMIDE 20 MG PO TABS
20.0000 mg | ORAL_TABLET | Freq: Every day | ORAL | Status: DC
  Administered 2013-12-21 – 2013-12-22 (×2): 20 mg via ORAL
  Filled 2013-12-20 (×2): qty 1

## 2013-12-20 MED ORDER — HYDRALAZINE HCL 50 MG PO TABS
50.0000 mg | ORAL_TABLET | Freq: Three times a day (TID) | ORAL | Status: DC
  Administered 2013-12-20 – 2013-12-22 (×6): 50 mg via ORAL
  Filled 2013-12-20 (×9): qty 1

## 2013-12-20 MED ORDER — DOCUSATE SODIUM 100 MG PO CAPS
100.0000 mg | ORAL_CAPSULE | Freq: Two times a day (BID) | ORAL | Status: DC
  Administered 2013-12-20 – 2013-12-21 (×3): 100 mg via ORAL
  Filled 2013-12-20 (×5): qty 1

## 2013-12-20 MED ORDER — SODIUM CHLORIDE 0.9 % IJ SOLN
3.0000 mL | Freq: Two times a day (BID) | INTRAMUSCULAR | Status: DC
  Administered 2013-12-20 – 2013-12-22 (×4): 3 mL via INTRAVENOUS

## 2013-12-20 MED ORDER — MORPHINE SULFATE 4 MG/ML IJ SOLN
4.0000 mg | INTRAMUSCULAR | Status: DC | PRN
  Administered 2013-12-20 – 2013-12-22 (×6): 4 mg via INTRAVENOUS
  Filled 2013-12-20 (×7): qty 1

## 2013-12-20 MED ORDER — ASPIRIN EC 81 MG PO TBEC
81.0000 mg | DELAYED_RELEASE_TABLET | Freq: Every day | ORAL | Status: DC
  Administered 2013-12-21 – 2013-12-22 (×2): 81 mg via ORAL
  Filled 2013-12-20 (×2): qty 1

## 2013-12-20 MED ORDER — FUROSEMIDE 10 MG/ML IJ SOLN
40.0000 mg | Freq: Once | INTRAMUSCULAR | Status: AC
  Administered 2013-12-20: 40 mg via INTRAVENOUS
  Filled 2013-12-20: qty 4

## 2013-12-20 MED ORDER — PHENYTOIN SODIUM EXTENDED 100 MG PO CAPS
200.0000 mg | ORAL_CAPSULE | Freq: Every day | ORAL | Status: DC
  Administered 2013-12-21 – 2013-12-22 (×2): 200 mg via ORAL
  Filled 2013-12-20 (×2): qty 2

## 2013-12-20 MED ORDER — HEPARIN SODIUM (PORCINE) 5000 UNIT/ML IJ SOLN
5000.0000 [IU] | Freq: Three times a day (TID) | INTRAMUSCULAR | Status: DC
  Filled 2013-12-20 (×6): qty 1

## 2013-12-20 MED ORDER — NITROGLYCERIN 0.4 MG SL SUBL
0.4000 mg | SUBLINGUAL_TABLET | SUBLINGUAL | Status: DC | PRN
  Administered 2013-12-20 (×2): 0.4 mg via SUBLINGUAL
  Filled 2013-12-20: qty 1

## 2013-12-20 MED ORDER — SIMVASTATIN 40 MG PO TABS
40.0000 mg | ORAL_TABLET | Freq: Every evening | ORAL | Status: DC
  Administered 2013-12-20: 40 mg via ORAL
  Filled 2013-12-20 (×2): qty 1

## 2013-12-20 MED ORDER — IPRATROPIUM-ALBUTEROL 0.5-2.5 (3) MG/3ML IN SOLN
3.0000 mL | Freq: Once | RESPIRATORY_TRACT | Status: AC
  Administered 2013-12-20: 3 mL via RESPIRATORY_TRACT
  Filled 2013-12-20: qty 3

## 2013-12-20 MED ORDER — CLONIDINE HCL 0.2 MG PO TABS
0.2000 mg | ORAL_TABLET | Freq: Two times a day (BID) | ORAL | Status: DC
  Administered 2013-12-21 – 2013-12-22 (×4): 0.2 mg via ORAL
  Filled 2013-12-20 (×5): qty 1

## 2013-12-20 MED ORDER — AMLODIPINE BESYLATE 10 MG PO TABS
10.0000 mg | ORAL_TABLET | Freq: Every day | ORAL | Status: DC
  Administered 2013-12-20 – 2013-12-22 (×3): 10 mg via ORAL
  Filled 2013-12-20: qty 2
  Filled 2013-12-20 (×2): qty 1

## 2013-12-20 MED ORDER — HEPARIN SODIUM (PORCINE) 5000 UNIT/ML IJ SOLN: 5000.0000 [IU] | Freq: Three times a day (TID) | INTRAMUSCULAR | Status: DC

## 2013-12-20 MED ORDER — FERROUS SULFATE 325 (65 FE) MG PO TABS
325.0000 mg | ORAL_TABLET | Freq: Every day | ORAL | Status: DC
  Administered 2013-12-21 – 2013-12-22 (×2): 325 mg via ORAL
  Filled 2013-12-20 (×2): qty 1

## 2013-12-20 MED ORDER — PANTOPRAZOLE SODIUM 40 MG PO TBEC
40.0000 mg | DELAYED_RELEASE_TABLET | Freq: Two times a day (BID) | ORAL | Status: DC
  Administered 2013-12-20 – 2013-12-22 (×4): 40 mg via ORAL
  Filled 2013-12-20 (×4): qty 1

## 2013-12-20 MED ORDER — GABAPENTIN 100 MG PO CAPS
100.0000 mg | ORAL_CAPSULE | Freq: Two times a day (BID) | ORAL | Status: DC
  Administered 2013-12-20 – 2013-12-22 (×4): 100 mg via ORAL
  Filled 2013-12-20 (×5): qty 1

## 2013-12-20 MED ORDER — SODIUM CHLORIDE 0.9 % IJ SOLN: 3.0000 mL | Freq: Two times a day (BID) | INTRAMUSCULAR | Status: DC

## 2013-12-20 MED ORDER — PHENYTOIN SODIUM EXTENDED 100 MG PO CAPS
300.0000 mg | ORAL_CAPSULE | Freq: Every day | ORAL | Status: DC
  Administered 2013-12-20 – 2013-12-21 (×2): 300 mg via ORAL
  Filled 2013-12-20 (×3): qty 3

## 2013-12-20 MED ORDER — ISOSORBIDE MONONITRATE ER 30 MG PO TB24
30.0000 mg | ORAL_TABLET | Freq: Every day | ORAL | Status: DC
  Administered 2013-12-20 – 2013-12-22 (×3): 30 mg via ORAL
  Filled 2013-12-20 (×3): qty 1

## 2013-12-20 NOTE — ED Provider Notes (Signed)
CSN: [id number]     Arrival date & time 12/20/13  1017 History   First MD Initiated Contact with Patient 12/20/13 1029     Chief Complaint  Patient presents with  . Shortness of Breath  . Chest Pain     (Consider location/radiation/quality/duration/timing/severity/associated sxs/prior Treatment) HPI 68 year old male presents with 6-7 hours of chest pain. States at 4 AM he started having coughing and noticed a sharp midsternal chest pain. His been feeling dizzy and weak as well as having shortness of breath. Is a little different than the chest pain he had a couple weeks ago but states it does feel similar to when he required his bypass surgery for an MI. Patient endorses his most recent cocaine use was 5 days ago. States he is having a productive cough. Denies a fevers. He states he always gets a cough but he is chest pain like this. Did not take anything for the pain. Denies focal leg swelling.  Past Medical History  Diagnosis Date  . Iron deficiency anemia   . Chronic combined systolic and diastolic CHF, NYHA class 2     a. 11/2013 Echo: EF 40-45%, basl-mid inflat AK, Gr 2 DD.  [name] Hyperlipidemia LDL goal <70   . Obstructive sleep apnea     a. not on home CPAP  . Ischemic cardiomyopathy     a. 11/2013 Echo: EF 40-45%, basl-mid inflat AK, Gr 2 DD, mild AI, mod dil LA/RA, mod reduced RV fxn, PASP 72mmHg.  [name] Homelessness   . Moderate to severe pulmonary hypertension 03/2010    a. PA peak pressure of 76 mmHg (per 2D Echo 03/2010)  . Polysubstance abuse     a. cocaine, THC  . AV malformation of gastrointestinal tract     a. w/ h/o GIB.  [name] Family history of early CAD   . DDD (degenerative disc disease), lumbar   . Peripheral vascular disease   . Seizure disorder   . Hypertension   . CAD (coronary artery disease)     a. s/p 3-vessel CABG (12/2007) // 100% RCA stenosis with collaterals from left system. Severe bifurcation lesions of proxima CXA and OM. Moderate LAD disease - followed by  Dr. Wyline Copas in [facility];  b. 11/2013 Myoview: Large fixed inf defect w/o ischemia, EF 35%.  . Diabetes   . Chronic kidney disease (CKD), stage III (moderate)     a. BL SCr 1.5-1.6  . Renal artery stenosis    Past Surgical History  Procedure Laterality Date  . Apc  03/2010    To treat small bowel AVMs  . Esophagogastroduodenoscopy N/A 08/14/2012    Procedure: ESOPHAGOGASTRODUODENOSCOPY (EGD);  Surgeon: [name], MD;  Location: [facility] ENDOSCOPY;  Service: Endoscopy;  Laterality: N/A;  . Hot hemostasis N/A 08/14/2012    Procedure: HOT HEMOSTASIS (ARGON PLASMA COAGULATION/BICAP);  Surgeon: [name], MD;  Location: [facility] ENDOSCOPY;  Service: Endoscopy;  Laterality: N/A;  . Esophagogastroduodenoscopy (egd) with propofol N/A 08/04/2013    Procedure: ESOPHAGOGASTRODUODENOSCOPY (EGD) WITH PROPOFOL;  Surgeon: [name], MD;  Location: [facility] ENDOSCOPY;  Service: Endoscopy;  Laterality: N/A;  . Coronary artery bypass graft  12/2007   Family History  Problem Relation Age of Onset  . Heart disease Mother     unknown type  . Heart disease Father 34    died of MI at 54yo  . Heart disease Paternal Grandfather 60    died of MI  . Heart disease    . Heart disease Brother 52  History  Substance Use Topics  . Smoking status: Current Some Day Smoker -- 0.25 packs/day for 56 years    Types: Cigarettes  . Smokeless tobacco: Never Used  . Alcohol Use: Yes     Comment: occasionally drinks, a few times a month    Review of Systems  Constitutional: Negative for fever.  Respiratory: Positive for cough, shortness of breath and wheezing.   Cardiovascular: Positive for chest pain.  Gastrointestinal: Negative for vomiting and abdominal pain.  Neurological: Negative for headaches.  All other systems reviewed and are negative.     Allergies  Motrin and Tylenol  Home Medications   Prior to Admission medications   Medication Sig Start Date End Date Taking? Authorizing Provider  amLODipine (NORVASC)  10 MG tablet Take 10 mg by mouth daily.    Historical Provider, MD  aspirin EC 81 MG tablet Take 81 mg by mouth daily.    Historical Provider, MD  citalopram (CELEXA) 20 MG tablet Take 20 mg by mouth daily.    Historical Provider, MD  cloNIDine (CATAPRES) 0.2 MG tablet Take 0.2 mg by mouth 2 (two) times daily.    Historical Provider, MD  docusate sodium (COLACE) 100 MG capsule Take 100 mg by mouth 2 (two) times daily.    Historical Provider, MD  ferrous sulfate 325 (65 FE) MG tablet Take 325 mg by mouth daily.     Historical Provider, MD  furosemide (LASIX) 80 MG tablet Take 80 mg by mouth daily.    Historical Provider, MD  gabapentin (NEURONTIN) 100 MG capsule Take 100 mg by mouth 2 (two) times daily.    Historical Provider, MD  hydrALAZINE (APRESOLINE) 50 MG tablet Take 50 mg by mouth 3 (three) times daily.    Historical Provider, MD  isosorbide mononitrate (IMDUR) 30 MG 24 hr tablet Take 30 mg by mouth daily.    Historical Provider, MD  pantoprazole (PROTONIX) 40 MG tablet Take 40 mg by mouth 2 (two) times daily.    Historical Provider, MD  phenytoin (DILANTIN) 100 MG ER capsule Take 200-300 mg by mouth 2 (two) times daily. 200mg  in the morning, 300mg  in the evening    Historical Provider, MD  simvastatin (ZOCOR) 40 MG tablet Take 40 mg by mouth every evening.    Historical Provider, MD   BP 204/113  Pulse 90  Temp(Src) 98.4 F (36.9 C) (Oral)  Resp 26  Wt 175 lb (79.379 kg)  SpO2 96% Physical Exam  Nursing note and vitals reviewed. Constitutional: He is oriented to person, place, and time. He appears well-developed and well-nourished. No distress.  HENT:  Head: Normocephalic and atraumatic.  Right Ear: External ear normal.  Left Ear: External ear normal.  Nose: Nose normal.  Eyes: Right eye exhibits no discharge. Left eye exhibits no discharge.  Neck: Neck supple.  Cardiovascular: Normal rate, regular rhythm, normal heart sounds and intact distal pulses.   Pulmonary/Chest: Effort  normal. He has wheezes. He has rhonchi. He exhibits no tenderness.  Abdominal: Soft. He exhibits no distension. There is no tenderness.  Musculoskeletal: He exhibits no edema.  Neurological: He is alert and oriented to person, place, and time.  Skin: Skin is warm and dry.    ED Course  Procedures (including critical care time) Labs Review Labs Reviewed  CBC - Abnormal; Notable for the following:    RBC 3.35 (*)    Hemoglobin 7.8 (*)    HCT 26.0 (*)    MCV 77.6 (*)    [facility]  23.3 (*)    RDW 18.1 (*)    All other components within normal limits  BASIC METABOLIC PANEL - Abnormal; Notable for the following:    Sodium 131 (*)    CO2 18 (*)    GFR calc non Af Amer 70 (*)    GFR calc Af Amer 82 (*)    All other components within normal limits  PRO B NATRIURETIC PEPTIDE - Abnormal; Notable for the following:    Pro B Natriuretic peptide (BNP) 5934.0 (*)    All other components within normal limits  I-STAT TROPOININ, ED    Imaging Review Dg Chest 2 View  12/20/2013   CLINICAL DATA:  Bilateral chest pain, dizziness and nausea since 4 a.m. today. Chills for the past hr. Initial encounter.  EXAM: CHEST  2 VIEW  COMPARISON:  12/13/2013; 10/23/2013; chest CT -10/23/2013  FINDINGS: Grossly unchanged enlarged cardiac silhouette and mediastinal contours post median sternotomy and CABG. Evaluation the retrosternal clear space obscured secondary to overlying soft tissues. Chronic pulmonary venous congestion without frank evidence of edema. There is persistent pleural parenchymal thickening about the peripheral aspect the right minor fissure. Grossly unchanged bibasilar heterogeneous opacities favored to represent atelectasis. No new focal airspace opacities. No pleural effusion or pneumothorax. Unchanged bones. Surgical clips overlie the left lung apex.  IMPRESSION: Cardiomegaly and pulmonary venous congestion without acute cardiopulmonary disease.   Electronically Signed   By: Sandi Mariscal M.D.   On:  12/20/2013 13:16     EKG Interpretation   Date/Time:  Saturday December 20 2013 10:21:01 EDT Ventricular Rate:  91 PR Interval:  185 QRS Duration: 113 QT Interval:  356 QTC Calculation: 438 R Axis:   64 Text Interpretation:  Sinus rhythm Probable left atrial enlargement  Incomplete right bundle branch block Probable inferior infarct, age  indeterminate Lateral leads are also involved Baseline wander in lead(s)  V3 No significant change since last tracing Confirmed by Matteson Blue  MD,  Gio Janoski ([id number]) on 12/20/2013 10:31:09 AM      MDM   Final diagnoses:  Chest pain, unspecified chest pain type    Patient here with recurrent chest pain. He is admitted frequently for this. There is no new EKG changes or elevated troponin to suggest that this is an MI. He does have an abnormally elevated BNP that does not seem significantly different than prior. I have very low suspicion this is pulmonary embolism. After one aspirin and one nitroglycerin he was asleep on my reevaluation. Given his significant risk factors, however, will consult internal medicine and get their opinion on admission versus discharge with close followup. Care transferred with this consult pending.    Ephraim Hamburger, MD 12/20/13 [phone]

## 2013-12-20 NOTE — ED Notes (Signed)
Pt requesting something for pain in chest- "like that medicine that starts with a D"

## 2013-12-20 NOTE — ED Notes (Signed)
Pt has returned from being out of the department; pt placed back on monitor, continuous pulse oximetry and blood pressure cuff; pt now watching tv and asking for pain medicine; will let Karen,RN aware

## 2013-12-20 NOTE — H&P (Signed)
Date: 12/20/2013               Patient Name:  Charles [name] MRN: [id number]  DOB: 09-14-1945 Age / Sex: 68 y.o., male   PCP: [name]         Medical Service: Internal Medicine Teaching Service         Attending Physician: Dr. Provider Default, MD    First Contact: Dr. Drucilla Schmidt Pager: [phone]  Second Contact: Dr. Duwaine Maxin Pager: [phone]       After Hours (After 5p/  First Contact Pager: [phone]  weekends / holidays): Second Contact Pager: [phone]   Chief Complaint: chest pain, dizziness, dyspnea  History of Present Illness:   Mr. Vanlanen is 68 yo man with HFpEF, CAD (s/p 3v CABG in 2009), anemia (h/o antral AVM and GI bleed, ablated in 07/2013), CKD stage II, DMII and cocaine abuse who is admitted for chest pain, dyspnea, and dizziness. Patient reports that the chest pain started this morning around 4 AM, it is a burning sensation, and is spread all over both sides of his chest and abdomen. Patient also reports waking up coughing up clear sputum along with having some dyspnea and chills with coughing. She also reports generalized bloating in his abdominal area with no focal pain. Patient denies any worsening of his chest pain or dyspnea with exertion. He says that the nitroglycerin that he has received in the emergency department has not helped with his pain at all. He does report that morphine, which he received at a recent hospitalization, makes the pain much better.   Patient was recently admitted between 12/13/2013 in 12/15/2013 for similar symptoms. A cardiac workup at that point was negative. Patient has also been admitted around 6 times in the last 8 months. He does report taking his Lasix at home, though only 40 mg instead of the 80 mg daily prescribed at discharge. Patient reports that his most recent cocaine use was 5 days ago.  Patient otherwise denies any fevers, constipation, diarrhea, nausea, or vomiting.  Meds: Current Facility-Administered  Medications  Medication Dose Route Frequency Provider Last Rate Last Dose  . amLODipine (NORVASC) tablet 10 mg  10 mg Oral Daily Ephraim Hamburger, MD   10 mg at 12/20/13 1614  . cloNIDine (CATAPRES) tablet 0.2 mg  0.2 mg Oral BID Ephraim Hamburger, MD      . hydrALAZINE (APRESOLINE) tablet 50 mg  50 mg Oral TID Ephraim Hamburger, MD   50 mg at 12/20/13 1614  . isosorbide mononitrate (IMDUR) 24 hr tablet 30 mg  30 mg Oral Daily Ephraim Hamburger, MD   30 mg at 12/20/13 1614  . morphine 4 MG/ML injection 4 mg  4 mg Intravenous Q4H PRN Luan Moore, MD      . nitroGLYCERIN (NITROSTAT) SL tablet 0.4 mg  0.4 mg Sublingual Q5 min PRN Ephraim Hamburger, MD   0.4 mg at 12/20/13 1523   Current Outpatient Prescriptions  Medication Sig Dispense Refill  . amLODipine (NORVASC) 10 MG tablet Take 10 mg by mouth daily.      [name] aspirin EC 81 MG tablet Take 81 mg by mouth daily.      . citalopram (CELEXA) 20 MG tablet Take 20 mg by mouth daily.      . cloNIDine (CATAPRES) 0.2 MG tablet Take 0.2 mg by mouth 2 (two) times daily.      [name] docusate sodium (COLACE) 100 MG capsule Take 100 mg by mouth  2 (two) times daily.      . ferrous sulfate 325 (65 FE) MG tablet Take 325 mg by mouth daily.       . furosemide (LASIX) 80 MG tablet Take 80 mg by mouth daily.      [name] gabapentin (NEURONTIN) 100 MG capsule Take 100 mg by mouth 2 (two) times daily.      . hydrALAZINE (APRESOLINE) 50 MG tablet Take 50 mg by mouth 3 (three) times daily.      . isosorbide mononitrate (IMDUR) 30 MG 24 hr tablet Take 30 mg by mouth daily.      . pantoprazole (PROTONIX) 40 MG tablet Take 40 mg by mouth 2 (two) times daily.      . phenytoin (DILANTIN) 100 MG ER capsule Take 200-300 mg by mouth 2 (two) times daily. 200mg  in the morning, 300mg  in the evening      . simvastatin (ZOCOR) 40 MG tablet Take 40 mg by mouth every evening.        Allergies: Allergies as of 12/20/2013 - Review Complete 12/20/2013  Allergen Reaction Noted  . Motrin  [ibuprofen] Other (See Comments) 05/16/2010  . Tylenol [acetaminophen] Other (See Comments) 05/16/2010   Past Medical History  Diagnosis Date  . Iron deficiency anemia   . Chronic combined systolic and diastolic CHF, NYHA class 2     a. 11/2013 Echo: EF 40-45%, basl-mid inflat AK, Gr 2 DD.  [name] Hyperlipidemia LDL goal <70   . Obstructive sleep apnea     a. not on home CPAP  . Ischemic cardiomyopathy     a. 11/2013 Echo: EF 40-45%, basl-mid inflat AK, Gr 2 DD, mild AI, mod dil LA/RA, mod reduced RV fxn, PASP 41mmHg.  [name] Homelessness   . Moderate to severe pulmonary hypertension 03/2010    a. PA peak pressure of 76 mmHg (per 2D Echo 03/2010)  . Polysubstance abuse     a. cocaine, THC  . AV malformation of gastrointestinal tract     a. w/ h/o GIB.  [name] Family history of early CAD   . DDD (degenerative disc disease), lumbar   . Peripheral vascular disease   . Seizure disorder   . Hypertension   . CAD (coronary artery disease)     a. s/p 3-vessel CABG (12/2007) // 100% RCA stenosis with collaterals from left system. Severe bifurcation lesions of proxima CXA and OM. Moderate LAD disease - followed by Dr. Wyline Copas in [facility];  b. 11/2013 Myoview: Large fixed inf defect w/o ischemia, EF 35%.  . Diabetes   . Chronic kidney disease (CKD), stage III (moderate)     a. BL SCr 1.5-1.6  . Renal artery stenosis    Past Surgical History  Procedure Laterality Date  . Apc  03/2010    To treat small bowel AVMs  . Esophagogastroduodenoscopy N/A 08/14/2012    Procedure: ESOPHAGOGASTRODUODENOSCOPY (EGD);  Surgeon: [name], MD;  Location: [facility] ENDOSCOPY;  Service: Endoscopy;  Laterality: N/A;  . Hot hemostasis N/A 08/14/2012    Procedure: HOT HEMOSTASIS (ARGON PLASMA COAGULATION/BICAP);  Surgeon: [name], MD;  Location: [facility] ENDOSCOPY;  Service: Endoscopy;  Laterality: N/A;  . Esophagogastroduodenoscopy (egd) with propofol N/A 08/04/2013    Procedure: ESOPHAGOGASTRODUODENOSCOPY (EGD) WITH PROPOFOL;   Surgeon: [name], MD;  Location: [facility] ENDOSCOPY;  Service: Endoscopy;  Laterality: N/A;  . Coronary artery bypass graft  12/2007   Family History  Problem Relation Age of Onset  . Heart disease Mother     unknown  type  . Heart disease Father 4    died of MI at 46yo  . Heart disease Paternal Grandfather 60    died of MI  . Heart disease    . Heart disease Brother 30   History   Social History  . Marital Status: Single    Spouse Name: N/A    Number of Children: 2  . Years of Education: N/A   Occupational History  . Unemployed     previously worked in [employer]  .     Social History Main Topics  . Smoking status: Current Some Day Smoker -- 0.25 packs/day for 56 years    Types: Cigarettes  . Smokeless tobacco: Never Used  . Alcohol Use: Yes     Comment: occasionally drinks, a few times a month  . Drug Use: 1.00 per week    Special: Cocaine, Marijuana     Comment: uses cocaine once/wk at least.  . Sexual Activity: No   Other Topics Concern  . Not on file   Social History Narrative   Lives in [place]. Originally from [place], has been in the [place] since 1980s      **11/2013 - says that he lives with dtr in [facility].**                      Review of Systems: Pertinent items are noted in HPI.  Physical Exam: Blood pressure 186/83, pulse 75, temperature 98.4 F (36.9 C), temperature source Oral, resp. rate 24, weight 175 lb (79.379 kg), SpO2 98.00%. General: resting in bed HEENT: PERRL, EOMI, no scleral icterus Cardiac: RRR, no rubs, murmurs or gallops Pulm: bilateral basilar rales Abd: soft, nontender, nondistended, BS present Ext: warm and well perfused, trace edema bilateral lower extremities Neuro: alert and oriented X3, cranial nerves II-XII grossly intact Skin: no rashes or lesions noted Psych: appropriate affect and cognition  Lab results: Basic Metabolic Panel:  Recent Labs  12/20/13 1032  NA 131*  K 4.3  CL 99    CO2 18*  GLUCOSE 93  BUN 20  CREATININE 1.05  CALCIUM 8.9   Liver Function Tests: No results found for this basename: AST, ALT, ALKPHOS, BILITOT, PROT, ALBUMIN,  in the last 72 hours No results found for this basename: LIPASE, AMYLASE,  in the last 72 hours No results found for this basename: AMMONIA,  in the last 72 hours CBC:  Recent Labs  12/20/13 1032  WBC 5.8  HGB 7.8*  HCT 26.0*  MCV 77.6*  PLT 348   Cardiac Enzymes: No results found for this basename: CKTOTAL, CKMB, CKMBINDEX, TROPONINI,  in the last 72 hours BNP:  Recent Labs  12/20/13 1032  PROBNP 5934.0*   D-Dimer: No results found for this basename: DDIMER,  in the last 72 hours CBG: No results found for this basename: GLUCAP,  in the last 72 hours Hemoglobin A1C: No results found for this basename: HGBA1C,  in the last 72 hours Fasting Lipid Panel: No results found for this basename: CHOL, HDL, LDLCALC, TRIG, CHOLHDL, LDLDIRECT,  in the last 72 hours Thyroid Function Tests: No results found for this basename: TSH, T4TOTAL, FREET4, T3FREE, THYROIDAB,  in the last 72 hours Anemia Panel: No results found for this basename: VITAMINB12, FOLATE, FERRITIN, TIBC, IRON, RETICCTPCT,  in the last 72 hours Coagulation: No results found for this basename: LABPROT, INR,  in the last 72 hours Urine Drug Screen: Drugs of Abuse     Component  Value Date/Time   LABOPIA POSITIVE* 12/13/2013 2310   COCAINSCRNUR POSITIVE* 12/13/2013 2310   LABBENZ NONE DETECTED 12/13/2013 2310   AMPHETMU NONE DETECTED 12/13/2013 2310   THCU POSITIVE* 12/13/2013 2310   LABBARB NONE DETECTED 12/13/2013 2310    Alcohol Level: No results found for this basename: ETH,  in the last 72 hours Urinalysis: No results found for this basename: COLORURINE, APPERANCEUR, LABSPEC, PHURINE, GLUCOSEU, HGBUR, BILIRUBINUR, KETONESUR, PROTEINUR, UROBILINOGEN, NITRITE, LEUKOCYTESUR,  in the last 72 hours  Imaging results:  Dg Chest 2 View  12/20/2013    CLINICAL DATA:  Bilateral chest pain, dizziness and nausea since 4 a.m. today. Chills for the past hr. Initial encounter.  EXAM: CHEST  2 VIEW  COMPARISON:  12/13/2013; 10/23/2013; chest CT -10/23/2013  FINDINGS: Grossly unchanged enlarged cardiac silhouette and mediastinal contours post median sternotomy and CABG. Evaluation the retrosternal clear space obscured secondary to overlying soft tissues. Chronic pulmonary venous congestion without frank evidence of edema. There is persistent pleural parenchymal thickening about the peripheral aspect the right minor fissure. Grossly unchanged bibasilar heterogeneous opacities favored to represent atelectasis. No new focal airspace opacities. No pleural effusion or pneumothorax. Unchanged bones. Surgical clips overlie the left lung apex.  IMPRESSION: Cardiomegaly and pulmonary venous congestion without acute cardiopulmonary disease.   Electronically Signed   By: Sandi Mariscal M.D.   On: 12/20/2013 13:16    Other results: EKG: normal EKG, normal sinus rhythm.  Assessment & Plan by Problem: Principal Problem:   Acute on chronic combined systolic and diastolic heart failure Active Problems:   Hypertension   CAD- CABG x3 '09. Myoview no ischemia 11/27/13   Chronic kidney disease (CKD), stage III (moderate)   Polysubstance abuse   Cocaine abuse   Anemia, iron deficiency   Chest pain  Mr. Bucklin is 68 yo man with HFpEF, CAD (s/p 3v CABG in 2009), anemia (h/o antral AVM and GI bleed, ablated in 07/2013), CKD stage II, DMII and cocaine abuse who is admitted for chest pain, dyspnea, and dizziness, likely secondary to acute on chronic congestive heart failure.   Acute on chronic congestive heart failure: Echo from 11/27/2013 showing LVEF of 40-45% and grade 2 diastolic dysfunction. proBNP down to 5934 from 6456 last admission. Chest x-ray remarkable for cardiomegaly and pulmonary congestion but no major edema.  However, patient's dry weight is seems to be around 160  lbs from May 2015, currently 175 lbs. Physical exam findings also compelling for pulmonary edema. Patient has also not been taking the full dosage of Lasix that he was prescribed on discharge his last hospitalization (Lasix 80 mg). No concerning EKG changes and troponin negative x 1.   - Lasix 40 mg IV  - Supplement potassium as needed  - Strict I.'s and O.'s  - Daily weights  Iron Deficiency Anemia: Admission hemoglobin 7.8. Workup from recent admission consistent with iron deficiency anemia. 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 During the last admission, GI assessed the patient in recommended continued iron supplementation and PPI twice a day. - Continue home iron supplementation  Hypertension: Patient admitted with markedly elevated blood pressures, roughly 200/100. Patient has been compliant with his blood pressure medications. His hypertension is likely exacerbated by his cocaine use. - Continue home amlodipine 10 mg daily, clonidine 0.2 mg twice a day.  Coronary artery disease (status post 3 vessel CABG in 2009): Chest pain is nonexertional, not responsive to nitroglycerin. EKG not showing evidence of ischemic changes. Troponins  negative x1. Unlikely that current chest pain is cardiac in etiology.  - Cycle troponins  - continue home aspirin 81 mg  Cocaine Abuse: Patient states that he last used cocaine 5 days ago.   - Will counsel patient about cessation.  - Social work consult  CKD Stage II: Patient at baseline creatinine upon admission.   Diet: heart Prophylaxis: SCDs and heparin Code: Full  Dispo: Disposition is deferred at this time, awaiting improvement of current medical problems. Anticipated discharge in approximately 1 day(s).   The patient does have a current PCP [name]) and does need an [facility] hospital follow-up appointment after discharge.  The patient does not have transportation limitations that hinder transportation to  clinic appointments.  Signed: Luan Moore, M.D., Ph.D. Internal Medicine Teaching Service, PGY-1 12/20/2013, 5:53 PM

## 2013-12-20 NOTE — ED Notes (Signed)
Myself and Levada Dy, RN undressed pt, placed in gown, on monitor, continuous pulse oximetry and blood pressure cuff

## 2013-12-20 NOTE — ED Notes (Signed)
Patient sitting on the side of the bed to eat.

## 2013-12-20 NOTE — ED Notes (Signed)
Pt asking when he is going upstairs and states " I still have not gotten anything for pain". Explained that there were not any orders for pain medicine at present. Moaning with breathing,

## 2013-12-20 NOTE — H&P (Signed)
         Attending progress note    Date of Admission:  12/20/2013     Principal Problem:   Acute on chronic combined systolic and diastolic heart failure Active Problems:   Hypertension   CAD- CABG x3 '09. Myoview no ischemia 11/27/13   Chronic kidney disease (CKD), stage III (moderate)   Polysubstance abuse   Cocaine abuse   Anemia, iron deficiency   Chest pain   . amLODipine  10 mg Oral Daily  . cloNIDine  0.2 mg Oral BID  . hydrALAZINE  50 mg Oral TID  . isosorbide mononitrate  30 mg Oral Daily    I have seen and examined Mr. Hernon with our team. He states that he awoke this morning with worsening shortness of breath, chest pain and productive cough. He has been hospitalized on multiple occasions recently with decompensated heart failure. He was recently instructed to increase his Lasix to 80 mg daily but he has continued to take 40 mg daily stating that his pharmacy is still sending him his 40 mg dose. He has continued to use cocaine intermittently stating that he last used on September 28. He does not have scales at home and does not have any way to weigh himself. Her recent discharge summary indicated his dry weight was 166. His current weight is 175 pounds. He has distended neck veins, diffuse crackles and increased markings on chest x-ray. He is hypertensive. He has no acute EKG changes and his initial troponin is normal. His chest pain did not improve with nitroglycerin but did respond to morphine. We will watch him in the hospital at least overnight and plan on diuresis.  Michel Bickers, MD [facility] [phone] pager   [phone] cell 12/20/2013, 4:35 PM

## 2013-12-20 NOTE — ED Notes (Addendum)
To [facility] via [employer] [facility] from home, with c/o shortness of breath started after coughing at 4am. also c/o chest pain-- medsternal-- no change with movement or resp. Pt alert/oriented on arrival-- coughing, productive for white sputum.

## 2013-12-20 NOTE — ED Notes (Signed)
Pt c/o continued chest pain, moaning with every respiration. Dr. Regenia Skeeter notified-- orders to repeat NTG.

## 2013-12-20 NOTE — ED Notes (Signed)
Pt watching tv, asking for pain medicine; Santiago Glad, RN aware

## 2013-12-21 ENCOUNTER — Encounter (HOSPITAL_COMMUNITY): Payer: Self-pay | Admitting: *Deleted

## 2013-12-21 DIAGNOSIS — R079 Chest pain, unspecified: Secondary | ICD-10-CM | POA: Diagnosis not present

## 2013-12-21 LAB — MRSA PCR SCREENING: MRSA by PCR: POSITIVE — AB

## 2013-12-21 LAB — INFLUENZA PANEL BY PCR (TYPE A & B)
H1N1FLUPCR: NOT DETECTED
INFLAPCR: NEGATIVE
INFLBPCR: NEGATIVE

## 2013-12-21 LAB — TROPONIN I: Troponin I: <0.3 ng/mL (ref <0.30)

## 2013-12-21 LAB — BASIC METABOLIC PANEL
ANION GAP: 14 (ref 5–15)
BUN: 19 mg/dL (ref 6–23)
CALCIUM: 8.7 mg/dL (ref 8.4–10.5)
CO2: 21 meq/L (ref 19–32)
CREATININE: 1.07 mg/dL (ref 0.50–1.35)
Chloride: 103 mEq/L (ref 96–112)
GFR calc Af Amer: 80 mL/min — ABNORMAL LOW (ref >90)
GFR calc non Af Amer: 69 mL/min — ABNORMAL LOW (ref >90)
GLUCOSE: 115 mg/dL — AB (ref 70–99)
Potassium: 4 mEq/L (ref 3.7–5.3)
Sodium: 138 mEq/L (ref 137–147)

## 2013-12-21 MED ORDER — FUROSEMIDE 10 MG/ML IJ SOLN
80.0000 mg | Freq: Once | INTRAMUSCULAR | Status: AC
  Administered 2013-12-21: 80 mg via INTRAVENOUS
  Filled 2013-12-21: qty 8

## 2013-12-21 MED ORDER — ATORVASTATIN CALCIUM 20 MG PO TABS
20.0000 mg | ORAL_TABLET | Freq: Every day | ORAL | Status: DC
  Administered 2013-12-21: 20 mg via ORAL
  Filled 2013-12-21 (×2): qty 1

## 2013-12-21 MED ORDER — FUROSEMIDE 10 MG/ML IJ SOLN
40.0000 mg | Freq: Once | INTRAMUSCULAR | Status: AC
  Administered 2013-12-21: 40 mg via INTRAVENOUS
  Filled 2013-12-21: qty 4

## 2013-12-21 MED ORDER — CHLORHEXIDINE GLUCONATE CLOTH 2 % EX PADS
6.0000 | MEDICATED_PAD | Freq: Every day | CUTANEOUS | Status: DC
  Administered 2013-12-21 – 2013-12-22 (×2): 6 via TOPICAL

## 2013-12-21 MED ORDER — MUPIROCIN 2 % EX OINT
1.0000 "application " | TOPICAL_OINTMENT | Freq: Two times a day (BID) | CUTANEOUS | Status: DC
  Administered 2013-12-21 – 2013-12-22 (×3): 1 via NASAL
  Filled 2013-12-21 (×3): qty 22

## 2013-12-21 NOTE — Discharge Summary (Signed)
Name: Charles [name] MRN: [id number] DOB: December 17, 1945 68 y.o. 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 Myoview no ischemia 11/27/13   Chronic kidney disease (CKD), stage III (moderate)   Polysubstance abuse   Cocaine abuse   Anemia, iron deficiency   Chest pain   Acute on chronic systolic heart failure  Discharge Medications:   Medication List    ASK your doctor about these medications       amLODipine 10 MG tablet  Commonly known as:  NORVASC  Take 10 mg by mouth daily.     aspirin EC 81 MG tablet  Take 81 mg by mouth daily.     citalopram 20 MG tablet  Commonly known as:  CELEXA  Take 20 mg by mouth daily.     cloNIDine 0.2 MG tablet  Commonly known as:  CATAPRES  Take 0.2 mg by mouth 2 (two) times daily.     docusate sodium 100 MG capsule  Commonly known as:  COLACE  Take 100 mg by mouth 2 (two) times daily.     ferrous sulfate 325 (65 FE) MG tablet  Take 325 mg by mouth daily.     furosemide 80 MG tablet  Commonly known as:  LASIX  Take 80 mg by mouth daily.     gabapentin 100 MG capsule  Commonly known as:  NEURONTIN  Take 100 mg by mouth 2 (two) times daily.     hydrALAZINE 50 MG tablet  Commonly known as:  APRESOLINE  Take 50 mg by mouth 3 (three) times daily.     isosorbide mononitrate 30 MG 24 hr tablet  Commonly known as:  IMDUR  Take 30 mg by mouth daily.     pantoprazole 40 MG tablet  Commonly known as:  PROTONIX  Take 40 mg by mouth 2 (two) times daily.     phenytoin 100 MG ER capsule  Commonly known as:  DILANTIN  Take 200-300 mg by mouth 2 (two) times daily. 200mg  in the morning, 300mg  in the evening     simvastatin 40 MG tablet  Commonly known as:  ZOCOR  Take 40 mg by mouth every  evening.        Disposition and follow-up:   Charles [name] was discharged from [facility] in Stable condition.  At the hospital follow up visit please address:  1.  Confirm that patient is still taking 80 mg furosemide daily, as pharmacy previously delivering medications to wrong address.   2.  Labs / imaging needed at time of follow-up: None   3.  Pending labs/ test needing follow-up: None  Follow-up Appointments:   [name] On 12/23/2013 Please follow up with previously scheduled appointment with Dr. Alroy Dust tomorrow [address], [address] [facility] [place] [address] [phone]  Discharge Instructions:  It is very important that you take your lasix - this medication can help control your heart failure and keep you out of the hospital!   Using cocaine can makes your symptoms worse, and ultimately could cause major damage to your heart.   Heart Failure Heart failure is a condition in which the heart has trouble pumping blood. This means your heart does not pump blood efficiently for your body  to work well. In some cases of heart failure, fluid may back up into your lungs or you may have swelling (edema) in your lower legs. Heart failure is usually a long-term (chronic) condition. It is important for you to take good care of yourself and follow your health care provider's treatment plan. CAUSES  Some health conditions can cause heart failure. Those health conditions include:  High blood pressure (hypertension). Hypertension causes the heart muscle to work harder than normal. When pressure in the blood vessels is high, the heart needs to pump (contract) with more force in order to circulate blood throughout the body. High blood pressure eventually causes the heart to become stiff and weak.  Coronary artery disease (CAD). CAD is the buildup of cholesterol and fat (plaque) in the arteries of the heart. The blockage in  the arteries deprives the heart muscle of oxygen and blood. This can cause chest pain and may lead to a heart attack. High blood pressure can also contribute to CAD.  Heart attack (myocardial infarction). A heart attack occurs when one or more arteries in the heart become blocked. The loss of oxygen damages the muscle tissue of the heart. When this happens, part of the heart muscle dies. The injured tissue does not contract as well and weakens the heart's ability to pump blood.  Abnormal heart valves. When the heart valves do not open and close properly, it can cause heart failure. This makes the heart muscle pump harder to keep the blood flowing.  Heart muscle disease (cardiomyopathy or myocarditis). Heart muscle disease is damage to the heart muscle from a variety of causes. These can include drug or alcohol abuse, infections, or unknown reasons. These can increase the risk of heart failure.  Lung disease. Lung disease makes the heart work harder because the lungs do not work properly. This can cause a strain on the heart, leading it to fail.  Diabetes. Diabetes increases the risk of heart failure. High blood sugar contributes to high fat (lipid) levels in the blood. Diabetes can also cause slow damage to tiny blood vessels that carry important nutrients to the heart muscle. When the heart does not get enough oxygen and food, it can cause the heart to become weak and stiff. This leads to a heart that does not contract efficiently.  Other conditions can contribute to heart failure. These include abnormal heart rhythms, thyroid problems, and low blood counts (anemia). Certain unhealthy behaviors can increase the risk of heart failure, including:  Being overweight.  Smoking or chewing tobacco.  Eating foods high in fat and cholesterol.  Abusing illicit drugs or alcohol.  Lacking physical activity. SYMPTOMS  Heart failure symptoms may vary and can be hard to detect. Symptoms may  include:  Shortness of breath with activity, such as climbing stairs.  Persistent cough.  Swelling of the feet, ankles, legs, or abdomen.  Unexplained weight gain.  Difficulty breathing when lying flat (orthopnea).  Waking from sleep because of the need to sit up and get more air.  Rapid heartbeat.  Fatigue and loss of energy.  Feeling light-headed, dizzy, or close to fainting.  Loss of appetite.  Nausea.  Increased urination during the night (nocturia). DIAGNOSIS  A diagnosis of heart failure is based on your history, symptoms, physical examination, and diagnostic tests. Diagnostic tests for heart failure may include:  Echocardiography.  Electrocardiography.  Chest X-ray.  Blood tests.  Exercise stress test.  Cardiac angiography.  Radionuclide scans. TREATMENT  Treatment is aimed at managing  the symptoms of heart failure. Medicines, behavioral changes, or surgical intervention may be necessary to treat heart failure.  Medicines to help treat heart failure may include:  Angiotensin-converting enzyme (ACE) inhibitors. This type of medicine blocks the effects of a blood protein called angiotensin-converting enzyme. ACE inhibitors relax (dilate) the blood vessels and help lower blood pressure.  Angiotensin receptor blockers (ARBs). This type of medicine blocks the actions of a blood protein called angiotensin. Angiotensin receptor blockers dilate the blood vessels and help lower blood pressure.  Water pills (diuretics). Diuretics cause the kidneys to remove salt and water from the blood. The extra fluid is removed through urination. This loss of extra fluid lowers the volume of blood the heart pumps.  Beta blockers. These prevent the heart from beating too fast and improve heart muscle strength.  Digitalis. This increases the force of the heartbeat.  Healthy behavior changes include:  Obtaining and maintaining a healthy weight.  Stopping smoking or chewing  tobacco.  Eating heart-healthy foods.  Limiting or avoiding alcohol.  Stopping illicit drug use.  Physical activity as directed by your health care provider.  Surgical treatment for heart failure may include:  A procedure to open blocked arteries, repair damaged heart valves, or remove damaged heart muscle tissue.  A pacemaker to improve heart muscle function and control certain abnormal heart rhythms.  An internal cardioverter defibrillator to treat certain serious abnormal heart rhythms.  A left ventricular assist device (LVAD) to assist the pumping ability of the heart. HOME CARE INSTRUCTIONS   Take medicines only as directed by your health care provider. Medicines are important in reducing the workload of your heart, slowing the progression of heart failure, and improving your symptoms.  Do not stop taking your medicine unless directed by your health care provider.  Do not skip any dose of medicine.  Refill your prescriptions before you run out of medicine. Your medicines are needed every day.  Engage in moderate physical activity if directed by your health care provider. Moderate physical activity can benefit some people. The elderly and people with severe heart failure should consult with a health care provider for physical activity recommendations.  Eat heart-healthy foods. Food choices should be free of trans fat and low in saturated fat, cholesterol, and salt (sodium). Healthy choices include fresh or frozen fruits and vegetables, fish, lean meats, legumes, fat-free or low-fat dairy products, and whole grain or high fiber foods. Talk to a dietitian to learn more about heart-healthy foods.  Limit sodium if directed by your health care provider. Sodium restriction may reduce symptoms of heart failure in some people. Talk to a dietitian to learn more about heart-healthy seasonings.  Use healthy cooking methods. Healthy cooking methods include roasting, grilling, broiling,  baking, poaching, steaming, or stir-frying. Talk to a dietitian to learn more about healthy cooking methods.  Limit fluids if directed by your health care provider. Fluid restriction may reduce symptoms of heart failure in some people.  Weigh yourself every day. Daily weights are important in the early recognition of excess fluid. You should weigh yourself every morning after you urinate and before you eat breakfast. Wear the same amount of clothing each time you weigh yourself. Record your daily weight. Provide your health care provider with your weight record.  Monitor and record your blood pressure if directed by your health care provider.  Check your pulse if directed by your health care provider.  Lose weight if directed by your health care provider. Weight loss may reduce  symptoms of heart failure in some people.  Stop smoking or chewing tobacco. Nicotine makes your heart work harder by causing your blood vessels to constrict. Do not use nicotine gum or patches before talking to your health care provider.  Keep all follow-up visits as directed by your health care provider. This is important.  Limit alcohol intake to no more than 1 drink per day for nonpregnant women and 2 drinks per day for men. One drink equals 12 ounces of beer, 5 ounces of wine, or 1 ounces of hard liquor. Drinking more than that is harmful to your heart. Tell your health care provider if you drink alcohol several times a week. Talk with your health care provider about whether alcohol is safe for you. If your heart has already been damaged by alcohol or you have severe heart failure, drinking alcohol should be stopped completely.  Stop illicit drug use.  Stay up-to-date with immunizations. It is especially important to prevent respiratory infections through current pneumococcal and influenza immunizations.  Manage other health conditions such as hypertension, diabetes, thyroid disease, or abnormal heart rhythms as  directed by your health care provider.  Learn to manage stress.  Plan rest periods when fatigued.  Learn strategies to manage high temperatures. If the weather is extremely hot:  Avoid vigorous physical activity.  Use air conditioning or fans or seek a cooler location.  Avoid caffeine and alcohol.  Wear loose-fitting, lightweight, and light-colored clothing.  Learn strategies to manage cold temperatures. If the weather is extremely cold:  Avoid vigorous physical activity.  Layer clothes.  Wear mittens or gloves, a hat, and a scarf when going outside.  Avoid alcohol.  Obtain ongoing education and support as needed.  Participate in or seek rehabilitation as needed to maintain or improve independence and quality of life. SEEK MEDICAL CARE IF:   Your weight increases by 03 lb/1.4 kg in 1 day or 05 lb/2.3 kg in a week.  You have increasing shortness of breath that is unusual for you.  You are unable to participate in your usual physical activities.  You tire easily.  You cough more than normal, especially with physical activity.  You have any or more swelling in areas such as your hands, feet, ankles, or abdomen.  You are unable to sleep because it is hard to breathe.  You feel like your heart is beating fast (palpitations).  You become dizzy or light-headed upon standing up. SEEK IMMEDIATE MEDICAL CARE IF:   You have difficulty breathing.  There is a change in mental status such as decreased alertness or difficulty with concentration.  You have a pain or discomfort in your chest.  You have an episode of fainting (syncope). MAKE SURE YOU:   Understand these instructions.  Will watch your condition.  Will get help right away if you are not doing well or get worse. Document Released: 03/06/2005 Document Revised: 07/21/2013 Document Reviewed: 04/05/2012 [facility] Patient Information 2015 [place], [place]. This information is not intended to replace advice given  to you by your health care provider. Make sure you discuss any questions you have with your health care provider.     Procedures Performed:  Dg Chest 2 View  12/20/2013   CLINICAL DATA:  Bilateral chest pain, dizziness and nausea since 4 a.m. today. Chills for the past hr. Initial encounter.  EXAM: CHEST  2 VIEW  COMPARISON:  12/13/2013; 10/23/2013; chest CT -10/23/2013  FINDINGS: Grossly unchanged enlarged cardiac silhouette and mediastinal contours post median sternotomy and  CABG. Evaluation the retrosternal clear space obscured secondary to overlying soft tissues. Chronic pulmonary venous congestion without frank evidence of edema. There is persistent pleural parenchymal thickening about the peripheral aspect the right minor fissure. Grossly unchanged bibasilar heterogeneous opacities favored to represent atelectasis. No new focal airspace opacities. No pleural effusion or pneumothorax. Unchanged bones. Surgical clips overlie the left lung apex.  IMPRESSION: Cardiomegaly and pulmonary venous congestion without acute cardiopulmonary disease.   Electronically Signed   By: Sandi Mariscal M.D.   On: 12/20/2013 13:16   Dg Chest 2 View  12/13/2013   CLINICAL DATA:  Shortness of breath.  Hypertension.  EXAM: CHEST  2 VIEW  COMPARISON:  12/07/2013  FINDINGS: Shallow inspiration. Postoperative change in the mediastinum. Cardiac enlargement with pulmonary vascular congestion. Mild hazy interstitial pattern suggest mild edema. No airspace disease or consolidation. No blunting of costophrenic angles. No pneumothorax.  IMPRESSION: Cardiac enlargement with pulmonary vascular congestion and mild interstitial edema. Similar to previous study.   Electronically Signed   By: Lucienne Capers M.D.   On: 12/13/2013 22:51   Nm Myocar Multi W/spect W/wall Motion / Ef  11/29/2013   CLINICAL DATA:  Chest Pain  EXAM: MYOCARDIAL IMAGING WITH SPECT (REST AND PHARMACOLOGIC-STRESS)  GATED LEFT VENTRICULAR WALL MOTION STUDY  LEFT  VENTRICULAR EJECTION FRACTION  TECHNIQUE: Standard myocardial SPECT imaging was performed after resting intravenous injection of 10 mCi Tc-55m sestamibi. Subsequently, intravenous infusion of Lexiscan was performed under the supervision of the Cardiology staff. At peak effect of the drug, 30 mCi Tc-44m sestamibi was injected intravenously and standard myocardial SPECT imaging was performed. Quantitative gated imaging was also performed to evaluate left ventricular wall motion, and estimate left ventricular ejection fraction.  COMPARISON:  03/12/2013  FINDINGS: Perfusion: There is a large area of markedly decreased activity in the inferior wall of the left ventricular myocardium. No reversible component.  Wall Motion: Left ventricular dilatation. Decreased inferior wall motion and thickening.  Left Ventricular Ejection Fraction: 35 %  End diastolic volume 580 ml  End systolic volume 998 ml  IMPRESSION: 1. No reversible ischemia.  Fixed large inferior wall defect.  2. Left ventricular dilatation with decreased inferior wall motion.  3. Left ventricular ejection fraction 35%  4. High -risk stress test findings*.  *2012 Appropriate Use Criteria for Coronary Revascularization Focused Update: J Am Coll Cardiol. 3382;50(5):397-673. http://content.airportbarriers.com.aspx?articleid=1201161   Electronically Signed   By: Arne Cleveland M.D.   On: 11/29/2013 15:22   Dg Chest Port 1 View  12/07/2013   CLINICAL DATA:  Lethargy. Fatigue, shortness of breath and chest pain.  EXAM: PORTABLE CHEST - 1 VIEW  COMPARISON:  Chest radiograph performed 11/26/2013  FINDINGS: The lungs are well-aerated. Persistent vascular congestion is noted. Mildly increased interstitial markings may reflect mild interstitial edema. There is no evidence of pleural effusion or pneumothorax.  The cardiomediastinal silhouette is mildly enlarged. The patient is status post median sternotomy, with evidence of prior CABG. No acute osseous abnormalities  are seen.  IMPRESSION: Vascular congestion and mild cardiomegaly. Mildly increased interstitial markings again noted, raising concern for mild persistent interstitial edema.   Electronically Signed   By: Garald Balding M.D.   On: 12/07/2013 05:55   Dg Chest Portable 1 View  11/26/2013   CLINICAL DATA:  Chest pain  EXAM: PORTABLE CHEST - 1 VIEW  COMPARISON:  10/23/2013  FINDINGS: Postoperative changes in the mediastinum. Shallow inspiration. Heart size is enlarged and there is pulmonary vascular congestion centrally. Suggestion of mild interstitial edema  in the lung bases. No focal consolidation. No pneumothorax. No blunting of costophrenic angles.  IMPRESSION: Cardiac enlargement with central pulmonary vascular congestion and mild interstitial edema.   Electronically Signed   By: Lucienne Capers M.D.   On: 11/26/2013 04:28    Admission HPI:   Charles [name] is 68 yo man with HFpEF, CAD (s/p 3v CABG in 2009), anemia (h/o antral AVM and GI bleed, ablated in 07/2013), CKD stage II, DMII and cocaine abuse who is admitted for chest pain, dyspnea, and dizziness. Patient reports that the chest pain started this morning around 4 AM, it is a burning sensation, and is spread all over both sides of his chest and abdomen. Patient also reports waking up coughing up clear sputum along with having some dyspnea and chills with coughing. She also reports generalized bloating in his abdominal area with no focal pain. Patient denies any worsening of his chest pain or dyspnea with exertion. He says that the nitroglycerin that he has received in the emergency department has not helped with his pain at all. He does report that morphine, which he received at a recent hospitalization, makes the pain much better.   Patient was recently admitted between 12/13/2013 in 12/15/2013 for similar symptoms. A cardiac workup at that point was negative. Patient has also been admitted around 6 times in the last 8 months. He does report taking his  Lasix at home, though only 40 mg instead of the 80 mg daily prescribed at discharge. Patient reports that his most recent cocaine use was 5 days ago.   Patient otherwise denies any fevers, constipation, diarrhea, nausea, or vomiting.  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 Myoview no ischemia 11/27/13   Chronic kidney disease (CKD), stage III (moderate)   Polysubstance abuse   Cocaine abuse   Anemia, iron deficiency   Chest pain   Acute on chronic systolic heart failure   Acute on chronic congestive heart failure:  Echo from 11/27/2013 showing a ejection fraction of 40-45% and grade 2 diastolic dysfunction. Upon admission, proBNP was down 5934 from 6456 on last admission. Chest x-ray was remarkable for cardiomegaly and pulmonary congestion but no major edema. However patient's dry weight seemed to be around 160 pounds according to record, admitted with a weight of 175 pounds. Physical exam findings were also compelling for pulmonary edema. Patient states that he has not been taking the full dosage of Lasix, 80 mg, that he was prescribed at discharge from his last hospitalization. Patient received Lasix 40 mg IV twice a day with a total of -1.9 m that over a span of 3 days. Output may be greater since patient was not always compliant with urinating in a urinal. However, weights have been stable at 176 pounds throughout hospitalization, though reliability of recordings questioned. Patient to have close followup with primary care physician, [name], for further management, on 12/23/2013.  Iron Deficiency Anemia: Admission hemoglobin 7.8. Workup from recent admission consistent with iron deficiency anemia. 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 During the last admission, GI assessed the patient in recommended continued iron  supplementation and PPI twice a day. Patient was continued on home iron supplementation with stable hemoglobins.  Hypertension: Patient admitted with markedly elevated blood pressures, roughly 200/100. Patient has been compliant with his blood pressure medications, though his blood pressures  may have been exacerbated by cocaine use. Throughout his admission, his blood pressures remained between 120 in the 867E systolic on his home regimen.  Coronary artery disease (status post 3 vessel CABG in 2009): Patient's reported chest pain was non-exertional and not responsive to nitroglycerin. No concerning changes on EKG and troponins were negative x3. Unlikely that his chest pain was cardiac in etiology. Continued home aspirin 81 mg.  Cocaine Abuse: Patient stated that he had last used cocaine 5 days before admission. Upon admission, urine drug screen was positive for cocaine, barbiturates, and opiates. His blood pressure upon admission was 200/100, and remained between 720 and 947S systolic on his home regimen of blood pressure medications.  CKD Stage II: Patient remained at baseline creatinine throughout admission.  Discharge Vitals:   BP 140/74  Pulse 62  Temp(Src) 97.8 F (36.6 C) (Oral)  Resp 25  Ht 5\' 5"  (1.651 m)  Wt 177 lb (80.287 kg)  BMI 29.45 kg/m2  SpO2 99%  Discharge Labs:  Results for orders placed during the hospital encounter of 12/20/13 (from the past 24 hour(s))  TROPONIN I     Status: None   Collection Time    12/20/13  6:03 PM      Result Value Ref Range   Troponin I <0.30  <0.30 ng/mL  URINE RAPID DRUG SCREEN (HOSP PERFORMED)     Status: Abnormal   Collection Time    12/20/13 10:15 PM      Result Value Ref Range   Opiates POSITIVE (*) NONE DETECTED   Cocaine POSITIVE (*) NONE DETECTED   Benzodiazepines NONE DETECTED  NONE DETECTED   Amphetamines NONE DETECTED  NONE DETECTED   Tetrahydrocannabinol NONE DETECTED  NONE DETECTED   Barbiturates POSITIVE (*) NONE DETECTED   MRSA PCR SCREENING     Status: Abnormal   Collection Time    12/20/13 11:29 PM      Result Value Ref Range   MRSA by PCR POSITIVE (*) NEGATIVE  TROPONIN I     Status: None   Collection Time    12/20/13 11:35 PM      Result Value Ref Range   Troponin I <0.30  <0.30 ng/mL  TROPONIN I     Status: None   Collection Time    12/21/13  2:40 AM      Result Value Ref Range   Troponin I <0.30  <0.30 ng/mL  BASIC METABOLIC PANEL     Status: Abnormal   Collection Time    12/21/13  2:40 AM      Result Value Ref Range   Sodium 138  137 - 147 mEq/L   Potassium 4.0  3.7 - 5.3 mEq/L   Chloride 103  96 - 112 mEq/L   CO2 21  19 - 32 mEq/L   Glucose, Bld 115 (*) 70 - 99 mg/dL   BUN 19  6 - 23 mg/dL   Creatinine, Ser 1.07  0.50 - 1.35 mg/dL   Calcium 8.7  8.4 - 10.5 mg/dL   GFR calc non Af Amer 69 (*) >90 mL/min   GFR calc Af Amer 80 (*) >90 mL/min   Anion gap 14  5 - 15    Signed: Luan Moore, MD 12/21/2013, 3:36 PM    Services Ordered on Discharge: Bus passes for transportation Equipment Ordered on Discharge: None

## 2013-12-21 NOTE — Progress Notes (Signed)
Subjective:  Charles [name] reports that his chest pain is improved after the morphine. He does report that he has not had as much urine output as he would've liked from the Lasix. Patient still reporting a persistent cough productive of clear sputum.   Objective: Vital signs in last 24 hours: Filed Vitals:   12/21/13 0900 12/21/13 1000 12/21/13 1202 12/21/13 1300  BP: 147/61 143/71 159/85 140/74  Pulse: 82 66 65 62  Temp:   97.8 F (36.6 C)   TempSrc:   Oral   Resp: 33 17 32 25  Height:      Weight:      SpO2: 95% 93% 99% 99%   Weight change:   Intake/Output Summary (Last 24 hours) at 12/21/13 1356 Last data filed at 12/21/13 1329  Gross per 24 hour  Intake    480 ml  Output   1301 ml  Net   -821 ml   General: resting in bed  HEENT: PERRL, EOMI, no scleral icterus  Cardiac: RRR, no rubs, murmurs or gallops  Pulm: bilateral basilar rales  Abd: soft, nontender, nondistended, BS present  Ext: warm and well perfused, no edema bilateral lower extremities  Neuro: alert and oriented X3, cranial nerves II-XII grossly intact  Skin: no rashes or lesions noted  Psych: appropriate affect and cognition  Lab Results: Basic Metabolic Panel:  Recent Labs Lab 12/20/13 1032 12/21/13 0240  NA 131* 138  K 4.3 4.0  CL 99 103  CO2 18* 21  GLUCOSE 93 115*  BUN 20 19  CREATININE 1.05 1.07  CALCIUM 8.9 8.7   Liver Function Tests: No results found for this basename: AST, ALT, ALKPHOS, BILITOT, PROT, ALBUMIN,  in the last 168 hours No results found for this basename: LIPASE, AMYLASE,  in the last 168 hours No results found for this basename: AMMONIA,  in the last 168 hours CBC:  Recent Labs Lab 12/15/13 0343 12/20/13 1032  WBC 5.2 5.8  HGB 7.6* 7.8*  HCT 24.3* 26.0*  MCV 77.1* 77.6*  PLT 375 348   Cardiac Enzymes:  Recent Labs Lab 12/20/13 1803 12/20/13 2335 12/21/13 0240  TROPONINI <0.30 <0.30 <0.30   BNP:  Recent Labs Lab 12/20/13 1032  PROBNP 5934.0*    D-Dimer: No results found for this basename: DDIMER,  in the last 168 hours CBG: No results found for this basename: GLUCAP,  in the last 168 hours Hemoglobin A1C: No results found for this basename: HGBA1C,  in the last 168 hours Fasting Lipid Panel: No results found for this basename: CHOL, HDL, LDLCALC, TRIG, CHOLHDL, LDLDIRECT,  in the last 168 hours Thyroid Function Tests: No results found for this basename: TSH, T4TOTAL, FREET4, T3FREE, THYROIDAB,  in the last 168 hours Coagulation: No results found for this basename: LABPROT, INR,  in the last 168 hours Anemia Panel: No results found for this basename: VITAMINB12, FOLATE, FERRITIN, TIBC, IRON, RETICCTPCT,  in the last 168 hours Urine Drug Screen: Drugs of Abuse     Component Value Date/Time   LABOPIA POSITIVE* 12/20/2013 2215   COCAINSCRNUR POSITIVE* 12/20/2013 2215   LABBENZ NONE DETECTED 12/20/2013 2215   AMPHETMU NONE DETECTED 12/20/2013 2215   THCU NONE DETECTED 12/20/2013 2215   LABBARB POSITIVE* 12/20/2013 2215    Alcohol Level: No results found for this basename: ETH,  in the last 168 hours Urinalysis: No results found for this basename: COLORURINE, APPERANCEUR, LABSPEC, PHURINE, GLUCOSEU, HGBUR, BILIRUBINUR, KETONESUR, PROTEINUR, UROBILINOGEN, NITRITE, LEUKOCYTESUR,  in the last 168  hours  Micro Results: Recent Results (from the past 240 hour(s))  MRSA PCR SCREENING     Status: Abnormal   Collection Time    12/20/13 11:29 PM      Result Value Ref Range Status   MRSA by PCR POSITIVE (*) NEGATIVE Final   Comment:            The GeneXpert MRSA Assay (FDA     approved for NASAL specimens     only), is one component of a     comprehensive MRSA colonization     surveillance program. It is not     intended to diagnose MRSA     infection nor to guide or     monitor treatment for     MRSA infections.     RESULT CALLED TO, READ BACK BY AND VERIFIED WITH:     STOPHEL,A RN 0221 12/21/13 MITCHELL,L    Studies/Results: Dg Chest 2 View  12/20/2013   CLINICAL DATA:  Bilateral chest pain, dizziness and nausea since 4 a.m. today. Chills for the past hr. Initial encounter.  EXAM: CHEST  2 VIEW  COMPARISON:  12/13/2013; 10/23/2013; chest CT -10/23/2013  FINDINGS: Grossly unchanged enlarged cardiac silhouette and mediastinal contours post median sternotomy and CABG. Evaluation the retrosternal clear space obscured secondary to overlying soft tissues. Chronic pulmonary venous congestion without frank evidence of edema. There is persistent pleural parenchymal thickening about the peripheral aspect the right minor fissure. Grossly unchanged bibasilar heterogeneous opacities favored to represent atelectasis. No new focal airspace opacities. No pleural effusion or pneumothorax. Unchanged bones. Surgical clips overlie the left lung apex.  IMPRESSION: Cardiomegaly and pulmonary venous congestion without acute cardiopulmonary disease.   Electronically Signed   By: Sandi Mariscal M.D.   On: 12/20/2013 13:16   Medications: I have reviewed the patient's current medications. Scheduled Meds: . amLODipine  10 mg Oral Daily  . aspirin EC  81 mg Oral Daily  . atorvastatin  20 mg Oral q1800  . Chlorhexidine Gluconate Cloth  6 each Topical Q0600  . citalopram  20 mg Oral Daily  . cloNIDine  0.2 mg Oral BID  . docusate sodium  100 mg Oral BID  . ferrous sulfate  325 mg Oral Daily  . gabapentin  100 mg Oral BID  . heparin  5,000 Units Subcutaneous 3 times per day  . hydrALAZINE  50 mg Oral TID  . isosorbide mononitrate  30 mg Oral Daily  . mupirocin ointment  1 application Nasal BID  . pantoprazole  40 mg Oral BID  . phenytoin  200 mg Oral Daily  . phenytoin  300 mg Oral QHS  . sodium chloride  3 mL Intravenous Q12H  . sodium chloride  3 mL Intravenous Q12H   Continuous Infusions:  PRN Meds:.morphine injection, nitroGLYCERIN Assessment/Plan: Principal Problem:   Acute on chronic combined systolic and diastolic  heart failure Active Problems:   Hypertension   CAD- CABG x3 '09. Myoview no ischemia 11/27/13   Chronic kidney disease (CKD), stage III (moderate)   Polysubstance abuse   Cocaine abuse   Anemia, iron deficiency   Chest pain   Acute on chronic systolic heart failure  Charles [name] is 68 yo man with HFpEF, CAD (s/p 3v CABG in 2009), anemia (h/o antral AVM and GI bleed, ablated in 07/2013), CKD stage II, DMII and cocaine abuse who is admitted for chest pain, dyspnea, and dizziness, secondary to acute on chronic congestive heart failure.   Acute on chronic congestive  heart failure: Echo from 11/27/2013 showing LVEF of 40-45% and grade 2 diastolic dysfunction. proBNP down to 5934 from 6456 last admission. Chest x-ray remarkable for cardiomegaly and pulmonary congestion but no major edema. However, patient's dry weight is seems to be around 160 lbs from May 2015, currently 177 lbs, and physical exam still compelling for pulmonary edema. Net output overnight only 0.5 L. No concerning EKG changes and troponin negative x 3.  - Increase to Lasix 80 mg IV today (from 40 yesterday) - Supplement potassium as needed  - Strict I.'s and O.'s  - Daily weights   Iron Deficiency Anemia: Admission hemoglobin 7.8. Workup from recent admission consistent with iron deficiency anemia. 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 During the last admission, GI assessed the patient in recommended continued iron supplementation and PPI twice a day.  - Continue home iron supplementation  Hypertension: Patient admitted with markedly elevated blood pressures, roughly 200/100. Patient has been doing better since admission, ranging in the 017C-944H systolic. His hypertension was likely exacerbated by his cocaine use.   - Continue home amlodipine 10 mg daily, clonidine 0.2 mg BID, hydralazine 50 mg TID  Coronary artery disease (status post 3 vessel CABG in 2009): Chest pain is nonexertional, not  responsive to nitroglycerin. EKG not showing evidence of ischemic changes. Troponins negative x3. Unlikely that current chest pain is cardiac in etiology, and the pain has resolved with morphine. - Cycle troponins  - continue home aspirin 81 mg   Cocaine Abuse: Patient states that he last used cocaine 5 days ago.  - Will counsel patient about cessation.  - Social work consult   CKD Stage II: Patient at baseline creatinine upon admission.  Diet: heart  Prophylaxis: SCDs and heparin  Code: Full  Dispo: Disposition is deferred at this time, awaiting improvement of current medical problems.  Anticipated discharge in approximately 2 day(s).   The patient does have a current PCP [name]) and does need an [facility] hospital follow-up appointment after discharge.  The patient does not have transportation limitations that hinder transportation to clinic appointments.  .Services Needed at time of discharge: Y = Yes, Blank = No PT:   OT:   RN:   Equipment:   Other:     LOS: 1 day   Luan Moore, MD 12/21/2013, 1:56 PM

## 2013-12-21 NOTE — Discharge Summary (Signed)
Name: Charles [name] MRN: [id number] DOB: Oct 24, 1945 68 y.o. PCP: Charles [name]  Date of Admission: 12/13/2013  8:50 PM Date of Discharge: 12/15/13 Attending Physician: Charles Dresser, MD   Discharge Diagnosis: Active Problems:   Chest pain  Discharge Medications:   Medication List         amLODipine 10 MG tablet  Commonly known as:  NORVASC  Take 10 mg by mouth daily.     aspirin EC 81 MG tablet  Take 81 mg by mouth daily.     citalopram 20 MG tablet  Commonly known as:  CELEXA  Take 20 mg by mouth daily.     cloNIDine 0.2 MG tablet  Commonly known as:  CATAPRES  Take 0.2 mg by mouth 2 (two) times daily.     docusate sodium 100 MG capsule  Commonly known as:  COLACE  Take 100 mg by mouth 2 (two) times daily.     ferrous sulfate 325 (65 FE) MG tablet  Take 325 mg by mouth daily.     furosemide 80 MG tablet  Commonly known as:  LASIX  Take 80 mg by mouth daily.     gabapentin 100 MG capsule  Commonly known as:  NEURONTIN  Take 100 mg by mouth 2 (two) times daily.     hydrALAZINE 50 MG tablet  Commonly known as:  APRESOLINE  Take 50 mg by mouth 3 (three) times daily.     isosorbide mononitrate 30 MG 24 hr tablet  Commonly known as:  IMDUR  Take 30 mg by mouth daily.     pantoprazole 40 MG tablet  Commonly known as:  PROTONIX  Take 40 mg by mouth 2 (two) times daily.     phenytoin 100 MG ER capsule  Commonly known as:  DILANTIN  Take 200-300 mg by mouth 2 (two) times daily. 200mg  in the morning, 300mg  in the evening     simvastatin 40 MG tablet  Commonly known as:  ZOCOR  Take 40 mg by mouth every evening.        Disposition and follow-up:   Charles [name] was discharged from [facility] in Stable condition.  At the hospital follow up visit please address:  1.  Charles [name] was found to be volume overloaded and anemic. Assessment by cardiology and GI was for no further workup. Both of his problems were  exacerbated by the fact that he had not taken his lasix or iron supplements at home, and had been using cocaine; please continue to counsel the patient on the importance of taking them.  2.  Labs / imaging needed at time of follow-up: CBC for hemoglobin  3.  Pending labs/ test needing follow-up: weight  Follow-up Appointments:     Follow-up Information   Schedule an appointment as soon as possible for a visit with [place]    . (Left a message )    Contact information:   [place] [id number] [phone]      Discharge Instructions:  It is very important that you take your lasix and your iron supplements at home.   Furosemide tablets What is this medicine? FUROSEMIDE (fyoor [place] se mide) is a diuretic. It helps you make more urine and to lose salt and excess water from your body. This medicine is used to treat high blood pressure, and edema or swelling from heart, kidney, or liver disease. This medicine may be used for other  purposes; ask your health care provider or pharmacist if you have questions. COMMON BRAND NAME(S): Delone, Lasix What should I tell my health care provider before I take this medicine? They need to know if you have any of these conditions: -abnormal blood electrolytes -diarrhea or vomiting -gout -heart disease -kidney disease, small amounts of urine, or difficulty passing urine -liver disease -an unusual or allergic reaction to furosemide, sulfa drugs, other medicines, foods, dyes, or preservatives -pregnant or trying to get pregnant -breast-feeding How should I use this medicine? Take this medicine by mouth with a glass of water. Follow the directions on the prescription label. You may take this medicine with or without food. If it upsets your stomach, take it with food or milk. Do not take your medicine more often than directed. Remember that you will need to pass more urine after taking this medicine.  Do not take your medicine at a time of day that will cause you problems. Do not take at bedtime. Talk to your pediatrician regarding the use of this medicine in children. While this drug may be prescribed for selected conditions, precautions do apply. Overdosage: If you think you have taken too much of this medicine contact a poison control center or emergency room at once. NOTE: This medicine is only for you. Do not share this medicine with others. What if I miss a dose? If you miss a dose, take it as soon as you can. If it is almost time for your next dose, take only that dose. Do not take double or extra doses. What may interact with this medicine? -aspirin and aspirin-like medicines -certain antibiotics -chloral hydrate -cisplatin -cyclosporine -digoxin -diuretics -laxatives -lithium -medicines for blood pressure -medicines that relax muscles for surgery -methotrexate -NSAIDs, medicines for pain and inflammation like ibuprofen, naproxen, or indomethacin -phenytoin -steroid medicines like prednisone or cortisone -sucralfate This list may not describe all possible interactions. Give your health care provider a list of all the medicines, herbs, non-prescription drugs, or dietary supplements you use. Also tell them if you smoke, drink alcohol, or use illegal drugs. Some items may interact with your medicine. What should I watch for while using this medicine? Visit your doctor or health care professional for regular checks on your progress. Check your blood pressure regularly. Ask your doctor or health care professional what your blood pressure should be, and when you should contact him or her. If you are a diabetic, check your blood sugar as directed. You may need to be on a special diet while taking this medicine. Check with your doctor. Also, ask how many glasses of fluid you need to drink a day. You must not get dehydrated. You may get drowsy or dizzy. Do not drive, use machinery, or do  anything that needs mental alertness until you know how this drug affects you. Do not stand or sit up quickly, especially if you are an older patient. This reduces the risk of dizzy or fainting spells. Alcohol can make you more drowsy and dizzy. Avoid alcoholic drinks. This medicine can make you more sensitive to the sun. Keep out of the sun. If you cannot avoid being in the sun, wear protective clothing and use sunscreen. Do not use sun lamps or tanning beds/booths. What side effects may I notice from receiving this medicine? Side effects that you should report to your doctor or health care professional as soon as possible: -blood in urine or stools -dry mouth -fever or chills -hearing loss or ringing in the ears -  irregular heartbeat -muscle pain or weakness, cramps -skin rash -stomach upset, pain, or nausea -tingling or numbness in the hands or feet -unusually weak or tired -vomiting or diarrhea -yellowing of the eyes or skin Side effects that usually do not require medical attention (report to your doctor or health care professional if they continue or are bothersome): -headache -loss of appetite -unusual bleeding or bruising This list may not describe all possible side effects. Call your doctor for medical advice about side effects. You may report side effects to FDA at 1-800-FDA-1088. Where should I keep my medicine? Keep out of the reach of children. Store at room temperature between 15 and 30 degrees C (59 and 86 degrees F). Protect from light. Throw away any unused medicine after the expiration date. NOTE: This sheet is a summary. It may not cover all possible information. If you have questions about this medicine, talk to your doctor, pharmacist, or health care provider.  2015, Elsevier/Gold Standard. (2009-02-22 16:24:50)   Consultations:    Procedures Performed:  Dg Chest 2 View  12/20/2013   CLINICAL DATA:  Bilateral chest pain, dizziness and nausea since 4 a.m. today.  Chills for the past hr. Initial encounter.  EXAM: CHEST  2 VIEW  COMPARISON:  12/13/2013; 10/23/2013; chest CT -10/23/2013  FINDINGS: Grossly unchanged enlarged cardiac silhouette and mediastinal contours post median sternotomy and CABG. Evaluation the retrosternal clear space obscured secondary to overlying soft tissues. Chronic pulmonary venous congestion without frank evidence of edema. There is persistent pleural parenchymal thickening about the peripheral aspect the right minor fissure. Grossly unchanged bibasilar heterogeneous opacities favored to represent atelectasis. No new focal airspace opacities. No pleural effusion or pneumothorax. Unchanged bones. Surgical clips overlie the left lung apex.  IMPRESSION: Cardiomegaly and pulmonary venous congestion without acute cardiopulmonary disease.   Electronically Signed   By: Sandi Mariscal M.D.   On: 12/20/2013 13:16   Dg Chest 2 View  12/13/2013   CLINICAL DATA:  Shortness of breath.  Hypertension.  EXAM: CHEST  2 VIEW  COMPARISON:  12/07/2013  FINDINGS: Shallow inspiration. Postoperative change in the mediastinum. Cardiac enlargement with pulmonary vascular congestion. Mild hazy interstitial pattern suggest mild edema. No airspace disease or consolidation. No blunting of costophrenic angles. No pneumothorax.  IMPRESSION: Cardiac enlargement with pulmonary vascular congestion and mild interstitial edema. Similar to previous study.   Electronically Signed   By: Lucienne Capers M.D.   On: 12/13/2013 22:51   Nm Myocar Multi W/spect W/wall Motion / Ef  11/29/2013   CLINICAL DATA:  Chest Pain  EXAM: MYOCARDIAL IMAGING WITH SPECT (REST AND PHARMACOLOGIC-STRESS)  GATED LEFT VENTRICULAR WALL MOTION STUDY  LEFT VENTRICULAR EJECTION FRACTION  TECHNIQUE: Standard myocardial SPECT imaging was performed after resting intravenous injection of 10 mCi Tc-66m sestamibi. Subsequently, intravenous infusion of Lexiscan was performed under the supervision of the Cardiology  staff. At peak effect of the drug, 30 mCi Tc-47m sestamibi was injected intravenously and standard myocardial SPECT imaging was performed. Quantitative gated imaging was also performed to evaluate left ventricular wall motion, and estimate left ventricular ejection fraction.  COMPARISON:  03/12/2013  FINDINGS: Perfusion: There is a large area of markedly decreased activity in the inferior wall of the left ventricular myocardium. No reversible component.  Wall Motion: Left ventricular dilatation. Decreased inferior wall motion and thickening.  Left Ventricular Ejection Fraction: 35 %  End diastolic volume 449 ml  End systolic volume 675 ml  IMPRESSION: 1. No reversible ischemia.  Fixed large inferior wall defect.  2. Left ventricular dilatation with decreased inferior wall motion.  3. Left ventricular ejection fraction 35%  4. High -risk stress test findings*.  *2012 Appropriate Use Criteria for Coronary Revascularization Focused Update: J Am Coll Cardiol. 2751;70(0):174-944. http://content.airportbarriers.com.aspx?articleid=1201161   Electronically Signed   By: Arne Cleveland M.D.   On: 11/29/2013 15:22   Dg Chest Port 1 View  12/07/2013   CLINICAL DATA:  Lethargy. Fatigue, shortness of breath and chest pain.  EXAM: PORTABLE CHEST - 1 VIEW  COMPARISON:  Chest radiograph performed 11/26/2013  FINDINGS: The lungs are well-aerated. Persistent vascular congestion is noted. Mildly increased interstitial markings may reflect mild interstitial edema. There is no evidence of pleural effusion or pneumothorax.  The cardiomediastinal silhouette is mildly enlarged. The patient is status post median sternotomy, with evidence of prior CABG. No acute osseous abnormalities are seen.  IMPRESSION: Vascular congestion and mild cardiomegaly. Mildly increased interstitial markings again noted, raising concern for mild persistent interstitial edema.   Electronically Signed   By: Garald Balding M.D.   On: 12/07/2013 05:55   Dg  Chest Portable 1 View  11/26/2013   CLINICAL DATA:  Chest pain  EXAM: PORTABLE CHEST - 1 VIEW  COMPARISON:  10/23/2013  FINDINGS: Postoperative changes in the mediastinum. Shallow inspiration. Heart size is enlarged and there is pulmonary vascular congestion centrally. Suggestion of mild interstitial edema in the lung bases. No focal consolidation. No pneumothorax. No blunting of costophrenic angles.  IMPRESSION: Cardiac enlargement with central pulmonary vascular congestion and mild interstitial edema.   Electronically Signed   By: Lucienne Capers M.D.   On: 11/26/2013 04:28   Admission HPI:  68 year old man with a history of HFpEF, CAD s/p 3VCABG 2009, anemia with h/o GI bleed, and cocaine abuse who presents with chest pain. He was last admitted for the same above reasons from 9/20-21. Pt stopped taking his home medications 3 days ago b/c he did not feel well and last used cocaine 4 days ago. Today around 12:00pm he started coughing with sputum production that was white w/ streaking. The coughing caused him to be SOB. He also starting experiencing pain from his neck down to his abdomen which he describes as a sore feeling. He also has rt sided chest pain that is sharp in nature and centered around his sternal area. He rates chest pain as 8/10 and constant. He also states that his chest burns when he breaths. He states that this chest pain is different from his prior episodes due to the pain that starts at his neck and occurs on both sides of his chest. He also reports he starts coughing when he has fluid build up. Pt reports generalized abdominal pain that is dull in nature and he rates it as 10/10 in severity. He has noticed swelling in his abdomen and legs. He reports fatigue and weakness. Denies fevers, changes in appetite, blood in stools, change in odor of stools, HA, and pressure behind eyes.   Admission Physical Exam:  Blood pressure 174/96, pulse 94, temperature 97.8 F (36.6 C), temperature source  Oral, resp. rate 20, height 5' 4.96" (1.65 m), weight 173 lb 1.6 oz (78.518 kg), SpO2 96.00%.  BP 174/96  Pulse 94  Temp(Src) 97.8 F (36.6 C) (Oral)  Resp 20  Ht 5' 4.96" (1.65 m)  Wt 173 lb 1.6 oz (78.518 kg)  BMI 28.84 kg/m2  SpO2 96%  General Appearance:  Alert, cooperative, no distress   Eyes:  PERRL, conjunctiva/corneas clear   Throat:  Lips, mucosa, and tongue normal; teeth and gums normal   Neck:  no carotid bruit or JVD   Lungs:  Lower rt lobe crackles   Chest wall:  Tender to palpation of chest, midline sternum scar   Heart:  Regular rate and rhythm, S1 and S2 normal, no murmur, rub or gallop   Abdomen:  Soft, tender to palpation of all 4 quadrants, active bowel sounds   Extremities:  No pedal edema   Neurologic:  CNII-XII grossly intact.      Hospital Course by problem list: Active Problems:   Chest pain   This 68 yo man with HFpEF, CAD (s/p 3v CABG in 2009), anemia (h/o antral AVM and GI bleed, ablated in 07/2013), CKD, DMII and cocaine abuse was here with abdominal distension, vague abdominal/chest pain and anemia, all in the setting of medication noncompliance and active cocaine use. His EKG and CXR were unchanged from discharge of his prior hospitalization last week but his BNP was elevated and his hemoglobin had dropped. He had + orthostatics and was FOBT+. He has had extensive cardiac workups in this hospital but continues to reappear with chest pain; he was initially interested in finding a better living situation (he currently lives with his daughter in an environment that is "not conducive to him taking his lasix or quitting cocaine"), however, he refused help from social work at the end of his visit, stating that [place] "takes all your money". He would like to move to [place] and to live in his own room, but cannot afford rent. He does not qualify for SNF.    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 EGD 07/2013 revealed an antral AVM  that was ablated. Iron studies previously indicated iron deficiency anemia with no obvious chronic bleeding source. He is on home iron supplementation; unclear whether he has been taking it. On admission, Hb 6.4 down from 7.3 on last admission. Was transfused 1 unit to Hb 7.8-->7.6. GI recommended continued iron supplementation (patient has not been taking it regularly) and PPI BID; he was started on these. GI assessment was that he had no overt bleed, so no indication for repeat scopes at this time   Iron Deficiency Anemia: Baseline hemoglobin between 7-9. Recent anemia panel revealed iron level of 16, ferritin of 34, with a TIBC of 382. Folate level was normal. He was sent home on iron supplementation and told the importance of compliance with this.  Acute on Chronic Diastolic Heart Failure: echo on 11/27/13 showed reduced EF 40-45% (down from 50-55% with grade 2 diastolic dysfunction in 06/2681) with EF 35% on lexiscan 11/29/13. Patient is noncompliant with his lasix (80mg  daily at home); last took it on 9/24. ProBNP on last admission >5000-->1908; now back to 6456.  Pain from neck to pelvis: Negative cardiac workup last week, i-stat troponin negative, repeat EKG unchanged. Troponins negative x3. Pain subsided while hospitalized.   Hypertension: less elevated on this admission (130s/70s); his hypertension is likely generally a result of medication noncompliance and cocaine use. Home medications are amlodipine 10 mg daily, clonidine 0.2 mg twice daily, hydralazine 50mg  TID and lasix 80 mg daily. Last took these on 9/24.   Cocaine Abuse: counseled patient on cessation; patient states that his home environment is not conducive to quitting. However, was not interested in speaking with SW.  Discharge Vitals:   BP 127/71  Pulse 66  Temp(Src) 97.6 F (36.4 C) (Oral)  Resp 18  Ht  5' 4.96" (1.65 m)  Wt 175 lb 0.7 oz (79.4 kg)  BMI 29.16 kg/m2  SpO2 97%  Signed: Drucilla Schmidt, MD 12/21/2013, 10:53 AM      Services Ordered on Discharge: [place] RN, PT, SW, disease management Equipment Ordered on Discharge: none

## 2013-12-21 NOTE — Progress Notes (Signed)
Utilization Review Completed.   Mixtli Reno, RN, BSN Nurse Case Manager  

## 2013-12-22 DIAGNOSIS — R079 Chest pain, unspecified: Secondary | ICD-10-CM | POA: Diagnosis not present

## 2013-12-22 DIAGNOSIS — F1419 Cocaine abuse with unspecified cocaine-induced disorder: Secondary | ICD-10-CM

## 2013-12-22 DIAGNOSIS — N182 Chronic kidney disease, stage 2 (mild): Secondary | ICD-10-CM

## 2013-12-22 DIAGNOSIS — D509 Iron deficiency anemia, unspecified: Secondary | ICD-10-CM

## 2013-12-22 DIAGNOSIS — I5043 Acute on chronic combined systolic (congestive) and diastolic (congestive) heart failure: Secondary | ICD-10-CM

## 2013-12-22 DIAGNOSIS — I25810 Atherosclerosis of coronary artery bypass graft(s) without angina pectoris: Secondary | ICD-10-CM

## 2013-12-22 DIAGNOSIS — I129 Hypertensive chronic kidney disease with stage 1 through stage 4 chronic kidney disease, or unspecified chronic kidney disease: Secondary | ICD-10-CM

## 2013-12-22 LAB — BASIC METABOLIC PANEL
Anion gap: 14 (ref 5–15)
BUN: 18 mg/dL (ref 6–23)
CHLORIDE: 99 meq/L (ref 96–112)
CO2: 22 meq/L (ref 19–32)
Calcium: 8.6 mg/dL (ref 8.4–10.5)
Creatinine, Ser: 1.19 mg/dL (ref 0.50–1.35)
GFR calc Af Amer: 70 mL/min — ABNORMAL LOW (ref >90)
GFR calc non Af Amer: 61 mL/min — ABNORMAL LOW (ref >90)
GLUCOSE: 76 mg/dL (ref 70–99)
POTASSIUM: 3.9 meq/L (ref 3.7–5.3)
SODIUM: 135 meq/L — AB (ref 137–147)

## 2013-12-22 LAB — CBC
HCT: 24.3 % — ABNORMAL LOW (ref 39.0–52.0)
HEMOGLOBIN: 7.3 g/dL — AB (ref 13.0–17.0)
MCH: 23.2 pg — ABNORMAL LOW (ref 26.0–34.0)
MCHC: 30 g/dL (ref 30.0–36.0)
MCV: 77.1 fL — ABNORMAL LOW (ref 78.0–100.0)
Platelets: 338 10*3/uL (ref 150–400)
RBC: 3.15 MIL/uL — AB (ref 4.22–5.81)
RDW: 18.3 % — ABNORMAL HIGH (ref 11.5–15.5)
WBC: 5.2 10*3/uL (ref 4.0–10.5)

## 2013-12-22 MED ORDER — FUROSEMIDE 80 MG PO TABS
80.0000 mg | ORAL_TABLET | Freq: Every day | ORAL | Status: DC
  Administered 2013-12-22: 80 mg via ORAL
  Filled 2013-12-22: qty 1

## 2013-12-22 NOTE — Progress Notes (Signed)
Patient ID: Charles [name], male   DOB: May 02, 1945, 68 y.o.   MRN: [id number]        Attending progress note    Date of Admission:  12/20/2013     Principal Problem:   Acute on chronic combined systolic and diastolic heart failure Active Problems:   Hypertension   CAD- CABG x3 '09. Myoview no ischemia 11/27/13   Chronic kidney disease (CKD), stage III (moderate)   Polysubstance abuse   Anemia, iron deficiency   H/O medication noncompliance   Chest pain   . amLODipine  10 mg Oral Daily  . aspirin EC  81 mg Oral Daily  . atorvastatin  20 mg Oral q1800  . Chlorhexidine Gluconate Cloth  6 each Topical Q0600  . citalopram  20 mg Oral Daily  . cloNIDine  0.2 mg Oral BID  . docusate sodium  100 mg Oral BID  . ferrous sulfate  325 mg Oral Daily  . furosemide  80 mg Oral Daily  . gabapentin  100 mg Oral BID  . heparin  5,000 Units Subcutaneous 3 times per day  . hydrALAZINE  50 mg Oral TID  . isosorbide mononitrate  30 mg Oral Daily  . mupirocin ointment  1 application Nasal BID  . pantoprazole  40 mg Oral BID  . phenytoin  200 mg Oral Daily  . phenytoin  300 mg Oral QHS  . sodium chloride  3 mL Intravenous Q12H  . sodium chloride  3 mL Intravenous Q12H    [name] is ready for discharge. Unfortunately his PCP, Dr. Alroy Dust, does not belong to [name] and therefore [name] is not eligible for [facility] care management services. He says that he will be able to buy a scale to weigh himself and he knows that his ideal weight is around 166. We will make sure that [name] received is a complete list of his current medications and doses so they can deliver new meds to him and remove the old medications. He has an appointment with Dr. Alroy Dust tomorrow.  Michel Bickers, MD [facility] for [facility] [phone] pager   [phone] cell 12/22/2013, 1:13 PM

## 2013-12-22 NOTE — Progress Notes (Signed)
Spoke to SW about housing. They have given him information last week and have spoken with him on several occassions. Social work has given needed information to the patient.

## 2013-12-22 NOTE — Progress Notes (Signed)
Pt very eager to be discharged. Pt has discharge papers, educated pt on medications, and Present illness. Pt agrees to follow up at appointment tomorrow. Pt has bus pass, and instructed to call with any questions. Pt belongings with pt. Etta Quill, RN

## 2013-12-22 NOTE — Discharge Instructions (Signed)
It is very important that you take your lasix - this medication can help control your heart failure and keep you out of the hospital!   Using cocaine can makes your symptoms worse, and ultimately could cause major damage to your heart.   Heart Failure Heart failure is a condition in which the heart has trouble pumping blood. This means your heart does not pump blood efficiently for your body to work well. In some cases of heart failure, fluid may back up into your lungs or you may have swelling (edema) in your lower legs. Heart failure is usually a long-term (chronic) condition. It is important for you to take good care of yourself and follow your health care provider's treatment plan. CAUSES  Some health conditions can cause heart failure. Those health conditions include:  High blood pressure (hypertension). Hypertension causes the heart muscle to work harder than normal. When pressure in the blood vessels is high, the heart needs to pump (contract) with more force in order to circulate blood throughout the body. High blood pressure eventually causes the heart to become stiff and weak.  Coronary artery disease (CAD). CAD is the buildup of cholesterol and fat (plaque) in the arteries of the heart. The blockage in the arteries deprives the heart muscle of oxygen and blood. This can cause chest pain and may lead to a heart attack. High blood pressure can also contribute to CAD.  Heart attack (myocardial infarction). A heart attack occurs when one or more arteries in the heart become blocked. The loss of oxygen damages the muscle tissue of the heart. When this happens, part of the heart muscle dies. The injured tissue does not contract as well and weakens the heart's ability to pump blood.  Abnormal heart valves. When the heart valves do not open and close properly, it can cause heart failure. This makes the heart muscle pump harder to keep the blood flowing.  Heart muscle disease (cardiomyopathy or  myocarditis). Heart muscle disease is damage to the heart muscle from a variety of causes. These can include drug or alcohol abuse, infections, or unknown reasons. These can increase the risk of heart failure.  Lung disease. Lung disease makes the heart work harder because the lungs do not work properly. This can cause a strain on the heart, leading it to fail.  Diabetes. Diabetes increases the risk of heart failure. High blood sugar contributes to high fat (lipid) levels in the blood. Diabetes can also cause slow damage to tiny blood vessels that carry important nutrients to the heart muscle. When the heart does not get enough oxygen and food, it can cause the heart to become weak and stiff. This leads to a heart that does not contract efficiently.  Other conditions can contribute to heart failure. These include abnormal heart rhythms, thyroid problems, and low blood counts (anemia). Certain unhealthy behaviors can increase the risk of heart failure, including:  Being overweight.  Smoking or chewing tobacco.  Eating foods high in fat and cholesterol.  Abusing illicit drugs or alcohol.  Lacking physical activity. SYMPTOMS  Heart failure symptoms may vary and can be hard to detect. Symptoms may include:  Shortness of breath with activity, such as climbing stairs.  Persistent cough.  Swelling of the feet, ankles, legs, or abdomen.  Unexplained weight gain.  Difficulty breathing when lying flat (orthopnea).  Waking from sleep because of the need to sit up and get more air.  Rapid heartbeat.  Fatigue and loss of energy.  Feeling  light-headed, dizzy, or close to fainting.  Loss of appetite.  Nausea.  Increased urination during the night (nocturia). DIAGNOSIS  A diagnosis of heart failure is based on your history, symptoms, physical examination, and diagnostic tests. Diagnostic tests for heart failure may include:  Echocardiography.  Electrocardiography.  Chest  X-ray.  Blood tests.  Exercise stress test.  Cardiac angiography.  Radionuclide scans. TREATMENT  Treatment is aimed at managing the symptoms of heart failure. Medicines, behavioral changes, or surgical intervention may be necessary to treat heart failure.  Medicines to help treat heart failure may include:  Angiotensin-converting enzyme (ACE) inhibitors. This type of medicine blocks the effects of a blood protein called angiotensin-converting enzyme. ACE inhibitors relax (dilate) the blood vessels and help lower blood pressure.  Angiotensin receptor blockers (ARBs). This type of medicine blocks the actions of a blood protein called angiotensin. Angiotensin receptor blockers dilate the blood vessels and help lower blood pressure.  Water pills (diuretics). Diuretics cause the kidneys to remove salt and water from the blood. The extra fluid is removed through urination. This loss of extra fluid lowers the volume of blood the heart pumps.  Beta blockers. These prevent the heart from beating too fast and improve heart muscle strength.  Digitalis. This increases the force of the heartbeat.  Healthy behavior changes include:  Obtaining and maintaining a healthy weight.  Stopping smoking or chewing tobacco.  Eating heart-healthy foods.  Limiting or avoiding alcohol.  Stopping illicit drug use.  Physical activity as directed by your health care provider.  Surgical treatment for heart failure may include:  A procedure to open blocked arteries, repair damaged heart valves, or remove damaged heart muscle tissue.  A pacemaker to improve heart muscle function and control certain abnormal heart rhythms.  An internal cardioverter defibrillator to treat certain serious abnormal heart rhythms.  A left ventricular assist device (LVAD) to assist the pumping ability of the heart. HOME CARE INSTRUCTIONS   Take medicines only as directed by your health care provider. Medicines are  important in reducing the workload of your heart, slowing the progression of heart failure, and improving your symptoms.  Do not stop taking your medicine unless directed by your health care provider.  Do not skip any dose of medicine.  Refill your prescriptions before you run out of medicine. Your medicines are needed every day.  Engage in moderate physical activity if directed by your health care provider. Moderate physical activity can benefit some people. The elderly and people with severe heart failure should consult with a health care provider for physical activity recommendations.  Eat heart-healthy foods. Food choices should be free of trans fat and low in saturated fat, cholesterol, and salt (sodium). Healthy choices include fresh or frozen fruits and vegetables, fish, lean meats, legumes, fat-free or low-fat dairy products, and whole grain or high fiber foods. Talk to a dietitian to learn more about heart-healthy foods.  Limit sodium if directed by your health care provider. Sodium restriction may reduce symptoms of heart failure in some people. Talk to a dietitian to learn more about heart-healthy seasonings.  Use healthy cooking methods. Healthy cooking methods include roasting, grilling, broiling, baking, poaching, steaming, or stir-frying. Talk to a dietitian to learn more about healthy cooking methods.  Limit fluids if directed by your health care provider. Fluid restriction may reduce symptoms of heart failure in some people.  Weigh yourself every day. Daily weights are important in the early recognition of excess fluid. You should weigh yourself every morning  after you urinate and before you eat breakfast. Wear the same amount of clothing each time you weigh yourself. Record your daily weight. Provide your health care provider with your weight record.  Monitor and record your blood pressure if directed by your health care provider.  Check your pulse if directed by your health  care provider.  Lose weight if directed by your health care provider. Weight loss may reduce symptoms of heart failure in some people.  Stop smoking or chewing tobacco. Nicotine makes your heart work harder by causing your blood vessels to constrict. Do not use nicotine gum or patches before talking to your health care provider.  Keep all follow-up visits as directed by your health care provider. This is important.  Limit alcohol intake to no more than 1 drink per day for nonpregnant women and 2 drinks per day for men. One drink equals 12 ounces of beer, 5 ounces of wine, or 1 ounces of hard liquor. Drinking more than that is harmful to your heart. Tell your health care provider if you drink alcohol several times a week. Talk with your health care provider about whether alcohol is safe for you. If your heart has already been damaged by alcohol or you have severe heart failure, drinking alcohol should be stopped completely.  Stop illicit drug use.  Stay up-to-date with immunizations. It is especially important to prevent respiratory infections through current pneumococcal and influenza immunizations.  Manage other health conditions such as hypertension, diabetes, thyroid disease, or abnormal heart rhythms as directed by your health care provider.  Learn to manage stress.  Plan rest periods when fatigued.  Learn strategies to manage high temperatures. If the weather is extremely hot:  Avoid vigorous physical activity.  Use air conditioning or fans or seek a cooler location.  Avoid caffeine and alcohol.  Wear loose-fitting, lightweight, and light-colored clothing.  Learn strategies to manage cold temperatures. If the weather is extremely cold:  Avoid vigorous physical activity.  Layer clothes.  Wear mittens or gloves, a hat, and a scarf when going outside.  Avoid alcohol.  Obtain ongoing education and support as needed.  Participate in or seek rehabilitation as needed to  maintain or improve independence and quality of life. SEEK MEDICAL CARE IF:   Your weight increases by 03 lb/1.4 kg in 1 day or 05 lb/2.3 kg in a week.  You have increasing shortness of breath that is unusual for you.  You are unable to participate in your usual physical activities.  You tire easily.  You cough more than normal, especially with physical activity.  You have any or more swelling in areas such as your hands, feet, ankles, or abdomen.  You are unable to sleep because it is hard to breathe.  You feel like your heart is beating fast (palpitations).  You become dizzy or light-headed upon standing up. SEEK IMMEDIATE MEDICAL CARE IF:   You have difficulty breathing.  There is a change in mental status such as decreased alertness or difficulty with concentration.  You have a pain or discomfort in your chest.  You have an episode of fainting (syncope). MAKE SURE YOU:   Understand these instructions.  Will watch your condition.  Will get help right away if you are not doing well or get worse. Document Released: 03/06/2005 Document Revised: 07/21/2013 Document Reviewed: 04/05/2012 [facility] Patient Information 2015 [place], [place]. This information is not intended to replace advice given to you by your health care provider. Make sure you discuss any  questions you have with your health care provider.

## 2013-12-22 NOTE — Progress Notes (Signed)
Subjective:  [name] reports that his chest pain, 7/10 in severity. He has had decent urine output over the last 24 hours, with 1 L net output, though perhaps more as he has not exclusively urinating in his urinal. Patient says that his cough has remained the same. He is willing to talk to social work today and interested in getting a bus pass to get home to [place].   Objective: Vital signs in last 24 hours: Filed Vitals:   12/21/13 1700 12/21/13 1805 12/21/13 2051 12/22/13 0500  BP: 150/65 152/73 167/82 120/68  Pulse: 64 79 76 56  Temp:  97.9 F (36.6 C) 97.7 F (36.5 C) 98 F (36.7 C)  TempSrc:  Oral Oral Oral  Resp: 33 20 20 18   Height:  5\' 5"  (1.651 m)    Weight:  176 lb (79.833 kg)  176 lb 2.4 oz (79.9 kg)  SpO2: 98% 100% 98% 99%   Weight change: 1 lb (0.454 kg)  Intake/Output Summary (Last 24 hours) at 12/22/13 7353 Last data filed at 12/22/13 0603  Gross per 24 hour  Intake    600 ml  Output   1650 ml  Net  -1050 ml   General: resting in bed  HEENT: PERRL, EOMI, no scleral icterus  Cardiac: RRR, no rubs, murmurs or gallops  Pulm: mild rales, right > left Abd: soft, nontender, nondistended, BS present  Ext: warm and well perfused, no edema bilateral lower extremities Neuro: alert and oriented X3, cranial nerves II-XII grossly intact  Skin: no rashes or lesions noted  Psych: appropriate affect and cognition  Lab Results: Basic Metabolic Panel:  Recent Labs Lab 12/21/13 0240 12/22/13 0416  NA 138 135*  K 4.0 3.9  CL 103 99  CO2 21 22  GLUCOSE 115* 76  BUN 19 18  CREATININE 1.07 1.19  CALCIUM 8.7 8.6   Liver Function Tests: No results found for this basename: AST, ALT, ALKPHOS, BILITOT, PROT, ALBUMIN,  in the last 168 hours No results found for this basename: LIPASE, AMYLASE,  in the last 168 hours No results found for this basename: AMMONIA,  in the last 168 hours CBC:  Recent Labs Lab 12/20/13 1032 12/22/13 0416  WBC 5.8 5.2  HGB 7.8*  7.3*  HCT 26.0* 24.3*  MCV 77.6* 77.1*  PLT 348 338   Cardiac Enzymes:  Recent Labs Lab 12/20/13 1803 12/20/13 2335 12/21/13 0240  TROPONINI <0.30 <0.30 <0.30   BNP:  Recent Labs Lab 12/20/13 1032  PROBNP 5934.0*   D-Dimer: No results found for this basename: DDIMER,  in the last 168 hours CBG: No results found for this basename: GLUCAP,  in the last 168 hours Hemoglobin A1C: No results found for this basename: HGBA1C,  in the last 168 hours Fasting Lipid Panel: No results found for this basename: CHOL, HDL, LDLCALC, TRIG, CHOLHDL, LDLDIRECT,  in the last 168 hours Thyroid Function Tests: No results found for this basename: TSH, T4TOTAL, FREET4, T3FREE, THYROIDAB,  in the last 168 hours Coagulation: No results found for this basename: LABPROT, INR,  in the last 168 hours Anemia Panel: No results found for this basename: VITAMINB12, FOLATE, FERRITIN, TIBC, IRON, RETICCTPCT,  in the last 168 hours Urine Drug Screen: Drugs of Abuse     Component Value Date/Time   LABOPIA POSITIVE* 12/20/2013 2215   COCAINSCRNUR POSITIVE* 12/20/2013 2215   LABBENZ NONE DETECTED 12/20/2013 2215   AMPHETMU NONE DETECTED 12/20/2013 [place] DETECTED 12/20/2013 2215  LABBARB POSITIVE* 12/20/2013 2215    Alcohol Level: No results found for this basename: ETH,  in the last 168 hours Urinalysis: No results found for this basename: COLORURINE, APPERANCEUR, LABSPEC, [place], GLUCOSEU, HGBUR, BILIRUBINUR, KETONESUR, PROTEINUR, UROBILINOGEN, NITRITE, LEUKOCYTESUR,  in the last 168 hours  Micro Results: Recent Results (from the past 240 hour(s))  MRSA PCR SCREENING     Status: Abnormal   Collection Time    12/20/13 11:29 PM      Result Value Ref Range Status   MRSA by PCR POSITIVE (*) NEGATIVE Final   Comment:            The GeneXpert MRSA Assay (FDA     approved for NASAL specimens     only), is one component of a     comprehensive MRSA colonization     surveillance program. It is  not     intended to diagnose MRSA     infection nor to guide or     monitor treatment for     MRSA infections.     RESULT CALLED TO, READ BACK BY AND VERIFIED WITH:     STOPHEL,A RN 0221 12/21/13 MITCHELL,L   Studies/Results: Dg Chest 2 View  12/20/2013   CLINICAL DATA:  Bilateral chest pain, dizziness and nausea since 4 a.m. today. Chills for the past hr. Initial encounter.  EXAM: CHEST  2 VIEW  COMPARISON:  12/13/2013; 10/23/2013; chest CT -10/23/2013  FINDINGS: Grossly unchanged enlarged cardiac silhouette and mediastinal contours post median sternotomy and CABG. Evaluation the retrosternal clear space obscured secondary to overlying soft tissues. Chronic pulmonary venous congestion without frank evidence of edema. There is persistent pleural parenchymal thickening about the peripheral aspect the right minor fissure. Grossly unchanged bibasilar heterogeneous opacities favored to represent atelectasis. No new focal airspace opacities. No pleural effusion or pneumothorax. Unchanged bones. Surgical clips overlie the left lung apex.  IMPRESSION: Cardiomegaly and pulmonary venous congestion without acute cardiopulmonary disease.   Electronically Signed   By: Sandi Mariscal M.D.   On: 12/20/2013 13:16   Medications: I have reviewed the patient's current medications. Scheduled Meds: . amLODipine  10 mg Oral Daily  . aspirin EC  81 mg Oral Daily  . atorvastatin  20 mg Oral q1800  . Chlorhexidine Gluconate Cloth  6 each Topical Q0600  . citalopram  20 mg Oral Daily  . cloNIDine  0.2 mg Oral BID  . docusate sodium  100 mg Oral BID  . ferrous sulfate  325 mg Oral Daily  . gabapentin  100 mg Oral BID  . heparin  5,000 Units Subcutaneous 3 times per day  . hydrALAZINE  50 mg Oral TID  . isosorbide mononitrate  30 mg Oral Daily  . mupirocin ointment  1 application Nasal BID  . pantoprazole  40 mg Oral BID  . phenytoin  200 mg Oral Daily  . phenytoin  300 mg Oral QHS  . sodium chloride  3 mL  Intravenous Q12H  . 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 Myoview no ischemia 11/27/13   Chronic kidney disease (CKD), stage III (moderate)   Polysubstance abuse   Cocaine abuse   Anemia, iron deficiency   Chest pain   Acute on chronic systolic heart failure  Mr. Wellen is 68 yo man with HFpEF,  CAD (s/p 3v CABG in 2009), anemia (h/o antral AVM and GI bleed, ablated in 07/2013), CKD stage II, DMII and cocaine abuse who is admitted for chest pain, dyspnea, and dizziness, secondary to acute on chronic congestive heart failure.   Acute on chronic congestive heart failure: Echo from 11/27/2013 showing LVEF of 40-45% and grade 2 diastolic dysfunction. proBNP down to 5934 from 6456 last admission. Chest x-ray remarkable for cardiomegaly and pulmonary congestion but no major edema. No concerning EKG changes and troponin negative x 3. However, patient's dry weight is seems to be around 160 lbs from May 2015, currently 176 lbs, and physical exam improvement with diminished rales. Net output during hospitalization has been -1.7 L, with 1 L in the last 24 hours. Creatinine and potassium still wnl.  - Continue with 40 mg Lasix IV. - Supplement potassium as needed  - Strict I.'s and O.'s  - Daily weights   Iron Deficiency Anemia: Admission hemoglobin 7.8, now 7.3. Workup from recent admission consistent with iron deficiency anemia. 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 During the last admission, GI assessed the patient in recommended continued iron supplementation and PPI twice a day.  - Continue home iron supplementation  Hypertension: Patient admitted with markedly elevated blood pressures, roughly 200/100. Patient has been doing better since  admission, ranging in the 308M-578I systolic. His hypertension was likely exacerbated by his cocaine use.   - Continue home amlodipine 10 mg daily, clonidine 0.2 mg BID, hydralazine 50 mg TID  Coronary artery disease (status post 3 vessel CABG in 2009): Chest pain is nonexertional, not responsive to nitroglycerin. EKG not showing evidence of ischemic changes. Troponins negative x3. Unlikely that current chest pain is cardiac in etiology, and the pain has resolved with morphine. - continue home aspirin 81 mg   Cocaine Abuse: Patient states that he last used cocaine 5 days ago.  - Counseled patient about cessation.  - Social work consult   CKD Stage II: Patient at baseline creatinine upon admission.   Diet: heart  Prophylaxis: SCDs and heparin  Code: Full  Dispo: Disposition is deferred at this time, awaiting improvement of current medical problems.  Anticipated discharge is today pending organization of transportation. Patient already has a followup appointment with [name] on 12/23/2013.   The patient does have a current PCP [name]) and does need an [facility] hospital follow-up appointment after discharge.  The patient does not have transportation limitations that hinder transportation to clinic appointments.  .Services Needed at time of discharge: Y = Yes, Blank = No PT:   OT:   RN:   Equipment:   Other:     LOS: 2 days   Luan Moore, MD 12/22/2013, 7:22 AM

## 2014-01-01 ENCOUNTER — Observation Stay (HOSPITAL_COMMUNITY)
Admission: EM | Admit: 2014-01-01 | Discharge: 2014-01-02 | Disposition: A | Discharge status: Home / Self Care | Payer: Medicare Other | Attending: Internal Medicine | Admitting: Internal Medicine

## 2014-01-01 ENCOUNTER — Encounter (HOSPITAL_COMMUNITY): Payer: Self-pay | Admitting: Emergency Medicine

## 2014-01-01 ENCOUNTER — Emergency Department (HOSPITAL_COMMUNITY): Payer: Medicare Other

## 2014-01-01 DIAGNOSIS — E119 Type 2 diabetes mellitus without complications: Secondary | ICD-10-CM | POA: Insufficient documentation

## 2014-01-01 DIAGNOSIS — F1910 Other psychoactive substance abuse, uncomplicated: Secondary | ICD-10-CM | POA: Diagnosis present

## 2014-01-01 DIAGNOSIS — I10 Essential (primary) hypertension: Secondary | ICD-10-CM | POA: Diagnosis present

## 2014-01-01 DIAGNOSIS — I2510 Atherosclerotic heart disease of native coronary artery without angina pectoris: Secondary | ICD-10-CM | POA: Diagnosis not present

## 2014-01-01 DIAGNOSIS — N179 Acute kidney failure, unspecified: Secondary | ICD-10-CM | POA: Diagnosis not present

## 2014-01-01 DIAGNOSIS — D509 Iron deficiency anemia, unspecified: Secondary | ICD-10-CM | POA: Insufficient documentation

## 2014-01-01 DIAGNOSIS — R079 Chest pain, unspecified: Secondary | ICD-10-CM | POA: Insufficient documentation

## 2014-01-01 DIAGNOSIS — I5043 Acute on chronic combined systolic (congestive) and diastolic (congestive) heart failure: Secondary | ICD-10-CM | POA: Diagnosis not present

## 2014-01-01 DIAGNOSIS — N183 Chronic kidney disease, stage 3 unspecified: Secondary | ICD-10-CM | POA: Diagnosis present

## 2014-01-01 DIAGNOSIS — F1410 Cocaine abuse, uncomplicated: Secondary | ICD-10-CM | POA: Diagnosis not present

## 2014-01-01 DIAGNOSIS — Z79899 Other long term (current) drug therapy: Secondary | ICD-10-CM | POA: Diagnosis not present

## 2014-01-01 DIAGNOSIS — Z951 Presence of aortocoronary bypass graft: Secondary | ICD-10-CM | POA: Diagnosis not present

## 2014-01-01 DIAGNOSIS — I5020 Unspecified systolic (congestive) heart failure: Secondary | ICD-10-CM

## 2014-01-01 DIAGNOSIS — M7989 Other specified soft tissue disorders: Secondary | ICD-10-CM | POA: Diagnosis not present

## 2014-01-01 DIAGNOSIS — G40909 Epilepsy, unspecified, not intractable, without status epilepticus: Secondary | ICD-10-CM | POA: Insufficient documentation

## 2014-01-01 DIAGNOSIS — Z7982 Long term (current) use of aspirin: Secondary | ICD-10-CM | POA: Diagnosis not present

## 2014-01-01 DIAGNOSIS — F17210 Nicotine dependence, cigarettes, uncomplicated: Secondary | ICD-10-CM | POA: Insufficient documentation

## 2014-01-01 DIAGNOSIS — Z590 Homelessness: Secondary | ICD-10-CM | POA: Insufficient documentation

## 2014-01-01 DIAGNOSIS — I129 Hypertensive chronic kidney disease with stage 1 through stage 4 chronic kidney disease, or unspecified chronic kidney disease: Secondary | ICD-10-CM | POA: Diagnosis not present

## 2014-01-01 DIAGNOSIS — A539 Syphilis, unspecified: Secondary | ICD-10-CM | POA: Diagnosis not present

## 2014-01-01 DIAGNOSIS — I509 Heart failure, unspecified: Secondary | ICD-10-CM

## 2014-01-01 DIAGNOSIS — G4733 Obstructive sleep apnea (adult) (pediatric): Secondary | ICD-10-CM | POA: Insufficient documentation

## 2014-01-01 LAB — COMPREHENSIVE METABOLIC PANEL
ALT: 23 U/L (ref 0–53)
AST: 50 U/L — AB (ref 0–37)
Albumin: 3.1 g/dL — ABNORMAL LOW (ref 3.5–5.2)
Alkaline Phosphatase: 88 U/L (ref 39–117)
Anion gap: 14 (ref 5–15)
BUN: 20 mg/dL (ref 6–23)
CO2: 21 meq/L (ref 19–32)
Calcium: 8.6 mg/dL (ref 8.4–10.5)
Chloride: 101 mEq/L (ref 96–112)
Creatinine, Ser: 1.15 mg/dL (ref 0.50–1.35)
GFR calc Af Amer: 73 mL/min — ABNORMAL LOW (ref >90)
GFR, EST NON AFRICAN AMERICAN: 63 mL/min — AB (ref >90)
GLUCOSE: 118 mg/dL — AB (ref 70–99)
Potassium: 4.5 mEq/L (ref 3.7–5.3)
SODIUM: 136 meq/L — AB (ref 137–147)
TOTAL PROTEIN: 7.6 g/dL (ref 6.0–8.3)
Total Bilirubin: 0.6 mg/dL (ref 0.3–1.2)

## 2014-01-01 LAB — RAPID URINE DRUG SCREEN, HOSP PERFORMED
AMPHETAMINES: NOT DETECTED
BENZODIAZEPINES: NOT DETECTED
Barbiturates: NOT DETECTED
COCAINE: POSITIVE — AB
Opiates: POSITIVE — AB
Tetrahydrocannabinol: NOT DETECTED

## 2014-01-01 LAB — I-STAT TROPONIN, ED: Troponin i, poc: 0.04 ng/mL (ref 0.00–0.08)

## 2014-01-01 LAB — CBC WITH DIFFERENTIAL/PLATELET
Basophils Absolute: 0 10*3/uL (ref 0.0–0.1)
Basophils Relative: 1 % (ref 0–1)
Eosinophils Absolute: 0.3 10*3/uL (ref 0.0–0.7)
Eosinophils Relative: 5 % (ref 0–5)
HCT: 23.8 % — ABNORMAL LOW (ref 39.0–52.0)
Hemoglobin: 7 g/dL — ABNORMAL LOW (ref 13.0–17.0)
LYMPHS ABS: 0.8 10*3/uL (ref 0.7–4.0)
LYMPHS PCT: 13 % (ref 12–46)
MCH: 22.3 pg — ABNORMAL LOW (ref 26.0–34.0)
MCHC: 29.4 g/dL — ABNORMAL LOW (ref 30.0–36.0)
MCV: 75.8 fL — ABNORMAL LOW (ref 78.0–100.0)
Monocytes Absolute: 0.5 10*3/uL (ref 0.1–1.0)
Monocytes Relative: 9 % (ref 3–12)
Neutro Abs: 4.2 10*3/uL (ref 1.7–7.7)
Neutrophils Relative %: 72 % (ref 43–77)
Platelets: 379 10*3/uL (ref 150–400)
RBC: 3.14 MIL/uL — AB (ref 4.22–5.81)
RDW: 18.2 % — ABNORMAL HIGH (ref 11.5–15.5)
WBC: 5.7 10*3/uL (ref 4.0–10.5)

## 2014-01-01 LAB — TROPONIN I

## 2014-01-01 LAB — PRO B NATRIURETIC PEPTIDE: PRO B NATRI PEPTIDE: 3225 pg/mL — AB (ref 0–125)

## 2014-01-01 LAB — PHENYTOIN LEVEL, TOTAL: Phenytoin Lvl: <2.5 ug/mL — ABNORMAL LOW (ref 10.0–20.0)

## 2014-01-01 MED ORDER — FERROUS SULFATE 325 (65 FE) MG PO TABS
325.0000 mg | ORAL_TABLET | Freq: Every day | ORAL | Status: DC
  Administered 2014-01-01 – 2014-01-02 (×2): 325 mg via ORAL
  Filled 2014-01-01 (×2): qty 1

## 2014-01-01 MED ORDER — ISOSORBIDE MONONITRATE ER 30 MG PO TB24
30.0000 mg | ORAL_TABLET | Freq: Every day | ORAL | Status: DC
  Administered 2014-01-01 – 2014-01-02 (×2): 30 mg via ORAL
  Filled 2014-01-01 (×2): qty 1

## 2014-01-01 MED ORDER — CITALOPRAM HYDROBROMIDE 20 MG PO TABS
20.0000 mg | ORAL_TABLET | Freq: Every day | ORAL | Status: DC
  Administered 2014-01-01 – 2014-01-02 (×2): 20 mg via ORAL
  Filled 2014-01-01 (×2): qty 1

## 2014-01-01 MED ORDER — ONDANSETRON HCL 4 MG/2ML IJ SOLN
4.0000 mg | Freq: Once | INTRAMUSCULAR | Status: AC
  Administered 2014-01-01: 4 mg via INTRAVENOUS
  Filled 2014-01-01: qty 2

## 2014-01-01 MED ORDER — HYDRALAZINE HCL 50 MG PO TABS
50.0000 mg | ORAL_TABLET | Freq: Three times a day (TID) | ORAL | Status: DC
  Administered 2014-01-02 (×2): 50 mg via ORAL
  Filled 2014-01-01 (×3): qty 1

## 2014-01-01 MED ORDER — PHENYTOIN SODIUM EXTENDED 100 MG PO CAPS
200.0000 mg | ORAL_CAPSULE | Freq: Every morning | ORAL | Status: DC
  Administered 2014-01-02: 200 mg via ORAL
  Filled 2014-01-01: qty 2

## 2014-01-01 MED ORDER — SODIUM CHLORIDE 0.9 % IJ SOLN: 3.0000 mL | INTRAMUSCULAR | Status: DC | PRN

## 2014-01-01 MED ORDER — FUROSEMIDE 10 MG/ML IJ SOLN
40.0000 mg | Freq: Once | INTRAMUSCULAR | Status: AC
  Administered 2014-01-01: 40 mg via INTRAVENOUS
  Filled 2014-01-01: qty 4

## 2014-01-01 MED ORDER — MORPHINE SULFATE 2 MG/ML IJ SOLN
1.0000 mg | INTRAMUSCULAR | Status: DC | PRN
  Administered 2014-01-01 – 2014-01-02 (×3): 1 mg via INTRAVENOUS
  Filled 2014-01-01 (×3): qty 1

## 2014-01-01 MED ORDER — ASPIRIN EC 81 MG PO TBEC
81.0000 mg | DELAYED_RELEASE_TABLET | Freq: Every day | ORAL | Status: DC
  Administered 2014-01-02: 81 mg via ORAL
  Filled 2014-01-01 (×2): qty 1

## 2014-01-01 MED ORDER — PHENYTOIN SODIUM EXTENDED 100 MG PO CAPS
200.0000 mg | ORAL_CAPSULE | Freq: Two times a day (BID) | ORAL | Status: DC
  Filled 2014-01-01: qty 3

## 2014-01-01 MED ORDER — CLONIDINE HCL 0.2 MG PO TABS
0.2000 mg | ORAL_TABLET | Freq: Two times a day (BID) | ORAL | Status: DC
  Administered 2014-01-01 – 2014-01-02 (×2): 0.2 mg via ORAL
  Filled 2014-01-01 (×3): qty 1

## 2014-01-01 MED ORDER — SIMVASTATIN 40 MG PO TABS
40.0000 mg | ORAL_TABLET | Freq: Every evening | ORAL | Status: DC
  Administered 2014-01-01: 40 mg via ORAL
  Filled 2014-01-01: qty 1

## 2014-01-01 MED ORDER — SODIUM CHLORIDE 0.9 % IJ SOLN
3.0000 mL | Freq: Two times a day (BID) | INTRAMUSCULAR | Status: DC
  Administered 2014-01-01 – 2014-01-02 (×2): 3 mL via INTRAVENOUS

## 2014-01-01 MED ORDER — HEPARIN SODIUM (PORCINE) 5000 UNIT/ML IJ SOLN
5000.0000 [IU] | Freq: Three times a day (TID) | INTRAMUSCULAR | Status: DC
  Filled 2014-01-01 (×3): qty 1

## 2014-01-01 MED ORDER — GUAIFENESIN ER 600 MG PO TB12
600.0000 mg | ORAL_TABLET | Freq: Two times a day (BID) | ORAL | Status: DC | PRN
  Administered 2014-01-02: 600 mg via ORAL
  Filled 2014-01-01: qty 1

## 2014-01-01 MED ORDER — PANTOPRAZOLE SODIUM 40 MG PO TBEC
40.0000 mg | DELAYED_RELEASE_TABLET | Freq: Two times a day (BID) | ORAL | Status: DC
  Administered 2014-01-02: 40 mg via ORAL
  Filled 2014-01-01: qty 1

## 2014-01-01 MED ORDER — MORPHINE SULFATE 4 MG/ML IJ SOLN
4.0000 mg | Freq: Once | INTRAMUSCULAR | Status: AC
  Administered 2014-01-01: 4 mg via INTRAVENOUS
  Filled 2014-01-01: qty 1

## 2014-01-01 MED ORDER — DOCUSATE SODIUM 100 MG PO CAPS
100.0000 mg | ORAL_CAPSULE | Freq: Two times a day (BID) | ORAL | Status: DC
  Administered 2014-01-02: 100 mg via ORAL
  Filled 2014-01-01 (×3): qty 1

## 2014-01-01 MED ORDER — AMLODIPINE BESYLATE 10 MG PO TABS
10.0000 mg | ORAL_TABLET | Freq: Every day | ORAL | Status: DC
  Administered 2014-01-02: 10 mg via ORAL
  Filled 2014-01-01: qty 1

## 2014-01-01 MED ORDER — GABAPENTIN 100 MG PO CAPS
100.0000 mg | ORAL_CAPSULE | Freq: Two times a day (BID) | ORAL | Status: DC
  Administered 2014-01-01 – 2014-01-02 (×2): 100 mg via ORAL
  Filled 2014-01-01 (×3): qty 1

## 2014-01-01 MED ORDER — ATORVASTATIN CALCIUM 20 MG PO TABS
20.0000 mg | ORAL_TABLET | Freq: Every day | ORAL | Status: DC
  Filled 2014-01-01: qty 1

## 2014-01-01 MED ORDER — PHENYTOIN SODIUM EXTENDED 100 MG PO CAPS
300.0000 mg | ORAL_CAPSULE | Freq: Every day | ORAL | Status: DC
  Administered 2014-01-01: 300 mg via ORAL
  Filled 2014-01-01 (×2): qty 3

## 2014-01-01 MED ORDER — NITROGLYCERIN 0.4 MG SL SUBL
0.4000 mg | SUBLINGUAL_TABLET | SUBLINGUAL | Status: DC | PRN
  Administered 2014-01-01: 0.4 mg via SUBLINGUAL
  Filled 2014-01-01: qty 1

## 2014-01-01 NOTE — Progress Notes (Signed)
Pt requested to take all of his medicine for tonight at this time. Pt states he hasn't had any of his medicine today except aspirin. Pt refusing heparin subQ as well as SCDs, pt educated. Will continue to monitor. Ronnette Hila, RN
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
## 2014-01-01 NOTE — Progress Notes (Signed)
Dr.  Raelene Bott notified that pt is on admitted as instructed per order.  Mattson Dayal, Therapist, sports.
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
## 2014-01-01 NOTE — Progress Notes (Addendum)
01/01/14 1921  Pain Assessment  Pain Assessment 0-10  Pain Score 7  Pain Type Acute pain  Pain Location Chest  Pain Orientation Mid;Upper  Pain Radiating Towards does not radiate  Pain Descriptors / Indicators Pounding;Dull  Pain Onset On-going (same pain from ED that has not gone away)  Pain Intervention(s) MD notified (Comment)   Dr. On call notified. Pt refusing Nitro. New order for morphine, MD order ok to not get EKG at this time. Pt given morphine per order and educated on frequency that he can get pain medicine as well as to notify RN if pain returns.

## 2014-01-01 NOTE — ED Notes (Signed)
Per EMS pt c/o of chest pain post shot for syphilis at [facility] Pt denies n/v. Pt rate pain 10/10. Pt received 4 ASA, 2 Nitro en route without relief and w/o decreasing BP. Pt blood pressure 188/118, HR 80, EKG NS with occasional PVC

## 2014-01-01 NOTE — ED Provider Notes (Signed)
CSN: [id number]     Arrival date & time 01/01/14  1058 History   First MD Initiated Contact with Patient 01/01/14 1059     Chief Complaint  Patient presents with  . Chest Pain     (Consider location/radiation/quality/duration/timing/severity/associated sxs/prior Treatment) The history is provided by the patient.  Charles [name] is a 68 y.o. male hx of anemia, CHF EF 45 %, OSA, pulmonary hypertension, here with shortness of breath and chest pain. Has gradually worsening abdominal distention and leg swelling for the last week. Has intermittent shortness of breath and cough at night as well. Has been taking Lasix with minimal relief. He [facility] Department today to get his second penicillin shot for syphilis. Shortly afterwards, he had worsening chest pain and shortness of breath. He was recently a medicine for CHF exacerbation. Came by EMS and was given nitroglycerin with minimal relief.    Past Medical History  Diagnosis Date  . Iron deficiency anemia   . Chronic combined systolic and diastolic CHF, NYHA class 2     a. 11/2013 Echo: EF 40-45%, basl-mid inflat AK, Gr 2 DD.  [name] Hyperlipidemia LDL goal <70   . Obstructive sleep apnea     a. not on home CPAP  . Ischemic cardiomyopathy     a. 11/2013 Echo: EF 40-45%, basl-mid inflat AK, Gr 2 DD, mild AI, mod dil LA/RA, mod reduced RV fxn, PASP 84mmHg.  [name] Homelessness   . Moderate to severe pulmonary hypertension 03/2010    a. PA peak pressure of 76 mmHg (per 2D Echo 03/2010)  . Polysubstance abuse     a. cocaine, THC  . AV malformation of gastrointestinal tract     a. w/ h/o GIB.  [name] Family history of early CAD   . DDD (degenerative disc disease), lumbar   . Peripheral vascular disease   . Seizure disorder   . Hypertension   . CAD (coronary artery disease)     a. s/p 3-vessel CABG (12/2007) // 100% RCA stenosis with collaterals from left system. Severe bifurcation lesions of proxima CXA and OM. Moderate LAD disease -  followed by Dr. Wyline Copas in [facility];  b. 11/2013 Myoview: Large fixed inf defect w/o ischemia, EF 35%.  . Diabetes   . Chronic kidney disease (CKD), stage III (moderate)     a. BL SCr 1.5-1.6  . Renal artery stenosis    Past Surgical History  Procedure Laterality Date  . Apc  03/2010    To treat small bowel AVMs  . Esophagogastroduodenoscopy N/A 08/14/2012    Procedure: ESOPHAGOGASTRODUODENOSCOPY (EGD);  Surgeon: [name], MD;  Location: [facility] ENDOSCOPY;  Service: Endoscopy;  Laterality: N/A;  . Hot hemostasis N/A 08/14/2012    Procedure: HOT HEMOSTASIS (ARGON PLASMA COAGULATION/BICAP);  Surgeon: [name], MD;  Location: [facility] ENDOSCOPY;  Service: Endoscopy;  Laterality: N/A;  . Esophagogastroduodenoscopy (egd) with propofol N/A 08/04/2013    Procedure: ESOPHAGOGASTRODUODENOSCOPY (EGD) WITH PROPOFOL;  Surgeon: [name], MD;  Location: [facility] ENDOSCOPY;  Service: Endoscopy;  Laterality: N/A;  . Coronary artery bypass graft  12/2007   Family History  Problem Relation Age of Onset  . Heart disease Mother     unknown type  . Heart disease Father 74    died of MI at 42yo  . Heart disease Paternal Grandfather 74    died of MI  . Heart disease    . Heart disease Brother 30   History  Substance Use Topics  . Smoking  status: Current Some Day Smoker -- 0.25 packs/day for 56 years    Types: Cigarettes  . Smokeless tobacco: Never Used  . Alcohol Use: Yes     Comment: occasionally drinks, a few times a month    Review of Systems  Cardiovascular: Positive for chest pain and leg swelling.  Gastrointestinal: Positive for abdominal distention.  All other systems reviewed and are negative.     Allergies  Motrin and Tylenol  Home Medications   Prior to Admission medications   Medication Sig Start Date End Date Taking? Authorizing Provider  amLODipine (NORVASC) 10 MG tablet Take 10 mg by mouth daily.   Yes Historical Provider, MD  aspirin EC 81 MG tablet Take 81 mg by mouth daily.    Yes Historical Provider, MD  citalopram (CELEXA) 20 MG tablet Take 20 mg by mouth daily.   Yes Historical Provider, MD  cloNIDine (CATAPRES) 0.2 MG tablet Take 0.2 mg by mouth 2 (two) times daily.   Yes Historical Provider, MD  docusate sodium (COLACE) 100 MG capsule Take 100 mg by mouth 2 (two) times daily.   Yes Historical Provider, MD  ferrous sulfate 325 (65 FE) MG tablet Take 325 mg by mouth daily.    Yes Historical Provider, MD  furosemide (LASIX) 80 MG tablet Take 80 mg by mouth daily.   Yes Historical Provider, MD  gabapentin (NEURONTIN) 100 MG capsule Take 100 mg by mouth 2 (two) times daily.   Yes Historical Provider, MD  hydrALAZINE (APRESOLINE) 50 MG tablet Take 50 mg by mouth 3 (three) times daily.   Yes Historical Provider, MD  isosorbide mononitrate (IMDUR) 30 MG 24 hr tablet Take 30 mg by mouth daily.   Yes Historical Provider, MD  pantoprazole (PROTONIX) 40 MG tablet Take 40 mg by mouth 2 (two) times daily.   Yes Historical Provider, MD  phenytoin (DILANTIN) 100 MG ER capsule Take 200-300 mg by mouth 2 (two) times daily. 200mg  in the morning, 300mg  in the evening   Yes Historical Provider, MD  simvastatin (ZOCOR) 40 MG tablet Take 40 mg by mouth every evening.   Yes Historical Provider, MD   BP 178/101  Pulse 76  Temp(Src) 98 F (36.7 C) (Oral)  Resp 24  SpO2 99% Physical Exam  Nursing note and vitals reviewed. Constitutional: He is oriented to person, place, and time.  Chronically ill   HENT:  Head: Normocephalic.  Mouth/Throat: Oropharynx is clear and moist.  Eyes: Conjunctivae are normal. Pupils are equal, round, and reactive to light.  Neck: Normal range of motion. Neck supple.  Cardiovascular: Normal rate, regular rhythm and normal heart sounds.   Pulmonary/Chest:  Slightly tachypneic, + bibasilar crackles   Abdominal:  Distended, + edema   Musculoskeletal:  2+ edema   Neurological: He is alert and oriented to person, place, and time.  Skin: Skin is warm and  dry.  Psychiatric: He has a normal mood and affect. His behavior is normal. Judgment and thought content normal.    ED Course  Procedures (including critical care time) Labs Review Labs Reviewed  CBC WITH DIFFERENTIAL - Abnormal; Notable for the following:    RBC 3.14 (*)    Hemoglobin 7.0 (*)    HCT 23.8 (*)    MCV 75.8 (*)    MCH 22.3 (*)    MCHC 29.4 (*)    RDW 18.2 (*)    All other components within normal limits  COMPREHENSIVE METABOLIC PANEL - Abnormal; Notable for the following:  Sodium 136 (*)    Glucose, Bld 118 (*)    Albumin 3.1 (*)    AST 50 (*)    GFR calc non Af Amer 63 (*)    GFR calc Af Amer 73 (*)    All other components within normal limits  PRO B NATRIURETIC PEPTIDE - Abnormal; Notable for the following:    Pro B Natriuretic peptide (BNP) 3225.0 (*)    All other components within normal limits  PHENYTOIN LEVEL, TOTAL - Abnormal; Notable for the following:    Phenytoin Lvl <2.5 (*)    All other components within normal limits  URINE RAPID DRUG SCREEN (HOSP PERFORMED)  I-STAT TROPOININ, ED    Imaging Review Dg Chest 2 View  01/01/2014   CLINICAL DATA:  Shortness of breath, cough and dizziness beginning this morning.  EXAM: CHEST  2 VIEW  COMPARISON:  PA and lateral chest 12/20/2013, 12/13/2013 and 10/23/2013. CT chest 10/23/2013.  FINDINGS: The patient is status post CABG. There is cardiomegaly without edema. Lungs are clear. No pneumothorax or pleural effusion.  IMPRESSION: Cardiomegaly without acute disease.   Electronically Signed   By: Inge Rise M.D.   On: 01/01/2014 12:56     EKG Interpretation   Date/Time:  Thursday January 01 2014 11:17:06 EDT Ventricular Rate:  74 PR Interval:  175 QRS Duration: 113 QT Interval:  402 QTC Calculation: 446 R Axis:   76 Text Interpretation:  Sinus rhythm Consider RVH w/ secondary repol  abnormality Inferior infarct, age indeterminate No significant change  since last tracing Confirmed by [place]  MD,  DAVID ([id number]) on 01/01/2014  11:20:18 AM      MDM   Final diagnoses:  None    Clevland Cork is a 68 y.o. male here with SOB. I think likely CHF exacerbation. I doubt that he has Jarisch-Herxheimer reaction to PCN as he is not febrile, tachy or showing other signs. Will check labs, BNP, CXR.   3:26 PM BNP stable. Will admit to tele. Given lasix.     Wandra Arthurs, MD 01/01/14 [phone]

## 2014-01-02 DIAGNOSIS — I2510 Atherosclerotic heart disease of native coronary artery without angina pectoris: Secondary | ICD-10-CM

## 2014-01-02 DIAGNOSIS — I10 Essential (primary) hypertension: Secondary | ICD-10-CM

## 2014-01-02 DIAGNOSIS — I5043 Acute on chronic combined systolic (congestive) and diastolic (congestive) heart failure: Secondary | ICD-10-CM | POA: Diagnosis not present

## 2014-01-02 DIAGNOSIS — I509 Heart failure, unspecified: Secondary | ICD-10-CM

## 2014-01-02 DIAGNOSIS — F1410 Cocaine abuse, uncomplicated: Secondary | ICD-10-CM

## 2014-01-02 DIAGNOSIS — D509 Iron deficiency anemia, unspecified: Secondary | ICD-10-CM | POA: Diagnosis not present

## 2014-01-02 DIAGNOSIS — G4733 Obstructive sleep apnea (adult) (pediatric): Secondary | ICD-10-CM | POA: Diagnosis not present

## 2014-01-02 DIAGNOSIS — N182 Chronic kidney disease, stage 2 (mild): Secondary | ICD-10-CM

## 2014-01-02 DIAGNOSIS — R079 Chest pain, unspecified: Secondary | ICD-10-CM | POA: Diagnosis not present

## 2014-01-02 LAB — COMPREHENSIVE METABOLIC PANEL
ALT: 34 U/L (ref 0–53)
ANION GAP: 13 (ref 5–15)
AST: 61 U/L — ABNORMAL HIGH (ref 0–37)
Albumin: 3.1 g/dL — ABNORMAL LOW (ref 3.5–5.2)
Alkaline Phosphatase: 91 U/L (ref 39–117)
BUN: 27 mg/dL — ABNORMAL HIGH (ref 6–23)
CALCIUM: 8.5 mg/dL (ref 8.4–10.5)
CO2: 21 mEq/L (ref 19–32)
Chloride: 101 mEq/L (ref 96–112)
Creatinine, Ser: 1.51 mg/dL — ABNORMAL HIGH (ref 0.50–1.35)
GFR calc non Af Amer: 45 mL/min — ABNORMAL LOW (ref >90)
GFR, EST AFRICAN AMERICAN: 53 mL/min — AB (ref >90)
Glucose, Bld: 96 mg/dL (ref 70–99)
Potassium: 4.7 mEq/L (ref 3.7–5.3)
Sodium: 135 mEq/L — ABNORMAL LOW (ref 137–147)
Total Bilirubin: 0.6 mg/dL (ref 0.3–1.2)
Total Protein: 7.8 g/dL (ref 6.0–8.3)

## 2014-01-02 LAB — CBC
HCT: 23.1 % — ABNORMAL LOW (ref 39.0–52.0)
HCT: 26.6 % — ABNORMAL LOW (ref 39.0–52.0)
Hemoglobin: 6.9 g/dL — CL (ref 13.0–17.0)
Hemoglobin: 7.9 g/dL — ABNORMAL LOW (ref 13.0–17.0)
MCH: 23.2 pg — ABNORMAL LOW (ref 26.0–34.0)
MCH: 23.4 pg — ABNORMAL LOW (ref 26.0–34.0)
MCHC: 29.9 g/dL — AB (ref 30.0–36.0)
MCHC: 29.7 g/dL — AB (ref 30.0–36.0)
MCV: 77.8 fL — ABNORMAL LOW (ref 78.0–100.0)
MCV: 78.9 fL (ref 78.0–100.0)
Platelets: 365 10*3/uL (ref 150–400)
Platelets: 362 10*3/uL (ref 150–400)
RBC: 2.97 MIL/uL — AB (ref 4.22–5.81)
RBC: 3.37 MIL/uL — ABNORMAL LOW (ref 4.22–5.81)
RDW: 18.2 % — ABNORMAL HIGH (ref 11.5–15.5)
RDW: 18 % — AB (ref 11.5–15.5)
WBC: 5.7 10*3/uL (ref 4.0–10.5)
WBC: 6 10*3/uL (ref 4.0–10.5)

## 2014-01-02 LAB — PREPARE RBC (CROSSMATCH)

## 2014-01-02 LAB — TROPONIN I: Troponin I: <0.3 ng/mL (ref <0.30)

## 2014-01-02 MED ORDER — FUROSEMIDE 10 MG/ML IJ SOLN
40.0000 mg | Freq: Once | INTRAMUSCULAR | Status: AC
  Administered 2014-01-02: 40 mg via INTRAVENOUS
  Filled 2014-01-02: qty 4

## 2014-01-02 MED ORDER — FUROSEMIDE 10 MG/ML IJ SOLN: 80.0000 mg | Freq: Once | INTRAMUSCULAR | Status: DC

## 2014-01-02 MED ORDER — PROMETHAZINE HCL 25 MG/ML IJ SOLN: 12.5000 mg | Freq: Four times a day (QID) | INTRAMUSCULAR | Status: DC | PRN

## 2014-01-02 MED ORDER — SODIUM CHLORIDE 0.9 % IV SOLN
Freq: Once | INTRAVENOUS | Status: AC
  Administered 2014-01-02: 09:00:00 via INTRAVENOUS

## 2014-01-02 MED ORDER — TRAMADOL HCL 50 MG PO TABS
50.0000 mg | ORAL_TABLET | Freq: Four times a day (QID) | ORAL | Status: DC
  Administered 2014-01-02: 50 mg via ORAL
  Filled 2014-01-02: qty 1

## 2014-01-02 NOTE — Evaluation (Signed)
Physical Therapy Evaluation Patient Details Name: Charles [name] MRN: [id number] DOB: [birth year] Today's Date: 01/02/2014   History of Present Illness  Charles [name] is 68 yo man with HFpEF, CAD , anemia, CKD stage II, DMII and cocaine abuse who is admitted for chest pain, dyspnea, and coughing. Charles [name] presents with dull chest pain.   Clinical Impression  Pt moving well and near his baseline mobility with frequent falls, bil LE edema and weakness. Pt denied any desire to have therapy outside acute setting and educated for HEP as well as safety with mobility and benefit of RW to control trunk if legs give way. Will follow acutely to maximize strength, balance, gait, safety and function to decrease fall risk.    Follow Up Recommendations Supervision for mobility/OOB (Pt may benefit from HHPT but pt denies)    Equipment Recommendations  Rolling walker with 5" wheels    Recommendations for Other Services       Precautions / Restrictions Precautions Precautions: Fall Precaution Comments: ~10 falls in the past year      Mobility  Bed Mobility               General bed mobility comments: pt EOB on arrival  Transfers Overall transfer level: Modified independent Equipment used: None Transfers: Sit to/from Stand              Ambulation/Gait Ambulation/Gait assistance: Supervision Ambulation Distance (Feet): 350 Feet Assistive device: None Gait Pattern/deviations: Step-through pattern;Decreased stride length Gait velocity: Decreased   General Gait Details: Pt with 4 standing rest breaks during gait due to leg tightness, no knee buckling or gross deficits with LOB. Pt denied any benefit from use of RW  Stairs Stairs:  (pt denied today)          Wheelchair Mobility    Modified Rankin (Stroke Patients Only)       Balance Overall balance assessment: History of Falls                                           Pertinent Vitals/Pain  Pain Location: legs Pain Descriptors / Indicators: Aching Pain Intervention(s): Repositioned    Home Living Family/patient expects to be discharged to:: Private residence Living Arrangements: Children Available Help at Discharge: Family;Available 24 hours/day Type of Home: House Home Access: Stairs to enter Entrance Stairs-Rails: Right Entrance Stairs-Number of Steps: 6 Home Layout: One level Home Equipment: None      Prior Function Level of Independence: Independent         Comments: No longer drives, 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, newspaper        Extremity/Trunk Assessment   Upper Extremity Assessment: Overall WFL for tasks assessed           Lower Extremity Assessment: Generalized weakness (bil LE with 3+/5: hip flexion, knee flexion and extension)      Cervical / Trunk Assessment: Normal  Communication   Communication: No difficulties  Cognition Arousal/Alertness: Awake/alert Behavior During Therapy: WFL for tasks assessed/performed Overall Cognitive Status: Within Functional Limits for tasks assessed                      General Comments      Exercises General Exercises - Lower Extremity Long Arc Quad: AROM;Seated;Both;10 reps Hip Flexion/Marching: AROM;Seated;Both;5 reps Toe Raises: AROM;Seated;Both;10 reps Heel Raises: AROM;Seated;Both;10 reps  Assessment/Plan    PT Assessment Patient needs continued PT services  PT Diagnosis Difficulty walking;Generalized weakness   PT Problem List Decreased strength;Decreased activity tolerance;Decreased balance;Decreased mobility  PT Treatment Interventions DME instruction;Gait training;Stair training;Functional mobility training;Therapeutic activities;Therapeutic exercise;Neuromuscular re-education;Patient/family education   PT Goals (Current goals can be found in the Care Plan section) Acute Rehab PT Goals Patient Stated Goal: go home PT Goal Formulation: With patient Time For Goal  Achievement: 01/16/14 Potential to Achieve Goals: Fair    Frequency Min 3X/week   Barriers to discharge Decreased caregiver support      Co-evaluation               End of Session   Activity Tolerance: Patient tolerated treatment well Patient left: in chair;with call bell/phone within reach Nurse Communication: Mobility status    Functional Assessment Tool Used: clinical judgement Functional Limitation: Mobility: Walking and moving around Mobility: Walking and Moving Around Current Status ([id number]): At least 1 percent but less than 20 percent impaired, limited or restricted Mobility: Walking and Moving Around Goal Status [phone]): At least 1 percent but less than 20 percent impaired, limited or restricted    Time: 0981-1914 PT Time Calculation (min): 23 min   Charges:   PT Evaluation $Initial PT Evaluation Tier I: 1 Procedure PT Treatments $Gait Training: 8-22 mins   PT G Codes:   Functional Assessment Tool Used: clinical judgement Functional Limitation: Mobility: Walking and moving around    Melford Aase 01/02/2014, 2:45 PM  Elwyn Reach, [place]

## 2014-01-02 NOTE — Progress Notes (Signed)
  Transitional Care Clinic:  This Case Manager reviewed patient's EMR and determined patient may benefit from chronic care management services through the [facility]. Patient has had 10 hospital admissions in the last six months. Patent has a history of coronary artery disease, anemia, chronic kidney disease stage II, diabetes mellitus type II. Patient presented on 01/01/14 with dull chest pain.  This Case Manager met with patient to discuss the services and medical management that can be provided at the [facility].  Patient declines follow-up at the [facility]; he indicates he has a primary care physician and does not need any additional follow-up. Provided Transitional Care Clinic pamphlet in case patient reconsiders or has any questions.  [name]

## 2014-01-02 NOTE — Discharge Instructions (Signed)
Please continue taking your Lasix as this medication keeps your heart failure stable. Also avoid using cocaine as this causes you to have chest pain and contributes to your heart failure. Please keep your appointment with community health and wellness.

## 2014-01-02 NOTE — Progress Notes (Signed)
CRITICAL VALUE ALERT  Critical value received:  Hgb 6.9  Date of notification:  01/02/2014   Time of notification:  05:02  Critical value read back:Yes.    Nurse who received alert:  Janeann Merl, RN  MD notified (1st page):  Dr. Trudee Kuster  Time of first page:  05:05  MD notified (2nd page):  Time of second page:  Responding MD:  Dr. Trudee Kuster  Time MD responded:  05:06  Comment: MD also notified that pt states he has been having dizziness for the past few days. Pt educated to call RN for assistance with ambulating. Pt verbalized understanding.
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
## 2014-01-02 NOTE — Progress Notes (Signed)
Pt complaining of nausea this AM. Pt also states he is having 9/10 chest pain. Pt states his chest pain never goes away. Pt requesting PRN nausea medicine. VSS, 1mg  IV morphine given, AM EKG performed per order. Dr. Trudee Kuster notified. Troponin continues to remain negative. Will continue to monitor. Ronnette Hila, RN

## 2014-01-02 NOTE — Plan of Care (Signed)
Problem: Phase I Progression Outcomes Goal: EF % per last Echo/documented,Core Reminder form on chart Outcome: Completed/Met Date Met:  01/02/14 EF 40-45% per echo on 11/27/13 Goal: Up in chair, BRP Outcome: Completed/Met Date Met:  01/02/14 Pt OOB with 1 assist, pt states he has fallen multiple times at home.

## 2014-01-02 NOTE — Progress Notes (Signed)
Pt seen and examined with Dr. Ethelene Hal and Dr. Redmond Pulling. Please refer to resident note for details.  Pt is a 68 y/o male with PMH of HFpEF, CAD s/p CABG, anemia, CKD stage2, DM, cocaine abuse with multiple recent admissions for cp and volume overload secondary to medication and dietary non compliance. He p/w recurrent CP- dull in nature, 6/10 today but 10/10 on admission. He also noted worsening LE edema with abd distension. Pt states he has been compliant with his meds but also admits to cocaine use 5 days ago. No fevers/chills/ Pt c/o mild SOB, no palpitations, no diaphoresis, no lightheadedness, no syncope, no n/v,no diarrhea. Remaining ROS negative  Exam: Gen: AAO*3, NAD Cardio: RRR, normal heart sounds Lungs: CTA b/l Abd: soft, non tender, BS + Ext: 1 + LE edema  Assessment and Plan: 68 y/o male p/w recurrent CP and mild fluid overload in the setting of cocaine use  CP: - pt with recurrent CP but with recent negative work up - troponins negative, no acute ST changes - No further w/u for now  Acute on chronic diastolic CHF: - s/p lasix 40 mg IV *2 - c/w home meds - Lungs are CTA. No further w/u for now  AKI: - Cr increased to 1.51 likely secondary to diuresis - Will need repeat BMP on outpatient follow up  Anemia: - transfused 1 unit PRBC today with appropriate increase in Hg - Was assessed by GI on last admission - c/w iron supplementation and PPI - Pt with history of upper GI AVM  Dispo: Stable for d/c home today
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
## 2014-01-02 NOTE — H&P (Signed)
Pt seen and examined. I agree with documentation as outlined in resident's note. Please refer to my note for further details

## 2014-01-02 NOTE — Progress Notes (Signed)
Pt continues to refuse heparin administration.

## 2014-01-03 LAB — TYPE AND SCREEN
ABO/RH(D): O POS
ANTIBODY SCREEN: NEGATIVE
Unit division: 0
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
## 2014-01-05 NOTE — Discharge Summary (Signed)
INTERNAL MEDICINE ATTENDING DISCHARGE COSIGN   I evaluated the patient on the day of discharge and discussed the discharge plan with my resident team. I agree with the discharge documentation and disposition.   Kohan Azizi 01/05/2014, 5:03 PM

## 2014-01-20 ENCOUNTER — Other Ambulatory Visit: Payer: Self-pay | Admitting: Internal Medicine

## 2014-02-07 ENCOUNTER — Emergency Department (HOSPITAL_COMMUNITY): Payer: Medicare Other

## 2014-02-07 ENCOUNTER — Inpatient Hospital Stay (HOSPITAL_COMMUNITY)
Admission: EM | Admit: 2014-02-07 | Discharge: 2014-02-09 | DRG: 293 | Disposition: A | Discharge status: Home / Self Care | Payer: Medicare Other | Attending: Internal Medicine | Admitting: Internal Medicine

## 2014-02-07 ENCOUNTER — Inpatient Hospital Stay (HOSPITAL_COMMUNITY): Payer: Medicare Other

## 2014-02-07 ENCOUNTER — Encounter (HOSPITAL_COMMUNITY): Payer: Self-pay | Admitting: Emergency Medicine

## 2014-02-07 DIAGNOSIS — E785 Hyperlipidemia, unspecified: Secondary | ICD-10-CM | POA: Diagnosis present

## 2014-02-07 DIAGNOSIS — F17210 Nicotine dependence, cigarettes, uncomplicated: Secondary | ICD-10-CM | POA: Diagnosis present

## 2014-02-07 DIAGNOSIS — G40909 Epilepsy, unspecified, not intractable, without status epilepticus: Secondary | ICD-10-CM | POA: Diagnosis present

## 2014-02-07 DIAGNOSIS — F1210 Cannabis abuse, uncomplicated: Secondary | ICD-10-CM | POA: Diagnosis present

## 2014-02-07 DIAGNOSIS — I701 Atherosclerosis of renal artery: Secondary | ICD-10-CM | POA: Diagnosis present

## 2014-02-07 DIAGNOSIS — I255 Ischemic cardiomyopathy: Secondary | ICD-10-CM | POA: Diagnosis present

## 2014-02-07 DIAGNOSIS — G4733 Obstructive sleep apnea (adult) (pediatric): Secondary | ICD-10-CM | POA: Diagnosis present

## 2014-02-07 DIAGNOSIS — Z951 Presence of aortocoronary bypass graft: Secondary | ICD-10-CM

## 2014-02-07 DIAGNOSIS — E119 Type 2 diabetes mellitus without complications: Secondary | ICD-10-CM | POA: Diagnosis present

## 2014-02-07 DIAGNOSIS — Z885 Allergy status to narcotic agent status: Secondary | ICD-10-CM

## 2014-02-07 DIAGNOSIS — I2510 Atherosclerotic heart disease of native coronary artery without angina pectoris: Secondary | ICD-10-CM | POA: Diagnosis present

## 2014-02-07 DIAGNOSIS — F1910 Other psychoactive substance abuse, uncomplicated: Secondary | ICD-10-CM | POA: Diagnosis present

## 2014-02-07 DIAGNOSIS — R072 Precordial pain: Secondary | ICD-10-CM

## 2014-02-07 DIAGNOSIS — R0789 Other chest pain: Secondary | ICD-10-CM | POA: Diagnosis not present

## 2014-02-07 DIAGNOSIS — N183 Chronic kidney disease, stage 3 unspecified: Secondary | ICD-10-CM | POA: Diagnosis present

## 2014-02-07 DIAGNOSIS — I10 Essential (primary) hypertension: Secondary | ICD-10-CM | POA: Diagnosis present

## 2014-02-07 DIAGNOSIS — Z8719 Personal history of other diseases of the digestive system: Secondary | ICD-10-CM

## 2014-02-07 DIAGNOSIS — Z5900 Homelessness unspecified: Secondary | ICD-10-CM

## 2014-02-07 DIAGNOSIS — E875 Hyperkalemia: Secondary | ICD-10-CM | POA: Diagnosis present

## 2014-02-07 DIAGNOSIS — I509 Heart failure, unspecified: Secondary | ICD-10-CM

## 2014-02-07 DIAGNOSIS — I5043 Acute on chronic combined systolic (congestive) and diastolic (congestive) heart failure: Secondary | ICD-10-CM | POA: Diagnosis present

## 2014-02-07 DIAGNOSIS — N189 Chronic kidney disease, unspecified: Secondary | ICD-10-CM

## 2014-02-07 DIAGNOSIS — D509 Iron deficiency anemia, unspecified: Secondary | ICD-10-CM | POA: Diagnosis present

## 2014-02-07 DIAGNOSIS — F1410 Cocaine abuse, uncomplicated: Secondary | ICD-10-CM | POA: Diagnosis present

## 2014-02-07 DIAGNOSIS — K31819 Angiodysplasia of stomach and duodenum without bleeding: Secondary | ICD-10-CM

## 2014-02-07 DIAGNOSIS — D631 Anemia in chronic kidney disease: Secondary | ICD-10-CM | POA: Diagnosis present

## 2014-02-07 DIAGNOSIS — J810 Acute pulmonary edema: Secondary | ICD-10-CM

## 2014-02-07 DIAGNOSIS — I129 Hypertensive chronic kidney disease with stage 1 through stage 4 chronic kidney disease, or unspecified chronic kidney disease: Secondary | ICD-10-CM | POA: Diagnosis present

## 2014-02-07 DIAGNOSIS — R079 Chest pain, unspecified: Secondary | ICD-10-CM | POA: Diagnosis present

## 2014-02-07 DIAGNOSIS — Z7982 Long term (current) use of aspirin: Secondary | ICD-10-CM | POA: Diagnosis not present

## 2014-02-07 DIAGNOSIS — R001 Bradycardia, unspecified: Secondary | ICD-10-CM | POA: Diagnosis present

## 2014-02-07 DIAGNOSIS — R0602 Shortness of breath: Secondary | ICD-10-CM

## 2014-02-07 DIAGNOSIS — I739 Peripheral vascular disease, unspecified: Secondary | ICD-10-CM | POA: Diagnosis present

## 2014-02-07 LAB — CBC
HCT: 34.2 % — ABNORMAL LOW (ref 39.0–52.0)
HEMOGLOBIN: 10.3 g/dL — AB (ref 13.0–17.0)
MCH: 25.8 pg — AB (ref 26.0–34.0)
MCHC: 30.1 g/dL (ref 30.0–36.0)
MCV: 85.7 fL (ref 78.0–100.0)
Platelets: 276 10*3/uL (ref 150–400)
RBC: 3.99 MIL/uL — AB (ref 4.22–5.81)
RDW: 20.8 % — ABNORMAL HIGH (ref 11.5–15.5)
WBC: 8.1 10*3/uL (ref 4.0–10.5)

## 2014-02-07 LAB — BASIC METABOLIC PANEL
Anion gap: 19 — ABNORMAL HIGH (ref 5–15)
Anion gap: 16 — ABNORMAL HIGH (ref 5–15)
BUN: 39 mg/dL — AB (ref 6–23)
BUN: 40 mg/dL — ABNORMAL HIGH (ref 6–23)
CALCIUM: 8.8 mg/dL (ref 8.4–10.5)
CALCIUM: 8.8 mg/dL (ref 8.4–10.5)
CO2: 19 meq/L (ref 19–32)
CO2: 22 mEq/L (ref 19–32)
CREATININE: 1.8 mg/dL — AB (ref 0.50–1.35)
Chloride: 97 mEq/L (ref 96–112)
Chloride: 98 mEq/L (ref 96–112)
Creatinine, Ser: 1.97 mg/dL — ABNORMAL HIGH (ref 0.50–1.35)
GFR calc Af Amer: 38 mL/min — ABNORMAL LOW (ref >90)
GFR calc non Af Amer: 33 mL/min — ABNORMAL LOW (ref >90)
GFR, EST AFRICAN AMERICAN: 43 mL/min — AB (ref >90)
GFR, EST NON AFRICAN AMERICAN: 37 mL/min — AB (ref >90)
GLUCOSE: 98 mg/dL (ref 70–99)
Glucose, Bld: 105 mg/dL — ABNORMAL HIGH (ref 70–99)
POTASSIUM: 5.4 meq/L — AB (ref 3.7–5.3)
POTASSIUM: 4.7 meq/L (ref 3.7–5.3)
SODIUM: 135 meq/L — AB (ref 137–147)
Sodium: 136 mEq/L — ABNORMAL LOW (ref 137–147)

## 2014-02-07 LAB — RETICULOCYTES
RBC.: 3.99 MIL/uL — AB (ref 4.22–5.81)
RETIC COUNT ABSOLUTE: 151.6 10*3/uL (ref 19.0–186.0)
RETIC CT PCT: 3.8 % — AB (ref 0.4–3.1)

## 2014-02-07 LAB — RAPID URINE DRUG SCREEN, HOSP PERFORMED
Amphetamines: NOT DETECTED
Barbiturates: NOT DETECTED
Benzodiazepines: NOT DETECTED
Cocaine: POSITIVE — AB
Opiates: NOT DETECTED
Tetrahydrocannabinol: NOT DETECTED

## 2014-02-07 LAB — IRON AND TIBC
Iron: 26 ug/dL — ABNORMAL LOW (ref 42–135)
Saturation Ratios: 7 % — ABNORMAL LOW (ref 20–55)
TIBC: 397 ug/dL (ref 215–435)
UIBC: 371 ug/dL (ref 125–400)

## 2014-02-07 LAB — TROPONIN I: Troponin I: <0.3 ng/mL (ref <0.30)

## 2014-02-07 LAB — PHENYTOIN LEVEL, TOTAL: PHENYTOIN LVL: 5.4 ug/mL — AB (ref 10.0–20.0)

## 2014-02-07 LAB — I-STAT TROPONIN, ED: TROPONIN I, POC: 0.02 ng/mL (ref 0.00–0.08)

## 2014-02-07 LAB — VITAMIN B12: Vitamin B-12: 1191 pg/mL — ABNORMAL HIGH (ref 211–911)

## 2014-02-07 LAB — FOLATE: Folate: >20 ng/mL

## 2014-02-07 LAB — PROTIME-INR
INR: 1.34 (ref 0.00–1.49)
Prothrombin Time: 16.7 seconds — ABNORMAL HIGH (ref 11.6–15.2)

## 2014-02-07 LAB — PRO B NATRIURETIC PEPTIDE: Pro B Natriuretic peptide (BNP): 2648 pg/mL — ABNORMAL HIGH (ref 0–125)

## 2014-02-07 LAB — TSH: TSH: 2.04 u[IU]/mL (ref 0.350–4.500)

## 2014-02-07 LAB — FERRITIN: FERRITIN: 61 ng/mL (ref 22–322)

## 2014-02-07 LAB — MAGNESIUM: Magnesium: 2.1 mg/dL (ref 1.5–2.5)

## 2014-02-07 MED ORDER — SODIUM CHLORIDE 0.9 % IV SOLN: 250.0000 mL | INTRAVENOUS | Status: DC | PRN

## 2014-02-07 MED ORDER — DOCUSATE SODIUM 100 MG PO CAPS
100.0000 mg | ORAL_CAPSULE | Freq: Two times a day (BID) | ORAL | Status: DC
  Administered 2014-02-07 – 2014-02-09 (×4): 100 mg via ORAL
  Filled 2014-02-07 (×6): qty 1

## 2014-02-07 MED ORDER — GABAPENTIN 100 MG PO CAPS
100.0000 mg | ORAL_CAPSULE | Freq: Two times a day (BID) | ORAL | Status: DC
  Administered 2014-02-07 – 2014-02-09 (×5): 100 mg via ORAL
  Filled 2014-02-07 (×6): qty 1

## 2014-02-07 MED ORDER — MORPHINE SULFATE 2 MG/ML IJ SOLN
2.0000 mg | INTRAMUSCULAR | Status: DC | PRN
  Administered 2014-02-07 – 2014-02-08 (×3): 2 mg via INTRAVENOUS
  Filled 2014-02-07 (×3): qty 1

## 2014-02-07 MED ORDER — HEPARIN SODIUM (PORCINE) 5000 UNIT/ML IJ SOLN
5000.0000 [IU] | Freq: Three times a day (TID) | INTRAMUSCULAR | Status: DC
  Administered 2014-02-07 – 2014-02-09 (×6): 5000 [IU] via SUBCUTANEOUS
  Filled 2014-02-07 (×9): qty 1

## 2014-02-07 MED ORDER — CLONIDINE HCL 0.1 MG PO TABS
0.1000 mg | ORAL_TABLET | Freq: Two times a day (BID) | ORAL | Status: DC
  Administered 2014-02-07 – 2014-02-09 (×5): 0.1 mg via ORAL
  Filled 2014-02-07 (×6): qty 1

## 2014-02-07 MED ORDER — PHENYTOIN SODIUM EXTENDED 100 MG PO CAPS
200.0000 mg | ORAL_CAPSULE | Freq: Every day | ORAL | Status: DC
  Administered 2014-02-07 – 2014-02-09 (×3): 200 mg via ORAL
  Filled 2014-02-07 (×3): qty 2

## 2014-02-07 MED ORDER — PANTOPRAZOLE SODIUM 40 MG PO TBEC
40.0000 mg | DELAYED_RELEASE_TABLET | Freq: Two times a day (BID) | ORAL | Status: DC
  Administered 2014-02-07 – 2014-02-09 (×5): 40 mg via ORAL
  Filled 2014-02-07 (×3): qty 1

## 2014-02-07 MED ORDER — PHENYTOIN SODIUM EXTENDED 100 MG PO CAPS
300.0000 mg | ORAL_CAPSULE | Freq: Every day | ORAL | Status: DC
  Administered 2014-02-07 – 2014-02-08 (×2): 300 mg via ORAL
  Filled 2014-02-07 (×4): qty 3

## 2014-02-07 MED ORDER — PRAVASTATIN SODIUM 40 MG PO TABS
40.0000 mg | ORAL_TABLET | Freq: Every day | ORAL | Status: DC
  Administered 2014-02-07 – 2014-02-08 (×2): 40 mg via ORAL
  Filled 2014-02-07 (×3): qty 1

## 2014-02-07 MED ORDER — FUROSEMIDE 10 MG/ML IJ SOLN
60.0000 mg | Freq: Once | INTRAMUSCULAR | Status: AC
  Administered 2014-02-07: 60 mg via INTRAVENOUS
  Filled 2014-02-07: qty 6

## 2014-02-07 MED ORDER — SIMVASTATIN 40 MG PO TABS
40.0000 mg | ORAL_TABLET | Freq: Every evening | ORAL | Status: DC
  Filled 2014-02-07: qty 1

## 2014-02-07 MED ORDER — ASPIRIN EC 81 MG PO TBEC
81.0000 mg | DELAYED_RELEASE_TABLET | Freq: Every day | ORAL | Status: DC
Start: 2014-02-07 — End: 2014-02-07

## 2014-02-07 MED ORDER — ASPIRIN 325 MG PO TABS
325.0000 mg | ORAL_TABLET | ORAL | Status: AC
  Administered 2014-02-07: 325 mg via ORAL
  Filled 2014-02-07: qty 1

## 2014-02-07 MED ORDER — GUAIFENESIN ER 600 MG PO TB12
600.0000 mg | ORAL_TABLET | Freq: Two times a day (BID) | ORAL | Status: DC
  Administered 2014-02-07 – 2014-02-09 (×5): 600 mg via ORAL
  Filled 2014-02-07 (×6): qty 1

## 2014-02-07 MED ORDER — FERROUS SULFATE 325 (65 FE) MG PO TABS
325.0000 mg | ORAL_TABLET | Freq: Every day | ORAL | Status: DC
  Administered 2014-02-07 – 2014-02-09 (×3): 325 mg via ORAL
  Filled 2014-02-07 (×3): qty 1

## 2014-02-07 MED ORDER — SODIUM CHLORIDE 0.9 % IJ SOLN: 3.0000 mL | INTRAMUSCULAR | Status: DC | PRN

## 2014-02-07 MED ORDER — AMLODIPINE BESYLATE 10 MG PO TABS
10.0000 mg | ORAL_TABLET | Freq: Every day | ORAL | Status: DC
  Administered 2014-02-07 – 2014-02-09 (×3): 10 mg via ORAL
  Filled 2014-02-07 (×3): qty 1

## 2014-02-07 MED ORDER — FUROSEMIDE 10 MG/ML IJ SOLN
80.0000 mg | Freq: Two times a day (BID) | INTRAMUSCULAR | Status: DC
  Administered 2014-02-07 – 2014-02-09 (×5): 80 mg via INTRAVENOUS
  Filled 2014-02-07 (×5): qty 8

## 2014-02-07 MED ORDER — CITALOPRAM HYDROBROMIDE 20 MG PO TABS
20.0000 mg | ORAL_TABLET | Freq: Every day | ORAL | Status: DC
  Administered 2014-02-07 – 2014-02-09 (×3): 20 mg via ORAL
  Filled 2014-02-07 (×3): qty 1

## 2014-02-07 MED ORDER — NITROGLYCERIN 0.4 MG SL SUBL
0.4000 mg | SUBLINGUAL_TABLET | SUBLINGUAL | Status: DC | PRN
  Administered 2014-02-07 (×3): 0.4 mg via SUBLINGUAL
  Filled 2014-02-07: qty 1

## 2014-02-07 MED ORDER — ISOSORBIDE MONONITRATE ER 30 MG PO TB24
30.0000 mg | ORAL_TABLET | Freq: Every day | ORAL | Status: DC
  Administered 2014-02-07 – 2014-02-09 (×3): 30 mg via ORAL
  Filled 2014-02-07 (×3): qty 1

## 2014-02-07 MED ORDER — SODIUM CHLORIDE 0.9 % IJ SOLN
3.0000 mL | Freq: Two times a day (BID) | INTRAMUSCULAR | Status: DC
  Administered 2014-02-07 – 2014-02-09 (×5): 3 mL via INTRAVENOUS

## 2014-02-07 MED ORDER — HYDRALAZINE HCL 50 MG PO TABS
75.0000 mg | ORAL_TABLET | Freq: Three times a day (TID) | ORAL | Status: DC
  Administered 2014-02-07 – 2014-02-09 (×7): 75 mg via ORAL
  Filled 2014-02-07 (×9): qty 1

## 2014-02-07 MED ORDER — ASPIRIN EC 81 MG PO TBEC
81.0000 mg | DELAYED_RELEASE_TABLET | Freq: Every day | ORAL | Status: DC
  Administered 2014-02-08 – 2014-02-09 (×2): 81 mg via ORAL
  Filled 2014-02-07 (×2): qty 1

## 2014-02-07 NOTE — ED Notes (Signed)
Attempted report 

## 2014-02-07 NOTE — ED Notes (Signed)
Patient returned from X-ray 

## 2014-02-07 NOTE — ED Provider Notes (Signed)
CSN: [id number]     Arrival date & time 02/07/14  0116 History  This chart was scribed for Charles Rice, MD by Peyton Bottoms, ED Scribe. This patient was seen in room A07C/A07C and the patient's care was started at 1:28 AM.   Chief Complaint  Patient presents with  . Shortness of Breath  . Chest Pain   Patient is a 68 y.o. male presenting with shortness of breath and chest pain. The history is provided by the patient. No language interpreter was used.  Shortness of Breath Associated symptoms: chest pain and cough   Associated symptoms: no abdominal pain, no fever, no headaches, no neck pain, no rash, no vomiting and no wheezing   Chest Pain Associated symptoms: cough and shortness of breath   Associated symptoms: no abdominal pain, no back pain, no dizziness, no fever, no headache, no nausea, no numbness, no palpitations, not vomiting and no weakness     HPI Comments: Charles [name] is a 68 y.o. male with a history of ischemic cardiomyopathy, CAD, hypertension, diabetes, who presents to the Emergency Department complaining of gradually worsening  moderate SOB, productive cough and constant left sided CP which began earlier today. He's had no known new lower extremity swelling. Denies any fever or chills. Chest pain is worse with palpation.  Past Medical History  Diagnosis Date  . Iron deficiency anemia   . Chronic combined systolic and diastolic CHF, NYHA class 2     a. 11/2013 Echo: EF 40-45%, basl-mid inflat AK, Gr 2 DD.  [name] Hyperlipidemia LDL goal <70   . Obstructive sleep apnea     a. not on home CPAP  . Ischemic cardiomyopathy     a. 11/2013 Echo: EF 40-45%, basl-mid inflat AK, Gr 2 DD, mild AI, mod dil LA/RA, mod reduced RV fxn, PASP 45mmHg.  [name] Homelessness   . Moderate to severe pulmonary hypertension 03/2010    a. PA peak pressure of 76 mmHg (per 2D Echo 03/2010)  . Polysubstance abuse     a. cocaine, THC  . AV malformation of gastrointestinal tract     a. w/ h/o  GIB.  [name] Family history of early CAD   . DDD (degenerative disc disease), lumbar   . Peripheral vascular disease   . Seizure disorder   . Hypertension   . CAD (coronary artery disease)     a. s/p 3-vessel CABG (12/2007) // 100% RCA stenosis with collaterals from left system. Severe bifurcation lesions of proxima CXA and OM. Moderate LAD disease - followed by Dr. Wyline Copas in [facility];  b. 11/2013 Myoview: Large fixed inf defect w/o ischemia, EF 35%.  . Diabetes   . Chronic kidney disease (CKD), stage III (moderate)     a. BL SCr 1.5-1.6  . Renal artery stenosis    Past Surgical History  Procedure Laterality Date  . Apc  03/2010    To treat small bowel AVMs  . Esophagogastroduodenoscopy N/A 08/14/2012    Procedure: ESOPHAGOGASTRODUODENOSCOPY (EGD);  Surgeon: [name], MD;  Location: [facility] ENDOSCOPY;  Service: Endoscopy;  Laterality: N/A;  . Hot hemostasis N/A 08/14/2012    Procedure: HOT HEMOSTASIS (ARGON PLASMA COAGULATION/BICAP);  Surgeon: [name], MD;  Location: [facility] ENDOSCOPY;  Service: Endoscopy;  Laterality: N/A;  . Esophagogastroduodenoscopy (egd) with propofol N/A 08/04/2013    Procedure: ESOPHAGOGASTRODUODENOSCOPY (EGD) WITH PROPOFOL;  Surgeon: [name], MD;  Location: [facility] ENDOSCOPY;  Service: Endoscopy;  Laterality: N/A;  . Coronary artery bypass graft  12/2007  Family History  Problem Relation Age of Onset  . Heart disease Mother     unknown type  . Heart disease Father 34    died of MI at 51yo  . Heart disease Paternal Grandfather 82    died of MI  . Heart disease    . Heart disease Brother 30   History  Substance Use Topics  . Smoking status: Current Some Day Smoker -- 0.25 packs/day for 56 years    Types: Cigarettes  . Smokeless tobacco: Never Used  . Alcohol Use: Yes     Comment: occasionally drinks, a few times a month   Review of Systems  Constitutional: Negative for fever and chills.  Respiratory: Positive for cough and shortness of breath. Negative for  wheezing.   Cardiovascular: Positive for chest pain. Negative for palpitations and leg swelling.  Gastrointestinal: Negative for nausea, vomiting, abdominal pain, diarrhea and constipation.  Genitourinary: Negative for dysuria, frequency and flank pain.  Musculoskeletal: Negative for myalgias, back pain, neck pain and neck stiffness.  Skin: Negative for rash and wound.  Neurological: Negative for dizziness, weakness, light-headedness, numbness and headaches.  All other systems reviewed and are negative.  Allergies  Motrin and Tylenol  Home Medications   Prior to Admission medications   Medication Sig Start Date End Date Taking? Authorizing Provider  amLODipine (NORVASC) 10 MG tablet Take 10 mg by mouth daily.   Yes Historical Provider, MD  aspirin EC 81 MG tablet Take 81 mg by mouth daily.   Yes Historical Provider, MD  citalopram (CELEXA) 20 MG tablet Take 20 mg by mouth daily.   Yes Historical Provider, MD  cloNIDine (CATAPRES) 0.2 MG tablet Take 0.2 mg by mouth 2 (two) times daily.   Yes Historical Provider, MD  docusate sodium (COLACE) 100 MG capsule Take 100 mg by mouth 2 (two) times daily.   Yes Historical Provider, MD  ferrous sulfate 325 (65 FE) MG tablet Take 325 mg by mouth daily.    Yes Historical Provider, MD  furosemide (LASIX) 80 MG tablet Take 80 mg by mouth daily.   Yes Historical Provider, MD  gabapentin (NEURONTIN) 100 MG capsule Take 100 mg by mouth 2 (two) times daily.   Yes Historical Provider, MD  hydrALAZINE (APRESOLINE) 50 MG tablet Take 50 mg by mouth 3 (three) times daily.   Yes Historical Provider, MD  isosorbide mononitrate (IMDUR) 30 MG 24 hr tablet Take 30 mg by mouth daily.   Yes Historical Provider, MD  pantoprazole (PROTONIX) 40 MG tablet Take 40 mg by mouth 2 (two) times daily.   Yes Historical Provider, MD  phenytoin (DILANTIN) 100 MG ER capsule Take 200-300 mg by mouth 2 (two) times daily. 200mg  in the morning, 300mg  in the evening   Yes Historical  Provider, MD  simvastatin (ZOCOR) 40 MG tablet Take 40 mg by mouth every evening.   Yes Historical Provider, MD   BP 155/81 mmHg  Pulse 48  Resp 24  SpO2 98% Physical Exam  Constitutional: He is oriented to person, place, and time. He appears well-developed and well-nourished. No distress.  HENT:  Head: Normocephalic and atraumatic.  Mouth/Throat: Oropharynx is clear and moist. No oropharyngeal exudate.  Eyes: EOM are normal. Pupils are equal, round, and reactive to light.  Neck: Normal range of motion. Neck supple.  Cardiovascular: Regular rhythm.   Bradycardia  Pulmonary/Chest: Effort normal and breath sounds normal. No respiratory distress. He has no wheezes. He has no rales. He exhibits tenderness (tenderness reproduced  with palpation of the left chest. There is no crepitance or deformity.).  Abdominal: Soft. Bowel sounds are normal. He exhibits no distension and no mass. There is no tenderness. There is no rebound and no guarding.  Musculoskeletal: Normal range of motion. He exhibits no edema or tenderness.  No calf swelling or tenderness.  Neurological: He is alert and oriented to person, place, and time.  Moves all extremities without deficit. Sensation is grossly intact.  Skin: Skin is warm and dry. No rash noted. No erythema.  Psychiatric: He has a normal mood and affect. His behavior is normal.  Nursing note and vitals reviewed.   ED Course  Procedures (including critical care time)  DIAGNOSTIC STUDIES:    COORDINATION OF CARE: 1:33 AM- Discussed plans to order diagnostic CXR, and lab work. Will give patient aspirin and nitroglycerin. Pt advised of plan for treatment and pt agrees.  Labs Review Labs Reviewed  CBC - Abnormal; Notable for the following:    RBC 3.99 (*)    Hemoglobin 10.3 (*)    HCT 34.2 (*)    MCH 25.8 (*)    RDW 20.8 (*)    All other components within normal limits  BASIC METABOLIC PANEL - Abnormal; Notable for the following:    Sodium 135 (*)     Potassium 5.4 (*)    BUN 39 (*)    Creatinine, Ser 1.97 (*)    GFR calc non Af Amer 33 (*)    GFR calc Af Amer 38 (*)    Anion gap 19 (*)    All other components within normal limits  PRO B NATRIURETIC PEPTIDE - Abnormal; Notable for the following:    Pro B Natriuretic peptide (BNP) 2648.0 (*)    All other components within normal limits  I-STAT TROPOININ, ED   Imaging Review Dg Chest 2 View  02/07/2014   CLINICAL DATA:  Left-sided chest pain with productive cough and shortness of breath. Initial encounter.  EXAM: CHEST  2 VIEW  COMPARISON:  01/01/2014.  FINDINGS: There is stable cardiomegaly status post median sternotomy and CABG. There is progressive prominence of the interstitial markings with fissural thickening and possible mild edema. There is no confluent airspace opacity or significant pleural effusion. The bones appear unchanged.  IMPRESSION: Progressive interstitial prominence and fissural thickening suspicious for mild pulmonary edema. Stable cardiomegaly status post CABG. No evidence of pneumonia.   Electronically Signed   By: Camie Patience M.D.   On: 02/07/2014 02:52     EKG Interpretation None      Date: 02/07/2014  Rate: 50  Rhythm: sinus bradycardia  QRS Axis: normal  Intervals: normal  ST/T Wave abnormalities: nonspecific T wave changes  Conduction Disutrbances:none  Narrative Interpretation:   Old EKG Reviewed: unchanged   MDM   Final diagnoses:  Chest wall pain  CHF exacerbation      I personally performed the services described in this documentation, which was scribed in my presence. The recorded information has been reviewed and is accurate.  Pulmonary edema on chest x-ray. Chest pain appears to be related to the chest wall. The previous admissions this appears to be chronic. No acute EKG changes. Initial troponin is normal. Discussed with [facility] hospitalists and will admit to telemetry bed.  Charles Rice, MD 02/07/14 [phone]

## 2014-02-07 NOTE — Progress Notes (Signed)
PROGRESS NOTE    Charles [name] XNT:[id number] DOB: May 12, 1945 DOA: 02/07/2014 PCP: Charles [name]  HPI/Brief narrative 68 year old male with history of CAD, CABG, ischemic cardiomyopathy, LVEF 40-45% in September 2015, most recent nuclear stress test result September 2015 negative for ischemia, OSA not on C Pap, GI bleed, DM, PAD, chronic kidney disease, ongoing cocaine abuse, seen earlier on day of admission at [facility] ER and signed out AMA, presented to [facility] ED with ongoing chest pain and dyspnea.   Assessment/Plan:  1. Atypical chest pain: EKG shows sinus bradycardia without acute changes. Troponin 2 negative. Continue aspirin, nitrates and Zocor changed to pravastatin due to being on amlodipine. Cardiology consultation appreciated-recommend no further workup if troponin negative. Patient counseled extensively regarding cocaine cessation. 2. Mild acute on chronic combined CHF: May be the cause of his atypical chest pain. Continue IV Lasix for additional 24 hours then transitioned to home dose diuretics. Strict intake and output. 3. Cocaine abuse: Cessation counseled. Admitted using cocaine 4 days back. 4. Stage III chronic kidney disease: Baseline creatinine not clear but may be in the 1.5 range. Monitor closely while on diuresis. Not candidate for ACEI/ARB secondary to kidney disease. 5. Hyperkalemia: Resolved after diuresis. Monitor. 6. Anemia: May be from chronic kidney disease. Hemoglobin has improved compared to October. 7. Essential hypertension: Mildly uncontrolled. Continue amlodipine and clonidine - dose has been reduced from 0.2 to 0.1 MG twice a day.  8. Sinus bradycardia: Likely from clonidine. Asymptomatic. Monitor. 9. History of seizures: States that he has not had a seizure in 3 years. Continue phenytoin.     Code Status: Full  Family Communication: None at bedside  Disposition Plan: Home when medically  stable  Consultants:  Cardiology   Procedures:  None   Antibiotics:  None   Subjective: States that chest pain and dyspnea are better than admission. States that he has had some weight gain lately but claims compliance to low salt diet and medications.   Objective: Filed Vitals:   02/07/14 0345 02/07/14 0545 02/07/14 0645 02/07/14 0717  BP: 153/73 155/81 150/78 166/85  Pulse: 46 48 47 50  Temp:    98 F (36.7 C)  TempSrc:    Oral  Resp: 24 24 22 20   Weight:    84.324 kg (185 lb 14.4 oz)  SpO2: 100% 98% 100% 95%    Intake/Output Summary (Last 24 hours) at 02/07/14 1249 Last data filed at 02/07/14 0615  Gross per 24 hour  Intake      0 ml  Output    300 ml  Net   -300 ml   Filed Weights   02/07/14 0717  Weight: 84.324 kg (185 lb 14.4 oz)     Exam:  General exam: Pleasant middle-aged male lying comfortably propped up in bed without distress  Respiratory system:Few fine basal crackles otherwise clear to auscultation. No increased work of breathing. Cardiovascular system: S1 & S2 heard,regular and bradycardic.1+ pitting bilateral leg edema. JVD +. No murmurs or clicks. Telemetry: Sinus bradycardia in the mid 40s.  Gastrointestinal system: Abdomen is nondistended, soft and nontender. Normal bowel sounds heard. Central nervous system: Alert and oriented. No focal neurological deficits. Extremities: Symmetric 5 x 5 power.   Data Reviewed: Basic Metabolic Panel:  Recent Labs Lab 02/07/14 0123 02/07/14 1142  NA 135* 136*  K 5.4* 4.7  CL 97 98  CO2 19 22  GLUCOSE 98 105*  BUN 39* 40*  CREATININE 1.97* 1.80*  CALCIUM 8.8  8.8   Liver Function Tests: No results for input(s): AST, ALT, ALKPHOS, BILITOT, PROT, ALBUMIN in the last 168 hours. No results for input(s): LIPASE, AMYLASE in the last 168 hours. No results for input(s): AMMONIA in the last 168 hours. CBC:  Recent Labs Lab 02/07/14 0123  WBC 8.1  HGB 10.3*  HCT 34.2*  MCV 85.7  PLT 276    Cardiac Enzymes:  Recent Labs Lab 02/07/14 0956 02/07/14 1142  TROPONINI <0.30 <0.30   BNP (last 3 results)  Recent Labs  12/20/13 1032 01/01/14 1107 02/07/14 0123  PROBNP 5934.0* 3225.0* 2648.0*   CBG: No results for input(s): GLUCAP in the last 168 hours.  No results found for this or any previous visit (from the past 240 hour(s)).     Studies: Dg Chest 2 View  02/07/2014   CLINICAL DATA:  Left-sided chest pain with productive cough and shortness of breath. Initial encounter.  EXAM: CHEST  2 VIEW  COMPARISON:  01/01/2014.  FINDINGS: There is stable cardiomegaly status post median sternotomy and CABG. There is progressive prominence of the interstitial markings with fissural thickening and possible mild edema. There is no confluent airspace opacity or significant pleural effusion. The bones appear unchanged.  IMPRESSION: Progressive interstitial prominence and fissural thickening suspicious for mild pulmonary edema. Stable cardiomegaly status post CABG. No evidence of pneumonia.   Electronically Signed   By: Charles [name] M.D.   On: 02/07/2014 02:52        Scheduled Meds: . amLODipine  10 mg Oral Daily  . [START ON 02/08/2014] aspirin EC  81 mg Oral Daily  . citalopram  20 mg Oral Daily  . cloNIDine  0.1 mg Oral BID  . docusate sodium  100 mg Oral BID  . ferrous sulfate  325 mg Oral Daily  . furosemide  80 mg Intravenous BID  . gabapentin  100 mg Oral BID  . guaiFENesin  600 mg Oral BID  . heparin  5,000 Units Subcutaneous 3 times per day  . hydrALAZINE  75 mg Oral TID  . isosorbide mononitrate  30 mg Oral Daily  . pantoprazole  40 mg Oral BID  . phenytoin  200 mg Oral Daily  . phenytoin  300 mg Oral QHS  . simvastatin  40 mg Oral QPM  . sodium chloride  3 mL Intravenous Q12H   Continuous Infusions:   Principal Problem:   Acute on chronic combined systolic and diastolic heart failure Active Problems:   Iron deficiency anemia   Hypertension   CAD- CABG  x3 '09. Myoview no ischemia 11/27/13   Chronic kidney disease (CKD), stage III (moderate)   Homelessness   Polysubstance abuse   Peripheral vascular disease   Seizure disorder   Bradycardia   Gastric AVM   History of GI bleed   Chest pain   CHF exacerbation    Time spent: 40 minutes.    Charles Leep, MD, FACP, FHM. [facility] Pager [phone]  If 7PM-7AM, please contact night-coverage [email or web address] Password [email or web address] 02/07/2014, 12:49 PM    LOS: 0 days

## 2014-02-07 NOTE — Consult Note (Signed)
CARDIOLOGY CONSULT NOTE   Patient ID: Charles [name] MRN: [id number] DOB/AGE: 70-Jun-1947 70 y.o.  Admit Date: 02/07/2014 Referring Physician: PTH-Hongalgi  Primary Physician: [name] Consulting Cardiologist: Braulio Bosch MD Primary Cardiologist: [facility]  Reason for Consultation: Chest Pain with CHF, Cocaine abuse  Clinical Summary [name] is a 68 y.o.male with known history of coronary artery disease, coronary artery bypass grafting in 2009, ischemic cardiomyopathy with most recent echocardiogram in September 2015 revealing an EF of 40-45%, most recent nuclear medicine study September 2015, negative for ischemia, who presented to the emergency room after being seen earlier that day at [facility] emergency room, signing out [facility]. The patient has a past medical history of substance abuse to include cocaine , obstructive sleep apnea, GI bleeding , peripheral vascular disease and chronic kidney disease.   He states the pain began while at rest watching television. 2 days ago, described as sharp pain, unrelenting lasting for several hours . Did not take anything to make it better. He had no associated nausea, vomiting or diaphoresis. He states by the time it was evening he became more concerned about pain, which he described as sharp on the left sided, also began to have sharp pain in his abdomen without associated nausea, vomiting or diarrhea. He was brought to ER at [facility] at his request by his nephew.  On evaluation in the emergency room he was found to be bradycardic with a heart rate of 56 bpm, blood pressure 147/83, O2 sat 100% on 2 L. Cardiac enzyme was found to be negative. ProBNP elevated at 2648, chest x-ray revealing mild pulmonary edema. EKG was negative for acute ST-T wave changes, mild repolarization abnormality. He was found to be mildly hyperkalemic with a potassium of 5.4, creatinine 1.97, sodium 135. CBC and urine drug screen had  not been performed. Nor has TSH. He was treated with aspirin and nitroglycerin sublingual, and given Lasix 60 mg IV 1. Urine output has been recorded at 300 cc so far. [name] He denies medical noncompliance, stating that he receives his medications were free from [place], primary care physician, Dr. Alroy Dust. He states that he is feeling better, although he continues to have chest pain. It is "not as bad." And has no further abdominal discomfort.   All ergies  Allergen Reactions  . Motrin [Ibuprofen] Other (See Comments)    Affects kidneys  . Tylenol [Acetaminophen] Other (See Comments)    Affects kidneys    Medications Scheduled Medications: . amLODipine  10 mg Oral Daily  . [START ON 02/08/2014] aspirin EC  81 mg Oral Daily  . citalopram  20 mg Oral Daily  . cloNIDine  0.1 mg Oral BID  . docusate sodium  100 mg Oral BID  . ferrous sulfate  325 mg Oral Daily  . furosemide  80 mg Intravenous BID  . gabapentin  100 mg Oral BID  . guaiFENesin  600 mg Oral BID  . heparin  5,000 Units Subcutaneous 3 times per day  . hydrALAZINE  75 mg Oral TID  . isosorbide mononitrate  30 mg Oral Daily  . pantoprazole  40 mg Oral BID  . phenytoin  200 mg Oral Daily  . phenytoin  300 mg Oral QHS  . simvastatin  40 mg Oral QPM  . sodium chloride  3 mL Intravenous Q12H    Infusions:    PRN Medications: sodium chloride, morphine injection, nitroGLYCERIN, sodium chloride   Past Medical History  Diagnosis  Date  . Iron deficiency anemia   . Chronic combined systolic and diastolic CHF, NYHA class 2     a. 11/2013 Echo: EF 40-45%, basl-mid inflat AK, Gr 2 DD.  [name] Hyperlipidemia LDL goal <70   . Obstructive sleep apnea     a. not on home CPAP  . Ischemic cardiomyopathy     a. 11/2013 Echo: EF 40-45%, basl-mid inflat AK, Gr 2 DD, mild AI, mod dil LA/RA, mod reduced RV fxn, PASP 48mmHg.  [name] Homelessness   . Moderate to severe pulmonary hypertension 03/2010    a. PA peak pressure of 76 mmHg (per 2D  Echo 03/2010)  . Polysubstance abuse     a. cocaine, THC  . AV malformation of gastrointestinal tract     a. w/ h/o GIB.  [name] Family history of early CAD   . DDD (degenerative disc disease), lumbar   . Peripheral vascular disease   . Seizure disorder   . Hypertension   . CAD (coronary artery disease)     a. s/p 3-vessel CABG (12/2007) // 100% RCA stenosis with collaterals from left system. Severe bifurcation lesions of proxima CXA and OM. Moderate LAD disease - followed by Dr. Wyline Copas in [facility];  b. 11/2013 Myoview: Large fixed inf defect w/o ischemia, EF 35%.  . Diabetes   . Chronic kidney disease (CKD), stage III (moderate)     a. BL SCr 1.5-1.6  . Renal artery stenosis     Past Surgical History  Procedure Laterality Date  . Apc  03/2010    To treat small bowel AVMs  . Esophagogastroduodenoscopy N/A 08/14/2012    Procedure: ESOPHAGOGASTRODUODENOSCOPY (EGD);  Surgeon: [name], MD;  Location: [facility] ENDOSCOPY;  Service: Endoscopy;  Laterality: N/A;  . Hot hemostasis N/A 08/14/2012    Procedure: HOT HEMOSTASIS (ARGON PLASMA COAGULATION/BICAP);  Surgeon: [name], MD;  Location: [facility] ENDOSCOPY;  Service: Endoscopy;  Laterality: N/A;  . Esophagogastroduodenoscopy (egd) with propofol N/A 08/04/2013    Procedure: ESOPHAGOGASTRODUODENOSCOPY (EGD) WITH PROPOFOL;  Surgeon: [name], MD;  Location: [facility] ENDOSCOPY;  Service: Endoscopy;  Laterality: N/A;  . Coronary artery bypass graft  12/2007    Family History  Problem Relation Age of Onset  . Heart disease Mother     unknown type  . Heart disease Father 9    died of MI at 35yo  . Heart disease Paternal Grandfather 68    died of MI  . Heart disease    . Heart disease Brother 15    Social History [name] reports that he has been smoking Cigarettes.  He has a 14 pack-year smoking history. He has never used smokeless tobacco. [name] reports that he drinks alcohol.  Review of Systems Complete review of systems are found  to be negative unless outlined in H&P above.  Physical Examination Blood pressure 166/85, pulse 50, temperature 98 F (36.7 C), temperature source Oral, resp. rate 20, weight 185 lb 14.4 oz (84.324 kg), SpO2 95 %.  Intake/Output Summary (Last 24 hours) at 02/07/14 1115 Last data filed at 02/07/14 0615  Gross per 24 hour  Intake      0 ml  Output    300 ml  Net   -300 ml    Telemetry: NSR bradycardia in the 40's and 50's. .   GEN: Agitated HEENT: Conjunctiva and lids normal, oropharynx clear with moist mucosa. Neck: Supple, no elevated JVP or carotid bruits, no thyromegaly. Lungs: Clear to auscultation, nonlabored breathing at rest. Cardiac:  Regular rate and rhythm, no S3 or significant systolic murmur, no pericardial rub. Abdomen: Soft, nontender, no hepatomegaly, bowel sounds present, no guarding or rebound. Extremities: No pitting edema, distal pulses 2+. Skin: Warm and dry. Musculoskeletal: No kyphosis. Neuropsychiatric: Alert and oriented x3, affect grossly appropriate.  Prior Cardiac Testing/Procedures IMPRESSION: NM 11/29/2013 1. No reversible ischemia. Fixed large inferior wall defect. 2. Left ventricular dilatation with decreased inferior wall motion. 3. Left ventricular ejection fraction 35% 4. High -risk stress test findings  Echocardiogram: 11/27/2013 Left ventricle: The cavity size was mildly dilated. Wall thickness was normal. Systolic function was mildly to moderately reduced. The estimated ejection fraction was in the range of 40% to 45%. There is akinesis of the basal-midinferolateral myocardium. Features are consistent with a pseudonormal left ventricular filling pattern, with concomitant abnormal relaxation and increased filling pressure (grade 2 diastolic dysfunction). Doppler parameters are consistent with high ventricular filling pressure. - Aortic valve: There was mild regurgitation. - Mitral valve: Calcified annulus. - Left atrium:  The atrium was moderately dilated. - Right ventricle: The cavity size was mildly dilated. Systolic function was moderately reduced. - Right atrium: The atrium was mildly dilated. - Pulmonary arteries: Systolic pressure was mildly increased. PA peak pressure: 43 mm Hg (S).  Lab Results  Basic Metabolic Panel:  Recent Labs Lab 02/07/14 0123  NA 135*  K 5.4*  CL 97  CO2 19  GLUCOSE 98  BUN 39*  CREATININE 1.97*  CALCIUM 8.8    Liver Function Tests: No results for input(s): AST, ALT, ALKPHOS, BILITOT, PROT, ALBUMIN in the last 168 hours.  CBC:  Recent Labs Lab 02/07/14 0123  WBC 8.1  HGB 10.3*  HCT 34.2*  MCV 85.7  PLT 276    Radiology: Dg Chest 2 View  02/07/2014   CLINICAL DATA:  Left-sided chest pain with productive cough and shortness of breath. Initial encounter.  EXAM: CHEST  2 VIEW  COMPARISON:  01/01/2014.  FINDINGS: There is stable cardiomegaly status post median sternotomy and CABG. There is progressive prominence of the interstitial markings with fissural thickening and possible mild edema. There is no confluent airspace opacity or significant pleural effusion. The bones appear unchanged.  IMPRESSION: Progressive interstitial prominence and fissural thickening suspicious for mild pulmonary edema. Stable cardiomegaly status post CABG. No evidence of pneumonia.   Electronically Signed   By: Camie Patience M.D.   On: 02/07/2014 02:52     ECG: NSR. Bradycardia with intraventricular conduction delay and early repolarization abnormalities.   Impression and Recommendations  1.Atyical Chest Pain: Described as sharp, unrelenting, lasting at least 12 hours, with no associated shortness of breath, diaphoresis, dizziness, or weakness. He also had some sharp pain in his abdomen. He stated that he had some mild edema in his feet. Prior to coming in. He was seen at [facility] ER , but left AMA and came to [facility] yesterday.   Cardiac enzymes are  negative 2. There are no acute ST-T wave changes, arguing against ACS at this time. He is feeling better with diuresis and nitroglycerin. Doubt need to repeat ischemic testing at present.   Continue current medications.   2. Pulmonary Edema: Has been treated with IV Lasix with improvement in symptoms. He denied any dyspnea however. He mentioned some lower extremity edema, which is essentially resolved on examination, with minimal 1+ edema noted. Lungs are essentially clear. Likely related to decreased EF and questionable dietary compliance.  3. Bradycardia: It was noted that he was bradycardic on  admission and has been since on clonidine. No BB with hx of cocaine.   4.Cocaine abuse: He denies use to me, but has admitted to Dr. Stanford Breed that he used crack cocaine 4 days ago.     Signed: Phill Myron. Lawrence NP Mona  02/07/2014, 11:15 AM Co-Sign MD  As above, patient seen and examined. Briefly he is a 68 year old male with a past medical history of coronary artery disease status post coronary artery bypass graft, ischemic cardiomyopathy, renal insufficiency, substance abuse, hyperlipidemia, Diabetes mellitus for evaluation of chest pain. Patient has had multiple admissions. Most recent nuclear study in September 2015 showed inferior infarct and no ischemia. Ejection fraction 35%. Echocardiogram September 2015 showed ejection fraction 81-10%, grade 2 diastolic dysfunction, moderate left atrial enlargement and mild aortic insufficiency. Mild right atrial enlargement and right ventricular enlargement. Patient developed chest pain yesterday morning. It is in the left chest area and described as a sharp pain. It has been continuous. Not pleuritic, positional or exertional. No relieving factors. He also complains of shortness of breath where "I can't get enough air". Not related to activities and no orthopnea. Electrocardiogram shows sinus bradycardia with nonspecific ST changes. Initial enzymes negative.  Chest pain is extremely atypical. It has been continuous for greater than 24 hours and initial enzymes negative. Follow-up enzymes negative would not pursue further cardiac workup. Continue preadmission cardiac medications including preadmission dose of Lasix. He is not on a beta blocker because of cocaine use. Not on an ACE inhibitor presumably because of renal insufficiency. Following discharge he will need close follow-up with his cardiologist in [facility]. I discussed the importance of avoiding cocaine. Given potential interaction between amlodipine and Zocor were discontinued Zocor and treatment with Pravachol 40 mg daily at bedtime. Please call with questions. Kirk Ruths

## 2014-02-07 NOTE — ED Notes (Signed)
Transporting patient to new room assignment. 

## 2014-02-07 NOTE — ED Notes (Signed)
Pt. reports progressing SOB with left chest tightness /productive cough onset this evening . Denies nausea or diaphoresis .

## 2014-02-07 NOTE — ED Notes (Signed)
Pt c/o left-sided chest pain, productive cough, shortness of breath, dizziness, falls d/t weakness - pt admits he was recently admitted to the hospital d/t anemia and received blood transfusions at that time. Pt noted to be in a sinus brady cardiac rhythm on the monitor - BP stable and pt remains A&Ox4.

## 2014-02-07 NOTE — H&P (Signed)
[facility] History and Physical  Charles [name] XTG:[id number] DOB: 05/16/1945 DOA: 02/07/2014  Referring physician: ED physician PCP: [name]  Specialists:   Chief Complaint: Chest pain, shortness of breath and cough.  HPI: Charles [name] is a 68 y.o. male with past medical history of seizure disorder, congestive heart failure, coronary artery disease (s/p of CABG 2009), ckd-III, hyperlipidemia, obstructive sleep apnea, history of GI bleeding due to AVM, peripheral vascular disease, who presented with chest pain, cough and shortness of breath.  Patient reports that he started having chest pain at about 10 PM. It is located in the left side of his chest, constant, 10 out of 10 in severity, sharp, nonradiating. The chest pain is not pleuritic, deep breath does not make the chest pain worse. He feels very weak. He has mild cough with thick white sputum production. He does not have fever or chills. He also has nausea, and reports "vomitted phlem".  He said he missed all his medications for one day, except for Lasix. His body weight increased by 4 pounds recently per pt.   Work up in the ED demonstrates negative troponin; elevated proBNP 2648; pulmonary edema on chest x-ray; worsening renal function; no leukocytosis. He is admitted to inpatient for further evaluation and treatment.  Review of Systems: As presented in the history of presenting illness, rest negative.  Where does patient live?  At daughter's home Can patient participate in ADLs? little  Allergy:  Allergies  Allergen Reactions  . Motrin [Ibuprofen] Other (See Comments)    Affects kidneys  . Tylenol [Acetaminophen] Other (See Comments)    Affects kidneys    Past Medical History  Diagnosis Date  . Iron deficiency anemia   . Chronic combined systolic and diastolic CHF, NYHA class 2     a. 11/2013 Echo: EF 40-45%, basl-mid inflat AK, Gr 2 DD.  [name] Hyperlipidemia LDL goal <70   . Obstructive sleep apnea      a. not on home CPAP  . Ischemic cardiomyopathy     a. 11/2013 Echo: EF 40-45%, basl-mid inflat AK, Gr 2 DD, mild AI, mod dil LA/RA, mod reduced RV fxn, PASP 68mmHg.  [name] Homelessness   . Moderate to severe pulmonary hypertension 03/2010    a. PA peak pressure of 76 mmHg (per 2D Echo 03/2010)  . Polysubstance abuse     a. cocaine, THC  . AV malformation of gastrointestinal tract     a. w/ h/o GIB.  [name] Family history of early CAD   . DDD (degenerative disc disease), lumbar   . Peripheral vascular disease   . Seizure disorder   . Hypertension   . CAD (coronary artery disease)     a. s/p 3-vessel CABG (12/2007) // 100% RCA stenosis with collaterals from left system. Severe bifurcation lesions of proxima CXA and OM. Moderate LAD disease - followed by Dr. Wyline Copas in [facility];  b. 11/2013 Myoview: Large fixed inf defect w/o ischemia, EF 35%.  . Diabetes   . Chronic kidney disease (CKD), stage III (moderate)     a. BL SCr 1.5-1.6  . Renal artery stenosis     Past Surgical History  Procedure Laterality Date  . Apc  03/2010    To treat small bowel AVMs  . Esophagogastroduodenoscopy N/A 08/14/2012    Procedure: ESOPHAGOGASTRODUODENOSCOPY (EGD);  Surgeon: [name], MD;  Location: [facility] ENDOSCOPY;  Service: Endoscopy;  Laterality: N/A;  . Hot hemostasis N/A 08/14/2012    Procedure: HOT HEMOSTASIS (ARGON PLASMA  COAGULATION/BICAP);  Surgeon: [name], MD;  Location: [facility] ENDOSCOPY;  Service: Endoscopy;  Laterality: N/A;  . Esophagogastroduodenoscopy (egd) with propofol N/A 08/04/2013    Procedure: ESOPHAGOGASTRODUODENOSCOPY (EGD) WITH PROPOFOL;  Surgeon: [name], MD;  Location: [facility] ENDOSCOPY;  Service: Endoscopy;  Laterality: N/A;  . Coronary artery bypass graft  12/2007    Social History:  reports that he has been smoking Cigarettes.  He has a 14 pack-year smoking history. He has never used smokeless tobacco. He reports that he drinks alcohol. He reports that he uses illicit drugs (Cocaine  and Marijuana) about once per week.  Family History:  Family History  Problem Relation Age of Onset  . Heart disease Mother     unknown type  . Heart disease Father 28    died of MI at 53yo  . Heart disease Paternal Grandfather 35    died of MI  . Heart disease    . Heart disease Brother 30     Prior to Admission medications   Medication Sig Start Date End Date Taking? Authorizing Provider  amLODipine (NORVASC) 10 MG tablet Take 10 mg by mouth daily.   Yes Historical Provider, MD  aspirin EC 81 MG tablet Take 81 mg by mouth daily.   Yes Historical Provider, MD  citalopram (CELEXA) 20 MG tablet Take 20 mg by mouth daily.   Yes Historical Provider, MD  cloNIDine (CATAPRES) 0.2 MG tablet Take 0.2 mg by mouth 2 (two) times daily.   Yes Historical Provider, MD  docusate sodium (COLACE) 100 MG capsule Take 100 mg by mouth 2 (two) times daily.   Yes Historical Provider, MD  ferrous sulfate 325 (65 FE) MG tablet Take 325 mg by mouth daily.    Yes Historical Provider, MD  furosemide (LASIX) 80 MG tablet Take 80 mg by mouth daily.   Yes Historical Provider, MD  gabapentin (NEURONTIN) 100 MG capsule Take 100 mg by mouth 2 (two) times daily.   Yes Historical Provider, MD  hydrALAZINE (APRESOLINE) 50 MG tablet Take 50 mg by mouth 3 (three) times daily.   Yes Historical Provider, MD  isosorbide mononitrate (IMDUR) 30 MG 24 hr tablet Take 30 mg by mouth daily.   Yes Historical Provider, MD  pantoprazole (PROTONIX) 40 MG tablet Take 40 mg by mouth 2 (two) times daily.   Yes Historical Provider, MD  phenytoin (DILANTIN) 100 MG ER capsule Take 200-300 mg by mouth 2 (two) times daily. 200mg  in the morning, 300mg  in the evening   Yes Historical Provider, MD  simvastatin (ZOCOR) 40 MG tablet Take 40 mg by mouth every evening.   Yes Historical Provider, MD    Physical Exam: Filed Vitals:   02/07/14 0239 02/07/14 0345 02/07/14 0545 02/07/14 0645  BP: 147/81 153/73 155/81 150/78  Pulse: 56 46 48 47   Resp: 20 24 24 22   SpO2: 100% 100% 98% 100%   General: Not in acute distress HEENT:       Eyes: PERRL, EOMI, no scleral icterus       ENT: No discharge from the ears and nose, no pharynx injection, no tonsillar enlargement.        Neck: Difficult to assess JVD due to body habitus. no bruit, no mass felt. Cardiac: S1/S2, RRR, No murmurs, No gallops or rubs Pulm: Decreased air movement bilaterally, fine crackles bilaterally  Abd: Soft, nondistended, nontender, no rebound pain, no organomegaly, BS present Ext: 2+ pitting leg edema bilaterally. 2+DP/PT pulse bilaterally Musculoskeletal: No joint deformities, erythema,  or stiffness, ROM full Skin: No rashes.  Neuro: Alert and oriented X3, cranial nerves II-XII grossly intact, muscle strength 5/5 in all extremeties, sensation to light touch intact. Moves all extremities. Psych: Patient is not psychotic, no suicidal or hemocidal ideation.  Labs on Admission:  Basic Metabolic Panel:  Recent Labs Lab 02/07/14 0123  NA 135*  K 5.4*  CL 97  CO2 19  GLUCOSE 98  BUN 39*  CREATININE 1.97*  CALCIUM 8.8   Liver Function Tests: No results for input(s): AST, ALT, ALKPHOS, BILITOT, PROT, ALBUMIN in the last 168 hours. No results for input(s): LIPASE, AMYLASE in the last 168 hours. No results for input(s): AMMONIA in the last 168 hours. CBC:  Recent Labs Lab 02/07/14 0123  WBC 8.1  HGB 10.3*  HCT 34.2*  MCV 85.7  PLT 276   Cardiac Enzymes: No results for input(s): CKTOTAL, CKMB, CKMBINDEX, TROPONINI in the last 168 hours.  BNP (last 3 results)  Recent Labs  12/20/13 1032 01/01/14 1107 02/07/14 0123  PROBNP 5934.0* 3225.0* 2648.0*   CBG: No results for input(s): GLUCAP in the last 168 hours.  Radiological Exams on Admission: Dg Chest 2 View  02/07/2014   CLINICAL DATA:  Left-sided chest pain with productive cough and shortness of breath. Initial encounter.  EXAM: CHEST  2 VIEW  COMPARISON:  01/01/2014.  FINDINGS: There  is stable cardiomegaly status post median sternotomy and CABG. There is progressive prominence of the interstitial markings with fissural thickening and possible mild edema. There is no confluent airspace opacity or significant pleural effusion. The bones appear unchanged.  IMPRESSION: Progressive interstitial prominence and fissural thickening suspicious for mild pulmonary edema. Stable cardiomegaly status post CABG. No evidence of pneumonia.   Electronically Signed   By: Camie Patience M.D.   On: 02/07/2014 02:52    EKG: Independently reviewed.   Assessment/Plan Principal Problem:   Acute on chronic combined systolic and diastolic heart failure Active Problems:   Iron deficiency anemia   Hypertension   CAD- CABG x3 '09. Myoview no ischemia 11/27/13   Chronic kidney disease (CKD), stage III (moderate)   Homelessness   Polysubstance abuse   Peripheral vascular disease   Seizure disorder   Bradycardia   Gastric AVM   History of GI bleed   Chest pain   CHF exacerbation  Congestive heart failure exacerbation: 2-D echo on 11/27/13 showed EF of 40-45% with grade 2 diastolic dysfunction. On this admission, his pro BNP is elevated at 2649. 2+ pitting leg edema. Chest x-ray shows pulmonary edema. All these findings are consistent with congestive heart failure exacerbation. Pneumonia is unlikely given no leukocytosis, fever or chills, lack of infiltration on chest x-ray. Pulmonary embolism is another differential diagnosis. Though cannot completely rule out PE, it is less likely given that patient does not have pleuritic chest pain, no tachycardia (actually patient has bradycardia), no signs of DVT.   -will admit to tele bed -treat pt as congestive heart failure exacerbation -will treat with IV lasix 80 mg  Bid -will continue home ASA, amlodipine, clonidine, hydralazine -strict In/Out -Daily body weight. -BMP and Mg level  Chest pain: Etiology is not clear. Given his significant risk factors, it  is important to rule out ACS. Patient has history of CAD, s/p of CABG 2009. -will cycle CE X3 -repeat EKG  -as above, low suspicions for pulmonary embolism, but if the patient's shortness of breath persists after good diuresis, may consider to do workup to rule out pulmonary embolism. Currently  patient's renal function does not allow to have a CTA. VQ scan is also not good choice given pulmonary edema and X-ray. - Aspirin, nitroglycerin when necessary, Zocor, imdur, morphine  CKD-III: Creatinine increased from baseline 1.07 to recent 1.51 on 01/02/14, and further increased to 1.97 today. It is most likely due to worsening congestive heart failure. -will treat congestive heart failure with IV Lasix, with understanding that this may worsen her renal function. However, I am hoping this will not happen due to the Starling mechanism. Her kidney may be better perfused with improved cardiac function. - follow up renal fx by bmp - check renal US  Seizure disorder: Stable. On phenytoin at home -Continue phenytoin and check its level  HTN and bradycardia: Patient is on amlodipine, clonidine, hydralazine, Lasix at home. He has bradycardia. I will make some adjustment to his medications. -Decreased clonidine from 0.2 to 0.1 mg twice a day -Increase his hydralazine from 50-75 mg 3 times a day -Continue amlodipine    DVT ppx: SQ Heparin    Code Status: Full code Family Communication: None at bed side.     Disposition Plan: Admit to inpatient   Date of Service 02/07/2014    Ivor Costa [facility] Pager [phone]  If 7PM-7AM, please contact night-coverage [email or web address] Password [facility] 02/07/2014, 6:51 AM

## 2014-02-07 NOTE — ED Notes (Signed)
Pain reported to MD

## 2014-02-08 LAB — CBC
HEMATOCRIT: 32.1 % — AB (ref 39.0–52.0)
HEMOGLOBIN: 9.7 g/dL — AB (ref 13.0–17.0)
MCH: 25.3 pg — ABNORMAL LOW (ref 26.0–34.0)
MCHC: 30.2 g/dL (ref 30.0–36.0)
MCV: 83.6 fL (ref 78.0–100.0)
Platelets: 285 10*3/uL (ref 150–400)
RBC: 3.84 MIL/uL — ABNORMAL LOW (ref 4.22–5.81)
RDW: 21.3 % — AB (ref 11.5–15.5)
WBC: 7.7 10*3/uL (ref 4.0–10.5)

## 2014-02-08 LAB — BASIC METABOLIC PANEL
ANION GAP: 24 — AB (ref 5–15)
BUN: 33 mg/dL — ABNORMAL HIGH (ref 6–23)
CO2: 18 meq/L — AB (ref 19–32)
Calcium: 8.9 mg/dL (ref 8.4–10.5)
Chloride: 95 mEq/L — ABNORMAL LOW (ref 96–112)
Creatinine, Ser: 1.5 mg/dL — ABNORMAL HIGH (ref 0.50–1.35)
GFR calc Af Amer: 53 mL/min — ABNORMAL LOW (ref >90)
GFR calc non Af Amer: 46 mL/min — ABNORMAL LOW (ref >90)
Glucose, Bld: 142 mg/dL — ABNORMAL HIGH (ref 70–99)
POTASSIUM: 4.1 meq/L (ref 3.7–5.3)
SODIUM: 137 meq/L (ref 137–147)

## 2014-02-08 LAB — MRSA PCR SCREENING: MRSA BY PCR: POSITIVE — AB

## 2014-02-08 LAB — MAGNESIUM: MAGNESIUM: 2 mg/dL (ref 1.5–2.5)

## 2014-02-08 MED ORDER — TRAMADOL HCL 50 MG PO TABS
50.0000 mg | ORAL_TABLET | Freq: Four times a day (QID) | ORAL | Status: DC | PRN
  Filled 2014-02-08: qty 1

## 2014-02-08 NOTE — Progress Notes (Signed)
Utilization Review Completed.Charles [name] T11/22/2015  

## 2014-02-08 NOTE — Progress Notes (Signed)
Patient remains awake . C/o left sided lateral chest pain, rating it a 7/10./ Medicated with 2 mg of Morphine IVP as per MD order, with positive relief noted. Patient maintained on telemetry and is in sinus rhythm with a heart rate of 64, and an occasional PVC noted. Will continue to monitor.  Esperanza Heir, RN

## 2014-02-08 NOTE — Progress Notes (Signed)
PROGRESS NOTE    Charles [name] ATF:[id number] DOB: Apr 15, 1945 DOA: 02/07/2014 PCP: [name]  HPI/Brief narrative 68 year old male with history of CAD, CABG, ischemic cardiomyopathy, LVEF 40-45% in September 2015, most recent nuclear stress test result September 2015 negative for ischemia, OSA not on C Pap, GI bleed, DM, PAD, chronic kidney disease, ongoing cocaine abuse, seen earlier on day of admission at [facility] ER and signed out AMA, presented to [facility] ED with ongoing chest pain and dyspnea.   Assessment/Plan:  1. Atypical chest pain: EKG shows sinus bradycardia without acute changes. Troponin 3 negative. Continue aspirin, nitrates and Zocor changed to pravastatin due to being on amlodipine. Cardiology consultation appreciated-recommend no further workup if troponin negative. Patient counseled extensively regarding cocaine cessation. 2. Mild acute on chronic combined CHF: May be the cause of his atypical chest pain. Continue IV Lasix for additional 24 hours then transition to home dose diuretics. Strict intake and output. Improving. 3. Cocaine abuse: Cessation counseled. Admitted using cocaine 4 days back. UDS + 4. Stage III chronic kidney disease: Baseline creatinine not clear but may be in the 1.5 range. Monitor closely while on diuresis. Not candidate for ACEI/ARB secondary to kidney disease. Stable. Renal ultrasound: Medical renal disease 5. Hyperkalemia: Resolved after diuresis. Monitor. 6. Anemia: May be from chronic kidney disease. Hemoglobin has improved compared to October. 7. Essential hypertension: Mildly uncontrolled. Continue amlodipine and clonidine - dose has been reduced from 0.2 to 0.1 MG twice a day.  8. Sinus bradycardia: Improved after reducing Clonidine 9. History of seizures: States that he has not had a seizure in 3 years. Continue phenytoin. 10. Indeterminate hypoechoic nodule of left mid kidney: Seen on ultrasound and stable.  Outpatient follow-up and evaluation as deemed necessary.     Code Status: Full  Family Communication: None at bedside  Disposition Plan: Home when medically stable - possibly 11/23  Consultants:  Cardiology - signed off  Procedures:  None   Antibiotics:  None   Subjective: Feels better. Still with intermittent CP and SOB.   Objective: Filed Vitals:   02/07/14 1550 02/07/14 2219 02/08/14 0633 02/08/14 0900  BP:  140/81 160/80 157/78  Pulse:  58 72 74  Temp:  98.4 F (36.9 C) 98.1 F (36.7 C) 98.1 F (36.7 C)  TempSrc:  Oral Oral Oral  Resp:  18 18 20   Height: 5\' 7"  (1.702 m)     Weight:   83.1 kg (183 lb 3.2 oz)   SpO2:  98% 93% 100%    Intake/Output Summary (Last 24 hours) at 02/08/14 1411 Last data filed at 02/08/14 0948  Gross per 24 hour  Intake    480 ml  Output   1400 ml  Net   -920 ml   Filed Weights   02/07/14 0717 02/08/14 0633  Weight: 84.324 kg (185 lb 14.4 oz) 83.1 kg (183 lb 3.2 oz)     Exam:  General exam: Pleasant middle-aged male seen sitting up and eating breakfast this morning. Appears comfortable. Respiratory system: Occasional fine basal crackles otherwise clear to auscultation. No increased work of breathing. Cardiovascular system: S1 & S2 heard,regular and bradycardic.1+ pitting bilateral leg edema-decreasing. JVD +. No murmurs or clicks. Telemetry: SB 55 >SR 60s  Gastrointestinal system: Abdomen is nondistended, soft and nontender. Normal bowel sounds heard. Central nervous system: Alert and oriented. No focal neurological deficits. Extremities: Symmetric 5 x 5 power.   Data Reviewed: Basic Metabolic Panel:  Recent Labs Lab 02/07/14 0123  02/07/14 1142 02/07/14 1930 02/08/14 0420  NA 135* 136*  --  137  K 5.4* 4.7  --  4.1  CL 97 98  --  95*  CO2 19 22  --  18*  GLUCOSE 98 105*  --  142*  BUN 39* 40*  --  33*  CREATININE 1.97* 1.80*  --  1.50*  CALCIUM 8.8 8.8  --  8.9  MG  --   --  2.1 2.0   Liver Function  Tests: No results for input(s): AST, ALT, ALKPHOS, BILITOT, PROT, ALBUMIN in the last 168 hours. No results for input(s): LIPASE, AMYLASE in the last 168 hours. No results for input(s): AMMONIA in the last 168 hours. CBC:  Recent Labs Lab 02/07/14 0123 02/08/14 0420  WBC 8.1 7.7  HGB 10.3* 9.7*  HCT 34.2* 32.1*  MCV 85.7 83.6  PLT 276 285   Cardiac Enzymes:  Recent Labs Lab 02/07/14 0956 02/07/14 1142 02/07/14 1930  TROPONINI <0.30 <0.30 <0.30   BNP (last 3 results)  Recent Labs  12/20/13 1032 01/01/14 1107 02/07/14 0123  PROBNP 5934.0* 3225.0* 2648.0*   CBG: No results for input(s): GLUCAP in the last 168 hours.  No results found for this or any previous visit (from the past 240 hour(s)).     Studies: Dg Chest 2 View  02/07/2014   CLINICAL DATA:  Left-sided chest pain with productive cough and shortness of breath. Initial encounter.  EXAM: CHEST  2 VIEW  COMPARISON:  01/01/2014.  FINDINGS: There is stable cardiomegaly status post median sternotomy and CABG. There is progressive prominence of the interstitial markings with fissural thickening and possible mild edema. There is no confluent airspace opacity or significant pleural effusion. The bones appear unchanged.  IMPRESSION: Progressive interstitial prominence and fissural thickening suspicious for mild pulmonary edema. Stable cardiomegaly status post CABG. No evidence of pneumonia.   Electronically Signed   By: Camie Patience M.D.   On: 02/07/2014 02:52   US Renal  02/07/2014   CLINICAL DATA:  Chronic kidney disease stage III, history hyperlipidemia, coronary artery disease, ischemic cardiomyopathy, hypertension, diabetes  EXAM: RENAL/URINARY TRACT ULTRASOUND COMPLETE  COMPARISON:  CT abdomen and pelvis 07/16/2013  FINDINGS: Right Kidney:  Length: 10.8 cm. Normal cortical thickness. Mildly increased cortical echogenicity. No mass, hydronephrosis or shadowing calcification. No perinephric fluid.  Left Kidney:  Length:  11.1 cm. Normal cortical thickness. Mildly increased cortical echogenicity. No hydronephrosis or shadowing calcification. Hypoechoic nodule mid kidney, exophytic, 11 x 9 x 11 mm, containing scattered low level internal echoes. This lesion is indeterminate but unchanged in size since an earlier CT of 03/24/2010.  Bladder:  Normal appearance.  Incidentally noted small amount of ascites.  IMPRESSION: Medical renal disease changes of both kidneys.  Stable indeterminate hypoechoic nodule at mid LEFT kidney, unchanged in size since 03/24/2010.  Small amount of ascites.   Electronically Signed   By: Lavonia Dana M.D.   On: 02/07/2014 16:41        Scheduled Meds: . amLODipine  10 mg Oral Daily  . aspirin EC  81 mg Oral Daily  . citalopram  20 mg Oral Daily  . cloNIDine  0.1 mg Oral BID  . docusate sodium  100 mg Oral BID  . ferrous sulfate  325 mg Oral Daily  . furosemide  80 mg Intravenous BID  . gabapentin  100 mg Oral BID  . guaiFENesin  600 mg Oral BID  . heparin  5,000 Units Subcutaneous 3 times per day  .  hydrALAZINE  75 mg Oral TID  . isosorbide mononitrate  30 mg Oral Daily  . pantoprazole  40 mg Oral BID  . phenytoin  200 mg Oral Daily  . phenytoin  300 mg Oral QHS  . pravastatin  40 mg Oral q1800  . sodium chloride  3 mL Intravenous Q12H   Continuous Infusions:   Principal Problem:   Acute on chronic combined systolic and diastolic heart failure Active Problems:   Iron deficiency anemia   Hypertension   CAD- CABG x3 '09. Myoview no ischemia 11/27/13   Chronic kidney disease (CKD), stage III (moderate)   Homelessness   Polysubstance abuse   Peripheral vascular disease   Seizure disorder   Bradycardia   Gastric AVM   History of GI bleed   Chest pain   CHF exacerbation    Time spent: 40 minutes.    Vernell Leep, MD, FACP, FHM. [facility] Pager [phone]  If 7PM-7AM, please contact night-coverage [email or web address] Password [email or web address] 02/08/2014, 2:11 PM    LOS:  1 day

## 2014-02-09 DIAGNOSIS — F1410 Cocaine abuse, uncomplicated: Secondary | ICD-10-CM | POA: Insufficient documentation

## 2014-02-09 DIAGNOSIS — I5043 Acute on chronic combined systolic (congestive) and diastolic (congestive) heart failure: Principal | ICD-10-CM

## 2014-02-09 DIAGNOSIS — I2510 Atherosclerotic heart disease of native coronary artery without angina pectoris: Secondary | ICD-10-CM

## 2014-02-09 DIAGNOSIS — F1910 Other psychoactive substance abuse, uncomplicated: Secondary | ICD-10-CM

## 2014-02-09 LAB — CBC
HCT: 34.2 % — ABNORMAL LOW (ref 39.0–52.0)
HEMOGLOBIN: 10.2 g/dL — AB (ref 13.0–17.0)
MCH: 25 pg — ABNORMAL LOW (ref 26.0–34.0)
MCHC: 29.8 g/dL — ABNORMAL LOW (ref 30.0–36.0)
MCV: 83.8 fL (ref 78.0–100.0)
Platelets: 307 10*3/uL (ref 150–400)
RBC: 4.08 MIL/uL — ABNORMAL LOW (ref 4.22–5.81)
RDW: 21.8 % — ABNORMAL HIGH (ref 11.5–15.5)
WBC: 6.6 10*3/uL (ref 4.0–10.5)

## 2014-02-09 LAB — BASIC METABOLIC PANEL
Anion gap: 18 — ABNORMAL HIGH (ref 5–15)
BUN: 21 mg/dL (ref 6–23)
CALCIUM: 8.9 mg/dL (ref 8.4–10.5)
CO2: 23 meq/L (ref 19–32)
Chloride: 97 mEq/L (ref 96–112)
Creatinine, Ser: 1.14 mg/dL (ref 0.50–1.35)
GFR calc Af Amer: 73 mL/min — ABNORMAL LOW (ref >90)
GFR calc non Af Amer: 63 mL/min — ABNORMAL LOW (ref >90)
Glucose, Bld: 72 mg/dL (ref 70–99)
Potassium: 4.1 mEq/L (ref 3.7–5.3)
SODIUM: 138 meq/L (ref 137–147)

## 2014-02-09 LAB — GLUCOSE, CAPILLARY: GLUCOSE-CAPILLARY: 103 mg/dL — AB (ref 70–99)

## 2014-02-09 MED ORDER — MUPIROCIN 2 % EX OINT
1.0000 "application " | TOPICAL_OINTMENT | Freq: Two times a day (BID) | CUTANEOUS | Status: DC
  Administered 2014-02-09 (×2): 1 via NASAL
  Filled 2014-02-09: qty 22

## 2014-02-09 MED ORDER — NITROGLYCERIN 0.4 MG SL SUBL: 0.4000 mg | SUBLINGUAL_TABLET | SUBLINGUAL | Status: DC | PRN

## 2014-02-09 MED ORDER — CLONIDINE HCL 0.2 MG PO TABS: 0.1000 mg | ORAL_TABLET | Freq: Two times a day (BID) | ORAL | Status: DC

## 2014-02-09 MED ORDER — CHLORHEXIDINE GLUCONATE CLOTH 2 % EX PADS
6.0000 | MEDICATED_PAD | Freq: Every day | CUTANEOUS | Status: DC
  Administered 2014-02-09: 6 via TOPICAL

## 2014-02-09 MED ORDER — PRAVASTATIN SODIUM 40 MG PO TABS: 40.0000 mg | ORAL_TABLET | Freq: Every day | ORAL | Status: DC

## 2014-02-09 MED ORDER — HYDRALAZINE HCL 50 MG PO TABS: 75.0000 mg | ORAL_TABLET | Freq: Three times a day (TID) | ORAL | Status: DC

## 2014-02-09 NOTE — Discharge Instructions (Signed)
Heart Failure °Heart failure is a condition in which the heart has trouble pumping blood. This means your heart does not pump blood efficiently for your body to work well. In some cases of heart failure, fluid may back up into your lungs or you may have swelling (edema) in your lower legs. Heart failure is usually a long-term (chronic) condition. It is important for you to take good care of yourself and follow your health care provider's treatment plan. °CAUSES  °Some health conditions can cause heart failure. Those health conditions include: °· High blood pressure (hypertension). Hypertension causes the heart muscle to work harder than normal. When pressure in the blood vessels is high, the heart needs to pump (contract) with more force in order to circulate blood throughout the body. High blood pressure eventually causes the heart to become stiff and weak. °· Coronary artery disease (CAD). CAD is the buildup of cholesterol and fat (plaque) in the arteries of the heart. The blockage in the arteries deprives the heart muscle of oxygen and blood. This can cause chest pain and may lead to a heart attack. High blood pressure can also contribute to CAD. °· Heart attack (myocardial infarction). A heart attack occurs when one or more arteries in the heart become blocked. The loss of oxygen damages the muscle tissue of the heart. When this happens, part of the heart muscle dies. The injured tissue does not contract as well and weakens the heart's ability to pump blood. °· Abnormal heart valves. When the heart valves do not open and close properly, it can cause heart failure. This makes the heart muscle pump harder to keep the blood flowing. °· Heart muscle disease (cardiomyopathy or myocarditis). Heart muscle disease is damage to the heart muscle from a variety of causes. These can include drug or alcohol abuse, infections, or unknown reasons. These can increase the risk of heart failure. °· Lung disease. Lung disease  makes the heart work harder because the lungs do not work properly. This can cause a strain on the heart, leading it to fail. °· Diabetes. Diabetes increases the risk of heart failure. High blood sugar contributes to high fat (lipid) levels in the blood. Diabetes can also cause slow damage to tiny blood vessels that carry important nutrients to the heart muscle. When the heart does not get enough oxygen and food, it can cause the heart to become weak and stiff. This leads to a heart that does not contract efficiently. °· Other conditions can contribute to heart failure. These include abnormal heart rhythms, thyroid problems, and low blood counts (anemia). °Certain unhealthy behaviors can increase the risk of heart failure, including: °· Being overweight. °· Smoking or chewing tobacco. °· Eating foods high in fat and cholesterol. °· Abusing illicit drugs or alcohol. °· Lacking physical activity. °SYMPTOMS  °Heart failure symptoms may vary and can be hard to detect. Symptoms may include: °· Shortness of breath with activity, such as climbing stairs. °· Persistent cough. °· Swelling of the feet, ankles, legs, or abdomen. °· Unexplained weight gain. °· Difficulty breathing when lying flat (orthopnea). °· Waking from sleep because of the need to sit up and get more air. °· Rapid heartbeat. °· Fatigue and loss of energy. °· Feeling light-headed, dizzy, or close to fainting. °· Loss of appetite. °· Nausea. °· Increased urination during the night (nocturia). °DIAGNOSIS  °A diagnosis of heart failure is based on your history, symptoms, physical examination, and diagnostic tests. Diagnostic tests for heart failure may include: °·   Echocardiography.  Electrocardiography.  Chest X-ray.  Blood tests.  Exercise stress test.  Cardiac angiography.  Radionuclide scans. TREATMENT  Treatment is aimed at managing the symptoms of heart failure. Medicines, behavioral changes, or surgical intervention may be necessary to  treat heart failure.  Medicines to help treat heart failure may include:  Angiotensin-converting enzyme (ACE) inhibitors. This type of medicine blocks the effects of a blood protein called angiotensin-converting enzyme. ACE inhibitors relax (dilate) the blood vessels and help lower blood pressure.  Angiotensin receptor blockers (ARBs). This type of medicine blocks the actions of a blood protein called angiotensin. Angiotensin receptor blockers dilate the blood vessels and help lower blood pressure.  Water pills (diuretics). Diuretics cause the kidneys to remove salt and water from the blood. The extra fluid is removed through urination. This loss of extra fluid lowers the volume of blood the heart pumps.  Beta blockers. These prevent the heart from beating too fast and improve heart muscle strength.  Digitalis. This increases the force of the heartbeat.  Healthy behavior changes include:  Obtaining and maintaining a healthy weight.  Stopping smoking or chewing tobacco.  Eating heart-healthy foods.  Limiting or avoiding alcohol.  Stopping illicit drug use.  Physical activity as directed by your health care provider.  Surgical treatment for heart failure may include:  A procedure to open blocked arteries, repair damaged heart valves, or remove damaged heart muscle tissue.  A pacemaker to improve heart muscle function and control certain abnormal heart rhythms.  An internal cardioverter defibrillator to treat certain serious abnormal heart rhythms.  A left ventricular assist device (LVAD) to assist the pumping ability of the heart. HOME CARE INSTRUCTIONS   Take medicines only as directed by your health care provider. Medicines are important in reducing the workload of your heart, slowing the progression of heart failure, and improving your symptoms.  Do not stop taking your medicine unless directed by your health care provider.  Do not skip any dose of medicine.  Refill your  prescriptions before you run out of medicine. Your medicines are needed every day.  Engage in moderate physical activity if directed by your health care provider. Moderate physical activity can benefit some people. The elderly and people with severe heart failure should consult with a health care provider for physical activity recommendations.  Eat heart-healthy foods. Food choices should be free of trans fat and low in saturated fat, cholesterol, and salt (sodium). Healthy choices include fresh or frozen fruits and vegetables, fish, lean meats, legumes, fat-free or low-fat dairy products, and whole grain or high fiber foods. Talk to a dietitian to learn more about heart-healthy foods.  Limit sodium if directed by your health care provider. Sodium restriction may reduce symptoms of heart failure in some people. Talk to a dietitian to learn more about heart-healthy seasonings.  Use healthy cooking methods. Healthy cooking methods include roasting, grilling, broiling, baking, poaching, steaming, or stir-frying. Talk to a dietitian to learn more about healthy cooking methods.  Limit fluids if directed by your health care provider. Fluid restriction may reduce symptoms of heart failure in some people.  Weigh yourself every day. Daily weights are important in the early recognition of excess fluid. You should weigh yourself every morning after you urinate and before you eat breakfast. Wear the same amount of clothing each time you weigh yourself. Record your daily weight. Provide your health care provider with your weight record.  Monitor and record your blood pressure if directed by your health care  provider.  Check your pulse if directed by your health care provider.  Lose weight if directed by your health care provider. Weight loss may reduce symptoms of heart failure in some people.  Stop smoking or chewing tobacco. Nicotine makes your heart work harder by causing your blood vessels to constrict.  Do not use nicotine gum or patches before talking to your health care provider.  Keep all follow-up visits as directed by your health care provider. This is important.  Limit alcohol intake to no more than 1 drink per day for nonpregnant women and 2 drinks per day for men. One drink equals 12 ounces of beer, 5 ounces of wine, or 1 ounces of hard liquor. Drinking more than that is harmful to your heart. Tell your health care provider if you drink alcohol several times a week. Talk with your health care provider about whether alcohol is safe for you. If your heart has already been damaged by alcohol or you have severe heart failure, drinking alcohol should be stopped completely.  Stop illicit drug use.  Stay up-to-date with immunizations. It is especially important to prevent respiratory infections through current pneumococcal and influenza immunizations.  Manage other health conditions such as hypertension, diabetes, thyroid disease, or abnormal heart rhythms as directed by your health care provider.  Learn to manage stress.  Plan rest periods when fatigued.  Learn strategies to manage high temperatures. If the weather is extremely hot:  Avoid vigorous physical activity.  Use air conditioning or fans or seek a cooler location.  Avoid caffeine and alcohol.  Wear loose-fitting, lightweight, and light-colored clothing.  Learn strategies to manage cold temperatures. If the weather is extremely cold:  Avoid vigorous physical activity.  Layer clothes.  Wear mittens or gloves, a hat, and a scarf when going outside.  Avoid alcohol.  Obtain ongoing education and support as needed.  Participate in or seek rehabilitation as needed to maintain or improve independence and quality of life. SEEK MEDICAL CARE IF:   Your weight increases by 03 lb/1.4 kg in 1 day or 05 lb/2.3 kg in a week.  You have increasing shortness of breath that is unusual for you.  You are unable to participate in  your usual physical activities.  You tire easily.  You cough more than normal, especially with physical activity.  You have any or more swelling in areas such as your hands, feet, ankles, or abdomen.  You are unable to sleep because it is hard to breathe.  You feel like your heart is beating fast (palpitations).  You become dizzy or light-headed upon standing up. SEEK IMMEDIATE MEDICAL CARE IF:   You have difficulty breathing.  There is a change in mental status such as decreased alertness or difficulty with concentration.  You have a pain or discomfort in your chest.  You have an episode of fainting (syncope). MAKE SURE YOU:   Understand these instructions.  Will watch your condition.  Will get help right away if you are not doing well or get worse. Document Released: 03/06/2005 Document Revised: 07/21/2013 Document Reviewed: 04/05/2012 [facility] Patient Information 2015 [place], [place]. This information is not intended to replace advice given to you by your health care provider. Make sure you discuss any questions you have with your health care provider.  Chest Pain (Nonspecific) It is often hard to give a specific diagnosis for the cause of chest pain. There is always a chance that your pain could be related to something serious, such as a heart  attack or a blood clot in the lungs. You need to follow up with your health care provider for further evaluation. CAUSES   Heartburn.  Pneumonia or bronchitis.  Anxiety or stress.  Inflammation around your heart (pericarditis) or lung (pleuritis or pleurisy).  A blood clot in the lung.  A collapsed lung (pneumothorax). It can develop suddenly on its own (spontaneous pneumothorax) or from trauma to the chest.  Shingles infection (herpes zoster virus). The chest wall is composed of bones, muscles, and cartilage. Any of these can be the source of the pain.  The bones can be bruised by injury.  The muscles or cartilage can  be strained by coughing or overwork.  The cartilage can be affected by inflammation and become sore (costochondritis). DIAGNOSIS  Lab tests or other studies may be needed to find the cause of your pain. Your health care provider may have you take a test called an ambulatory electrocardiogram (ECG). An ECG records your heartbeat patterns over a 24-hour period. You may also have other tests, such as:  Transthoracic echocardiogram (TTE). During echocardiography, sound waves are used to evaluate how blood flows through your heart.  Transesophageal echocardiogram (TEE).  Cardiac monitoring. This allows your health care provider to monitor your heart rate and rhythm in real time.  Holter monitor. This is a portable device that records your heartbeat and can help diagnose heart arrhythmias. It allows your health care provider to track your heart activity for several days, if needed.  Stress tests by exercise or by giving medicine that makes the heart beat faster. TREATMENT   Treatment depends on what may be causing your chest pain. Treatment may include:  Acid blockers for heartburn.  Anti-inflammatory medicine.  Pain medicine for inflammatory conditions.  Antibiotics if an infection is present.  You may be advised to change lifestyle habits. This includes stopping smoking and avoiding alcohol, caffeine, and chocolate.  You may be advised to keep your head raised (elevated) when sleeping. This reduces the chance of acid going backward from your stomach into your esophagus. Most of the time, nonspecific chest pain will improve within 2-3 days with rest and mild pain medicine.  HOME CARE INSTRUCTIONS   If antibiotics were prescribed, take them as directed. Finish them even if you start to feel better.  For the next few days, avoid physical activities that bring on chest pain. Continue physical activities as directed.  Do not use any tobacco products, including cigarettes, chewing tobacco,  or electronic cigarettes.  Avoid drinking alcohol.  Only take medicine as directed by your health care provider.  Follow your health care provider's suggestions for further testing if your chest pain does not go away.  Keep any follow-up appointments you made. If you do not go to an appointment, you could develop lasting (chronic) problems with pain. If there is any problem keeping an appointment, call to reschedule. SEEK MEDICAL CARE IF:   Your chest pain does not go away, even after treatment.  You have a rash with blisters on your chest.  You have a fever. SEEK IMMEDIATE MEDICAL CARE IF:   You have increased chest pain or pain that spreads to your arm, neck, jaw, back, or abdomen.  You have shortness of breath.  You have an increasing cough, or you cough up blood.  You have severe back or abdominal pain.  You feel nauseous or vomit.  You have severe weakness.  You faint.  You have chills. This is an emergency. Do not wait  to see if the pain will go away. Get medical help at once. Call your local emergency services (911 in [place] Do not drive yourself to the hospital. MAKE SURE YOU:   Understand these instructions.  Will watch your condition.  Will get help right away if you are not doing well or get worse. Document Released: 12/14/2004 Document Revised: 03/11/2013 Document Reviewed: 10/10/2007 [facility] Patient Information 2015 [place], [place]. This information is not intended to replace advice given to you by your health care provider. Make sure you discuss any questions you have with your health care provider.

## 2014-02-09 NOTE — Progress Notes (Signed)
Subjective: No CP.  Sleeping well.  Objective: Vital signs in last 24 hours: Temp:  [98 F (36.7 C)-98.3 F (36.8 C)] 98 F (36.7 C) (11/23 0709) Pulse Rate:  [60-74] 65 (11/23 0709) Resp:  [18-20] 18 (11/23 0709) BP: (154-165)/(68-90) 165/90 mmHg (11/23 0709) SpO2:  [97 %-98 %] 98 % (11/23 0709) Weight:  [178 lb 8 oz (80.967 kg)] 178 lb 8 oz (80.967 kg) (11/23 0657) Last BM Date: 02/09/14  Intake/Output from previous day: 11/22 0701 - 11/23 0700 In: 720 [P.O.:720] Out: 3400 [Urine:3400] Intake/Output this shift: Total I/O In: 240 [P.O.:240] Out: 325 [Urine:325]  Medications Current Facility-Administered Medications  Medication Dose Route Frequency Provider Last Rate Last Dose  . 0.9 %  sodium chloride infusion  250 mL Intravenous PRN Ivor Costa, MD      . amLODipine (NORVASC) tablet 10 mg  10 mg Oral Daily Ivor Costa, MD   10 mg at 02/08/14 1026  . aspirin EC tablet 81 mg  81 mg Oral Daily Modena Jansky, MD   81 mg at 02/08/14 1028  . Chlorhexidine Gluconate Cloth 2 % PADS 6 each  6 each Topical Q0600 Modena Jansky, MD      . citalopram (CELEXA) tablet 20 mg  20 mg Oral Daily Ivor Costa, MD   20 mg at 02/08/14 1026  . cloNIDine (CATAPRES) tablet 0.1 mg  0.1 mg Oral BID Ivor Costa, MD   0.1 mg at 02/08/14 2201  . docusate sodium (COLACE) capsule 100 mg  100 mg Oral BID Ivor Costa, MD   100 mg at 02/08/14 2201  . ferrous sulfate tablet 325 mg  325 mg Oral Daily Ivor Costa, MD   325 mg at 02/08/14 1027  . furosemide (LASIX) injection 80 mg  80 mg Intravenous BID Ivor Costa, MD   80 mg at 02/08/14 1822  . gabapentin (NEURONTIN) capsule 100 mg  100 mg Oral BID Ivor Costa, MD   100 mg at 02/08/14 2200  . guaiFENesin (MUCINEX) 12 hr tablet 600 mg  600 mg Oral BID Ivor Costa, MD   600 mg at 02/08/14 2200  . heparin injection 5,000 Units  5,000 Units Subcutaneous 3 times per day Ivor Costa, MD   5,000 Units at 02/09/14 [phone]  . hydrALAZINE (APRESOLINE) tablet 75 mg  75 mg Oral TID  Ivor Costa, MD   75 mg at 02/08/14 2200  . isosorbide mononitrate (IMDUR) 24 hr tablet 30 mg  30 mg Oral Daily Ivor Costa, MD   30 mg at 02/08/14 1028  . mupirocin ointment (BACTROBAN) 2 % 1 application  1 application Nasal BID Modena Jansky, MD   1 application at 76/28/31 [phone]  . nitroGLYCERIN (NITROSTAT) SL tablet 0.4 mg  0.4 mg Sublingual Q5 min PRN Julianne Rice, MD   0.4 mg at 02/07/14 0200  . pantoprazole (PROTONIX) EC tablet 40 mg  40 mg Oral BID Ivor Costa, MD   40 mg at 02/08/14 2200  . phenytoin (DILANTIN) ER capsule 200 mg  200 mg Oral Daily Ivor Costa, MD   200 mg at 02/08/14 1027  . phenytoin (DILANTIN) ER capsule 300 mg  300 mg Oral QHS Modena Jansky, MD   300 mg at 02/08/14 2247  . pravastatin (PRAVACHOL) tablet 40 mg  40 mg Oral q1800 Modena Jansky, MD   40 mg at 02/08/14 1822  . sodium chloride 0.9 % injection 3 mL  3 mL Intravenous Q12H Ivor Costa, MD  3 mL at 02/08/14 2248  . sodium chloride 0.9 % injection 3 mL  3 mL Intravenous PRN Ivor Costa, MD      . traMADol Veatrice Bourbon) tablet 50 mg  50 mg Oral Q6H PRN Modena Jansky, MD        PE: General appearance: alert, cooperative, no distress and Was sleeping when I entered the room Lungs: clear to auscultation bilaterally Heart: regular rate and rhythm, S1, S2 normal, no murmur, click, rub or gallop Extremities: Trace LEE Pulses: 2+ and symmetric Skin: Warm and dry Neurologic: Grossly normal  Lab Results:   Recent Labs  02/07/14 0123 02/08/14 0420 02/09/14 0342  WBC 8.1 7.7 6.6  HGB 10.3* 9.7* 10.2*  HCT 34.2* 32.1* 34.2*  PLT 276 285 307   BMET  Recent Labs  02/07/14 1142 02/08/14 0420 02/09/14 0342  NA 136* 137 138  K 4.7 4.1 4.1  CL 98 95* 97  CO2 22 18* 23  GLUCOSE 105* 142* 72  BUN 40* 33* 21  CREATININE 1.80* 1.50* 1.14  CALCIUM 8.8 8.9 8.9   PT/INR  Recent Labs  02/07/14 0956  LABPROT 16.7*  INR 1.34    Assessment/Plan 68 y.o.male with known history of coronary artery disease,  coronary artery bypass grafting in 2009, ischemic cardiomyopathy with most recent echocardiogram in September 2015 revealing an EF of 40-45%, most recent nuclear medicine study September 2015, negative for ischemia, who presented to the emergency room after being seen earlier that day at [facility] emergency room, signing out [facility]. The patient has a past medical history of substance abuse to include cocaine , obstructive sleep apnea, GI bleeding , peripheral vascular disease and chronic kidney disease.      Acute on chronic combined systolic and diastolic heart failure  Net fluids:  -2.7L/-4.0L.  On lasix 80mg  IV bid.  Can change to PO lasix.    Chest pain  Atypical.  Ruled out for MI.  None today.    Iron deficiency anemia   Hypertension Poorly controlled.  Amlodipine 10mg , Clonidine 0.1mg  BID, hydralazine 75 TID, Imdur 30.  No ACE or ARB.  I would increase hydralazine to 100.    CAD- CABG x3 '09. Myoview no ischemia 11/27/13   Chronic kidney disease (CKD), stage III (moderate)  SCr improving.  1.80>>1.50>>1.14.     Homelessness   Polysubstance abuse  Cocaine +.  No beta blockers   Peripheral vascular disease   Seizure disorder   Bradycardia: Stable   Gastric AVM   History of GI bleed   The patient reports he is leaving today.     LOS: 2 days    HAGER, BRYAN PA-C 02/09/2014 10:55 AM  Attending Note:   The patient was seen and examined.  Agree with assessment and plan as noted above.  Changes made to the above note as needed.  Advised patient that he should not use cocaine.  No further recs.  Will sign off.   He may follow up with his medical doctor.    Thayer Headings, Brooke Bonito., MD, [facility] 02/09/2014, 1:48 PM [address]  [place] Pager [phone]

## 2014-02-09 NOTE — Clinical Documentation Improvement (Signed)
  Patient with CKD III. On admit BUN/Cr: 39/1.97- 40/1.80 Gfr: 33,  11/22: 33/1.50, 46,  11/23: 21/1.14, 63. Treated with NS IV 0-10 ml/hr and monitoring renal function while treating acute CHF. Please address abnormal labs in Notes.  Poss Conditions -- AKI on CKD 3 -- mild AKI -- Mild ARF resolved -- Other condition  Thank Sheral Flow RN [place] HIM department

## 2014-02-09 NOTE — Care Management Note (Signed)
    Page 1 of 1   02/09/2014     2:49:31 PM CARE MANAGEMENT NOTE 02/09/2014  Patient:  DONTRAE, MORINI   Account Number:  [id number]  Date Initiated:  02/09/2014  Documentation initiated by:  Mariann Laster  Subjective/Objective Assessment:   chf     Action/Plan:   cm to follow fo disposition needs   Anticipated DC Date:  02/09/2014   Anticipated DC Plan:  HOME/SELF CARE  In-house referral  Clinical Social Worker         Choice offered to / List presented to:             Status of service:  Completed, signed off Medicare Important Message given?  NA - LOS <3 / Initial given by admissions (If response is "NO", the following Medicare IM given date fields will be blank) Date Medicare IM given:   Medicare IM given by:   Date Additional Medicare IM given:   Additional Medicare IM given by:    Discharge Disposition:  HOME/SELF CARE  Per UR Regulation:  Reviewed for med. necessity/level of care/duration of stay  If discussed at [place] of Stay Meetings, dates discussed:    Comments:  Corryn Madewell RN, BSN, MSHL, CCM  Nurse - Case Manager,  ([facility] [place])  [phone]  02/09/2014 SW referral for Transportation needs. Dispo Plan:  Home / Self care.

## 2014-02-09 NOTE — Discharge Summary (Signed)
Physician Discharge Summary  Charles [name] LDJ:[id number] DOB: 08-Feb-1946 DOA: 02/07/2014  PCP: [name]  Admit date: 02/07/2014 Discharge date: 02/09/2014  Time spent: Greater than 30 minutes  Recommendations for Outpatient Follow-up:  1. Dr. [name], PCP in 5 days with repeat labs (CBC & BMP). 2. Dr. Rozann Lesches, Cardiology in 1 week. 3. Consider outpatient evaluation of findings seen on renal ultrasound-indeterminate hypoechoic nodule off left mid kidney, as deemed necessary.  Discharge Diagnoses:  Principal Problem:   Acute on chronic combined systolic and diastolic heart failure Active Problems:   Iron deficiency anemia   Hypertension   CAD- CABG x3 '09. Myoview no ischemia 11/27/13   Chronic kidney disease (CKD), stage III (moderate)   Homelessness   Polysubstance abuse   Peripheral vascular disease   Seizure disorder   Bradycardia   Gastric AVM   History of GI bleed   Chest pain   CHF exacerbation   Discharge Condition: Improved & Stable  Diet recommendation: Heart healthy diet.  Filed Weights   02/07/14 0717 02/08/14 0300 02/09/14 0657  Weight: 84.324 kg (185 lb 14.4 oz) 83.1 kg (183 lb 3.2 oz) 80.967 kg (178 lb 8 oz)    History of present illness:  68 year old male with history of CAD, CABG, ischemic cardiomyopathy, LVEF 40-45% in September 2015, most recent nuclear stress test result September 2015 negative for ischemia, OSA not on C Pap, GI bleed, DM, PAD, chronic kidney disease, ongoing cocaine abuse, seen earlier on day of admission at [facility] ER and signed out AMA, presented to [facility] ED with ongoing chest pain and dyspnea.  Hospital Course:    1. Atypical chest pain: EKG shows sinus bradycardia without acute changes. Troponin 3 negative. Continue aspirin, nitrates and Zocor changed to pravastatin due to being on amlodipine. Cardiology consultation appreciated-recommend no further workup if troponin negative.  Patient counseled extensively regarding cocaine cessation. Chest pain resolved. 2. Mild acute on chronic combined CHF/cardiomyopathy: May be the cause of his atypical chest pain. Treated with IV Lasix and is -4 L since admission. Clinically much improved. It appears as though patient may not be fully compliant with his diet, sodium restriction and continues to use cocaine. Counseled extensively regarding appropriate care. Resume home dose of Lasix at discharge. Not candidate for beta blockers or ACEI/ARB as discussed below. Continue nitrates and hydralazine. 3. Cocaine abuse: Cessation counseled. Admitted using cocaine 4 days prior to admission. UDS + 4. Stage III chronic kidney disease: Baseline creatinine not clear but may be in the 1.5 range. Not candidate for ACEI/ARB secondary to kidney disease. Stable. Renal ultrasound: Medical renal disease. Creatinine has steadily improved from 1.8 > 1.14. He may have had mild acute on stage III chronic kidney disease related to poor perfusion from CHF which has improved with diuresis. Close follow-up of BMP as outpatient. 5. Hyperkalemia: Resolved after diuresis. Monitor. 6. Anemia: May be from chronic kidney disease. Hemoglobin has improved compared to October. 7. Essential hypertension: Mildly uncontrolled. Continue amlodipine, increased dose of hydralazine and clonidine - dose has been reduced from 0.2 to 0.1 MG twice a day secondary to bradycardia. Not candidate for beta blocker secondary to cocaine abuse and not candidate for ACEI/ARB secondary to kidney disease. 8. Sinus bradycardia: Resolved after reducing Clonidine 9. History of seizures: States that he has not had a seizure in 3 years. Continue phenytoin. 10. Indeterminate hypoechoic nodule of left mid kidney: Seen on ultrasound and stable. Outpatient follow-up and evaluation as deemed necessary.  Consultations:  Cardiology  Procedures:  None    Discharge Exam:  Complaints:  States that he  continues to feel better. No reported chest pain or dyspnea today. Still has some leg swelling but improving.  Filed Vitals:   02/08/14 1430 02/08/14 2124 02/09/14 0657 02/09/14 0709  BP: 154/73 163/68  165/90  Pulse: 60 74  65  Temp: 98 F (36.7 C) 98.3 F (36.8 C)  98 F (36.7 C)  TempSrc: Oral Oral  Oral  Resp: 20 18  18   Height:      Weight:   80.967 kg (178 lb 8 oz)   SpO2: 98% 97%  98%   General exam: Pleasant middle-aged male seen sitting up at edge of bed. Appears comfortable. Respiratory system: Occasional fine basal crackles otherwise clear to auscultation. No increased work of breathing. Cardiovascular system: S1 & S2 heard, RRR .1+ pitting bilateral leg edema (appears chronic)-decreasing. No JVD. No murmurs or clicks. Telemetry: Sinus rhythm with BBB morphology  Gastrointestinal system: Abdomen is nondistended, soft and nontender. Normal bowel sounds heard. Central nervous system: Alert and oriented. No focal neurological deficits. Extremities: Symmetric 5 x 5 power.  Discharge Instructions      Discharge Instructions    (HEART FAILURE PATIENTS) Call MD:  Anytime you have any of the following symptoms: 1) 3 pound weight gain in 24 hours or 5 pounds in 1 week 2) shortness of breath, with or without a dry hacking cough 3) swelling in the hands, feet or stomach 4) if you have to sleep on extra pillows at night in order to breathe.    Complete by:  As directed      Call MD for:  difficulty breathing, headache or visual disturbances    Complete by:  As directed      Call MD for:  severe uncontrolled pain    Complete by:  As directed      Diet - low sodium heart healthy    Complete by:  As directed      Increase activity slowly    Complete by:  As directed             Medication List    STOP taking these medications        simvastatin 40 MG tablet  Commonly known as:  ZOCOR      TAKE these medications        amLODipine 10 MG tablet  Commonly known as:   NORVASC  Take 10 mg by mouth daily.     aspirin EC 81 MG tablet  Take 81 mg by mouth daily.     citalopram 20 MG tablet  Commonly known as:  CELEXA  Take 20 mg by mouth daily.     cloNIDine 0.2 MG tablet  Commonly known as:  CATAPRES  Take 0.5 tablets (0.1 mg total) by mouth 2 (two) times daily.     docusate sodium 100 MG capsule  Commonly known as:  COLACE  Take 100 mg by mouth 2 (two) times daily.     ferrous sulfate 325 (65 FE) MG tablet  Take 325 mg by mouth daily.     furosemide 80 MG tablet  Commonly known as:  LASIX  Take 80 mg by mouth daily.     gabapentin 100 MG capsule  Commonly known as:  NEURONTIN  Take 100 mg by mouth 2 (two) times daily.     hydrALAZINE 50 MG tablet  Commonly known as:  APRESOLINE  Take 1.5 tablets (  75 mg total) by mouth 3 (three) times daily.     isosorbide mononitrate 30 MG 24 hr tablet  Commonly known as:  IMDUR  Take 30 mg by mouth daily.     nitroGLYCERIN 0.4 MG SL tablet  Commonly known as:  NITROSTAT  Place 1 tablet (0.4 mg total) under the tongue every 5 (five) minutes as needed for chest pain (upto max 3 doses at one time.).     pantoprazole 40 MG tablet  Commonly known as:  PROTONIX  Take 40 mg by mouth 2 (two) times daily.     phenytoin 100 MG ER capsule  Commonly known as:  DILANTIN  Take 200-300 mg by mouth 2 (two) times daily. 200mg  in the morning, 300mg  in the evening     pravastatin 40 MG tablet  Commonly known as:  PRAVACHOL  Take 1 tablet (40 mg total) by mouth daily at 6 PM.       Follow-up Information    Follow up with [name]. Schedule an appointment as soon as possible for a visit in 5 days.   Specialty:  Family Medicine   Why:  To be seen with repeat labs (CBC & BMP).   Contact information:   [address], [address] [facility] [place] [address] [phone]       Follow up with Rozann Lesches, MD. Schedule an appointment as soon as possible for a visit in 1 week.    Specialty:  Cardiology   Contact information:   [address] [place] [address] [phone]        The results of significant diagnostics from this hospitalization (including imaging, microbiology, ancillary and laboratory) are listed below for reference.    Significant Diagnostic Studies: Dg Chest 2 View  02/07/2014   CLINICAL DATA:  Left-sided chest pain with productive cough and shortness of breath. Initial encounter.  EXAM: CHEST  2 VIEW  COMPARISON:  01/01/2014.  FINDINGS: There is stable cardiomegaly status post median sternotomy and CABG. There is progressive prominence of the interstitial markings with fissural thickening and possible mild edema. There is no confluent airspace opacity or significant pleural effusion. The bones appear unchanged.  IMPRESSION: Progressive interstitial prominence and fissural thickening suspicious for mild pulmonary edema. Stable cardiomegaly status post CABG. No evidence of pneumonia.   Electronically Signed   By: Camie Patience M.D.   On: 02/07/2014 02:52   US Renal  02/07/2014   CLINICAL DATA:  Chronic kidney disease stage III, history hyperlipidemia, coronary artery disease, ischemic cardiomyopathy, hypertension, diabetes  EXAM: RENAL/URINARY TRACT ULTRASOUND COMPLETE  COMPARISON:  CT abdomen and pelvis 07/16/2013  FINDINGS: Right Kidney:  Length: 10.8 cm. Normal cortical thickness. Mildly increased cortical echogenicity. No mass, hydronephrosis or shadowing calcification. No perinephric fluid.  Left Kidney:  Length: 11.1 cm. Normal cortical thickness. Mildly increased cortical echogenicity. No hydronephrosis or shadowing calcification. Hypoechoic nodule mid kidney, exophytic, 11 x 9 x 11 mm, containing scattered low level internal echoes. This lesion is indeterminate but unchanged in size since an earlier CT of 03/24/2010.  Bladder:  Normal appearance.  Incidentally noted small amount of ascites.  IMPRESSION: Medical renal disease changes  of both kidneys.  Stable indeterminate hypoechoic nodule at mid LEFT kidney, unchanged in size since 03/24/2010.  Small amount of ascites.   Electronically Signed   By: Lavonia Dana M.D.   On: 02/07/2014 16:41    Microbiology: Recent Results (from the past 240 hour(s))  MRSA PCR Screening     Status: Abnormal   Collection Time: 02/08/14  8:09 PM  Result Value Ref Range Status   MRSA by PCR POSITIVE (A) NEGATIVE Final    Comment:        The GeneXpert MRSA Assay (FDA approved for NASAL specimens only), is one component of a comprehensive MRSA colonization surveillance program. It is not intended to diagnose MRSA infection nor to guide or monitor treatment for MRSA infections. RESULT CALLED TO, READ BACK BY AND VERIFIED WITH: JONES,F RN 2258 02/08/14 MITCHELL,L      Labs: Basic Metabolic Panel:  Recent Labs Lab 02/07/14 0123 02/07/14 1142 02/07/14 1930 02/08/14 0420 02/09/14 0342  NA 135* 136*  --  137 138  K 5.4* 4.7  --  4.1 4.1  CL 97 98  --  95* 97  CO2 19 22  --  18* 23  GLUCOSE 98 105*  --  142* 72  BUN 39* 40*  --  33* 21  CREATININE 1.97* 1.80*  --  1.50* 1.14  CALCIUM 8.8 8.8  --  8.9 8.9  MG  --   --  2.1 2.0  --    Liver Function Tests: No results for input(s): AST, ALT, ALKPHOS, BILITOT, PROT, ALBUMIN in the last 168 hours. No results for input(s): LIPASE, AMYLASE in the last 168 hours. No results for input(s): AMMONIA in the last 168 hours. CBC:  Recent Labs Lab 02/07/14 0123 02/08/14 0420 02/09/14 0342  WBC 8.1 7.7 6.6  HGB 10.3* 9.7* 10.2*  HCT 34.2* 32.1* 34.2*  MCV 85.7 83.6 83.8  PLT 276 285 307   Cardiac Enzymes:  Recent Labs Lab 02/07/14 0956 02/07/14 1142 02/07/14 1930  TROPONINI <0.30 <0.30 <0.30   BNP: BNP (last 3 results)  Recent Labs  12/20/13 1032 01/01/14 1107 02/07/14 0123  PROBNP 5934.0* 3225.0* 2648.0*   CBG:  Recent Labs Lab 02/09/14 0708  GLUCAP 103*     Additional labs: 1. Anemia panel: Iron 26,  TIBC 397, saturation ratios 7, ferritin 61, folate >20, B-12: 1191 and reticulocyte count 152. 2. Phenytoin level: 5.4. 3. TSH: 2.040 4. UDS: Positive for cocaine. Has been consistently positive on testing on several locations since 2012 suggesting ongoing use.   Signed:  Vernell Leep, MD, FACP, FHM. [facility] Pager [phone]  If 7PM-7AM, please contact night-coverage [email or web address] Password [email or web address] 02/09/2014, 10:55 AM

## 2014-02-09 NOTE — Progress Notes (Signed)
CSW and BSW Intern met with patient this afternoon.  Patient is very well known to this CSW from multiple past hospitalizations.  Patient lives in [place] in a substandard housing arrangement and continues to use cocaine.  Patient admits to continued use but states that he has not used in over 1 week.  He is hopeful to locate housing in [place] as he wants to move from his current surroundings.  He has a very limited income and states that he needs to find something within his income but does not want Section 8 housing due to "bad influences" in these housing units.  Patient admits that he does not have a significant income enough to pay for a non-assistance apartment.  He states that he has friends and church members in [place] [facility] [facility] [place] [facility] and that they have been supportive.  Patient relates that he requires transport home. As in prior hospital stays- patient does not have anyone to come and pick him up despite multiple tries.  CSW left multiple messages for his friend Legrand Como (also a church member) who transported him home last admission but was unable to reach Legrand Como.  Patient does not meet criteria for EMS transport and he is physically unable to manage the bus system at this time. Thus, CSW received approval for a Taxi voucher ([facility] [employer]) per [employer], [employer] of Clinical SW to transport patient back to [employer].  Patient was very Patent attorney.  Nursing notified and will call for taxi pick-up when d/c is completed.  CSW signing off.  Lorie Phenix. Pauline Good, [place]

## 2014-03-01 ENCOUNTER — Encounter (HOSPITAL_COMMUNITY): Payer: Self-pay

## 2014-03-01 ENCOUNTER — Emergency Department (HOSPITAL_COMMUNITY)
Admission: EM | Admit: 2014-03-01 | Discharge: 2014-03-02 | Disposition: A | Discharge status: Home / Self Care | Payer: Medicare Other | Attending: Emergency Medicine | Admitting: Emergency Medicine

## 2014-03-01 ENCOUNTER — Other Ambulatory Visit: Payer: Self-pay

## 2014-03-01 ENCOUNTER — Emergency Department (HOSPITAL_COMMUNITY): Payer: Medicare Other

## 2014-03-01 DIAGNOSIS — Z590 Homelessness: Secondary | ICD-10-CM | POA: Diagnosis not present

## 2014-03-01 DIAGNOSIS — G40909 Epilepsy, unspecified, not intractable, without status epilepticus: Secondary | ICD-10-CM | POA: Insufficient documentation

## 2014-03-01 DIAGNOSIS — Z7982 Long term (current) use of aspirin: Secondary | ICD-10-CM | POA: Insufficient documentation

## 2014-03-01 DIAGNOSIS — Z79899 Other long term (current) drug therapy: Secondary | ICD-10-CM | POA: Diagnosis not present

## 2014-03-01 DIAGNOSIS — R918 Other nonspecific abnormal finding of lung field: Secondary | ICD-10-CM | POA: Diagnosis not present

## 2014-03-01 DIAGNOSIS — I2510 Atherosclerotic heart disease of native coronary artery without angina pectoris: Secondary | ICD-10-CM | POA: Diagnosis not present

## 2014-03-01 DIAGNOSIS — I129 Hypertensive chronic kidney disease with stage 1 through stage 4 chronic kidney disease, or unspecified chronic kidney disease: Secondary | ICD-10-CM | POA: Diagnosis not present

## 2014-03-01 DIAGNOSIS — E119 Type 2 diabetes mellitus without complications: Secondary | ICD-10-CM | POA: Insufficient documentation

## 2014-03-01 DIAGNOSIS — J189 Pneumonia, unspecified organism: Secondary | ICD-10-CM

## 2014-03-01 DIAGNOSIS — Z8739 Personal history of other diseases of the musculoskeletal system and connective tissue: Secondary | ICD-10-CM | POA: Diagnosis not present

## 2014-03-01 DIAGNOSIS — J159 Unspecified bacterial pneumonia: Secondary | ICD-10-CM | POA: Insufficient documentation

## 2014-03-01 DIAGNOSIS — N183 Chronic kidney disease, stage 3 (moderate): Secondary | ICD-10-CM | POA: Diagnosis not present

## 2014-03-01 DIAGNOSIS — Z87891 Personal history of nicotine dependence: Secondary | ICD-10-CM | POA: Diagnosis not present

## 2014-03-01 DIAGNOSIS — R079 Chest pain, unspecified: Secondary | ICD-10-CM

## 2014-03-01 DIAGNOSIS — Z87738 Personal history of other specified (corrected) congenital malformations of digestive system: Secondary | ICD-10-CM | POA: Diagnosis not present

## 2014-03-01 DIAGNOSIS — R1084 Generalized abdominal pain: Secondary | ICD-10-CM | POA: Insufficient documentation

## 2014-03-01 DIAGNOSIS — R9389 Abnormal findings on diagnostic imaging of other specified body structures: Secondary | ICD-10-CM

## 2014-03-01 DIAGNOSIS — I5042 Chronic combined systolic (congestive) and diastolic (congestive) heart failure: Secondary | ICD-10-CM | POA: Insufficient documentation

## 2014-03-01 DIAGNOSIS — E785 Hyperlipidemia, unspecified: Secondary | ICD-10-CM | POA: Diagnosis not present

## 2014-03-01 DIAGNOSIS — D509 Iron deficiency anemia, unspecified: Secondary | ICD-10-CM | POA: Insufficient documentation

## 2014-03-01 DIAGNOSIS — Z951 Presence of aortocoronary bypass graft: Secondary | ICD-10-CM | POA: Diagnosis not present

## 2014-03-01 DIAGNOSIS — F1410 Cocaine abuse, uncomplicated: Secondary | ICD-10-CM | POA: Diagnosis not present

## 2014-03-01 LAB — PRO B NATRIURETIC PEPTIDE: Pro B Natriuretic peptide (BNP): 7421 pg/mL — ABNORMAL HIGH (ref 0–125)

## 2014-03-01 LAB — CBC
HCT: 25.3 % — ABNORMAL LOW (ref 39.0–52.0)
HEMOGLOBIN: 7.8 g/dL — AB (ref 13.0–17.0)
MCH: 24.9 pg — AB (ref 26.0–34.0)
MCHC: 30.8 g/dL (ref 30.0–36.0)
MCV: 80.8 fL (ref 78.0–100.0)
Platelets: 330 10*3/uL (ref 150–400)
RBC: 3.13 MIL/uL — ABNORMAL LOW (ref 4.22–5.81)
RDW: 19.3 % — ABNORMAL HIGH (ref 11.5–15.5)
WBC: 6.8 10*3/uL (ref 4.0–10.5)

## 2014-03-01 LAB — RAPID URINE DRUG SCREEN, HOSP PERFORMED
AMPHETAMINES: NOT DETECTED
Barbiturates: NOT DETECTED
Benzodiazepines: NOT DETECTED
Cocaine: POSITIVE — AB
OPIATES: NOT DETECTED
Tetrahydrocannabinol: NOT DETECTED

## 2014-03-01 LAB — I-STAT TROPONIN, ED: Troponin i, poc: 0.04 ng/mL (ref 0.00–0.08)

## 2014-03-01 LAB — BASIC METABOLIC PANEL
Anion gap: 16 — ABNORMAL HIGH (ref 5–15)
BUN: 26 mg/dL — ABNORMAL HIGH (ref 6–23)
CALCIUM: 9.1 mg/dL (ref 8.4–10.5)
CO2: 18 meq/L — AB (ref 19–32)
Chloride: 97 mEq/L (ref 96–112)
Creatinine, Ser: 1.19 mg/dL (ref 0.50–1.35)
GFR calc Af Amer: 70 mL/min — ABNORMAL LOW (ref >90)
GFR calc non Af Amer: 60 mL/min — ABNORMAL LOW (ref >90)
GLUCOSE: 98 mg/dL (ref 70–99)
Potassium: 4.5 mEq/L (ref 3.7–5.3)
Sodium: 131 mEq/L — ABNORMAL LOW (ref 137–147)

## 2014-03-01 LAB — I-STAT CHEM 8, ED
BUN: 25 mg/dL — ABNORMAL HIGH (ref 6–23)
Calcium, Ion: 1.16 mmol/L (ref 1.13–1.30)
Chloride: 104 mEq/L (ref 96–112)
Creatinine, Ser: 1.3 mg/dL (ref 0.50–1.35)
Glucose, Bld: 102 mg/dL — ABNORMAL HIGH (ref 70–99)
HEMATOCRIT: 28 % — AB (ref 39.0–52.0)
HEMOGLOBIN: 9.5 g/dL — AB (ref 13.0–17.0)
POTASSIUM: 4.3 meq/L (ref 3.7–5.3)
SODIUM: 135 meq/L — AB (ref 137–147)
TCO2: 18 mmol/L (ref 0–100)

## 2014-03-01 MED ORDER — AZITHROMYCIN 250 MG PO TABS: 250.0000 mg | ORAL_TABLET | Freq: Every day | ORAL | Status: DC

## 2014-03-01 MED ORDER — DEXTROSE 5 % IV SOLN
500.0000 mg | Freq: Once | INTRAVENOUS | Status: AC
  Administered 2014-03-01: 500 mg via INTRAVENOUS
  Filled 2014-03-01: qty 500

## 2014-03-01 NOTE — ED Notes (Signed)
MD at bedside. 

## 2014-03-01 NOTE — Discharge Instructions (Signed)
Pneumonia Pneumonia is an infection of the lungs.  CAUSES Pneumonia may be caused by bacteria or a virus. Usually, these infections are caused by breathing infectious particles into the lungs (respiratory tract). SIGNS AND SYMPTOMS   Cough.  Fever.  Chest pain.  Increased rate of breathing.  Wheezing.  Mucus production. DIAGNOSIS  If you have the common symptoms of pneumonia, your health care provider will typically confirm the diagnosis with a chest X-ray. The X-ray will show an abnormality in the lung (pulmonary infiltrate) if you have pneumonia. Other tests of your blood, urine, or sputum may be done to find the specific cause of your pneumonia. Your health care provider may also do tests (blood gases or pulse oximetry) to see how well your lungs are working. TREATMENT  Some forms of pneumonia may be spread to other people when you cough or sneeze. You may be asked to wear a mask before and during your exam. Pneumonia that is caused by bacteria is treated with antibiotic medicine. Pneumonia that is caused by the influenza virus may be treated with an antiviral medicine. Most other viral infections must run their course. These infections will not respond to antibiotics.  HOME CARE INSTRUCTIONS   Cough suppressants may be used if you are losing too much rest. However, coughing protects you by clearing your lungs. You should avoid using cough suppressants if you can.  Your health care provider may have prescribed medicine if he or she thinks your pneumonia is caused by bacteria or influenza. Finish your medicine even if you start to feel better.  Your health care provider may also prescribe an expectorant. This loosens the mucus to be coughed up.  Take medicines only as directed by your health care provider.  Do not smoke. Smoking is a common cause of bronchitis and can contribute to pneumonia. If you are a smoker and continue to smoke, your cough may last several weeks after your  pneumonia has cleared.  A cold steam vaporizer or humidifier in your room or home may help loosen mucus.  Coughing is often worse at night. Sleeping in a semi-upright position in a recliner or using a couple pillows under your head will help with this.  Get rest as you feel it is needed. Your body will usually let you know when you need to rest. PREVENTION A pneumococcal shot (vaccine) is available to prevent a common bacterial cause of pneumonia. This is usually suggested for:  People over 65 years old.  Patients on chemotherapy.  People with chronic lung problems, such as bronchitis or emphysema.  People with immune system problems. If you are over 65 or have a high risk condition, you may receive the pneumococcal vaccine if you have not received it before. In some countries, a routine influenza vaccine is also recommended. This vaccine can help prevent some cases of pneumonia.You may be offered the influenza vaccine as part of your care. If you smoke, it is time to quit. You may receive instructions on how to stop smoking. Your health care provider can provide medicines and counseling to help you quit. SEEK MEDICAL CARE IF: You have a fever. SEEK IMMEDIATE MEDICAL CARE IF:   Your illness becomes worse. This is especially true if you are elderly or weakened from any other disease.  You cannot control your cough with suppressants and are losing sleep.  You begin coughing up blood.  You develop pain which is getting worse or is uncontrolled with medicines.  Any of the symptoms   which initially brought you in for treatment are getting worse rather than better.  You develop shortness of breath or chest pain. MAKE SURE YOU:   Understand these instructions.  Will watch your condition.  Will get help right away if you are not doing well or get worse. Document Released: 03/06/2005 Document Revised: 07/21/2013 Document Reviewed: 05/26/2010 [facility] Patient Information 2015  [place], [place]. This information is not intended to replace advice given to you by your health care provider. Make sure you discuss any questions you have with your health care provider. Cocaine Cocaine stimulates the central nervous system. As a stimulant, cocaine has the ability to improve athletic performance through increasing speed, endurance, and concentration, as well as decreasing fatigue. Although cocaine may seem to be beneficial for athletics, it is highly addicting and has many debilitating side effects. Cocaine has caused the deaths of many athletes, and its use is banned by every major athletic organization in the world. The clinical effect of cocaine (the high) is very short in duration. Cocaine works in the brain by altering the normal concentrations of chemicals that stimulate the brain cells.  WHY ATHLETES USE IT  Many athletes use cocaine for its central nervous system stimulating properties. It is also used as a recreational drug due to the euphoric felling it produces.  ADVERSE EFFECTS   Sleep disturbances.  Abnormal heart rhythms.  Stroke.  Heart attack.  Seizures.  Elevated blood pressure.  Death.  Paranoia (feeling that people want to hurt you).  Panic attacks (sudden feelings of anxiety or shortness of breath).  Suicidal behavior (wanting to kill yourself).  Homicidal behavior (wanting to kill other people).  Depression (feeling very sad, having decreased energy for activities).  Poor athletic performance. PHARMACOLOGY  Cocaine acts on the body for a short period of time; the clinical effects may last less than1 hour. Since most athletic competitions last for more than 1 hour, cocaine use may not improve athletic performance. The use of cocaine makes individuals much more susceptible for serious conditions such as seizures, arrhythmia (irregular heart beat), and strokes. Even a single dose of cocaine can be detected on a drug test for up to about 30 hours.    PREVENTION Most athletes use cocaine as a recreational drug and not for the purpose of enhancing athletic performance. To prevent the use of cocaine, athletes must be educated on its side effects and the risk of addiction. If an athlete is found using cocaine, counseling and treatment are almost always required.  Document Released: 03/06/2005 Document Revised: 05/29/2011 Document Reviewed: 06/18/2008 [facility] Patient Information 2015 [place], [place]. This information is not intended to replace advice given to you by your health care provider. Make sure you discuss any questions you have with your health care provider.

## 2014-03-01 NOTE — ED Notes (Signed)
Dr Audie Pinto at Bedside.

## 2014-03-01 NOTE — ED Provider Notes (Signed)
CSN: [id number]     Arrival date & time 03/01/14  2155 History   First MD Initiated Contact with Patient 03/01/14 2224     Chief Complaint  Patient presents with  . Chest Pain   HPI  Charles [name] is a 68 year old who presents today with chest pain. He reports eating dinner around 6pm after which he had sudden onset of nausea and vomiting of mucous but no blood or food. He then reports it was associated with chest pain, shortness of breath, weakness, and pain radiating up from calves bilaterally. He reports no changes in his home meds and hasn't missed any doses recently, including his BP meds. Other than a "pinch" of crack he had on Wednesday, he denies any other illicit drug or alcohol use. He also denies any sick contacts, recent illness (though reports being ill for some time).   Past Medical History  Diagnosis Date  . Iron deficiency anemia   . Chronic combined systolic and diastolic CHF, NYHA class 2     a. 11/2013 Echo: EF 40-45%, basl-mid inflat AK, Gr 2 DD.  [name] Hyperlipidemia LDL goal <70   . Obstructive sleep apnea     a. not on home CPAP  . Ischemic cardiomyopathy     a. 11/2013 Echo: EF 40-45%, basl-mid inflat AK, Gr 2 DD, mild AI, mod dil LA/RA, mod reduced RV fxn, PASP 58mmHg.  [name] Homelessness   . Moderate to severe pulmonary hypertension 03/2010    a. PA peak pressure of 76 mmHg (per 2D Echo 03/2010)  . Polysubstance abuse     a. cocaine, THC  . AV malformation of gastrointestinal tract     a. w/ h/o GIB.  [name] Family history of early CAD   . DDD (degenerative disc disease), lumbar   . Peripheral vascular disease   . Seizure disorder   . Hypertension   . CAD (coronary artery disease)     a. s/p 3-vessel CABG (12/2007) // 100% RCA stenosis with collaterals from left system. Severe bifurcation lesions of proxima CXA and OM. Moderate LAD disease - followed by Dr. Wyline Copas in [facility];  b. 11/2013 Myoview: Large fixed inf defect w/o ischemia, EF 35%.  . Diabetes   . Chronic kidney  disease (CKD), stage III (moderate)     a. BL SCr 1.5-1.6  . Renal artery stenosis   . Seizures    Past Surgical History  Procedure Laterality Date  . Apc  03/2010    To treat small bowel AVMs  . Esophagogastroduodenoscopy N/A 08/14/2012    Procedure: ESOPHAGOGASTRODUODENOSCOPY (EGD);  Surgeon: [name], MD;  Location: [facility] ENDOSCOPY;  Service: Endoscopy;  Laterality: N/A;  . Hot hemostasis N/A 08/14/2012    Procedure: HOT HEMOSTASIS (ARGON PLASMA COAGULATION/BICAP);  Surgeon: [name], MD;  Location: [facility] ENDOSCOPY;  Service: Endoscopy;  Laterality: N/A;  . Esophagogastroduodenoscopy (egd) with propofol N/A 08/04/2013    Procedure: ESOPHAGOGASTRODUODENOSCOPY (EGD) WITH PROPOFOL;  Surgeon: [name], MD;  Location: [facility] ENDOSCOPY;  Service: Endoscopy;  Laterality: N/A;  . Coronary artery bypass graft  12/2007   Family History  Problem Relation Age of Onset  . Heart disease Mother     unknown type  . Heart disease Father 60    died of MI at 24yo  . Heart disease Paternal Grandfather 73    died of MI  . Heart disease    . Heart disease Brother 30   History  Substance Use Topics  . Smoking status: Former  Smoker -- 0.25 packs/day for 56 years    Types: Cigarettes  . Smokeless tobacco: Never Used  . Alcohol Use: No     Comment: occasionally drinks, a few times a month    Review of Systems  Constitutional: Negative for fever.  Cardiovascular: Positive for chest pain.  Gastrointestinal: Positive for nausea, vomiting, abdominal pain and diarrhea.  Neurological: Positive for weakness.      Allergies  Motrin and Tylenol  Home Medications   Prior to Admission medications   Medication Sig Start Date End Date Taking? Authorizing Provider  amLODipine (NORVASC) 10 MG tablet Take 10 mg by mouth daily.   Yes Historical Provider, MD  aspirin EC 81 MG tablet Take 81 mg by mouth daily.   Yes Historical Provider, MD  citalopram (CELEXA) 20 MG tablet Take 20 mg by mouth daily.    Yes Historical Provider, MD  cloNIDine (CATAPRES) 0.2 MG tablet Take 0.5 tablets (0.1 mg total) by mouth 2 (two) times daily. 02/09/14  Yes Modena Jansky, MD  docusate sodium (COLACE) 100 MG capsule Take 100 mg by mouth 2 (two) times daily.   Yes Historical Provider, MD  ferrous sulfate 325 (65 FE) MG tablet Take 325 mg by mouth daily.    Yes Historical Provider, MD  furosemide (LASIX) 80 MG tablet Take 80 mg by mouth daily.   Yes Historical Provider, MD  gabapentin (NEURONTIN) 100 MG capsule Take 100 mg by mouth 2 (two) times daily.   Yes Historical Provider, MD  hydrALAZINE (APRESOLINE) 50 MG tablet Take 1.5 tablets (75 mg total) by mouth 3 (three) times daily. 02/09/14  Yes Modena Jansky, MD  isosorbide mononitrate (IMDUR) 30 MG 24 hr tablet Take 30 mg by mouth daily.   Yes Historical Provider, MD  nitroGLYCERIN (NITROSTAT) 0.4 MG SL tablet Place 1 tablet (0.4 mg total) under the tongue every 5 (five) minutes as needed for chest pain (upto max 3 doses at one time.). 02/09/14  Yes Modena Jansky, MD  pantoprazole (PROTONIX) 40 MG tablet Take 40 mg by mouth 2 (two) times daily.   Yes Historical Provider, MD  phenytoin (DILANTIN) 100 MG ER capsule Take 200-300 mg by mouth 2 (two) times daily. 200mg  in the morning, 300mg  in the evening   Yes Historical Provider, MD  pravastatin (PRAVACHOL) 40 MG tablet Take 1 tablet (40 mg total) by mouth daily at 6 PM. 02/09/14  Yes Modena Jansky, MD   BP 179/114 mmHg  Pulse 90  Resp 21  SpO2 100% Physical Exam  Constitutional: He is oriented to person, place, and time. He appears well-developed and well-nourished. No distress.  2L O2 by Amaya  HENT:  Head: Normocephalic and atraumatic.  Mouth/Throat: Oropharynx is clear and moist. No oropharyngeal exudate.  Eyes: Conjunctivae are normal. Pupils are equal, round, and reactive to light.  Cardiovascular: Normal rate, regular rhythm and normal heart sounds.  Exam reveals no gallop and no friction rub.    No murmur heard. Pulmonary/Chest: Effort normal and breath sounds normal. No respiratory distress. He has no wheezes. He has no rales.  Abdominal: Soft. Bowel sounds are normal. He exhibits no distension. There is tenderness. There is no rebound.  Mild generalized tenderness to palpation   Neurological: He is alert and oriented to person, place, and time. No cranial nerve deficit.  Skin: He is not diaphoretic.    ED Course  Procedures (including critical care time) Labs Review Labs Reviewed  CBC  BASIC METABOLIC PANEL  PRO B NATRIURETIC PEPTIDE  URINE RAPID DRUG SCREEN (HOSP PERFORMED)  I-STAT TROPOININ, ED  I-STAT CHEM 8, ED  I-STAT TROPOININ, ED    Imaging Review Dg Chest Port 1 View  03/01/2014   CLINICAL DATA:  Chest pain.  EXAM: PORTABLE CHEST - 1 VIEW  COMPARISON:  February 07, 2014.  FINDINGS: Stable cardiomegaly. Status post coronary artery bypass graft. Mildly increased right basilar opacity is noted concerning for possible pneumonia or subsegmental atelectasis. Left lung is clear. No pneumothorax or pleural effusion is noted. Bony thorax is intact.  IMPRESSION: Mildly increased right basilar opacity concerning for pneumonia or subsegmental atelectasis. Followup radiographs are recommended.   Electronically Signed   By: Sabino Dick M.D.   On: 03/01/2014 22:36     EKG Interpretation None      MDM   Final diagnoses:  Chest pain    His current symptoms may be related to ingestion of illicit substance use as would be consistent with prior admission. If so, chest pain could be concerning for ACS especially given his cardiomyopathy. Will check BMET, CBC, UDS, and troponin.     Charlott Rakes, MD 03/01/14 2252  Dot Lanes, MD 03/01/14 [phone]

## 2014-03-01 NOTE — ED Notes (Signed)
Per EMS pt called due to chest pain about 2 hours ago;pt requesting pain meds; Per EMS pt sleep the whole ride here; requested morphine and dilaudid from EMS; pt has hx of cocaine use; Per EMS pt started feeling weak with nausea/vomitting and cough with thick while mucus for x 2 hours and then chest pain started; Rhonchi in left lower lobe right lobe clear; chest pain rated at 10/10 on arrival

## 2014-03-02 DIAGNOSIS — J159 Unspecified bacterial pneumonia: Secondary | ICD-10-CM | POA: Diagnosis not present

## 2014-03-02 LAB — I-STAT TROPONIN, ED: TROPONIN I, POC: 0.04 ng/mL (ref 0.00–0.08)

## 2014-03-02 NOTE — ED Provider Notes (Signed)
Vital signs remained stable. Patient advised against using cocaine. Troponin 2 are normal.  Julianne Rice, MD 03/02/14 [phone]

## 2014-04-26 ENCOUNTER — Emergency Department (HOSPITAL_COMMUNITY): Payer: Medicare Other

## 2014-04-26 ENCOUNTER — Inpatient Hospital Stay (HOSPITAL_COMMUNITY)
Admission: EM | Admit: 2014-04-26 | Discharge: 2014-04-30 | DRG: 292 | Disposition: A | Discharge status: Home / Self Care | Payer: Medicare Other | Attending: Internal Medicine | Admitting: Internal Medicine

## 2014-04-26 ENCOUNTER — Encounter (HOSPITAL_COMMUNITY): Payer: Self-pay | Admitting: Cardiology

## 2014-04-26 DIAGNOSIS — F1910 Other psychoactive substance abuse, uncomplicated: Secondary | ICD-10-CM | POA: Diagnosis present

## 2014-04-26 DIAGNOSIS — G40909 Epilepsy, unspecified, not intractable, without status epilepticus: Secondary | ICD-10-CM | POA: Diagnosis present

## 2014-04-26 DIAGNOSIS — Z951 Presence of aortocoronary bypass graft: Secondary | ICD-10-CM | POA: Diagnosis not present

## 2014-04-26 DIAGNOSIS — Z9119 Patient's noncompliance with other medical treatment and regimen: Secondary | ICD-10-CM | POA: Diagnosis present

## 2014-04-26 DIAGNOSIS — I5043 Acute on chronic combined systolic (congestive) and diastolic (congestive) heart failure: Secondary | ICD-10-CM | POA: Diagnosis present

## 2014-04-26 DIAGNOSIS — I272 Other secondary pulmonary hypertension: Secondary | ICD-10-CM | POA: Diagnosis present

## 2014-04-26 DIAGNOSIS — I509 Heart failure, unspecified: Secondary | ICD-10-CM | POA: Insufficient documentation

## 2014-04-26 DIAGNOSIS — I5031 Acute diastolic (congestive) heart failure: Secondary | ICD-10-CM | POA: Diagnosis not present

## 2014-04-26 DIAGNOSIS — I739 Peripheral vascular disease, unspecified: Secondary | ICD-10-CM | POA: Diagnosis present

## 2014-04-26 DIAGNOSIS — E785 Hyperlipidemia, unspecified: Secondary | ICD-10-CM | POA: Diagnosis present

## 2014-04-26 DIAGNOSIS — I2510 Atherosclerotic heart disease of native coronary artery without angina pectoris: Secondary | ICD-10-CM | POA: Diagnosis present

## 2014-04-26 DIAGNOSIS — R079 Chest pain, unspecified: Secondary | ICD-10-CM | POA: Diagnosis present

## 2014-04-26 DIAGNOSIS — Z87891 Personal history of nicotine dependence: Secondary | ICD-10-CM | POA: Diagnosis not present

## 2014-04-26 DIAGNOSIS — I270 Primary pulmonary hypertension: Secondary | ICD-10-CM

## 2014-04-26 DIAGNOSIS — G4733 Obstructive sleep apnea (adult) (pediatric): Secondary | ICD-10-CM | POA: Diagnosis present

## 2014-04-26 DIAGNOSIS — I255 Ischemic cardiomyopathy: Secondary | ICD-10-CM | POA: Diagnosis present

## 2014-04-26 DIAGNOSIS — R109 Unspecified abdominal pain: Secondary | ICD-10-CM

## 2014-04-26 DIAGNOSIS — M5136 Other intervertebral disc degeneration, lumbar region: Secondary | ICD-10-CM | POA: Diagnosis present

## 2014-04-26 DIAGNOSIS — I129 Hypertensive chronic kidney disease with stage 1 through stage 4 chronic kidney disease, or unspecified chronic kidney disease: Secondary | ICD-10-CM | POA: Diagnosis present

## 2014-04-26 DIAGNOSIS — N183 Chronic kidney disease, stage 3 (moderate): Secondary | ICD-10-CM | POA: Diagnosis present

## 2014-04-26 DIAGNOSIS — F1410 Cocaine abuse, uncomplicated: Secondary | ICD-10-CM | POA: Diagnosis present

## 2014-04-26 DIAGNOSIS — R188 Other ascites: Secondary | ICD-10-CM | POA: Diagnosis present

## 2014-04-26 DIAGNOSIS — Z7982 Long term (current) use of aspirin: Secondary | ICD-10-CM

## 2014-04-26 LAB — HEPATIC FUNCTION PANEL
ALT: 30 U/L (ref 0–53)
AST: 42 U/L — ABNORMAL HIGH (ref 0–37)
Albumin: 3 g/dL — ABNORMAL LOW (ref 3.5–5.2)
Alkaline Phosphatase: 87 U/L (ref 39–117)
BILIRUBIN DIRECT: 0.5 mg/dL (ref 0.0–0.5)
BILIRUBIN TOTAL: 1.2 mg/dL (ref 0.3–1.2)
Indirect Bilirubin: 0.7 mg/dL (ref 0.3–0.9)
TOTAL PROTEIN: 7.2 g/dL (ref 6.0–8.3)

## 2014-04-26 LAB — RAPID URINE DRUG SCREEN, HOSP PERFORMED
Amphetamines: NOT DETECTED
BARBITURATES: NOT DETECTED
Benzodiazepines: NOT DETECTED
COCAINE: POSITIVE — AB
Opiates: NOT DETECTED
TETRAHYDROCANNABINOL: NOT DETECTED

## 2014-04-26 LAB — CBC
HCT: 28.6 % — ABNORMAL LOW (ref 39.0–52.0)
Hemoglobin: 8.7 g/dL — ABNORMAL LOW (ref 13.0–17.0)
MCH: 25.1 pg — ABNORMAL LOW (ref 26.0–34.0)
MCHC: 30.4 g/dL (ref 30.0–36.0)
MCV: 82.7 fL (ref 78.0–100.0)
Platelets: 310 10*3/uL (ref 150–400)
RBC: 3.46 MIL/uL — ABNORMAL LOW (ref 4.22–5.81)
RDW: 18.4 % — AB (ref 11.5–15.5)
WBC: 5.4 10*3/uL (ref 4.0–10.5)

## 2014-04-26 LAB — BASIC METABOLIC PANEL
ANION GAP: 6 (ref 5–15)
BUN: 18 mg/dL (ref 6–23)
CHLORIDE: 105 mmol/L (ref 96–112)
CO2: 25 mmol/L (ref 19–32)
Calcium: 8.3 mg/dL — ABNORMAL LOW (ref 8.4–10.5)
Creatinine, Ser: 1.13 mg/dL (ref 0.50–1.35)
GFR calc Af Amer: 74 mL/min — ABNORMAL LOW (ref >90)
GFR, EST NON AFRICAN AMERICAN: 64 mL/min — AB (ref >90)
Glucose, Bld: 98 mg/dL (ref 70–99)
Potassium: 4.1 mmol/L (ref 3.5–5.1)
Sodium: 136 mmol/L (ref 135–145)

## 2014-04-26 LAB — TROPONIN I: TROPONIN I: 0.25 ng/mL — AB (ref <0.031)

## 2014-04-26 LAB — PHENYTOIN LEVEL, TOTAL

## 2014-04-26 LAB — BRAIN NATRIURETIC PEPTIDE: B Natriuretic Peptide: 2313.3 pg/mL — ABNORMAL HIGH (ref 0.0–100.0)

## 2014-04-26 LAB — I-STAT TROPONIN, ED
Troponin i, poc: 0.02 ng/mL (ref 0.00–0.08)
Troponin i, poc: 0.02 ng/mL (ref 0.00–0.08)

## 2014-04-26 LAB — LIPASE, BLOOD: LIPASE: 41 U/L (ref 11–59)

## 2014-04-26 MED ORDER — PANTOPRAZOLE SODIUM 40 MG PO TBEC
40.0000 mg | DELAYED_RELEASE_TABLET | Freq: Every day | ORAL | Status: DC
  Administered 2014-04-27 – 2014-04-30 (×4): 40 mg via ORAL
  Filled 2014-04-26 (×3): qty 1

## 2014-04-26 MED ORDER — ALBUTEROL SULFATE (2.5 MG/3ML) 0.083% IN NEBU: 2.5000 mg | INHALATION_SOLUTION | RESPIRATORY_TRACT | Status: DC | PRN

## 2014-04-26 MED ORDER — ACETAMINOPHEN 325 MG PO TABS: 650.0000 mg | ORAL_TABLET | ORAL | Status: DC | PRN

## 2014-04-26 MED ORDER — ISOSORBIDE MONONITRATE ER 30 MG PO TB24
30.0000 mg | ORAL_TABLET | Freq: Every day | ORAL | Status: DC
  Administered 2014-04-26 – 2014-04-30 (×5): 30 mg via ORAL
  Filled 2014-04-26 (×5): qty 1

## 2014-04-26 MED ORDER — OXYCODONE HCL 5 MG PO TABS
5.0000 mg | ORAL_TABLET | Freq: Four times a day (QID) | ORAL | Status: DC | PRN
  Administered 2014-04-26 – 2014-04-27 (×2): 5 mg via ORAL
  Filled 2014-04-26 (×4): qty 1

## 2014-04-26 MED ORDER — PHENYTOIN SODIUM EXTENDED 100 MG PO CAPS
300.0000 mg | ORAL_CAPSULE | Freq: Every day | ORAL | Status: DC
  Administered 2014-04-26 – 2014-04-29 (×4): 300 mg via ORAL
  Filled 2014-04-26 (×5): qty 3

## 2014-04-26 MED ORDER — AMLODIPINE BESYLATE 10 MG PO TABS
10.0000 mg | ORAL_TABLET | Freq: Every day | ORAL | Status: DC
  Administered 2014-04-26 – 2014-04-30 (×5): 10 mg via ORAL
  Filled 2014-04-26 (×5): qty 1

## 2014-04-26 MED ORDER — CITALOPRAM HYDROBROMIDE 20 MG PO TABS
20.0000 mg | ORAL_TABLET | Freq: Every day | ORAL | Status: DC
  Administered 2014-04-27 – 2014-04-30 (×4): 20 mg via ORAL
  Filled 2014-04-26 (×4): qty 1

## 2014-04-26 MED ORDER — ASPIRIN EC 81 MG PO TBEC
81.0000 mg | DELAYED_RELEASE_TABLET | Freq: Every day | ORAL | Status: DC
  Administered 2014-04-26 – 2014-04-30 (×5): 81 mg via ORAL
  Filled 2014-04-26 (×5): qty 1

## 2014-04-26 MED ORDER — FUROSEMIDE 10 MG/ML IJ SOLN
40.0000 mg | Freq: Two times a day (BID) | INTRAMUSCULAR | Status: DC
  Administered 2014-04-26 – 2014-04-30 (×8): 40 mg via INTRAVENOUS
  Filled 2014-04-26 (×9): qty 4

## 2014-04-26 MED ORDER — SODIUM CHLORIDE 0.9 % IV SOLN: 250.0000 mL | INTRAVENOUS | Status: DC | PRN

## 2014-04-26 MED ORDER — PHENYTOIN SODIUM EXTENDED 100 MG PO CAPS: 200.0000 mg | ORAL_CAPSULE | Freq: Two times a day (BID) | ORAL | Status: DC

## 2014-04-26 MED ORDER — HYDRALAZINE HCL 25 MG PO TABS
75.0000 mg | ORAL_TABLET | Freq: Three times a day (TID) | ORAL | Status: DC
  Administered 2014-04-26 – 2014-04-30 (×10): 75 mg via ORAL
  Filled 2014-04-26 (×14): qty 1

## 2014-04-26 MED ORDER — SODIUM CHLORIDE 0.9 % IJ SOLN: 3.0000 mL | INTRAMUSCULAR | Status: DC | PRN

## 2014-04-26 MED ORDER — PHENYTOIN SODIUM EXTENDED 100 MG PO CAPS
200.0000 mg | ORAL_CAPSULE | Freq: Every day | ORAL | Status: DC
  Administered 2014-04-27 – 2014-04-30 (×4): 200 mg via ORAL
  Filled 2014-04-26 (×4): qty 2

## 2014-04-26 MED ORDER — IOHEXOL 350 MG/ML SOLN
100.0000 mL | Freq: Once | INTRAVENOUS | Status: AC | PRN
  Administered 2014-04-26: 100 mL via INTRAVENOUS

## 2014-04-26 MED ORDER — HEPARIN SODIUM (PORCINE) 5000 UNIT/ML IJ SOLN
5000.0000 [IU] | Freq: Three times a day (TID) | INTRAMUSCULAR | Status: DC
  Filled 2014-04-26 (×12): qty 1

## 2014-04-26 MED ORDER — ONDANSETRON HCL 4 MG/2ML IJ SOLN: 4.0000 mg | Freq: Four times a day (QID) | INTRAMUSCULAR | Status: DC | PRN

## 2014-04-26 MED ORDER — SODIUM CHLORIDE 0.9 % IJ SOLN
3.0000 mL | Freq: Two times a day (BID) | INTRAMUSCULAR | Status: DC
  Administered 2014-04-26 – 2014-04-29 (×6): 3 mL via INTRAVENOUS

## 2014-04-26 MED ORDER — HYDROMORPHONE HCL 1 MG/ML IJ SOLN
0.5000 mg | Freq: Once | INTRAMUSCULAR | Status: AC
Start: 2014-04-26 — End: 2014-04-26
  Administered 2014-04-26: 0.5 mg via INTRAVENOUS
  Filled 2014-04-26: qty 1

## 2014-04-26 MED ORDER — MORPHINE SULFATE 2 MG/ML IJ SOLN
1.0000 mg | INTRAMUSCULAR | Status: DC | PRN
  Administered 2014-04-27 – 2014-04-30 (×9): 1 mg via INTRAVENOUS
  Filled 2014-04-26 (×9): qty 1

## 2014-04-26 MED ORDER — PRAVASTATIN SODIUM 40 MG PO TABS
40.0000 mg | ORAL_TABLET | Freq: Every day | ORAL | Status: DC
  Administered 2014-04-26 – 2014-04-29 (×4): 40 mg via ORAL
  Filled 2014-04-26 (×5): qty 1

## 2014-04-26 MED ORDER — CLONIDINE HCL 0.1 MG PO TABS
0.1000 mg | ORAL_TABLET | Freq: Two times a day (BID) | ORAL | Status: DC
  Administered 2014-04-26 – 2014-04-30 (×8): 0.1 mg via ORAL
  Filled 2014-04-26 (×9): qty 1

## 2014-04-26 MED ORDER — HYDROMORPHONE HCL 1 MG/ML IJ SOLN
0.5000 mg | Freq: Once | INTRAMUSCULAR | Status: AC
  Administered 2014-04-26: 0.5 mg via INTRAVENOUS
  Filled 2014-04-26: qty 1

## 2014-04-26 MED ORDER — FERROUS SULFATE 325 (65 FE) MG PO TABS
325.0000 mg | ORAL_TABLET | Freq: Two times a day (BID) | ORAL | Status: DC
  Administered 2014-04-27 – 2014-04-30 (×7): 325 mg via ORAL
  Filled 2014-04-26 (×10): qty 1

## 2014-04-26 MED ORDER — FUROSEMIDE 10 MG/ML IJ SOLN
40.0000 mg | Freq: Once | INTRAMUSCULAR | Status: AC
  Administered 2014-04-26: 40 mg via INTRAVENOUS
  Filled 2014-04-26: qty 4

## 2014-04-26 MED ORDER — NITROGLYCERIN 0.4 MG SL SUBL: 0.4000 mg | SUBLINGUAL_TABLET | SUBLINGUAL | Status: DC | PRN

## 2014-04-26 MED ORDER — POTASSIUM CHLORIDE CRYS ER 20 MEQ PO TBCR
20.0000 meq | EXTENDED_RELEASE_TABLET | Freq: Two times a day (BID) | ORAL | Status: DC
  Administered 2014-04-26 – 2014-04-30 (×8): 20 meq via ORAL
  Filled 2014-04-26 (×9): qty 1

## 2014-04-26 NOTE — ED Provider Notes (Signed)
CSN: [id number]     Arrival date & time 04/26/14  0845 History   First MD Initiated Contact with Patient 04/26/14 [id number]     Chief Complaint  Patient presents with  . Chest Pain     (Consider location/radiation/quality/duration/timing/severity/associated sxs/prior Treatment) Patient is a 69 y.o. male presenting with chest pain. The history is provided by the patient.  Chest Pain Pain location:  L chest Pain quality: sharp   Pain radiates to:  Does not radiate Pain radiates to the back: no   Pain severity:  Moderate Onset quality:  Gradual Duration:  5 hours Timing:  Constant Progression:  Unchanged Chronicity:  New Context: at rest   Relieved by:  Nothing Worsened by:  Nothing tried Ineffective treatments:  None tried Associated symptoms: abdominal pain, shortness of breath and weakness (generalized)   Associated symptoms: no cough, no fever, no headache, no nausea, no numbness and not vomiting   Abdominal pain:    Location:  Generalized   Quality:  Aching   Past Medical History  Diagnosis Date  . Iron deficiency anemia   . Chronic combined systolic and diastolic CHF, NYHA class 2     a. 11/2013 Echo: EF 40-45%, basl-mid inflat AK, Gr 2 DD.  [name] Hyperlipidemia LDL goal <70   . Obstructive sleep apnea     a. not on home CPAP  . Ischemic cardiomyopathy     a. 11/2013 Echo: EF 40-45%, basl-mid inflat AK, Gr 2 DD, mild AI, mod dil LA/RA, mod reduced RV fxn, PASP 66mmHg.  [name] Homelessness   . Moderate to severe pulmonary hypertension 03/2010    a. PA peak pressure of 76 mmHg (per 2D Echo 03/2010)  . Polysubstance abuse     a. cocaine, THC  . AV malformation of gastrointestinal tract     a. w/ h/o GIB.  [name] Family history of early CAD   . DDD (degenerative disc disease), lumbar   . Peripheral vascular disease   . Seizure disorder   . Hypertension   . CAD (coronary artery disease)     a. s/p 3-vessel CABG (12/2007) // 100% RCA stenosis with collaterals from left system. Severe  bifurcation lesions of proxima CXA and OM. Moderate LAD disease - followed by Dr. Wyline Copas in [facility];  b. 11/2013 Myoview: Large fixed inf defect w/o ischemia, EF 35%.  . Diabetes   . Chronic kidney disease (CKD), stage III (moderate)     a. BL SCr 1.5-1.6  . Renal artery stenosis   . Seizures    Past Surgical History  Procedure Laterality Date  . Apc  03/2010    To treat small bowel AVMs  . Esophagogastroduodenoscopy N/A 08/14/2012    Procedure: ESOPHAGOGASTRODUODENOSCOPY (EGD);  Surgeon: [name], MD;  Location: [facility] ENDOSCOPY;  Service: Endoscopy;  Laterality: N/A;  . Hot hemostasis N/A 08/14/2012    Procedure: HOT HEMOSTASIS (ARGON PLASMA COAGULATION/BICAP);  Surgeon: [name], MD;  Location: [facility] ENDOSCOPY;  Service: Endoscopy;  Laterality: N/A;  . Esophagogastroduodenoscopy (egd) with propofol N/A 08/04/2013    Procedure: ESOPHAGOGASTRODUODENOSCOPY (EGD) WITH PROPOFOL;  Surgeon: [name], MD;  Location: [facility] ENDOSCOPY;  Service: Endoscopy;  Laterality: N/A;  . Coronary artery bypass graft  12/2007   Family History  Problem Relation Age of Onset  . Heart disease Mother     unknown type  . Heart disease Father 82    died of MI at 53yo  . Heart disease Paternal Grandfather 19    died of MI  .  Heart disease    . Heart disease Brother 30   History  Substance Use Topics  . Smoking status: Former Smoker -- 0.25 packs/day for 56 years    Types: Cigarettes  . Smokeless tobacco: Never Used  . Alcohol Use: No     Comment: occasionally drinks, a few times a month    Review of Systems  Constitutional: Negative for fever.  HENT: Negative for drooling and rhinorrhea.   Eyes: Negative for pain.  Respiratory: Positive for shortness of breath. Negative for cough.   Cardiovascular: Positive for chest pain and leg swelling.  Gastrointestinal: Positive for abdominal pain. Negative for nausea, vomiting and diarrhea.  Genitourinary: Negative for dysuria and hematuria.   Musculoskeletal: Negative for gait problem and neck pain.  Skin: Negative for color change.  Neurological: Positive for weakness (generalized). Negative for numbness and headaches.  Hematological: Negative for adenopathy.  Psychiatric/Behavioral: Negative for behavioral problems.  All other systems reviewed and are negative.     Allergies  Motrin and Tylenol  Home Medications   Prior to Admission medications   Medication Sig Start Date End Date Taking? Authorizing Provider  amLODipine (NORVASC) 10 MG tablet Take 10 mg by mouth daily.    Historical Provider, MD  aspirin EC 81 MG tablet Take 81 mg by mouth daily.    Historical Provider, MD  azithromycin (ZITHROMAX) 250 MG tablet Take 1 tablet (250 mg total) by mouth daily. Take 1 tablet a day until gone 03/01/14   Dot Lanes, MD  citalopram (CELEXA) 20 MG tablet Take 20 mg by mouth daily.    Historical Provider, MD  cloNIDine (CATAPRES) 0.2 MG tablet Take 0.5 tablets (0.1 mg total) by mouth 2 (two) times daily. 02/09/14   Modena Jansky, MD  docusate sodium (COLACE) 100 MG capsule Take 100 mg by mouth 2 (two) times daily.    Historical Provider, MD  ferrous sulfate 325 (65 FE) MG tablet Take 325 mg by mouth daily.     Historical Provider, MD  furosemide (LASIX) 80 MG tablet Take 80 mg by mouth daily.    Historical Provider, MD  gabapentin (NEURONTIN) 100 MG capsule Take 100 mg by mouth 2 (two) times daily.    Historical Provider, MD  hydrALAZINE (APRESOLINE) 50 MG tablet Take 1.5 tablets (75 mg total) by mouth 3 (three) times daily. 02/09/14   Modena Jansky, MD  isosorbide mononitrate (IMDUR) 30 MG 24 hr tablet Take 30 mg by mouth daily.    Historical Provider, MD  nitroGLYCERIN (NITROSTAT) 0.4 MG SL tablet Place 1 tablet (0.4 mg total) under the tongue every 5 (five) minutes as needed for chest pain (upto max 3 doses at one time.). 02/09/14   Modena Jansky, MD  pantoprazole (PROTONIX) 40 MG tablet Take 40 mg by mouth 2  (two) times daily.    Historical Provider, MD  phenytoin (DILANTIN) 100 MG ER capsule Take 200-300 mg by mouth 2 (two) times daily. 200mg  in the morning, 300mg  in the evening    Historical Provider, MD  pravastatin (PRAVACHOL) 40 MG tablet Take 1 tablet (40 mg total) by mouth daily at 6 PM. 02/09/14   Modena Jansky, MD   BP 171/93 mmHg  Pulse 78  Temp(Src) 97.9 F (36.6 C) (Oral)  Resp 18  Wt 178 lb (80.74 kg)  SpO2 98% Physical Exam  Constitutional: He is oriented to person, place, and time. He appears well-developed and well-nourished.  HENT:  Head: Normocephalic and atraumatic.  Right  Ear: External ear normal.  Left Ear: External ear normal.  Nose: Nose normal.  Mouth/Throat: Oropharynx is clear and moist. No oropharyngeal exudate.  Eyes: Conjunctivae and EOM are normal. Pupils are equal, round, and reactive to light.  Neck: Normal range of motion. Neck supple.  Cardiovascular: Normal rate, regular rhythm, normal heart sounds and intact distal pulses.  Exam reveals no gallop and no friction rub.   No murmur heard. Pulmonary/Chest: He is in respiratory distress (mild tachypnea). He has no wheezes.  Abdominal: Soft. Bowel sounds are normal. He exhibits distension. There is tenderness. There is no rebound and no guarding.  Mild diffuse tenderness to palpation of abdomen.  Musculoskeletal: Normal range of motion. He exhibits edema (mild pitting edema and distal lower extremities.). He exhibits no tenderness.  Neurological: He is alert and oriented to person, place, and time.  Skin: Skin is warm and dry.  Psychiatric: He has a normal mood and affect. His behavior is normal.  Nursing note and vitals reviewed.   ED Course  Procedures (including critical care time) Labs Review Labs Reviewed  CBC - Abnormal; Notable for the following:    RBC 3.46 (*)    Hemoglobin 8.7 (*)    HCT 28.6 (*)    MCH 25.1 (*)    RDW 18.4 (*)    All other components within normal limits  BASIC  METABOLIC PANEL - Abnormal; Notable for the following:    Calcium 8.3 (*)    GFR calc non Af Amer 64 (*)    GFR calc Af Amer 74 (*)    All other components within normal limits  BRAIN NATRIURETIC PEPTIDE - Abnormal; Notable for the following:    B Natriuretic Peptide 2313.3 (*)    All other components within normal limits  HEPATIC FUNCTION PANEL - Abnormal; Notable for the following:    Albumin 3.0 (*)    AST 42 (*)    All other components within normal limits  URINE RAPID DRUG SCREEN (HOSP PERFORMED) - Abnormal; Notable for the following:    Cocaine POSITIVE (*)    All other components within normal limits  LIPASE, BLOOD  URINALYSIS, ROUTINE W REFLEX MICROSCOPIC  I-STAT TROPOININ, ED  I-STAT TROPOININ, ED    Imaging Review Dg Chest 2 View  04/26/2014   CLINICAL DATA:  Left-sided chest pain and shortness of breath since this morning. Bilateral leg swelling 3 days.  EXAM: CHEST  2 VIEW  COMPARISON:  03/01/2014 and 01/01/2014  FINDINGS: Sternotomy wires unchanged. Lungs are adequately inflated without focal consolidation. Mild prominence of the perihilar markings unchanged likely mild degree of vascular congestion. Possible small amount left pleural fluid. Moderate stable cardiomegaly. Remainder of the exam is unchanged.  IMPRESSION: Stable cardiomegaly with suggestion of minimal vascular congestion. Possible small amount left pleural fluid.   Electronically Signed   By: Marin Olp M.D.   On: 04/26/2014 09:45   Ct Angio Chest Aorta W/cm &/or Wo/cm  04/26/2014   CLINICAL DATA:  Left-sided chest and. Rule out dissection. Initial encounter. Left leg swelling. Abdominal bloating. Diabetes. Coronary artery disease.  EXAM: CT ANGIOGRAPHY CHEST, ABDOMEN AND PELVIS  TECHNIQUE: Multidetector CT imaging through the chest, abdomen and pelvis was performed using the standard protocol during bolus administration of intravenous contrast. Multiplanar reconstructed images and MIPs were obtained and reviewed  to evaluate the vascular anatomy.  CONTRAST:  11mL OMNIPAQUE IOHEXOL 350 MG/ML SOLN  COMPARISON:  Chest radiograph of 04/26/2014. Chest CT 10/23/2013. Abdominal pelvic CT of 07/16/2013.  FINDINGS: CT CHEST FINDINGS  Mediastinum/Nodes: Mild bilateral gynecomastia.  Normal caliber the great vessels, with mild atherosclerosis within. Prior median sternotomy for CABG. No evidence of thoracic aortic dissection. Ascending aorta measures 4.4 cm at the sinuses of Valsalva, 3.2 cm at the sino-tubular junction, and 3.8 cm at the mid ascending segment. Felt to be similar.  Moderate cardiomegaly, without pericardial effusion. Pulmonary artery is enlarged, with the outflow tract measuring 4.1 cm. No pulmonary embolism identified.  Mild thoracic adenopathy. Example 1.6 cm precarinal node, unchanged. This is unchanged since the prior exam. No hilar adenopathy.  Lungs/Pleura: Mild motion degradation. Poor inspiratory effort. Mild mosaic attenuation which could relate to air trapping or mild pulmonary venous congestion.  Small left greater than right pleural effusions, increased since 10/23/2013.  Musculoskeletal: No acute osseous abnormality.  CT ABDOMEN PELVIS FINDINGS  Hepatobiliary: Mildly irregular hepatic capsule, suggesting cirrhosis. Normal gallbladder and biliary tract.  Motion degradation continues into the abdomen.  Pancreas: No pancreatic duct dilatation or focal mass. There is interstitial thickening in the peripancreatic space, including on image 143.  Spleen: Normal  Adrenals/Urinary Tract: Normal adrenal glands. Interpolar left renal lesion measures 1.4 cm and greater than fluid density on image 138. This was present back to 03/24/2010, and similar in size on 07/16/2013. Normal right kidney, without hydronephrosis.  Bladder wall is thickened.  High density material within.  Stomach/Bowel: Proximal gastric underdistention. Normal colon, appendix, and terminal ileum. Normal small bowel.  Vascular/Lymphatic: Abdominal  aorta is normal in caliber, with atherosclerosis within. Atherosclerotic irregularity at the origin of both renal arteries, without hemodynamically significant stenosis. The inferior mesenteric artery is patent.  The left internal iliac is significantly narrowed at its origin. No abdominopelvic adenopathy.  Reproductive: Normal prostate.  Other: Small fat containing left inguinal hernia. Small volume abdominal pelvic ascites.  Diffuse anasarca  Musculoskeletal: Degenerative disc disease at the L4-5 level. Multiple lumbosacral disc bulges. S-shaped thoracolumbar spine curvature.  Review of the MIP images confirms the above findings.  IMPRESSION: CT CHEST IMPRESSION  1. Mild motion degradation. 2. No evidence of thoracic aortic dissection. Mild ascending aortic prominence, as detailed above. Recommend annual imaging followup by CTA or MRA. This recommendation follows 2010 ACCF/AHA/AATS/ACR/ASA/SCA/SCAI/SIR/STS/SVM Guidelines for the Diagnosis and Management of Patients With Thoracic Aortic Disease. Circulation. 2010; 121: J287-O676 3. Probable congestive heart failure, with bilateral pleural effusions. 4. Pulmonary artery enlargement suggests pulmonary arterial hypertension. 5. Similar mediastinal adenopathy. This could be related to congestive heart failure. Recommend attention on follow-up.  CT ABDOMEN AND PELVIS IMPRESSION  1. Mild motion degradation. 2. Advanced aortic atherosclerosis, without dissection or aneurysm. 3. Abdominal pelvic ascites.  Suspect mild cirrhosis. 4. Peripancreatic interstitial thickening. Cannot exclude pancreatitis. This appearance is nonspecific in the setting of ascites. 5. Pericystic edema. This could represent cystitis. Increased density within the urinary bladder could represent Early contrast excretion or hemorrhage. Consider correlation with urinalysis.   Electronically Signed   By: Abigail Miyamoto M.D.   On: 04/26/2014 11:44   Ct Cta Abd/pel W/cm &/or W/o Cm  04/26/2014   CLINICAL  DATA:  Left-sided chest and. Rule out dissection. Initial encounter. Left leg swelling. Abdominal bloating. Diabetes. Coronary artery disease.  EXAM: CT ANGIOGRAPHY CHEST, ABDOMEN AND PELVIS  TECHNIQUE: Multidetector CT imaging through the chest, abdomen and pelvis was performed using the standard protocol during bolus administration of intravenous contrast. Multiplanar reconstructed images and MIPs were obtained and reviewed to evaluate the vascular anatomy.  CONTRAST:  118mL OMNIPAQUE IOHEXOL 350 MG/ML SOLN  COMPARISON:  Chest radiograph of 04/26/2014. Chest CT 10/23/2013. Abdominal pelvic CT of 07/16/2013.  FINDINGS: CT CHEST FINDINGS  Mediastinum/Nodes: Mild bilateral gynecomastia.  Normal caliber the great vessels, with mild atherosclerosis within. Prior median sternotomy for CABG. No evidence of thoracic aortic dissection. Ascending aorta measures 4.4 cm at the sinuses of Valsalva, 3.2 cm at the sino-tubular junction, and 3.8 cm at the mid ascending segment. Felt to be similar.  Moderate cardiomegaly, without pericardial effusion. Pulmonary artery is enlarged, with the outflow tract measuring 4.1 cm. No pulmonary embolism identified.  Mild thoracic adenopathy. Example 1.6 cm precarinal node, unchanged. This is unchanged since the prior exam. No hilar adenopathy.  Lungs/Pleura: Mild motion degradation. Poor inspiratory effort. Mild mosaic attenuation which could relate to air trapping or mild pulmonary venous congestion.  Small left greater than right pleural effusions, increased since 10/23/2013.  Musculoskeletal: No acute osseous abnormality.  CT ABDOMEN PELVIS FINDINGS  Hepatobiliary: Mildly irregular hepatic capsule, suggesting cirrhosis. Normal gallbladder and biliary tract.  Motion degradation continues into the abdomen.  Pancreas: No pancreatic duct dilatation or focal mass. There is interstitial thickening in the peripancreatic space, including on image 143.  Spleen: Normal  Adrenals/Urinary Tract:  Normal adrenal glands. Interpolar left renal lesion measures 1.4 cm and greater than fluid density on image 138. This was present back to 03/24/2010, and similar in size on 07/16/2013. Normal right kidney, without hydronephrosis.  Bladder wall is thickened.  High density material within.  Stomach/Bowel: Proximal gastric underdistention. Normal colon, appendix, and terminal ileum. Normal small bowel.  Vascular/Lymphatic: Abdominal aorta is normal in caliber, with atherosclerosis within. Atherosclerotic irregularity at the origin of both renal arteries, without hemodynamically significant stenosis. The inferior mesenteric artery is patent.  The left internal iliac is significantly narrowed at its origin. No abdominopelvic adenopathy.  Reproductive: Normal prostate.  Other: Small fat containing left inguinal hernia. Small volume abdominal pelvic ascites.  Diffuse anasarca  Musculoskeletal: Degenerative disc disease at the L4-5 level. Multiple lumbosacral disc bulges. S-shaped thoracolumbar spine curvature.  Review of the MIP images confirms the above findings.  IMPRESSION: CT CHEST IMPRESSION  1. Mild motion degradation. 2. No evidence of thoracic aortic dissection. Mild ascending aortic prominence, as detailed above. Recommend annual imaging followup by CTA or MRA. This recommendation follows 2010 ACCF/AHA/AATS/ACR/ASA/SCA/SCAI/SIR/STS/SVM Guidelines for the Diagnosis and Management of Patients With Thoracic Aortic Disease. Circulation. 2010; 121: C585-I778 3. Probable congestive heart failure, with bilateral pleural effusions. 4. Pulmonary artery enlargement suggests pulmonary arterial hypertension. 5. Similar mediastinal adenopathy. This could be related to congestive heart failure. Recommend attention on follow-up.  CT ABDOMEN AND PELVIS IMPRESSION  1. Mild motion degradation. 2. Advanced aortic atherosclerosis, without dissection or aneurysm. 3. Abdominal pelvic ascites.  Suspect mild cirrhosis. 4. Peripancreatic  interstitial thickening. Cannot exclude pancreatitis. This appearance is nonspecific in the setting of ascites. 5. Pericystic edema. This could represent cystitis. Increased density within the urinary bladder could represent Early contrast excretion or hemorrhage. Consider correlation with urinalysis.   Electronically Signed   By: Abigail Miyamoto M.D.   On: 04/26/2014 11:44     EKG Interpretation   Date/Time:  Sunday April 26 2014 08:48:30 EST Ventricular Rate:  69 PR Interval:  172 QRS Duration: 110 QT Interval:  446 QTC Calculation: 477 R Axis:   86 Text Interpretation:  Normal sinus rhythm Nonspecific ST and T wave  abnormality mildly prolonged QT Otherwise no significant change Confirmed  by Anjelo Pullman  MD, Torii Royse ([id number]) on 04/26/2014 9:04:35 AM  MDM   Final diagnoses:  Chest pain  Abdominal pain  CHF exacerbation    9:24 AM 69 y.o. male w hx of CHF, pulm htn, seizures on dilantin, CAD s/p CABG, DM, CKD, polysubs abuse who pw chest pain, shortness of breath, no abdominal bloating which began this morning around 5 AM. He also notes increasing edema in his lower extremities. He denies any drug use in the last few days but states that if we perform a drug screen may be positive due to drug use last week. He is afebrile and mildly hypertensive here. He appears well on exam. CP is sharp, left-sided, nonpleuritic. Will get screening lab work and symptom control.  Most recent nuclear medicine study September 2015, negative for ischemia.  Delta trop neg and pt was given 40mg  lasix. He continues to appear sob w/ elevated RR. Will admit to hospitalist.   Pamella Pert, MD 04/26/14 [phone]

## 2014-04-26 NOTE — ED Notes (Signed)
Pt reports swelling to legs and chest pain over the past couple of days. Also having SOB. Pt reports cardiologist is in [place].

## 2014-04-26 NOTE — ED Notes (Signed)
Report called  

## 2014-04-26 NOTE — ED Notes (Signed)
Pt leaving for x-ray at this time.

## 2014-04-26 NOTE — H&P (Signed)
PATIENT DETAILS Name: Charles [name] Age: 69 y.o. Sex: male Date of Birth: 12/09/1945 Admit Date: 04/26/2014 [id number], Jenny Reichmann   CHIEF COMPLAINT:  Shortness of breath for the past 3 days  HPI: Charles [name] is a 69 y.o. male with a Past Medical History of chronic combined diastolic/systolic heart failure, ischemic cardiomyopathy, dyslipidemia, ongoing cocaine use who presents today with the above noted complaint. Per patient, for the past 3 days he has had significant worsening of his shortness of breath, patient claims that even walking around 20-30 yards make him short of breath. During this time, he has gained approximately 3 pounds and has had significant swelling of his bilateral lower extremities. This morning, he had abdominal and chest pain, as a result he presented to the emergency room for further evaluation and treatment. Patient describes the chest pain has occurring on the left side of his chest, and mostly describes it as chest tightness. There is no radiation, no nausea, no vomiting, pain was approximately 10/10 at its worse. Patient's abdominal pain and chest pain now have resolved. Patient was evaluated in the emergency room, EDMD subsequently ordered a CT angiogram of his chest and abdomen which was negative for any significant abnormalities. Patient BNP was also significantly elevated, chest x-ray and CT chest was consistent with acute pulmonary edema, I was asked to admit this patient for further evaluation and treatment. Patient denies any headache, fever, nausea, vomiting, diarrhea or dysuria.   ALLERGIES:   Allergies  Allergen Reactions  . Motrin [Ibuprofen] Other (See Comments)    Affects kidneys  . Tylenol [Acetaminophen] Other (See Comments)    Affects kidneys    PAST MEDICAL HISTORY: Past Medical History  Diagnosis Date  . Iron deficiency anemia   . Chronic combined systolic and diastolic CHF, NYHA class 2     a. 11/2013 Echo: EF 40-45%,  basl-mid inflat AK, Gr 2 DD.  [name] Hyperlipidemia LDL goal <70   . Obstructive sleep apnea     a. not on home CPAP  . Ischemic cardiomyopathy     a. 11/2013 Echo: EF 40-45%, basl-mid inflat AK, Gr 2 DD, mild AI, mod dil LA/RA, mod reduced RV fxn, PASP 10mmHg.  [name] Homelessness   . Moderate to severe pulmonary hypertension 03/2010    a. PA peak pressure of 76 mmHg (per 2D Echo 03/2010)  . Polysubstance abuse     a. cocaine, THC  . AV malformation of gastrointestinal tract     a. w/ h/o GIB.  [name] Family history of early CAD   . DDD (degenerative disc disease), lumbar   . Peripheral vascular disease   . Seizure disorder   . Hypertension   . CAD (coronary artery disease)     a. s/p 3-vessel CABG (12/2007) // 100% RCA stenosis with collaterals from left system. Severe bifurcation lesions of proxima CXA and OM. Moderate LAD disease - followed by Dr. Wyline Copas in [facility];  b. 11/2013 Myoview: Large fixed inf defect w/o ischemia, EF 35%.  . Diabetes   . Chronic kidney disease (CKD), stage III (moderate)     a. BL SCr 1.5-1.6  . Renal artery stenosis   . Seizures     PAST SURGICAL HISTORY: Past Surgical History  Procedure Laterality Date  . Apc  03/2010    To treat small bowel AVMs  . Esophagogastroduodenoscopy N/A 08/14/2012    Procedure: ESOPHAGOGASTRODUODENOSCOPY (EGD);  Surgeon: [name], MD;  Location: [facility];  Service:  Endoscopy;  Laterality: N/A;  . Hot hemostasis N/A 08/14/2012    Procedure: HOT HEMOSTASIS (ARGON PLASMA COAGULATION/BICAP);  Surgeon: [name], MD;  Location: [facility] ENDOSCOPY;  Service: Endoscopy;  Laterality: N/A;  . Esophagogastroduodenoscopy (egd) with propofol N/A 08/04/2013    Procedure: ESOPHAGOGASTRODUODENOSCOPY (EGD) WITH PROPOFOL;  Surgeon: [name], MD;  Location: [facility] ENDOSCOPY;  Service: Endoscopy;  Laterality: N/A;  . Coronary artery bypass graft  12/2007    MEDICATIONS AT HOME: Prior to Admission medications   Medication Sig Start Date End Date  Taking? Authorizing Provider  amLODipine (NORVASC) 10 MG tablet Take 10 mg by mouth daily.   Yes Historical Provider, MD  aspirin EC 81 MG tablet Take 81 mg by mouth daily.   Yes Historical Provider, MD  citalopram (CELEXA) 20 MG tablet Take 20 mg by mouth daily.   Yes Historical Provider, MD  cloNIDine (CATAPRES) 0.2 MG tablet Take 0.5 tablets (0.1 mg total) by mouth 2 (two) times daily. 02/09/14  Yes Modena Jansky, MD  ferrous sulfate 325 (65 FE) MG tablet Take 325 mg by mouth 2 (two) times daily with a meal.    Yes Historical Provider, MD  furosemide (LASIX) 80 MG tablet Take 80 mg by mouth daily.   Yes Historical Provider, MD  hydrALAZINE (APRESOLINE) 50 MG tablet Take 1.5 tablets (75 mg total) by mouth 3 (three) times daily. 02/09/14  Yes Modena Jansky, MD  isosorbide mononitrate (IMDUR) 30 MG 24 hr tablet Take 30 mg by mouth daily.   Yes Historical Provider, MD  pantoprazole (PROTONIX) 40 MG tablet Take 40 mg by mouth daily.    Yes Historical Provider, MD  phenytoin (DILANTIN) 100 MG ER capsule Take 200-300 mg by mouth 2 (two) times daily. 200mg  in the morning, 300mg  in the evening   Yes Historical Provider, MD  pravastatin (PRAVACHOL) 40 MG tablet Take 1 tablet (40 mg total) by mouth daily at 6 PM. 02/09/14  Yes Modena Jansky, MD  azithromycin (ZITHROMAX) 250 MG tablet Take 1 tablet (250 mg total) by mouth daily. Take 1 tablet a day until gone Patient not taking: Reported on 04/26/2014 03/01/14   Dot Lanes, MD  docusate sodium (COLACE) 100 MG capsule Take 100 mg by mouth 2 (two) times daily.    Historical Provider, MD  gabapentin (NEURONTIN) 100 MG capsule Take 100 mg by mouth 2 (two) times daily.    Historical Provider, MD  nitroGLYCERIN (NITROSTAT) 0.4 MG SL tablet Place 1 tablet (0.4 mg total) under the tongue every 5 (five) minutes as needed for chest pain (upto max 3 doses at one time.). 02/09/14   Modena Jansky, MD    FAMILY HISTORY: Family History  Problem Relation  Age of Onset  . Heart disease Mother     unknown type  . Heart disease Father 13    died of MI at 41yo  . Heart disease Paternal Grandfather 54    died of MI  . Heart disease    . Heart disease Brother 30    SOCIAL HISTORY:  reports that he has quit smoking. His smoking use included Cigarettes. He has a 14 pack-year smoking history. He has never used smokeless tobacco. He reports that he uses illicit drugs (Cocaine and Marijuana) about once per week. He reports that he does not drink alcohol.  REVIEW OF SYSTEMS:  Constitutional:   No  weight loss, night sweats,  Fevers, chills, fatigue.  HEENT:    No headaches, Difficulty swallowing,Tooth/dental  problems,Sore throat  Cardio-vascular: No chest pain,  Orthopnea, PND, swelling in lower extremities, anasarca  GI:  No heartburn, indigestion, abdominal pain, nausea, vomiting, diarrhea  Resp: No shortness of breath with exertion or at rest.  No excess mucus, no productive cough, No non-productive cough  Skin:  no rash or lesions.  GU:  no dysuria, change in color of urine, no urgency or frequency.  No flank pain.  Musculoskeletal: No joint pain or swelling.  No decreased range of motion.  No back pain.  Psych: No change in mood or affect. No depression or anxiety.  No memory loss.   PHYSICAL EXAM: Blood pressure 181/102, pulse 87, temperature 97.9 F (36.6 C), temperature source Oral, resp. rate 26, weight 80.74 kg (178 lb), SpO2 97 %.  General appearance :Awake, alert, not in any distress. Speech Clear. Not toxic Looking HEENT: Atraumatic and Normocephalic, pupils equally reactive to light and accomodation Neck: supple, no JVD. No cervical lymphadenopathy.  Chest:Good air entry bilaterally, few bibasilar rales, no rhonchi. CVS: S1 S2 regular, no murmurs.  Abdomen: Bowel sounds present, Non tender and not distended with no gaurding, rigidity or rebound. Extremities: B/L Lower Ext shows 2+ edema, both legs are warm to  touch. Left leg with ankle bracelet Neurology: Non focal Skin:No Rash Wounds:N/A  LABS ON ADMISSION:   Recent Labs  04/26/14 0904  NA 136  K 4.1  CL 105  CO2 25  GLUCOSE 98  BUN 18  CREATININE 1.13  CALCIUM 8.3*    Recent Labs  04/26/14 0904  AST 42*  ALT 30  ALKPHOS 87  BILITOT 1.2  PROT 7.2  ALBUMIN 3.0*    Recent Labs  04/26/14 0904  LIPASE 41    Recent Labs  04/26/14 0904  WBC 5.4  HGB 8.7*  HCT 28.6*  MCV 82.7  PLT 310   No results for input(s): CKTOTAL, CKMB, CKMBINDEX, TROPONINI in the last 72 hours. No results for input(s): DDIMER in the last 72 hours. Invalid input(s): POCBNP   RADIOLOGIC STUDIES ON ADMISSION: Dg Chest 2 View  04/26/2014   CLINICAL DATA:  Left-sided chest pain and shortness of breath since this morning. Bilateral leg swelling 3 days.  EXAM: CHEST  2 VIEW  COMPARISON:  03/01/2014 and 01/01/2014  FINDINGS: Sternotomy wires unchanged. Lungs are adequately inflated without focal consolidation. Mild prominence of the perihilar markings unchanged likely mild degree of vascular congestion. Possible small amount left pleural fluid. Moderate stable cardiomegaly. Remainder of the exam is unchanged.  IMPRESSION: Stable cardiomegaly with suggestion of minimal vascular congestion. Possible small amount left pleural fluid.   Electronically Signed   By: Marin Olp M.D.   On: 04/26/2014 09:45   Ct Angio Chest Aorta W/cm &/or Wo/cm  04/26/2014   CLINICAL DATA:  Left-sided chest and. Rule out dissection. Initial encounter. Left leg swelling. Abdominal bloating. Diabetes. Coronary artery disease.  EXAM: CT ANGIOGRAPHY CHEST, ABDOMEN AND PELVIS  TECHNIQUE: Multidetector CT imaging through the chest, abdomen and pelvis was performed using the standard protocol during bolus administration of intravenous contrast. Multiplanar reconstructed images and MIPs were obtained and reviewed to evaluate the vascular anatomy.  CONTRAST:  183mL OMNIPAQUE IOHEXOL 350  MG/ML SOLN  COMPARISON:  Chest radiograph of 04/26/2014. Chest CT 10/23/2013. Abdominal pelvic CT of 07/16/2013.  FINDINGS: CT CHEST FINDINGS  Mediastinum/Nodes: Mild bilateral gynecomastia.  Normal caliber the great vessels, with mild atherosclerosis within. Prior median sternotomy for CABG. No evidence of thoracic aortic dissection. Ascending aorta measures 4.4 cm  at the sinuses of Valsalva, 3.2 cm at the sino-tubular junction, and 3.8 cm at the mid ascending segment. Felt to be similar.  Moderate cardiomegaly, without pericardial effusion. Pulmonary artery is enlarged, with the outflow tract measuring 4.1 cm. No pulmonary embolism identified.  Mild thoracic adenopathy. Example 1.6 cm precarinal node, unchanged. This is unchanged since the prior exam. No hilar adenopathy.  Lungs/Pleura: Mild motion degradation. Poor inspiratory effort. Mild mosaic attenuation which could relate to air trapping or mild pulmonary venous congestion.  Small left greater than right pleural effusions, increased since 10/23/2013.  Musculoskeletal: No acute osseous abnormality.  CT ABDOMEN PELVIS FINDINGS  Hepatobiliary: Mildly irregular hepatic capsule, suggesting cirrhosis. Normal gallbladder and biliary tract.  Motion degradation continues into the abdomen.  Pancreas: No pancreatic duct dilatation or focal mass. There is interstitial thickening in the peripancreatic space, including on image 143.  Spleen: Normal  Adrenals/Urinary Tract: Normal adrenal glands. Interpolar left renal lesion measures 1.4 cm and greater than fluid density on image 138. This was present back to 03/24/2010, and similar in size on 07/16/2013. Normal right kidney, without hydronephrosis.  Bladder wall is thickened.  High density material within.  Stomach/Bowel: Proximal gastric underdistention. Normal colon, appendix, and terminal ileum. Normal small bowel.  Vascular/Lymphatic: Abdominal aorta is normal in caliber, with atherosclerosis within. Atherosclerotic  irregularity at the origin of both renal arteries, without hemodynamically significant stenosis. The inferior mesenteric artery is patent.  The left internal iliac is significantly narrowed at its origin. No abdominopelvic adenopathy.  Reproductive: Normal prostate.  Other: Small fat containing left inguinal hernia. Small volume abdominal pelvic ascites.  Diffuse anasarca  Musculoskeletal: Degenerative disc disease at the L4-5 level. Multiple lumbosacral disc bulges. S-shaped thoracolumbar spine curvature.  Review of the MIP images confirms the above findings.  IMPRESSION: CT CHEST IMPRESSION  1. Mild motion degradation. 2. No evidence of thoracic aortic dissection. Mild ascending aortic prominence, as detailed above. Recommend annual imaging followup by CTA or MRA. This recommendation follows 2010 ACCF/AHA/AATS/ACR/ASA/SCA/SCAI/SIR/STS/SVM Guidelines for the Diagnosis and Management of Patients With Thoracic Aortic Disease. Circulation. 2010; 121: Z601-U932 3. Probable congestive heart failure, with bilateral pleural effusions. 4. Pulmonary artery enlargement suggests pulmonary arterial hypertension. 5. Similar mediastinal adenopathy. This could be related to congestive heart failure. Recommend attention on follow-up.  CT ABDOMEN AND PELVIS IMPRESSION  1. Mild motion degradation. 2. Advanced aortic atherosclerosis, without dissection or aneurysm. 3. Abdominal pelvic ascites.  Suspect mild cirrhosis. 4. Peripancreatic interstitial thickening. Cannot exclude pancreatitis. This appearance is nonspecific in the setting of ascites. 5. Pericystic edema. This could represent cystitis. Increased density within the urinary bladder could represent Early contrast excretion or hemorrhage. Consider correlation with urinalysis.   Electronically Signed   By: Abigail Miyamoto M.D.   On: 04/26/2014 11:44   Ct Cta Abd/pel W/cm &/or W/o Cm  04/26/2014   CLINICAL DATA:  Left-sided chest and. Rule out dissection. Initial encounter. Left  leg swelling. Abdominal bloating. Diabetes. Coronary artery disease.  EXAM: CT ANGIOGRAPHY CHEST, ABDOMEN AND PELVIS  TECHNIQUE: Multidetector CT imaging through the chest, abdomen and pelvis was performed using the standard protocol during bolus administration of intravenous contrast. Multiplanar reconstructed images and MIPs were obtained and reviewed to evaluate the vascular anatomy.  CONTRAST:  178mL OMNIPAQUE IOHEXOL 350 MG/ML SOLN  COMPARISON:  Chest radiograph of 04/26/2014. Chest CT 10/23/2013. Abdominal pelvic CT of 07/16/2013.  FINDINGS: CT CHEST FINDINGS  Mediastinum/Nodes: Mild bilateral gynecomastia.  Normal caliber the great vessels, with mild atherosclerosis within. Prior median  sternotomy for CABG. No evidence of thoracic aortic dissection. Ascending aorta measures 4.4 cm at the sinuses of Valsalva, 3.2 cm at the sino-tubular junction, and 3.8 cm at the mid ascending segment. Felt to be similar.  Moderate cardiomegaly, without pericardial effusion. Pulmonary artery is enlarged, with the outflow tract measuring 4.1 cm. No pulmonary embolism identified.  Mild thoracic adenopathy. Example 1.6 cm precarinal node, unchanged. This is unchanged since the prior exam. No hilar adenopathy.  Lungs/Pleura: Mild motion degradation. Poor inspiratory effort. Mild mosaic attenuation which could relate to air trapping or mild pulmonary venous congestion.  Small left greater than right pleural effusions, increased since 10/23/2013.  Musculoskeletal: No acute osseous abnormality.  CT ABDOMEN PELVIS FINDINGS  Hepatobiliary: Mildly irregular hepatic capsule, suggesting cirrhosis. Normal gallbladder and biliary tract.  Motion degradation continues into the abdomen.  Pancreas: No pancreatic duct dilatation or focal mass. There is interstitial thickening in the peripancreatic space, including on image 143.  Spleen: Normal  Adrenals/Urinary Tract: Normal adrenal glands. Interpolar left renal lesion measures 1.4 cm and  greater than fluid density on image 138. This was present back to 03/24/2010, and similar in size on 07/16/2013. Normal right kidney, without hydronephrosis.  Bladder wall is thickened.  High density material within.  Stomach/Bowel: Proximal gastric underdistention. Normal colon, appendix, and terminal ileum. Normal small bowel.  Vascular/Lymphatic: Abdominal aorta is normal in caliber, with atherosclerosis within. Atherosclerotic irregularity at the origin of both renal arteries, without hemodynamically significant stenosis. The inferior mesenteric artery is patent.  The left internal iliac is significantly narrowed at its origin. No abdominopelvic adenopathy.  Reproductive: Normal prostate.  Other: Small fat containing left inguinal hernia. Small volume abdominal pelvic ascites.  Diffuse anasarca  Musculoskeletal: Degenerative disc disease at the L4-5 level. Multiple lumbosacral disc bulges. S-shaped thoracolumbar spine curvature.  Review of the MIP images confirms the above findings.  IMPRESSION: CT CHEST IMPRESSION  1. Mild motion degradation. 2. No evidence of thoracic aortic dissection. Mild ascending aortic prominence, as detailed above. Recommend annual imaging followup by CTA or MRA. This recommendation follows 2010 ACCF/AHA/AATS/ACR/ASA/SCA/SCAI/SIR/STS/SVM Guidelines for the Diagnosis and Management of Patients With Thoracic Aortic Disease. Circulation. 2010; 121: E563-J497 3. Probable congestive heart failure, with bilateral pleural effusions. 4. Pulmonary artery enlargement suggests pulmonary arterial hypertension. 5. Similar mediastinal adenopathy. This could be related to congestive heart failure. Recommend attention on follow-up.  CT ABDOMEN AND PELVIS IMPRESSION  1. Mild motion degradation. 2. Advanced aortic atherosclerosis, without dissection or aneurysm. 3. Abdominal pelvic ascites.  Suspect mild cirrhosis. 4. Peripancreatic interstitial thickening. Cannot exclude pancreatitis. This appearance is  nonspecific in the setting of ascites. 5. Pericystic edema. This could represent cystitis. Increased density within the urinary bladder could represent Early contrast excretion or hemorrhage. Consider correlation with urinalysis.   Electronically Signed   By: Abigail Miyamoto M.D.   On: 04/26/2014 11:44     EKG: Independently reviewed. Normal sinus rhythm  ASSESSMENT AND PLAN: Present on Admission:  . Acute diastolic CHF (congestive heart failure): Will admit to telemetry unit, started on IV Lasix. Daily weights, strict I&O's. Suspect noncompliant to medications, diet and ongoing cocaine use likely precipitants for acute diastolic heart failure.   . Polysubstance abuse: Urine drug screen positive for cocaine, claims last used cocaine this past Wednesday. Claims that he doesn't once or twice weekly. Denies IVDA, claims he smokes cocaine. I've counseled him extensively   . Chest pain: Suspect this is atypical, however has ongoing cocaine use. Monitor in telemetry, cycle cardiac enzymes.  Supportive care. Currently chest pain-free.  . Moderate to severe pulmonary hypertension  . CAD- CABG x3 '09. Myoview no ischemia 11/27/13: Continue with aspirin. Avoid beta blockers given ongoing cocaine use.  Further plan will depend as patient's clinical course evolves and further radiologic and laboratory data become available. Patient will be monitored closely.   Above noted plan was discussed with patient, he was in agreement.   DVT Prophylaxis: Prophylactic Lovenox   Code Status: Full Code  Disposition Plan: Home when able.    Total time spent for admission equals 45 minutes.  [facility] Pager [phone]  If 7PM-7AM, please contact night-coverage [email or web address] Password [facility] 04/26/2014, 4:50 PM

## 2014-04-26 NOTE — ED Notes (Signed)
Patient transported to CT 

## 2014-04-27 DIAGNOSIS — I5043 Acute on chronic combined systolic (congestive) and diastolic (congestive) heart failure: Principal | ICD-10-CM

## 2014-04-27 LAB — BASIC METABOLIC PANEL
ANION GAP: 8 (ref 5–15)
BUN: 16 mg/dL (ref 6–23)
CHLORIDE: 105 mmol/L (ref 96–112)
CO2: 23 mmol/L (ref 19–32)
Calcium: 8.5 mg/dL (ref 8.4–10.5)
Creatinine, Ser: 1.19 mg/dL (ref 0.50–1.35)
GFR calc Af Amer: 70 mL/min — ABNORMAL LOW (ref >90)
GFR calc non Af Amer: 60 mL/min — ABNORMAL LOW (ref >90)
GLUCOSE: 97 mg/dL (ref 70–99)
Potassium: 4.1 mmol/L (ref 3.5–5.1)
Sodium: 136 mmol/L (ref 135–145)

## 2014-04-27 LAB — TROPONIN I
Troponin I: 0.04 ng/mL — ABNORMAL HIGH (ref <0.031)
Troponin I: 0.03 ng/mL (ref <0.031)

## 2014-04-27 LAB — MRSA PCR SCREENING: MRSA by PCR: POSITIVE — AB

## 2014-04-27 NOTE — Progress Notes (Signed)
[facility] PROGRESS NOTE  Charles [name] OMB:[id number] DOB: October 18, 1945 DOA: 04/26/2014 PCP: [name]  Assessment/Plan: 1. Acutely decompensated diastolic CHF 1. Dry wt of 168lbs noted to be 2. Continue IV lasix as tolerated 3. Follow renal function 2. Cocaine abuse 1. Cessation done at bedside 2. Pt claims last using one week ago 3. States he will quit 3. Chest pain 1. Peak trop noted to be 0.25 2. Stress test in 9/15 was unremarkable 3. Have consulted Cardiology 4. Recs to cont CHF mgt 5. Elevated trop likely secondary to acute CHF 4. Mod-severe pulm htn 1. Diuresis per above 2. Stable 5. CAD s/p CABGx3 1. Presently pain free 6. DVT prophylaxis  Code Status: Full Family Communication: Pt in room (indicate person spoken with, relationship, and if by phone, the number) Disposition Plan: Pending   Consultants:  Cardiology  Procedures:    Antibiotics:  none (indicate start date, and stop date if known)  HPI/Subjective: Reports feeling better today. CP overnight, but none when seen this AM  Objective: Filed Vitals:   04/27/14 0007 04/27/14 0543 04/27/14 1100 04/27/14 1438  BP: 120/69 150/77 160/72 101/58  Pulse: 65 64 6 59  Temp: 98 F (36.7 C) 97.9 F (36.6 C)  97.4 F (36.3 C)  TempSrc: Oral Oral  Oral  Resp: 22 22 20 18   Height:      Weight:  80.74 kg (178 lb)    SpO2: 98% 96% 98% 99%    Intake/Output Summary (Last 24 hours) at 04/27/14 1740 Last data filed at 04/27/14 1437  Gross per 24 hour  Intake   1482 ml  Output   2325 ml  Net   -843 ml   Filed Weights   04/26/14 0850 04/26/14 1740 04/27/14 0543  Weight: 80.74 kg (178 lb) 81.784 kg (180 lb 4.8 oz) 80.74 kg (178 lb)    Exam:   General:  Awake, in nad, sitting in chair  Cardiovascular: regular, s1, s2  Respiratory: normal resp effort, no wheezing  Abdomen: soft,nondistended  Musculoskeletal: perfused,no clubbing   Data Reviewed: Basic Metabolic  Panel:  Recent Labs Lab 04/26/14 0904 04/27/14 0635  NA 136 136  K 4.1 4.1  CL 105 105  CO2 25 23  GLUCOSE 98 97  BUN 18 16  CREATININE 1.13 1.19  CALCIUM 8.3* 8.5   Liver Function Tests:  Recent Labs Lab 04/26/14 0904  AST 42*  ALT 30  ALKPHOS 87  BILITOT 1.2  PROT 7.2  ALBUMIN 3.0*    Recent Labs Lab 04/26/14 0904  LIPASE 41   No results for input(s): AMMONIA in the last 168 hours. CBC:  Recent Labs Lab 04/26/14 0904  WBC 5.4  HGB 8.7*  HCT 28.6*  MCV 82.7  PLT 310   Cardiac Enzymes:  Recent Labs Lab 04/26/14 1842 04/26/14 2353 04/27/14 0635  TROPONINI 0.25* 0.04* 0.03   BNP (last 3 results)  Recent Labs  04/26/14 0904  BNP 2313.3*    ProBNP (last 3 results)  Recent Labs  01/01/14 1107 02/07/14 0123 03/01/14 2250  PROBNP 3225.0* 2648.0* 7421.0*    CBG: No results for input(s): GLUCAP in the last 168 hours.  Recent Results (from the past 240 hour(s))  MRSA PCR Screening     Status: Abnormal   Collection Time: 04/27/14 11:51 AM  Result Value Ref Range Status   MRSA by PCR POSITIVE (A) NEGATIVE Final    Comment:        The GeneXpert MRSA Assay (FDA approved  for NASAL specimens only), is one component of a comprehensive MRSA colonization surveillance program. It is not intended to diagnose MRSA infection nor to guide or monitor treatment for MRSA infections. RESULT CALLED TO, READ BACK BY AND VERIFIED WITH: A. MAGRITAND RN 14:50 04/27/14 ([email or web address])      Studies: Dg Chest 2 View  04/26/2014   CLINICAL DATA:  Left-sided chest pain and shortness of breath since this morning. Bilateral leg swelling 3 days.  EXAM: CHEST  2 VIEW  COMPARISON:  03/01/2014 and 01/01/2014  FINDINGS: Sternotomy wires unchanged. Lungs are adequately inflated without focal consolidation. Mild prominence of the perihilar markings unchanged likely mild degree of vascular congestion. Possible small amount left pleural fluid. Moderate stable cardiomegaly.  Remainder of the exam is unchanged.  IMPRESSION: Stable cardiomegaly with suggestion of minimal vascular congestion. Possible small amount left pleural fluid.   Electronically Signed   By: Marin Olp M.D.   On: 04/26/2014 09:45   Ct Angio Chest Aorta W/cm &/or Wo/cm  04/26/2014   CLINICAL DATA:  Left-sided chest and. Rule out dissection. Initial encounter. Left leg swelling. Abdominal bloating. Diabetes. Coronary artery disease.  EXAM: CT ANGIOGRAPHY CHEST, ABDOMEN AND PELVIS  TECHNIQUE: Multidetector CT imaging through the chest, abdomen and pelvis was performed using the standard protocol during bolus administration of intravenous contrast. Multiplanar reconstructed images and MIPs were obtained and reviewed to evaluate the vascular anatomy.  CONTRAST:  126mL OMNIPAQUE IOHEXOL 350 MG/ML SOLN  COMPARISON:  Chest radiograph of 04/26/2014. Chest CT 10/23/2013. Abdominal pelvic CT of 07/16/2013.  FINDINGS: CT CHEST FINDINGS  Mediastinum/Nodes: Mild bilateral gynecomastia.  Normal caliber the great vessels, with mild atherosclerosis within. Prior median sternotomy for CABG. No evidence of thoracic aortic dissection. Ascending aorta measures 4.4 cm at the sinuses of Valsalva, 3.2 cm at the sino-tubular junction, and 3.8 cm at the mid ascending segment. Felt to be similar.  Moderate cardiomegaly, without pericardial effusion. Pulmonary artery is enlarged, with the outflow tract measuring 4.1 cm. No pulmonary embolism identified.  Mild thoracic adenopathy. Example 1.6 cm precarinal node, unchanged. This is unchanged since the prior exam. No hilar adenopathy.  Lungs/Pleura: Mild motion degradation. Poor inspiratory effort. Mild mosaic attenuation which could relate to air trapping or mild pulmonary venous congestion.  Small left greater than right pleural effusions, increased since 10/23/2013.  Musculoskeletal: No acute osseous abnormality.  CT ABDOMEN PELVIS FINDINGS  Hepatobiliary: Mildly irregular hepatic capsule,  suggesting cirrhosis. Normal gallbladder and biliary tract.  Motion degradation continues into the abdomen.  Pancreas: No pancreatic duct dilatation or focal mass. There is interstitial thickening in the peripancreatic space, including on image 143.  Spleen: Normal  Adrenals/Urinary Tract: Normal adrenal glands. Interpolar left renal lesion measures 1.4 cm and greater than fluid density on image 138. This was present back to 03/24/2010, and similar in size on 07/16/2013. Normal right kidney, without hydronephrosis.  Bladder wall is thickened.  High density material within.  Stomach/Bowel: Proximal gastric underdistention. Normal colon, appendix, and terminal ileum. Normal small bowel.  Vascular/Lymphatic: Abdominal aorta is normal in caliber, with atherosclerosis within. Atherosclerotic irregularity at the origin of both renal arteries, without hemodynamically significant stenosis. The inferior mesenteric artery is patent.  The left internal iliac is significantly narrowed at its origin. No abdominopelvic adenopathy.  Reproductive: Normal prostate.  Other: Small fat containing left inguinal hernia. Small volume abdominal pelvic ascites.  Diffuse anasarca  Musculoskeletal: Degenerative disc disease at the L4-5 level. Multiple lumbosacral disc bulges. S-shaped thoracolumbar spine curvature.  Review of  the MIP images confirms the above findings.  IMPRESSION: CT CHEST IMPRESSION  1. Mild motion degradation. 2. No evidence of thoracic aortic dissection. Mild ascending aortic prominence, as detailed above. Recommend annual imaging followup by CTA or MRA. This recommendation follows 2010 ACCF/AHA/AATS/ACR/ASA/SCA/SCAI/SIR/STS/SVM Guidelines for the Diagnosis and Management of Patients With Thoracic Aortic Disease. Circulation. 2010; 121: I948-N462 3. Probable congestive heart failure, with bilateral pleural effusions. 4. Pulmonary artery enlargement suggests pulmonary arterial hypertension. 5. Similar mediastinal  adenopathy. This could be related to congestive heart failure. Recommend attention on follow-up.  CT ABDOMEN AND PELVIS IMPRESSION  1. Mild motion degradation. 2. Advanced aortic atherosclerosis, without dissection or aneurysm. 3. Abdominal pelvic ascites.  Suspect mild cirrhosis. 4. Peripancreatic interstitial thickening. Cannot exclude pancreatitis. This appearance is nonspecific in the setting of ascites. 5. Pericystic edema. This could represent cystitis. Increased density within the urinary bladder could represent Early contrast excretion or hemorrhage. Consider correlation with urinalysis.   Electronically Signed   By: Abigail Miyamoto M.D.   On: 04/26/2014 11:44   Ct Cta Abd/pel W/cm &/or W/o Cm  04/26/2014   CLINICAL DATA:  Left-sided chest and. Rule out dissection. Initial encounter. Left leg swelling. Abdominal bloating. Diabetes. Coronary artery disease.  EXAM: CT ANGIOGRAPHY CHEST, ABDOMEN AND PELVIS  TECHNIQUE: Multidetector CT imaging through the chest, abdomen and pelvis was performed using the standard protocol during bolus administration of intravenous contrast. Multiplanar reconstructed images and MIPs were obtained and reviewed to evaluate the vascular anatomy.  CONTRAST:  166mL OMNIPAQUE IOHEXOL 350 MG/ML SOLN  COMPARISON:  Chest radiograph of 04/26/2014. Chest CT 10/23/2013. Abdominal pelvic CT of 07/16/2013.  FINDINGS: CT CHEST FINDINGS  Mediastinum/Nodes: Mild bilateral gynecomastia.  Normal caliber the great vessels, with mild atherosclerosis within. Prior median sternotomy for CABG. No evidence of thoracic aortic dissection. Ascending aorta measures 4.4 cm at the sinuses of Valsalva, 3.2 cm at the sino-tubular junction, and 3.8 cm at the mid ascending segment. Felt to be similar.  Moderate cardiomegaly, without pericardial effusion. Pulmonary artery is enlarged, with the outflow tract measuring 4.1 cm. No pulmonary embolism identified.  Mild thoracic adenopathy. Example 1.6 cm precarinal node,  unchanged. This is unchanged since the prior exam. No hilar adenopathy.  Lungs/Pleura: Mild motion degradation. Poor inspiratory effort. Mild mosaic attenuation which could relate to air trapping or mild pulmonary venous congestion.  Small left greater than right pleural effusions, increased since 10/23/2013.  Musculoskeletal: No acute osseous abnormality.  CT ABDOMEN PELVIS FINDINGS  Hepatobiliary: Mildly irregular hepatic capsule, suggesting cirrhosis. Normal gallbladder and biliary tract.  Motion degradation continues into the abdomen.  Pancreas: No pancreatic duct dilatation or focal mass. There is interstitial thickening in the peripancreatic space, including on image 143.  Spleen: Normal  Adrenals/Urinary Tract: Normal adrenal glands. Interpolar left renal lesion measures 1.4 cm and greater than fluid density on image 138. This was present back to 03/24/2010, and similar in size on 07/16/2013. Normal right kidney, without hydronephrosis.  Bladder wall is thickened.  High density material within.  Stomach/Bowel: Proximal gastric underdistention. Normal colon, appendix, and terminal ileum. Normal small bowel.  Vascular/Lymphatic: Abdominal aorta is normal in caliber, with atherosclerosis within. Atherosclerotic irregularity at the origin of both renal arteries, without hemodynamically significant stenosis. The inferior mesenteric artery is patent.  The left internal iliac is significantly narrowed at its origin. No abdominopelvic adenopathy.  Reproductive: Normal prostate.  Other: Small fat containing left inguinal hernia. Small volume abdominal pelvic ascites.  Diffuse anasarca  Musculoskeletal: Degenerative disc disease at  the L4-5 level. Multiple lumbosacral disc bulges. S-shaped thoracolumbar spine curvature.  Review of the MIP images confirms the above findings.  IMPRESSION: CT CHEST IMPRESSION  1. Mild motion degradation. 2. No evidence of thoracic aortic dissection. Mild ascending aortic prominence, as  detailed above. Recommend annual imaging followup by CTA or MRA. This recommendation follows 2010 ACCF/AHA/AATS/ACR/ASA/SCA/SCAI/SIR/STS/SVM Guidelines for the Diagnosis and Management of Patients With Thoracic Aortic Disease. Circulation. 2010; 121: S854-O270 3. Probable congestive heart failure, with bilateral pleural effusions. 4. Pulmonary artery enlargement suggests pulmonary arterial hypertension. 5. Similar mediastinal adenopathy. This could be related to congestive heart failure. Recommend attention on follow-up.  CT ABDOMEN AND PELVIS IMPRESSION  1. Mild motion degradation. 2. Advanced aortic atherosclerosis, without dissection or aneurysm. 3. Abdominal pelvic ascites.  Suspect mild cirrhosis. 4. Peripancreatic interstitial thickening. Cannot exclude pancreatitis. This appearance is nonspecific in the setting of ascites. 5. Pericystic edema. This could represent cystitis. Increased density within the urinary bladder could represent Early contrast excretion or hemorrhage. Consider correlation with urinalysis.   Electronically Signed   By: Abigail Miyamoto M.D.   On: 04/26/2014 11:44    Scheduled Meds: . amLODipine  10 mg Oral Daily  . aspirin EC  81 mg Oral Daily  . citalopram  20 mg Oral Daily  . cloNIDine  0.1 mg Oral BID  . ferrous sulfate  325 mg Oral BID WC  . furosemide  40 mg Intravenous BID  . heparin  5,000 Units Subcutaneous 3 times per day  . hydrALAZINE  75 mg Oral TID  . isosorbide mononitrate  30 mg Oral Daily  . pantoprazole  40 mg Oral Daily  . phenytoin  200 mg Oral Daily  . phenytoin  300 mg Oral QHS  . potassium chloride  20 mEq Oral BID  . pravastatin  40 mg Oral q1800  . sodium chloride  3 mL Intravenous Q12H   Continuous Infusions:   Principal Problem:   Acute diastolic CHF (congestive heart failure) Active Problems:   CAD- CABG x3 '09. Myoview no ischemia 11/27/13   Moderate to severe pulmonary hypertension   Polysubstance abuse   Seizure disorder  Vianca Bracher,  Santoria Chason K  [facility] Pager [phone]. If 7PM-7AM, please contact night-coverage at [email or web address], password [facility] 04/27/2014, 5:40 PM  LOS: 1 day

## 2014-04-27 NOTE — Care Management Note (Addendum)
    Page 1 of 1   04/30/2014     2:49:43 PM CARE MANAGEMENT NOTE 04/30/2014  Patient:  Charles [name], Charles [name]   Account Number:  [id number]  Date Initiated:  04/27/2014  Documentation initiated by:  Apolo Cutshaw  Subjective/Objective Assessment:   Pt adm on 04/26/14 with CHF exacerbation.  PTA, pt independent of ADLS.  Pt is wearing an ankle bracelet, and has prior criminal hx.  Pt positive for cocaine upon admission.     Action/Plan:   Referral to CSW for PSA counseling.  Will cont to follow for dc needs as pt progresses.   Anticipated DC Date:  04/30/2014   Anticipated DC Plan:  HOME/SELF CARE  In-house referral  Clinical Social Worker      DC Planning Services  CM consult      Choice offered to / List presented to:             Status of service:  Completed, signed off Medicare Important Message given?  YES (If response is "NO", the following Medicare IM given date fields will be blank) Date Medicare IM given:  04/29/2014 Medicare IM given by:  Yaniv Lage Date Additional Medicare IM given:   Additional Medicare IM given by:    Discharge Disposition:  HOME/SELF CARE  Per UR Regulation:  Reviewed for med. necessity/level of care/duration of stay  If discussed at [place] of Stay Meetings, dates discussed:    Comments:  04/30/14 Ellan Lambert, RN, BSN [phone] CSW consulted for transportation needs.

## 2014-04-27 NOTE — Consult Note (Signed)
Reason for Consult:  Chest pain, positive troponin  Referring Physician:   Koden Hunzeker is an 69 y.o. male.  HPI:   Patient is a 69 year old male with history of coronary artery disease status post coronary artery bypass grafting in 2009, chronic combined systolic and diastolic heart failure, stage III kidney disease, GI bleeding, hyperlipidemia, obstructive sleep apnea not using CPAP, ischemic cardiomyopathy EF of 40-45%(last echo 11/27/2013), moderate severe pulmonary hypertension, polysubstance abuse, peripheral vascular disease, seizure disorder, diabetes.  His last nuclear stress test was September 2015 there was no reversible ischemia with a fixed large inferior wall defect. EF estimated at 35%.    Patient reports she developed 10 out of 10 sharp chest pain just below his left rib cage yesterday.  There is no radiation to arm neck back or jaw. He has had some nausea, shortness of breath, orthopnea, lower extremity edema, generalized weakness, diffuse abdominal pain.  His urine test is positive for cocaine. Initial troponin was elevated 0.25 and has trended down to normal.  Net fluids are -0.9 L.      Filed Weights   04/26/14 0850 04/26/14 1740 04/27/14 0543  Weight: 178 lb (80.74 kg) 180 lb 4.8 oz (81.784 kg) 178 lb (80.74 kg)     Past Medical History  Diagnosis Date  . Iron deficiency anemia   . Chronic combined systolic and diastolic CHF, NYHA class 2     a. 11/2013 Echo: EF 40-45%, basl-mid inflat AK, Gr 2 DD.  [name] Hyperlipidemia LDL goal <70   . Obstructive sleep apnea     a. not on home CPAP  . Ischemic cardiomyopathy     a. 11/2013 Echo: EF 40-45%, basl-mid inflat AK, Gr 2 DD, mild AI, mod dil LA/RA, mod reduced RV fxn, PASP 33mHg.  .Marland KitchenHomelessness   . Moderate to severe pulmonary hypertension 03/2010    a. PA peak pressure of 76 mmHg (per 2D Echo 03/2010)  . Polysubstance abuse     a. cocaine, THC  . AV malformation of gastrointestinal tract     a. w/ h/o GIB.    .Marland KitchenFamily history of early CAD   . DDD (degenerative disc disease), lumbar   . Peripheral vascular disease   . Seizure disorder   . Hypertension   . CAD (coronary artery disease)     a. s/p 3-vessel CABG (12/2007) // 100% RCA stenosis with collaterals from left system. Severe bifurcation lesions of proxima CXA and OM. Moderate LAD disease - [facility]  b. 11/2013 Myoview: Large fixed inf defect w/o ischemia, EF 35%.  . Diabetes   . Chronic kidney disease (CKD), stage III (moderate)     a. BL SCr 1.5-1.6  . Renal artery stenosis   . Seizures     Past Surgical History  Procedure Laterality Date  . Apc  03/2010    To treat small bowel AVMs  . Esophagogastroduodenoscopy N/A 08/14/2012    Procedure: ESOPHAGOGASTRODUODENOSCOPY (EGD);  Surgeon: JJuanita Craver MD;  [facility];  Service: Endoscopy;  Laterality: N/A;  . Hot hemostasis N/A 08/14/2012    Procedure: HOT HEMOSTASIS (ARGON PLASMA COAGULATION/BICAP);  Surgeon: JJuanita Craver MD;  [facility];  Service: Endoscopy;  Laterality: N/A;  . Esophagogastroduodenoscopy (egd) with propofol N/A 08/04/2013    Procedure: ESOPHAGOGASTRODUODENOSCOPY (EGD) WITH PROPOFOL;  Surgeon: MLadene Artist MD;  [facility];  Service: Endoscopy;  Laterality: N/A;  . Coronary artery bypass graft  12/2007  Family History  Problem Relation Age of Onset  . Heart disease Mother     unknown type  . Heart disease Father 76    died of MI at 15yo  . Heart disease Paternal Grandfather 87    died of MI  . Heart disease    . Heart disease Brother 15    Social History:  reports that he has quit smoking. His smoking use included Cigarettes. He has a 14 pack-year smoking history. He has never used smokeless tobacco. He reports that he uses illicit drugs (Cocaine and Marijuana) about once per week. He reports that he does not drink alcohol.  Allergies:  Allergies  Allergen Reactions  . Motrin [Ibuprofen] Other  (See Comments)    Affects kidneys  . Tylenol [Acetaminophen] Other (See Comments)    Affects kidneys    Medications:  Scheduled Meds: . amLODipine  10 mg Oral Daily  . aspirin EC  81 mg Oral Daily  . citalopram  20 mg Oral Daily  . cloNIDine  0.1 mg Oral BID  . ferrous sulfate  325 mg Oral BID WC  . furosemide  40 mg Intravenous BID  . heparin  5,000 Units Subcutaneous 3 times per day  . hydrALAZINE  75 mg Oral TID  . isosorbide mononitrate  30 mg Oral Daily  . pantoprazole  40 mg Oral Daily  . phenytoin  200 mg Oral Daily  . phenytoin  300 mg Oral QHS  . potassium chloride  20 mEq Oral BID  . pravastatin  40 mg Oral q1800  . sodium chloride  3 mL Intravenous Q12H   Continuous Infusions:  PRN Meds:.sodium chloride, acetaminophen, albuterol, morphine injection, nitroGLYCERIN, ondansetron (ZOFRAN) IV, oxyCODONE, sodium chloride   Results for orders placed or performed during the hospital encounter of 04/26/14 (from the past 48 hour(s))  CBC     Status: Abnormal   Collection Time: 04/26/14  9:04 AM  Result Value Ref Range   WBC 5.4 4.0 - 10.5 K/uL   RBC 3.46 (L) 4.22 - 5.81 MIL/uL   Hemoglobin 8.7 (L) 13.0 - 17.0 g/dL   HCT 28.6 (L) 39.0 - 52.0 %   MCV 82.7 78.0 - 100.0 fL   MCH 25.1 (L) 26.0 - 34.0 pg   MCHC 30.4 30.0 - 36.0 g/dL   RDW 18.4 (H) 11.5 - 15.5 %   Platelets 310 150 - 400 K/uL  Basic metabolic panel     Status: Abnormal   Collection Time: 04/26/14  9:04 AM  Result Value Ref Range   Sodium 136 135 - 145 mmol/L   Potassium 4.1 3.5 - 5.1 mmol/L   Chloride 105 96 - 112 mmol/L   CO2 25 19 - 32 mmol/L   Glucose, Bld 98 70 - 99 mg/dL   BUN 18 6 - 23 mg/dL   Creatinine, Ser 1.13 0.50 - 1.35 mg/dL   Calcium 8.3 (L) 8.4 - 10.5 mg/dL   GFR calc non Af Amer 64 (L) >90 mL/min   GFR calc Af Amer 74 (L) >90 mL/min    Comment: (NOTE) The eGFR has been calculated using the CKD EPI equation. This calculation has not been validated in all clinical situations. eGFR's  persistently <90 mL/min signify possible Chronic Kidney Disease.    Anion gap 6 5 - 15  BNP (order ONLY if patient complains of dyspnea/SOB AND you have documented it for THIS visit)     Status: Abnormal   Collection Time: 04/26/14  9:04 AM  Result Value Ref Range   B Natriuretic Peptide 2313.3 (H) 0.0 - 100.0 pg/mL  Hepatic function panel     Status: Abnormal   Collection Time: 04/26/14  9:04 AM  Result Value Ref Range   Total Protein 7.2 6.0 - 8.3 g/dL   Albumin 3.0 (L) 3.5 - 5.2 g/dL   AST 42 (H) 0 - 37 U/L   ALT 30 0 - 53 U/L   Alkaline Phosphatase 87 39 - 117 U/L   Total Bilirubin 1.2 0.3 - 1.2 mg/dL   Bilirubin, Direct 0.5 0.0 - 0.5 mg/dL   Indirect Bilirubin 0.7 0.3 - 0.9 mg/dL  Lipase, blood     Status: None   Collection Time: 04/26/14  9:04 AM  Result Value Ref Range   Lipase 41 11 - 59 U/L  I-stat troponin, ED (not at [facility])     Status: None   Collection Time: 04/26/14  9:15 AM  Result Value Ref Range   Troponin i, poc 0.02 0.00 - 0.08 ng/mL   Comment 3            Comment: Due to the release kinetics of cTnI, a negative result within the first hours of the onset of symptoms does not rule out myocardial infarction with certainty. If myocardial infarction is still suspected, repeat the test at appropriate intervals.   Drug screen panel, emergency     Status: Abnormal   Collection Time: 04/26/14 11:14 AM  Result Value Ref Range   Opiates NONE DETECTED NONE DETECTED   Cocaine POSITIVE (A) NONE DETECTED   Benzodiazepines NONE DETECTED NONE DETECTED   Amphetamines NONE DETECTED NONE DETECTED   Tetrahydrocannabinol NONE DETECTED NONE DETECTED   Barbiturates NONE DETECTED NONE DETECTED    Comment:        DRUG SCREEN FOR MEDICAL PURPOSES ONLY.  IF CONFIRMATION IS NEEDED FOR ANY PURPOSE, NOTIFY LAB WITHIN 5 DAYS.        LOWEST DETECTABLE LIMITS FOR URINE DRUG SCREEN Drug Class       Cutoff (ng/mL) Amphetamine      1000 Barbiturate      200 Benzodiazepine    916 Tricyclics       945 Opiates          300 Cocaine          300 THC              50   I-stat troponin, ED     Status: None   Collection Time: 04/26/14  1:11 PM  Result Value Ref Range   Troponin i, poc 0.02 0.00 - 0.08 ng/mL   Comment 3            Comment: Due to the release kinetics of cTnI, a negative result within the first hours of the onset of symptoms does not rule out myocardial infarction with certainty. If myocardial infarction is still suspected, repeat the test at appropriate intervals.   Troponin I (q 6hr x 3)     Status: Abnormal   Collection Time: 04/26/14  6:42 PM  Result Value Ref Range   Troponin I 0.25 (H) <0.031 ng/mL    Comment:        PERSISTENTLY INCREASED TROPONIN VALUES IN THE RANGE OF 0.04-0.49 ng/mL CAN BE SEEN IN:       -UNSTABLE ANGINA       -CONGESTIVE HEART FAILURE       -MYOCARDITIS       -CHEST TRAUMA       -  ARRYHTHMIAS       -LATE PRESENTING MYOCARDIAL INFARCTION       -COPD   CLINICAL FOLLOW-UP RECOMMENDED.   Phenytoin level, total     Status: Abnormal   Collection Time: 04/26/14  6:42 PM  Result Value Ref Range   Phenytoin Lvl <2.5 (L) 10.0 - 20.0 ug/mL  Troponin I (q 6hr x 3)     Status: Abnormal   Collection Time: 04/26/14 11:53 PM  Result Value Ref Range   Troponin I 0.04 (H) <0.031 ng/mL    Comment:        PERSISTENTLY INCREASED TROPONIN VALUES IN THE RANGE OF 0.04-0.49 ng/mL CAN BE SEEN IN:       -UNSTABLE ANGINA       -CONGESTIVE HEART FAILURE       -MYOCARDITIS       -CHEST TRAUMA       -ARRYHTHMIAS       -LATE PRESENTING MYOCARDIAL INFARCTION       -COPD   CLINICAL FOLLOW-UP RECOMMENDED.   Basic metabolic panel     Status: Abnormal   Collection Time: 04/27/14  6:35 AM  Result Value Ref Range   Sodium 136 135 - 145 mmol/L   Potassium 4.1 3.5 - 5.1 mmol/L   Chloride 105 96 - 112 mmol/L   CO2 23 19 - 32 mmol/L   Glucose, Bld 97 70 - 99 mg/dL   BUN 16 6 - 23 mg/dL   Creatinine, Ser 1.19 0.50 - 1.35 mg/dL    Calcium 8.5 8.4 - 10.5 mg/dL   GFR calc non Af Amer 60 (L) >90 mL/min   GFR calc Af Amer 70 (L) >90 mL/min    Comment: (NOTE) The eGFR has been calculated using the CKD EPI equation. This calculation has not been validated in all clinical situations. eGFR's persistently <90 mL/min signify possible Chronic Kidney Disease.    Anion gap 8 5 - 15  Troponin I (q 6hr x 3)     Status: None   Collection Time: 04/27/14  6:35 AM  Result Value Ref Range   Troponin I 0.03 <0.031 ng/mL    Comment:        NO INDICATION OF MYOCARDIAL INJURY.     Dg Chest 2 View  04/26/2014   CLINICAL DATA:  Left-sided chest pain and shortness of breath since this morning. Bilateral leg swelling 3 days.  EXAM: CHEST  2 VIEW  COMPARISON:  03/01/2014 and 01/01/2014  FINDINGS: Sternotomy wires unchanged. Lungs are adequately inflated without focal consolidation. Mild prominence of the perihilar markings unchanged likely mild degree of vascular congestion. Possible small amount left pleural fluid. Moderate stable cardiomegaly. Remainder of the exam is unchanged.  IMPRESSION: Stable cardiomegaly with suggestion of minimal vascular congestion. Possible small amount left pleural fluid.   Electronically Signed   By: Marin Olp M.D.   On: 04/26/2014 09:45   Ct Angio Chest Aorta W/cm &/or Wo/cm  04/26/2014   CLINICAL DATA:  Left-sided chest and. Rule out dissection. Initial encounter. Left leg swelling. Abdominal bloating. Diabetes. Coronary artery disease.  EXAM: CT ANGIOGRAPHY CHEST, ABDOMEN AND PELVIS  TECHNIQUE: Multidetector CT imaging through the chest, abdomen and pelvis was performed using the standard protocol during bolus administration of intravenous contrast. Multiplanar reconstructed images and MIPs were obtained and reviewed to evaluate the vascular anatomy.  CONTRAST:  164m OMNIPAQUE IOHEXOL 350 MG/ML SOLN  COMPARISON:  Chest radiograph of 04/26/2014. Chest CT 10/23/2013. Abdominal pelvic CT of 07/16/2013.  FINDINGS:  CT  CHEST FINDINGS  Mediastinum/Nodes: Mild bilateral gynecomastia.  Normal caliber the great vessels, with mild atherosclerosis within. Prior median sternotomy for CABG. No evidence of thoracic aortic dissection. Ascending aorta measures 4.4 cm at the sinuses of Valsalva, 3.2 cm at the sino-tubular junction, and 3.8 cm at the mid ascending segment. Felt to be similar.  Moderate cardiomegaly, without pericardial effusion. Pulmonary artery is enlarged, with the outflow tract measuring 4.1 cm. No pulmonary embolism identified.  Mild thoracic adenopathy. Example 1.6 cm precarinal node, unchanged. This is unchanged since the prior exam. No hilar adenopathy.  Lungs/Pleura: Mild motion degradation. Poor inspiratory effort. Mild mosaic attenuation which could relate to air trapping or mild pulmonary venous congestion.  Small left greater than right pleural effusions, increased since 10/23/2013.  Musculoskeletal: No acute osseous abnormality.  CT ABDOMEN PELVIS FINDINGS  Hepatobiliary: Mildly irregular hepatic capsule, suggesting cirrhosis. Normal gallbladder and biliary tract.  Motion degradation continues into the abdomen.  Pancreas: No pancreatic duct dilatation or focal mass. There is interstitial thickening in the peripancreatic space, including on image 143.  Spleen: Normal  Adrenals/Urinary Tract: Normal adrenal glands. Interpolar left renal lesion measures 1.4 cm and greater than fluid density on image 138. This was present back to 03/24/2010, and similar in size on 07/16/2013. Normal right kidney, without hydronephrosis.  Bladder wall is thickened.  High density material within.  Stomach/Bowel: Proximal gastric underdistention. Normal colon, appendix, and terminal ileum. Normal small bowel.  Vascular/Lymphatic: Abdominal aorta is normal in caliber, with atherosclerosis within. Atherosclerotic irregularity at the origin of both renal arteries, without hemodynamically significant stenosis. The inferior mesenteric  artery is patent.  The left internal iliac is significantly narrowed at its origin. No abdominopelvic adenopathy.  Reproductive: Normal prostate.  Other: Small fat containing left inguinal hernia. Small volume abdominal pelvic ascites.  Diffuse anasarca  Musculoskeletal: Degenerative disc disease at the L4-5 level. Multiple lumbosacral disc bulges. S-shaped thoracolumbar spine curvature.  Review of the MIP images confirms the above findings.  IMPRESSION: CT CHEST IMPRESSION  1. Mild motion degradation. 2. No evidence of thoracic aortic dissection. Mild ascending aortic prominence, as detailed above. Recommend annual imaging followup by CTA or MRA. This recommendation follows 2010 ACCF/AHA/AATS/ACR/ASA/SCA/SCAI/SIR/STS/SVM Guidelines for the Diagnosis and Management of Patients With Thoracic Aortic Disease. Circulation. 2010; 121: E332-R518 3. Probable congestive heart failure, with bilateral pleural effusions. 4. Pulmonary artery enlargement suggests pulmonary arterial hypertension. 5. Similar mediastinal adenopathy. This could be related to congestive heart failure. Recommend attention on follow-up.  CT ABDOMEN AND PELVIS IMPRESSION  1. Mild motion degradation. 2. Advanced aortic atherosclerosis, without dissection or aneurysm. 3. Abdominal pelvic ascites.  Suspect mild cirrhosis. 4. Peripancreatic interstitial thickening. Cannot exclude pancreatitis. This appearance is nonspecific in the setting of ascites. 5. Pericystic edema. This could represent cystitis. Increased density within the urinary bladder could represent Early contrast excretion or hemorrhage. Consider correlation with urinalysis.   Electronically Signed   By: Abigail Miyamoto M.D.   On: 04/26/2014 11:44   Ct Cta Abd/pel W/cm &/or W/o Cm  04/26/2014   CLINICAL DATA:  Left-sided chest and. Rule out dissection. Initial encounter. Left leg swelling. Abdominal bloating. Diabetes. Coronary artery disease.  EXAM: CT ANGIOGRAPHY CHEST, ABDOMEN AND PELVIS   TECHNIQUE: Multidetector CT imaging through the chest, abdomen and pelvis was performed using the standard protocol during bolus administration of intravenous contrast. Multiplanar reconstructed images and MIPs were obtained and reviewed to evaluate the vascular anatomy.  CONTRAST:  169m OMNIPAQUE IOHEXOL 350 MG/ML SOLN  COMPARISON:  Chest  radiograph of 04/26/2014. Chest CT 10/23/2013. Abdominal pelvic CT of 07/16/2013.  FINDINGS: CT CHEST FINDINGS  Mediastinum/Nodes: Mild bilateral gynecomastia.  Normal caliber the great vessels, with mild atherosclerosis within. Prior median sternotomy for CABG. No evidence of thoracic aortic dissection. Ascending aorta measures 4.4 cm at the sinuses of Valsalva, 3.2 cm at the sino-tubular junction, and 3.8 cm at the mid ascending segment. Felt to be similar.  Moderate cardiomegaly, without pericardial effusion. Pulmonary artery is enlarged, with the outflow tract measuring 4.1 cm. No pulmonary embolism identified.  Mild thoracic adenopathy. Example 1.6 cm precarinal node, unchanged. This is unchanged since the prior exam. No hilar adenopathy.  Lungs/Pleura: Mild motion degradation. Poor inspiratory effort. Mild mosaic attenuation which could relate to air trapping or mild pulmonary venous congestion.  Small left greater than right pleural effusions, increased since 10/23/2013.  Musculoskeletal: No acute osseous abnormality.  CT ABDOMEN PELVIS FINDINGS  Hepatobiliary: Mildly irregular hepatic capsule, suggesting cirrhosis. Normal gallbladder and biliary tract.  Motion degradation continues into the abdomen.  Pancreas: No pancreatic duct dilatation or focal mass. There is interstitial thickening in the peripancreatic space, including on image 143.  Spleen: Normal  Adrenals/Urinary Tract: Normal adrenal glands. Interpolar left renal lesion measures 1.4 cm and greater than fluid density on image 138. This was present back to 03/24/2010, and similar in size on 07/16/2013. Normal  right kidney, without hydronephrosis.  Bladder wall is thickened.  High density material within.  Stomach/Bowel: Proximal gastric underdistention. Normal colon, appendix, and terminal ileum. Normal small bowel.  Vascular/Lymphatic: Abdominal aorta is normal in caliber, with atherosclerosis within. Atherosclerotic irregularity at the origin of both renal arteries, without hemodynamically significant stenosis. The inferior mesenteric artery is patent.  The left internal iliac is significantly narrowed at its origin. No abdominopelvic adenopathy.  Reproductive: Normal prostate.  Other: Small fat containing left inguinal hernia. Small volume abdominal pelvic ascites.  Diffuse anasarca  Musculoskeletal: Degenerative disc disease at the L4-5 level. Multiple lumbosacral disc bulges. S-shaped thoracolumbar spine curvature.  Review of the MIP images confirms the above findings.  IMPRESSION: CT CHEST IMPRESSION  1. Mild motion degradation. 2. No evidence of thoracic aortic dissection. Mild ascending aortic prominence, as detailed above. Recommend annual imaging followup by CTA or MRA. This recommendation follows 2010 ACCF/AHA/AATS/ACR/ASA/SCA/SCAI/SIR/STS/SVM Guidelines for the Diagnosis and Management of Patients With Thoracic Aortic Disease. Circulation. 2010; 121: X540-G867 3. Probable congestive heart failure, with bilateral pleural effusions. 4. Pulmonary artery enlargement suggests pulmonary arterial hypertension. 5. Similar mediastinal adenopathy. This could be related to congestive heart failure. Recommend attention on follow-up.  CT ABDOMEN AND PELVIS IMPRESSION  1. Mild motion degradation. 2. Advanced aortic atherosclerosis, without dissection or aneurysm. 3. Abdominal pelvic ascites.  Suspect mild cirrhosis. 4. Peripancreatic interstitial thickening. Cannot exclude pancreatitis. This appearance is nonspecific in the setting of ascites. 5. Pericystic edema. This could represent cystitis. Increased density within the  urinary bladder could represent Early contrast excretion or hemorrhage. Consider correlation with urinalysis.   Electronically Signed   By: Abigail Miyamoto M.D.   On: 04/26/2014 11:44    Review of Systems  Constitutional: Negative for fever and chills.  HENT: Negative for congestion and sore throat.   Respiratory: Positive for shortness of breath. Negative for cough.   Cardiovascular: Positive for chest pain, orthopnea and leg swelling.  Gastrointestinal: Positive for nausea and abdominal pain. Negative for vomiting.  Musculoskeletal: Positive for myalgias (Lower extremities).  Neurological: Positive for dizziness and weakness.  All other systems reviewed and are negative.  Blood pressure 160/72, pulse 6, temperature 97.9 F (36.6 C), temperature source Oral, resp. rate 20, height _0  (1.651 m), weight 178 lb (80.74 kg), SpO2 98 %. Physical Exam  Nursing note and vitals reviewed. Constitutional: He appears well-developed and well-nourished. No distress.  Patient was sleeping soundly when I enter the room.  HENT:  Head: Normocephalic and atraumatic.  Eyes: EOM are normal. Pupils are equal, round, and reactive to light. No scleral icterus.  Neck: Normal range of motion. Neck supple. JVD present.  Cardiovascular: Normal rate, regular rhythm, S1 normal and S2 normal.   No murmur heard. Pulses:      Radial pulses are 2+ on the right side, and 2+ on the left side.       Dorsalis pedis pulses are 2+ on the right side, and 2+ on the left side.  Respiratory: Effort normal and breath sounds normal. He has no wheezes. He has no rales.  GI: Bowel sounds are normal. He exhibits distension. There is tenderness.  Abdomen firm  Musculoskeletal: He exhibits edema (1+ tense edema).  Lymphadenopathy:    He has no cervical adenopathy.  Neurological: He is alert. He exhibits normal muscle tone.  Skin: Skin is warm and dry.  Psychiatric: He has a normal mood and affect.    Assessment/Plan:    Acute  on chronic systolic and diastolic CHF (congestive heart failure)   CAD- CABG x3 '09. Myoview no ischemia 11/27/13   Moderate to severe pulmonary hypertension   Polysubstance abuse   Seizure disorder   Chest Pain    69 year old male with history of coronary artery disease status post coronary artery bypass grafting in 2009, chronic combined systolic and diastolic heart failure, stage III kidney disease, GI bleeding, hyperlipidemia, obstructive sleep apnea not using CPAP, ischemic cardiomyopathy EF of 40-45%(last echo 11/27/2013), moderate severe pulmonary hypertension, polysubstance abuse, peripheral vascular disease, seizure disorder, diabetes.  His last nuclear stress test was September 2015 there was no reversible ischemia with a fixed large inferior wall defect. EF estimated at 35%.  His discharge weight in June 2015 was 168 pounds and he is 178 pounds today.  Patient presents with volume overloaded and sharp chest pain. BNP is elevated at 2313. Initial troponin was 0.25 and trended down to normal.  His urine test came back positive for cocaine.  EKG shows nonspecific T-wave abnormalities with inferior Q waves.  There is no evidence of dissection on CT angiogram of the chest. CT of the abdomen and pelvis shows abdominal pelvic ascites peripancreatic interstitial thickening pericystic edema.    Recommend continued diuresis with IV Lasix.  Suspect his troponin elevation is related to heart failure as his chest pain seems noncardiac and more abdominal.  Recent stress test showed no reversible ischemia.   BP is labile.  Last one good.  Very tender in the RUQ.  From acites?  Lipase WNL.     Tarri Fuller, PA-C 04/27/2014, 2:35 PM   I have seen and examined the patient along with HAGER, BRYAN, PA-C. I have reviewed the chart, notes and new data.  I agree with PA's note.  Agree that the mild cardiac enzyme abnormality reflects CHF rather than a new coronary event. Cocaine abuse contributory. No clear  indication for full anticoagulation, but would use DVT prophylaxis. Avoid "pure" beta blockers, continue nitrates, vasodilators and clonidine as BP allows   Sanda Klein, MD, [employer] and [place] [phone] 04/27/2014, 4:30 PM

## 2014-04-27 NOTE — Progress Notes (Signed)
Patient became upset when discussing fluid restriction with this RN.  Patient adamant that he "didn't drink that much today".  Explained importance of intake and output to patient, who agreed to use urinal.  Showed patient copy of his intake and output from today and patient voiced understanding of 121mL fluid restriction.  Patient calmed and relaxing comfortably in room.  Will continue to monitor.

## 2014-04-28 DIAGNOSIS — R0789 Other chest pain: Secondary | ICD-10-CM

## 2014-04-28 DIAGNOSIS — F1910 Other psychoactive substance abuse, uncomplicated: Secondary | ICD-10-CM

## 2014-04-28 DIAGNOSIS — I5031 Acute diastolic (congestive) heart failure: Secondary | ICD-10-CM

## 2014-04-28 DIAGNOSIS — I509 Heart failure, unspecified: Secondary | ICD-10-CM | POA: Insufficient documentation

## 2014-04-28 LAB — BASIC METABOLIC PANEL
ANION GAP: 10 (ref 5–15)
BUN: 20 mg/dL (ref 6–23)
CHLORIDE: 105 mmol/L (ref 96–112)
CO2: 22 mmol/L (ref 19–32)
Calcium: 8.8 mg/dL (ref 8.4–10.5)
Creatinine, Ser: 1.35 mg/dL (ref 0.50–1.35)
GFR calc Af Amer: 60 mL/min — ABNORMAL LOW (ref >90)
GFR calc non Af Amer: 52 mL/min — ABNORMAL LOW (ref >90)
Glucose, Bld: 75 mg/dL (ref 70–99)
POTASSIUM: 4.7 mmol/L (ref 3.5–5.1)
Sodium: 137 mmol/L (ref 135–145)

## 2014-04-28 MED ORDER — MUPIROCIN 2 % EX OINT
1.0000 "application " | TOPICAL_OINTMENT | Freq: Two times a day (BID) | CUTANEOUS | Status: DC
  Administered 2014-04-28 – 2014-04-29 (×2): 1 via NASAL
  Filled 2014-04-28: qty 22

## 2014-04-28 MED ORDER — CHLORHEXIDINE GLUCONATE CLOTH 2 % EX PADS
6.0000 | MEDICATED_PAD | Freq: Every day | CUTANEOUS | Status: DC
  Administered 2014-04-29: 6 via TOPICAL

## 2014-04-28 NOTE — Progress Notes (Signed)
Patient high fall risk according to algorithm.  Patient refusing bed alarm or assistance with ambulation at this time.  Patient steady on feet but has had multiple falls at home within past 6 months.  Patient educated about fall risk and about calling for help prior to ambulating but states, "I can do it myself".  Will continue to monitor.

## 2014-04-28 NOTE — Progress Notes (Signed)
[facility] PROGRESS NOTE  Charles [name] HUT:[id number] DOB: 01/30/1946 DOA: 04/26/2014 PCP: [name]  Assessment/Plan: 1. Acutely decompensated diastolic CHF 1. Dry wt of 168lbs (76kg) noted 2. Wt of 80.2 kg today 3. Continue IV lasix as tolerated 4. Good urine output 5. Follow renal function 2. Cocaine abuse 1. Cessation done at bedside 2. Pt claims last using one week ago 3. Patient stated he will quit 3. Chest pain 1. Peak trop noted to be 0.25 2. Stress test in 9/15 was unremarkable 3. Have consulted Cardiology 4. Recs to cont CHF mgt 5. Elevated trop likely secondary to acute CHF 4. Mod-severe pulm htn 1. Diuresis per above 2. Remains stable 5. CAD s/p CABGx3 1. Presently pain free 6. DVT prophylaxis  Code Status: Full Family Communication: Pt in room Disposition Plan: Pending   Consultants:  Cardiology  Procedures:    Antibiotics:  none (indicate start date, and stop date if known)  HPI/Subjective: Pt without acute events overnight. States he feels somewhat better today  Objective: Filed Vitals:   04/27/14 2128 04/28/14 0206 04/28/14 0455 04/28/14 1021  BP: 118/71 124/66 139/66 160/73  Pulse: 74 82 66 67  Temp: 98.2 F (36.8 C) 97.9 F (36.6 C) 97.5 F (36.4 C)   TempSrc: Oral Oral Oral   Resp: 20 18 20    Height:      Weight:   80.287 kg (177 lb)   SpO2: 97% 100% 97% 97%    Intake/Output Summary (Last 24 hours) at 04/28/14 1504 Last data filed at 04/28/14 1416  Gross per 24 hour  Intake   1786 ml  Output   2975 ml  Net  -1189 ml   Filed Weights   04/26/14 1740 04/27/14 0543 04/28/14 0455  Weight: 81.784 kg (180 lb 4.8 oz) 80.74 kg (178 lb) 80.287 kg (177 lb)    Exam:   General:  Ambulatory in room, in nad  Cardiovascular: regular, s1, s2  Respiratory: normal resp effort, no wheezing  Abdomen: soft,nondistended  Musculoskeletal: perfused,no clubbing   Data Reviewed: Basic Metabolic Panel:  Recent  Labs Lab 04/26/14 0904 04/27/14 0635 04/28/14 0522  NA 136 136 137  [name] 4.1 4.1 4.7  CL 105 105 105  CO2 25 23 22   GLUCOSE 98 97 75  BUN 18 16 20   CREATININE 1.13 1.19 1.35  CALCIUM 8.3* 8.5 8.8   Liver Function Tests:  Recent Labs Lab 04/26/14 0904  AST 42*  ALT 30  ALKPHOS 87  BILITOT 1.2  PROT 7.2  ALBUMIN 3.0*    Recent Labs Lab 04/26/14 0904  LIPASE 41   No results for input(s): AMMONIA in the last 168 hours. CBC:  Recent Labs Lab 04/26/14 0904  WBC 5.4  HGB 8.7*  HCT 28.6*  MCV 82.7  PLT 310   Cardiac Enzymes:  Recent Labs Lab 04/26/14 1842 04/26/14 2353 04/27/14 0635  TROPONINI 0.25* 0.04* 0.03   BNP (last 3 results)  Recent Labs  04/26/14 0904  BNP 2313.3*    ProBNP (last 3 results)  Recent Labs  01/01/14 1107 02/07/14 0123 03/01/14 2250  PROBNP 3225.0* 2648.0* 7421.0*    CBG: No results for input(s): GLUCAP in the last 168 hours.  Recent Results (from the past 240 hour(s))  MRSA PCR Screening     Status: Abnormal   Collection Time: 04/27/14 11:51 AM  Result Value Ref Range Status   MRSA by PCR POSITIVE (A) NEGATIVE Final    Comment:  The GeneXpert MRSA Assay (FDA approved for NASAL specimens only), is one component of a comprehensive MRSA colonization surveillance program. It is not intended to diagnose MRSA infection nor to guide or monitor treatment for MRSA infections. RESULT CALLED TO, READ BACK BY AND VERIFIED WITH: A. MAGRITAND RN 14:50 04/27/14 ([email or web address])      Studies: No results found.  Scheduled Meds: . amLODipine  10 mg Oral Daily  . aspirin EC  81 mg Oral Daily  . Chlorhexidine Gluconate Cloth  6 each Topical Q0600  . citalopram  20 mg Oral Daily  . cloNIDine  0.1 mg Oral BID  . ferrous sulfate  325 mg Oral BID WC  . furosemide  40 mg Intravenous BID  . heparin  5,000 Units Subcutaneous 3 times per day  . hydrALAZINE  75 mg Oral TID  . isosorbide mononitrate  30 mg Oral Daily  . mupirocin  ointment  1 application Nasal BID  . pantoprazole  40 mg Oral Daily  . phenytoin  200 mg Oral Daily  . phenytoin  300 mg Oral QHS  . potassium chloride  20 mEq Oral BID  . pravastatin  40 mg Oral q1800  . sodium chloride  3 mL Intravenous Q12H   Continuous Infusions:   Principal Problem:   Acute diastolic CHF (congestive heart failure) Active Problems:   CAD- CABG x3 '09. 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 If 7PM-7AM, please contact night-coverage at [email or web address], password [email or web address] 04/28/2014, 3:04 PM  LOS: 2 days

## 2014-04-28 NOTE — Progress Notes (Signed)
Patient Profile: 69 year old male with history of coronary artery disease status post coronary artery bypass grafting in 2009, chronic combined systolic and diastolic heart failure, stage III kidney disease, GI bleeding, hyperlipidemia, obstructive sleep apnea not using CPAP, ischemic cardiomyopathy EF of 40-45%(last echo 11/27/2013), moderate severe pulmonary hypertension, polysubstance abuse, peripheral vascular disease, seizure disorder, diabetes. His last nuclear stress test was September 2015 there was no reversible ischemia with a fixed large inferior wall defect. EF estimated at 35%.   Admitted for CP. Troponins minimally elevated x 2 at 0.25 and 0.04. Repeat 3d troponin negative. UDS positive for cocaine.   Subjective: Pt notes feeling very poorly. Complaining of chest pain and dyspnea. Grabbing his chest and moaning.   Objective: Vital signs in last 24 hours: Temp:  [97.4 F (36.3 C)-98.2 F (36.8 C)] 97.5 F (36.4 C) (02/09 0455) Pulse Rate:  [6-82] 66 (02/09 0455) Resp:  [18-20] 20 (02/09 0455) BP: (101-160)/(58-72) 139/66 mmHg (02/09 0455) SpO2:  [97 %-100 %] 97 % (02/09 0455) Weight:  [177 lb (80.287 kg)] 177 lb (80.287 kg) (02/09 0455) Last BM Date: 04/27/14  Intake/Output from previous day: 02/08 0701 - 02/09 0700 In: 5361 [P.O.:1740; I.V.:3] Out: 2075 [Urine:2075] Intake/Output this shift:    Medications Current Facility-Administered Medications  Medication Dose Route Frequency Provider Last Rate Last Dose  . 0.9 %  sodium chloride infusion  250 mL Intravenous PRN Jonetta Osgood, MD      . acetaminophen (TYLENOL) tablet 650 mg  650 mg Oral Q4H PRN Shanker Kristeen Mans, MD      . albuterol (PROVENTIL) (2.5 MG/3ML) 0.083% nebulizer solution 2.5 mg  2.5 mg Nebulization Q2H PRN Jonetta Osgood, MD      . amLODipine (NORVASC) tablet 10 mg  10 mg Oral Daily Jonetta Osgood, MD   10 mg at 04/27/14 1050  . aspirin EC tablet 81 mg  81 mg Oral Daily Jonetta Osgood, MD   81 mg at 04/27/14 1050  . Chlorhexidine Gluconate Cloth 2 % PADS 6 each  6 each Topical Q0600 Donne Hazel, MD      . citalopram (CELEXA) tablet 20 mg  20 mg Oral Daily Jonetta Osgood, MD   20 mg at 04/27/14 1052  . cloNIDine (CATAPRES) tablet 0.1 mg  0.1 mg Oral BID Jonetta Osgood, MD   0.1 mg at 04/27/14 2140  . ferrous sulfate tablet 325 mg  325 mg Oral BID WC Jonetta Osgood, MD   325 mg at 04/27/14 1700  . furosemide (LASIX) injection 40 mg  40 mg Intravenous BID Jonetta Osgood, MD   40 mg at 04/27/14 1907  . heparin injection 5,000 Units  5,000 Units Subcutaneous 3 times per day Jonetta Osgood, MD   5,000 Units at 04/26/14 2216  . hydrALAZINE (APRESOLINE) tablet 75 mg  75 mg Oral TID Jonetta Osgood, MD   75 mg at 04/27/14 2303  . isosorbide mononitrate (IMDUR) 24 hr tablet 30 mg  30 mg Oral Daily Jonetta Osgood, MD   30 mg at 04/27/14 1051  . morphine 2 MG/ML injection 1 mg  1 mg Intravenous Q4H PRN Jonetta Osgood, MD   1 mg at 04/28/14 0502  . mupirocin ointment (BACTROBAN) 2 % 1 application  1 application Nasal BID Donne Hazel, MD      . nitroGLYCERIN (NITROSTAT) SL tablet 0.4 mg  0.4 mg Sublingual Q5 min PRN Jonetta Osgood, MD      .  ondansetron (ZOFRAN) injection 4 mg  4 mg Intravenous Q6H PRN Jonetta Osgood, MD      . oxyCODONE (Oxy IR/ROXICODONE) immediate release tablet 5 mg  5 mg Oral Q6H PRN Jonetta Osgood, MD   5 mg at 04/27/14 2156  . pantoprazole (PROTONIX) EC tablet 40 mg  40 mg Oral Daily Jonetta Osgood, MD   40 mg at 04/27/14 1111  . phenytoin (DILANTIN) ER capsule 200 mg  200 mg Oral Daily Jonetta Osgood, MD   200 mg at 04/27/14 1051  . phenytoin (DILANTIN) ER capsule 300 mg  300 mg Oral QHS Jonetta Osgood, MD   300 mg at 04/27/14 2140  . potassium chloride SA (K-DUR,KLOR-CON) CR tablet 20 mEq  20 mEq Oral BID Jonetta Osgood, MD   20 mEq at 04/27/14 2140  . pravastatin (PRAVACHOL) tablet 40 mg  40 mg Oral q1800  Jonetta Osgood, MD   40 mg at 04/27/14 1800  . sodium chloride 0.9 % injection 3 mL  3 mL Intravenous Q12H Jonetta Osgood, MD   3 mL at 04/27/14 2156  . sodium chloride 0.9 % injection 3 mL  3 mL Intravenous PRN Shanker Kristeen Mans, MD        PE: General appearance: alert, cooperative and no distress Neck: no carotid bruit and no JVD Lungs: clear to auscultation bilaterally Heart: regular rate and rhythm, S1, S2 normal, no murmur, click, rub or gallop Extremities: tense bilateral edema Pulses: 2+ and symmetric Skin: warm and dry Neurologic: Grossly normal  Lab Results:   Recent Labs  04/26/14 0904  WBC 5.4  HGB 8.7*  HCT 28.6*  PLT 310   BMET  Recent Labs  04/26/14 0904 04/27/14 0635 04/28/14 0522  NA 136 136 137  K 4.1 4.1 4.7  CL 105 105 105  CO2 25 23 22   GLUCOSE 98 97 75  BUN 18 16 20   CREATININE 1.13 1.19 1.35  CALCIUM 8.3* 8.5 8.8   Cardiac Panel (last 3 results)  Recent Labs  04/26/14 1842 04/26/14 2353 04/27/14 0635  TROPONINI 0.25* 0.04* 0.03    Assessment/Plan  Principal Problem:   Acute diastolic CHF (congestive heart failure) Active Problems:   CAD- CABG x3 '09. Myoview no ischemia 11/27/13   Moderate to severe pulmonary hypertension   Polysubstance abuse   Seizure disorder   1. Chest Pain: atypical characteristics. Pt notes sharp pain. He had a low risk Myoview 11/2013. Initial troponins mildly elevated but f/u troponin returned to normal. However patient now with active CP. Will order stat EKG for reassurance. RN notified.   2. Elevated Troponin: minimally elevated x 2 at 0.25 and 0.04. 3rd troponin returned normal. Mild cardiac enzyme abnormality likely reflects CHF rather than a new coronary event. Cocaine abuse likely contributory as well.   3. Acute on Chronic Systolic CHF: last known EF 40-45% 11/2013. BNP elevated on admit at 2313. Continue with Lasix.  He he is diuresing well.- 2L in urine output since admit yesterday. Renal  function stable.   Avoid "pure' beta blockers given cocaine use. Can continue nitrates, vasodilators and clonidine    LOS: 2 days    Charles [name] 04/28/2014 9:14 AM  Patient seen and examined and history reviewed. Agree with above findings and plan. Complains of chest pain. Pain is sharp and stabbing. Localized to inferior border of rib cage on left. Complains of swelling in his legs. His chest pain is not cardiac. Mildly elevated  troponin probably related to cocaine use and CHF. He is diuresing well. Continue lasix. He does not need further ischemic work up.  Charles [name], Charles [name] 04/28/2014 12:39 PM

## 2014-04-29 LAB — BASIC METABOLIC PANEL
Anion gap: 11 (ref 5–15)
BUN: 18 mg/dL (ref 6–23)
CO2: 23 mmol/L (ref 19–32)
Calcium: 8.8 mg/dL (ref 8.4–10.5)
Chloride: 101 mmol/L (ref 96–112)
Creatinine, Ser: 1.27 mg/dL (ref 0.50–1.35)
GFR calc Af Amer: 64 mL/min — ABNORMAL LOW (ref >90)
GFR calc non Af Amer: 56 mL/min — ABNORMAL LOW (ref >90)
GLUCOSE: 96 mg/dL (ref 70–99)
POTASSIUM: 4.3 mmol/L (ref 3.5–5.1)
Sodium: 135 mmol/L (ref 135–145)

## 2014-04-29 LAB — HIV ANTIBODY (ROUTINE TESTING W REFLEX): HIV Screen 4th Generation wRfx: NONREACTIVE

## 2014-04-29 NOTE — Care Management Utilization Note (Signed)
We have been notified by Lin Landsman that Mr Caggiano has appealed his discharge. The Care Management department is getting the requested information to them via fax. Mr Gulbranson has been notified and acknowledged by signature the Detailed Notice of Discharge.

## 2014-04-29 NOTE — Discharge Instructions (Signed)
Follow with Primary MD [name] in 7 days   Get CBC, CMP, UA,  2 view Chest X ray checked  by Primary MD next visit.    Activity: As tolerated with Full fall precautions use walker/cane & assistance as needed   Disposition Home     Diet: Heart Healthy - Check your Weight same time everyday, if you gain over 2 pounds, or you develop in leg swelling, experience more shortness of breath or chest pain, call your Primary MD immediately. Follow Cardiac Low Salt Diet and 1.5 lit/day fluid restriction.   On your next visit with your primary care physician please Get Medicines reviewed and adjusted.   Please request your Prim.MD to go over all Hospital Tests and Procedure/Radiological results at the follow up, please get all Hospital records sent to your Prim MD by signing hospital release before you go home.   If you experience worsening of your admission symptoms, develop shortness of breath, life threatening emergency, suicidal or homicidal thoughts you must seek medical attention immediately by calling 911 or calling your MD immediately  if symptoms less severe.  You Must read complete instructions/literature along with all the possible adverse reactions/side effects for all the Medicines you take and that have been prescribed to you. Take any new Medicines after you have completely understood and accpet all the possible adverse reactions/side effects.   Do not drive, operating heavy machinery, perform activities at heights, swimming or participation in water activities or provide baby sitting services if your were admitted for syncope or siezures until you have seen by Primary MD or a Neurologist and advised to do so again.  Do not drive when taking Pain medications.    Do not take more than prescribed Pain, Sleep and Anxiety Medications  Special Instructions: If you have smoked or chewed Tobacco  in the last 2 yrs please stop smoking, stop any regular Alcohol  and or any  Recreational drug use.  Wear Seat belts while driving.   Please note  You were cared for by a hospitalist during your hospital stay. If you have any questions about your discharge medications or the care you received while you were in the hospital after you are discharged, you can call the unit and asked to speak with the hospitalist on call if the hospitalist that took care of you is not available. Once you are discharged, your primary care physician will handle any further medical issues. Please note that NO REFILLS for any discharge medications will be authorized once you are discharged, as it is imperative that you return to your primary care physician (or establish a relationship with a primary care physician if you do not have one) for your aftercare needs so that they can reassess your need for medications and monitor your lab values.

## 2014-04-29 NOTE — Discharge Summary (Addendum)
Charles [name], is a 69 y.o. male  DOB [birth year]  MRN [id number].  Admission date:  04/26/2014  Admitting Physician  Jonetta Osgood, MD  Discharge Date:  04/29/2014   Primary MD  [name]  Recommendations for primary care physician for things to follow:   Repeat CBC, CMP, UA and a 2 view chest x-ray next visit.  Request setting of one-time outpatient GI follow-up for possible underlying cirrhosis and ascites. Cardiology follow-up post discharge as well.    Admission Diagnosis  CHF exacerbation [I50.9] Abdominal pain [R10.9] Chest pain [R07.9]   Discharge Diagnosis  CHF exacerbation [I50.9] Abdominal pain [R10.9] Chest pain [R07.9]     Principal Problem:   Acute diastolic CHF (congestive heart failure) Active Problems:   CAD- CABG x3 '09. Myoview no ischemia 11/27/13   Moderate to severe pulmonary hypertension   Polysubstance abuse   Seizure disorder   CHF exacerbation      Past Medical History  Diagnosis Date  . Iron deficiency anemia   . Chronic combined systolic and diastolic CHF, NYHA class 2     a. 11/2013 Echo: EF 40-45%, basl-mid inflat AK, Gr 2 DD.  [name] Hyperlipidemia LDL goal <70   . Obstructive sleep apnea     a. not on home CPAP  . Ischemic cardiomyopathy     a. 11/2013 Echo: EF 40-45%, basl-mid inflat AK, Gr 2 DD, mild AI, mod dil LA/RA, mod reduced RV fxn, PASP 90mmHg.  [name] Homelessness   . Moderate to severe pulmonary hypertension 03/2010    a. PA peak pressure of 76 mmHg (per 2D Echo 03/2010)  . Polysubstance abuse     a. cocaine, THC  . AV malformation of gastrointestinal tract     a. w/ h/o GIB.  [name] Family history of early CAD   . DDD (degenerative disc disease), lumbar   . Peripheral vascular disease   . Seizure disorder   . Hypertension   . CAD (coronary artery  disease)     a. s/p 3-vessel CABG (12/2007) // 100% RCA stenosis with collaterals from left system. Severe bifurcation lesions of proxima CXA and OM. Moderate LAD disease - followed by Dr. Wyline Copas in [facility];  b. 11/2013 Myoview: Large fixed inf defect w/o ischemia, EF 35%.  . Diabetes   . Chronic kidney disease (CKD), stage III (moderate)     a. BL SCr 1.5-1.6  . Renal artery stenosis   . Seizures     Past Surgical History  Procedure Laterality Date  . Apc  03/2010    To treat small bowel AVMs  . Esophagogastroduodenoscopy N/A 08/14/2012    Procedure: ESOPHAGOGASTRODUODENOSCOPY (EGD);  Surgeon: [name], MD;  Location: [facility] ENDOSCOPY;  Service: Endoscopy;  Laterality: N/A;  . Hot hemostasis N/A 08/14/2012    Procedure: HOT HEMOSTASIS (ARGON PLASMA COAGULATION/BICAP);  Surgeon: [name], MD;  Location: [facility] ENDOSCOPY;  Service: Endoscopy;  Laterality: N/A;  . Esophagogastroduodenoscopy (egd) with propofol N/A 08/04/2013    Procedure: ESOPHAGOGASTRODUODENOSCOPY (EGD) WITH PROPOFOL;  Surgeon:  [name], MD;  Location: [facility] ENDOSCOPY;  Service: Endoscopy;  Laterality: N/A;  . Coronary artery bypass graft  12/2007       History of present illness and  Hospital Course:     Kindly see H&P for history of present illness and admission details, please review complete Labs, Consult reports and Test reports for all details in brief  HPI  from the history and physical done on the day of admission  Charles [name] is a 69 y.o. male with a Past Medical History of chronic combined diastolic/systolic heart failure, ischemic cardiomyopathy, dyslipidemia, ongoing cocaine use who presents today with the above noted complaint. Per patient, for the past 3 days he has had significant worsening of his shortness of breath, patient claims that even walking around 20-30 yards make him short of breath. During this time, he has gained approximately 3 pounds and has had significant swelling of his bilateral  lower extremities. This morning, he had abdominal and chest pain, as a result he presented to the emergency room for further evaluation and treatment. Patient describes the chest pain has occurring on the left side of his chest, and mostly describes it as chest tightness. There is no radiation, no nausea, no vomiting, pain was approximately 10/10 at its worse. Patient's abdominal pain and chest pain now have resolved. Patient was evaluated in the emergency room, EDMD subsequently ordered a CT angiogram of his chest and abdomen which was negative for any significant abnormalities. Patient BNP was also significantly elevated, chest x-ray and CT chest was consistent with acute pulmonary edema, I was asked to admit this patient for further evaluation and treatment. Patient denies any headache, fever, nausea, vomiting, diarrhea or dysuria.   Hospital Course    1. Acute on chronic mind systolic and diastolic CHF. EF 40-45%. Seen by cardiology. Mild troponin rise which was a non-ACS pattern. All due to dietary noncompliance and cocaine abuse. Counseled to quit all. Has responded well to diuresis close to his dry weight, not on oxygen requirement, have placed him back on his home dose oral diuretic with salt and fluid restriction. Continue aspirin, statin, hydralazine. No beta blocker due to ongoing cocaine abuse. Seen by cardiology cleared for discharge. Recent echo noted.   2. Cocaine abuse. Counseled to quit.   3. CAD status post CABG 3. Plan as in above in #1. No further testing per cardiology.   4. Question mild underlying cirrhosis and ascites. Request PCP to arrange for one-time outpatient GI follow-up.   5. Possible bladder thickening on CT scan. No dysuria. Check UA outpatient by PCP and address appropriately.    Discharge Condition: Stable   Follow UP  Follow-up Information    Follow up with [name]. Schedule an appointment as soon as possible for a visit in 1 week.    Specialty:  Family Medicine   Contact information:   [address] [address] [phone]       Follow up with Candee Furbish, MD. Schedule an appointment as soon as possible for a visit in 1 week.   Specialty:  Cardiology   Contact information:   [address] [place] [address] [phone]       Follow up with Silvano Rusk, MD. Schedule an appointment as soon as possible for a visit in 1 week.   Specialty:  Gastroenterology   Contact information:   [address] [place] [address] [phone]  Discharge Instructions  and  Discharge Medications          Discharge Instructions    Discharge instructions    Complete by:  As directed   Follow with Primary MD [name] in 7 days   Get CBC, CMP, UA,  2 view Chest X ray checked  by Primary MD next visit.    Activity: As tolerated with Full fall precautions use walker/cane & assistance as needed   Disposition Home     Diet: Heart Healthy - Check your Weight same time everyday, if you gain over 2 pounds, or you develop in leg swelling, experience more shortness of breath or chest pain, call your Primary MD immediately. Follow Cardiac Low Salt Diet and 1.5 lit/day fluid restriction.   On your next visit with your primary care physician please Get Medicines reviewed and adjusted.   Please request your Prim.MD to go over all Hospital Tests and Procedure/Radiological results at the follow up, please get all Hospital records sent to your Prim MD by signing hospital release before you go home.   If you experience worsening of your admission symptoms, develop shortness of breath, life threatening emergency, suicidal or homicidal thoughts you must seek medical attention immediately by calling 911 or calling your MD immediately  if symptoms less severe.  You Must read complete instructions/literature along with all the possible adverse reactions/side effects for all the  Medicines you take and that have been prescribed to you. Take any new Medicines after you have completely understood and accpet all the possible adverse reactions/side effects.   Do not drive, operating heavy machinery, perform activities at heights, swimming or participation in water activities or provide baby sitting services if your were admitted for syncope or siezures until you have seen by Primary MD or a Neurologist and advised to do so again.  Do not drive when taking Pain medications.    Do not take more than prescribed Pain, Sleep and Anxiety Medications  Special Instructions: If you have smoked or chewed Tobacco  in the last 2 yrs please stop smoking, stop any regular Alcohol  and or any Recreational drug use.  Wear Seat belts while driving.   Please note  You were cared for by a hospitalist during your hospital stay. If you have any questions about your discharge medications or the care you received while you were in the hospital after you are discharged, you can call the unit and asked to speak with the hospitalist on call if the hospitalist that took care of you is not available. Once you are discharged, your primary care physician will handle any further medical issues. Please note that NO REFILLS for any discharge medications will be authorized once you are discharged, as it is imperative that you return to your primary care physician (or establish a relationship with a primary care physician if you do not have one) for your aftercare needs so that they can reassess your need for medications and monitor your lab values.     Increase activity slowly    Complete by:  As directed             Medication List    STOP taking these medications        azithromycin 250 MG tablet  Commonly known as:  ZITHROMAX      TAKE these medications        amLODipine 10 MG tablet  Commonly known as:  NORVASC  Take 10 mg by mouth daily.  aspirin EC 81 MG tablet  Take 81 mg by mouth  daily.     citalopram 20 MG tablet  Commonly known as:  CELEXA  Take 20 mg by mouth daily.     cloNIDine 0.2 MG tablet  Commonly known as:  CATAPRES  Take 0.5 tablets (0.1 mg total) by mouth 2 (two) times daily.     docusate sodium 100 MG capsule  Commonly known as:  COLACE  Take 100 mg by mouth 2 (two) times daily.     ferrous sulfate 325 (65 FE) MG tablet  Take 325 mg by mouth 2 (two) times daily with a meal.     furosemide 80 MG tablet  Commonly known as:  LASIX  Take 80 mg by mouth daily.     gabapentin 100 MG capsule  Commonly known as:  NEURONTIN  Take 100 mg by mouth 2 (two) times daily.     hydrALAZINE 50 MG tablet  Commonly known as:  APRESOLINE  Take 1.5 tablets (75 mg total) by mouth 3 (three) times daily.     isosorbide mononitrate 30 MG 24 hr tablet  Commonly known as:  IMDUR  Take 30 mg by mouth daily.     nitroGLYCERIN 0.4 MG SL tablet  Commonly known as:  NITROSTAT  Place 1 tablet (0.4 mg total) under the tongue every 5 (five) minutes as needed for chest pain (upto max 3 doses at one time.).     pantoprazole 40 MG tablet  Commonly known as:  PROTONIX  Take 40 mg by mouth daily.     phenytoin 100 MG ER capsule  Commonly known as:  DILANTIN  Take 200-300 mg by mouth 2 (two) times daily. 200mg  in the morning, 300mg  in the evening     pravastatin 40 MG tablet  Commonly known as:  PRAVACHOL  Take 1 tablet (40 mg total) by mouth daily at 6 PM.          Diet and Activity recommendation: See Discharge Instructions above   Consults obtained -  Cards   Major procedures and Radiology Reports - PLEASE review detailed and final reports for all details, in brief -       Dg Chest 2 View  04/26/2014   CLINICAL DATA:  Left-sided chest pain and shortness of breath since this morning. Bilateral leg swelling 3 days.  EXAM: CHEST  2 VIEW  COMPARISON:  03/01/2014 and 01/01/2014  FINDINGS: Sternotomy wires unchanged. Lungs are adequately inflated without  focal consolidation. Mild prominence of the perihilar markings unchanged likely mild degree of vascular congestion. Possible small amount left pleural fluid. Moderate stable cardiomegaly. Remainder of the exam is unchanged.  IMPRESSION: Stable cardiomegaly with suggestion of minimal vascular congestion. Possible small amount left pleural fluid.   Electronically Signed   By: Marin Olp M.D.   On: 04/26/2014 09:45   Ct Angio Chest Aorta W/cm &/or Wo/cm  04/26/2014   CLINICAL DATA:  Left-sided chest and. Rule out dissection. Initial encounter. Left leg swelling. Abdominal bloating. Diabetes. Coronary artery disease.  EXAM: CT ANGIOGRAPHY CHEST, ABDOMEN AND PELVIS  TECHNIQUE: Multidetector CT imaging through the chest, abdomen and pelvis was performed using the standard protocol during bolus administration of intravenous contrast. Multiplanar reconstructed images and MIPs were obtained and reviewed to evaluate the vascular anatomy.  CONTRAST:  168mL OMNIPAQUE IOHEXOL 350 MG/ML SOLN  COMPARISON:  Chest radiograph of 04/26/2014. Chest CT 10/23/2013. Abdominal pelvic CT of 07/16/2013.  FINDINGS: CT CHEST FINDINGS  Mediastinum/Nodes: Mild bilateral  gynecomastia.  Normal caliber the great vessels, with mild atherosclerosis within. Prior median sternotomy for CABG. No evidence of thoracic aortic dissection. Ascending aorta measures 4.4 cm at the sinuses of Valsalva, 3.2 cm at the sino-tubular junction, and 3.8 cm at the mid ascending segment. Felt to be similar.  Moderate cardiomegaly, without pericardial effusion. Pulmonary artery is enlarged, with the outflow tract measuring 4.1 cm. No pulmonary embolism identified.  Mild thoracic adenopathy. Example 1.6 cm precarinal node, unchanged. This is unchanged since the prior exam. No hilar adenopathy.  Lungs/Pleura: Mild motion degradation. Poor inspiratory effort. Mild mosaic attenuation which could relate to air trapping or mild pulmonary venous congestion.  Small left  greater than right pleural effusions, increased since 10/23/2013.  Musculoskeletal: No acute osseous abnormality.  CT ABDOMEN PELVIS FINDINGS  Hepatobiliary: Mildly irregular hepatic capsule, suggesting cirrhosis. Normal gallbladder and biliary tract.  Motion degradation continues into the abdomen.  Pancreas: No pancreatic duct dilatation or focal mass. There is interstitial thickening in the peripancreatic space, including on image 143.  Spleen: Normal  Adrenals/Urinary Tract: Normal adrenal glands. Interpolar left renal lesion measures 1.4 cm and greater than fluid density on image 138. This was present back to 03/24/2010, and similar in size on 07/16/2013. Normal right kidney, without hydronephrosis.  Bladder wall is thickened.  High density material within.  Stomach/Bowel: Proximal gastric underdistention. Normal colon, appendix, and terminal ileum. Normal small bowel.  Vascular/Lymphatic: Abdominal aorta is normal in caliber, with atherosclerosis within. Atherosclerotic irregularity at the origin of both renal arteries, without hemodynamically significant stenosis. The inferior mesenteric artery is patent.  The left internal iliac is significantly narrowed at its origin. No abdominopelvic adenopathy.  Reproductive: Normal prostate.  Other: Small fat containing left inguinal hernia. Small volume abdominal pelvic ascites.  Diffuse anasarca  Musculoskeletal: Degenerative disc disease at the L4-5 level. Multiple lumbosacral disc bulges. S-shaped thoracolumbar spine curvature.  Review of the MIP images confirms the above findings.  IMPRESSION: CT CHEST IMPRESSION  1. Mild motion degradation. 2. No evidence of thoracic aortic dissection. Mild ascending aortic prominence, as detailed above. Recommend annual imaging followup by CTA or MRA. This recommendation follows 2010 ACCF/AHA/AATS/ACR/ASA/SCA/SCAI/SIR/STS/SVM Guidelines for the Diagnosis and Management of Patients With Thoracic Aortic Disease. Circulation. 2010;  121: H474-Q595 3. Probable congestive heart failure, with bilateral pleural effusions. 4. Pulmonary artery enlargement suggests pulmonary arterial hypertension. 5. Similar mediastinal adenopathy. This could be related to congestive heart failure. Recommend attention on follow-up.  CT ABDOMEN AND PELVIS IMPRESSION  1. Mild motion degradation. 2. Advanced aortic atherosclerosis, without dissection or aneurysm. 3. Abdominal pelvic ascites.  Suspect mild cirrhosis. 4. Peripancreatic interstitial thickening. Cannot exclude pancreatitis. This appearance is nonspecific in the setting of ascites. 5. Pericystic edema. This could represent cystitis. Increased density within the urinary bladder could represent Early contrast excretion or hemorrhage. Consider correlation with urinalysis.   Electronically Signed   By: Abigail Miyamoto M.D.   On: 04/26/2014 11:44   Ct Cta Abd/pel W/cm &/or W/o Cm  04/26/2014   CLINICAL DATA:  Left-sided chest and. Rule out dissection. Initial encounter. Left leg swelling. Abdominal bloating. Diabetes. Coronary artery disease.  EXAM: CT ANGIOGRAPHY CHEST, ABDOMEN AND PELVIS  TECHNIQUE: Multidetector CT imaging through the chest, abdomen and pelvis was performed using the standard protocol during bolus administration of intravenous contrast. Multiplanar reconstructed images and MIPs were obtained and reviewed to evaluate the vascular anatomy.  CONTRAST:  158mL OMNIPAQUE IOHEXOL 350 MG/ML SOLN  COMPARISON:  Chest radiograph of 04/26/2014. Chest CT 10/23/2013.  Abdominal pelvic CT of 07/16/2013.  FINDINGS: CT CHEST FINDINGS  Mediastinum/Nodes: Mild bilateral gynecomastia.  Normal caliber the great vessels, with mild atherosclerosis within. Prior median sternotomy for CABG. No evidence of thoracic aortic dissection. Ascending aorta measures 4.4 cm at the sinuses of Valsalva, 3.2 cm at the sino-tubular junction, and 3.8 cm at the mid ascending segment. Felt to be similar.  Moderate cardiomegaly, without  pericardial effusion. Pulmonary artery is enlarged, with the outflow tract measuring 4.1 cm. No pulmonary embolism identified.  Mild thoracic adenopathy. Example 1.6 cm precarinal node, unchanged. This is unchanged since the prior exam. No hilar adenopathy.  Lungs/Pleura: Mild motion degradation. Poor inspiratory effort. Mild mosaic attenuation which could relate to air trapping or mild pulmonary venous congestion.  Small left greater than right pleural effusions, increased since 10/23/2013.  Musculoskeletal: No acute osseous abnormality.  CT ABDOMEN PELVIS FINDINGS  Hepatobiliary: Mildly irregular hepatic capsule, suggesting cirrhosis. Normal gallbladder and biliary tract.  Motion degradation continues into the abdomen.  Pancreas: No pancreatic duct dilatation or focal mass. There is interstitial thickening in the peripancreatic space, including on image 143.  Spleen: Normal  Adrenals/Urinary Tract: Normal adrenal glands. Interpolar left renal lesion measures 1.4 cm and greater than fluid density on image 138. This was present back to 03/24/2010, and similar in size on 07/16/2013. Normal right kidney, without hydronephrosis.  Bladder wall is thickened.  High density material within.  Stomach/Bowel: Proximal gastric underdistention. Normal colon, appendix, and terminal ileum. Normal small bowel.  Vascular/Lymphatic: Abdominal aorta is normal in caliber, with atherosclerosis within. Atherosclerotic irregularity at the origin of both renal arteries, without hemodynamically significant stenosis. The inferior mesenteric artery is patent.  The left internal iliac is significantly narrowed at its origin. No abdominopelvic adenopathy.  Reproductive: Normal prostate.  Other: Small fat containing left inguinal hernia. Small volume abdominal pelvic ascites.  Diffuse anasarca  Musculoskeletal: Degenerative disc disease at the L4-5 level. Multiple lumbosacral disc bulges. S-shaped thoracolumbar spine curvature.  Review of the  MIP images confirms the above findings.  IMPRESSION: CT CHEST IMPRESSION  1. Mild motion degradation. 2. No evidence of thoracic aortic dissection. Mild ascending aortic prominence, as detailed above. Recommend annual imaging followup by CTA or MRA. This recommendation follows 2010 ACCF/AHA/AATS/ACR/ASA/SCA/SCAI/SIR/STS/SVM Guidelines for the Diagnosis and Management of Patients With Thoracic Aortic Disease. Circulation. 2010; 121: [id number] 3. Probable congestive heart failure, with bilateral pleural effusions. 4. Pulmonary artery enlargement suggests pulmonary arterial hypertension. 5. Similar mediastinal adenopathy. This could be related to congestive heart failure. Recommend attention on follow-up.  CT ABDOMEN AND PELVIS IMPRESSION  1. Mild motion degradation. 2. Advanced aortic atherosclerosis, without dissection or aneurysm. 3. Abdominal pelvic ascites.  Suspect mild cirrhosis. 4. Peripancreatic interstitial thickening. Cannot exclude pancreatitis. This appearance is nonspecific in the setting of ascites. 5. Pericystic edema. This could represent cystitis. Increased density within the urinary bladder could represent Early contrast excretion or hemorrhage. Consider correlation with urinalysis.   Electronically Signed   By: Abigail Miyamoto M.D.   On: 04/26/2014 11:44    Micro Results      Recent Results (from the past 240 hour(s))  MRSA PCR Screening     Status: Abnormal   Collection Time: 04/27/14 11:51 AM  Result Value Ref Range Status   MRSA by PCR POSITIVE (A) NEGATIVE Final    Comment:        The GeneXpert MRSA Assay (FDA approved for NASAL specimens only), is one component of a comprehensive MRSA colonization surveillance program. It is not  intended to diagnose MRSA infection nor to guide or monitor treatment for MRSA infections. RESULT CALLED TO, READ BACK BY AND VERIFIED WITH: A. [facility] RN 14:50 04/27/14 ([email or web address])        Today   Subjective:   Verne Carrow today has no  headache,no chest abdominal pain,no new weakness tingling or numbness, feels much better wants to go home today.    Objective:   Blood pressure 152/71, pulse 71, temperature 98 F (36.7 C), temperature source Oral, resp. rate 18, height 5\' 5"  (1.651 m), weight 77.338 kg (170 lb 8 oz), SpO2 97 %.   Intake/Output Summary (Last 24 hours) at 04/29/14 1230 Last data filed at 04/29/14 1000  Gross per 24 hour  Intake    692 ml  Output   3700 ml  Net  -3008 ml    Exam Awake Alert, Oriented x 3, No new F.N deficits, Normal affect [place].AT,PERRAL Supple Neck,No JVD, No cervical lymphadenopathy appriciated.  Symmetrical Chest wall movement, Good air movement bilaterally, CTAB RRR,No Gallops,Rubs or new Murmurs, No Parasternal Heave +ve B.Sounds, Abd Soft, Non tender, No organomegaly appriciated, No rebound -guarding or rigidity. No Cyanosis, Clubbing or edema, No new Rash or bruise  Data Review   CBC w Diff:  Lab Results  Component Value Date   WBC 5.4 04/26/2014   HGB 8.7* 04/26/2014   HCT 28.6* 04/26/2014   PLT 310 04/26/2014   LYMPHOPCT 13 01/01/2014   MONOPCT 9 01/01/2014   EOSPCT 5 01/01/2014   BASOPCT 1 01/01/2014    CMP:  Lab Results  Component Value Date   NA 135 04/29/2014   K 4.3 04/29/2014   CL 101 04/29/2014   CO2 23 04/29/2014   BUN 18 04/29/2014   CREATININE 1.27 04/29/2014   CREATININE 1.32 05/11/2010   PROT 7.2 04/26/2014   ALBUMIN 3.0* 04/26/2014   BILITOT 1.2 04/26/2014   ALKPHOS 87 04/26/2014   AST 42* 04/26/2014   ALT 30 04/26/2014  .   Total Time in preparing paper work, data evaluation and todays exam - 35 minutes  Thurnell Lose M.D on 04/29/2014 at 12:30 PM  [facility] Office  [phone]

## 2014-04-29 NOTE — Progress Notes (Signed)
Patient Profile: 69 year old male with history of coronary artery disease status post coronary artery bypass grafting in 2009, chronic combined systolic and diastolic heart failure, stage III kidney disease, GI bleeding, hyperlipidemia, obstructive sleep apnea not using CPAP, ischemic cardiomyopathy EF of 40-45%(last echo 11/27/2013), moderate severe pulmonary hypertension, polysubstance abuse, peripheral vascular disease, seizure disorder, diabetes. His last nuclear stress test was September 2015 there was no reversible ischemia with a fixed large inferior wall defect. EF estimated at 35%.   Admitted for CP. Troponins minimally elevated x 2 at 0.25 and 0.04. Repeat 3d troponin negative. UDS positive for cocaine.   Subjective: Denies SOB or chest pain. Complains of pain in both legs all over.   Objective: Vital signs in last 24 hours: Temp:  [97.7 F (36.5 C)-98 F (36.7 C)] 98 F (36.7 C) (02/10 0617) Pulse Rate:  [68-71] 71 (02/10 0617) Resp:  [17-18] 18 (02/10 0617) BP: (136-152)/(71-77) 152/71 mmHg (02/10 0617) SpO2:  [97 %-99 %] 97 % (02/10 0617) Weight:  [170 lb 8 oz (77.338 kg)] 170 lb 8 oz (77.338 kg) (02/10 0627) Last BM Date: 04/27/14  Intake/Output from previous day: 02/09 0701 - 02/10 0700 In: 1175 [P.O.:1172; I.V.:3] Out: 3650 [Urine:3650] Intake/Output this shift: Total I/O In: 0  Out: 600 [Urine:600]  Medications Current Facility-Administered Medications  Medication Dose Route Frequency Provider Last Rate Last Dose  . 0.9 %  sodium chloride infusion  250 mL Intravenous PRN Jonetta Osgood, MD      . acetaminophen (TYLENOL) tablet 650 mg  650 mg Oral Q4H PRN Shanker Kristeen Mans, MD      . albuterol (PROVENTIL) (2.5 MG/3ML) 0.083% nebulizer solution 2.5 mg  2.5 mg Nebulization Q2H PRN Jonetta Osgood, MD      . amLODipine (NORVASC) tablet 10 mg  10 mg Oral Daily Jonetta Osgood, MD   10 mg at 04/28/14 1024  . aspirin EC tablet 81 mg  81 mg Oral Daily Jonetta Osgood, MD   81 mg at 04/28/14 1024  . Chlorhexidine Gluconate Cloth 2 % PADS 6 each  6 each Topical Q0600 Donne Hazel, MD   6 each at 04/29/14 0600  . citalopram (CELEXA) tablet 20 mg  20 mg Oral Daily Jonetta Osgood, MD   20 mg at 04/28/14 1023  . cloNIDine (CATAPRES) tablet 0.1 mg  0.1 mg Oral BID Jonetta Osgood, MD   0.1 mg at 04/28/14 2121  . ferrous sulfate tablet 325 mg  325 mg Oral BID WC Jonetta Osgood, MD   325 mg at 04/28/14 1734  . furosemide (LASIX) injection 40 mg  40 mg Intravenous BID Jonetta Osgood, MD   40 mg at 04/28/14 1735  . heparin injection 5,000 Units  5,000 Units Subcutaneous 3 times per day Jonetta Osgood, MD   5,000 Units at 04/26/14 2216  . hydrALAZINE (APRESOLINE) tablet 75 mg  75 mg Oral TID Jonetta Osgood, MD   75 mg at 04/28/14 2121  . isosorbide mononitrate (IMDUR) 24 hr tablet 30 mg  30 mg Oral Daily Jonetta Osgood, MD   30 mg at 04/28/14 1021  . morphine 2 MG/ML injection 1 mg  1 mg Intravenous Q4H PRN Jonetta Osgood, MD   1 mg at 04/28/14 1024  . mupirocin ointment (BACTROBAN) 2 % 1 application  1 application Nasal BID Donne Hazel, MD   1 application at 43/15/40 2122  . nitroGLYCERIN (NITROSTAT) SL tablet 0.4 mg  0.4 mg Sublingual Q5 min PRN Jonetta Osgood, MD      . ondansetron [facility]) injection 4 mg  4 mg Intravenous Q6H PRN Shanker Kristeen Mans, MD      . oxyCODONE (Oxy IR/ROXICODONE) immediate release tablet 5 mg  5 mg Oral Q6H PRN Jonetta Osgood, MD   5 mg at 04/27/14 2156  . pantoprazole (PROTONIX) EC tablet 40 mg  40 mg Oral Daily Jonetta Osgood, MD   40 mg at 04/28/14 1023  . phenytoin (DILANTIN) ER capsule 200 mg  200 mg Oral Daily Jonetta Osgood, MD   200 mg at 04/28/14 1022  . phenytoin (DILANTIN) ER capsule 300 mg  300 mg Oral QHS Jonetta Osgood, MD   300 mg at 04/28/14 2121  . potassium chloride SA (K-DUR,KLOR-CON) CR tablet 20 mEq  20 mEq Oral BID Jonetta Osgood, MD   20 mEq at 04/28/14 2121  .  pravastatin (PRAVACHOL) tablet 40 mg  40 mg Oral q1800 Jonetta Osgood, MD   40 mg at 04/28/14 1733  . sodium chloride 0.9 % injection 3 mL  3 mL Intravenous Q12H Jonetta Osgood, MD   3 mL at 04/28/14 1025  . sodium chloride 0.9 % injection 3 mL  3 mL Intravenous PRN Shanker Kristeen Mans, MD        PE: General appearance: alert, cooperative and no distress Neck: no carotid bruit and no JVD Lungs: clear to auscultation bilaterally Heart: regular rate and rhythm, S1, S2 normal, no murmur, click, rub or gallop Extremities: 1+bilateral edema Pulses: 2+ and symmetric Skin: warm and dry Neurologic: Grossly normal  Lab Results:  No results for input(s): WBC, HGB, HCT, PLT in the last 72 hours. BMET  Recent Labs  04/27/14 0635 04/28/14 0522 04/29/14 0520  NA 136 137 135  K 4.1 4.7 4.3  CL 105 105 101  CO2 23 22 23   GLUCOSE 97 75 96  BUN 16 20 18   CREATININE 1.19 1.35 1.27  CALCIUM 8.5 8.8 8.8   Cardiac Panel (last 3 results)  Recent Labs  04/26/14 1842 04/26/14 2353 04/27/14 0635  TROPONINI 0.25* 0.04* 0.03    Assessment/Plan  Principal Problem:   Acute diastolic CHF (congestive heart failure) Active Problems:   CAD- CABG x3 '09. Myoview no ischemia 11/27/13   Moderate to severe pulmonary hypertension   Polysubstance abuse   Seizure disorder   CHF exacerbation   1. Chest Pain: atypical characteristics. Pt notes sharp pain. He had a low risk Myoview 11/2013. Initial troponins mildly elevated but f/u troponin returned to normal. Patient without further chest pain.  2. Elevated Troponin: minimally elevated x 2 at 0.25 and 0.04. 3rd troponin returned normal. Mild cardiac enzyme abnormality likely reflects CHF rather than a new coronary event. Cocaine abuse likely contributory as well.   3. Acute on Chronic Systolic CHF: last known EF 40-45% 11/2013. BNP elevated on admit at 2313. Continue with IV Lasix.  He he is diuresing well.- 4.2L in urine output since admit. Weight  down 8 lbs.  Renal function stable.   Avoid "pure' beta blockers given cocaine use. Can continue nitrates, vasodilators and clonidine. I would continue IV lasix one more day then transition to po.    LOS: 3 days      Peter Martinique, [place] 04/29/2014 10:51 AM

## 2014-04-30 ENCOUNTER — Encounter: Payer: Self-pay | Admitting: Nurse Practitioner

## 2014-04-30 MED ORDER — ALBUTEROL SULFATE HFA 108 (90 BASE) MCG/ACT IN AERS: 2.0000 | INHALATION_SPRAY | Freq: Four times a day (QID) | RESPIRATORY_TRACT | Status: AC | PRN

## 2014-04-30 MED ORDER — FUROSEMIDE 10 MG/ML IJ SOLN
80.0000 mg | Freq: Once | INTRAMUSCULAR | Status: AC
  Administered 2014-04-30: 40 mg via INTRAVENOUS

## 2014-04-30 NOTE — Progress Notes (Signed)
Patient was discharged yesterday, refused to go as he said he had some previously arranged meetings for today and he would like to be discharged today. This morning he said he wants to leave as soon as possible as the time for meeting has come. He did not want to leave yesterday unnecessarily as he had nothing to do. He feels fine. We will get his IV diuretic. We will give him a TED hose stocking for home. Counseled him on quitting cocaine, smoking and alcohol, fluid and salt restriction. He has long history of noncompliance.

## 2014-04-30 NOTE — Progress Notes (Signed)
Patient taken out for discharge via wheelchair. Cab voucher received and patient being transported to home address.

## 2014-04-30 NOTE — Progress Notes (Signed)
Patient Profile: 69 year old male with history of CAD s/p CABG  in 2009, chronic combined systolic and diastolic heart failure, stage III kidney disease, GI bleeding, hyperlipidemia, obstructive sleep apnea not using CPAP, ischemic cardiomyopathy EF of 40-45%(last echo 11/27/2013), moderate severe pulmonary hypertension, polysubstance abuse, peripheral vascular disease, seizure disorder, diabetes. His last nuclear stress test was September 2015 there was no reversible ischemia with a fixed large inferior wall defect. EF estimated at 35%.   Admitted for CP. Troponins minimally elevated x 2 at 0.25 and 0.04. Repeat 3d troponin negative. UDS positive for cocaine.   Subjective: Denies SOB or chest pain. Complains of pain in both legs all over. Wants to go home today. He said the IM DR said he was going home today. He is followed by Dr. Wyline Copas in [place]  Objective: Vital signs in last 24 hours: Temp:  [97.6 F (36.4 C)-98.2 F (36.8 C)] 97.6 F (36.4 C) (02/11 0421) Pulse Rate:  [67-68] 68 (02/11 0421) Resp:  [18] 18 (02/11 0421) BP: (120-150)/(46-75) 150/75 mmHg (02/11 0421) SpO2:  [97 %-98 %] 98 % (02/11 0421) Weight:  [167 lb (75.751 kg)] 167 lb (75.751 kg) (02/11 0421) Last BM Date: 04/29/14  Intake/Output from previous day: 02/10 0701 - 02/11 0700 In: 1260 [P.O.:1260] Out: 3425 [Urine:3425] Intake/Output this shift: Total I/O In: 360 [P.O.:360] Out: 600 [Urine:600]  Medications Current Facility-Administered Medications  Medication Dose Route Frequency Provider Last Rate Last Dose  . 0.9 %  sodium chloride infusion  250 mL Intravenous PRN Jonetta Osgood, MD      . acetaminophen (TYLENOL) tablet 650 mg  650 mg Oral Q4H PRN Shanker Kristeen Mans, MD      . albuterol (PROVENTIL) (2.5 MG/3ML) 0.083% nebulizer solution 2.5 mg  2.5 mg Nebulization Q2H PRN Jonetta Osgood, MD      . amLODipine (NORVASC) tablet 10 mg  10 mg Oral Daily Jonetta Osgood, MD   10 mg at 04/29/14 1103  .  aspirin EC tablet 81 mg  81 mg Oral Daily Jonetta Osgood, MD   81 mg at 04/29/14 1103  . Chlorhexidine Gluconate Cloth 2 % PADS 6 each  6 each Topical Q0600 Donne Hazel, MD   6 each at 04/29/14 0600  . citalopram (CELEXA) tablet 20 mg  20 mg Oral Daily Jonetta Osgood, MD   20 mg at 04/29/14 1105  . cloNIDine (CATAPRES) tablet 0.1 mg  0.1 mg Oral BID Jonetta Osgood, MD   0.1 mg at 04/29/14 2144  . ferrous sulfate tablet 325 mg  325 mg Oral BID WC Jonetta Osgood, MD   325 mg at 04/30/14 0744  . furosemide (LASIX) injection 80 mg  80 mg Intravenous Once Thurnell Lose, MD      . heparin injection 5,000 Units  5,000 Units Subcutaneous 3 times per day Jonetta Osgood, MD   5,000 Units at 04/26/14 2216  . hydrALAZINE (APRESOLINE) tablet 75 mg  75 mg Oral TID Jonetta Osgood, MD   75 mg at 04/29/14 2144  . isosorbide mononitrate (IMDUR) 24 hr tablet 30 mg  30 mg Oral Daily Jonetta Osgood, MD   30 mg at 04/29/14 1104  . morphine 2 MG/ML injection 1 mg  1 mg Intravenous Q4H PRN Jonetta Osgood, MD   1 mg at 04/30/14 0331  . mupirocin ointment (BACTROBAN) 2 % 1 application  1 application Nasal BID Donne Hazel, MD   1 application at 44/31/54  1106  . nitroGLYCERIN (NITROSTAT) SL tablet 0.4 mg  0.4 mg Sublingual Q5 min PRN Jonetta Osgood, MD      . ondansetron [facility]) injection 4 mg  4 mg Intravenous Q6H PRN Shanker Kristeen Mans, MD      . oxyCODONE (Oxy IR/ROXICODONE) immediate release tablet 5 mg  5 mg Oral Q6H PRN Jonetta Osgood, MD   5 mg at 04/27/14 2156  . pantoprazole (PROTONIX) EC tablet 40 mg  40 mg Oral Daily Jonetta Osgood, MD   40 mg at 04/29/14 1120  . phenytoin (DILANTIN) ER capsule 200 mg  200 mg Oral Daily Jonetta Osgood, MD   200 mg at 04/29/14 1103  . phenytoin (DILANTIN) ER capsule 300 mg  300 mg Oral QHS Jonetta Osgood, MD   300 mg at 04/29/14 2144  . potassium chloride SA (K-DUR,KLOR-CON) CR tablet 20 mEq  20 mEq Oral BID Jonetta Osgood, MD   20  mEq at 04/29/14 2144  . pravastatin (PRAVACHOL) tablet 40 mg  40 mg Oral q1800 Jonetta Osgood, MD   40 mg at 04/29/14 1811  . sodium chloride 0.9 % injection 3 mL  3 mL Intravenous Q12H Jonetta Osgood, MD   3 mL at 04/29/14 2145  . sodium chloride 0.9 % injection 3 mL  3 mL Intravenous PRN Shanker Kristeen Mans, MD        PE: General appearance: alert, cooperative and no distress Neck: no carotid bruit and no JVD Lungs: clear to auscultation bilaterally Heart: regular rate and rhythm, S1, S2 normal, no murmur, click, rub or gallop Extremities: 1+bilateral edema Pulses: 2+ and symmetric Skin: warm and dry Neurologic: Grossly normal  Lab Results:  No results for input(s): WBC, HGB, HCT, PLT in the last 72 hours. BMET  Recent Labs  04/28/14 0522 04/29/14 0520  NA 137 135  K 4.7 4.3  CL 105 101  CO2 22 23  GLUCOSE 75 96  BUN 20 18  CREATININE 1.35 1.27  CALCIUM 8.8 8.8   Cardiac Panel (last 3 results) No results for input(s): CKTOTAL, CKMB, TROPONINI, RELINDX in the last 72 hours.  Assessment/Plan  Principal Problem:   Acute diastolic CHF (congestive heart failure) Active Problems:   CAD- CABG x3 '09. Myoview no ischemia 11/27/13   Moderate to severe pulmonary hypertension   Polysubstance abuse   Seizure disorder   CHF exacerbation   1. Chest Pain: atypical characteristics. Pt notes sharp pain. He had a low risk Myoview 11/2013. Initial troponins mildly elevated but f/u troponin returned to normal. Patient without further chest pain.  2. Elevated Troponin: minimally elevated x 2 at 0.25 and 0.04. 3rd troponin returned normal. Mild cardiac enzyme abnormality likely reflects CHF rather than a new coronary event. Cocaine abuse likely contributory as well.   3. Acute on Chronic Systolic CHF: last known EF 40-45% 11/2013. BNP elevated on admit at 2313. Continue with IV Lasix.  He he is diuresing well.- 6.1 L in urine output since admit. Weight down 13 lbs.  Renal function  stable.   Avoid "pure' beta blockers given cocaine use. Can continue nitrates, vasodilators and clonidine. -- Will convert to PO 80mg  Lasix today. He says he is being discharged today. He is followed by Dr. Wyline Copas in [place] and will follow up with him in the next 1-2 weeks.    LOS: 4 days   Angelena Form PA-C    Patient seen and examined and history reviewed. Agree with  above findings and plan. Feeling well. Excellent response to diuretics. Ready for DC from our standpoint. Patient will follow up with his physician in [facility].   Aryiana Klinkner Martinique, [place] 04/30/2014 11:26 AM

## 2014-04-30 NOTE — Progress Notes (Signed)
Home discharge instructions discussed with patient. Discussed diet, follow up appts, medications and activity. Patient refusing to put on TED hose at this time. Patient states "I know how to do it, I will do it when I get home." Patient made aware that prescriptions sent to patients pharmacy of choice. Patient verbally understands discharge instructions.

## 2014-04-30 NOTE — Evaluation (Signed)
Physical Therapy Evaluation Patient Details Name: Charles [name] MRN: [id number] DOB: 24-Feb-1946 Today's Date: 04/30/2014   History of Present Illness  Charles [name] is a 69 y.o. male with a Past Medical History of chronic combined diastolic/systolic heart failure, ischemic cardiomyopathy, dyslipidemia, ongoing cocaine use who presents today with SOB. Pt admitted for acute diastolic CHF.  Clinical Impression  Pt is mod I with bed mobility and transfers. Supervision provided for ambulation 300 feet for safety only.  Pt requesting cane for home use.  He reports multi falls at home.  No LOB or instability noted on eval. PT signing off.    Follow Up Recommendations No PT follow up;Supervision - Intermittent    Equipment Recommendations  Cane    Recommendations for Other Services       Precautions / Restrictions Precautions Precautions: None Restrictions Weight Bearing Restrictions: No      Mobility  Bed Mobility Overal bed mobility: Modified Independent                Transfers Overall transfer level: Modified independent Equipment used: None                Ambulation/Gait Ambulation/Gait assistance: Supervision Ambulation Distance (Feet): 300 Feet Assistive device: None Gait Pattern/deviations: Step-through pattern;Decreased stride length Gait velocity: decreased   General Gait Details: O2 sats 99% prior to ambulation and 93% after ambulation. Pt recovered to 97% after 1 minute seated rest.  Stairs            Wheelchair Mobility    Modified Rankin (Stroke Patients Only)       Balance Overall balance assessment: History of Falls                                           Pertinent Vitals/Pain Pain Assessment: No/denies pain    Home Living Family/patient expects to be discharged to:: Private residence Living Arrangements: Children Available Help at Discharge: Family;Available 24 hours/day Type of Home:  House Home Access: Stairs to enter Entrance Stairs-Rails: Right;Left;Can reach both Entrance Stairs-Number of Steps: 6 Home Layout: One level Home Equipment: None      Prior Function Level of Independence: Independent               Hand Dominance   Dominant Hand: Right    Extremity/Trunk Assessment   Upper Extremity Assessment: Overall WFL for tasks assessed           Lower Extremity Assessment: Overall WFL for tasks assessed         Communication   Communication: No difficulties  Cognition Arousal/Alertness: Awake/alert Behavior During Therapy: WFL for tasks assessed/performed Overall Cognitive Status: Within Functional Limits for tasks assessed                      General Comments      Exercises        Assessment/Plan    PT Assessment Patent does not need any further PT services  PT Diagnosis Difficulty walking   PT Problem List    PT Treatment Interventions     PT Goals (Current goals can be found in the Care Plan section) Acute Rehab PT Goals Patient Stated Goal: home today; cane for home use PT Goal Formulation: All assessment and education complete, DC therapy    Frequency     Barriers to discharge  Co-evaluation               End of Session Equipment Utilized During Treatment: Gait belt Activity Tolerance: Patient tolerated treatment well Patient left: in bed;with call bell/phone within reach Nurse Communication: Mobility status         Time: 0156-1537 PT Time Calculation (min) (ACUTE ONLY): 14 min   Charges:   PT Evaluation $Initial PT Evaluation Tier I: 1 Procedure     PT G Codes:        Charles [name] 04/30/2014, 9:19 AM

## 2014-04-30 NOTE — Progress Notes (Signed)
Telemetry discontinued. CCMD notified.  

## 2014-05-01 NOTE — Care Management Utilization Note (Signed)
[employer] notified us late yesterday that they were in support of the DC written on the 10th. Patient liability would have been today at noon. Patient did leave yesterday the 11th.

## 2014-05-05 ENCOUNTER — Encounter: Payer: Medicare Other | Admitting: Cardiology

## 2014-05-07 ENCOUNTER — Ambulatory Visit: Payer: Medicare Other | Admitting: Nurse Practitioner

## 2014-05-07 ENCOUNTER — Telehealth: Payer: Self-pay | Admitting: Nurse Practitioner

## 2014-05-07 NOTE — Telephone Encounter (Signed)
2/18 patient called this morning to cancel his same day appointment.  He had confirmed on the auto reminder, but states he never set up transportation.

## 2014-05-20 ENCOUNTER — Inpatient Hospital Stay (HOSPITAL_COMMUNITY)
Admission: EM | Admit: 2014-05-20 | Discharge: 2014-05-25 | DRG: 292 | Disposition: A | Discharge status: Home / Self Care | Payer: Medicare Other | Attending: Internal Medicine | Admitting: Internal Medicine

## 2014-05-20 ENCOUNTER — Encounter (HOSPITAL_COMMUNITY): Payer: Self-pay | Admitting: *Deleted

## 2014-05-20 ENCOUNTER — Emergency Department (HOSPITAL_COMMUNITY): Payer: Medicare Other

## 2014-05-20 DIAGNOSIS — G4733 Obstructive sleep apnea (adult) (pediatric): Secondary | ICD-10-CM | POA: Diagnosis present

## 2014-05-20 DIAGNOSIS — F1910 Other psychoactive substance abuse, uncomplicated: Secondary | ICD-10-CM | POA: Diagnosis present

## 2014-05-20 DIAGNOSIS — Z8249 Family history of ischemic heart disease and other diseases of the circulatory system: Secondary | ICD-10-CM

## 2014-05-20 DIAGNOSIS — I509 Heart failure, unspecified: Secondary | ICD-10-CM | POA: Diagnosis not present

## 2014-05-20 DIAGNOSIS — I129 Hypertensive chronic kidney disease with stage 1 through stage 4 chronic kidney disease, or unspecified chronic kidney disease: Secondary | ICD-10-CM | POA: Diagnosis present

## 2014-05-20 DIAGNOSIS — Z951 Presence of aortocoronary bypass graft: Secondary | ICD-10-CM | POA: Diagnosis not present

## 2014-05-20 DIAGNOSIS — I255 Ischemic cardiomyopathy: Secondary | ICD-10-CM | POA: Diagnosis present

## 2014-05-20 DIAGNOSIS — D509 Iron deficiency anemia, unspecified: Secondary | ICD-10-CM | POA: Diagnosis present

## 2014-05-20 DIAGNOSIS — Z7982 Long term (current) use of aspirin: Secondary | ICD-10-CM

## 2014-05-20 DIAGNOSIS — F1410 Cocaine abuse, uncomplicated: Secondary | ICD-10-CM | POA: Diagnosis not present

## 2014-05-20 DIAGNOSIS — Z87891 Personal history of nicotine dependence: Secondary | ICD-10-CM | POA: Diagnosis not present

## 2014-05-20 DIAGNOSIS — I5043 Acute on chronic combined systolic (congestive) and diastolic (congestive) heart failure: Principal | ICD-10-CM | POA: Diagnosis present

## 2014-05-20 DIAGNOSIS — Z886 Allergy status to analgesic agent status: Secondary | ICD-10-CM

## 2014-05-20 DIAGNOSIS — I701 Atherosclerosis of renal artery: Secondary | ICD-10-CM | POA: Diagnosis present

## 2014-05-20 DIAGNOSIS — R0789 Other chest pain: Secondary | ICD-10-CM | POA: Diagnosis present

## 2014-05-20 DIAGNOSIS — I272 Other secondary pulmonary hypertension: Secondary | ICD-10-CM | POA: Diagnosis present

## 2014-05-20 DIAGNOSIS — E785 Hyperlipidemia, unspecified: Secondary | ICD-10-CM | POA: Diagnosis present

## 2014-05-20 DIAGNOSIS — G40909 Epilepsy, unspecified, not intractable, without status epilepticus: Secondary | ICD-10-CM | POA: Diagnosis present

## 2014-05-20 DIAGNOSIS — N183 Chronic kidney disease, stage 3 unspecified: Secondary | ICD-10-CM | POA: Diagnosis present

## 2014-05-20 DIAGNOSIS — E119 Type 2 diabetes mellitus without complications: Secondary | ICD-10-CM | POA: Diagnosis present

## 2014-05-20 DIAGNOSIS — E871 Hypo-osmolality and hyponatremia: Secondary | ICD-10-CM | POA: Diagnosis present

## 2014-05-20 DIAGNOSIS — D631 Anemia in chronic kidney disease: Secondary | ICD-10-CM | POA: Diagnosis present

## 2014-05-20 DIAGNOSIS — I248 Other forms of acute ischemic heart disease: Secondary | ICD-10-CM | POA: Diagnosis present

## 2014-05-20 DIAGNOSIS — M79604 Pain in right leg: Secondary | ICD-10-CM | POA: Diagnosis not present

## 2014-05-20 DIAGNOSIS — M79605 Pain in left leg: Secondary | ICD-10-CM

## 2014-05-20 DIAGNOSIS — Z888 Allergy status to other drugs, medicaments and biological substances status: Secondary | ICD-10-CM

## 2014-05-20 DIAGNOSIS — R079 Chest pain, unspecified: Secondary | ICD-10-CM | POA: Diagnosis present

## 2014-05-20 DIAGNOSIS — I2510 Atherosclerotic heart disease of native coronary artery without angina pectoris: Secondary | ICD-10-CM | POA: Diagnosis present

## 2014-05-20 DIAGNOSIS — I739 Peripheral vascular disease, unspecified: Secondary | ICD-10-CM | POA: Diagnosis present

## 2014-05-20 LAB — BRAIN NATRIURETIC PEPTIDE: B NATRIURETIC PEPTIDE 5: 2249.6 pg/mL — AB (ref 0.0–100.0)

## 2014-05-20 LAB — BASIC METABOLIC PANEL
Anion gap: 5 (ref 5–15)
BUN: 22 mg/dL (ref 6–23)
CHLORIDE: 100 mmol/L (ref 96–112)
CO2: 25 mmol/L (ref 19–32)
CREATININE: 1.3 mg/dL (ref 0.50–1.35)
Calcium: 8.6 mg/dL (ref 8.4–10.5)
GFR calc non Af Amer: 54 mL/min — ABNORMAL LOW (ref >90)
GFR, EST AFRICAN AMERICAN: 63 mL/min — AB (ref >90)
GLUCOSE: 79 mg/dL (ref 70–99)
Potassium: 4.2 mmol/L (ref 3.5–5.1)
Sodium: 130 mmol/L — ABNORMAL LOW (ref 135–145)

## 2014-05-20 LAB — RAPID URINE DRUG SCREEN, HOSP PERFORMED
AMPHETAMINES: NOT DETECTED
Barbiturates: NOT DETECTED
Benzodiazepines: NOT DETECTED
Cocaine: POSITIVE — AB
Opiates: POSITIVE — AB
Tetrahydrocannabinol: NOT DETECTED

## 2014-05-20 LAB — GLUCOSE, CAPILLARY
Glucose-Capillary: 172 mg/dL — ABNORMAL HIGH (ref 70–99)
Glucose-Capillary: 122 mg/dL — ABNORMAL HIGH (ref 70–99)

## 2014-05-20 LAB — CBC
HCT: 25 % — ABNORMAL LOW (ref 39.0–52.0)
HEMOGLOBIN: 7.6 g/dL — AB (ref 13.0–17.0)
MCH: 24.8 pg — AB (ref 26.0–34.0)
MCHC: 30.4 g/dL (ref 30.0–36.0)
MCV: 81.7 fL (ref 78.0–100.0)
PLATELETS: 318 10*3/uL (ref 150–400)
RBC: 3.06 MIL/uL — ABNORMAL LOW (ref 4.22–5.81)
RDW: 19.1 % — ABNORMAL HIGH (ref 11.5–15.5)
WBC: 4.5 10*3/uL (ref 4.0–10.5)

## 2014-05-20 LAB — I-STAT TROPONIN, ED: Troponin i, poc: 0.02 ng/mL (ref 0.00–0.08)

## 2014-05-20 LAB — TROPONIN I
TROPONIN I: 0.08 ng/mL — AB (ref <0.031)
TROPONIN I: 0.04 ng/mL — AB (ref <0.031)

## 2014-05-20 LAB — CK: Total CK: 67 U/L (ref 7–232)

## 2014-05-20 MED ORDER — CLONIDINE HCL 0.1 MG PO TABS
0.1000 mg | ORAL_TABLET | Freq: Two times a day (BID) | ORAL | Status: DC
Start: 2014-05-20 — End: 2014-05-25
  Administered 2014-05-20 – 2014-05-24 (×9): 0.1 mg via ORAL
  Filled 2014-05-20 (×11): qty 1

## 2014-05-20 MED ORDER — INSULIN ASPART 100 UNIT/ML ~~LOC~~ SOLN: 0.0000 [IU] | Freq: Three times a day (TID) | SUBCUTANEOUS | Status: DC

## 2014-05-20 MED ORDER — SODIUM CHLORIDE 0.9 % IJ SOLN
3.0000 mL | Freq: Two times a day (BID) | INTRAMUSCULAR | Status: DC
  Administered 2014-05-20 – 2014-05-24 (×8): 3 mL via INTRAVENOUS

## 2014-05-20 MED ORDER — FUROSEMIDE 10 MG/ML IJ SOLN
100.0000 mg | Freq: Once | INTRAMUSCULAR | Status: AC
  Administered 2014-05-20: 100 mg via INTRAVENOUS
  Filled 2014-05-20 (×2): qty 10

## 2014-05-20 MED ORDER — PHENYTOIN SODIUM EXTENDED 100 MG PO CAPS: 200.0000 mg | ORAL_CAPSULE | Freq: Two times a day (BID) | ORAL | Status: DC

## 2014-05-20 MED ORDER — ASPIRIN 81 MG PO CHEW
324.0000 mg | CHEWABLE_TABLET | Freq: Once | ORAL | Status: AC
  Administered 2014-05-20: 324 mg via ORAL
  Filled 2014-05-20: qty 4

## 2014-05-20 MED ORDER — ISOSORBIDE MONONITRATE ER 30 MG PO TB24
30.0000 mg | ORAL_TABLET | Freq: Every day | ORAL | Status: DC
  Administered 2014-05-20 – 2014-05-24 (×5): 30 mg via ORAL
  Filled 2014-05-20 (×6): qty 1

## 2014-05-20 MED ORDER — OXYCODONE HCL 5 MG PO TABS
5.0000 mg | ORAL_TABLET | ORAL | Status: DC | PRN
  Administered 2014-05-20: 5 mg via ORAL
  Filled 2014-05-20: qty 1

## 2014-05-20 MED ORDER — ASPIRIN EC 81 MG PO TBEC
81.0000 mg | DELAYED_RELEASE_TABLET | Freq: Every day | ORAL | Status: DC
  Administered 2014-05-21 – 2014-05-24 (×4): 81 mg via ORAL
  Filled 2014-05-20 (×5): qty 1

## 2014-05-20 MED ORDER — FUROSEMIDE 10 MG/ML IJ SOLN
40.0000 mg | Freq: Two times a day (BID) | INTRAMUSCULAR | Status: DC
  Administered 2014-05-21 – 2014-05-23 (×5): 40 mg via INTRAVENOUS
  Filled 2014-05-20 (×9): qty 4

## 2014-05-20 MED ORDER — FUROSEMIDE 10 MG/ML IJ SOLN: 40.0000 mg | Freq: Once | INTRAMUSCULAR | Status: DC

## 2014-05-20 MED ORDER — AMLODIPINE BESYLATE 10 MG PO TABS
10.0000 mg | ORAL_TABLET | Freq: Every day | ORAL | Status: DC
  Administered 2014-05-20 – 2014-05-24 (×5): 10 mg via ORAL
  Filled 2014-05-20 (×6): qty 1

## 2014-05-20 MED ORDER — SODIUM CHLORIDE 0.9 % IV SOLN: 250.0000 mL | INTRAVENOUS | Status: DC | PRN

## 2014-05-20 MED ORDER — OXYCODONE-ACETAMINOPHEN 5-325 MG PO TABS
2.0000 | ORAL_TABLET | Freq: Once | ORAL | Status: AC
  Administered 2014-05-21: 2 via ORAL
  Filled 2014-05-20: qty 2

## 2014-05-20 MED ORDER — GABAPENTIN 100 MG PO CAPS
100.0000 mg | ORAL_CAPSULE | Freq: Two times a day (BID) | ORAL | Status: DC
  Administered 2014-05-20 – 2014-05-24 (×9): 100 mg via ORAL
  Filled 2014-05-20 (×11): qty 1

## 2014-05-20 MED ORDER — SODIUM CHLORIDE 0.9 % IJ SOLN
3.0000 mL | INTRAMUSCULAR | Status: DC | PRN
  Administered 2014-05-21 – 2014-05-22 (×2): 3 mL via INTRAVENOUS
  Filled 2014-05-20 (×2): qty 3

## 2014-05-20 MED ORDER — OXYCODONE-ACETAMINOPHEN 5-325 MG PO TABS
1.0000 | ORAL_TABLET | ORAL | Status: DC | PRN
  Administered 2014-05-21 – 2014-05-24 (×6): 1 via ORAL
  Filled 2014-05-20 (×7): qty 1

## 2014-05-20 MED ORDER — CITALOPRAM HYDROBROMIDE 20 MG PO TABS
20.0000 mg | ORAL_TABLET | Freq: Every day | ORAL | Status: DC
  Administered 2014-05-20 – 2014-05-24 (×5): 20 mg via ORAL
  Filled 2014-05-20 (×6): qty 1

## 2014-05-20 MED ORDER — PRAVASTATIN SODIUM 40 MG PO TABS
40.0000 mg | ORAL_TABLET | Freq: Every day | ORAL | Status: DC
  Administered 2014-05-21 – 2014-05-22 (×3): 40 mg via ORAL
  Filled 2014-05-20 (×4): qty 1

## 2014-05-20 MED ORDER — HYDRALAZINE HCL 50 MG PO TABS
75.0000 mg | ORAL_TABLET | Freq: Three times a day (TID) | ORAL | Status: DC
  Administered 2014-05-20 – 2014-05-24 (×14): 75 mg via ORAL
  Filled 2014-05-20 (×17): qty 1

## 2014-05-20 MED ORDER — GI COCKTAIL ~~LOC~~
30.0000 mL | Freq: Once | ORAL | Status: AC
Start: 2014-05-20 — End: 2014-05-20
  Administered 2014-05-20: 30 mL via ORAL
  Filled 2014-05-20: qty 30

## 2014-05-20 MED ORDER — NITROGLYCERIN 0.4 MG SL SUBL
0.4000 mg | SUBLINGUAL_TABLET | SUBLINGUAL | Status: DC | PRN
  Administered 2014-05-20 (×2): 0.4 mg via SUBLINGUAL
  Filled 2014-05-20: qty 1

## 2014-05-20 MED ORDER — PANTOPRAZOLE SODIUM 40 MG PO TBEC
40.0000 mg | DELAYED_RELEASE_TABLET | Freq: Every day | ORAL | Status: DC
  Administered 2014-05-21 – 2014-05-22 (×3): 40 mg via ORAL
  Filled 2014-05-20 (×3): qty 1

## 2014-05-20 MED ORDER — LORAZEPAM 2 MG/ML IJ SOLN
1.0000 mg | Freq: Once | INTRAMUSCULAR | Status: AC
  Administered 2014-05-20: 1 mg via INTRAVENOUS
  Filled 2014-05-20: qty 1

## 2014-05-20 MED ORDER — PHENYTOIN SODIUM EXTENDED 100 MG PO CAPS
200.0000 mg | ORAL_CAPSULE | Freq: Every day | ORAL | Status: DC
  Administered 2014-05-20 – 2014-05-24 (×5): 200 mg via ORAL
  Filled 2014-05-20 (×6): qty 2

## 2014-05-20 MED ORDER — ALBUTEROL SULFATE (2.5 MG/3ML) 0.083% IN NEBU: 2.5000 mg | INHALATION_SOLUTION | Freq: Four times a day (QID) | RESPIRATORY_TRACT | Status: DC | PRN

## 2014-05-20 MED ORDER — PHENYTOIN SODIUM EXTENDED 100 MG PO CAPS
300.0000 mg | ORAL_CAPSULE | Freq: Every day | ORAL | Status: DC
  Administered 2014-05-21 – 2014-05-24 (×5): 300 mg via ORAL
  Filled 2014-05-20 (×6): qty 3

## 2014-05-20 NOTE — ED Notes (Addendum)
Patient with onset of cough and chest pain today.  He has significant cardiac hx.  He also is complaining of bil leg pain since Sunday.  Patient has swelling from mid leg to feet.  Patient is alert.  Sob at rest.  He is complaining of weakness as well.  Patient is tender to palpation as well.   Patient has not taken any meds today due to feeling so bad

## 2014-05-20 NOTE — Progress Notes (Signed)
PATIENT CURRENTLY ASLEEP WITHOUT INTERVENTION, AFTER C/O GENERALIZED PAIN RATED A 10. H.S. MEDS HELD AT THIS TIME. WILL ADMINISTER WHEN PATIENT IS AWAKE.

## 2014-05-20 NOTE — ED Notes (Addendum)
Patient aware we need a UA, he stated he not able to go at this time, I informed him we will need to do and in and out.He stated that he will try to use urinal in next few min. Urinal at bedside.

## 2014-05-20 NOTE — ED Notes (Signed)
Unsuccessful IV attempt x2.  

## 2014-05-20 NOTE — H&P (Signed)
History and Physical  Antoni Stefan MEQ:[id number] DOB: 1945/04/13 DOA: 05/20/2014  Referring physician: Dr. Evelina Bucy in ED PCP: [name]   Chief Complaint: chest pain  HPI:  69 year old male with history of combined congestive heart failure, ongoing cocaine use presented with three-day history of constant chest pain, increasing lower extremity edema and pain. Initial evaluation suggested acute on chronic heart failure. Treatment included aspirin, Lasix, GI cocktail, lorazepam and nitroglycerin.  Patient reports increasing swelling in his legs with associated pain. He reports constant left-sided chest pain present for the last 3 days without aggravating or alleviating factors. Described as being sharp in nature. Intense. Some associated shortness of breath. No nausea or vomiting. He does report using cocaine but says the last use was one week ago.  In the emergency department afebrile, vital signs stable. No hypoxia. Sodium 2:97, basic metabolic panel otherwise unremarkable. BNP 2249. Troponin point of care unremarkable. Chest x-ray independently reviewed showed pulmonary vascular congestion, CHF. EKG sinus rhythm, nonspecific ST changes.   Review of Systems:  Negative for fever, visual changes, sore throat, rash, new muscle aches, dysuria, bleeding, n/v  Past Medical History  Diagnosis Date  . Iron deficiency anemia   . Chronic combined systolic and diastolic CHF, NYHA class 2     a. 11/2013 Echo: EF 40-45%, basl-mid inflat AK, Gr 2 DD.  [name] Hyperlipidemia LDL goal <70   . Obstructive sleep apnea     a. not on home CPAP  . Ischemic cardiomyopathy     a. 11/2013 Echo: EF 40-45%, basl-mid inflat AK, Gr 2 DD, mild AI, mod dil LA/RA, mod reduced RV fxn, PASP 80mmHg.  [name] Homelessness   . Moderate to severe pulmonary hypertension 03/2010    a. PA peak pressure of 76 mmHg (per 2D Echo 03/2010)  . Polysubstance abuse     a. cocaine, THC  . AV malformation of gastrointestinal  tract     a. w/ h/o GIB.  [name] Family history of early CAD   . DDD (degenerative disc disease), lumbar   . Peripheral vascular disease   . Seizure disorder   . Hypertension   . CAD (coronary artery disease)     a. s/p 3-vessel CABG (12/2007) // 100% RCA stenosis with collaterals from left system. Severe bifurcation lesions of proxima CXA and OM. Moderate LAD disease - followed by Dr. Wyline Copas in [facility];  b. 11/2013 Myoview: Large fixed inf defect w/o ischemia, EF 35%.  . Diabetes   . Chronic kidney disease (CKD), stage III (moderate)     a. BL SCr 1.5-1.6  . Renal artery stenosis   . Seizures     Past Surgical History  Procedure Laterality Date  . Apc  03/2010    To treat small bowel AVMs  . Esophagogastroduodenoscopy N/A 08/14/2012    Procedure: ESOPHAGOGASTRODUODENOSCOPY (EGD);  Surgeon: [name], MD;  Location: [facility] ENDOSCOPY;  Service: Endoscopy;  Laterality: N/A;  . Hot hemostasis N/A 08/14/2012    Procedure: HOT HEMOSTASIS (ARGON PLASMA COAGULATION/BICAP);  Surgeon: [name], MD;  Location: [facility] ENDOSCOPY;  Service: Endoscopy;  Laterality: N/A;  . Esophagogastroduodenoscopy (egd) with propofol N/A 08/04/2013    Procedure: ESOPHAGOGASTRODUODENOSCOPY (EGD) WITH PROPOFOL;  Surgeon: [name], MD;  Location: [facility] ENDOSCOPY;  Service: Endoscopy;  Laterality: N/A;  . Coronary artery bypass graft  12/2007    Social History:  reports that he has quit smoking. His smoking use included Cigarettes. He has a 14 pack-year smoking history. He has never used  smokeless tobacco. He reports that he drinks alcohol. He reports that he uses illicit drugs (Cocaine and Marijuana) about once per week.  Allergies  Allergen Reactions  . Motrin [Ibuprofen] Other (See Comments)    Affects kidneys  . Tylenol [Acetaminophen] Other (See Comments)    Affects kidneys    Family History  Problem Relation Age of Onset  . Heart disease Mother     unknown type  . Heart disease Father 23    died of MI at  44yo  . Heart disease Paternal Grandfather 21    died of MI  . Heart disease    . Heart disease Brother 30     Prior to Admission medications   Medication Sig Start Date End Date Taking? Authorizing Provider  albuterol (PROVENTIL HFA;VENTOLIN HFA) 108 (90 BASE) MCG/ACT inhaler Inhale 2 puffs into the lungs every 6 (six) hours as needed for wheezing or shortness of breath. 04/30/14   Thurnell Lose, MD  amLODipine (NORVASC) 10 MG tablet Take 10 mg by mouth daily.    Historical Provider, MD  aspirin EC 81 MG tablet Take 81 mg by mouth daily.    Historical Provider, MD  citalopram (CELEXA) 20 MG tablet Take 20 mg by mouth daily.    Historical Provider, MD  cloNIDine (CATAPRES) 0.2 MG tablet Take 0.5 tablets (0.1 mg total) by mouth 2 (two) times daily. 02/09/14   Modena Jansky, MD  docusate sodium (COLACE) 100 MG capsule Take 100 mg by mouth 2 (two) times daily.    Historical Provider, MD  ferrous sulfate 325 (65 FE) MG tablet Take 325 mg by mouth 2 (two) times daily with a meal.     Historical Provider, MD  furosemide (LASIX) 80 MG tablet Take 80 mg by mouth daily.    Historical Provider, MD  gabapentin (NEURONTIN) 100 MG capsule Take 100 mg by mouth 2 (two) times daily.    Historical Provider, MD  hydrALAZINE (APRESOLINE) 50 MG tablet Take 1.5 tablets (75 mg total) by mouth 3 (three) times daily. 02/09/14   Modena Jansky, MD  isosorbide mononitrate (IMDUR) 30 MG 24 hr tablet Take 30 mg by mouth daily.    Historical Provider, MD  nitroGLYCERIN (NITROSTAT) 0.4 MG SL tablet Place 1 tablet (0.4 mg total) under the tongue every 5 (five) minutes as needed for chest pain (upto max 3 doses at one time.). 02/09/14   Modena Jansky, MD  pantoprazole (PROTONIX) 40 MG tablet Take 40 mg by mouth daily.     Historical Provider, MD  phenytoin (DILANTIN) 100 MG ER capsule Take 200-300 mg by mouth 2 (two) times daily. 200mg  in the morning, 300mg  in the evening    Historical Provider, MD  pravastatin  (PRAVACHOL) 40 MG tablet Take 1 tablet (40 mg total) by mouth daily at 6 PM. 02/09/14   Modena Jansky, MD   Physical Exam: Filed Vitals:   05/20/14 1115 05/20/14 1130 05/20/14 1145 05/20/14 1211  BP: 166/85 151/74 146/80 159/76  Pulse:    68  Temp:    97.7 F (36.5 C)  TempSrc:    Oral  Resp: 21 20 20 20   Height:      SpO2:    91%   General: Examined in the emergency department.  Appears calm and comfortable Eyes: PERRL, normal lids, irises  ENT: grossly normal hearing, lips & tongue Neck: Appears grossly unremarkable Cardiovascular: RRR, no m/r/g. 2+ bilateral lower extremity edema up to the thigh Respiratory: CTA  bilaterally, no w/r/r. Normal respiratory effort. Speaks in full sentences. Abdomen: soft, ntnd, no rebound Skin: Multiple wounds in various stages of healing. Musculoskeletal: grossly normal tone BUE/BLE Psychiatric: grossly normal mood and affect, speech fluent and appropriate. Oriented to self, location, month. Neurologic: grossly non-focal.  Wt Readings from Last 3 Encounters:  04/30/14 75.751 kg (167 lb)  02/09/14 80.967 kg (178 lb 8 oz)  01/02/14 81.738 kg (180 lb 3.2 oz)    Labs on Admission:  Basic Metabolic Panel:  Recent Labs Lab 05/20/14 0947  NA 130*  K 4.2  CL 100  CO2 25  GLUCOSE 79  BUN 22  CREATININE 1.30  CALCIUM 8.6   CBC:  Recent Labs Lab 05/20/14 0947  WBC 4.5  HGB 7.6*  HCT 25.0*  MCV 81.7  PLT 318    Cardiac Enzymes:  Recent Labs Lab 05/20/14 1026  CKTOTAL 67    Troponin (Point of Care Test)  Recent Labs  05/20/14 1012  TROPIPOC 0.02     Radiological Exams on Admission: Dg Chest Port 1 View  05/20/2014   CLINICAL DATA:  Cough and weakness, known heart failure  EXAM: PORTABLE CHEST - 1 VIEW  COMPARISON:  04/26/2014  FINDINGS: Cardiac shadow remains enlarged. Postsurgical changes are seen. Vascular congestion with pulmonary edema is noted consistent with CHF. No bony abnormality is noted.  IMPRESSION:  Changes consistent with CHF.   Electronically Signed   By: Inez Catalina M.D.   On: 05/20/2014 10:03     Principal Problem:   Acute on chronic combined systolic and diastolic heart failure Active Problems:   Iron deficiency anemia   CAD- CABG x3 '09. Myoview no ischemia 11/27/13   Chronic kidney disease (CKD), stage III (moderate)   Moderate to severe pulmonary hypertension   Polysubstance abuse   Chest pain   Cocaine abuse   Hyponatremia   Assessment/Plan 1. Acute on chronic systolic/diastolic congestive heart failure with evidence of volume overload. However no hypoxia or tachypnea. Echocardiogram 11/2013: Mild to moderate LV dysfunction, LVEF 40-45%. Grade 2 diastolic dysfunction. 2. Atypical chest pain, present for 3 days without relief. Initial troponin negative. Plan serial troponin but no further evaluation suggested unless troponins trend upwards. 3. Acute hyponatremia likely secondary to CHF, suspect complicated by low solute intake. Asymptomatic. 4. Ischemic cardiomyopathy, status post CABG 2009. Low-risk nuclear stress test September 2015 with fixed wall motion defect, EF 35-40 percent. 5. Diabetes mellitus. Stable. Blood sugar 79. Anion gap 5. 6. Normocytic anemia, suspect anemia of chronic disease/kidney disease. Appears stable at this point. 7. Chronic kidney disease stage III, Appears to be at baseline  8. Moderate to severe pulmonary hypertension by history. 9. Obstructive sleep apnea, does not use CPAP 10. Polysubstance abuse including cocaine with UDS positive for cocaine on admission 11. Seizure disorder   He appears stable at this point, plan admission to telemetry floor IV diuresis for heart failure. Plan to cycle troponin, however he has no signs or symptoms to suggest ACS, EKG is nonacute and initial troponin was negative.  Fluid restriction, recheck basic metabolic panel in the morning.  Sliding-scale insulin.  CBC in the morning  CPAP daily at  bedtime  Social work consult for polysubstance abuse.   Avoid beta blockers given cocaine use. Continue nitrates, vasodilators, clonidine.  Code Status: full code  DVT prophylaxis: Lovenox Family Communication: none  Disposition Plan/Anticipated LOS: admit, 2-3 days  Time spent: 55 minutes  Murray Hodgkins, MD  [facility] Pager [phone] 05/20/2014, 12:35 PM

## 2014-05-20 NOTE — Progress Notes (Signed)
Received phone call regarding pt being in floor. Entered room with pt sitting on side of bed. Two nurses and nurse tech in room. Assisted nurse tech to place pt in middle of bed. Pt A&O x 4, nero checked, with no deficits, Pt c\o pain in head 8/10 on a pain score of 0-10. Pt states "I was trying to get my phone out of the floor". Bed alarm was on and going off when nurse tech entered room. Dr. Sarajane Jews notified and orders obtained. Call bell in reach of pt, phone in reach of pt, bed alarm on. Family notified.  Vicente Males Therapist, sports

## 2014-05-20 NOTE — ED Provider Notes (Signed)
CSN: [id number]     Arrival date & time 05/20/14  2505 History   First MD Initiated Contact with Patient 05/20/14 [phone]     Chief Complaint  Patient presents with  . Cough  . Chest Pain  . Leg Pain  . Weakness     (Consider location/radiation/quality/duration/timing/severity/associated sxs/prior Treatment) Patient is a 69 y.o. male presenting with cough, chest pain, leg pain, and weakness. The history is provided by the patient.  Cough Cough characteristics:  Productive Sputum characteristics:  White Severity:  Moderate Onset quality:  Gradual Duration:  3 days Timing:  Constant Progression:  Unchanged Chronicity:  Recurrent Smoker: yes   Context: not occupational exposure and not sick contacts   Relieved by:  Nothing Worsened by:  Nothing tried Associated symptoms: chest pain ("like somebody sticking me,". Patient endorses this is worse than prior chest pains) and shortness of breath (constant, no alleviating or exacerbating factors)   Associated symptoms: no chills, no fever, no rash and no rhinorrhea   Chest Pain Associated symptoms: cough (with white phelgm. "I'm coughing like I have fluid on my lungs."), shortness of breath (constant, no alleviating or exacerbating factors) and weakness   Associated symptoms: no fever   Leg Pain Associated symptoms: no fever   Weakness Associated symptoms include chest pain ("like somebody sticking me,". Patient endorses this is worse than prior chest pains) and shortness of breath (constant, no alleviating or exacerbating factors).    Past Medical History  Diagnosis Date  . Iron deficiency anemia   . Chronic combined systolic and diastolic CHF, NYHA class 2     a. 11/2013 Echo: EF 40-45%, basl-mid inflat AK, Gr 2 DD.  [name] Hyperlipidemia LDL goal <70   . Obstructive sleep apnea     a. not on home CPAP  . Ischemic cardiomyopathy     a. 11/2013 Echo: EF 40-45%, basl-mid inflat AK, Gr 2 DD, mild AI, mod dil LA/RA, mod reduced RV fxn, PASP  61mmHg.  [name] Homelessness   . Moderate to severe pulmonary hypertension 03/2010    a. PA peak pressure of 76 mmHg (per 2D Echo 03/2010)  . Polysubstance abuse     a. cocaine, THC  . AV malformation of gastrointestinal tract     a. w/ h/o GIB.  [name] Family history of early CAD   . DDD (degenerative disc disease), lumbar   . Peripheral vascular disease   . Seizure disorder   . Hypertension   . CAD (coronary artery disease)     a. s/p 3-vessel CABG (12/2007) // 100% RCA stenosis with collaterals from left system. Severe bifurcation lesions of proxima CXA and OM. Moderate LAD disease - followed by Dr. Wyline Copas in [facility];  b. 11/2013 Myoview: Large fixed inf defect w/o ischemia, EF 35%.  . Diabetes   . Chronic kidney disease (CKD), stage III (moderate)     a. BL SCr 1.5-1.6  . Renal artery stenosis   . Seizures    Past Surgical History  Procedure Laterality Date  . Apc  03/2010    To treat small bowel AVMs  . Esophagogastroduodenoscopy N/A 08/14/2012    Procedure: ESOPHAGOGASTRODUODENOSCOPY (EGD);  Surgeon: [name], MD;  Location: [facility] ENDOSCOPY;  Service: Endoscopy;  Laterality: N/A;  . Hot hemostasis N/A 08/14/2012    Procedure: HOT HEMOSTASIS (ARGON PLASMA COAGULATION/BICAP);  Surgeon: [name], MD;  Location: [facility] ENDOSCOPY;  Service: Endoscopy;  Laterality: N/A;  . Esophagogastroduodenoscopy (egd) with propofol N/A 08/04/2013    Procedure: ESOPHAGOGASTRODUODENOSCOPY (EGD)  WITH PROPOFOL;  Surgeon: [name], MD;  Location: [facility] ENDOSCOPY;  Service: Endoscopy;  Laterality: N/A;  . Coronary artery bypass graft  12/2007   Family History  Problem Relation Age of Onset  . Heart disease Mother     unknown type  . Heart disease Father 81    died of MI at 73yo  . Heart disease Paternal Grandfather 47    died of MI  . Heart disease    . Heart disease Brother 30   History  Substance Use Topics  . Smoking status: Former Smoker -- 0.25 packs/day for 56 years    Types: Cigarettes  .  Smokeless tobacco: Never Used  . Alcohol Use: Yes     Comment: occasionally drinks, a few times a month    Review of Systems  Constitutional: Negative for fever and chills.  HENT: Negative for rhinorrhea.   Respiratory: Positive for cough (with white phelgm. "I'm coughing like I have fluid on my lungs.") and shortness of breath (constant, no alleviating or exacerbating factors).   Cardiovascular: Positive for chest pain ("like somebody sticking me,". Patient endorses this is worse than prior chest pains) and leg swelling (up to mid-thighs).  Skin: Negative for rash.  Neurological: Positive for weakness.  All other systems reviewed and are negative.     Allergies  Motrin and Tylenol  Home Medications   Prior to Admission medications   Medication Sig Start Date End Date Taking? Authorizing Provider  albuterol (PROVENTIL HFA;VENTOLIN HFA) 108 (90 BASE) MCG/ACT inhaler Inhale 2 puffs into the lungs every 6 (six) hours as needed for wheezing or shortness of breath. 04/30/14   Thurnell Lose, MD  amLODipine (NORVASC) 10 MG tablet Take 10 mg by mouth daily.    Historical Provider, MD  aspirin EC 81 MG tablet Take 81 mg by mouth daily.    Historical Provider, MD  citalopram (CELEXA) 20 MG tablet Take 20 mg by mouth daily.    Historical Provider, MD  cloNIDine (CATAPRES) 0.2 MG tablet Take 0.5 tablets (0.1 mg total) by mouth 2 (two) times daily. 02/09/14   Modena Jansky, MD  docusate sodium (COLACE) 100 MG capsule Take 100 mg by mouth 2 (two) times daily.    Historical Provider, MD  ferrous sulfate 325 (65 FE) MG tablet Take 325 mg by mouth 2 (two) times daily with a meal.     Historical Provider, MD  furosemide (LASIX) 80 MG tablet Take 80 mg by mouth daily.    Historical Provider, MD  gabapentin (NEURONTIN) 100 MG capsule Take 100 mg by mouth 2 (two) times daily.    Historical Provider, MD  hydrALAZINE (APRESOLINE) 50 MG tablet Take 1.5 tablets (75 mg total) by mouth 3 (three) times  daily. 02/09/14   Modena Jansky, MD  isosorbide mononitrate (IMDUR) 30 MG 24 hr tablet Take 30 mg by mouth daily.    Historical Provider, MD  nitroGLYCERIN (NITROSTAT) 0.4 MG SL tablet Place 1 tablet (0.4 mg total) under the tongue every 5 (five) minutes as needed for chest pain (upto max 3 doses at one time.). 02/09/14   Modena Jansky, MD  pantoprazole (PROTONIX) 40 MG tablet Take 40 mg by mouth daily.     Historical Provider, MD  phenytoin (DILANTIN) 100 MG ER capsule Take 200-300 mg by mouth 2 (two) times daily. 200mg  in the morning, 300mg  in the evening    Historical Provider, MD  pravastatin (PRAVACHOL) 40 MG tablet Take 1 tablet (40  mg total) by mouth daily at 6 PM. 02/09/14   Modena Jansky, MD   BP 176/99 mmHg  Pulse 67  Temp(Src) 97.7 F (36.5 C) (Oral)  Resp 23  Ht 5\' 5"  (1.651 m)  SpO2 96% Physical Exam  Constitutional: He is oriented to person, place, and time. He appears well-developed and well-nourished. No distress.  HENT:  Head: Normocephalic and atraumatic.  Mouth/Throat: Oropharynx is clear and moist. No oropharyngeal exudate.  Eyes: EOM are normal. Pupils are equal, round, and reactive to light.  Neck: Normal range of motion. Neck supple.  Cardiovascular: Normal rate and regular rhythm.  Exam reveals no friction rub.   No murmur heard. Pulmonary/Chest: Effort normal and breath sounds normal. No respiratory distress. He has no wheezes. He has no rales. He exhibits no tenderness.  Abdominal: Soft. He exhibits no distension. There is tenderness (diffusely). There is no rebound.  Musculoskeletal: Normal range of motion. He exhibits no edema.  Neurological: He is alert and oriented to person, place, and time.  Skin: No rash noted. He is not diaphoretic.  Nursing note and vitals reviewed.   ED Course  Procedures (including critical care time) Labs Review Labs Reviewed  CBC - Abnormal; Notable for the following:    RBC 3.06 (*)    Hemoglobin 7.6 (*)    HCT  25.0 (*)    MCH 24.8 (*)    RDW 19.1 (*)    All other components within normal limits  BASIC METABOLIC PANEL - Abnormal; Notable for the following:    Sodium 130 (*)    GFR calc non Af Amer 54 (*)    GFR calc Af Amer 63 (*)    All other components within normal limits  BRAIN NATRIURETIC PEPTIDE - Abnormal; Notable for the following:    B Natriuretic Peptide 2249.6 (*)    All other components within normal limits  CK  URINE RAPID DRUG SCREEN (HOSP PERFORMED)  Randolm Idol, ED    Imaging Review Dg Chest Port 1 View  05/20/2014   CLINICAL DATA:  Cough and weakness, known heart failure  EXAM: PORTABLE CHEST - 1 VIEW  COMPARISON:  04/26/2014  FINDINGS: Cardiac shadow remains enlarged. Postsurgical changes are seen. Vascular congestion with pulmonary edema is noted consistent with CHF. No bony abnormality is noted.  IMPRESSION: Changes consistent with CHF.   Electronically Signed   By: Inez Catalina M.D.   On: 05/20/2014 10:03     EKG Interpretation   Date/Time:  Wednesday May 20 2014 09:33:48 EST Ventricular Rate:  66 PR Interval:  180 QRS Duration: 125 QT Interval:  427 QTC Calculation: 447 R Axis:   70 Text Interpretation:  Sinus rhythm Nonspecific intraventricular conduction  delay Probable inferior infarct, age indeterminate No significant change  since last tracing Confirmed by Mingo Amber  MD, [facility] [phone]) on 05/20/2014  9:45:34 AM      MDM   Final diagnoses:  CHF exacerbation  Other chest pain    69 year old male with history of ischemic cardiomyopathy, CHF, cocaine abuse, tobacco abuse presents with shortness of breath, cough, chest pain. All been going on for the past 3-4 days. Reports chest pain as left-sided, sharp, nonradiating. He says it's worse than prior chest pains. No alleviating or exacerbating factors. Shortness of breath and cough are similar to when he has had fluid on his lung prior. Cough productive of white sputum. He also endorses body pains, leg pain,  leg swelling up to his hips. On exam he has  peripheral edema up to his knees that is 1+ and trace peripheral edema in his posterior thighs up to mid thigh. He has no rales, other adventitious lung sounds. No respiratory distress. I counseled him that cocaine can kill him and he needs to stop.  EKG similar prior. We'll check labs. Lasix given with chest x-ray that shows pulmonary edema. Patient is on 80 mg of Lasix a day, given 100 mg IV. Patient was recently admitted for this and cleared by cardiology. He had a negative stress test in September of last year with fixed wall motion defect and EF around 35-40%. CXR with fluid overload. Sleeping but stating he's still feeling bad. BNP still elevated. Admitted.    Evelina Bucy, MD 05/20/14 1247

## 2014-05-20 NOTE — ED Notes (Signed)
Sent pharmacy another message to send lasix

## 2014-05-20 NOTE — Clinical Documentation Improvement (Addendum)
  Diabetes Mellitus documented in current record.  Known history of stage 3 ckd.  Taking Neurontin prior to admission according to Med Rec.  Please document the Type of DM (1 or 2) and if the patient has any Diabetic Associated Conditions in the progress notes and discharge summary.  If the diagnosis of Diabetes Mellitus is no long applicable to this patient, please document a history of DM (including type) no longer requiring treatment.   Thank You, Erling Conte ,RN Clinical Documentation Specialist:  [phone] [place] Information Management

## 2014-05-20 NOTE — ED Notes (Signed)
I and O cath attempted. Pt reports strictures. Unsuccessful in getting sample

## 2014-05-21 DIAGNOSIS — R0789 Other chest pain: Secondary | ICD-10-CM

## 2014-05-21 LAB — CBC
HCT: 24.4 % — ABNORMAL LOW (ref 39.0–52.0)
HEMOGLOBIN: 7.4 g/dL — AB (ref 13.0–17.0)
MCH: 25.2 pg — ABNORMAL LOW (ref 26.0–34.0)
MCHC: 30.3 g/dL (ref 30.0–36.0)
MCV: 83 fL (ref 78.0–100.0)
PLATELETS: 279 10*3/uL (ref 150–400)
RBC: 2.94 MIL/uL — ABNORMAL LOW (ref 4.22–5.81)
RDW: 19.2 % — ABNORMAL HIGH (ref 11.5–15.5)
WBC: 5.5 10*3/uL (ref 4.0–10.5)

## 2014-05-21 LAB — BASIC METABOLIC PANEL
ANION GAP: 11 (ref 5–15)
BUN: 24 mg/dL — ABNORMAL HIGH (ref 6–23)
CALCIUM: 8.6 mg/dL (ref 8.4–10.5)
CHLORIDE: 98 mmol/L (ref 96–112)
CO2: 24 mmol/L (ref 19–32)
CREATININE: 1.36 mg/dL — AB (ref 0.50–1.35)
GFR calc Af Amer: 59 mL/min — ABNORMAL LOW (ref >90)
GFR calc non Af Amer: 51 mL/min — ABNORMAL LOW (ref >90)
GLUCOSE: 92 mg/dL (ref 70–99)
Potassium: 4.2 mmol/L (ref 3.5–5.1)
SODIUM: 133 mmol/L — AB (ref 135–145)

## 2014-05-21 LAB — PREPARE RBC (CROSSMATCH)

## 2014-05-21 LAB — TROPONIN I: Troponin I: 0.04 ng/mL — ABNORMAL HIGH (ref <0.031)

## 2014-05-21 LAB — GLUCOSE, CAPILLARY: GLUCOSE-CAPILLARY: 94 mg/dL (ref 70–99)

## 2014-05-21 MED ORDER — FUROSEMIDE 10 MG/ML IJ SOLN
20.0000 mg | Freq: Once | INTRAMUSCULAR | Status: AC
  Administered 2014-05-22: 20 mg via INTRAVENOUS

## 2014-05-21 MED ORDER — MORPHINE SULFATE 2 MG/ML IJ SOLN
1.0000 mg | INTRAMUSCULAR | Status: DC | PRN
  Administered 2014-05-21 – 2014-05-23 (×7): 1 mg via INTRAVENOUS
  Filled 2014-05-21 (×8): qty 1

## 2014-05-21 MED ORDER — CHLORHEXIDINE GLUCONATE CLOTH 2 % EX PADS
6.0000 | MEDICATED_PAD | Freq: Every day | CUTANEOUS | Status: DC
  Administered 2014-05-23 – 2014-05-24 (×2): 6 via TOPICAL

## 2014-05-21 MED ORDER — SODIUM CHLORIDE 0.9 % IV SOLN
Freq: Once | INTRAVENOUS | Status: AC
  Administered 2014-05-21: 16:00:00 via INTRAVENOUS

## 2014-05-21 MED ORDER — MUPIROCIN 2 % EX OINT
1.0000 "application " | TOPICAL_OINTMENT | Freq: Two times a day (BID) | CUTANEOUS | Status: DC
  Administered 2014-05-21 – 2014-05-24 (×7): 1 via NASAL
  Filled 2014-05-21: qty 22

## 2014-05-21 NOTE — Progress Notes (Signed)
Utilization review completed.  

## 2014-05-21 NOTE — Progress Notes (Signed)
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 Weight on 2-11 was at 75 Kg.  LVEF 40-45%. Continue with lasix 40 Mg IV BID.  ECHO ordered.  Weight 81---79.   Anemia; repeat Hb if low , might need blood transfusion.   Chest pain;  Still complaining of chest pain. Mildly elevated troponin.  Morphine IV, Nitroglycerin PRN.  Cardiology consulted.  Continue with Imdur, statin, aspirin.   Seizure; continue with dilantin.  HTN; continue with Norvasc, clonidine.   Acute hyponatremia; in setting heart failure. Improving.   Diabetes mellitus; check HB A-1c.   Polysubstance abuse including cocaine with UDS positive for cocaine on admission Counseling provide.   Code Status: Full Code.  Family Communication: Care discussed with pateint Disposition Plan: Remain inpatient.    Consultants:  Cardiology  Procedures:  ECHO; pending.   Antibiotics:  none  HPI/Subjective: He is still complaining of chest pain, on and  off. He is also complaining of LE pain and swelling. Leg pain started Sunday.    Objective: Filed Vitals:   05/21/14 0700  BP: 139/82  Pulse: 87  Temp: 98.3 F (36.8 C)  Resp: 20    Intake/Output Summary (Last 24 hours) at 05/21/14 0726 Last data filed at 05/20/14 2200  Gross per 24 hour  Intake      3 ml  Output    350 ml  Net   -347 ml   Filed Weights   05/20/14 1405 05/21/14 0700  Weight: 81.1 kg (178 lb 12.7 oz) 79.5 kg (175 lb 4.3 oz)    Exam:   General:  Sitting in the chair in no distress.   Cardiovascular: S 1, S 2 RRR  Respiratory: Bilateral crackles, ronchus  Abdomen: BS present, soft, nt  Musculoskeletal: plus 2 edema.    Data Reviewed: Basic Metabolic Panel:  Recent Labs Lab 05/20/14 0947 05/21/14 0403  NA 130* 133*  K 4.2 4.2  CL 100 98  CO2 25 24  GLUCOSE 79 92   BUN 22 24*  CREATININE 1.30 1.36*  CALCIUM 8.6 8.6   Liver Function Tests: No results for input(s): AST, ALT, ALKPHOS, BILITOT, PROT, ALBUMIN in the last 168 hours. No results for input(s): LIPASE, AMYLASE in the last 168 hours. No results for input(s): AMMONIA in the last 168 hours. CBC:  Recent Labs Lab 05/20/14 0947  WBC 4.5  HGB 7.6*  HCT 25.0*  MCV 81.7  PLT 318   Cardiac Enzymes:  Recent Labs Lab 05/20/14 1026 05/20/14 1700 05/20/14 2145 05/21/14 0403  CKTOTAL 67  --   --   --   TROPONINI  --  0.08* 0.04* 0.04*   BNP (last 3 results)  Recent Labs  04/26/14 0904 05/20/14 0947  BNP 2313.3* 2249.6*    ProBNP (last 3 results)  Recent Labs  01/01/14 1107 02/07/14 0123 03/01/14 2250  PROBNP 3225.0* 2648.0* 7421.0*    CBG:  Recent Labs Lab 05/20/14 1616 05/20/14 2126 05/21/14 0622  GLUCAP 172* 122* 94    No results found for this or any previous visit (from the past 240 hour(s)).   Studies: Dg Chest Port 1 View  05/20/2014   CLINICAL DATA:  Cough and weakness, known heart failure  EXAM: PORTABLE CHEST - 1 VIEW  COMPARISON:  04/26/2014  FINDINGS: Cardiac shadow remains enlarged. Postsurgical changes are seen. Vascular congestion with pulmonary edema is noted consistent with CHF. No bony abnormality  is noted.  IMPRESSION: Changes consistent with CHF.   Electronically Signed   By: Inez Catalina M.D.   On: 05/20/2014 10:03    Scheduled Meds: . amLODipine  10 mg Oral Daily  . aspirin EC  81 mg Oral Daily  . citalopram  20 mg Oral Daily  . cloNIDine  0.1 mg Oral BID  . furosemide  40 mg Intravenous Q12H  . gabapentin  100 mg Oral BID  . hydrALAZINE  75 mg Oral TID  . insulin aspart  0-9 Units Subcutaneous TID WC  . isosorbide mononitrate  30 mg Oral Daily  . pantoprazole  40 mg Oral Daily  . phenytoin  200 mg Oral Daily  . phenytoin  300 mg Oral QHS  . pravastatin  40 mg Oral q1800  . 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 Myoview no ischemia 11/27/13   Chronic kidney disease (CKD), stage III (moderate)   Moderate to severe pulmonary hypertension   Polysubstance abuse   Chest pain   Cocaine abuse   Hyponatremia   Acute on chronic combined systolic and diastolic CHF (congestive heart failure)    Time spent: 35 minutes.     Niel Hummer A  [facility] Pager [phone]. If 7PM-7AM, please contact night-coverage at [email or web address], password [facility] 05/21/2014, 7:26 AM  LOS: 1 day

## 2014-05-21 NOTE — Consult Note (Signed)
CARDIOLOGY CONSULT NOTE  Patient ID: Charles [name], MRN: [id number], DOB/AGE: Aug 12, 1945 69 y.o. Admit date: 05/20/2014 Date of Consult: 05/21/2014  Primary Physician: Dr. [name]  Primary Cardiologist: None Referring Physician: Dr. Tyrell Antonio  Chief Complaint: Chest pain, leg swelling  Reason for Consultation: CHF management   HPI:  Charles [name] is a 69yo man with PMHx of combined CHF, ischemic cardiomyopathy (last echo 11/2013 with EF 40-45%, akinesis of basal-midinferolateral myocardium, grade 2 diastolic dysfunction), CAD s/p CABG 2009, moderate-severe pulmonary hypertension, OSA not using CPAP, HTN, Type 2 DM, CKD Stage 3, hx of GI bleed, and cocaine abuse who presented with chest pain and leg swelling. Patient reports a few days before admission he had constant, sharp, non-radiating, left-sided chest pain that was a 10/10 in severity. He also notes his legs began swelling a few days ago and are "achy all over." He reports associated dyspnea, dry cough, dizziness, and nausea, but denies palpitations and diaphoresis. He states his chest pain is currently 9/10 in severity. He states his legs are bothering him and he has difficulty walking due to pain. He reports cocaine use, approximately once a week.   On admission, troponins 0.08>0.04>0.04. EKG with T wave inversions in aVL and V1, but are old. BNP 2250, similar to 2313 about three weeks ago when he was hospitalized for an acute CHF exacerbation.    Medical History:  Past Medical History  Diagnosis Date  . Iron deficiency anemia   . Chronic combined systolic and diastolic CHF, NYHA class 2     a. 11/2013 Echo: EF 40-45%, basl-mid inflat AK, Gr 2 DD.  [name] Hyperlipidemia LDL goal <70   . Obstructive sleep apnea     a. not on home CPAP  . Ischemic cardiomyopathy     a. 11/2013 Echo: EF 40-45%, basl-mid inflat AK, Gr 2 DD, mild AI, mod dil LA/RA, mod reduced RV fxn, PASP 55mmHg.  [name] Homelessness   . Moderate to severe pulmonary  hypertension 03/2010    a. PA peak pressure of 76 mmHg (per 2D Echo 03/2010)  . Polysubstance abuse     a. cocaine, THC  . AV malformation of gastrointestinal tract     a. w/ h/o GIB.  [name] Family history of early CAD   . DDD (degenerative disc disease), lumbar   . Peripheral vascular disease   . Seizure disorder   . Hypertension   . CAD (coronary artery disease)     a. s/p 3-vessel CABG (12/2007) // 100% RCA stenosis with collaterals from left system. Severe bifurcation lesions of proxima CXA and OM. Moderate LAD disease - followed by Dr. Wyline Copas in [facility];  b. 11/2013 Myoview: Large fixed inf defect w/o ischemia, EF 35%.  . Diabetes   . Chronic kidney disease (CKD), stage III (moderate)     a. BL SCr 1.5-1.6  . Renal artery stenosis   . Seizures       Surgical History:  Past Surgical History  Procedure Laterality Date  . Apc  03/2010    To treat small bowel AVMs  . Esophagogastroduodenoscopy N/A 08/14/2012    Procedure: ESOPHAGOGASTRODUODENOSCOPY (EGD);  Surgeon: [name], MD;  Location[facility] ENDOSCOPY;  Service: Endoscopy;  Laterality: N/A;  . Hot hemostasis N/A 08/14/2012    Procedure: HOT HEMOSTASIS (ARGON PLASMA COAGULATION/BICAP);  Surgeon: [name], MD;  Location: [facility] ENDOSCOPY;  Service: Endoscopy;  Laterality: N/A;  . Esophagogastroduodenoscopy (egd) with propofol N/A 08/04/2013    Procedure: ESOPHAGOGASTRODUODENOSCOPY (EGD) WITH PROPOFOL;  Surgeon: [name], MD;  Location: [facility] ENDOSCOPY;  Service: Endoscopy;  Laterality: N/A;  . Coronary artery bypass graft  12/2007     Home Meds: Prior to Admission medications   Medication Sig Start Date End Date Taking? Authorizing Provider  albuterol (PROVENTIL HFA;VENTOLIN HFA) 108 (90 BASE) MCG/ACT inhaler Inhale 2 puffs into the lungs every 6 (six) hours as needed for wheezing or shortness of breath. 04/30/14  Yes Thurnell Lose, MD  amLODipine (NORVASC) 10 MG tablet Take 10 mg by mouth daily.   Yes Historical Provider, MD    aspirin EC 81 MG tablet Take 81 mg by mouth daily.   Yes Historical Provider, MD  citalopram (CELEXA) 20 MG tablet Take 20 mg by mouth daily.   Yes Historical Provider, MD  cloNIDine (CATAPRES) 0.2 MG tablet Take 0.5 tablets (0.1 mg total) by mouth 2 (two) times daily. 02/09/14  Yes Modena Jansky, MD  ferrous sulfate 325 (65 FE) MG tablet Take 325 mg by mouth 2 (two) times daily with a meal.    Yes Historical Provider, MD  furosemide (LASIX) 80 MG tablet Take 80 mg by mouth daily.   Yes Historical Provider, MD  gabapentin (NEURONTIN) 100 MG capsule Take 100 mg by mouth 2 (two) times daily.   Yes Historical Provider, MD  hydrALAZINE (APRESOLINE) 50 MG tablet Take 1.5 tablets (75 mg total) by mouth 3 (three) times daily. 02/09/14  Yes Modena Jansky, MD  isosorbide mononitrate (IMDUR) 30 MG 24 hr tablet Take 30 mg by mouth daily.   Yes Historical Provider, MD  pantoprazole (PROTONIX) 40 MG tablet Take 40 mg by mouth daily.    Yes Historical Provider, MD  phenytoin (DILANTIN) 100 MG ER capsule Take 200-300 mg by mouth 2 (two) times daily. 200mg  in the morning, 300mg  in the evening   Yes Historical Provider, MD  pravastatin (PRAVACHOL) 40 MG tablet Take 1 tablet (40 mg total) by mouth daily at 6 PM. 02/09/14  Yes Modena Jansky, MD  nitroGLYCERIN (NITROSTAT) 0.4 MG SL tablet Place 1 tablet (0.4 mg total) under the tongue every 5 (five) minutes as needed for chest pain (upto max 3 doses at one time.). 02/09/14   Modena Jansky, MD    Inpatient Medications:  . amLODipine  10 mg Oral Daily  . aspirin EC  81 mg Oral Daily  . citalopram  20 mg Oral Daily  . cloNIDine  0.1 mg Oral BID  . furosemide  40 mg Intravenous Q12H  . gabapentin  100 mg Oral BID  . hydrALAZINE  75 mg Oral TID  . insulin aspart  0-9 Units Subcutaneous TID WC  . isosorbide mononitrate  30 mg Oral Daily  . pantoprazole  40 mg Oral Daily  . phenytoin  200 mg Oral Daily  . phenytoin  300 mg Oral QHS  . pravastatin  40  mg Oral q1800  . sodium chloride  3 mL Intravenous Q12H      Allergies:  Allergies  Allergen Reactions  . Motrin [Ibuprofen] Other (See Comments)    Affects kidneys  . Tylenol [Acetaminophen] Other (See Comments)    Affects kidneys     Family History  Problem Relation Age of Onset  . Heart disease Mother     unknown type  . Heart disease Father 22    died of MI at 23yo  . Heart disease Paternal Grandfather 54    died of MI  . Heart disease    .  Heart disease Brother 30     Review of Systems: General: negative for chills, fever, night sweats or weight changes.  ENT: negative for rhinorrhea or epistaxis Cardiovascular: Refer to HPI Dermatological: negative for rash Respiratory: negative for wheezing GI: negative for diarrhea, bright red blood per rectum, melena, or hematemesis GU: no hematuria, urgency, or frequency Neurologic: negative for visual changes, syncope, headache Heme: no easy bruising or bleeding Endo: negative for excessive thirst, thyroid disorder, or flushing Musculoskeletal: negative for joint pain or swelling, negative for myalgias  All other systems reviewed and are otherwise negative except as noted above.  Physical Exam: Blood pressure 129/102, pulse 87, temperature 98.3 F (36.8 C), temperature source Oral, resp. rate 20, height 5\' 5"  (1.651 m), weight 175 lb 4.3 oz (79.5 kg), SpO2 95 %. General: alert, sitting up in chair, NAD HEENT: [place]/AT, EOMI, mucus membranes moist Neck: no JVD appreciated  CV: RRR, no m/g/r Pulm: minimal crackles at bases bilaterally, breaths non-labored Abd: BS+, soft, non-tender Ext: warm, trace- 1+ pitting edema in lower extremities bilaterally Neuro: alert and oriented x 3, no focal deficits    Labs:  Recent Labs  05/20/14 1026 05/20/14 1700 05/20/14 2145 05/21/14 0403  CKTOTAL 67  --   --   --   TROPONINI  --  0.08* 0.04* 0.04*   Lab Results  Component Value Date   WBC 4.5 05/20/2014   HGB 7.6*  05/20/2014   HCT 25.0* 05/20/2014   MCV 81.7 05/20/2014   PLT 318 05/20/2014    Recent Labs Lab 05/21/14 0403  NA 133*  K 4.2  CL 98  CO2 24  BUN 24*  CREATININE 1.36*  CALCIUM 8.6  GLUCOSE 92   Lab Results  Component Value Date   CHOL 88 10/15/2013   HDL 32* 10/15/2013   LDLCALC 42 10/15/2013   TRIG 69 10/15/2013   Lab Results  Component Value Date   DDIMER 0.81* 10/23/2013    Radiology/Studies:  Dg Chest 2 View  04/26/2014   CLINICAL DATA:  Left-sided chest pain and shortness of breath since this morning. Bilateral leg swelling 3 days.  EXAM: CHEST  2 VIEW  COMPARISON:  03/01/2014 and 01/01/2014  FINDINGS: Sternotomy wires unchanged. Lungs are adequately inflated without focal consolidation. Mild prominence of the perihilar markings unchanged likely mild degree of vascular congestion. Possible small amount left pleural fluid. Moderate stable cardiomegaly. Remainder of the exam is unchanged.  IMPRESSION: Stable cardiomegaly with suggestion of minimal vascular congestion. Possible small amount left pleural fluid.   Electronically Signed   By: Marin Olp M.D.   On: 04/26/2014 09:45   Dg Chest Port 1 View  05/20/2014   CLINICAL DATA:  Cough and weakness, known heart failure  EXAM: PORTABLE CHEST - 1 VIEW  COMPARISON:  04/26/2014  FINDINGS: Cardiac shadow remains enlarged. Postsurgical changes are seen. Vascular congestion with pulmonary edema is noted consistent with CHF. No bony abnormality is noted.  IMPRESSION: Changes consistent with CHF.   Electronically Signed   By: Inez Catalina M.D.   On: 05/20/2014 10:03   Ct Angio Chest Aorta W/cm &/or Wo/cm  04/26/2014   CLINICAL DATA:  Left-sided chest and. Rule out dissection. Initial encounter. Left leg swelling. Abdominal bloating. Diabetes. Coronary artery disease.  EXAM: CT ANGIOGRAPHY CHEST, ABDOMEN AND PELVIS  TECHNIQUE: Multidetector CT imaging through the chest, abdomen and pelvis was performed using the standard protocol during  bolus administration of intravenous contrast. Multiplanar reconstructed images and MIPs were obtained and reviewed  to evaluate the vascular anatomy.  CONTRAST:  172mL OMNIPAQUE IOHEXOL 350 MG/ML SOLN  COMPARISON:  Chest radiograph of 04/26/2014. Chest CT 10/23/2013. Abdominal pelvic CT of 07/16/2013.  FINDINGS: CT CHEST FINDINGS  Mediastinum/Nodes: Mild bilateral gynecomastia.  Normal caliber the great vessels, with mild atherosclerosis within. Prior median sternotomy for CABG. No evidence of thoracic aortic dissection. Ascending aorta measures 4.4 cm at the sinuses of Valsalva, 3.2 cm at the sino-tubular junction, and 3.8 cm at the mid ascending segment. Felt to be similar.  Moderate cardiomegaly, without pericardial effusion. Pulmonary artery is enlarged, with the outflow tract measuring 4.1 cm. No pulmonary embolism identified.  Mild thoracic adenopathy. Example 1.6 cm precarinal node, unchanged. This is unchanged since the prior exam. No hilar adenopathy.  Lungs/Pleura: Mild motion degradation. Poor inspiratory effort. Mild mosaic attenuation which could relate to air trapping or mild pulmonary venous congestion.  Small left greater than right pleural effusions, increased since 10/23/2013.  Musculoskeletal: No acute osseous abnormality.  CT ABDOMEN PELVIS FINDINGS  Hepatobiliary: Mildly irregular hepatic capsule, suggesting cirrhosis. Normal gallbladder and biliary tract.  Motion degradation continues into the abdomen.  Pancreas: No pancreatic duct dilatation or focal mass. There is interstitial thickening in the peripancreatic space, including on image 143.  Spleen: Normal  Adrenals/Urinary Tract: Normal adrenal glands. Interpolar left renal lesion measures 1.4 cm and greater than fluid density on image 138. This was present back to 03/24/2010, and similar in size on 07/16/2013. Normal right kidney, without hydronephrosis.  Bladder wall is thickened.  High density material within.  Stomach/Bowel: Proximal  gastric underdistention. Normal colon, appendix, and terminal ileum. Normal small bowel.  Vascular/Lymphatic: Abdominal aorta is normal in caliber, with atherosclerosis within. Atherosclerotic irregularity at the origin of both renal arteries, without hemodynamically significant stenosis. The inferior mesenteric artery is patent.  The left internal iliac is significantly narrowed at its origin. No abdominopelvic adenopathy.  Reproductive: Normal prostate.  Other: Small fat containing left inguinal hernia. Small volume abdominal pelvic ascites.  Diffuse anasarca  Musculoskeletal: Degenerative disc disease at the L4-5 level. Multiple lumbosacral disc bulges. S-shaped thoracolumbar spine curvature.  Review of the MIP images confirms the above findings.  IMPRESSION: CT CHEST IMPRESSION  1. Mild motion degradation. 2. No evidence of thoracic aortic dissection. Mild ascending aortic prominence, as detailed above. Recommend annual imaging followup by CTA or MRA. This recommendation follows 2010 ACCF/AHA/AATS/ACR/ASA/SCA/SCAI/SIR/STS/SVM Guidelines for the Diagnosis and Management of Patients With Thoracic Aortic Disease. Circulation. 2010; 121: I712-W580 3. Probable congestive heart failure, with bilateral pleural effusions. 4. Pulmonary artery enlargement suggests pulmonary arterial hypertension. 5. Similar mediastinal adenopathy. This could be related to congestive heart failure. Recommend attention on follow-up.  CT ABDOMEN AND PELVIS IMPRESSION  1. Mild motion degradation. 2. Advanced aortic atherosclerosis, without dissection or aneurysm. 3. Abdominal pelvic ascites.  Suspect mild cirrhosis. 4. Peripancreatic interstitial thickening. Cannot exclude pancreatitis. This appearance is nonspecific in the setting of ascites. 5. Pericystic edema. This could represent cystitis. Increased density within the urinary bladder could represent Early contrast excretion or hemorrhage. Consider correlation with urinalysis.    Electronically Signed   By: Abigail Miyamoto M.D.   On: 04/26/2014 11:44   Ct Cta Abd/pel W/cm &/or W/o Cm  04/26/2014   CLINICAL DATA:  Left-sided chest and. Rule out dissection. Initial encounter. Left leg swelling. Abdominal bloating. Diabetes. Coronary artery disease.  EXAM: CT ANGIOGRAPHY CHEST, ABDOMEN AND PELVIS  TECHNIQUE: Multidetector CT imaging through the chest, abdomen and pelvis was performed using the standard protocol during  bolus administration of intravenous contrast. Multiplanar reconstructed images and MIPs were obtained and reviewed to evaluate the vascular anatomy.  CONTRAST:  166mL OMNIPAQUE IOHEXOL 350 MG/ML SOLN  COMPARISON:  Chest radiograph of 04/26/2014. Chest CT 10/23/2013. Abdominal pelvic CT of 07/16/2013.  FINDINGS: CT CHEST FINDINGS  Mediastinum/Nodes: Mild bilateral gynecomastia.  Normal caliber the great vessels, with mild atherosclerosis within. Prior median sternotomy for CABG. No evidence of thoracic aortic dissection. Ascending aorta measures 4.4 cm at the sinuses of Valsalva, 3.2 cm at the sino-tubular junction, and 3.8 cm at the mid ascending segment. Felt to be similar.  Moderate cardiomegaly, without pericardial effusion. Pulmonary artery is enlarged, with the outflow tract measuring 4.1 cm. No pulmonary embolism identified.  Mild thoracic adenopathy. Example 1.6 cm precarinal node, unchanged. This is unchanged since the prior exam. No hilar adenopathy.  Lungs/Pleura: Mild motion degradation. Poor inspiratory effort. Mild mosaic attenuation which could relate to air trapping or mild pulmonary venous congestion.  Small left greater than right pleural effusions, increased since 10/23/2013.  Musculoskeletal: No acute osseous abnormality.  CT ABDOMEN PELVIS FINDINGS  Hepatobiliary: Mildly irregular hepatic capsule, suggesting cirrhosis. Normal gallbladder and biliary tract.  Motion degradation continues into the abdomen.  Pancreas: No pancreatic duct dilatation or focal mass.  There is interstitial thickening in the peripancreatic space, including on image 143.  Spleen: Normal  Adrenals/Urinary Tract: Normal adrenal glands. Interpolar left renal lesion measures 1.4 cm and greater than fluid density on image 138. This was present back to 03/24/2010, and similar in size on 07/16/2013. Normal right kidney, without hydronephrosis.  Bladder wall is thickened.  High density material within.  Stomach/Bowel: Proximal gastric underdistention. Normal colon, appendix, and terminal ileum. Normal small bowel.  Vascular/Lymphatic: Abdominal aorta is normal in caliber, with atherosclerosis within. Atherosclerotic irregularity at the origin of both renal arteries, without hemodynamically significant stenosis. The inferior mesenteric artery is patent.  The left internal iliac is significantly narrowed at its origin. No abdominopelvic adenopathy.  Reproductive: Normal prostate.  Other: Small fat containing left inguinal hernia. Small volume abdominal pelvic ascites.  Diffuse anasarca  Musculoskeletal: Degenerative disc disease at the L4-5 level. Multiple lumbosacral disc bulges. S-shaped thoracolumbar spine curvature.  Review of the MIP images confirms the above findings.  IMPRESSION: CT CHEST IMPRESSION  1. Mild motion degradation. 2. No evidence of thoracic aortic dissection. Mild ascending aortic prominence, as detailed above. Recommend annual imaging followup by CTA or MRA. This recommendation follows 2010 ACCF/AHA/AATS/ACR/ASA/SCA/SCAI/SIR/STS/SVM Guidelines for the Diagnosis and Management of Patients With Thoracic Aortic Disease. Circulation. 2010; 121: V616-W737 3. Probable congestive heart failure, with bilateral pleural effusions. 4. Pulmonary artery enlargement suggests pulmonary arterial hypertension. 5. Similar mediastinal adenopathy. This could be related to congestive heart failure. Recommend attention on follow-up.  CT ABDOMEN AND PELVIS IMPRESSION  1. Mild motion degradation. 2. Advanced  aortic atherosclerosis, without dissection or aneurysm. 3. Abdominal pelvic ascites.  Suspect mild cirrhosis. 4. Peripancreatic interstitial thickening. Cannot exclude pancreatitis. This appearance is nonspecific in the setting of ascites. 5. Pericystic edema. This could represent cystitis. Increased density within the urinary bladder could represent Early contrast excretion or hemorrhage. Consider correlation with urinalysis.   Electronically Signed   By: Abigail Miyamoto M.D.   On: 04/26/2014 11:44    EKG:  Sinus rhythm, nonspecific intraventricular conduction delay, unchanged from previous   Cardiac Studies: 2D Echo 11/27/13>> EF 40-45%, akinesis of the basal-midinferolateral myocardium, grade 2 diastolic dysfunction Myoview 11/29/13>> fixed large inferior wall defect, low risk myoview  ASSESSMENT AND  PLAN:   Mild CHF Exacerbation: Patient presented with left-sided chest pain and leg swelling found to have minimally elevated troponins, BNP 2250, and UDS positive for cocaine. He does not appear to be in an overt CHF exacerbation as he has no JVD on exam, minimal rales, and trace-1+ pitting edema in his bilateral lower extremities on exam. His mildly elevated troponins are likely due to demand ischemia. EKG with no changes from previous. He likely has chronic chest pain in the setting of his heart failure and continued cocaine abuse. He has received IV Lasix 100 mg yesterday and on IV Lasix 40 mg BID today, seems to have responded well.  - Recommend repeat echocardiogram to assess LV function - Continue Lasix 40 mg BID - Continue medical therapy, avoid beta blockers given cocaine use  - Best option would be for patient to stop using cocaine, but does not seem agreeable to this plan   Signed, Albin Felling MD 05/21/2014, 10:50 AM   Patient seen, examined. Available data reviewed. Agree with findings, assessment, and plan as outlined by Dr Arcelia Jew. The patient was independently interviewed and examined.  All available data reviewed, including his most recent echocardiogram, laboratory data, and EKG. Exam reveals an alert, oriented male in no distress. Jugular venous pressure is minimally elevated. Lung fields are clear except for prolonged expiratory phase. Heart is regular rate and rhythm with a widely split S2. There is mild peripheral edema. The patient has mixed systolic and diastolic heart failure, [place] Heart Association functional class III. He is not a candidate for beta blockade in the setting of ongoing cocaine abuse. The patient seems to have very limited insight into the impact of his polysubstance abuse. He was directly counseled about the need for complete cessation, and states "I only use it about once a week." It seems clear that he is not interested in quitting. I agree with the medication recommendations as outlined above by Dr Arcelia Jew. He has minimal troponin elevation with a flat trend in the setting of CHF. There are no acute EKG changes. In my opinion he would be a very poor candidate for invasive cardiac procedures with ongoing noncompliance and substance abuse. Will update 2D echo and continue IV diuresis. His overall outlook is poor unless he makes significant lifestyle changes.  Sherren Mocha, M.D. 05/21/2014 12:04 PM

## 2014-05-22 DIAGNOSIS — I2510 Atherosclerotic heart disease of native coronary artery without angina pectoris: Secondary | ICD-10-CM

## 2014-05-22 DIAGNOSIS — D509 Iron deficiency anemia, unspecified: Secondary | ICD-10-CM

## 2014-05-22 DIAGNOSIS — I509 Heart failure, unspecified: Secondary | ICD-10-CM

## 2014-05-22 LAB — CBC
HCT: 27.3 % — ABNORMAL LOW (ref 39.0–52.0)
Hemoglobin: 8.6 g/dL — ABNORMAL LOW (ref 13.0–17.0)
MCH: 26 pg (ref 26.0–34.0)
MCHC: 31.5 g/dL (ref 30.0–36.0)
MCV: 82.5 fL (ref 78.0–100.0)
Platelets: 272 10*3/uL (ref 150–400)
RBC: 3.31 MIL/uL — ABNORMAL LOW (ref 4.22–5.81)
RDW: 18.5 % — AB (ref 11.5–15.5)
WBC: 6.4 10*3/uL (ref 4.0–10.5)

## 2014-05-22 LAB — TYPE AND SCREEN
ABO/RH(D): O POS
ANTIBODY SCREEN: NEGATIVE
Unit division: 0

## 2014-05-22 LAB — BASIC METABOLIC PANEL
ANION GAP: 8 (ref 5–15)
BUN: 20 mg/dL (ref 6–23)
CALCIUM: 8.7 mg/dL (ref 8.4–10.5)
CHLORIDE: 98 mmol/L (ref 96–112)
CO2: 29 mmol/L (ref 19–32)
CREATININE: 1.38 mg/dL — AB (ref 0.50–1.35)
GFR calc Af Amer: 58 mL/min — ABNORMAL LOW (ref >90)
GFR calc non Af Amer: 50 mL/min — ABNORMAL LOW (ref >90)
GLUCOSE: 118 mg/dL — AB (ref 70–99)
Potassium: 3.8 mmol/L (ref 3.5–5.1)
Sodium: 135 mmol/L (ref 135–145)

## 2014-05-22 LAB — HEMOGLOBIN A1C
Hgb A1c MFr Bld: 5.2 % (ref 4.8–5.6)
Mean Plasma Glucose: 103 mg/dL

## 2014-05-22 MED ORDER — POTASSIUM CHLORIDE CRYS ER 20 MEQ PO TBCR
20.0000 meq | EXTENDED_RELEASE_TABLET | Freq: Once | ORAL | Status: AC
  Administered 2014-05-22: 20 meq via ORAL

## 2014-05-22 MED ORDER — FERROUS SULFATE 325 (65 FE) MG PO TABS
325.0000 mg | ORAL_TABLET | Freq: Two times a day (BID) | ORAL | Status: DC
  Administered 2014-05-22 – 2014-05-24 (×5): 325 mg via ORAL
  Filled 2014-05-22 (×8): qty 1

## 2014-05-22 NOTE — Progress Notes (Addendum)
SUBJECTIVE:  No complaints  OBJECTIVE:   Vitals:   Filed Vitals:   05/21/14 2309 05/21/14 2332 05/22/14 0410 05/22/14 0647  BP: 145/68 147/77 143/55   Pulse: 68 64 72   Temp: 98.4 F (36.9 C) 98.6 F (37 C) 98.5 F (36.9 C)   TempSrc: Oral Oral Oral   Resp: 20 20 20    Height:      Weight:    178 lb 9.2 oz (81 kg)  SpO2: 100% 99% 98%    I&O's:   Intake/Output Summary (Last 24 hours) at 05/22/14 0703 Last data filed at 05/22/14 0442  Gross per 24 hour  Intake 1613.9 ml  Output   3100 ml  Net -1486.1 ml   TELEMETRY: Reviewed telemetry pt in NSR:     PHYSICAL EXAM General: Well developed, well nourished, in no acute distress Head: Eyes PERRLA, No xanthomas.   Normal cephalic and atramatic  Lungs:   Clear bilaterally to auscultation and percussion. Heart:   HRRR S1 S2 Pulses are 2+ & equal. Abdomen: Bowel sounds are positive, abdomen soft and non-tender without masses Extremities:   No clubbing, cyanosis or edema.  DP +1 Neuro: Alert and oriented X 3. Psych:  Good affect, responds appropriately   LABS: Basic Metabolic Panel:  Recent Labs  05/21/14 0403 05/22/14 0408  NA 133* 135  K 4.2 3.8  CL 98 98  CO2 24 29  GLUCOSE 92 118*  BUN 24* 20  CREATININE 1.36* 1.38*  CALCIUM 8.6 8.7   Liver Function Tests: No results for input(s): AST, ALT, ALKPHOS, BILITOT, PROT, ALBUMIN in the last 72 hours. No results for input(s): LIPASE, AMYLASE in the last 72 hours. CBC:  Recent Labs  05/20/14 0947 05/21/14 1307  WBC 4.5 5.5  HGB 7.6* 7.4*  HCT 25.0* 24.4*  MCV 81.7 83.0  PLT 318 279   Cardiac Enzymes:  Recent Labs  05/20/14 1026 05/20/14 1700 05/20/14 2145 05/21/14 0403  CKTOTAL 67  --   --   --   TROPONINI  --  0.08* 0.04* 0.04*   BNP: Invalid input(s): POCBNP D-Dimer: No results for input(s): DDIMER in the last 72 hours. Hemoglobin A1C:  Recent Labs  05/21/14 1051  HGBA1C 5.2   Fasting Lipid Panel: No results for input(s): CHOL, HDL,  LDLCALC, TRIG, CHOLHDL, LDLDIRECT in the last 72 hours. Thyroid Function Tests: No results for input(s): TSH, T4TOTAL, T3FREE, THYROIDAB in the last 72 hours.  Invalid input(s): FREET3 Anemia Panel: No results for input(s): VITAMINB12, FOLATE, FERRITIN, TIBC, IRON, RETICCTPCT in the last 72 hours. Coag Panel:   Lab Results  Component Value Date   INR 1.34 02/07/2014   INR 1.23 12/13/2013   INR 1.06 08/02/2013    RADIOLOGY: Dg Chest 2 View  04/26/2014   CLINICAL DATA:  Left-sided chest pain and shortness of breath since this morning. Bilateral leg swelling 3 days.  EXAM: CHEST  2 VIEW  COMPARISON:  03/01/2014 and 01/01/2014  FINDINGS: Sternotomy wires unchanged. Lungs are adequately inflated without focal consolidation. Mild prominence of the perihilar markings unchanged likely mild degree of vascular congestion. Possible small amount left pleural fluid. Moderate stable cardiomegaly. Remainder of the exam is unchanged.  IMPRESSION: Stable cardiomegaly with suggestion of minimal vascular congestion. Possible small amount left pleural fluid.   Electronically Signed   By: Marin Olp M.D.   On: 04/26/2014 09:45   Dg Chest Port 1 View  05/20/2014   CLINICAL DATA:  Cough and weakness, known heart failure  EXAM: PORTABLE CHEST - 1 VIEW  COMPARISON:  04/26/2014  FINDINGS: Cardiac shadow remains enlarged. Postsurgical changes are seen. Vascular congestion with pulmonary edema is noted consistent with CHF. No bony abnormality is noted.  IMPRESSION: Changes consistent with CHF.   Electronically Signed   By: Inez Catalina M.D.   On: 05/20/2014 10:03   Ct Angio Chest Aorta W/cm &/or Wo/cm  04/26/2014   CLINICAL DATA:  Left-sided chest and. Rule out dissection. Initial encounter. Left leg swelling. Abdominal bloating. Diabetes. Coronary artery disease.  EXAM: CT ANGIOGRAPHY CHEST, ABDOMEN AND PELVIS  TECHNIQUE: Multidetector CT imaging through the chest, abdomen and pelvis was performed using the standard  protocol during bolus administration of intravenous contrast. Multiplanar reconstructed images and MIPs were obtained and reviewed to evaluate the vascular anatomy.  CONTRAST:  149mL OMNIPAQUE IOHEXOL 350 MG/ML SOLN  COMPARISON:  Chest radiograph of 04/26/2014. Chest CT 10/23/2013. Abdominal pelvic CT of 07/16/2013.  FINDINGS: CT CHEST FINDINGS  Mediastinum/Nodes: Mild bilateral gynecomastia.  Normal caliber the great vessels, with mild atherosclerosis within. Prior median sternotomy for CABG. No evidence of thoracic aortic dissection. Ascending aorta measures 4.4 cm at the sinuses of Valsalva, 3.2 cm at the sino-tubular junction, and 3.8 cm at the mid ascending segment. Felt to be similar.  Moderate cardiomegaly, without pericardial effusion. Pulmonary artery is enlarged, with the outflow tract measuring 4.1 cm. No pulmonary embolism identified.  Mild thoracic adenopathy. Example 1.6 cm precarinal node, unchanged. This is unchanged since the prior exam. No hilar adenopathy.  Lungs/Pleura: Mild motion degradation. Poor inspiratory effort. Mild mosaic attenuation which could relate to air trapping or mild pulmonary venous congestion.  Small left greater than right pleural effusions, increased since 10/23/2013.  Musculoskeletal: No acute osseous abnormality.  CT ABDOMEN PELVIS FINDINGS  Hepatobiliary: Mildly irregular hepatic capsule, suggesting cirrhosis. Normal gallbladder and biliary tract.  Motion degradation continues into the abdomen.  Pancreas: No pancreatic duct dilatation or focal mass. There is interstitial thickening in the peripancreatic space, including on image 143.  Spleen: Normal  Adrenals/Urinary Tract: Normal adrenal glands. Interpolar left renal lesion measures 1.4 cm and greater than fluid density on image 138. This was present back to 03/24/2010, and similar in size on 07/16/2013. Normal right kidney, without hydronephrosis.  Bladder wall is thickened.  High density material within.   Stomach/Bowel: Proximal gastric underdistention. Normal colon, appendix, and terminal ileum. Normal small bowel.  Vascular/Lymphatic: Abdominal aorta is normal in caliber, with atherosclerosis within. Atherosclerotic irregularity at the origin of both renal arteries, without hemodynamically significant stenosis. The inferior mesenteric artery is patent.  The left internal iliac is significantly narrowed at its origin. No abdominopelvic adenopathy.  Reproductive: Normal prostate.  Other: Small fat containing left inguinal hernia. Small volume abdominal pelvic ascites.  Diffuse anasarca  Musculoskeletal: Degenerative disc disease at the L4-5 level. Multiple lumbosacral disc bulges. S-shaped thoracolumbar spine curvature.  Review of the MIP images confirms the above findings.  IMPRESSION: CT CHEST IMPRESSION  1. Mild motion degradation. 2. No evidence of thoracic aortic dissection. Mild ascending aortic prominence, as detailed above. Recommend annual imaging followup by CTA or MRA. This recommendation follows 2010 ACCF/AHA/AATS/ACR/ASA/SCA/SCAI/SIR/STS/SVM Guidelines for the Diagnosis and Management of Patients With Thoracic Aortic Disease. Circulation. 2010; 121: T465-K812 3. Probable congestive heart failure, with bilateral pleural effusions. 4. Pulmonary artery enlargement suggests pulmonary arterial hypertension. 5. Similar mediastinal adenopathy. This could be related to congestive heart failure. Recommend attention on follow-up.  CT ABDOMEN AND PELVIS IMPRESSION  1. Mild motion degradation. 2.  Advanced aortic atherosclerosis, without dissection or aneurysm. 3. Abdominal pelvic ascites.  Suspect mild cirrhosis. 4. Peripancreatic interstitial thickening. Cannot exclude pancreatitis. This appearance is nonspecific in the setting of ascites. 5. Pericystic edema. This could represent cystitis. Increased density within the urinary bladder could represent Early contrast excretion or hemorrhage. Consider correlation with  urinalysis.   Electronically Signed   By: Abigail Miyamoto M.D.   On: 04/26/2014 11:44   Ct Cta Abd/pel W/cm &/or W/o Cm  04/26/2014   CLINICAL DATA:  Left-sided chest and. Rule out dissection. Initial encounter. Left leg swelling. Abdominal bloating. Diabetes. Coronary artery disease.  EXAM: CT ANGIOGRAPHY CHEST, ABDOMEN AND PELVIS  TECHNIQUE: Multidetector CT imaging through the chest, abdomen and pelvis was performed using the standard protocol during bolus administration of intravenous contrast. Multiplanar reconstructed images and MIPs were obtained and reviewed to evaluate the vascular anatomy.  CONTRAST:  139mL OMNIPAQUE IOHEXOL 350 MG/ML SOLN  COMPARISON:  Chest radiograph of 04/26/2014. Chest CT 10/23/2013. Abdominal pelvic CT of 07/16/2013.  FINDINGS: CT CHEST FINDINGS  Mediastinum/Nodes: Mild bilateral gynecomastia.  Normal caliber the great vessels, with mild atherosclerosis within. Prior median sternotomy for CABG. No evidence of thoracic aortic dissection. Ascending aorta measures 4.4 cm at the sinuses of Valsalva, 3.2 cm at the sino-tubular junction, and 3.8 cm at the mid ascending segment. Felt to be similar.  Moderate cardiomegaly, without pericardial effusion. Pulmonary artery is enlarged, with the outflow tract measuring 4.1 cm. No pulmonary embolism identified.  Mild thoracic adenopathy. Example 1.6 cm precarinal node, unchanged. This is unchanged since the prior exam. No hilar adenopathy.  Lungs/Pleura: Mild motion degradation. Poor inspiratory effort. Mild mosaic attenuation which could relate to air trapping or mild pulmonary venous congestion.  Small left greater than right pleural effusions, increased since 10/23/2013.  Musculoskeletal: No acute osseous abnormality.  CT ABDOMEN PELVIS FINDINGS  Hepatobiliary: Mildly irregular hepatic capsule, suggesting cirrhosis. Normal gallbladder and biliary tract.  Motion degradation continues into the abdomen.  Pancreas: No pancreatic duct dilatation or  focal mass. There is interstitial thickening in the peripancreatic space, including on image 143.  Spleen: Normal  Adrenals/Urinary Tract: Normal adrenal glands. Interpolar left renal lesion measures 1.4 cm and greater than fluid density on image 138. This was present back to 03/24/2010, and similar in size on 07/16/2013. Normal right kidney, without hydronephrosis.  Bladder wall is thickened.  High density material within.  Stomach/Bowel: Proximal gastric underdistention. Normal colon, appendix, and terminal ileum. Normal small bowel.  Vascular/Lymphatic: Abdominal aorta is normal in caliber, with atherosclerosis within. Atherosclerotic irregularity at the origin of both renal arteries, without hemodynamically significant stenosis. The inferior mesenteric artery is patent.  The left internal iliac is significantly narrowed at its origin. No abdominopelvic adenopathy.  Reproductive: Normal prostate.  Other: Small fat containing left inguinal hernia. Small volume abdominal pelvic ascites.  Diffuse anasarca  Musculoskeletal: Degenerative disc disease at the L4-5 level. Multiple lumbosacral disc bulges. S-shaped thoracolumbar spine curvature.  Review of the MIP images confirms the above findings.  IMPRESSION: CT CHEST IMPRESSION  1. Mild motion degradation. 2. No evidence of thoracic aortic dissection. Mild ascending aortic prominence, as detailed above. Recommend annual imaging followup by CTA or MRA. This recommendation follows 2010 ACCF/AHA/AATS/ACR/ASA/SCA/SCAI/SIR/STS/SVM Guidelines for the Diagnosis and Management of Patients With Thoracic Aortic Disease. Circulation. 2010; 121: T016-W109 3. Probable congestive heart failure, with bilateral pleural effusions. 4. Pulmonary artery enlargement suggests pulmonary arterial hypertension. 5. Similar mediastinal adenopathy. This could be related to congestive heart failure. Recommend attention  on follow-up.  CT ABDOMEN AND PELVIS IMPRESSION  1. Mild motion degradation.  2. Advanced aortic atherosclerosis, without dissection or aneurysm. 3. Abdominal pelvic ascites.  Suspect mild cirrhosis. 4. Peripancreatic interstitial thickening. Cannot exclude pancreatitis. This appearance is nonspecific in the setting of ascites. 5. Pericystic edema. This could represent cystitis. Increased density within the urinary bladder could represent Early contrast excretion or hemorrhage. Consider correlation with urinalysis.   Electronically Signed   By: Abigail Miyamoto M.D.   On: 04/26/2014 11:44    ASSESSMENT AND PLAN:   1.  Mild CHF Exacerbation: Patient presented with left-sided chest pain and leg swelling found to have minimally elevated troponins, BNP 2250, and UDS positive for cocaine. He does not appear to be in an overt CHF exacerbation as he has no JVD on exam, minimal rales, and trace-1+ pitting edema in his bilateral lower extremities on exam. His mildly elevated troponins are likely due to demand ischemia. EKG with no changes from previous. He likely has chronic chest pain in the setting of his heart failure and continued cocaine abuse. He has received IV Lasix 100 mg yesterday and now on IV Lasix 40 mg BID today, seems to have responded well. Renal function stable after diuresing 3L.  He is net 1.8L neg but no significant change in weight.   - Will repeat echocardiogram to assess LV function - Continue Lasix 40 mg BID IV - Continue medical therapy with hydralazine and nitrates, avoid beta blockers given cocaine use  - Best option would be for patient to stop using cocaine, but does not seem agreeable to this plan   2.  Ischemic DCM EF 40-45%.   3.  Polysubstance abuse - UDS + for cocaine  4.  HTN - controlled on hydralazine/amlodipine/clonidine  5.  CAD s/p CABG 2009 with large fixed defect inferiorly on  myoview 11/2013 in [place].  Continue ASA/statin.  6.  CKD stage III - creatinine stable with diuresis  7.  Markedly anemic with history of iron deficiency  - w/u per IM.   This is probable exacerbating his CHF.  Consider transfusion given history of CAD and CHF.      Sueanne Margarita, MD  05/22/2014  7:03 AM

## 2014-05-22 NOTE — Progress Notes (Signed)
[facility] PROGRESS NOTE  Braxston Quinter PYP:[id number] DOB: 05/05/1945 DOA: 05/20/2014 PCP: [name]  Assessment/Plan: 1-Acute on chronic systolic/diastolic congestive heart failure. Weight on 2-11 was at 75 Kg.  LVEF 40-45%. Continue with lasix 40 Mg IV BID.  ECHO ordered.  Weight 81---79--81 Negative 2 L.   Anemia; S/P one unit PRBC. Hb at 8.  Lower extremity pain and edema. Will check doppler.   Chest pain;  Chest pain better. Mildly elevated troponin.  Morphine IV, Nitroglycerin PRN.  Cardiology following.  Continue with Imdur, statin, aspirin.   Seizure; continue with dilantin.  HTN; continue with Norvasc, clonidine.   Acute hyponatremia; in setting heart failure. Improving.   HB A-1c 5.2. Was not on medication.   Polysubstance abuse including cocaine with UDS positive for cocaine on admission Counseling provide.   Code Status: Full Code.  Family Communication: Care discussed with pateint Disposition Plan: Remain inpatient.    Consultants:  Cardiology  Procedures:  ECHO; pending.   Antibiotics:  none  HPI/Subjective: Chest pain better. Complaining of LE pain.   Objective: Filed Vitals:   05/22/14 1334  BP: 143/64  Pulse: 61  Temp: 98 F (36.7 C)  Resp: 20    Intake/Output Summary (Last 24 hours) at 05/22/14 1337 Last data filed at 05/22/14 1300  Gross per 24 hour  Intake 1613.9 ml  Output   3180 ml  Net -1566.1 ml   Filed Weights   05/20/14 1405 05/21/14 0700 05/22/14 0647  Weight: 81.1 kg (178 lb 12.7 oz) 79.5 kg (175 lb 4.3 oz) 81 kg (178 lb 9.2 oz)    Exam:   General:  Sitting in the chair in no distress.   Cardiovascular: S 1, S 2 RRR  Respiratory: Bilateral crackles, ronchus  Abdomen: BS present, soft, nt  Musculoskeletal: plus 2 edema.    Data Reviewed: Basic Metabolic Panel:  Recent Labs Lab 05/20/14 0947 05/21/14 0403 05/22/14 0408  NA 130* 133* 135  K 4.2 4.2 3.8  CL 100 98 98  CO2 25 24  29   GLUCOSE 79 92 118*  BUN 22 24* 20  CREATININE 1.30 1.36* 1.38*  CALCIUM 8.6 8.6 8.7   Liver Function Tests: No results for input(s): AST, ALT, ALKPHOS, BILITOT, PROT, ALBUMIN in the last 168 hours. No results for input(s): LIPASE, AMYLASE in the last 168 hours. No results for input(s): AMMONIA in the last 168 hours. CBC:  Recent Labs Lab 05/20/14 0947 05/21/14 1307 05/22/14 0915  WBC 4.5 5.5 6.4  HGB 7.6* 7.4* 8.6*  HCT 25.0* 24.4* 27.3*  MCV 81.7 83.0 82.5  PLT 318 279 272   Cardiac Enzymes:  Recent Labs Lab 05/20/14 1026 05/20/14 1700 05/20/14 2145 05/21/14 0403  CKTOTAL 67  --   --   --   TROPONINI  --  0.08* 0.04* 0.04*   BNP (last 3 results)  Recent Labs  04/26/14 0904 05/20/14 0947  BNP 2313.3* 2249.6*    ProBNP (last 3 results)  Recent Labs  01/01/14 1107 02/07/14 0123 03/01/14 2250  PROBNP 3225.0* 2648.0* 7421.0*    CBG:  Recent Labs Lab 05/20/14 1616 05/20/14 2126 05/21/14 0622  GLUCAP 172* 122* 94    No results found for this or any previous visit (from the past 240 hour(s)).   Studies: No results found.  Scheduled Meds: . amLODipine  10 mg Oral Daily  . aspirin EC  81 mg Oral Daily  . Chlorhexidine Gluconate Cloth  6 each Topical Q0600  . citalopram  20 mg Oral Daily  . cloNIDine  0.1 mg Oral BID  . furosemide  40 mg Intravenous Q12H  . gabapentin  100 mg Oral BID  . hydrALAZINE  75 mg Oral TID  . isosorbide mononitrate  30 mg Oral Daily  . mupirocin ointment  1 application Nasal BID  . pantoprazole  40 mg Oral Daily  . phenytoin  200 mg Oral Daily  . phenytoin  300 mg Oral QHS  . pravastatin  40 mg Oral q1800  . sodium chloride  3 mL Intravenous Q12H   Continuous Infusions:   Principal Problem:   Acute on chronic combined systolic and diastolic heart failure Active Problems:   Iron deficiency anemia   CAD- CABG x3 '09. Myoview no ischemia 11/27/13   Chronic kidney disease (CKD), stage III (moderate)   Moderate  to severe pulmonary hypertension   Polysubstance abuse   Chest pain   Cocaine abuse   Hyponatremia   Acute on chronic combined systolic and diastolic CHF (congestive heart failure)    Time spent: 35 minutes.     Niel Hummer A  [facility] Pager [phone]. If 7PM-7AM, please contact night-coverage at [email or web address], password [facility] 05/22/2014, 1:37 PM  LOS: 2 days

## 2014-05-22 NOTE — Progress Notes (Signed)
Pt sitting up in chair, denies needs at this time, sitter in room

## 2014-05-22 NOTE — Progress Notes (Signed)
Medicare Important Message given? YES (If response is "NO", the following Medicare IM given date fields will be blank) Date Medicare IM given:05/22/2014 Medicare IM given by: Anayeli Arel 

## 2014-05-22 NOTE — Progress Notes (Signed)
  Echocardiogram 2D Echocardiogram has been performed.  Charles [name] [name] 05/22/2014, 4:28 PM

## 2014-05-23 DIAGNOSIS — M79604 Pain in right leg: Secondary | ICD-10-CM

## 2014-05-23 DIAGNOSIS — M79605 Pain in left leg: Secondary | ICD-10-CM

## 2014-05-23 LAB — BASIC METABOLIC PANEL
Anion gap: 6 (ref 5–15)
BUN: 18 mg/dL (ref 6–23)
CHLORIDE: 97 mmol/L (ref 96–112)
CO2: 31 mmol/L (ref 19–32)
Calcium: 8.5 mg/dL (ref 8.4–10.5)
Creatinine, Ser: 1.13 mg/dL (ref 0.50–1.35)
GFR calc non Af Amer: 64 mL/min — ABNORMAL LOW (ref >90)
GFR, EST AFRICAN AMERICAN: 74 mL/min — AB (ref >90)
GLUCOSE: 100 mg/dL — AB (ref 70–99)
POTASSIUM: 3.8 mmol/L (ref 3.5–5.1)
Sodium: 134 mmol/L — ABNORMAL LOW (ref 135–145)

## 2014-05-23 LAB — CBC
HCT: 26.9 % — ABNORMAL LOW (ref 39.0–52.0)
HEMOGLOBIN: 8.3 g/dL — AB (ref 13.0–17.0)
MCH: 25.2 pg — ABNORMAL LOW (ref 26.0–34.0)
MCHC: 30.9 g/dL (ref 30.0–36.0)
MCV: 81.8 fL (ref 78.0–100.0)
PLATELETS: 291 10*3/uL (ref 150–400)
RBC: 3.29 MIL/uL — ABNORMAL LOW (ref 4.22–5.81)
RDW: 18.6 % — ABNORMAL HIGH (ref 11.5–15.5)
WBC: 6.8 10*3/uL (ref 4.0–10.5)

## 2014-05-23 LAB — SEDIMENTATION RATE: Sed Rate: 25 mm/hr — ABNORMAL HIGH (ref 0–16)

## 2014-05-23 MED ORDER — FUROSEMIDE 40 MG PO TABS
40.0000 mg | ORAL_TABLET | Freq: Two times a day (BID) | ORAL | Status: DC
  Administered 2014-05-23 – 2014-05-24 (×3): 40 mg via ORAL
  Filled 2014-05-23 (×7): qty 1

## 2014-05-23 NOTE — Progress Notes (Signed)
Pt does not want to wear our CPAP. Will wear when he receives his own machine.

## 2014-05-23 NOTE — Progress Notes (Signed)
Patient Name: Charles [name]      SUBJECTIVE: 69 yo M with ICM prior CABG EF 40-45% 9/15 MOD-SEV pulm HTN OSA admitted with chest pain and edema  BNP elevated again and ongoing polysubstance abuse Hx of anemia on iron therapy  Complaints of weakenss and sluggishness and pain in his legs   Past Medical History  Diagnosis Date  . Iron deficiency anemia   . Chronic combined systolic and diastolic CHF, NYHA class 2     a. 11/2013 Echo: EF 40-45%, basl-mid inflat AK, Gr 2 DD.  [name] Hyperlipidemia LDL goal <70   . Obstructive sleep apnea     a. not on home CPAP  . Ischemic cardiomyopathy     a. 11/2013 Echo: EF 40-45%, basl-mid inflat AK, Gr 2 DD, mild AI, mod dil LA/RA, mod reduced RV fxn, PASP 17mHg.  .Marland KitchenHomelessness   . Moderate to severe pulmonary hypertension 03/2010    a. PA peak pressure of 76 mmHg (per 2D Echo 03/2010)  . Polysubstance abuse     a. cocaine, THC  . AV malformation of gastrointestinal tract     a. w/ h/o GIB.  .Marland KitchenFamily history of early CAD   . DDD (degenerative disc disease), lumbar   . Peripheral vascular disease   . Seizure disorder   . Hypertension   . CAD (coronary artery disease)     a. s/p 3-vessel CABG (12/2007) // 100% RCA stenosis with collaterals from left system. Severe bifurcation lesions of proxima CXA and OM. Moderate LAD disease - [facility]  b. 11/2013 Myoview: Large fixed inf defect w/o ischemia, EF 35%.  . Diabetes   . Chronic kidney disease (CKD), stage III (moderate)     a. BL SCr 1.5-1.6  . Renal artery stenosis   . Seizures     Scheduled Meds:  Scheduled Meds: . amLODipine  10 mg Oral Daily  . aspirin EC  81 mg Oral Daily  . Chlorhexidine Gluconate Cloth  6 each Topical Q0600  . citalopram  20 mg Oral Daily  . cloNIDine  0.1 mg Oral BID  . ferrous sulfate  325 mg Oral BID WC  . furosemide  40 mg Intravenous Q12H  . gabapentin  100 mg Oral BID  . hydrALAZINE  75 mg Oral TID  . isosorbide  mononitrate  30 mg Oral Daily  . mupirocin ointment  1 application Nasal BID  . pantoprazole  40 mg Oral Daily  . phenytoin  200 mg Oral Daily  . phenytoin  300 mg Oral QHS  . pravastatin  40 mg Oral q1800  . sodium chloride  3 mL Intravenous Q12H   Continuous Infusions:  sodium chloride, albuterol, nitroGLYCERIN, oxyCODONE-acetaminophen, sodium chloride    PHYSICAL EXAM Filed Vitals:   05/22/14 1334 05/22/14 1939 05/23/14 0455 05/23/14 0500  BP: 143/64 143/60 144/64   Pulse: 61 71 67   Temp: 98 F (36.7 C) 98.2 F (36.8 C) 98.6 F (37 C)   TempSrc: Oral Oral Oral   Resp: 20 18 18    Height:      Weight:    170 lb 13.7 oz (77.5 kg)  SpO2: 96% 99% 98%     General appearance: alert, cooperative and no distress Lungs: clear to auscultation bilaterally Heart: regular rate and rhythm and systolic murmur: early systolic 2/6, crescendo and decrescendo at 2nd right intercostal space Abdomen: soft, non-tender; bowel sounds normal; no masses,  no  organomegaly Extremities: edema 1+ Skin: Skin color, texture, turgor normal. No rashes or lesions Neurologic: Grossly normal but complains of weakness with walking  TELEMETRY: Reviewed telemetry pt in  Sinus with occ PVCs:    Intake/Output Summary (Last 24 hours) at 05/23/14 1347 Last data filed at 05/23/14 1000  Gross per 24 hour  Intake    240 ml  Output   2100 ml  Net  -1860 ml    LABS: Basic Metabolic Panel:  Recent Labs Lab 05/20/14 0947 05/21/14 0403 05/22/14 0408 05/23/14 0350  NA 130* 133* 135 134*  K 4.2 4.2 3.8 3.8  CL 100 98 98 97  CO2 25 24 29 31   GLUCOSE 79 92 118* 100*  BUN 22 24* 20 18  CREATININE 1.30 1.36* 1.38* 1.13  CALCIUM 8.6 8.6 8.7 8.5   Cardiac Enzymes:  Recent Labs  05/20/14 1700 05/20/14 2145 05/21/14 0403  TROPONINI 0.08* 0.04* 0.04*   CBC:  Recent Labs Lab 05/20/14 0947 05/21/14 1307 05/22/14 0915 05/23/14 0350  WBC 4.5 5.5 6.4 6.8  HGB 7.6* 7.4* 8.6* 8.3*  HCT 25.0* 24.4*  27.3* 26.9*  MCV 81.7 83.0 82.5 81.8  PLT 318 279 272 291   PROTIME: No results for input(s): LABPROT, INR in the last 72 hours. Liver Function Tests: No results for input(s): AST, ALT, ALKPHOS, BILITOT, PROT, ALBUMIN in the last 72 hours. No results for input(s): LIPASE, AMYLASE in the last 72 hours. BNP: BNP (last 3 results)  Recent Labs  04/26/14 0904 05/20/14 0947  BNP 2313.3* 2249.6*    ProBNP (last 3 results)  Recent Labs  01/01/14 1107 02/07/14 0123 03/01/14 2250  PROBNP 3225.0* 2648.0* 7421.0*    D-Dimer: No results for input(s): DDIMER in the last 72 hours. Hemoglobin A1C:  Recent Labs  05/21/14 1051  HGBA1C 5.2   Fasting Lipid Panel: No results for input(s): CHOL, HDL, LDLCALC, TRIG, CHOLHDL, LDLDIRECT in the last 72 hours. Thyroid Function Tests:   ASSESSMENT AND PLAN:  Principal Problem:   Acute on chronic combined systolic and diastolic heart failure Active Problems:   Iron deficiency anemia   CAD- CABG x3 '09. Myoview no ischemia 11/27/13   Chronic kidney disease (CKD), stage III (moderate)   Moderate to severe pulmonary hypertension   Polysubstance abuse   Chest pain   Cocaine abuse   Hyponatremia   Acute on chronic combined systolic and diastolic CHF (congestive heart failure)  Will back of diuresis  cahnge to PO Will nto hold statin for leg pain, as acute Check orthostatic VS Will get PT to evaluate Check ESR doubt PMR   Signed, Virl Axe MD  05/23/2014

## 2014-05-23 NOTE — Progress Notes (Signed)
VASCULAR LAB PRELIMINARY  PRELIMINARY  PRELIMINARY  PRELIMINARY  Bilateral lower extremity venous Dopplers completed.    Preliminary report:  There is no DVT or SVT noted in the bilateral lower extremities.   Luverta Korte, RVT 05/23/2014, 11:53 AM

## 2014-05-23 NOTE — Progress Notes (Signed)
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 Weight on 2-11 was at 75 Kg prior to discharge.[name]  ECHO; LVEF 45%. , diastolic dysfunction. diffuse hypokinesis.  Continue with lasix 40 Mg IV BID.  ECHO ordered.  Weight 81---79--81---77 Negative 3 L.   Anemia; S/P one unit PRBC. Hb at 8.  Lower extremity pain and edema.  Doppler negative for DVT  Chest pain;  Chest pain better. Mildly elevated troponin.  Morphine IV, Nitroglycerin PRN.  Cardiology following.  Continue with Imdur, statin, aspirin.   Seizure; continue with dilantin.  HTN; continue with Norvasc, clonidine.   Acute hyponatremia; in setting heart failure. Improving.   HB A-1c 5.2. Was not on medication.   Polysubstance abuse including cocaine with UDS positive for cocaine on admission Counseling provide.   Code Status: Full Code.  Family Communication: Care discussed with pateint Disposition Plan: Remain inpatient.    Consultants:  Cardiology  Procedures:  ECHO; Ef 45 %  Antibiotics:  none  HPI/Subjective: Still with LE pain. No more chest pain.   Objective: Filed Vitals:   05/23/14 0455  BP: 144/64  Pulse: 67  Temp: 98.6 F (37 C)  Resp: 18    Intake/Output Summary (Last 24 hours) at 05/23/14 1353 Last data filed at 05/23/14 1000  Gross per 24 hour  Intake    240 ml  Output   2100 ml  Net  -1860 ml   Filed Weights   05/21/14 0700 05/22/14 0647 05/23/14 0500  Weight: 79.5 kg (175 lb 4.3 oz) 81 kg (178 lb 9.2 oz) 77.5 kg (170 lb 13.7 oz)    Exam:   General:  Sitting in the chair in no distress.   Cardiovascular: S 1, S 2 RRR  Respiratory: Bilateral crackles, ronchus  Abdomen: BS present, soft, nt  Musculoskeletal: plus 2 edema.    Data Reviewed: Basic Metabolic Panel:  Recent Labs Lab 05/20/14 0947 05/21/14 0403  05/22/14 0408 05/23/14 0350  NA 130* 133* 135 134*  K 4.2 4.2 3.8 3.8  CL 100 98 98 97  CO2 25 24 29 31   GLUCOSE 79 92 118* 100*  BUN 22 24* 20 18  CREATININE 1.30 1.36* 1.38* 1.13  CALCIUM 8.6 8.6 8.7 8.5   Liver Function Tests: No results for input(s): AST, ALT, ALKPHOS, BILITOT, PROT, ALBUMIN in the last 168 hours. No results for input(s): LIPASE, AMYLASE in the last 168 hours. No results for input(s): AMMONIA in the last 168 hours. CBC:  Recent Labs Lab 05/20/14 0947 05/21/14 1307 05/22/14 0915 05/23/14 0350  WBC 4.5 5.5 6.4 6.8  HGB 7.6* 7.4* 8.6* 8.3*  HCT 25.0* 24.4* 27.3* 26.9*  MCV 81.7 83.0 82.5 81.8  PLT 318 279 272 291   Cardiac Enzymes:  Recent Labs Lab 05/20/14 1026 05/20/14 1700 05/20/14 2145 05/21/14 0403  CKTOTAL 67  --   --   --   TROPONINI  --  0.08* 0.04* 0.04*   BNP (last 3 results)  Recent Labs  04/26/14 0904 05/20/14 0947  BNP 2313.3* 2249.6*    ProBNP (last 3 results)  Recent Labs  01/01/14 1107 02/07/14 0123 03/01/14 2250  PROBNP 3225.0* 2648.0* 7421.0*    CBG:  Recent Labs Lab 05/20/14 1616 05/20/14 2126 05/21/14 0622  GLUCAP 172* 122* 94    No results found for this or any previous visit (from the past 240 hour(s)).   Studies: No results  found.  Scheduled Meds: . amLODipine  10 mg Oral Daily  . aspirin EC  81 mg Oral Daily  . Chlorhexidine Gluconate Cloth  6 each Topical Q0600  . citalopram  20 mg Oral Daily  . cloNIDine  0.1 mg Oral BID  . ferrous sulfate  325 mg Oral BID WC  . furosemide  40 mg Intravenous Q12H  . gabapentin  100 mg Oral BID  . hydrALAZINE  75 mg Oral TID  . isosorbide mononitrate  30 mg Oral Daily  . mupirocin ointment  1 application Nasal BID  . pantoprazole  40 mg Oral Daily  . phenytoin  200 mg Oral Daily  . phenytoin  300 mg Oral QHS  . pravastatin  40 mg Oral q1800  . sodium chloride  3 mL Intravenous Q12H   Continuous Infusions:   Principal Problem:   Acute on chronic  combined systolic and diastolic heart failure Active Problems:   Iron deficiency anemia   CAD- CABG x3 '09. Myoview no ischemia 11/27/13   Chronic kidney disease (CKD), stage III (moderate)   Moderate to severe pulmonary hypertension   Polysubstance abuse   Chest pain   Cocaine abuse   Hyponatremia   Acute on chronic combined systolic and diastolic CHF (congestive heart failure)    Time spent: 35 minutes.     Niel Hummer A  [facility] Pager [phone]. If 7PM-7AM, please contact night-coverage at [email or web address], password [facility] 05/23/2014, 1:53 PM  LOS: 3 days

## 2014-05-23 NOTE — Progress Notes (Signed)
Patient stated that he could not sleep with the CPAP here. He is supposed to get his CPAP machine on Thursday coming up.

## 2014-05-24 LAB — CK: Total CK: 41 U/L (ref 7–232)

## 2014-05-24 LAB — PHENYTOIN LEVEL, TOTAL: PHENYTOIN LVL: 11.3 ug/mL (ref 10.0–20.0)

## 2014-05-24 MED ORDER — HYDROCODONE-ACETAMINOPHEN 5-325 MG PO TABS
1.0000 | ORAL_TABLET | Freq: Four times a day (QID) | ORAL | Status: DC | PRN
  Administered 2014-05-24: 1 via ORAL
  Filled 2014-05-24: qty 1

## 2014-05-24 NOTE — Progress Notes (Signed)
Patient Name: Charles [name]      SUBJECTIVE: 69 yo M with ICM prior CABG EF 40-45% 9/15 MOD-SEV pulm HTN OSA admitted with chest pain and edema  BNP elevated again and ongoing polysubstance abuse Hx of anemia on iron therapy  Complaints of weakenss and sluggishness and pain in his legs  + bakers cyst  ESR only 25 Diuretics changed yday IV>>PO wtihout complaints of SOB  X brief paroxysms   Past Medical History  Diagnosis Date  . Iron deficiency anemia   . Chronic combined systolic and diastolic CHF, NYHA class 2     a. 11/2013 Echo: EF 40-45%, basl-mid inflat AK, Gr 2 DD.  [name] Hyperlipidemia LDL goal <70   . Obstructive sleep apnea     a. not on home CPAP  . Ischemic cardiomyopathy     a. 11/2013 Echo: EF 40-45%, basl-mid inflat AK, Gr 2 DD, mild AI, mod dil LA/RA, mod reduced RV fxn, PASP 73mHg.  .Marland KitchenHomelessness   . Moderate to severe pulmonary hypertension 03/2010    a. PA peak pressure of 76 mmHg (per 2D Echo 03/2010)  . Polysubstance abuse     a. cocaine, THC  . AV malformation of gastrointestinal tract     a. w/ h/o GIB.  .Marland KitchenFamily history of early CAD   . DDD (degenerative disc disease), lumbar   . Peripheral vascular disease   . Seizure disorder   . Hypertension   . CAD (coronary artery disease)     a. s/p 3-vessel CABG (12/2007) // 100% RCA stenosis with collaterals from left system. Severe bifurcation lesions of proxima CXA and OM. Moderate LAD disease - [facility]  b. 11/2013 Myoview: Large fixed inf defect w/o ischemia, EF 35%.  . Diabetes   . Chronic kidney disease (CKD), stage III (moderate)     a. BL SCr 1.5-1.6  . Renal artery stenosis   . Seizures     Scheduled Meds:  Scheduled Meds: . amLODipine  10 mg Oral Daily  . aspirin EC  81 mg Oral Daily  . Chlorhexidine Gluconate Cloth  6 each Topical Q0600  . citalopram  20 mg Oral Daily  . cloNIDine  0.1 mg Oral BID  . ferrous sulfate  325 mg Oral BID WC  . furosemide   40 mg Oral BID  . gabapentin  100 mg Oral BID  . hydrALAZINE  75 mg Oral TID  . isosorbide mononitrate  30 mg Oral Daily  . mupirocin ointment  1 application Nasal BID  . pantoprazole  40 mg Oral Daily  . phenytoin  200 mg Oral Daily  . phenytoin  300 mg Oral QHS  . sodium chloride  3 mL Intravenous Q12H   Continuous Infusions:  sodium chloride, albuterol, nitroGLYCERIN, oxyCODONE-acetaminophen, sodium chloride    PHYSICAL EXAM Filed Vitals:   05/23/14 0500 05/23/14 1507 05/23/14 1933 05/24/14 0300  BP:  148/58 130/66 145/74  Pulse:  65 79 69  Temp:  98.9 F (37.2 C) 98.5 F (36.9 C) 98.4 F (36.9 C)  TempSrc:  Oral Oral   Resp:  20 20 18   Height:      Weight: 170 lb 13.7 oz (77.5 kg)   171 lb 15.3 oz (78 kg)  SpO2:  100% 100% 100%    General appearance: alert, cooperative and no distress Lungs: clear to auscultation bilaterally Heart: regular rate and rhythm and systolic murmur: early systolic 2/6, crescendo  and decrescendo at 2nd right intercostal space Abdomen: soft, non-tender; bowel sounds normal; no masses,  no organomegaly Extremities: edema 1+ Skin: Skin color, texture, turgor normal. No rashes or lesions Neurologic: Grossly normal but complains of weakness with walking  TELEMETRY: Reviewed telemetry pt in  Sinus with occ PVCs:    Intake/Output Summary (Last 24 hours) at 05/24/14 1240 Last data filed at 05/24/14 0500  Gross per 24 hour  Intake    240 ml  Output    720 ml  Net   -480 ml    LABS: Basic Metabolic Panel:  Recent Labs Lab 05/20/14 0947 05/21/14 0403 05/22/14 0408 05/23/14 0350  NA 130* 133* 135 134*  K 4.2 4.2 3.8 3.8  CL 100 98 98 97  CO2 25 24 29 31   GLUCOSE 79 92 118* 100*  BUN 22 24* 20 18  CREATININE 1.30 1.36* 1.38* 1.13  CALCIUM 8.6 8.6 8.7 8.5   Cardiac Enzymes:  Recent Labs  05/24/14 0915  CKTOTAL 41   CBC:  Recent Labs Lab 05/20/14 0947 05/21/14 1307 05/22/14 0915 05/23/14 0350  WBC 4.5 5.5 6.4 6.8  HGB  7.6* 7.4* 8.6* 8.3*  HCT 25.0* 24.4* 27.3* 26.9*  MCV 81.7 83.0 82.5 81.8  PLT 318 279 272 291   PROTIME: No results for input(s): LABPROT, INR in the last 72 hours. Liver Function Tests: No results for input(s): AST, ALT, ALKPHOS, BILITOT, PROT, ALBUMIN in the last 72 hours. No results for input(s): LIPASE, AMYLASE in the last 72 hours. BNP: BNP (last 3 results)  Recent Labs  04/26/14 0904 05/20/14 0947  BNP 2313.3* 2249.6*    ProBNP (last 3 results)  Recent Labs  01/01/14 1107 02/07/14 0123 03/01/14 2250  PROBNP 3225.0* 2648.0* 7421.0*    D-Dimer: No results for input(s): DDIMER in the last 72 hours. Hemoglobin A1C: No results for input(s): HGBA1C in the last 72 hours. Fasting Lipid Panel: No results for input(s): CHOL, HDL, LDLCALC, TRIG, CHOLHDL, LDLDIRECT in the last 72 hours. Thyroid Function Tests:   ASSESSMENT AND PLAN:  Principal Problem:   Acute on chronic combined systolic and diastolic heart failure Active Problems:   Iron deficiency anemia   CAD- CABG x3 '09. Myoview no ischemia 11/27/13   Chronic kidney disease (CKD), stage III (moderate)   Moderate to severe pulmonary hypertension   Polysubstance abuse   Chest pain   Cocaine abuse   Hyponatremia   Acute on chronic combined systolic and diastolic CHF (congestive heart failure)  Euvolemic  Continue current dose of diuretics  Symptomatic PVCs, likely  The explanation of the breathing issues.  Orthostatics not revealing   Signed, Virl Axe MD  05/24/2014

## 2014-05-24 NOTE — Progress Notes (Signed)
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 Weight on 2-11 was at 75 Kg prior to discharge.[name]  ECHO; LVEF 45%. , diastolic dysfunction. diffuse hypokinesis.  Continue with lasix 40 Mg PO BID.  Weight 81---79--81---77 Negative 4 L.   Anemia; S/P one unit PRBC. Hb at 8.  Lower extremity pain and edema.  Doppler negative for DVT, positive baker cyst,   Chest pain;  Chest pain better. Mildly elevated troponin.  Nitroglycerin PRN.  Cardiology following.  Continue with Imdur, statin, aspirin.   Seizure; continue with dilantin.  HTN; continue with Norvasc, clonidine.   Acute hyponatremia; in setting heart failure. Improving.   HB A-1c 5.2. Was not on medication.   Polysubstance abuse including cocaine with UDS positive for cocaine on admission Counseling provide.   Code Status: Full Code.  Family Communication: Care discussed with pateint Disposition Plan: Remain inpatient.    Consultants:  Cardiology  Procedures:  ECHO; Ef 45 %  Antibiotics:  none  HPI/Subjective: Still with LE pain. No more chest pain.  SOB at times.  Feeling tired.   Objective: Filed Vitals:   05/24/14 0300  BP: 145/74  Pulse: 69  Temp: 98.4 F (36.9 C)  Resp: 18    Intake/Output Summary (Last 24 hours) at 05/24/14 1413 Last data filed at 05/24/14 0500  Gross per 24 hour  Intake    240 ml  Output    720 ml  Net   -480 ml   Filed Weights   05/22/14 0647 05/23/14 0500 05/24/14 0300  Weight: 81 kg (178 lb 9.2 oz) 77.5 kg (170 lb 13.7 oz) 78 kg (171 lb 15.3 oz)    Exam:   General:  Sitting in the chair in no distress.   Cardiovascular: S 1, S 2 RRR  Respiratory: CTA  Abdomen: BS present, soft, nt  Musculoskeletal: plus 2 edema.    Data Reviewed: Basic Metabolic Panel:  Recent Labs Lab 05/20/14 0947  05/21/14 0403 05/22/14 0408 05/23/14 0350  NA 130* 133* 135 134*  K 4.2 4.2 3.8 3.8  CL 100 98 98 97  CO2 25 24 29 31   GLUCOSE 79 92 118* 100*  BUN 22 24* 20 18  CREATININE 1.30 1.36* 1.38* 1.13  CALCIUM 8.6 8.6 8.7 8.5   Liver Function Tests: No results for input(s): AST, ALT, ALKPHOS, BILITOT, PROT, ALBUMIN in the last 168 hours. No results for input(s): LIPASE, AMYLASE in the last 168 hours. No results for input(s): AMMONIA in the last 168 hours. CBC:  Recent Labs Lab 05/20/14 0947 05/21/14 1307 05/22/14 0915 05/23/14 0350  WBC 4.5 5.5 6.4 6.8  HGB 7.6* 7.4* 8.6* 8.3*  HCT 25.0* 24.4* 27.3* 26.9*  MCV 81.7 83.0 82.5 81.8  PLT 318 279 272 291   Cardiac Enzymes:  Recent Labs Lab 05/20/14 1026 05/20/14 1700 05/20/14 2145 05/21/14 0403 05/24/14 0915  CKTOTAL 67  --   --   --  41  TROPONINI  --  0.08* 0.04* 0.04*  --    BNP (last 3 results)  Recent Labs  04/26/14 0904 05/20/14 0947  BNP 2313.3* 2249.6*    ProBNP (last 3 results)  Recent Labs  01/01/14 1107 02/07/14 0123 03/01/14 2250  PROBNP 3225.0* 2648.0* 7421.0*    CBG:  Recent Labs Lab 05/20/14 1616 05/20/14 2126 05/21/14 0622  GLUCAP 172* 122* 94    No results found for this or any  previous visit (from the past 240 hour(s)).   Studies: No results found.  Scheduled Meds: . amLODipine  10 mg Oral Daily  . aspirin EC  81 mg Oral Daily  . Chlorhexidine Gluconate Cloth  6 each Topical Q0600  . citalopram  20 mg Oral Daily  . cloNIDine  0.1 mg Oral BID  . ferrous sulfate  325 mg Oral BID WC  . furosemide  40 mg Oral BID  . gabapentin  100 mg Oral BID  . hydrALAZINE  75 mg Oral TID  . isosorbide mononitrate  30 mg Oral Daily  . mupirocin ointment  1 application Nasal BID  . pantoprazole  40 mg Oral Daily  . phenytoin  200 mg Oral Daily  . phenytoin  300 mg Oral QHS  . sodium chloride  3 mL Intravenous Q12H   Continuous Infusions:   Principal Problem:   Acute on chronic  combined systolic and diastolic heart failure Active Problems:   Iron deficiency anemia   CAD- CABG x3 '09. Myoview no ischemia 11/27/13   Chronic kidney disease (CKD), stage III (moderate)   Moderate to severe pulmonary hypertension   Polysubstance abuse   Chest pain   Cocaine abuse   Hyponatremia   Acute on chronic combined systolic and diastolic CHF (congestive heart failure)    Time spent: 35 minutes.     Niel Hummer A  [facility] Pager [phone]. If 7PM-7AM, please contact night-coverage at [email or web address], password [facility] 05/24/2014, 2:13 PM  LOS: 4 days

## 2014-05-24 NOTE — Clinical Social Work Psychosocial (Signed)
Clinical Social Work Department BRIEF PSYCHOSOCIAL ASSESSMENT 05/24/2014  Patient:  EASTER, SCHINKE     Account Number:  [id number]     Admit date:  05/20/2014  Clinical Social Worker:  Elam Dutch  Date/Time:  05/24/2014 04:26 PM  Referred by:  Physician  Date Referred:  05/24/2014 Referred for  Substance Abuse   Other Referral:   Interview type:  Patient Other interview type:    PSYCHOSOCIAL DATA Living Status:  OTHER Admitted from facility:   Level of care:   Primary support name:  None listed by patient Primary support relationship to patient:   Degree of support available:   No support    CURRENT CONCERNS Current Concerns  Substance Abuse  Other - See comment   Other Concerns:   Difficult living environment    SOCIAL WORK ASSESSMENT / PLAN 69 year old male- well known to this CSW from past mulitple hospitalizations due to substance abuse and heart failure. Patient normally tests positive for cocaine with most visits and occasionally positive for optiates.  He admits to past use but will minimalize the usage of the cocaine. Patient is able to verbalize a strong understanding of the damanges to his body due to the drug use as well as the options availble to help quit.  He states that he rarely usues anymore and only in small quantities.  Patient lives in a very unsafe environment- he is quite protective over this situation except to say that he wants to leave the home he is currently living in. Patinent has reported in the past that several women live with him and has related to past staff that they use the house for drugs and prostitition.  (Patient denies this to this SW but he has made multiple efforts in the past to obtain housing in [place].)  He is a past felon and has a low income (abour $800.00 per month)- he cannot afford his own apartment and adamantly refuses to consider living with anyone (a roommate or boarding house) as he wants to get away  from "crazy people".  In the past- CSW has discussed with patient the possibility of entering an asssisted living but he adamantly refuses this as he does not want to be confined and have to follow the facility's rules etc. He admits that he wants to stop using cocaine but he still wants the freedom to be allowed to do so if he chose to do so.  Patient denies having any family or friends to support him;  at d/c he does not have anyway to get home to [facility].  He does not qualify for Medicaid and thus unable to utilize this mode of transport.  He does not need an ambulance as he is able to ambulate.  Attempts in the past to send him by bus have not been effective as he is unable to ambulate from the bus station to his home due to his age and HF.   Assessment/plan status:  Psychosocial Support/Ongoing Assessment of Needs Other assessment/ plan:   Information/referral to community resources:   None at this time.    PATIENTS/FAMILYS RESPONSE TO PLAN OF CARE: Patient is alert, oriented and is pleasant when CSW visits. (In the past- patient was very intolerant of CSW and would demand that CSW not enter his room.).  Over time, a relationship has been forged with patient and he is now welcoming and accepting of visit. He is not forthcoming about his continued drug use and  will often deny use even when reminded of current toxicology reports.  He plans to return to same housing situation at d/c despite this being unsatisfatory.  CSW will need to arrange transportation at d/c when medically stable.  Lorie Phenix. [place], [place] (weekend coverage)

## 2014-05-24 NOTE — Evaluation (Signed)
Physical Therapy Evaluation Patient Details Name: Charles [name] MRN: [id number] DOB: 02-Oct-1945 Today's Date: 05/24/2014   History of Present Illness  69 y.o. 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  Clinical Impression  Pt admitted with the above diagnosis. Pt currently with functional limitations due to the deficits listed below (see PT Problem List). Ambulates generally well with a rolling walker up to 125 feet today. His distance is limited by BIL LE pain that increases with distance. SpO2 97% on room air while ambulating. No loss of balance although states he falls at home because he does not use an assistive device and would very much like a rolling walker. Pt will benefit from skilled PT to increase their independence and safety with mobility to allow discharge to the venue listed below.       Follow Up Recommendations Home health PT    Equipment Recommendations  Rolling walker with 5" wheels    Recommendations for Other Services       Precautions / Restrictions Precautions Precautions: Fall Precaution Comments: States his legs buckle at home Restrictions Weight Bearing Restrictions: No      Mobility  Bed Mobility               General bed mobility comments: In chair at start of therapy  Transfers Overall transfer level: Needs assistance Equipment used: Rolling walker (2 wheeled) Transfers: Sit to/from Stand Sit to Stand: Supervision         General transfer comment: Supervision for safety. VC for hand placement.  Ambulation/Gait Ambulation/Gait assistance: Min guard Ambulation Distance (Feet): 125 Feet Assistive device: Rolling walker (2 wheeled) Gait Pattern/deviations: Step-through pattern;Decreased stride length Gait velocity: decreased   General Gait Details: SpO2 97% on room air while ambulating. Reports some SOB which resolves quickly after sitting. No loss of balance or buckling noted during bout.  Pt had to stop at 125 feet due to BIL LE pain. educated on safe DME [place] with a rolling walker.  Stairs            Wheelchair Mobility    Modified Rankin (Stroke Patients Only)       Balance Overall balance assessment: Needs assistance;History of Falls Sitting-balance support: No upper extremity supported;Feet supported Sitting balance-Leahy Scale: Normal     Standing balance support: No upper extremity supported Standing balance-Leahy Scale: Fair Standing balance comment: States he has had 2 falls recently due to BIL LE buckling.                             Pertinent Vitals/Pain Pain Assessment: 0-10 Pain Score: 8  Pain Location: BIL LEs  Pain Descriptors / Indicators: Aching Pain Intervention(s): Limited activity within patient's tolerance;Monitored during session;Repositioned    Home Living Family/patient expects to be discharged to:: Private residence Living Arrangements: Children Available Help at Discharge: Family;Available 24 hours/day Type of Home: House Home Access: Stairs to enter Entrance Stairs-Rails: Right;Left;Can reach both Entrance Stairs-Number of Steps: 4 Home Layout: Two level Home Equipment: None      Prior Function Level of Independence: Independent               Hand Dominance   Dominant Hand: Right    Extremity/Trunk Assessment   Upper Extremity Assessment: Defer to OT evaluation           Lower Extremity Assessment: Generalized weakness         Communication   Communication: No  difficulties  Cognition Arousal/Alertness: Awake/alert Behavior During Therapy: WFL for tasks assessed/performed Overall Cognitive Status: Within Functional Limits for tasks assessed                      General Comments General comments (skin integrity, edema, etc.): States his greatest complaint is BIL LE pain while ambulating that increases with distance.    Exercises        Assessment/Plan    PT Assessment  Patient needs continued PT services  PT Diagnosis Difficulty walking;Acute pain;Generalized weakness   PT Problem List Decreased strength;Decreased activity tolerance;Decreased balance;Decreased mobility;Decreased knowledge of use of DME;Pain  PT Treatment Interventions DME instruction;Gait training;Stair training;Functional mobility training;Therapeutic activities;Therapeutic exercise;Balance training;Neuromuscular re-education;Patient/family education;Modalities   PT Goals (Current goals can be found in the Care Plan section) Acute Rehab PT Goals Patient Stated Goal: Go home with a walker PT Goal Formulation: With patient Time For Goal Achievement: 06/07/14 Potential to Achieve Goals: Good    Frequency Min 3X/week   Barriers to discharge        Co-evaluation               End of Session Equipment Utilized During Treatment: Gait belt Activity Tolerance: Patient tolerated treatment well Patient left: in chair;with call bell/phone within reach Nurse Communication: Mobility status         Time: 9528-4132 PT Time Calculation (min) (ACUTE ONLY): 15 min   Charges:   PT Evaluation $Initial PT Evaluation Tier I: 1 Procedure     PT G CodesEllouise Newer 05/24/2014, 4:44 PM Camille Bal [place], [place]

## 2014-05-24 NOTE — Progress Notes (Signed)
Pt does not want to wear our CPAP. Will wear when he receives his own machine.

## 2014-05-25 LAB — BASIC METABOLIC PANEL
Anion gap: 5 (ref 5–15)
BUN: 18 mg/dL (ref 6–23)
CHLORIDE: 100 mmol/L (ref 96–112)
CO2: 27 mmol/L (ref 19–32)
Calcium: 8.7 mg/dL (ref 8.4–10.5)
Creatinine, Ser: 0.99 mg/dL (ref 0.50–1.35)
GFR calc Af Amer: >90 mL/min (ref >90)
GFR calc non Af Amer: 81 mL/min — ABNORMAL LOW (ref >90)
GLUCOSE: 76 mg/dL (ref 70–99)
Potassium: 4.3 mmol/L (ref 3.5–5.1)
Sodium: 132 mmol/L — ABNORMAL LOW (ref 135–145)

## 2014-05-25 MED ORDER — LISINOPRIL 2.5 MG PO TABS: 2.5000 mg | ORAL_TABLET | Freq: Every day | ORAL | Status: DC

## 2014-05-25 MED ORDER — FUROSEMIDE 80 MG PO TABS: 40.0000 mg | ORAL_TABLET | Freq: Two times a day (BID) | ORAL | Status: DC

## 2014-05-25 MED ORDER — ENOXAPARIN (LOVENOX) PATIENT EDUCATION KIT: PACK | Freq: Once | Status: DC

## 2014-05-25 MED ORDER — HYDRALAZINE HCL 50 MG PO TABS
50.0000 mg | ORAL_TABLET | Freq: Three times a day (TID) | ORAL | Status: DC
  Filled 2014-05-25 (×2): qty 1

## 2014-05-25 MED ORDER — LISINOPRIL 2.5 MG PO TABS
2.5000 mg | ORAL_TABLET | Freq: Every day | ORAL | Status: DC
  Filled 2014-05-25: qty 1

## 2014-05-25 MED ORDER — HYDRALAZINE HCL 50 MG PO TABS
50.0000 mg | ORAL_TABLET | Freq: Three times a day (TID) | ORAL | Status: DC
Start: 2014-05-25 — End: 2014-06-06

## 2014-05-25 NOTE — Discharge Instructions (Signed)

## 2014-05-25 NOTE — Progress Notes (Signed)
    Subjective:  I'm weak doc. Pain med not helping for pain.   Objective:  Vital Signs in the last 24 hours: Temp:  [98.2 F (36.8 C)] 98.2 F (36.8 C) (03/06 1916) Pulse Rate:  [77] 77 (03/06 1916) Resp:  [20] 20 (03/06 1916) BP: (125)/(69) 125/69 mmHg (03/06 1916) SpO2:  [99 %] 99 % (03/06 1916) Weight:  [169 lb (76.658 kg)] 169 lb (76.658 kg) (03/07 0210)  Intake/Output from previous day: 03/06 0701 - 03/07 0700 In: -  Out: [place] [Urine:675]   Physical Exam: General: Well developed, well nourished, in no acute distress. Head:  Normocephalic and atraumatic. Lungs: Clear to auscultation and percussion. Heart: Normal S1 and S2.  No murmur, rubs or gallops.  Abdomen: soft, non-tender, positive bowel sounds. Extremities: No clubbing or cyanosis. Bilat leg mild pain. Probation bracelet on ankle.  Neurologic: Alert and oriented x 3.    Lab Results:  Recent Labs  05/22/14 0915 05/23/14 0350  WBC 6.4 6.8  HGB 8.6* 8.3*  PLT 272 291    Recent Labs  05/23/14 0350 05/25/14 0426  NA 134* 132*  K 3.8 4.3  CL 97 100  CO2 31 27  GLUCOSE 100* 76  BUN 18 18  CREATININE 1.13 0.99    Telemetry: no adverse rhythm occas PVC's Personally viewed.   Scheduled Meds: . amLODipine  10 mg Oral Daily  . aspirin EC  81 mg Oral Daily  . Chlorhexidine Gluconate Cloth  6 each Topical Q0600  . citalopram  20 mg Oral Daily  . cloNIDine  0.1 mg Oral BID  . ferrous sulfate  325 mg Oral BID WC  . furosemide  40 mg Oral BID  . gabapentin  100 mg Oral BID  . hydrALAZINE  75 mg Oral TID  . isosorbide mononitrate  30 mg Oral Daily  . mupirocin ointment  1 application Nasal BID  . pantoprazole  40 mg Oral Daily  . phenytoin  200 mg Oral Daily  . phenytoin  300 mg Oral QHS  . 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 Myoview no ischemia 11/27/13   Chronic kidney disease (CKD), stage III (moderate)   Moderate to severe pulmonary hypertension   Polysubstance abuse   Chest pain   Cocaine abuse   Hyponatremia   Acute on chronic combined systolic and diastolic CHF (congestive heart failure)  69 year old prior CABG, EF 45-50%, cocaine.  1) Acute on chronic systolic and diastolic HF  - appears euvolemic  - continue lasix 40 PO BID  - ? Why he is not on ACE-I. Perhaps CKD with AKI on 02/08/14 with creast of 1.8? Would recommend restarting (was on lisinopril 20).  If restarted, could potentially back down on hydralazine and isosorbide if needed.   2) Cocaine cessation  3) Weakness - PT/OT  OK with DC whenever main team is comfortable. Dr. Bensimohn/Hilty/Hochrein initially in 2010 is cardiologist.     Marlou Porch, [place] 05/25/2014, 9:02 AM

## 2014-05-25 NOTE — Evaluation (Signed)
Occupational Therapy Evaluation Patient Details Name: Charles [name] MRN: [id number] DOB: 1945-07-31 Today's Date: 05/25/2014    History of Present Illness 69 y.o. 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   Clinical Impression   Pt currently supervision level for all selfcare tasks performed.  Does report generalized pain in his legs and shoulders with AROM but feel this does not affect ADL performance.  Will have 24 hour supervision at home.  No acute or post acute OT needs.  Discussed use of a shower seat initially in the shower for endurance and safety.  Pt's family member will obtain.    Follow Up Recommendations  No OT follow up;Supervision - Intermittent    Equipment Recommendations  None recommended by OT       Precautions / Restrictions Precautions Precautions: Fall Restrictions Weight Bearing Restrictions: No      Mobility Bed Mobility                  Transfers Overall transfer level: Needs assistance Equipment used: Rolling walker (2 wheeled) Transfers: Sit to/from Stand Sit to Stand: Supervision              Balance Overall balance assessment: Needs assistance   Sitting balance-Leahy Scale: Normal       Standing balance-Leahy Scale: Fair                              ADL Overall ADL's : Needs assistance/impaired Eating/Feeding: Independent;Sitting   Grooming: Wash/dry hands;Supervision/safety;Standing   Upper Body Bathing: Sitting;Supervision/ safety   Lower Body Bathing: Sit to/from stand;Supervison/ safety   Upper Body Dressing : Supervision/safety;Sitting   Lower Body Dressing: Sit to/from stand;Supervision/safety   Toilet Transfer: Supervision/safety;RW;Regular Toilet;Grab bars;Ambulation   Toileting- Clothing Manipulation and Hygiene: Sit to/from stand;Supervision/safety   Tub/ Shower Transfer: Tub transfer;Shower seat;Rolling walker;Ambulation     General ADL  Comments: Pt reporting general soreness and tightness all over especially in his legs.  Supervision for simulated selfcare tasks and functional transfers.  He reports having grab bars next to his toilet at home.  Discussed need for his daughter to get a small plastic lawn chair for use in the shower for safety as well as a hand held shower.      Vision Vision Assessment?: No apparent visual deficits   Perception Perception Perception Tested?: No   Praxis Praxis Praxis tested?: Within functional limits    Pertinent Vitals/Pain Pain Assessment: 0-10 Pain Score: 9  Pain Location: bilateral LEs     Hand Dominance Right   Extremity/Trunk Assessment Upper Extremity Assessment Upper Extremity Assessment: Generalized weakness (Pt reporting shoulder pain bilaterally with flexion but strength 4/5 throughout)           Communication Communication Communication: No difficulties   Cognition Arousal/Alertness: Awake/alert Behavior During Therapy: WFL for tasks assessed/performed Overall Cognitive Status: Within Functional Limits for tasks assessed                                Home Living Family/patient expects to be discharged to:: Private residence Living Arrangements: Children Available Help at Discharge: Family;Available 24 hours/day Type of Home: House Home Access: Stairs to enter [employer] of Steps: 4 Entrance Stairs-Rails: Right;Left;Can reach both Home Layout: Two level     Bathroom Shower/Tub: Tub/shower unit Shower/tub characteristics: Architectural technologist: Standard Bathroom Accessibility: No   Home Equipment: None  Prior Functioning/Environment Level of Independence: Independent                                       End of Session Equipment Utilized During Treatment: Rolling walker Nurse Communication: Mobility status  Activity Tolerance: Patient tolerated treatment well Patient left: in chair;with call  bell/phone within reach   Time: 0851-0912 OT Time Calculation (min): 21 min Charges:  OT General Charges $OT Visit: 1 Procedure OT Evaluation $Initial OT Evaluation Tier I: 1 Procedure OT Treatments $Self Care/Home Management : 8-22 mins  Lorely Bubb OTR/L 05/25/2014, 10:28 AM

## 2014-05-25 NOTE — Progress Notes (Signed)
CARE MANAGEMENT NOTE 05/25/2014  Patient:  Charles [name], Charles [name]   Account Number:  [id number]  Date Initiated:  05/21/2014  Documentation initiated by:  Marvetta Gibbons  Subjective/Objective Assessment:   Pt admitteed with CHF     Action/Plan:   PTA Pt lived at home- independent with ADLs - + for cocaine on admission   Anticipated DC Date:  05/22/2014   Anticipated DC Plan:  HOME/SELF CARE  In-house referral  Clinical Social Worker      DC Forensic scientist  CM consult      [facility] Choice  HOME HEALTH   Choice offered to / List presented to:  C-1 Patient   DME arranged  Vassie Moselle      DME agency  [place] arranged  [place] - 11 Patient Refused      Status of service:  Completed, signed off Medicare Important Message given?  YES (If response is "NO", the following Medicare IM given date fields will be blank) Date Medicare IM given:  05/22/2014 Medicare IM given by:  Whitman Hero Date Additional Medicare IM given:  05/25/2014 Additional Medicare IM given by:  Jonnie Finner  Discharge Disposition:  HOME/SELF CARE  Per UR Regulation:  Reviewed for med. necessity/level of care/duration of stay  If discussed at Accident of Stay Meetings, dates discussed:    Comments:  05/25/2014 1020 NCM spoke to pt and states he does not want HH at this time. Lives at home with dtr, [name] # [phone]. States he takes his meds as prescribed and tries to eat a healthy diet. States his appetite has been poor lately. NCM explained the benefits of having a HH RN to help him manage his CHF. Pt refused. Explained if he decides on [facility] that his PCP can order. Contacted [facility] for [facility] for home. NCM discussed with pt outpt drug rehab clinics to help him with drug abuse. Pt states he will consider. CSW referral for list of outpt drug rehab clinics. Jonnie Finner RN CCM Case Mgmt phone [phone]

## 2014-05-25 NOTE — Progress Notes (Signed)
Pt ambulated 200 feet in hall with front wheel walker. Patient complained of achy-cramping in legs during walk, improves with standing rest break. Back to chair with chair alarm set.

## 2014-05-25 NOTE — Discharge Summary (Signed)
Physician Discharge Summary  Charles [name] AJO:[id number] DOB: 1945/10/01 DOA: 05/20/2014  PCP: [name]  Admit date: 05/20/2014 Discharge date: 05/25/2014  Time spent: 35 minutes  Recommendations for Outpatient Follow-up:  1. B-met to follow renal function. 2. Further adjustment of HF medications.   Discharge Diagnoses:    Acute on chronic combined systolic and diastolic heart failure   Iron deficiency anemia   CAD- CABG x3 '09. Myoview no ischemia 11/27/13   Chronic kidney disease (CKD), stage III (moderate)   Moderate to severe pulmonary hypertension   Polysubstance abuse   Chest pain   Cocaine abuse   Hyponatremia   Acute on chronic combined systolic and diastolic CHF (congestive heart failure)   Discharge Condition: Stable.   Diet recommendation: Heart Healthy  Filed Weights   05/23/14 0500 05/24/14 0300 05/25/14 0210  Weight: 77.5 kg (170 lb 13.7 oz) 78 kg (171 lb 15.3 oz) 76.658 kg (169 lb)    History of present illness:  69 year old male with history of combined congestive heart failure, ongoing cocaine use presented with three-day history of constant chest pain, increasing lower extremity edema and pain. Initial evaluation suggested acute on chronic heart failure. Treatment included aspirin, Lasix, GI cocktail, lorazepam and nitroglycerin.  Patient reports increasing swelling in his legs with associated pain. He reports constant left-sided chest pain present for the last 3 days without aggravating or alleviating factors. Described as being sharp in nature. Intense. Some associated shortness of breath. No nausea or vomiting. He does report using cocaine but says the last use was one week ago.  In the emergency department afebrile, vital signs stable. No hypoxia. Sodium 9:47, basic metabolic panel otherwise unremarkable. BNP 2249. Troponin point of care unremarkable. Chest x-ray independently reviewed showed pulmonary vascular congestion, CHF. EKG sinus rhythm,  nonspecific ST changes.   Hospital Course:  1-Acute on chronic systolic/diastolic congestive heart failure. Weight on 2-11 was at 75 Kg prior to discharge.[name]  ECHO; LVEF 45%. , diastolic dysfunction. diffuse hypokinesis.  Continue with lasix 40 Mg PO BID.  Weight 81---79--81---77---76 Negative 5 L.  Feeling better, breathing better.   Anemia; S/P one unit PRBC. Hb at 8.  Lower extremity pain and edema. Doppler negative for DVT, positive baker cyst,   Chest pain;  Chest pain better. Mildly elevated troponin.  Nitroglycerin PRN.  Cardiology following.  Continue with Imdur, statin, aspirin.   Seizure; continue with dilantin.  HTN; continue with Norvasc, clonidine.   Acute hyponatremia; in setting heart failure. Improving.   HB A-1c 5.2. Was not on medication.   Polysubstance abuse including cocaine with UDS positive for cocaine on admission Counseling provide.   Procedures:  ECHO; Ef 45 %  Consultations:  Cardiology  Discharge Exam: Filed Vitals:   05/25/14 0915  BP:   Pulse: 80  Temp:   Resp:     General: Alert in no distress.  Cardiovascular: S 1, S 2 RRR Respiratory: CTA  Discharge Instructions   Discharge Instructions    Diet - low sodium heart healthy    Complete by:  As directed      Increase activity slowly    Complete by:  As directed           Current Discharge Medication List    START taking these medications   Details  lisinopril (PRINIVIL,ZESTRIL) 2.5 MG tablet Take 1 tablet (2.5 mg total) by mouth daily. Qty: 30 tablet, Refills: 0      CONTINUE these medications which have CHANGED   Details  furosemide (LASIX) 80 MG tablet Take 0.5 tablets (40 mg total) by mouth 2 (two) times daily. Qty: 30 tablet, Refills: 0    hydrALAZINE (APRESOLINE) 50 MG tablet Take 1 tablet (50 mg total) by mouth 3 (three) times daily. Qty: 60 tablet, Refills: 0      CONTINUE these medications which have NOT CHANGED   Details  albuterol (PROVENTIL  HFA;VENTOLIN HFA) 108 (90 BASE) MCG/ACT inhaler Inhale 2 puffs into the lungs every 6 (six) hours as needed for wheezing or shortness of breath. Qty: 1 Inhaler, Refills: 2    amLODipine (NORVASC) 10 MG tablet Take 10 mg by mouth daily.    aspirin EC 81 MG tablet Take 81 mg by mouth daily.    citalopram (CELEXA) 20 MG tablet Take 20 mg by mouth daily.    cloNIDine (CATAPRES) 0.2 MG tablet Take 0.5 tablets (0.1 mg total) by mouth 2 (two) times daily. Qty: 30 tablet, Refills: 0    ferrous sulfate 325 (65 FE) MG tablet Take 325 mg by mouth 2 (two) times daily with a meal.     gabapentin (NEURONTIN) 100 MG capsule Take 100 mg by mouth 2 (two) times daily.    isosorbide mononitrate (IMDUR) 30 MG 24 hr tablet Take 30 mg by mouth daily.    pantoprazole (PROTONIX) 40 MG tablet Take 40 mg by mouth daily.     phenytoin (DILANTIN) 100 MG ER capsule Take 200-300 mg by mouth 2 (two) times daily. 27m in the morning, 3025min the evening    pravastatin (PRAVACHOL) 40 MG tablet Take 1 tablet (40 mg total) by mouth daily at 6 PM. Qty: 30 tablet, Refills: 0    nitroGLYCERIN (NITROSTAT) 0.4 MG SL tablet Place 1 tablet (0.4 mg total) under the tongue every 5 (five) minutes as needed for chest pain (upto max 3 doses at one time.). Qty: 30 tablet, Refills: 0       Allergies  Allergen Reactions  . Motrin [Ibuprofen] Other (See Comments)    Affects kidneys  . Tylenol [Acetaminophen] Other (See Comments)    Affects kidneys      The results of significant diagnostics from this hospitalization (including imaging, microbiology, ancillary and laboratory) are listed below for reference.    Significant Diagnostic Studies: Dg Chest 2 View  04/26/2014   CLINICAL DATA:  Left-sided chest pain and shortness of breath since this morning. Bilateral leg swelling 3 days.  EXAM: CHEST  2 VIEW  COMPARISON:  03/01/2014 and 01/01/2014  FINDINGS: Sternotomy wires unchanged. Lungs are adequately inflated without focal  consolidation. Mild prominence of the perihilar markings unchanged likely mild degree of vascular congestion. Possible small amount left pleural fluid. Moderate stable cardiomegaly. Remainder of the exam is unchanged.  IMPRESSION: Stable cardiomegaly with suggestion of minimal vascular congestion. Possible small amount left pleural fluid.   Electronically Signed   By: DaMarin Olp.D.   On: 04/26/2014 09:45   Dg Chest Port 1 View  05/20/2014   CLINICAL DATA:  Cough and weakness, known heart failure  EXAM: PORTABLE CHEST - 1 VIEW  COMPARISON:  04/26/2014  FINDINGS: Cardiac shadow remains enlarged. Postsurgical changes are seen. Vascular congestion with pulmonary edema is noted consistent with CHF. No bony abnormality is noted.  IMPRESSION: Changes consistent with CHF.   Electronically Signed   By: MaInez Catalina.D.   On: 05/20/2014 10:03   Ct Angio Chest Aorta W/cm &/or Wo/cm  04/26/2014   CLINICAL DATA:  Left-sided chest and. Rule out dissection.  Initial encounter. Left leg swelling. Abdominal bloating. Diabetes. Coronary artery disease.  EXAM: CT ANGIOGRAPHY CHEST, ABDOMEN AND PELVIS  TECHNIQUE: Multidetector CT imaging through the chest, abdomen and pelvis was performed using the standard protocol during bolus administration of intravenous contrast. Multiplanar reconstructed images and MIPs were obtained and reviewed to evaluate the vascular anatomy.  CONTRAST:  158m OMNIPAQUE IOHEXOL 350 MG/ML SOLN  COMPARISON:  Chest radiograph of 04/26/2014. Chest CT 10/23/2013. Abdominal pelvic CT of 07/16/2013.  FINDINGS: CT CHEST FINDINGS  Mediastinum/Nodes: Mild bilateral gynecomastia.  Normal caliber the great vessels, with mild atherosclerosis within. Prior median sternotomy for CABG. No evidence of thoracic aortic dissection. Ascending aorta measures 4.4 cm at the sinuses of Valsalva, 3.2 cm at the sino-tubular junction, and 3.8 cm at the mid ascending segment. Felt to be similar.  Moderate cardiomegaly, without  pericardial effusion. Pulmonary artery is enlarged, with the outflow tract measuring 4.1 cm. No pulmonary embolism identified.  Mild thoracic adenopathy. Example 1.6 cm precarinal node, unchanged. This is unchanged since the prior exam. No hilar adenopathy.  Lungs/Pleura: Mild motion degradation. Poor inspiratory effort. Mild mosaic attenuation which could relate to air trapping or mild pulmonary venous congestion.  Small left greater than right pleural effusions, increased since 10/23/2013.  Musculoskeletal: No acute osseous abnormality.  CT ABDOMEN PELVIS FINDINGS  Hepatobiliary: Mildly irregular hepatic capsule, suggesting cirrhosis. Normal gallbladder and biliary tract.  Motion degradation continues into the abdomen.  Pancreas: No pancreatic duct dilatation or focal mass. There is interstitial thickening in the peripancreatic space, including on image 143.  Spleen: Normal  Adrenals/Urinary Tract: Normal adrenal glands. Interpolar left renal lesion measures 1.4 cm and greater than fluid density on image 138. This was present back to 03/24/2010, and similar in size on 07/16/2013. Normal right kidney, without hydronephrosis.  Bladder wall is thickened.  High density material within.  Stomach/Bowel: Proximal gastric underdistention. Normal colon, appendix, and terminal ileum. Normal small bowel.  Vascular/Lymphatic: Abdominal aorta is normal in caliber, with atherosclerosis within. Atherosclerotic irregularity at the origin of both renal arteries, without hemodynamically significant stenosis. The inferior mesenteric artery is patent.  The left internal iliac is significantly narrowed at its origin. No abdominopelvic adenopathy.  Reproductive: Normal prostate.  Other: Small fat containing left inguinal hernia. Small volume abdominal pelvic ascites.  Diffuse anasarca  Musculoskeletal: Degenerative disc disease at the L4-5 level. Multiple lumbosacral disc bulges. S-shaped thoracolumbar spine curvature.  Review of the  MIP images confirms the above findings.  IMPRESSION: CT CHEST IMPRESSION  1. Mild motion degradation. 2. No evidence of thoracic aortic dissection. Mild ascending aortic prominence, as detailed above. Recommend annual imaging followup by CTA or MRA. This recommendation follows 2010 ACCF/AHA/AATS/ACR/ASA/SCA/SCAI/SIR/STS/SVM Guidelines for the Diagnosis and Management of Patients With Thoracic Aortic Disease. Circulation. 2010; 121: eZ610-R6043. Probable congestive heart failure, with bilateral pleural effusions. 4. Pulmonary artery enlargement suggests pulmonary arterial hypertension. 5. Similar mediastinal adenopathy. This could be related to congestive heart failure. Recommend attention on follow-up.  CT ABDOMEN AND PELVIS IMPRESSION  1. Mild motion degradation. 2. Advanced aortic atherosclerosis, without dissection or aneurysm. 3. Abdominal pelvic ascites.  Suspect mild cirrhosis. 4. Peripancreatic interstitial thickening. Cannot exclude pancreatitis. This appearance is nonspecific in the setting of ascites. 5. Pericystic edema. This could represent cystitis. Increased density within the urinary bladder could represent Early contrast excretion or hemorrhage. Consider correlation with urinalysis.   Electronically Signed   By: KAbigail MiyamotoM.D.   On: 04/26/2014 11:44   Ct Cta Abd/pel W/cm &/or W/o  Cm  04/26/2014   CLINICAL DATA:  Left-sided chest and. Rule out dissection. Initial encounter. Left leg swelling. Abdominal bloating. Diabetes. Coronary artery disease.  EXAM: CT ANGIOGRAPHY CHEST, ABDOMEN AND PELVIS  TECHNIQUE: Multidetector CT imaging through the chest, abdomen and pelvis was performed using the standard protocol during bolus administration of intravenous contrast. Multiplanar reconstructed images and MIPs were obtained and reviewed to evaluate the vascular anatomy.  CONTRAST:  167m OMNIPAQUE IOHEXOL 350 MG/ML SOLN  COMPARISON:  Chest radiograph of 04/26/2014. Chest CT 10/23/2013. Abdominal pelvic CT  of 07/16/2013.  FINDINGS: CT CHEST FINDINGS  Mediastinum/Nodes: Mild bilateral gynecomastia.  Normal caliber the great vessels, with mild atherosclerosis within. Prior median sternotomy for CABG. No evidence of thoracic aortic dissection. Ascending aorta measures 4.4 cm at the sinuses of Valsalva, 3.2 cm at the sino-tubular junction, and 3.8 cm at the mid ascending segment. Felt to be similar.  Moderate cardiomegaly, without pericardial effusion. Pulmonary artery is enlarged, with the outflow tract measuring 4.1 cm. No pulmonary embolism identified.  Mild thoracic adenopathy. Example 1.6 cm precarinal node, unchanged. This is unchanged since the prior exam. No hilar adenopathy.  Lungs/Pleura: Mild motion degradation. Poor inspiratory effort. Mild mosaic attenuation which could relate to air trapping or mild pulmonary venous congestion.  Small left greater than right pleural effusions, increased since 10/23/2013.  Musculoskeletal: No acute osseous abnormality.  CT ABDOMEN PELVIS FINDINGS  Hepatobiliary: Mildly irregular hepatic capsule, suggesting cirrhosis. Normal gallbladder and biliary tract.  Motion degradation continues into the abdomen.  Pancreas: No pancreatic duct dilatation or focal mass. There is interstitial thickening in the peripancreatic space, including on image 143.  Spleen: Normal  Adrenals/Urinary Tract: Normal adrenal glands. Interpolar left renal lesion measures 1.4 cm and greater than fluid density on image 138. This was present back to 03/24/2010, and similar in size on 07/16/2013. Normal right kidney, without hydronephrosis.  Bladder wall is thickened.  High density material within.  Stomach/Bowel: Proximal gastric underdistention. Normal colon, appendix, and terminal ileum. Normal small bowel.  Vascular/Lymphatic: Abdominal aorta is normal in caliber, with atherosclerosis within. Atherosclerotic irregularity at the origin of both renal arteries, without hemodynamically significant stenosis.  The inferior mesenteric artery is patent.  The left internal iliac is significantly narrowed at its origin. No abdominopelvic adenopathy.  Reproductive: Normal prostate.  Other: Small fat containing left inguinal hernia. Small volume abdominal pelvic ascites.  Diffuse anasarca  Musculoskeletal: Degenerative disc disease at the L4-5 level. Multiple lumbosacral disc bulges. S-shaped thoracolumbar spine curvature.  Review of the MIP images confirms the above findings.  IMPRESSION: CT CHEST IMPRESSION  1. Mild motion degradation. 2. No evidence of thoracic aortic dissection. Mild ascending aortic prominence, as detailed above. Recommend annual imaging followup by CTA or MRA. This recommendation follows 2010 ACCF/AHA/AATS/ACR/ASA/SCA/SCAI/SIR/STS/SVM Guidelines for the Diagnosis and Management of Patients With Thoracic Aortic Disease. Circulation. 2010; 121: [id number]. Probable congestive heart failure, with bilateral pleural effusions. 4. Pulmonary artery enlargement suggests pulmonary arterial hypertension. 5. Similar mediastinal adenopathy. This could be related to congestive heart failure. Recommend attention on follow-up.  CT ABDOMEN AND PELVIS IMPRESSION  1. Mild motion degradation. 2. Advanced aortic atherosclerosis, without dissection or aneurysm. 3. Abdominal pelvic ascites.  Suspect mild cirrhosis. 4. Peripancreatic interstitial thickening. Cannot exclude pancreatitis. This appearance is nonspecific in the setting of ascites. 5. Pericystic edema. This could represent cystitis. Increased density within the urinary bladder could represent Early contrast excretion or hemorrhage. Consider correlation with urinalysis.   Electronically Signed   By: KAbigail Miyamoto  M.D.   On: 04/26/2014 11:44    Microbiology: No results found for this or any previous visit (from the past 240 hour(s)).   Labs: Basic Metabolic Panel:  Recent Labs Lab 05/20/14 0947 05/21/14 0403 05/22/14 0408 05/23/14 0350 05/25/14 0426   NA 130* 133* 135 134* 132*  K 4.2 4.2 3.8 3.8 4.3  CL 100 98 98 97 100  CO2 25 24 29 31 27   GLUCOSE 79 92 118* 100* 76  BUN 22 24* 20 18 18   CREATININE 1.30 1.36* 1.38* 1.13 0.99  CALCIUM 8.6 8.6 8.7 8.5 8.7   Liver Function Tests: No results for input(s): AST, ALT, ALKPHOS, BILITOT, PROT, ALBUMIN in the last 168 hours. No results for input(s): LIPASE, AMYLASE in the last 168 hours. No results for input(s): AMMONIA in the last 168 hours. CBC:  Recent Labs Lab 05/20/14 0947 05/21/14 1307 05/22/14 0915 05/23/14 0350  WBC 4.5 5.5 6.4 6.8  HGB 7.6* 7.4* 8.6* 8.3*  HCT 25.0* 24.4* 27.3* 26.9*  MCV 81.7 83.0 82.5 81.8  PLT 318 279 272 291   Cardiac Enzymes:  Recent Labs Lab 05/20/14 1026 05/20/14 1700 05/20/14 2145 05/21/14 0403 05/24/14 0915  CKTOTAL 67  --   --   --  41  TROPONINI  --  0.08* 0.04* 0.04*  --    BNP: BNP (last 3 results)  Recent Labs  04/26/14 0904 05/20/14 0947  BNP 2313.3* 2249.6*    ProBNP (last 3 results)  Recent Labs  01/01/14 1107 02/07/14 0123 03/01/14 2250  PROBNP 3225.0* 2648.0* 7421.0*    CBG:  Recent Labs Lab 05/20/14 1616 05/20/14 2126 05/21/14 0622  GLUCAP 172* 122* 94       Signed:  Regalado, Belkys A  [facility] 05/25/2014, 10:35 AM

## 2014-05-25 NOTE — Progress Notes (Signed)
CARE MANAGEMENT NOTE 05/25/2014  Patient:  Charles [name], Charles [name]   Account Number:  [id number]  Date Initiated:  05/21/2014  Documentation initiated by:  Marvetta Gibbons  Subjective/Objective Assessment:   Pt admitteed with CHF     Action/Plan:   PTA Pt lived at home- independent with ADLs - + for cocaine on admission   Anticipated DC Date:  05/22/2014   Anticipated DC Plan:  [place]  CM consult      [facility] Choice  HOME HEALTH   Choice offered to / List presented to:  C-1 Patient   DME arranged  Vassie Moselle      DME agency  [place] arranged  [place] - 11 Patient Refused      Status of service:  Completed, signed off Medicare Important Message given?  YES (If response is "NO", the following Medicare IM given date fields will be blank) Date Medicare IM given:  05/22/2014 Medicare IM given by:  Whitman Hero Date Additional Medicare IM given:  05/25/2014 Additional Medicare IM given by:  Jonnie Finner  Discharge Disposition:  HOME/SELF CARE  Per UR Regulation:  Reviewed for med. necessity/level of care/duration of stay  If discussed at [place] of Stay Meetings, dates discussed:    Comments:  05/25/2014 1020 NCM spoke to pt and states he does not want HH at this time. Lives at home with dtr, Charles [name] # [phone]. States he takes his meds as prescribed and tries to eat a healthy diet. States his appetite has been poor lately. NCM explained the benefits of having a HH RN to help him manage his CHF. Pt refused. Explained if he decides on [facility] that his PCP can order. Contacted [facility] for [place] for home. Jonnie Finner RN CCM Case Mgmt phone [phone]

## 2014-06-06 ENCOUNTER — Inpatient Hospital Stay (HOSPITAL_BASED_OUTPATIENT_CLINIC_OR_DEPARTMENT_OTHER)
Admission: EM | Admit: 2014-06-06 | Discharge: 2014-06-09 | DRG: 299 | Disposition: A | Discharge status: Home / Self Care | Payer: Medicare Other | Attending: Internal Medicine | Admitting: Internal Medicine

## 2014-06-06 ENCOUNTER — Emergency Department (HOSPITAL_BASED_OUTPATIENT_CLINIC_OR_DEPARTMENT_OTHER): Payer: Medicare Other

## 2014-06-06 ENCOUNTER — Encounter (HOSPITAL_BASED_OUTPATIENT_CLINIC_OR_DEPARTMENT_OTHER): Payer: Self-pay | Admitting: *Deleted

## 2014-06-06 DIAGNOSIS — E785 Hyperlipidemia, unspecified: Secondary | ICD-10-CM | POA: Diagnosis present

## 2014-06-06 DIAGNOSIS — N183 Chronic kidney disease, stage 3 (moderate): Secondary | ICD-10-CM | POA: Diagnosis present

## 2014-06-06 DIAGNOSIS — M5136 Other intervertebral disc degeneration, lumbar region: Secondary | ICD-10-CM | POA: Diagnosis present

## 2014-06-06 DIAGNOSIS — F17210 Nicotine dependence, cigarettes, uncomplicated: Secondary | ICD-10-CM | POA: Diagnosis present

## 2014-06-06 DIAGNOSIS — K31819 Angiodysplasia of stomach and duodenum without bleeding: Secondary | ICD-10-CM

## 2014-06-06 DIAGNOSIS — I2510 Atherosclerotic heart disease of native coronary artery without angina pectoris: Secondary | ICD-10-CM | POA: Diagnosis present

## 2014-06-06 DIAGNOSIS — E119 Type 2 diabetes mellitus without complications: Secondary | ICD-10-CM | POA: Diagnosis present

## 2014-06-06 DIAGNOSIS — Q2733 Arteriovenous malformation of digestive system vessel: Principal | ICD-10-CM

## 2014-06-06 DIAGNOSIS — I5042 Chronic combined systolic (congestive) and diastolic (congestive) heart failure: Secondary | ICD-10-CM

## 2014-06-06 DIAGNOSIS — I701 Atherosclerosis of renal artery: Secondary | ICD-10-CM | POA: Diagnosis present

## 2014-06-06 DIAGNOSIS — I129 Hypertensive chronic kidney disease with stage 1 through stage 4 chronic kidney disease, or unspecified chronic kidney disease: Secondary | ICD-10-CM | POA: Diagnosis present

## 2014-06-06 DIAGNOSIS — I739 Peripheral vascular disease, unspecified: Secondary | ICD-10-CM | POA: Diagnosis present

## 2014-06-06 DIAGNOSIS — G4733 Obstructive sleep apnea (adult) (pediatric): Secondary | ICD-10-CM | POA: Diagnosis present

## 2014-06-06 DIAGNOSIS — I10 Essential (primary) hypertension: Secondary | ICD-10-CM | POA: Diagnosis not present

## 2014-06-06 DIAGNOSIS — D649 Anemia, unspecified: Secondary | ICD-10-CM | POA: Diagnosis not present

## 2014-06-06 DIAGNOSIS — Z951 Presence of aortocoronary bypass graft: Secondary | ICD-10-CM

## 2014-06-06 DIAGNOSIS — I272 Other secondary pulmonary hypertension: Secondary | ICD-10-CM | POA: Diagnosis present

## 2014-06-06 DIAGNOSIS — K5520 Angiodysplasia of colon without hemorrhage: Secondary | ICD-10-CM | POA: Diagnosis not present

## 2014-06-06 DIAGNOSIS — G40909 Epilepsy, unspecified, not intractable, without status epilepticus: Secondary | ICD-10-CM | POA: Diagnosis present

## 2014-06-06 DIAGNOSIS — Z5900 Homelessness unspecified: Secondary | ICD-10-CM

## 2014-06-06 DIAGNOSIS — M79605 Pain in left leg: Secondary | ICD-10-CM

## 2014-06-06 DIAGNOSIS — I5043 Acute on chronic combined systolic (congestive) and diastolic (congestive) heart failure: Secondary | ICD-10-CM | POA: Diagnosis present

## 2014-06-06 DIAGNOSIS — D6489 Other specified anemias: Secondary | ICD-10-CM

## 2014-06-06 DIAGNOSIS — N189 Chronic kidney disease, unspecified: Secondary | ICD-10-CM | POA: Diagnosis not present

## 2014-06-06 DIAGNOSIS — R195 Other fecal abnormalities: Secondary | ICD-10-CM

## 2014-06-06 DIAGNOSIS — I255 Ischemic cardiomyopathy: Secondary | ICD-10-CM | POA: Diagnosis present

## 2014-06-06 DIAGNOSIS — M79604 Pain in right leg: Secondary | ICD-10-CM | POA: Diagnosis present

## 2014-06-06 DIAGNOSIS — Z9119 Patient's noncompliance with other medical treatment and regimen: Secondary | ICD-10-CM | POA: Diagnosis present

## 2014-06-06 DIAGNOSIS — D509 Iron deficiency anemia, unspecified: Secondary | ICD-10-CM | POA: Diagnosis present

## 2014-06-06 DIAGNOSIS — K449 Diaphragmatic hernia without obstruction or gangrene: Secondary | ICD-10-CM | POA: Diagnosis present

## 2014-06-06 DIAGNOSIS — F1910 Other psychoactive substance abuse, uncomplicated: Secondary | ICD-10-CM | POA: Diagnosis present

## 2014-06-06 LAB — CBC WITH DIFFERENTIAL/PLATELET
BASOS ABS: 0 10*3/uL (ref 0.0–0.1)
Basophils Relative: 1 % (ref 0–1)
EOS ABS: 0.1 10*3/uL (ref 0.0–0.7)
EOS PCT: 2 % (ref 0–5)
HCT: 22.2 % — ABNORMAL LOW (ref 39.0–52.0)
Hemoglobin: 6.6 g/dL — CL (ref 13.0–17.0)
LYMPHS ABS: 0.6 10*3/uL — AB (ref 0.7–4.0)
LYMPHS PCT: 12 % (ref 12–46)
MCH: 24.8 pg — AB (ref 26.0–34.0)
MCHC: 29.7 g/dL — AB (ref 30.0–36.0)
MCV: 83.5 fL (ref 78.0–100.0)
MONO ABS: 0.6 10*3/uL (ref 0.1–1.0)
Monocytes Relative: 11 % (ref 3–12)
NEUTROS ABS: 4 10*3/uL (ref 1.7–7.7)
NEUTROS PCT: 75 % (ref 43–77)
PLATELETS: 407 10*3/uL — AB (ref 150–400)
RBC: 2.66 MIL/uL — AB (ref 4.22–5.81)
RDW: 17.4 % — ABNORMAL HIGH (ref 11.5–15.5)
WBC: 5.3 10*3/uL (ref 4.0–10.5)

## 2014-06-06 LAB — RAPID URINE DRUG SCREEN, HOSP PERFORMED
Amphetamines: NOT DETECTED
Barbiturates: NOT DETECTED
Benzodiazepines: POSITIVE — AB
Cocaine: POSITIVE — AB
OPIATES: POSITIVE — AB
TETRAHYDROCANNABINOL: NOT DETECTED

## 2014-06-06 LAB — BASIC METABOLIC PANEL
Anion gap: 10 (ref 5–15)
BUN: 23 mg/dL (ref 6–23)
CALCIUM: 8.6 mg/dL (ref 8.4–10.5)
CO2: 21 mmol/L (ref 19–32)
Chloride: 102 mmol/L (ref 96–112)
Creatinine, Ser: 1.32 mg/dL (ref 0.50–1.35)
GFR, EST AFRICAN AMERICAN: 61 mL/min — AB (ref >90)
GFR, EST NON AFRICAN AMERICAN: 53 mL/min — AB (ref >90)
Glucose, Bld: 90 mg/dL (ref 70–99)
Potassium: 4.5 mmol/L (ref 3.5–5.1)
Sodium: 133 mmol/L — ABNORMAL LOW (ref 135–145)

## 2014-06-06 LAB — HEMOGLOBIN AND HEMATOCRIT, BLOOD
HCT: 22.1 % — ABNORMAL LOW (ref 39.0–52.0)
Hemoglobin: 6.5 g/dL — CL (ref 13.0–17.0)

## 2014-06-06 LAB — RETICULOCYTES
RBC.: 2.56 MIL/uL — ABNORMAL LOW (ref 4.22–5.81)
RETIC COUNT ABSOLUTE: 69.1 10*3/uL (ref 19.0–186.0)
Retic Ct Pct: 2.7 % (ref 0.4–3.1)

## 2014-06-06 LAB — TROPONIN I: Troponin I: <0.03 ng/mL (ref <0.031)

## 2014-06-06 LAB — IRON AND TIBC
Iron: 10 ug/dL — ABNORMAL LOW (ref 42–165)
SATURATION RATIOS: 3 % — AB (ref 20–55)
TIBC: 397 ug/dL (ref 215–435)
UIBC: 387 ug/dL (ref 125–400)

## 2014-06-06 LAB — CK: CK TOTAL: 38 U/L (ref 7–232)

## 2014-06-06 LAB — PREPARE RBC (CROSSMATCH)

## 2014-06-06 LAB — PHENYTOIN LEVEL, TOTAL: Phenytoin Lvl: <2.5 ug/mL — ABNORMAL LOW (ref 10.0–20.0)

## 2014-06-06 LAB — OCCULT BLOOD X 1 CARD TO LAB, STOOL: FECAL OCCULT BLD: POSITIVE — AB

## 2014-06-06 LAB — MRSA PCR SCREENING: MRSA BY PCR: POSITIVE — AB

## 2014-06-06 LAB — BRAIN NATRIURETIC PEPTIDE: B NATRIURETIC PEPTIDE 5: 1732.2 pg/mL — AB (ref 0.0–100.0)

## 2014-06-06 MED ORDER — ONDANSETRON 4 MG PO TBDP
4.0000 mg | ORAL_TABLET | Freq: Once | ORAL | Status: AC
  Administered 2014-06-06: 4 mg via ORAL
  Filled 2014-06-06: qty 1

## 2014-06-06 MED ORDER — PRAVASTATIN SODIUM 40 MG PO TABS
40.0000 mg | ORAL_TABLET | Freq: Every day | ORAL | Status: DC
  Administered 2014-06-06 – 2014-06-08 (×3): 40 mg via ORAL
  Filled 2014-06-06 (×4): qty 1

## 2014-06-06 MED ORDER — HYDROCODONE-ACETAMINOPHEN 5-325 MG PO TABS: 1.0000 | ORAL_TABLET | ORAL | Status: DC | PRN

## 2014-06-06 MED ORDER — HYDROCODONE-ACETAMINOPHEN 5-325 MG PO TABS
1.0000 | ORAL_TABLET | Freq: Once | ORAL | Status: AC
  Administered 2014-06-06: 1 via ORAL
  Filled 2014-06-06: qty 1

## 2014-06-06 MED ORDER — GABAPENTIN 100 MG PO CAPS
100.0000 mg | ORAL_CAPSULE | Freq: Two times a day (BID) | ORAL | Status: DC
  Administered 2014-06-06 – 2014-06-08 (×3): 100 mg via ORAL
  Filled 2014-06-06 (×5): qty 1

## 2014-06-06 MED ORDER — ACETAMINOPHEN 325 MG PO TABS: 650.0000 mg | ORAL_TABLET | Freq: Four times a day (QID) | ORAL | Status: DC | PRN

## 2014-06-06 MED ORDER — SODIUM CHLORIDE 0.9 % IV SOLN: Freq: Once | INTRAVENOUS | Status: DC

## 2014-06-06 MED ORDER — PANTOPRAZOLE SODIUM 40 MG PO TBEC
40.0000 mg | DELAYED_RELEASE_TABLET | Freq: Every day | ORAL | Status: DC
  Administered 2014-06-06 – 2014-06-09 (×4): 40 mg via ORAL
  Filled 2014-06-06 (×3): qty 1

## 2014-06-06 MED ORDER — OXYCODONE HCL 5 MG PO TABS: 5.0000 mg | ORAL_TABLET | ORAL | Status: DC | PRN

## 2014-06-06 MED ORDER — FUROSEMIDE 40 MG PO TABS
40.0000 mg | ORAL_TABLET | Freq: Two times a day (BID) | ORAL | Status: DC
  Administered 2014-06-06 – 2014-06-07 (×3): 40 mg via ORAL
  Filled 2014-06-06 (×6): qty 1

## 2014-06-06 MED ORDER — ACETAMINOPHEN 650 MG RE SUPP: 650.0000 mg | Freq: Four times a day (QID) | RECTAL | Status: DC | PRN

## 2014-06-06 MED ORDER — LISINOPRIL 2.5 MG PO TABS
2.5000 mg | ORAL_TABLET | Freq: Every day | ORAL | Status: DC
Start: 2014-06-06 — End: 2014-06-09
  Administered 2014-06-06 – 2014-06-09 (×4): 2.5 mg via ORAL
  Filled 2014-06-06 (×4): qty 1

## 2014-06-06 MED ORDER — MUPIROCIN 2 % EX OINT
1.0000 "application " | TOPICAL_OINTMENT | Freq: Two times a day (BID) | CUTANEOUS | Status: DC
  Administered 2014-06-07 – 2014-06-09 (×6): 1 via NASAL
  Filled 2014-06-06: qty 22

## 2014-06-06 MED ORDER — ONDANSETRON HCL 4 MG PO TABS: 4.0000 mg | ORAL_TABLET | Freq: Four times a day (QID) | ORAL | Status: DC | PRN

## 2014-06-06 MED ORDER — PHENYTOIN 50 MG PO CHEW: 300.0000 mg | CHEWABLE_TABLET | Freq: Every day | ORAL | Status: DC

## 2014-06-06 MED ORDER — PHENYTOIN SODIUM EXTENDED 100 MG PO CAPS
300.0000 mg | ORAL_CAPSULE | Freq: Every day | ORAL | Status: DC
  Administered 2014-06-06 – 2014-06-08 (×3): 300 mg via ORAL
  Filled 2014-06-06 (×4): qty 3

## 2014-06-06 MED ORDER — PHENYTOIN SODIUM EXTENDED 100 MG PO CAPS
200.0000 mg | ORAL_CAPSULE | Freq: Every day | ORAL | Status: DC
  Administered 2014-06-07 – 2014-06-09 (×3): 200 mg via ORAL
  Filled 2014-06-06 (×3): qty 2

## 2014-06-06 MED ORDER — AMLODIPINE BESYLATE 10 MG PO TABS
10.0000 mg | ORAL_TABLET | Freq: Every day | ORAL | Status: DC
  Administered 2014-06-06 – 2014-06-09 (×4): 10 mg via ORAL
  Filled 2014-06-06 (×4): qty 1

## 2014-06-06 MED ORDER — FERROUS SULFATE 325 (65 FE) MG PO TABS
325.0000 mg | ORAL_TABLET | Freq: Two times a day (BID) | ORAL | Status: DC
  Administered 2014-06-06 – 2014-06-09 (×6): 325 mg via ORAL
  Filled 2014-06-06 (×8): qty 1

## 2014-06-06 MED ORDER — CHLORHEXIDINE GLUCONATE CLOTH 2 % EX PADS
6.0000 | MEDICATED_PAD | Freq: Every day | CUTANEOUS | Status: DC
  Administered 2014-06-07 – 2014-06-09 (×2): 6 via TOPICAL

## 2014-06-06 MED ORDER — PHENYTOIN SODIUM EXTENDED 100 MG PO CAPS: 200.0000 mg | ORAL_CAPSULE | Freq: Every morning | ORAL | Status: DC

## 2014-06-06 MED ORDER — ALBUTEROL SULFATE (2.5 MG/3ML) 0.083% IN NEBU
3.0000 mL | INHALATION_SOLUTION | Freq: Four times a day (QID) | RESPIRATORY_TRACT | Status: DC | PRN
Start: 2014-06-06 — End: 2014-06-09

## 2014-06-06 MED ORDER — CLONIDINE HCL 0.1 MG PO TABS
0.1000 mg | ORAL_TABLET | Freq: Two times a day (BID) | ORAL | Status: DC
  Administered 2014-06-07 – 2014-06-09 (×6): 0.1 mg via ORAL
  Filled 2014-06-06 (×7): qty 1

## 2014-06-06 MED ORDER — ONDANSETRON HCL 4 MG/2ML IJ SOLN: 4.0000 mg | Freq: Four times a day (QID) | INTRAMUSCULAR | Status: DC | PRN

## 2014-06-06 MED ORDER — FUROSEMIDE 10 MG/ML IJ SOLN
40.0000 mg | Freq: Once | INTRAMUSCULAR | Status: AC
  Administered 2014-06-07: 40 mg via INTRAVENOUS
  Filled 2014-06-06: qty 4

## 2014-06-06 MED ORDER — SODIUM CHLORIDE 0.9 % IJ SOLN
3.0000 mL | Freq: Two times a day (BID) | INTRAMUSCULAR | Status: DC
  Administered 2014-06-06 – 2014-06-08 (×4): 3 mL via INTRAVENOUS

## 2014-06-06 MED ORDER — HYDRALAZINE HCL 50 MG PO TABS
50.0000 mg | ORAL_TABLET | Freq: Three times a day (TID) | ORAL | Status: DC
  Administered 2014-06-07 – 2014-06-09 (×8): 50 mg via ORAL
  Filled 2014-06-06 (×10): qty 1

## 2014-06-06 MED ORDER — ALUM & MAG HYDROXIDE-SIMETH 200-200-20 MG/5ML PO SUSP: 30.0000 mL | Freq: Four times a day (QID) | ORAL | Status: DC | PRN

## 2014-06-06 MED ORDER — CITALOPRAM HYDROBROMIDE 20 MG PO TABS
20.0000 mg | ORAL_TABLET | Freq: Every day | ORAL | Status: DC
  Administered 2014-06-06 – 2014-06-09 (×4): 20 mg via ORAL
  Filled 2014-06-06 (×4): qty 1

## 2014-06-06 MED ORDER — NITROGLYCERIN 0.4 MG SL SUBL: 0.4000 mg | SUBLINGUAL_TABLET | SUBLINGUAL | Status: DC | PRN

## 2014-06-06 MED ORDER — PHENYTOIN SODIUM EXTENDED 100 MG PO CAPS: 200.0000 mg | ORAL_CAPSULE | Freq: Two times a day (BID) | ORAL | Status: DC

## 2014-06-06 MED ORDER — MORPHINE SULFATE 2 MG/ML IJ SOLN
1.0000 mg | INTRAMUSCULAR | Status: DC | PRN
  Administered 2014-06-06 – 2014-06-09 (×11): 1 mg via INTRAVENOUS
  Filled 2014-06-06 (×11): qty 1

## 2014-06-06 MED ORDER — POLYETHYLENE GLYCOL 3350 17 G PO PACK
17.0000 g | PACK | Freq: Every day | ORAL | Status: DC | PRN
  Filled 2014-06-06: qty 1

## 2014-06-06 MED ORDER — ISOSORBIDE MONONITRATE ER 30 MG PO TB24
30.0000 mg | ORAL_TABLET | Freq: Every day | ORAL | Status: DC
  Administered 2014-06-06 – 2014-06-09 (×4): 30 mg via ORAL
  Filled 2014-06-06 (×4): qty 1

## 2014-06-06 MED ORDER — DOCUSATE SODIUM 100 MG PO CAPS
100.0000 mg | ORAL_CAPSULE | Freq: Two times a day (BID) | ORAL | Status: DC
  Administered 2014-06-08: 100 mg via ORAL
  Filled 2014-06-06 (×6): qty 1

## 2014-06-06 NOTE — Progress Notes (Signed)
Pt admitted to the unit at 1420. Pt mental status is A&OX4. Pt oriented to room, staff, and call bell. Skin is dry,flaky with dark spots all over skin (old scratchs,scabs). Full assessment charted in [facility]. Call bell within reach. Visitor guidelines reviewed w/ pt and/or family.

## 2014-06-06 NOTE — Progress Notes (Signed)
69 year old male with h/o chronic diastolic heart failure, coming in for worsening pedal edema, sob and a fall, with generalized weakness. He was seen at [facility] and was found to have hemoglobin around 6 with guiac positive stools. He is hemodynamically stable and request transfer to [facility] for prbc transfusion and [facility] consultation.   Hosie Poisson, MD [phone]

## 2014-06-06 NOTE — ED Notes (Signed)
Hgb of 6.6 reported to [facility] and Dr. Dina Rich

## 2014-06-06 NOTE — ED Notes (Signed)
Patient c/o bilateral leg pain that extends from hips to knees & states that the pain mad him fall at home early this morning, c/o neck and lower back pain after the the fall.

## 2014-06-06 NOTE — ED Notes (Signed)
Repeat Hgb of 6.5-results to [employer]

## 2014-06-06 NOTE — H&P (Signed)
[facility] History and Physical                                                                                    Charles [name], is a 69 y.o. male  MRN: [id number]   DOB - May 10, 1945  Admit Date - 06/06/2014  Outpatient Primary MD for the patient is Charles [name]  With History of -  Past Medical History  Diagnosis Date  . Iron deficiency anemia   . Chronic combined systolic and diastolic CHF, NYHA class 2     a. 11/2013 Echo: EF 40-45%, basl-mid inflat AK, Gr 2 DD.  [name] Hyperlipidemia LDL goal <70   . Obstructive sleep apnea     a. not on home CPAP  . Ischemic cardiomyopathy     a. 11/2013 Echo: EF 40-45%, basl-mid inflat AK, Gr 2 DD, mild AI, mod dil LA/RA, mod reduced RV fxn, PASP 62mmHg.  [name] Homelessness   . Moderate to severe pulmonary hypertension 03/2010    a. PA peak pressure of 76 mmHg (per 2D Echo 03/2010)  . Polysubstance abuse     a. cocaine, THC  . AV malformation of gastrointestinal tract     a. w/ h/o GIB.  [name] Family history of early CAD   . DDD (degenerative disc disease), lumbar   . Peripheral vascular disease   . Seizure disorder   . Hypertension   . CAD (coronary artery disease)     a. s/p 3-vessel CABG (12/2007) // 100% RCA stenosis with collaterals from left system. Severe bifurcation lesions of proxima CXA and OM. Moderate LAD disease - followed by Dr. Wyline Copas in [facility];  b. 11/2013 Myoview: Large fixed inf defect w/o ischemia, EF 35%.  . Diabetes   . Chronic kidney disease (CKD), stage III (moderate)     a. BL SCr 1.5-1.6  . Renal artery stenosis   . Seizures       Past Surgical History  Procedure Laterality Date  . Apc  03/2010    To treat small bowel AVMs  . Esophagogastroduodenoscopy N/A 08/14/2012    Procedure: ESOPHAGOGASTRODUODENOSCOPY (EGD);  Surgeon: [name], MD;  Location: [facility] ENDOSCOPY;  Service: Endoscopy;  Laterality: N/A;  . Hot hemostasis N/A 08/14/2012    Procedure: HOT HEMOSTASIS (ARGON PLASMA COAGULATION/BICAP);   Surgeon: [name], MD;  Location: [facility] ENDOSCOPY;  Service: Endoscopy;  Laterality: N/A;  . Esophagogastroduodenoscopy (egd) with propofol N/A 08/04/2013    Procedure: ESOPHAGOGASTRODUODENOSCOPY (EGD) WITH PROPOFOL;  Surgeon: [name], MD;  Location: [facility] ENDOSCOPY;  Service: Endoscopy;  Laterality: N/A;  . Coronary artery bypass graft  12/2007    in for   Chief Complaint  Patient presents with  . Fall     HPI 69 year old male patient with multiple medical problems including combined systolic and diastolic heart failure, known iron deficiency anemia, history of prior GI bleed in setting of gastric AVMs, CAD status post CABG, chronic kidney disease stage III, pulmonary hypertension, polysubstance abuse and history of homelessness. He was recently discharged from this facility on 3/7 after being treated for acute on chronic combined systolic and diastolic heart failure. During that admission his hemoglobin had decreased  to 7.4 and he was given 1 unit of packed red blood cells. At time of discharge hemoglobin was 8.3. Returns to the ER with complaints of fall. This patient also has chronic back pain and presented with complaints of back pain and aggressive bilateral leg pain that pain and cough. He reports that he fell because his legs were hurting. He states he has developed a cough which he typically equates with evolution of heart failure exacerbation. He denied any constitutional symptoms such as fevers or chills. He's noticed some increased dyspnea on exertion but no chest pain. Patient had similar symptoms when he presented for the previous admission.  Patient was seen after arrival to this facility as a direct admission. He was in no acute distress. He was reporting the above symptoms. He reports that for several months that his legs have a sensation of giving out starting at the hips radiating to the calves and states these muscles feel retired and achy all the time. He also is reporting  low back pain and is requesting medication for pain. He denied red or black stools to me but later told the attending MD he had been having dark stools. He denies orthopnea, he reports shortness of breath occurring in "spells". On exam it was noted that he had house arrest bracelet on his right ankle; states he does need to notify the police that he has been admitted to the hospital. Laboratory data from the ER reveals sodium 133, normal renal function for this patient, BNP 1732, normal troponin, hemoglobin of 6.6 with hematocrit of 22.2 and a platelet count of 407,000. Stool was Hemoccult positive. Chest x-ray with moderate vascular congestion. CT cervical spine as well as plain x-ray lumbar spine without evidence of acute traumatic injury post fall  Review of Systems   In addition to the HPI above,  No Fever-chills, myalgias or other constitutional symptoms No Headache, changes with Vision or hearing, new weakness, tingling, numbness in any extremity, No problems swallowing food or Liquids, indigestion/reflux No Chest pain,  palpitations, orthopnea  No Abdominal pain, N/V; no melena or hematochezia, no dark tarry stools, Bowel movements are regular, No dysuria, hematuria or flank pain No new skin rashes, lesions, masses or bruises, No new joints pains-aches No recent weight gain or loss No polyuria, polydypsia or polyphagia,  *A full 10 point Review of Systems was done, except as stated above, all other Review of Systems were negative.  Social History History  Substance Use Topics  . Smoking status: Current Some Day Smoker -- 0.25 packs/day for 56 years    Types: Cigarettes  . Smokeless tobacco: Never Used  . Alcohol Use: Yes     Comment: occasionally drinks, a few times a month    Family History Family History  Problem Relation Age of Onset  . Heart disease Mother     unknown type  . Heart disease Father 2    died of MI at 14yo  . Heart disease Paternal Grandfather 79    died  of MI  . Heart disease    . Heart disease Brother 30    Prior to Admission medications   Medication Sig Start Date End Date Taking? Authorizing Provider  albuterol (PROVENTIL HFA;VENTOLIN HFA) 108 (90 BASE) MCG/ACT inhaler Inhale 2 puffs into the lungs every 6 (six) hours as needed for wheezing or shortness of breath. 04/30/14   Thurnell Lose, MD  amLODipine (NORVASC) 10 MG tablet Take 10 mg by mouth daily.  Historical Provider, MD  aspirin EC 81 MG tablet Take 81 mg by mouth daily.    Historical Provider, MD  citalopram (CELEXA) 20 MG tablet Take 20 mg by mouth daily.    Historical Provider, MD  cloNIDine (CATAPRES) 0.2 MG tablet Take 0.5 tablets (0.1 mg total) by mouth 2 (two) times daily. 02/09/14   Modena Jansky, MD  ferrous sulfate 325 (65 FE) MG tablet Take 325 mg by mouth 2 (two) times daily with a meal.     Historical Provider, MD  furosemide (LASIX) 80 MG tablet Take 0.5 tablets (40 mg total) by mouth 2 (two) times daily. 05/25/14   Belkys A Regalado, MD  gabapentin (NEURONTIN) 100 MG capsule Take 100 mg by mouth 2 (two) times daily.    Historical Provider, MD  hydrALAZINE (APRESOLINE) 50 MG tablet Take 1 tablet (50 mg total) by mouth 3 (three) times daily. 05/25/14   Belkys A Regalado, MD  isosorbide mononitrate (IMDUR) 30 MG 24 hr tablet Take 30 mg by mouth daily.    Historical Provider, MD  lisinopril (PRINIVIL,ZESTRIL) 2.5 MG tablet Take 1 tablet (2.5 mg total) by mouth daily. 05/25/14   Belkys A Regalado, MD  nitroGLYCERIN (NITROSTAT) 0.4 MG SL tablet Place 1 tablet (0.4 mg total) under the tongue every 5 (five) minutes as needed for chest pain (upto max 3 doses at one time.). 02/09/14   Modena Jansky, MD  pantoprazole (PROTONIX) 40 MG tablet Take 40 mg by mouth daily.     Historical Provider, MD  phenytoin (DILANTIN) 100 MG ER capsule Take 200-300 mg by mouth 2 (two) times daily. 200mg  in the morning, 300mg  in the evening    Historical Provider, MD  pravastatin (PRAVACHOL) 40  MG tablet Take 1 tablet (40 mg total) by mouth daily at 6 PM. 02/09/14   Modena Jansky, MD    Allergies  Allergen Reactions  . Motrin [Ibuprofen] Other (See Comments)    Affects kidneys  . Tylenol [Acetaminophen] Other (See Comments)    Affects kidneys    Physical Exam  Vitals  Blood pressure 153/81, pulse 88, temperature 97.7 F (36.5 C), temperature source Oral, resp. rate 20, height 5\' 5"  (1.651 m), weight 170 lb (77.111 kg), SpO2 92 %.   General:  In no acute distress, appears healthy and well nourished  Psych:  Normal affect, Denies Suicidal or Homicidal ideations, Awake Alert, Oriented X 3. Speech and thought patterns are clear and appropriate, no apparent short term memory deficits  Neuro:   No focal neurological deficits, CN II through XII intact, Strength 5/5 all 4 extremities, Sensation intact all 4 extremities.  ENT:  Ears and Eyes appear Normal, Conjunctivae clear, PER. Moist oral mucosa without erythema or exudates.  Neck:  Supple, No lymphadenopathy appreciated  Respiratory:  Symmetrical chest wall movement, Good air movement bilaterally, CTAB. Room Air  Cardiac:  RRR, No Murmurs, no LE edema noted, no JVD, No carotid bruits, peripheral pulses palpable at 2+  Abdomen:  Positive bowel sounds, Soft, Non tender, Non distended,  No masses appreciated, no obvious hepatosplenomegaly  Skin:  No Cyanosis, Normal Skin Turgor, No Skin Rash or Bruise.  Extremities: Symmetrical without obvious trauma or injury,  no effusions.  Data Review  CBC  Recent Labs Lab 06/06/14 0835 06/06/14 0950  WBC 5.3  --   HGB 6.6* 6.5*  HCT 22.2* 22.1*  PLT 407*  --   MCV 83.5  --   MCH 24.8*  --   MCHC  29.7*  --   RDW 17.4*  --   LYMPHSABS 0.6*  --   MONOABS 0.6  --   EOSABS 0.1  --   BASOSABS 0.0  --     Chemistries   Recent Labs Lab 06/06/14 0835  NA 133*  K 4.5  CL 102  CO2 21  GLUCOSE 90  BUN 23  CREATININE 1.32  CALCIUM 8.6    estimated creatinine  clearance is 49.9 mL/min (by C-G formula based on Cr of 1.32).  No results for input(s): TSH, T4TOTAL, T3FREE, THYROIDAB in the last 72 hours.  Invalid input(s): FREET3  Coagulation profile No results for input(s): INR, PROTIME in the last 168 hours.  No results for input(s): DDIMER in the last 72 hours.  Cardiac Enzymes  Recent Labs Lab 06/06/14 0835  TROPONINI <0.03    Invalid input(s): POCBNP  Urinalysis    Component Value Date/Time   COLORURINE YELLOW 03/08/2013 2136   APPEARANCEUR CLEAR 03/08/2013 2136   LABSPEC 1.025 03/08/2013 2136   PHURINE 5.0 03/08/2013 2136   GLUCOSEU NEGATIVE 03/08/2013 2136   HGBUR NEGATIVE 03/08/2013 2136   BILIRUBINUR NEGATIVE 03/08/2013 2136   KETONESUR NEGATIVE 03/08/2013 2136   PROTEINUR >300* 03/08/2013 2136   UROBILINOGEN 1.0 03/08/2013 2136   NITRITE NEGATIVE 03/08/2013 2136   LEUKOCYTESUR NEGATIVE 03/08/2013 2136    Imaging results:   Dg Chest 2 View  06/06/2014   CLINICAL DATA:  Patient fell at home twice this morning, history of hypertension coronary artery disease diabetes  EXAM: CHEST  2 VIEW  COMPARISON:  05/20/2014  FINDINGS: Severe cardiac enlargement stable. Status post prior CABG. Moderate vascular congestion. Mild interstitial prominence. No consolidation or effusion. Osseous thorax intact.  IMPRESSION: Similar to prior study there is evidence of congestive heart failure.   Electronically Signed   By: Skipper Cliche M.D.   On: 06/06/2014 08:44   Dg Lumbar Spine Complete  06/06/2014   CLINICAL DATA:  Recent falls with low back pain and radiculopathy  EXAM: LUMBAR SPINE - COMPLETE 4+ VIEW  COMPARISON:  None.  FINDINGS: Five lumbar type vertebral bodies are well visualized. Vertebral body height is well maintained. No spondylolysis or spondylolisthesis is seen. Osteophytic changes are noted with disc space narrowing at L4-5 and to a lesser degree at L3-4. Diffuse aortic calcifications are noted without aneurysmal dilatation.   IMPRESSION: Degenerative change without acute abnormality.   Electronically Signed   By: Inez Catalina M.D.   On: 06/06/2014 08:45   Ct Cervical Spine Wo Contrast  06/06/2014   CLINICAL DATA:  Recent fall with neck pain, initial encounter  EXAM: CT CERVICAL SPINE WITHOUT CONTRAST  TECHNIQUE: Multidetector CT imaging of the cervical spine was performed without intravenous contrast. Multiplanar CT image reconstructions were also generated.  COMPARISON:  None.  FINDINGS: Seven cervical segments are well visualized. Mild osteophytic changes are noted particularly at C5-6 and C6-7. Facet hypertrophic changes are noted. No findings to suggest acute fracture or acute facet abnormality are noted. Heavy carotid calcifications are noted bilaterally.  IMPRESSION: Multilevel degenerative change without acute abnormality.   Electronically Signed   By: Inez Catalina M.D.   On: 06/06/2014 09:00   Dg Chest Port 1 View  05/20/2014   CLINICAL DATA:  Cough and weakness, known heart failure  EXAM: PORTABLE CHEST - 1 VIEW  COMPARISON:  04/26/2014  FINDINGS: Cardiac shadow remains enlarged. Postsurgical changes are seen. Vascular congestion with pulmonary edema is noted consistent with CHF. No bony abnormality is noted.  IMPRESSION: Changes consistent with CHF.   Electronically Signed   By: Inez Catalina M.D.   On: 05/20/2014 10:03     EKG: Sinus rhythm without any acute ischemic changes   Assessment & Plan  Principal Problem:   Symptomatic anemia/ Iron deficiency anemia/history of gastric AVMs -Admit to telemetry -Continue to check stools for blood -Gastroenterology service note reviewed-plan for formal consultation 3/20 but also requested that we notify them if patient becomes unstable with signs of active bleeding -Obtain anemia panel -Transfuse 2 units packed red blood cells -Continue oral iron -Patient without any obvious bleeding per his report; requesting to be fed so until evaluated by GI in the next 24 hours  will allow solid diet-patient aware this may delay some of his GI evaluation  Active Problems:   Acute on chronic combined systolic and diastolic heart failure/ICM EF 40% -Mildly decompensated as evidenced by abnormal chest x-ray and mild increase in dyspnea on exertion -Suspect etiology to exacerbation related to symptomatic anemia and ischemic stressors to heart from low hemoglobin -Treat underlying causes i.e. Transfuse -Continue usual Lasix-may need to adjust to IV route and increased dosing if symptoms do not improve in next 24 hours   Nonmechanical falls -Suspect related to ongoing leg pain that patient has been reporting chronically as well as likely relative orthostasis in patient with symptomatic anemia -We'll eventually need physical therapy evaluation    Hypertension -Blood pressure actually somewhat elevated -Continue home medications: Norvasc, Catapres, Lasix, Apresoline, Imdur, and Zestril    Moderate to severe pulmonary hypertension -Continue nitrates and hydralazine    Seizure disorder -Continue Dilantin -Check Dilantin level    Homelessness/ Polysubstance abuse -Check urine drug screen; he was given Vicodin twice at the ER -Unclear if patient still homelessness we'll need to explore prior to discharge    DDD, lumbar/Leg pain, bilateral -Chronic pain ongoing with patient exhibiting drug-seeking behavior -We'll allow for low-dose Vicodin but not normally on narcotics at home -Check CPK -Check lower extremity venous duplex especially with recent hospital admission i.e. risk for DVT    DVT Prophylaxis: SCDs  Family Communication:   No family at bedside  Code Status: Full code   Condition:  Stable  Time spent in minutes : 60   Bradley Handyside L. ANP on 06/06/2014 at 3:35 PM  Between 7am to 7pm - Pager - [phone]  After 7pm go to [email or web address] - password [email or web address]  And look for the night coverage person covering me after hours  [facility]

## 2014-06-06 NOTE — ED Provider Notes (Signed)
CSN: [id number]     Arrival date & time 06/06/14  5956 History   First MD Initiated Contact with Patient 06/06/14 [phone]     Chief Complaint  Patient presents with  . Fall     (Consider location/radiation/quality/duration/timing/severity/associated sxs/prior Treatment) HPI \\ This is a 69 year old male who presents with back pain, leg pain, neck pain and cough. Reports progressively worsening bilateral lower extremity pain over the last year. He reports weakness secondary to pain. Feels that may be related to remote back injury. States that the pain was so bad this morning that he fell down the steps. He did not hit his head or lose consciousness. After the fall he had neck discomfort as well.  After the fall he states that he thinks he passed out. He denies any weakness, numbness, or tingling of the legs or upper extremities.  Patient reports a cough. He states "I only get a cough when fluid starts to build up from CHF." He reports cough for the last several days. Denies any fevers, chest pain, or shortness of breath.  Patient was seen with similar presentation on March 2 with the exception of the fall.  Past Medical History  Diagnosis Date  . Iron deficiency anemia   . Chronic combined systolic and diastolic CHF, NYHA class 2     a. 11/2013 Echo: EF 40-45%, basl-mid inflat AK, Gr 2 DD.  [name] Hyperlipidemia LDL goal <70   . Obstructive sleep apnea     a. not on home CPAP  . Ischemic cardiomyopathy     a. 11/2013 Echo: EF 40-45%, basl-mid inflat AK, Gr 2 DD, mild AI, mod dil LA/RA, mod reduced RV fxn, PASP 4mmHg.  [name] Homelessness   . Moderate to severe pulmonary hypertension 03/2010    a. PA peak pressure of 76 mmHg (per 2D Echo 03/2010)  . Polysubstance abuse     a. cocaine, THC  . AV malformation of gastrointestinal tract     a. w/ h/o GIB.  [name] Family history of early CAD   . DDD (degenerative disc disease), lumbar   . Peripheral vascular disease   . Seizure disorder   . Hypertension    . CAD (coronary artery disease)     a. s/p 3-vessel CABG (12/2007) // 100% RCA stenosis with collaterals from left system. Severe bifurcation lesions of proxima CXA and OM. Moderate LAD disease - followed by Dr. Wyline Copas in [facility];  b. 11/2013 Myoview: Large fixed inf defect w/o ischemia, EF 35%.  . Diabetes   . Chronic kidney disease (CKD), stage III (moderate)     a. BL SCr 1.5-1.6  . Renal artery stenosis   . Seizures    Past Surgical History  Procedure Laterality Date  . Apc  03/2010    To treat small bowel AVMs  . Esophagogastroduodenoscopy N/A 08/14/2012    Procedure: ESOPHAGOGASTRODUODENOSCOPY (EGD);  Surgeon: [name], MD;  Location: [facility] ENDOSCOPY;  Service: Endoscopy;  Laterality: N/A;  . Hot hemostasis N/A 08/14/2012    Procedure: HOT HEMOSTASIS (ARGON PLASMA COAGULATION/BICAP);  Surgeon: [name], MD;  Location: [facility] ENDOSCOPY;  Service: Endoscopy;  Laterality: N/A;  . Esophagogastroduodenoscopy (egd) with propofol N/A 08/04/2013    Procedure: ESOPHAGOGASTRODUODENOSCOPY (EGD) WITH PROPOFOL;  Surgeon: [name], MD;  Location: [facility] ENDOSCOPY;  Service: Endoscopy;  Laterality: N/A;  . Coronary artery bypass graft  12/2007   Family History  Problem Relation Age of Onset  . Heart disease Mother     unknown type  .  Heart disease Father 44    died of MI at 23yo  . Heart disease Paternal Grandfather 30    died of MI  . Heart disease    . Heart disease Brother 30   History  Substance Use Topics  . Smoking status: Current Some Day Smoker -- 0.25 packs/day for 56 years    Types: Cigarettes  . Smokeless tobacco: Never Used  . Alcohol Use: Yes     Comment: occasionally drinks, a few times a month    Review of Systems  Constitutional: Negative.  Negative for fever.  Respiratory: Positive for cough. Negative for chest tightness and shortness of breath.   Cardiovascular: Negative.  Negative for chest pain.  Gastrointestinal: Negative.  Negative for nausea, vomiting and  abdominal pain.  Genitourinary: Negative.  Negative for dysuria, decreased urine volume and difficulty urinating.  Musculoskeletal: Positive for back pain and neck pain.       Leg pain  Skin: Negative for rash.  Neurological: Positive for numbness. Negative for weakness and headaches.  All other systems reviewed and are negative.     Allergies  Motrin and Tylenol  Home Medications   Prior to Admission medications   Medication Sig Start Date End Date Taking? Authorizing Provider  albuterol (PROVENTIL HFA;VENTOLIN HFA) 108 (90 BASE) MCG/ACT inhaler Inhale 2 puffs into the lungs every 6 (six) hours as needed for wheezing or shortness of breath. 04/30/14   Thurnell Lose, MD  amLODipine (NORVASC) 10 MG tablet Take 10 mg by mouth daily.    Historical Provider, MD  aspirin EC 81 MG tablet Take 81 mg by mouth daily.    Historical Provider, MD  citalopram (CELEXA) 20 MG tablet Take 20 mg by mouth daily.    Historical Provider, MD  cloNIDine (CATAPRES) 0.2 MG tablet Take 0.5 tablets (0.1 mg total) by mouth 2 (two) times daily. 02/09/14   Modena Jansky, MD  ferrous sulfate 325 (65 FE) MG tablet Take 325 mg by mouth 2 (two) times daily with a meal.     Historical Provider, MD  furosemide (LASIX) 80 MG tablet Take 0.5 tablets (40 mg total) by mouth 2 (two) times daily. 05/25/14   Belkys A Regalado, MD  gabapentin (NEURONTIN) 100 MG capsule Take 100 mg by mouth 2 (two) times daily.    Historical Provider, MD  hydrALAZINE (APRESOLINE) 50 MG tablet Take 1 tablet (50 mg total) by mouth 3 (three) times daily. 05/25/14   Belkys A Regalado, MD  isosorbide mononitrate (IMDUR) 30 MG 24 hr tablet Take 30 mg by mouth daily.    Historical Provider, MD  lisinopril (PRINIVIL,ZESTRIL) 2.5 MG tablet Take 1 tablet (2.5 mg total) by mouth daily. 05/25/14   Belkys A Regalado, MD  nitroGLYCERIN (NITROSTAT) 0.4 MG SL tablet Place 1 tablet (0.4 mg total) under the tongue every 5 (five) minutes as needed for chest pain  (upto max 3 doses at one time.). 02/09/14   Modena Jansky, MD  pantoprazole (PROTONIX) 40 MG tablet Take 40 mg by mouth daily.     Historical Provider, MD  phenytoin (DILANTIN) 100 MG ER capsule Take 200-300 mg by mouth 2 (two) times daily. 200mg  in the morning, 300mg  in the evening    Historical Provider, MD  pravastatin (PRAVACHOL) 40 MG tablet Take 1 tablet (40 mg total) by mouth daily at 6 PM. 02/09/14   Modena Jansky, MD   BP 180/96 mmHg  Pulse 79  Temp(Src) 98 F (36.7 C)  Resp  27  Ht 5\' 5"  (1.651 m)  Wt 170 lb (77.111 kg)  BMI 28.29 kg/m2  SpO2 98% Physical Exam  Constitutional: He is oriented to person, place, and time. No distress.  HENT:  Head: Normocephalic and atraumatic.  Mouth/Throat: Oropharynx is clear and moist.  Eyes: EOM are normal. Pupils are equal, round, and reactive to light.  Neck: Neck supple.  Tenderness palpation of the bilateral paraspinous muscles and midline C-spine, C collar in place  Cardiovascular: Normal rate, regular rhythm and normal heart sounds.   No murmur heard. Pulmonary/Chest: Effort normal and breath sounds normal. No respiratory distress. He has no wheezes.  Abdominal: Soft. Bowel sounds are normal. There is no tenderness. There is no rebound.  Genitourinary:  Rectal tone, brown stool  Musculoskeletal: He exhibits no edema.  Trace bilateral lower extremity edema, bilateral positive straight leg raises  Neurological: He is alert and oriented to person, place, and time.  5 out of 5 strength bilateral dorsi and plantar flexion, patient non-participatory with further strength testing  Skin: Skin is warm and dry.  Psychiatric: He has a normal mood and affect.  Nursing note and vitals reviewed.   ED Course  Procedures (including critical care time) Labs Review Labs Reviewed  CBC WITH DIFFERENTIAL/PLATELET - Abnormal; Notable for the following:    RBC 2.66 (*)    Hemoglobin 6.6 (*)    HCT 22.2 (*)    MCH 24.8 (*)    MCHC 29.7  (*)    RDW 17.4 (*)    Platelets 407 (*)    Lymphs Abs 0.6 (*)    All other components within normal limits  BASIC METABOLIC PANEL - Abnormal; Notable for the following:    Sodium 133 (*)    GFR calc non Af Amer 53 (*)    GFR calc Af Amer 61 (*)    All other components within normal limits  BRAIN NATRIURETIC PEPTIDE - Abnormal; Notable for the following:    B Natriuretic Peptide 1732.2 (*)    All other components within normal limits  HEMOGLOBIN AND HEMATOCRIT, BLOOD - Abnormal; Notable for the following:    Hemoglobin 6.5 (*)    HCT 22.1 (*)    All other components within normal limits  OCCULT BLOOD X 1 CARD TO LAB, STOOL - Abnormal; Notable for the following:    Fecal Occult Bld POSITIVE (*)    All other components within normal limits  TROPONIN I    Imaging Review Dg Chest 2 View  06/06/2014   CLINICAL DATA:  Patient fell at home twice this morning, history of hypertension coronary artery disease diabetes  EXAM: CHEST  2 VIEW  COMPARISON:  05/20/2014  FINDINGS: Severe cardiac enlargement stable. Status post prior CABG. Moderate vascular congestion. Mild interstitial prominence. No consolidation or effusion. Osseous thorax intact.  IMPRESSION: Similar to prior study there is evidence of congestive heart failure.   Electronically Signed   By: Skipper Cliche M.D.   On: 06/06/2014 08:44   Dg Lumbar Spine Complete  06/06/2014   CLINICAL DATA:  Recent falls with low back pain and radiculopathy  EXAM: LUMBAR SPINE - COMPLETE 4+ VIEW  COMPARISON:  None.  FINDINGS: Five lumbar type vertebral bodies are well visualized. Vertebral body height is well maintained. No spondylolysis or spondylolisthesis is seen. Osteophytic changes are noted with disc space narrowing at L4-5 and to a lesser degree at L3-4. Diffuse aortic calcifications are noted without aneurysmal dilatation.  IMPRESSION: Degenerative change without acute abnormality.  Electronically Signed   By: Inez Catalina M.D.   On: 06/06/2014  08:45   Ct Cervical Spine Wo Contrast  06/06/2014   CLINICAL DATA:  Recent fall with neck pain, initial encounter  EXAM: CT CERVICAL SPINE WITHOUT CONTRAST  TECHNIQUE: Multidetector CT imaging of the cervical spine was performed without intravenous contrast. Multiplanar CT image reconstructions were also generated.  COMPARISON:  None.  FINDINGS: Seven cervical segments are well visualized. Mild osteophytic changes are noted particularly at C5-6 and C6-7. Facet hypertrophic changes are noted. No findings to suggest acute fracture or acute facet abnormality are noted. Heavy carotid calcifications are noted bilaterally.  IMPRESSION: Multilevel degenerative change without acute abnormality.   Electronically Signed   By: Inez Catalina M.D.   On: 06/06/2014 09:00     EKG Interpretation   Date/Time:  Saturday June 06 2014 08:33:25 EDT Ventricular Rate:  81 PR Interval:  182 QRS Duration: 112 QT Interval:  414 QTC Calculation: 480 R Axis:   72 Text Interpretation:  Sinus rhythm with occasional Premature ventricular  complexes Cannot rule out Inferior infarct , age undetermined Abnormal ECG  similar to prior Confirmed by HORTON  MD, Clemons ([id number]) on 06/06/2014  9:15:10 AM      MDM   Final diagnoses:  Chronic combined systolic and diastolic heart failure  Anemia due to other cause  Occult GI bleeding    Patient presents with bilateral lower extremity pain resulting in falls. Also reports shortness of breath and cough. Nontoxic on exam. Vital signs reassuring. No obvious signs of trauma. Films obtained. Chest x-ray, EKG, troponin also obtained. Recent admission for CHF exacerbation.  Workup notable for hemoglobin of 6.6. Patient's baseline is around 8.5. On reevaluation, patient does report dark stools over the last week. He has a history of a gastric AVM and has been evaluated by [facility].  Hemoccult positive. Given cardiac history, patient will likely need transfusion as his hematocrit is  less than 25. Discussed admission with the hospitalist and [facility] Janett Billow).  He will be admitted to [facility].   Merryl Hacker, MD 06/06/14 [phone]

## 2014-06-06 NOTE — ED Notes (Signed)
c-collar removed from patient pst radiology report and with MD approval

## 2014-06-06 NOTE — Progress Notes (Signed)
Patient currently at [facility], but being transferred to and admitted at [facility].  Went to ED for fall and complaining of SOB.  Hgb 6.6 grams.  Heme positive, but stool is brown.  Has extensive cardiac history so will be transfused.  Has history of AVM's with APC in the past.  Will see in consult likely 3/20.

## 2014-06-07 DIAGNOSIS — K5520 Angiodysplasia of colon without hemorrhage: Secondary | ICD-10-CM

## 2014-06-07 DIAGNOSIS — N189 Chronic kidney disease, unspecified: Secondary | ICD-10-CM

## 2014-06-07 DIAGNOSIS — D631 Anemia in chronic kidney disease: Secondary | ICD-10-CM

## 2014-06-07 DIAGNOSIS — R195 Other fecal abnormalities: Secondary | ICD-10-CM

## 2014-06-07 LAB — COMPREHENSIVE METABOLIC PANEL
ALBUMIN: 2.8 g/dL — AB (ref 3.5–5.2)
ALT: 21 U/L (ref 0–53)
AST: 45 U/L — ABNORMAL HIGH (ref 0–37)
Alkaline Phosphatase: 99 U/L (ref 39–117)
Anion gap: 11 (ref 5–15)
BUN: 24 mg/dL — AB (ref 6–23)
CHLORIDE: 101 mmol/L (ref 96–112)
CO2: 22 mmol/L (ref 19–32)
CREATININE: 1.45 mg/dL — AB (ref 0.50–1.35)
Calcium: 8.3 mg/dL — ABNORMAL LOW (ref 8.4–10.5)
GFR, EST AFRICAN AMERICAN: 55 mL/min — AB (ref >90)
GFR, EST NON AFRICAN AMERICAN: 47 mL/min — AB (ref >90)
Glucose, Bld: 131 mg/dL — ABNORMAL HIGH (ref 70–99)
Potassium: 4 mmol/L (ref 3.5–5.1)
SODIUM: 134 mmol/L — AB (ref 135–145)
TOTAL PROTEIN: 7.3 g/dL (ref 6.0–8.3)
Total Bilirubin: 1.7 mg/dL — ABNORMAL HIGH (ref 0.3–1.2)

## 2014-06-07 LAB — CBC
HEMATOCRIT: 26.2 % — AB (ref 39.0–52.0)
HEMOGLOBIN: 8 g/dL — AB (ref 13.0–17.0)
MCH: 25.7 pg — ABNORMAL LOW (ref 26.0–34.0)
MCHC: 30.5 g/dL (ref 30.0–36.0)
MCV: 84.2 fL (ref 78.0–100.0)
Platelets: 330 10*3/uL (ref 150–400)
RBC: 3.11 MIL/uL — AB (ref 4.22–5.81)
RDW: 16.2 % — ABNORMAL HIGH (ref 11.5–15.5)
WBC: 6.5 10*3/uL (ref 4.0–10.5)

## 2014-06-07 LAB — APTT: APTT: 40 s — AB (ref 24–37)

## 2014-06-07 LAB — HEMOGLOBIN AND HEMATOCRIT, BLOOD
HCT: 25.6 % — ABNORMAL LOW (ref 39.0–52.0)
HEMOGLOBIN: 8 g/dL — AB (ref 13.0–17.0)

## 2014-06-07 LAB — FERRITIN: FERRITIN: 46 ng/mL (ref 22–322)

## 2014-06-07 LAB — FOLATE

## 2014-06-07 LAB — VITAMIN B12: Vitamin B-12: 736 pg/mL (ref 211–911)

## 2014-06-07 LAB — PROTIME-INR
INR: 1.37 (ref 0.00–1.49)
Prothrombin Time: 17.1 seconds — ABNORMAL HIGH (ref 11.6–15.2)

## 2014-06-07 MED ORDER — ENSURE COMPLETE PO LIQD
237.0000 mL | Freq: Three times a day (TID) | ORAL | Status: DC
  Administered 2014-06-07 – 2014-06-09 (×4): 237 mL via ORAL

## 2014-06-07 NOTE — Progress Notes (Signed)
Second unit PRBC complete. Transfused approximately 320 mL. CBC order placed for 2 hours following transfusion. Patient tolerated well. Will continue to monitor.

## 2014-06-07 NOTE — Consult Note (Signed)
Referring Provider: No ref. provider found Primary Care Physician:  [name] Primary Gastroenterologist:  ?? Unassigned   Reason for Consultation:  Anemia and heme positive stools with history of AVM's  HPI: Charles [name] is a 69 y.o. male with multiple medical problems including combined systolic and diastolic heart failure, known iron deficiency anemia, history of prior GI bleed in setting of gastric AVMs requiring APC treatment, CAD status post CABG, chronic kidney disease stage III, pulmonary hypertension, polysubstance abuse, chronic back pain, and history of homelessness. He was recently discharged from this facility on 3/7 after being treated for acute on chronic combined systolic and diastolic heart failure. During that admission his hemoglobin had decreased to 7.4 and he was given 1 unit of packed red blood cells. At time of discharge hemoglobin was 8.3.  He returned to the ED at [facility] with complaints of fall at home because his legs gave out; he reports that his legs have been hurting a lot . He states he has developed a cough which he typically equates with evolution of heart failure exacerbation.  He also noticed some increased dyspnea on exertion, but no chest pain. Patient had similar symptoms when he presented for the previous admission.  He denied red or black stools initially but then admitted that they are usually a darker color; he is on iron supplements at home.  Then he says that on Monday they were very black like tar.  Was heme positive in the ED, but stool was reported brown at that time.  Hgb was 6.6 grams compared to 8.3 grams just two weeks ago.  Was transferred to [facility] for admission and transfusion of 2 units PRBC's, which he had received, but repeat Hgb is pending.  Denies any other GI complaints.  07/2013 small bowel enteroscopy by Dr. Fuller Plan showed one AVM in the antrum to which APC was applied; remainder of study was normal.  07/2012 EGD by Dr.  Collene Mares showed small patch of mucosa in the mid-body with spotty bleeding to which APC was applied.  03/2010 EGD by Dr. Benson Norway showed hiatal hernia  03/2010 colonoscopy by Dr. Benson Norway was normal except for internal hemorrhoids  Iron studies, folate, and Vitamin B 12 levels are pending.  Is on iron supplements 325 mg BID at home as well as pantoprazole 40 mg daily.   Past Medical History  Diagnosis Date  . Iron deficiency anemia   . Chronic combined systolic and diastolic CHF, NYHA class 2     a. 11/2013 Echo: EF 40-45%, basl-mid inflat AK, Gr 2 DD.  [name] Hyperlipidemia LDL goal <70   . Obstructive sleep apnea     a. not on home CPAP  . Ischemic cardiomyopathy     a. 11/2013 Echo: EF 40-45%, basl-mid inflat AK, Gr 2 DD, mild AI, mod dil LA/RA, mod reduced RV fxn, PASP 70mmHg.  [name] Homelessness   . Moderate to severe pulmonary hypertension 03/2010    a. PA peak pressure of 76 mmHg (per 2D Echo 03/2010)  . Polysubstance abuse     a. cocaine, THC  . AV malformation of gastrointestinal tract     a. w/ h/o GIB.  [name] Family history of early CAD   . DDD (degenerative disc disease), lumbar   . Peripheral vascular disease   . Seizure disorder   . Hypertension   . CAD (coronary artery disease)     a. s/p 3-vessel CABG (12/2007) // 100% RCA stenosis with collaterals from  left system. Severe bifurcation lesions of proxima CXA and OM. Moderate LAD disease - followed by Dr. Wyline Copas in [facility];  b. 11/2013 Myoview: Large fixed inf defect w/o ischemia, EF 35%.  . Diabetes   . Chronic kidney disease (CKD), stage III (moderate)     a. BL SCr 1.5-1.6  . Renal artery stenosis   . Seizures     Past Surgical History  Procedure Laterality Date  . Apc  03/2010    To treat small bowel AVMs  . Esophagogastroduodenoscopy N/A 08/14/2012    Procedure: ESOPHAGOGASTRODUODENOSCOPY (EGD);  Surgeon: [name], MD;  Location: [facility] ENDOSCOPY;  Service: Endoscopy;  Laterality: N/A;  . Hot hemostasis N/A 08/14/2012    Procedure:  HOT HEMOSTASIS (ARGON PLASMA COAGULATION/BICAP);  Surgeon: [name], MD;  Location: [facility] ENDOSCOPY;  Service: Endoscopy;  Laterality: N/A;  . Esophagogastroduodenoscopy (egd) with propofol N/A 08/04/2013    Procedure: ESOPHAGOGASTRODUODENOSCOPY (EGD) WITH PROPOFOL;  Surgeon: [name], MD;  Location: [facility] ENDOSCOPY;  Service: Endoscopy;  Laterality: N/A;  . Coronary artery bypass graft  12/2007    Prior to Admission medications   Medication Sig Start Date End Date Taking? Authorizing Provider  albuterol (PROVENTIL HFA;VENTOLIN HFA) 108 (90 BASE) MCG/ACT inhaler Inhale 2 puffs into the lungs every 6 (six) hours as needed for wheezing or shortness of breath. 04/30/14  Yes Thurnell Lose, MD  amLODipine (NORVASC) 10 MG tablet Take 10 mg by mouth daily.   Yes Historical Provider, MD  aspirin EC 81 MG tablet Take 81 mg by mouth daily.   Yes Historical Provider, MD  carvedilol (COREG) 12.5 MG tablet Take 12.5 mg by mouth 2 (two) times daily. 06/02/14  Yes Historical Provider, MD  citalopram (CELEXA) 20 MG tablet Take 20 mg by mouth daily.   Yes Historical Provider, MD  cloNIDine (CATAPRES) 0.2 MG tablet Take 0.5 tablets (0.1 mg total) by mouth 2 (two) times daily. 02/09/14  Yes Modena Jansky, MD  divalproex (DEPAKOTE ER) 500 MG 24 hr tablet Take 500 mg by mouth 2 (two) times daily. 05/29/14  Yes Historical Provider, MD  ferrous sulfate 325 (65 FE) MG tablet Take 325 mg by mouth 2 (two) times daily with a meal.    Yes Historical Provider, MD  furosemide (LASIX) 40 MG tablet Take 40 mg by mouth daily. 05/29/14  Yes Historical Provider, MD  gabapentin (NEURONTIN) 100 MG capsule Take 100 mg by mouth 2 (two) times daily.   Yes Historical Provider, MD  hydrALAZINE (APRESOLINE) 100 MG tablet Take 100 mg by mouth 3 (three) times daily. 05/29/14  Yes Historical Provider, MD  isosorbide mononitrate (IMDUR) 30 MG 24 hr tablet Take 30 mg by mouth daily.   Yes Historical Provider, MD  lisinopril  (PRINIVIL,ZESTRIL) 20 MG tablet Take 20 mg by mouth 2 (two) times daily. 06/02/14  Yes Historical Provider, MD  pantoprazole (PROTONIX) 40 MG tablet Take 40 mg by mouth daily.    Yes Historical Provider, MD  phenytoin (DILANTIN) 100 MG ER capsule Take 200-300 mg by mouth 2 (two) times daily. 200mg  in the morning, 300mg  in the evening   Yes Historical Provider, MD  pravastatin (PRAVACHOL) 40 MG tablet Take 1 tablet (40 mg total) by mouth daily at 6 PM. 02/09/14  Yes Modena Jansky, MD  nitroGLYCERIN (NITROSTAT) 0.4 MG SL tablet Place 1 tablet (0.4 mg total) under the tongue every 5 (five) minutes as needed for chest pain (upto max 3 doses at one time.). 02/09/14  Modena Jansky, MD    Current Facility-Administered Medications  Medication Dose Route Frequency Provider Last Rate Last Dose  . 0.9 %  sodium chloride infusion   Intravenous Once Samella Parr, NP      . acetaminophen (TYLENOL) tablet 650 mg  650 mg Oral Q6H PRN Samella Parr, NP       Or  . acetaminophen (TYLENOL) suppository 650 mg  650 mg Rectal Q6H PRN Samella Parr, NP      . albuterol (PROVENTIL) (2.5 MG/3ML) 0.083% nebulizer solution 3 mL  3 mL Inhalation Q6H PRN Samella Parr, NP      . alum & mag hydroxide-simeth (MAALOX/MYLANTA) 200-200-20 MG/5ML suspension 30 mL  30 mL Oral Q6H PRN Samella Parr, NP      . amLODipine (NORVASC) tablet 10 mg  10 mg Oral Daily Samella Parr, NP   10 mg at 06/06/14 1647  . Chlorhexidine Gluconate Cloth 2 % PADS 6 each  6 each Topical Q0600 Jonetta Osgood, MD   6 each at 06/07/14 0404  . citalopram (CELEXA) tablet 20 mg  20 mg Oral Daily Samella Parr, NP   20 mg at 06/06/14 1647  . cloNIDine (CATAPRES) tablet 0.1 mg  0.1 mg Oral BID Samella Parr, NP   0.1 mg at 06/07/14 0003  . docusate sodium (COLACE) capsule 100 mg  100 mg Oral BID Samella Parr, NP   100 mg at 06/06/14 2200  . ferrous sulfate tablet 325 mg  325 mg Oral BID WC Samella Parr, NP   325 mg at 06/06/14  1646  . furosemide (LASIX) tablet 40 mg  40 mg Oral BID Samella Parr, NP   40 mg at 06/06/14 1732  . gabapentin (NEURONTIN) capsule 100 mg  100 mg Oral BID Samella Parr, NP   100 mg at 06/06/14 2302  . hydrALAZINE (APRESOLINE) tablet 50 mg  50 mg Oral TID Samella Parr, NP   50 mg at 06/07/14 0003  . HYDROcodone-acetaminophen (NORCO/VICODIN) 5-325 MG per tablet 1 tablet  1 tablet Oral Q4H PRN Samella Parr, NP      . isosorbide mononitrate (IMDUR) 24 hr tablet 30 mg  30 mg Oral Daily Samella Parr, NP   30 mg at 06/06/14 1646  . lisinopril (PRINIVIL,ZESTRIL) tablet 2.5 mg  2.5 mg Oral Daily Samella Parr, NP   2.5 mg at 06/06/14 1647  . morphine 2 MG/ML injection 1 mg  1 mg Intravenous Q4H PRN Samella Parr, NP   1 mg at 06/07/14 0308  . mupirocin ointment (BACTROBAN) 2 % 1 application  1 application Nasal BID Jonetta Osgood, MD   1 application at 57/32/20 0003  . nitroGLYCERIN (NITROSTAT) SL tablet 0.4 mg  0.4 mg Sublingual Q5 min PRN Samella Parr, NP      . ondansetron [facility]) tablet 4 mg  4 mg Oral Q6H PRN Samella Parr, NP       Or  . ondansetron [facility]) injection 4 mg  4 mg Intravenous Q6H PRN Samella Parr, NP      . oxyCODONE (Oxy IR/ROXICODONE) immediate release tablet 5 mg  5 mg Oral Q4H PRN Samella Parr, NP      . pantoprazole (PROTONIX) EC tablet 40 mg  40 mg Oral Daily Samella Parr, NP   40 mg at 06/06/14 1646  . phenytoin (DILANTIN) ER capsule 200 mg  200 mg Oral  Daily Shanker Kristeen Mans, MD      . phenytoin (DILANTIN) ER capsule 300 mg  300 mg Oral QHS Jonetta Osgood, MD   300 mg at 06/06/14 2302  . polyethylene glycol (MIRALAX / GLYCOLAX) packet 17 g  17 g Oral Daily PRN Samella Parr, NP      . pravastatin (PRAVACHOL) tablet 40 mg  40 mg Oral q1800 Samella Parr, NP   40 mg at 06/06/14 1732  . sodium chloride 0.9 % injection 3 mL  3 mL Intravenous Q12H Samella Parr, NP   3 mL at 06/06/14 1546    Allergies as of 06/06/2014 - Review  Complete 06/06/2014  Allergen Reaction Noted  . Motrin [ibuprofen] Other (See Comments) 05/16/2010  . Tylenol [acetaminophen] Other (See Comments) 05/16/2010    Family History  Problem Relation Age of Onset  . Heart disease Mother     unknown type  . Heart disease Father 10    died of MI at 24yo  . Heart disease Paternal Grandfather 69    died of MI  . Heart disease    . Heart disease Brother 30    History   Social History  . Marital Status: Widowed    Spouse Name: N/A  . Number of Children: 2  . Years of Education: N/A   Occupational History  . Unemployed     previously worked in [employer]  .     Social History Main Topics  . Smoking status: Current Some Day Smoker -- 0.25 packs/day for 56 years    Types: Cigarettes  . Smokeless tobacco: Never Used  . Alcohol Use: Yes     Comment: occasionally drinks, a few times a month  . Drug Use: 1.00 per week    Special: Cocaine, Marijuana     Comment: uses cocaine once/wk at least.  **last used on Friday  . Sexual Activity: No     Comment: pt states "I smoked a little small piece on 02/25/14   Other Topics Concern  . Not on file   Social History Narrative   Lives in [place]. Originally from [place], has been in the [place] since 1980s      **11/2013 - says that he lives with dtr in [facility].**       Review of Systems: Ten point ROS is O/W negative except as mentioned in HPI.  Physical Exam: Vital signs in last 24 hours: Temp:  [97.4 F (36.3 C)-98.1 F (36.7 C)] 97.5 F (36.4 C) (03/20 8502) Pulse Rate:  [55-88] 55 (03/20 0632) Resp:  [17-22] 22 (03/20 0632) BP: (126-153)/(60-81) 147/77 mmHg (03/20 0632) SpO2:  [90 %-100 %] 100 % (03/20 7741) Weight:  [170 lb 3.1 oz (77.2 kg)-170 lb 13.7 oz (77.5 kg)] 170 lb 13.7 oz (77.5 kg) (03/20 0500) Last BM Date: 06/06/14 General:  Alert, cooperative in NAD Head:  Normocephalic and atraumatic. Eyes:  Sclera clear, no icterus.  Conjunctiva pink. Ears:   Normal auditory acuity. Mouth:  No deformity or lesions.   Lungs:  Clear throughout to auscultation.  No wheezes, crackles, or rhonchi.  Heart:  Slightly bradycardic.  No murmurs noted. Abdomen:  Soft, non-distended, BS present.  Non-tender. Rectal:  Deferred.  Heme positive in the ED.  Msk:  Symmetrical without gross deformities. Pulses:  Normal pulses noted. Extremities:  Without clubbing or edema. Neurologic:  Alert and  oriented x 4;  grossly normal neurologically. Skin:  Intact without significant lesions or rashes. Psych:  Alert and cooperative. Normal mood and affect.  Intake/Output from previous day: 03/19 0701 - 03/20 0700 In: 1150 [P.O.:480; Blood:670] Out: 450 [Urine:450]  Lab Results:  Recent Labs  06/06/14 0835 06/06/14 0950  WBC 5.3  --   HGB 6.6* 6.5*  HCT 22.2* 22.1*  PLT 407*  --    BMET  Recent Labs  06/06/14 0835  NA 133*  K 4.5  CL 102  CO2 21  GLUCOSE 90  BUN 23  CREATININE 1.32  CALCIUM 8.6   Studies/Results: Dg Chest 2 View  06/06/2014   CLINICAL DATA:  Patient fell at home twice this morning, history of hypertension coronary artery disease diabetes  EXAM: CHEST  2 VIEW  COMPARISON:  05/20/2014  FINDINGS: Severe cardiac enlargement stable. Status post prior CABG. Moderate vascular congestion. Mild interstitial prominence. No consolidation or effusion. Osseous thorax intact.  IMPRESSION: Similar to prior study there is evidence of congestive heart failure.   Electronically Signed   By: Skipper Cliche M.D.   On: 06/06/2014 08:44   Dg Lumbar Spine Complete  06/06/2014   CLINICAL DATA:  Recent falls with low back pain and radiculopathy  EXAM: LUMBAR SPINE - COMPLETE 4+ VIEW  COMPARISON:  None.  FINDINGS: Five lumbar type vertebral bodies are well visualized. Vertebral body height is well maintained. No spondylolysis or spondylolisthesis is seen. Osteophytic changes are noted with disc space narrowing at L4-5 and to a lesser degree at L3-4. Diffuse  aortic calcifications are noted without aneurysmal dilatation.  IMPRESSION: Degenerative change without acute abnormality.   Electronically Signed   By: Inez Catalina M.D.   On: 06/06/2014 08:45   Ct Cervical Spine Wo Contrast  06/06/2014   CLINICAL DATA:  Recent fall with neck pain, initial encounter  EXAM: CT CERVICAL SPINE WITHOUT CONTRAST  TECHNIQUE: Multidetector CT imaging of the cervical spine was performed without intravenous contrast. Multiplanar CT image reconstructions were also generated.  COMPARISON:  None.  FINDINGS: Seven cervical segments are well visualized. Mild osteophytic changes are noted particularly at C5-6 and C6-7. Facet hypertrophic changes are noted. No findings to suggest acute fracture or acute facet abnormality are noted. Heavy carotid calcifications are noted bilaterally.  IMPRESSION: Multilevel degenerative change without acute abnormality.   Electronically Signed   By: Inez Catalina M.D.   On: 06/06/2014 09:00   IMPRESSION:  -69 year old male with history of gastric AVM's requiring APC who presented to [facility] hospital after a fall at home and was found to have acute decrease in baseline Hgb with brown but heme positive stools.  Transfused with 2 units PRBC's, but repeat Hgb pending.  PLAN: -Follow up repeat Hgb and monitor. -? EGD +/- enteroscopy tomorrow, 3/21 with Dr. Ardis Hughs.  Will need MAC. -Agree with pantoprazole 40 mg daily. -Follow-up iron studies.  ZEHR, JESSICA D.  06/07/2014, 9:14 AM  Pager number [phone]  GI ATTENDING  History, laboratories, x-rays, multiple prior endoscopy reports reviewed. Patient personally seen and examined. The patient moved to the hospital after falling at home. Noted to be anemic with brown Hemoccult-positive stool. No clear evidence of GI bleeding though he reports "dark stools". He reports being on iron. Patient has been chronically anemic. I do not see a hemoglobin greater than 10 over the past multiple years. He has multiple  medical problems and I suspect that his anemia is multifactorial. He does have a history of AVMs. Prior colonoscopy negative. Also chronic renal insufficiency. Upper endoscopy/enteroscopy for possible AVM ablation is reasonable. However,  he may need hematology evaluation. Does this patient need Epogen? Any event, upper endoscopy/enteroscopy with possible therapy planned for tomorrow.The nature of the procedure, as well as the risks, benefits, and alternatives were carefully and thoroughly reviewed with the patient. Ample time for discussion and questions allowed. The patient understood, was satisfied, and agreed to proceed.  Docia Chuck. Geri Seminole., M.D. [facility] Division of Gastroenterology

## 2014-06-07 NOTE — Progress Notes (Addendum)
Patient Demographics  Charles [name], is a 69 y.o. male, DOB - 12/16/1945, LPF:[id number]  Admit date - 06/06/2014   Admitting Physician Hosie Poisson, MD  Outpatient Primary MD for the patient is [name]  LOS - 1   Chief Complaint  Patient presents with  . Fall   Admission history of present illness/brief narrative: 69 year old male patient with  history combined systolic and diastolic heart failure,iron deficiency anemia,  prior GI bleed in setting of gastric AVMs, CAD status post CABG, chronic kidney disease stage III, pulmonary hypertension, polysubstance abuse and history of homelessness. recently discharged  on 3/7 after being treated for acute on chronic combined systolic and diastolic heart failure. During that admission his hemoglobin had decreased to 7.4 and he was given 1 unit of packed red blood cells. At time of discharge hemoglobin was 8.3. Returns to the ER with complaints of fall. This patient also has chronic back pain and presented with complaints of back pain and  bilateral leg pain that pain and cough. He reports that he fell because his legs were hurting. Patient was seen after arrival to this facility as a direct admission. He was in no acute distress. He was reporting the above symptoms . He also is reporting low back pain and is requesting medication for pain. He reported dark stools. He denies orthopnea, he reports shortness of breath occurring in "spells". On exam it was noted that he had house arrest bracelet on his right ankle; states he does need to notify the police that he has been admitted to the hospital. Laboratory data from the ER reveals sodium 133, normal renal function for this patient, BNP 1732, normal troponin, hemoglobin of 6.6 with hematocrit of 22.2 and a platelet count of 407,000. Stool was Hemoccult positive. Chest x-ray with  moderate vascular congestion. CT cervical spine as well as plain x-ray lumbar spine without evidence of acute traumatic injury post fall  Subjective:   Charles [name] today has, No headache, No chest pain, No abdominal pain - No Nausea, No new weakness tingling or numbness, No Cough - SOB.   Assessment & Plan    Principal Problem:   Symptomatic anemia Active Problems:   Iron deficiency anemia   Hypertension   Homelessness   Moderate to severe pulmonary hypertension   Polysubstance abuse   DDD (degenerative disc disease), lumbar   Seizure disorder   Leg pain, bilateral   Acute on chronic combined systolic and diastolic heart failure   Gastric AVM   ICM- new drop in EF-40-45%- 2D 11/27/13  Symptomatic anemia / history of gastric AVMs -Transfused 2 units packed red blood cells 3/19 - GI consult appreciated - Continue with Protonix 40 mg oral daily - Workup significant for iron deficiency anemia, as low iron and ferritin level, iron supplements on discharge.   Acute on chronic combined systolic and diastolic heart failure/ICM EF 40% -Mildly decompensated as evidenced by abnormal chest x-ray and mild increase in dyspnea on exertion -Continue with Lasix. -Continue with lisinopril, no beta blockers giving his cocaine use.    Hypertension -Continue home medications: Norvasc, Catapres, Lasix, Apresoline, Imdur, and Zestril   Moderate to severe pulmonary hypertension -Continue nitrates and hydralazine   Seizure disorder -Continue Dilantin -Level is less  than 2.5, most likely due to noncompliance.   Homelessness/ Polysubstance abuse - Urine drug screen positive for cocaine, opiates, benzodiazepines - Education officer, museum consult  Hyperlipidemia - Continue with statin Code Status: Full  Family Communication: None at bedside  Disposition Plan: Pending social work consult   Procedures  None   Consults   Gastroenterology   Medications  Scheduled Meds: . sodium  chloride   Intravenous Once  . amLODipine  10 mg Oral Daily  . Chlorhexidine Gluconate Cloth  6 each Topical Q0600  . citalopram  20 mg Oral Daily  . cloNIDine  0.1 mg Oral BID  . docusate sodium  100 mg Oral BID  . feeding supplement (ENSURE COMPLETE)  237 mL Oral TID WC  . ferrous sulfate  325 mg Oral BID WC  . furosemide  40 mg Oral BID  . gabapentin  100 mg Oral BID  . hydrALAZINE  50 mg Oral TID  . isosorbide mononitrate  30 mg Oral Daily  . lisinopril  2.5 mg Oral Daily  . mupirocin ointment  1 application Nasal BID  . pantoprazole  40 mg Oral Daily  . phenytoin  200 mg Oral Daily  . phenytoin  300 mg Oral QHS  . pravastatin  40 mg Oral q1800  . sodium chloride  3 mL Intravenous Q12H   Continuous Infusions:  PRN Meds:.acetaminophen **OR** acetaminophen, albuterol, alum & mag hydroxide-simeth, HYDROcodone-acetaminophen, morphine injection, nitroGLYCERIN, ondansetron **OR** ondansetron (ZOFRAN) IV, oxyCODONE, polyethylene glycol  DVT Prophylaxis   SCDs   Lab Results  Component Value Date   PLT 330 06/07/2014    Antibiotics    Anti-infectives    None          Objective:   Filed Vitals:   06/07/14 0500 06/07/14 0632 06/07/14 0921 06/07/14 1359  BP:  147/77 153/82 111/60  Pulse:  55  71  Temp:  97.5 F (36.4 C)  98.1 F (36.7 C)  TempSrc:  Oral  Oral  Resp:  22  18  Height:      Weight: 77.5 kg (170 lb 13.7 oz)     SpO2:  100%  93%    Wt Readings from Last 3 Encounters:  06/07/14 77.5 kg (170 lb 13.7 oz)  05/25/14 76.658 kg (169 lb)  04/30/14 75.751 kg (167 lb)     Intake/Output Summary (Last 24 hours) at 06/07/14 1455 Last data filed at 06/07/14 1416  Gross per 24 hour  Intake   1390 ml  Output    800 ml  Net    590 ml     Physical Exam  Awake Alert, Oriented X 3, No new F.N deficits, Normal affect [place].AT,PERRAL Supple Neck,No JVD, No cervical lymphadenopathy appriciated.  Symmetrical Chest wall movement, Good air movement bilaterally,  CTAB RRR,No Gallops,Rubs or new Murmurs, No Parasternal Heave +ve B.Sounds, Abd Soft, No tenderness, No organomegaly appriciated, No rebound - guarding or rigidity. No Cyanosis, Clubbing or edema, No new Rash or bruise     Data Review   Micro Results Recent Results (from the past 240 hour(s))  MRSA PCR Screening     Status: Abnormal   Collection Time: 06/06/14  6:24 PM  Result Value Ref Range Status   MRSA by PCR POSITIVE (A) NEGATIVE Final    Comment:        The GeneXpert MRSA Assay (FDA approved for NASAL specimens only), is one component of a comprehensive MRSA colonization surveillance program. It is not intended to diagnose MRSA infection nor  to guide or monitor treatment for MRSA infections. RESULT CALLED TO, READ BACK BY AND VERIFIED WITH: Loney Hering RN AY [id number] 06/06/14 BY [facility]     Radiology Reports Dg Chest 2 View  06/06/2014   CLINICAL DATA:  Patient fell at home twice this morning, history of hypertension coronary artery disease diabetes  EXAM: CHEST  2 VIEW  COMPARISON:  05/20/2014  FINDINGS: Severe cardiac enlargement stable. Status post prior CABG. Moderate vascular congestion. Mild interstitial prominence. No consolidation or effusion. Osseous thorax intact.  IMPRESSION: Similar to prior study there is evidence of congestive heart failure.   Electronically Signed   By: Skipper Cliche M.D.   On: 06/06/2014 08:44   Dg Lumbar Spine Complete  06/06/2014   CLINICAL DATA:  Recent falls with low back pain and radiculopathy  EXAM: LUMBAR SPINE - COMPLETE 4+ VIEW  COMPARISON:  None.  FINDINGS: Five lumbar type vertebral bodies are well visualized. Vertebral body height is well maintained. No spondylolysis or spondylolisthesis is seen. Osteophytic changes are noted with disc space narrowing at L4-5 and to a lesser degree at L3-4. Diffuse aortic calcifications are noted without aneurysmal dilatation.  IMPRESSION: Degenerative change without acute abnormality.    Electronically Signed   By: Inez Catalina M.D.   On: 06/06/2014 08:45   Ct Cervical Spine Wo Contrast  06/06/2014   CLINICAL DATA:  Recent fall with neck pain, initial encounter  EXAM: CT CERVICAL SPINE WITHOUT CONTRAST  TECHNIQUE: Multidetector CT imaging of the cervical spine was performed without intravenous contrast. Multiplanar CT image reconstructions were also generated.  COMPARISON:  None.  FINDINGS: Seven cervical segments are well visualized. Mild osteophytic changes are noted particularly at C5-6 and C6-7. Facet hypertrophic changes are noted. No findings to suggest acute fracture or acute facet abnormality are noted. Heavy carotid calcifications are noted bilaterally.  IMPRESSION: Multilevel degenerative change without acute abnormality.   Electronically Signed   By: Inez Catalina M.D.   On: 06/06/2014 09:00    CBC  Recent Labs Lab 06/06/14 0835 06/06/14 0950 06/07/14 1029  WBC 5.3  --  6.5  HGB 6.6* 6.5* 8.0*  HCT 22.2* 22.1* 26.2*  PLT 407*  --  330  MCV 83.5  --  84.2  MCH 24.8*  --  25.7*  MCHC 29.7*  --  30.5  RDW 17.4*  --  16.2*  LYMPHSABS 0.6*  --   --   MONOABS 0.6  --   --   EOSABS 0.1  --   --   BASOSABS 0.0  --   --     Chemistries   Recent Labs Lab 06/06/14 0835  NA 133*  K 4.5  CL 102  CO2 21  GLUCOSE 90  BUN 23  CREATININE 1.32  CALCIUM 8.6   ------------------------------------------------------------------------------------------------------------------ estimated creatinine clearance is 50 mL/min (by C-G formula based on Cr of 1.32). ------------------------------------------------------------------------------------------------------------------ No results for input(s): HGBA1C in the last 72 hours. ------------------------------------------------------------------------------------------------------------------ No results for input(s): CHOL, HDL, LDLCALC, TRIG, CHOLHDL, LDLDIRECT in the last 72  hours. ------------------------------------------------------------------------------------------------------------------ No results for input(s): TSH, T4TOTAL, T3FREE, THYROIDAB in the last 72 hours.  Invalid input(s): FREET3 ------------------------------------------------------------------------------------------------------------------  Recent Labs  06/06/14 1721  VITAMINB12 736  FOLATE >20.0  FERRITIN 46  TIBC 397  IRON 10*  RETICCTPCT 2.7    Coagulation profile  Recent Labs Lab 06/07/14 1400  INR 1.37    No results for input(s): DDIMER in the last 72 hours.  Cardiac Enzymes  Recent Labs Lab 06/06/14  [place] <0.03   ------------------------------------------------------------------------------------------------------------------ Invalid input(s): POCBNP     Time Spent in minutes   35 minutes   ELGERGAWY, DAWOOD M.D on 06/07/2014 at 2:55 PM  Between 7am to 7pm - Pager - [phone]  After 7pm go to [email or web address] - password [email or web address]  And look for the night coverage person covering for me after hours  [facility] Office  [phone]   **Disclaimer: This note may have been dictated with voice recognition software. Similar sounding words can inadvertently be transcribed and this note may contain transcription errors which may not have been corrected upon publication of note.**

## 2014-06-07 NOTE — Progress Notes (Signed)
Patient requesting stronger IV pain medication several times this shift. On-call provider made aware at 2200 06/06/17. Patient asking for medication but falling asleep frequently while RN still in room. Patient stated he feels he is being denied pain medication. RN unable to give IV pain medication due to blood transfusions in process. Will continue to monitor.

## 2014-06-07 NOTE — Progress Notes (Signed)
First unit of PRBC ended at 0300. Transfused approximately 320 mL. Patient tolerated well. IV Lasix given per order. Will continue to monitor.

## 2014-06-08 ENCOUNTER — Encounter (HOSPITAL_COMMUNITY)
Admission: EM | Disposition: A | Discharge status: Home / Self Care | Payer: Self-pay | Source: Home / Self Care | Attending: Internal Medicine

## 2014-06-08 ENCOUNTER — Inpatient Hospital Stay (HOSPITAL_COMMUNITY): Payer: Medicare Other | Admitting: Anesthesiology

## 2014-06-08 ENCOUNTER — Encounter (HOSPITAL_COMMUNITY): Payer: Self-pay | Admitting: *Deleted

## 2014-06-08 DIAGNOSIS — Q2733 Arteriovenous malformation of digestive system vessel: Principal | ICD-10-CM

## 2014-06-08 HISTORY — PX: ESOPHAGOGASTRODUODENOSCOPY (EGD) WITH PROPOFOL: SHX5813

## 2014-06-08 LAB — TYPE AND SCREEN
ABO/RH(D): O POS
Antibody Screen: NEGATIVE
Unit division: 0
Unit division: 0

## 2014-06-08 LAB — CBC
HCT: 27 % — ABNORMAL LOW (ref 39.0–52.0)
Hemoglobin: 8.2 g/dL — ABNORMAL LOW (ref 13.0–17.0)
MCH: 25.9 pg — ABNORMAL LOW (ref 26.0–34.0)
MCHC: 30.4 g/dL (ref 30.0–36.0)
MCV: 85.2 fL (ref 78.0–100.0)
Platelets: 333 10*3/uL (ref 150–400)
RBC: 3.17 MIL/uL — AB (ref 4.22–5.81)
RDW: 16.8 % — ABNORMAL HIGH (ref 11.5–15.5)
WBC: 7.1 10*3/uL (ref 4.0–10.5)

## 2014-06-08 LAB — BASIC METABOLIC PANEL
ANION GAP: 7 (ref 5–15)
BUN: 25 mg/dL — ABNORMAL HIGH (ref 6–23)
CALCIUM: 8.4 mg/dL (ref 8.4–10.5)
CO2: 25 mmol/L (ref 19–32)
CREATININE: 1.3 mg/dL (ref 0.50–1.35)
Chloride: 104 mmol/L (ref 96–112)
GFR calc Af Amer: 63 mL/min — ABNORMAL LOW (ref >90)
GFR calc non Af Amer: 54 mL/min — ABNORMAL LOW (ref >90)
GLUCOSE: 87 mg/dL (ref 70–99)
Potassium: 4.4 mmol/L (ref 3.5–5.1)
Sodium: 136 mmol/L (ref 135–145)

## 2014-06-08 SURGERY — ESOPHAGOGASTRODUODENOSCOPY (EGD) WITH PROPOFOL
Anesthesia: Monitor Anesthesia Care

## 2014-06-08 MED ORDER — FUROSEMIDE 40 MG PO TABS
40.0000 mg | ORAL_TABLET | Freq: Two times a day (BID) | ORAL | Status: DC
  Administered 2014-06-08 – 2014-06-09 (×2): 40 mg via ORAL
  Filled 2014-06-08 (×4): qty 1

## 2014-06-08 MED ORDER — BUTAMBEN-TETRACAINE-BENZOCAINE 2-2-14 % EX AERO
INHALATION_SPRAY | CUTANEOUS | Status: DC | PRN
  Administered 2014-06-08: 2 via TOPICAL

## 2014-06-08 MED ORDER — LACTATED RINGERS IV SOLN
INTRAVENOUS | Status: DC | PRN
  Administered 2014-06-08: 10:00:00 via INTRAVENOUS

## 2014-06-08 MED ORDER — PROPOFOL INFUSION 10 MG/ML OPTIME
INTRAVENOUS | Status: DC | PRN
  Administered 2014-06-08: 100 ug/kg/min via INTRAVENOUS

## 2014-06-08 MED ORDER — MIDAZOLAM HCL 5 MG/5ML IJ SOLN
INTRAMUSCULAR | Status: DC | PRN
  Administered 2014-06-08: 2 mg via INTRAVENOUS

## 2014-06-08 MED ORDER — LACTATED RINGERS IV SOLN
INTRAVENOUS | Status: DC
Start: 2014-06-08 — End: 2014-06-08
  Administered 2014-06-08: 1000 mL via INTRAVENOUS

## 2014-06-08 MED ORDER — OXYCODONE HCL 5 MG PO TABS
5.0000 mg | ORAL_TABLET | ORAL | Status: DC | PRN
  Administered 2014-06-08 (×2): 5 mg via ORAL
  Filled 2014-06-08 (×2): qty 1

## 2014-06-08 MED ORDER — LIDOCAINE HCL (CARDIAC) 20 MG/ML IV SOLN
INTRAVENOUS | Status: DC | PRN
  Administered 2014-06-08: 100 mg via INTRAVENOUS

## 2014-06-08 MED ORDER — FENTANYL CITRATE 0.05 MG/ML IJ SOLN
INTRAMUSCULAR | Status: DC | PRN
  Administered 2014-06-08: 50 ug via INTRAVENOUS

## 2014-06-08 MED ORDER — GABAPENTIN 100 MG PO CAPS
100.0000 mg | ORAL_CAPSULE | Freq: Three times a day (TID) | ORAL | Status: DC
  Administered 2014-06-08 – 2014-06-09 (×2): 100 mg via ORAL
  Filled 2014-06-08 (×4): qty 1

## 2014-06-08 MED ORDER — SODIUM CHLORIDE 0.9 % IV SOLN: INTRAVENOUS | Status: DC

## 2014-06-08 NOTE — H&P (View-Only) (Signed)
Referring Provider: No ref. provider found Primary Care Physician:  [name] Primary Gastroenterologist:  ?? Unassigned   Reason for Consultation:  Anemia and heme positive stools with history of AVM's  HPI: Charles [name] is a 69 y.o. male with multiple medical problems including combined systolic and diastolic heart failure, known iron deficiency anemia, history of prior GI bleed in setting of gastric AVMs requiring APC treatment, CAD status post CABG, chronic kidney disease stage III, pulmonary hypertension, polysubstance abuse, chronic back pain, and history of homelessness. He was recently discharged from this facility on 3/7 after being treated for acute on chronic combined systolic and diastolic heart failure. During that admission his hemoglobin had decreased to 7.4 and he was given 1 unit of packed red blood cells. At time of discharge hemoglobin was 8.3.  He returned to the ED at [facility] with complaints of fall at home because his legs gave out; he reports that his legs have been hurting a lot . He states he has developed a cough which he typically equates with evolution of heart failure exacerbation.  He also noticed some increased dyspnea on exertion, but no chest pain. Patient had similar symptoms when he presented for the previous admission.  He denied red or black stools initially but then admitted that they are usually a darker color; he is on iron supplements at home.  Then he says that on Monday they were very black like tar.  Was heme positive in the ED, but stool was reported brown at that time.  Hgb was 6.6 grams compared to 8.3 grams just two weeks ago.  Was transferred to [facility] for admission and transfusion of 2 units PRBC's, which he had received, but repeat Hgb is pending.  Denies any other GI complaints.  07/2013 small bowel enteroscopy by Dr. Fuller Plan showed one AVM in the antrum to which APC was applied; remainder of study was normal.  07/2012 EGD by Dr.  Collene Mares showed small patch of mucosa in the mid-body with spotty bleeding to which APC was applied.  03/2010 EGD by Dr. Benson Norway showed hiatal hernia  03/2010 colonoscopy by Dr. Benson Norway was normal except for internal hemorrhoids  Iron studies, folate, and Vitamin B 12 levels are pending.  Is on iron supplements 325 mg BID at home as well as pantoprazole 40 mg daily.   Past Medical History  Diagnosis Date  . Iron deficiency anemia   . Chronic combined systolic and diastolic CHF, NYHA class 2     a. 11/2013 Echo: EF 40-45%, basl-mid inflat AK, Gr 2 DD.  [name] Hyperlipidemia LDL goal <70   . Obstructive sleep apnea     a. not on home CPAP  . Ischemic cardiomyopathy     a. 11/2013 Echo: EF 40-45%, basl-mid inflat AK, Gr 2 DD, mild AI, mod dil LA/RA, mod reduced RV fxn, PASP 24mmHg.  [name] Homelessness   . Moderate to severe pulmonary hypertension 03/2010    a. PA peak pressure of 76 mmHg (per 2D Echo 03/2010)  . Polysubstance abuse     a. cocaine, THC  . AV malformation of gastrointestinal tract     a. w/ h/o GIB.  [name] Family history of early CAD   . DDD (degenerative disc disease), lumbar   . Peripheral vascular disease   . Seizure disorder   . Hypertension   . CAD (coronary artery disease)     a. s/p 3-vessel CABG (12/2007) // 100% RCA stenosis with collaterals from  left system. Severe bifurcation lesions of proxima CXA and OM. Moderate LAD disease - followed by Dr. Wyline Copas in [facility];  b. 11/2013 Myoview: Large fixed inf defect w/o ischemia, EF 35%.  . Diabetes   . Chronic kidney disease (CKD), stage III (moderate)     a. BL SCr 1.5-1.6  . Renal artery stenosis   . Seizures     Past Surgical History  Procedure Laterality Date  . Apc  03/2010    To treat small bowel AVMs  . Esophagogastroduodenoscopy N/A 08/14/2012    Procedure: ESOPHAGOGASTRODUODENOSCOPY (EGD);  Surgeon: [name], MD;  Location: [facility] ENDOSCOPY;  Service: Endoscopy;  Laterality: N/A;  . Hot hemostasis N/A 08/14/2012    Procedure:  HOT HEMOSTASIS (ARGON PLASMA COAGULATION/BICAP);  Surgeon: [name], MD;  Location: [facility] ENDOSCOPY;  Service: Endoscopy;  Laterality: N/A;  . Esophagogastroduodenoscopy (egd) with propofol N/A 08/04/2013    Procedure: ESOPHAGOGASTRODUODENOSCOPY (EGD) WITH PROPOFOL;  Surgeon: [name], MD;  Location: [facility] ENDOSCOPY;  Service: Endoscopy;  Laterality: N/A;  . Coronary artery bypass graft  12/2007    Prior to Admission medications   Medication Sig Start Date End Date Taking? Authorizing Provider  albuterol (PROVENTIL HFA;VENTOLIN HFA) 108 (90 BASE) MCG/ACT inhaler Inhale 2 puffs into the lungs every 6 (six) hours as needed for wheezing or shortness of breath. 04/30/14  Yes Thurnell Lose, MD  amLODipine (NORVASC) 10 MG tablet Take 10 mg by mouth daily.   Yes Historical Provider, MD  aspirin EC 81 MG tablet Take 81 mg by mouth daily.   Yes Historical Provider, MD  carvedilol (COREG) 12.5 MG tablet Take 12.5 mg by mouth 2 (two) times daily. 06/02/14  Yes Historical Provider, MD  citalopram (CELEXA) 20 MG tablet Take 20 mg by mouth daily.   Yes Historical Provider, MD  cloNIDine (CATAPRES) 0.2 MG tablet Take 0.5 tablets (0.1 mg total) by mouth 2 (two) times daily. 02/09/14  Yes Modena Jansky, MD  divalproex (DEPAKOTE ER) 500 MG 24 hr tablet Take 500 mg by mouth 2 (two) times daily. 05/29/14  Yes Historical Provider, MD  ferrous sulfate 325 (65 FE) MG tablet Take 325 mg by mouth 2 (two) times daily with a meal.    Yes Historical Provider, MD  furosemide (LASIX) 40 MG tablet Take 40 mg by mouth daily. 05/29/14  Yes Historical Provider, MD  gabapentin (NEURONTIN) 100 MG capsule Take 100 mg by mouth 2 (two) times daily.   Yes Historical Provider, MD  hydrALAZINE (APRESOLINE) 100 MG tablet Take 100 mg by mouth 3 (three) times daily. 05/29/14  Yes Historical Provider, MD  isosorbide mononitrate (IMDUR) 30 MG 24 hr tablet Take 30 mg by mouth daily.   Yes Historical Provider, MD  lisinopril  (PRINIVIL,ZESTRIL) 20 MG tablet Take 20 mg by mouth 2 (two) times daily. 06/02/14  Yes Historical Provider, MD  pantoprazole (PROTONIX) 40 MG tablet Take 40 mg by mouth daily.    Yes Historical Provider, MD  phenytoin (DILANTIN) 100 MG ER capsule Take 200-300 mg by mouth 2 (two) times daily. 200mg  in the morning, 300mg  in the evening   Yes Historical Provider, MD  pravastatin (PRAVACHOL) 40 MG tablet Take 1 tablet (40 mg total) by mouth daily at 6 PM. 02/09/14  Yes Modena Jansky, MD  nitroGLYCERIN (NITROSTAT) 0.4 MG SL tablet Place 1 tablet (0.4 mg total) under the tongue every 5 (five) minutes as needed for chest pain (upto max 3 doses at one time.). 02/09/14  Modena Jansky, MD    Current Facility-Administered Medications  Medication Dose Route Frequency Provider Last Rate Last Dose  . 0.9 %  sodium chloride infusion   Intravenous Once Samella Parr, NP      . acetaminophen (TYLENOL) tablet 650 mg  650 mg Oral Q6H PRN Samella Parr, NP       Or  . acetaminophen (TYLENOL) suppository 650 mg  650 mg Rectal Q6H PRN Samella Parr, NP      . albuterol (PROVENTIL) (2.5 MG/3ML) 0.083% nebulizer solution 3 mL  3 mL Inhalation Q6H PRN Samella Parr, NP      . alum & mag hydroxide-simeth (MAALOX/MYLANTA) 200-200-20 MG/5ML suspension 30 mL  30 mL Oral Q6H PRN Samella Parr, NP      . amLODipine (NORVASC) tablet 10 mg  10 mg Oral Daily Samella Parr, NP   10 mg at 06/06/14 1647  . Chlorhexidine Gluconate Cloth 2 % PADS 6 each  6 each Topical Q0600 Jonetta Osgood, MD   6 each at 06/07/14 0404  . citalopram (CELEXA) tablet 20 mg  20 mg Oral Daily Samella Parr, NP   20 mg at 06/06/14 1647  . cloNIDine (CATAPRES) tablet 0.1 mg  0.1 mg Oral BID Samella Parr, NP   0.1 mg at 06/07/14 0003  . docusate sodium (COLACE) capsule 100 mg  100 mg Oral BID Samella Parr, NP   100 mg at 06/06/14 2200  . ferrous sulfate tablet 325 mg  325 mg Oral BID WC Samella Parr, NP   325 mg at 06/06/14  1646  . furosemide (LASIX) tablet 40 mg  40 mg Oral BID Samella Parr, NP   40 mg at 06/06/14 1732  . gabapentin (NEURONTIN) capsule 100 mg  100 mg Oral BID Samella Parr, NP   100 mg at 06/06/14 2302  . hydrALAZINE (APRESOLINE) tablet 50 mg  50 mg Oral TID Samella Parr, NP   50 mg at 06/07/14 0003  . HYDROcodone-acetaminophen (NORCO/VICODIN) 5-325 MG per tablet 1 tablet  1 tablet Oral Q4H PRN Samella Parr, NP      . isosorbide mononitrate (IMDUR) 24 hr tablet 30 mg  30 mg Oral Daily Samella Parr, NP   30 mg at 06/06/14 1646  . lisinopril (PRINIVIL,ZESTRIL) tablet 2.5 mg  2.5 mg Oral Daily Samella Parr, NP   2.5 mg at 06/06/14 1647  . morphine 2 MG/ML injection 1 mg  1 mg Intravenous Q4H PRN Samella Parr, NP   1 mg at 06/07/14 0308  . mupirocin ointment (BACTROBAN) 2 % 1 application  1 application Nasal BID Jonetta Osgood, MD   1 application at 67/34/19 0003  . nitroGLYCERIN (NITROSTAT) SL tablet 0.4 mg  0.4 mg Sublingual Q5 min PRN Samella Parr, NP      . ondansetron [facility]) tablet 4 mg  4 mg Oral Q6H PRN Samella Parr, NP       Or  . ondansetron [facility]) injection 4 mg  4 mg Intravenous Q6H PRN Samella Parr, NP      . oxyCODONE (Oxy IR/ROXICODONE) immediate release tablet 5 mg  5 mg Oral Q4H PRN Samella Parr, NP      . pantoprazole (PROTONIX) EC tablet 40 mg  40 mg Oral Daily Samella Parr, NP   40 mg at 06/06/14 1646  . phenytoin (DILANTIN) ER capsule 200 mg  200 mg Oral  Daily Shanker Kristeen Mans, MD      . phenytoin (DILANTIN) ER capsule 300 mg  300 mg Oral QHS Jonetta Osgood, MD   300 mg at 06/06/14 2302  . polyethylene glycol (MIRALAX / GLYCOLAX) packet 17 g  17 g Oral Daily PRN Samella Parr, NP      . pravastatin (PRAVACHOL) tablet 40 mg  40 mg Oral q1800 Samella Parr, NP   40 mg at 06/06/14 1732  . sodium chloride 0.9 % injection 3 mL  3 mL Intravenous Q12H Samella Parr, NP   3 mL at 06/06/14 1546    Allergies as of 06/06/2014 - Review  Complete 06/06/2014  Allergen Reaction Noted  . Motrin [ibuprofen] Other (See Comments) 05/16/2010  . Tylenol [acetaminophen] Other (See Comments) 05/16/2010    Family History  Problem Relation Age of Onset  . Heart disease Mother     unknown type  . Heart disease Father 3    died of MI at 61yo  . Heart disease Paternal Grandfather 67    died of MI  . Heart disease    . Heart disease Brother 30    History   Social History  . Marital Status: Widowed    Spouse Name: N/A  . Number of Children: 2  . Years of Education: N/A   Occupational History  . Unemployed     previously worked in [employer]  .     Social History Main Topics  . Smoking status: Current Some Day Smoker -- 0.25 packs/day for 56 years    Types: Cigarettes  . Smokeless tobacco: Never Used  . Alcohol Use: Yes     Comment: occasionally drinks, a few times a month  . Drug Use: 1.00 per week    Special: Cocaine, Marijuana     Comment: uses cocaine once/wk at least.  **last used on Friday  . Sexual Activity: No     Comment: pt states "I smoked a little small piece on 02/25/14   Other Topics Concern  . Not on file   Social History Narrative   Lives in [place]. Originally from [place], has been in the [place] since 1980s      **11/2013 - says that he lives with dtr in [facility].**       Review of Systems: Ten point ROS is O/W negative except as mentioned in HPI.  Physical Exam: Vital signs in last 24 hours: Temp:  [97.4 F (36.3 C)-98.1 F (36.7 C)] 97.5 F (36.4 C) (03/20 3810) Pulse Rate:  [55-88] 55 (03/20 0632) Resp:  [17-22] 22 (03/20 0632) BP: (126-153)/(60-81) 147/77 mmHg (03/20 0632) SpO2:  [90 %-100 %] 100 % (03/20 1751) Weight:  [170 lb 3.1 oz (77.2 kg)-170 lb 13.7 oz (77.5 kg)] 170 lb 13.7 oz (77.5 kg) (03/20 0500) Last BM Date: 06/06/14 General:  Alert, cooperative in NAD Head:  Normocephalic and atraumatic. Eyes:  Sclera clear, no icterus.  Conjunctiva pink. Ears:   Normal auditory acuity. Mouth:  No deformity or lesions.   Lungs:  Clear throughout to auscultation.  No wheezes, crackles, or rhonchi.  Heart:  Slightly bradycardic.  No murmurs noted. Abdomen:  Soft, non-distended, BS present.  Non-tender. Rectal:  Deferred.  Heme positive in the ED.  Msk:  Symmetrical without gross deformities. Pulses:  Normal pulses noted. Extremities:  Without clubbing or edema. Neurologic:  Alert and  oriented x 4;  grossly normal neurologically. Skin:  Intact without significant lesions or rashes. Psych:  Alert and cooperative. Normal mood and affect.  Intake/Output from previous day: 03/19 0701 - 03/20 0700 In: 1150 [P.O.:480; Blood:670] Out: 450 [Urine:450]  Lab Results:  Recent Labs  06/06/14 0835 06/06/14 0950  WBC 5.3  --   HGB 6.6* 6.5*  HCT 22.2* 22.1*  PLT 407*  --    BMET  Recent Labs  06/06/14 0835  NA 133*  K 4.5  CL 102  CO2 21  GLUCOSE 90  BUN 23  CREATININE 1.32  CALCIUM 8.6   Studies/Results: Dg Chest 2 View  06/06/2014   CLINICAL DATA:  Patient fell at home twice this morning, history of hypertension coronary artery disease diabetes  EXAM: CHEST  2 VIEW  COMPARISON:  05/20/2014  FINDINGS: Severe cardiac enlargement stable. Status post prior CABG. Moderate vascular congestion. Mild interstitial prominence. No consolidation or effusion. Osseous thorax intact.  IMPRESSION: Similar to prior study there is evidence of congestive heart failure.   Electronically Signed   By: Skipper Cliche M.D.   On: 06/06/2014 08:44   Dg Lumbar Spine Complete  06/06/2014   CLINICAL DATA:  Recent falls with low back pain and radiculopathy  EXAM: LUMBAR SPINE - COMPLETE 4+ VIEW  COMPARISON:  None.  FINDINGS: Five lumbar type vertebral bodies are well visualized. Vertebral body height is well maintained. No spondylolysis or spondylolisthesis is seen. Osteophytic changes are noted with disc space narrowing at L4-5 and to a lesser degree at L3-4. Diffuse  aortic calcifications are noted without aneurysmal dilatation.  IMPRESSION: Degenerative change without acute abnormality.   Electronically Signed   By: Inez Catalina M.D.   On: 06/06/2014 08:45   Ct Cervical Spine Wo Contrast  06/06/2014   CLINICAL DATA:  Recent fall with neck pain, initial encounter  EXAM: CT CERVICAL SPINE WITHOUT CONTRAST  TECHNIQUE: Multidetector CT imaging of the cervical spine was performed without intravenous contrast. Multiplanar CT image reconstructions were also generated.  COMPARISON:  None.  FINDINGS: Seven cervical segments are well visualized. Mild osteophytic changes are noted particularly at C5-6 and C6-7. Facet hypertrophic changes are noted. No findings to suggest acute fracture or acute facet abnormality are noted. Heavy carotid calcifications are noted bilaterally.  IMPRESSION: Multilevel degenerative change without acute abnormality.   Electronically Signed   By: Inez Catalina M.D.   On: 06/06/2014 09:00   IMPRESSION:  -69 year old male with history of gastric AVM's requiring APC who presented to [facility] hospital after a fall at home and was found to have acute decrease in baseline Hgb with brown but heme positive stools.  Transfused with 2 units PRBC's, but repeat Hgb pending.  PLAN: -Follow up repeat Hgb and monitor. -? EGD +/- enteroscopy tomorrow, 3/21 with Dr. Ardis Hughs.  Will need MAC. -Agree with pantoprazole 40 mg daily. -Follow-up iron studies.  ZEHR, JESSICA D.  06/07/2014, 9:14 AM  Pager number [phone]  GI ATTENDING  History, laboratories, x-rays, multiple prior endoscopy reports reviewed. Patient personally seen and examined. The patient moved to the hospital after falling at home. Noted to be anemic with brown Hemoccult-positive stool. No clear evidence of GI bleeding though he reports "dark stools". He reports being on iron. Patient has been chronically anemic. I do not see a hemoglobin greater than 10 over the past multiple years. He has multiple  medical problems and I suspect that his anemia is multifactorial. He does have a history of AVMs. Prior colonoscopy negative. Also chronic renal insufficiency. Upper endoscopy/enteroscopy for possible AVM ablation is reasonable. However,  he may need hematology evaluation. Does this patient need Epogen? Any event, upper endoscopy/enteroscopy with possible therapy planned for tomorrow.The nature of the procedure, as well as the risks, benefits, and alternatives were carefully and thoroughly reviewed with the patient. Ample time for discussion and questions allowed. The patient understood, was satisfied, and agreed to proceed.  Docia Chuck. Geri Seminole., M.D. [facility] Division of Gastroenterology

## 2014-06-08 NOTE — Progress Notes (Signed)
Patient Demographics  Charles [name], is a 69 y.o. male, DOB - 1945/06/22, KDX:[id number]  Admit date - 06/06/2014   Admitting Physician Hosie Poisson, MD  Outpatient Primary MD for the patient is [name]  LOS - 2   Chief Complaint  Patient presents with  . Fall   Admission history of present illness/brief narrative: 69 year old male patient with  history combined systolic and diastolic heart failure,iron deficiency anemia,  prior GI bleed in setting of gastric AVMs, CAD status post CABG, chronic kidney disease stage III, pulmonary hypertension, polysubstance abuse and history of homelessness. recently discharged  on 3/7 after being treated for acute on chronic combined systolic and diastolic heart failure. During that admission his hemoglobin had decreased to 7.4 and he was given 1 unit of packed red blood cells. At time of discharge hemoglobin was 8.3. Returns to the ER with complaints of fall. This patient also has chronic back pain and presented with complaints of back pain and  bilateral leg pain that pain and cough. He reports that he fell because his legs were hurting. Patient was seen after arrival to this facility as a direct admission. He was in no acute distress. He was reporting the above symptoms . He also is reporting low back pain and is requesting medication for pain. He reported dark stools. He denies orthopnea, he reports shortness of breath occurring in "spells". On exam it was noted that he had house arrest bracelet on his right ankle; states he does need to notify the police that he has been admitted to the hospital. Laboratory data from the ER reveals sodium 133, normal renal function for this patient, BNP 1732, normal troponin, hemoglobin of 6.6 with hematocrit of 22.2 and a platelet count of 407,000. Stool was Hemoccult positive. Chest x-ray with  moderate vascular congestion. CT cervical spine as well as plain x-ray lumbar spine without evidence of acute traumatic injury post fall  Subjective:   Charles [name] today has, No headache, No chest pain, No abdominal pain - No Nausea, No new weakness tingling or numbness, No Cough - SOB.   Assessment & Plan    Principal Problem:   Symptomatic anemia Active Problems:   Iron deficiency anemia   Hypertension   Homelessness   Moderate to severe pulmonary hypertension   Polysubstance abuse   DDD (degenerative disc disease), lumbar   Seizure disorder   Leg pain, bilateral   Acute on chronic combined systolic and diastolic heart failure   Gastric AVM   ICM- new drop in EF-40-45%- 2D 11/27/13  Symptomatic anemia / history of gastric AVMs -Transfused 2 units packed red blood cells 3/19 - GI consult appreciated - Continue with Protonix 40 mg oral daily - Workup significant for iron deficiency anemia, as low iron and ferritin level, iron supplements on discharge. - Endoscopy 3/21, with nonbleeding AVM with APC treatment to AVMs in stomach. - Check CBC in a.m..   Acute on chronic combined systolic and diastolic heart failure/ICM EF 40% -Mildly decompensated as evidenced by abnormal chest x-ray and mild increase in dyspnea on exertion -Continue with Lasix. -Continue with lisinopril, no beta blockers giving his cocaine use.    Hypertension -Continue home medications: Norvasc, Catapres, Lasix, Apresoline, Imdur, and Zestril  Moderate to severe pulmonary hypertension -Continue nitrates and hydralazine   Seizure disorder -Continue Dilantin -Level is less than 2.5, most likely due to noncompliance.   Homelessness/ Polysubstance abuse - Urine drug screen positive for cocaine, opiates, benzodiazepines - Education officer, museum consult  Hyperlipidemia - Continue with statin Code Status: Full  Family Communication: None at bedside  Disposition Plan: For home in a.m.   Procedures    Endoscopy on 3/21 with AVM in stomach status post APC   Consults   Gastroenterology   Medications  Scheduled Meds: . sodium chloride   Intravenous Once  . amLODipine  10 mg Oral Daily  . Chlorhexidine Gluconate Cloth  6 each Topical Q0600  . citalopram  20 mg Oral Daily  . cloNIDine  0.1 mg Oral BID  . docusate sodium  100 mg Oral BID  . feeding supplement (ENSURE COMPLETE)  237 mL Oral TID WC  . ferrous sulfate  325 mg Oral BID WC  . gabapentin  100 mg Oral TID  . hydrALAZINE  50 mg Oral TID  . isosorbide mononitrate  30 mg Oral Daily  . lisinopril  2.5 mg Oral Daily  . mupirocin ointment  1 application Nasal BID  . pantoprazole  40 mg Oral Daily  . phenytoin  200 mg Oral Daily  . phenytoin  300 mg Oral QHS  . pravastatin  40 mg Oral q1800  . sodium chloride  3 mL Intravenous Q12H   Continuous Infusions:  PRN Meds:.acetaminophen **OR** acetaminophen, albuterol, alum & mag hydroxide-simeth, HYDROcodone-acetaminophen, morphine injection, nitroGLYCERIN, ondansetron **OR** ondansetron (ZOFRAN) IV, oxyCODONE, polyethylene glycol  DVT Prophylaxis   SCDs   Lab Results  Component Value Date   PLT 333 06/08/2014    Antibiotics    Anti-infectives    None          Objective:   Filed Vitals:   06/08/14 1105 06/08/14 1110 06/08/14 1131 06/08/14 1435  BP: 188/106 182/94 155/87 162/76  Pulse: 69 66 60 69  Temp:   97.6 F (36.4 C) 98 F (36.7 C)  TempSrc:   Oral Oral  Resp: 20 18 16 18   Height:      Weight:      SpO2: 98% 93% 97% 95%    Wt Readings from Last 3 Encounters:  06/08/14 77.6 kg (171 lb 1.2 oz)  05/25/14 76.658 kg (169 lb)  04/30/14 75.751 kg (167 lb)     Intake/Output Summary (Last 24 hours) at 06/08/14 1643 Last data filed at 06/08/14 1455  Gross per 24 hour  Intake   1067 ml  Output    400 ml  Net    667 ml     Physical Exam  Awake Alert, Oriented X 3, No new F.N deficits, Normal affect [place].AT,PERRAL Supple Neck,No JVD, No cervical  lymphadenopathy appriciated.  Symmetrical Chest wall movement, Good air movement bilaterally, CTAB RRR,No Gallops,Rubs or new Murmurs, No Parasternal Heave +ve B.Sounds, Abd Soft, No tenderness, No organomegaly appriciated, No rebound - guarding or rigidity. No Cyanosis, Clubbing or edema, No new Rash or bruise     Data Review   Micro Results Recent Results (from the past 240 hour(s))  MRSA PCR Screening     Status: Abnormal   Collection Time: 06/06/14  6:24 PM  Result Value Ref Range Status   MRSA by PCR POSITIVE (A) NEGATIVE Final    Comment:        The GeneXpert MRSA Assay (FDA approved for NASAL specimens only), is one component of  a comprehensive MRSA colonization surveillance program. It is not intended to diagnose MRSA infection nor to guide or monitor treatment for MRSA infections. RESULT CALLED TO, READ BACK BY AND VERIFIED WITH: Loney Hering RN AY 2053 06/06/14 BY [facility]     Radiology Reports No results found.  CBC  Recent Labs Lab 06/06/14 0835 06/06/14 0950 06/07/14 1029 06/07/14 1930 06/08/14 0609  WBC 5.3  --  6.5  --  7.1  HGB 6.6* 6.5* 8.0* 8.0* 8.2*  HCT 22.2* 22.1* 26.2* 25.6* 27.0*  PLT 407*  --  330  --  333  MCV 83.5  --  84.2  --  85.2  MCH 24.8*  --  25.7*  --  25.9*  MCHC 29.7*  --  30.5  --  30.4  RDW 17.4*  --  16.2*  --  16.8*  LYMPHSABS 0.6*  --   --   --   --   MONOABS 0.6  --   --   --   --   EOSABS 0.1  --   --   --   --   BASOSABS 0.0  --   --   --   --     Chemistries   Recent Labs Lab 06/06/14 0835 06/07/14 1400 06/08/14 0609  NA 133* 134* 136  K 4.5 4.0 4.4  CL 102 101 104  CO2 21 22 25   GLUCOSE 90 131* 87  BUN 23 24* 25*  CREATININE 1.32 1.45* 1.30  CALCIUM 8.6 8.3* 8.4  AST  --  45*  --   ALT  --  21  --   ALKPHOS  --  99  --   BILITOT  --  1.7*  --    ------------------------------------------------------------------------------------------------------------------ estimated creatinine clearance is  50.8 mL/min (by C-G formula based on Cr of 1.3). ------------------------------------------------------------------------------------------------------------------ No results for input(s): HGBA1C in the last 72 hours. ------------------------------------------------------------------------------------------------------------------ No results for input(s): CHOL, HDL, LDLCALC, TRIG, CHOLHDL, LDLDIRECT in the last 72 hours. ------------------------------------------------------------------------------------------------------------------ No results for input(s): TSH, T4TOTAL, T3FREE, THYROIDAB in the last 72 hours.  Invalid input(s): FREET3 ------------------------------------------------------------------------------------------------------------------  Recent Labs  06/06/14 1721  VITAMINB12 736  FOLATE >20.0  FERRITIN 46  TIBC 397  IRON 10*  RETICCTPCT 2.7    Coagulation profile  Recent Labs Lab 06/07/14 1400  INR 1.37    No results for input(s): DDIMER in the last 72 hours.  Cardiac Enzymes  Recent Labs Lab 06/06/14 0835  TROPONINI <0.03   ------------------------------------------------------------------------------------------------------------------ Invalid input(s): POCBNP     Time Spent in minutes   35 minutes   Charles [name] M.D on 06/08/2014 at 4:43 PM  Between 7am to 7pm - Pager - [phone]  After 7pm go to [email or web address] - password [email or web address]  And look for the night coverage person covering for me after hours  [facility] Office  [phone]   **Disclaimer: This note may have been dictated with voice recognition software. Similar sounding words can inadvertently be transcribed and this note may contain transcription errors which may not have been corrected upon publication of note.**

## 2014-06-08 NOTE — Op Note (Signed)
[facility] [place], [id number]   ENDOSCOPY PROCEDURE REPORT  PATIENT: Charles, [name]  MR#: [id number] BIRTHDATE: Mar 20, 1946 , 68  yrs. old GENDER: male ENDOSCOPIST: Milus Banister, MD PROCEDURE DATE:  06/08/2014 PROCEDURE:  Enteroscopy with APC treatment to AVM in stomach ASA CLASS:     Class IV INDICATIONS:  chronic anemia (many years), intermittent FOBT + stools, multiple procedures over several years by many GI providers, h/o SB AVMs; ongoing polysubstance abuse. MEDICATIONS: Monitored anesthesia care TOPICAL ANESTHETIC: none  DESCRIPTION OF PROCEDURE: After the risks benefits and alternatives of the procedure were thoroughly explained, informed consent was obtained.  The [id number] ([id number]) endoscope was introduced through the mouth and advanced to the second portion of the duodenum , Without limitations.  The instrument was slowly withdrawn as the mucosa was fully examined.  Using a pediatric colonoscope, the UGI tract was evaluated to the proximal jejunum.  There was no recent or old blood present.  There was a single 25mm, cherry red, typical appearing AVM in the gastric body.  This was treated to destruction with application of APC. During treatment the lesion oozed blood.  The examination was otherwise normal.  Retroflexed views revealed no abnormalities. The scope was then withdrawn from the patient and the procedure completed.  COMPLICATIONS: There were no immediate complications.  ENDOSCOPIC IMPRESSION: Using a pediatric colonoscope, the UGI tract was evaluated to the proximal jejunum.  There was no recent or old blood present.  There was a single 62mm, cherry red, typical appearing AVM in the gastric body.  This was treated to destruction with application of APC. During treatment the lesion oozed blood.  The examination was otherwise normal  RECOMMENDATIONS: Follow clinically, serial blood counts. OK to d/c home  later today from a GI perspective.   eSigned:  Milus Banister, MD 06/08/2014 10:42 AM

## 2014-06-08 NOTE — Anesthesia Preprocedure Evaluation (Signed)
Anesthesia Evaluation  Patient identified by MRN, date of birth, ID band Patient awake    Airway Mallampati: I       Dental   Pulmonary Current Smoker,  breath sounds clear to auscultation        Cardiovascular hypertension, + angina + Past MI and +CHF Rhythm:Regular Rate:Normal     Neuro/Psych Seizures -,     GI/Hepatic (+)     substance abuse  , GI bleed   Endo/Other  diabetes  Renal/GU Renal disease     Musculoskeletal  (+) Arthritis -,   Abdominal   Peds  Hematology   Anesthesia Other Findings   Reproductive/Obstetrics                             Anesthesia Physical Anesthesia Plan  ASA: IV  Anesthesia Plan: MAC   Post-op Pain Management:    Induction: Intravenous  Airway Management Planned: Natural Airway and Nasal Cannula  Additional Equipment:   Intra-op Plan:   Post-operative Plan:   Informed Consent: I have reviewed the patients History and Physical, chart, labs and discussed the procedure including the risks, benefits and alternatives for the proposed anesthesia with the patient or authorized representative who has indicated his/her understanding and acceptance.   Dental advisory given  Plan Discussed with:   Anesthesia Plan Comments:         Anesthesia Quick Evaluation

## 2014-06-08 NOTE — Anesthesia Postprocedure Evaluation (Signed)
  Anesthesia Post-op Note  Patient: Charles [name]  Procedure(s) Performed: Procedure(s): ESOPHAGOGASTRODUODENOSCOPY (EGD) WITH PROPOFOL (N/A)  Patient Location: Endoscopy Unit  Anesthesia Type:MAC  Level of Consciousness: awake and alert   Airway and Oxygen Therapy: Patient Spontanous Breathing  Post-op Pain: none  Post-op Assessment: Post-op Vital signs reviewed  Post-op Vital Signs: stable  Last Vitals:  Filed Vitals:   06/08/14 1131  BP: 155/87  Pulse: 60  Temp: 36.4 C  Resp: 16    Complications: No apparent anesthesia complications

## 2014-06-08 NOTE — Interval H&P Note (Signed)
History and Physical Interval Note:  06/08/2014 10:10 AM  Charles [name]  has presented today for surgery, with the diagnosis of Anemia and heme positive stoolsl; history of AVM's  The various methods of treatment have been discussed with the patient and family. After consideration of risks, benefits and other options for treatment, the patient has consented to  Procedure(s): ESOPHAGOGASTRODUODENOSCOPY (EGD) WITH PROPOFOL (N/A) as a surgical intervention .  The patient's history has been reviewed, patient examined, no change in status, stable for surgery.  I have reviewed the patient's chart and labs.  Questions were answered to the patient's satisfaction.     Milus Banister

## 2014-06-08 NOTE — Transfer of Care (Addendum)
Immediate Anesthesia Transfer of Care Note  Patient: Charles [name]  Procedure(s) Performed: Procedure(s): ESOPHAGOGASTRODUODENOSCOPY (EGD) WITH PROPOFOL (N/A)  Patient Location: Endoscopy Unit  Anesthesia Type:MAC  Level of Consciousness: awake, alert , oriented and patient cooperative  Airway & Oxygen Therapy: Patient Spontanous Breathing and Patient connected to face mask oxygen  Post-op Assessment: Report given to RN and Post -op Vital signs reviewed and stable  Post vital signs: Reviewed and stable  Last Vitals:  Filed Vitals:   06/08/14 1050  BP:   Pulse: 69  Temp:   Resp: 17    Complications: No apparent anesthesia complications

## 2014-06-09 ENCOUNTER — Encounter (HOSPITAL_COMMUNITY)
Admission: EM | Disposition: A | Discharge status: Home / Self Care | Payer: Self-pay | Source: Home / Self Care | Attending: Internal Medicine

## 2014-06-09 ENCOUNTER — Encounter (HOSPITAL_COMMUNITY): Payer: Self-pay | Admitting: Gastroenterology

## 2014-06-09 LAB — BASIC METABOLIC PANEL
ANION GAP: 9 (ref 5–15)
BUN: 24 mg/dL — ABNORMAL HIGH (ref 6–23)
CO2: 25 mmol/L (ref 19–32)
Calcium: 8.4 mg/dL (ref 8.4–10.5)
Chloride: 101 mmol/L (ref 96–112)
Creatinine, Ser: 1.17 mg/dL (ref 0.50–1.35)
GFR calc Af Amer: 71 mL/min — ABNORMAL LOW (ref >90)
GFR calc non Af Amer: 61 mL/min — ABNORMAL LOW (ref >90)
Glucose, Bld: 93 mg/dL (ref 70–99)
POTASSIUM: 4.6 mmol/L (ref 3.5–5.1)
SODIUM: 135 mmol/L (ref 135–145)

## 2014-06-09 LAB — CBC
HEMATOCRIT: 27.4 % — AB (ref 39.0–52.0)
Hemoglobin: 8.2 g/dL — ABNORMAL LOW (ref 13.0–17.0)
MCH: 25.5 pg — ABNORMAL LOW (ref 26.0–34.0)
MCHC: 29.9 g/dL — AB (ref 30.0–36.0)
MCV: 85.4 fL (ref 78.0–100.0)
PLATELETS: 337 10*3/uL (ref 150–400)
RBC: 3.21 MIL/uL — ABNORMAL LOW (ref 4.22–5.81)
RDW: 17.3 % — AB (ref 11.5–15.5)
WBC: 6.4 10*3/uL (ref 4.0–10.5)

## 2014-06-09 SURGERY — LOOP RECORDER IMPLANT

## 2014-06-09 NOTE — Progress Notes (Signed)
CARE MANAGEMENT NOTE 06/09/2014  Patient:  Charles [name], Charles [name]   Account Number:  [id number]  Date Initiated:  06/09/2014  Documentation initiated by:  [facility]  Subjective/Objective Assessment:   Symptomatic anemia     Action/Plan:   Anticipated DC Date:  06/09/2014   Anticipated DC Plan:  [place]  CM consult      Choice offered to / List presented to:             Status of service:  Completed, signed off Medicare Important Message given?  YES (If response is "NO", the following Medicare IM given date fields will be blank) Date Medicare IM given:  06/09/2014 Medicare IM given by:  [facility] Date Additional Medicare IM given:   Additional Medicare IM given by:    Discharge Disposition:  HOME/SELF CARE  Per UR Regulation:    If discussed at Long Length of Stay Meetings, dates discussed:    Comments:  06/09/2014 1255 Lives at home with dtr, Charles [name] # [phone]. Jonnie Finner RN CCM Case Mgmt [phone]

## 2014-06-09 NOTE — Progress Notes (Signed)
Reviewed discharge paperwork with pt.  PIVs removed.  Pt requesting voucher for cab.  Notified SW.  Will continue to monitor until discharged.

## 2014-06-09 NOTE — Progress Notes (Signed)
Patient being inappropriate with staff. Several sexual statements made to NT this shift. Patient asked NT "have you ever had an orgasm?" "Has anyone ever put their tongue on your other lips?". Patient has made repeated requests for NT to lay in his bed with him. Patient has made repeated requests for crackers, coffee and other items to get NT to come to his room. Agricultural consultant and NT went to room together to talk with patient about his inappropriate behaviors and request that he refrain from comments and questions of a sexual or inappropriate nature going forward.

## 2014-06-09 NOTE — Discharge Summary (Signed)
Charles [name], 69 y.o., DOB 11/15/1945, MRN [id number]. Admission date: 06/06/2014 Discharge Date 06/09/2014 Primary MD [name] Admitting Physician Hosie Poisson, MD   PCP please follow-up on: - Please check CBC during his visit, hemoglobin on discharge 8.2.  Admission Diagnosis  Chronic combined systolic and diastolic heart failure [I95.18] Occult GI bleeding [R19.5] Anemia due to other cause [D64.89]  Discharge Diagnosis   Principal Problem:   Symptomatic anemia Active Problems:   Iron deficiency anemia   Hypertension   Homelessness   Moderate to severe pulmonary hypertension   Polysubstance abuse   DDD (degenerative disc disease), lumbar   Seizure disorder   Leg pain, bilateral   Acute on chronic combined systolic and diastolic heart failure   Gastric AVM   ICM- new drop in EF-40-45%- 2D 11/27/13   Past Medical History  Diagnosis Date  . Iron deficiency anemia   . Chronic combined systolic and diastolic CHF, NYHA class 2     a. 11/2013 Echo: EF 40-45%, basl-mid inflat AK, Gr 2 DD.  [name] Hyperlipidemia LDL goal <70   . Obstructive sleep apnea     a. not on home CPAP  . Ischemic cardiomyopathy     a. 11/2013 Echo: EF 40-45%, basl-mid inflat AK, Gr 2 DD, mild AI, mod dil LA/RA, mod reduced RV fxn, PASP 51mmHg.  [name] Homelessness   . Moderate to severe pulmonary hypertension 03/2010    a. PA peak pressure of 76 mmHg (per 2D Echo 03/2010)  . Polysubstance abuse     a. cocaine, THC  . AV malformation of gastrointestinal tract     a. w/ h/o GIB.  [name] Family history of early CAD   . DDD (degenerative disc disease), lumbar   . Peripheral vascular disease   . Seizure disorder   . Hypertension   . CAD (coronary artery disease)     a. s/p 3-vessel CABG (12/2007) // 100% RCA stenosis with collaterals from left system. Severe bifurcation lesions of proxima CXA and OM. Moderate LAD disease - followed by Dr. Wyline Copas in [facility];  b. 11/2013 Myoview: Large fixed inf defect w/o  ischemia, EF 35%.  . Diabetes   . Chronic kidney disease (CKD), stage III (moderate)     a. BL SCr 1.5-1.6  . Renal artery stenosis   . Seizures     Past Surgical History  Procedure Laterality Date  . Apc  03/2010    To treat small bowel AVMs  . Esophagogastroduodenoscopy N/A 08/14/2012    Procedure: ESOPHAGOGASTRODUODENOSCOPY (EGD);  Surgeon: [name], MD;  Location: [facility] ENDOSCOPY;  Service: Endoscopy;  Laterality: N/A;  . Hot hemostasis N/A 08/14/2012    Procedure: HOT HEMOSTASIS (ARGON PLASMA COAGULATION/BICAP);  Surgeon: [name], MD;  Location: [facility] ENDOSCOPY;  Service: Endoscopy;  Laterality: N/A;  . Esophagogastroduodenoscopy (egd) with propofol N/A 08/04/2013    Procedure: ESOPHAGOGASTRODUODENOSCOPY (EGD) WITH PROPOFOL;  Surgeon: [name], MD;  Location: [facility] ENDOSCOPY;  Service: Endoscopy;  Laterality: N/A;  . Coronary artery bypass graft  12/2007  . Esophagogastroduodenoscopy (egd) with propofol N/A 06/08/2014    Procedure: ESOPHAGOGASTRODUODENOSCOPY (EGD) WITH PROPOFOL;  Surgeon: Milus Banister, MD;  Location: [place];  Service: Endoscopy;  Laterality: N/A;     Hospital Course See H&P, Labs, Consult and Test reports for all details in brief, patient was admitted for **  Principal Problem:   Symptomatic anemia Active Problems:   Iron deficiency anemia   Hypertension   Homelessness   Moderate to severe pulmonary hypertension  Polysubstance abuse   DDD (degenerative disc disease), lumbar   Seizure disorder   Leg pain, bilateral   Acute on chronic combined systolic and diastolic heart failure   Gastric AVM   ICM- new drop in EF-40-45%- 2D 11/27/13  Admission history of present illness/brief narrative: 69 year old male patient with history combined systolic and diastolic heart failure,iron deficiency anemia, prior GI bleed in setting of gastric AVMs, CAD status post CABG, chronic kidney disease stage III, pulmonary hypertension, polysubstance abuse and  history of homelessness. recently discharged on 3/7 after being treated for acute on chronic combined systolic and diastolic heart failure. During that admission his hemoglobin had decreased to 7.4 and he was given 1 unit of packed red blood cells. At time of discharge hemoglobin was 8.3. Returns to the ER with complaints of fall. This patient also has chronic back pain and presented with complaints of back pain and bilateral leg pain that pain and cough. He reports that he fell because his legs were hurting. Patient was seen after arrival to this facility as a direct admission. He was in no acute distress. He was reporting the above symptoms . He also is reporting low back pain and is requesting medication for pain. He reported dark stools. He denies orthopnea, he reports shortness of breath occurring in "spells". On exam it was noted that he had house arrest bracelet on his right ankle; states he does need to notify the police that he has been admitted to the hospital. Laboratory data from the ER reveals sodium 133, normal renal function for this patient, BNP 1732, normal troponin, hemoglobin of 6.6 with hematocrit of 22.2 and a platelet count of 407,000. Stool was Hemoccult positive. Chest x-ray with moderate vascular congestion. CT cervical spine as well as plain x-ray lumbar spine without evidence of acute traumatic injury post fall   Symptomatic anemia / history of gastric AVMs -Transfused 2 units packed red blood cells 3/19 - Continue with Protonix 40 mg oral daily - Workup significant for iron deficiency anemia, as low iron and ferritin level, continue with iron supplements on discharge. - Endoscopy 3/21, with nonbleeding AVM with APC treatment to AVMs in stomach, hemoglobin remained stable post transfusion.    Acute on chronic combined systolic and diastolic heart failure/ICM EF 40% -Mildly decompensated as evidenced by abnormal chest x-ray and mild increase in dyspnea on exertion -Continue  with Lasix on discharge. -Continue with lisinopril, beta blockers were held on discharge giving his cocaine abuse, patient tested positive for cocaine 14 times  in the last year.    Hypertension -Initially on the lower side, currently elevated, resume home medication on discharge except Coreg.   Moderate to severe pulmonary hypertension -Continue nitrates and hydralazine   Seizure disorder -Continue Dilantin -Level is less than 2.5 on admission, most likely due to noncompliance.   Homelessness/ Polysubstance abuse - Urine drug screen positive for cocaine, opiates, benzodiazepines - Patient was counseled  Hyperlipidemia - Continue with statin    Procedures  Endoscopy on 3/21 with AVM in stomach status post APC PRBC transfusion  Significant Tests:  See full reports for all details    Dg Chest 2 View  06/06/2014   CLINICAL DATA:  Patient fell at home twice this morning, history of hypertension coronary artery disease diabetes  EXAM: CHEST  2 VIEW  COMPARISON:  05/20/2014  FINDINGS: Severe cardiac enlargement stable. Status post prior CABG. Moderate vascular congestion. Mild interstitial prominence. No consolidation or effusion. Osseous thorax intact.  IMPRESSION: Similar to  prior study there is evidence of congestive heart failure.   Electronically Signed   By: Skipper Cliche M.D.   On: 06/06/2014 08:44   Dg Lumbar Spine Complete  06/06/2014   CLINICAL DATA:  Recent falls with low back pain and radiculopathy  EXAM: LUMBAR SPINE - COMPLETE 4+ VIEW  COMPARISON:  None.  FINDINGS: Five lumbar type vertebral bodies are well visualized. Vertebral body height is well maintained. No spondylolysis or spondylolisthesis is seen. Osteophytic changes are noted with disc space narrowing at L4-5 and to a lesser degree at L3-4. Diffuse aortic calcifications are noted without aneurysmal dilatation.  IMPRESSION: Degenerative change without acute abnormality.   Electronically Signed   By: Inez Catalina M.D.   On: 06/06/2014 08:45   Ct Cervical Spine Wo Contrast  06/06/2014   CLINICAL DATA:  Recent fall with neck pain, initial encounter  EXAM: CT CERVICAL SPINE WITHOUT CONTRAST  TECHNIQUE: Multidetector CT imaging of the cervical spine was performed without intravenous contrast. Multiplanar CT image reconstructions were also generated.  COMPARISON:  None.  FINDINGS: Seven cervical segments are well visualized. Mild osteophytic changes are noted particularly at C5-6 and C6-7. Facet hypertrophic changes are noted. No findings to suggest acute fracture or acute facet abnormality are noted. Heavy carotid calcifications are noted bilaterally.  IMPRESSION: Multilevel degenerative change without acute abnormality.   Electronically Signed   By: Inez Catalina M.D.   On: 06/06/2014 09:00   Dg Chest Port 1 View  05/20/2014   CLINICAL DATA:  Cough and weakness, known heart failure  EXAM: PORTABLE CHEST - 1 VIEW  COMPARISON:  04/26/2014  FINDINGS: Cardiac shadow remains enlarged. Postsurgical changes are seen. Vascular congestion with pulmonary edema is noted consistent with CHF. No bony abnormality is noted.  IMPRESSION: Changes consistent with CHF.   Electronically Signed   By: Inez Catalina M.D.   On: 05/20/2014 10:03     Today   Subjective:   Charles [name] today has no headache,no chest abdominal pain,no new weakness tingling or numbness, feels much better today.  Objective:   Blood pressure 130/55, pulse 71, temperature 98 F (36.7 C), temperature source Oral, resp. rate 17, height 5\' 5"  (1.651 m), weight 78.563 kg (173 lb 3.2 oz), SpO2 94 %.  Intake/Output Summary (Last 24 hours) at 06/09/14 1114 Last data filed at 06/09/14 1007  Gross per 24 hour  Intake   1434 ml  Output   1565 ml  Net   -131 ml    Exam Awake Alert, Oriented *3, No new F.N deficits, Normal affect [place].AT,PERRAL Supple Neck,No JVD, No cervical lymphadenopathy appriciated.  Symmetrical Chest wall movement, Good air  movement bilaterally, CTAB RRR,No Gallops,Rubs or new Murmurs, No Parasternal Heave +ve B.Sounds, Abd Soft, Non tender, No organomegaly appriciated, No rebound -guarding or rigidity. No Cyanosis, Clubbing or edema, No new Rash or bruise  Data Review   Cultures -   CBC w Diff: Lab Results  Component Value Date   WBC 6.4 06/09/2014   HGB 8.2* 06/09/2014   HCT 27.4* 06/09/2014   PLT 337 06/09/2014   LYMPHOPCT 12 06/06/2014   MONOPCT 11 06/06/2014   EOSPCT 2 06/06/2014   BASOPCT 1 06/06/2014   CMP: Lab Results  Component Value Date   NA 135 06/09/2014   K 4.6 06/09/2014   CL 101 06/09/2014   CO2 25 06/09/2014   BUN 24* 06/09/2014   CREATININE 1.17 06/09/2014   CREATININE 1.32 05/11/2010   PROT 7.3 06/07/2014  ALBUMIN 2.8* 06/07/2014   BILITOT 1.7* 06/07/2014   ALKPHOS 99 06/07/2014   AST 45* 06/07/2014   ALT 21 06/07/2014  .  Micro Results Recent Results (from the past 240 hour(s))  MRSA PCR Screening     Status: Abnormal   Collection Time: 06/06/14  6:24 PM  Result Value Ref Range Status   MRSA by PCR POSITIVE (A) NEGATIVE Final    Comment:        The GeneXpert MRSA Assay (FDA approved for NASAL specimens only), is one component of a comprehensive MRSA colonization surveillance program. It is not intended to diagnose MRSA infection nor to guide or monitor treatment for MRSA infections. RESULT CALLED TO, READ BACK BY AND VERIFIED WITH: Loney Hering RN AY [id number] 06/06/14 BY [facility]      Discharge Instructions      Follow-up Information    Follow up with [name]. Schedule an appointment as soon as possible for a visit in 1 week.   Specialty:  Family Medicine   Why:  Posthospitalization follow-up for GI bleed   Contact information:   [address] [address] [phone]       Discharge Medications     Medication List    STOP taking these medications        aspirin EC 81 MG tablet     carvedilol 12.5 MG tablet   Commonly known as:  COREG      TAKE these medications        albuterol 108 (90 BASE) MCG/ACT inhaler  Commonly known as:  PROVENTIL HFA;VENTOLIN HFA  Inhale 2 puffs into the lungs every 6 (six) hours as needed for wheezing or shortness of breath.     amLODipine 10 MG tablet  Commonly known as:  NORVASC  Take 10 mg by mouth daily.     citalopram 20 MG tablet  Commonly known as:  CELEXA  Take 20 mg by mouth daily.     cloNIDine 0.2 MG tablet  Commonly known as:  CATAPRES  Take 0.5 tablets (0.1 mg total) by mouth 2 (two) times daily.     divalproex 500 MG 24 hr tablet  Commonly known as:  DEPAKOTE ER  Take 500 mg by mouth 2 (two) times daily.     ferrous sulfate 325 (65 FE) MG tablet  Take 325 mg by mouth 2 (two) times daily with a meal.     furosemide 40 MG tablet  Commonly known as:  LASIX  Take 40 mg by mouth daily.     gabapentin 100 MG capsule  Commonly known as:  NEURONTIN  Take 100 mg by mouth 2 (two) times daily.     hydrALAZINE 100 MG tablet  Commonly known as:  APRESOLINE  Take 100 mg by mouth 3 (three) times daily.     isosorbide mononitrate 30 MG 24 hr tablet  Commonly known as:  IMDUR  Take 30 mg by mouth daily.     lisinopril 20 MG tablet  Commonly known as:  PRINIVIL,ZESTRIL  Take 20 mg by mouth 2 (two) times daily.     nitroGLYCERIN 0.4 MG SL tablet  Commonly known as:  NITROSTAT  Place 1 tablet (0.4 mg total) under the tongue every 5 (five) minutes as needed for chest pain (upto max 3 doses at one time.).     pantoprazole 40 MG tablet  Commonly known as:  PROTONIX  Take 40 mg by mouth daily.     phenytoin 100 MG  ER capsule  Commonly known as:  DILANTIN  Take 200-300 mg by mouth 2 (two) times daily. 200mg  in the morning, 300mg  in the evening     pravastatin 40 MG tablet  Commonly known as:  PRAVACHOL  Take 1 tablet (40 mg total) by mouth daily at 6 PM.         Total Time in preparing paper work, data evaluation and todays exam - 35  minutes  Charles [name] M.D on 06/09/2014 at [place]  [phone]

## 2014-06-09 NOTE — Discharge Instructions (Signed)
Follow with Primary MD [name] in 7 days   Get CBC, CMP, 2 view Chest X ray checked  by Primary MD next visit.    Activity: As tolerated with Full fall precautions use walker/cane & assistance as needed   Disposition Home    Diet: Heart Healthy  , with feeding assistance and aspiration precautions as needed.  For Heart failure patients - Check your Weight same time everyday, if you gain over 2 pounds, or you develop in leg swelling, experience more shortness of breath or chest pain, call your Primary MD immediately. Follow Cardiac Low Salt Diet and 1.5 lit/day fluid restriction.   On your next visit with your primary care physician please Get Medicines reviewed and adjusted.   Please request your Prim.MD to go over all Hospital Tests and Procedure/Radiological results at the follow up, please get all Hospital records sent to your Prim MD by signing hospital release before you go home.   If you experience worsening of your admission symptoms, develop shortness of breath, life threatening emergency, suicidal or homicidal thoughts you must seek medical attention immediately by calling 911 or calling your MD immediately  if symptoms less severe.  You Must read complete instructions/literature along with all the possible adverse reactions/side effects for all the Medicines you take and that have been prescribed to you. Take any new Medicines after you have completely understood and accpet all the possible adverse reactions/side effects.   Do not drive, operating heavy machinery, perform activities at heights, swimming or participation in water activities or provide baby sitting services if your were admitted for syncope or siezures until you have seen by Primary MD or a Neurologist and advised to do so again.  Do not drive when taking Pain medications.    Do not take more than prescribed Pain, Sleep and Anxiety Medications  Special Instructions: If you have smoked or chewed Tobacco   in the last 2 yrs please stop smoking, stop any regular Alcohol  and or any Recreational drug use.  Wear Seat belts while driving.   Please note  You were cared for by a hospitalist during your hospital stay. If you have any questions about your discharge medications or the care you received while you were in the hospital after you are discharged, you can call the unit and asked to speak with the hospitalist on call if the hospitalist that took care of you is not available. Once you are discharged, your primary care physician will handle any further medical issues. Please note that NO REFILLS for any discharge medications will be authorized once you are discharged, as it is imperative that you return to your primary care physician (or establish a relationship with a primary care physician if you do not have one) for your aftercare needs so that they can reassess your need for medications and monitor your lab values.

## 2014-06-09 NOTE — Clinical Social Work Note (Signed)
Clinical Social Worker arranged transport home via cab.  CSW spoke with patient at bedside and exhausted all other avenues prior to providing cab voucher.  Patient confirmed his address and states that his daughter will be present to let him inside.  No further social work needs at this time.  Barbette Or, [place]

## 2014-07-03 ENCOUNTER — Encounter (HOSPITAL_COMMUNITY): Payer: Self-pay | Admitting: Emergency Medicine

## 2014-07-03 ENCOUNTER — Inpatient Hospital Stay (HOSPITAL_COMMUNITY): Payer: Medicare Other

## 2014-07-03 ENCOUNTER — Inpatient Hospital Stay (HOSPITAL_COMMUNITY)
Admission: EM | Admit: 2014-07-03 | Discharge: 2014-07-05 | DRG: 293 | Disposition: A | Discharge status: Home / Self Care | Payer: Medicare Other | Attending: Internal Medicine | Admitting: Internal Medicine

## 2014-07-03 ENCOUNTER — Emergency Department (HOSPITAL_COMMUNITY): Payer: Medicare Other

## 2014-07-03 DIAGNOSIS — E119 Type 2 diabetes mellitus without complications: Secondary | ICD-10-CM | POA: Diagnosis present

## 2014-07-03 DIAGNOSIS — I129 Hypertensive chronic kidney disease with stage 1 through stage 4 chronic kidney disease, or unspecified chronic kidney disease: Secondary | ICD-10-CM | POA: Diagnosis present

## 2014-07-03 DIAGNOSIS — I5043 Acute on chronic combined systolic (congestive) and diastolic (congestive) heart failure: Principal | ICD-10-CM

## 2014-07-03 DIAGNOSIS — N183 Chronic kidney disease, stage 3 unspecified: Secondary | ICD-10-CM | POA: Diagnosis present

## 2014-07-03 DIAGNOSIS — I701 Atherosclerosis of renal artery: Secondary | ICD-10-CM | POA: Diagnosis present

## 2014-07-03 DIAGNOSIS — I739 Peripheral vascular disease, unspecified: Secondary | ICD-10-CM | POA: Diagnosis present

## 2014-07-03 DIAGNOSIS — Z951 Presence of aortocoronary bypass graft: Secondary | ICD-10-CM

## 2014-07-03 DIAGNOSIS — G40909 Epilepsy, unspecified, not intractable, without status epilepticus: Secondary | ICD-10-CM | POA: Diagnosis present

## 2014-07-03 DIAGNOSIS — I2510 Atherosclerotic heart disease of native coronary artery without angina pectoris: Secondary | ICD-10-CM | POA: Diagnosis present

## 2014-07-03 DIAGNOSIS — Z8719 Personal history of other diseases of the digestive system: Secondary | ICD-10-CM | POA: Diagnosis not present

## 2014-07-03 DIAGNOSIS — M5136 Other intervertebral disc degeneration, lumbar region: Secondary | ICD-10-CM | POA: Diagnosis present

## 2014-07-03 DIAGNOSIS — I509 Heart failure, unspecified: Secondary | ICD-10-CM

## 2014-07-03 DIAGNOSIS — K5900 Constipation, unspecified: Secondary | ICD-10-CM | POA: Diagnosis present

## 2014-07-03 DIAGNOSIS — Z79899 Other long term (current) drug therapy: Secondary | ICD-10-CM | POA: Diagnosis not present

## 2014-07-03 DIAGNOSIS — I272 Other secondary pulmonary hypertension: Secondary | ICD-10-CM | POA: Diagnosis present

## 2014-07-03 DIAGNOSIS — F17210 Nicotine dependence, cigarettes, uncomplicated: Secondary | ICD-10-CM | POA: Diagnosis present

## 2014-07-03 DIAGNOSIS — F1910 Other psychoactive substance abuse, uncomplicated: Secondary | ICD-10-CM | POA: Diagnosis not present

## 2014-07-03 DIAGNOSIS — I10 Essential (primary) hypertension: Secondary | ICD-10-CM | POA: Diagnosis not present

## 2014-07-03 DIAGNOSIS — F1410 Cocaine abuse, uncomplicated: Secondary | ICD-10-CM | POA: Diagnosis present

## 2014-07-03 DIAGNOSIS — Z7901 Long term (current) use of anticoagulants: Secondary | ICD-10-CM | POA: Diagnosis not present

## 2014-07-03 LAB — POCT I-STAT TROPONIN I: Troponin i, poc: 0.03 ng/mL (ref 0.00–0.08)

## 2014-07-03 LAB — COMPREHENSIVE METABOLIC PANEL
ALK PHOS: 88 U/L (ref 39–117)
ALT: 26 U/L (ref 0–53)
AST: 43 U/L — ABNORMAL HIGH (ref 0–37)
Albumin: 3.1 g/dL — ABNORMAL LOW (ref 3.5–5.2)
Anion gap: 13 (ref 5–15)
BILIRUBIN TOTAL: 1.1 mg/dL (ref 0.3–1.2)
BUN: 25 mg/dL — ABNORMAL HIGH (ref 6–23)
CO2: 18 mmol/L — AB (ref 19–32)
Calcium: 9 mg/dL (ref 8.4–10.5)
Chloride: 107 mmol/L (ref 96–112)
Creatinine, Ser: 1.47 mg/dL — ABNORMAL HIGH (ref 0.50–1.35)
GFR calc Af Amer: 54 mL/min — ABNORMAL LOW (ref >90)
GFR, EST NON AFRICAN AMERICAN: 47 mL/min — AB (ref >90)
GLUCOSE: 122 mg/dL — AB (ref 70–99)
POTASSIUM: 4.4 mmol/L (ref 3.5–5.1)
Sodium: 138 mmol/L (ref 135–145)
Total Protein: 7.5 g/dL (ref 6.0–8.3)

## 2014-07-03 LAB — CBC WITH DIFFERENTIAL/PLATELET
Basophils Absolute: 0 10*3/uL (ref 0.0–0.1)
Basophils Relative: 0 % (ref 0–1)
Eosinophils Absolute: 0.2 10*3/uL (ref 0.0–0.7)
Eosinophils Relative: 3 % (ref 0–5)
HCT: 29.9 % — ABNORMAL LOW (ref 39.0–52.0)
HEMOGLOBIN: 8.9 g/dL — AB (ref 13.0–17.0)
LYMPHS ABS: 0.8 10*3/uL (ref 0.7–4.0)
LYMPHS PCT: 13 % (ref 12–46)
MCH: 25.2 pg — AB (ref 26.0–34.0)
MCHC: 29.8 g/dL — ABNORMAL LOW (ref 30.0–36.0)
MCV: 84.7 fL (ref 78.0–100.0)
MONOS PCT: 8 % (ref 3–12)
Monocytes Absolute: 0.5 10*3/uL (ref 0.1–1.0)
Neutro Abs: 4.4 10*3/uL (ref 1.7–7.7)
Neutrophils Relative %: 76 % (ref 43–77)
Platelets: 308 10*3/uL (ref 150–400)
RBC: 3.53 MIL/uL — AB (ref 4.22–5.81)
RDW: 17.6 % — ABNORMAL HIGH (ref 11.5–15.5)
WBC: 5.8 10*3/uL (ref 4.0–10.5)

## 2014-07-03 LAB — TROPONIN I
TROPONIN I: 0.05 ng/mL — AB (ref <0.031)
Troponin I: 0.06 ng/mL — ABNORMAL HIGH (ref <0.031)

## 2014-07-03 LAB — LACTIC ACID, PLASMA
LACTIC ACID, VENOUS: 1.8 mmol/L (ref 0.5–2.0)
Lactic Acid, Venous: 1.9 mmol/L (ref 0.5–2.0)

## 2014-07-03 LAB — BRAIN NATRIURETIC PEPTIDE: B Natriuretic Peptide: 3703.7 pg/mL — ABNORMAL HIGH (ref 0.0–100.0)

## 2014-07-03 LAB — RAPID URINE DRUG SCREEN, HOSP PERFORMED
AMPHETAMINES: NOT DETECTED
BARBITURATES: NOT DETECTED
BENZODIAZEPINES: NOT DETECTED
Cocaine: POSITIVE — AB
Opiates: NOT DETECTED
Tetrahydrocannabinol: NOT DETECTED

## 2014-07-03 LAB — I-STAT TROPONIN, ED: Troponin i, poc: 0.03 ng/mL (ref 0.00–0.08)

## 2014-07-03 LAB — I-STAT CG4 LACTIC ACID, ED: LACTIC ACID, VENOUS: 2.13 mmol/L — AB (ref 0.5–2.0)

## 2014-07-03 MED ORDER — ASPIRIN 325 MG PO TABS
325.0000 mg | ORAL_TABLET | Freq: Once | ORAL | Status: AC
  Administered 2014-07-03: 325 mg via ORAL
  Filled 2014-07-03: qty 1

## 2014-07-03 MED ORDER — SODIUM CHLORIDE 0.9 % IJ SOLN
3.0000 mL | Freq: Two times a day (BID) | INTRAMUSCULAR | Status: DC
  Administered 2014-07-03 – 2014-07-04 (×4): 3 mL via INTRAVENOUS

## 2014-07-03 MED ORDER — PRAVASTATIN SODIUM 40 MG PO TABS
40.0000 mg | ORAL_TABLET | Freq: Every day | ORAL | Status: DC
  Administered 2014-07-03 – 2014-07-04 (×2): 40 mg via ORAL
  Filled 2014-07-03 (×3): qty 1

## 2014-07-03 MED ORDER — GABAPENTIN 100 MG PO CAPS
100.0000 mg | ORAL_CAPSULE | Freq: Two times a day (BID) | ORAL | Status: DC
  Administered 2014-07-03 – 2014-07-05 (×5): 100 mg via ORAL
  Filled 2014-07-03 (×6): qty 1

## 2014-07-03 MED ORDER — DIVALPROEX SODIUM ER 500 MG PO TB24
500.0000 mg | ORAL_TABLET | Freq: Two times a day (BID) | ORAL | Status: DC
  Administered 2014-07-03 – 2014-07-05 (×5): 500 mg via ORAL
  Filled 2014-07-03 (×6): qty 1

## 2014-07-03 MED ORDER — PHENYTOIN SODIUM EXTENDED 100 MG PO CAPS
200.0000 mg | ORAL_CAPSULE | Freq: Two times a day (BID) | ORAL | Status: DC
  Filled 2014-07-03 (×2): qty 3

## 2014-07-03 MED ORDER — ONDANSETRON HCL 4 MG/2ML IJ SOLN
4.0000 mg | Freq: Once | INTRAMUSCULAR | Status: AC
  Administered 2014-07-03: 4 mg via INTRAVENOUS
  Filled 2014-07-03 (×2): qty 2

## 2014-07-03 MED ORDER — BISACODYL 10 MG RE SUPP
10.0000 mg | RECTAL | Status: AC
  Administered 2014-07-03: 10 mg via RECTAL
  Filled 2014-07-03: qty 1

## 2014-07-03 MED ORDER — HEPARIN SODIUM (PORCINE) 5000 UNIT/ML IJ SOLN
5000.0000 [IU] | Freq: Three times a day (TID) | INTRAMUSCULAR | Status: DC
  Filled 2014-07-03 (×7): qty 1

## 2014-07-03 MED ORDER — PHENYTOIN SODIUM EXTENDED 100 MG PO CAPS
200.0000 mg | ORAL_CAPSULE | Freq: Every day | ORAL | Status: DC
  Administered 2014-07-03 – 2014-07-05 (×3): 200 mg via ORAL
  Filled 2014-07-03 (×3): qty 2

## 2014-07-03 MED ORDER — CYCLOBENZAPRINE HCL 10 MG PO TABS
5.0000 mg | ORAL_TABLET | Freq: Three times a day (TID) | ORAL | Status: DC | PRN
  Administered 2014-07-03 – 2014-07-04 (×2): 5 mg via ORAL
  Filled 2014-07-03 (×3): qty 1

## 2014-07-03 MED ORDER — CITALOPRAM HYDROBROMIDE 20 MG PO TABS
20.0000 mg | ORAL_TABLET | Freq: Every day | ORAL | Status: DC
  Administered 2014-07-03 – 2014-07-05 (×3): 20 mg via ORAL
  Filled 2014-07-03 (×3): qty 1

## 2014-07-03 MED ORDER — FUROSEMIDE 10 MG/ML IJ SOLN
80.0000 mg | Freq: Once | INTRAMUSCULAR | Status: AC
  Administered 2014-07-03: 80 mg via INTRAVENOUS
  Filled 2014-07-03: qty 8

## 2014-07-03 MED ORDER — SODIUM CHLORIDE 0.9 % IV SOLN: 250.0000 mL | INTRAVENOUS | Status: DC | PRN

## 2014-07-03 MED ORDER — FUROSEMIDE 10 MG/ML IJ SOLN
60.0000 mg | Freq: Four times a day (QID) | INTRAMUSCULAR | Status: DC
  Administered 2014-07-03: 60 mg via INTRAVENOUS
  Filled 2014-07-03: qty 6

## 2014-07-03 MED ORDER — ALBUTEROL SULFATE (2.5 MG/3ML) 0.083% IN NEBU: 2.5000 mg | INHALATION_SOLUTION | Freq: Four times a day (QID) | RESPIRATORY_TRACT | Status: DC | PRN

## 2014-07-03 MED ORDER — ISOSORBIDE MONONITRATE ER 30 MG PO TB24
30.0000 mg | ORAL_TABLET | Freq: Every day | ORAL | Status: DC
  Administered 2014-07-03 – 2014-07-05 (×3): 30 mg via ORAL
  Filled 2014-07-03 (×3): qty 1

## 2014-07-03 MED ORDER — ASPIRIN EC 325 MG PO TBEC
325.0000 mg | DELAYED_RELEASE_TABLET | Freq: Every day | ORAL | Status: DC
  Administered 2014-07-03 – 2014-07-05 (×3): 325 mg via ORAL
  Filled 2014-07-03 (×3): qty 1

## 2014-07-03 MED ORDER — HYDROMORPHONE HCL 1 MG/ML IJ SOLN
1.0000 mg | Freq: Once | INTRAMUSCULAR | Status: AC
  Administered 2014-07-03: 1 mg via INTRAVENOUS
  Filled 2014-07-03: qty 1

## 2014-07-03 MED ORDER — ALBUTEROL SULFATE HFA 108 (90 BASE) MCG/ACT IN AERS: 2.0000 | INHALATION_SPRAY | Freq: Four times a day (QID) | RESPIRATORY_TRACT | Status: DC | PRN

## 2014-07-03 MED ORDER — PHENYTOIN SODIUM EXTENDED 100 MG PO CAPS
300.0000 mg | ORAL_CAPSULE | Freq: Every day | ORAL | Status: DC
  Administered 2014-07-03 – 2014-07-04 (×2): 300 mg via ORAL
  Filled 2014-07-03 (×3): qty 3

## 2014-07-03 MED ORDER — AMLODIPINE BESYLATE 10 MG PO TABS
10.0000 mg | ORAL_TABLET | Freq: Every day | ORAL | Status: DC
  Administered 2014-07-03 – 2014-07-05 (×3): 10 mg via ORAL
  Filled 2014-07-03 (×3): qty 1

## 2014-07-03 MED ORDER — SODIUM CHLORIDE 0.9 % IJ SOLN: 3.0000 mL | INTRAMUSCULAR | Status: DC | PRN

## 2014-07-03 MED ORDER — PANTOPRAZOLE SODIUM 40 MG PO TBEC
40.0000 mg | DELAYED_RELEASE_TABLET | Freq: Every day | ORAL | Status: DC
  Administered 2014-07-03 – 2014-07-05 (×3): 40 mg via ORAL
  Filled 2014-07-03: qty 1

## 2014-07-03 MED ORDER — FUROSEMIDE 10 MG/ML IJ SOLN
60.0000 mg | Freq: Two times a day (BID) | INTRAMUSCULAR | Status: DC
  Administered 2014-07-03 – 2014-07-05 (×4): 60 mg via INTRAVENOUS
  Filled 2014-07-03 (×7): qty 6

## 2014-07-03 MED ORDER — NITROGLYCERIN 0.4 MG SL SUBL
0.4000 mg | SUBLINGUAL_TABLET | SUBLINGUAL | Status: DC | PRN
  Administered 2014-07-03: 0.4 mg via SUBLINGUAL
  Filled 2014-07-03: qty 1

## 2014-07-03 MED ORDER — SORBITOL 70 % SOLN
30.0000 mL | Status: AC
  Administered 2014-07-03: 30 mL via ORAL
  Filled 2014-07-03: qty 30

## 2014-07-03 MED ORDER — LISINOPRIL 20 MG PO TABS
20.0000 mg | ORAL_TABLET | Freq: Two times a day (BID) | ORAL | Status: DC
  Administered 2014-07-03 – 2014-07-05 (×5): 20 mg via ORAL
  Filled 2014-07-03 (×6): qty 1

## 2014-07-03 MED ORDER — NITROGLYCERIN 0.4 MG SL SUBL: 0.4000 mg | SUBLINGUAL_TABLET | SUBLINGUAL | Status: DC | PRN

## 2014-07-03 MED ORDER — SODIUM CHLORIDE 0.9 % IJ SOLN
3.0000 mL | Freq: Two times a day (BID) | INTRAMUSCULAR | Status: DC
  Administered 2014-07-03 – 2014-07-04 (×2): 3 mL via INTRAVENOUS

## 2014-07-03 NOTE — Progress Notes (Signed)
[facility] PROGRESS NOTE  Miliano Cotten AGT:[id number] DOB: 14-Apr-1945 DOA: 07/03/2014 PCP: [name]  Assessment/Plan: 1. 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 Recent 2d echo with EF 45-50% with grade 2 diastolic dysfunction 2. Today's weight 82kg 3. Dry wt 78kg 2. Generalized Pain 1. Events overnight noted 2. Pt reported to have "chest pain" which is more likely generalized pain with questionable EKG changes, which per Cardiology, were "nonspecific changes" 3. Patient continues to complain of generalized pains. Of note, L sided chest pain is very much reproducible on exam. 4. Will give a trial of PRN cyclobenzaprine  3. CAD s/p CABG 1. Stable 2. Unremarkable cardiac enzymes thus far 4. Hx seizures 1. No recurrent seizure activity 2. Cont seizure meds 5. HTN 1. BP stable, albeit suboptimally controlled 2. Pt is in acute discomfort so will monitor for now. If BP remains elevated, would then titrate meds 6. Hx GI bleed 1. hgb stable 2. No signs of active bleed 3. Monitor CBC 7. Polysubstance abuse 1. Pt admits to continued cocaine abuse, last use claimed to be last week 2. UDS still pending 3. Cessation, once again, done at bedside 8. CKD3 1. Cr seems stable 2. Cont to monitor renal function 9. DVT prophylaxis 1. Heparin subQ  Code Status: Full Family Communication: Pt in room Disposition Plan: Pending   Consultants:    Procedures:    Antibiotics:   (indicate start date, and stop date if known)  HPI/Subjective: Complains of abd fullness and generalized pain  Objective: Filed Vitals:   07/03/14 0415 07/03/14 0502 07/03/14 0506 07/03/14 1343  BP: 143/89 99/75 160/108 153/99  Pulse: 86 82  69  Temp:  97.2 F (36.2 C)  98.4 F (36.9 C)  TempSrc:  Oral  Oral  Resp: 22 18  20   Height:  5\' 8"  (1.727 m)    Weight:  81.1 kg (178 lb 12.7 oz)    SpO2: 91% 94%  96%    Intake/Output Summary (Last 24  hours) at 07/03/14 1551 Last data filed at 07/03/14 1551  Gross per 24 hour  Intake   1180 ml  Output    475 ml  Net    705 ml   Filed Weights   07/03/14 0110 07/03/14 0502  Weight: 88.315 kg (194 lb 11.2 oz) 81.1 kg (178 lb 12.7 oz)    Exam:   General:  Awake, in nad  Cardiovascular: regular, s1, s2, L sided chest pain very much reporoducible  Respiratory: normal resp effort, no wheezing  Abdomen: decreased BS, distended  Musculoskeletal: perfused, no clubbing   Data Reviewed: Basic Metabolic Panel:  Recent Labs Lab 07/03/14 0210  NA 138  K 4.4  CL 107  CO2 18*  GLUCOSE 122*  BUN 25*  CREATININE 1.47*  CALCIUM 9.0   Liver Function Tests:  Recent Labs Lab 07/03/14 0210  AST 43*  ALT 26  ALKPHOS 88  BILITOT 1.1  PROT 7.5  ALBUMIN 3.1*   No results for input(s): LIPASE, AMYLASE in the last 168 hours. No results for input(s): AMMONIA in the last 168 hours. CBC:  Recent Labs Lab 07/03/14 0210  WBC 5.8  NEUTROABS 4.4  HGB 8.9*  HCT 29.9*  MCV 84.7  PLT 308   Cardiac Enzymes:  Recent Labs Lab 07/03/14 1116  TROPONINI 0.05*   BNP (last 3 results)  Recent Labs  05/20/14 0947 06/06/14 0835 07/03/14 0210  BNP 2249.6* 1732.2* 3703.7*    ProBNP (last 3 results)  Recent Labs  01/01/14 1107 02/07/14 0123 03/01/14 2250  PROBNP 3225.0* 2648.0* 7421.0*    CBG: No results for input(s): GLUCAP in the last 168 hours.  No results found for this or any previous visit (from the past 240 hour(s)).   Studies: Dg Chest 2 View  07/03/2014   CLINICAL DATA:  Chest pain and shortness of breath  EXAM: CHEST  2 VIEW  COMPARISON:  06/06/2014  FINDINGS: Chronic cardiomegaly. There is pulmonary venous congestion with cephalized blood flow and interstitial coarsening. The patient is status post CABG. Aortic and hilar contours are stable. No effusion or pneumonia.  IMPRESSION: Mild CHF.   Electronically Signed   By: Monte Fantasia M.D.   On: 07/03/2014  02:43    Scheduled Meds: . amLODipine  10 mg Oral Daily  . aspirin EC  325 mg Oral Daily  . citalopram  20 mg Oral Daily  . divalproex  500 mg Oral BID  . furosemide  60 mg Intravenous Q12H  . gabapentin  100 mg Oral BID  . heparin  5,000 Units Subcutaneous 3 times per day  . isosorbide mononitrate  30 mg Oral Daily  . lisinopril  20 mg Oral BID  . pantoprazole  40 mg Oral Daily  . phenytoin  200 mg Oral Daily   And  . phenytoin  300 mg Oral QHS  . pravastatin  40 mg Oral q1800  . sodium chloride  3 mL Intravenous Q12H  . sodium chloride  3 mL Intravenous Q12H   Continuous Infusions:   Principal Problem:   Acute on chronic combined systolic and diastolic CHF (congestive heart failure) Active Problems:   Hypertension   CAD- CABG x3 '09. Myoview no ischemia 11/27/13   Chronic kidney disease (CKD), stage III (moderate)   Polysubstance abuse   Seizure disorder   History of GI bleed   CHF exacerbation   CHF (congestive heart failure)   CHIU, STEPHEN K  [facility] Pager [phone]. If 7PM-7AM, please contact night-coverage at [email or web address], password [facility] 07/03/2014, 3:51 PM  LOS: 0 days

## 2014-07-03 NOTE — ED Notes (Signed)
Report attempted 

## 2014-07-03 NOTE — ED Provider Notes (Signed)
CSN: [id number]     Arrival date & time 07/03/14  0104 History  This chart was scribed for Debby Freiberg, MD by Eustaquio Maize, ED Scribe. This patient was seen in room A01C/A01C and the patient's care was started at 1:56 AM.    Chief Complaint  Patient presents with  . Chest Pain  . Shortness of Breath  . Leg Pain   Patient is a 69 y.o. male presenting with chest pain, shortness of breath, and leg pain. The history is provided by the patient. No language interpreter was used.  Chest Pain Pain quality: sharp   Pain radiates to:  Does not radiate Pain radiates to the back: no   Pain severity:  Severe Onset quality:  Sudden Duration:  12 hours Timing:  Constant Chronicity:  New Associated symptoms: fever, nausea, shortness of breath, vomiting and weakness   Associated symptoms: no cough   Risk factors: coronary artery disease, diabetes mellitus, hypertension and male sex   Shortness of Breath Associated symptoms: chest pain, fever and vomiting   Associated symptoms: no cough and no sore throat   Leg Pain Associated symptoms: fever      HPI Comments: Stephan Nelis is a 69 y.o. male with hx CHF, HLD, Ischemic cardiomyopathy, Pulmonary hypertension, CAD, DM who presents to the Emergency Department complaining of severe, sudden onset chest pain that began yesterday around 2 PM (12 hours ago). He describes the pain as a sharp, constant pain. He denies any modifying factors to the pain. Pt also complains of an aching sensation in his lower extremities, shortness of breath, generalized weakness, nausea, vomiting, and abdominal bloating. He reports that his legs have been giving out of him, causing him to fall 4 times. Pt mentions that he weight 165 last week and now weighs 195 pounds. He denies cough, congestion, sore throat, or any other symptoms.    Past Medical History  Diagnosis Date  . Iron deficiency anemia   . Chronic combined systolic and diastolic CHF, NYHA class 2     a.  11/2013 Echo: EF 40-45%, basl-mid inflat AK, Gr 2 DD.  [name] Hyperlipidemia LDL goal <70   . Obstructive sleep apnea     a. not on home CPAP  . Ischemic cardiomyopathy     a. 11/2013 Echo: EF 40-45%, basl-mid inflat AK, Gr 2 DD, mild AI, mod dil LA/RA, mod reduced RV fxn, PASP 67mmHg.  [name] Homelessness   . Moderate to severe pulmonary hypertension 03/2010    a. PA peak pressure of 76 mmHg (per 2D Echo 03/2010)  . Polysubstance abuse     a. cocaine, THC  . AV malformation of gastrointestinal tract     a. w/ h/o GIB.  [name] Family history of early CAD   . DDD (degenerative disc disease), lumbar   . Peripheral vascular disease   . Seizure disorder   . Hypertension   . CAD (coronary artery disease)     a. s/p 3-vessel CABG (12/2007) // 100% RCA stenosis with collaterals from left system. Severe bifurcation lesions of proxima CXA and OM. Moderate LAD disease - followed by Dr. Wyline Copas in [facility];  b. 11/2013 Myoview: Large fixed inf defect w/o ischemia, EF 35%.  . Diabetes   . Chronic kidney disease (CKD), stage III (moderate)     a. BL SCr 1.5-1.6  . Renal artery stenosis   . Seizures    Past Surgical History  Procedure Laterality Date  . Apc  03/2010    To  treat small bowel AVMs  . Esophagogastroduodenoscopy N/A 08/14/2012    Procedure: ESOPHAGOGASTRODUODENOSCOPY (EGD);  Surgeon: [name], MD;  Location: [facility] ENDOSCOPY;  Service: Endoscopy;  Laterality: N/A;  . Hot hemostasis N/A 08/14/2012    Procedure: HOT HEMOSTASIS (ARGON PLASMA COAGULATION/BICAP);  Surgeon: [name], MD;  Location: [facility] ENDOSCOPY;  Service: Endoscopy;  Laterality: N/A;  . Esophagogastroduodenoscopy (egd) with propofol N/A 08/04/2013    Procedure: ESOPHAGOGASTRODUODENOSCOPY (EGD) WITH PROPOFOL;  Surgeon: [name], MD;  Location: [facility] ENDOSCOPY;  Service: Endoscopy;  Laterality: N/A;  . Coronary artery bypass graft  12/2007  . Esophagogastroduodenoscopy (egd) with propofol N/A 06/08/2014    Procedure:  ESOPHAGOGASTRODUODENOSCOPY (EGD) WITH PROPOFOL;  Surgeon: Milus Banister, MD;  Location: [place];  Service: Endoscopy;  Laterality: N/A;   Family History  Problem Relation Age of Onset  . Heart disease Mother     unknown type  . Heart disease Father 74    died of MI at 72yo  . Heart disease Paternal Grandfather 32    died of MI  . Heart disease    . Heart disease Brother 30   History  Substance Use Topics  . Smoking status: Current Some Day Smoker -- 0.25 packs/day for 56 years    Types: Cigarettes  . Smokeless tobacco: Never Used  . Alcohol Use: Yes     Comment: occasionally drinks, a few times a month    Review of Systems  Constitutional: Positive for fever.  HENT: Negative for congestion and sore throat.   Respiratory: Positive for shortness of breath. Negative for cough.   Cardiovascular: Positive for chest pain.  Gastrointestinal: Positive for nausea, vomiting and abdominal distention.  Musculoskeletal: Positive for arthralgias (Lower extremity pain bilaterally. ).  Neurological: Positive for weakness.  All other systems reviewed and are negative.     Allergies  Motrin and Tylenol  Home Medications   Prior to Admission medications   Medication Sig Start Date End Date Taking? Authorizing Provider  albuterol (PROVENTIL HFA;VENTOLIN HFA) 108 (90 BASE) MCG/ACT inhaler Inhale 2 puffs into the lungs every 6 (six) hours as needed for wheezing or shortness of breath. 04/30/14  Yes Thurnell Lose, MD  amLODipine (NORVASC) 10 MG tablet Take 10 mg by mouth daily.   Yes Historical Provider, MD  citalopram (CELEXA) 20 MG tablet Take 20 mg by mouth daily.   Yes Historical Provider, MD  cloNIDine (CATAPRES) 0.2 MG tablet Take 0.5 tablets (0.1 mg total) by mouth 2 (two) times daily. 02/09/14  Yes Modena Jansky, MD  divalproex (DEPAKOTE ER) 500 MG 24 hr tablet Take 500 mg by mouth 2 (two) times daily. 05/29/14  Yes Historical Provider, MD  ferrous sulfate 325 (65 FE) MG  tablet Take 325 mg by mouth 2 (two) times daily with a meal.    Yes Historical Provider, MD  furosemide (LASIX) 40 MG tablet Take 40 mg by mouth daily. 05/29/14  Yes Historical Provider, MD  gabapentin (NEURONTIN) 100 MG capsule Take 100 mg by mouth 2 (two) times daily.   Yes Historical Provider, MD  hydrALAZINE (APRESOLINE) 100 MG tablet Take 100 mg by mouth 3 (three) times daily. 05/29/14  Yes Historical Provider, MD  isosorbide mononitrate (IMDUR) 30 MG 24 hr tablet Take 30 mg by mouth daily.   Yes Historical Provider, MD  lisinopril (PRINIVIL,ZESTRIL) 20 MG tablet Take 20 mg by mouth 2 (two) times daily. 06/02/14  Yes Historical Provider, MD  nitroGLYCERIN (NITROSTAT) 0.4 MG SL tablet Place 1 tablet (  0.4 mg total) under the tongue every 5 (five) minutes as needed for chest pain (upto max 3 doses at one time.). 02/09/14  Yes Modena Jansky, MD  pantoprazole (PROTONIX) 40 MG tablet Take 40 mg by mouth daily.    Yes Historical Provider, MD  phenytoin (DILANTIN) 100 MG ER capsule Take 200-300 mg by mouth 2 (two) times daily. 200mg  in the morning, 300mg  in the evening   Yes Historical Provider, MD  pravastatin (PRAVACHOL) 40 MG tablet Take 1 tablet (40 mg total) by mouth daily at 6 PM. 02/09/14  Yes Modena Jansky, MD   Triage Vitals: BP 181/101 mmHg  Pulse 93  Temp(Src) 98.4 F (36.9 C) (Oral)  Ht 5\' 5"  (1.651 m)  Wt 194 lb 11.2 oz (88.315 kg)  BMI 32.40 kg/m2  SpO2 97%   Physical Exam  Constitutional: He is oriented to person, place, and time. He appears well-developed and well-nourished.  HENT:  Head: Normocephalic and atraumatic.  Eyes: Conjunctivae and EOM are normal.  Neck: Normal range of motion. Neck supple.  Cardiovascular: Normal rate, regular rhythm and normal heart sounds.   Pulmonary/Chest: Effort normal. No respiratory distress. He has rales in the right lower field and the left lower field.  Abdominal: He exhibits no distension. There is no tenderness. There is no rebound  and no guarding.  Musculoskeletal: Normal range of motion.       Right lower leg: He exhibits edema.       Left lower leg: He exhibits edema.  Neurological: He is alert and oriented to person, place, and time.  Skin: Skin is warm and dry.  Vitals reviewed.   ED Course  Procedures (including critical care time)  DIAGNOSTIC STUDIES: Oxygen Saturation is 97% on RA, normal by my interpretation.    COORDINATION OF CARE: 2:02 AM-Discussed treatment plan (CXR, CBC panel, CMP, UA) with pt at bedside and pt agreed to plan.   Labs Review Labs Reviewed  BRAIN NATRIURETIC PEPTIDE - Abnormal; Notable for the following:    B Natriuretic Peptide 3703.7 (*)    All other components within normal limits  COMPREHENSIVE METABOLIC PANEL - Abnormal; Notable for the following:    CO2 18 (*)    Glucose, Bld 122 (*)    BUN 25 (*)    Creatinine, Ser 1.47 (*)    Albumin 3.1 (*)    AST 43 (*)    GFR calc non Af Amer 47 (*)    GFR calc Af Amer 54 (*)    All other components within normal limits  CBC WITH DIFFERENTIAL/PLATELET - Abnormal; Notable for the following:    RBC 3.53 (*)    Hemoglobin 8.9 (*)    HCT 29.9 (*)    MCH 25.2 (*)    MCHC 29.8 (*)    RDW 17.6 (*)    All other components within normal limits  I-STAT CG4 LACTIC ACID, ED - Abnormal; Notable for the following:    Lactic Acid, Venous 2.13 (*)    All other components within normal limits  LACTIC ACID, PLASMA  LACTIC ACID, PLASMA  I-STAT TROPOININ, ED  I-STAT TROPOININ, ED    Imaging Review Dg Chest 2 View  07/03/2014   CLINICAL DATA:  Chest pain and shortness of breath  EXAM: CHEST  2 VIEW  COMPARISON:  06/06/2014  FINDINGS: Chronic cardiomegaly. There is pulmonary venous congestion with cephalized blood flow and interstitial coarsening. The patient is status post CABG. Aortic and hilar contours are stable. No  effusion or pneumonia.  IMPRESSION: Mild CHF.   Electronically Signed   By: Monte Fantasia M.D.   On: 07/03/2014 02:43      EKG Interpretation   Date/Time:  Friday July 03 2014 01:11:41 EDT Ventricular Rate:  85 PR Interval:  178 QRS Duration: 118 QT Interval:  370 QTC Calculation: 440 R Axis:   72 Text Interpretation:  Normal sinus rhythm Inferior infarct , age  undetermined Abnormal ECG No significant change since last tracing  Confirmed by Debby Freiberg [phone]) on 07/03/2014 2:00:38 AM      MDM   Final diagnoses:  Acute on chronic combined systolic and diastolic CHF (congestive heart failure)    69 y.o. male with pertinent PMH of CHF, CAD, recent GI bleed, CKD presents with chest pain, dyspnea, 20 # weight gain.  Exam as above.  Wu consistent with CHF exacerbation.  Admitted after lasix.    I have reviewed all laboratory and imaging studies if ordered as above  1. Acute on chronic combined systolic and diastolic CHF (congestive heart failure)           Debby Freiberg, MD 07/03/14 [phone]

## 2014-07-03 NOTE — Progress Notes (Addendum)
Called per floor RN at 0500 for new admit Pt complaining of left sided Chest pain. Pt received Dilaudid in ED and per ED RN report the pain was resolved. Upon arrival to floor Pt complains of 5/10 chest pain. Pt awake alert, states "Im feeling great, right now the pain is real low 5/10, nothing works except that Dilaudid." EKG completed and RRT called to review. Some minor ST changes seen. RN advised to notify provider. Tylene Fantasia [facility] NP paged and EKG reviewed, will have on call MD look at EKG as well. Dilaudid IV x 1 ordered now. Call back from NP received,please see her note regarding EKG changes. No further orders at this time. RN to monitor closely.

## 2014-07-03 NOTE — H&P (Addendum)
Hospitalist Admission History and Physical  Patient name: Charles [name] Medical record number: [id number] Date of birth: January 06, 1946 Age: 69 y.o. Gender: male  Primary Care Provider: [name]  Chief Complaint: chest pain, CHF   History of Present Illness:This is a 69 y.o. year old male with significant past medical history of polysubstance abuse, CAD s/p CABG, polysubstance, ischemic cardiomyopathy-combined systolic and diastolic CHF, stage III CKD presenting with chest pain, CHF. Pt with known hx/o recurrent CP in setting of polysubstance/cocaine abuse. Reports chest pain over past days. Pain in central chest, sharp, w/ radiation to the back. Similar to previous episodes, but worse in intensity. Denies cocaine use. Reports 20 lb weight gain over past 1 week. + orthopnea. Mild DOE.  Presented to ER T 98.4, HR 80s-90, resp 20s, BP 140s-180s/80s-110s, satting mid 90s on RA. Hgb 8.9, Cr 1.47, bicarb 18. Trop neg x1. EKG NSR-unchanged from previous. BNP 3700. CXR mild CHF.   Admission Wt: 88kg 06/09/14 D/C Wt: 78kg  Assessment and Plan: Charles [name] is a 69 y.o. year old male presenting with CHF, chest pain   Active Problems:   CHF exacerbation   CHF (congestive heart failure)   1- CHF -Admission Wt: 88kg 06/09/14 D/C Wt: 78kg  -IV lasix -strict Is and Os  -daily weight -cards consult as clinically indicated  2- Chest Pain  -mixed sxs in pt w/ baseline CAD and polysubstance abuse -no symptomatic improvement w/ NTG-........only dilaudid -similar to previous flares in the past -trop neg x 1 -EKG NSR -full dose ASA -cycle CEs -follow  3- Chronic combined systolic and diastolic CHF -2d ECHO 05/7900 EF 45-50%  And grade 2 diastolic dysfunction -treat as above -cont home regimen  4-HTN -elevated on presentation -improving w/ diuresis -titrate home regimen  -follow  5-hx/o GIB -noted hx/o GI AVM -no reported black stools  -hgb stable   -PPI -follow  6-Psych  -mood stable  7- Polysubstance Abuse -recurrent issue/nidus for above -check UDS -avoid BB pending UDS  FEN/GI: heart healthy diet  Prophylaxis: sub q heparin  Disposition: pending further evaluation Code Status:Full Code   Patient Active Problem List   Diagnosis Date Noted  . CHF (congestive heart failure) 07/03/2014  . Symptomatic anemia 06/06/2014  . Hyponatremia 05/20/2014  . Acute on chronic combined systolic and diastolic CHF (congestive heart failure) 05/20/2014  . CHF exacerbation   . Acute diastolic CHF (congestive heart failure) 04/26/2014  . Cocaine abuse   . Acute exacerbation of congestive heart failure 01/01/2014  . Chest pain 12/07/2013  . Atypical angina 12/07/2013  . PAT- 10bts,during Myoview 11/27/13 12/01/2013  . ICM- new drop in EF-40-45%- 2D 11/27/13 11/29/2013  . History of GI bleed 11/26/2013  . Pleuritic chest pain 10/23/2013  . H/O medication noncompliance 10/23/2013  . 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 Acute on chronic combined systolic and diastolic heart failure 40/97/3532  . Leg pain, bilateral 07/16/2013  . Convulsions/seizures 03/09/2013  . Bradycardia 08/14/2012  . Unstable angina 06/14/2012  . High risk sexual behavior 06/14/2012  . Iron deficiency anemia   . Hypertension   . CAD- CABG x3 '09. Myoview no ischemia 11/27/13   . Chronic kidney disease (CKD), stage III (moderate)   . Homelessness   . Polysubstance abuse   . Family history of early CAD   . DDD (degenerative disc disease), lumbar   . Renal artery stenosis   . Peripheral vascular disease   . Seizure disorder   . GI bleed 03/20/2010  .  Moderate to severe pulmonary hypertension 03/20/2010   Past Medical History: Past Medical History  Diagnosis Date  . Iron deficiency anemia   . Chronic combined systolic and diastolic CHF, NYHA class 2     a. 11/2013 Echo: EF 40-45%, basl-mid inflat AK, Gr 2 DD.  [name] Hyperlipidemia LDL goal <70   . Obstructive sleep apnea      a. not on home CPAP  . Ischemic cardiomyopathy     a. 11/2013 Echo: EF 40-45%, basl-mid inflat AK, Gr 2 DD, mild AI, mod dil LA/RA, mod reduced RV fxn, PASP 36mmHg.  [name] Homelessness   . Moderate to severe pulmonary hypertension 03/2010    a. PA peak pressure of 76 mmHg (per 2D Echo 03/2010)  . Polysubstance abuse     a. cocaine, THC  . AV malformation of gastrointestinal tract     a. w/ h/o GIB.  [name] Family history of early CAD   . DDD (degenerative disc disease), lumbar   . Peripheral vascular disease   . Seizure disorder   . Hypertension   . CAD (coronary artery disease)     a. s/p 3-vessel CABG (12/2007) // 100% RCA stenosis with collaterals from left system. Severe bifurcation lesions of proxima CXA and OM. Moderate LAD disease - followed by Dr. Wyline Copas in [facility];  b. 11/2013 Myoview: Large fixed inf defect w/o ischemia, EF 35%.  . Diabetes   . Chronic kidney disease (CKD), stage III (moderate)     a. BL SCr 1.5-1.6  . Renal artery stenosis   . Seizures     Past Surgical History: Past Surgical History  Procedure Laterality Date  . Apc  03/2010    To treat small bowel AVMs  . Esophagogastroduodenoscopy N/A 08/14/2012    Procedure: ESOPHAGOGASTRODUODENOSCOPY (EGD);  Surgeon: [name], MD;  Location: [facility] ENDOSCOPY;  Service: Endoscopy;  Laterality: N/A;  . Hot hemostasis N/A 08/14/2012    Procedure: HOT HEMOSTASIS (ARGON PLASMA COAGULATION/BICAP);  Surgeon: [name], MD;  Location: [facility] ENDOSCOPY;  Service: Endoscopy;  Laterality: N/A;  . Esophagogastroduodenoscopy (egd) with propofol N/A 08/04/2013    Procedure: ESOPHAGOGASTRODUODENOSCOPY (EGD) WITH PROPOFOL;  Surgeon: [name], MD;  Location: [facility] ENDOSCOPY;  Service: Endoscopy;  Laterality: N/A;  . Coronary artery bypass graft  12/2007  . Esophagogastroduodenoscopy (egd) with propofol N/A 06/08/2014    Procedure: ESOPHAGOGASTRODUODENOSCOPY (EGD) WITH PROPOFOL;  Surgeon: Milus Banister, MD;  Location: [place];   Service: Endoscopy;  Laterality: N/A;    Social History: Unchanged from previous   Family History: Family History  Problem Relation Age of Onset  . Heart disease Mother     unknown type  . Heart disease Father 20    died of MI at 97yo  . Heart disease Paternal Grandfather 30    died of MI  . Heart disease    . Heart disease Brother 30    Allergies: Allergies  Allergen Reactions  . Motrin [Ibuprofen] Other (See Comments)    Affects kidneys  . Tylenol [Acetaminophen] Other (See Comments)    Affects kidneys    Current Facility-Administered Medications  Medication Dose Route Frequency Provider Last Rate Last Dose  . 0.9 %  sodium chloride infusion  250 mL Intravenous PRN Deneise Lever, MD      . albuterol (PROVENTIL HFA;VENTOLIN HFA) 108 (90 BASE) MCG/ACT inhaler 2 puff  2 puff Inhalation Q6H PRN Deneise Lever, MD      . amLODipine (NORVASC) tablet 10 mg  10  mg Oral Daily Deneise Lever, MD      . aspirin EC tablet 325 mg  325 mg Oral Daily Deneise Lever, MD      . citalopram (CELEXA) tablet 20 mg  20 mg Oral Daily Deneise Lever, MD      . divalproex (DEPAKOTE ER) 24 hr tablet 500 mg  500 mg Oral BID Deneise Lever, MD      . furosemide (LASIX) injection 60 mg  60 mg Intravenous Q6H Deneise Lever, MD      . gabapentin (NEURONTIN) capsule 100 mg  100 mg Oral BID Deneise Lever, MD      . heparin injection 5,000 Units  5,000 Units Subcutaneous 3 times per day Deneise Lever, MD      . isosorbide mononitrate (IMDUR) 24 hr tablet 30 mg  30 mg Oral Daily Deneise Lever, MD      . lisinopril (PRINIVIL,ZESTRIL) tablet 20 mg  20 mg Oral BID Deneise Lever, MD      . nitroGLYCERIN (NITROSTAT) SL tablet 0.4 mg  0.4 mg Sublingual Q5 min PRN Debby Freiberg, MD   0.4 mg at 07/03/14 0244  . nitroGLYCERIN (NITROSTAT) SL tablet 0.4 mg  0.4 mg Sublingual Q5 min PRN Deneise Lever, MD      . pantoprazole (PROTONIX) EC tablet 40 mg  40 mg Oral Daily Deneise Lever, MD      .  phenytoin (DILANTIN) ER capsule 200-300 mg  200-300 mg Oral BID Deneise Lever, MD      . pravastatin (PRAVACHOL) tablet 40 mg  40 mg Oral q1800 Deneise Lever, MD      . sodium chloride 0.9 % injection 3 mL  3 mL Intravenous Q12H Deneise Lever, MD      . sodium chloride 0.9 % injection 3 mL  3 mL Intravenous Q12H Deneise Lever, MD      . sodium chloride 0.9 % injection 3 mL  3 mL Intravenous PRN Deneise Lever, MD       Current Outpatient Prescriptions  Medication Sig Dispense Refill  . albuterol (PROVENTIL HFA;VENTOLIN HFA) 108 (90 BASE) MCG/ACT inhaler Inhale 2 puffs into the lungs every 6 (six) hours as needed for wheezing or shortness of breath. 1 Inhaler 2  . amLODipine (NORVASC) 10 MG tablet Take 10 mg by mouth daily.    . citalopram (CELEXA) 20 MG tablet Take 20 mg by mouth daily.    . cloNIDine (CATAPRES) 0.2 MG tablet Take 0.5 tablets (0.1 mg total) by mouth 2 (two) times daily. 30 tablet 0  . divalproex (DEPAKOTE ER) 500 MG 24 hr tablet Take 500 mg by mouth 2 (two) times daily.    . ferrous sulfate 325 (65 FE) MG tablet Take 325 mg by mouth 2 (two) times daily with a meal.     . furosemide (LASIX) 40 MG tablet Take 40 mg by mouth daily.    [name] gabapentin (NEURONTIN) 100 MG capsule Take 100 mg by mouth 2 (two) times daily.    . hydrALAZINE (APRESOLINE) 100 MG tablet Take 100 mg by mouth 3 (three) times daily.    . isosorbide mononitrate (IMDUR) 30 MG 24 hr tablet Take 30 mg by mouth daily.    [name] lisinopril (PRINIVIL,ZESTRIL) 20 MG tablet Take 20 mg by mouth 2 (two) times daily.    . nitroGLYCERIN (NITROSTAT) 0.4 MG SL tablet Place 1 tablet (0.4 mg total) under the tongue every  5 (five) minutes as needed for chest pain (upto max 3 doses at one time.). 30 tablet 0  . pantoprazole (PROTONIX) 40 MG tablet Take 40 mg by mouth daily.     . phenytoin (DILANTIN) 100 MG ER capsule Take 200-300 mg by mouth 2 (two) times daily. 200mg  in the morning, 300mg  in the evening    . pravastatin  (PRAVACHOL) 40 MG tablet Take 1 tablet (40 mg total) by mouth daily at 6 PM. 30 tablet 0   Review Of Systems: 12 point ROS negative except as noted above in HPI.  Physical Exam: Filed Vitals:   07/03/14 0415  BP: 143/89  Pulse: 86  Temp:   Resp: 22    General: alert and cooperative HEENT: PERRLA and extra ocular movement intact Heart: S1, S2 normal, no murmur, rub or gallop, regular rate and rhythm Lungs: clear to auscultation, no wheezes or rales and unlabored breathing Abdomen: abdomen is soft without significant tenderness, masses, organomegaly or guarding Extremities: trace edema bilaterally  Skin:no ecchymoses, no rashes Neurology: normal without focal findings  Labs and Imaging: Lab Results  Component Value Date/Time   NA 138 07/03/2014 02:10 AM   K 4.4 07/03/2014 02:10 AM   CL 107 07/03/2014 02:10 AM   CO2 18* 07/03/2014 02:10 AM   BUN 25* 07/03/2014 02:10 AM   CREATININE 1.47* 07/03/2014 02:10 AM   CREATININE 1.32 05/11/2010 11:00 PM   GLUCOSE 122* 07/03/2014 02:10 AM   Lab Results  Component Value Date   WBC 5.8 07/03/2014   HGB 8.9* 07/03/2014   HCT 29.9* 07/03/2014   MCV 84.7 07/03/2014   PLT 308 07/03/2014    Dg Chest 2 View  07/03/2014   CLINICAL DATA:  Chest pain and shortness of breath  EXAM: CHEST  2 VIEW  COMPARISON:  06/06/2014  FINDINGS: Chronic cardiomegaly. There is pulmonary venous congestion with cephalized blood flow and interstitial coarsening. The patient is status post CABG. Aortic and hilar contours are stable. No effusion or pneumonia.  IMPRESSION: Mild CHF.   Electronically Signed   By: Monte Fantasia M.D.   On: 07/03/2014 02:43           Shanda Howells MD  Pager: [phone]

## 2014-07-03 NOTE — Progress Notes (Signed)
Pt arrived to floor, c/o chest pain 5/10 sharp in left side of chest. Pt states nitro did not help with pain in ED, only dilaudid. EKG performed. Rapid Response RN and Tylene Fantasia paged to review EKG. BP 160/108. Pt in NAD. No nausea or sweating. Pt given 1mg  Dilaudid per order. Will continue to monitor. Ronnette Hila, RN

## 2014-07-03 NOTE — Progress Notes (Signed)
Pt admitted earlier with fluid overload/CHF and was c/o CP on admission. POC troponin .03 in ED. First troponin on floor is due around 8am. Pt states CP was 10/10 in ED unrelieved by NTG, but now 5/10 after Dilaudid in ED. He has had 325mg  ASA. No beta blockers due to pt's documented hx of Cocaine use/abuse and UDS is still pending. However, upon review of chart, UDS has been positive multiple times in the past.  RN paged me after 12 lead obtained on floor because the read out said "acute MI". EKG reviewed by NP and BBB noted and didn't think this was an MI. Called [facility] admitting doc who agreed but asked this NP to run it by cardio. This NP called Dr. Aundra Dubin, cardio fellow, who reviewed EKGs. Called this "non specific changes" that are not significant for a change in tx plan at this time. Will continue to cycle cardiac enzymes. Avoid BB. Clance Boll, NP [facility]

## 2014-07-03 NOTE — ED Notes (Signed)
Pt c/o L sided cp, sob, abdominal bloating that started a few hours ago. Pt also c/o bilateral leg pain- sts his legs have been giving out on him causing him to fall 4 timestoday

## 2014-07-03 NOTE — Care Management Note (Unsigned)
    Page 1 of 1   07/03/2014     4:05:53 PM CARE MANAGEMENT NOTE 07/03/2014  Patient:  Charles [name], Charles [name]   Account Number:  [id number]  Date Initiated:  07/03/2014  Documentation initiated by:  Emsley Custer  Subjective/Objective Assessment:   Pt adm on 07/03/14 with CP, SOB, leg pain.  PTA, pt independent, lives with roommate.     Action/Plan:   Will follow for dc needs as pt progresses.   Anticipated DC Date:  07/05/2014   Anticipated DC Plan:  [place]  CM consult      Choice offered to / List presented to:             Status of service:  In process, will continue to follow Medicare Important Message given?   (If response is "NO", the following Medicare IM given date fields will be blank) Date Medicare IM given:   Medicare IM given by:   Date Additional Medicare IM given:   Additional Medicare IM given by:    Discharge Disposition:    Per UR Regulation:  Reviewed for med. necessity/level of care/duration of stay  If discussed at [place] of Stay Meetings, dates discussed:    Comments:

## 2014-07-04 DIAGNOSIS — G40909 Epilepsy, unspecified, not intractable, without status epilepticus: Secondary | ICD-10-CM

## 2014-07-04 DIAGNOSIS — F1910 Other psychoactive substance abuse, uncomplicated: Secondary | ICD-10-CM

## 2014-07-04 DIAGNOSIS — N183 Chronic kidney disease, stage 3 (moderate): Secondary | ICD-10-CM

## 2014-07-04 DIAGNOSIS — I10 Essential (primary) hypertension: Secondary | ICD-10-CM

## 2014-07-04 LAB — CBC WITH DIFFERENTIAL/PLATELET
Basophils Absolute: 0.1 10*3/uL (ref 0.0–0.1)
Basophils Relative: 1 % (ref 0–1)
EOS PCT: 4 % (ref 0–5)
Eosinophils Absolute: 0.3 10*3/uL (ref 0.0–0.7)
HEMATOCRIT: 27.9 % — AB (ref 39.0–52.0)
Hemoglobin: 8.4 g/dL — ABNORMAL LOW (ref 13.0–17.0)
Lymphocytes Relative: 13 % (ref 12–46)
Lymphs Abs: 0.9 10*3/uL (ref 0.7–4.0)
MCH: 25.2 pg — AB (ref 26.0–34.0)
MCHC: 30.1 g/dL (ref 30.0–36.0)
MCV: 83.8 fL (ref 78.0–100.0)
Monocytes Absolute: 1.1 10*3/uL — ABNORMAL HIGH (ref 0.1–1.0)
Monocytes Relative: 17 % — ABNORMAL HIGH (ref 3–12)
NEUTROS ABS: 4.4 10*3/uL (ref 1.7–7.7)
Neutrophils Relative %: 65 % (ref 43–77)
PLATELETS: 289 10*3/uL (ref 150–400)
RBC: 3.33 MIL/uL — ABNORMAL LOW (ref 4.22–5.81)
RDW: 17.5 % — ABNORMAL HIGH (ref 11.5–15.5)
WBC: 6.7 10*3/uL (ref 4.0–10.5)

## 2014-07-04 LAB — COMPREHENSIVE METABOLIC PANEL
ALT: 20 U/L (ref 0–53)
ANION GAP: 9 (ref 5–15)
AST: 28 U/L (ref 0–37)
Albumin: 2.8 g/dL — ABNORMAL LOW (ref 3.5–5.2)
Alkaline Phosphatase: 94 U/L (ref 39–117)
BUN: 27 mg/dL — AB (ref 6–23)
CHLORIDE: 106 mmol/L (ref 96–112)
CO2: 23 mmol/L (ref 19–32)
CREATININE: 1.34 mg/dL (ref 0.50–1.35)
Calcium: 8.6 mg/dL (ref 8.4–10.5)
GFR calc non Af Amer: 52 mL/min — ABNORMAL LOW (ref >90)
GFR, EST AFRICAN AMERICAN: 60 mL/min — AB (ref >90)
Glucose, Bld: 93 mg/dL (ref 70–99)
POTASSIUM: 4.1 mmol/L (ref 3.5–5.1)
Sodium: 138 mmol/L (ref 135–145)
Total Bilirubin: 0.9 mg/dL (ref 0.3–1.2)
Total Protein: 6.9 g/dL (ref 6.0–8.3)

## 2014-07-04 MED ORDER — BISACODYL 10 MG RE SUPP
10.0000 mg | Freq: Once | RECTAL | Status: AC
  Administered 2014-07-04: 10 mg via RECTAL
  Filled 2014-07-04: qty 1

## 2014-07-04 MED ORDER — LINACLOTIDE 145 MCG PO CAPS
145.0000 ug | ORAL_CAPSULE | Freq: Every day | ORAL | Status: DC
  Administered 2014-07-04 – 2014-07-05 (×2): 145 ug via ORAL
  Filled 2014-07-04 (×2): qty 1

## 2014-07-04 MED ORDER — PEG-KCL-NACL-NASULF-NA ASC-C 100 G PO SOLR
1.0000 | Freq: Once | ORAL | Status: AC
  Administered 2014-07-04: 200 g via ORAL
  Filled 2014-07-04: qty 1

## 2014-07-04 NOTE — Progress Notes (Signed)
Pt has good results from Moviprep.

## 2014-07-04 NOTE — Progress Notes (Signed)
[facility] PROGRESS NOTE  Charles [name] YYQ:[id number] DOB: [birth year] DOA: 07/03/2014 PCP: [name]  Assessment/Plan: 1. 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 Recent 2d echo with EF 45-50% with grade 2 diastolic dysfunction 2. Today's weight 80.4kg 3. Dry wt 78kg 2. Generalized Pain 1. Patient continues to complain of generalized pains. Of note, L sided chest pain is very much reproducible on exam. 2. Started a trial of PRN cyclobenzaprine  3. CAD s/p CABG 1. Stable 2. Unremarkable cardiac enzymes thus far 4. Hx seizures 1. No recurrent seizure activity 2. Cont seizure meds 5. HTN 1. BP stable, albeit suboptimally controlled 2. Pt does not seem comfortable currently. If BP remains elevated, would then titrate meds 6. Hx GI bleed 1. hgb stable 2. No signs of active bleed 3. Monitor CBC 7. Polysubstance abuse 1. Pt admits to continued cocaine abuse, last use claimed to be last week 2. UDS pos for cocaine 3. Cessation, once again, done at bedside 8. CKD3 1. Cr seems stable 2. Cont to monitor renal function 9. DVT prophylaxis 1. Heparin subQ 10. Constipation 1. Abd distended 2. Xray abd with large amount of stool per my own read 3. No significant stool output despite sorbitol with dulcolax 4. Have started linzess with Movi bowel prep and dulcolax suppository  Code Status: Full Family Communication: Pt in room Disposition Plan: Pending   Consultants:    Procedures:    Antibiotics:    HPI/Subjective: Reports only small BM this AM, still feels constipated  Objective: Filed Vitals:   07/03/14 0506 07/03/14 1343 07/03/14 2040 07/04/14 0434  BP: 160/108 153/99 168/89 163/90  Pulse:  69 66 69  Temp:  98.4 F (36.9 C) 97.9 F (36.6 C) 98.9 F (37.2 C)  TempSrc:  Oral Oral Oral  Resp:  20 18 18   Height:      Weight:    80.423 kg (177 lb 4.8 oz)  SpO2:  96% 98% 98%    Intake/Output Summary (Last 24  hours) at 07/04/14 1427 Last data filed at 07/04/14 1400  Gross per 24 hour  Intake   1200 ml  Output   2802 ml  Net  -1602 ml   Filed Weights   07/03/14 0110 07/03/14 0502 07/04/14 0434  Weight: 88.315 kg (194 lb 11.2 oz) 81.1 kg (178 lb 12.7 oz) 80.423 kg (177 lb 4.8 oz)    Exam:   General:  Awake, in nad, sitting eating lunch  Cardiovascular: regular, s1, s2  Respiratory: normal resp effort, no wheezing  Abdomen: decreased BS, distended, nontender  Musculoskeletal: perfused, no clubbing   Data Reviewed: Basic Metabolic Panel:  Recent Labs Lab 07/03/14 0210 07/04/14 0357  NA 138 138  K 4.4 4.1  CL 107 106  CO2 18* 23  GLUCOSE 122* 93  BUN 25* 27*  CREATININE 1.47* 1.34  CALCIUM 9.0 8.6   Liver Function Tests:  Recent Labs Lab 07/03/14 0210 07/04/14 0357  AST 43* 28  ALT 26 20  ALKPHOS 88 94  BILITOT 1.1 0.9  PROT 7.5 6.9  ALBUMIN 3.1* 2.8*   No results for input(s): LIPASE, AMYLASE in the last 168 hours. No results for input(s): AMMONIA in the last 168 hours. CBC:  Recent Labs Lab 07/03/14 0210 07/04/14 0357  WBC 5.8 6.7  NEUTROABS 4.4 4.4  HGB 8.9* 8.4*  HCT 29.9* 27.9*  MCV 84.7 83.8  PLT 308 289   Cardiac Enzymes:  Recent Labs Lab 07/03/14 1116 07/03/14 1625  TROPONINI  0.05* 0.06*   BNP (last 3 results)  Recent Labs  05/20/14 0947 06/06/14 0835 07/03/14 0210  BNP 2249.6* 1732.2* 3703.7*    ProBNP (last 3 results)  Recent Labs  01/01/14 1107 02/07/14 0123 03/01/14 2250  PROBNP 3225.0* 2648.0* 7421.0*    CBG: No results for input(s): GLUCAP in the last 168 hours.  No results found for this or any previous visit (from the past 240 hour(s)).   Studies: Dg Chest 2 View  07/03/2014   CLINICAL DATA:  Chest pain and shortness of breath  EXAM: CHEST  2 VIEW  COMPARISON:  06/06/2014  FINDINGS: Chronic cardiomegaly. There is pulmonary venous congestion with cephalized blood flow and interstitial coarsening. The patient  is status post CABG. Aortic and hilar contours are stable. No effusion or pneumonia.  IMPRESSION: Mild CHF.   Electronically Signed   By: Monte Fantasia M.D.   On: 07/03/2014 02:43   Dg Abd Portable 1v  07/03/2014   CLINICAL DATA:  Constipation  EXAM: PORTABLE ABDOMEN - 1 VIEW  COMPARISON:  CT abdomen and pelvis April 26, 2014  FINDINGS: There is moderate stool throughout the colon. There is no bowel dilatation or air-fluid level suggesting obstruction. No free air. There is vascular calcification in the aorta and major pelvic arterial vessels.  IMPRESSION: Moderate stool in colon. Bowel gas pattern unremarkable. Extensive atherosclerotic calcification.   Electronically Signed   By: Lowella Grip III M.D.   On: 07/03/2014 16:08    Scheduled Meds: . amLODipine  10 mg Oral Daily  . aspirin EC  325 mg Oral Daily  . citalopram  20 mg Oral Daily  . divalproex  500 mg Oral BID  . furosemide  60 mg Intravenous Q12H  . gabapentin  100 mg Oral BID  . heparin  5,000 Units Subcutaneous 3 times per day  . isosorbide mononitrate  30 mg Oral Daily  . Linaclotide  145 mcg Oral Daily  . lisinopril  20 mg Oral BID  . pantoprazole  40 mg Oral Daily  . peg 3350 powder  1 kit Oral Once  . phenytoin  200 mg Oral Daily   And  . phenytoin  300 mg Oral QHS  . pravastatin  40 mg Oral q1800  . sodium chloride  3 mL Intravenous Q12H  . sodium chloride  3 mL Intravenous Q12H   Continuous Infusions:   Principal Problem:   Acute on chronic combined systolic and diastolic CHF (congestive heart failure) Active Problems:   Hypertension   CAD- CABG x3 '09. Myoview no ischemia 11/27/13   Chronic kidney disease (CKD), stage III (moderate)   Polysubstance abuse   Seizure disorder   History of GI bleed   CHF exacerbation   CHF (congestive heart failure)   Alex Leahy K  [facility] Pager [phone]. If 7PM-7AM, please contact night-coverage at [email or web address], password [facility] 07/04/2014, 2:27 PM  LOS:  1 day

## 2014-07-05 DIAGNOSIS — F1410 Cocaine abuse, uncomplicated: Secondary | ICD-10-CM

## 2014-07-05 DIAGNOSIS — I509 Heart failure, unspecified: Secondary | ICD-10-CM

## 2014-07-05 DIAGNOSIS — Z8719 Personal history of other diseases of the digestive system: Secondary | ICD-10-CM

## 2014-07-05 DIAGNOSIS — K5900 Constipation, unspecified: Secondary | ICD-10-CM

## 2014-07-05 LAB — COMPREHENSIVE METABOLIC PANEL
ALK PHOS: 92 U/L (ref 39–117)
ALT: 16 U/L (ref 0–53)
AST: 29 U/L (ref 0–37)
Albumin: 2.7 g/dL — ABNORMAL LOW (ref 3.5–5.2)
Anion gap: 13 (ref 5–15)
BUN: 24 mg/dL — ABNORMAL HIGH (ref 6–23)
CALCIUM: 8.4 mg/dL (ref 8.4–10.5)
CO2: 24 mmol/L (ref 19–32)
Chloride: 101 mmol/L (ref 96–112)
Creatinine, Ser: 1.15 mg/dL (ref 0.50–1.35)
GFR calc Af Amer: 73 mL/min — ABNORMAL LOW (ref >90)
GFR calc non Af Amer: 63 mL/min — ABNORMAL LOW (ref >90)
Glucose, Bld: 88 mg/dL (ref 70–99)
Potassium: 3.9 mmol/L (ref 3.5–5.1)
Sodium: 138 mmol/L (ref 135–145)
TOTAL PROTEIN: 7.4 g/dL (ref 6.0–8.3)
Total Bilirubin: 0.7 mg/dL (ref 0.3–1.2)

## 2014-07-05 LAB — CBC WITH DIFFERENTIAL/PLATELET
BASOS ABS: 0 10*3/uL (ref 0.0–0.1)
Basophils Relative: 1 % (ref 0–1)
Eosinophils Absolute: 0.3 10*3/uL (ref 0.0–0.7)
Eosinophils Relative: 6 % — ABNORMAL HIGH (ref 0–5)
HCT: 28.6 % — ABNORMAL LOW (ref 39.0–52.0)
Hemoglobin: 8.5 g/dL — ABNORMAL LOW (ref 13.0–17.0)
Lymphocytes Relative: 15 % (ref 12–46)
Lymphs Abs: 0.9 10*3/uL (ref 0.7–4.0)
MCH: 24.8 pg — ABNORMAL LOW (ref 26.0–34.0)
MCHC: 29.7 g/dL — AB (ref 30.0–36.0)
MCV: 83.4 fL (ref 78.0–100.0)
Monocytes Absolute: 0.6 10*3/uL (ref 0.1–1.0)
Monocytes Relative: 11 % (ref 3–12)
NEUTROS ABS: 3.7 10*3/uL (ref 1.7–7.7)
Neutrophils Relative %: 67 % (ref 43–77)
Platelets: 330 10*3/uL (ref 150–400)
RBC: 3.43 MIL/uL — AB (ref 4.22–5.81)
RDW: 17.2 % — ABNORMAL HIGH (ref 11.5–15.5)
WBC: 5.6 10*3/uL (ref 4.0–10.5)

## 2014-07-05 MED ORDER — CYCLOBENZAPRINE HCL 5 MG PO TABS: 5.0000 mg | ORAL_TABLET | Freq: Three times a day (TID) | ORAL | Status: DC | PRN

## 2014-07-05 MED ORDER — FUROSEMIDE 40 MG PO TABS
40.0000 mg | ORAL_TABLET | Freq: Every day | ORAL | Status: DC
  Administered 2014-07-05: 40 mg via ORAL
  Filled 2014-07-05: qty 1

## 2014-07-05 NOTE — Discharge Summary (Addendum)
Physician Discharge Summary  Charles [name] HAL:[id number] DOB: 1945/12/07 DOA: 07/03/2014  PCP: [name]  Admit date: 07/03/2014 Discharge date: 07/05/2014  Time spent: 20 minutes  Recommendations for Outpatient Follow-up:  1. Follow up with PCP in 1-2 weeks  Discharge Diagnoses:  Principal Problem:   Acute on chronic combined systolic and diastolic CHF (congestive heart failure) Active Problems:   Hypertension   CAD- CABG x3 '09. Myoview no ischemia 11/27/13   Chronic kidney disease (CKD), stage III (moderate)   Polysubstance abuse   Seizure disorder   History of GI bleed   Cocaine abuse   CHF exacerbation   CHF (congestive heart failure)   Constipation   Discharge Condition: Improved  Diet recommendation: heart healthy  Filed Weights   07/03/14 0502 07/04/14 0434 07/05/14 0600  Weight: 81.1 kg (178 lb 12.7 oz) 80.423 kg (177 lb 4.8 oz) 76.114 kg (167 lb 12.8 oz)    History of present illness:  Please see admit h and p from 4/15 for details. Briefly, pt presents with chest pains in the setting of active cocaine abuse and a 20lb wt gain over the past week prior to admission.  Hospital Course:  1. 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 Recent 2d echo with EF 45-50% with grade 2 diastolic dysfunction 2. Today's weight 76kg 3. Dry wt on recent discharge 78kg 4. Suspect gross medical and dietary noncompliance prior to admission 2. Generalized Pain 1. Patient continues to complain of generalized pains. Of note, L sided chest pain is very much reproducible on exam. 2. Case was discussed with Cardiology early during this course with recommendations for continued medical management 3. Started a trial of PRN cyclobenzaprine for musculoskeletal pain 3. CAD s/p CABG 1. Stable 2. Unremarkable cardiac enzymes thus far 4. Hx seizures 1. No recurrent seizure activity 2. Cont seizure meds 5. HTN 1. BP stable, albeit suboptimally  controlled 2. Titrate as outpatient 6. Hx GI bleed 1. hgb stable 2. No signs of active bleed 3. Monitor CBC 7. Polysubstance abuse 1. Pt admits to continued cocaine abuse, last use claimed to be last week 2. UDS pos for cocaine 3. Cessation, once again, done at bedside 8. CKD3 1. Cr seems stable 2. Cont to monitor renal function 9. DVT prophylaxis 1. Heparin subQ 10. Constipation 1. Abd distended 2. Xray abd with large amount of stool per my own read 3. Good results with Movi bowel prep with sorbitol and dulcolax suppository 4. Cont with bowel regimen on discharge  Consultations:  none  Discharge Exam: Filed Vitals:   07/04/14 1531 07/04/14 2035 07/05/14 0600 07/05/14 0933  BP: 151/77 144/81 162/73 173/89  Pulse: 71 68 71   Temp: 97.8 F (36.6 C) 97.5 F (36.4 C) 97.8 F (36.6 C)   TempSrc: Oral Axillary Oral   Resp:  18 18   Height:      Weight:   76.114 kg (167 lb 12.8 oz)   SpO2: 95% 92% 100%     General: awake, in nad Cardiovascular: regular, s1, s2 Respiratory: normal resp effort, no wheezing  Discharge Instructions     Medication List    TAKE these medications        albuterol 108 (90 BASE) MCG/ACT inhaler  Commonly known as:  PROVENTIL HFA;VENTOLIN HFA  Inhale 2 puffs into the lungs every 6 (six) hours as needed for wheezing or shortness of breath.     amLODipine 10 MG tablet  Commonly known as:  NORVASC  Take  10 mg by mouth daily.     citalopram 20 MG tablet  Commonly known as:  CELEXA  Take 20 mg by mouth daily.     cloNIDine 0.2 MG tablet  Commonly known as:  CATAPRES  Take 0.5 tablets (0.1 mg total) by mouth 2 (two) times daily.     cyclobenzaprine 5 MG tablet  Commonly known as:  FLEXERIL  Take 1 tablet (5 mg total) by mouth 3 (three) times daily as needed for muscle spasms (chest and leg pains).     divalproex 500 MG 24 hr tablet  Commonly known as:  DEPAKOTE ER  Take 500 mg by mouth 2 (two) times daily.     ferrous sulfate 325  (65 FE) MG tablet  Take 325 mg by mouth 2 (two) times daily with a meal.     furosemide 40 MG tablet  Commonly known as:  LASIX  Take 40 mg by mouth daily.     gabapentin 100 MG capsule  Commonly known as:  NEURONTIN  Take 100 mg by mouth 2 (two) times daily.     hydrALAZINE 100 MG tablet  Commonly known as:  APRESOLINE  Take 100 mg by mouth 3 (three) times daily.     isosorbide mononitrate 30 MG 24 hr tablet  Commonly known as:  IMDUR  Take 30 mg by mouth daily.     lisinopril 20 MG tablet  Commonly known as:  PRINIVIL,ZESTRIL  Take 20 mg by mouth 2 (two) times daily.     nitroGLYCERIN 0.4 MG SL tablet  Commonly known as:  NITROSTAT  Place 1 tablet (0.4 mg total) under the tongue every 5 (five) minutes as needed for chest pain (upto max 3 doses at one time.).     pantoprazole 40 MG tablet  Commonly known as:  PROTONIX  Take 40 mg by mouth daily.     phenytoin 100 MG ER capsule  Commonly known as:  DILANTIN  Take 200-300 mg by mouth 2 (two) times daily. 200mg  in the morning, 300mg  in the evening     pravastatin 40 MG tablet  Commonly known as:  PRAVACHOL  Take 1 tablet (40 mg total) by mouth daily at 6 PM.       Allergies  Allergen Reactions  . Motrin [Ibuprofen] Other (See Comments)    Affects kidneys  . Tylenol [Acetaminophen] Other (See Comments)    Affects kidneys   Follow-up Information    Follow up with [name]. Schedule an appointment as soon as possible for a visit in 1 week.   Specialty:  Family Medicine   Why:  Hospital follow up   Contact information:   [address] [address] [phone]        The results of significant diagnostics from this hospitalization (including imaging, microbiology, ancillary and laboratory) are listed below for reference.    Significant Diagnostic Studies: Dg Chest 2 View  07/03/2014   CLINICAL DATA:  Chest pain and shortness of breath  EXAM: CHEST  2 VIEW  COMPARISON:  06/06/2014   FINDINGS: Chronic cardiomegaly. There is pulmonary venous congestion with cephalized blood flow and interstitial coarsening. The patient is status post CABG. Aortic and hilar contours are stable. No effusion or pneumonia.  IMPRESSION: Mild CHF.   Electronically Signed   By: Monte Fantasia M.D.   On: 07/03/2014 02:43   Dg Chest 2 View  06/06/2014   CLINICAL DATA:  Patient fell at home twice this morning, history  of hypertension coronary artery disease diabetes  EXAM: CHEST  2 VIEW  COMPARISON:  05/20/2014  FINDINGS: Severe cardiac enlargement stable. Status post prior CABG. Moderate vascular congestion. Mild interstitial prominence. No consolidation or effusion. Osseous thorax intact.  IMPRESSION: Similar to prior study there is evidence of congestive heart failure.   Electronically Signed   By: Skipper Cliche M.D.   On: 06/06/2014 08:44   Dg Lumbar Spine Complete  06/06/2014   CLINICAL DATA:  Recent falls with low back pain and radiculopathy  EXAM: LUMBAR SPINE - COMPLETE 4+ VIEW  COMPARISON:  None.  FINDINGS: Five lumbar type vertebral bodies are well visualized. Vertebral body height is well maintained. No spondylolysis or spondylolisthesis is seen. Osteophytic changes are noted with disc space narrowing at L4-5 and to a lesser degree at L3-4. Diffuse aortic calcifications are noted without aneurysmal dilatation.  IMPRESSION: Degenerative change without acute abnormality.   Electronically Signed   By: Inez Catalina M.D.   On: 06/06/2014 08:45   Ct Cervical Spine Wo Contrast  06/06/2014   CLINICAL DATA:  Recent fall with neck pain, initial encounter  EXAM: CT CERVICAL SPINE WITHOUT CONTRAST  TECHNIQUE: Multidetector CT imaging of the cervical spine was performed without intravenous contrast. Multiplanar CT image reconstructions were also generated.  COMPARISON:  None.  FINDINGS: Seven cervical segments are well visualized. Mild osteophytic changes are noted particularly at C5-6 and C6-7. Facet  hypertrophic changes are noted. No findings to suggest acute fracture or acute facet abnormality are noted. Heavy carotid calcifications are noted bilaterally.  IMPRESSION: Multilevel degenerative change without acute abnormality.   Electronically Signed   By: Inez Catalina M.D.   On: 06/06/2014 09:00   Dg Abd Portable 1v  07/03/2014   CLINICAL DATA:  Constipation  EXAM: PORTABLE ABDOMEN - 1 VIEW  COMPARISON:  CT abdomen and pelvis April 26, 2014  FINDINGS: There is moderate stool throughout the colon. There is no bowel dilatation or air-fluid level suggesting obstruction. No free air. There is vascular calcification in the aorta and major pelvic arterial vessels.  IMPRESSION: Moderate stool in colon. Bowel gas pattern unremarkable. Extensive atherosclerotic calcification.   Electronically Signed   By: Lowella Grip III M.D.   On: 07/03/2014 16:08    Microbiology: No results found for this or any previous visit (from the past 240 hour(s)).   Labs: Basic Metabolic Panel:  Recent Labs Lab 07/03/14 0210 07/04/14 0357 07/05/14 0326  NA 138 138 138  K 4.4 4.1 3.9  CL 107 106 101  CO2 18* 23 24  GLUCOSE 122* 93 88  BUN 25* 27* 24*  CREATININE 1.47* 1.34 1.15  CALCIUM 9.0 8.6 8.4   Liver Function Tests:  Recent Labs Lab 07/03/14 0210 07/04/14 0357 07/05/14 0326  AST 43* 28 29  ALT 26 20 16   ALKPHOS 88 94 92  BILITOT 1.1 0.9 0.7  PROT 7.5 6.9 7.4  ALBUMIN 3.1* 2.8* 2.7*   No results for input(s): LIPASE, AMYLASE in the last 168 hours. No results for input(s): AMMONIA in the last 168 hours. CBC:  Recent Labs Lab 07/03/14 0210 07/04/14 0357 07/05/14 0326  WBC 5.8 6.7 5.6  NEUTROABS 4.4 4.4 3.7  HGB 8.9* 8.4* 8.5*  HCT 29.9* 27.9* 28.6*  MCV 84.7 83.8 83.4  PLT 308 289 330   Cardiac Enzymes:  Recent Labs Lab 07/03/14 1116 07/03/14 1625  TROPONINI 0.05* 0.06*   BNP: BNP (last 3 results)  Recent Labs  05/20/14 0947 06/06/14 0835 07/03/14  0210  BNP  2249.6* 1732.2* 3703.7*    ProBNP (last 3 results)  Recent Labs  01/01/14 1107 02/07/14 0123 03/01/14 2250  PROBNP 3225.0* 2648.0* 7421.0*    CBG: No results for input(s): GLUCAP in the last 168 hours.   Signed:  Drayden Lukas K  [facility] 07/05/2014, 10:04 AM

## 2014-07-14 NOTE — Addendum Note (Signed)
Addendum  created 07/14/14 1007 by Laretta Alstrom, CRNA   Modules edited: Anesthesia Attestations, Anesthesia Responsible Staff

## 2014-07-22 ENCOUNTER — Emergency Department (HOSPITAL_COMMUNITY): Payer: Medicare Other

## 2014-07-22 ENCOUNTER — Encounter (HOSPITAL_COMMUNITY): Payer: Self-pay | Admitting: Neurology

## 2014-07-22 ENCOUNTER — Inpatient Hospital Stay (HOSPITAL_COMMUNITY)
Admission: EM | Admit: 2014-07-22 | Discharge: 2014-07-26 | DRG: 291 | Disposition: A | Discharge status: Home / Self Care | Payer: Medicare Other | Attending: Internal Medicine | Admitting: Internal Medicine

## 2014-07-22 DIAGNOSIS — N183 Chronic kidney disease, stage 3 unspecified: Secondary | ICD-10-CM | POA: Diagnosis present

## 2014-07-22 DIAGNOSIS — Z7982 Long term (current) use of aspirin: Secondary | ICD-10-CM

## 2014-07-22 DIAGNOSIS — I129 Hypertensive chronic kidney disease with stage 1 through stage 4 chronic kidney disease, or unspecified chronic kidney disease: Secondary | ICD-10-CM | POA: Diagnosis present

## 2014-07-22 DIAGNOSIS — J9601 Acute respiratory failure with hypoxia: Secondary | ICD-10-CM | POA: Diagnosis present

## 2014-07-22 DIAGNOSIS — G40909 Epilepsy, unspecified, not intractable, without status epilepticus: Secondary | ICD-10-CM | POA: Diagnosis not present

## 2014-07-22 DIAGNOSIS — E119 Type 2 diabetes mellitus without complications: Secondary | ICD-10-CM | POA: Diagnosis present

## 2014-07-22 DIAGNOSIS — I255 Ischemic cardiomyopathy: Secondary | ICD-10-CM | POA: Diagnosis present

## 2014-07-22 DIAGNOSIS — I10 Essential (primary) hypertension: Secondary | ICD-10-CM | POA: Diagnosis not present

## 2014-07-22 DIAGNOSIS — K7460 Unspecified cirrhosis of liver: Secondary | ICD-10-CM | POA: Diagnosis present

## 2014-07-22 DIAGNOSIS — D649 Anemia, unspecified: Secondary | ICD-10-CM | POA: Diagnosis present

## 2014-07-22 DIAGNOSIS — R1084 Generalized abdominal pain: Secondary | ICD-10-CM

## 2014-07-22 DIAGNOSIS — G4733 Obstructive sleep apnea (adult) (pediatric): Secondary | ICD-10-CM | POA: Diagnosis present

## 2014-07-22 DIAGNOSIS — Z885 Allergy status to narcotic agent status: Secondary | ICD-10-CM

## 2014-07-22 DIAGNOSIS — Z8719 Personal history of other diseases of the digestive system: Secondary | ICD-10-CM

## 2014-07-22 DIAGNOSIS — R188 Other ascites: Secondary | ICD-10-CM | POA: Diagnosis present

## 2014-07-22 DIAGNOSIS — Z9114 Patient's other noncompliance with medication regimen: Secondary | ICD-10-CM | POA: Diagnosis present

## 2014-07-22 DIAGNOSIS — R599 Enlarged lymph nodes, unspecified: Secondary | ICD-10-CM | POA: Diagnosis present

## 2014-07-22 DIAGNOSIS — I5021 Acute systolic (congestive) heart failure: Secondary | ICD-10-CM

## 2014-07-22 DIAGNOSIS — I509 Heart failure, unspecified: Secondary | ICD-10-CM

## 2014-07-22 DIAGNOSIS — I700 Atherosclerosis of aorta: Secondary | ICD-10-CM | POA: Diagnosis present

## 2014-07-22 DIAGNOSIS — Z9111 Patient's noncompliance with dietary regimen: Secondary | ICD-10-CM | POA: Diagnosis present

## 2014-07-22 DIAGNOSIS — I739 Peripheral vascular disease, unspecified: Secondary | ICD-10-CM | POA: Diagnosis present

## 2014-07-22 DIAGNOSIS — I2510 Atherosclerotic heart disease of native coronary artery without angina pectoris: Secondary | ICD-10-CM | POA: Diagnosis present

## 2014-07-22 DIAGNOSIS — I5043 Acute on chronic combined systolic (congestive) and diastolic (congestive) heart failure: Principal | ICD-10-CM | POA: Diagnosis present

## 2014-07-22 DIAGNOSIS — F17210 Nicotine dependence, cigarettes, uncomplicated: Secondary | ICD-10-CM | POA: Diagnosis present

## 2014-07-22 DIAGNOSIS — Z951 Presence of aortocoronary bypass graft: Secondary | ICD-10-CM | POA: Diagnosis not present

## 2014-07-22 DIAGNOSIS — E785 Hyperlipidemia, unspecified: Secondary | ICD-10-CM | POA: Diagnosis present

## 2014-07-22 DIAGNOSIS — N179 Acute kidney failure, unspecified: Secondary | ICD-10-CM | POA: Diagnosis not present

## 2014-07-22 DIAGNOSIS — I272 Other secondary pulmonary hypertension: Secondary | ICD-10-CM | POA: Diagnosis present

## 2014-07-22 DIAGNOSIS — F1410 Cocaine abuse, uncomplicated: Secondary | ICD-10-CM | POA: Diagnosis present

## 2014-07-22 LAB — CBC
HEMATOCRIT: 28.2 % — AB (ref 39.0–52.0)
HEMATOCRIT: 27.8 % — AB (ref 39.0–52.0)
Hemoglobin: 8.7 g/dL — ABNORMAL LOW (ref 13.0–17.0)
Hemoglobin: 8.4 g/dL — ABNORMAL LOW (ref 13.0–17.0)
MCH: 25.8 pg — AB (ref 26.0–34.0)
MCH: 25.5 pg — ABNORMAL LOW (ref 26.0–34.0)
MCHC: 30.9 g/dL (ref 30.0–36.0)
MCHC: 30.2 g/dL (ref 30.0–36.0)
MCV: 83.7 fL (ref 78.0–100.0)
MCV: 84.5 fL (ref 78.0–100.0)
Platelets: 322 10*3/uL (ref 150–400)
Platelets: 309 10*3/uL (ref 150–400)
RBC: 3.37 MIL/uL — ABNORMAL LOW (ref 4.22–5.81)
RBC: 3.29 MIL/uL — AB (ref 4.22–5.81)
RDW: 18.9 % — AB (ref 11.5–15.5)
RDW: 18.7 % — AB (ref 11.5–15.5)
WBC: 5.6 10*3/uL (ref 4.0–10.5)
WBC: 5.7 10*3/uL (ref 4.0–10.5)

## 2014-07-22 LAB — HEPATIC FUNCTION PANEL
ALT: 53 U/L (ref 17–63)
AST: 88 U/L — ABNORMAL HIGH (ref 15–41)
Albumin: 3 g/dL — ABNORMAL LOW (ref 3.5–5.0)
Alkaline Phosphatase: 105 U/L (ref 38–126)
BILIRUBIN INDIRECT: 0.6 mg/dL (ref 0.3–0.9)
Bilirubin, Direct: 0.7 mg/dL — ABNORMAL HIGH (ref 0.1–0.5)
TOTAL PROTEIN: 8.2 g/dL — AB (ref 6.5–8.1)
Total Bilirubin: 1.3 mg/dL — ABNORMAL HIGH (ref 0.3–1.2)

## 2014-07-22 LAB — BASIC METABOLIC PANEL
Anion gap: 12 (ref 5–15)
BUN: 25 mg/dL — AB (ref 6–20)
CO2: 20 mmol/L — AB (ref 22–32)
CREATININE: 1.26 mg/dL — AB (ref 0.61–1.24)
Calcium: 8.7 mg/dL — ABNORMAL LOW (ref 8.9–10.3)
Chloride: 103 mmol/L (ref 101–111)
GFR calc non Af Amer: 56 mL/min — ABNORMAL LOW (ref >60)
Glucose, Bld: 91 mg/dL (ref 70–99)
Potassium: 4.8 mmol/L (ref 3.5–5.1)
Sodium: 135 mmol/L (ref 135–145)

## 2014-07-22 LAB — I-STAT TROPONIN, ED
TROPONIN I, POC: 0.03 ng/mL (ref 0.00–0.08)
Troponin i, poc: 0.02 ng/mL (ref 0.00–0.08)

## 2014-07-22 LAB — CREATININE, SERUM
Creatinine, Ser: 1.42 mg/dL — ABNORMAL HIGH (ref 0.61–1.24)
GFR calc Af Amer: 56 mL/min — ABNORMAL LOW (ref >60)
GFR calc non Af Amer: 49 mL/min — ABNORMAL LOW (ref >60)

## 2014-07-22 LAB — BRAIN NATRIURETIC PEPTIDE: B Natriuretic Peptide: 3446.9 pg/mL — ABNORMAL HIGH (ref 0.0–100.0)

## 2014-07-22 LAB — LIPASE, BLOOD: LIPASE: 39 U/L (ref 22–51)

## 2014-07-22 MED ORDER — ONDANSETRON HCL 4 MG/2ML IJ SOLN
4.0000 mg | Freq: Once | INTRAMUSCULAR | Status: AC
  Administered 2014-07-22: 4 mg via INTRAVENOUS
  Filled 2014-07-22: qty 2

## 2014-07-22 MED ORDER — IOHEXOL 300 MG/ML  SOLN
25.0000 mL | Freq: Once | INTRAMUSCULAR | Status: AC | PRN
  Administered 2014-07-22: 25 mL via ORAL

## 2014-07-22 MED ORDER — CLONIDINE HCL 0.2 MG PO TABS
0.2000 mg | ORAL_TABLET | Freq: Two times a day (BID) | ORAL | Status: DC
  Administered 2014-07-23 – 2014-07-26 (×8): 0.2 mg via ORAL
  Filled 2014-07-22 (×9): qty 1

## 2014-07-22 MED ORDER — ISOSORBIDE MONONITRATE ER 30 MG PO TB24
30.0000 mg | ORAL_TABLET | Freq: Every day | ORAL | Status: DC
  Administered 2014-07-23 – 2014-07-25 (×4): 30 mg via ORAL
  Filled 2014-07-22 (×4): qty 1

## 2014-07-22 MED ORDER — GABAPENTIN 100 MG PO CAPS
100.0000 mg | ORAL_CAPSULE | Freq: Two times a day (BID) | ORAL | Status: DC
  Administered 2014-07-22 – 2014-07-23 (×2): 100 mg via ORAL
  Filled 2014-07-22 (×3): qty 1

## 2014-07-22 MED ORDER — PHENYTOIN SODIUM EXTENDED 100 MG PO CAPS
200.0000 mg | ORAL_CAPSULE | Freq: Two times a day (BID) | ORAL | Status: DC
  Filled 2014-07-22: qty 3

## 2014-07-22 MED ORDER — FERROUS SULFATE 325 (65 FE) MG PO TABS
325.0000 mg | ORAL_TABLET | Freq: Three times a day (TID) | ORAL | Status: DC
  Administered 2014-07-23 – 2014-07-26 (×11): 325 mg via ORAL
  Filled 2014-07-22 (×14): qty 1

## 2014-07-22 MED ORDER — ASPIRIN EC 81 MG PO TBEC
81.0000 mg | DELAYED_RELEASE_TABLET | Freq: Every day | ORAL | Status: DC
  Administered 2014-07-22 – 2014-07-26 (×5): 81 mg via ORAL
  Filled 2014-07-22 (×5): qty 1

## 2014-07-22 MED ORDER — ASPIRIN 81 MG PO TABS: 81.0000 mg | ORAL_TABLET | Freq: Every day | ORAL | Status: DC

## 2014-07-22 MED ORDER — ACETAMINOPHEN 325 MG PO TABS
650.0000 mg | ORAL_TABLET | ORAL | Status: DC | PRN
  Filled 2014-07-22: qty 2

## 2014-07-22 MED ORDER — PHENYTOIN SODIUM EXTENDED 100 MG PO CAPS
300.0000 mg | ORAL_CAPSULE | Freq: Every day | ORAL | Status: DC
  Administered 2014-07-22: 300 mg via ORAL
  Filled 2014-07-22 (×2): qty 3

## 2014-07-22 MED ORDER — SODIUM CHLORIDE 0.9 % IJ SOLN
3.0000 mL | Freq: Two times a day (BID) | INTRAMUSCULAR | Status: DC
  Administered 2014-07-22 – 2014-07-26 (×8): 3 mL via INTRAVENOUS

## 2014-07-22 MED ORDER — PHENYTOIN SODIUM EXTENDED 100 MG PO CAPS
300.0000 mg | ORAL_CAPSULE | Freq: Every day | ORAL | Status: DC
  Filled 2014-07-22 (×2): qty 3

## 2014-07-22 MED ORDER — PANTOPRAZOLE SODIUM 40 MG PO TBEC
40.0000 mg | DELAYED_RELEASE_TABLET | Freq: Every day | ORAL | Status: DC
  Administered 2014-07-23 – 2014-07-26 (×4): 40 mg via ORAL
  Filled 2014-07-22 (×3): qty 1

## 2014-07-22 MED ORDER — LISINOPRIL 20 MG PO TABS
20.0000 mg | ORAL_TABLET | Freq: Two times a day (BID) | ORAL | Status: DC
  Administered 2014-07-22 – 2014-07-23 (×3): 20 mg via ORAL
  Filled 2014-07-22 (×5): qty 1

## 2014-07-22 MED ORDER — SODIUM CHLORIDE 0.9 % IV SOLN
250.0000 mL | INTRAVENOUS | Status: DC | PRN
  Administered 2014-07-24: 250 mL via INTRAVENOUS

## 2014-07-22 MED ORDER — AMLODIPINE BESYLATE 10 MG PO TABS
10.0000 mg | ORAL_TABLET | Freq: Every day | ORAL | Status: DC
  Administered 2014-07-23: 10 mg via ORAL
  Filled 2014-07-22: qty 1

## 2014-07-22 MED ORDER — MORPHINE SULFATE 4 MG/ML IJ SOLN
4.0000 mg | Freq: Once | INTRAMUSCULAR | Status: AC
  Administered 2014-07-22: 4 mg via INTRAVENOUS
  Filled 2014-07-22: qty 1

## 2014-07-22 MED ORDER — FUROSEMIDE 10 MG/ML IJ SOLN: 40.0000 mg | Freq: Two times a day (BID) | INTRAMUSCULAR | Status: DC

## 2014-07-22 MED ORDER — ALBUTEROL SULFATE HFA 108 (90 BASE) MCG/ACT IN AERS: 2.0000 | INHALATION_SPRAY | Freq: Four times a day (QID) | RESPIRATORY_TRACT | Status: DC | PRN

## 2014-07-22 MED ORDER — FUROSEMIDE 10 MG/ML IJ SOLN: 40.0000 mg | Freq: Once | INTRAMUSCULAR | Status: DC

## 2014-07-22 MED ORDER — FUROSEMIDE 10 MG/ML IJ SOLN
40.0000 mg | Freq: Three times a day (TID) | INTRAMUSCULAR | Status: DC
  Administered 2014-07-23 (×3): 40 mg via INTRAVENOUS
  Filled 2014-07-22 (×5): qty 4

## 2014-07-22 MED ORDER — CITALOPRAM HYDROBROMIDE 20 MG PO TABS
20.0000 mg | ORAL_TABLET | Freq: Every day | ORAL | Status: DC
  Administered 2014-07-23 – 2014-07-26 (×4): 20 mg via ORAL
  Filled 2014-07-22 (×4): qty 1

## 2014-07-22 MED ORDER — HYDRALAZINE HCL 50 MG PO TABS
100.0000 mg | ORAL_TABLET | Freq: Three times a day (TID) | ORAL | Status: DC
  Administered 2014-07-22 – 2014-07-26 (×11): 100 mg via ORAL
  Filled 2014-07-22 (×14): qty 2

## 2014-07-22 MED ORDER — DIVALPROEX SODIUM ER 500 MG PO TB24
500.0000 mg | ORAL_TABLET | Freq: Two times a day (BID) | ORAL | Status: DC
  Administered 2014-07-22 – 2014-07-26 (×8): 500 mg via ORAL
  Filled 2014-07-22 (×9): qty 1

## 2014-07-22 MED ORDER — FUROSEMIDE 10 MG/ML IJ SOLN
40.0000 mg | Freq: Once | INTRAMUSCULAR | Status: AC
  Administered 2014-07-22: 40 mg via INTRAVENOUS
  Filled 2014-07-22: qty 4

## 2014-07-22 MED ORDER — PHENYTOIN SODIUM EXTENDED 100 MG PO CAPS: 200.0000 mg | ORAL_CAPSULE | Freq: Every day | ORAL | Status: DC

## 2014-07-22 MED ORDER — SODIUM CHLORIDE 0.9 % IJ SOLN: 3.0000 mL | INTRAMUSCULAR | Status: DC | PRN

## 2014-07-22 MED ORDER — PHENYTOIN SODIUM EXTENDED 100 MG PO CAPS
200.0000 mg | ORAL_CAPSULE | Freq: Every day | ORAL | Status: DC
  Administered 2014-07-23: 200 mg via ORAL
  Filled 2014-07-22: qty 2

## 2014-07-22 MED ORDER — HYDRALAZINE HCL 100 MG PO TABS: 100.0000 mg | ORAL_TABLET | Freq: Three times a day (TID) | ORAL | Status: DC

## 2014-07-22 MED ORDER — CARVEDILOL 12.5 MG PO TABS
12.5000 mg | ORAL_TABLET | Freq: Two times a day (BID) | ORAL | Status: DC
  Administered 2014-07-22 – 2014-07-23 (×2): 12.5 mg via ORAL
  Filled 2014-07-22 (×4): qty 1

## 2014-07-22 MED ORDER — ONDANSETRON HCL 4 MG/2ML IJ SOLN: 4.0000 mg | Freq: Four times a day (QID) | INTRAMUSCULAR | Status: DC | PRN

## 2014-07-22 MED ORDER — HEPARIN SODIUM (PORCINE) 5000 UNIT/ML IJ SOLN
5000.0000 [IU] | Freq: Three times a day (TID) | INTRAMUSCULAR | Status: DC
  Administered 2014-07-22: 5000 [IU] via SUBCUTANEOUS
  Filled 2014-07-22 (×6): qty 1

## 2014-07-22 MED ORDER — ALBUTEROL SULFATE (2.5 MG/3ML) 0.083% IN NEBU: 2.5000 mg | INHALATION_SOLUTION | Freq: Four times a day (QID) | RESPIRATORY_TRACT | Status: DC | PRN

## 2014-07-22 NOTE — ED Notes (Signed)
Patient transported to X-ray 

## 2014-07-22 NOTE — ED Notes (Signed)
Per [facility] pt reports sob and leg swelling with abdominal swelling for 8-10 days. Reports has been taking meds, was been to [facility] 4 times in last 8 days, given lasix and discharged. Oxygen 91-96% RA, HR 80, BP 186/116, CBG 101, RR2. Pt is a x 4

## 2014-07-22 NOTE — H&P (Addendum)
[facility] History and Physical  [name] ERD:[id number] DOB: 03-10-1946 DOA: 07/22/2014   PCP: [name]    Chief Complaint: abdominal distension and pain  HPI: Charles [name] is a 69 y.o. male with past medical history of chronic systolic and diastolic heart failure, ischemic cardiomyopathy, obstructive sleep apnea not on C Pap at home, polysubstance abuse, hypertension, seizure disorder who presents to the hospital for abdominal distention and abdominal pain. Workup with a CT of the abdomen was performed which was negative for any acute abdominal etiology. He was however found to be hypoxic down to 88% on room air, short of breath with exertion with tachycardia on exertion. He has not been taking his diuretics appropriately at home. In fact he states he takes his medications only once a day although a number of them are supposed to be taken twice a day. He states has gained about 8 pounds from his baseline weight. He is going to be admitted for acute on chronic heart failure for diuresis. He was admitted on 4/15 for an acute heart failure exacerbation as well.  Dry weight on discharge was 78 kg. Weight today is 83 kg.    General: The patient denies anorexia, fever, weight loss Cardiac: Denies chest pain, syncope, palpitations, pedal edema  Respiratory: + cough x 3 days- productive of white phlegm, + shortness of breath, no wheezing GI: Denies severe indigestion/heartburn, abdominal pain, nausea, vomiting, diarrhea and constipation GU: Denies hematuria, incontinence, dysuria  Musculoskeletal: Denies arthritis  Skin: Denies suspicious skin lesions Neurologic: Denies focal weakness or numbness, change in vision Psychiatry: Denies depression or anxiety. Hematologic: no easy bruising or bleeding   Past Medical History  Diagnosis Date  . Iron deficiency anemia   . Chronic combined systolic and diastolic CHF, NYHA class 2     a. 11/2013 Echo: EF 40-45%, basl-mid  inflat AK, Gr 2 DD.  [name] Hyperlipidemia LDL goal <70   . Obstructive sleep apnea     a. not on home CPAP  . Ischemic cardiomyopathy     a. 11/2013 Echo: EF 40-45%, basl-mid inflat AK, Gr 2 DD, mild AI, mod dil LA/RA, mod reduced RV fxn, PASP 57mmHg.  . Moderate to severe pulmonary hypertension 03/2010    a. PA peak pressure of 76 mmHg (per 2D Echo 03/2010)  . Polysubstance abuse     a. cocaine, THC  . AV malformation of gastrointestinal tract     a. w/ h/o GIB.  [name] Family history of early CAD   . DDD (degenerative disc disease), lumbar   . Peripheral vascular disease   . Seizure disorder   . Hypertension   . CAD (coronary artery disease)     a. s/p 3-vessel CABG (12/2007) // 100% RCA stenosis with collaterals from left system. Severe bifurcation lesions of proxima CXA and OM. Moderate LAD disease - followed by Dr. Wyline Copas in [facility];  b. 11/2013 Myoview: Large fixed inf defect w/o ischemia, EF 35%.  . Chronic kidney disease (CKD), stage III (moderate)     a. BL SCr 1.5-1.6  . Renal artery stenosis   . Seizures     Past Surgical History  Procedure Laterality Date  . Apc  03/2010    To treat small bowel AVMs  . Esophagogastroduodenoscopy N/A 08/14/2012    Procedure: ESOPHAGOGASTRODUODENOSCOPY (EGD);  Surgeon: [name], MD;  Location: [facility] ENDOSCOPY;  Service: Endoscopy;  Laterality: N/A;  . Hot hemostasis N/A 08/14/2012    Procedure: HOT HEMOSTASIS (ARGON PLASMA COAGULATION/BICAP);  Surgeon: [name], MD;  Location: [facility] ENDOSCOPY;  Service: Endoscopy;  Laterality: N/A;  . Esophagogastroduodenoscopy (egd) with propofol N/A 08/04/2013    Procedure: ESOPHAGOGASTRODUODENOSCOPY (EGD) WITH PROPOFOL;  Surgeon: [name], MD;  Location: [facility] ENDOSCOPY;  Service: Endoscopy;  Laterality: N/A;  . Coronary artery bypass graft  12/2007  . Esophagogastroduodenoscopy (egd) with propofol N/A 06/08/2014    Procedure: ESOPHAGOGASTRODUODENOSCOPY (EGD) WITH PROPOFOL;  Surgeon: Milus Banister, MD;   Location: [place];  Service: Endoscopy;  Laterality: N/A;    Social History: does not smoke cigarettes, stopped smoking in 2009- drinks a beer every now and then- denies any current drug use. Lives at home with daughter.     Allergies  Allergen Reactions  . Motrin [Ibuprofen] Other (See Comments)    Affects kidneys  . Tylenol [Acetaminophen] Other (See Comments)    Affects kidneys    Family History  Problem Relation Age of Onset  . Heart disease Mother     unknown type  . Heart disease Father 36    died of MI at 78yo  . Heart disease Paternal Grandfather 17    died of MI  . Heart disease    . Heart disease Brother 30     Prior to Admission medications   Medication Sig Start Date End Date Taking? Authorizing Provider  albuterol (PROVENTIL HFA;VENTOLIN HFA) 108 (90 BASE) MCG/ACT inhaler Inhale 2 puffs into the lungs every 6 (six) hours as needed for wheezing or shortness of breath. 04/30/14  Yes Thurnell Lose, MD  aspirin 81 MG tablet Take 81 mg by mouth daily.   Yes Historical Provider, MD  carvedilol (COREG) 12.5 MG tablet Take 12.5 mg by mouth 2 (two) times daily. 06/30/14  Yes Historical Provider, MD  citalopram (CELEXA) 20 MG tablet Take 20 mg by mouth daily.   Yes Historical Provider, MD  cloNIDine (CATAPRES) 0.2 MG tablet Take 0.5 tablets (0.1 mg total) by mouth 2 (two) times daily. Patient taking differently: Take 0.2 mg by mouth 2 (two) times daily.  02/09/14  Yes Modena Jansky, MD  divalproex (DEPAKOTE ER) 500 MG 24 hr tablet Take 500 mg by mouth 2 (two) times daily. 05/29/14  Yes Historical Provider, MD  ferrous sulfate 325 (65 FE) MG tablet Take 325 mg by mouth 3 (three) times daily with meals.    Yes Historical Provider, MD  furosemide (LASIX) 40 MG tablet Take 40 mg by mouth daily. 05/29/14  Yes Historical Provider, MD  hydrALAZINE (APRESOLINE) 100 MG tablet Take 100 mg by mouth 3 (three) times daily. 05/29/14  Yes Historical Provider, MD  lisinopril  (PRINIVIL,ZESTRIL) 20 MG tablet Take 20 mg by mouth 2 (two) times daily. 06/02/14  Yes Historical Provider, MD  pantoprazole (PROTONIX) 40 MG tablet Take 40 mg by mouth daily.    Yes Historical Provider, MD  simvastatin (ZOCOR) 40 MG tablet Take 40 mg by mouth daily at 6 PM.  06/30/14  Yes Historical Provider, MD  amLODipine (NORVASC) 10 MG tablet Take 10 mg by mouth daily.    Historical Provider, MD  cyclobenzaprine (FLEXERIL) 5 MG tablet Take 1 tablet (5 mg total) by mouth 3 (three) times daily as needed for muscle spasms (chest and leg pains). 07/05/14   Donne Hazel, MD  furosemide (LASIX) 80 MG tablet Take 80 mg by mouth daily. 06/02/14   Historical Provider, MD  gabapentin (NEURONTIN) 100 MG capsule Take 100 mg by mouth 2 (two) times daily.    Historical Provider,  MD  isosorbide mononitrate (IMDUR) 30 MG 24 hr tablet Take 30 mg by mouth daily.    Historical Provider, MD  nitroGLYCERIN (NITROSTAT) 0.4 MG SL tablet Place 1 tablet (0.4 mg total) under the tongue every 5 (five) minutes as needed for chest pain (upto max 3 doses at one time.). 02/09/14   Modena Jansky, MD  phenytoin (DILANTIN) 100 MG ER capsule Take 200-300 mg by mouth 2 (two) times daily. 200mg  in the morning, 300mg  in the evening    Historical Provider, MD  pravastatin (PRAVACHOL) 40 MG tablet Take 1 tablet (40 mg total) by mouth daily at 6 PM. 02/09/14   Modena Jansky, MD     Physical Exam: Filed Vitals:   07/22/14 1500 07/22/14 1530 07/22/14 1600 07/22/14 1602  BP: 156/86 132/87 142/94 148/79  Pulse: 90 120 77   Temp:    97.8 F (36.6 C)  TempSrc:    Oral  Resp: 32 23 18 23   Weight:      SpO2: 88% 81% 94% 94%     General: Awake alert oriented 3, no acute distress HEENT: Normocephalic and Atraumatic, Mucous membranes pink                PERRLA; EOM intact; No scleral icterus,                 Nares: Patent, Oropharynx: Clear, Fair Dentition                 Neck: FROM, no cervical lymphadenopathy, thyromegaly,  carotid bruit or JVD;  Breasts: deferred CHEST WALL: No tenderness  CHEST: Normal respiration, crackles at bilateral bases- HEART: Regular rate and rhythm; no murmurs rubs or gallops  BACK: No kyphosis or scoliosis; no CVA tenderness  GI: Positive Bowel Sounds, soft, tender in epigastrium and periumbilical area, distended and tympanic to percussion; no masses, no organomegaly Rectal Exam: deferred MSK: No cyanosis, clubbing- 2+ pedal edema Genitalia: not examined  SKIN:  no rash or ulceration  CNS: Alert and Oriented x 4, Nonfocal exam, CN 2-12 intact  Labs on Admission:  Basic Metabolic Panel:  Recent Labs Lab 07/22/14 1120  NA 135  K 4.8  CL 103  CO2 20*  GLUCOSE 91  BUN 25*  CREATININE 1.26*  CALCIUM 8.7*   Liver Function Tests:  Recent Labs Lab 07/22/14 1120  AST 88*  ALT 53  ALKPHOS 105  BILITOT 1.3*  PROT 8.2*  ALBUMIN 3.0*    Recent Labs Lab 07/22/14 1120  LIPASE 39   No results for input(s): AMMONIA in the last 168 hours. CBC:  Recent Labs Lab 07/22/14 1120  WBC 5.6  HGB 8.7*  HCT 28.2*  MCV 83.7  PLT 322   Cardiac Enzymes: No results for input(s): CKTOTAL, CKMB, CKMBINDEX, TROPONINI in the last 168 hours.  BNP (last 3 results)  Recent Labs  06/06/14 0835 07/03/14 0210 07/22/14 1120  BNP 1732.2* 3703.7* 3446.9*    ProBNP (last 3 results)  Recent Labs  01/01/14 1107 02/07/14 0123 03/01/14 2250  PROBNP 3225.0* 2648.0* 7421.0*    CBG: No results for input(s): GLUCAP in the last 168 hours.  Radiological Exams on Admission: Ct Abdomen Pelvis Wo Contrast  07/22/2014   CLINICAL DATA:  Abdominal pain and lower extremity swelling for 4 days. History of cirrhosis.  EXAM: CT ABDOMEN AND PELVIS WITHOUT CONTRAST  TECHNIQUE: Multidetector CT imaging of the abdomen and pelvis was performed following the standard protocol without IV contrast.  COMPARISON:  CT  angiogram chest, abdomen and pelvis 04/26/2014. CT abdomen and pelvis 04/02/2014.   FINDINGS: The lung bases show some mild atelectasis. Trace left pleural effusion is noted. There is no right effusion or pericardial effusion. There is marked cardiomegaly.  Again seen is a somewhat shrunken liver with a nodular border consistent with cirrhosis. No focal liver lesion is identified. There is a small volume of abdominal and pelvic ascites. Diffuse body wall edema is noted. The spleen, adrenal glands, pancreas and kidneys are unremarkable. Calcifications about the kidneys are likely related to vascular disease. The patient has extensive aortoiliac atherosclerosis without aneurysm.  The stomach, small and large bowel and appendix appear normal. No lymphadenopathy is seen. No focal fluid collection is identified.  Convex left lumbar scoliosis and lower lumbar degenerative disc disease is identified. No lytic or sclerotic bony lesion is seen.  IMPRESSION: No acute abnormality abdomen or pelvis.  Cirrhotic liver with an associated small volume of abdominal and pelvic ascites. Body wall edema may be related to hepatic insufficiency.  Extensive atherosclerotic vascular disease.  Cardiomegaly.   Electronically Signed   By: Inge Rise M.D.   On: 07/22/2014 16:57   Dg Chest 2 View  07/22/2014   CLINICAL DATA:  Shortness of breath. Lower extremity edema. Congestive heart failure. Chronic kidney disease stage 3. Pulmonary hypertension.  EXAM: CHEST  2 VIEW  COMPARISON:  07/17/2014  FINDINGS: Moderate cardiomegaly and pulmonary vascular congestion are again seen. No evidence of frank pulmonary edema or focal consolidation. No evidence pleural effusion. Prior CABG noted.  IMPRESSION: Moderate cardiomegaly and chronic pulmonary venous hypertension. No acute findings.   Electronically Signed   By: Earle Gell M.D.   On: 07/22/2014 11:58   US Abdomen Limited  07/22/2014   CLINICAL DATA:  Right upper quadrant pain today.  EXAM: US ABDOMEN LIMITED - RIGHT UPPER QUADRANT  COMPARISON:  CT abdomen and pelvis  04/23/2014  FINDINGS: Gallbladder:  No gallstones or wall thickening visualized. No sonographic Murphy sign noted.  Common bile duct:  Diameter: 0.5 cm  Liver:  Nodular border is identified. There is hepatomegaly with the liver measuring 26 cm craniocaudal. No focal lesion or intrahepatic biliary ductal dilatation is identified. Normal hepatopetal flow is seen in the portal vein. Small amount of perihepatic ascites is noted.  IMPRESSION: Negative for gallstones.  Nodular border of the liver compatible with cirrhosis. Small amount of perihepatic ascites is noted.   Electronically Signed   By: Inge Rise M.D.   On: 07/22/2014 14:20    EKG: Independently reviewed. Sinus rhythm at 70 bpm  Assessment/Plan Principal Problem:   Acute on chronic combined systolic and diastolic congestive heart failure- acute hypoxic respiratory failure -EF 45-50%, diffuse hypokinesis, grade 2 diastolic dysfunction (ECHO 05/22/14) -Despite abdominal distention, CT of the abdomen shows only a minimal amount of ascites - Start IV Lasix-monitor I&O and daily weight -As he recently had a 2-D echo last month and,  I will not be repeating it - cont ACE I    Active Problems: Chronic kidney disease (CKD), stage III (moderate) -Renal function is baseline    Seizure disorder -Resume antiepileptics  CAD status post CABG -Continue baby aspirin  Hypertension -Continue home medications  Cirrhosis of the liver -Noted on CT abdomen today  Polysubstance abuse -Positive for cocaine in April but states now that he has stopped using it  Noncompliance with medications -Have educated him on taking his medications properly -We'll need to reinforce this education prior to his discharge  Consulted:   Code Status: Full code Family Communication:   DVT Prophylaxis: Heparin  Time spent: 63 min  [place], MD [facility]  If 7PM-7AM, please contact night-coverage [email or web address] 07/22/2014, 5:45 PM

## 2014-07-22 NOTE — Progress Notes (Signed)
[id number] Report recieevd from Brittany<Rn Ed

## 2014-07-22 NOTE — ED Provider Notes (Signed)
CSN: [id number]     Arrival date & time 07/22/14  1057 History   First MD Initiated Contact with Patient 07/22/14 1139     Chief Complaint  Patient presents with  . Shortness of Breath     (Consider location/radiation/quality/duration/timing/severity/associated sxs/prior Treatment) HPI Comments: 69 y.o. year old male with significant past medical history of polysubstance abuse, CAD s/p CABG,  ischemic cardiomyopathy-combined systolic and diastolic CHF (09-62%), stage III CKD presenting with chest pain. Pt comes in with abdominal pain, dib and leg swelling. He reports that his legs have been swelling for the last 1-2 weeks. He appears to have had 7 Kg weight increase. Pt reports that he has dib with exertion, but sometimes also when he is laying. Pt has increased abdominal distention and pain. There is no hx of liver disease. Pt has been taking his meds as prescribed. PT has no chest pain.   ROS 10 Systems reviewed and are negative for acute change except as noted in the HPI.     Patient is a 69 y.o. male presenting with shortness of breath. The history is provided by the patient.  Shortness of Breath Associated symptoms: abdominal pain     Past Medical History  Diagnosis Date  . Iron deficiency anemia   . Chronic combined systolic and diastolic CHF, NYHA class 2     a. 11/2013 Echo: EF 40-45%, basl-mid inflat AK, Gr 2 DD.  [name] Hyperlipidemia LDL goal <70   . Obstructive sleep apnea     a. not on home CPAP  . Ischemic cardiomyopathy     a. 11/2013 Echo: EF 40-45%, basl-mid inflat AK, Gr 2 DD, mild AI, mod dil LA/RA, mod reduced RV fxn, PASP 18mmHg.  [name] Homelessness   . Moderate to severe pulmonary hypertension 03/2010    a. PA peak pressure of 76 mmHg (per 2D Echo 03/2010)  . Polysubstance abuse     a. cocaine, THC  . AV malformation of gastrointestinal tract     a. w/ h/o GIB.  [name] Family history of early CAD   . DDD (degenerative disc disease), lumbar   . Peripheral vascular  disease   . Seizure disorder   . Hypertension   . CAD (coronary artery disease)     a. s/p 3-vessel CABG (12/2007) // 100% RCA stenosis with collaterals from left system. Severe bifurcation lesions of proxima CXA and OM. Moderate LAD disease - followed by Dr. Wyline Copas in [facility];  b. 11/2013 Myoview: Large fixed inf defect w/o ischemia, EF 35%.  . Diabetes   . Chronic kidney disease (CKD), stage III (moderate)     a. BL SCr 1.5-1.6  . Renal artery stenosis   . Seizures    Past Surgical History  Procedure Laterality Date  . Apc  03/2010    To treat small bowel AVMs  . Esophagogastroduodenoscopy N/A 08/14/2012    Procedure: ESOPHAGOGASTRODUODENOSCOPY (EGD);  Surgeon: [name], MD;  Location[facility] ENDOSCOPY;  Service: Endoscopy;  Laterality: N/A;  . Hot hemostasis N/A 08/14/2012    Procedure: HOT HEMOSTASIS (ARGON PLASMA COAGULATION/BICAP);  Surgeon: [name], MD;  Location: [facility] ENDOSCOPY;  Service: Endoscopy;  Laterality: N/A;  . Esophagogastroduodenoscopy (egd) with propofol N/A 08/04/2013    Procedure: ESOPHAGOGASTRODUODENOSCOPY (EGD) WITH PROPOFOL;  Surgeon: [name], MD;  Location: [facility] ENDOSCOPY;  Service: Endoscopy;  Laterality: N/A;  . Coronary artery bypass graft  12/2007  . Esophagogastroduodenoscopy (egd) with propofol N/A 06/08/2014    Procedure: ESOPHAGOGASTRODUODENOSCOPY (EGD) WITH PROPOFOL;  Surgeon: Milus Banister, MD;  Location: [place];  Service: Endoscopy;  Laterality: N/A;   Family History  Problem Relation Age of Onset  . Heart disease Mother     unknown type  . Heart disease Father 46    died of MI at 62yo  . Heart disease Paternal Grandfather 21    died of MI  . Heart disease    . Heart disease Brother 30   History  Substance Use Topics  . Smoking status: Current Some Day Smoker -- 0.25 packs/day for 56 years    Types: Cigarettes  . Smokeless tobacco: Never Used  . Alcohol Use: Yes     Comment: occasionally drinks, a few times a month    Review  of Systems  Respiratory: Positive for shortness of breath.   Gastrointestinal: Positive for abdominal pain and abdominal distention.  Hematological: Does not bruise/bleed easily.  All other systems reviewed and are negative.     Allergies  Motrin and Tylenol  Home Medications   Prior to Admission medications   Medication Sig Start Date End Date Taking? Authorizing Provider  albuterol (PROVENTIL HFA;VENTOLIN HFA) 108 (90 BASE) MCG/ACT inhaler Inhale 2 puffs into the lungs every 6 (six) hours as needed for wheezing or shortness of breath. 04/30/14  Yes Thurnell Lose, MD  aspirin 81 MG tablet Take 81 mg by mouth daily.   Yes Historical Provider, MD  carvedilol (COREG) 12.5 MG tablet Take 12.5 mg by mouth 2 (two) times daily. 06/30/14  Yes Historical Provider, MD  citalopram (CELEXA) 20 MG tablet Take 20 mg by mouth daily.   Yes Historical Provider, MD  cloNIDine (CATAPRES) 0.2 MG tablet Take 0.5 tablets (0.1 mg total) by mouth 2 (two) times daily. Patient taking differently: Take 0.2 mg by mouth 2 (two) times daily.  02/09/14  Yes Modena Jansky, MD  divalproex (DEPAKOTE ER) 500 MG 24 hr tablet Take 500 mg by mouth 2 (two) times daily. 05/29/14  Yes Historical Provider, MD  ferrous sulfate 325 (65 FE) MG tablet Take 325 mg by mouth 3 (three) times daily with meals.    Yes Historical Provider, MD  furosemide (LASIX) 40 MG tablet Take 40 mg by mouth daily. 05/29/14  Yes Historical Provider, MD  hydrALAZINE (APRESOLINE) 100 MG tablet Take 100 mg by mouth 3 (three) times daily. 05/29/14  Yes Historical Provider, MD  lisinopril (PRINIVIL,ZESTRIL) 20 MG tablet Take 20 mg by mouth 2 (two) times daily. 06/02/14  Yes Historical Provider, MD  pantoprazole (PROTONIX) 40 MG tablet Take 40 mg by mouth daily.    Yes Historical Provider, MD  simvastatin (ZOCOR) 40 MG tablet Take 40 mg by mouth daily at 6 PM.  06/30/14  Yes Historical Provider, MD  amLODipine (NORVASC) 10 MG tablet Take 10 mg by mouth daily.     Historical Provider, MD  cyclobenzaprine (FLEXERIL) 5 MG tablet Take 1 tablet (5 mg total) by mouth 3 (three) times daily as needed for muscle spasms (chest and leg pains). 07/05/14   Donne Hazel, MD  furosemide (LASIX) 80 MG tablet Take 80 mg by mouth daily. 06/02/14   Historical Provider, MD  gabapentin (NEURONTIN) 100 MG capsule Take 100 mg by mouth 2 (two) times daily.    Historical Provider, MD  isosorbide mononitrate (IMDUR) 30 MG 24 hr tablet Take 30 mg by mouth daily.    Historical Provider, MD  nitroGLYCERIN (NITROSTAT) 0.4 MG SL tablet Place 1 tablet (0.4 mg total) under the tongue  every 5 (five) minutes as needed for chest pain (upto max 3 doses at one time.). 02/09/14   Modena Jansky, MD  phenytoin (DILANTIN) 100 MG ER capsule Take 200-300 mg by mouth 2 (two) times daily. 200mg  in the morning, 300mg  in the evening    Historical Provider, MD  pravastatin (PRAVACHOL) 40 MG tablet Take 1 tablet (40 mg total) by mouth daily at 6 PM. 02/09/14   Modena Jansky, MD   BP 148/79 mmHg  Pulse 77  Temp(Src) 97.8 F (36.6 C) (Oral)  Resp 23  Wt 183 lb 1 oz (83.037 kg)  SpO2 94% Physical Exam  Constitutional: He is oriented to person, place, and time. He appears well-developed.  HENT:  Head: Normocephalic and atraumatic.  Eyes: Conjunctivae and EOM are normal. Pupils are equal, round, and reactive to light.  Neck: Normal range of motion. Neck supple.  Cardiovascular: Normal rate and regular rhythm.   Murmur heard. Pulmonary/Chest: Effort normal and breath sounds normal.  Abdominal: Soft. Bowel sounds are normal. He exhibits distension. There is tenderness. There is guarding. There is no rebound.  Generalized tenderness, with pain in the epigastrium and RUQ being most pronounced.  Musculoskeletal: He exhibits edema and tenderness.  2+ pitting edema  Neurological: He is alert and oriented to person, place, and time.  Skin: Skin is warm.  Nursing note and vitals reviewed.   ED  Course  Procedures (including critical care time) Labs Review Labs Reviewed  CBC - Abnormal; Notable for the following:    RBC 3.37 (*)    Hemoglobin 8.7 (*)    HCT 28.2 (*)    MCH 25.8 (*)    RDW 18.9 (*)    All other components within normal limits  BASIC METABOLIC PANEL - Abnormal; Notable for the following:    CO2 20 (*)    BUN 25 (*)    Creatinine, Ser 1.26 (*)    Calcium 8.7 (*)    GFR calc non Af Amer 56 (*)    All other components within normal limits  BRAIN NATRIURETIC PEPTIDE - Abnormal; Notable for the following:    B Natriuretic Peptide 3446.9 (*)    All other components within normal limits  HEPATIC FUNCTION PANEL - Abnormal; Notable for the following:    Total Protein 8.2 (*)    Albumin 3.0 (*)    AST 88 (*)    Total Bilirubin 1.3 (*)    Bilirubin, Direct 0.7 (*)    All other components within normal limits  LIPASE, BLOOD  I-STAT TROPOININ, ED  Randolm Idol, ED    Imaging Review Dg Chest 2 View  07/22/2014   CLINICAL DATA:  Shortness of breath. Lower extremity edema. Congestive heart failure. Chronic kidney disease stage 3. Pulmonary hypertension.  EXAM: CHEST  2 VIEW  COMPARISON:  07/17/2014  FINDINGS: Moderate cardiomegaly and pulmonary vascular congestion are again seen. No evidence of frank pulmonary edema or focal consolidation. No evidence pleural effusion. Prior CABG noted.  IMPRESSION: Moderate cardiomegaly and chronic pulmonary venous hypertension. No acute findings.   Electronically Signed   By: Earle Gell M.D.   On: 07/22/2014 11:58   US Abdomen Limited  07/22/2014   CLINICAL DATA:  Right upper quadrant pain today.  EXAM: US ABDOMEN LIMITED - RIGHT UPPER QUADRANT  COMPARISON:  CT abdomen and pelvis 04/23/2014  FINDINGS: Gallbladder:  No gallstones or wall thickening visualized. No sonographic Murphy sign noted.  Common bile duct:  Diameter: 0.5 cm  Liver:  Nodular border is identified. There is hepatomegaly with the liver measuring 26 cm craniocaudal.  No focal lesion or intrahepatic biliary ductal dilatation is identified. Normal hepatopetal flow is seen in the portal vein. Small amount of perihepatic ascites is noted.  IMPRESSION: Negative for gallstones.  Nodular border of the liver compatible with cirrhosis. Small amount of perihepatic ascites is noted.   Electronically Signed   By: Inge Rise M.D.   On: 07/22/2014 14:20     EKG Interpretation   Date/Time:  Wednesday Jul 22 2014 11:08:53 EDT Ventricular Rate:  70 PR Interval:  172 QRS Duration: 119 QT Interval:  406 QTC Calculation: 438 R Axis:   78 Text Interpretation:  Sinus rhythm Atrial premature complex Nonspecific  intraventricular conduction delay Probable inferior infarct, age  indeterminate No significant change since last tracing Confirmed by  Kathrynn Humble, MD, Thelma Comp [phone]) on 07/22/2014 3:57:21 PM        CT scan from 04/2014: IMPRESSION: CT CHEST IMPRESSION  1. Mild motion degradation. 2. No evidence of thoracic aortic dissection. Mild ascending aortic prominence, as detailed above. Recommend annual imaging followup by CTA or MRA. This recommendation follows 2010 ACCF/AHA/AATS/ACR/ASA/SCA/SCAI/SIR/STS/SVM Guidelines for the Diagnosis and Management of Patients With Thoracic Aortic Disease. Circulation. 2010; 121: W263-Z858 3. Probable congestive heart failure, with bilateral pleural effusions. 4. Pulmonary artery enlargement suggests pulmonary arterial hypertension. 5. Similar mediastinal adenopathy. This could be related to congestive heart failure. Recommend attention on follow-up.  CT ABDOMEN AND PELVIS IMPRESSION  1. Mild motion degradation. 2. Advanced aortic atherosclerosis, without dissection or aneurysm. 3. Abdominal pelvic ascites. Suspect mild cirrhosis. 4. Peripancreatic interstitial thickening. Cannot exclude pancreatitis. This appearance is nonspecific in the setting of ascites. 5. Pericystic edema. This could represent cystitis.  Increased density within the urinary bladder could represent Early contrast excretion or hemorrhage. Consider correlation with urinalysis. MDM   Final diagnoses:  Acute systolic congestive heart failure  Generalized abdominal pain    Pt comes in with cc of abd poin, dib, and leg pain. He has hx of CHF. Abd is distended. He has diffuse tenderness, worst over the epigastrium and RUQ. [place] abd ordered and neg. Pt is still having abdominal pain. With his comorbidities - mesenteric ischemia, perforated viscus, sbo, peritoneal infection considered. Pain is reproducible. Pt has cocaine hx. He had CT-A dissection protocol in Feb, and it was neg for acute process. Pt has no back pain either. With the [place] neg, CT a chest and abd neg recently, we will get CT non contrast, to ensure there is no perforation/obstruction/signs of infection. He had some thoracic aorta widening, but no chest pain, back pain now, and equal and intact pulses in the radial and femoral region is reassuring. Benefor of iv contrast doesn't outweigh the risk, due to the CKD and neg CTa recently  Pt has dib with exertion and leg edema. He has 7 KG weight gain. No anasarca. CXR shows some congestion. Will diurese. Will admit, acute CHF exacerbation.  Varney Biles, MD 07/22/14 1659

## 2014-07-22 NOTE — ED Notes (Signed)
Placed patient on O2 via [place] at 2lpm. 

## 2014-07-22 NOTE — ED Notes (Addendum)
Admitting MD at bedside.

## 2014-07-22 NOTE — Progress Notes (Signed)
Patient is refusing CPAP tonight. No machine in room. RT made patient aware that if she changed her mind to call.

## 2014-07-22 NOTE — ED Notes (Signed)
CT contacted; pt finished drinking contrast

## 2014-07-23 DIAGNOSIS — J9601 Acute respiratory failure with hypoxia: Secondary | ICD-10-CM

## 2014-07-23 LAB — BASIC METABOLIC PANEL
Anion gap: 8 (ref 5–15)
BUN: 26 mg/dL — ABNORMAL HIGH (ref 6–20)
CHLORIDE: 104 mmol/L (ref 101–111)
CO2: 23 mmol/L (ref 22–32)
CREATININE: 1.43 mg/dL — AB (ref 0.61–1.24)
Calcium: 8.4 mg/dL — ABNORMAL LOW (ref 8.9–10.3)
GFR calc Af Amer: 56 mL/min — ABNORMAL LOW (ref >60)
GFR calc non Af Amer: 48 mL/min — ABNORMAL LOW (ref >60)
Glucose, Bld: 139 mg/dL — ABNORMAL HIGH (ref 70–99)
Potassium: 4 mmol/L (ref 3.5–5.1)
Sodium: 135 mmol/L (ref 135–145)

## 2014-07-23 LAB — PHENYTOIN LEVEL, TOTAL: PHENYTOIN LVL: 2.8 ug/mL — AB (ref 10.0–20.0)

## 2014-07-23 LAB — MRSA PCR SCREENING: MRSA BY PCR: POSITIVE — AB

## 2014-07-23 MED ORDER — HYDROCODONE-ACETAMINOPHEN 5-325 MG PO TABS
1.0000 | ORAL_TABLET | ORAL | Status: DC | PRN
  Administered 2014-07-24: 1 via ORAL
  Filled 2014-07-23: qty 1

## 2014-07-23 MED ORDER — FUROSEMIDE 10 MG/ML IJ SOLN
80.0000 mg | Freq: Two times a day (BID) | INTRAMUSCULAR | Status: DC
  Administered 2014-07-23: 80 mg via INTRAVENOUS
  Filled 2014-07-23 (×2): qty 8

## 2014-07-23 MED ORDER — CHLORHEXIDINE GLUCONATE CLOTH 2 % EX PADS
6.0000 | MEDICATED_PAD | Freq: Every day | CUTANEOUS | Status: DC
  Administered 2014-07-23 – 2014-07-26 (×4): 6 via TOPICAL

## 2014-07-23 MED ORDER — MUPIROCIN 2 % EX OINT
1.0000 | TOPICAL_OINTMENT | Freq: Two times a day (BID) | CUTANEOUS | Status: DC
Start: 2014-07-23 — End: 2014-07-26
  Administered 2014-07-23 – 2014-07-26 (×7): 1 via NASAL
  Filled 2014-07-23 (×2): qty 22

## 2014-07-23 MED ORDER — HYDROCODONE-ACETAMINOPHEN 5-325 MG PO TABS
2.0000 | ORAL_TABLET | Freq: Once | ORAL | Status: AC
  Administered 2014-07-23: 2 via ORAL
  Filled 2014-07-23: qty 2

## 2014-07-23 MED ORDER — ACETAMINOPHEN 325 MG PO TABS: 650.0000 mg | ORAL_TABLET | ORAL | Status: DC | PRN

## 2014-07-23 NOTE — Progress Notes (Signed)
Patient is refusing CPAP again tonight. CPAP machine is in the room, water in the chamber. RT will continue to monitor patient for CPAP needs.

## 2014-07-23 NOTE — Progress Notes (Addendum)
Pharmacy Consult -- Prior to Admission Medications  We obtained a 3 month history of his medications from [facility] at phone number [phone] He has not filled Dilantin, Flexeril, amlodipine, gabapentin, or Imdur in the last 3 months at this pharmacy. He is not able to tell me if he gets his medications at another pharmacy.  Amlodipine and gabapentin have been stopped.  For now, we will check a Dilantin level to see if has been taking prior to admission -- it should still be low after receiving po doses of 200 mg this AM and 300 mg last PM.   7:17 pm -- Dilantin level = 2.8 -> unlikely that he was taking prior to admission  Have updated medication history to reflect this -- also stopped Dilantin for now Thank you. Anette Guarneri, PharmD  Addum:  It is also acceptable to use coreg with h/o cocaine abuse. Excell Seltzer, PharmD

## 2014-07-23 NOTE — Care Management Note (Signed)
Case Management Note  Patient Details  Name: Charles [name] MRN: [id number] Date of Birth: [birth year]  Subjective/Objective:     Pt admitted on 07/22/14 with CHF.  PTA, pt independent, lives at home.               Action/Plan: Will follow for home needs as pt progresses.  Pt with multiple readmissions; may consider HHRN for CHF follow up at discharge.    Expected Discharge Date:  07/26/14               Expected Discharge Plan:  [place]  In-House Referral:     Discharge planning Services  CM Consult  Post Acute Care Choice:    Choice offered to:     DME Arranged:    DME Agency:     HH Arranged:    HH Agency:     Status of Service:  In process, will continue to follow  Medicare Important Message Given:    Date Medicare IM Given:    Medicare IM give by:    Date Additional Medicare IM Given:    Additional Medicare Important Message give by:     If discussed at [place] of Stay Meetings, dates discussed:    Additional Comments:  Ella Bodo, RN 07/23/2014, 3:38 PM [phone]

## 2014-07-23 NOTE — Progress Notes (Signed)
PROGRESS NOTE    Charles [name] QHU:[id number] DOB: December 12, 1945 DOA: 07/22/2014 PCP: No PCP Per Patient  HPI/Brief narrative 69 year old male with history of CAD, CABG, ischemic cardiomyopathy, LVEF 40-45 percent in September 2015, most recent nuclear stress test September 2015 negative for ischemia, OSA not on CPAP, GI bleed in setting of gastric AVMs, DM, PAD, chronic systolic and diastolic CHF, polysubstance/cocaine abuse, HTN, seizure disorder, chronic kidney disease stage III, anemia, pulmonary hypertension, possible cirrhosis and multiple hospitalizations (6 in the last 6 months) presented to [facility] ED on 07/22/14 with abdominal distention and pain. CT abdomen negative for acute abdominal etiology. Patient hypoxic to 88% on room air and complained of DOE. Apparently not fully compliant with diuretic regimen at home and has gained 8-9 pounds from baseline. Recent dry weight on discharge 78 kg. Current admission weight 83 kg. Admitted for acute on chronic combined CHF.  Assessment/Plan:  Principal Problem:  Acute on chronic combined systolic and diastolic congestive heart failure/ischemic cardiomyopathy - EF 45-50%, diffuse hypokinesis, grade 2 diastolic dysfunction (ECHO 05/22/14) - Secondary to medication, dietary noncompliance and cocaine abuse - Started on IV Lasix 40 mg 3 times a day but no net negative balance per records. Will increase Lasix to 80 mg twice a day. - Despite abdominal distention, CT of the abdomen shows only a minimal amount of ascites - cont ACE I    Active Problems: Acute hypoxic respiratory failure - Due to decompensated CHF. Management as above - Titrate oxygen down as tolerated  Chronic kidney disease (CKD), stage III (moderate) -Renal function is baseline   Seizure disorder - Continue phenytoin  CAD status post CABG - Continue aspirin. - DC and avoid beta blockers secondary to persistent and ongoing cocaine abuse despite several cessation counseling  by multiple MDs over multiple hospitalizations.  Essential Hypertension - Continue home medications amlodipine, clonidine, hydralazine, lisinopril and Imdur  - Controlled and at times soft blood pressures. May consider discontinuing some of the medications if possible to make the regimen simple for him to take  Cirrhosis of the liver - Noted on CT abdomen today - Unclear etiology.  Polysubstance abuse/cocaine  - UDS has been positive on most occasions since several testing from 2012. -  cessation counseling done again.  Noncompliance with medications -Have educated him on taking his medications properly -We'll need to reinforce this education prior to his discharge  OSA - Not on CPAP  Anemia - Stable    Code Status: Full Family Communication: None at bedside Disposition Plan: DC home when medcially stable   Consultants:  None  Procedures:  None  Antibiotics:  None   Subjective: States that his abdominal distention has not significantly improved. Mild DOE. Leg swelling present. Denies chest pain.  Objective: Filed Vitals:   07/23/14 0626 07/23/14 1013 07/23/14 1350 07/23/14 1404  BP: 101/73 106/60 129/61 111/55  Pulse: 63 55 53 54  Temp: 97.7 F (36.5 C)  97.5 F (36.4 C)   TempSrc: Oral  Oral   Resp: 20  18   Height:      Weight: 80.967 kg (178 lb 8 oz)     SpO2: 94%  91%     Intake/Output Summary (Last 24 hours) at 07/23/14 1435 Last data filed at 07/23/14 1405  Gross per 24 hour  Intake   1310 ml  Output    650 ml  Net    660 ml   Filed Weights   07/22/14 1103 07/22/14 1937 07/23/14 0626  Weight: 83.037  kg (183 lb 1 oz) 82.328 kg (181 lb 8 oz) 80.967 kg (178 lb 8 oz)     Exam:  General exam: Moderately built and nourished elderly male lying comfortably propped up in bed. Respiratory system: Reduced breath sounds in the bases with few bibasal crackles. Rest of lung fields clear to auscultation. No increased work of  breathing. Cardiovascular system: S1 & S2 heard, RRR. No murmurs, gallops, clicks. JVD +. 1+ pitting bilateral leg edema. Gastrointestinal system: Abdomen is obese/? Mildly distended, soft and nontender. Normal bowel sounds heard. Central nervous system: Alert and oriented. No focal neurological deficits. Extremities: Symmetric 5 x 5 power.   Data Reviewed: Basic Metabolic Panel:  Recent Labs Lab 07/22/14 1120 07/22/14 1838 07/23/14 0348  NA 135  --  135  K 4.8  --  4.0  CL 103  --  104  CO2 20*  --  23  GLUCOSE 91  --  139*  BUN 25*  --  26*  CREATININE 1.26* 1.42* 1.43*  CALCIUM 8.7*  --  8.4*   Liver Function Tests:  Recent Labs Lab 07/22/14 1120  AST 88*  ALT 53  ALKPHOS 105  BILITOT 1.3*  PROT 8.2*  ALBUMIN 3.0*    Recent Labs Lab 07/22/14 1120  LIPASE 39   No results for input(s): AMMONIA in the last 168 hours. CBC:  Recent Labs Lab 07/22/14 1120 07/22/14 1838  WBC 5.6 5.7  HGB 8.7* 8.4*  HCT 28.2* 27.8*  MCV 83.7 84.5  PLT 322 309   Cardiac Enzymes: No results for input(s): CKTOTAL, CKMB, CKMBINDEX, TROPONINI in the last 168 hours. BNP (last 3 results)  Recent Labs  01/01/14 1107 02/07/14 0123 03/01/14 2250  PROBNP 3225.0* 2648.0* 7421.0*   CBG: No results for input(s): GLUCAP in the last 168 hours.  Recent Results (from the past 240 hour(s))  MRSA PCR Screening     Status: Abnormal   Collection Time: 07/23/14  1:29 AM  Result Value Ref Range Status   MRSA by PCR POSITIVE (A) NEGATIVE Final    Comment:        The GeneXpert MRSA Assay (FDA approved for NASAL specimens only), is one component of a comprehensive MRSA colonization surveillance program. It is not intended to diagnose MRSA infection nor to guide or monitor treatment for MRSA infections. RESULT CALLED TO, READ BACK BY AND VERIFIED WITH: D HART,RN [id number] WILDERK          Studies: Ct Abdomen Pelvis Wo Contrast  07/22/2014   CLINICAL DATA:  Abdominal pain  and lower extremity swelling for 4 days. History of cirrhosis.  EXAM: CT ABDOMEN AND PELVIS WITHOUT CONTRAST  TECHNIQUE: Multidetector CT imaging of the abdomen and pelvis was performed following the standard protocol without IV contrast.  COMPARISON:  CT angiogram chest, abdomen and pelvis 04/26/2014. CT abdomen and pelvis 04/02/2014.  FINDINGS: The lung bases show some mild atelectasis. Trace left pleural effusion is noted. There is no right effusion or pericardial effusion. There is marked cardiomegaly.  Again seen is a somewhat shrunken liver with a nodular border consistent with cirrhosis. No focal liver lesion is identified. There is a small volume of abdominal and pelvic ascites. Diffuse body wall edema is noted. The spleen, adrenal glands, pancreas and kidneys are unremarkable. Calcifications about the kidneys are likely related to vascular disease. The patient has extensive aortoiliac atherosclerosis without aneurysm.  The stomach, small and large bowel and appendix appear normal. No lymphadenopathy is seen. No focal fluid  collection is identified.  Convex left lumbar scoliosis and lower lumbar degenerative disc disease is identified. No lytic or sclerotic bony lesion is seen.  IMPRESSION: No acute abnormality abdomen or pelvis.  Cirrhotic liver with an associated small volume of abdominal and pelvic ascites. Body wall edema may be related to hepatic insufficiency.  Extensive atherosclerotic vascular disease.  Cardiomegaly.   Electronically Signed   By: Inge Rise M.D.   On: 07/22/2014 16:57   Dg Chest 2 View  07/22/2014   CLINICAL DATA:  Shortness of breath. Lower extremity edema. Congestive heart failure. Chronic kidney disease stage 3. Pulmonary hypertension.  EXAM: CHEST  2 VIEW  COMPARISON:  07/17/2014  FINDINGS: Moderate cardiomegaly and pulmonary vascular congestion are again seen. No evidence of frank pulmonary edema or focal consolidation. No evidence pleural effusion. Prior CABG noted.   IMPRESSION: Moderate cardiomegaly and chronic pulmonary venous hypertension. No acute findings.   Electronically Signed   By: Earle Gell M.D.   On: 07/22/2014 11:58   US Abdomen Limited  07/22/2014   CLINICAL DATA:  Right upper quadrant pain today.  EXAM: US ABDOMEN LIMITED - RIGHT UPPER QUADRANT  COMPARISON:  CT abdomen and pelvis 04/23/2014  FINDINGS: Gallbladder:  No gallstones or wall thickening visualized. No sonographic Murphy sign noted.  Common bile duct:  Diameter: 0.5 cm  Liver:  Nodular border is identified. There is hepatomegaly with the liver measuring 26 cm craniocaudal. No focal lesion or intrahepatic biliary ductal dilatation is identified. Normal hepatopetal flow is seen in the portal vein. Small amount of perihepatic ascites is noted.  IMPRESSION: Negative for gallstones.  Nodular border of the liver compatible with cirrhosis. Small amount of perihepatic ascites is noted.   Electronically Signed   By: Inge Rise M.D.   On: 07/22/2014 14:20        Scheduled Meds: . amLODipine  10 mg Oral Daily  . aspirin EC  81 mg Oral Daily  . carvedilol  12.5 mg Oral BID WC  . Chlorhexidine Gluconate Cloth  6 each Topical Q0600  . citalopram  20 mg Oral Daily  . cloNIDine  0.2 mg Oral BID  . divalproex  500 mg Oral BID  . ferrous sulfate  325 mg Oral TID WC  . furosemide  40 mg Intravenous 3 times per day  . gabapentin  100 mg Oral BID  . heparin  5,000 Units Subcutaneous 3 times per day  . hydrALAZINE  100 mg Oral 3 times per day  . isosorbide mononitrate  30 mg Oral Daily  . lisinopril  20 mg Oral BID  . mupirocin ointment  1 application Nasal BID  . pantoprazole  40 mg Oral Daily  . phenytoin  200 mg Oral Daily  . phenytoin  300 mg Oral QHS  . phenytoin  300 mg Oral QHS  . sodium chloride  3 mL Intravenous Q12H   Continuous Infusions:   Principal Problem:   Acute on chronic combined systolic and diastolic congestive heart failure Active Problems:   Hypertension    Chronic kidney disease (CKD), stage III (moderate)   Seizure disorder   History of GI bleed    Time spent: 45 minutes.    Vernell Leep, MD, FACP, FHM. [facility] Pager [phone]  If 7PM-7AM, please contact night-coverage [email or web address] Password [email or web address] 07/23/2014, 2:35 PM    LOS: 1 day

## 2014-07-24 DIAGNOSIS — D649 Anemia, unspecified: Secondary | ICD-10-CM

## 2014-07-24 DIAGNOSIS — F1410 Cocaine abuse, uncomplicated: Secondary | ICD-10-CM

## 2014-07-24 DIAGNOSIS — N179 Acute kidney failure, unspecified: Secondary | ICD-10-CM

## 2014-07-24 LAB — CBC
HEMATOCRIT: 25.7 % — AB (ref 39.0–52.0)
HEMOGLOBIN: 7.7 g/dL — AB (ref 13.0–17.0)
MCH: 25.2 pg — AB (ref 26.0–34.0)
MCHC: 30 g/dL (ref 30.0–36.0)
MCV: 84.3 fL (ref 78.0–100.0)
PLATELETS: 315 10*3/uL (ref 150–400)
RBC: 3.05 MIL/uL — AB (ref 4.22–5.81)
RDW: 19.3 % — ABNORMAL HIGH (ref 11.5–15.5)
WBC: 5.4 10*3/uL (ref 4.0–10.5)

## 2014-07-24 LAB — COMPREHENSIVE METABOLIC PANEL
ALBUMIN: 2.4 g/dL — AB (ref 3.5–5.0)
ALT: 37 U/L (ref 17–63)
AST: 54 U/L — ABNORMAL HIGH (ref 15–41)
Alkaline Phosphatase: 91 U/L (ref 38–126)
Anion gap: 11 (ref 5–15)
BILIRUBIN TOTAL: 0.8 mg/dL (ref 0.3–1.2)
BUN: 30 mg/dL — AB (ref 6–20)
CALCIUM: 8.1 mg/dL — AB (ref 8.9–10.3)
CHLORIDE: 104 mmol/L (ref 101–111)
CO2: 22 mmol/L (ref 22–32)
CREATININE: 1.77 mg/dL — AB (ref 0.61–1.24)
GFR calc Af Amer: 43 mL/min — ABNORMAL LOW (ref >60)
GFR calc non Af Amer: 37 mL/min — ABNORMAL LOW (ref >60)
Glucose, Bld: 123 mg/dL — ABNORMAL HIGH (ref 70–99)
Potassium: 4.3 mmol/L (ref 3.5–5.1)
Sodium: 137 mmol/L (ref 135–145)
Total Protein: 6.4 g/dL — ABNORMAL LOW (ref 6.5–8.1)

## 2014-07-24 LAB — BRAIN NATRIURETIC PEPTIDE: B Natriuretic Peptide: 1075.1 pg/mL — ABNORMAL HIGH (ref 0.0–100.0)

## 2014-07-24 MED ORDER — HYDROCODONE-ACETAMINOPHEN 5-325 MG PO TABS
1.0000 | ORAL_TABLET | Freq: Four times a day (QID) | ORAL | Status: DC | PRN
Start: 2014-07-24 — End: 2014-07-25
  Administered 2014-07-25: 1 via ORAL
  Filled 2014-07-24: qty 1

## 2014-07-24 MED ORDER — ACETAMINOPHEN 325 MG PO TABS: 650.0000 mg | ORAL_TABLET | ORAL | Status: DC | PRN

## 2014-07-24 MED ORDER — LISINOPRIL 20 MG PO TABS: 20.0000 mg | ORAL_TABLET | Freq: Two times a day (BID) | ORAL | Status: DC

## 2014-07-24 MED ORDER — FUROSEMIDE 10 MG/ML IJ SOLN
100.0000 mg | Freq: Two times a day (BID) | INTRAVENOUS | Status: DC
  Administered 2014-07-24 – 2014-07-26 (×4): 100 mg via INTRAVENOUS
  Filled 2014-07-24 (×6): qty 10

## 2014-07-24 MED ORDER — FUROSEMIDE 10 MG/ML IJ SOLN
100.0000 mg | Freq: Two times a day (BID) | INTRAMUSCULAR | Status: DC
  Administered 2014-07-24: 100 mg via INTRAVENOUS

## 2014-07-24 NOTE — Progress Notes (Signed)
CSW spoke with patient today- he is well known to this CSW from multiple hospitalizations.  Patient lives in [place] but prefers to seek medical care at [facility].  He consistently denies having any family or friends to assist him at home and maintains that he lives in a "less than acceptable" home situation. He has requested many times to seek housing in [place] but his income is not sufficient to obtain independent housing. He adamant refuses any type of Assisted Living.  Patient is also positive for cocaine on most admissions; CSW discusses with patient the relationship between substance abuse and his current medical condition .  Patient verbalizes understanding but continues to use and refuse any type of substance abuse counseling or treatment options.  At d/c- patient always states he does not have transportation back to [facility] and becomes very angry and agitated with CSW staff when not provided with a taxi voucher.  Attempts have been made to assist patient with bus transportation but he is unable to walk from the bus depot to his home.  He has been sent home via EMS but he does not meet the criteria for this type of transportation as he is able to walk short distances and is alert, oriented and self sufficient of his ADL's.  CSW spoke to patient about the need to obtain other means of transportation as we cannot continue to provide taxi vouchers to him at d/c.  Patient again became very angry with CSW and demanded the names of this CSW's supervisor and the "boss of the hospital".  Patient was provided with this information and provided support. At d/c- it is unsure if he will be able to arrange any transportation and CSW services may have to once again assist with this endeavor.  Charles [name]. Charles [name], [place]

## 2014-07-24 NOTE — Progress Notes (Signed)
Patient refused CPAP for tonight. Patient aware that if he changed his mind to call RT.

## 2014-07-24 NOTE — Progress Notes (Signed)
Approached pt to place SCDs and he refused.  Educated him on the importance of guarding against VTE, since he continues to refuse heparin, as well.

## 2014-07-24 NOTE — Progress Notes (Signed)
PROGRESS NOTE    Charles [name] NWG:[id number] DOB: April 17, 1945 DOA: 07/22/2014 PCP: No PCP Per Patient  HPI/Brief narrative 69 year old male with history of CAD, CABG, ischemic cardiomyopathy, LVEF 40-45 percent in September 2015, most recent nuclear stress test September 2015 negative for ischemia, OSA not on CPAP, GI bleed in setting of gastric AVMs, DM, PAD, chronic systolic and diastolic CHF, polysubstance/cocaine abuse, HTN, seizure disorder, chronic kidney disease stage III, anemia, pulmonary hypertension, possible cirrhosis and multiple hospitalizations (6 in the last 6 months) presented to [facility] ED on 07/22/14 with abdominal distention and pain. CT abdomen negative for acute abdominal etiology. Patient hypoxic to 88% on room air and complained of DOE. Apparently not fully compliant with diuretic regimen at home and has gained 8-9 pounds from baseline. Recent dry weight on discharge 78 kg. Current admission weight 83 kg. Admitted for acute on chronic combined CHF.  Assessment/Plan:  Principal Problem:  Acute on chronic combined systolic and diastolic congestive heart failure/ischemic cardiomyopathy - EF 45-50%, diffuse hypokinesis, grade 2 diastolic dysfunction (ECHO 05/22/14) - Secondary to medication & dietary noncompliance and cocaine abuse - No adequate response to reduced dose of IV Lasix. Increased Lasix to 100 MG twice a day on 5/6 - Despite abdominal distention, CT of the abdomen shows only a minimal amount of ascites - ACE I held d/t bump in creatinine  Active Problems: Acute hypoxic respiratory failure - Due to decompensated CHF. Management as above - Titrate oxygen down as tolerated  Acute on Chronic kidney disease (CKD), stage III (moderate) - Creatinine increased from 1.4>1.7 on 5/6 - Held Lisinopril. Follow BMP in am   Seizure disorder - Patient on polypharmacy: requested Pharmacy to reconcile home medications on 5/5 (please see note by them on 5/5): per OP  pharmacy has not filled several of meds that were listed on med rec, in 3 months. Dilantin level low> hence unlikely on it.  - DC'ed Dilantin - Monitor - States that he last had a seizure 3 years ago.  CAD status post CABG - Continue aspirin. - DC and avoid beta blockers secondary to persistent and ongoing cocaine abuse despite several cessation counseling by multiple MDs over multiple hospitalizations.  Essential Hypertension - Continue home medications clonidine, hydralazine and Imdur  - Controlled and at times soft blood pressures. May consider discontinuing some of the medications if possible to make the regimen simple for him to take - DC'ed Amlodipine- not on at home - Held ACEI d/t AKI  Cirrhosis of the liver - Noted on CT abdomen today - Unclear etiology.  Polysubstance abuse/cocaine  - UDS has been positive on most occasions since several testing from 2012. -  cessation counseling done again. - States that he last used cocaine on 07/19/14  Noncompliance with medications -Have educated him on taking his medications properly -We'll need to reinforce this education prior to his discharge  OSA - Not on CPAP  Anemia - Hemoglobin has dropped from 7.7-8.4. Prior history of GI bleed. - PPI - Check FOBT - DC heparin DVT prophylaxis - Transfuse if hemoglobin less than 7 g per DL.    Code Status: Full Family Communication: None at bedside Disposition Plan: DC home when medcially stable   Consultants:  None  Procedures:  None  Antibiotics:  None   Subjective: Dyspnea only slightly better. States that he's not making much urine. Denies chest pain. Last seizure approximately 3 years ago.  Objective: Filed Vitals:   07/24/14 0710 07/24/14 1122 07/24/14 1300 07/24/14  1508  BP:  142/50 128/68 124/66  Pulse:  69 55 60  Temp:    98 F (36.7 C)  TempSrc:    Oral  Resp:    20  Height:      Weight: 71.895 kg (158 lb 8 oz)     SpO2:  100%  100%     Intake/Output Summary (Last 24 hours) at 07/24/14 1608 Last data filed at 07/24/14 1505  Gross per 24 hour  Intake   1941 ml  Output   1675 ml  Net    266 ml   Filed Weights   07/22/14 1937 07/23/14 0626 07/24/14 0710  Weight: 82.328 kg (181 lb 8 oz) 80.967 kg (178 lb 8 oz) 71.895 kg (158 lb 8 oz)     Exam:  General exam: Moderately built and nourished elderly male lying comfortably propped up in bed. Respiratory system: Reduced breath sounds in the bases with few bibasal crackles. Rest of lung fields clear to auscultation. No increased work of breathing. Cardiovascular system: S1 & S2 heard, RRR. No murmurs, gallops, clicks. JVD +. trace pitting bilateral leg edema. Telemetry: Sinus bradycardia in the 50s-sinus rhythm in the 60s. Gastrointestinal system: Abdomen is obese/? Mildly distended, soft and nontender. Normal bowel sounds heard. Central nervous system: Alert and oriented. No focal neurological deficits. Extremities: Symmetric 5 x 5 power.   Data Reviewed: Basic Metabolic Panel:  Recent Labs Lab 07/22/14 1120 07/22/14 1838 07/23/14 0348 07/24/14 0338  NA 135  --  135 137  K 4.8  --  4.0 4.3  CL 103  --  104 104  CO2 20*  --  23 22  GLUCOSE 91  --  139* 123*  BUN 25*  --  26* 30*  CREATININE 1.26* 1.42* 1.43* 1.77*  CALCIUM 8.7*  --  8.4* 8.1*   Liver Function Tests:  Recent Labs Lab 07/22/14 1120 07/24/14 0338  AST 88* 54*  ALT 53 37  ALKPHOS 105 91  BILITOT 1.3* 0.8  PROT 8.2* 6.4*  ALBUMIN 3.0* 2.4*    Recent Labs Lab 07/22/14 1120  LIPASE 39   No results for input(s): AMMONIA in the last 168 hours. CBC:  Recent Labs Lab 07/22/14 1120 07/22/14 1838 07/24/14 0338  WBC 5.6 5.7 5.4  HGB 8.7* 8.4* 7.7*  HCT 28.2* 27.8* 25.7*  MCV 83.7 84.5 84.3  PLT 322 309 315   Cardiac Enzymes: No results for input(s): CKTOTAL, CKMB, CKMBINDEX, TROPONINI in the last 168 hours. BNP (last 3 results)  Recent Labs  01/01/14 1107 02/07/14 0123  03/01/14 2250  PROBNP 3225.0* 2648.0* 7421.0*   CBG: No results for input(s): GLUCAP in the last 168 hours.  Recent Results (from the past 240 hour(s))  MRSA PCR Screening     Status: Abnormal   Collection Time: 07/23/14  1:29 AM  Result Value Ref Range Status   MRSA by PCR POSITIVE (A) NEGATIVE Final    Comment:        The GeneXpert MRSA Assay (FDA approved for NASAL specimens only), is one component of a comprehensive MRSA colonization surveillance program. It is not intended to diagnose MRSA infection nor to guide or monitor treatment for MRSA infections. RESULT CALLED TO, READ BACK BY AND VERIFIED WITH: D HART,RN [id number] WILDERK          Studies: Ct Abdomen Pelvis Wo Contrast  07/22/2014   CLINICAL DATA:  Abdominal pain and lower extremity swelling for 4 days. History of cirrhosis.  EXAM: CT  ABDOMEN AND PELVIS WITHOUT CONTRAST  TECHNIQUE: Multidetector CT imaging of the abdomen and pelvis was performed following the standard protocol without IV contrast.  COMPARISON:  CT angiogram chest, abdomen and pelvis 04/26/2014. CT abdomen and pelvis 04/02/2014.  FINDINGS: The lung bases show some mild atelectasis. Trace left pleural effusion is noted. There is no right effusion or pericardial effusion. There is marked cardiomegaly.  Again seen is a somewhat shrunken liver with a nodular border consistent with cirrhosis. No focal liver lesion is identified. There is a small volume of abdominal and pelvic ascites. Diffuse body wall edema is noted. The spleen, adrenal glands, pancreas and kidneys are unremarkable. Calcifications about the kidneys are likely related to vascular disease. The patient has extensive aortoiliac atherosclerosis without aneurysm.  The stomach, small and large bowel and appendix appear normal. No lymphadenopathy is seen. No focal fluid collection is identified.  Convex left lumbar scoliosis and lower lumbar degenerative disc disease is identified. No lytic or  sclerotic bony lesion is seen.  IMPRESSION: No acute abnormality abdomen or pelvis.  Cirrhotic liver with an associated small volume of abdominal and pelvic ascites. Body wall edema may be related to hepatic insufficiency.  Extensive atherosclerotic vascular disease.  Cardiomegaly.   Electronically Signed   By: Inge Rise M.D.   On: 07/22/2014 16:57        Scheduled Meds: . aspirin EC  81 mg Oral Daily  . Chlorhexidine Gluconate Cloth  6 each Topical Q0600  . citalopram  20 mg Oral Daily  . cloNIDine  0.2 mg Oral BID  . divalproex  500 mg Oral BID  . ferrous sulfate  325 mg Oral TID WC  . furosemide  100 mg Intravenous BID  . heparin  5,000 Units Subcutaneous 3 times per day  . hydrALAZINE  100 mg Oral 3 times per day  . isosorbide mononitrate  30 mg Oral Daily  . mupirocin ointment  1 application Nasal BID  . pantoprazole  40 mg Oral Daily  . sodium chloride  3 mL Intravenous Q12H   Continuous Infusions:   Principal Problem:   Acute on chronic combined systolic and diastolic congestive heart failure Active Problems:   Hypertension   Chronic kidney disease (CKD), stage III (moderate)   Seizure disorder   History of GI bleed    Time spent: 45 minutes.    Vernell Leep, MD, FACP, FHM. [facility] Pager [phone]  If 7PM-7AM, please contact night-coverage [email or web address] Password [email or web address] 07/24/2014, 4:08 PM    LOS: 2 days

## 2014-07-25 LAB — BASIC METABOLIC PANEL
Anion gap: 8 (ref 5–15)
BUN: 26 mg/dL — ABNORMAL HIGH (ref 6–20)
CALCIUM: 8.3 mg/dL — AB (ref 8.9–10.3)
CO2: 27 mmol/L (ref 22–32)
Chloride: 102 mmol/L (ref 101–111)
Creatinine, Ser: 1.29 mg/dL — ABNORMAL HIGH (ref 0.61–1.24)
GFR calc non Af Amer: 55 mL/min — ABNORMAL LOW (ref >60)
Glucose, Bld: 94 mg/dL (ref 70–99)
POTASSIUM: 4.1 mmol/L (ref 3.5–5.1)
Sodium: 137 mmol/L (ref 135–145)

## 2014-07-25 LAB — CBC
HEMATOCRIT: 29.1 % — AB (ref 39.0–52.0)
Hemoglobin: 8.8 g/dL — ABNORMAL LOW (ref 13.0–17.0)
MCH: 26 pg (ref 26.0–34.0)
MCHC: 30.2 g/dL (ref 30.0–36.0)
MCV: 86.1 fL (ref 78.0–100.0)
Platelets: 331 10*3/uL (ref 150–400)
RBC: 3.38 MIL/uL — AB (ref 4.22–5.81)
RDW: 19.2 % — ABNORMAL HIGH (ref 11.5–15.5)
WBC: 6.2 10*3/uL (ref 4.0–10.5)

## 2014-07-25 MED ORDER — ISOSORBIDE MONONITRATE ER 60 MG PO TB24
60.0000 mg | ORAL_TABLET | Freq: Every day | ORAL | Status: DC
  Administered 2014-07-26: 60 mg via ORAL
  Filled 2014-07-25: qty 1

## 2014-07-25 MED ORDER — HYDROCODONE-ACETAMINOPHEN 5-325 MG PO TABS
2.0000 | ORAL_TABLET | Freq: Once | ORAL | Status: AC
  Administered 2014-07-25: 2 via ORAL
  Filled 2014-07-25: qty 2

## 2014-07-25 MED ORDER — HYDROCODONE-ACETAMINOPHEN 5-325 MG PO TABS
1.0000 | ORAL_TABLET | ORAL | Status: DC | PRN
  Administered 2014-07-26: 1 via ORAL
  Filled 2014-07-25: qty 1

## 2014-07-25 MED ORDER — TRAMADOL HCL 50 MG PO TABS: 50.0000 mg | ORAL_TABLET | Freq: Four times a day (QID) | ORAL | Status: DC | PRN

## 2014-07-25 NOTE — Progress Notes (Signed)
Pt refusing cpap for tonight as well

## 2014-07-25 NOTE — Plan of Care (Signed)
Problem: Phase I Progression Outcomes Goal: EF % per last Echo/documented,Core Reminder form on chart Outcome: Completed/Met Date Met:  07/25/14 EF 45-50%(05-22-14)

## 2014-07-25 NOTE — Progress Notes (Signed)
PROGRESS NOTE    Charles [name] NFA:[id number] DOB: 07-09-1945 DOA: 07/22/2014 PCP: No PCP Per Patient  HPI/Brief narrative 69 year old male with history of CAD, CABG, ischemic cardiomyopathy, LVEF 40-45 percent in September 2015, most recent nuclear stress test September 2015 negative for ischemia, OSA not on CPAP, GI bleed in setting of gastric AVMs, DM, PAD, chronic systolic and diastolic CHF, polysubstance/cocaine abuse, HTN, seizure disorder, chronic kidney disease stage III, anemia, pulmonary hypertension, possible cirrhosis and multiple hospitalizations (6 in the last 6 months) presented to [facility] ED on 07/22/14 with abdominal distention and pain. CT abdomen negative for acute abdominal etiology. Patient hypoxic to 88% on room air and complained of DOE. Apparently not fully compliant with diuretic regimen at home and has gained 8-9 pounds from baseline. Recent dry weight on discharge 78 kg. Current admission weight 83 kg. Admitted for acute on chronic combined CHF.  Assessment/Plan:  Principal Problem:  Acute on chronic combined systolic and diastolic congestive heart failure/ischemic cardiomyopathy - EF 45-50%, diffuse hypokinesis, grade 2 diastolic dysfunction (ECHO 05/22/14) - Secondary to medication & dietary noncompliance and cocaine abuse - No adequate response to reduced dose of IV Lasix. Increased Lasix to 100 MG twice a day on 5/6 - Improving. -3.372 ML since admission. - Despite abdominal distention, CT of the abdomen shows only a minimal amount of ascites - ACE I held d/t bump in creatinine  Active Problems: Acute hypoxic respiratory failure - Due to decompensated CHF. Management as above - Titrate oxygen down as tolerated  Acute on Chronic kidney disease (CKD), stage III (moderate) - Creatinine increased from 1.4>1.7 on 5/6 - Held Lisinopril.  - Creatinine slightly better 5/7.   Seizure disorder - Patient on polypharmacy: requested Pharmacy to reconcile home  medications on 5/5 (please see note by them on 5/5): per OP pharmacy has not filled several of meds that were listed on med rec, in 3 months. Dilantin level low> hence unlikely on it.  - DC'ed Dilantin - Monitor - States that he last had a seizure 3 years ago.  CAD status post CABG - Continue aspirin. - DC and avoid beta blockers secondary to persistent and ongoing cocaine abuse despite several cessation counseling by multiple MDs over multiple hospitalizations.  Essential Hypertension - Continue home medications clonidine, hydralazine and Imdur  - Controlled and at times soft blood pressures. May consider discontinuing some of the medications if possible to make the regimen simple for him to take - DC'ed Amlodipine- not on at home - Held ACEI d/t AKI >we'll resume if creatinine remains stable.  Cirrhosis of the liver - Noted on CT abdomen today - Unclear etiology.  Polysubstance abuse/cocaine  - UDS has been positive on most occasions since several testing from 2012. -  cessation counseling done again. - States that he last used cocaine on 07/19/14  Noncompliance with medications -Have educated him on taking his medications properly -We'll need to reinforce this education prior to his discharge  OSA - Not on CPAP  Anemia - Prior history of GI bleed. - PPI - Check FOBT - DC heparin DVT prophylaxis - Transfuse if hemoglobin less than 7 g per DL. - Hemoglobin has remained stable in the 8 g per DL range.    Code Status: Full Family Communication: None at bedside Disposition Plan: DC home when medcially stable, possibly in the next 1-2 days.   Consultants:  None  Procedures:  None  Antibiotics:  None   Subjective: Dyspnea improved. Chronic leg pain.  Objective: Filed Vitals:   07/24/14 1508 07/24/14 2059 07/25/14 0607 07/25/14 0944  BP: 124/66 160/84 170/70 160/90  Pulse: 60 64 65 73  Temp: 98 F (36.7 C) 97.7 F (36.5 C) 97.8 F (36.6 C)   TempSrc:  Oral Oral Oral   Resp: 20 20 18 18   Height:      Weight:   79.289 kg (174 lb 12.8 oz)   SpO2: 100% 97% 95% 97%    Intake/Output Summary (Last 24 hours) at 07/25/14 1435 Last data filed at 07/25/14 1300  Gross per 24 hour  Intake 1898.58 ml  Output   5975 ml  Net -4076.42 ml   Filed Weights   07/23/14 0626 07/24/14 0710 07/25/14 0607  Weight: 80.967 kg (178 lb 8 oz) 71.895 kg (158 lb 8 oz) 79.289 kg (174 lb 12.8 oz)     Exam:  General exam: Moderately built and nourished elderly male sitting up comfortably in bed. Respiratory system: clear to auscultation. No increased work of breathing. Cardiovascular system: S1 & S2 heard, RRR. No murmurs, gallops, clicks. JVD +. trace pitting bilateral leg edema. Telemetry: Mostly sinus rhythm in the 60s. Occasional sinus bradycardia in the 50s. Gastrointestinal system: Abdomen is obese, soft and nontender. Normal bowel sounds heard. Central nervous system: Alert and oriented. No focal neurological deficits. Extremities: Symmetric 5 x 5 power.   Data Reviewed: Basic Metabolic Panel:  Recent Labs Lab 07/22/14 1120 07/22/14 1838 07/23/14 0348 07/24/14 0338 07/25/14 0454  NA 135  --  135 137 137  K 4.8  --  4.0 4.3 4.1  CL 103  --  104 104 102  CO2 20*  --  23 22 27   GLUCOSE 91  --  139* 123* 94  BUN 25*  --  26* 30* 26*  CREATININE 1.26* 1.42* 1.43* 1.77* 1.29*  CALCIUM 8.7*  --  8.4* 8.1* 8.3*   Liver Function Tests:  Recent Labs Lab 07/22/14 1120 07/24/14 0338  AST 88* 54*  ALT 53 37  ALKPHOS 105 91  BILITOT 1.3* 0.8  PROT 8.2* 6.4*  ALBUMIN 3.0* 2.4*    Recent Labs Lab 07/22/14 1120  LIPASE 39   No results for input(s): AMMONIA in the last 168 hours. CBC:  Recent Labs Lab 07/22/14 1120 07/22/14 1838 07/24/14 0338 07/25/14 0454  WBC 5.6 5.7 5.4 6.2  HGB 8.7* 8.4* 7.7* 8.8*  HCT 28.2* 27.8* 25.7* 29.1*  MCV 83.7 84.5 84.3 86.1  PLT 322 309 315 331   Cardiac Enzymes: No results for input(s): CKTOTAL,  CKMB, CKMBINDEX, TROPONINI in the last 168 hours. BNP (last 3 results)  Recent Labs  01/01/14 1107 02/07/14 0123 03/01/14 2250  PROBNP 3225.0* 2648.0* 7421.0*   CBG: No results for input(s): GLUCAP in the last 168 hours.  Recent Results (from the past 240 hour(s))  MRSA PCR Screening     Status: Abnormal   Collection Time: 07/23/14  1:29 AM  Result Value Ref Range Status   MRSA by PCR POSITIVE (A) NEGATIVE Final    Comment:        The GeneXpert MRSA Assay (FDA approved for NASAL specimens only), is one component of a comprehensive MRSA colonization surveillance program. It is not intended to diagnose MRSA infection nor to guide or monitor treatment for MRSA infections. RESULT CALLED TO, READ BACK BY AND VERIFIED WITH: D HART,RN [phone] WIL[facility]K          Studies: No results found.      Scheduled Meds: .  aspirin EC  81 mg Oral Daily  . Chlorhexidine Gluconate Cloth  6 each Topical Q0600  . citalopram  20 mg Oral Daily  . cloNIDine  0.2 mg Oral BID  . divalproex  500 mg Oral BID  . ferrous sulfate  325 mg Oral TID WC  . furosemide  100 mg Intravenous BID  . hydrALAZINE  100 mg Oral 3 times per day  . isosorbide mononitrate  30 mg Oral Daily  . mupirocin ointment  1 application Nasal BID  . pantoprazole  40 mg Oral Daily  . sodium chloride  3 mL Intravenous Q12H   Continuous Infusions:   Principal Problem:   Acute on chronic combined systolic and diastolic congestive heart failure Active Problems:   Hypertension   Chronic kidney disease (CKD), stage III (moderate)   Seizure disorder   History of GI bleed    Time spent: 25 minutes.    Vernell Leep, MD, FACP, FHM. [facility] Pager [phone]  If 7PM-7AM, please contact night-coverage [email or web address] Password [email or web address] 07/25/2014, 2:35 PM    LOS: 3 days

## 2014-07-26 LAB — BASIC METABOLIC PANEL
Anion gap: 10 (ref 5–15)
BUN: 25 mg/dL — ABNORMAL HIGH (ref 6–20)
CALCIUM: 8.5 mg/dL — AB (ref 8.9–10.3)
CO2: 29 mmol/L (ref 22–32)
CREATININE: 1.33 mg/dL — AB (ref 0.61–1.24)
Chloride: 97 mmol/L — ABNORMAL LOW (ref 101–111)
GFR calc Af Amer: >60 mL/min (ref >60)
GFR, EST NON AFRICAN AMERICAN: 53 mL/min — AB (ref >60)
GLUCOSE: 115 mg/dL — AB (ref 70–99)
Potassium: 3.5 mmol/L (ref 3.5–5.1)
SODIUM: 136 mmol/L (ref 135–145)

## 2014-07-26 MED ORDER — FUROSEMIDE 40 MG PO TABS: 40.0000 mg | ORAL_TABLET | Freq: Two times a day (BID) | ORAL | Status: DC

## 2014-07-26 MED ORDER — CLONIDINE HCL 0.2 MG PO TABS: 0.2000 mg | ORAL_TABLET | Freq: Two times a day (BID) | ORAL | Status: DC

## 2014-07-26 MED ORDER — ISOSORBIDE MONONITRATE ER 30 MG PO TB24: 60.0000 mg | ORAL_TABLET | Freq: Every day | ORAL | Status: AC

## 2014-07-26 MED ORDER — NITROGLYCERIN 0.4 MG SL SUBL: 0.4000 mg | SUBLINGUAL_TABLET | SUBLINGUAL | Status: AC | PRN

## 2014-07-26 MED ORDER — LISINOPRIL 20 MG PO TABS: 10.0000 mg | ORAL_TABLET | Freq: Two times a day (BID) | ORAL | Status: DC

## 2014-07-26 NOTE — Discharge Summary (Signed)
Physician Discharge Summary  Charles [name] NOM:[id number] DOB: [birth year] DOA: 07/22/2014  PCP: Patient states that his PCP recently retired a week ago and he has been given names of 2 MDs who he can choose from for PCP follow-up. Primary Cardiologist: Dr. Rozann Lesches in [place] date: 07/22/2014 Discharge date: 07/26/2014  Time spent: Greater than 30 minutes  Recommendations for Outpatient Follow-up:  1. PCP of choice, in 3 days with repeat labs (CBC & BMP). 2. Dr. Rozann Lesches, Cardiology in 5 days.  Discharge Diagnoses:  Principal Problem:   Acute on chronic combined systolic and diastolic congestive heart failure Active Problems:   Hypertension   Chronic kidney disease (CKD), stage III (moderate)   Seizure disorder   History of GI bleed   Discharge Condition: Improved & Stable  Diet recommendation: Heart healthy diet.  Filed Weights   07/25/14 0607 07/26/14 0548 07/26/14 0555  Weight: 79.289 kg (174 lb 12.8 oz) 65.454 kg (144 lb 4.8 oz) 73.8 kg (162 lb 11.2 oz)    History of present illness:  69 year old male with history of CAD, CABG, ischemic cardiomyopathy, LVEF 40-45 percent in September 2015, most recent nuclear stress test September 2015 negative for ischemia, OSA not on CPAP, GI bleed in setting of gastric AVMs, DM, PAD, chronic systolic and diastolic CHF, polysubstance/cocaine abuse, HTN, seizure disorder, chronic kidney disease stage III, anemia, pulmonary hypertension, possible cirrhosis and multiple hospitalizations (6 in the last 6 months) presented to [facility] ED on 07/22/14 with abdominal distention and pain. CT abdomen negative for acute abdominal etiology. Patient hypoxic to 88% on room air and complained of DOE. Apparently not fully compliant with diuretic regimen at home and has gained 8-9 pounds from baseline. Recent dry weight on discharge 78 kg. Current admission weight 83 kg. Admitted for acute on chronic combined CHF.  Hospital Course:   Principal  Problem:  Acute on chronic combined systolic and diastolic congestive heart failure/ischemic cardiomyopathy - EF 45-50%, diffuse hypokinesis, grade 2 diastolic dysfunction (ECHO 05/22/14) - Secondary to medication & dietary noncompliance and ongoing cocaine abuse - Diuresed with high-dose IV Lasix. - -6.549 L since admission. - Despite abdominal distention, CT of the abdomen shows only a minimal amount of ascites - Clinically improved. - Weight down from 83 KG on admission to 73.8 KG today. - We will discharge on Lasix 40 mg twice a day. ACEI which had been held secondary to worsening creatinine will be resumed at reduced dose with close follow-up of BNP as outpatient. Patient counseled extensively regarding importance of compliance with medications, diet/salt and excess water restriction, daily weights and M.D. follow-ups. He verbalized understanding. He has also been counseled extensively regarding cocaine cessation.  Active Problems: Acute hypoxic respiratory failure - Due to decompensated CHF. Management as above - Resolved  Acute on Chronic kidney disease (CKD), stage III (moderate) - Creatinine increased from 1.4>1.7 on 5/6 - Held Lisinopril.  - Creatinine improved and back to baseline. We'll resume lisinopril at half the prior home dose. Close outpatient follow-up of BMP.   Seizure disorder - Patient on polypharmacy: requested Pharmacy to reconcile home medications on 5/5 (please see note by them on 5/5): per OP pharmacy has not filled several of meds that were listed on med rec, in 3 months. Dilantin level low> hence unlikely on it.  - DC'ed Dilantin - Monitor - States that he last had a seizure 3 years ago. - As per patient and his daughter, patient does not drive.  CAD  status post CABG - Continue aspirin. - DC and avoid beta blockers secondary to persistent and ongoing cocaine abuse despite several cessation counseling by multiple MDs over multiple  hospitalizations.  Essential Hypertension - Continue home medications clonidine, hydralazine and Imdur (dose increased from 30 to 60 MG daily) - DC'ed Amlodipine- not on at home - Held ACEI d/t AKI > we will resume at half the prior home dose. - Monitor as outpatient. - Antihypertensives adjusted as above d/t slightly elevated BP's.  Cirrhosis of the liver - Noted on CT abdomen today - Unclear etiology.  Polysubstance abuse/cocaine  - UDS has been positive on most occasions since several testing from 2012. - cessation counseling done again. - States that he last used cocaine on 07/19/14  Noncompliance with medications -Have educated him on taking his medications properly  OSA - Not on CPAP  Anemia - Prior history of GI bleed. - PPI - Hemoglobin has remained stable in the 8 g per DL range. - Outpatient follow-up with CBC.  Consultants:  None  Procedures:  None   Discharge Exam:  Complaints: States that he feels much better and back to baseline. Has denied chest pain for a couple of days. Dyspnea resolved.  Filed Vitals:   07/25/14 2025 07/26/14 0548 07/26/14 0555 07/26/14 1044  BP: 177/82 166/61  153/73  Pulse: 72 63  78  Temp:  98.2 F (36.8 C)  97.9 F (36.6 C)  TempSrc: Oral Oral  Oral  Resp: 18 18  18   Height:      Weight:  65.454 kg (144 lb 4.8 oz) 73.8 kg (162 lb 11.2 oz)   SpO2: 95% 94%  95%    General exam: Moderately built and nourished elderly male sitting up comfortably in bed. Respiratory system: clear to auscultation. No increased work of breathing. Cardiovascular system: S1 & S2 heard, RRR. No murmurs, gallops, clicks. No JVD. Trace bilateral ankle edema.  Gastrointestinal system: Abdomen is obese, soft and nontender. Normal bowel sounds heard. Central nervous system: Alert and oriented. No focal neurological deficits. Extremities: Symmetric 5 x 5 power.  Discharge Instructions      Discharge Instructions    (HEART FAILURE PATIENTS)  Call MD:  Anytime you have any of the following symptoms: 1) 3 pound weight gain in 24 hours or 5 pounds in 1 week 2) shortness of breath, with or without a dry hacking cough 3) swelling in the hands, feet or stomach 4) if you have to sleep on extra pillows at night in order to breathe.    Complete by:  As directed      Call MD for:  difficulty breathing, headache or visual disturbances    Complete by:  As directed      Call MD for:  extreme fatigue    Complete by:  As directed      Call MD for:  persistant dizziness or light-headedness    Complete by:  As directed      Call MD for:  severe uncontrolled pain    Complete by:  As directed      Diet - low sodium heart healthy    Complete by:  As directed      Increase activity slowly    Complete by:  As directed             Medication List    STOP taking these medications        carvedilol 12.5 MG tablet  Commonly known as:  COREG  TAKE these medications        albuterol 108 (90 BASE) MCG/ACT inhaler  Commonly known as:  PROVENTIL HFA;VENTOLIN HFA  Inhale 2 puffs into the lungs every 6 (six) hours as needed for wheezing or shortness of breath.     aspirin 81 MG tablet  Take 81 mg by mouth daily.     citalopram 20 MG tablet  Commonly known as:  CELEXA  Take 20 mg by mouth daily.     cloNIDine 0.2 MG tablet  Commonly known as:  CATAPRES  Take 1 tablet (0.2 mg total) by mouth 2 (two) times daily.     cyclobenzaprine 5 MG tablet  Commonly known as:  FLEXERIL  Take 1 tablet (5 mg total) by mouth 3 (three) times daily as needed for muscle spasms (chest and leg pains).     divalproex 500 MG 24 hr tablet  Commonly known as:  DEPAKOTE ER  Take 500 mg by mouth 2 (two) times daily.     ferrous sulfate 325 (65 FE) MG tablet  Take 325 mg by mouth 3 (three) times daily with meals.     furosemide 40 MG tablet  Commonly known as:  LASIX  Take 1 tablet (40 mg total) by mouth 2 (two) times daily.     hydrALAZINE 100 MG tablet   Commonly known as:  APRESOLINE  Take 100 mg by mouth 3 (three) times daily.     isosorbide mononitrate 30 MG 24 hr tablet  Commonly known as:  IMDUR  Take 2 tablets (60 mg total) by mouth daily.     lisinopril 20 MG tablet  Commonly known as:  PRINIVIL,ZESTRIL  Take 0.5 tablets (10 mg total) by mouth 2 (two) times daily.     nitroGLYCERIN 0.4 MG SL tablet  Commonly known as:  NITROSTAT  Place 1 tablet (0.4 mg total) under the tongue every 5 (five) minutes as needed for chest pain (upto max 3 doses at one time.).     pantoprazole 40 MG tablet  Commonly known as:  PROTONIX  Take 40 mg by mouth daily.       Follow-up Information    Follow up with Rozann Lesches, MD. Schedule an appointment as soon as possible for a visit in 5 days.   Specialty:  Cardiology   Why:  Follow up with repeat labs (CBC & BMP) to be done either through Primary Doctor or Heart doctors office.   Contact information:   [address] [place] [address] [phone]       Follow up with Family Doctor. Schedule an appointment as soon as possible for a visit in 3 days.   Why:  To be seen with repeat labs (CBC & BMP).       The results of significant diagnostics from this hospitalization (including imaging, microbiology, ancillary and laboratory) are listed below for reference.    Significant Diagnostic Studies: Ct Abdomen Pelvis Wo Contrast  07/22/2014   CLINICAL DATA:  Abdominal pain and lower extremity swelling for 4 days. History of cirrhosis.  EXAM: CT ABDOMEN AND PELVIS WITHOUT CONTRAST  TECHNIQUE: Multidetector CT imaging of the abdomen and pelvis was performed following the standard protocol without IV contrast.  COMPARISON:  CT angiogram chest, abdomen and pelvis 04/26/2014. CT abdomen and pelvis 04/02/2014.  FINDINGS: The lung bases show some mild atelectasis. Trace left pleural effusion is noted. There is no right effusion or pericardial effusion. There is marked cardiomegaly.  Again seen is a  somewhat  shrunken liver with a nodular border consistent with cirrhosis. No focal liver lesion is identified. There is a small volume of abdominal and pelvic ascites. Diffuse body wall edema is noted. The spleen, adrenal glands, pancreas and kidneys are unremarkable. Calcifications about the kidneys are likely related to vascular disease. The patient has extensive aortoiliac atherosclerosis without aneurysm.  The stomach, small and large bowel and appendix appear normal. No lymphadenopathy is seen. No focal fluid collection is identified.  Convex left lumbar scoliosis and lower lumbar degenerative disc disease is identified. No lytic or sclerotic bony lesion is seen.  IMPRESSION: No acute abnormality abdomen or pelvis.  Cirrhotic liver with an associated small volume of abdominal and pelvic ascites. Body wall edema may be related to hepatic insufficiency.  Extensive atherosclerotic vascular disease.  Cardiomegaly.   Electronically Signed   By: Inge Rise M.D.   On: 07/22/2014 16:57   Dg Chest 2 View  07/22/2014   CLINICAL DATA:  Shortness of breath. Lower extremity edema. Congestive heart failure. Chronic kidney disease stage 3. Pulmonary hypertension.  EXAM: CHEST  2 VIEW  COMPARISON:  07/17/2014  FINDINGS: Moderate cardiomegaly and pulmonary vascular congestion are again seen. No evidence of frank pulmonary edema or focal consolidation. No evidence pleural effusion. Prior CABG noted.  IMPRESSION: Moderate cardiomegaly and chronic pulmonary venous hypertension. No acute findings.   Electronically Signed   By: Earle Gell M.D.   On: 07/22/2014 11:58   Dg Chest 2 View  07/03/2014   CLINICAL DATA:  Chest pain and shortness of breath  EXAM: CHEST  2 VIEW  COMPARISON:  06/06/2014  FINDINGS: Chronic cardiomegaly. There is pulmonary venous congestion with cephalized blood flow and interstitial coarsening. The patient is status post CABG. Aortic and hilar contours are stable. No effusion or pneumonia.   IMPRESSION: Mild CHF.   Electronically Signed   By: Monte Fantasia M.D.   On: 07/03/2014 02:43   US Abdomen Limited  07/22/2014   CLINICAL DATA:  Right upper quadrant pain today.  EXAM: US ABDOMEN LIMITED - RIGHT UPPER QUADRANT  COMPARISON:  CT abdomen and pelvis 04/23/2014  FINDINGS: Gallbladder:  No gallstones or wall thickening visualized. No sonographic Murphy sign noted.  Common bile duct:  Diameter: 0.5 cm  Liver:  Nodular border is identified. There is hepatomegaly with the liver measuring 26 cm craniocaudal. No focal lesion or intrahepatic biliary ductal dilatation is identified. Normal hepatopetal flow is seen in the portal vein. Small amount of perihepatic ascites is noted.  IMPRESSION: Negative for gallstones.  Nodular border of the liver compatible with cirrhosis. Small amount of perihepatic ascites is noted.   Electronically Signed   By: Inge Rise M.D.   On: 07/22/2014 14:20   Dg Abd Portable 1v  07/03/2014   CLINICAL DATA:  Constipation  EXAM: PORTABLE ABDOMEN - 1 VIEW  COMPARISON:  CT abdomen and pelvis April 26, 2014  FINDINGS: There is moderate stool throughout the colon. There is no bowel dilatation or air-fluid level suggesting obstruction. No free air. There is vascular calcification in the aorta and major pelvic arterial vessels.  IMPRESSION: Moderate stool in colon. Bowel gas pattern unremarkable. Extensive atherosclerotic calcification.   Electronically Signed   By: Lowella Grip III M.D.   On: 07/03/2014 16:08    Microbiology: Recent Results (from the past 240 hour(s))  MRSA PCR Screening     Status: Abnormal   Collection Time: 07/23/14  1:29 AM  Result Value Ref Range Status   MRSA by  PCR POSITIVE (A) NEGATIVE Final    Comment:        The GeneXpert MRSA Assay (FDA approved for NASAL specimens only), is one component of a comprehensive MRSA colonization surveillance program. It is not intended to diagnose MRSA infection nor to guide or monitor treatment  for MRSA infections. RESULT CALLED TO, READ BACK BY AND VERIFIED WITH: D HART,RN [id number] [facility]      Labs: Basic Metabolic Panel:  Recent Labs Lab 07/22/14 1120 07/22/14 1838 07/23/14 0348 07/24/14 0338 07/25/14 0454 07/26/14 0400  NA 135  --  135 137 137 136  K 4.8  --  4.0 4.3 4.1 3.5  CL 103  --  104 104 102 97*  CO2 20*  --  23 22 27 29   GLUCOSE 91  --  139* 123* 94 115*  BUN 25*  --  26* 30* 26* 25*  CREATININE 1.26* 1.42* 1.43* 1.77* 1.29* 1.33*  CALCIUM 8.7*  --  8.4* 8.1* 8.3* 8.5*   Liver Function Tests:  Recent Labs Lab 07/22/14 1120 07/24/14 0338  AST 88* 54*  ALT 53 37  ALKPHOS 105 91  BILITOT 1.3* 0.8  PROT 8.2* 6.4*  ALBUMIN 3.0* 2.4*    Recent Labs Lab 07/22/14 1120  LIPASE 39   No results for input(s): AMMONIA in the last 168 hours. CBC:  Recent Labs Lab 07/22/14 1120 07/22/14 1838 07/24/14 0338 07/25/14 0454  WBC 5.6 5.7 5.4 6.2  HGB 8.7* 8.4* 7.7* 8.8*  HCT 28.2* 27.8* 25.7* 29.1*  MCV 83.7 84.5 84.3 86.1  PLT 322 309 315 331   Cardiac Enzymes: No results for input(s): CKTOTAL, CKMB, CKMBINDEX, TROPONINI in the last 168 hours. BNP: BNP (last 3 results)  Recent Labs  07/03/14 0210 07/22/14 1120 07/24/14 0348  BNP 3703.7* 3446.9* 1075.1*    ProBNP (last 3 results)  Recent Labs  01/01/14 1107 02/07/14 0123 03/01/14 2250  PROBNP 3225.0* 2648.0* 7421.0*    CBG: No results for input(s): GLUCAP in the last 168 hours.  Patient requested me to speak with his daughter and gave permission to discuss details. Called and discussed with Ms. [name], daughter at length. Discussed issues related to patient's poor compliance with ongoing medical care and substance abuse which is likely complicating his medical problems. She verbalized understanding and indicated that she will discuss with patient and encourage him to take better care of himself.   Signed:  Vernell Leep, MD, FACP, FHM. [facility] Pager  [phone]  If 7PM-7AM, please contact night-coverage [email or web address] Password [email or web address] 07/26/2014, 11:45 AM

## 2014-07-26 NOTE — Discharge Instructions (Signed)

## 2014-07-26 NOTE — Progress Notes (Signed)
Patient daughter's [name] called @ [phone] for ride. States will be here to pick up patient in about 15 minutes. Patient made aware and given discharge materials. Questions answered.

## 2014-08-29 ENCOUNTER — Encounter (HOSPITAL_COMMUNITY): Payer: Self-pay | Admitting: Emergency Medicine

## 2014-08-29 ENCOUNTER — Emergency Department (HOSPITAL_COMMUNITY): Payer: Medicare Other

## 2014-08-29 ENCOUNTER — Inpatient Hospital Stay (HOSPITAL_COMMUNITY)
Admission: EM | Admit: 2014-08-29 | Discharge: 2014-09-08 | DRG: 292 | Disposition: A | Discharge status: Home / Self Care | Payer: Medicare Other | Attending: Internal Medicine | Admitting: Internal Medicine

## 2014-08-29 DIAGNOSIS — I5043 Acute on chronic combined systolic (congestive) and diastolic (congestive) heart failure: Secondary | ICD-10-CM | POA: Diagnosis not present

## 2014-08-29 DIAGNOSIS — I255 Ischemic cardiomyopathy: Secondary | ICD-10-CM | POA: Diagnosis present

## 2014-08-29 DIAGNOSIS — R0602 Shortness of breath: Secondary | ICD-10-CM | POA: Diagnosis not present

## 2014-08-29 DIAGNOSIS — I270 Primary pulmonary hypertension: Secondary | ICD-10-CM | POA: Diagnosis not present

## 2014-08-29 DIAGNOSIS — Z8249 Family history of ischemic heart disease and other diseases of the circulatory system: Secondary | ICD-10-CM

## 2014-08-29 DIAGNOSIS — Z9119 Patient's noncompliance with other medical treatment and regimen: Secondary | ICD-10-CM | POA: Diagnosis present

## 2014-08-29 DIAGNOSIS — N183 Chronic kidney disease, stage 3 unspecified: Secondary | ICD-10-CM | POA: Diagnosis present

## 2014-08-29 DIAGNOSIS — I272 Other secondary pulmonary hypertension: Secondary | ICD-10-CM | POA: Diagnosis present

## 2014-08-29 DIAGNOSIS — I509 Heart failure, unspecified: Secondary | ICD-10-CM

## 2014-08-29 DIAGNOSIS — K761 Chronic passive congestion of liver: Secondary | ICD-10-CM | POA: Diagnosis present

## 2014-08-29 DIAGNOSIS — F17210 Nicotine dependence, cigarettes, uncomplicated: Secondary | ICD-10-CM | POA: Diagnosis present

## 2014-08-29 DIAGNOSIS — K7460 Unspecified cirrhosis of liver: Secondary | ICD-10-CM | POA: Diagnosis present

## 2014-08-29 DIAGNOSIS — Z79899 Other long term (current) drug therapy: Secondary | ICD-10-CM

## 2014-08-29 DIAGNOSIS — F1410 Cocaine abuse, uncomplicated: Secondary | ICD-10-CM | POA: Diagnosis present

## 2014-08-29 DIAGNOSIS — F1910 Other psychoactive substance abuse, uncomplicated: Secondary | ICD-10-CM | POA: Diagnosis present

## 2014-08-29 DIAGNOSIS — I701 Atherosclerosis of renal artery: Secondary | ICD-10-CM | POA: Diagnosis present

## 2014-08-29 DIAGNOSIS — Z951 Presence of aortocoronary bypass graft: Secondary | ICD-10-CM

## 2014-08-29 DIAGNOSIS — I10 Essential (primary) hypertension: Secondary | ICD-10-CM | POA: Diagnosis not present

## 2014-08-29 DIAGNOSIS — J441 Chronic obstructive pulmonary disease with (acute) exacerbation: Secondary | ICD-10-CM | POA: Diagnosis present

## 2014-08-29 DIAGNOSIS — G40909 Epilepsy, unspecified, not intractable, without status epilepticus: Secondary | ICD-10-CM | POA: Diagnosis present

## 2014-08-29 DIAGNOSIS — Z590 Homelessness: Secondary | ICD-10-CM

## 2014-08-29 DIAGNOSIS — I129 Hypertensive chronic kidney disease with stage 1 through stage 4 chronic kidney disease, or unspecified chronic kidney disease: Secondary | ICD-10-CM | POA: Diagnosis present

## 2014-08-29 DIAGNOSIS — R52 Pain, unspecified: Secondary | ICD-10-CM | POA: Insufficient documentation

## 2014-08-29 DIAGNOSIS — Z7982 Long term (current) use of aspirin: Secondary | ICD-10-CM

## 2014-08-29 DIAGNOSIS — E785 Hyperlipidemia, unspecified: Secondary | ICD-10-CM | POA: Diagnosis present

## 2014-08-29 DIAGNOSIS — G4733 Obstructive sleep apnea (adult) (pediatric): Secondary | ICD-10-CM | POA: Diagnosis present

## 2014-08-29 DIAGNOSIS — I2510 Atherosclerotic heart disease of native coronary artery without angina pectoris: Secondary | ICD-10-CM | POA: Diagnosis present

## 2014-08-29 DIAGNOSIS — E1122 Type 2 diabetes mellitus with diabetic chronic kidney disease: Secondary | ICD-10-CM | POA: Diagnosis present

## 2014-08-29 DIAGNOSIS — D509 Iron deficiency anemia, unspecified: Secondary | ICD-10-CM | POA: Diagnosis present

## 2014-08-29 DIAGNOSIS — I739 Peripheral vascular disease, unspecified: Secondary | ICD-10-CM | POA: Diagnosis present

## 2014-08-29 LAB — BASIC METABOLIC PANEL
Anion gap: 10 (ref 5–15)
BUN: 29 mg/dL — ABNORMAL HIGH (ref 6–20)
CHLORIDE: 103 mmol/L (ref 101–111)
CO2: 22 mmol/L (ref 22–32)
Calcium: 8.5 mg/dL — ABNORMAL LOW (ref 8.9–10.3)
Creatinine, Ser: 1.32 mg/dL — ABNORMAL HIGH (ref 0.61–1.24)
GFR calc Af Amer: >60 mL/min (ref >60)
GFR calc non Af Amer: 53 mL/min — ABNORMAL LOW (ref >60)
GLUCOSE: 77 mg/dL (ref 65–99)
Potassium: 4.1 mmol/L (ref 3.5–5.1)
Sodium: 135 mmol/L (ref 135–145)

## 2014-08-29 LAB — CBC WITH DIFFERENTIAL/PLATELET
Basophils Absolute: 0 10*3/uL (ref 0.0–0.1)
Basophils Relative: 0 % (ref 0–1)
EOS ABS: 0.2 10*3/uL (ref 0.0–0.7)
Eosinophils Relative: 3 % (ref 0–5)
HCT: 27.1 % — ABNORMAL LOW (ref 39.0–52.0)
Hemoglobin: 8.2 g/dL — ABNORMAL LOW (ref 13.0–17.0)
LYMPHS ABS: 0.6 10*3/uL — AB (ref 0.7–4.0)
LYMPHS PCT: 12 % (ref 12–46)
MCH: 25.8 pg — AB (ref 26.0–34.0)
MCHC: 30.3 g/dL (ref 30.0–36.0)
MCV: 85.2 fL (ref 78.0–100.0)
MONO ABS: 0.7 10*3/uL (ref 0.1–1.0)
Monocytes Relative: 15 % — ABNORMAL HIGH (ref 3–12)
Neutro Abs: 3.5 10*3/uL (ref 1.7–7.7)
Neutrophils Relative %: 70 % (ref 43–77)
Platelets: 259 10*3/uL (ref 150–400)
RBC: 3.18 MIL/uL — ABNORMAL LOW (ref 4.22–5.81)
RDW: 17.4 % — ABNORMAL HIGH (ref 11.5–15.5)
WBC: 5 10*3/uL (ref 4.0–10.5)

## 2014-08-29 LAB — BRAIN NATRIURETIC PEPTIDE: B Natriuretic Peptide: 3845.1 pg/mL — ABNORMAL HIGH (ref 0.0–100.0)

## 2014-08-29 LAB — I-STAT TROPONIN, ED: Troponin i, poc: 0.02 ng/mL (ref 0.00–0.08)

## 2014-08-29 MED ORDER — HYDRALAZINE HCL 50 MG PO TABS
100.0000 mg | ORAL_TABLET | Freq: Three times a day (TID) | ORAL | Status: DC
  Administered 2014-08-30 – 2014-09-08 (×29): 100 mg via ORAL
  Filled 2014-08-29 (×32): qty 2

## 2014-08-29 MED ORDER — HEPARIN SODIUM (PORCINE) 5000 UNIT/ML IJ SOLN
5000.0000 [IU] | Freq: Three times a day (TID) | INTRAMUSCULAR | Status: DC
  Administered 2014-08-30: 5000 [IU] via SUBCUTANEOUS
  Filled 2014-08-29 (×14): qty 1

## 2014-08-29 MED ORDER — ISOSORBIDE MONONITRATE ER 60 MG PO TB24
60.0000 mg | ORAL_TABLET | Freq: Every day | ORAL | Status: DC
  Administered 2014-08-30 – 2014-09-08 (×10): 60 mg via ORAL
  Filled 2014-08-29 (×10): qty 1

## 2014-08-29 MED ORDER — MORPHINE SULFATE 2 MG/ML IJ SOLN
2.0000 mg | Freq: Once | INTRAMUSCULAR | Status: AC
  Administered 2014-08-30: 2 mg via INTRAVENOUS
  Filled 2014-08-29: qty 1

## 2014-08-29 MED ORDER — PANTOPRAZOLE SODIUM 40 MG PO TBEC
40.0000 mg | DELAYED_RELEASE_TABLET | Freq: Every day | ORAL | Status: DC
  Administered 2014-08-30 – 2014-09-08 (×10): 40 mg via ORAL
  Filled 2014-08-29 (×9): qty 1

## 2014-08-29 MED ORDER — SODIUM CHLORIDE 0.9 % IJ SOLN
3.0000 mL | Freq: Two times a day (BID) | INTRAMUSCULAR | Status: DC
  Administered 2014-08-30 – 2014-09-08 (×18): 3 mL via INTRAVENOUS

## 2014-08-29 MED ORDER — NITROGLYCERIN 0.4 MG SL SUBL: 0.4000 mg | SUBLINGUAL_TABLET | SUBLINGUAL | Status: DC | PRN

## 2014-08-29 MED ORDER — IPRATROPIUM-ALBUTEROL 0.5-2.5 (3) MG/3ML IN SOLN: 3.0000 mL | RESPIRATORY_TRACT | Status: DC | PRN

## 2014-08-29 MED ORDER — LISINOPRIL 10 MG PO TABS
10.0000 mg | ORAL_TABLET | Freq: Two times a day (BID) | ORAL | Status: DC
  Administered 2014-08-30 – 2014-09-08 (×19): 10 mg via ORAL
  Filled 2014-08-29 (×20): qty 1

## 2014-08-29 MED ORDER — FUROSEMIDE 10 MG/ML IJ SOLN
40.0000 mg | Freq: Two times a day (BID) | INTRAMUSCULAR | Status: DC
  Administered 2014-08-30 – 2014-09-01 (×5): 40 mg via INTRAVENOUS
  Filled 2014-08-29 (×6): qty 4

## 2014-08-29 MED ORDER — ASPIRIN EC 81 MG PO TBEC
81.0000 mg | DELAYED_RELEASE_TABLET | Freq: Every day | ORAL | Status: DC
  Administered 2014-08-30 – 2014-09-08 (×10): 81 mg via ORAL
  Filled 2014-08-29 (×10): qty 1

## 2014-08-29 MED ORDER — CLONIDINE HCL 0.2 MG PO TABS
0.2000 mg | ORAL_TABLET | Freq: Two times a day (BID) | ORAL | Status: DC
  Administered 2014-08-30 – 2014-09-08 (×20): 0.2 mg via ORAL
  Filled 2014-08-29 (×21): qty 1

## 2014-08-29 MED ORDER — FUROSEMIDE 10 MG/ML IJ SOLN
60.0000 mg | Freq: Once | INTRAMUSCULAR | Status: AC
  Administered 2014-08-29: 60 mg via INTRAVENOUS
  Filled 2014-08-29: qty 6

## 2014-08-29 MED ORDER — CITALOPRAM HYDROBROMIDE 20 MG PO TABS
20.0000 mg | ORAL_TABLET | Freq: Every day | ORAL | Status: DC
  Administered 2014-08-30 – 2014-09-08 (×10): 20 mg via ORAL
  Filled 2014-08-29 (×10): qty 1

## 2014-08-29 MED ORDER — DIVALPROEX SODIUM ER 500 MG PO TB24
500.0000 mg | ORAL_TABLET | Freq: Two times a day (BID) | ORAL | Status: DC
  Administered 2014-08-30 – 2014-09-08 (×19): 500 mg via ORAL
  Filled 2014-08-29 (×21): qty 1

## 2014-08-29 MED ORDER — METOPROLOL TARTRATE 1 MG/ML IV SOLN: 5.0000 mg | Freq: Four times a day (QID) | INTRAVENOUS | Status: DC | PRN

## 2014-08-29 MED ORDER — CYCLOBENZAPRINE HCL 10 MG PO TABS
5.0000 mg | ORAL_TABLET | Freq: Three times a day (TID) | ORAL | Status: DC | PRN
Start: 2014-08-29 — End: 2014-09-08
  Administered 2014-08-30 – 2014-09-01 (×3): 5 mg via ORAL
  Filled 2014-08-29 (×4): qty 1

## 2014-08-29 MED ORDER — IPRATROPIUM-ALBUTEROL 0.5-2.5 (3) MG/3ML IN SOLN: 3.0000 mL | Freq: Once | RESPIRATORY_TRACT | Status: DC

## 2014-08-29 NOTE — H&P (Signed)
PCP:   No primary care provider on file.   Code Status: Full Code  Chief Complaint:  Leg swelling, pain  HPI: This is a  year old gentleman who has ah/o CHF. Over the last 3 days he's developed increasing swelling on his bilateral legs accompanied by pain. He also c/o some SOB but the pain in his legs take precedence. He states he has occasional legs pains but this is much worse. He has not seen his PCP who he states has retired. He denies wheezing or chest pain. He states he is compliant with his medication but his pain now causes his blood pressure to be uncontrolled.   Review of Systems:  The patient denies anorexia, fever, weight loss,, vision loss, decreased hearing, hoarseness, chest pain, syncope, dyspnea on exertion, peripheral edema, balance deficits, hemoptysis, abdominal pain, melena, hematochezia, severe indigestion/heartburn, hematuria, incontinence, genital sores, muscle weakness, suspicious skin lesions, transient blindness, difficulty walking, depression, unusual weight change, abnormal bleeding, enlarged lymph nodes, angioedema, and breast masses.  Past Medical History: Past Medical History  Diagnosis Date  . Iron deficiency anemia   . Chronic combined systolic and diastolic CHF, NYHA class 2     a. 11/2013 Echo: EF 40-45%, basl-mid inflat AK, Gr 2 DD.  [name] Hyperlipidemia LDL goal <70   . Obstructive sleep apnea     a. not on home CPAP  . Ischemic cardiomyopathy     a. 11/2013 Echo: EF 40-45%, basl-mid inflat AK, Gr 2 DD, mild AI, mod dil LA/RA, mod reduced RV fxn, PASP 57mmHg.  [name] Homelessness   . Moderate to severe pulmonary hypertension 03/2010    a. PA peak pressure of 76 mmHg (per 2D Echo 03/2010)  . Polysubstance abuse     a. cocaine, THC  . AV malformation of gastrointestinal tract     a. w/ h/o GIB.  [name] Family history of early CAD   . DDD (degenerative disc disease), lumbar   . Peripheral vascular disease   . Seizure disorder   . Hypertension   . CAD (coronary  artery disease)     a. s/p 3-vessel CABG (12/2007) // 100% RCA stenosis with collaterals from left system. Severe bifurcation lesions of proxima CXA and OM. Moderate LAD disease - followed by Dr. Wyline Copas in [facility];  b. 11/2013 Myoview: Large fixed inf defect w/o ischemia, EF 35%.  . Diabetes   . Chronic kidney disease (CKD), stage III (moderate)     a. BL SCr 1.5-1.6  . Renal artery stenosis   . Seizures    Past Surgical History  Procedure Laterality Date  . Apc  03/2010    To treat small bowel AVMs  . Esophagogastroduodenoscopy N/A 08/14/2012    Procedure: ESOPHAGOGASTRODUODENOSCOPY (EGD);  Surgeon: [name], MD;  Location: [facility] ENDOSCOPY;  Service: Endoscopy;  Laterality: N/A;  . Hot hemostasis N/A 08/14/2012    Procedure: HOT HEMOSTASIS (ARGON PLASMA COAGULATION/BICAP);  Surgeon: [name], MD;  Location[facility] ENDOSCOPY;  Service: Endoscopy;  Laterality: N/A;  . Esophagogastroduodenoscopy (egd) with propofol N/A 08/04/2013    Procedure: ESOPHAGOGASTRODUODENOSCOPY (EGD) WITH PROPOFOL;  Surgeon: [name], MD;  Location: [facility] ENDOSCOPY;  Service: Endoscopy;  Laterality: N/A;  . Coronary artery bypass graft  12/2007  . Esophagogastroduodenoscopy (egd) with propofol N/A 06/08/2014    Procedure: ESOPHAGOGASTRODUODENOSCOPY (EGD) WITH PROPOFOL;  Surgeon: Milus Banister, MD;  Location: [place];  Service: Endoscopy;  Laterality: N/A;    Medications: Prior to Admission medications   Medication Sig Start Date  End Date Taking? Authorizing Provider  albuterol (PROVENTIL HFA;VENTOLIN HFA) 108 (90 BASE) MCG/ACT inhaler Inhale 2 puffs into the lungs every 6 (six) hours as needed for wheezing or shortness of breath. 04/30/14  Yes Thurnell Lose, MD  aspirin 81 MG tablet Take 81 mg by mouth daily.   Yes Historical Provider, MD  citalopram (CELEXA) 20 MG tablet Take 20 mg by mouth daily.   Yes Historical Provider, MD  cloNIDine (CATAPRES) 0.2 MG tablet Take 1 tablet (0.2 mg total) by mouth 2  (two) times daily. 07/26/14  Yes Modena Jansky, MD  cyclobenzaprine (FLEXERIL) 5 MG tablet Take 1 tablet (5 mg total) by mouth 3 (three) times daily as needed for muscle spasms (chest and leg pains). 07/05/14  Yes Donne Hazel, MD  divalproex (DEPAKOTE ER) 500 MG 24 hr tablet Take 500 mg by mouth 2 (two) times daily. 05/29/14  Yes Historical Provider, MD  ferrous sulfate 325 (65 FE) MG tablet Take 325 mg by mouth 3 (three) times daily with meals.    Yes Historical Provider, MD  furosemide (LASIX) 40 MG tablet Take 1 tablet (40 mg total) by mouth 2 (two) times daily. 07/26/14  Yes Modena Jansky, MD  hydrALAZINE (APRESOLINE) 100 MG tablet Take 100 mg by mouth 3 (three) times daily. 05/29/14  Yes Historical Provider, MD  isosorbide mononitrate (IMDUR) 30 MG 24 hr tablet Take 2 tablets (60 mg total) by mouth daily. 07/26/14  Yes Modena Jansky, MD  lisinopril (PRINIVIL,ZESTRIL) 20 MG tablet Take 0.5 tablets (10 mg total) by mouth 2 (two) times daily. 07/26/14  Yes Modena Jansky, MD  pantoprazole (PROTONIX) 40 MG tablet Take 40 mg by mouth daily.    Yes Historical Provider, MD  potassium chloride SA (KLOR-CON M20) 20 MEQ tablet Take 20 mEq by mouth daily. 01/17/10  Yes Historical Provider, MD  nitroGLYCERIN (NITROSTAT) 0.4 MG SL tablet Place 1 tablet (0.4 mg total) under the tongue every 5 (five) minutes as needed for chest pain (upto max 3 doses at one time.). 07/26/14   Modena Jansky, MD    Allergies:   Allergies  Allergen Reactions  . Motrin [Ibuprofen] Other (See Comments)    Affects kidneys  . Tylenol [Acetaminophen] Other (See Comments)    Affects kidneys    Social History:  reports that he has been smoking Cigarettes.  He has a 14 pack-year smoking history. He has never used smokeless tobacco. He reports that he drinks alcohol. He reports that he uses illicit drugs (Cocaine and Marijuana) about once per week.  Family History: Family History  Problem Relation Age of Onset  . Heart  disease Mother     unknown type  . Heart disease Father 73    died of MI at 88yo  . Heart disease Paternal Grandfather 56    died of MI  . Heart disease    . Heart disease Brother 30    Physical Exam: Filed Vitals:   08/29/14 2030 08/29/14 2045 08/29/14 2100 08/29/14 2115  BP: 184/102 184/96 167/90 166/83  Pulse: 71 70 67 63  Temp:      TempSrc:      Resp: 23 27 25 23   Height:      Weight:      SpO2: 98% 97% 100% 100%    General:  Alert and oriented times three, well developed and nourished, no acute distress Eyes: PERRLA, pink conjunctiva, no scleral icterus ENT: Moist oral mucosa, neck supple, no thyromegaly  Lungs: clear to ascultation, no wheeze, no crackles, no use of accessory muscles Cardiovascular: regular rate and rhythm, no regurgitation, no gallops, no murmurs. No carotid bruits, no JVD Abdomen: soft, positive BS, non-tender, non-distended, no organomegaly, not an acute abdomen GU: not examined Neuro: CN II - XII grossly intact, sensation intact Musculoskeletal: strength 5/5 all extremities, 1+ clubbing B/L LE, positive TTP B/L LE, security device on right ankle, cyanosis or edema Skin: no rash, no subcutaneous crepitation, no decubitus Psych: appropriate patient   Labs on Admission:   Recent Labs  08/29/14 1917  NA 135  K 4.1  CL 103  CO2 22  GLUCOSE 77  BUN 29*  CREATININE 1.32*  CALCIUM 8.5*   No results for input(s): AST, ALT, ALKPHOS, BILITOT, PROT, ALBUMIN in the last 72 hours. No results for input(s): LIPASE, AMYLASE in the last 72 hours.  Recent Labs  08/29/14 1917  WBC 5.0  NEUTROABS 3.5  HGB 8.2*  HCT 27.1*  MCV 85.2  PLT 259   No results for input(s): CKTOTAL, CKMB, CKMBINDEX, TROPONINI in the last 72 hours. Invalid input(s): POCBNP No results for input(s): DDIMER in the last 72 hours. No results for input(s): HGBA1C in the last 72 hours. No results for input(s): CHOL, HDL, LDLCALC, TRIG, CHOLHDL, LDLDIRECT in the last 72  hours. No results for input(s): TSH, T4TOTAL, T3FREE, THYROIDAB in the last 72 hours.  Invalid input(s): FREET3 No results for input(s): VITAMINB12, FOLATE, FERRITIN, TIBC, IRON, RETICCTPCT in the last 72 hours.  Micro Results: No results found for this or any previous visit (from the past 240 hour(s)).   Radiological Exams on Admission: Dg Chest 2 View  08/29/2014   CLINICAL DATA:  Fluid build-up at the legs.  Initial encounter.  EXAM: CHEST  2 VIEW  COMPARISON:  Chest radiograph performed 08/27/2014  FINDINGS: The lungs are well-aerated. Vascular congestion is noted, with increased interstitial markings, concerning for mild interstitial edema. No pleural effusion or pneumothorax is seen.  The heart is enlarged. The patient is status post median sternotomy, with evidence of prior CABG. No acute osseous abnormalities are seen.  IMPRESSION: Vascular congestion and cardiomegaly, with increased interstitial markings, concerning for mild interstitial edema.   Electronically Signed   By: Garald Balding M.D.   On: 08/29/2014 19:35    Assessment/Plan Present on Admission:  . Hypertension uncontrolled -  Bring in for 23 hopur observation -single dose pain medication, PRN BP medication and resume home medications . Acute on chronic CHF -IV lasix, repeat BNP in AM Chronic kidney disease (CKD), stage III (moderate) . Moderate to severe pulmonary hypertension . Polysubstance abuse . Iron deficiency anemia -stable, momitor    Charles [name] 08/29/2014, 10:13 PM

## 2014-08-29 NOTE — ED Notes (Signed)
Report attempted 

## 2014-08-29 NOTE — ED Notes (Signed)
Increased fluid to legs and abdomen. Reports it is painful. History of CHF. Midly SOB, Sats 92% on RA. Denies CP.

## 2014-08-29 NOTE — ED Provider Notes (Signed)
CSN: [id number]     Arrival date & time 08/29/14  1837 History   First MD Initiated Contact with Patient 08/29/14 1839     Chief Complaint  Patient presents with  . Leg Swelling     (Consider location/radiation/quality/duration/timing/severity/associated sxs/prior Treatment) The history is provided by the patient and medical records.   This is a 69 year old male with past medical history significant for hypertension, hyperlipidemia, congestive heart failure, coronary artery disease, polysubstance abuse, presenting to the ED for shortness of breath and fluid retention. Patient states he has had increased "fluid" to his legs and abdomen for the past several days. He states he was seen at [facility] 2 days ago for chest pain but was discharged home.  He denies any current chest pain.  He does have some SOB, worse with exertion.  His weight today is 178, states usually he is in the 160's.  He denies cough, nasal congestion, fever, sweats, chills, or other URI type symptoms.  States he is compliant with his medications.  Denies recent cocaine use.  Patient with O2 sats of 92% on EMS arrival, states he feels better with O2 in place.  He is not usually oxygen dependent.  Past Medical History  Diagnosis Date  . Iron deficiency anemia   . Chronic combined systolic and diastolic CHF, NYHA class 2     a. 11/2013 Echo: EF 40-45%, basl-mid inflat AK, Gr 2 DD.  [name] Hyperlipidemia LDL goal <70   . Obstructive sleep apnea     a. not on home CPAP  . Ischemic cardiomyopathy     a. 11/2013 Echo: EF 40-45%, basl-mid inflat AK, Gr 2 DD, mild AI, mod dil LA/RA, mod reduced RV fxn, PASP 75mmHg.  [name] Homelessness   . Moderate to severe pulmonary hypertension 03/2010    a. PA peak pressure of 76 mmHg (per 2D Echo 03/2010)  . Polysubstance abuse     a. cocaine, THC  . AV malformation of gastrointestinal tract     a. w/ h/o GIB.  [name] Family history of early CAD   . DDD (degenerative disc disease), lumbar   .  Peripheral vascular disease   . Seizure disorder   . Hypertension   . CAD (coronary artery disease)     a. s/p 3-vessel CABG (12/2007) // 100% RCA stenosis with collaterals from left system. Severe bifurcation lesions of proxima CXA and OM. Moderate LAD disease - followed by Dr. Wyline Copas in [facility];  b. 11/2013 Myoview: Large fixed inf defect w/o ischemia, EF 35%.  . Diabetes   . Chronic kidney disease (CKD), stage III (moderate)     a. BL SCr 1.5-1.6  . Renal artery stenosis   . Seizures    Past Surgical History  Procedure Laterality Date  . Apc  03/2010    To treat small bowel AVMs  . Esophagogastroduodenoscopy N/A 08/14/2012    Procedure: ESOPHAGOGASTRODUODENOSCOPY (EGD);  Surgeon: [name], MD;  Location: [facility] ENDOSCOPY;  Service: Endoscopy;  Laterality: N/A;  . Hot hemostasis N/A 08/14/2012    Procedure: HOT HEMOSTASIS (ARGON PLASMA COAGULATION/BICAP);  Surgeon: [name], MD;  Location: [facility] ENDOSCOPY;  Service: Endoscopy;  Laterality: N/A;  . Esophagogastroduodenoscopy (egd) with propofol N/A 08/04/2013    Procedure: ESOPHAGOGASTRODUODENOSCOPY (EGD) WITH PROPOFOL;  Surgeon: [name], MD;  Location: [facility] ENDOSCOPY;  Service: Endoscopy;  Laterality: N/A;  . Coronary artery bypass graft  12/2007  . Esophagogastroduodenoscopy (egd) with propofol N/A 06/08/2014    Procedure: ESOPHAGOGASTRODUODENOSCOPY (EGD) WITH  PROPOFOL;  Surgeon: Milus Banister, MD;  Location: [place];  Service: Endoscopy;  Laterality: N/A;   Family History  Problem Relation Age of Onset  . Heart disease Mother     unknown type  . Heart disease Father 39    died of MI at 22yo  . Heart disease Paternal Grandfather 9    died of MI  . Heart disease    . Heart disease Brother 30   History  Substance Use Topics  . Smoking status: Current Some Day Smoker -- 0.25 packs/day for 56 years    Types: Cigarettes  . Smokeless tobacco: Never Used  . Alcohol Use: Yes     Comment: occasionally drinks, a few times  a month    Review of Systems  Respiratory: Positive for shortness of breath.   Cardiovascular: Positive for leg swelling.  All other systems reviewed and are negative.     Allergies  Motrin and Tylenol  Home Medications   Prior to Admission medications   Medication Sig Start Date End Date Taking? Authorizing Provider  albuterol (PROVENTIL HFA;VENTOLIN HFA) 108 (90 BASE) MCG/ACT inhaler Inhale 2 puffs into the lungs every 6 (six) hours as needed for wheezing or shortness of breath. 04/30/14   Thurnell Lose, MD  aspirin 81 MG tablet Take 81 mg by mouth daily.    Historical Provider, MD  citalopram (CELEXA) 20 MG tablet Take 20 mg by mouth daily.    Historical Provider, MD  cloNIDine (CATAPRES) 0.2 MG tablet Take 1 tablet (0.2 mg total) by mouth 2 (two) times daily. 07/26/14   Modena Jansky, MD  cyclobenzaprine (FLEXERIL) 5 MG tablet Take 1 tablet (5 mg total) by mouth 3 (three) times daily as needed for muscle spasms (chest and leg pains). 07/05/14   Donne Hazel, MD  divalproex (DEPAKOTE ER) 500 MG 24 hr tablet Take 500 mg by mouth 2 (two) times daily. 05/29/14   Historical Provider, MD  ferrous sulfate 325 (65 FE) MG tablet Take 325 mg by mouth 3 (three) times daily with meals.     Historical Provider, MD  furosemide (LASIX) 40 MG tablet Take 1 tablet (40 mg total) by mouth 2 (two) times daily. 07/26/14   Modena Jansky, MD  hydrALAZINE (APRESOLINE) 100 MG tablet Take 100 mg by mouth 3 (three) times daily. 05/29/14   Historical Provider, MD  isosorbide mononitrate (IMDUR) 30 MG 24 hr tablet Take 2 tablets (60 mg total) by mouth daily. 07/26/14   Modena Jansky, MD  lisinopril (PRINIVIL,ZESTRIL) 20 MG tablet Take 0.5 tablets (10 mg total) by mouth 2 (two) times daily. 07/26/14   Modena Jansky, MD  nitroGLYCERIN (NITROSTAT) 0.4 MG SL tablet Place 1 tablet (0.4 mg total) under the tongue every 5 (five) minutes as needed for chest pain (upto max 3 doses at one time.). 07/26/14   Modena Jansky, MD  pantoprazole (PROTONIX) 40 MG tablet Take 40 mg by mouth daily.     Historical Provider, MD   BP 183/90 mmHg  Pulse 72  Temp(Src) 97.8 F (36.6 C) (Oral)  Resp 22  Ht 5\' 5"  (1.651 m)  Wt 178 lb 9.6 oz (81.012 kg)  BMI 29.72 kg/m2  SpO2 100%   Physical Exam  Constitutional: He is oriented to person, place, and time. He appears well-developed and well-nourished. No distress.  HENT:  Head: Normocephalic and atraumatic.  Mouth/Throat: Oropharynx is clear and moist.  Eyes: Conjunctivae and EOM are normal. Pupils  are equal, round, and reactive to light.  Neck: Normal range of motion. Neck supple.  Cardiovascular: Normal rate, regular rhythm and normal heart sounds.   Pulmonary/Chest: Effort normal and breath sounds normal. No respiratory distress. He has no wheezes.  Abdominal: Soft. Bowel sounds are normal. There is no tenderness. There is no guarding.  Musculoskeletal: Normal range of motion.  Pitting edema BLE  Neurological: He is alert and oriented to person, place, and time.  Skin: Skin is warm and dry. He is not diaphoretic.  Psychiatric: He has a normal mood and affect.  Nursing note and vitals reviewed.   ED Course  Procedures (including critical care time) Labs Review Labs Reviewed  CBC WITH DIFFERENTIAL/PLATELET - Abnormal; Notable for the following:    RBC 3.18 (*)    Hemoglobin 8.2 (*)    HCT 27.1 (*)    MCH 25.8 (*)    RDW 17.4 (*)    Lymphs Abs 0.6 (*)    Monocytes Relative 15 (*)    All other components within normal limits  BASIC METABOLIC PANEL - Abnormal; Notable for the following:    BUN 29 (*)    Creatinine, Ser 1.32 (*)    Calcium 8.5 (*)    GFR calc non Af Amer 53 (*)    All other components within normal limits  BRAIN NATRIURETIC PEPTIDE - Abnormal; Notable for the following:    B Natriuretic Peptide 3845.1 (*)    All other components within normal limits  I-STAT TROPOININ, ED    Imaging Review Dg Chest 2 View  08/29/2014    CLINICAL DATA:  Fluid build-up at the legs.  Initial encounter.  EXAM: CHEST  2 VIEW  COMPARISON:  Chest radiograph performed 08/27/2014  FINDINGS: The lungs are well-aerated. Vascular congestion is noted, with increased interstitial markings, concerning for mild interstitial edema. No pleural effusion or pneumothorax is seen.  The heart is enlarged. The patient is status post median sternotomy, with evidence of prior CABG. No acute osseous abnormalities are seen.  IMPRESSION: Vascular congestion and cardiomegaly, with increased interstitial markings, concerning for mild interstitial edema.   Electronically Signed   By: Garald Balding M.D.   On: 08/29/2014 19:35     EKG Interpretation   Date/Time:  Saturday August 29 2014 18:48:27 EDT Ventricular Rate:  72 PR Interval:  179 QRS Duration: 116 QT Interval:  400 QTC Calculation: 438 R Axis:   64 Text Interpretation:  Sinus rhythm Incomplete right bundle branch block  Probable inferior infarct, age indeterminate Confirmed by KOHUT  MD,  STEPHEN ([id number]) on 08/29/2014 6:56:16 PM      MDM   Final diagnoses:  Shortness of breath  Acute on chronic combined systolic and diastolic congestive heart failure   69 year old male here with increased peripheral edema and fluid retention. He has history of CHF, prior exacerbations with similar symptoms. He also reports some shortness of breath. His O2 sats were 92% on room air upon EMS arrival. Patient reports improvement with supplemental oxygen. He is not normally oxygen dependent.  Workup here consistent with CHF exacerbation-- BNP elevated at 3845 correlating with vascular congestion and interstitial edema noted on CXR.  Patient's VS remain stable on 2L supplemental O2.  Feel patient would benefit from admission.  Dose of IV lasix given here.  Case discussed with hospitalist service, Dr. Claria Dice, will admit for further management.  Larene Pickett, PA-C 08/29/14 2201  Virgel Manifold, MD 08/29/14 [phone]

## 2014-08-30 ENCOUNTER — Encounter (HOSPITAL_COMMUNITY): Payer: Self-pay

## 2014-08-30 DIAGNOSIS — E785 Hyperlipidemia, unspecified: Secondary | ICD-10-CM | POA: Diagnosis present

## 2014-08-30 DIAGNOSIS — Z590 Homelessness: Secondary | ICD-10-CM | POA: Diagnosis not present

## 2014-08-30 DIAGNOSIS — I739 Peripheral vascular disease, unspecified: Secondary | ICD-10-CM | POA: Diagnosis present

## 2014-08-30 DIAGNOSIS — I70223 Atherosclerosis of native arteries of extremities with rest pain, bilateral legs: Secondary | ICD-10-CM | POA: Diagnosis not present

## 2014-08-30 DIAGNOSIS — F1910 Other psychoactive substance abuse, uncomplicated: Secondary | ICD-10-CM | POA: Diagnosis not present

## 2014-08-30 DIAGNOSIS — I270 Primary pulmonary hypertension: Secondary | ICD-10-CM | POA: Diagnosis not present

## 2014-08-30 DIAGNOSIS — R52 Pain, unspecified: Secondary | ICD-10-CM | POA: Diagnosis not present

## 2014-08-30 DIAGNOSIS — I5043 Acute on chronic combined systolic (congestive) and diastolic (congestive) heart failure: Secondary | ICD-10-CM | POA: Diagnosis present

## 2014-08-30 DIAGNOSIS — I10 Essential (primary) hypertension: Secondary | ICD-10-CM | POA: Diagnosis not present

## 2014-08-30 DIAGNOSIS — I509 Heart failure, unspecified: Secondary | ICD-10-CM | POA: Diagnosis not present

## 2014-08-30 DIAGNOSIS — R0602 Shortness of breath: Secondary | ICD-10-CM | POA: Diagnosis present

## 2014-08-30 DIAGNOSIS — Z8249 Family history of ischemic heart disease and other diseases of the circulatory system: Secondary | ICD-10-CM | POA: Diagnosis not present

## 2014-08-30 DIAGNOSIS — Z79899 Other long term (current) drug therapy: Secondary | ICD-10-CM | POA: Diagnosis not present

## 2014-08-30 DIAGNOSIS — D509 Iron deficiency anemia, unspecified: Secondary | ICD-10-CM | POA: Diagnosis present

## 2014-08-30 DIAGNOSIS — I5033 Acute on chronic diastolic (congestive) heart failure: Secondary | ICD-10-CM | POA: Diagnosis not present

## 2014-08-30 DIAGNOSIS — Z7982 Long term (current) use of aspirin: Secondary | ICD-10-CM | POA: Diagnosis not present

## 2014-08-30 DIAGNOSIS — I2510 Atherosclerotic heart disease of native coronary artery without angina pectoris: Secondary | ICD-10-CM | POA: Diagnosis present

## 2014-08-30 DIAGNOSIS — N183 Chronic kidney disease, stage 3 (moderate): Secondary | ICD-10-CM | POA: Diagnosis present

## 2014-08-30 DIAGNOSIS — I272 Other secondary pulmonary hypertension: Secondary | ICD-10-CM | POA: Diagnosis present

## 2014-08-30 DIAGNOSIS — K7460 Unspecified cirrhosis of liver: Secondary | ICD-10-CM | POA: Diagnosis present

## 2014-08-30 DIAGNOSIS — F17210 Nicotine dependence, cigarettes, uncomplicated: Secondary | ICD-10-CM | POA: Diagnosis present

## 2014-08-30 DIAGNOSIS — F1410 Cocaine abuse, uncomplicated: Secondary | ICD-10-CM | POA: Diagnosis present

## 2014-08-30 DIAGNOSIS — Z951 Presence of aortocoronary bypass graft: Secondary | ICD-10-CM | POA: Diagnosis not present

## 2014-08-30 DIAGNOSIS — Z9119 Patient's noncompliance with other medical treatment and regimen: Secondary | ICD-10-CM | POA: Diagnosis present

## 2014-08-30 DIAGNOSIS — K761 Chronic passive congestion of liver: Secondary | ICD-10-CM | POA: Diagnosis present

## 2014-08-30 DIAGNOSIS — I255 Ischemic cardiomyopathy: Secondary | ICD-10-CM | POA: Diagnosis present

## 2014-08-30 DIAGNOSIS — G40909 Epilepsy, unspecified, not intractable, without status epilepticus: Secondary | ICD-10-CM | POA: Diagnosis present

## 2014-08-30 DIAGNOSIS — I129 Hypertensive chronic kidney disease with stage 1 through stage 4 chronic kidney disease, or unspecified chronic kidney disease: Secondary | ICD-10-CM | POA: Diagnosis present

## 2014-08-30 DIAGNOSIS — J441 Chronic obstructive pulmonary disease with (acute) exacerbation: Secondary | ICD-10-CM | POA: Diagnosis present

## 2014-08-30 DIAGNOSIS — E1122 Type 2 diabetes mellitus with diabetic chronic kidney disease: Secondary | ICD-10-CM | POA: Diagnosis present

## 2014-08-30 DIAGNOSIS — I701 Atherosclerosis of renal artery: Secondary | ICD-10-CM | POA: Diagnosis present

## 2014-08-30 DIAGNOSIS — G4733 Obstructive sleep apnea (adult) (pediatric): Secondary | ICD-10-CM | POA: Diagnosis present

## 2014-08-30 LAB — CBC
HCT: 26.1 % — ABNORMAL LOW (ref 39.0–52.0)
Hemoglobin: 7.9 g/dL — ABNORMAL LOW (ref 13.0–17.0)
MCH: 25.9 pg — ABNORMAL LOW (ref 26.0–34.0)
MCHC: 30.3 g/dL (ref 30.0–36.0)
MCV: 85.6 fL (ref 78.0–100.0)
Platelets: 255 10*3/uL (ref 150–400)
RBC: 3.05 MIL/uL — ABNORMAL LOW (ref 4.22–5.81)
RDW: 17.6 % — ABNORMAL HIGH (ref 11.5–15.5)
WBC: 4.1 10*3/uL (ref 4.0–10.5)

## 2014-08-30 LAB — BASIC METABOLIC PANEL
Anion gap: 9 (ref 5–15)
BUN: 28 mg/dL — AB (ref 6–20)
CHLORIDE: 102 mmol/L (ref 101–111)
CO2: 25 mmol/L (ref 22–32)
Calcium: 8.4 mg/dL — ABNORMAL LOW (ref 8.9–10.3)
Creatinine, Ser: 1.31 mg/dL — ABNORMAL HIGH (ref 0.61–1.24)
GFR calc non Af Amer: 54 mL/min — ABNORMAL LOW (ref >60)
GLUCOSE: 109 mg/dL — AB (ref 65–99)
Potassium: 3.6 mmol/L (ref 3.5–5.1)
Sodium: 136 mmol/L (ref 135–145)

## 2014-08-30 LAB — MRSA PCR SCREENING: MRSA by PCR: POSITIVE — AB

## 2014-08-30 LAB — BRAIN NATRIURETIC PEPTIDE: B Natriuretic Peptide: 3766.5 pg/mL — ABNORMAL HIGH (ref 0.0–100.0)

## 2014-08-30 LAB — RAPID URINE DRUG SCREEN, HOSP PERFORMED
Amphetamines: NOT DETECTED
BENZODIAZEPINES: NOT DETECTED
Barbiturates: NOT DETECTED
COCAINE: POSITIVE — AB
OPIATES: POSITIVE — AB
TETRAHYDROCANNABINOL: NOT DETECTED

## 2014-08-30 LAB — PHOSPHORUS: PHOSPHORUS: 3.3 mg/dL (ref 2.5–4.6)

## 2014-08-30 LAB — MAGNESIUM: Magnesium: 1.9 mg/dL (ref 1.7–2.4)

## 2014-08-30 MED ORDER — MUPIROCIN 2 % EX OINT
1.0000 "application " | TOPICAL_OINTMENT | Freq: Two times a day (BID) | CUTANEOUS | Status: AC
  Administered 2014-08-30 – 2014-09-03 (×10): 1 via NASAL
  Filled 2014-08-30: qty 22

## 2014-08-30 MED ORDER — CHLORHEXIDINE GLUCONATE CLOTH 2 % EX PADS
6.0000 | MEDICATED_PAD | Freq: Every day | CUTANEOUS | Status: AC
  Administered 2014-08-30 – 2014-09-03 (×5): 6 via TOPICAL

## 2014-08-30 NOTE — Progress Notes (Signed)
Lab notified the RN of the patient's positive MRSA swab at 0430.  The RN released the MRSA order set at that time.

## 2014-08-30 NOTE — Progress Notes (Signed)
[facility] PROGRESS NOTE   Charles [name] YHC:[id number] DOB: [birth year] DOA: 08/29/2014 PCP: No primary care provider on file.  HPI/Subjective: Complains about leg pain as well as abdominal tenderness.  Assessment/Plan: Active Problems:   Iron deficiency anemia   Hypertension   Chronic kidney disease (CKD), stage III (moderate)   Moderate to severe pulmonary hypertension   Polysubstance abuse   CHF exacerbation   CHF (congestive heart failure)   CHF (congestive heart failure), NYHA class I   Acute on chronic combined CHF Patient has LVEF of 45-50%, diffuse hypokinesis and grade 2 diastolic dysfunction on echo in March 2016. Admitted to the hospital with shortness of breath, lower extremity edema and elevated BNP. Started on IV Lasix. This is likely secondary to nonadherence to medications and dietary indiscretion. Follow clinically with intake/output, daily weight and symptoms.  Uncontrolled hypertension Likely secondary to nonadherence to medications. Blood pressure on presentation was 183/103 and now is 137/57.  Polysubstance abuse Patient has positive urine drug tox for cocaine whenever he comes to the hospital. Reported last cocaine use was 7-10 days ago.  CKD stage III Baseline creatinine about 1.3, patient his baseline. Follow renal function closely, check BMP in a.m.   Seizure disorder Patient was on Dilantin which was discontinued last May, no seizure-like activity. Patient does not drive.  CAD status post CABG Continue aspirin, not on beta blockers secondary to ongoing cocaine abuse.  Cirrhotic liver Noted on previous CT scan from May 2016, I'll check hep C. Last INR was 1.3, check INR and albumen in a.m.  Code Status: Full Code Family Communication: Plan discussed with the patient. Disposition Plan: Remains inpatient Diet: Diet Heart Room service appropriate?: Yes; Fluid consistency::  Thin  Consultants:  None  Procedures:  None  Antibiotics:  None   Objective: Filed Vitals:   08/30/14 0443  BP: 137/56  Pulse: 60  Temp: 97.9 F (36.6 C)  Resp: 20    Intake/Output Summary (Last 24 hours) at 08/30/14 1126 Last data filed at 08/30/14 0808  Gross per 24 hour  Intake    240 ml  Output   1050 ml  Net   -810 ml   Filed Weights   08/29/14 1845 08/29/14 2349 08/30/14 0443  Weight: 81.012 kg (178 lb 9.6 oz) 79.289 kg (174 lb 12.8 oz) 79.561 kg (175 lb 6.4 oz)    Exam: General: Alert and awake, oriented x3, not in any acute distress. HEENT: anicteric sclera, pupils reactive to light and accommodation, EOMI CVS: S1-S2 clear, no murmur rubs or gallops Chest: clear to auscultation bilaterally, no wheezing, rales or rhonchi Abdomen: soft nontender, nondistended, normal bowel sounds, no organomegaly Extremities: Bilateral lower extremity +1 to +2 edema, there is black boxy tracking device in the right leg. Neuro: Cranial nerves II-XII intact, no focal neurological deficits  Data Reviewed: Basic Metabolic Panel:  Recent Labs Lab 08/29/14 1917 08/30/14 0519  NA 135 136  K 4.1 3.6  CL 103 102  CO2 22 25  GLUCOSE 77 109*  BUN 29* 28*  CREATININE 1.32* 1.31*  CALCIUM 8.5* 8.4*  MG  --  1.9  PHOS  --  3.3   Liver Function Tests: No results for input(s): AST, ALT, ALKPHOS, BILITOT, PROT, ALBUMIN in the last 168 hours. No results for input(s): LIPASE, AMYLASE in the last 168 hours. No results for input(s): AMMONIA in the last 168 hours. CBC:  Recent Labs Lab 08/29/14 1917 08/30/14 0519  WBC 5.0 4.1  NEUTROABS 3.5  --  HGB 8.2* 7.9*  HCT 27.1* 26.1*  MCV 85.2 85.6  PLT 259 255   Cardiac Enzymes: No results for input(s): CKTOTAL, CKMB, CKMBINDEX, TROPONINI in the last 168 hours. BNP (last 3 results)  Recent Labs  07/24/14 0348 08/29/14 1917 08/30/14 0517  BNP 1075.1* 3845.1* 3766.5*    ProBNP (last 3 results)  Recent Labs   01/01/14 1107 02/07/14 0123 03/01/14 2250  PROBNP 3225.0* 2648.0* 7421.0*    CBG: No results for input(s): GLUCAP in the last 168 hours.  Micro Recent Results (from the past 240 hour(s))  MRSA PCR Screening     Status: Abnormal   Collection Time: 08/30/14 12:59 AM  Result Value Ref Range Status   MRSA by PCR POSITIVE (A) NEGATIVE Final    Comment:        The GeneXpert MRSA Assay (FDA approved for NASAL specimens only), is one component of a comprehensive MRSA colonization surveillance program. It is not intended to diagnose MRSA infection nor to guide or monitor treatment for MRSA infections. RESULT CALLED TO, READ BACK BY AND VERIFIED WITH: HARAWAY,J RN 0424 08/30/14 MITCHELL,L      Studies: Dg Chest 2 View  08/29/2014   CLINICAL DATA:  Fluid build-up at the legs.  Initial encounter.  EXAM: CHEST  2 VIEW  COMPARISON:  Chest radiograph performed 08/27/2014  FINDINGS: The lungs are well-aerated. Vascular congestion is noted, with increased interstitial markings, concerning for mild interstitial edema. No pleural effusion or pneumothorax is seen.  The heart is enlarged. The patient is status post median sternotomy, with evidence of prior CABG. No acute osseous abnormalities are seen.  IMPRESSION: Vascular congestion and cardiomegaly, with increased interstitial markings, concerning for mild interstitial edema.   Electronically Signed   By: Garald Balding M.D.   On: 08/29/2014 19:35    Scheduled Meds: . aspirin EC  81 mg Oral Daily  . Chlorhexidine Gluconate Cloth  6 each Topical Q0600  . citalopram  20 mg Oral Daily  . cloNIDine  0.2 mg Oral BID  . divalproex  500 mg Oral BID  . furosemide  40 mg Intravenous BID  . heparin  5,000 Units Subcutaneous 3 times per day  . hydrALAZINE  100 mg Oral 3 times per day  . isosorbide mononitrate  60 mg Oral Daily  . lisinopril  10 mg Oral BID  . mupirocin ointment  1 application Nasal BID  . pantoprazole  40 mg Oral Daily  . sodium  chloride  3 mL Intravenous Q12H   Continuous Infusions:      Time spent: 35 minutes    [facility] A  [facility] Pager [phone] If 7PM-7AM, please contact night-coverage at [email or web address], password [facility] 08/30/2014, 11:26 AM

## 2014-08-31 LAB — PROTIME-INR
INR: 1.28 (ref 0.00–1.49)
Prothrombin Time: 16.1 seconds — ABNORMAL HIGH (ref 11.6–15.2)

## 2014-08-31 LAB — CBC
HCT: 25.6 % — ABNORMAL LOW (ref 39.0–52.0)
Hemoglobin: 7.8 g/dL — ABNORMAL LOW (ref 13.0–17.0)
MCH: 26.1 pg (ref 26.0–34.0)
MCHC: 30.5 g/dL (ref 30.0–36.0)
MCV: 85.6 fL (ref 78.0–100.0)
Platelets: 255 10*3/uL (ref 150–400)
RBC: 2.99 MIL/uL — ABNORMAL LOW (ref 4.22–5.81)
RDW: 17.8 % — AB (ref 11.5–15.5)
WBC: 4.1 10*3/uL (ref 4.0–10.5)

## 2014-08-31 LAB — COMPREHENSIVE METABOLIC PANEL
ALBUMIN: 2.7 g/dL — AB (ref 3.5–5.0)
ALK PHOS: 75 U/L (ref 38–126)
ALT: 13 U/L — AB (ref 17–63)
ANION GAP: 9 (ref 5–15)
AST: 25 U/L (ref 15–41)
BILIRUBIN TOTAL: 0.7 mg/dL (ref 0.3–1.2)
BUN: 25 mg/dL — ABNORMAL HIGH (ref 6–20)
CHLORIDE: 104 mmol/L (ref 101–111)
CO2: 26 mmol/L (ref 22–32)
Calcium: 8.6 mg/dL — ABNORMAL LOW (ref 8.9–10.3)
Creatinine, Ser: 1.39 mg/dL — ABNORMAL HIGH (ref 0.61–1.24)
GFR calc Af Amer: 58 mL/min — ABNORMAL LOW (ref >60)
GFR calc non Af Amer: 50 mL/min — ABNORMAL LOW (ref >60)
Glucose, Bld: 129 mg/dL — ABNORMAL HIGH (ref 65–99)
POTASSIUM: 4.1 mmol/L (ref 3.5–5.1)
Sodium: 139 mmol/L (ref 135–145)
Total Protein: 6.8 g/dL (ref 6.5–8.1)

## 2014-08-31 LAB — HEPATITIS C ANTIBODY: HCV Ab: >11 s/co ratio — ABNORMAL HIGH (ref 0.0–0.9)

## 2014-08-31 MED ORDER — HYDROCODONE-ACETAMINOPHEN 5-325 MG PO TABS
2.0000 | ORAL_TABLET | Freq: Once | ORAL | Status: AC
  Administered 2014-08-31: 2 via ORAL
  Filled 2014-08-31: qty 2

## 2014-08-31 MED ORDER — OXYCODONE HCL 5 MG PO TABS
10.0000 mg | ORAL_TABLET | Freq: Once | ORAL | Status: AC
  Administered 2014-08-31: 10 mg via ORAL
  Filled 2014-08-31: qty 2

## 2014-08-31 NOTE — Progress Notes (Signed)
[facility] PROGRESS NOTE   Charles [name] BTD:[id number] DOB: Jul 12, 1945 DOA: 08/29/2014 PCP: No primary care provider on file.  HPI/Subjective: Complains about leg pain and not getting anything for pain.  Assessment/Plan: Active Problems:   Iron deficiency anemia   Hypertension   Chronic kidney disease (CKD), stage III (moderate)   Moderate to severe pulmonary hypertension   Polysubstance abuse   CHF exacerbation   CHF (congestive heart failure)   CHF (congestive heart failure), NYHA class I   Acute on chronic combined CHF Patient has LVEF of 45-50%, diffuse hypokinesis and grade 2 diastolic dysfunction on echo in March 2016. Admitted to the hospital with shortness of breath, lower extremity edema and elevated BNP. Started on IV Lasix. This is likely secondary to nonadherence to medications and dietary indiscretion. Follow clinically with intake/output, daily weight and symptoms. Await improved from 178 down to 173 lbs, 1.35 L total negative output.  Uncontrolled hypertension Likely secondary to nonadherence to medications. Blood pressure on presentation was 183/103 and now is 145/80.  Polysubstance abuse Patient has positive urine drug tox for cocaine whenever he comes to the hospital. Reported last cocaine use was 7-10 days ago.  CKD stage III Baseline creatinine about 1.3, patient is at 1.39. Follow renal function closely, check BMP in p.m.   Seizure disorder Patient was on Dilantin which was discontinued last May, no seizure-like activity. Patient does not drive.  CAD status post CABG Continue aspirin, not on beta blockers secondary to ongoing cocaine abuse.  Cirrhotic liver Noted on previous CT scan from May 2016, I'll check hep C. Hypoalbuminema at 2.7; INR wnl at 1.28.  Code Status: Full Code Family Communication: Plan discussed with the patient. Disposition Plan: Remains inpatient Diet: Diet Heart Room service appropriate?: Yes; Fluid  consistency:: Thin  Consultants:  None  Procedures:  None  Antibiotics:  Mupirocin ointment, nasal, BID x 5 days; started 08/30/14.   Objective: Filed Vitals:   08/31/14 1020  BP: 145/80  Pulse: 53  Temp:   Resp: 20    Intake/Output Summary (Last 24 hours) at 08/31/14 1145 Last data filed at 08/31/14 1110  Gross per 24 hour  Intake   1080 ml  Output   1625 ml  Net   -545 ml   Filed Weights   08/29/14 2349 08/30/14 0443 08/31/14 0742  Weight: 79.289 kg (174 lb 12.8 oz) 79.561 kg (175 lb 6.4 oz) 78.654 kg (173 lb 6.4 oz)    Exam: General: Alert and awake, oriented x3, not in any acute distress. Obviously annoyed about not having pain medications for his legs. HEENT: anicteric sclera, EOMI CVS: S1-S2 clear, no murmur rubs or gallops Chest: referred noises from upper airways and mouth Abdomen: soft nontender, nondistended, normal bowel sounds, no organomegaly Extremities: Bilateral lower extremity 1-2+ edema, there is black boxy tracking device on the right leg. Neuro: Cranial nerves II-XII intact, no focal neurological deficits  Data Reviewed: Basic Metabolic Panel:  Recent Labs Lab 08/29/14 1917 08/30/14 0519 08/31/14 0430  NA 135 136 139  K 4.1 3.6 4.1  CL 103 102 104  CO2 22 25 26   GLUCOSE 77 109* 129*  BUN 29* 28* 25*  CREATININE 1.32* 1.31* 1.39*  CALCIUM 8.5* 8.4* 8.6*  MG  --  1.9  --   PHOS  --  3.3  --    Liver Function Tests:  Recent Labs Lab 08/31/14 0430  AST 25  ALT 13*  ALKPHOS 75  BILITOT 0.7  PROT 6.8  ALBUMIN 2.7*   CBC:  Recent Labs Lab 08/29/14 1917 08/30/14 0519 08/31/14 0430  WBC 5.0 4.1 4.1  NEUTROABS 3.5  --   --   HGB 8.2* 7.9* 7.8*  HCT 27.1* 26.1* 25.6*  MCV 85.2 85.6 85.6  PLT 259 255 255   Cardiac Enzymes: No results for input(s): CKTOTAL, CKMB, CKMBINDEX, TROPONINI in the last 168 hours. BNP (last 3 results)  Recent Labs  07/24/14 0348 08/29/14 1917 08/30/14 0517  BNP 1075.1* 3845.1* 3766.5*     ProBNP (last 3 results)  Recent Labs  01/01/14 1107 02/07/14 0123 03/01/14 2250  PROBNP 3225.0* 2648.0* 7421.0*    CBG: No results for input(s): GLUCAP in the last 168 hours.  Micro Recent Results (from the past 240 hour(s))  MRSA PCR Screening     Status: Abnormal   Collection Time: 08/30/14 12:59 AM  Result Value Ref Range Status   MRSA by PCR POSITIVE (A) NEGATIVE Final    Comment:        The GeneXpert MRSA Assay (FDA approved for NASAL specimens only), is one component of a comprehensive MRSA colonization surveillance program. It is not intended to diagnose MRSA infection nor to guide or monitor treatment for MRSA infections. RESULT CALLED TO, READ BACK BY AND VERIFIED WITH: HARAWAY,J RN 0424 08/30/14 MITCHELL,L      Studies: Dg Chest 2 View  08/29/2014   CLINICAL DATA:  Fluid build-up at the legs.  Initial encounter.  EXAM: CHEST  2 VIEW  COMPARISON:  Chest radiograph performed 08/27/2014  FINDINGS: The lungs are well-aerated. Vascular congestion is noted, with increased interstitial markings, concerning for mild interstitial edema. No pleural effusion or pneumothorax is seen.  The heart is enlarged. The patient is status post median sternotomy, with evidence of prior CABG. No acute osseous abnormalities are seen.  IMPRESSION: Vascular congestion and cardiomegaly, with increased interstitial markings, concerning for mild interstitial edema.   Electronically Signed   By: Garald Balding M.D.   On: 08/29/2014 19:35    Scheduled Meds: . aspirin EC  81 mg Oral Daily  . Chlorhexidine Gluconate Cloth  6 each Topical Q0600  . citalopram  20 mg Oral Daily  . cloNIDine  0.2 mg Oral BID  . divalproex  500 mg Oral BID  . furosemide  40 mg Intravenous BID  . heparin  5,000 Units Subcutaneous 3 times per day  . hydrALAZINE  100 mg Oral 3 times per day  . isosorbide mononitrate  60 mg Oral Daily  . lisinopril  10 mg Oral BID  . mupirocin ointment  1 application Nasal BID   . pantoprazole  40 mg Oral Daily  . sodium chloride  3 mL Intravenous Q12H   Continuous Infusions:      Time spent: 35 minutes    Eula Flax PA-S  [facility] Pager [phone] If 7PM-7AM, please contact night-coverage at [email or web address], password [facility] 08/31/2014, 11:45 AM  LOS: 1 day       Addendum  Patient seen and examined, chart and data base reviewed.  I agree with the above assessment and plan.  For full details please see Mrs. Eula Flax PA-S note.  I reviewed and amended the above note as appropriate.   Birdie Hopes, MD [facility] Pager: [phone] 08/31/2014, 1:11 PM

## 2014-09-01 LAB — HEPATITIS C ANTIBODY: HCV AB: 0.1 {s_co_ratio} (ref 0.0–0.9)

## 2014-09-01 MED ORDER — FUROSEMIDE 10 MG/ML IJ SOLN
80.0000 mg | Freq: Two times a day (BID) | INTRAMUSCULAR | Status: DC
  Administered 2014-09-01 – 2014-09-04 (×6): 80 mg via INTRAVENOUS
  Filled 2014-09-01 (×9): qty 8

## 2014-09-01 MED ORDER — HYDROCODONE-ACETAMINOPHEN 5-325 MG PO TABS
1.0000 | ORAL_TABLET | Freq: Four times a day (QID) | ORAL | Status: DC | PRN
  Administered 2014-09-03 – 2014-09-05 (×5): 2 via ORAL
  Filled 2014-09-01 (×6): qty 2

## 2014-09-01 MED ORDER — HYDROCODONE-ACETAMINOPHEN 5-325 MG PO TABS
2.0000 | ORAL_TABLET | Freq: Once | ORAL | Status: AC
Start: 2014-09-01 — End: 2014-09-01
  Administered 2014-09-01: 2 via ORAL
  Filled 2014-09-01: qty 2

## 2014-09-01 MED ORDER — FUROSEMIDE 10 MG/ML IJ SOLN
40.0000 mg | Freq: Once | INTRAMUSCULAR | Status: AC
Start: 2014-09-01 — End: 2014-09-01
  Administered 2014-09-01: 40 mg via INTRAVENOUS

## 2014-09-01 NOTE — Progress Notes (Signed)
[facility] PROGRESS NOTE   Charles [name] WUJ:[id number] DOB: 1945/11/07 DOA: 08/29/2014 PCP: No primary care provider on file.  HPI/Subjective: Patient complaining about lower extremity edema and some abdominal pain.  Assessment/Plan: Active Problems:   Iron deficiency anemia   Hypertension   Chronic kidney disease (CKD), stage III (moderate)   Moderate to severe pulmonary hypertension   Polysubstance abuse   CHF exacerbation   CHF (congestive heart failure)   CHF (congestive heart failure), NYHA class I   Acute on chronic combined CHF Patient has LVEF of 45-50%, diffuse hypokinesis and grade 2 diastolic dysfunction on echo in March 2016. Admitted to the hospital with shortness of breath, lower extremity edema and elevated BNP. Started on IV Lasix. This is likely secondary to nonadherence to medications and dietary indiscretion. Follow clinically with intake/output, daily weight and symptoms. Symptoms improving but lower extremity edema still there, will increase Lasix.  Abdominal discomfort Patient has acute on chronic abdominal discomfort. Recent CT scan (on 07/22/14) showed no acute abnormalities, cirrhotic liver with associated small ascites. This is could be secondary to hepatic congestion, I will not repeat CT scan unless he develops more significant symptoms.  Uncontrolled hypertension Likely secondary to nonadherence to medications. Blood pressure on presentation was 183/103 and now is 145/80, improved.  Polysubstance abuse Patient has positive urine drug tox for cocaine whenever he comes to the hospital. Reported last cocaine use was 7-10 days ago.  CKD stage III Baseline creatinine about 1.3, patient is at 1.39. Follow renal function closely, check BMP in p.m.   Seizure disorder Patient was on Dilantin which was discontinued last May, no seizure-like activity. Patient does not drive.  CAD status post CABG Continue aspirin, not on beta blockers  secondary to ongoing cocaine abuse.  Cirrhotic liver Noted on previous CT scan from May 2016, I'll check hep C. Hypoalbuminema at 2.7; INR wnl at 1.28.  Code Status: Full Code Family Communication: Plan discussed with the patient. Disposition Plan: Remains inpatient Diet: Diet Heart Room service appropriate?: Yes; Fluid consistency:: Thin; Fluid restriction:: 1200 mL Fluid  Consultants:  None  Procedures:  None  Antibiotics:  Mupirocin ointment, nasal, BID x 5 days; started 08/30/14.   Objective: Filed Vitals:   09/01/14 0958  BP: 126/53  Pulse:   Temp:   Resp:     Intake/Output Summary (Last 24 hours) at 09/01/14 1213 Last data filed at 09/01/14 1009  Gross per 24 hour  Intake   1697 ml  Output   1775 ml  Net    -78 ml   Filed Weights   08/30/14 0443 08/31/14 0742 09/01/14 0602  Weight: 79.561 kg (175 lb 6.4 oz) 78.654 kg (173 lb 6.4 oz) 79.924 kg (176 lb 3.2 oz)    Exam: General: Alert and awake, oriented x3, not in any acute distress. Obviously annoyed about not having pain medications for his legs. HEENT: anicteric sclera, EOMI CVS: S1-S2 clear, no murmur rubs or gallops Chest: referred noises from upper airways and mouth Abdomen: soft nontender, nondistended, normal bowel sounds, no organomegaly Extremities: Bilateral lower extremity 1-2+ edema, there is black boxy tracking device on the right leg. Neuro: Cranial nerves II-XII intact, no focal neurological deficits  Data Reviewed: Basic Metabolic Panel:  Recent Labs Lab 08/29/14 1917 08/30/14 0519 08/31/14 0430  NA 135 136 139  K 4.1 3.6 4.1  CL 103 102 104  CO2 22 25 26   GLUCOSE 77 109* 129*  BUN 29* 28* 25*  CREATININE 1.32* 1.31* 1.39*  CALCIUM 8.5* 8.4* 8.6*  MG  --  1.9  --   PHOS  --  3.3  --    Liver Function Tests:  Recent Labs Lab 08/31/14 0430  AST 25  ALT 13*  ALKPHOS 75  BILITOT 0.7  PROT 6.8  ALBUMIN 2.7*   CBC:  Recent Labs Lab 08/29/14 1917 08/30/14 0519  08/31/14 0430  WBC 5.0 4.1 4.1  NEUTROABS 3.5  --   --   HGB 8.2* 7.9* 7.8*  HCT 27.1* 26.1* 25.6*  MCV 85.2 85.6 85.6  PLT 259 255 255   Cardiac Enzymes: No results for input(s): CKTOTAL, CKMB, CKMBINDEX, TROPONINI in the last 168 hours. BNP (last 3 results)  Recent Labs  07/24/14 0348 08/29/14 1917 08/30/14 0517  BNP 1075.1* 3845.1* 3766.5*    ProBNP (last 3 results)  Recent Labs  01/01/14 1107 02/07/14 0123 03/01/14 2250  PROBNP 3225.0* 2648.0* 7421.0*    CBG: No results for input(s): GLUCAP in the last 168 hours.  Micro Recent Results (from the past 240 hour(s))  MRSA PCR Screening     Status: Abnormal   Collection Time: 08/30/14 12:59 AM  Result Value Ref Range Status   MRSA by PCR POSITIVE (A) NEGATIVE Final    Comment:        The GeneXpert MRSA Assay (FDA approved for NASAL specimens only), is one component of a comprehensive MRSA colonization surveillance program. It is not intended to diagnose MRSA infection nor to guide or monitor treatment for MRSA infections. RESULT CALLED TO, READ BACK BY AND VERIFIED WITH: HARAWAY,J RN 0424 08/30/14 MITCHELL,L      Studies: No results found.  Scheduled Meds: . aspirin EC  81 mg Oral Daily  . Chlorhexidine Gluconate Cloth  6 each Topical Q0600  . citalopram  20 mg Oral Daily  . cloNIDine  0.2 mg Oral BID  . divalproex  500 mg Oral BID  . furosemide  40 mg Intravenous BID  . heparin  5,000 Units Subcutaneous 3 times per day  . hydrALAZINE  100 mg Oral 3 times per day  . isosorbide mononitrate  60 mg Oral Daily  . lisinopril  10 mg Oral BID  . mupirocin ointment  1 application Nasal BID  . pantoprazole  40 mg Oral Daily  . sodium chloride  3 mL Intravenous Q12H   Continuous Infusions:      Time spent: 35 minutes    [facility] A MD  [facility] Pager [phone] If 7PM-7AM, please contact night-coverage at [email or web address], password [facility] 09/01/2014, 12:13 PM  LOS: 2 days

## 2014-09-01 NOTE — Progress Notes (Signed)
Pt. C/o sharp, shooting leg pain. Legs are swollen with pitting edema. On call NP, K. Schorr, made aware via text page. RN will continue to monitor pt. For changes in condition. Varun Jourdan, Katherine Roan

## 2014-09-02 DIAGNOSIS — I270 Primary pulmonary hypertension: Secondary | ICD-10-CM

## 2014-09-02 DIAGNOSIS — I10 Essential (primary) hypertension: Secondary | ICD-10-CM

## 2014-09-02 DIAGNOSIS — D509 Iron deficiency anemia, unspecified: Secondary | ICD-10-CM

## 2014-09-02 LAB — RENAL FUNCTION PANEL
ANION GAP: 8 (ref 5–15)
Albumin: 2.5 g/dL — ABNORMAL LOW (ref 3.5–5.0)
BUN: 27 mg/dL — ABNORMAL HIGH (ref 6–20)
CALCIUM: 8.4 mg/dL — AB (ref 8.9–10.3)
CO2: 30 mmol/L (ref 22–32)
Chloride: 97 mmol/L — ABNORMAL LOW (ref 101–111)
Creatinine, Ser: 1.3 mg/dL — ABNORMAL HIGH (ref 0.61–1.24)
GFR, EST NON AFRICAN AMERICAN: 54 mL/min — AB (ref >60)
GLUCOSE: 112 mg/dL — AB (ref 65–99)
PHOSPHORUS: 3.5 mg/dL (ref 2.5–4.6)
POTASSIUM: 3.6 mmol/L (ref 3.5–5.1)
SODIUM: 135 mmol/L (ref 135–145)

## 2014-09-02 LAB — CBC
HCT: 26.6 % — ABNORMAL LOW (ref 39.0–52.0)
Hemoglobin: 8.1 g/dL — ABNORMAL LOW (ref 13.0–17.0)
MCH: 25.6 pg — AB (ref 26.0–34.0)
MCHC: 30.5 g/dL (ref 30.0–36.0)
MCV: 83.9 fL (ref 78.0–100.0)
PLATELETS: 272 10*3/uL (ref 150–400)
RBC: 3.17 MIL/uL — ABNORMAL LOW (ref 4.22–5.81)
RDW: 17 % — ABNORMAL HIGH (ref 11.5–15.5)
WBC: 3.8 10*3/uL — ABNORMAL LOW (ref 4.0–10.5)

## 2014-09-02 LAB — IRON AND TIBC
Iron: 27 ug/dL — ABNORMAL LOW (ref 45–182)
Saturation Ratios: 7 % — ABNORMAL LOW (ref 17.9–39.5)
TIBC: 402 ug/dL (ref 250–450)
UIBC: 375 ug/dL

## 2014-09-02 LAB — FERRITIN: FERRITIN: 40 ng/mL (ref 24–336)

## 2014-09-02 LAB — FOLATE: Folate: 11.3 ng/mL (ref >5.9)

## 2014-09-02 LAB — RETICULOCYTES
RBC.: 3.17 MIL/uL — AB (ref 4.22–5.81)
Retic Count, Absolute: 66.6 10*3/uL (ref 19.0–186.0)
Retic Ct Pct: 2.1 % (ref 0.4–3.1)

## 2014-09-02 LAB — VITAMIN B12: VITAMIN B 12: 354 pg/mL (ref 180–914)

## 2014-09-02 MED ORDER — FUROSEMIDE 10 MG/ML IJ SOLN
40.0000 mg | Freq: Once | INTRAMUSCULAR | Status: AC
  Administered 2014-09-02: 40 mg via INTRAVENOUS

## 2014-09-02 MED ORDER — IPRATROPIUM-ALBUTEROL 0.5-2.5 (3) MG/3ML IN SOLN
RESPIRATORY_TRACT | Status: AC
  Filled 2014-09-02: qty 3

## 2014-09-02 MED ORDER — IPRATROPIUM-ALBUTEROL 0.5-2.5 (3) MG/3ML IN SOLN: 3.0000 mL | RESPIRATORY_TRACT | Status: DC | PRN

## 2014-09-02 MED ORDER — IPRATROPIUM-ALBUTEROL 0.5-2.5 (3) MG/3ML IN SOLN
3.0000 mL | RESPIRATORY_TRACT | Status: DC
  Administered 2014-09-02 (×4): 3 mL via RESPIRATORY_TRACT
  Filled 2014-09-02 (×3): qty 3

## 2014-09-02 MED ORDER — METHYLPREDNISOLONE SODIUM SUCC 125 MG IJ SOLR
60.0000 mg | Freq: Four times a day (QID) | INTRAMUSCULAR | Status: DC
  Administered 2014-09-02 – 2014-09-03 (×5): 60 mg via INTRAVENOUS
  Filled 2014-09-02 (×3): qty 0.96
  Filled 2014-09-02: qty 2
  Filled 2014-09-02: qty 0.96
  Filled 2014-09-02 (×4): qty 2

## 2014-09-02 MED ORDER — BUDESONIDE-FORMOTEROL FUMARATE 80-4.5 MCG/ACT IN AERO
2.0000 | INHALATION_SPRAY | Freq: Two times a day (BID) | RESPIRATORY_TRACT | Status: DC
  Administered 2014-09-03 – 2014-09-08 (×10): 2 via RESPIRATORY_TRACT
  Filled 2014-09-02 (×2): qty 6.9

## 2014-09-02 MED ORDER — MORPHINE SULFATE 2 MG/ML IJ SOLN
2.0000 mg | INTRAMUSCULAR | Status: DC | PRN
  Administered 2014-09-02 – 2014-09-03 (×3): 2 mg via INTRAVENOUS
  Filled 2014-09-02 (×4): qty 1

## 2014-09-02 NOTE — Progress Notes (Signed)
[facility]                                                                              Patient Demographics  Charles [name], is a 69 y.o. male, DOB - 08-Dec-1945, GYI:[id number]  Admit date - 08/29/2014   Admitting Physician Quintella Baton, MD  Outpatient Primary MD for the patient is No primary care provider on file.  LOS - 3   Chief Complaint  Patient presents with  . Leg Swelling       Brief HPI   Patient is a 69 year old male with chronic combined systolic and diastolic CHF, known history of iron deficiency anemia, hyperlipidemia, moderate to severe pulmonary hypertension, polysubstance abuse, seizure disorder, CAD, hypertension, seizures presented with leg swelling and pain. Over the last 3 days he's developed increasing swelling on his bilateral legs accompanied by pain. He complained of some SOB but the pain in his legs take precedence. He stated he has occasional legs pains but this is much worse. He has not seen his PCP who he states has retired. He denied wheezing or chest pain. He states he is compliant with his medication but his pain now causes his blood pressure to be uncontrolled.     Assessment & Plan    Principal problem  Acute on chronic combined CHF with abdominal bloating and peripheral edema; likely due to medical noncompliance and dietary indiscretion.  - 2-D echo in March 2016 had shown EF of 45-50%, diffuse hypokinesis and grade 2 diastolic dysfunction - Continue IV daily diuresis, strict I's and O's and daily weights, add extra dose of Lasix 40 mg IV 1  Active problems Leg pains - will check ABIs, possibly could have peripheral vascular disease due to #1 and uncontrolled hypertension  Acute COPD exacerbation Placed on scheduled nebs, Solu-Medrol, Symbicort, continue O2  Chronic history of iron deficiency anemia - will check stool occult test, placed on SCDs anemia panel showed iron of 27, ferritin 40, saturation ratio 7  -  Outpatient GI workup   Abdominal discomfort- has abdominal bloating likely due to #1, no pain currently  Recent CT scan (on 07/22/14) showed no acute abnormalities, cirrhotic liver with associated small ascites.Likely due to hepatic congestion   Uncontrolled hypertension Likely secondary to nonadherence to medications, Now improving  Polysubstance abuse Reported last cocaine use was 7-10 days ago.Urine drug screen positive for cocaine on 6/12 and has been positive on every admission.  CKD stage III Baseline creatinine about 1.3,  follow renal function closely with IV diuresis.   Seizure disorder Patient was on Dilantin which was discontinued last May, no seizure-like activity. Patient does not drive.  CAD status post CABG Continue aspirin, not on beta blockers secondary to ongoing cocaine abuse.  Cirrhotic liver Noted on previous CT scan from May 2016 Hypoalbuminema at 2.7; INR wnl at 1.28.  Code Status: Full code   Family Communication: Discussed in detail with the patient, all imaging results, lab results explained to the patient   Disposition Plan: Hopefully next 24-48 hours  Time Spent in minutes 25  minutes  Procedures  None  Consults   None  DVT Prophylaxis  SCD's  Medications  Scheduled Meds: . aspirin EC  81 mg Oral Daily  . budesonide-formoterol  2 puff Inhalation BID  . Chlorhexidine Gluconate Cloth  6 each Topical Q0600  . citalopram  20 mg Oral Daily  . cloNIDine  0.2 mg Oral BID  . divalproex  500 mg Oral BID  . furosemide  80 mg Intravenous BID  . hydrALAZINE  100 mg Oral 3 times per day  . ipratropium-albuterol  3 mL Nebulization Q4H  . isosorbide mononitrate  60 mg Oral Daily  . lisinopril  10 mg Oral BID  . methylPREDNISolone (SOLU-MEDROL) injection  60 mg Intravenous Q6H  . mupirocin ointment  1 application Nasal BID  . pantoprazole  40 mg Oral Daily  . sodium chloride  3 mL Intravenous Q12H   Continuous Infusions:  PRN  Meds:.cyclobenzaprine, HYDROcodone-acetaminophen, metoprolol, morphine injection, nitroGLYCERIN   Antibiotics   Anti-infectives    None        Subjective:   Charles [name] was seen and examined today.  Complaining of pain in the legs, bloating. Patient denies dizziness, chest pain, shortness of breath, abdominal pain, N/V/D/C, new weakness, numbess, tingling. No acute events overnight.    Objective:   Blood pressure 147/82, pulse 59, temperature 97.5 F (36.4 C), temperature source Oral, resp. rate 18, height 5\' 5"  (1.651 m), weight 75.433 kg (166 lb 4.8 oz), SpO2 99 %.  Wt Readings from Last 3 Encounters:  09/02/14 75.433 kg (166 lb 4.8 oz)  07/26/14 73.8 kg (162 lb 11.2 oz)  07/05/14 76.114 kg (167 lb 12.8 oz)     Intake/Output Summary (Last 24 hours) at 09/02/14 1051 Last data filed at 09/02/14 0909  Gross per 24 hour  Intake   1040 ml  Output   3125 ml  Net  -2085 ml    Exam  General: Alert and oriented x 3, NAD  HEENT:  PERRLA, EOMI, Anicteric Sclera, mucous membranes moist.   Neck: Supple, no JVD, no masses  CVS: S1 S2 auscultated, no rubs, murmurs or gallops. Regular rate and rhythm.  Respiratory:  Diffuse expiratory wheezing bilaterally  Abdomen : Soft, nontender, nondistended, + bowel sounds  Ext: no cyanosis clubbing 2+ edema, tracking device on the right leg   Neuro: AAOx3, Cr N's II- XII. Strength 5/5 upper and lower extremities bilaterally  Skin: No rashes  Psych: Normal affect and demeanor, alert and oriented x3    Data Review   Micro Results Recent Results (from the past 240 hour(s))  MRSA PCR Screening     Status: Abnormal   Collection Time: 08/30/14 12:59 AM  Result Value Ref Range Status   MRSA by PCR POSITIVE (A) NEGATIVE Final    Comment:        The GeneXpert MRSA Assay (FDA approved for NASAL specimens only), is one component of a comprehensive MRSA colonization surveillance program. It is not intended to diagnose  MRSA infection nor to guide or monitor treatment for MRSA infections. RESULT CALLED TO, READ BACK BY AND VERIFIED WITH: HARAWAY,J RN 0424 08/30/14 MITCHELL,L     Radiology Reports Dg Chest 2 View  08/29/2014   CLINICAL DATA:  Fluid build-up at the legs.  Initial encounter.  EXAM: CHEST  2 VIEW  COMPARISON:  Chest radiograph performed 08/27/2014  FINDINGS: The lungs are well-aerated. Vascular congestion is noted, with increased interstitial markings, concerning for mild interstitial edema. No pleural effusion or pneumothorax is seen.  The heart is enlarged. The patient is status post median sternotomy,  with evidence of prior CABG. No acute osseous abnormalities are seen.  IMPRESSION: Vascular congestion and cardiomegaly, with increased interstitial markings, concerning for mild interstitial edema.   Electronically Signed   By: Garald Balding M.D.   On: 08/29/2014 19:35    CBC  Recent Labs Lab 08/29/14 1917 08/30/14 0519 08/31/14 0430 09/02/14 0925  WBC 5.0 4.1 4.1 3.8*  HGB 8.2* 7.9* 7.8* 8.1*  HCT 27.1* 26.1* 25.6* 26.6*  PLT 259 255 255 272  MCV 85.2 85.6 85.6 83.9  MCH 25.8* 25.9* 26.1 25.6*  MCHC 30.3 30.3 30.5 30.5  RDW 17.4* 17.6* 17.8* 17.0*  LYMPHSABS 0.6*  --   --   --   MONOABS 0.7  --   --   --   EOSABS 0.2  --   --   --   BASOSABS 0.0  --   --   --     Chemistries   Recent Labs Lab 08/29/14 1917 08/30/14 0519 08/31/14 0430 09/02/14 0259  NA 135 136 139 135  K 4.1 3.6 4.1 3.6  CL 103 102 104 97*  CO2 22 25 26 30   GLUCOSE 77 109* 129* 112*  BUN 29* 28* 25* 27*  CREATININE 1.32* 1.31* 1.39* 1.30*  CALCIUM 8.5* 8.4* 8.6* 8.4*  MG  --  1.9  --   --   AST  --   --  25  --   ALT  --   --  13*  --   ALKPHOS  --   --  75  --   BILITOT  --   --  0.7  --    ------------------------------------------------------------------------------------------------------------------ estimated creatinine clearance is 50.2 mL/min (by C-G formula based on Cr of  1.3). ------------------------------------------------------------------------------------------------------------------ No results for input(s): HGBA1C in the last 72 hours. ------------------------------------------------------------------------------------------------------------------ No results for input(s): CHOL, HDL, LDLCALC, TRIG, CHOLHDL, LDLDIRECT in the last 72 hours. ------------------------------------------------------------------------------------------------------------------ No results for input(s): TSH, T4TOTAL, T3FREE, THYROIDAB in the last 72 hours.  Invalid input(s): FREET3 ------------------------------------------------------------------------------------------------------------------  Recent Labs  09/02/14 0925  RETICCTPCT 2.1    Coagulation profile  Recent Labs Lab 08/31/14 0430  INR 1.28    No results for input(s): DDIMER in the last 72 hours.  Cardiac Enzymes No results for input(s): CKMB, TROPONINI, MYOGLOBIN in the last 168 hours.  Invalid input(s): CK ------------------------------------------------------------------------------------------------------------------ Invalid input(s): POCBNP  No results for input(s): GLUCAP in the last 72 hours.   Charles [name] M.D. [facility] 09/02/2014, 10:51 AM  Pager: [phone]   Between 7am to 7pm - call Pager - [phone]  After 7pm go to [email or web address] - password [email or web address]  Call night coverage person covering after 7pm

## 2014-09-03 ENCOUNTER — Encounter (HOSPITAL_COMMUNITY): Payer: Self-pay | Admitting: Student

## 2014-09-03 ENCOUNTER — Ambulatory Visit (HOSPITAL_COMMUNITY): Payer: Medicare Other

## 2014-09-03 DIAGNOSIS — F1910 Other psychoactive substance abuse, uncomplicated: Secondary | ICD-10-CM

## 2014-09-03 DIAGNOSIS — I5033 Acute on chronic diastolic (congestive) heart failure: Secondary | ICD-10-CM

## 2014-09-03 DIAGNOSIS — R52 Pain, unspecified: Secondary | ICD-10-CM

## 2014-09-03 DIAGNOSIS — I509 Heart failure, unspecified: Secondary | ICD-10-CM

## 2014-09-03 LAB — CBC
HCT: 24.3 % — ABNORMAL LOW (ref 39.0–52.0)
Hemoglobin: 7.5 g/dL — ABNORMAL LOW (ref 13.0–17.0)
MCH: 25.6 pg — ABNORMAL LOW (ref 26.0–34.0)
MCHC: 30.9 g/dL (ref 30.0–36.0)
MCV: 82.9 fL (ref 78.0–100.0)
PLATELETS: 273 10*3/uL (ref 150–400)
RBC: 2.93 MIL/uL — ABNORMAL LOW (ref 4.22–5.81)
RDW: 16.8 % — AB (ref 11.5–15.5)
WBC: 9.2 10*3/uL (ref 4.0–10.5)

## 2014-09-03 LAB — RENAL FUNCTION PANEL
ALBUMIN: 2.6 g/dL — AB (ref 3.5–5.0)
Anion gap: 13 (ref 5–15)
BUN: 38 mg/dL — ABNORMAL HIGH (ref 6–20)
CHLORIDE: 93 mmol/L — AB (ref 101–111)
CO2: 27 mmol/L (ref 22–32)
Calcium: 8.4 mg/dL — ABNORMAL LOW (ref 8.9–10.3)
Creatinine, Ser: 1.38 mg/dL — ABNORMAL HIGH (ref 0.61–1.24)
GFR calc non Af Amer: 50 mL/min — ABNORMAL LOW (ref >60)
GFR, EST AFRICAN AMERICAN: 58 mL/min — AB (ref >60)
Glucose, Bld: 216 mg/dL — ABNORMAL HIGH (ref 65–99)
Phosphorus: 3.1 mg/dL (ref 2.5–4.6)
Potassium: 3.8 mmol/L (ref 3.5–5.1)
Sodium: 133 mmol/L — ABNORMAL LOW (ref 135–145)

## 2014-09-03 MED ORDER — SODIUM CHLORIDE 0.9 % IV SOLN
510.0000 mg | INTRAVENOUS | Status: DC
  Administered 2014-09-03: 510 mg via INTRAVENOUS
  Filled 2014-09-03: qty 17

## 2014-09-03 MED ORDER — METHYLPREDNISOLONE SODIUM SUCC 125 MG IJ SOLR
60.0000 mg | Freq: Two times a day (BID) | INTRAMUSCULAR | Status: DC
  Administered 2014-09-03 – 2014-09-04 (×2): 60 mg via INTRAVENOUS
  Filled 2014-09-03 (×2): qty 0.96

## 2014-09-03 NOTE — Progress Notes (Signed)
[facility]                                                                              Patient Demographics  Charles [name], is a 69 y.o. male, DOB - March 09, 1946, GMW:[id number]  Admit date - 08/29/2014   Admitting Physician Quintella Baton, MD  Outpatient Primary MD for the patient is No primary care provider on file.  LOS - 4   Chief Complaint  Patient presents with  . Leg Swelling       Brief HPI   Patient is a 69 year old male with chronic combined systolic and diastolic CHF, known history of iron deficiency anemia, hyperlipidemia, moderate to severe pulmonary hypertension, polysubstance abuse, seizure disorder, CAD, hypertension, seizures presented with leg swelling and pain. Over the last 3 days he's developed increasing swelling on his bilateral legs accompanied by pain. He complained of some SOB but the pain in his legs take precedence. He stated he has occasional legs pains but this is much worse. He has not seen his PCP who he states has retired. He denied wheezing or chest pain. He states he is compliant with his medication but his pain now causes his blood pressure to be uncontrolled.     Assessment & Plan    Principal problem  Acute on chronic combined CHF with abdominal bloating and peripheral edema; likely due to medical noncompliance, cocaine use and dietary indiscretion. Patient has had recurrent admissions since the beginning of this year for the same symptoms, admitted 2/16,  3/16 (twice in a month), 4/16, 5/16 and current admission - 2-D echo in March 2016 had shown EF of 45-50%, diffuse hypokinesis and grade 2 diastolic dysfunction - Continue IV daily diuresis, strict I's and O's and daily weights  - cardiology consult called for recommendations - I discussed in detail with the patient regarding his cocaine use habits leading to decompensated CHF   Active problems Leg pains - will check ABIs, possibly could have peripheral vascular disease  due to #1 and uncontrolled hypertension  Acute COPD exacerbation Placed on scheduled nebs, Solu-Medrol, Symbicort, continue O2  Chronic history of iron deficiency anemia - will check stool occult test, placed on SCDs anemia panel showed iron of 27, ferritin 40, saturation ratio 7  - Outpatient GI workup   Abdominal discomfort- has abdominal bloating likely due to #1, no pain currently  Recent CT scan (on 07/22/14) showed no acute abnormalities, cirrhotic liver with associated small ascites.Likely due to hepatic congestion   Uncontrolled hypertension Likely secondary to nonadherence to medications, Now improving  Polysubstance abuse Reported last cocaine use was 7-10 days ago.Urine drug screen positive for cocaine on 6/12 and has been positive on every admission. Also appears to have pain medication seeking behavior, discontinue morphine   CKD stage III Baseline creatinine about 1.3,  follow renal function closely with IV diuresis.   Seizure disorder Patient was on Dilantin which was discontinued last May, no seizure-like activity. Patient does not drive.  CAD status post CABG Continue aspirin, not on beta blockers secondary to ongoing cocaine abuse.  Cirrhotic liver Noted on previous CT scan from May 2016 Hypoalbuminema at 2.7; INR  wnl at 1.28.  Code Status: Full code   Family Communication: Discussed in detail with the patient, all imaging results, lab results explained to the patient   Disposition Plan: Hopefully next 24-48 hours  Time Spent in minutes 25  minutes  Procedures  None  Consults   None  DVT Prophylaxis   SCD's  Medications  Scheduled Meds: . aspirin EC  81 mg Oral Daily  . budesonide-formoterol  2 puff Inhalation BID  . citalopram  20 mg Oral Daily  . cloNIDine  0.2 mg Oral BID  . divalproex  500 mg Oral BID  . furosemide  80 mg Intravenous BID  . hydrALAZINE  100 mg Oral 3 times per day  . isosorbide mononitrate  60 mg Oral Daily  . lisinopril   10 mg Oral BID  . methylPREDNISolone (SOLU-MEDROL) injection  60 mg Intravenous Q6H  . mupirocin ointment  1 application Nasal BID  . pantoprazole  40 mg Oral Daily  . sodium chloride  3 mL Intravenous Q12H   Continuous Infusions:  PRN Meds:.cyclobenzaprine, HYDROcodone-acetaminophen, ipratropium-albuterol, metoprolol, morphine injection, nitroGLYCERIN   Antibiotics   Anti-infectives    None        Subjective:   Charles [name] was seen and examined today.   appears to be significant comfortable today, no wheezing, significant peripheral edema . Patient denies dizziness, chest pain, shortness of breath, abdominal pain, N/V/D/C, new weakness, numbess, tingling. No acute events overnight.    Objective:   Blood pressure 155/71, pulse 90, temperature 98.5 F (36.9 C), temperature source Oral, resp. rate 18, height 5\' 5"  (1.651 m), weight 73.573 kg (162 lb 3.2 oz), SpO2 98 %.  Wt Readings from Last 3 Encounters:  09/03/14 73.573 kg (162 lb 3.2 oz)  07/26/14 73.8 kg (162 lb 11.2 oz)  07/05/14 76.114 kg (167 lb 12.8 oz)     Intake/Output Summary (Last 24 hours) at 09/03/14 1207 Last data filed at 09/03/14 1015  Gross per 24 hour  Intake   2028 ml  Output   2675 ml  Net   -647 ml    Exam  General: Alert and oriented x 3, NAD  HEENT:  PERRLA, EOMI, Anicteric Sclera, mucous membranes moist.   Neck: Supple, no JVD, no masses  CVS: S1 S2 auscultated, no rubs, murmurs or gallops. Regular rate and rhythm.  Respiratory:  decreased breath sound at the bases otherwise no wheezing today   Abdomen : Soft, nontender, nondistended, + bowel sounds  Ext: no cyanosis clubbing 2-3+ edema, tracking device on the right leg   Neuro: AAOx3, Cr N's II- XII. Strength 5/5 upper and lower extremities bilaterally  Skin: No rashes  Psych: Normal affect and demeanor, alert and oriented x3    Data Review   Micro Results Recent Results (from the past 240 hour(s))  MRSA PCR Screening      Status: Abnormal   Collection Time: 08/30/14 12:59 AM  Result Value Ref Range Status   MRSA by PCR POSITIVE (A) NEGATIVE Final    Comment:        The GeneXpert MRSA Assay (FDA approved for NASAL specimens only), is one component of a comprehensive MRSA colonization surveillance program. It is not intended to diagnose MRSA infection nor to guide or monitor treatment for MRSA infections. RESULT CALLED TO, READ BACK BY AND VERIFIED WITH: HARAWAY,J RN 0424 08/30/14 MITCHELL,L     Radiology Reports Dg Chest 2 View  08/29/2014   CLINICAL DATA:  Fluid build-up at the legs.  Initial encounter.  EXAM: CHEST  2 VIEW  COMPARISON:  Chest radiograph performed 08/27/2014  FINDINGS: The lungs are well-aerated. Vascular congestion is noted, with increased interstitial markings, concerning for mild interstitial edema. No pleural effusion or pneumothorax is seen.  The heart is enlarged. The patient is status post median sternotomy, with evidence of prior CABG. No acute osseous abnormalities are seen.  IMPRESSION: Vascular congestion and cardiomegaly, with increased interstitial markings, concerning for mild interstitial edema.   Electronically Signed   By: Garald Balding M.D.   On: 08/29/2014 19:35    CBC  Recent Labs Lab 08/29/14 1917 08/30/14 0519 08/31/14 0430 09/02/14 0925 09/03/14 0455  WBC 5.0 4.1 4.1 3.8* 9.2  HGB 8.2* 7.9* 7.8* 8.1* 7.5*  HCT 27.1* 26.1* 25.6* 26.6* 24.3*  PLT 259 255 255 272 273  MCV 85.2 85.6 85.6 83.9 82.9  MCH 25.8* 25.9* 26.1 25.6* 25.6*  MCHC 30.3 30.3 30.5 30.5 30.9  RDW 17.4* 17.6* 17.8* 17.0* 16.8*  LYMPHSABS 0.6*  --   --   --   --   MONOABS 0.7  --   --   --   --   EOSABS 0.2  --   --   --   --   BASOSABS 0.0  --   --   --   --     Chemistries   Recent Labs Lab 08/29/14 1917 08/30/14 0519 08/31/14 0430 09/02/14 0259 09/03/14 0455  NA 135 136 139 135 133*  K 4.1 3.6 4.1 3.6 3.8  CL 103 102 104 97* 93*  CO2 22 25 26 30 27   GLUCOSE 77 109*  129* 112* 216*  BUN 29* 28* 25* 27* 38*  CREATININE 1.32* 1.31* 1.39* 1.30* 1.38*  CALCIUM 8.5* 8.4* 8.6* 8.4* 8.4*  MG  --  1.9  --   --   --   AST  --   --  25  --   --   ALT  --   --  13*  --   --   ALKPHOS  --   --  75  --   --   BILITOT  --   --  0.7  --   --    ------------------------------------------------------------------------------------------------------------------ estimated creatinine clearance is 43.3 mL/min (by C-G formula based on Cr of 1.38). ------------------------------------------------------------------------------------------------------------------ No results for input(s): HGBA1C in the last 72 hours. ------------------------------------------------------------------------------------------------------------------ No results for input(s): CHOL, HDL, LDLCALC, TRIG, CHOLHDL, LDLDIRECT in the last 72 hours. ------------------------------------------------------------------------------------------------------------------ No results for input(s): TSH, T4TOTAL, T3FREE, THYROIDAB in the last 72 hours.  Invalid input(s): FREET3 ------------------------------------------------------------------------------------------------------------------  Recent Labs  09/02/14 0925  VITAMINB12 354  FOLATE 11.3  FERRITIN 40  TIBC 402  IRON 27*  RETICCTPCT 2.1    Coagulation profile  Recent Labs Lab 08/31/14 0430  INR 1.28    No results for input(s): DDIMER in the last 72 hours.  Cardiac Enzymes No results for input(s): CKMB, TROPONINI, MYOGLOBIN in the last 168 hours.  Invalid input(s): CK ------------------------------------------------------------------------------------------------------------------ Invalid input(s): POCBNP  No results for input(s): GLUCAP in the last 72 hours.   Hutton Pellicane M.D. [facility] 09/03/2014, 12:07 PM  Pager: [phone]   Between 7am to 7pm - call Pager - [phone]  After 7pm go to [email or web address] - password  [email or web address]  Call night coverage person covering after 7pm

## 2014-09-03 NOTE — Consult Note (Signed)
Advanced Heart Failure Team Consult Note  Referring Physician: RAI, R Primary Physician: No PCP per patient   Reason for Consultation: HF  HPI:    Charles [name] is a 69 y.o. male with history of diastolic CHF (EF 56-43% 05/19/93), polysubstance abuse, CAD, tobacco use, and HTN who presented to [facility] on 08/29/14 with increased peripheral edema and SOB.  His legs were painful.  He has no regular medical follow ups and appears to have no showed to many of his CHF clinic appointments.  He denies CP and DOE.  He states his last use of cocaine was last week, UDS says the same.   He is on the internal medicine service and seems to have had a non eventful stay thus far.  He has diuresed well with 4.8 L out thus far and is down 12 lbs on IV lasix and strict I/Os.  Says he feels much better today than he did on admit.  Still slightly fluid overloaded by exam. Says he would not be able to make it to CHF appointments due to distance and lack of transportation from [facility].    Review of Systems: [y] = yes, [ ]  = no   General: Weight gain [y]; Weight loss [ ] ; Anorexia [ ] ; Fatigue [y]; Fever [ ] ; Chills [ ] ; Weakness [ ]   Cardiac: Chest pain/pressure [ ] ; Resting SOB [ ] ; Exertional SOB [ ] ; Orthopnea [y]; Pedal Edema [y]; Palpitations [ ] ; Syncope [ ] ; Presyncope [ ] ; Paroxysmal nocturnal dyspnea[ ]   Pulmonary: Cough [ ] ; Wheezing[ ] ; Hemoptysis[ ] ; Sputum [ ] ; Snoring [ ]   GI: Vomiting[ ] ; Dysphagia[ ] ; Melena[ ] ; Hematochezia [ ] ; Heartburn[ ] ; Abdominal pain [ ] ; Constipation [ ] ; Diarrhea [ ] ; BRBPR [ ]   GU: Hematuria[ ] ; Dysuria [ ] ; Nocturia[ ]   Vascular: Pain in legs with walking [ ] ; Pain in feet with lying flat [ ] ; Non-healing sores [ ] ; Stroke [ ] ; TIA [ ] ; Slurred speech [ ] ;  Neuro: Headaches[ ] ; Vertigo[ ] ; Seizures[ ] ; Paresthesias[ ] ;Blurred vision [ ] ; Diplopia [ ] ; Vision changes [ ]   Ortho/Skin: Arthritis [ ] ; Joint pain [ ] ; Muscle pain [ ] ; Joint swelling [ ] ; Back Pain [ ] ; Rash  [ ]   Psych: Depression[ ] ; Anxiety[ ]   Heme: Bleeding problems [ ] ; Clotting disorders [ ] ; Anemia [ ]   Endocrine: Diabetes [ ] ; Thyroid dysfunction[ ]   Home Medications Prior to Admission medications   Medication Sig Start Date End Date Taking? Authorizing Provider  albuterol (PROVENTIL HFA;VENTOLIN HFA) 108 (90 BASE) MCG/ACT inhaler Inhale 2 puffs into the lungs every 6 (six) hours as needed for wheezing or shortness of breath. 04/30/14  Yes Thurnell Lose, MD  aspirin 81 MG tablet Take 81 mg by mouth daily.   Yes Historical Provider, MD  citalopram (CELEXA) 20 MG tablet Take 20 mg by mouth daily.   Yes Historical Provider, MD  cloNIDine (CATAPRES) 0.2 MG tablet Take 1 tablet (0.2 mg total) by mouth 2 (two) times daily. 07/26/14  Yes Modena Jansky, MD  cyclobenzaprine (FLEXERIL) 5 MG tablet Take 1 tablet (5 mg total) by mouth 3 (three) times daily as needed for muscle spasms (chest and leg pains). 07/05/14  Yes Donne Hazel, MD  divalproex (DEPAKOTE ER) 500 MG 24 hr tablet Take 500 mg by mouth 2 (two) times daily. 05/29/14  Yes Historical Provider, MD  ferrous sulfate 325 (65 FE) MG tablet Take 325 mg by mouth 3 (  three) times daily with meals.    Yes Historical Provider, MD  furosemide (LASIX) 40 MG tablet Take 1 tablet (40 mg total) by mouth 2 (two) times daily. 07/26/14  Yes Modena Jansky, MD  hydrALAZINE (APRESOLINE) 100 MG tablet Take 100 mg by mouth 3 (three) times daily. 05/29/14  Yes Historical Provider, MD  isosorbide mononitrate (IMDUR) 30 MG 24 hr tablet Take 2 tablets (60 mg total) by mouth daily. 07/26/14  Yes Modena Jansky, MD  lisinopril (PRINIVIL,ZESTRIL) 20 MG tablet Take 0.5 tablets (10 mg total) by mouth 2 (two) times daily. 07/26/14  Yes Modena Jansky, MD  pantoprazole (PROTONIX) 40 MG tablet Take 40 mg by mouth daily.    Yes Historical Provider, MD  potassium chloride SA (KLOR-CON M20) 20 MEQ tablet Take 20 mEq by mouth daily. 01/17/10  Yes Historical Provider, MD   nitroGLYCERIN (NITROSTAT) 0.4 MG SL tablet Place 1 tablet (0.4 mg total) under the tongue every 5 (five) minutes as needed for chest pain (upto max 3 doses at one time.). 07/26/14   Modena Jansky, MD    Past Medical History: Past Medical History  Diagnosis Date  . Iron deficiency anemia   . Chronic combined systolic and diastolic CHF, NYHA class 2     a. 11/2013 Echo: EF 40-45%, basl-mid inflat AK, Gr 2 DD.  [name] Hyperlipidemia LDL goal <70   . Obstructive sleep apnea     a. not on home CPAP  . Ischemic cardiomyopathy     a. 11/2013 Echo: EF 40-45%, basl-mid inflat AK, Gr 2 DD, mild AI, mod dil LA/RA, mod reduced RV fxn, PASP 53mmHg.  [name] Homelessness   . Moderate to severe pulmonary hypertension 03/2010    a. PA peak pressure of 76 mmHg (per 2D Echo 03/2010)  . Polysubstance abuse     a. cocaine, THC  . AV malformation of gastrointestinal tract     a. w/ h/o GIB.  [name] Family history of early CAD   . DDD (degenerative disc disease), lumbar   . Peripheral vascular disease   . Seizure disorder   . Hypertension   . CAD (coronary artery disease)     a. s/p 3-vessel CABG (12/2007) // 100% RCA stenosis with collaterals from left system. Severe bifurcation lesions of proxima CXA and OM. Moderate LAD disease - followed by Dr. Wyline Copas in [facility];  b. 11/2013 Myoview: Large fixed inf defect w/o ischemia, EF 35%.  . Diabetes   . Chronic kidney disease (CKD), stage III (moderate)     a. BL SCr 1.5-1.6  . Renal artery stenosis   . Seizures     Past Surgical History: Past Surgical History  Procedure Laterality Date  . Apc  03/2010    To treat small bowel AVMs  . Esophagogastroduodenoscopy N/A 08/14/2012    Procedure: ESOPHAGOGASTRODUODENOSCOPY (EGD);  Surgeon: [name], MD;  Location: [facility] ENDOSCOPY;  Service: Endoscopy;  Laterality: N/A;  . Hot hemostasis N/A 08/14/2012    Procedure: HOT HEMOSTASIS (ARGON PLASMA COAGULATION/BICAP);  Surgeon: [name], MD;  Location[facility] ENDOSCOPY;  Service:  Endoscopy;  Laterality: N/A;  . Esophagogastroduodenoscopy (egd) with propofol N/A 08/04/2013    Procedure: ESOPHAGOGASTRODUODENOSCOPY (EGD) WITH PROPOFOL;  Surgeon: [name], MD;  Location: [facility] ENDOSCOPY;  Service: Endoscopy;  Laterality: N/A;  . Coronary artery bypass graft  12/2007  . Esophagogastroduodenoscopy (egd) with propofol N/A 06/08/2014    Procedure: ESOPHAGOGASTRODUODENOSCOPY (EGD) WITH PROPOFOL;  Surgeon: Milus Banister, MD;  Location[facility]  ENDOSCOPY;  Service: Endoscopy;  Laterality: N/A;    Family History: Family History  Problem Relation Age of Onset  . Heart disease Mother     unknown type  . Heart disease Father 44    died of MI at 68yo  . Heart disease Paternal Grandfather 76    died of MI  . Heart disease    . Heart disease Brother 26    Social History: History   Social History  . Marital Status: Widowed    Spouse Name: N/A  . Number of Children: 2  . Years of Education: N/A   Occupational History  . Unemployed     previously worked in [employer]  .     Social History Main Topics  . Smoking status: Current Some Day Smoker -- 0.25 packs/day for 56 years    Types: Cigarettes  . Smokeless tobacco: Never Used  . Alcohol Use: 0.0 oz/week    0 Standard drinks or equivalent per week     Comment: occasionally drinks, a few times a month  . Drug Use: 1.00 per week    Special: Cocaine, Marijuana     Comment: uses cocaine once/wk at least.  Last used on Monday, May 30th  . Sexual Activity: No     Comment: pt states "I smoked a little small piece on 02/25/14   Other Topics Concern  . None   Social History Narrative   Lives in [place]. Originally from [place], has been in the [place] since 1980s      **11/2013 - says that he lives with dtr in [facility].**                      Allergies:  Allergies  Allergen Reactions  . Motrin [Ibuprofen] Other (See Comments)    Affects kidneys  . Tylenol [Acetaminophen] Other (See  Comments)    Affects kidneys    Objective:    Vital Signs:   Temp:  [98.2 F (36.8 C)-98.5 F (36.9 C)] 98.5 F (36.9 C) (06/16 0827) Pulse Rate:  [90-98] 90 (06/16 0827) Resp:  [17-18] 18 (06/16 0827) BP: (140-155)/(71-75) 155/71 mmHg (06/16 0827) SpO2:  [98 %-100 %] 98 % (06/16 0827) Weight:  [162 lb 3.2 oz (73.573 kg)] 162 lb 3.2 oz (73.573 kg) (06/16 0528) Last BM Date: 09/03/14  Weight change: Filed Weights   09/01/14 0602 09/02/14 0545 09/03/14 0528  Weight: 176 lb 3.2 oz (79.924 kg) 166 lb 4.8 oz (75.433 kg) 162 lb 3.2 oz (73.573 kg)    Intake/Output:   Intake/Output Summary (Last 24 hours) at 09/03/14 1237 Last data filed at 09/03/14 1015  Gross per 24 hour  Intake   2028 ml  Output   2675 ml  Net   -647 ml     Physical Exam: General:  Well appearing. No resp difficulty HEENT: normal Neck: supple. JVP . Carotids 2+ bilat; no bruits. No lymphadenopathy or thryomegaly appreciated. Cor: PMI nondisplaced. Regular rate & rhythm. No rubs, gallops or murmurs. Lungs: clear Abdomen: soft, nontender, nondistended. No hepatosplenomegaly. No bruits or masses. Good bowel sounds. Extremities: no cyanosis, clubbing, rash, edema Neuro: alert & orientedx3, cranial nerves grossly intact. moves all 4 extremities w/o difficulty. Affect pleasant  Telemetry:  NSR 90s  Labs: Basic Metabolic Panel:  Recent Labs Lab 08/29/14 1917 08/30/14 0519 08/31/14 0430 09/02/14 0259 09/03/14 0455  NA 135 136 139 135 133*  K 4.1 3.6 4.1 3.6 3.8  CL 103 102 104  97* 93*  CO2 22 25 26 30 27   GLUCOSE 77 109* 129* 112* 216*  BUN 29* 28* 25* 27* 38*  CREATININE 1.32* 1.31* 1.39* 1.30* 1.38*  CALCIUM 8.5* 8.4* 8.6* 8.4* 8.4*  MG  --  1.9  --   --   --   PHOS  --  3.3  --  3.5 3.1    Liver Function Tests:  Recent Labs Lab 08/31/14 0430 09/02/14 0259 09/03/14 0455  AST 25  --   --   ALT 13*  --   --   ALKPHOS 75  --   --   BILITOT 0.7  --   --   PROT 6.8  --   --   ALBUMIN  2.7* 2.5* 2.6*   No results for input(s): LIPASE, AMYLASE in the last 168 hours. No results for input(s): AMMONIA in the last 168 hours.  CBC:  Recent Labs Lab 08/29/14 1917 08/30/14 0519 08/31/14 0430 09/02/14 0925 09/03/14 0455  WBC 5.0 4.1 4.1 3.8* 9.2  NEUTROABS 3.5  --   --   --   --   HGB 8.2* 7.9* 7.8* 8.1* 7.5*  HCT 27.1* 26.1* 25.6* 26.6* 24.3*  MCV 85.2 85.6 85.6 83.9 82.9  PLT 259 255 255 272 273    Cardiac Enzymes: No results for input(s): CKTOTAL, CKMB, CKMBINDEX, TROPONINI in the last 168 hours.  BNP: BNP (last 3 results)  Recent Labs  07/24/14 0348 08/29/14 1917 08/30/14 0517  BNP 1075.1* 3845.1* 3766.5*    ProBNP (last 3 results)  Recent Labs  01/01/14 1107 02/07/14 0123 03/01/14 2250  PROBNP 3225.0* 2648.0* 7421.0*     CBG: No results for input(s): GLUCAP in the last 168 hours.  Coagulation Studies: No results for input(s): LABPROT, INR in the last 72 hours.  Other results:  Imaging: No results found.  Echo 05/22/14  LV EF: 45% -  50%  ------------------------------------------------------------------- Indications:   CHF - 428.0.  ------------------------------------------------------------------- History:  PMH: Polysubstance abuse. Chest pain. Coronary artery disease. Risk factors: Current tobacco use. Hypertension.  ------------------------------------------------------------------- Study Conclusions  - Left ventricle: The cavity size was mildly dilated. Wall thickness was increased in a pattern of mild LVH. Systolic function was mildly reduced. The estimated ejection fraction was in the range of 45% to 50%. Diffuse hypokinesis. Features are consistent with a pseudonormal left ventricular filling pattern, with concomitant abnormal relaxation and increased filling pressure (grade 2 diastolic dysfunction). - Aortic valve: There was trivial regurgitation. - Left atrium: The atrium was moderately to  severely dilated. - Right atrium: The atrium was moderately to severely dilated. - Pulmonary arteries: PA peak pressure: 36 mm Hg (S).  Medications:     Current Medications: . aspirin EC  81 mg Oral Daily  . budesonide-formoterol  2 puff Inhalation BID  . citalopram  20 mg Oral Daily  . cloNIDine  0.2 mg Oral BID  . divalproex  500 mg Oral BID  . furosemide  80 mg Intravenous BID  . hydrALAZINE  100 mg Oral 3 times per day  . isosorbide mononitrate  60 mg Oral Daily  . lisinopril  10 mg Oral BID  . methylPREDNISolone (SOLU-MEDROL) injection  60 mg Intravenous Q12H  . mupirocin ointment  1 application Nasal BID  . pantoprazole  40 mg Oral Daily  . sodium chloride  3 mL Intravenous Q12H    Infusions:     Assessment:   1. Acute on chronic diastolic CHF  - EF 85-27% 05/22/14, full report  above. - Non compliance major issue - Says he has no transportation for HF clinic, lives in [place].  2. Acute COPD exacerbation  - Symbicort, duonebs as needed. - per primary team  3. IDA  - Chronic.  Iron stores low.  Consider Ferraheme  4. Abdominal pain - CT scan 07/22/14 unremarkable.   - Cirrhosis, likely 2/2 hepatic congestion.  5. Uncontrolled HTN  - non compliance large factor - On clonidine, hydralazine, imdur, lisinopril, and metoprolol  6. Polysubstance abuse  - last used cocaine 7-10 days ago, UDS positive for cocaine and opiates  7. CAD  - hx of 3 vessel CABG 12/2007 - ABI for today, having leg pain, ? PAD  8. Cirrhosis  - HCV negative  Internal medicine team doing well with diuresis thus far, Creatine stable and down 12 lbs. Pt will need follow up with HF clinic, but compliance a major issue. The HF clinic has provided assistance and special arrangements for transport but pt never attended his appointments, via no show or cancellation. Consider an extra 40 mg IV lasix today, +/- metolazone. Will consult MD about optimal diuresis.   Little for [place] to add at this  point.   Length of Stay: [place] 09/03/2014, 12:37 PM  Advanced Heart Failure Team Pager [phone] (M-F; 7a - 4p)  Please contact [facility] for night-coverage after hours (4p -7a ) and weekends on [email or web address]  Patient seen and examined with Oda Kilts, PA-C. We discussed all aspects of the encounter. I agree with the assessment and plan as stated above.   Charles [name] is well known to the HF team. He has diastolic HF. His management has been complicated by drug abuse, severe noncompliance and unwillingness to f/u as an outpatient. His weight now is as low as it has been in a while but he still has marked volume overload. Would continue IV diuresis. I had a long talk with him about his noncompliance and our inability to track him down. I will meet with Charles [name] and our HF Navigator to see if there is anything else we can do to reduce his readmission rate.   Bensimhon, Daniel,MD 5:11 PM

## 2014-09-03 NOTE — Progress Notes (Signed)
VASCULAR LAB PRELIMINARY  ARTERIAL  ABI completed: Findings are consistent with severe arterial disease in both lower extremities.    RIGHT    LEFT    PRESSURE WAVEFORM  PRESSURE WAVEFORM  BRACHIAL 152 Triphasic BRACHIAL 156 Triphasic  DP 66 monophasic DP 35 monophasic  AT   AT    PT 38 monophasic PT 0 absent    RIGHT LEFT  ABI  0.42 0.22     Armenta Erskin, Bonnye Fava, RVT 09/03/2014, 4:37 PM

## 2014-09-03 NOTE — Progress Notes (Signed)
Inpatient Diabetes Program Recommendations  AACE/ADA: New Consensus Statement on Inpatient Glycemic Control (2013)  Target Ranges:  Prepandial:   less than 140 mg/dL      Peak postprandial:   less than 180 mg/dL (1-2 hours)      Critically ill patients:  140 - 180 mg/dL   Recommend monitoring CBGs during steroid therapy and add Novolog sensitive scale TID+HS if needed. Thank you  Raoul Pitch BSN, RN,CDE Inpatient Diabetes Coordinator [phone] (team pager)
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
## 2014-09-03 NOTE — Progress Notes (Signed)
UR COMPLETED  

## 2014-09-04 DIAGNOSIS — I70223 Atherosclerosis of native arteries of extremities with rest pain, bilateral legs: Secondary | ICD-10-CM

## 2014-09-04 LAB — CBC
HCT: 25.5 % — ABNORMAL LOW (ref 39.0–52.0)
HEMOGLOBIN: 7.8 g/dL — AB (ref 13.0–17.0)
MCH: 25.8 pg — AB (ref 26.0–34.0)
MCHC: 30.6 g/dL (ref 30.0–36.0)
MCV: 84.4 fL (ref 78.0–100.0)
PLATELETS: 280 10*3/uL (ref 150–400)
RBC: 3.02 MIL/uL — AB (ref 4.22–5.81)
RDW: 16.7 % — ABNORMAL HIGH (ref 11.5–15.5)
WBC: 17.1 10*3/uL — ABNORMAL HIGH (ref 4.0–10.5)

## 2014-09-04 LAB — RENAL FUNCTION PANEL
ALBUMIN: 3 g/dL — AB (ref 3.5–5.0)
Anion gap: 11 (ref 5–15)
BUN: 52 mg/dL — AB (ref 6–20)
CALCIUM: 8.7 mg/dL — AB (ref 8.9–10.3)
CO2: 29 mmol/L (ref 22–32)
CREATININE: 1.4 mg/dL — AB (ref 0.61–1.24)
Chloride: 94 mmol/L — ABNORMAL LOW (ref 101–111)
GFR calc Af Amer: 57 mL/min — ABNORMAL LOW (ref >60)
GFR, EST NON AFRICAN AMERICAN: 49 mL/min — AB (ref >60)
Glucose, Bld: 150 mg/dL — ABNORMAL HIGH (ref 65–99)
PHOSPHORUS: 2.7 mg/dL (ref 2.5–4.6)
Potassium: 3.8 mmol/L (ref 3.5–5.1)
Sodium: 134 mmol/L — ABNORMAL LOW (ref 135–145)

## 2014-09-04 MED ORDER — PREDNISONE 20 MG PO TABS
40.0000 mg | ORAL_TABLET | Freq: Every day | ORAL | Status: DC
  Filled 2014-09-04 (×2): qty 2

## 2014-09-04 MED ORDER — POTASSIUM CHLORIDE CRYS ER 20 MEQ PO TBCR
40.0000 meq | EXTENDED_RELEASE_TABLET | Freq: Once | ORAL | Status: AC
  Administered 2014-09-04: 40 meq via ORAL
  Filled 2014-09-04: qty 2

## 2014-09-04 MED ORDER — FUROSEMIDE 40 MG PO TABS
40.0000 mg | ORAL_TABLET | Freq: Two times a day (BID) | ORAL | Status: DC
  Administered 2014-09-04 – 2014-09-06 (×6): 40 mg via ORAL
  Filled 2014-09-04 (×9): qty 1

## 2014-09-04 NOTE — Progress Notes (Signed)
Heart Failure Navigator Consult Note  Presentation: Charles [name] is a 69 y.o. male with history of diastolic CHF (EF 78-29% 07/24/19), polysubstance abuse, CAD, tobacco use, and HTN who presented to [facility] on 08/29/14 with increased peripheral edema and SOB.  Past Medical History  Diagnosis Date  . Iron deficiency anemia   . Chronic combined systolic and diastolic CHF, NYHA class 2     a. 11/2013 Echo: EF 40-45%, basl-mid inflat AK, Gr 2 DD.  [name] Hyperlipidemia LDL goal <70   . Obstructive sleep apnea     a. not on home CPAP  . Ischemic cardiomyopathy     a. 11/2013 Echo: EF 40-45%, basl-mid inflat AK, Gr 2 DD, mild AI, mod dil LA/RA, mod reduced RV fxn, PASP 54mmHg.  [name] Homelessness   . Moderate to severe pulmonary hypertension 03/2010    a. PA peak pressure of 76 mmHg (per 2D Echo 03/2010)  . Polysubstance abuse     a. cocaine, THC  . AV malformation of gastrointestinal tract     a. w/ h/o GIB.  [name] Family history of early CAD   . DDD (degenerative disc disease), lumbar   . Peripheral vascular disease   . Seizure disorder   . Hypertension   . CAD (coronary artery disease)     a. s/p 3-vessel CABG (12/2007) // 100% RCA stenosis with collaterals from left system. Severe bifurcation lesions of proxima CXA and OM. Moderate LAD disease - followed by Dr. Wyline Copas in [facility];  b. 11/2013 Myoview: Large fixed inf defect w/o ischemia, EF 35%.  . Diabetes   . Chronic kidney disease (CKD), stage III (moderate)     a. BL SCr 1.5-1.6  . Renal artery stenosis   . Seizures     History   Social History  . Marital Status: Widowed    Spouse Name: N/A  . Number of Children: 2  . Years of Education: N/A   Occupational History  . Unemployed     previously worked in [employer]  .     Social History Main Topics  . Smoking status: Current Some Day Smoker -- 0.25 packs/day for 56 years    Types: Cigarettes  . Smokeless tobacco: Never Used  . Alcohol Use: 0.0 oz/week    0 Standard drinks or  equivalent per week     Comment: occasionally drinks, a few times a month  . Drug Use: 1.00 per week    Special: Cocaine, Marijuana     Comment: uses cocaine once/wk at least.  Last used on Monday, May 30th  . Sexual Activity: No     Comment: pt states "I smoked a little small piece on 02/25/14   Other Topics Concern  . None   Social History Narrative   Lives in [place]. Originally from [place], has been in the [place] since 1980s      **11/2013 - says that he lives with dtr in [facility]                      ECHO:Study Conclusions--05/22/14  - Left ventricle: The cavity size was mildly dilated. Wall thickness was increased in a pattern of mild LVH. Systolic function was mildly reduced. The estimated ejection fraction was in the range of 45% to 50%. Diffuse hypokinesis. Features are consistent with a pseudonormal left ventricular filling pattern, with concomitant abnormal relaxation and increased filling pressure (grade 2 diastolic dysfunction). - Aortic valve: There was trivial regurgitation. - Left  atrium: The atrium was moderately to severely dilated. - Right atrium: The atrium was moderately to severely dilated. - Pulmonary arteries: PA peak pressure: 36 mm Hg (S).  ------------------------------------------------------------------- Labs, prior tests, procedures, and surgery: Coronary artery bypass grafting.  Transthoracic echocardiography. M-mode, complete 2D, spectral Doppler, and color Doppler. Birthdate: Patient birthdate: 06/15/1945. Age: Patient is 69 yr old. Sex: Gender: male. BMI: 29.6 kg/m^2. Blood pressure:   143/64 Patient status: Inpatient. Study date: Study date: 05/22/2014. Study time: 03:41 PM. Location: Bedside.  BNP    Component Value Date/Time   BNP 3766.5* 08/30/2014 0517    ProBNP    Component Value Date/Time   PROBNP 7421.0* 03/01/2014 2250     Education Assessment and Provision:  Detailed  education and instructions provided on heart failure disease management including the following:  Signs and symptoms of Heart Failure When to call the physician Importance of daily weights Low sodium diet Fluid restriction Medication management Anticipated future follow-up appointments  Patient education given on each of the above topics.  Patient acknowledges understanding and acceptance of all instructions.  I spoke with Mr. Omdahl regarding his HF.  He is known to me from previous visits.  He has been referred to HF team before and does not come for follow-up.  I have set a follow-up appt for him on 09/14/15 at 0930 am and he has agreed to come if I send a cab for him.  He tells me that the scale I provided in a previous admission was "left at his old address".  I have provided a new scale and I have reinforced the importance of daily weights and how they relate to signs and symptoms of HF.  He agrees to weigh daily, record and call if needed with weight gains.  He admits that his living situation is not optimal for his health.  He is surrounded by drug use and finds it next to impossible to abstain.  I have emphasized how important that is for his ongoing health.    Education Materials:  "Living Better With Heart Failure" Booklet, Daily Weight Tracker Tool .   High Risk Criteria for Readmission and/or Poor Patient Outcomes:  (Recommend Follow-up with Advanced Heart Failure Clinic)--yes   EF <30%- No-45-50% and grade 2 dias dys  2 or more admissions in 6 months- yes 6 adm in 6 months  Difficult social situation- yes  Demonstrates medication noncompliance- no?   Barriers of Care:  Living situation, polysubstance abuse , knowledge and compliance.  Discharge Planning:   Plans to return to home in [place] with "daughter".

## 2014-09-04 NOTE — Progress Notes (Signed)
[facility]                                                                              Patient Demographics  Charles [name], is a 69 y.o. male, DOB - 1946/03/06, JME:[id number]  Admit date - 08/29/2014   Admitting Physician Quintella Baton, MD  Outpatient Primary MD for the patient is No primary care provider on file.  LOS - 5   Chief Complaint  Patient presents with  . Leg Swelling       Brief HPI   Patient is a 69 year old male with chronic combined systolic and diastolic CHF, known history of iron deficiency anemia, hyperlipidemia, moderate to severe pulmonary hypertension, polysubstance abuse, seizure disorder, CAD, hypertension, seizures presented with leg swelling and pain. Over the last 3 days he's developed increasing swelling on his bilateral legs accompanied by pain. He complained of some SOB but the pain in his legs take precedence. He stated he has occasional legs pains but this is much worse. He has not seen his PCP who he states has retired. He denied wheezing or chest pain. He states he is compliant with his medication but his pain now causes his blood pressure to be uncontrolled.     Assessment & Plan    Principal problem  Acute on chronic combined CHF with abdominal bloating and peripheral edema; likely due to medical noncompliance, cocaine use and dietary indiscretion. Patient has had recurrent admissions since the beginning of this year for the same symptoms, admitted 2/16,  3/16 (twice in a month), 4/16, 5/16 and current admission - 2-D echo in March 2016 had shown EF of 45-50%, diffuse hypokinesis and grade 2 diastolic dysfunction - Transitioned to oral Lasix today, diuresing well, negative balance of 4.6 L -Appreciate cardiology recommendations - I discussed in detail with the patient regarding his cocaine use habits leading to decompensated CHF, patient agrees to cut down   Active problems Leg pains with severe peripheral vascular  disease - ABI's show severe peripheral vascular disease, right 0.42 and left 0.22 - Called vascular surgery consult   Acute COPD exacerbation Significant improvement, continue Symbicort, DuoNeb's, transitioned to oral prednisone   Chronic history of iron deficiency anemia - placed on SCDs anemia panel showed iron of 27, ferritin 40, saturation ratio 7  - Outpatient GI workup - Transfuse for hemoglobin less than 7.5   Abdominal discomfort- has abdominal bloating likely due to #1, no pain currently  Recent CT scan (on 07/22/14) showed no acute abnormalities, cirrhotic liver with associated small ascites.Likely due to hepatic congestion   Uncontrolled hypertension Likely secondary to nonadherence to medications, Now improving  Polysubstance abuse Reported last cocaine use was 7-10 days ago.Urine drug screen positive for cocaine on 6/12 and has been positive on every admission. Also appears to have pain medication seeking behavior, discontinue morphine   CKD stage III Baseline creatinine about 1.3,   Creatinine trending up to 1.4, Lasix transitioned to oral today  Seizure disorder Patient was on Dilantin which was discontinued last May, no seizure-like activity. Patient does not drive.  CAD status post CABG Continue aspirin, not on beta blockers secondary to ongoing cocaine  abuse.  Cirrhotic liver Noted on previous CT scan from May 2016 Hypoalbuminema at 2.7; INR wnl at 1.28.  Code Status: Full code   Family Communication: Discussed in detail with the patient, all imaging results, lab results explained to the patient   Disposition Plan: Pending vascular surgery recommendations  Time Spent in minutes 25  minutes  Procedures  None  Consults   None  DVT Prophylaxis   SCD's  Medications  Scheduled Meds: . aspirin EC  81 mg Oral Daily  . budesonide-formoterol  2 puff Inhalation BID  . citalopram  20 mg Oral Daily  . cloNIDine  0.2 mg Oral BID  . divalproex  500 mg Oral  BID  . ferumoxytol  510 mg Intravenous Weekly  . furosemide  40 mg Oral BID  . hydrALAZINE  100 mg Oral 3 times per day  . isosorbide mononitrate  60 mg Oral Daily  . lisinopril  10 mg Oral BID  . methylPREDNISolone (SOLU-MEDROL) injection  60 mg Intravenous Q12H  . pantoprazole  40 mg Oral Daily  . sodium chloride  3 mL Intravenous Q12H   Continuous Infusions:  PRN Meds:.cyclobenzaprine, HYDROcodone-acetaminophen, ipratropium-albuterol, metoprolol, nitroGLYCERIN   Antibiotics   Anti-infectives    None        Subjective:   Charles [name] was seen and examined today. No acute complaints, feels pain in his legs, chronic. No chest pain. Peripheral edema significantly improved . Patient denies dizziness, chest pain, shortness of breath, abdominal pain, N/V/D/C, new weakness, numbess, tingling. No acute events overnight.    Objective:   Blood pressure 130/70, pulse 85, temperature 97.6 F (36.4 C), temperature source Oral, resp. rate 18, height 5\' 5"  (1.651 m), weight 74.072 kg (163 lb 4.8 oz), SpO2 100 %.  Wt Readings from Last 3 Encounters:  09/04/14 74.072 kg (163 lb 4.8 oz)  07/26/14 73.8 kg (162 lb 11.2 oz)  07/05/14 76.114 kg (167 lb 12.8 oz)     Intake/Output Summary (Last 24 hours) at 09/04/14 1143 Last data filed at 09/04/14 0933  Gross per 24 hour  Intake   1380 ml  Output   1225 ml  Net    155 ml    Exam  General: Alert and oriented x 3, NAD  HEENT:  PERRLA, EOMI,  Neck: Supple, no JVD, no masses  CVS: S1 S2 auscultated, RRR  Respiratory: Clear to solution bilaterally   Abdomen : Soft, nontender, nondistended, + bowel sounds  Ext: no cyanosis clubbing, no edema, tracking device on the right leg   Neuro: no new deficits.   Skin: No rashes  Psych: Normal affect and demeanor, alert and oriented x3    Data Review   Micro Results Recent Results (from the past 240 hour(s))  MRSA PCR Screening     Status: Abnormal   Collection Time: 08/30/14  12:59 AM  Result Value Ref Range Status   MRSA by PCR POSITIVE (A) NEGATIVE Final    Comment:        The GeneXpert MRSA Assay (FDA approved for NASAL specimens only), is one component of a comprehensive MRSA colonization surveillance program. It is not intended to diagnose MRSA infection nor to guide or monitor treatment for MRSA infections. RESULT CALLED TO, READ BACK BY AND VERIFIED WITH: HARAWAY,J RN 0424 08/30/14 MITCHELL,L     Radiology Reports Dg Chest 2 View  08/29/2014   CLINICAL DATA:  Fluid build-up at the legs.  Initial encounter.  EXAM: CHEST  2 VIEW  COMPARISON:  Chest radiograph performed 08/27/2014  FINDINGS: The lungs are well-aerated. Vascular congestion is noted, with increased interstitial markings, concerning for mild interstitial edema. No pleural effusion or pneumothorax is seen.  The heart is enlarged. The patient is status post median sternotomy, with evidence of prior CABG. No acute osseous abnormalities are seen.  IMPRESSION: Vascular congestion and cardiomegaly, with increased interstitial markings, concerning for mild interstitial edema.   Electronically Signed   By: Garald Balding M.D.   On: 08/29/2014 19:35    CBC  Recent Labs Lab 08/29/14 1917 08/30/14 0519 08/31/14 0430 09/02/14 0925 09/03/14 0455 09/04/14 0411  WBC 5.0 4.1 4.1 3.8* 9.2 17.1*  HGB 8.2* 7.9* 7.8* 8.1* 7.5* 7.8*  HCT 27.1* 26.1* 25.6* 26.6* 24.3* 25.5*  PLT 259 255 255 272 273 280  MCV 85.2 85.6 85.6 83.9 82.9 84.4  MCH 25.8* 25.9* 26.1 25.6* 25.6* 25.8*  MCHC 30.3 30.3 30.5 30.5 30.9 30.6  RDW 17.4* 17.6* 17.8* 17.0* 16.8* 16.7*  LYMPHSABS 0.6*  --   --   --   --   --   MONOABS 0.7  --   --   --   --   --   EOSABS 0.2  --   --   --   --   --   BASOSABS 0.0  --   --   --   --   --     Chemistries   Recent Labs Lab 08/30/14 0519 08/31/14 0430 09/02/14 0259 09/03/14 0455 09/04/14 0411  NA 136 139 135 133* 134*  K 3.6 4.1 3.6 3.8 3.8  CL 102 104 97* 93* 94*  CO2 25  26 30 27 29   GLUCOSE 109* 129* 112* 216* 150*  BUN 28* 25* 27* 38* 52*  CREATININE 1.31* 1.39* 1.30* 1.38* 1.40*  CALCIUM 8.4* 8.6* 8.4* 8.4* 8.7*  MG 1.9  --   --   --   --   AST  --  25  --   --   --   ALT  --  13*  --   --   --   ALKPHOS  --  75  --   --   --   BILITOT  --  0.7  --   --   --    ------------------------------------------------------------------------------------------------------------------ estimated creatinine clearance is 46.2 mL/min (by C-G formula based on Cr of 1.4). ------------------------------------------------------------------------------------------------------------------ No results for input(s): HGBA1C in the last 72 hours. ------------------------------------------------------------------------------------------------------------------ No results for input(s): CHOL, HDL, LDLCALC, TRIG, CHOLHDL, LDLDIRECT in the last 72 hours. ------------------------------------------------------------------------------------------------------------------ No results for input(s): TSH, T4TOTAL, T3FREE, THYROIDAB in the last 72 hours.  Invalid input(s): FREET3 ------------------------------------------------------------------------------------------------------------------  Recent Labs  09/02/14 0925  VITAMINB12 354  FOLATE 11.3  FERRITIN 40  TIBC 402  IRON 27*  RETICCTPCT 2.1    Coagulation profile  Recent Labs Lab 08/31/14 0430  INR 1.28    No results for input(s): DDIMER in the last 72 hours.  Cardiac Enzymes No results for input(s): CKMB, TROPONINI, MYOGLOBIN in the last 168 hours.  Invalid input(s): CK ------------------------------------------------------------------------------------------------------------------ Invalid input(s): POCBNP  No results for input(s): GLUCAP in the last 72 hours.   RAI,RIPUDEEP M.D. [facility] 09/04/2014, 11:43 AM  Pager: [phone]   Between 7am to 7pm - call Pager - [phone]  After 7pm go to  [email or web address] - password [email or web address]  Call night coverage person covering after 7pm

## 2014-09-04 NOTE — Progress Notes (Signed)
Advanced Heart Failure Rounding Note  PCP: None  Subjective:    Charles [name] is a 69 y.o. male with history of diastolic CHF (EF 58-09% 11/26/31), polysubstance abuse, CAD, tobacco use, and HTN who presented to [facility] on 08/29/14 with increased peripheral edema and SOB.  He is feeling better since admit.  Says US showed poor LE circulation yesterday and he describes claudication symptoms. Asks how long he'll be in hospital.  Objective:   Weight Range: 163 lb 4.8 oz (74.072 kg) Body mass index is 27.17 kg/(m^2).   Vital Signs:   Temp:  [97.6 F (36.4 C)-98.7 F (37.1 C)] 97.6 F (36.4 C) (06/17 0500) Pulse Rate:  [81-98] 86 (06/17 0500) Resp:  [18-19] 18 (06/17 0500) BP: (129-163)/(68-94) 163/94 mmHg (06/17 0500) SpO2:  [94 %-100 %] 100 % (06/17 0500) Weight:  [163 lb 4.8 oz (74.072 kg)] 163 lb 4.8 oz (74.072 kg) (06/17 0500) Last BM Date: 09/04/14  Weight change: Filed Weights   09/02/14 0545 09/03/14 0528 09/04/14 0500  Weight: 166 lb 4.8 oz (75.433 kg) 162 lb 3.2 oz (73.573 kg) 163 lb 4.8 oz (74.072 kg)    Intake/Output:   Intake/Output Summary (Last 24 hours) at 09/04/14 0846 Last data filed at 09/04/14 0603  Gross per 24 hour  Intake   1580 ml  Output   1575 ml  Net      5 ml     Physical Exam: General: Well appearing. No resp difficulty HEENT: normal Neck: supple. JVP 9. Carotids 2+ bilat; no bruits. No lymphadenopathy or thryomegaly appreciated. Cor: PMI nondisplaced. Regular rate & rhythm. No rubs, gallops or murmurs. Lungs: clear Abdomen: soft, nontender, nondistended. No hepatosplenomegaly. No bruits or masses. Good bowel sounds. Extremities: no cyanosis, clubbing, rash. trace LE edema Neuro: alert & orientedx3, cranial nerves grossly intact. moves all 4 extremities w/o difficulty. Affect pleasant   Telemetry: NSR 80-90s  Labs: CBC  Recent Labs  09/03/14 0455 09/04/14 0411  WBC 9.2 17.1*  HGB 7.5* 7.8*  HCT 24.3* 25.5*  MCV 82.9 84.4   PLT 273 825   Basic Metabolic Panel  Recent Labs  09/03/14 0455 09/04/14 0411  NA 133* 134*  K 3.8 3.8  CL 93* 94*  CO2 27 29  GLUCOSE 216* 150*  BUN 38* 52*  CALCIUM 8.4* 8.7*  PHOS 3.1 2.7   Liver Function Tests  Recent Labs  09/03/14 0455 09/04/14 0411  ALBUMIN 2.6* 3.0*   No results for input(s): LIPASE, AMYLASE in the last 72 hours. Cardiac Enzymes No results for input(s): CKTOTAL, CKMB, CKMBINDEX, TROPONINI in the last 72 hours.  BNP: BNP (last 3 results)  Recent Labs  07/24/14 0348 08/29/14 1917 08/30/14 0517  BNP 1075.1* 3845.1* 3766.5*    ProBNP (last 3 results)  Recent Labs  01/01/14 1107 02/07/14 0123 03/01/14 2250  PROBNP 3225.0* 2648.0* 7421.0*     D-Dimer No results for input(s): DDIMER in the last 72 hours. Hemoglobin A1C No results for input(s): HGBA1C in the last 72 hours. Fasting Lipid Panel No results for input(s): CHOL, HDL, LDLCALC, TRIG, CHOLHDL, LDLDIRECT in the last 72 hours. Thyroid Function Tests No results for input(s): TSH, T4TOTAL, T3FREE, THYROIDAB in the last 72 hours.  Invalid input(s): FREET3  Other results:     Imaging/Studies:   No results found.  Latest Echo  Latest Cath   Medications:     Scheduled Medications: . aspirin EC  81 mg Oral Daily  . budesonide-formoterol  2 puff Inhalation BID  .  citalopram  20 mg Oral Daily  . cloNIDine  0.2 mg Oral BID  . divalproex  500 mg Oral BID  . ferumoxytol  510 mg Intravenous Weekly  . furosemide  80 mg Intravenous BID  . hydrALAZINE  100 mg Oral 3 times per day  . isosorbide mononitrate  60 mg Oral Daily  . lisinopril  10 mg Oral BID  . methylPREDNISolone (SOLU-MEDROL) injection  60 mg Intravenous Q12H  . pantoprazole  40 mg Oral Daily  . sodium chloride  3 mL Intravenous Q12H     Infusions:     PRN Medications:  cyclobenzaprine, HYDROcodone-acetaminophen, ipratropium-albuterol, metoprolol, nitroGLYCERIN   Assessment:   1. Acute on  chronic diastolic CHF - EF 87-68% 03/21/55, full report above.- Non compliante 2. Acute COPD exacerbation - Symbicort, duonebs as needed. per primary team 3. IDA Chronic. Iron stores low. Given ferraheme 4. Abdominal pain- CT scan 07/22/14 unremarkable. Cirrhosis, likely 2/2 hepatic congestion. 5. Uncontrolled HTN- non compliant.- On clonidine, hydralazine, imdur, lisinopril, and metoprolol 6. Polysubstance abuse  - last used cocaine 7-10 days ago, UDS positive for cocaine and opiates 7. CAD - hx of 3 vessel CABG 12/2007 8. 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 PAD - ABI 09/02/13 severe PAD  Had ABI yesterday.  Was told he has poor circulation and describes claudication like symptoms.  Final results pending.  Down 13 lbs from highest weight this admission.  - 4.8 L  Net deficit.  Continue IV diuresis.  Pt counseled on important of drug cessation, medication compliance, and coming to his appointments. Will start low dose K supp.   Length of Stay: 5  Shirley Friar PA-C 09/04/2014, 8:46 AM  Advanced Heart Failure Team Pager [phone] (M-F; 7a - 4p)  Please contact [facility] for night-coverage after hours (4p -7a ) and weekends on [email or web address]  Patient seen and examined with Oda Kilts, PA-C. We discussed all aspects of the encounter. I agree with the assessment and plan as stated above.   CVP slightly up still but weight as low as it has been in a while and urine is dark with climbing BUN. Will switch back to po diuretics with lasix 40 po bid. Watch weight closely. Long talk about need for compliance with f/u appts. We will arrange f/u appts with taxi transportation. VVS seeing for severe PAD.   Bensimhon, Daniel,MD 10:47 AM

## 2014-09-04 NOTE — Consult Note (Signed)
Hospital Consult  Reason for consult: PAD, abnormal ABIs Consulting physician: Dr. Tana Coast [facility])  History of Present Illness  Charles [name] is a 69 y.o. (18-May-1945) male who we've been consulted regarding bilateral leg pain, left >right x 1 year.  The pain has been worsening. He describes a burning and cramping pain like he's been "exercising" in his left hip and buttock region and bilateral calves after ambulating 50 feet. He denies pain in his right hip/buttock. The pain improves with rest. He also says pain medicine helps his pain as well. He describes intermittent numbness in his feet and calves at night. He denies any history of non healing wounds. He says his pain is limiting his ambulation now. He lives in [place] with his daughter. He is a current smoker. He also has polysubstance abuse including cocaine and THC use. He has now cut down his cocaine usage to once a week. He has a left ankle bracelet on due to failure to show at court proceeding for shoplifting.   He presented to the [facility] ED with complaints of bilateral leg pain, swelling and shortness of breath. He has a PMH of diastolic CHF, CAD s/p CABG (2009), hyperlipidemia, hypertension, diabetes and CKD stage II. He has no history of arrhthmyias.  On ROS, he denies any chest pain or SOB. He does note DOE. He denies fever, chill, weakness, abdominal pain, dysuria. Full ROS below.   Past Medical History  Diagnosis Date  . Iron deficiency anemia   . Chronic combined systolic and diastolic CHF, NYHA class 2     a. 11/2013 Echo: EF 40-45%, basl-mid inflat AK, Gr 2 DD.  [name] Hyperlipidemia LDL goal <70   . Obstructive sleep apnea     a. not on home CPAP  . Ischemic cardiomyopathy     a. 11/2013 Echo: EF 40-45%, basl-mid inflat AK, Gr 2 DD, mild AI, mod dil LA/RA, mod reduced RV fxn, PASP 24mmHg.  [name] Homelessness   . Moderate to severe pulmonary hypertension 03/2010    a. PA peak pressure of 76 mmHg (per 2D Echo 03/2010)  .  Polysubstance abuse     a. cocaine, THC  . AV malformation of gastrointestinal tract     a. w/ h/o GIB.  [name] Family history of early CAD   . DDD (degenerative disc disease), lumbar   . Peripheral vascular disease   . Seizure disorder   . Hypertension   . CAD (coronary artery disease)     a. s/p 3-vessel CABG (12/2007) // 100% RCA stenosis with collaterals from left system. Severe bifurcation lesions of proxima CXA and OM. Moderate LAD disease - followed by Dr. Wyline Copas in [facility];  b. 11/2013 Myoview: Large fixed inf defect w/o ischemia, EF 35%.  . Diabetes   . Chronic kidney disease (CKD), stage III (moderate)     a. BL SCr 1.5-1.6  . Renal artery stenosis   . Seizures     Past Surgical History  Procedure Laterality Date  . Apc  03/2010    To treat small bowel AVMs  . Esophagogastroduodenoscopy N/A 08/14/2012    Procedure: ESOPHAGOGASTRODUODENOSCOPY (EGD);  Surgeon: [name], MD;  Location: [facility] ENDOSCOPY;  Service: Endoscopy;  Laterality: N/A;  . Hot hemostasis N/A 08/14/2012    Procedure: HOT HEMOSTASIS (ARGON PLASMA COAGULATION/BICAP);  Surgeon: [name], MD;  Location: [facility] ENDOSCOPY;  Service: Endoscopy;  Laterality: N/A;  . Esophagogastroduodenoscopy (egd) with propofol N/A 08/04/2013    Procedure: ESOPHAGOGASTRODUODENOSCOPY (EGD) WITH PROPOFOL;  Surgeon: [name], MD;  Location: [facility] ENDOSCOPY;  Service: Endoscopy;  Laterality: N/A;  . Coronary artery bypass graft  12/2007  . Esophagogastroduodenoscopy (egd) with propofol N/A 06/08/2014    Procedure: ESOPHAGOGASTRODUODENOSCOPY (EGD) WITH PROPOFOL;  Surgeon: Milus Banister, MD;  Location: [place];  Service: Endoscopy;  Laterality: N/A;    History   Social History  . Marital Status: Widowed    Spouse Name: N/A  . Number of Children: 2  . Years of Education: N/A   Occupational History  . Unemployed     previously worked in [employer]  .     Social History Main Topics  . Smoking status: Current Some Day  Smoker -- 0.25 packs/day for 56 years    Types: Cigarettes  . Smokeless tobacco: Never Used  . Alcohol Use: 0.0 oz/week    0 Standard drinks or equivalent per week     Comment: occasionally drinks, a few times a month  . Drug Use: 1.00 per week    Special: Cocaine, Marijuana     Comment: uses cocaine once/wk at least.  Last used on Monday, May 30th  . Sexual Activity: No     Comment: pt states "I smoked a little small piece on 02/25/14   Other Topics Concern  . Not on file   Social History Narrative   Lives in [place]. Originally from [place], has been in the [place] since 1980s      **11/2013 - says that he lives with dtr in [facility].**                      Family History  Problem Relation Age of Onset  . Heart disease Mother     unknown type  . Heart disease Father 40    died of MI at 59yo  . Heart disease Paternal Grandfather 60    died of MI  . Heart disease    . Heart disease Brother 30    No current facility-administered medications on file prior to encounter.   Current Outpatient Prescriptions on File Prior to Encounter  Medication Sig Dispense Refill  . albuterol (PROVENTIL HFA;VENTOLIN HFA) 108 (90 BASE) MCG/ACT inhaler Inhale 2 puffs into the lungs every 6 (six) hours as needed for wheezing or shortness of breath. 1 Inhaler 2  . aspirin 81 MG tablet Take 81 mg by mouth daily.    . citalopram (CELEXA) 20 MG tablet Take 20 mg by mouth daily.    . cloNIDine (CATAPRES) 0.2 MG tablet Take 1 tablet (0.2 mg total) by mouth 2 (two) times daily. 30 tablet 0  . cyclobenzaprine (FLEXERIL) 5 MG tablet Take 1 tablet (5 mg total) by mouth 3 (three) times daily as needed for muscle spasms (chest and leg pains). 30 tablet 0  . divalproex (DEPAKOTE ER) 500 MG 24 hr tablet Take 500 mg by mouth 2 (two) times daily.    . ferrous sulfate 325 (65 FE) MG tablet Take 325 mg by mouth 3 (three) times daily with meals.     . furosemide (LASIX) 40 MG tablet Take 1 tablet  (40 mg total) by mouth 2 (two) times daily. 30 tablet 0  . hydrALAZINE (APRESOLINE) 100 MG tablet Take 100 mg by mouth 3 (three) times daily.    . isosorbide mononitrate (IMDUR) 30 MG 24 hr tablet Take 2 tablets (60 mg total) by mouth daily. 30 tablet 0  . lisinopril (PRINIVIL,ZESTRIL) 20 MG tablet Take 0.5 tablets (  10 mg total) by mouth 2 (two) times daily.    . pantoprazole (PROTONIX) 40 MG tablet Take 40 mg by mouth daily.     . nitroGLYCERIN (NITROSTAT) 0.4 MG SL tablet Place 1 tablet (0.4 mg total) under the tongue every 5 (five) minutes as needed for chest pain (upto max 3 doses at one time.). 30 tablet 0    Allergies  Allergen Reactions  . Motrin [Ibuprofen] Other (See Comments)    Affects kidneys  . Tylenol [Acetaminophen] Other (See Comments)    Affects kidneys    REVIEW OF SYSTEMS:  (Positives checked otherwise negative)  CARDIOVASCULAR:  [ ]  chest pain, [ ]  chest pressure, [ ]  palpitations, [ ]  shortness of breath when laying flat, [x ] shortness of breath with exertion,   [x ] pain in legs when walking, [ ]  pain in feet when laying flat, [ ]  history of blood clot in veins (DVT), [ ]  history of phlebitis, [x ] swelling in legs, [ ]  varicose veins  PULMONARY:  [ ]  productive cough, [ ]  asthma, [ ]  wheezing  NEUROLOGIC:  [ ]  weakness in arms or legs, [ ]  numbness in arms or legs, [ ]  difficulty speaking or slurred speech, [ ]  temporary loss of vision in one eye, [ ]  dizziness  HEMATOLOGIC:  [ ]  bleeding problems, [ ]  problems with blood clotting too easily  MUSCULOSKEL:  [ ]  joint pain, [ ]  joint swelling  GASTROINTEST:  [ ]   Vomiting blood, [ ]   Blood in stool     GENITOURINARY:  [ ]   Burning with urination, [ ]   Blood in urine  PSYCHIATRIC:  [ ]  history of major depression  INTEGUMENTARY:  [ ]  rashes, [ ]  ulcers  CONSTITUTIONAL:  [ ]  fever, [ ]  chills  For VQI Use Only  PRE-ADM LIVING: Home  AMB STATUS: Ambulatory  CAD Sx: None  PRIOR CHF: Mild  STRESS TEST:  [ ]  No, [ ]  Normal, [ ]  + ischemia, [ ]  + MI, [ ]  Both   Physical Examination Filed Vitals:   09/03/14 2150 09/03/14 2155 09/04/14 0500 09/04/14 1057  BP:  152/69 163/94 130/70  Pulse:  81 86 85  Temp:  98.7 F (37.1 C) 97.6 F (36.4 C)   TempSrc:  Oral Oral   Resp:  19 18   Height:      Weight:   163 lb 4.8 oz (74.072 kg)   SpO2: 94% 97% 100%    Body mass index is 27.17 kg/(m^2).  General: A&O x 3, WDWN male in NAD  Head: /AT  Neck: Supple  Pulmonary: Sym exp, good air movt, CTAB, no rales, rhonchi, & wheezing  Cardiac: RRR, Nl S1, S2, no Murmurs, rubs or gallops, no carotid bruits  Vascular: Vessel Right Left  Radial 2+ 2+  Femoral 2+ Not palpable  Popliteal Not palpable Not palpable  PT Not palpable Not palpable  DP Not palpable Not palpable   Gastrointestinal: soft, NTND  Musculoskeletal: M/S 5/5 throughout, Extremities without ischemic changes.   Neurologic: CN 2-12 intact grossly intact. Pain and light touch intact in extremities bilaterally  Psychiatric: Judgment intact, Mood & affect appropriatefor pt's clinical situation  Dermatologic: No ulcers seen on extremities. Legs and arms with scattered hyperpigmented scars.   Non-Invasive Vascular Imaging  ABI (Date: 09/04/2014)  R: 0.42, monophasic DP and PT waveforms  L: 0.22, monophasic DP waveforms, absent PT   Medical Decision Making  Charles [name] is a 69 y.o.  male who presents with bilateral lower extremity claudication, left worse than right. He has left hip and buttock claudication in addition to bilateral calf claudication. His ABIs are left 0.22, right 0.42. He has a nonpalpable left femoral pulse, normal on right. 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 He has intact motor and sensory function to his legs bilaterally.  He will need an arteriogram with bilateral runoff and possible intervention for further  intervention. No urgency to do study at this time given chronicity of symptoms.  He does have a history of cocaine abuse, CAD and CHF. If he were to require future surgery, he would need to undergo formal cardiac clearance.  Dr. Scot Dock to see patient and to determine timing of diagnostic studies.   Virgina Jock, PA-C [facility] Office: [phone] Pager: [phone]  09/04/2014, 11:07 AM  Agree with above. He has chronic multilevel arterial occlusive disease. He will need an arteriogram electively. Given his social situation it may be best to do this while he is in the hospital and I could try to schedule this for Monday. However, if he is discharged before that certainly we Arrange it in the future as an outpatient. I have reviewed with the patient the indications for arteriography. In addition, I have reviewed the potential complications of arteriography including but not limited to: Bleeding, arterial injury, arterial thrombosis, dye action, renal insufficiency, or other unpredictable medical problems. I have explained to the patient that if we find disease amenable to angioplasty we could potentially address this at the same time. I have discussed the potential complications of angioplasty and stenting, including but not limited to: Bleeding, arterial thrombosis, arterial injury, dissection, or the need for surgical intervention.  Deitra Mayo, MD, [place] [phone] Office: [phone] '

## 2014-09-05 LAB — RENAL FUNCTION PANEL
ALBUMIN: 2.9 g/dL — AB (ref 3.5–5.0)
ANION GAP: 11 (ref 5–15)
BUN: 49 mg/dL — AB (ref 6–20)
CO2: 27 mmol/L (ref 22–32)
Calcium: 8.7 mg/dL — ABNORMAL LOW (ref 8.9–10.3)
Chloride: 96 mmol/L — ABNORMAL LOW (ref 101–111)
Creatinine, Ser: 1.22 mg/dL (ref 0.61–1.24)
GFR calc Af Amer: >60 mL/min (ref >60)
GFR, EST NON AFRICAN AMERICAN: 58 mL/min — AB (ref >60)
Glucose, Bld: 194 mg/dL — ABNORMAL HIGH (ref 65–99)
PHOSPHORUS: 2.8 mg/dL (ref 2.5–4.6)
Potassium: 3.9 mmol/L (ref 3.5–5.1)
Sodium: 134 mmol/L — ABNORMAL LOW (ref 135–145)

## 2014-09-05 LAB — CBC
HCT: 24.3 % — ABNORMAL LOW (ref 39.0–52.0)
Hemoglobin: 7.4 g/dL — ABNORMAL LOW (ref 13.0–17.0)
MCH: 25.7 pg — ABNORMAL LOW (ref 26.0–34.0)
MCHC: 30.5 g/dL (ref 30.0–36.0)
MCV: 84.4 fL (ref 78.0–100.0)
Platelets: 284 10*3/uL (ref 150–400)
RBC: 2.88 MIL/uL — ABNORMAL LOW (ref 4.22–5.81)
RDW: 16.9 % — AB (ref 11.5–15.5)
WBC: 14.3 10*3/uL — AB (ref 4.0–10.5)

## 2014-09-05 LAB — PREPARE RBC (CROSSMATCH)

## 2014-09-05 MED ORDER — HYDROCODONE-ACETAMINOPHEN 5-325 MG PO TABS
2.0000 | ORAL_TABLET | Freq: Four times a day (QID) | ORAL | Status: DC | PRN
  Administered 2014-09-05 – 2014-09-07 (×3): 2 via ORAL
  Filled 2014-09-05 (×3): qty 2

## 2014-09-05 MED ORDER — PREDNISONE 20 MG PO TABS
40.0000 mg | ORAL_TABLET | Freq: Every day | ORAL | Status: DC
  Administered 2014-09-05 – 2014-09-07 (×3): 40 mg via ORAL
  Filled 2014-09-05 (×4): qty 2

## 2014-09-05 MED ORDER — SODIUM CHLORIDE 0.9 % IV SOLN
Freq: Once | INTRAVENOUS | Status: AC
  Administered 2014-09-05: 10 mL via INTRAVENOUS

## 2014-09-05 MED ORDER — FUROSEMIDE 10 MG/ML IJ SOLN
20.0000 mg | Freq: Once | INTRAMUSCULAR | Status: AC
  Administered 2014-09-05: 20 mg via INTRAVENOUS
  Filled 2014-09-05: qty 2

## 2014-09-05 MED ORDER — MORPHINE SULFATE 2 MG/ML IJ SOLN
1.0000 mg | INTRAMUSCULAR | Status: DC | PRN
  Administered 2014-09-05 – 2014-09-07 (×7): 1 mg via INTRAVENOUS
  Filled 2014-09-05 (×7): qty 1

## 2014-09-05 NOTE — Progress Notes (Signed)
Advanced Heart Failure Rounding Note  PCP: None  Subjective:    Charles [name] is a 69 y.o. male with history of diastolic CHF (EF 11-94% 03/26/38), polysubstance abuse, CAD, tobacco use, and HTN who presented to [facility] on 08/29/14 with increased peripheral edema and SOB.  Breathing better. Switched to po lasix yesterday. Weight down to 161. Creatinine improved. For LE arteriogram on Monday for severely reduced ABIs.   Objective:   Weight Range: 73.4 kg (161 lb 13.1 oz) Body mass index is 26.93 kg/(m^2).   Vital Signs:   Temp:  [98 F (36.7 C)-98.1 F (36.7 C)] 98 F (36.7 C) (06/18 0345) Pulse Rate:  [83-91] 88 (06/18 0345) Resp:  [18-20] 18 (06/18 0345) BP: (129-160)/(67-86) 152/77 mmHg (06/18 0345) SpO2:  [96 %-100 %] 98 % (06/18 0345) Weight:  [73.4 kg (161 lb 13.1 oz)] 73.4 kg (161 lb 13.1 oz) (06/18 0345) Last BM Date: 09/04/14  Weight change: Filed Weights   09/03/14 0528 09/04/14 0500 09/05/14 0345  Weight: 73.573 kg (162 lb 3.2 oz) 74.072 kg (163 lb 4.8 oz) 73.4 kg (161 lb 13.1 oz)    Intake/Output:   Intake/Output Summary (Last 24 hours) at 09/05/14 0911 Last data filed at 09/05/14 0841  Gross per 24 hour  Intake   2020 ml  Output   1025 ml  Net    995 ml     Physical Exam: General: Well appearing. No resp difficulty HEENT: normal Neck: supple. JVP 9. Carotids 2+ bilat; no bruits. No lymphadenopathy or thryomegaly appreciated. Cor: PMI nondisplaced. Regular rate & rhythm. No rubs, gallops or murmurs. Lungs: clear Abdomen: soft, nontender, nondistended. No hepatosplenomegaly. No bruits or masses. Good bowel sounds. Extremities: no cyanosis, clubbing, rash. trace LE edema Neuro: alert & orientedx3, cranial nerves grossly intact. moves all 4 extremities w/o difficulty. Affect pleasant   Telemetry: NSR 80-90s  Labs: CBC  Recent Labs  09/04/14 0411 09/05/14 0356  WBC 17.1* 14.3*  HGB 7.8* 7.4*  HCT 25.5* 24.3*  MCV 84.4 84.4  PLT 280 814    Basic Metabolic Panel  Recent Labs  09/04/14 0411 09/05/14 0356  NA 134* 134*  K 3.8 3.9  CL 94* 96*  CO2 29 27  GLUCOSE 150* 194*  BUN 52* 49*  CALCIUM 8.7* 8.7*  PHOS 2.7 2.8   Liver Function Tests  Recent Labs  09/04/14 0411 09/05/14 0356  ALBUMIN 3.0* 2.9*   No results for input(s): LIPASE, AMYLASE in the last 72 hours. Cardiac Enzymes No results for input(s): CKTOTAL, CKMB, CKMBINDEX, TROPONINI in the last 72 hours.  BNP: BNP (last 3 results)  Recent Labs  07/24/14 0348 08/29/14 1917 08/30/14 0517  BNP 1075.1* 3845.1* 3766.5*    ProBNP (last 3 results)  Recent Labs  01/01/14 1107 02/07/14 0123 03/01/14 2250  PROBNP 3225.0* 2648.0* 7421.0*     D-Dimer No results for input(s): DDIMER in the last 72 hours. Hemoglobin A1C No results for input(s): HGBA1C in the last 72 hours. Fasting Lipid Panel No results for input(s): CHOL, HDL, LDLCALC, TRIG, CHOLHDL, LDLDIRECT in the last 72 hours. Thyroid Function Tests No results for input(s): TSH, T4TOTAL, T3FREE, THYROIDAB in the last 72 hours.  Invalid input(s): FREET3  Other results:     Imaging/Studies:  No results found.  Latest Echo  Latest Cath   Medications:     Scheduled Medications: . sodium chloride   Intravenous Once  . aspirin EC  81 mg Oral Daily  . budesonide-formoterol  2 puff Inhalation  BID  . citalopram  20 mg Oral Daily  . cloNIDine  0.2 mg Oral BID  . divalproex  500 mg Oral BID  . ferumoxytol  510 mg Intravenous Weekly  . furosemide  20 mg Intravenous Once  . furosemide  40 mg Oral BID  . hydrALAZINE  100 mg Oral 3 times per day  . isosorbide mononitrate  60 mg Oral Daily  . lisinopril  10 mg Oral BID  . pantoprazole  40 mg Oral Daily  . predniSONE  40 mg Oral Q breakfast  . sodium chloride  3 mL Intravenous Q12H    Infusions:    PRN Medications: cyclobenzaprine, HYDROcodone-acetaminophen, ipratropium-albuterol, metoprolol, morphine injection,  nitroGLYCERIN   Assessment:   1. Acute on chronic diastolic CHF - EF 12-24% 06/26/73, full report above.- Non compliante 2. Acute COPD exacerbation - Symbicort, duonebs as needed. per primary team 3. IDA Chronic. Iron stores low. Given ferraheme 4. Abdominal pain- CT scan 07/22/14 unremarkable. Cirrhosis, likely 2/2 hepatic congestion. 5. Uncontrolled HTN- non compliant.- On clonidine, hydralazine, imdur, lisinopril, and metoprolol 6. Polysubstance abuse  - last used cocaine 7-10 days ago, UDS positive for cocaine and opiates 7. CAD - hx of 3 vessel CABG 12/2007 8. 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 PAD - ABI 09/02/13 severe PAD  Back on po lasix. Volume status looks good. Renal function improved. Continue current regimen. For LE arteriogram on Monday.   Tabitha Tupper,MD 9:11 AM  Advanced Heart Failure Team Pager [phone] (M-F; 7a - 4p)  Please contact [facility] for night-coverage after hours (4p -7a ) and weekends on [email or web address]

## 2014-09-05 NOTE — Progress Notes (Signed)
[facility]                                                                              Patient Demographics  Charles [name], is a 69 y.o. male, DOB - [birth year], UMP:[id number]  Admit date - 08/29/2014   Admitting Physician Quintella Baton, MD  Outpatient Primary MD for the patient is No primary care provider on file.  LOS - 6   Chief Complaint  Patient presents with  . Leg Swelling       Brief HPI   Patient is a 69 year old male with chronic combined systolic and diastolic CHF, known history of iron deficiency anemia, hyperlipidemia, moderate to severe pulmonary hypertension, polysubstance abuse, seizure disorder, CAD, hypertension, seizures presented with leg swelling and pain. Over the last 3 days he's developed increasing swelling on his bilateral legs accompanied by pain. He complained of some SOB but the pain in his legs take precedence. He stated he has occasional legs pains but this is much worse. He has not seen his PCP who he states has retired. He denied wheezing or chest pain. He states he is compliant with his medication but his pain now causes his blood pressure to be uncontrolled.     Assessment & Plan    Principal problem  Acute on chronic combined CHF with abdominal bloating and peripheral edema; likely due to medical noncompliance, cocaine use and dietary indiscretion. Patient has had recurrent admissions since the beginning of this year for the same symptoms, admitted 2/16,  3/16 (twice in a month), 4/16, 5/16 and current admission - 2-D echo in March 2016 had shown EF of 45-50%, diffuse hypokinesis and grade 2 diastolic dysfunction -Continue Lasix, negative balance of 3.8 L  - Appreciate cardiology recommendations - I discussed in detail with the patient regarding his cocaine use habits leading to decompensated CHF, patient agrees to cut down   Active problems Leg pains with severe peripheral vascular disease - ABI's show severe peripheral  vascular disease, right 0.42 and left 0.22 - Appreciate vascular surgery recommendations,plan arteriogram on Monday 09/07/14   Acute COPD exacerbation Significant improvement, continue Symbicort, DuoNeb's, transitioned to oral prednisone   Chronic history of iron deficiency anemia - placed on SCDs anemia panel showed iron of 27, ferritin 40, saturation ratio 7  - Transfuse 2 units of packed RBCs, planning arteriogram on Monday  Abdominal discomfort- has abdominal bloating likely due to #1, no pain currently  Recent CT scan (on 07/22/14) showed no acute abnormalities, cirrhotic liver with associated small ascites.Likely due to hepatic congestion   Uncontrolled hypertension Likely secondary to nonadherence to medications, Now improving  Polysubstance abuse Reported last cocaine use was 7-10 days ago.Urine drug screen positive for cocaine on 6/12 and has been positive on every admission.  CKD stage III Baseline creatinine about 1.3, currently creatinine function stable  Seizure disorder Patient was on Dilantin which was discontinued last May, no seizure-like activity. Patient does not drive.  CAD status post CABG Continue aspirin, not on beta blockers secondary to ongoing cocaine abuse.  Cirrhotic liver Noted on previous CT scan from May 2016  Code Status: Full code   Family  Communication: Discussed in detail with the patient, all imaging results, lab results explained to the patient   Disposition Plan: Pending arteriogram on Monday  Time Spent in minutes 25  minutes  Procedures  None  Consults   None  DVT Prophylaxis   SCD's  Medications  Scheduled Meds: . sodium chloride   Intravenous Once  . aspirin EC  81 mg Oral Daily  . budesonide-formoterol  2 puff Inhalation BID  . citalopram  20 mg Oral Daily  . cloNIDine  0.2 mg Oral BID  . divalproex  500 mg Oral BID  . ferumoxytol  510 mg Intravenous Weekly  . furosemide  20 mg Intravenous Once  . furosemide  40 mg Oral  BID  . hydrALAZINE  100 mg Oral 3 times per day  . isosorbide mononitrate  60 mg Oral Daily  . lisinopril  10 mg Oral BID  . pantoprazole  40 mg Oral Daily  . predniSONE  40 mg Oral Q breakfast  . sodium chloride  3 mL Intravenous Q12H   Continuous Infusions:  PRN Meds:.cyclobenzaprine, HYDROcodone-acetaminophen, ipratropium-albuterol, metoprolol, morphine injection, nitroGLYCERIN   Antibiotics   Anti-infectives    None        Subjective:   Charles [name] was seen and examined today. Feeling better today, ambulating in the hallway, has chronic claudication and pain in the legs. peripheral edema significantly improved . Patient denies dizziness, chest pain, shortness of breath, abdominal pain, N/V/D/C, new weakness, numbess, tingling. No acute events overnight.  No fevers.  Objective:   Blood pressure 154/79, pulse 94, temperature 98 F (36.7 C), temperature source Oral, resp. rate 16, height 5\' 5"  (1.651 m), weight 73.4 kg (161 lb 13.1 oz), SpO2 100 %.  Wt Readings from Last 3 Encounters:  09/05/14 73.4 kg (161 lb 13.1 oz)  07/26/14 73.8 kg (162 lb 11.2 oz)  07/05/14 76.114 kg (167 lb 12.8 oz)     Intake/Output Summary (Last 24 hours) at 09/05/14 1252 Last data filed at 09/05/14 0841  Gross per 24 hour  Intake   1780 ml  Output   1025 ml  Net    755 ml    Exam  General: Alert and oriented x 3, NAD  HEENT:  PERRLA, EOMI,  Neck: Supple, no JVD  CVS: S1 S2 auscultated, RRR  Respiratory: Clear to solution bilaterally   Abdomen : Soft, nontender, nondistended, + bowel sounds  Ext: no cyanosis clubbing, no edema, tracking device on the right leg   Neuro: no new deficits.   Skin: No rashes  Psych: Normal affect and demeanor, alert and oriented x3    Data Review   Micro Results Recent Results (from the past 240 hour(s))  MRSA PCR Screening     Status: Abnormal   Collection Time: 08/30/14 12:59 AM  Result Value Ref Range Status   MRSA by PCR POSITIVE  (A) NEGATIVE Final    Comment:        The GeneXpert MRSA Assay (FDA approved for NASAL specimens only), is one component of a comprehensive MRSA colonization surveillance program. It is not intended to diagnose MRSA infection nor to guide or monitor treatment for MRSA infections. RESULT CALLED TO, READ BACK BY AND VERIFIED WITH: HARAWAY,J RN 0424 08/30/14 MITCHELL,L     Radiology Reports Dg Chest 2 View  08/29/2014   CLINICAL DATA:  Fluid build-up at the legs.  Initial encounter.  EXAM: CHEST  2 VIEW  COMPARISON:  Chest radiograph performed 08/27/2014  FINDINGS: The lungs  are well-aerated. Vascular congestion is noted, with increased interstitial markings, concerning for mild interstitial edema. No pleural effusion or pneumothorax is seen.  The heart is enlarged. The patient is status post median sternotomy, with evidence of prior CABG. No acute osseous abnormalities are seen.  IMPRESSION: Vascular congestion and cardiomegaly, with increased interstitial markings, concerning for mild interstitial edema.   Electronically Signed   By: Garald Balding M.D.   On: 08/29/2014 19:35    CBC  Recent Labs Lab 08/29/14 1917  08/31/14 0430 09/02/14 0925 09/03/14 0455 09/04/14 0411 09/05/14 0356  WBC 5.0  < > 4.1 3.8* 9.2 17.1* 14.3*  HGB 8.2*  < > 7.8* 8.1* 7.5* 7.8* 7.4*  HCT 27.1*  < > 25.6* 26.6* 24.3* 25.5* 24.3*  PLT 259  < > 255 272 273 280 284  MCV 85.2  < > 85.6 83.9 82.9 84.4 84.4  MCH 25.8*  < > 26.1 25.6* 25.6* 25.8* 25.7*  MCHC 30.3  < > 30.5 30.5 30.9 30.6 30.5  RDW 17.4*  < > 17.8* 17.0* 16.8* 16.7* 16.9*  LYMPHSABS 0.6*  --   --   --   --   --   --   MONOABS 0.7  --   --   --   --   --   --   EOSABS 0.2  --   --   --   --   --   --   BASOSABS 0.0  --   --   --   --   --   --   < > = values in this interval not displayed.  Chemistries   Recent Labs Lab 08/30/14 0519 08/31/14 0430 09/02/14 0259 09/03/14 0455 09/04/14 0411 09/05/14 0356  NA 136 139 135 133* 134*  134*  K 3.6 4.1 3.6 3.8 3.8 3.9  CL 102 104 97* 93* 94* 96*  CO2 25 26 30 27 29 27   GLUCOSE 109* 129* 112* 216* 150* 194*  BUN 28* 25* 27* 38* 52* 49*  CREATININE 1.31* 1.39* 1.30* 1.38* 1.40* 1.22  CALCIUM 8.4* 8.6* 8.4* 8.4* 8.7* 8.7*  MG 1.9  --   --   --   --   --   AST  --  25  --   --   --   --   ALT  --  13*  --   --   --   --   ALKPHOS  --  75  --   --   --   --   BILITOT  --  0.7  --   --   --   --    ------------------------------------------------------------------------------------------------------------------ estimated creatinine clearance is 49 mL/min (by C-G formula based on Cr of 1.22). ------------------------------------------------------------------------------------------------------------------ No results for input(s): HGBA1C in the last 72 hours. ------------------------------------------------------------------------------------------------------------------ No results for input(s): CHOL, HDL, LDLCALC, TRIG, CHOLHDL, LDLDIRECT in the last 72 hours. ------------------------------------------------------------------------------------------------------------------ No results for input(s): TSH, T4TOTAL, T3FREE, THYROIDAB in the last 72 hours.  Invalid input(s): FREET3 ------------------------------------------------------------------------------------------------------------------ No results for input(s): VITAMINB12, FOLATE, FERRITIN, TIBC, IRON, RETICCTPCT in the last 72 hours.  Coagulation profile  Recent Labs Lab 08/31/14 0430  INR 1.28    No results for input(s): DDIMER in the last 72 hours.  Cardiac Enzymes No results for input(s): CKMB, TROPONINI, MYOGLOBIN in the last 168 hours.  Invalid input(s): CK ------------------------------------------------------------------------------------------------------------------ Invalid input(s): POCBNP  No results for input(s): GLUCAP in the last 72 hours.   Basel Defalco M.D. [facility] 09/05/2014,  12:52 PM  Pager: [phone]   Between 7am to 7pm - call Pager - [phone]  After 7pm go to [email or web address] - password [email or web address]  Call night coverage person covering after 7pm

## 2014-09-05 NOTE — Progress Notes (Signed)
Patient ID: Charles [name], male   DOB: [birth year], 69 y.o.   MRN: [id number]   Charles [name] is a 70 y.o. male with history of diastolic CHF (EF 62-26% 05/20/33), polysubstance abuse, CAD, tobacco use, and HTN who presented to [facility] on 08/29/14 with increased peripheral edema and SOB.  Breathing better. Switched to po lasix yesterday. Weight down to 161. Creatinine improved. For LE arteriogram on Monday for severely reduced ABIs.   Objective:   Weight Range: 73.4 kg (161 lb 13.1 oz) Body mass index is 26.93 kg/(m^2).   Vital Signs: Temp: [98 F (36.7 C)-98.1 F (36.7 C)] 98 F (36.7 C) (06/18 0345) Pulse Rate: [83-91] 88 (06/18 0345) Resp: [18-20] 18 (06/18 0345) BP: (129-160)/(67-86) 152/77 mmHg (06/18 0345) SpO2: [96 %-100 %] 98 % (06/18 0345) Weight: [73.4 kg (161 lb 13.1 oz)] 73.4 kg (161 lb 13.1 oz) (06/18 0345) Last BM Date: 09/04/14  Weight change: Filed Weights   09/03/14 0528 09/04/14 0500 09/05/14 0345  Weight: 73.573 kg (162 lb 3.2 oz) 74.072 kg (163 lb 4.8 oz) 73.4 kg (161 lb 13.1 oz)    Intake/Output:  Intake/Output Summary (Last 24 hours) at 09/05/14 0911 Last data filed at 09/05/14 0841  Gross per 24 hour  Intake  2020 ml  Output  1025 ml  Net  995 ml    Physical Exam: General: Well appearing. No resp difficulty HEENT: normal Neck: supple. JVP 9. Carotids 2+ bilat; no bruits. No lymphadenopathy or thryomegaly appreciated. Cor: PMI nondisplaced. Regular rate & rhythm. No rubs, gallops or murmurs. Lungs: clear Abdomen: soft, nontender, nondistended. No hepatosplenomegaly. No bruits or masses. Good bowel sounds. Extremities: no cyanosis, clubbing, rash. trace LE edema Neuro: alert & orientedx3, cranial nerves grossly intact. moves all 4 extremities w/o difficulty. Affect pleasant   Telemetry: NSR 80-90s  Labs: CBC  Recent Labs (last 2 labs)      Recent Labs   09/04/14 0411 09/05/14 0356  WBC 17.1* 14.3*  HGB 7.8* 7.4*  HCT 25.5* 24.3*  MCV 84.4 84.4  PLT 280 284     Basic Metabolic Panel  Recent Labs (last 2 labs)      Recent Labs  09/04/14 0411 09/05/14 0356  NA 134* 134*  K 3.8 3.9  CL 94* 96*  CO2 29 27  GLUCOSE 150* 194*  BUN 52* 49*  CALCIUM 8.7* 8.7*  PHOS 2.7 2.8     Liver Function Tests  Recent Labs (last 2 labs)      Recent Labs  09/04/14 0411 09/05/14 0356  ALBUMIN 3.0* 2.9*      Recent Labs (last 2 labs)     No results for input(s): LIPASE, AMYLASE in the last 72 hours.   Cardiac Enzymes  Recent Labs (last 2 labs)     No results for input(s): CKTOTAL, CKMB, CKMBINDEX, TROPONINI in the last 72 hours.    BNP: BNP (last 3 results)  Recent Labs (within last 365 days)     Recent Labs  07/24/14 0348 08/29/14 1917 08/30/14 0517  BNP 1075.1* 3845.1* 3766.5*      ProBNP (last 3 results)  Recent Labs (within last 365 days)     Recent Labs  01/01/14 1107 02/07/14 0123 03/01/14 2250  PROBNP 3225.0* 2648.0* 7421.0*       D-Dimer  Recent Labs (last 2 labs)     No results for input(s): DDIMER in the last 72 hours.   Hemoglobin A1C  Recent Labs (last 2 labs)  No results for input(s): HGBA1C in the last 72 hours.   Fasting Lipid Panel  Recent Labs (last 2 labs)     No results for input(s): CHOL, HDL, LDLCALC, TRIG, CHOLHDL, LDLDIRECT in the last 72 hours.   Thyroid Function Tests  Recent Labs (last 2 labs)     No results for input(s): TSH, T4TOTAL, T3FREE, THYROIDAB in the last 72 hours.  Invalid input(s): FREET3    Other results:     Imaging/Studies:     Imaging Results (Last 48 hours)    No results found.    Latest Echo  Latest Cath   Medications:     Scheduled Medications: . sodium chloride  Intravenous Once  . aspirin EC 81 mg Oral Daily  . budesonide-formoterol 2 puff  Inhalation BID  . citalopram 20 mg Oral Daily  . cloNIDine 0.2 mg Oral BID  . divalproex 500 mg Oral BID  . ferumoxytol 510 mg Intravenous Weekly  . furosemide 20 mg Intravenous Once  . furosemide 40 mg Oral BID  . hydrALAZINE 100 mg Oral 3 times per day  . isosorbide mononitrate 60 mg Oral Daily  . lisinopril 10 mg Oral BID  . pantoprazole 40 mg Oral Daily  . predniSONE 40 mg Oral Q breakfast  . sodium chloride 3 mL Intravenous Q12H    Infusions:     PRN Medications:  cyclobenzaprine, HYDROcodone-acetaminophen, ipratropium-albuterol, metoprolol, morphine injection, nitroGLYCERIN   Assessment:   1. Acute on chronic diastolic CHF - EF 75-05% 03/28/31, full report above.- Non compliant. He appears euvolemic and is back on po lasix. If plan is for arteriogram, might consider gentle hydration prior to procedure. 2. Acute COPD exacerbation - Symbicort, duonebs as needed. per primary team 3. IDA Chronic. Iron stores low. Given ferraheme 4. Abdominal pain- CT scan 07/22/14 unremarkable. Cirrhosis, likely 2/2 hepatic congestion. 5. Uncontrolled HTN- non compliant.- On clonidine, hydralazine, imdur, lisinopril, and metoprolol 6. Polysubstance abuse - last used cocaine 7-10 days ago, UDS positive for cocaine and opiates 7. CAD - hx of 3 vessel CABG 12/2007 8. 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 PAD - ABI 09/02/13 severe PAD     Charles [name].D.

## 2014-09-06 DIAGNOSIS — R52 Pain, unspecified: Secondary | ICD-10-CM | POA: Insufficient documentation

## 2014-09-06 DIAGNOSIS — N183 Chronic kidney disease, stage 3 (moderate): Secondary | ICD-10-CM

## 2014-09-06 LAB — RENAL FUNCTION PANEL
Albumin: 2.8 g/dL — ABNORMAL LOW (ref 3.5–5.0)
Anion gap: 11 (ref 5–15)
BUN: 47 mg/dL — ABNORMAL HIGH (ref 6–20)
CO2: 27 mmol/L (ref 22–32)
Calcium: 8.8 mg/dL — ABNORMAL LOW (ref 8.9–10.3)
Chloride: 97 mmol/L — ABNORMAL LOW (ref 101–111)
Creatinine, Ser: 1.47 mg/dL — ABNORMAL HIGH (ref 0.61–1.24)
GFR calc Af Amer: 54 mL/min — ABNORMAL LOW
GFR calc non Af Amer: 47 mL/min — ABNORMAL LOW
Glucose, Bld: 116 mg/dL — ABNORMAL HIGH (ref 65–99)
Phosphorus: 3.3 mg/dL (ref 2.5–4.6)
Potassium: 4.3 mmol/L (ref 3.5–5.1)
Sodium: 135 mmol/L (ref 135–145)

## 2014-09-06 LAB — TYPE AND SCREEN
ABO/RH(D): O POS
Antibody Screen: NEGATIVE
UNIT DIVISION: 0
Unit division: 0

## 2014-09-06 LAB — CBC
HCT: 28.2 % — ABNORMAL LOW (ref 39.0–52.0)
Hemoglobin: 8.8 g/dL — ABNORMAL LOW (ref 13.0–17.0)
MCH: 25.6 pg — ABNORMAL LOW (ref 26.0–34.0)
MCHC: 31.2 g/dL (ref 30.0–36.0)
MCV: 82 fL (ref 78.0–100.0)
Platelets: 294 K/uL (ref 150–400)
RBC: 3.44 MIL/uL — ABNORMAL LOW (ref 4.22–5.81)
RDW: 17.8 % — ABNORMAL HIGH (ref 11.5–15.5)
WBC: 10 K/uL (ref 4.0–10.5)

## 2014-09-06 MED ORDER — SODIUM CHLORIDE 0.9 % IV SOLN
INTRAVENOUS | Status: DC
  Administered 2014-09-06: 08:00:00 via INTRAVENOUS

## 2014-09-06 NOTE — Progress Notes (Signed)
[facility]                                                                              Patient Demographics  Charles [name], is a 69 y.o. male, DOB - 12-23-1945, YHC:[id number]  Admit date - 08/29/2014   Admitting Physician Quintella Baton, MD  Outpatient Primary MD for the patient is No primary care provider on file.  LOS - 7   Chief Complaint  Patient presents with  . Leg Swelling       Brief HPI   Patient is a 70 year old male with chronic combined systolic and diastolic CHF, known history of iron deficiency anemia, hyperlipidemia, moderate to severe pulmonary hypertension, polysubstance abuse, seizure disorder, CAD, hypertension, seizures presented with leg swelling and pain. Over the last 3 days he's developed increasing swelling on his bilateral legs accompanied by pain. He complained of some SOB but the pain in his legs take precedence. He stated he has occasional legs pains but this is much worse. He has not seen his PCP who he states has retired. He denied wheezing or chest pain. He states he is compliant with his medication but his pain now causes his blood pressure to be uncontrolled.     Assessment & Plan    Principal problem  Acute on chronic combined CHF with abdominal bloating and peripheral edema; likely due to medical noncompliance, cocaine use and dietary indiscretion. Patient has had recurrent admissions since the beginning of this year for the same symptoms, admitted 2/16,  3/16 (twice in a month), 4/16, 5/16 and current admission - 2-D echo in March 2016 had shown EF of 45-50%, diffuse hypokinesis and grade 2 diastolic dysfunction -Continue Lasix, negative balance of 3.2 L, placed on IV fluids for arteriogram planned tomorrow 6/20  - Appreciate cardiology and vascular surgery recommendations   Active problems Leg pains with severe peripheral vascular disease - ABI's show severe peripheral vascular disease, right 0.42 and left 0.22 -  Appreciate vascular surgery recommendations,plan arteriogram on Monday 09/07/14   Acute COPD exacerbation Significant improvement, continue Symbicort, DuoNeb's, transitioned to oral prednisone   Chronic history of iron deficiency anemia - placed on SCDs anemia panel showed iron of 27, ferritin 40, saturation ratio 7  - H&H 8.8/28.2, status post 2 units of packed RBCs on 6/18, planning arteriogram on Monday  Abdominal discomfort- has abdominal bloating likely due to #1, no pain currently  Recent CT scan (on 07/22/14) showed no acute abnormalities, cirrhotic liver with associated small ascites.Likely due to hepatic congestion   Uncontrolled hypertension Likely secondary to nonadherence to medications, Now improving  Polysubstance abuse Reported last cocaine use was 7-10 days ago.Urine drug screen positive for cocaine on 6/12 and has been positive on every admission. Patient counseled strongly on cocaine cessation, he agrees to cut down  CKD stage III Baseline creatinine about 1.3, currently creatinine function stable, being gently hydrated per vascular surgery recommendations for arteriogram tomorrow  Seizure disorder Patient was on Dilantin which was discontinued last May, no seizure-like activity. Patient does not drive.  CAD status post CABG Continue aspirin, not on beta blockers secondary to ongoing cocaine abuse.  Cirrhotic liver  Noted on previous CT scan from May 2016  Code Status: Full code   Family Communication: Discussed in detail with the patient, all imaging results, lab results explained to the patient   Disposition Plan: Pending arteriogram on Monday  Time Spent in minutes 25  minutes  Procedures  None  Consults   None  DVT Prophylaxis   SCD's  Medications  Scheduled Meds: . aspirin EC  81 mg Oral Daily  . budesonide-formoterol  2 puff Inhalation BID  . citalopram  20 mg Oral Daily  . cloNIDine  0.2 mg Oral BID  . divalproex  500 mg Oral BID  .  ferumoxytol  510 mg Intravenous Weekly  . furosemide  40 mg Oral BID  . hydrALAZINE  100 mg Oral 3 times per day  . isosorbide mononitrate  60 mg Oral Daily  . lisinopril  10 mg Oral BID  . pantoprazole  40 mg Oral Daily  . predniSONE  40 mg Oral Q breakfast  . sodium chloride  3 mL Intravenous Q12H   Continuous Infusions: . sodium chloride 50 mL/hr at 09/06/14 0739   PRN Meds:.cyclobenzaprine, HYDROcodone-acetaminophen, ipratropium-albuterol, metoprolol, morphine injection, nitroGLYCERIN   Antibiotics   Anti-infectives    None        Subjective:   Charles [name] was seen and examined today. Denies any specific complaints except chronic pain in the legs, anxious about the test tomorrow. Patient denies dizziness, chest pain, shortness of breath, abdominal pain, N/V/D/C, new weakness, numbess, tingling. No acute events overnight.    Objective:   Blood pressure 153/82, pulse 93, temperature 97 F (36.1 C), temperature source Oral, resp. rate 18, height 5\' 5"  (1.651 m), weight 77.157 kg (170 lb 1.6 oz), SpO2 100 %.  Wt Readings from Last 3 Encounters:  09/06/14 77.157 kg (170 lb 1.6 oz)  07/26/14 73.8 kg (162 lb 11.2 oz)  07/05/14 76.114 kg (167 lb 12.8 oz)     Intake/Output Summary (Last 24 hours) at 09/06/14 1147 Last data filed at 09/06/14 1042  Gross per 24 hour  Intake   2152 ml  Output   1100 ml  Net   1052 ml    Exam  General: Alert and oriented x 3, NAD  HEENT:  PERRLA, EOMI,  Neck: Supple, no JVD  CVS: S1 S2 auscultated, RRR  Respiratory:  Dec BS at bases  Abdomen : Soft, nontender, nondistended, + bowel sounds  Ext: no c/c, 1+ edema, tracking device on the right leg   Neuro: no new deficits.   Skin: No rashes  Psych:  alert and oriented x3    Data Review   Micro Results Recent Results (from the past 240 hour(s))  MRSA PCR Screening     Status: Abnormal   Collection Time: 08/30/14 12:59 AM  Result Value Ref Range Status   MRSA by PCR  POSITIVE (A) NEGATIVE Final    Comment:        The GeneXpert MRSA Assay (FDA approved for NASAL specimens only), is one component of a comprehensive MRSA colonization surveillance program. It is not intended to diagnose MRSA infection nor to guide or monitor treatment for MRSA infections. RESULT CALLED TO, READ BACK BY AND VERIFIED WITH: HARAWAY,J RN 0424 08/30/14 MITCHELL,L     Radiology Reports Dg Chest 2 View  08/29/2014   CLINICAL DATA:  Fluid build-up at the legs.  Initial encounter.  EXAM: CHEST  2 VIEW  COMPARISON:  Chest radiograph performed 08/27/2014  FINDINGS: The lungs are well-aerated.  Vascular congestion is noted, with increased interstitial markings, concerning for mild interstitial edema. No pleural effusion or pneumothorax is seen.  The heart is enlarged. The patient is status post median sternotomy, with evidence of prior CABG. No acute osseous abnormalities are seen.  IMPRESSION: Vascular congestion and cardiomegaly, with increased interstitial markings, concerning for mild interstitial edema.   Electronically Signed   By: Garald Balding M.D.   On: 08/29/2014 19:35    CBC  Recent Labs Lab 09/02/14 0925 09/03/14 0455 09/04/14 0411 09/05/14 0356 09/06/14 0356  WBC 3.8* 9.2 17.1* 14.3* 10.0  HGB 8.1* 7.5* 7.8* 7.4* 8.8*  HCT 26.6* 24.3* 25.5* 24.3* 28.2*  PLT 272 273 280 284 294  MCV 83.9 82.9 84.4 84.4 82.0  MCH 25.6* 25.6* 25.8* 25.7* 25.6*  MCHC 30.5 30.9 30.6 30.5 31.2  RDW 17.0* 16.8* 16.7* 16.9* 17.8*    Chemistries   Recent Labs Lab 08/31/14 0430 09/02/14 0259 09/03/14 0455 09/04/14 0411 09/05/14 0356 09/06/14 0356  NA 139 135 133* 134* 134* 135  K 4.1 3.6 3.8 3.8 3.9 4.3  CL 104 97* 93* 94* 96* 97*  CO2 26 30 27 29 27 27   GLUCOSE 129* 112* 216* 150* 194* 116*  BUN 25* 27* 38* 52* 49* 47*  CREATININE 1.39* 1.30* 1.38* 1.40* 1.22 1.47*  CALCIUM 8.6* 8.4* 8.4* 8.7* 8.7* 8.8*  AST 25  --   --   --   --   --   ALT 13*  --   --   --   --    --   ALKPHOS 75  --   --   --   --   --   BILITOT 0.7  --   --   --   --   --    ------------------------------------------------------------------------------------------------------------------ estimated creatinine clearance is 44.8 mL/min (by C-G formula based on Cr of 1.47). ------------------------------------------------------------------------------------------------------------------ No results for input(s): HGBA1C in the last 72 hours. ------------------------------------------------------------------------------------------------------------------ No results for input(s): CHOL, HDL, LDLCALC, TRIG, CHOLHDL, LDLDIRECT in the last 72 hours. ------------------------------------------------------------------------------------------------------------------ No results for input(s): TSH, T4TOTAL, T3FREE, THYROIDAB in the last 72 hours.  Invalid input(s): FREET3 ------------------------------------------------------------------------------------------------------------------ No results for input(s): VITAMINB12, FOLATE, FERRITIN, TIBC, IRON, RETICCTPCT in the last 72 hours.  Coagulation profile  Recent Labs Lab 08/31/14 0430  INR 1.28    No results for input(s): DDIMER in the last 72 hours.  Cardiac Enzymes No results for input(s): CKMB, TROPONINI, MYOGLOBIN in the last 168 hours.  Invalid input(s): CK ------------------------------------------------------------------------------------------------------------------ Invalid input(s): POCBNP  No results for input(s): GLUCAP in the last 72 hours.   RAI,RIPUDEEP M.D. [facility] 09/06/2014, 11:47 AM  Pager: [phone]   Between 7am to 7pm - call Pager - [phone]  After 7pm go to [email or web address] - password [email or web address]  Call night coverage person covering after 7pm

## 2014-09-06 NOTE — Progress Notes (Signed)
Patient ID: Charles [name], male   DOB: [birth year], 69 y.o.   MRN: [id number]    SUBJECTIVE: The patient is doing well today.  At this time, he denies chest pain, shortness of breath, anxious about angiogram.  . aspirin EC  81 mg Oral Daily  . budesonide-formoterol  2 puff Inhalation BID  . citalopram  20 mg Oral Daily  . cloNIDine  0.2 mg Oral BID  . divalproex  500 mg Oral BID  . ferumoxytol  510 mg Intravenous Weekly  . furosemide  40 mg Oral BID  . hydrALAZINE  100 mg Oral 3 times per day  . isosorbide mononitrate  60 mg Oral Daily  . lisinopril  10 mg Oral BID  . pantoprazole  40 mg Oral Daily  . predniSONE  40 mg Oral Q breakfast  . sodium chloride  3 mL Intravenous Q12H   . sodium chloride 50 mL/hr at 09/06/14 0739    OBJECTIVE: Physical Exam: Filed Vitals:   09/05/14 2243 09/05/14 2358 09/06/14 0057 09/06/14 0554  BP: 138/63 149/70 149/79 158/77  Pulse: 90 89 93 94  Temp: 97.7 F (36.5 C) 98.4 F (36.9 C) 98.2 F (36.8 C) 97 F (36.1 C)  TempSrc: Oral Oral Oral Oral  Resp: 20 18 18 18   Height:      Weight:    170 lb 1.6 oz (77.157 kg)  SpO2: 98% 99% 99% 98%    Intake/Output Summary (Last 24 hours) at 09/06/14 3762 Last data filed at 09/06/14 0600  Gross per 24 hour  Intake   1930 ml  Output   1100 ml  Net    830 ml    Telemetry reveals sinus rhythm  GEN- The patient is well appearing, alert and oriented x 3 today.   Head- normocephalic, atraumatic Neck- supple, no JVP Lymph- no cervical lymphadenopathy Lungs- Clear to ausculation bilaterally except for basilar rales, normal work of breathing Heart- Regular rate and rhythm, no murmurs, rubs or gallops, PMI not laterally displaced GI- soft, NT, ND, + BS Extremities- no clubbing, cyanosis, or edema Skin- no rash or lesion Psych- euthymic mood, full affect Neuro- strength and sensation are intact  LABS: Basic Metabolic Panel:  Recent Labs  09/05/14 0356 09/06/14 0356  NA 134* 135  K 3.9  4.3  CL 96* 97*  CO2 27 27  GLUCOSE 194* 116*  BUN 49* 47*  CREATININE 1.22 1.47*  CALCIUM 8.7* 8.8*  PHOS 2.8 3.3   Liver Function Tests:  Recent Labs  09/05/14 0356 09/06/14 0356  ALBUMIN 2.9* 2.8*   No results for input(s): LIPASE, AMYLASE in the last 72 hours. CBC:  Recent Labs  09/05/14 0356 09/06/14 0356  WBC 14.3* 10.0  HGB 7.4* 8.8*  HCT 24.3* 28.2*  MCV 84.4 82.0  PLT 284 294   Cardiac Enzymes: No results for input(s): CKTOTAL, CKMB, CKMBINDEX, TROPONINI in the last 72 hours. BNP: Invalid input(s): POCBNP D-Dimer: No results for input(s): DDIMER in the last 72 hours. Hemoglobin A1C: No results for input(s): HGBA1C in the last 72 hours. Fasting Lipid Panel: No results for input(s): CHOL, HDL, LDLCALC, TRIG, CHOLHDL, LDLDIRECT in the last 72 hours. Thyroid Function Tests: No results for input(s): TSH, T4TOTAL, T3FREE, THYROIDAB in the last 72 hours.  Invalid input(s): FREET3 Anemia Panel: No results for input(s): VITAMINB12, FOLATE, FERRITIN, TIBC, IRON, RETICCTPCT in the last 72 hours.  RADIOLOGY: Dg Chest 2 View  08/29/2014   CLINICAL DATA:  Fluid build-up at the legs.  Initial encounter.  EXAM: CHEST  2 VIEW  COMPARISON:  Chest radiograph performed 08/27/2014  FINDINGS: The lungs are well-aerated. Vascular congestion is noted, with increased interstitial markings, concerning for mild interstitial edema. No pleural effusion or pneumothorax is seen.  The heart is enlarged. The patient is status post median sternotomy, with evidence of prior CABG. No acute osseous abnormalities are seen.  IMPRESSION: Vascular congestion and cardiomegaly, with increased interstitial markings, concerning for mild interstitial edema.   Electronically Signed   By: Garald Balding M.D.   On: 08/29/2014 19:35    ASSESSMENT AND PLAN:  Active Problems:   Iron deficiency anemia   Hypertension   Chronic kidney disease (CKD), stage III (moderate)   Moderate to severe pulmonary  hypertension   Polysubstance abuse   CHF exacerbation   CHF (congestive heart failure)   CHF (congestive heart failure), NYHA class I Note plans for angiogram. His weight is up and will likely go up further after angiogram as he is being gently hydrated. Will plan more aggressive diuresis after his procedure.  Cristopher Peru, MD 09/06/2014 9:22 AM

## 2014-09-07 ENCOUNTER — Other Ambulatory Visit: Payer: Self-pay | Admitting: *Deleted

## 2014-09-07 ENCOUNTER — Encounter (HOSPITAL_COMMUNITY)
Admission: EM | Disposition: A | Discharge status: Home / Self Care | Payer: Medicare Other | Source: Home / Self Care | Attending: Internal Medicine

## 2014-09-07 ENCOUNTER — Encounter (HOSPITAL_COMMUNITY): Payer: Self-pay | Admitting: Vascular Surgery

## 2014-09-07 DIAGNOSIS — I5043 Acute on chronic combined systolic (congestive) and diastolic (congestive) heart failure: Principal | ICD-10-CM

## 2014-09-07 DIAGNOSIS — I739 Peripheral vascular disease, unspecified: Secondary | ICD-10-CM

## 2014-09-07 HISTORY — PX: PERIPHERAL VASCULAR CATHETERIZATION: SHX172C

## 2014-09-07 LAB — CBC
HEMATOCRIT: 27.2 % — AB (ref 39.0–52.0)
HEMOGLOBIN: 8.6 g/dL — AB (ref 13.0–17.0)
MCH: 26.1 pg (ref 26.0–34.0)
MCHC: 31.6 g/dL (ref 30.0–36.0)
MCV: 82.7 fL (ref 78.0–100.0)
Platelets: 299 10*3/uL (ref 150–400)
RBC: 3.29 MIL/uL — ABNORMAL LOW (ref 4.22–5.81)
RDW: 17.7 % — ABNORMAL HIGH (ref 11.5–15.5)
WBC: 8.1 10*3/uL (ref 4.0–10.5)

## 2014-09-07 LAB — RENAL FUNCTION PANEL
ANION GAP: 10 (ref 5–15)
Albumin: 2.7 g/dL — ABNORMAL LOW (ref 3.5–5.0)
BUN: 44 mg/dL — ABNORMAL HIGH (ref 6–20)
CALCIUM: 8.7 mg/dL — AB (ref 8.9–10.3)
CO2: 27 mmol/L (ref 22–32)
Chloride: 98 mmol/L — ABNORMAL LOW (ref 101–111)
Creatinine, Ser: 1.17 mg/dL (ref 0.61–1.24)
Glucose, Bld: 80 mg/dL (ref 65–99)
POTASSIUM: 4.7 mmol/L (ref 3.5–5.1)
Phosphorus: 3.6 mg/dL (ref 2.5–4.6)
SODIUM: 135 mmol/L (ref 135–145)

## 2014-09-07 SURGERY — ABDOMINAL AORTOGRAM
Anesthesia: LOCAL

## 2014-09-07 MED ORDER — IODIXANOL 320 MG/ML IV SOLN
INTRAVENOUS | Status: DC | PRN
  Administered 2014-09-07: 147 mL via INTRA_ARTERIAL

## 2014-09-07 MED ORDER — HEPARIN (PORCINE) IN NACL 2-0.9 UNIT/ML-% IJ SOLN
INTRAMUSCULAR | Status: AC
  Filled 2014-09-07: qty 1000

## 2014-09-07 MED ORDER — LIDOCAINE HCL (PF) 1 % IJ SOLN
INTRAMUSCULAR | Status: AC
  Filled 2014-09-07: qty 30

## 2014-09-07 MED ORDER — FENTANYL CITRATE (PF) 100 MCG/2ML IJ SOLN
INTRAMUSCULAR | Status: AC
  Filled 2014-09-07: qty 2

## 2014-09-07 MED ORDER — FENTANYL CITRATE (PF) 100 MCG/2ML IJ SOLN
INTRAMUSCULAR | Status: DC | PRN
  Administered 2014-09-07: 50 ug via INTRAVENOUS

## 2014-09-07 MED ORDER — SODIUM CHLORIDE 0.9 % IV SOLN
1.0000 mL/kg/h | INTRAVENOUS | Status: AC
  Administered 2014-09-07: 1 mL/kg/h via INTRAVENOUS

## 2014-09-07 MED ORDER — FUROSEMIDE 40 MG PO TABS
40.0000 mg | ORAL_TABLET | Freq: Two times a day (BID) | ORAL | Status: DC
  Administered 2014-09-08: 40 mg via ORAL
  Filled 2014-09-07 (×3): qty 1

## 2014-09-07 MED ORDER — ONDANSETRON HCL 4 MG/2ML IJ SOLN: 4.0000 mg | Freq: Four times a day (QID) | INTRAMUSCULAR | Status: DC | PRN

## 2014-09-07 MED ORDER — ENOXAPARIN SODIUM 40 MG/0.4ML ~~LOC~~ SOLN
40.0000 mg | SUBCUTANEOUS | Status: DC
  Filled 2014-09-07 (×2): qty 0.4

## 2014-09-07 MED ORDER — MORPHINE SULFATE 2 MG/ML IJ SOLN
1.0000 mg | INTRAMUSCULAR | Status: DC | PRN
  Administered 2014-09-07: 1 mg via INTRAVENOUS
  Administered 2014-09-07 – 2014-09-08 (×4): 2 mg via INTRAVENOUS
  Filled 2014-09-07 (×5): qty 1

## 2014-09-07 MED ORDER — MIDAZOLAM HCL 2 MG/2ML IJ SOLN
INTRAMUSCULAR | Status: AC
  Filled 2014-09-07: qty 2

## 2014-09-07 MED ORDER — MIDAZOLAM HCL 2 MG/2ML IJ SOLN
INTRAMUSCULAR | Status: DC | PRN
  Administered 2014-09-07: 1 mg via INTRAVENOUS

## 2014-09-07 MED ORDER — PREDNISONE 20 MG PO TABS
30.0000 mg | ORAL_TABLET | Freq: Every day | ORAL | Status: DC
  Administered 2014-09-08: 30 mg via ORAL
  Filled 2014-09-07 (×2): qty 1

## 2014-09-07 SURGICAL SUPPLY — 10 items
CATH ANGIO 5F PIGTAIL 65CM (CATHETERS) ×2 IMPLANT
COVER PRB 48X5XTLSCP FOLD TPE (BAG) ×1 IMPLANT
COVER PROBE 5X48 (BAG) ×1
KIT MICROINTRODUCER STIFF 5F (SHEATH) ×2 IMPLANT
KIT PV (KITS) ×2 IMPLANT
SHEATH PINNACLE 5F 10CM (SHEATH) ×2 IMPLANT
SYR MEDRAD MARK V 150ML (SYRINGE) IMPLANT
TRANSDUCER W/STOPCOCK (MISCELLANEOUS) ×2 IMPLANT
TRAY PV CATH (CUSTOM PROCEDURE TRAY) ×2 IMPLANT
WIRE HITORQ VERSACORE ST 145CM (WIRE) ×2 IMPLANT

## 2014-09-07 NOTE — Progress Notes (Signed)
Advanced Heart Failure Rounding Note  PCP: None  Subjective:    Charles [name] is a 69 y.o. male with history of diastolic CHF (EF 02-40% 11/24/33), polysubstance abuse, CAD, tobacco use, and HTN who presented to [facility] [place] on 08/29/14 with increased peripheral edema and SOB.  He is feeling better since admit.  Says US showed poor LE circulation yesterday and he describes claudication symptoms. Asks how long he'll be in hospital.  Objective:   Weight Range: 168 lb 4.8 oz (76.34 kg) Body mass index is 28.01 kg/(m^2).   Vital Signs:   Temp:  [97.8 F (36.6 C)-98.7 F (37.1 C)] 98.2 F (36.8 C) (06/20 0748) Pulse Rate:  [93-105] 102 (06/20 0748) Resp:  [18] 18 (06/19 2156) BP: (145-153)/(68-82) 145/76 mmHg (06/20 0748) SpO2:  [98 %-100 %] 99 % (06/19 2156) Weight:  [168 lb 4.8 oz (76.34 kg)] 168 lb 4.8 oz (76.34 kg) (06/20 0749) Last BM Date: 09/06/14  Weight change: Filed Weights   09/05/14 0345 09/06/14 0554 09/07/14 0749  Weight: 161 lb 13.1 oz (73.4 kg) 170 lb 1.6 oz (77.157 kg) 168 lb 4.8 oz (76.34 kg)    Intake/Output:   Intake/Output Summary (Last 24 hours) at 09/07/14 1038 Last data filed at 09/07/14 0831  Gross per 24 hour  Intake 2198.5 ml  Output   2125 ml  Net   73.5 ml     Physical Exam: General: Well appearing. No resp difficulty HEENT: normal Neck: supple. JVP 9. Carotids 2+ bilat; no bruits. No lymphadenopathy or thryomegaly appreciated. Cor: PMI nondisplaced. Regular rate & rhythm. No rubs, gallops or murmurs. Lungs: clear Abdomen: soft, nontender, nondistended. No hepatosplenomegaly. No bruits or masses. Good bowel sounds. Extremities: no cyanosis, clubbing, rash. trace LE edema Neuro: alert & orientedx3, cranial nerves grossly intact. moves all 4 extremities w/o difficulty. Affect pleasant   Telemetry: NSR 80-90s  Labs: CBC  Recent Labs  09/06/14 0356 09/07/14 0451  WBC 10.0 8.1  HGB 8.8* 8.6*  HCT 28.2* 27.2*  MCV 82.0 82.7  PLT 294  329   Basic Metabolic Panel  Recent Labs  09/06/14 0356 09/07/14 0451  NA 135 135  K 4.3 4.7  CL 97* 98*  CO2 27 27  GLUCOSE 116* 80  BUN 47* 44*  CALCIUM 8.8* 8.7*  PHOS 3.3 3.6   Liver Function Tests  Recent Labs  09/06/14 0356 09/07/14 0451  ALBUMIN 2.8* 2.7*   No results for input(s): LIPASE, AMYLASE in the last 72 hours. Cardiac Enzymes No results for input(s): CKTOTAL, CKMB, CKMBINDEX, TROPONINI in the last 72 hours.  BNP: BNP (last 3 results)  Recent Labs  07/24/14 0348 08/29/14 1917 08/30/14 0517  BNP 1075.1* 3845.1* 3766.5*    ProBNP (last 3 results)  Recent Labs  01/01/14 1107 02/07/14 0123 03/01/14 2250  PROBNP 3225.0* 2648.0* 7421.0*     D-Dimer No results for input(s): DDIMER in the last 72 hours. Hemoglobin A1C No results for input(s): HGBA1C in the last 72 hours. Fasting Lipid Panel No results for input(s): CHOL, HDL, LDLCALC, TRIG, CHOLHDL, LDLDIRECT in the last 72 hours. Thyroid Function Tests No results for input(s): TSH, T4TOTAL, T3FREE, THYROIDAB in the last 72 hours.  Invalid input(s): FREET3  Other results:     Imaging/Studies:  No results found.  Latest Echo  Latest Cath   Medications:     Scheduled Medications: . aspirin EC  81 mg Oral Daily  . budesonide-formoterol  2 puff Inhalation BID  . citalopram  20 mg Oral  Daily  . cloNIDine  0.2 mg Oral BID  . divalproex  500 mg Oral BID  . ferumoxytol  510 mg Intravenous Weekly  . furosemide  40 mg Oral BID  . hydrALAZINE  100 mg Oral 3 times per day  . isosorbide mononitrate  60 mg Oral Daily  . lisinopril  10 mg Oral BID  . pantoprazole  40 mg Oral Daily  . predniSONE  40 mg Oral Q breakfast  . sodium chloride  3 mL Intravenous Q12H    Infusions: . sodium chloride 50 mL/hr at 09/06/14 0739    PRN Medications: cyclobenzaprine, HYDROcodone-acetaminophen, ipratropium-albuterol, metoprolol, morphine injection, nitroGLYCERIN   Assessment:   1. Acute  on chronic diastolic CHF - EF 24-49% 09/21/28, full report above.- Non compliante 2. Acute COPD exacerbation - Symbicort, duonebs as needed. per primary team 3. IDA Chronic. Iron stores low. Given ferraheme 4. Abdominal pain- CT scan 07/22/14 unremarkable. Cirrhosis, likely 2/2 hepatic congestion. 5. Uncontrolled HTN- non compliant.- On clonidine, hydralazine, imdur, lisinopril, and metoprolol 6. Polysubstance abuse  - last used cocaine 7-10 days ago, UDS positive for cocaine and opiates 7. CAD - hx of 3 vessel CABG 12/2007 8. 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 PAD - ABI 09/02/13 severe PAD  For LE angiography today.   Weight down 2 pounds. Hold lasix for now. Continue current dose hydralazine/imdur/lisinopril.   Length of Stay: 8  CLEGG,AMY NP-C  09/07/2014, 10:38 AM Advanced Heart Failure Team Pager [phone] (M-F; [place])  Please contact [facility] for night-coverage after hours (4p -7a ) and weekends on [email or web address]  Patient seen and examined with Darrick Grinder, NP. We discussed all aspects of the encounter. I agree with the assessment and plan as stated above.   Volume status going up again. Will hold lasix today for LE angiography and then restart IV dosing.   Bensimhon, Daniel,MD 12:44 PM

## 2014-09-07 NOTE — Progress Notes (Signed)
PATIENT: Charles [name]   MRN: [id number] DOB: 1945/09/16    DATE OF PROCEDURE: 09/07/2014  INDICATIONS: Nigel Ericsson is a 69 y.o. male who presented with progressive ischemic symptoms of the left lower extremity. He had rest pain and claudication. Had no femoral pulse on the left with a palpable right femoral pulse. He presents for arteriography.  PROCEDURE:  1. Ultrasound-guided access to the right common femoral artery 2. Aortogram with bilateral iliac arteriogram and bilateral lower extremity runoff  SURGEON: Judeth Cornfield. Scot Dock, MD, FACS  ANESTHESIA: local with sedation   EBL: minimal  TECHNIQUE: The patient was taken to the peripheral vascular lab and received 1 mg of Versed and 50 g of fentanyl. The groins were prepped and draped in usual sterile fashion. Under ultrasound guidance, after the skin was anesthetized, the right common femoral artery was cannulated with a micropuncture needle and a micropuncture sheath introduced over a wire. Of note there was some bulky calcific plaque in the right common femoral artery by duplex. The micropuncture sheath was exchanged for a 5 French sheath over a versa core wire. The wire was advanced into the thoracic aorta and then a pigtail catheter positioned at the L1 vertebral body and flush aortogram obtained. The catheter was in position above the aortic bifurcation and oblique iliac projections were obtained. Next bilateral lower extremity runoff films were obtained. In addition a lateral projection of the aorta was obtained after the catheter was advanced back up above the renal arteries. Next the catheter was positioned above the bifurcation and a pressure was obtained. The catheter was then removed over a wire and a right femoral pressure was obtained. There was no significant gradient at rest on the right side. At the completion the patient had the sheath removed and pressure held for hemostasis. No immediate competitions were  noted.  FINDINGS:  1. Our single renal arteries bilaterally with no significant renal artery stenosis identified. The infrarenal aorta is widely patent. 2. On the right side, there is a calcific plaque in the proximal right common iliac artery which extends up into the distal aorta. This does not appear to be significant however since there was really no resting pressure gradient. The external iliac and hypogastric arteries are open on the right. The common femoral artery has a focal 50% stenosis with an eccentric calcified plaque present. The deep femoral artery is patent. The proximal superficial femoral artery is patent. The superficial femoral arteries then occluded in the proximal thigh and is reconstitution of the above-knee popliteal artery. The popliteal artery is patent. The tibioperoneal trunk is occluded. The anterior tibial artery on the right is patent. The peroneal has moderate disease. The posterior tibial artery is patent on the right. 3. On the left side, there is a calcific eccentric plaque in the proximal left common iliac artery. The common iliac artery is then tortuous with another significant plaque below that and then a third calcific eccentric plaque in the distal left common iliac artery. There is some eccentric plaque in the left common femoral artery. The deep femoral artery is patent. The superficial femoral artery is patent proximally. The superficial femoral artery is occluded in the proximal third of the thigh. There is reconstitution of the above-knee popliteal artery. There is two-vessel runoff on the left via the anterior tibial and peroneal arteries. The posterior tibial artery is occluded.  CLINICAL NOTE: The patient's symptoms are currently stable. For this reason I would recommend conservative treatment. We have discussed the importance  of tobacco cessation and a structured walking program. If his symptoms progressed consideration could be given to a right to left  femorofemoral bypass graft despite the proximal plaque on the right as there was no gradient at rest. In addition I felt that ballooning this proximal stenosis would be associated with significant risk given that the eccentric plaque extended into the distal aorta and could potentially compromise the left side further also. The disease on the left would require extensive stenting given the several areas of disease which would be associated with significant risk given that these are large bulky calcific plaques. The third consideration would be aortofemoral bypass grafting. I can follow this patient as an outpatient. He can be discharged tomorrow from my standpoint.  Deitra Mayo, MD, FACS [facility]  DATE OF DICTATION:   09/07/2014

## 2014-09-07 NOTE — Progress Notes (Signed)
[facility]                                                                              Patient Demographics  Charles [name], is a 69 y.o. male, DOB - October 06, 1945, VOZ:[id number]  Admit date - 08/29/2014   Admitting Physician Quintella Baton, MD  Outpatient Primary MD for the patient is No primary care provider on file.  LOS - 8   Chief Complaint  Patient presents with  . Leg Swelling       Brief HPI   Patient is a 69 year old male with chronic combined systolic and diastolic CHF, known history of iron deficiency anemia, hyperlipidemia, moderate to severe pulmonary hypertension, polysubstance abuse, seizure disorder, CAD, hypertension, seizures presented with leg swelling and pain. Over the last 3 days he's developed increasing swelling on his bilateral legs accompanied by pain. He complained of some SOB but the pain in his legs take precedence. He stated he has occasional legs pains but this is much worse. He has not seen his PCP who he states has retired. He denied wheezing or chest pain. He states he is compliant with his medication but his pain now causes his blood pressure to be uncontrolled.     Assessment & Plan    Principal problem  Acute on chronic combined CHF with abdominal bloating and peripheral edema; likely due to medical noncompliance, cocaine use and dietary indiscretion. Patient has had recurrent admissions since the beginning of this year for the same symptoms, admitted 2/16,  3/16 (twice in a month), 4/16, 5/16 and current admission - 2-D echo in March 2016 had shown EF of 45-50%, diffuse hypokinesis and grade 2 diastolic dysfunction - Continue Lasix, negative balance of 3.3 L - Appreciate cardiology and vascular surgery recommendations   Active problems Leg pains with severe peripheral vascular disease - ABI's show severe peripheral vascular disease, right 0.42 and left 0.22 - Appreciate vascular surgery recommendations, planned arteriogram   Acute COPD exacerbation No wheezing, continue Symbicort, DuoNeb's, transitioned to oral prednisone, taper tomorrow    Chronic history of iron deficiency anemia - placed on SCDs anemia panel showed iron of 27, ferritin 40, saturation ratio 7  - H&H stable, status post 2 units packed RBCs   Abdominal discomfort- has abdominal bloating likely due to #1, no pain currently  Recent CT scan (on 07/22/14) showed no acute abnormalities, cirrhotic liver with associated small ascites.Likely due to hepatic congestion   Uncontrolled hypertension Likely secondary to nonadherence to medications, Now improving  Polysubstance abuse Reported last cocaine use was 7-10 days ago.Urine drug screen positive for cocaine on 6/12 and has been positive on every admission. Patient counseled strongly on cocaine cessation, he agrees to cut down  CKD stage III Baseline creatinine about 1.3, currently creatinine function stable  Seizure disorder Patient was on Dilantin which was discontinued last May, no seizure-like activity. Patient does not drive.  CAD status post CABG Continue aspirin, not on beta blockers secondary to ongoing cocaine abuse.  Cirrhotic liver Noted on previous CT scan from May 2016  Code Status: Full code   Family Communication: Discussed in detail with the patient, all imaging  results, lab results explained to the patient   Disposition Plan: Pending arteriogram   Time Spent in minutes 25  minutes  Procedures  None  Consults   None  DVT Prophylaxis   SCD's  Medications  Scheduled Meds: . aspirin EC  81 mg Oral Daily  . budesonide-formoterol  2 puff Inhalation BID  . citalopram  20 mg Oral Daily  . cloNIDine  0.2 mg Oral BID  . divalproex  500 mg Oral BID  . ferumoxytol  510 mg Intravenous Weekly  . [START ON 09/08/2014] furosemide  40 mg Oral BID  . hydrALAZINE  100 mg Oral 3 times per day  . isosorbide mononitrate  60 mg Oral Daily  . lisinopril  10 mg Oral BID  .  pantoprazole  40 mg Oral Daily  . predniSONE  40 mg Oral Q breakfast  . sodium chloride  3 mL Intravenous Q12H   Continuous Infusions: . sodium chloride 50 mL/hr at 09/06/14 0739   PRN Meds:.cyclobenzaprine, HYDROcodone-acetaminophen, ipratropium-albuterol, metoprolol, morphine injection, nitroGLYCERIN   Antibiotics   Anti-infectives    None        Subjective:   Charles [name] was seen and examined today. Pain in the legs controlled, waiting for the arteriogram today, no other complaints.  Patient denies dizziness, chest pain, shortness of breath, abdominal pain, N/V/D/C, new weakness, numbess, tingling. No acute events overnight.    Objective:   Blood pressure 145/76, pulse 102, temperature 98.2 F (36.8 C), temperature source Oral, resp. rate 18, height 5\' 5"  (1.651 m), weight 76.34 kg (168 lb 4.8 oz), SpO2 99 %.  Wt Readings from Last 3 Encounters:  09/07/14 76.34 kg (168 lb 4.8 oz)  07/26/14 73.8 kg (162 lb 11.2 oz)  07/05/14 76.114 kg (167 lb 12.8 oz)     Intake/Output Summary (Last 24 hours) at 09/07/14 1046 Last data filed at 09/07/14 0831  Gross per 24 hour  Intake 1976.5 ml  Output   1925 ml  Net   51.5 ml    Exam  General: Alert and oriented x 3, NAD  HEENT:  PERRLA, EOMI,  Neck: Supple, no JVD  CVS: S1 S2 auscultated, RRR  Respiratory:  Dec BS at bases  Abdomen : Soft, nontender, nondistended, + bowel sounds  Ext: no c/c, 1+ edema, tracking device on the right leg   Neuro: no new deficits.   Skin: No rashes  Psych:  alert and oriented x3    Data Review   Micro Results Recent Results (from the past 240 hour(s))  MRSA PCR Screening     Status: Abnormal   Collection Time: 08/30/14 12:59 AM  Result Value Ref Range Status   MRSA by PCR POSITIVE (A) NEGATIVE Final    Comment:        The GeneXpert MRSA Assay (FDA approved for NASAL specimens only), is one component of a comprehensive MRSA colonization surveillance program. It is  not intended to diagnose MRSA infection nor to guide or monitor treatment for MRSA infections. RESULT CALLED TO, READ BACK BY AND VERIFIED WITH: HARAWAY,J RN 0424 08/30/14 MITCHELL,L     Radiology Reports Dg Chest 2 View  08/29/2014   CLINICAL DATA:  Fluid build-up at the legs.  Initial encounter.  EXAM: CHEST  2 VIEW  COMPARISON:  Chest radiograph performed 08/27/2014  FINDINGS: The lungs are well-aerated. Vascular congestion is noted, with increased interstitial markings, concerning for mild interstitial edema. No pleural effusion or pneumothorax is seen.  The heart is enlarged. The  patient is status post median sternotomy, with evidence of prior CABG. No acute osseous abnormalities are seen.  IMPRESSION: Vascular congestion and cardiomegaly, with increased interstitial markings, concerning for mild interstitial edema.   Electronically Signed   By: Garald Balding M.D.   On: 08/29/2014 19:35    CBC  Recent Labs Lab 09/03/14 0455 09/04/14 0411 09/05/14 0356 09/06/14 0356 09/07/14 0451  WBC 9.2 17.1* 14.3* 10.0 8.1  HGB 7.5* 7.8* 7.4* 8.8* 8.6*  HCT 24.3* 25.5* 24.3* 28.2* 27.2*  PLT 273 280 284 294 299  MCV 82.9 84.4 84.4 82.0 82.7  MCH 25.6* 25.8* 25.7* 25.6* 26.1  MCHC 30.9 30.6 30.5 31.2 31.6  RDW 16.8* 16.7* 16.9* 17.8* 17.7*    Chemistries   Recent Labs Lab 09/03/14 0455 09/04/14 0411 09/05/14 0356 09/06/14 0356 09/07/14 0451  NA 133* 134* 134* 135 135  K 3.8 3.8 3.9 4.3 4.7  CL 93* 94* 96* 97* 98*  CO2 27 29 27 27 27   GLUCOSE 216* 150* 194* 116* 80  BUN 38* 52* 49* 47* 44*  CREATININE 1.38* 1.40* 1.22 1.47* 1.17  CALCIUM 8.4* 8.7* 8.7* 8.8* 8.7*   ------------------------------------------------------------------------------------------------------------------ estimated creatinine clearance is 56 mL/min (by C-G formula based on Cr of 1.17). ------------------------------------------------------------------------------------------------------------------ No  results for input(s): HGBA1C in the last 72 hours. ------------------------------------------------------------------------------------------------------------------ No results for input(s): CHOL, HDL, LDLCALC, TRIG, CHOLHDL, LDLDIRECT in the last 72 hours. ------------------------------------------------------------------------------------------------------------------ No results for input(s): TSH, T4TOTAL, T3FREE, THYROIDAB in the last 72 hours.  Invalid input(s): FREET3 ------------------------------------------------------------------------------------------------------------------ No results for input(s): VITAMINB12, FOLATE, FERRITIN, TIBC, IRON, RETICCTPCT in the last 72 hours.  Coagulation profile No results for input(s): INR, PROTIME in the last 168 hours.  No results for input(s): DDIMER in the last 72 hours.  Cardiac Enzymes No results for input(s): CKMB, TROPONINI, MYOGLOBIN in the last 168 hours.  Invalid input(s): CK ------------------------------------------------------------------------------------------------------------------ Invalid input(s): POCBNP  No results for input(s): GLUCAP in the last 72 hours.   Nadia Viar M.D. [facility] 09/07/2014, 10:46 AM  Pager: [phone]   Between 7am to 7pm - call Pager - [phone]  After 7pm go to [email or web address] - password [email or web address]  Call night coverage person covering after 7pm

## 2014-09-07 NOTE — H&P (View-Only) (Signed)
Hospital Consult  Reason for consult: PAD, abnormal ABIs Consulting physician: Dr. Tana Coast [facility])  History of Present Illness  Charles [name] is a 69 y.o. (Apr 04, 1945) male who we've been consulted regarding bilateral leg pain, left >right x 1 year.  The pain has been worsening. He describes a burning and cramping pain like he's been "exercising" in his left hip and buttock region and bilateral calves after ambulating 50 feet. He denies pain in his right hip/buttock. The pain improves with rest. He also says pain medicine helps his pain as well. He describes intermittent numbness in his feet and calves at night. He denies any history of non healing wounds. He says his pain is limiting his ambulation now. He lives in [place] with his daughter. He is a current smoker. He also has polysubstance abuse including cocaine and THC use. He has now cut down his cocaine usage to once a week. He has a left ankle bracelet on due to failure to show at court proceeding for shoplifting.   He presented to the [facility] ED with complaints of bilateral leg pain, swelling and shortness of breath. He has a PMH of diastolic CHF, CAD s/p CABG (2009), hyperlipidemia, hypertension, diabetes and CKD stage II. He has no history of arrhthmyias.  On ROS, he denies any chest pain or SOB. He does note DOE. He denies fever, chill, weakness, abdominal pain, dysuria. Full ROS below.   Past Medical History  Diagnosis Date  . Iron deficiency anemia   . Chronic combined systolic and diastolic CHF, NYHA class 2     a. 11/2013 Echo: EF 40-45%, basl-mid inflat AK, Gr 2 DD.  [name] Hyperlipidemia LDL goal <70   . Obstructive sleep apnea     a. not on home CPAP  . Ischemic cardiomyopathy     a. 11/2013 Echo: EF 40-45%, basl-mid inflat AK, Gr 2 DD, mild AI, mod dil LA/RA, mod reduced RV fxn, PASP 15mmHg.  [name] Homelessness   . Moderate to severe pulmonary hypertension 03/2010    a. PA peak pressure of 76 mmHg (per 2D Echo 03/2010)  .  Polysubstance abuse     a. cocaine, THC  . AV malformation of gastrointestinal tract     a. w/ h/o GIB.  [name] Family history of early CAD   . DDD (degenerative disc disease), lumbar   . Peripheral vascular disease   . Seizure disorder   . Hypertension   . CAD (coronary artery disease)     a. s/p 3-vessel CABG (12/2007) // 100% RCA stenosis with collaterals from left system. Severe bifurcation lesions of proxima CXA and OM. Moderate LAD disease - followed by Dr. Wyline Copas in [facility];  b. 11/2013 Myoview: Large fixed inf defect w/o ischemia, EF 35%.  . Diabetes   . Chronic kidney disease (CKD), stage III (moderate)     a. BL SCr 1.5-1.6  . Renal artery stenosis   . Seizures     Past Surgical History  Procedure Laterality Date  . Apc  03/2010    To treat small bowel AVMs  . Esophagogastroduodenoscopy N/A 08/14/2012    Procedure: ESOPHAGOGASTRODUODENOSCOPY (EGD);  Surgeon: [name], MD;  Location: [facility] ENDOSCOPY;  Service: Endoscopy;  Laterality: N/A;  . Hot hemostasis N/A 08/14/2012    Procedure: HOT HEMOSTASIS (ARGON PLASMA COAGULATION/BICAP);  Surgeon: [name], MD;  Location: [facility] ENDOSCOPY;  Service: Endoscopy;  Laterality: N/A;  . Esophagogastroduodenoscopy (egd) with propofol N/A 08/04/2013    Procedure: ESOPHAGOGASTRODUODENOSCOPY (EGD) WITH PROPOFOL;  Surgeon: [name], MD;  Location: [facility] ENDOSCOPY;  Service: Endoscopy;  Laterality: N/A;  . Coronary artery bypass graft  12/2007  . Esophagogastroduodenoscopy (egd) with propofol N/A 06/08/2014    Procedure: ESOPHAGOGASTRODUODENOSCOPY (EGD) WITH PROPOFOL;  Surgeon: Milus Banister, MD;  Location: [place];  Service: Endoscopy;  Laterality: N/A;    History   Social History  . Marital Status: Widowed    Spouse Name: N/A  . Number of Children: 2  . Years of Education: N/A   Occupational History  . Unemployed     previously worked in [employer]  .     Social History Main Topics  . Smoking status: Current Some Day  Smoker -- 0.25 packs/day for 56 years    Types: Cigarettes  . Smokeless tobacco: Never Used  . Alcohol Use: 0.0 oz/week    0 Standard drinks or equivalent per week     Comment: occasionally drinks, a few times a month  . Drug Use: 1.00 per week    Special: Cocaine, Marijuana     Comment: uses cocaine once/wk at least.  Last used on Monday, May 30th  . Sexual Activity: No     Comment: pt states "I smoked a little small piece on 02/25/14   Other Topics Concern  . Not on file   Social History Narrative   Lives in [place]. Originally from [place], has been in the [place] since 1980s      **11/2013 - says that he lives with dtr in [facility].**                      Family History  Problem Relation Age of Onset  . Heart disease Mother     unknown type  . Heart disease Father 47    died of MI at 34yo  . Heart disease Paternal Grandfather 26    died of MI  . Heart disease    . Heart disease Brother 30    No current facility-administered medications on file prior to encounter.   Current Outpatient Prescriptions on File Prior to Encounter  Medication Sig Dispense Refill  . albuterol (PROVENTIL HFA;VENTOLIN HFA) 108 (90 BASE) MCG/ACT inhaler Inhale 2 puffs into the lungs every 6 (six) hours as needed for wheezing or shortness of breath. 1 Inhaler 2  . aspirin 81 MG tablet Take 81 mg by mouth daily.    . citalopram (CELEXA) 20 MG tablet Take 20 mg by mouth daily.    . cloNIDine (CATAPRES) 0.2 MG tablet Take 1 tablet (0.2 mg total) by mouth 2 (two) times daily. 30 tablet 0  . cyclobenzaprine (FLEXERIL) 5 MG tablet Take 1 tablet (5 mg total) by mouth 3 (three) times daily as needed for muscle spasms (chest and leg pains). 30 tablet 0  . divalproex (DEPAKOTE ER) 500 MG 24 hr tablet Take 500 mg by mouth 2 (two) times daily.    . ferrous sulfate 325 (65 FE) MG tablet Take 325 mg by mouth 3 (three) times daily with meals.     . furosemide (LASIX) 40 MG tablet Take 1 tablet  (40 mg total) by mouth 2 (two) times daily. 30 tablet 0  . hydrALAZINE (APRESOLINE) 100 MG tablet Take 100 mg by mouth 3 (three) times daily.    . isosorbide mononitrate (IMDUR) 30 MG 24 hr tablet Take 2 tablets (60 mg total) by mouth daily. 30 tablet 0  . lisinopril (PRINIVIL,ZESTRIL) 20 MG tablet Take 0.5 tablets (  10 mg total) by mouth 2 (two) times daily.    . pantoprazole (PROTONIX) 40 MG tablet Take 40 mg by mouth daily.     . nitroGLYCERIN (NITROSTAT) 0.4 MG SL tablet Place 1 tablet (0.4 mg total) under the tongue every 5 (five) minutes as needed for chest pain (upto max 3 doses at one time.). 30 tablet 0    Allergies  Allergen Reactions  . Motrin [Ibuprofen] Other (See Comments)    Affects kidneys  . Tylenol [Acetaminophen] Other (See Comments)    Affects kidneys    REVIEW OF SYSTEMS:  (Positives checked otherwise negative)  CARDIOVASCULAR:  [ ]  chest pain, [ ]  chest pressure, [ ]  palpitations, [ ]  shortness of breath when laying flat, [x ] shortness of breath with exertion,   [x ] pain in legs when walking, [ ]  pain in feet when laying flat, [ ]  history of blood clot in veins (DVT), [ ]  history of phlebitis, [x ] swelling in legs, [ ]  varicose veins  PULMONARY:  [ ]  productive cough, [ ]  asthma, [ ]  wheezing  NEUROLOGIC:  [ ]  weakness in arms or legs, [ ]  numbness in arms or legs, [ ]  difficulty speaking or slurred speech, [ ]  temporary loss of vision in one eye, [ ]  dizziness  HEMATOLOGIC:  [ ]  bleeding problems, [ ]  problems with blood clotting too easily  MUSCULOSKEL:  [ ]  joint pain, [ ]  joint swelling  GASTROINTEST:  [ ]   Vomiting blood, [ ]   Blood in stool     GENITOURINARY:  [ ]   Burning with urination, [ ]   Blood in urine  PSYCHIATRIC:  [ ]  history of major depression  INTEGUMENTARY:  [ ]  rashes, [ ]  ulcers  CONSTITUTIONAL:  [ ]  fever, [ ]  chills  For VQI Use Only  PRE-ADM LIVING: Home  AMB STATUS: Ambulatory  CAD Sx: None  PRIOR CHF: Mild  STRESS TEST:  [ ]  No, [ ]  Normal, [ ]  + ischemia, [ ]  + MI, [ ]  Both   Physical Examination Filed Vitals:   09/03/14 2150 09/03/14 2155 09/04/14 0500 09/04/14 1057  BP:  152/69 163/94 130/70  Pulse:  81 86 85  Temp:  98.7 F (37.1 C) 97.6 F (36.4 C)   TempSrc:  Oral Oral   Resp:  19 18   Height:      Weight:   163 lb 4.8 oz (74.072 kg)   SpO2: 94% 97% 100%    Body mass index is 27.17 kg/(m^2).  General: A&O x 3, WDWN male in NAD  Head: [place]/AT  Neck: Supple  Pulmonary: Sym exp, good air movt, CTAB, no rales, rhonchi, & wheezing  Cardiac: RRR, Nl S1, S2, no Murmurs, rubs or gallops, no carotid bruits  Vascular: Vessel Right Left  Radial 2+ 2+  Femoral 2+ Not palpable  Popliteal Not palpable Not palpable  PT Not palpable Not palpable  DP Not palpable Not palpable   Gastrointestinal: soft, NTND  Musculoskeletal: M/S 5/5 throughout, Extremities without ischemic changes.   Neurologic: CN 2-12 intact grossly intact. Pain and light touch intact in extremities bilaterally  Psychiatric: Judgment intact, Mood & affect appropriatefor pt's clinical situation  Dermatologic: No ulcers seen on extremities. Legs and arms with scattered hyperpigmented scars.   Non-Invasive Vascular Imaging  ABI (Date: 09/04/2014)  R: 0.42, monophasic DP and PT waveforms  L: 0.22, monophasic DP waveforms, absent PT   Medical Decision Making  Charles [name] is a 69 y.o.  male who presents with bilateral lower extremity claudication, left worse than right. He has left hip and buttock claudication in addition to bilateral calf claudication. His ABIs are left 0.22, right 0.42. He has a nonpalpable left femoral pulse, normal on right. 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 He has intact motor and sensory function to his legs bilaterally.  He will need an arteriogram with bilateral runoff and possible intervention for further  intervention. No urgency to do study at this time given chronicity of symptoms.  He does have a history of cocaine abuse, CAD and CHF. If he were to require future surgery, he would need to undergo formal cardiac clearance.  Dr. Scot Dock to see patient and to determine timing of diagnostic studies.   Virgina Jock, PA-C [facility] Office: [phone] Pager: [phone]  09/04/2014, 11:07 AM  Agree with above. He has chronic multilevel arterial occlusive disease. He will need an arteriogram electively. Given his social situation it may be best to do this while he is in the hospital and I could try to schedule this for Monday. However, if he is discharged before that certainly we Arrange it in the future as an outpatient. I have reviewed with the patient the indications for arteriography. In addition, I have reviewed the potential complications of arteriography including but not limited to: Bleeding, arterial injury, arterial thrombosis, dye action, renal insufficiency, or other unpredictable medical problems. I have explained to the patient that if we find disease amenable to angioplasty we could potentially address this at the same time. I have discussed the potential complications of angioplasty and stenting, including but not limited to: Bleeding, arterial thrombosis, arterial injury, dissection, or the need for surgical intervention.  Deitra Mayo, MD, [place] [phone] Office: [phone] '

## 2014-09-07 NOTE — Progress Notes (Signed)
Patient remains in the hospital- currently having a procedure this morning- Abdominal Aortogram.  No current change in d/c plan. Will return home when medically stable.  Refuses home health services or placement.  He does not qualify for SNF level of care and is not willing to release his monthly check for placement.  CSW will continue to monitor for any further needs or questions. Lorie Phenix. Pauline Good, [place]

## 2014-09-07 NOTE — Interval H&P Note (Signed)
History and Physical Interval Note:  09/07/2014 11:37 AM  Charles [name]  has presented today for surgery, with the diagnosis of pvd with bilateral lower extremity claudication   The various methods of treatment have been discussed with the patient and family. After consideration of risks, benefits and other options for treatment, the patient has consented to  Procedure(s): Abdominal Aortogram (N/A) as a surgical intervention .  The patient's history has been reviewed, patient examined, no change in status, stable for surgery.  I have reviewed the patient's chart and labs.  Questions were answered to the patient's satisfaction.     Deitra Mayo

## 2014-09-08 LAB — RENAL FUNCTION PANEL
Albumin: 2.8 g/dL — ABNORMAL LOW (ref 3.5–5.0)
Anion gap: 10 (ref 5–15)
BUN: 39 mg/dL — AB (ref 6–20)
CO2: 26 mmol/L (ref 22–32)
Calcium: 8.5 mg/dL — ABNORMAL LOW (ref 8.9–10.3)
Chloride: 99 mmol/L — ABNORMAL LOW (ref 101–111)
Creatinine, Ser: 1.21 mg/dL (ref 0.61–1.24)
GFR calc non Af Amer: 59 mL/min — ABNORMAL LOW (ref >60)
Glucose, Bld: 134 mg/dL — ABNORMAL HIGH (ref 65–99)
PHOSPHORUS: 4.2 mg/dL (ref 2.5–4.6)
POTASSIUM: 4.4 mmol/L (ref 3.5–5.1)
SODIUM: 135 mmol/L (ref 135–145)

## 2014-09-08 LAB — CBC
HEMATOCRIT: 26.7 % — AB (ref 39.0–52.0)
HEMOGLOBIN: 8.4 g/dL — AB (ref 13.0–17.0)
MCH: 26.5 pg (ref 26.0–34.0)
MCHC: 31.5 g/dL (ref 30.0–36.0)
MCV: 84.2 fL (ref 78.0–100.0)
PLATELETS: 286 10*3/uL (ref 150–400)
RBC: 3.17 MIL/uL — AB (ref 4.22–5.81)
RDW: 17.8 % — ABNORMAL HIGH (ref 11.5–15.5)
WBC: 7.2 10*3/uL (ref 4.0–10.5)

## 2014-09-08 MED ORDER — FUROSEMIDE 40 MG PO TABS: 40.0000 mg | ORAL_TABLET | Freq: Two times a day (BID) | ORAL | Status: DC

## 2014-09-08 MED ORDER — BUDESONIDE-FORMOTEROL FUMARATE 80-4.5 MCG/ACT IN AERO: 2.0000 | INHALATION_SPRAY | Freq: Two times a day (BID) | RESPIRATORY_TRACT | Status: AC

## 2014-09-08 MED ORDER — HYDROCODONE-ACETAMINOPHEN 5-325 MG PO TABS: 2.0000 | ORAL_TABLET | Freq: Four times a day (QID) | ORAL | Status: DC | PRN

## 2014-09-08 MED ORDER — PREDNISONE 10 MG PO TABS: ORAL_TABLET | ORAL | Status: DC

## 2014-09-08 MED ORDER — ASPIRIN EC 325 MG PO TBEC
325.0000 mg | DELAYED_RELEASE_TABLET | Freq: Every day | ORAL | Status: DC
Start: 2014-09-08 — End: 2014-09-18

## 2014-09-08 MED ORDER — ALBUTEROL SULFATE HFA 108 (90 BASE) MCG/ACT IN AERS: 2.0000 | INHALATION_SPRAY | Freq: Four times a day (QID) | RESPIRATORY_TRACT | Status: DC | PRN

## 2014-09-08 MED FILL — Lidocaine HCl Local Preservative Free (PF) Inj 1%: INTRAMUSCULAR | Qty: 30 | Status: AC

## 2014-09-08 MED FILL — Heparin Sodium (Porcine) 2 Unit/ML in Sodium Chloride 0.9%: INTRAMUSCULAR | Qty: 1000 | Status: AC

## 2014-09-08 NOTE — Discharge Summary (Signed)
Physician Discharge Summary   Patient ID: Benaiah Behan MRN: [id number] DOB/AGE: [birth year] 69 y.o.  Admit date: 08/29/2014 Discharge date: 09/08/2014  Primary Care Physician:  No primary care provider on file.  Discharge Diagnoses:    Acute on chronic combined CHF  Severe medical noncompliance  Severe peripheral vascular disease  Acute COPD exacerbation  Cocaine abuse  . Chronic kidney disease (CKD), stage III (moderate) . Hypertension . Moderate to severe pulmonary hypertension . Polysubstance abuse . Iron deficiency anemia  Consults:  Cardiology, Dr. Haroldine Laws Vascular surgery, Dr. Scot Dock   Recommendations for Outpatient Follow-up:  Patient was recommended strongly to follow with the cardiology and vascular surgery and be compliant with his medical care.    DIET: Heart healthy diet    Allergies:   Allergies  Allergen Reactions  . Motrin [Ibuprofen] Other (See Comments)    Affects kidneys  . Tylenol [Acetaminophen] Other (See Comments)    Affects kidneys     Discharge Medications:   Medication List    STOP taking these medications        aspirin 81 MG tablet  Replaced by:  aspirin EC 325 MG tablet      TAKE these medications        albuterol 108 (90 BASE) MCG/ACT inhaler  Commonly known as:  PROVENTIL HFA;VENTOLIN HFA  Inhale 2 puffs into the lungs every 6 (six) hours as needed for wheezing or shortness of breath.     albuterol 108 (90 BASE) MCG/ACT inhaler  Commonly known as:  PROVENTIL HFA;VENTOLIN HFA  Inhale 2 puffs into the lungs every 6 (six) hours as needed for wheezing or shortness of breath.     aspirin EC 325 MG tablet  Take 1 tablet (325 mg total) by mouth daily.     budesonide-formoterol 80-4.5 MCG/ACT inhaler  Commonly known as:  SYMBICORT  Inhale 2 puffs into the lungs 2 (two) times daily.     citalopram 20 MG tablet  Commonly known as:  CELEXA  Take 20 mg by mouth daily.     cloNIDine 0.2 MG tablet  Commonly known  as:  CATAPRES  Take 1 tablet (0.2 mg total) by mouth 2 (two) times daily.     cyclobenzaprine 5 MG tablet  Commonly known as:  FLEXERIL  Take 1 tablet (5 mg total) by mouth 3 (three) times daily as needed for muscle spasms (chest and leg pains).     divalproex 500 MG 24 hr tablet  Commonly known as:  DEPAKOTE ER  Take 500 mg by mouth 2 (two) times daily.     ferrous sulfate 325 (65 FE) MG tablet  Take 325 mg by mouth 3 (three) times daily with meals.     furosemide 40 MG tablet  Commonly known as:  LASIX  Take 1 tablet (40 mg total) by mouth 2 (two) times daily.     hydrALAZINE 100 MG tablet  Commonly known as:  APRESOLINE  Take 100 mg by mouth 3 (three) times daily.     HYDROcodone-acetaminophen 5-325 MG per tablet  Commonly known as:  NORCO/VICODIN  Take 2 tablets by mouth every 6 (six) hours as needed for moderate pain.     isosorbide mononitrate 30 MG 24 hr tablet  Commonly known as:  IMDUR  Take 2 tablets (60 mg total) by mouth daily.     KLOR-CON M20 20 MEQ tablet  Generic drug:  potassium chloride SA  Take 20 mEq by mouth daily.     lisinopril 20  MG tablet  Commonly known as:  PRINIVIL,ZESTRIL  Take 0.5 tablets (10 mg total) by mouth 2 (two) times daily.     nitroGLYCERIN 0.4 MG SL tablet  Commonly known as:  NITROSTAT  Place 1 tablet (0.4 mg total) under the tongue every 5 (five) minutes as needed for chest pain (upto max 3 doses at one time.).     pantoprazole 40 MG tablet  Commonly known as:  PROTONIX  Take 40 mg by mouth daily.     predniSONE 10 MG tablet  Commonly known as:  DELTASONE  Prednisone dosing: Take  Prednisone 30mg  (3 tabs) x 2 days, then 20mg  (2 tabs) x 3days, then 10mg  (1 tab) x 3days, then OFF.         Brief H and P: For complete details please refer to admission H and P, but in brief Patient is a 69 year old male with chronic combined systolic and diastolic CHF, known history of iron deficiency anemia, hyperlipidemia, moderate to  severe pulmonary hypertension, polysubstance abuse, seizure disorder, CAD, hypertension, seizures presented with leg swelling and pain. Over the last 3 days he's developed increasing swelling on his bilateral legs accompanied by pain. He complained of some SOB but the pain in his legs take precedence. He stated he has occasional legs pains but this is much worse. He has not seen his PCP who he states has retired. He denied wheezing or chest pain. He states he is compliant with his medication but his pain now causes his blood pressure to be uncontrolled.   Hospital Course:  Acute on chronic combined CHF with abdominal bloating and peripheral edema; likely due to medical noncompliance, cocaine use and dietary indiscretion. Patient has had recurrent admissions since the beginning of this year for the same symptoms, admitted 2/16, 3/16 (twice in a month), 4/16, 5/16 and current admission  2-D echo in March 2016 had shown EF of 45-50%, diffuse hypokinesis and grade 2 diastolic dysfunction. Cardiology was consulted, patient was placed on aggressive IV diuresis. He did well with diuresis however needed overnight fluid hydration for the arteriogram. Currently weight down from 178 to 170lbs at discharge. Follow-up appointment with cardiology also arranged and patient was strongly counseled for medical compliance and quit cocaine use.   Leg pains with severe peripheral vascular disease - ABI's show severe peripheral vascular disease, right 0.42 and left 0.22. Patient underwent arteriogram on 6/20, recommended aggressive conservative medical management, tobacco cessation. If symptoms progressed then a right-to-left femorofemoral femoropopliteal bypass at that point. Patient was reminded to follow up outpatient with Dr. Scot Dock  Acute COPD exacerbation No wheezing, patient was discharged on Symbicort, albuterol, prednisone taper  Chronic history of iron deficiency anemia - anemia panel showed iron of 27,  ferritin 40, saturation ratio 7  - H&H stable, status post 2 units packed RBCs, outpatient GI workup  Abdominal discomfort- has abdominal bloating likely due to #1, no pain currently  Recent CT scan (on 07/22/14) showed no acute abnormalities, cirrhotic liver with associated small ascites.Likely due to hepatic congestion   Uncontrolled hypertension Likely secondary to nonadherence to medications, Now improving  Polysubstance abuse Reported last cocaine use was 7-10 days ago.Urine drug screen positive for cocaine on 6/12 and has been positive on every admission. Patient counseled strongly on cocaine cessation, he agrees to cut down  CKD stage III Baseline creatinine about 1.3, currently creatinine function stable  Seizure disorder Patient was on Dilantin which was discontinued last May, no seizure-like activity. Patient does not drive.  CAD  status post CABG Continue aspirin, not on beta blockers secondary to ongoing cocaine abuse.  Cirrhotic liver Noted on previous CT scan from May 2016  Day of Discharge BP 179/94 mmHg  Pulse 92  Temp(Src) 97.9 F (36.6 C) (Oral)  Resp 20  Ht 5\' 5"  (1.651 m)  Wt 79.153 kg (174 lb 8 oz)  BMI 29.04 kg/m2  SpO2 98%  Physical Exam: General: Alert and awake oriented x3 not in any acute distress. HEENT: anicteric sclera, pupils reactive to light and accommodation CVS: S1-S2 clear no murmur rubs or gallops Chest: clear to auscultation bilaterally, no wheezing rales or rhonchi Abdomen: soft nontender, nondistended, normal bowel sounds Extremities: no cyanosis, clubbing, trace edema noted bilaterally Neuro: Cranial nerves II-XII intact, no focal neurological deficits   The results of significant diagnostics from this hospitalization (including imaging, microbiology, ancillary and laboratory) are listed below for reference.    LAB RESULTS: Basic Metabolic Panel:  Recent Labs Lab 09/07/14 0451 09/08/14 0444  NA 135 135  K 4.7 4.4  CL 98*  99*  CO2 27 26  GLUCOSE 80 134*  BUN 44* 39*  CREATININE 1.17 1.21  CALCIUM 8.7* 8.5*  PHOS 3.6 4.2   Liver Function Tests:  Recent Labs Lab 09/07/14 0451 09/08/14 0444  ALBUMIN 2.7* 2.8*   No results for input(s): LIPASE, AMYLASE in the last 168 hours. No results for input(s): AMMONIA in the last 168 hours. CBC:  Recent Labs Lab 09/07/14 0451 09/08/14 0444  WBC 8.1 7.2  HGB 8.6* 8.4*  HCT 27.2* 26.7*  MCV 82.7 84.2  PLT 299 286   Cardiac Enzymes: No results for input(s): CKTOTAL, CKMB, CKMBINDEX, TROPONINI in the last 168 hours. BNP: Invalid input(s): POCBNP CBG: No results for input(s): GLUCAP in the last 168 hours.  Significant Diagnostic Studies:  Dg Chest 2 View  08/29/2014   CLINICAL DATA:  Fluid build-up at the legs.  Initial encounter.  EXAM: CHEST  2 VIEW  COMPARISON:  Chest radiograph performed 08/27/2014  FINDINGS: The lungs are well-aerated. Vascular congestion is noted, with increased interstitial markings, concerning for mild interstitial edema. No pleural effusion or pneumothorax is seen.  The heart is enlarged. The patient is status post median sternotomy, with evidence of prior CABG. No acute osseous abnormalities are seen.  IMPRESSION: Vascular congestion and cardiomegaly, with increased interstitial markings, concerning for mild interstitial edema.   Electronically Signed   By: Garald Balding M.D.   On: 08/29/2014 19:35    2D ECHO:   Disposition and Follow-up:     Discharge Instructions    ([place]) Call MD:  Anytime you have any of the following symptoms: 1) 3 pound weight gain in 24 hours or 5 pounds in 1 week 2) shortness of breath, with or without a dry hacking cough 3) swelling in the hands, feet or stomach 4) if you have to sleep on extra pillows at night in order to breathe.    Complete by:  As directed      Diet - low sodium heart healthy    Complete by:  As directed      Increase activity slowly    Complete by:  As  directed             DISPOSITION: Home   DISCHARGE FOLLOW-UP Follow-up Information    Follow up with Glori Bickers, MD. Go on 09/14/2014.   Specialty:  Cardiology   Why:  at 0930 am in the [facility]--gate code 0800--please  bring all medications to appt.   Contact information:   [address] [place] [address] [phone]       Follow up with Deitra Mayo, MD. Schedule an appointment as soon as possible for a visit in 2 weeks.   Specialties:  Vascular Surgery, Cardiology   Why:  for hospital follow-up Called and left message   Contact information:   [place] [address] [phone]        Time spent on Discharge: 35 mins   Signed:   RAI,RIPUDEEP M.D. [facility] 09/08/2014, 1:17 PM Pager: [phone]

## 2014-09-08 NOTE — Care Management Note (Signed)
Case Management Note  Patient Details  Name: Keithen Capo MRN: [id number] Date of Birth: [birth year]                Action/Plan: Plan to return home at discharge. Patient refused [facility] services and SNF.  Expected Discharge Date:  09/01/14               Expected Discharge Plan:  Home/Self Care  In-House Referral:  Clinical Social Work  Discharge planning Services  CM Consult  HH Arranged:  Patient Refused  Status of Service:  In process, will continue to follow  Medicare Important Message Given:  Yes Date Medicare IM Given:  09/04/14 Medicare IM give by:  Mindi Slicker RN Date Additional Medicare IM Given:  09/07/14 Additional Medicare Important Message give by:  Mindi Slicker RN  If discussed at Long Length of Stay Meetings, dates discussed:  09/08/2014  Royston Bake, RN 09/08/2014, 7:57 AM

## 2014-09-08 NOTE — Progress Notes (Signed)
Orders received for pt discharge.  Discharge summary printed and reviewed with pt.  Explained medication regimen, and pt had no further questions at this time.  IV removed and site remains clean, dry, intact.  Telemetry removed.  Pt in stable condition and awaiting transport. 

## 2014-09-08 NOTE — Progress Notes (Signed)
Per MD- patient is stable for d/c today to home.  CSW spoke with patient as he was once again requesting a taxi ride home.  CSW reminded patient that per our discussion last week- he was told that he needed to make transportation arrangements.  Patient stated that he did not have anyone to come and get him.  CSW spoke with patient's daughter [name]- she stated that she has just spoken to patient and will becoming to pick him up in about 30 minutes.  Nursing is aware.  No further CSW needs identified and will sign off.  Lorie Phenix. Pauline Good, [place]

## 2014-09-09 ENCOUNTER — Encounter: Payer: Self-pay | Admitting: Family Medicine

## 2014-09-14 ENCOUNTER — Encounter: Payer: Self-pay | Admitting: Licensed Clinical Social Worker

## 2014-09-14 ENCOUNTER — Telehealth: Payer: Self-pay | Admitting: Vascular Surgery

## 2014-09-14 ENCOUNTER — Ambulatory Visit (HOSPITAL_BASED_OUTPATIENT_CLINIC_OR_DEPARTMENT_OTHER)
Admit: 2014-09-14 | Discharge: 2014-09-14 | Disposition: A | Discharge status: Home / Self Care | Payer: Medicare Other | Source: Ambulatory Visit | Attending: Internal Medicine | Admitting: Internal Medicine

## 2014-09-14 ENCOUNTER — Encounter (HOSPITAL_COMMUNITY): Payer: Medicare Other

## 2014-09-14 VITALS — BP 162/90 | HR 92 | Wt 183.1 lb

## 2014-09-14 DIAGNOSIS — I2510 Atherosclerotic heart disease of native coronary artery without angina pectoris: Secondary | ICD-10-CM | POA: Insufficient documentation

## 2014-09-14 DIAGNOSIS — F1410 Cocaine abuse, uncomplicated: Secondary | ICD-10-CM | POA: Diagnosis present

## 2014-09-14 DIAGNOSIS — I130 Hypertensive heart and chronic kidney disease with heart failure and stage 1 through stage 4 chronic kidney disease, or unspecified chronic kidney disease: Secondary | ICD-10-CM | POA: Diagnosis not present

## 2014-09-14 DIAGNOSIS — R0602 Shortness of breath: Secondary | ICD-10-CM | POA: Diagnosis not present

## 2014-09-14 DIAGNOSIS — Z590 Homelessness: Secondary | ICD-10-CM

## 2014-09-14 DIAGNOSIS — D649 Anemia, unspecified: Secondary | ICD-10-CM | POA: Insufficient documentation

## 2014-09-14 DIAGNOSIS — I10 Essential (primary) hypertension: Secondary | ICD-10-CM | POA: Diagnosis not present

## 2014-09-14 DIAGNOSIS — Z888 Allergy status to other drugs, medicaments and biological substances status: Secondary | ICD-10-CM

## 2014-09-14 DIAGNOSIS — Z951 Presence of aortocoronary bypass graft: Secondary | ICD-10-CM

## 2014-09-14 DIAGNOSIS — I5022 Chronic systolic (congestive) heart failure: Secondary | ICD-10-CM

## 2014-09-14 DIAGNOSIS — I5032 Chronic diastolic (congestive) heart failure: Secondary | ICD-10-CM | POA: Diagnosis not present

## 2014-09-14 DIAGNOSIS — E875 Hyperkalemia: Secondary | ICD-10-CM | POA: Diagnosis present

## 2014-09-14 DIAGNOSIS — F319 Bipolar disorder, unspecified: Secondary | ICD-10-CM | POA: Diagnosis present

## 2014-09-14 DIAGNOSIS — I272 Other secondary pulmonary hypertension: Secondary | ICD-10-CM | POA: Diagnosis present

## 2014-09-14 DIAGNOSIS — E1122 Type 2 diabetes mellitus with diabetic chronic kidney disease: Secondary | ICD-10-CM | POA: Diagnosis present

## 2014-09-14 DIAGNOSIS — M1990 Unspecified osteoarthritis, unspecified site: Secondary | ICD-10-CM | POA: Diagnosis present

## 2014-09-14 DIAGNOSIS — Z7151 Drug abuse counseling and surveillance of drug abuser: Secondary | ICD-10-CM | POA: Diagnosis present

## 2014-09-14 DIAGNOSIS — D509 Iron deficiency anemia, unspecified: Secondary | ICD-10-CM | POA: Diagnosis present

## 2014-09-14 DIAGNOSIS — G8929 Other chronic pain: Secondary | ICD-10-CM | POA: Diagnosis present

## 2014-09-14 DIAGNOSIS — Z7982 Long term (current) use of aspirin: Secondary | ICD-10-CM

## 2014-09-14 DIAGNOSIS — G40909 Epilepsy, unspecified, not intractable, without status epilepticus: Secondary | ICD-10-CM | POA: Diagnosis present

## 2014-09-14 DIAGNOSIS — I739 Peripheral vascular disease, unspecified: Secondary | ICD-10-CM | POA: Diagnosis present

## 2014-09-14 DIAGNOSIS — E785 Hyperlipidemia, unspecified: Secondary | ICD-10-CM | POA: Diagnosis present

## 2014-09-14 DIAGNOSIS — F209 Schizophrenia, unspecified: Secondary | ICD-10-CM | POA: Diagnosis present

## 2014-09-14 DIAGNOSIS — Z9119 Patient's noncompliance with other medical treatment and regimen: Secondary | ICD-10-CM | POA: Diagnosis present

## 2014-09-14 DIAGNOSIS — F17210 Nicotine dependence, cigarettes, uncomplicated: Secondary | ICD-10-CM | POA: Diagnosis present

## 2014-09-14 DIAGNOSIS — G4733 Obstructive sleep apnea (adult) (pediatric): Secondary | ICD-10-CM | POA: Diagnosis present

## 2014-09-14 DIAGNOSIS — K7460 Unspecified cirrhosis of liver: Secondary | ICD-10-CM | POA: Diagnosis present

## 2014-09-14 DIAGNOSIS — J449 Chronic obstructive pulmonary disease, unspecified: Secondary | ICD-10-CM | POA: Diagnosis present

## 2014-09-14 DIAGNOSIS — I252 Old myocardial infarction: Secondary | ICD-10-CM

## 2014-09-14 DIAGNOSIS — N183 Chronic kidney disease, stage 3 (moderate): Secondary | ICD-10-CM | POA: Diagnosis present

## 2014-09-14 DIAGNOSIS — R001 Bradycardia, unspecified: Secondary | ICD-10-CM | POA: Diagnosis present

## 2014-09-14 DIAGNOSIS — I5043 Acute on chronic combined systolic (congestive) and diastolic (congestive) heart failure: Secondary | ICD-10-CM | POA: Diagnosis present

## 2014-09-14 DIAGNOSIS — D631 Anemia in chronic kidney disease: Secondary | ICD-10-CM | POA: Diagnosis present

## 2014-09-14 LAB — BRAIN NATRIURETIC PEPTIDE
B Natriuretic Peptide: 3315 pg/mL — ABNORMAL HIGH (ref 0.0–100.0)
B Natriuretic Peptide: 3617.4 pg/mL — ABNORMAL HIGH (ref 0.0–100.0)

## 2014-09-14 MED ORDER — FUROSEMIDE 80 MG PO TABS: 80.0000 mg | ORAL_TABLET | Freq: Two times a day (BID) | ORAL | Status: DC

## 2014-09-14 MED ORDER — POTASSIUM CHLORIDE CRYS ER 20 MEQ PO TBCR
20.0000 meq | EXTENDED_RELEASE_TABLET | Freq: Once | ORAL | Status: AC
  Administered 2014-09-14: 20 meq via ORAL

## 2014-09-14 MED ORDER — CLONIDINE HCL 0.3 MG PO TABS: 0.3000 mg | ORAL_TABLET | Freq: Two times a day (BID) | ORAL | Status: DC

## 2014-09-14 MED ORDER — METOLAZONE 5 MG PO TABS: 5.0000 mg | ORAL_TABLET | ORAL | Status: DC | PRN

## 2014-09-14 MED ORDER — FUROSEMIDE 10 MG/ML IJ SOLN
80.0000 mg | Freq: Once | INTRAMUSCULAR | Status: AC
  Administered 2014-09-14: 80 mg via INTRAVENOUS

## 2014-09-14 NOTE — Progress Notes (Signed)
Patient ID: Charles [name], male   DOB: Sep 01, 1945, 69 y.o.   MRN: [id number]   Primary Physician: [name]  Primary Cardiologist: Dr. Marylou Mccoy [facility])   HPI: Charles [name] is a 69 y.o. year old male with prior h/o cocaine abuse, GI bleed, diastolic CHF (EF 67-67% 2/09), CAD s/p CABG 2009, and stage 3 CKD, Myoview in December 2014 no ischemia and PAD. and frequent hospitalizations due to acute HF.   He has had frequent hospitalizations for decompensated HF. Now living with daughter in [facility] and says that there is a lot of drug use there. He would like to get out if he can. Just admitted last week. Diuresed. D/c weight was 174. Also had ABIs which showed severe PAD. Underwent arteriogram with Dr. Scot Dock which showed multi-level PAD. He has f/u with them soon. Says he feels ok. But weight up 9 pounds. Says he is taking all medicines. Weighs himself every day. Not taking extra lasix.   ECHO EF 07/16/13 50-55% Grade 2 DD ECHO EF 3/16  EF 45-50% Grade 2 DD  Labs 08/02/13 +cocaine  Labs 08/19/13 K 5.2 Creatinine 1.84   SH: Lives with daughter in [place], drinks 2 40 oz cans beer a week, uses cocaine and smokes 2 cigs a day . Stopped using drugs  FH: No cardiac history    ROS: All systems negative except as listed in HPI, PMH and Problem List.  Past Medical History  Diagnosis Date  . Iron deficiency anemia   . Chronic combined systolic and diastolic CHF, NYHA class 2     a. 11/2013 Echo: EF 40-45%, basl-mid inflat AK, Gr 2 DD.  [name] Hyperlipidemia LDL goal <70   . Obstructive sleep apnea     a. not on home CPAP  . Ischemic cardiomyopathy     a. 11/2013 Echo: EF 40-45%, basl-mid inflat AK, Gr 2 DD, mild AI, mod dil LA/RA, mod reduced RV fxn, PASP 78mmHg.  [name] Homelessness   . Moderate to severe pulmonary hypertension 03/2010    a. PA peak pressure of 76 mmHg (per 2D Echo 03/2010)  . Polysubstance abuse     a. cocaine, THC  . AV malformation of gastrointestinal tract     a. w/  h/o GIB.  [name] Family history of early CAD   . DDD (degenerative disc disease), lumbar   . Peripheral vascular disease   . Seizure disorder   . Hypertension   . CAD (coronary artery disease)     a. s/p 3-vessel CABG (12/2007) // 100% RCA stenosis with collaterals from left system. Severe bifurcation lesions of proxima CXA and OM. Moderate LAD disease - followed by Dr. Wyline Copas in [facility];  b. 11/2013 Myoview: Large fixed inf defect w/o ischemia, EF 35%.  . Diabetes   . Chronic kidney disease (CKD), stage III (moderate)     a. BL SCr 1.5-1.6  . Renal artery stenosis   . Seizures     Current Outpatient Prescriptions  Medication Sig Dispense Refill  . albuterol (PROVENTIL HFA;VENTOLIN HFA) 108 (90 BASE) MCG/ACT inhaler Inhale 2 puffs into the lungs every 6 (six) hours as needed for wheezing or shortness of breath. 1 Inhaler 2  . albuterol (PROVENTIL HFA;VENTOLIN HFA) 108 (90 BASE) MCG/ACT inhaler Inhale 2 puffs into the lungs every 6 (six) hours as needed for wheezing or shortness of breath. 1 Inhaler 2  . aspirin EC 325 MG tablet Take 1 tablet (325 mg total) by mouth daily. 30 tablet 3  .  budesonide-formoterol (SYMBICORT) 80-4.5 MCG/ACT inhaler Inhale 2 puffs into the lungs 2 (two) times daily. 1 Inhaler 12  . citalopram (CELEXA) 20 MG tablet Take 20 mg by mouth daily.    . cloNIDine (CATAPRES) 0.2 MG tablet Take 1 tablet (0.2 mg total) by mouth 2 (two) times daily. 30 tablet 0  . cyclobenzaprine (FLEXERIL) 5 MG tablet Take 1 tablet (5 mg total) by mouth 3 (three) times daily as needed for muscle spasms (chest and leg pains). 30 tablet 0  . divalproex (DEPAKOTE ER) 500 MG 24 hr tablet Take 500 mg by mouth 2 (two) times daily.    . ferrous sulfate 325 (65 FE) MG tablet Take 325 mg by mouth 3 (three) times daily with meals.     . furosemide (LASIX) 40 MG tablet Take 1 tablet (40 mg total) by mouth 2 (two) times daily. 60 tablet 3  . hydrALAZINE (APRESOLINE) 100 MG tablet Take 100 mg by mouth 3  (three) times daily.    [name] HYDROcodone-acetaminophen (NORCO/VICODIN) 5-325 MG per tablet Take 2 tablets by mouth every 6 (six) hours as needed for moderate pain. 30 tablet 0  . isosorbide mononitrate (IMDUR) 30 MG 24 hr tablet Take 2 tablets (60 mg total) by mouth daily. 30 tablet 0  . lisinopril (PRINIVIL,ZESTRIL) 20 MG tablet Take 0.5 tablets (10 mg total) by mouth 2 (two) times daily.    . nitroGLYCERIN (NITROSTAT) 0.4 MG SL tablet Place 1 tablet (0.4 mg total) under the tongue every 5 (five) minutes as needed for chest pain (upto max 3 doses at one time.). 30 tablet 0  . pantoprazole (PROTONIX) 40 MG tablet Take 40 mg by mouth daily.     . potassium chloride SA (KLOR-CON M20) 20 MEQ tablet Take 20 mEq by mouth daily.    . predniSONE (DELTASONE) 10 MG tablet Prednisone dosing: Take  Prednisone 30mg  (3 tabs) x 2 days, then 20mg  (2 tabs) x 3days, then 10mg  (1 tab) x 3days, then OFF. 15 tablet 0   No current facility-administered medications for this encounter.     PHYSICAL EXAM: Filed Vitals:   09/14/14 1015  BP: 162/90  Pulse: 92  Weight: 183 lb 1.9 oz (83.063 kg)  SpO2: 96%    General:  Well appearing. No resp difficulty HEENT: normal Neck: supple. JVP to ear. Carotids 2+ bilaterally; no bruits. No lymphadenopathy or thryomegaly appreciated. Cor: PMI normal. Regular rate & rhythm. No rubs, gallops or murmurs. Lungs: clear Abdomen: soft, nontender, ++distended. No hepatosplenomegaly. No bruits or masses. Good bowel sounds. Extremities: no cyanosis, clubbing, rash, 3+ tight edema Neuro: alert & orientedx3, cranial nerves grossly intact. Moves all 4 extremities w/o difficulty. Affect pleasant.   ASSESSMENT & PLAN:  1. Chronic Diastolic Heart Failure ICM EF 50% Grade II DD.  Volume status markedly elevated. Weight up 9 pounds from discharge. Will give IV lasix today. Increase lasix to 80 mg bid. Would like his weight < 175 pounds.  Will use metolazone 5mg  for weight 179 or greater.   I reinforced daily weighs and provided a weight chart, low salt food choices, and medication compliance.  2. Cocaine Abuse- trying to quit 3. HTN-Elevated.  Increase clonidine 0.3 bid.  4. Anemia- H/O AVMS  5. Hyperkalemia - Will recehck today 6. CAD  CABG x s3 2009 on 81 mg aspirin daily, and simvastatin 40 mg daily.  7. Living situation - will follow up with SW 8. PAD - schedule f/u with [facility].    Glori Bickers MD  10:44 AM

## 2014-09-14 NOTE — Telephone Encounter (Signed)
Voicemail is full- mailed appt letter, dpm

## 2014-09-14 NOTE — Addendum Note (Signed)
Encounter addended by: Effie Berkshire, RN on: 09/14/2014 11:32 AM<BR>     Documentation filed: Visit Diagnoses, Dx Association, Orders, Inpatient [facility]

## 2014-09-14 NOTE — Addendum Note (Signed)
Encounter addended by: Effie Berkshire, RN on: 09/14/2014 12:19 PM<BR>     Documentation filed: Notes Section

## 2014-09-14 NOTE — Addendum Note (Signed)
Encounter addended by: Scarlette Calico, RN on: 09/14/2014 11:15 AM<BR>     Documentation filed: Dx Association, Orders

## 2014-09-14 NOTE — Telephone Encounter (Signed)
-----   Message from Mena Goes, RN sent at 09/09/2014  9:51 AM EDT ----- Regarding: Schedule   ----- Message -----    From: Angelia Mould, MD    Sent: 09/08/2014   7:01 PM      To: [facility] Charge Pool Subject: f/u                                            This patient was discharged. He will need a follow up visit in 3-4 weeks if I did not call this in already. Thank you. CD

## 2014-09-14 NOTE — Addendum Note (Signed)
Encounter addended by: Scarlette Calico, RN on: 09/14/2014 12:44 PM<BR>     Documentation filed: Orders, Patient Instructions Section

## 2014-09-14 NOTE — Addendum Note (Signed)
Encounter addended by: Effie Berkshire, RN on: 09/14/2014 11:46 AM<BR>     Documentation filed: Dx Association, Orders

## 2014-09-14 NOTE — Addendum Note (Signed)
Encounter addended by: Effie Berkshire, RN on: 09/14/2014 12:07 PM<BR>     Documentation filed: Notes Section

## 2014-09-14 NOTE — Patient Instructions (Addendum)
Increase Furosemide (Lasix) to 80 mg Twice daily, we have sent you in a prescription for 80 mg tablets  Increase Clonidine to 0.3 mg Twice daily   Take Metolazone 5 mg for the next 3 days then, ANYTIME your weight is 179 lb or greater  Your physician recommends that you schedule a follow-up appointment in: 1 week

## 2014-09-14 NOTE — Progress Notes (Addendum)
Patient seen in CHF clinic today, volume overloaded.  Per Dr. Haroldine Laws, PIV 22 g started x 1 attempt in [facility], patient tolerated well. Administered 80 mg IV lasix and 20 meq PO potassium. IV line saline locked.  Patient in clinic room with call bell and urinal in reach. Will continue to monitor patient closely.  Total urinary output: 350 cc dark amber urine  Renee Pain

## 2014-09-14 NOTE — Addendum Note (Signed)
Encounter addended by: Effie Berkshire, RN on: 09/14/2014 11:27 AM<BR>     Documentation filed: Notes Section

## 2014-09-14 NOTE — Progress Notes (Signed)
CSW referred to assist with housing. Patient currently lives with another woman although states he would like to make a move to his own place. CSW discussed various options and provided patient with list of available apartments in his price range. There are long waiting lists for most senior/affordable housing units and patient is aware. CSW will be available if further needs arise. Raquel Sarna, [place]

## 2014-09-15 ENCOUNTER — Encounter (HOSPITAL_BASED_OUTPATIENT_CLINIC_OR_DEPARTMENT_OTHER): Payer: Self-pay | Admitting: *Deleted

## 2014-09-15 ENCOUNTER — Emergency Department (HOSPITAL_BASED_OUTPATIENT_CLINIC_OR_DEPARTMENT_OTHER): Payer: Medicare Other

## 2014-09-15 ENCOUNTER — Inpatient Hospital Stay (HOSPITAL_COMMUNITY): Payer: Medicare Other

## 2014-09-15 ENCOUNTER — Inpatient Hospital Stay (HOSPITAL_BASED_OUTPATIENT_CLINIC_OR_DEPARTMENT_OTHER)
Admission: EM | Admit: 2014-09-15 | Discharge: 2014-09-18 | DRG: 291 | Disposition: A | Discharge status: Home / Self Care | Payer: Medicare Other | Attending: Internal Medicine | Admitting: Internal Medicine

## 2014-09-15 DIAGNOSIS — I509 Heart failure, unspecified: Secondary | ICD-10-CM

## 2014-09-15 DIAGNOSIS — F319 Bipolar disorder, unspecified: Secondary | ICD-10-CM | POA: Diagnosis present

## 2014-09-15 DIAGNOSIS — E785 Hyperlipidemia, unspecified: Secondary | ICD-10-CM | POA: Diagnosis present

## 2014-09-15 DIAGNOSIS — I2510 Atherosclerotic heart disease of native coronary artery without angina pectoris: Secondary | ICD-10-CM | POA: Diagnosis present

## 2014-09-15 DIAGNOSIS — I252 Old myocardial infarction: Secondary | ICD-10-CM | POA: Diagnosis not present

## 2014-09-15 DIAGNOSIS — I10 Essential (primary) hypertension: Secondary | ICD-10-CM | POA: Diagnosis not present

## 2014-09-15 DIAGNOSIS — D509 Iron deficiency anemia, unspecified: Secondary | ICD-10-CM | POA: Diagnosis present

## 2014-09-15 DIAGNOSIS — Z888 Allergy status to other drugs, medicaments and biological substances status: Secondary | ICD-10-CM | POA: Diagnosis not present

## 2014-09-15 DIAGNOSIS — R001 Bradycardia, unspecified: Secondary | ICD-10-CM | POA: Diagnosis present

## 2014-09-15 DIAGNOSIS — I5043 Acute on chronic combined systolic (congestive) and diastolic (congestive) heart failure: Secondary | ICD-10-CM | POA: Diagnosis present

## 2014-09-15 DIAGNOSIS — N189 Chronic kidney disease, unspecified: Secondary | ICD-10-CM

## 2014-09-15 DIAGNOSIS — M79606 Pain in leg, unspecified: Secondary | ICD-10-CM

## 2014-09-15 DIAGNOSIS — J449 Chronic obstructive pulmonary disease, unspecified: Secondary | ICD-10-CM | POA: Diagnosis present

## 2014-09-15 DIAGNOSIS — R188 Other ascites: Secondary | ICD-10-CM

## 2014-09-15 DIAGNOSIS — M1990 Unspecified osteoarthritis, unspecified site: Secondary | ICD-10-CM | POA: Diagnosis present

## 2014-09-15 DIAGNOSIS — K7460 Unspecified cirrhosis of liver: Secondary | ICD-10-CM | POA: Diagnosis present

## 2014-09-15 DIAGNOSIS — Z9119 Patient's noncompliance with other medical treatment and regimen: Secondary | ICD-10-CM | POA: Diagnosis present

## 2014-09-15 DIAGNOSIS — R1084 Generalized abdominal pain: Secondary | ICD-10-CM | POA: Diagnosis not present

## 2014-09-15 DIAGNOSIS — Z951 Presence of aortocoronary bypass graft: Secondary | ICD-10-CM | POA: Diagnosis not present

## 2014-09-15 DIAGNOSIS — M79604 Pain in right leg: Secondary | ICD-10-CM | POA: Diagnosis present

## 2014-09-15 DIAGNOSIS — F17210 Nicotine dependence, cigarettes, uncomplicated: Secondary | ICD-10-CM | POA: Diagnosis present

## 2014-09-15 DIAGNOSIS — K5900 Constipation, unspecified: Secondary | ICD-10-CM

## 2014-09-15 DIAGNOSIS — G8929 Other chronic pain: Secondary | ICD-10-CM

## 2014-09-15 DIAGNOSIS — D631 Anemia in chronic kidney disease: Secondary | ICD-10-CM | POA: Diagnosis present

## 2014-09-15 DIAGNOSIS — E875 Hyperkalemia: Secondary | ICD-10-CM | POA: Diagnosis present

## 2014-09-15 DIAGNOSIS — I130 Hypertensive heart and chronic kidney disease with heart failure and stage 1 through stage 4 chronic kidney disease, or unspecified chronic kidney disease: Secondary | ICD-10-CM | POA: Diagnosis present

## 2014-09-15 DIAGNOSIS — G40909 Epilepsy, unspecified, not intractable, without status epilepticus: Secondary | ICD-10-CM | POA: Diagnosis present

## 2014-09-15 DIAGNOSIS — I272 Other secondary pulmonary hypertension: Secondary | ICD-10-CM | POA: Diagnosis present

## 2014-09-15 DIAGNOSIS — I739 Peripheral vascular disease, unspecified: Secondary | ICD-10-CM | POA: Diagnosis present

## 2014-09-15 DIAGNOSIS — M79605 Pain in left leg: Secondary | ICD-10-CM | POA: Diagnosis present

## 2014-09-15 DIAGNOSIS — I5033 Acute on chronic diastolic (congestive) heart failure: Secondary | ICD-10-CM | POA: Insufficient documentation

## 2014-09-15 DIAGNOSIS — F1410 Cocaine abuse, uncomplicated: Secondary | ICD-10-CM | POA: Diagnosis present

## 2014-09-15 DIAGNOSIS — Z7982 Long term (current) use of aspirin: Secondary | ICD-10-CM | POA: Diagnosis not present

## 2014-09-15 DIAGNOSIS — R778 Other specified abnormalities of plasma proteins: Secondary | ICD-10-CM

## 2014-09-15 DIAGNOSIS — F209 Schizophrenia, unspecified: Secondary | ICD-10-CM | POA: Diagnosis present

## 2014-09-15 DIAGNOSIS — Z7151 Drug abuse counseling and surveillance of drug abuser: Secondary | ICD-10-CM | POA: Diagnosis present

## 2014-09-15 DIAGNOSIS — G4733 Obstructive sleep apnea (adult) (pediatric): Secondary | ICD-10-CM | POA: Diagnosis present

## 2014-09-15 DIAGNOSIS — R569 Unspecified convulsions: Secondary | ICD-10-CM

## 2014-09-15 DIAGNOSIS — E1122 Type 2 diabetes mellitus with diabetic chronic kidney disease: Secondary | ICD-10-CM | POA: Diagnosis present

## 2014-09-15 DIAGNOSIS — N289 Disorder of kidney and ureter, unspecified: Secondary | ICD-10-CM

## 2014-09-15 DIAGNOSIS — R7989 Other specified abnormal findings of blood chemistry: Secondary | ICD-10-CM

## 2014-09-15 DIAGNOSIS — Z590 Homelessness: Secondary | ICD-10-CM | POA: Diagnosis not present

## 2014-09-15 DIAGNOSIS — N183 Chronic kidney disease, stage 3 (moderate): Secondary | ICD-10-CM | POA: Diagnosis present

## 2014-09-15 DIAGNOSIS — R0602 Shortness of breath: Secondary | ICD-10-CM | POA: Diagnosis present

## 2014-09-15 HISTORY — DX: Personal history of other diseases of the digestive system: Z87.19

## 2014-09-15 HISTORY — DX: Angina pectoris, unspecified: I20.9

## 2014-09-15 HISTORY — DX: Malignant (primary) neoplasm, unspecified: C80.1

## 2014-09-15 HISTORY — DX: Low back pain, unspecified: M54.50

## 2014-09-15 HISTORY — DX: Depression, unspecified: F32.A

## 2014-09-15 HISTORY — DX: Personal history of other medical treatment: Z92.89

## 2014-09-15 HISTORY — DX: Pneumonia, unspecified organism: J18.9

## 2014-09-15 HISTORY — DX: Other chronic pain: G89.29

## 2014-09-15 HISTORY — DX: Bipolar disorder, unspecified: F31.9

## 2014-09-15 HISTORY — DX: Chronic obstructive pulmonary disease, unspecified: J44.9

## 2014-09-15 HISTORY — DX: Low back pain: M54.5

## 2014-09-15 HISTORY — DX: Schizophrenia, unspecified: F20.9

## 2014-09-15 HISTORY — DX: Major depressive disorder, single episode, unspecified: F32.9

## 2014-09-15 HISTORY — DX: Unspecified osteoarthritis, unspecified site: M19.90

## 2014-09-15 LAB — CBC WITH DIFFERENTIAL/PLATELET
BASOS ABS: 0.2 10*3/uL — AB (ref 0.0–0.1)
Basophils Relative: 2 % — ABNORMAL HIGH (ref 0–1)
Eosinophils Absolute: 0.1 10*3/uL (ref 0.0–0.7)
Eosinophils Relative: 1 % (ref 0–5)
HEMATOCRIT: 25.8 % — AB (ref 39.0–52.0)
Hemoglobin: 8 g/dL — ABNORMAL LOW (ref 13.0–17.0)
LYMPHS ABS: 0.5 10*3/uL — AB (ref 0.7–4.0)
LYMPHS PCT: 5 % — AB (ref 12–46)
MCH: 27.9 pg (ref 26.0–34.0)
MCHC: 31 g/dL (ref 30.0–36.0)
MCV: 89.9 fL (ref 78.0–100.0)
MONO ABS: 0.7 10*3/uL (ref 0.1–1.0)
MONOS PCT: 8 % (ref 3–12)
MYELOCYTES: 2 %
NEUTROS PCT: 82 % — AB (ref 43–77)
Neutro Abs: 7.8 10*3/uL — ABNORMAL HIGH (ref 1.7–7.7)
Platelets: 265 10*3/uL (ref 150–400)
RBC: 2.87 MIL/uL — ABNORMAL LOW (ref 4.22–5.81)
RDW: 24.7 % — AB (ref 11.5–15.5)
WBC: 9.3 10*3/uL (ref 4.0–10.5)
nRBC: 7 /100 WBC — ABNORMAL HIGH

## 2014-09-15 LAB — BASIC METABOLIC PANEL
ANION GAP: 10 (ref 5–15)
BUN: 29 mg/dL — AB (ref 6–20)
CALCIUM: 8.5 mg/dL — AB (ref 8.9–10.3)
CO2: 24 mmol/L (ref 22–32)
CREATININE: 1.53 mg/dL — AB (ref 0.61–1.24)
Chloride: 104 mmol/L (ref 101–111)
GFR calc Af Amer: 51 mL/min — ABNORMAL LOW (ref >60)
GFR calc non Af Amer: 44 mL/min — ABNORMAL LOW (ref >60)
GLUCOSE: 92 mg/dL (ref 65–99)
Potassium: 4.4 mmol/L (ref 3.5–5.1)
Sodium: 138 mmol/L (ref 135–145)

## 2014-09-15 LAB — LIPASE, BLOOD: Lipase: 28 U/L (ref 22–51)

## 2014-09-15 LAB — HEPATIC FUNCTION PANEL
ALK PHOS: 70 U/L (ref 38–126)
ALT: 34 U/L (ref 17–63)
AST: 50 U/L — ABNORMAL HIGH (ref 15–41)
Albumin: 2.9 g/dL — ABNORMAL LOW (ref 3.5–5.0)
BILIRUBIN INDIRECT: 0.6 mg/dL (ref 0.3–0.9)
Bilirubin, Direct: 0.6 mg/dL — ABNORMAL HIGH (ref 0.1–0.5)
Total Bilirubin: 1.2 mg/dL (ref 0.3–1.2)
Total Protein: 6.1 g/dL — ABNORMAL LOW (ref 6.5–8.1)

## 2014-09-15 LAB — TROPONIN I
TROPONIN I: 0.05 ng/mL — AB (ref <0.031)
Troponin I: 0.04 ng/mL — ABNORMAL HIGH (ref <0.031)

## 2014-09-15 LAB — VALPROIC ACID LEVEL: Valproic Acid Lvl: 34 ug/mL — ABNORMAL LOW (ref 50.0–100.0)

## 2014-09-15 LAB — BRAIN NATRIURETIC PEPTIDE: B Natriuretic Peptide: 3756.9 pg/mL — ABNORMAL HIGH (ref 0.0–100.0)

## 2014-09-15 MED ORDER — SODIUM CHLORIDE 0.9 % IJ SOLN
3.0000 mL | Freq: Two times a day (BID) | INTRAMUSCULAR | Status: DC
  Administered 2014-09-15 – 2014-09-18 (×6): 3 mL via INTRAVENOUS

## 2014-09-15 MED ORDER — FUROSEMIDE 10 MG/ML IJ SOLN
40.0000 mg | Freq: Once | INTRAMUSCULAR | Status: AC
  Administered 2014-09-15: 40 mg via INTRAVENOUS
  Filled 2014-09-15: qty 4

## 2014-09-15 MED ORDER — ALBUTEROL SULFATE HFA 108 (90 BASE) MCG/ACT IN AERS: 2.0000 | INHALATION_SPRAY | Freq: Four times a day (QID) | RESPIRATORY_TRACT | Status: DC | PRN

## 2014-09-15 MED ORDER — DIVALPROEX SODIUM ER 500 MG PO TB24
500.0000 mg | ORAL_TABLET | Freq: Two times a day (BID) | ORAL | Status: DC
  Administered 2014-09-15 – 2014-09-18 (×6): 500 mg via ORAL
  Filled 2014-09-15 (×7): qty 1

## 2014-09-15 MED ORDER — ALBUTEROL SULFATE (2.5 MG/3ML) 0.083% IN NEBU: 2.5000 mg | INHALATION_SOLUTION | Freq: Four times a day (QID) | RESPIRATORY_TRACT | Status: DC | PRN

## 2014-09-15 MED ORDER — CLONIDINE HCL 0.3 MG PO TABS
0.3000 mg | ORAL_TABLET | Freq: Two times a day (BID) | ORAL | Status: DC
  Administered 2014-09-16 – 2014-09-18 (×4): 0.3 mg via ORAL
  Filled 2014-09-15 (×6): qty 1

## 2014-09-15 MED ORDER — CITALOPRAM HYDROBROMIDE 20 MG PO TABS
20.0000 mg | ORAL_TABLET | Freq: Every day | ORAL | Status: DC
  Administered 2014-09-16 – 2014-09-18 (×3): 20 mg via ORAL
  Filled 2014-09-15 (×3): qty 1

## 2014-09-15 MED ORDER — ONDANSETRON HCL 4 MG/2ML IJ SOLN: 4.0000 mg | Freq: Four times a day (QID) | INTRAMUSCULAR | Status: DC | PRN

## 2014-09-15 MED ORDER — SODIUM CHLORIDE 0.9 % IJ SOLN: 3.0000 mL | INTRAMUSCULAR | Status: DC | PRN

## 2014-09-15 MED ORDER — FOLIC ACID 1 MG PO TABS
1.0000 mg | ORAL_TABLET | Freq: Every day | ORAL | Status: DC
  Administered 2014-09-15 – 2014-09-18 (×4): 1 mg via ORAL
  Filled 2014-09-15 (×4): qty 1

## 2014-09-15 MED ORDER — CYCLOBENZAPRINE HCL 10 MG PO TABS
5.0000 mg | ORAL_TABLET | Freq: Three times a day (TID) | ORAL | Status: DC | PRN
  Administered 2014-09-16: 5 mg via ORAL
  Filled 2014-09-15: qty 1

## 2014-09-15 MED ORDER — PANTOPRAZOLE SODIUM 40 MG PO TBEC
40.0000 mg | DELAYED_RELEASE_TABLET | Freq: Every day | ORAL | Status: DC
  Administered 2014-09-16 – 2014-09-18 (×3): 40 mg via ORAL
  Filled 2014-09-15 (×3): qty 1

## 2014-09-15 MED ORDER — HYDROCODONE-ACETAMINOPHEN 5-325 MG PO TABS
2.0000 | ORAL_TABLET | Freq: Four times a day (QID) | ORAL | Status: DC | PRN
  Administered 2014-09-15: 2 via ORAL
  Filled 2014-09-15: qty 2

## 2014-09-15 MED ORDER — HEPARIN SODIUM (PORCINE) 5000 UNIT/ML IJ SOLN
5000.0000 [IU] | Freq: Three times a day (TID) | INTRAMUSCULAR | Status: DC
  Administered 2014-09-15 – 2014-09-17 (×5): 5000 [IU] via SUBCUTANEOUS
  Filled 2014-09-15 (×7): qty 1

## 2014-09-15 MED ORDER — FUROSEMIDE 10 MG/ML IJ SOLN
40.0000 mg | Freq: Two times a day (BID) | INTRAMUSCULAR | Status: DC
  Administered 2014-09-15 – 2014-09-16 (×2): 40 mg via INTRAVENOUS
  Filled 2014-09-15 (×4): qty 4

## 2014-09-15 MED ORDER — ASPIRIN EC 325 MG PO TBEC
325.0000 mg | DELAYED_RELEASE_TABLET | Freq: Every day | ORAL | Status: DC
  Administered 2014-09-16: 325 mg via ORAL
  Filled 2014-09-15 (×2): qty 1

## 2014-09-15 MED ORDER — POLYETHYLENE GLYCOL 3350 17 G PO PACK
17.0000 g | PACK | Freq: Every day | ORAL | Status: DC
  Filled 2014-09-15 (×2): qty 1

## 2014-09-15 MED ORDER — ISOSORBIDE MONONITRATE ER 60 MG PO TB24
60.0000 mg | ORAL_TABLET | Freq: Every day | ORAL | Status: DC
  Administered 2014-09-16 – 2014-09-18 (×3): 60 mg via ORAL
  Filled 2014-09-15 (×3): qty 1

## 2014-09-15 MED ORDER — VITAMIN B-12 1000 MCG PO TABS
1000.0000 ug | ORAL_TABLET | Freq: Every day | ORAL | Status: DC
  Administered 2014-09-15 – 2014-09-18 (×4): 1000 ug via ORAL
  Filled 2014-09-15 (×4): qty 1

## 2014-09-15 MED ORDER — HYDRALAZINE HCL 100 MG PO TABS: 100.0000 mg | ORAL_TABLET | Freq: Three times a day (TID) | ORAL | Status: DC

## 2014-09-15 MED ORDER — BUDESONIDE-FORMOTEROL FUMARATE 80-4.5 MCG/ACT IN AERO
2.0000 | INHALATION_SPRAY | Freq: Two times a day (BID) | RESPIRATORY_TRACT | Status: DC
  Administered 2014-09-16 – 2014-09-18 (×5): 2 via RESPIRATORY_TRACT
  Filled 2014-09-15: qty 6.9

## 2014-09-15 MED ORDER — ACETAMINOPHEN 325 MG PO TABS: 650.0000 mg | ORAL_TABLET | ORAL | Status: DC | PRN

## 2014-09-15 MED ORDER — SODIUM CHLORIDE 0.9 % IV SOLN: 250.0000 mL | INTRAVENOUS | Status: DC | PRN

## 2014-09-15 MED ORDER — HYDRALAZINE HCL 50 MG PO TABS
100.0000 mg | ORAL_TABLET | Freq: Three times a day (TID) | ORAL | Status: DC
  Administered 2014-09-15 – 2014-09-18 (×8): 100 mg via ORAL
  Filled 2014-09-15 (×11): qty 2

## 2014-09-15 MED ORDER — FERROUS SULFATE 325 (65 FE) MG PO TABS
325.0000 mg | ORAL_TABLET | Freq: Two times a day (BID) | ORAL | Status: DC
  Administered 2014-09-16 – 2014-09-17 (×4): 325 mg via ORAL
  Filled 2014-09-15 (×5): qty 1

## 2014-09-15 NOTE — ED Notes (Addendum)
Patient states he has a history of CHF and has a four day history of lower extremity swelling and a nine pound weight gain.  Complains of bilateral leg pain and abdominal swelling.

## 2014-09-15 NOTE — Progress Notes (Signed)
  PENDING ACCEPTANCE TRANFER NOTE:  Call received from:    [place] REQUESTING TRANSFER:    Acute CHF  CC: SOB  HPI:   31 with past medical history of CHF, multiple admissions to the hospital with CHF exacerbation came in with shortness of breath, lower extremity edema and 13 pounds weight gain. Will be transferred to [facility] for further evaluation.   PLAN:  According to telephone report, this patient was accepted for transfer to [facility], under [place] team:  MCAdmit,  I have requested an order be written to call Flow Manager at [phone] upon patient arrival to the floor for final physician assignment who will do the admission and give admitting orders.  SIGNED: Birdie Hopes, MD [facility]  09/15/2014, 4:17 PM

## 2014-09-15 NOTE — ED Notes (Signed)
Patient transported to X-ray 

## 2014-09-15 NOTE — ED Notes (Signed)
Patient placed on cardiac monitor.

## 2014-09-15 NOTE — ED Notes (Signed)
MD at bedside. 

## 2014-09-15 NOTE — Progress Notes (Signed)
Pt is new admit from [facility].  Arrived to floor via stretcher.  Oriented to room.  Call bell at bedside.  Instructed to call for assistance as needed.  Verbalized  understanding.  Will  Continue to monitor.  Karie Kirks, Therapist, sports.

## 2014-09-15 NOTE — ED Provider Notes (Addendum)
CSN: [id number]     Arrival date & time 09/15/14  1242 History   First MD Initiated Contact with Patient 09/15/14 1328     Chief Complaint  Patient presents with  . Leg Swelling     (Consider location/radiation/quality/duration/timing/severity/associated sxs/prior Treatment) HPI Comments: Pt comes in with c/o lower extremity edema. Denies chest pain. Denies fever. He states that he has been taking lasix as directed. He states that is is sob. He is not sure whether he has had wt gain  The history is provided by the patient. No language interpreter was used.    Past Medical History  Diagnosis Date  . Iron deficiency anemia   . Chronic combined systolic and diastolic CHF, NYHA class 2     a. 11/2013 Echo: EF 40-45%, basl-mid inflat AK, Gr 2 DD.  [name] Hyperlipidemia LDL goal <70   . Obstructive sleep apnea     a. not on home CPAP  . Ischemic cardiomyopathy     a. 11/2013 Echo: EF 40-45%, basl-mid inflat AK, Gr 2 DD, mild AI, mod dil LA/RA, mod reduced RV fxn, PASP 39mmHg.  [name] Homelessness   . Moderate to severe pulmonary hypertension 03/2010    a. PA peak pressure of 76 mmHg (per 2D Echo 03/2010)  . Polysubstance abuse     a. cocaine, THC  . AV malformation of gastrointestinal tract     a. w/ h/o GIB.  [name] Family history of early CAD   . DDD (degenerative disc disease), lumbar   . Peripheral vascular disease   . Seizure disorder   . Hypertension   . CAD (coronary artery disease)     a. s/p 3-vessel CABG (12/2007) // 100% RCA stenosis with collaterals from left system. Severe bifurcation lesions of proxima CXA and OM. Moderate LAD disease - followed by Dr. Wyline Copas in [facility];  b. 11/2013 Myoview: Large fixed inf defect w/o ischemia, EF 35%.  . Diabetes   . Chronic kidney disease (CKD), stage III (moderate)     a. BL SCr 1.5-1.6  . Renal artery stenosis   . Seizures    Past Surgical History  Procedure Laterality Date  . Apc  03/2010    To treat small bowel AVMs  .  Esophagogastroduodenoscopy N/A 08/14/2012    Procedure: ESOPHAGOGASTRODUODENOSCOPY (EGD);  Surgeon: [name], MD;  Location: [facility] ENDOSCOPY;  Service: Endoscopy;  Laterality: N/A;  . Hot hemostasis N/A 08/14/2012    Procedure: HOT HEMOSTASIS (ARGON PLASMA COAGULATION/BICAP);  Surgeon: [name], MD;  Location: [facility] ENDOSCOPY;  Service: Endoscopy;  Laterality: N/A;  . Esophagogastroduodenoscopy (egd) with propofol N/A 08/04/2013    Procedure: ESOPHAGOGASTRODUODENOSCOPY (EGD) WITH PROPOFOL;  Surgeon: [name], MD;  Location: [facility] ENDOSCOPY;  Service: Endoscopy;  Laterality: N/A;  . Coronary artery bypass graft  12/2007  . Esophagogastroduodenoscopy (egd) with propofol N/A 06/08/2014    Procedure: ESOPHAGOGASTRODUODENOSCOPY (EGD) WITH PROPOFOL;  Surgeon: Milus Banister, MD;  Location: [place];  Service: Endoscopy;  Laterality: N/A;  . Peripheral vascular catheterization N/A 09/07/2014    Procedure: Abdominal Aortogram;  Surgeon: Angelia Mould, MD;  Location: [place] CV LAB;  Service: Cardiovascular;  Laterality: N/A;   Family History  Problem Relation Age of Onset  . Heart disease Mother     unknown type  . Heart disease Father 30    died of MI at 6yo  . Heart disease Paternal Grandfather 10    died of MI  . Heart disease    . Heart  disease Brother 30   History  Substance Use Topics  . Smoking status: Current Some Day Smoker -- 0.25 packs/day for 56 years    Types: Cigarettes  . Smokeless tobacco: Never Used  . Alcohol Use: 0.0 oz/week    0 Standard drinks or equivalent per week     Comment: occasionally drinks, a few times a month    Review of Systems    Allergies  Motrin and Tylenol  Home Medications   Prior to Admission medications   Medication Sig Start Date End Date Taking? Authorizing Provider  albuterol (PROVENTIL HFA;VENTOLIN HFA) 108 (90 BASE) MCG/ACT inhaler Inhale 2 puffs into the lungs every 6 (six) hours as needed for wheezing or shortness of  breath. 04/30/14   Thurnell Lose, MD  albuterol (PROVENTIL HFA;VENTOLIN HFA) 108 (90 BASE) MCG/ACT inhaler Inhale 2 puffs into the lungs every 6 (six) hours as needed for wheezing or shortness of breath. 09/08/14   Ripudeep Krystal Eaton, MD  aspirin EC 325 MG tablet Take 1 tablet (325 mg total) by mouth daily. 09/08/14   Ripudeep Krystal Eaton, MD  budesonide-formoterol (SYMBICORT) 80-4.5 MCG/ACT inhaler Inhale 2 puffs into the lungs 2 (two) times daily. 09/08/14   Ripudeep Krystal Eaton, MD  citalopram (CELEXA) 20 MG tablet Take 20 mg by mouth daily.    Historical Provider, MD  cloNIDine (CATAPRES) 0.3 MG tablet Take 1 tablet (0.3 mg total) by mouth 2 (two) times daily. 09/14/14   Jolaine Artist, MD  cyclobenzaprine (FLEXERIL) 5 MG tablet Take 1 tablet (5 mg total) by mouth 3 (three) times daily as needed for muscle spasms (chest and leg pains). 07/05/14   Donne Hazel, MD  divalproex (DEPAKOTE ER) 500 MG 24 hr tablet Take 500 mg by mouth 2 (two) times daily. 05/29/14   Historical Provider, MD  ferrous sulfate 325 (65 FE) MG tablet Take 325 mg by mouth 3 (three) times daily with meals.     Historical Provider, MD  furosemide (LASIX) 80 MG tablet Take 1 tablet (80 mg total) by mouth 2 (two) times daily. 09/14/14   Jolaine Artist, MD  hydrALAZINE (APRESOLINE) 100 MG tablet Take 100 mg by mouth 3 (three) times daily. 05/29/14   Historical Provider, MD  HYDROcodone-acetaminophen (NORCO/VICODIN) 5-325 MG per tablet Take 2 tablets by mouth every 6 (six) hours as needed for moderate pain. 09/08/14   Ripudeep Krystal Eaton, MD  isosorbide mononitrate (IMDUR) 30 MG 24 hr tablet Take 2 tablets (60 mg total) by mouth daily. 07/26/14   Modena Jansky, MD  lisinopril (PRINIVIL,ZESTRIL) 20 MG tablet Take 0.5 tablets (10 mg total) by mouth 2 (two) times daily. 07/26/14   Modena Jansky, MD  metolazone (ZAROXOLYN) 5 MG tablet Take 1 tablet (5 mg total) by mouth as needed (for weight of 179 lb or greater). 09/14/14   Jolaine Artist, MD   nitroGLYCERIN (NITROSTAT) 0.4 MG SL tablet Place 1 tablet (0.4 mg total) under the tongue every 5 (five) minutes as needed for chest pain (upto max 3 doses at one time.). 07/26/14   Modena Jansky, MD  pantoprazole (PROTONIX) 40 MG tablet Take 40 mg by mouth daily.     Historical Provider, MD  potassium chloride SA (KLOR-CON M20) 20 MEQ tablet Take 20 mEq by mouth daily. 01/17/10   Historical Provider, MD  predniSONE (DELTASONE) 10 MG tablet Prednisone dosing: Take  Prednisone 30mg  (3 tabs) x 2 days, then 20mg  (2 tabs) x 3days, then 10mg  (1  tab) x 3days, then OFF. 09/08/14   Ripudeep K Rai, MD   BP 120/68 mmHg  Pulse 49  Temp(Src) 98.7 F (37.1 C) (Oral)  Resp 24  Ht 5\' 5"  (1.651 m)  Wt 183 lb (83.008 kg)  BMI 30.45 kg/m2  SpO2 96% Physical Exam  Constitutional: He is oriented to person, place, and time.  Cardiovascular: Normal rate and regular rhythm.   Pulmonary/Chest: Effort normal. He has rales.  Abdominal: Bowel sounds are normal.  Musculoskeletal:  Mild edema noted to bilateral lower extremities  Neurological: He is alert and oriented to person, place, and time.  Skin: Skin is warm and dry.  Nursing note and vitals reviewed.   ED Course  Procedures (including critical care time) Labs Review Labs Reviewed  BASIC METABOLIC PANEL - Abnormal; Notable for the following:    BUN 29 (*)    Creatinine, Ser 1.53 (*)    Calcium 8.5 (*)    GFR calc non Af Amer 44 (*)    GFR calc Af Amer 51 (*)    All other components within normal limits  CBC WITH DIFFERENTIAL/PLATELET - Abnormal; Notable for the following:    RBC 2.87 (*)    Hemoglobin 8.0 (*)    HCT 25.8 (*)    RDW 24.7 (*)    Neutrophils Relative % 82 (*)    Lymphocytes Relative 5 (*)    Basophils Relative 2 (*)    nRBC 7 (*)    Neutro Abs 7.8 (*)    Lymphs Abs 0.5 (*)    Basophils Absolute 0.2 (*)    All other components within normal limits  TROPONIN I - Abnormal; Notable for the following:    Troponin I 0.05 (*)     All other components within normal limits  BRAIN NATRIURETIC PEPTIDE    Imaging Review Dg Chest 2 View  09/15/2014   CLINICAL DATA:  69 year old male with a history of congestive heart failure. Four day history of lower extremity swelling.  EXAM: CHEST - 2 VIEW  COMPARISON:  09/13/2014, 08/29/2014, 08/27/2014  FINDINGS: Cardiomediastinal silhouette unchanged, with persisting cardiomegaly.  Atherosclerosis.  Surgical changes of median sternotomy and coronary artery bypass grafting.  Slight improvement in aeration from the comparison study, with improved lung volumes and decreasing prominence of interlobular septal thickening and interstitial opacities.  No evidence of pleural effusion.  No pneumothorax.  No displaced fracture.  Unremarkable appearance of the upper abdomen.  IMPRESSION: Improving aeration with mild persisting pulmonary vascular congestion.  No pleural effusions or confluent airspace disease.  Cardiomegaly.  Signed,  Dulcy Fanny. Earleen Newport, DO  [facility]  [facility]   Electronically Signed   By: Corrie Mckusick D.O.   On: 09/15/2014 15:23     EKG Interpretation   Date/Time:  Tuesday September 15 2014 14:01:03 EDT Ventricular Rate:  55 PR Interval:  174 QRS Duration: 116 QT Interval:  464 QTC Calculation: 443 R Axis:   85 Text Interpretation:  Sinus bradycardia Nonspecific ST and T wave  abnormality Abnormal ECG No significant change since last tracing  Confirmed by [facility]  MD, MARTHA [phone]) on 09/15/2014 2:36:31 PM      MDM   Final diagnoses:  Acute heart failure, unspecified heart failure type  Chronic leg pain, unspecified laterality    Think pt needs to be admitted for acute on chronic failure. Pt in no acute distress at this time. Pt given lasix here.pt going to [facility] for admission. Lasix given here. Pt  seems to have chronic elevated trop.    Glendell Docker, NP 09/15/14 1627  Alfonzo Beers, MD 09/18/14 1526  Glendell Docker, NP 09/26/14 Sun, MD 10/06/14 [phone]

## 2014-09-15 NOTE — H&P (Signed)
[facility] History and Physical  Patient: Charles [name]  MRN: [id number]  DOB: 1946/01/31  DOS: the patient was seen and examined on 09/15/2014 PCP: No primary care provider on file.  Referring physician: Dr. Canary Brim Chief Complaint: Shortness of breath abdominal pain as well as leg pain  HPI: Charles [name] is a 69 y.o. male with Past medical history of chronic combined systolic and diastolic CHF, noncompliance with medical regimen, chronic cocaine abuse, iron deficiency anemia, severe peripheral vascular disease, history of AVM, history of seizures, chronic kidney disease, essential hypertension. The patient is presenting with complaints of worsening shortness of breath, leg swelling as well as abdominal distention. Patient was recently hospitalized for acute on chronic combined CHF and was discharged on 40 mg of Lasix twice a day, recently saw his heart failure clinic and his Lasix was increased to 80 mg twice a day also his clonidine was increased to 0.3 mg twice a day. Patient mentions that he currently is taking 40 mg Lasix daily. He is taking clonidine 0.3 mg twice a day. He also said he is taking carvedilol which was on his list of medication on arrival from [facility] [place].[name] He mentions he has taken Zaroxolyn this morning on 09/15/2014. He denies any increase or decrease in urine output, he denies any diarrhea or constipation, he denies any chest pain, no fever no chills no cough no burning urination. He says he has used cocaine 2 weeks ago but now he has quit. He denies any alcohol abuse he denies any smoking. He mentions his weight this morning was "170 something". His primary complaint is bilateral leg pain is affecting his whole leg as well as abdominal bloating and diffuse abdominal pain which is dull ache.  The patient is coming from home.  At his baseline ambulates without any support And is independent for most of his ADL manages her medication on his  own.  Review of Systems: as mentioned in the history of present illness.  A comprehensive review of the other systems is negative.  Past Medical History  Diagnosis Date  . Iron deficiency anemia   . Chronic combined systolic and diastolic CHF, NYHA class 2     a. 11/2013 Echo: EF 40-45%, basl-mid inflat AK, Gr 2 DD.  [name] Hyperlipidemia LDL goal <70   . Obstructive sleep apnea     a. not on home CPAP  . Ischemic cardiomyopathy     a. 11/2013 Echo: EF 40-45%, basl-mid inflat AK, Gr 2 DD, mild AI, mod dil LA/RA, mod reduced RV fxn, PASP 93mmHg.  [name] Homelessness   . Moderate to severe pulmonary hypertension 03/2010    a. PA peak pressure of 76 mmHg (per 2D Echo 03/2010)  . Polysubstance abuse     a. cocaine, THC  . AV malformation of gastrointestinal tract     a. w/ h/o GIB.  [name] Family history of early CAD   . DDD (degenerative disc disease), lumbar   . Peripheral vascular disease   . Seizure disorder   . Hypertension   . CAD (coronary artery disease)     a. s/p 3-vessel CABG (12/2007) // 100% RCA stenosis with collaterals from left system. Severe bifurcation lesions of proxima CXA and OM. Moderate LAD disease - followed by Dr. Wyline Copas in [facility];  b. 11/2013 Myoview: Large fixed inf defect w/o ischemia, EF 35%.  . Diabetes   . Chronic kidney disease (CKD), stage III (moderate)     a. BL SCr  1.5-1.6  . Renal artery stenosis   . Seizures    Past Surgical History  Procedure Laterality Date  . Apc  03/2010    To treat small bowel AVMs  . Esophagogastroduodenoscopy N/A 08/14/2012    Procedure: ESOPHAGOGASTRODUODENOSCOPY (EGD);  Surgeon: [name], MD;  Location[facility] ENDOSCOPY;  Service: Endoscopy;  Laterality: N/A;  . Hot hemostasis N/A 08/14/2012    Procedure: HOT HEMOSTASIS (ARGON PLASMA COAGULATION/BICAP);  Surgeon: [name], MD;  Location: [facility] ENDOSCOPY;  Service: Endoscopy;  Laterality: N/A;  . Esophagogastroduodenoscopy (egd) with propofol N/A 08/04/2013    Procedure:  ESOPHAGOGASTRODUODENOSCOPY (EGD) WITH PROPOFOL;  Surgeon: [name], MD;  Location: [facility] ENDOSCOPY;  Service: Endoscopy;  Laterality: N/A;  . Coronary artery bypass graft  12/2007  . Esophagogastroduodenoscopy (egd) with propofol N/A 06/08/2014    Procedure: ESOPHAGOGASTRODUODENOSCOPY (EGD) WITH PROPOFOL;  Surgeon: Milus Banister, MD;  Location: [place];  Service: Endoscopy;  Laterality: N/A;  . Peripheral vascular catheterization N/A 09/07/2014    Procedure: Abdominal Aortogram;  Surgeon: Angelia Mould, MD;  [facility];  Service: Cardiovascular;  Laterality: N/A;   Social History:  reports that he has been smoking Cigarettes.  He has a 14 pack-year smoking history. He has never used smokeless tobacco. He reports that he drinks alcohol. He reports that he uses illicit drugs (Cocaine and Marijuana) about once per week.  Allergies  Allergen Reactions  . Motrin [Ibuprofen] Other (See Comments)    Affects kidneys  . Tylenol [Acetaminophen] Other (See Comments)    Affects kidneys    Family History  Problem Relation Age of Onset  . Heart disease Mother     unknown type  . Heart disease Father 89    died of MI at 71yo  . Heart disease Paternal Grandfather 8    died of MI  . Heart disease    . Heart disease Brother 30    Prior to Admission medications   Medication Sig Start Date End Date Taking? Authorizing Provider  albuterol (PROVENTIL HFA;VENTOLIN HFA) 108 (90 BASE) MCG/ACT inhaler Inhale 2 puffs into the lungs every 6 (six) hours as needed for wheezing or shortness of breath. 04/30/14  Yes Thurnell Lose, MD  aspirin EC 325 MG tablet Take 1 tablet (325 mg total) by mouth daily. 09/08/14  Yes Ripudeep Krystal Eaton, MD  budesonide-formoterol (SYMBICORT) 80-4.5 MCG/ACT inhaler Inhale 2 puffs into the lungs 2 (two) times daily. 09/08/14  Yes Ripudeep Krystal Eaton, MD  citalopram (CELEXA) 20 MG tablet Take 20 mg by mouth daily.   Yes Historical Provider, MD  cloNIDine  (CATAPRES) 0.3 MG tablet Take 1 tablet (0.3 mg total) by mouth 2 (two) times daily. 09/14/14  Yes Jolaine Artist, MD  divalproex (DEPAKOTE ER) 500 MG 24 hr tablet Take 500 mg by mouth 2 (two) times daily. 05/29/14  Yes Historical Provider, MD  ferrous sulfate 325 (65 FE) MG tablet Take 325 mg by mouth 2 (two) times daily with a meal.    Yes Historical Provider, MD  furosemide (LASIX) 40 MG tablet Take 40 mg by mouth daily. 08/27/14  Yes Historical Provider, MD  hydrALAZINE (APRESOLINE) 100 MG tablet Take 100 mg by mouth 3 (three) times daily. 05/29/14  Yes Historical Provider, MD  HYDROcodone-acetaminophen (NORCO/VICODIN) 5-325 MG per tablet Take 2 tablets by mouth every 6 (six) hours as needed for moderate pain. 09/08/14  Yes Ripudeep Krystal Eaton, MD  isosorbide mononitrate (IMDUR) 30 MG 24 hr tablet Take 2  tablets (60 mg total) by mouth daily. 07/26/14  Yes Modena Jansky, MD  lisinopril (PRINIVIL,ZESTRIL) 20 MG tablet Take 0.5 tablets (10 mg total) by mouth 2 (two) times daily. 07/26/14  Yes Modena Jansky, MD  metolazone (ZAROXOLYN) 5 MG tablet Take 1 tablet (5 mg total) by mouth as needed (for weight of 179 lb or greater). 09/14/14  Yes Jolaine Artist, MD  nitroGLYCERIN (NITROSTAT) 0.4 MG SL tablet Place 1 tablet (0.4 mg total) under the tongue every 5 (five) minutes as needed for chest pain (upto max 3 doses at one time.). 07/26/14  Yes Modena Jansky, MD  pantoprazole (PROTONIX) 40 MG tablet Take 40 mg by mouth daily.    Yes Historical Provider, MD  potassium chloride SA (KLOR-CON M20) 20 MEQ tablet Take 20 mEq by mouth daily. 01/17/10  Yes Historical Provider, MD  albuterol (PROVENTIL HFA;VENTOLIN HFA) 108 (90 BASE) MCG/ACT inhaler Inhale 2 puffs into the lungs every 6 (six) hours as needed for wheezing or shortness of breath. Patient not taking: Reported on 09/15/2014 09/08/14   Ripudeep Krystal Eaton, MD  cyclobenzaprine (FLEXERIL) 5 MG tablet Take 1 tablet (5 mg total) by mouth 3 (three) times daily as  needed for muscle spasms (chest and leg pains). Patient not taking: Reported on 09/15/2014 07/05/14   Donne Hazel, MD  furosemide (LASIX) 80 MG tablet Take 1 tablet (80 mg total) by mouth 2 (two) times daily. Patient not taking: Reported on 09/15/2014 09/14/14   Jolaine Artist, MD  predniSONE (DELTASONE) 10 MG tablet Prednisone dosing: Take  Prednisone 30mg  (3 tabs) x 2 days, then 20mg  (2 tabs) x 3days, then 10mg  (1 tab) x 3days, then OFF. Patient not taking: Reported on 09/15/2014 09/08/14   Ripudeep Krystal Eaton, MD    Physical Exam: Filed Vitals:   09/15/14 1600 09/15/14 1630 09/15/14 1700 09/15/14 1839  BP: 138/77 154/65 136/79 148/83  Pulse: 57 63 52 52  Temp:    99 F (37.2 C)  TempSrc:    Oral  Resp: 18 30 23 22   Height:    5\' 5"  (1.651 m)  Weight:    79.969 kg (176 lb 4.8 oz)  SpO2: 92% 93% 99% 100%    General: Alert, Awake and Oriented to Time, Place and Person. Appear in mild distress Eyes: PERRL ENT: Oral Mucosa clear moist. Neck: Positive JVD Cardiovascular: S1 and S2 Present, aortic systolic Murmur, Peripheral Pulses Present Respiratory: Bilateral Air entry equal and Decreased,  Bilateral Crackles, no wheezes Abdomen: Bowel Sound present, Soft and diffusely tender Skin: no Rash Extremities: Bilateral Pedal edema, generalized leg tenderness Neurologic: Grossly no focal neuro deficit.  Labs on Admission:  CBC:  Recent Labs Lab 09/15/14 1430  WBC 9.3  NEUTROABS 7.8*  HGB 8.0*  HCT 25.8*  MCV 89.9  PLT 265    CMP     Component Value Date/Time   NA 138 09/15/2014 1430   K 4.4 09/15/2014 1430   CL 104 09/15/2014 1430   CO2 24 09/15/2014 1430   GLUCOSE 92 09/15/2014 1430   BUN 29* 09/15/2014 1430   CREATININE 1.53* 09/15/2014 1430   CREATININE 1.32 05/11/2010 2300   CALCIUM 8.5* 09/15/2014 1430   PROT 6.8 08/31/2014 0430   ALBUMIN 2.8* 09/08/2014 0444   AST 25 08/31/2014 0430   ALT 13* 08/31/2014 0430   ALKPHOS 75 08/31/2014 0430   BILITOT 0.7  08/31/2014 0430   GFRNONAA 44* 09/15/2014 1430   GFRAA 51* 09/15/2014 1430  No results for input(s): LIPASE, AMYLASE in the last 168 hours.   Recent Labs Lab 09/15/14 1430  TROPONINI 0.05*   BNP (last 3 results)  Recent Labs  09/14/14 1123 09/14/14 1212 09/15/14 1430  BNP 3315.0* 3617.4* 3756.9*    ProBNP (last 3 results)  Recent Labs  01/01/14 1107 02/07/14 0123 03/01/14 2250  PROBNP 3225.0* 2648.0* 7421.0*     Radiological Exams on Admission: Dg Chest 2 View  09/15/2014   CLINICAL DATA:  69 year old male with a history of congestive heart failure. Four day history of lower extremity swelling.  EXAM: CHEST - 2 VIEW  COMPARISON:  09/13/2014, 08/29/2014, 08/27/2014  FINDINGS: Cardiomediastinal silhouette unchanged, with persisting cardiomegaly.  Atherosclerosis.  Surgical changes of median sternotomy and coronary artery bypass grafting.  Slight improvement in aeration from the comparison study, with improved lung volumes and decreasing prominence of interlobular septal thickening and interstitial opacities.  No evidence of pleural effusion.  No pneumothorax.  No displaced fracture.  Unremarkable appearance of the upper abdomen.  IMPRESSION: Improving aeration with mild persisting pulmonary vascular congestion.  No pleural effusions or confluent airspace disease.  Cardiomegaly.  Signed,  Dulcy Fanny. Earleen Newport, DO  [facility]  [facility]   Electronically Signed   By: Corrie Mckusick D.O.   On: 09/15/2014 15:23   EKG: Independently reviewed. normal sinus rhythm, nonspecific ST and T waves changes.  Assessment/Plan Principal Problem:   Acute on chronic combined systolic and diastolic CHF (congestive heart failure) Active Problems:   Iron deficiency anemia   Hypertension   CAD- CABG x3 '09. Myoview no ischemia 11/27/13   Peripheral vascular disease   Bradycardia   Convulsions/seizures   Leg pain, bilateral   Cocaine abuse   1.  Acute on chronic combined systolic and diastolic CHF (congestive heart failure) The patient is presenting with complaints of significant weight gain as well as shortness of breath. His BNP is elevated again. He has received 40 mg of IV Lasix and I will continue 40 mg of IV Lasix twice a day. Monitor in and out. Daily weight. Due to patient's recurrent readmission and continue use of cocaine abuse despite multiple counseling, I highly questions patient's compliance with his medications and as well as dietary regimen.  2. Essential hypertension. Cocaine abuse. Coronary artery disease. Bradycardia.  EKG shows sinus bradycardia. The patient continues to abuse cocaine. He mentions he is taking Coreg but I do not see any recent prescription from recent hospitalization or visit the heart failure clinic. Therefore I would discontinue Coreg due to his cocaine abuse. Holding off on lisinopril. Continuing with Lasix, hydralazine, Imdur. Due to bradycardia changing clonidine starting tomorrow. Blood pressure currently stable if fluctuates can use when necessary hydralazine.  3. Bilateral leg pain, peripheral vascular disease, leg swelling. Patient has significant peripheral vascular disease with ongoing cocaine abuse. Currently continue aspirin. Anticipating improvement in patient's pain with improvement in swelling. TED stockings.  4. Abdominal pain chronic. The patient is a chronic abdominal pain. We will check lipase, patient has history of constipation will check x-ray abdomen. And treat as needed.  5. History of  seizure. Continue home medication.  6. Iron deficiency anemia. Continue iron supplements.  Advance goals of care discussion: Full code   DVT Prophylaxis: subcutaneous Heparin Nutrition: npo  Disposition: Admitted as inpatient, telemetry unit.  Author: Berle Mull, MD [facility] Pager: [phone] 09/15/2014  If 7PM-7AM, please contact  night-coverage [email or web address] Password [email or web address]

## 2014-09-15 NOTE — ED Notes (Signed)
Pt sts he wants to go to [facility].  Sts [facility] ambulance refused to take him there. Sts they told him it was because of their company policy. Pt sts he always goes to [facility] and "they get this fluid off of me and give me pain medicine.  My legs hurt bad."

## 2014-09-16 ENCOUNTER — Inpatient Hospital Stay (HOSPITAL_COMMUNITY): Payer: Medicare Other

## 2014-09-16 DIAGNOSIS — I5043 Acute on chronic combined systolic (congestive) and diastolic (congestive) heart failure: Secondary | ICD-10-CM

## 2014-09-16 DIAGNOSIS — F1410 Cocaine abuse, uncomplicated: Secondary | ICD-10-CM

## 2014-09-16 DIAGNOSIS — I10 Essential (primary) hypertension: Secondary | ICD-10-CM

## 2014-09-16 DIAGNOSIS — I739 Peripheral vascular disease, unspecified: Secondary | ICD-10-CM

## 2014-09-16 LAB — COMPREHENSIVE METABOLIC PANEL
ALT: 28 U/L (ref 17–63)
AST: 39 U/L (ref 15–41)
Albumin: 2.8 g/dL — ABNORMAL LOW (ref 3.5–5.0)
Alkaline Phosphatase: 86 U/L (ref 38–126)
Anion gap: 11 (ref 5–15)
BILIRUBIN TOTAL: 1 mg/dL (ref 0.3–1.2)
BUN: 26 mg/dL — ABNORMAL HIGH (ref 6–20)
CO2: 24 mmol/L (ref 22–32)
Calcium: 8.4 mg/dL — ABNORMAL LOW (ref 8.9–10.3)
Chloride: 102 mmol/L (ref 101–111)
Creatinine, Ser: 1.82 mg/dL — ABNORMAL HIGH (ref 0.61–1.24)
GFR calc non Af Amer: 36 mL/min — ABNORMAL LOW (ref >60)
GFR, EST AFRICAN AMERICAN: 42 mL/min — AB (ref >60)
Glucose, Bld: 101 mg/dL — ABNORMAL HIGH (ref 65–99)
Potassium: 4 mmol/L (ref 3.5–5.1)
Sodium: 137 mmol/L (ref 135–145)
TOTAL PROTEIN: 6.1 g/dL — AB (ref 6.5–8.1)

## 2014-09-16 LAB — BASIC METABOLIC PANEL
Anion gap: 12 (ref 5–15)
BUN: 27 mg/dL — ABNORMAL HIGH (ref 6–20)
CO2: 23 mmol/L (ref 22–32)
CREATININE: 1.84 mg/dL — AB (ref 0.61–1.24)
Calcium: 8.4 mg/dL — ABNORMAL LOW (ref 8.9–10.3)
Chloride: 103 mmol/L (ref 101–111)
GFR calc Af Amer: 41 mL/min — ABNORMAL LOW (ref >60)
GFR, EST NON AFRICAN AMERICAN: 35 mL/min — AB (ref >60)
Glucose, Bld: 130 mg/dL — ABNORMAL HIGH (ref 65–99)
Potassium: 3.7 mmol/L (ref 3.5–5.1)
Sodium: 138 mmol/L (ref 135–145)

## 2014-09-16 LAB — CBC WITH DIFFERENTIAL/PLATELET
Basophils Absolute: 0 10*3/uL (ref 0.0–0.1)
Basophils Relative: 0 % (ref 0–1)
EOS ABS: 0.2 10*3/uL (ref 0.0–0.7)
Eosinophils Relative: 3 % (ref 0–5)
HCT: 24.9 % — ABNORMAL LOW (ref 39.0–52.0)
Hemoglobin: 7.7 g/dL — ABNORMAL LOW (ref 13.0–17.0)
LYMPHS PCT: 9 % — AB (ref 12–46)
Lymphs Abs: 0.6 10*3/uL — ABNORMAL LOW (ref 0.7–4.0)
MCH: 27.9 pg (ref 26.0–34.0)
MCHC: 30.9 g/dL (ref 30.0–36.0)
MCV: 90.2 fL (ref 78.0–100.0)
Monocytes Absolute: 0.7 10*3/uL (ref 0.1–1.0)
Monocytes Relative: 11 % (ref 3–12)
NEUTROS PCT: 77 % (ref 43–77)
Neutro Abs: 5 10*3/uL (ref 1.7–7.7)
PLATELETS: 238 10*3/uL (ref 150–400)
RBC: 2.76 MIL/uL — ABNORMAL LOW (ref 4.22–5.81)
RDW: 23.9 % — ABNORMAL HIGH (ref 11.5–15.5)
WBC: 6.5 10*3/uL (ref 4.0–10.5)

## 2014-09-16 LAB — CK: CK TOTAL: 35 U/L — AB (ref 49–397)

## 2014-09-16 LAB — TROPONIN I
Troponin I: 0.04 ng/mL — ABNORMAL HIGH (ref <0.031)
Troponin I: 0.04 ng/mL — ABNORMAL HIGH (ref <0.031)

## 2014-09-16 MED ORDER — AMLODIPINE BESYLATE 5 MG PO TABS
5.0000 mg | ORAL_TABLET | Freq: Every day | ORAL | Status: DC
  Administered 2014-09-16: 5 mg via ORAL
  Filled 2014-09-16 (×2): qty 1

## 2014-09-16 MED ORDER — FUROSEMIDE 10 MG/ML IJ SOLN
80.0000 mg | Freq: Three times a day (TID) | INTRAMUSCULAR | Status: DC
  Administered 2014-09-16 – 2014-09-17 (×3): 80 mg via INTRAVENOUS
  Filled 2014-09-16 (×4): qty 8

## 2014-09-16 MED ORDER — METOLAZONE 5 MG PO TABS
5.0000 mg | ORAL_TABLET | Freq: Every day | ORAL | Status: DC
  Administered 2014-09-16 – 2014-09-18 (×3): 5 mg via ORAL
  Filled 2014-09-16 (×3): qty 1

## 2014-09-16 MED ORDER — MORPHINE SULFATE 2 MG/ML IJ SOLN
1.0000 mg | INTRAMUSCULAR | Status: DC | PRN
  Administered 2014-09-17 – 2014-09-18 (×3): 1 mg via INTRAVENOUS
  Filled 2014-09-16 (×5): qty 1

## 2014-09-16 MED ORDER — DIAZEPAM 5 MG PO TABS
5.0000 mg | ORAL_TABLET | Freq: Three times a day (TID) | ORAL | Status: DC | PRN
  Administered 2014-09-17: 5 mg via ORAL
  Filled 2014-09-16: qty 1

## 2014-09-16 MED ORDER — OXYCODONE HCL 5 MG PO TABS
5.0000 mg | ORAL_TABLET | ORAL | Status: DC | PRN
  Administered 2014-09-16 – 2014-09-18 (×6): 5 mg via ORAL
  Filled 2014-09-16 (×6): qty 1

## 2014-09-16 MED ORDER — POLYETHYLENE GLYCOL 3350 17 G PO PACK
17.0000 g | PACK | Freq: Two times a day (BID) | ORAL | Status: DC
  Administered 2014-09-16: 17 g via ORAL
  Filled 2014-09-16 (×5): qty 1

## 2014-09-16 NOTE — Consult Note (Signed)
Advanced Heart Failure Team Consult Note  Referring Physician: Dr Sloan Leiter Primary Physician: Primary Cardiologist:  Dr Haroldine Laws  Reason for Consultation: Heart Failure   HPI:   Mr. Esty is a 69 y.o. year old male with prior h/o cocaine abuse, GI bleed, diastolic CHF (EF 72-53% 6/64), CAD s/p CABG 2009, and stage 3 CKD, Myoview in December 2014 no ischemia and PAD ABI- R 0.42 and L 0.22. He has had multiple hospitalizations due to acute HF.   Most recent discharge 09/08/2014 with volume overload. Diuresed with IV lasix and transitioned to lasix 40 mg twice a day. D/C weight 170 pounds.   He has bee followed closely in the HF clinic and was seen earlier this week. Given IV lasix + metolazone. Poor response reported. He returned to ED 6/28 with increased dyspnea and leg pain. Diuresing with IV lasix. CXR with vascular congestion.   SOB at rest. SOB with exertion. Denies CP.    Pertinent admission labs include: K 4.4 Creatinine 1.53 BNP 3756 Troponin 0.05 Hgb 8.3     SH: Lives with daughter in [place], drinks 2 40 oz cans beer a week, uses cocaine and smokes 2 cigs a day . Stopped using drugs . Has ankle monitor FH: No cardiac history Review of Systems: [y] = yes, [ ]  = no   General: Weight gain [Y ]; Weight loss [ ] ; Anorexia [ ] ; Fatigue [ Y]; Fever [ ] ; Chills [ ] ; Weakness [Y ]  Cardiac: Chest pain/pressure [ ] ; Resting SOB [Y ]; Exertional SOB [Y ]; Orthopnea [ ] ; Pedal Edema [Y ]; Palpitations [ ] ; Syncope [ ] ; Presyncope [ ] ; Paroxysmal nocturnal dyspnea[ ]   Pulmonary: Cough [ ] ; Wheezing[ ] ; Hemoptysis[ ] ; Sputum [ ] ; Snoring [ ]   GI: Vomiting[ ] ; Dysphagia[ ] ; Melena[ ] ; Hematochezia [ ] ; Heartburn[ ] ; Abdominal pain [ ] ; Constipation [ ] ; Diarrhea [ ] ; BRBPR [ ]   GU: Hematuria[ ] ; Dysuria [ ] ; Nocturia[ ]   Vascular: Pain in legs with walking [Y ]; Pain in feet with lying flat [ ] ; Non-healing sores [ ] ; Stroke [ ] ; TIA [ ] ; Slurred speech [ ] ;  Neuro: Headaches[ ] ;  Vertigo[ ] ; Seizures[ ] ; Paresthesias[ ] ;Blurred vision [ ] ; Diplopia [ ] ; Vision changes [ ]   Ortho/Skin: Arthritis [ ] ; Joint pain [ ] ; Muscle pain [ ] ; Joint swelling [ ] ; Back Pain [ ] ; Rash [ ]   Psych: Depression[ ] ; Anxiety[ ]   Heme: Bleeding problems [ ] ; Clotting disorders [ ] ; Anemia [ ]   Endocrine: Diabetes [ ] ; Thyroid dysfunction[ ]   Home Medications Prior to Admission medications   Medication Sig Start Date End Date Taking? Authorizing Provider  albuterol (PROVENTIL HFA;VENTOLIN HFA) 108 (90 BASE) MCG/ACT inhaler Inhale 2 puffs into the lungs every 6 (six) hours as needed for wheezing or shortness of breath. 04/30/14  Yes Thurnell Lose, MD  aspirin EC 325 MG tablet Take 1 tablet (325 mg total) by mouth daily. 09/08/14  Yes Ripudeep Krystal Eaton, MD  budesonide-formoterol (SYMBICORT) 80-4.5 MCG/ACT inhaler Inhale 2 puffs into the lungs 2 (two) times daily. 09/08/14  Yes Ripudeep Krystal Eaton, MD  citalopram (CELEXA) 20 MG tablet Take 20 mg by mouth daily.   Yes Historical Provider, MD  cloNIDine (CATAPRES) 0.3 MG tablet Take 1 tablet (0.3 mg total) by mouth 2 (two) times daily. 09/14/14  Yes Jolaine Artist, MD  divalproex (DEPAKOTE ER) 500 MG 24 hr tablet Take 500 mg by mouth 2 (two) times daily. 05/29/14  Yes Historical Provider, MD  ferrous sulfate 325 (65 FE) MG tablet Take 325 mg by mouth 2 (two) times daily with a meal.    Yes Historical Provider, MD  furosemide (LASIX) 40 MG tablet Take 40 mg by mouth daily. 08/27/14  Yes Historical Provider, MD  hydrALAZINE (APRESOLINE) 100 MG tablet Take 100 mg by mouth 3 (three) times daily. 05/29/14  Yes Historical Provider, MD  HYDROcodone-acetaminophen (NORCO/VICODIN) 5-325 MG per tablet Take 2 tablets by mouth every 6 (six) hours as needed for moderate pain. 09/08/14  Yes Ripudeep Krystal Eaton, MD  isosorbide mononitrate (IMDUR) 30 MG 24 hr tablet Take 2 tablets (60 mg total) by mouth daily. 07/26/14  Yes Modena Jansky, MD  lisinopril (PRINIVIL,ZESTRIL) 20 MG  tablet Take 0.5 tablets (10 mg total) by mouth 2 (two) times daily. 07/26/14  Yes Modena Jansky, MD  metolazone (ZAROXOLYN) 5 MG tablet Take 1 tablet (5 mg total) by mouth as needed (for weight of 179 lb or greater). 09/14/14  Yes Jolaine Artist, MD  nitroGLYCERIN (NITROSTAT) 0.4 MG SL tablet Place 1 tablet (0.4 mg total) under the tongue every 5 (five) minutes as needed for chest pain (upto max 3 doses at one time.). 07/26/14  Yes Modena Jansky, MD  pantoprazole (PROTONIX) 40 MG tablet Take 40 mg by mouth daily.    Yes Historical Provider, MD  potassium chloride SA (KLOR-CON M20) 20 MEQ tablet Take 20 mEq by mouth daily. 01/17/10  Yes Historical Provider, MD  albuterol (PROVENTIL HFA;VENTOLIN HFA) 108 (90 BASE) MCG/ACT inhaler Inhale 2 puffs into the lungs every 6 (six) hours as needed for wheezing or shortness of breath. Patient not taking: Reported on 09/15/2014 09/08/14   Ripudeep Krystal Eaton, MD  cyclobenzaprine (FLEXERIL) 5 MG tablet Take 1 tablet (5 mg total) by mouth 3 (three) times daily as needed for muscle spasms (chest and leg pains). Patient not taking: Reported on 09/15/2014 07/05/14   Donne Hazel, MD  furosemide (LASIX) 80 MG tablet Take 1 tablet (80 mg total) by mouth 2 (two) times daily. Patient not taking: Reported on 09/15/2014 09/14/14   Jolaine Artist, MD  predniSONE (DELTASONE) 10 MG tablet Prednisone dosing: Take  Prednisone 30mg  (3 tabs) x 2 days, then 20mg  (2 tabs) x 3days, then 10mg  (1 tab) x 3days, then OFF. Patient not taking: Reported on 09/15/2014 09/08/14   Ripudeep Krystal Eaton, MD    Past Medical History: Past Medical History  Diagnosis Date  . Iron deficiency anemia   . Chronic combined systolic and diastolic CHF, NYHA class 2     a. 11/2013 Echo: EF 40-45%, basl-mid inflat AK, Gr 2 DD.  [name] Hyperlipidemia LDL goal <70   . Obstructive sleep apnea     a. not on home CPAP  . Ischemic cardiomyopathy     a. 11/2013 Echo: EF 40-45%, basl-mid inflat AK, Gr 2 DD, mild AI, mod  dil LA/RA, mod reduced RV fxn, PASP 41mmHg.  [name] Homelessness   . Moderate to severe pulmonary hypertension 03/2010    a. PA peak pressure of 76 mmHg (per 2D Echo 03/2010)  . Polysubstance abuse     a. cocaine, THC  . AV malformation of gastrointestinal tract     a. w/ h/o GIB.  [name] Family history of early CAD   . DDD (degenerative disc disease), lumbar   . Peripheral vascular disease   . Seizure disorder   . Hypertension   . CAD (coronary artery disease)  a. s/p 3-vessel CABG (12/2007) // 100% RCA stenosis with collaterals from left system. Severe bifurcation lesions of proxima CXA and OM. Moderate LAD disease - followed by Dr. Wyline Copas in [facility];  b. 11/2013 Myoview: Large fixed inf defect w/o ischemia, EF 35%.  . Diabetes   . Chronic kidney disease (CKD), stage III (moderate)     a. BL SCr 1.5-1.6  . Renal artery stenosis   . Seizures     Past Surgical History: Past Surgical History  Procedure Laterality Date  . Apc  03/2010    To treat small bowel AVMs  . Esophagogastroduodenoscopy N/A 08/14/2012    Procedure: ESOPHAGOGASTRODUODENOSCOPY (EGD);  Surgeon: [name], MD;  Location: [facility] ENDOSCOPY;  Service: Endoscopy;  Laterality: N/A;  . Hot hemostasis N/A 08/14/2012    Procedure: HOT HEMOSTASIS (ARGON PLASMA COAGULATION/BICAP);  Surgeon: [name], MD;  Location[facility] ENDOSCOPY;  Service: Endoscopy;  Laterality: N/A;  . Esophagogastroduodenoscopy (egd) with propofol N/A 08/04/2013    Procedure: ESOPHAGOGASTRODUODENOSCOPY (EGD) WITH PROPOFOL;  Surgeon: [name], MD;  Location: [facility] ENDOSCOPY;  Service: Endoscopy;  Laterality: N/A;  . Coronary artery bypass graft  12/2007  . Esophagogastroduodenoscopy (egd) with propofol N/A 06/08/2014    Procedure: ESOPHAGOGASTRODUODENOSCOPY (EGD) WITH PROPOFOL;  Surgeon: Milus Banister, MD;  Location: [facility];  Service: Endoscopy;  Laterality: N/A;  . Peripheral vascular catheterization N/A 09/07/2014    Procedure: Abdominal Aortogram;   Surgeon: Angelia Mould, MD;  Location: [place] CV LAB;  Service: Cardiovascular;  Laterality: N/A;    Family History: Family History  Problem Relation Age of Onset  . Heart disease Mother     unknown type  . Heart disease Father 71    died of MI at 91yo  . Heart disease Paternal Grandfather 13    died of MI  . Heart disease    . Heart disease Brother 68    Social History: History   Social History  . Marital Status: Widowed    Spouse Name: N/A  . Number of Children: 2  . Years of Education: N/A   Occupational History  . Unemployed     previously worked in [employer]  .     Social History Main Topics  . Smoking status: Current Some Day Smoker -- 0.25 packs/day for 56 years    Types: Cigarettes  . Smokeless tobacco: Never Used  . Alcohol Use: 0.0 oz/week    0 Standard drinks or equivalent per week     Comment: occasionally drinks, a few times a month  . Drug Use: 1.00 per week    Special: Cocaine, Marijuana     Comment: uses cocaine once/wk at least.  Last used on Monday, May 30th  . Sexual Activity: No     Comment: pt states "I smoked a little small piece on 02/25/14   Other Topics Concern  . None   Social History Narrative   Lives in [place]. Originally from [place], has been in the [place] since 1980s      **11/2013 - says that he lives with dtr in [facility].**                      Allergies:  Allergies  Allergen Reactions  . Motrin [Ibuprofen] Other (See Comments)    Affects kidneys  . Tylenol [Acetaminophen] Other (See Comments)    Affects kidneys    Objective:    Vital Signs:   Temp:  [98.7 F (37.1 C)-99.6  F (37.6 C)] 98.9 F (37.2 C) (06/29 0532) Pulse Rate:  [49-65] 65 (06/29 0532) Resp:  [18-36] 20 (06/29 0532) BP: (120-158)/(63-92) 154/63 mmHg (06/29 0532) SpO2:  [92 %-100 %] 99 % (06/29 1004) Weight:  [176 lb 0.4 oz (79.843 kg)-183 lb (83.008 kg)] 176 lb 0.4 oz (79.843 kg) (06/29 0532)    Weight  change: Filed Weights   09/15/14 1246 09/15/14 1839 09/16/14 0532  Weight: 183 lb (83.008 kg) 176 lb 4.8 oz (79.969 kg) 176 lb 0.4 oz (79.843 kg)    Intake/Output:   Intake/Output Summary (Last 24 hours) at 09/16/14 1011 Last data filed at 09/16/14 0844  Gross per 24 hour  Intake   1320 ml  Output    380 ml  Net    940 ml     Physical Exam: General: Chronically ill appearing. No resp difficulty. Sitting on the side of the bed.  HEENT: normal Neck: supple. JVP to jaw  . Carotids 2+ bilat; no bruits. No lymphadenopathy or thryomegaly appreciated. Cor: PMI nondisplaced. Regular rate & rhythm. No rubs, gallops or murmurs. Lungs: clear Abdomen: soft, nontender, nondistended. No hepatosplenomegaly. No bruits or masses. Good bowel sounds. Extremities: no cyanosis, clubbing, rash, R and LLE 1+ edema. RLE police ankle monitor Neuro: alert & orientedx3, cranial nerves grossly intact. moves all 4 extremities w/o difficulty. Affect pleasant  Telemetry: Sinus Brady 50s   Labs: Basic Metabolic Panel:  Recent Labs Lab 09/15/14 1430 09/16/14 0135 09/16/14 0806  NA 138 138 137  K 4.4 3.7 4.0  CL 104 103 102  CO2 24 23 24   GLUCOSE 92 130* 101*  BUN 29* 27* 26*  CREATININE 1.53* 1.84* 1.82*  CALCIUM 8.5* 8.4* 8.4*    Liver Function Tests:  Recent Labs Lab 09/15/14 2023 09/16/14 0806  AST 50* 39  ALT 34 28  ALKPHOS 70 86  BILITOT 1.2 1.0  PROT 6.1* 6.1*  ALBUMIN 2.9* 2.8*    Recent Labs Lab 09/15/14 2023  LIPASE 28   No results for input(s): AMMONIA in the last 168 hours.  CBC:  Recent Labs Lab 09/15/14 1430 09/16/14 0806  WBC 9.3 6.5  NEUTROABS 7.8* 5.0  HGB 8.0* 7.7*  HCT 25.8* 24.9*  MCV 89.9 90.2  PLT 265 238    Cardiac Enzymes:  Recent Labs Lab 09/15/14 1430 09/15/14 2023 09/16/14 0135 09/16/14 0806  CKTOTAL  --   --  35*  --   TROPONINI 0.05* 0.04* 0.04* 0.04*    BNP: BNP (last 3 results)  Recent Labs  09/14/14 1123 09/14/14 1212  09/15/14 1430  BNP 3315.0* 3617.4* 3756.9*    ProBNP (last 3 results)  Recent Labs  01/01/14 1107 02/07/14 0123 03/01/14 2250  PROBNP 3225.0* 2648.0* 7421.0*     CBG: No results for input(s): GLUCAP in the last 168 hours.  Coagulation Studies: No results for input(s): LABPROT, INR in the last 72 hours.  Other results: EKG: Sinus Bradycardia 55 bpm  Imaging: Dg Chest 2 View  09/15/2014   CLINICAL DATA:  69 year old male with a history of congestive heart failure. Four day history of lower extremity swelling.  EXAM: CHEST - 2 VIEW  COMPARISON:  09/13/2014, 08/29/2014, 08/27/2014  FINDINGS: Cardiomediastinal silhouette unchanged, with persisting cardiomegaly.  Atherosclerosis.  Surgical changes of median sternotomy and coronary artery bypass grafting.  Slight improvement in aeration from the comparison study, with improved lung volumes and decreasing prominence of interlobular septal thickening and interstitial opacities.  No evidence of pleural effusion.  No pneumothorax.  No displaced fracture.  Unremarkable appearance of the upper abdomen.  IMPRESSION: Improving aeration with mild persisting pulmonary vascular congestion.  No pleural effusions or confluent airspace disease.  Cardiomegaly.  Signed,  Dulcy Fanny. Earleen Newport, DO  [facility]  [facility]   Electronically Signed   By: Corrie Mckusick D.O.   On: 09/15/2014 15:23   Dg Abd Portable 1v  09/15/2014   CLINICAL DATA:  Abdominal pain and distention.  Constipation.  EXAM: PORTABLE ABDOMEN - 1 VIEW  COMPARISON:  07/03/2014 and CT scan abdomen dated 07/22/2014  FINDINGS: Chronic cardiomegaly. Bowel gas pattern is normal. Vascular calcifications throughout the abdomen including the renal arteries. Slight lumbar scoliosis. No excessive stool in the colon.  IMPRESSION: Benign appearing abdomen.   Electronically Signed   By: Lorriane Shire M.D.   On: 09/15/2014 20:23      Medications:      Current Medications: . aspirin EC  325 mg Oral Daily  . budesonide-formoterol  2 puff Inhalation BID  . citalopram  20 mg Oral Daily  . cloNIDine  0.3 mg Oral BID  . divalproex  500 mg Oral BID  . ferrous sulfate  325 mg Oral BID WC  . folic acid  1 mg Oral Daily  . furosemide  40 mg Intravenous BID  . heparin  5,000 Units Subcutaneous 3 times per day  . hydrALAZINE  100 mg Oral 3 times per day  . isosorbide mononitrate  60 mg Oral Daily  . pantoprazole  40 mg Oral Daily  . polyethylene glycol  17 g Oral Daily  . sodium chloride  3 mL Intravenous Q12H  . vitamin B-12  1,000 mcg Oral Daily     Infusions:      Assessment:   1. Acute/Chronic Diastolic Heart Failure ICM EF 05/2014 50% Grade II DD.  2. Cocaine Abuse- trying to quit last positive 08/30/2014  3. HTN- 4. Anemia- H/O AVMS  5.  CAD CABG x s3 2009 on 81 mg aspirin daily, and simvastatin 40 mg daily.  6. PAD - schedule f/u with [facility].     Plan/Discussion:    Mr Pettengill is a 69 year old well known to HF team and has been followed in the clinic. Admitted with recurrent volume overload despite increased diuretic regimen at home. Increase IV lasix to 80 mg tid + metolazone 5 mg daily . Watch renal function closely.   BP elevated. Add 5 mg amlodipine. Continue current dose hydralazine/imdur/clonidine. No ace with elevated creatinine.   Consult cardiac rehab.   He is difficulty to manage due to poor insight and ongoing substance abuse.    Length of Stay: 1  CLEGG,AMY NP-C  09/16/2014, 10:11 AM  Advanced Heart Failure Team Pager [phone] (M-F; [place])  Please contact [facility] for night-coverage after hours (4p -7a ) and weekends on [email or web address]  Patient seen and examined with Darrick Grinder, NP. We discussed all aspects of the encounter. I agree with the assessment and plan as stated above.   Volume status doesn't look to bad. Actually better than it was in clinic. Will continue IV diuresis for now.  Treat HTN. Hopefully home in 24-48 hours.   Bensimhon, Daniel,MD 1:58 PM

## 2014-09-16 NOTE — Progress Notes (Signed)
PATIENT DETAILS Name: Rusell Meneely Age: 69 y.o. Sex: male Date of Birth: 15-May-1945 Admit Date: 09/15/2014 Admitting Physician Verlee Monte, MD PCP:No primary care provider on file.  Subjective: Breathing better, continues to have unchanged bilateral lower extremity pain. Complains of abdominal bloating.  Assessment/Plan: Principal Problem: Acute on chronic diastolic CHF ((EF 88-41% 6/60): Recurrent numerous admissions, suspect secondary to noncompliance, poor insight and ongoing cocaine use. Cardiology consulted,Continue diuretics. Monitor intake/output, daily weights. Follow lytes closely has stage III chronic kidney disease  Active Problems: Abdominal discomfort: Suspect mostly secondary to hepatic congestion from above. Check abdominal ultrasound to make sure no ascites. X-ray abdomen on admission negative for acute abnormalities. Supportive care and follow.  History of peripheral vascular disease: Likely etiology for bilateral foot pain.Recent ABI's showed severe peripheral vascular disease, right 0.42 and left 0.22. Patient underwent arteriogram on 6/20, recommendations were for aggressive conservative medical management, tobacco cessation. If symptoms progressed then a right-to-left femorofemoral femoropopliteal bypass at some point. Continue aspirin  Stage III chronic kidney disease: Creatinine close to usual baseline. Follow closely while on Lasix.  History of CAD: Status post CABG, continue aspirin. Avoid beta blockers given ongoing cocaine use.  History of? Cirrhosis of the liver: Noted radiographically on CT scan and also on ultrasound. Unclear etiology, but could have cardiac cirrhosis.  Hypertension: Controlled, continue amlodipine, clonidine, hydralazine and Imdur. Follow and titrate accordingly  Anemia: Suspect related to anemia of chronic disease/kidney failure. Follow and transfuse if hemoglobin less than 7.  Cocaine abuse: Claims last use 2  weeks ago. Counseling done again.  Suspected noncompliance to medications: Causing repeated Hospitalization. Counseled.  History of COPD: Currently stable. Continue Symbicort and nebs.   Disposition: Remain inpatient  Antimicrobial agents  See below  Anti-infectives    None      DVT Prophylaxis: Prophylactic  Heparin   Code Status: Full code   Family Communication None at bedside  Procedures: None  CONSULTS:  cardiology  Time spent 30 minutes-Greater than 50% of this time was spent in counseling, explanation of diagnosis, planning of further management, and coordination of care.  MEDICATIONS: Scheduled Meds: . amLODipine  5 mg Oral Daily  . aspirin EC  325 mg Oral Daily  . budesonide-formoterol  2 puff Inhalation BID  . citalopram  20 mg Oral Daily  . cloNIDine  0.3 mg Oral BID  . divalproex  500 mg Oral BID  . ferrous sulfate  325 mg Oral BID WC  . folic acid  1 mg Oral Daily  . furosemide  80 mg Intravenous TID  . heparin  5,000 Units Subcutaneous 3 times per day  . hydrALAZINE  100 mg Oral 3 times per day  . isosorbide mononitrate  60 mg Oral Daily  . metolazone  5 mg Oral Daily  . pantoprazole  40 mg Oral Daily  . polyethylene glycol  17 g Oral Daily  . sodium chloride  3 mL Intravenous Q12H  . vitamin B-12  1,000 mcg Oral Daily   Continuous Infusions:  PRN Meds:.sodium chloride, acetaminophen, albuterol, cyclobenzaprine, diazepam, ondansetron (ZOFRAN) IV, oxyCODONE, sodium chloride    PHYSICAL EXAM: Vital signs in last 24 hours: Filed Vitals:   09/16/14 0532 09/16/14 1004 09/16/14 1100 09/16/14 1342  BP: 154/63  153/84 134/65  Pulse: 65  62 56  Temp: 98.9 F (37.2 C)  97.6 F (36.4 C) 97.6 F (36.4 C)  TempSrc: Oral  Oral  Oral  Resp: 20  18 18   Height:      Weight: 79.843 kg (176 lb 0.4 oz)     SpO2: 100% 99% 99% 100%    Weight change:  Filed Weights   09/15/14 1246 09/15/14 1839 09/16/14 0532  Weight: 83.008 kg (183 lb) 79.969 kg  (176 lb 4.8 oz) 79.843 kg (176 lb 0.4 oz)   Body mass index is 29.29 kg/(m^2).   Gen Exam: Awake and alert with clear speech.  Neck: Supple, No JVD.   Chest: Few bibasilar CVS: S1 S2 Regular, no murmurs.  Abdomen: soft, BS +, non tender, non distended.  Extremities:+ edema, lower extremities warm to touch. Neurologic: Non Focal.   Skin: No Rash.   Wounds: N/A.   Intake/Output from previous day:  Intake/Output Summary (Last 24 hours) at 09/16/14 1453 Last data filed at 09/16/14 1341  Gross per 24 hour  Intake   1540 ml  Output    930 ml  Net    610 ml     LAB RESULTS: CBC  Recent Labs Lab 09/15/14 1430 09/16/14 0806  WBC 9.3 6.5  HGB 8.0* 7.7*  HCT 25.8* 24.9*  PLT 265 238  MCV 89.9 90.2  MCH 27.9 27.9  MCHC 31.0 30.9  RDW 24.7* 23.9*  LYMPHSABS 0.5* 0.6*  MONOABS 0.7 0.7  EOSABS 0.1 0.2  BASOSABS 0.2* 0.0    Chemistries   Recent Labs Lab 09/15/14 1430 09/16/14 0135 09/16/14 0806  NA 138 138 137  K 4.4 3.7 4.0  CL 104 103 102  CO2 24 23 24   GLUCOSE 92 130* 101*  BUN 29* 27* 26*  CREATININE 1.53* 1.84* 1.82*  CALCIUM 8.5* 8.4* 8.4*    CBG: No results for input(s): GLUCAP in the last 168 hours.  GFR Estimated Creatinine Clearance: 36.8 mL/min (by C-G formula based on Cr of 1.82).  Coagulation profile No results for input(s): INR, PROTIME in the last 168 hours.  Cardiac Enzymes  Recent Labs Lab 09/15/14 2023 09/16/14 0135 09/16/14 0806  TROPONINI 0.04* 0.04* 0.04*    Invalid input(s): POCBNP No results for input(s): DDIMER in the last 72 hours. No results for input(s): HGBA1C in the last 72 hours. No results for input(s): CHOL, HDL, LDLCALC, TRIG, CHOLHDL, LDLDIRECT in the last 72 hours. No results for input(s): TSH, T4TOTAL, T3FREE, THYROIDAB in the last 72 hours.  Invalid input(s): FREET3 No results for input(s): VITAMINB12, FOLATE, FERRITIN, TIBC, IRON, RETICCTPCT in the last 72 hours.  Recent Labs  09/15/14 2023  LIPASE 28      Urine Studies No results for input(s): UHGB, CRYS in the last 72 hours.  Invalid input(s): UACOL, UAPR, USPG, UPH, UTP, UGL, UKET, UBIL, UNIT, UROB, ULEU, UEPI, UWBC, URBC, UBAC, CAST, UCOM, BILUA  MICROBIOLOGY: No results found for this or any previous visit (from the past 240 hour(s)).  RADIOLOGY STUDIES/RESULTS: Dg Chest 2 View  09/15/2014   CLINICAL DATA:  69 year old male with a history of congestive heart failure. Four day history of lower extremity swelling.  EXAM: CHEST - 2 VIEW  COMPARISON:  09/13/2014, 08/29/2014, 08/27/2014  FINDINGS: Cardiomediastinal silhouette unchanged, with persisting cardiomegaly.  Atherosclerosis.  Surgical changes of median sternotomy and coronary artery bypass grafting.  Slight improvement in aeration from the comparison study, with improved lung volumes and decreasing prominence of interlobular septal thickening and interstitial opacities.  No evidence of pleural effusion.  No pneumothorax.  No displaced fracture.  Unremarkable appearance of the upper abdomen.  IMPRESSION: Improving aeration  with mild persisting pulmonary vascular congestion.  No pleural effusions or confluent airspace disease.  Cardiomegaly.  Signed,  Dulcy Fanny. Earleen Newport, DO  [facility]  [facility]   Electronically Signed   By: Corrie Mckusick D.O.   On: 09/15/2014 15:23   Dg Chest 2 View  08/29/2014   CLINICAL DATA:  Fluid build-up at the legs.  Initial encounter.  EXAM: CHEST  2 VIEW  COMPARISON:  Chest radiograph performed 08/27/2014  FINDINGS: The lungs are well-aerated. Vascular congestion is noted, with increased interstitial markings, concerning for mild interstitial edema. No pleural effusion or pneumothorax is seen.  The heart is enlarged. The patient is status post median sternotomy, with evidence of prior CABG. No acute osseous abnormalities are seen.  IMPRESSION: Vascular congestion and cardiomegaly, with increased interstitial markings,  concerning for mild interstitial edema.   Electronically Signed   By: Garald Balding M.D.   On: 08/29/2014 19:35   Dg Abd Portable 1v  09/15/2014   CLINICAL DATA:  Abdominal pain and distention.  Constipation.  EXAM: PORTABLE ABDOMEN - 1 VIEW  COMPARISON:  07/03/2014 and CT scan abdomen dated 07/22/2014  FINDINGS: Chronic cardiomegaly. Bowel gas pattern is normal. Vascular calcifications throughout the abdomen including the renal arteries. Slight lumbar scoliosis. No excessive stool in the colon.  IMPRESSION: Benign appearing abdomen.   Electronically Signed   By: Lorriane Shire M.D.   On: 09/15/2014 20:23    Oren Binet, MD  [facility] Pager:[phone]  If 7PM-7AM, please contact night-coverage [email or web address] Password [email or web address] 09/16/2014, 2:53 PM   LOS: 1 day

## 2014-09-16 NOTE — Care Management Note (Signed)
Case Management Note  Patient Details  Name: Charles [name] MRN: [id number] Date of Birth: 12-30-1945  Subjective/Objective:        Admitted with CHF            Action/Plan: Patient well known from previous admissions. Will continue to follow for DCP  Expected Discharge Date:     Possibly 09/21/2014             Expected Discharge Plan:  Home/Self Care  In-House Referral:  Clinical Social Work  Discharge planning Services  CM Consult  Status of Service:  In process, will continue to follow   Sherrilyn Rist [phone] 09/16/2014, 9:59 AM

## 2014-09-17 ENCOUNTER — Encounter (HOSPITAL_COMMUNITY): Payer: Self-pay | Admitting: General Practice

## 2014-09-17 DIAGNOSIS — G8929 Other chronic pain: Secondary | ICD-10-CM | POA: Insufficient documentation

## 2014-09-17 DIAGNOSIS — M79606 Pain in leg, unspecified: Secondary | ICD-10-CM

## 2014-09-17 DIAGNOSIS — R1084 Generalized abdominal pain: Secondary | ICD-10-CM | POA: Insufficient documentation

## 2014-09-17 LAB — BASIC METABOLIC PANEL
ANION GAP: 10 (ref 5–15)
BUN: 28 mg/dL — ABNORMAL HIGH (ref 6–20)
CALCIUM: 8.4 mg/dL — AB (ref 8.9–10.3)
CO2: 24 mmol/L (ref 22–32)
Chloride: 103 mmol/L (ref 101–111)
Creatinine, Ser: 1.85 mg/dL — ABNORMAL HIGH (ref 0.61–1.24)
GFR calc Af Amer: 41 mL/min — ABNORMAL LOW (ref >60)
GFR calc non Af Amer: 35 mL/min — ABNORMAL LOW (ref >60)
Glucose, Bld: 104 mg/dL — ABNORMAL HIGH (ref 65–99)
Potassium: 3.6 mmol/L (ref 3.5–5.1)
Sodium: 137 mmol/L (ref 135–145)

## 2014-09-17 LAB — CBC
HEMATOCRIT: 23.8 % — AB (ref 39.0–52.0)
Hemoglobin: 7.4 g/dL — ABNORMAL LOW (ref 13.0–17.0)
MCH: 27.4 pg (ref 26.0–34.0)
MCHC: 31.1 g/dL (ref 30.0–36.0)
MCV: 88.1 fL (ref 78.0–100.0)
Platelets: 227 10*3/uL (ref 150–400)
RBC: 2.7 MIL/uL — AB (ref 4.22–5.81)
RDW: 24 % — AB (ref 11.5–15.5)
WBC: 5.9 10*3/uL (ref 4.0–10.5)

## 2014-09-17 MED ORDER — HEPARIN SODIUM (PORCINE) 5000 UNIT/ML IJ SOLN
5000.0000 [IU] | Freq: Three times a day (TID) | INTRAMUSCULAR | Status: DC
  Filled 2014-09-17 (×4): qty 1

## 2014-09-17 MED ORDER — TORSEMIDE 20 MG PO TABS
40.0000 mg | ORAL_TABLET | Freq: Two times a day (BID) | ORAL | Status: DC
  Administered 2014-09-17 – 2014-09-18 (×2): 40 mg via ORAL
  Filled 2014-09-17 (×4): qty 2

## 2014-09-17 MED ORDER — AMLODIPINE BESYLATE 10 MG PO TABS
10.0000 mg | ORAL_TABLET | Freq: Every day | ORAL | Status: DC
  Administered 2014-09-17 – 2014-09-18 (×2): 10 mg via ORAL
  Filled 2014-09-17 (×2): qty 1

## 2014-09-17 MED ORDER — ASPIRIN EC 81 MG PO TBEC
81.0000 mg | DELAYED_RELEASE_TABLET | Freq: Every day | ORAL | Status: DC
  Administered 2014-09-17 – 2014-09-18 (×2): 81 mg via ORAL
  Filled 2014-09-17 (×2): qty 1

## 2014-09-17 NOTE — Progress Notes (Signed)
Patient ID: Charles [name], male   DOB: Mar 25, 1945, 69 y.o.   MRN: [id number] Advanced Heart Failure Rounding Note   Subjective:    Diuresing with IV lasix + metolazone. Diuresed 1.5 liters. Weight down 2 pounds.   Denies SOB     Objective:   Weight Range:  Vital Signs:   Temp:  [97.6 F (36.4 C)-98 F (36.7 C)] 97.9 F (36.6 C) (06/30 0515) Pulse Rate:  [56-64] 64 (06/30 0515) Resp:  [18] 18 (06/30 0515) BP: (110-153)/(56-84) 150/69 mmHg (06/30 0515) SpO2:  [94 %-100 %] 95 % (06/30 0843) Weight:  [174 lb 9.6 oz (79.198 kg)] 174 lb 9.6 oz (79.198 kg) (06/30 0515)    Weight change: Filed Weights   09/15/14 1839 09/16/14 0532 09/17/14 0515  Weight: 176 lb 4.8 oz (79.969 kg) 176 lb 0.4 oz (79.843 kg) 174 lb 9.6 oz (79.198 kg)    Intake/Output:   Intake/Output Summary (Last 24 hours) at 09/17/14 1032 Last data filed at 09/17/14 1002  Gross per 24 hour  Intake   1360 ml  Output   3300 ml  Net  -1940 ml     Physical Exam: General: Chronically ill appearing. No resp difficulty. Sitting on the side of the bed.  HEENT: normal Neck: supple. JVP ~10. Carotids 2+ bilat; no bruits. No lymphadenopathy or thryomegaly appreciated. Cor: PMI nondisplaced. Regular rate & rhythm. No rubs, gallops or murmurs. Lungs: clear Abdomen: soft, nontender, nondistended. No hepatosplenomegaly. No bruits or masses. Good bowel sounds. Extremities: no cyanosis, clubbing, rash, R and LLE 1-+ edema. RLE police ankle monitor Neuro: alert & orientedx3, cranial nerves grossly intact. moves all 4 extremities w/o difficulty. Affect pleasant  Telemetry: Sinus Brady 50s   Labs: Basic Metabolic Panel:  Recent Labs Lab 09/15/14 1430 09/16/14 0135 09/16/14 0806 09/17/14 0430  NA 138 138 137 137  K 4.4 3.7 4.0 3.6  CL 104 103 102 103  CO2 24 23 24 24   GLUCOSE 92 130* 101* 104*  BUN 29* 27* 26* 28*  CREATININE 1.53* 1.84* 1.82* 1.85*  CALCIUM 8.5* 8.4* 8.4* 8.4*    Liver Function  Tests:  Recent Labs Lab 09/15/14 2023 09/16/14 0806  AST 50* 39  ALT 34 28  ALKPHOS 70 86  BILITOT 1.2 1.0  PROT 6.1* 6.1*  ALBUMIN 2.9* 2.8*    Recent Labs Lab 09/15/14 2023  LIPASE 28   No results for input(s): AMMONIA in the last 168 hours.  CBC:  Recent Labs Lab 09/15/14 1430 09/16/14 0806 09/17/14 0430  WBC 9.3 6.5 5.9  NEUTROABS 7.8* 5.0  --   HGB 8.0* 7.7* 7.4*  HCT 25.8* 24.9* 23.8*  MCV 89.9 90.2 88.1  PLT 265 238 227    Cardiac Enzymes:  Recent Labs Lab 09/15/14 1430 09/15/14 2023 09/16/14 0135 09/16/14 0806  CKTOTAL  --   --  35*  --   TROPONINI 0.05* 0.04* 0.04* 0.04*    BNP: BNP (last 3 results)  Recent Labs  09/14/14 1123 09/14/14 1212 09/15/14 1430  BNP 3315.0* 3617.4* 3756.9*    ProBNP (last 3 results)  Recent Labs  01/01/14 1107 02/07/14 0123 03/01/14 2250  PROBNP 3225.0* 2648.0* 7421.0*      Other results:  Imaging: Dg Chest 2 View  09/15/2014   CLINICAL DATA:  69 year old male with a history of congestive heart failure. Four day history of lower extremity swelling.  EXAM: CHEST - 2 VIEW  COMPARISON:  09/13/2014, 08/29/2014, 08/27/2014  FINDINGS: Cardiomediastinal silhouette unchanged, with persisting  cardiomegaly.  Atherosclerosis.  Surgical changes of median sternotomy and coronary artery bypass grafting.  Slight improvement in aeration from the comparison study, with improved lung volumes and decreasing prominence of interlobular septal thickening and interstitial opacities.  No evidence of pleural effusion.  No pneumothorax.  No displaced fracture.  Unremarkable appearance of the upper abdomen.  IMPRESSION: Improving aeration with mild persisting pulmonary vascular congestion.  No pleural effusions or confluent airspace disease.  Cardiomegaly.  Signed,  Dulcy Fanny. Earleen Newport, DO  [facility]  [facility]   Electronically Signed   By: Corrie Mckusick D.O.   On: 09/15/2014 15:23    US Abdomen Limited  09/16/2014   CLINICAL DATA:  Abdominal pain.  History of cirrhosis.  EXAM: LIMITED ABDOMEN ULTRASOUND FOR ASCITES  TECHNIQUE: Limited ultrasound survey for ascites was performed in all four abdominal quadrants.  COMPARISON:  CT 07/22/2014.  Ultrasound 08/01/2014.  FINDINGS: No significant ascites identified on sonographic survey of 4 abdominal quadrants. Echogenic liver noted.  IMPRESSION: No evidence of recurrent ascites.   Electronically Signed   By: Richardean Sale M.D.   On: 09/16/2014 20:33   Dg Abd Portable 1v  09/15/2014   CLINICAL DATA:  Abdominal pain and distention.  Constipation.  EXAM: PORTABLE ABDOMEN - 1 VIEW  COMPARISON:  07/03/2014 and CT scan abdomen dated 07/22/2014  FINDINGS: Chronic cardiomegaly. Bowel gas pattern is normal. Vascular calcifications throughout the abdomen including the renal arteries. Slight lumbar scoliosis. No excessive stool in the colon.  IMPRESSION: Benign appearing abdomen.   Electronically Signed   By: Lorriane Shire M.D.   On: 09/15/2014 20:23      Medications:     Scheduled Medications: . amLODipine  5 mg Oral Daily  . aspirin EC  325 mg Oral Daily  . budesonide-formoterol  2 puff Inhalation BID  . citalopram  20 mg Oral Daily  . cloNIDine  0.3 mg Oral BID  . divalproex  500 mg Oral BID  . ferrous sulfate  325 mg Oral BID WC  . folic acid  1 mg Oral Daily  . furosemide  80 mg Intravenous TID  . heparin  5,000 Units Subcutaneous 3 times per day  . hydrALAZINE  100 mg Oral 3 times per day  . isosorbide mononitrate  60 mg Oral Daily  . metolazone  5 mg Oral Daily  . pantoprazole  40 mg Oral Daily  . polyethylene glycol  17 g Oral BID  . sodium chloride  3 mL Intravenous Q12H  . vitamin B-12  1,000 mcg Oral Daily     Infusions:     PRN Medications:  sodium chloride, acetaminophen, albuterol, cyclobenzaprine, diazepam, morphine injection, ondansetron (ZOFRAN) IV, oxyCODONE, sodium chloride   Assessment:  1.  Acute/Chronic Diastolic Heart Failure ICM EF 05/2014 50% Grade II DD.  2. Cocaine Abuse- trying to quit last positive 08/30/2014  3. HTN-with hypertensive heart disease 4. Anemia- H/O AVMS  5. CAD CABG x s3 2009 on 81 mg aspirin daily, and simvastatin 40 mg daily.  6. PAD - schedule f/u with [facility].    Plan/Discussion:    Volume status improving. Weight down 9 pounds. Continue IV lasix one more day + metolazone. Renal function ok.   BP elevated. Increase amlodipine to 10 mg daily.    Hgb trending down to 7.4? No evidence of bleeding. Has history of AVMs. Does not full dose aspirin. Cut back to 81 mg daily. Check stool.    Length of Stay: 2  CLEGG,AMY 09/17/2014, 10:32 AM  Advanced Heart Failure Team Pager [phone] (M-F; 7a - 4p)  Please contact [facility] for night-coverage after hours (4p -7a ) and weekends on [email or web address]  Patient seen and examined with Darrick Grinder, NP. We discussed all aspects of the encounter. I agree with the assessment and plan as stated above.   Volume status much improved. Close to euvolemia. Will switch to po demadex today and see if we can keep fluid off. Agree with increasing norvasc for HTN. W/u of drop in Hgb per IM team. Will cut ASA to 81.   Bensimhon, Daniel,MD 12:56 PM

## 2014-09-17 NOTE — Clinical Documentation Improvement (Signed)
  Patient admitted with acute on chronic diastolic heart failure.  CXR show cardiomegaly. Grade 2 diastolic dysfunction in 41/2820 by echo.  Known history of Hypertension and Stage III CKD.  Please document if a condition below provides greater specificity regarding the findings noted above:   - Hypertensive Heart Disease  - Other condition  - Unable to clinically determine    Thank You, Erling Conte ,RN Clinical Documentation Specialist:  [facility]

## 2014-09-17 NOTE — Progress Notes (Signed)
Pt walked with PT earlier. Attempted to discuss managing HF with pt. He was resistant to admit he had a role, stating "I have congestive heart failure, what am I supposed to do?!" Pt stated he doesn't eat salt and it doesn't help to elevate his legs. However when I showed him the high to very low sodium diet, he was surprised how much sodium was in certain foods. Encouraged pt to read sheet and we will f/u tomorrow. Left materials. [phone] [place], ACSM 12:07 PM  09/17/2014

## 2014-09-17 NOTE — Progress Notes (Signed)
Pt requesting his MD  to send fax to his lawyer to let him know he is in the hop.  Message sent to Dr. Sloan Leiter of what pt had written to be faxed.  Pt made aware that MD notified.  Karie Kirks, Therapist, sports.

## 2014-09-17 NOTE — Evaluation (Signed)
Physical Therapy Evaluation Patient Details Name: Shulem Mader MRN: [id number] DOB: [birth year] Today's Date: 09/17/2014   History of Present Illness  Jakyrie Totherow is a 69 y.o. male with Past medical history of chronic combined systolic and diastolic CHF, noncompliance with medical regimen, chronic cocaine abuse, iron deficiency anemia, severe peripheral vascular disease, history of AVM, history of seizures, chronic kidney disease, essential hypertension. Admitted with SOB and abdominal distension  Clinical Impression  Mr.Cirelli familiar from prior admissions and moving well with limited speed. Pt reports SOB and bil LE pain prevent him from mobilizing much at home. Educated for walking program and benefit of ambulation for CHF and PVD however pt does not demonstrate acceptance of this education through willingness to change habits, he does verbalize understanding. Pt educated for leg exercises with walking highly encouraged pt verbalized understanding. No further needs at this time and will sign off, pt aware and agreeable.     Follow Up Recommendations No PT follow up    Equipment Recommendations  Rolling walker with 5" wheels (recommend RW to assist with fall prevention however pt states he won't use and wants a cane)    Recommendations for Other Services       Precautions / Restrictions Precautions Precautions: Fall      Mobility  Bed Mobility Overal bed mobility: Modified Independent                Transfers Overall transfer level: Modified independent                  Ambulation/Gait Ambulation/Gait assistance: Supervision Ambulation Distance (Feet): 500 Feet Assistive device: Rolling walker (2 wheeled) Gait Pattern/deviations: Step-through pattern;Decreased stride length   Gait velocity interpretation: Below normal speed for age/gender General Gait Details: pt with slow controlled gait with RW with report of multiple falls in the past few  months. Pt states bil LE pain limits him, very slow gait with frequent stops to talk  Stairs            Wheelchair Mobility    Modified Rankin (Stroke Patients Only)       Balance Overall balance assessment: History of Falls                                           Pertinent Vitals/Pain Pain Assessment: 0-10 Pain Score: 4  Pain Location: bil LE cramping with gait Pain Descriptors / Indicators: Cramping Pain Intervention(s): Limited activity within patient's tolerance;Premedicated before session;Repositioned    Home Living Family/patient expects to be discharged to:: Private residence Living Arrangements: Children;Other relatives Available Help at Discharge: Family;Available 24 hours/day Type of Home: House Home Access: Stairs to enter Entrance Stairs-Rails: Right;Left;Can reach both Entrance Stairs-Number of Steps: 4 Home Layout: One level Home Equipment: None      Prior Function Level of Independence: Independent         Comments: No longer drives, 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, newspaper   Dominant Hand: Right    Extremity/Trunk Assessment   Upper Extremity Assessment: Overall WFL for tasks assessed           Lower Extremity Assessment: Generalized weakness      Cervical / Trunk Assessment: Normal  Communication   Communication: No difficulties  Cognition Arousal/Alertness: Awake/alert Behavior During Therapy: WFL for tasks assessed/performed Overall Cognitive Status: Within Functional Limits for tasks assessed  General Comments      Exercises        Assessment/Plan    PT Assessment Patent does not need any further PT services  PT Diagnosis Difficulty walking   PT Problem List    PT Treatment Interventions     PT Goals (Current goals can be found in the Care Plan section) Acute Rehab PT Goals PT Goal Formulation: All assessment and education complete, DC therapy    Frequency      Barriers to discharge        Co-evaluation               End of Session   Activity Tolerance: Patient tolerated treatment well Patient left: in chair;with call bell/phone within reach Nurse Communication: Mobility status         Time: 8891-6945 PT Time Calculation (min) (ACUTE ONLY): 32 min   Charges:   PT Evaluation $Initial PT Evaluation Tier I: 1 Procedure PT Treatments $Self Care/Home Management: 8-22   PT G CodesMelford Aase 09/17/2014, 10:49 AM Elwyn Reach, [place]

## 2014-09-17 NOTE — Progress Notes (Signed)
PATIENT DETAILS Name: Charles [name] Age: 69 y.o. Sex: male Date of Birth: 30-Nov-1945 Admit Date: 09/15/2014 Admitting Physician Verlee Monte, MD PCP:No PCP Per Patient  Subjective: Breathing somewhat better, continues to have unchanged bilateral lower extremity pain. Complains of abdominal bloating and tenderness. States he wants to go home.  Assessment/Plan: Principal Problem: Acute on chronic diastolic CHF ((EF 16-10% 9/60): Recurrent numerous admissions, suspect secondary to noncompliance, poor insight and ongoing cocaine use. Cardiology consulted,Continue diuretics. Weight decreased to 174 lbs (176 lb on admission). Continue to  Monitor intake/output, daily weights. Follow lytes closely has stage III chronic kidney disease. Counseled on diet.  Active Problems: Abdominal discomfort: Resolved. Suspect mostly secondary to hepatic congestion from above. Abdominal ultrasound showed no evidence of recurrent ascites. X-ray abdomen on admission negative for acute abnormalities. Supportive care and follow.  History of peripheral vascular disease: Likely etiology for bilateral foot pain. Recent ABI's showed severe peripheral vascular disease, right 0.42 and left 0.22. Patient underwent arteriogram on 6/20, recommendations were for aggressive conservative medical management, tobacco cessation. If symptoms progressed then a right-to-left femorofemoral femoropopliteal bypass at some point. Continue aspirin  Stage III chronic kidney disease: Creatinine remains close to usual baseline. Follow closely while on Lasix.  History of CAD: Status post CABG, continue aspirin. Avoid beta blockers given ongoing cocaine use.  History of? Cirrhosis of the liver: Noted radiographically on CT scan and also on ultrasound. Unclear etiology, but could have cardiac cirrhosis.  Hypertension: Controlled, continue amlodipine, clonidine, hydralazine and Imdur. Follow and titrate  accordingly  Anemia: Suspect related to anemia of chronic disease/kidney failure.Slowly decreasing-no indication of overt GI loss (brown stools yesterday).  Follow and transfuse if hemoglobin if it drops further. Type and screen  Cocaine abuse: Claims last use 2 weeks ago. Counseling done again.  Suspected noncompliance to medications: Causing repeated Hospitalization. Counseled.  History of COPD: Currently stable. Continue Symbicort and nebs.  Disposition: Remain inpatient-home in 1-2 days  Antimicrobial agents  See below  Anti-infectives    None      DVT Prophylaxis: Prophylactic  Heparin   Code Status: Full code   Family Communication None at bedside  Procedures: None  CONSULTS:  cardiology  Time spent 25 minutes-Greater than 50% of this time was spent in counseling, explanation of diagnosis, planning of further management, and coordination of care.  MEDICATIONS: Scheduled Meds: . amLODipine  10 mg Oral Daily  . aspirin EC  81 mg Oral Daily  . budesonide-formoterol  2 puff Inhalation BID  . citalopram  20 mg Oral Daily  . cloNIDine  0.3 mg Oral BID  . divalproex  500 mg Oral BID  . ferrous sulfate  325 mg Oral BID WC  . folic acid  1 mg Oral Daily  . furosemide  80 mg Intravenous TID  . hydrALAZINE  100 mg Oral 3 times per day  . isosorbide mononitrate  60 mg Oral Daily  . metolazone  5 mg Oral Daily  . pantoprazole  40 mg Oral Daily  . polyethylene glycol  17 g Oral BID  . sodium chloride  3 mL Intravenous Q12H  . vitamin B-12  1,000 mcg Oral Daily   Continuous Infusions:  PRN Meds:.sodium chloride, acetaminophen, albuterol, cyclobenzaprine, diazepam, morphine injection, ondansetron (ZOFRAN) IV, oxyCODONE, sodium chloride    PHYSICAL EXAM: Vital signs in last 24 hours: Filed Vitals:   09/17/14 0041 09/17/14 0515 09/17/14 4540 09/17/14 1046  BP: 132/62 150/69    Pulse: 57 64  57  Temp: 98 F (36.7 C) 97.9 F (36.6 C)    TempSrc: Oral Oral     Resp: 18 18    Height:      Weight:  79.198 kg (174 lb 9.6 oz)    SpO2: 94% 96% 95%     Weight change: -3.81 kg (-8 lb 6.4 oz) Filed Weights   09/15/14 1839 09/16/14 0532 09/17/14 0515  Weight: 79.969 kg (176 lb 4.8 oz) 79.843 kg (176 lb 0.4 oz) 79.198 kg (174 lb 9.6 oz)   Body mass index is 29.05 kg/(m^2).   Gen Exam: Awake and alert with clear speech.  Neck: Supple, No JVD.   Chest: Few bibasilar CVS: S1 S2 Regular, no murmurs.  Abdomen: soft, BS +, non tender, non distended.  Extremities:2+ edema, lower extremities warm to touch. Neurologic: Non Focal.   Skin: No Rash.   Wounds: N/A.   Intake/Output from previous day:  Intake/Output Summary (Last 24 hours) at 09/17/14 1052 Last data filed at 09/17/14 1002  Gross per 24 hour  Intake   1680 ml  Output   3300 ml  Net  -1620 ml     LAB RESULTS: CBC  Recent Labs Lab 09/15/14 1430 09/16/14 0806 09/17/14 0430  WBC 9.3 6.5 5.9  HGB 8.0* 7.7* 7.4*  HCT 25.8* 24.9* 23.8*  PLT 265 238 227  MCV 89.9 90.2 88.1  MCH 27.9 27.9 27.4  MCHC 31.0 30.9 31.1  RDW 24.7* 23.9* 24.0*  LYMPHSABS 0.5* 0.6*  --   MONOABS 0.7 0.7  --   EOSABS 0.1 0.2  --   BASOSABS 0.2* 0.0  --     Chemistries   Recent Labs Lab 09/15/14 1430 09/16/14 0135 09/16/14 0806 09/17/14 0430  NA 138 138 137 137  K 4.4 3.7 4.0 3.6  CL 104 103 102 103  CO2 24 23 24 24   GLUCOSE 92 130* 101* 104*  BUN 29* 27* 26* 28*  CREATININE 1.53* 1.84* 1.82* 1.85*  CALCIUM 8.5* 8.4* 8.4* 8.4*    CBG: No results for input(s): GLUCAP in the last 168 hours.  GFR Estimated Creatinine Clearance: 36.1 mL/min (by C-G formula based on Cr of 1.85).  Coagulation profile No results for input(s): INR, PROTIME in the last 168 hours.  Cardiac Enzymes  Recent Labs Lab 09/15/14 2023 09/16/14 0135 09/16/14 0806  TROPONINI 0.04* 0.04* 0.04*    Invalid input(s): POCBNP No results for input(s): DDIMER in the last 72 hours. No results for input(s): HGBA1C  in the last 72 hours. No results for input(s): CHOL, HDL, LDLCALC, TRIG, CHOLHDL, LDLDIRECT in the last 72 hours. No results for input(s): TSH, T4TOTAL, T3FREE, THYROIDAB in the last 72 hours.  Invalid input(s): FREET3 No results for input(s): VITAMINB12, FOLATE, FERRITIN, TIBC, IRON, RETICCTPCT in the last 72 hours.  Recent Labs  09/15/14 2023  LIPASE 28    Urine Studies No results for input(s): UHGB, CRYS in the last 72 hours.  Invalid input(s): UACOL, UAPR, USPG, UPH, UTP, UGL, UKET, UBIL, UNIT, UROB, ULEU, UEPI, UWBC, URBC, UBAC, CAST, UCOM, BILUA  MICROBIOLOGY: No results found for this or any previous visit (from the past 240 hour(s)).  RADIOLOGY STUDIES/RESULTS: Dg Chest 2 View  09/15/2014   CLINICAL DATA:  69 year old male with a history of congestive heart failure. Four day history of lower extremity swelling.  EXAM: CHEST - 2 VIEW  COMPARISON:  09/13/2014, 08/29/2014, 08/27/2014  FINDINGS: Cardiomediastinal silhouette unchanged,  with persisting cardiomegaly.  Atherosclerosis.  Surgical changes of median sternotomy and coronary artery bypass grafting.  Slight improvement in aeration from the comparison study, with improved lung volumes and decreasing prominence of interlobular septal thickening and interstitial opacities.  No evidence of pleural effusion.  No pneumothorax.  No displaced fracture.  Unremarkable appearance of the upper abdomen.  IMPRESSION: Improving aeration with mild persisting pulmonary vascular congestion.  No pleural effusions or confluent airspace disease.  Cardiomegaly.  Signed,  Dulcy Fanny. Earleen Newport, DO  [facility]  [facility]   Electronically Signed   By: Corrie Mckusick D.O.   On: 09/15/2014 15:23   Dg Chest 2 View  08/29/2014   CLINICAL DATA:  Fluid build-up at the legs.  Initial encounter.  EXAM: CHEST  2 VIEW  COMPARISON:  Chest radiograph performed 08/27/2014  FINDINGS: The lungs are well-aerated. Vascular  congestion is noted, with increased interstitial markings, concerning for mild interstitial edema. No pleural effusion or pneumothorax is seen.  The heart is enlarged. The patient is status post median sternotomy, with evidence of prior CABG. No acute osseous abnormalities are seen.  IMPRESSION: Vascular congestion and cardiomegaly, with increased interstitial markings, concerning for mild interstitial edema.   Electronically Signed   By: Garald Balding M.D.   On: 08/29/2014 19:35   US Abdomen Limited  09/16/2014   CLINICAL DATA:  Abdominal pain.  History of cirrhosis.  EXAM: LIMITED ABDOMEN ULTRASOUND FOR ASCITES  TECHNIQUE: Limited ultrasound survey for ascites was performed in all four abdominal quadrants.  COMPARISON:  CT 07/22/2014.  Ultrasound 08/01/2014.  FINDINGS: No significant ascites identified on sonographic survey of 4 abdominal quadrants. Echogenic liver noted.  IMPRESSION: No evidence of recurrent ascites.   Electronically Signed   By: Richardean Sale M.D.   On: 09/16/2014 20:33   Dg Abd Portable 1v  09/15/2014   CLINICAL DATA:  Abdominal pain and distention.  Constipation.  EXAM: PORTABLE ABDOMEN - 1 VIEW  COMPARISON:  07/03/2014 and CT scan abdomen dated 07/22/2014  FINDINGS: Chronic cardiomegaly. Bowel gas pattern is normal. Vascular calcifications throughout the abdomen including the renal arteries. Slight lumbar scoliosis. No excessive stool in the colon.  IMPRESSION: Benign appearing abdomen.   Electronically Signed   By: Lorriane Shire M.D.   On: 09/15/2014 20:23    Eula Flax, PA-S  [facility] Pager:[phone]  If 7PM-7AM, please contact night-coverage [email or web address] Password [email or web address] 09/17/2014, 10:52 AM   LOS: 2 days

## 2014-09-17 NOTE — Progress Notes (Signed)
Informed by MD that SW to do paper work for court date that pt had requested for.  SW paged and massage left on answering machine at 1220.  Paged  SW again at 1242,  spoke with [name] informing her of pt's request and that he need paper work in to his lawyer by 1pm.  Instructed she will be up to see pt.  Karie Kirks, Therapist, sports.

## 2014-09-18 LAB — CBC
HEMATOCRIT: 26 % — AB (ref 39.0–52.0)
HEMOGLOBIN: 8 g/dL — AB (ref 13.0–17.0)
MCH: 27.4 pg (ref 26.0–34.0)
MCHC: 30.8 g/dL (ref 30.0–36.0)
MCV: 89 fL (ref 78.0–100.0)
PLATELETS: 270 10*3/uL (ref 150–400)
RBC: 2.92 MIL/uL — AB (ref 4.22–5.81)
RDW: 24.1 % — AB (ref 11.5–15.5)
WBC: 5.7 10*3/uL (ref 4.0–10.5)

## 2014-09-18 LAB — BASIC METABOLIC PANEL
ANION GAP: 11 (ref 5–15)
BUN: 31 mg/dL — AB (ref 6–20)
CO2: 30 mmol/L (ref 22–32)
CREATININE: 1.69 mg/dL — AB (ref 0.61–1.24)
Calcium: 9 mg/dL (ref 8.9–10.3)
Chloride: 94 mmol/L — ABNORMAL LOW (ref 101–111)
GFR calc Af Amer: 46 mL/min — ABNORMAL LOW (ref >60)
GFR, EST NON AFRICAN AMERICAN: 39 mL/min — AB (ref >60)
Glucose, Bld: 93 mg/dL (ref 65–99)
Potassium: 3.5 mmol/L (ref 3.5–5.1)
Sodium: 135 mmol/L (ref 135–145)

## 2014-09-18 MED ORDER — FERROUS SULFATE 325 (65 FE) MG PO TABS
325.0000 mg | ORAL_TABLET | Freq: Two times a day (BID) | ORAL | Status: DC
  Administered 2014-09-18: 325 mg via ORAL
  Filled 2014-09-18 (×3): qty 1

## 2014-09-18 MED ORDER — TORSEMIDE 20 MG PO TABS: 40.0000 mg | ORAL_TABLET | Freq: Two times a day (BID) | ORAL | Status: DC

## 2014-09-18 MED ORDER — ASPIRIN EC 81 MG PO TBEC: 81.0000 mg | DELAYED_RELEASE_TABLET | Freq: Every day | ORAL | Status: DC

## 2014-09-18 MED ORDER — POTASSIUM CHLORIDE CRYS ER 20 MEQ PO TBCR
40.0000 meq | EXTENDED_RELEASE_TABLET | Freq: Once | ORAL | Status: AC
  Administered 2014-09-18: 40 meq via ORAL
  Filled 2014-09-18: qty 2

## 2014-09-18 MED ORDER — AMLODIPINE BESYLATE 10 MG PO TABS: 10.0000 mg | ORAL_TABLET | Freq: Every day | ORAL | Status: AC

## 2014-09-18 NOTE — Progress Notes (Signed)
Pt d/c of floor at ll33 to home via w/c.  Escorted by his daughter.  Karie Kirks, Therapist, sports.

## 2014-09-18 NOTE — Progress Notes (Signed)
Patient ID: Charles [name], male   DOB: November 22, 1945, 69 y.o.   MRN: [id number] Advanced Heart Failure Rounding Note   Subjective:    Yesterday he transitioned from IV lasix to torsemide.  Weight down 7 pounds.   Denies SOB . Wants to go home.     Objective:   Weight Range:  Vital Signs:   Temp:  [97.6 F (36.4 C)-98.1 F (36.7 C)] 97.8 F (36.6 C) (07/01 0629) Pulse Rate:  [51-63] 54 (07/01 0802) Resp:  [18-20] 20 (07/01 0629) BP: (113-169)/(52-95) 129/67 mmHg (07/01 0802) SpO2:  [94 %-100 %] 100 % (07/01 0629) Weight:  [167 lb 14.4 oz (76.159 kg)] 167 lb 14.4 oz (76.159 kg) (07/01 0629) Last BM Date: 09/17/14  Weight change: Filed Weights   09/16/14 0532 09/17/14 0515 09/18/14 0629  Weight: 176 lb 0.4 oz (79.843 kg) 174 lb 9.6 oz (79.198 kg) 167 lb 14.4 oz (76.159 kg)    Intake/Output:   Intake/Output Summary (Last 24 hours) at 09/18/14 0950 Last data filed at 09/18/14 0923  Gross per 24 hour  Intake   2340 ml  Output 3600.5 ml  Net -1260.5 ml     Physical Exam: General: Chronically ill appearing. No resp difficulty. Standing in room.   HEENT: normal Neck: supple. JVP 8-9. Carotids 2+ bilat; no bruits. No lymphadenopathy or thryomegaly appreciated. Cor: PMI nondisplaced. Regular rate & rhythm. No rubs, gallops or murmurs. Lungs: clear Abdomen: soft, nontender, nondistended. No hepatosplenomegaly. No bruits or masses. Good bowel sounds. Extremities: no cyanosis, clubbing, rash, R and LLE 1+ edema. RLE police ankle monitor Neuro: alert & orientedx3, cranial nerves grossly intact. moves all 4 extremities w/o difficulty. Affect pleasant  Telemetry: Sinus Brady 50s   Labs: Basic Metabolic Panel:  Recent Labs Lab 09/15/14 1430 09/16/14 0135 09/16/14 0806 09/17/14 0430 09/18/14 0406  NA 138 138 137 137 135  K 4.4 3.7 4.0 3.6 3.5  CL 104 103 102 103 94*  CO2 24 23 24 24 30   GLUCOSE 92 130* 101* 104* 93  BUN 29* 27* 26* 28* 31*  CREATININE 1.53*  1.84* 1.82* 1.85* 1.69*  CALCIUM 8.5* 8.4* 8.4* 8.4* 9.0    Liver Function Tests:  Recent Labs Lab 09/15/14 2023 09/16/14 0806  AST 50* 39  ALT 34 28  ALKPHOS 70 86  BILITOT 1.2 1.0  PROT 6.1* 6.1*  ALBUMIN 2.9* 2.8*    Recent Labs Lab 09/15/14 2023  LIPASE 28   No results for input(s): AMMONIA in the last 168 hours.  CBC:  Recent Labs Lab 09/15/14 1430 09/16/14 0806 09/17/14 0430 09/18/14 0406  WBC 9.3 6.5 5.9 5.7  NEUTROABS 7.8* 5.0  --   --   HGB 8.0* 7.7* 7.4* 8.0*  HCT 25.8* 24.9* 23.8* 26.0*  MCV 89.9 90.2 88.1 89.0  PLT 265 238 227 270    Cardiac Enzymes:  Recent Labs Lab 09/15/14 1430 09/15/14 2023 09/16/14 0135 09/16/14 0806  CKTOTAL  --   --  35*  --   TROPONINI 0.05* 0.04* 0.04* 0.04*    BNP: BNP (last 3 results)  Recent Labs  09/14/14 1123 09/14/14 1212 09/15/14 1430  BNP 3315.0* 3617.4* 3756.9*    ProBNP (last 3 results)  Recent Labs  01/01/14 1107 02/07/14 0123 03/01/14 2250  PROBNP 3225.0* 2648.0* 7421.0*      Other results:  Imaging: US Abdomen Limited  09/16/2014   CLINICAL DATA:  Abdominal pain.  History of cirrhosis.  EXAM: LIMITED ABDOMEN ULTRASOUND FOR ASCITES  TECHNIQUE:  Limited ultrasound survey for ascites was performed in all four abdominal quadrants.  COMPARISON:  CT 07/22/2014.  Ultrasound 08/01/2014.  FINDINGS: No significant ascites identified on sonographic survey of 4 abdominal quadrants. Echogenic liver noted.  IMPRESSION: No evidence of recurrent ascites.   Electronically Signed   By: Richardean Sale M.D.   On: 09/16/2014 20:33     Medications:     Scheduled Medications: . amLODipine  10 mg Oral Daily  . aspirin EC  81 mg Oral Daily  . budesonide-formoterol  2 puff Inhalation BID  . citalopram  20 mg Oral Daily  . cloNIDine  0.3 mg Oral BID  . divalproex  500 mg Oral BID  . ferrous sulfate  325 mg Oral BID WC  . folic acid  1 mg Oral Daily  . heparin subcutaneous  5,000 Units Subcutaneous  3 times per day  . hydrALAZINE  100 mg Oral 3 times per day  . isosorbide mononitrate  60 mg Oral Daily  . metolazone  5 mg Oral Daily  . pantoprazole  40 mg Oral Daily  . polyethylene glycol  17 g Oral BID  . sodium chloride  3 mL Intravenous Q12H  . torsemide  40 mg Oral BID  . vitamin B-12  1,000 mcg Oral Daily    Infusions:    PRN Medications: sodium chloride, acetaminophen, albuterol, cyclobenzaprine, diazepam, morphine injection, ondansetron (ZOFRAN) IV, oxyCODONE, sodium chloride   Assessment:  1. Acute/Chronic Diastolic Heart Failure ICM EF 05/2014 50% Grade II DD.  2. Cocaine Abuse- trying to quit last positive 08/30/2014  3. HTN-with hypertensive heart disease 4. Anemia- H/O AVMS  5. CAD CABG x s3 2009 on 81 mg aspirin daily, and simvastatin 40 mg daily.  6. PAD - schedule f/u with [facility].    Plan/Discussion:    Volume status improving. Weight down 16 pounds. Continue torsemide 40 mg twice a day.  Renal function ok. Could go home today.   BP improved. Continue current meds. .    Hgb better today 7.4>8.0 No evidence of bleeding. Has history of AVMs. Does not full dose aspirin. Cut back to 81 mg daily. Check stool.   HF follow up scheduled Torsemide 40 mg twice a day  Amlodipine 10 mg daily  Hydralazine 100 mg three times a ay Imdur 60 mg daily Clonidine 0.3 twice a day   Length of Stay: 3   CLEGG,AMY NP-C 09/18/2014, 9:50 AM  Advanced Heart Failure Team Pager [phone] (M-F; 7a - 4p)  Please contact [facility] for night-coverage after hours (4p -7a ) and weekends on [email or web address]  Patient seen and examined with Darrick Grinder, NP. We discussed all aspects of the encounter. I agree with the assessment and plan as stated above.   Volume status much improved. He is adamant about going home today. Keeping him decongested as an outpatient will be major challenge. Suspect he will need higher doses of diuretics down the road.   Odessa Morren,MD 6:38  PM

## 2014-09-18 NOTE — Discharge Summary (Signed)
PATIENT DETAILS Name: Charles [name] Age: 69 y.o. Sex: male Date of Birth: 03-29-1945 MRN: [id number]. Admitting Physician: Verlee Monte, MD PCP:No PCP Per Patient  Admit Date: 09/15/2014 Discharge date: 09/18/2014  Recommendations for Outpatient Follow-up:  1. Needs ongoing counseling regarding compliance with medication, stopping cocaine use.  2. Please refer to gastroenterology-? Cirrhosis. 3. Please monitor electrolytes closely while on diuretics. 4. Please check CBC at next visit.  PRIMARY DISCHARGE DIAGNOSIS:  Principal Problem:   Acute on chronic combined systolic and diastolic CHF (congestive heart failure) Active Problems:   Iron deficiency anemia   Hypertension   CAD- CABG x3 '09. Myoview no ischemia 11/27/13   Peripheral vascular disease   Bradycardia   Convulsions/seizures   Leg pain, bilateral   Cocaine abuse   Abdominal pain, generalized   Chronic leg pain      PAST MEDICAL HISTORY: Past Medical History  Diagnosis Date  . Iron deficiency anemia   . Chronic combined systolic and diastolic CHF, NYHA class 2     a. 11/2013 Echo: EF 40-45%, basl-mid inflat AK, Gr 2 DD.  [name] Hyperlipidemia LDL goal <70   . Ischemic cardiomyopathy     a. 11/2013 Echo: EF 40-45%, basl-mid inflat AK, Gr 2 DD, mild AI, mod dil LA/RA, mod reduced RV fxn, PASP 18mmHg.  . Moderate to severe pulmonary hypertension 03/2010    a. PA peak pressure of 76 mmHg (per 2D Echo 03/2010)  . Polysubstance abuse     a. cocaine, THC  . AV malformation of gastrointestinal tract     a. w/ h/o GIB.  [name] Family history of early CAD   . Peripheral vascular disease   . Seizure disorder   . Hypertension   . CAD (coronary artery disease)     a. s/p 3-vessel CABG (12/2007) // 100% RCA stenosis with collaterals from left system. Severe bifurcation lesions of proxima CXA and OM. Moderate LAD disease - followed by Dr. Wyline Copas in [facility];  b. 11/2013 Myoview: Large fixed inf defect w/o ischemia, EF 35%.  .  Chronic kidney disease (CKD), stage III (moderate)     a. BL SCr 1.5-1.6  . Renal artery stenosis   . Cancer     "I have cancer; right now it's not known what kind" (09/17/2014)  . Myocardial infarction 08/2007; 12/2007  . Anginal pain   . COPD (chronic obstructive pulmonary disease)   . Pneumonia     "I've had pneumonia once" (09/17/2014)  . Obstructive sleep apnea     "never told me to wear mask" (09/17/2014)  . History of blood transfusion "I've had quite a few"    "they don't know where it's coming from" (09/17/2014)  . History of hiatal hernia   . Seizures     "I've had a few; don't know what kind they call it" (09/17/2014)  . DDD (degenerative disc disease), lumbar   . Arthritis     "ankles; left hip; wrists" (09/17/2014)  . Chronic lower back pain   . Depression   . Bipolar disorder   . Schizophrenia     DISCHARGE MEDICATIONS: Current Discharge Medication List    START taking these medications   Details  amLODipine (NORVASC) 10 MG tablet Take 1 tablet (10 mg total) by mouth daily. Qty: 30 tablet, Refills: 0    torsemide (DEMADEX) 20 MG tablet Take 2 tablets (40 mg total) by mouth 2 (two) times daily. Qty: 120 tablet, Refills: 0      CONTINUE these  medications which have CHANGED   Details  aspirin EC 81 MG tablet Take 1 tablet (81 mg total) by mouth daily.      CONTINUE these medications which have NOT CHANGED   Details  !! albuterol (PROVENTIL HFA;VENTOLIN HFA) 108 (90 BASE) MCG/ACT inhaler Inhale 2 puffs into the lungs every 6 (six) hours as needed for wheezing or shortness of breath. Qty: 1 Inhaler, Refills: 2    budesonide-formoterol (SYMBICORT) 80-4.5 MCG/ACT inhaler Inhale 2 puffs into the lungs 2 (two) times daily. Qty: 1 Inhaler, Refills: 12    citalopram (CELEXA) 20 MG tablet Take 20 mg by mouth daily.    cloNIDine (CATAPRES) 0.3 MG tablet Take 1 tablet (0.3 mg total) by mouth 2 (two) times daily. Qty: 60 tablet, Refills: 3    divalproex (DEPAKOTE ER)  500 MG 24 hr tablet Take 500 mg by mouth 2 (two) times daily.    ferrous sulfate 325 (65 FE) MG tablet Take 325 mg by mouth 2 (two) times daily with a meal.     hydrALAZINE (APRESOLINE) 100 MG tablet Take 100 mg by mouth 3 (three) times daily.    HYDROcodone-acetaminophen (NORCO/VICODIN) 5-325 MG per tablet Take 2 tablets by mouth every 6 (six) hours as needed for moderate pain. Qty: 30 tablet, Refills: 0    isosorbide mononitrate (IMDUR) 30 MG 24 hr tablet Take 2 tablets (60 mg total) by mouth daily. Qty: 30 tablet, Refills: 0    nitroGLYCERIN (NITROSTAT) 0.4 MG SL tablet Place 1 tablet (0.4 mg total) under the tongue every 5 (five) minutes as needed for chest pain (upto max 3 doses at one time.). Qty: 30 tablet, Refills: 0    pantoprazole (PROTONIX) 40 MG tablet Take 40 mg by mouth daily.     potassium chloride SA (KLOR-CON M20) 20 MEQ tablet Take 20 mEq by mouth daily.    !! albuterol (PROVENTIL HFA;VENTOLIN HFA) 108 (90 BASE) MCG/ACT inhaler Inhale 2 puffs into the lungs every 6 (six) hours as needed for wheezing or shortness of breath. Qty: 1 Inhaler, Refills: 2     !! - Potential duplicate medications found. Please discuss with provider.    STOP taking these medications     furosemide (LASIX) 40 MG tablet      lisinopril (PRINIVIL,ZESTRIL) 20 MG tablet      metolazone (ZAROXOLYN) 5 MG tablet      cyclobenzaprine (FLEXERIL) 5 MG tablet      furosemide (LASIX) 80 MG tablet      predniSONE (DELTASONE) 10 MG tablet         ALLERGIES:   Allergies  Allergen Reactions  . Motrin [Ibuprofen] Other (See Comments)    Affects kidneys  . Tylenol [Acetaminophen] Other (See Comments)    Affects kidneys    BRIEF HPI:  See H&P, Labs, Consult and Test reports for all details in brief, patient is a 69 year old male with a history of chronic diastolic heart failure, noncompliance to medications, chronic cocaine abuse who presented with worsening shortness of breath and bilateral  lower extremity edema. He was subsequently admitted for further evaluation and treatment  CONSULTATIONS:   cardiology  PERTINENT RADIOLOGIC STUDIES: Dg Chest 2 View  09/15/2014   CLINICAL DATA:  69 year old male with a history of congestive heart failure. Four day history of lower extremity swelling.  EXAM: CHEST - 2 VIEW  COMPARISON:  09/13/2014, 08/29/2014, 08/27/2014  FINDINGS: Cardiomediastinal silhouette unchanged, with persisting cardiomegaly.  Atherosclerosis.  Surgical changes of median sternotomy and coronary artery bypass  grafting.  Slight improvement in aeration from the comparison study, with improved lung volumes and decreasing prominence of interlobular septal thickening and interstitial opacities.  No evidence of pleural effusion.  No pneumothorax.  No displaced fracture.  Unremarkable appearance of the upper abdomen.  IMPRESSION: Improving aeration with mild persisting pulmonary vascular congestion.  No pleural effusions or confluent airspace disease.  Cardiomegaly.  Signed,  Dulcy Fanny. Earleen Newport, DO  [facility]  [facility]   Electronically Signed   By: Corrie Mckusick D.O.   On: 09/15/2014 15:23   Dg Chest 2 View  08/29/2014   CLINICAL DATA:  Fluid build-up at the legs.  Initial encounter.  EXAM: CHEST  2 VIEW  COMPARISON:  Chest radiograph performed 08/27/2014  FINDINGS: The lungs are well-aerated. Vascular congestion is noted, with increased interstitial markings, concerning for mild interstitial edema. No pleural effusion or pneumothorax is seen.  The heart is enlarged. The patient is status post median sternotomy, with evidence of prior CABG. No acute osseous abnormalities are seen.  IMPRESSION: Vascular congestion and cardiomegaly, with increased interstitial markings, concerning for mild interstitial edema.   Electronically Signed   By: Garald Balding M.D.   On: 08/29/2014 19:35   US Abdomen Limited  09/16/2014   CLINICAL DATA:  Abdominal  pain.  History of cirrhosis.  EXAM: LIMITED ABDOMEN ULTRASOUND FOR ASCITES  TECHNIQUE: Limited ultrasound survey for ascites was performed in all four abdominal quadrants.  COMPARISON:  CT 07/22/2014.  Ultrasound 08/01/2014.  FINDINGS: No significant ascites identified on sonographic survey of 4 abdominal quadrants. Echogenic liver noted.  IMPRESSION: No evidence of recurrent ascites.   Electronically Signed   By: Richardean Sale M.D.   On: 09/16/2014 20:33   Dg Abd Portable 1v  09/15/2014   CLINICAL DATA:  Abdominal pain and distention.  Constipation.  EXAM: PORTABLE ABDOMEN - 1 VIEW  COMPARISON:  07/03/2014 and CT scan abdomen dated 07/22/2014  FINDINGS: Chronic cardiomegaly. Bowel gas pattern is normal. Vascular calcifications throughout the abdomen including the renal arteries. Slight lumbar scoliosis. No excessive stool in the colon.  IMPRESSION: Benign appearing abdomen.   Electronically Signed   By: Lorriane Shire M.D.   On: 09/15/2014 20:23     PERTINENT LAB RESULTS: CBC:  Recent Labs  09/17/14 0430 09/18/14 0406  WBC 5.9 5.7  HGB 7.4* 8.0*  HCT 23.8* 26.0*  PLT 227 270   CMET CMP     Component Value Date/Time   NA 135 09/18/2014 0406   K 3.5 09/18/2014 0406   CL 94* 09/18/2014 0406   CO2 30 09/18/2014 0406   GLUCOSE 93 09/18/2014 0406   BUN 31* 09/18/2014 0406   CREATININE 1.69* 09/18/2014 0406   CREATININE 1.32 05/11/2010 2300   CALCIUM 9.0 09/18/2014 0406   PROT 6.1* 09/16/2014 0806   ALBUMIN 2.8* 09/16/2014 0806   AST 39 09/16/2014 0806   ALT 28 09/16/2014 0806   ALKPHOS 86 09/16/2014 0806   BILITOT 1.0 09/16/2014 0806   GFRNONAA 39* 09/18/2014 0406   GFRAA 46* 09/18/2014 0406    GFR Estimated Creatinine Clearance: 38.8 mL/min (by C-G formula based on Cr of 1.69).  Recent Labs  09/15/14 2023  LIPASE 28    Recent Labs  09/15/14 2023 09/16/14 0135 09/16/14 0806  CKTOTAL  --  35*  --   TROPONINI 0.04* 0.04* 0.04*   Invalid input(s): POCBNP No  results for input(s): DDIMER in the last 72 hours. No results for input(s): HGBA1C in  the last 72 hours. No results for input(s): CHOL, HDL, LDLCALC, TRIG, CHOLHDL, LDLDIRECT in the last 72 hours. No results for input(s): TSH, T4TOTAL, T3FREE, THYROIDAB in the last 72 hours.  Invalid input(s): FREET3 No results for input(s): VITAMINB12, FOLATE, FERRITIN, TIBC, IRON, RETICCTPCT in the last 72 hours. Coags: No results for input(s): INR in the last 72 hours.  Invalid input(s): PT Microbiology: No results found for this or any previous visit (from the past 240 hour(s)).   BRIEF HOSPITAL COURSE:   Principal Problem: Acute on chronic diastolic CHF ((EF 69-62% 9/52): Recurrent numerous admissions, suspect secondary to noncompliance, poor insight and ongoing cocaine use. Cardiology consulted, placed on IV diuretics, weight decreased to 167 lbs (176 lb on admission). Seen by cardiology today, recommendations are to discharge on the above noted medications. Will need close monitoring of electrolytes in the outpatient setting.   Active Problems: Abdominal discomfort: Resolved. Suspect mostly secondary to hepatic congestion from above. Abdominal ultrasound showed no evidence of recurrent ascites. X-ray abdomen on admission negative for acute abnormalities. Abdomen is soft, patient is tolerating diet, having bowel movements. Managed with supportive care only.   History of peripheral vascular disease: Likely etiology for bilateral foot pain. Recent ABI's showed severe peripheral vascular disease, right 0.42 and left 0.22. Patient underwent arteriogram on 6/20, recommendations were for aggressive conservative medical management, tobacco cessation. If symptoms progressed then a right-to-left femorofemoral femoropopliteal bypass at some point. Continue aspirin  Stage III chronic kidney disease: Creatinine remains close to usual baseline. Follow closely while on diuretics   History of CAD: Status post CABG,  continue aspirin. Avoid beta blockers given ongoing cocaine use.  History of? Cirrhosis of the liver: Noted radiographically on CT scan and also on ultrasound. Unclear etiology, but could have cardiac cirrhosis. We'll defer further workup to the outpatient setting.  Hypertension: Controlled, continue amlodipine, clonidine, hydralazine and Imdur. Follow and titrate accordingly  Anemia: Suspect related to anemia of chronic disease/kidney failure.Was slowly decreasing over the past few days-however on day of discharge stable and close to usual baseline. Please note -no indication of overt GI loss (brown stools yesterday).   Cocaine abuse: Claims last use 2 weeks ago. Counseling done again.  Suspected noncompliance to medications: Causing repeated Hospitalization. Counseled.  History of COPD: Currently stable. Continue Symbicort and nebs.  TODAY-DAY OF DISCHARGE:  Subjective:   Verne Carrow today has no headache,no chest abdominal pain,no new weakness tingling or numbness, feels much better wants to go home today.   Objective:   Blood pressure 117/63, pulse 54, temperature 97.8 F (36.6 C), temperature source Oral, resp. rate 20, height 5\' 5"  (1.651 m), weight 76.159 kg (167 lb 14.4 oz), SpO2 99 %.  Intake/Output Summary (Last 24 hours) at 09/18/14 1048 Last data filed at 09/18/14 0923  Gross per 24 hour  Intake   2020 ml  Output 3300.5 ml  Net -1280.5 ml   Filed Weights   09/16/14 0532 09/17/14 0515 09/18/14 0629  Weight: 79.843 kg (176 lb 0.4 oz) 79.198 kg (174 lb 9.6 oz) 76.159 kg (167 lb 14.4 oz)    Exam Awake Alert, Oriented *3, No new F.N deficits, Normal affect [place].AT,PERRAL Supple Neck,No JVD, No cervical lymphadenopathy appriciated.  Symmetrical Chest wall movement, Good air movement bilaterally, CTAB RRR,No Gallops,Rubs or new Murmurs, No Parasternal Heave +ve B.Sounds, Abd Soft, Non tender, No organomegaly appriciated, No rebound -guarding or rigidity. No  Cyanosis, Clubbing or edema, No new Rash or bruise  DISCHARGE CONDITION: Stable  DISPOSITION:  SNF ALF  Home Home with home health services/[facility]  DISCHARGE INSTRUCTIONS:    Activity:  As tolerated with Full fall precautions use walker/cane & assistance as needed  Diet recommendation: Heart Healthy diet Fluid restriction 1.5 lit/day Aspiration precautions:yes/No  Discharge Instructions    Contraindication beta blocker-discharge    Complete by:  As directed      Contraindication to ARB at discharge    Complete by:  As directed      Diet - low sodium heart healthy    Complete by:  As directed      Heart Failure patients record your daily weight using the same scale at the same time of day    Complete by:  As directed      Increase activity slowly    Complete by:  As directed      STOP any activity that causes chest pain, shortness of breath, dizziness, sweating, or exessive weakness    Complete by:  As directed            Follow-up Information    Follow up with Glori Bickers, MD On 09/25/2014.   Specialty:  Cardiology   Why:  at 10:30 Garage Code [id number]   Contact information:   [place] [address] [phone]      Total Time spent on discharge equals 45 minutes.  SignedOren Binet 09/18/2014 10:48 AM

## 2014-09-18 NOTE — Progress Notes (Signed)
Reviewed discharge instructions with patient, including f/u appointment with Dr. Haroldine Laws for July 8th at 10:30.  Pt stated his appt was for July 5th and not 8th.  I called [facility] @ [phone] to confirm appt for July 8th and pt informed that July 8th is correct date.  Prescriptions given for amlodipine and torsemide.  Pt stated understanding of all instructions and had no voiced complaints.  Discharged for home via wheelchair escort to ride home.

## 2014-09-22 ENCOUNTER — Emergency Department (HOSPITAL_BASED_OUTPATIENT_CLINIC_OR_DEPARTMENT_OTHER)
Admission: EM | Admit: 2014-09-22 | Discharge: 2014-09-22 | Disposition: A | Discharge status: Home / Self Care | Payer: Medicare Other | Attending: Emergency Medicine | Admitting: Emergency Medicine

## 2014-09-22 ENCOUNTER — Encounter (HOSPITAL_BASED_OUTPATIENT_CLINIC_OR_DEPARTMENT_OTHER): Payer: Self-pay | Admitting: *Deleted

## 2014-09-22 ENCOUNTER — Encounter (HOSPITAL_COMMUNITY): Payer: Medicare Other

## 2014-09-22 DIAGNOSIS — M159 Polyosteoarthritis, unspecified: Secondary | ICD-10-CM | POA: Diagnosis not present

## 2014-09-22 DIAGNOSIS — F1910 Other psychoactive substance abuse, uncomplicated: Secondary | ICD-10-CM

## 2014-09-22 DIAGNOSIS — Z8701 Personal history of pneumonia (recurrent): Secondary | ICD-10-CM | POA: Diagnosis not present

## 2014-09-22 DIAGNOSIS — F1110 Opioid abuse, uncomplicated: Secondary | ICD-10-CM | POA: Insufficient documentation

## 2014-09-22 DIAGNOSIS — F1410 Cocaine abuse, uncomplicated: Secondary | ICD-10-CM | POA: Diagnosis not present

## 2014-09-22 DIAGNOSIS — I252 Old myocardial infarction: Secondary | ICD-10-CM | POA: Diagnosis not present

## 2014-09-22 DIAGNOSIS — I129 Hypertensive chronic kidney disease with stage 1 through stage 4 chronic kidney disease, or unspecified chronic kidney disease: Secondary | ICD-10-CM | POA: Diagnosis not present

## 2014-09-22 DIAGNOSIS — I25119 Atherosclerotic heart disease of native coronary artery with unspecified angina pectoris: Secondary | ICD-10-CM | POA: Diagnosis not present

## 2014-09-22 DIAGNOSIS — Z9861 Coronary angioplasty status: Secondary | ICD-10-CM | POA: Diagnosis not present

## 2014-09-22 DIAGNOSIS — F329 Major depressive disorder, single episode, unspecified: Secondary | ICD-10-CM | POA: Diagnosis not present

## 2014-09-22 DIAGNOSIS — G8929 Other chronic pain: Secondary | ICD-10-CM | POA: Insufficient documentation

## 2014-09-22 DIAGNOSIS — Z8719 Personal history of other diseases of the digestive system: Secondary | ICD-10-CM | POA: Insufficient documentation

## 2014-09-22 DIAGNOSIS — D509 Iron deficiency anemia, unspecified: Secondary | ICD-10-CM | POA: Diagnosis not present

## 2014-09-22 DIAGNOSIS — G40909 Epilepsy, unspecified, not intractable, without status epilepticus: Secondary | ICD-10-CM | POA: Insufficient documentation

## 2014-09-22 DIAGNOSIS — J449 Chronic obstructive pulmonary disease, unspecified: Secondary | ICD-10-CM | POA: Diagnosis not present

## 2014-09-22 DIAGNOSIS — Z859 Personal history of malignant neoplasm, unspecified: Secondary | ICD-10-CM | POA: Insufficient documentation

## 2014-09-22 DIAGNOSIS — N183 Chronic kidney disease, stage 3 (moderate): Secondary | ICD-10-CM | POA: Insufficient documentation

## 2014-09-22 DIAGNOSIS — Z8639 Personal history of other endocrine, nutritional and metabolic disease: Secondary | ICD-10-CM | POA: Insufficient documentation

## 2014-09-22 DIAGNOSIS — I5042 Chronic combined systolic (congestive) and diastolic (congestive) heart failure: Secondary | ICD-10-CM | POA: Insufficient documentation

## 2014-09-22 DIAGNOSIS — Z87891 Personal history of nicotine dependence: Secondary | ICD-10-CM | POA: Diagnosis not present

## 2014-09-22 DIAGNOSIS — Z951 Presence of aortocoronary bypass graft: Secondary | ICD-10-CM | POA: Diagnosis not present

## 2014-09-22 DIAGNOSIS — Z87738 Personal history of other specified (corrected) congenital malformations of digestive system: Secondary | ICD-10-CM | POA: Insufficient documentation

## 2014-09-22 DIAGNOSIS — Z7951 Long term (current) use of inhaled steroids: Secondary | ICD-10-CM | POA: Insufficient documentation

## 2014-09-22 DIAGNOSIS — M549 Dorsalgia, unspecified: Secondary | ICD-10-CM | POA: Diagnosis present

## 2014-09-22 DIAGNOSIS — Z79899 Other long term (current) drug therapy: Secondary | ICD-10-CM | POA: Insufficient documentation

## 2014-09-22 DIAGNOSIS — Z7982 Long term (current) use of aspirin: Secondary | ICD-10-CM | POA: Insufficient documentation

## 2014-09-22 LAB — RAPID URINE DRUG SCREEN, HOSP PERFORMED
Amphetamines: NOT DETECTED
Barbiturates: NOT DETECTED
Benzodiazepines: NOT DETECTED
COCAINE: POSITIVE — AB
Opiates: POSITIVE — AB
Tetrahydrocannabinol: NOT DETECTED

## 2014-09-22 MED ORDER — METHOCARBAMOL 500 MG PO TABS
1000.0000 mg | ORAL_TABLET | Freq: Once | ORAL | Status: AC
  Administered 2014-09-22: 1000 mg via ORAL
  Filled 2014-09-22: qty 2

## 2014-09-22 MED ORDER — DICLOFENAC SODIUM 1 % TD GEL: 4.0000 g | Freq: Four times a day (QID) | TRANSDERMAL | Status: AC

## 2014-09-22 NOTE — ED Notes (Signed)
States couldn't sleep  Due to pain from neck to ankles   Denies injury

## 2014-09-22 NOTE — ED Notes (Signed)
Pt does have locator ankle bracelet on rt ankle

## 2014-09-22 NOTE — ED Notes (Signed)
No answer at contact number

## 2014-09-22 NOTE — ED Notes (Addendum)
Help pt call daughter Janace Hoard at [phone] to come pick him up  i talked to [place]  She said she would get someone to come get him

## 2014-09-22 NOTE — ED Notes (Addendum)
Un able to Contact with anyone at [phone] x [phone]

## 2014-09-22 NOTE — ED Provider Notes (Signed)
CSN: [id number]     Arrival date & time 09/22/14  0142 History   First MD Initiated Contact with Patient 09/22/14 [phone]     Chief Complaint  Patient presents with  . Pain     (Consider location/radiation/quality/duration/timing/severity/associated sxs/prior Treatment) Patient is a 69 y.o. male presenting with back pain. The history is provided by the patient and the EMS personnel.  Back Pain Location:  Generalized Quality:  Aching Radiates to:  Does not radiate Pain severity:  Severe Pain is:  Same all the time Onset quality:  Unable to specify Timing:  Constant Progression:  Unchanged Context: not falling, not MCA and not MVA   Relieved by:  Nothing Worsened by:  Nothing tried Ineffective treatments:  None tried Associated symptoms: no abdominal pain, no abdominal swelling, no bladder incontinence, no bowel incontinence, no chest pain, no fever, no headaches, no leg pain, no numbness, no paresthesias, no pelvic pain, no perianal numbness, no tingling, no weakness and no weight loss   Risk factors: no hx of cancer   Also pain everywhere in the body.    Past Medical History  Diagnosis Date  . Iron deficiency anemia   . Chronic combined systolic and diastolic CHF, NYHA class 2     a. 11/2013 Echo: EF 40-45%, basl-mid inflat AK, Gr 2 DD.  [name] Hyperlipidemia LDL goal <70   . Ischemic cardiomyopathy     a. 11/2013 Echo: EF 40-45%, basl-mid inflat AK, Gr 2 DD, mild AI, mod dil LA/RA, mod reduced RV fxn, PASP 26mmHg.  . Moderate to severe pulmonary hypertension 03/2010    a. PA peak pressure of 76 mmHg (per 2D Echo 03/2010)  . Polysubstance abuse     a. cocaine, THC  . AV malformation of gastrointestinal tract     a. w/ h/o GIB.  [name] Family history of early CAD   . Peripheral vascular disease   . Seizure disorder   . Hypertension   . CAD (coronary artery disease)     a. s/p 3-vessel CABG (12/2007) // 100% RCA stenosis with collaterals from left system. Severe bifurcation lesions of  proxima CXA and OM. Moderate LAD disease - followed by Dr. Wyline Copas in [facility];  b. 11/2013 Myoview: Large fixed inf defect w/o ischemia, EF 35%.  . Chronic kidney disease (CKD), stage III (moderate)     a. BL SCr 1.5-1.6  . Renal artery stenosis   . Cancer     "I have cancer; right now it's not known what kind" (09/17/2014)  . Myocardial infarction 08/2007; 12/2007  . Anginal pain   . COPD (chronic obstructive pulmonary disease)   . Pneumonia     "I've had pneumonia once" (09/17/2014)  . Obstructive sleep apnea     "never told me to wear mask" (09/17/2014)  . History of blood transfusion "I've had quite a few"    "they don't know where it's coming from" (09/17/2014)  . History of hiatal hernia   . Seizures     "I've had a few; don't know what kind they call it" (09/17/2014)  . DDD (degenerative disc disease), lumbar   . Arthritis     "ankles; left hip; wrists" (09/17/2014)  . Chronic lower back pain   . Depression   . Bipolar disorder   . Schizophrenia    Past Surgical History  Procedure Laterality Date  . Apc  03/2010    To treat small bowel AVMs  . Esophagogastroduodenoscopy N/A 08/14/2012    Procedure: ESOPHAGOGASTRODUODENOSCOPY (EGD);  Surgeon: [name], MD;  Location: [facility] ENDOSCOPY;  Service: Endoscopy;  Laterality: N/A;  . Hot hemostasis N/A 08/14/2012    Procedure: HOT HEMOSTASIS (ARGON PLASMA COAGULATION/BICAP);  Surgeon: [name], MD;  Location: [facility] ENDOSCOPY;  Service: Endoscopy;  Laterality: N/A;  . Esophagogastroduodenoscopy (egd) with propofol N/A 08/04/2013    Procedure: ESOPHAGOGASTRODUODENOSCOPY (EGD) WITH PROPOFOL;  Surgeon: [name], MD;  Location: [facility] ENDOSCOPY;  Service: Endoscopy;  Laterality: N/A;  . Esophagogastroduodenoscopy (egd) with propofol N/A 06/08/2014    Procedure: ESOPHAGOGASTRODUODENOSCOPY (EGD) WITH PROPOFOL;  Surgeon: Milus Banister, MD;  Location: [place];  Service: Endoscopy;  Laterality: N/A;  . Peripheral vascular catheterization N/A  09/07/2014    Procedure: Abdominal Aortogram;  Surgeon: Angelia Mould, MD;  Location: [place] [facility];  Service: Cardiovascular;  Laterality: N/A;  . Coronary artery bypass graft  12/2007    "CABG X3"  . Coronary angioplasty with stent placement  08/2007   Family History  Problem Relation Age of Onset  . Heart disease Mother     unknown type  . Heart disease Father 70    died of MI at 6yo  . Heart disease Paternal Grandfather 59    died of MI  . Heart disease    . Heart disease Brother 30   History  Substance Use Topics  . Smoking status: Former Smoker -- 0.25 packs/day for 10 years    Types: Cigarettes  . Smokeless tobacco: Never Used     Comment: "quit smoking cigarettes in 2009; smoked off and on since I was 13"  . Alcohol Use: 0.0 oz/week    0 Standard drinks or equivalent per week     Comment: 09/17/2014 "maybe 4 beers/month, 12 oz cans"    Review of Systems  Constitutional: Negative for fever and weight loss.  Cardiovascular: Negative for chest pain.  Gastrointestinal: Negative for abdominal pain and bowel incontinence.  Genitourinary: Negative for bladder incontinence, difficulty urinating and pelvic pain.  Musculoskeletal: Positive for back pain. Negative for myalgias, joint swelling and neck stiffness.  Skin: Negative for rash.  Neurological: Negative for tingling, syncope, facial asymmetry, speech difficulty, weakness, numbness, headaches and paresthesias.  All other systems reviewed and are negative.     Allergies  Motrin and Tylenol  Home Medications   Prior to Admission medications   Medication Sig Start Date End Date Taking? Authorizing Provider  albuterol (PROVENTIL HFA;VENTOLIN HFA) 108 (90 BASE) MCG/ACT inhaler Inhale 2 puffs into the lungs every 6 (six) hours as needed for wheezing or shortness of breath. 04/30/14   Thurnell Lose, MD  albuterol (PROVENTIL HFA;VENTOLIN HFA) 108 (90 BASE) MCG/ACT inhaler Inhale 2 puffs into the lungs every 6  (six) hours as needed for wheezing or shortness of breath. Patient not taking: Reported on 09/15/2014 09/08/14   Ripudeep Krystal Eaton, MD  amLODipine (NORVASC) 10 MG tablet Take 1 tablet (10 mg total) by mouth daily. 09/18/14   Shanker Kristeen Mans, MD  aspirin EC 81 MG tablet Take 1 tablet (81 mg total) by mouth daily. 09/18/14   Shanker Kristeen Mans, MD  budesonide-formoterol (SYMBICORT) 80-4.5 MCG/ACT inhaler Inhale 2 puffs into the lungs 2 (two) times daily. 09/08/14   Ripudeep Krystal Eaton, MD  citalopram (CELEXA) 20 MG tablet Take 20 mg by mouth daily.    Historical Provider, MD  cloNIDine (CATAPRES) 0.3 MG tablet Take 1 tablet (0.3 mg total) by mouth 2 (two) times daily. 09/14/14   Jolaine Artist, MD  divalproex (DEPAKOTE ER)  500 MG 24 hr tablet Take 500 mg by mouth 2 (two) times daily. 05/29/14   Historical Provider, MD  ferrous sulfate 325 (65 FE) MG tablet Take 325 mg by mouth 2 (two) times daily with a meal.     Historical Provider, MD  hydrALAZINE (APRESOLINE) 100 MG tablet Take 100 mg by mouth 3 (three) times daily. 05/29/14   Historical Provider, MD  HYDROcodone-acetaminophen (NORCO/VICODIN) 5-325 MG per tablet Take 2 tablets by mouth every 6 (six) hours as needed for moderate pain. 09/08/14   Ripudeep Krystal Eaton, MD  isosorbide mononitrate (IMDUR) 30 MG 24 hr tablet Take 2 tablets (60 mg total) by mouth daily. 07/26/14   Modena Jansky, MD  nitroGLYCERIN (NITROSTAT) 0.4 MG SL tablet Place 1 tablet (0.4 mg total) under the tongue every 5 (five) minutes as needed for chest pain (upto max 3 doses at one time.). 07/26/14   Modena Jansky, MD  pantoprazole (PROTONIX) 40 MG tablet Take 40 mg by mouth daily.     Historical Provider, MD  potassium chloride SA (KLOR-CON M20) 20 MEQ tablet Take 20 mEq by mouth daily. 01/17/10   Historical Provider, MD  torsemide (DEMADEX) 20 MG tablet Take 2 tablets (40 mg total) by mouth 2 (two) times daily. 09/18/14   Shanker Kristeen Mans, MD   BP 108/62 mmHg  Pulse 74  Temp(Src) 98.3 F  (36.8 C) (Oral)  Resp 20  SpO2 98% Physical Exam  Constitutional: He is oriented to person, place, and time. He appears well-developed and well-nourished.  Sound asleep in room.  Answers questions and then immediately goes back to sleep  HENT:  Head: Normocephalic and atraumatic.  Right Ear: External ear normal.  Left Ear: External ear normal.  Mouth/Throat: Oropharynx is clear and moist.  Eyes: EOM are normal.  Neck: Normal range of motion. Neck supple.  Cardiovascular: Normal rate, regular rhythm and intact distal pulses.   Pulmonary/Chest: Effort normal and breath sounds normal. No respiratory distress. He has no wheezes. He has no rales. He exhibits no tenderness.  Abdominal: Soft. Bowel sounds are normal. There is no tenderness. There is no rebound and no guarding.  Musculoskeletal: Normal range of motion. He exhibits no edema or tenderness.  Neurological: He is alert and oriented to person, place, and time. He has normal reflexes. He displays normal reflexes. He exhibits normal muscle tone.  Sensation intact tested with tongue blade with patient's nurse present.    Skin: Skin is warm and dry.    ED Course  Procedures (including critical care time) Labs Review Labs Reviewed  URINE RAPID DRUG SCREEN, HOSP PERFORMED    Imaging Review No results found.   EKG Interpretation None      MDM   Final diagnoses:  None    Results for orders placed or performed during the hospital encounter of 09/22/14  Urine rapid drug screen (hosp performed)  Result Value Ref Range   Opiates POSITIVE (A) NONE DETECTED   Cocaine POSITIVE (A) NONE DETECTED   Benzodiazepines NONE DETECTED NONE DETECTED   Amphetamines NONE DETECTED NONE DETECTED   Tetrahydrocannabinol NONE DETECTED NONE DETECTED   Barbiturates NONE DETECTED NONE DETECTED   Dg Chest 2 View  09/15/2014   CLINICAL DATA:  69 year old male with a history of congestive heart failure. Four day history of lower extremity  swelling.  EXAM: CHEST - 2 VIEW  COMPARISON:  09/13/2014, 08/29/2014, 08/27/2014  FINDINGS: Cardiomediastinal silhouette unchanged, with persisting cardiomegaly.  Atherosclerosis.  Surgical changes of median  sternotomy and coronary artery bypass grafting.  Slight improvement in aeration from the comparison study, with improved lung volumes and decreasing prominence of interlobular septal thickening and interstitial opacities.  No evidence of pleural effusion.  No pneumothorax.  No displaced fracture.  Unremarkable appearance of the upper abdomen.  IMPRESSION: Improving aeration with mild persisting pulmonary vascular congestion.  No pleural effusions or confluent airspace disease.  Cardiomegaly.  Signed,  Dulcy Fanny. Earleen Newport, DO  [facility]  [facility]   Electronically Signed   By: Corrie Mckusick D.O.   On: 09/15/2014 15:23   Dg Chest 2 View  08/29/2014   CLINICAL DATA:  Fluid build-up at the legs.  Initial encounter.  EXAM: CHEST  2 VIEW  COMPARISON:  Chest radiograph performed 08/27/2014  FINDINGS: The lungs are well-aerated. Vascular congestion is noted, with increased interstitial markings, concerning for mild interstitial edema. No pleural effusion or pneumothorax is seen.  The heart is enlarged. The patient is status post median sternotomy, with evidence of prior CABG. No acute osseous abnormalities are seen.  IMPRESSION: Vascular congestion and cardiomegaly, with increased interstitial markings, concerning for mild interstitial edema.   Electronically Signed   By: Garald Balding M.D.   On: 08/29/2014 19:35   US Abdomen Limited  09/16/2014   CLINICAL DATA:  Abdominal pain.  History of cirrhosis.  EXAM: LIMITED ABDOMEN ULTRASOUND FOR ASCITES  TECHNIQUE: Limited ultrasound survey for ascites was performed in all four abdominal quadrants.  COMPARISON:  CT 07/22/2014.  Ultrasound 08/01/2014.  FINDINGS: No significant ascites identified on sonographic survey of 4  abdominal quadrants. Echogenic liver noted.  IMPRESSION: No evidence of recurrent ascites.   Electronically Signed   By: Richardean Sale M.D.   On: 09/16/2014 20:33   Dg Abd Portable 1v  09/15/2014   CLINICAL DATA:  Abdominal pain and distention.  Constipation.  EXAM: PORTABLE ABDOMEN - 1 VIEW  COMPARISON:  07/03/2014 and CT scan abdomen dated 07/22/2014  FINDINGS: Chronic cardiomegaly. Bowel gas pattern is normal. Vascular calcifications throughout the abdomen including the renal arteries. Slight lumbar scoliosis. No excessive stool in the colon.  IMPRESSION: Benign appearing abdomen.   Electronically Signed   By: Lorriane Shire M.D.   On: 09/15/2014 20:23    Suspect patient is coming down off cocaine and narcotics.  While patient was written an RX for pain medication there is none listed in the [facility] urine is positive for narcotics and cocaine.      Veatrice Kells, MD 09/22/14 [phone]

## 2014-09-22 NOTE — ED Notes (Signed)
Called [facility]ie again  Got voice mail box that was full  Did leave call back number to [facility]

## 2014-09-22 NOTE — ED Notes (Signed)
Pt arrived ems c/o pain from head to toes,  X 1 day,   Pt unable to stay awake to answer questions

## 2014-09-22 NOTE — ED Notes (Signed)
Pt has stated 2-3 times that he wants to be admitted

## 2014-09-22 NOTE — ED Notes (Signed)
cbg 155

## 2014-09-22 NOTE — ED Notes (Signed)
Called [facility]  They will try to get someone from home to come get him

## 2014-09-25 ENCOUNTER — Encounter (HOSPITAL_COMMUNITY): Payer: Medicare Other

## 2014-10-05 ENCOUNTER — Inpatient Hospital Stay (HOSPITAL_COMMUNITY)
Admission: EM | Admit: 2014-10-05 | Discharge: 2014-10-07 | DRG: 303 | Disposition: A | Discharge status: Home / Self Care | Payer: Medicare Other | Attending: Internal Medicine | Admitting: Internal Medicine

## 2014-10-05 ENCOUNTER — Encounter (HOSPITAL_COMMUNITY): Payer: Self-pay

## 2014-10-05 ENCOUNTER — Emergency Department (HOSPITAL_COMMUNITY): Payer: Medicare Other

## 2014-10-05 DIAGNOSIS — R7989 Other specified abnormal findings of blood chemistry: Secondary | ICD-10-CM | POA: Diagnosis present

## 2014-10-05 DIAGNOSIS — R569 Unspecified convulsions: Secondary | ICD-10-CM

## 2014-10-05 DIAGNOSIS — F209 Schizophrenia, unspecified: Secondary | ICD-10-CM | POA: Diagnosis present

## 2014-10-05 DIAGNOSIS — R9431 Abnormal electrocardiogram [ECG] [EKG]: Secondary | ICD-10-CM | POA: Insufficient documentation

## 2014-10-05 DIAGNOSIS — I209 Angina pectoris, unspecified: Secondary | ICD-10-CM | POA: Diagnosis not present

## 2014-10-05 DIAGNOSIS — G4733 Obstructive sleep apnea (adult) (pediatric): Secondary | ICD-10-CM | POA: Diagnosis present

## 2014-10-05 DIAGNOSIS — Z79899 Other long term (current) drug therapy: Secondary | ICD-10-CM

## 2014-10-05 DIAGNOSIS — R1013 Epigastric pain: Secondary | ICD-10-CM | POA: Diagnosis not present

## 2014-10-05 DIAGNOSIS — I739 Peripheral vascular disease, unspecified: Secondary | ICD-10-CM | POA: Diagnosis present

## 2014-10-05 DIAGNOSIS — I129 Hypertensive chronic kidney disease with stage 1 through stage 4 chronic kidney disease, or unspecified chronic kidney disease: Secondary | ICD-10-CM | POA: Diagnosis present

## 2014-10-05 DIAGNOSIS — Z590 Homelessness: Secondary | ICD-10-CM

## 2014-10-05 DIAGNOSIS — K31819 Angiodysplasia of stomach and duodenum without bleeding: Secondary | ICD-10-CM

## 2014-10-05 DIAGNOSIS — F319 Bipolar disorder, unspecified: Secondary | ICD-10-CM | POA: Diagnosis present

## 2014-10-05 DIAGNOSIS — I255 Ischemic cardiomyopathy: Secondary | ICD-10-CM | POA: Diagnosis present

## 2014-10-05 DIAGNOSIS — I25119 Atherosclerotic heart disease of native coronary artery with unspecified angina pectoris: Secondary | ICD-10-CM | POA: Diagnosis present

## 2014-10-05 DIAGNOSIS — I2510 Atherosclerotic heart disease of native coronary artery without angina pectoris: Secondary | ICD-10-CM | POA: Diagnosis not present

## 2014-10-05 DIAGNOSIS — R079 Chest pain, unspecified: Secondary | ICD-10-CM

## 2014-10-05 DIAGNOSIS — I5032 Chronic diastolic (congestive) heart failure: Secondary | ICD-10-CM | POA: Diagnosis present

## 2014-10-05 DIAGNOSIS — I4719 Other supraventricular tachycardia: Secondary | ICD-10-CM | POA: Diagnosis present

## 2014-10-05 DIAGNOSIS — D509 Iron deficiency anemia, unspecified: Secondary | ICD-10-CM | POA: Diagnosis present

## 2014-10-05 DIAGNOSIS — Z886 Allergy status to analgesic agent status: Secondary | ICD-10-CM

## 2014-10-05 DIAGNOSIS — Z87891 Personal history of nicotine dependence: Secondary | ICD-10-CM

## 2014-10-05 DIAGNOSIS — E785 Hyperlipidemia, unspecified: Secondary | ICD-10-CM | POA: Diagnosis present

## 2014-10-05 DIAGNOSIS — I471 Supraventricular tachycardia: Secondary | ICD-10-CM | POA: Diagnosis present

## 2014-10-05 DIAGNOSIS — Z955 Presence of coronary angioplasty implant and graft: Secondary | ICD-10-CM

## 2014-10-05 DIAGNOSIS — I712 Thoracic aortic aneurysm, without rupture: Secondary | ICD-10-CM | POA: Diagnosis present

## 2014-10-05 DIAGNOSIS — Q2733 Arteriovenous malformation of digestive system vessel: Secondary | ICD-10-CM

## 2014-10-05 DIAGNOSIS — Z8249 Family history of ischemic heart disease and other diseases of the circulatory system: Secondary | ICD-10-CM

## 2014-10-05 DIAGNOSIS — J449 Chronic obstructive pulmonary disease, unspecified: Secondary | ICD-10-CM | POA: Diagnosis present

## 2014-10-05 DIAGNOSIS — I701 Atherosclerosis of renal artery: Secondary | ICD-10-CM | POA: Diagnosis present

## 2014-10-05 DIAGNOSIS — G40909 Epilepsy, unspecified, not intractable, without status epilepticus: Secondary | ICD-10-CM | POA: Diagnosis present

## 2014-10-05 DIAGNOSIS — F1410 Cocaine abuse, uncomplicated: Secondary | ICD-10-CM | POA: Diagnosis not present

## 2014-10-05 DIAGNOSIS — I272 Other secondary pulmonary hypertension: Secondary | ICD-10-CM | POA: Diagnosis present

## 2014-10-05 DIAGNOSIS — N183 Chronic kidney disease, stage 3 unspecified: Secondary | ICD-10-CM | POA: Diagnosis present

## 2014-10-05 DIAGNOSIS — Z8719 Personal history of other diseases of the digestive system: Secondary | ICD-10-CM

## 2014-10-05 DIAGNOSIS — I10 Essential (primary) hypertension: Secondary | ICD-10-CM | POA: Diagnosis present

## 2014-10-05 DIAGNOSIS — Z79891 Long term (current) use of opiate analgesic: Secondary | ICD-10-CM

## 2014-10-05 DIAGNOSIS — E872 Acidosis: Secondary | ICD-10-CM

## 2014-10-05 DIAGNOSIS — I214 Non-ST elevation (NSTEMI) myocardial infarction: Secondary | ICD-10-CM

## 2014-10-05 DIAGNOSIS — R1084 Generalized abdominal pain: Secondary | ICD-10-CM | POA: Diagnosis present

## 2014-10-05 DIAGNOSIS — I5042 Chronic combined systolic (congestive) and diastolic (congestive) heart failure: Secondary | ICD-10-CM | POA: Diagnosis present

## 2014-10-05 DIAGNOSIS — R778 Other specified abnormalities of plasma proteins: Secondary | ICD-10-CM | POA: Diagnosis present

## 2014-10-05 DIAGNOSIS — F1910 Other psychoactive substance abuse, uncomplicated: Secondary | ICD-10-CM | POA: Diagnosis present

## 2014-10-05 DIAGNOSIS — I252 Old myocardial infarction: Secondary | ICD-10-CM

## 2014-10-05 DIAGNOSIS — Z7982 Long term (current) use of aspirin: Secondary | ICD-10-CM

## 2014-10-05 DIAGNOSIS — Z951 Presence of aortocoronary bypass graft: Secondary | ICD-10-CM

## 2014-10-05 LAB — APTT: aPTT: 32 seconds (ref 24–37)

## 2014-10-05 LAB — CBC WITH DIFFERENTIAL/PLATELET
Basophils Absolute: 0 10*3/uL (ref 0.0–0.1)
Basophils Relative: 1 % (ref 0–1)
Eosinophils Absolute: 0 10*3/uL (ref 0.0–0.7)
Eosinophils Relative: 1 % (ref 0–5)
HEMATOCRIT: 28.4 % — AB (ref 39.0–52.0)
Hemoglobin: 8.5 g/dL — ABNORMAL LOW (ref 13.0–17.0)
Lymphocytes Relative: 13 % (ref 12–46)
Lymphs Abs: 0.7 10*3/uL (ref 0.7–4.0)
MCH: 27.2 pg (ref 26.0–34.0)
MCHC: 29.9 g/dL — ABNORMAL LOW (ref 30.0–36.0)
MCV: 90.7 fL (ref 78.0–100.0)
MONO ABS: 0.6 10*3/uL (ref 0.1–1.0)
Monocytes Relative: 10 % (ref 3–12)
NEUTROS PCT: 75 % (ref 43–77)
Neutro Abs: 4.4 10*3/uL (ref 1.7–7.7)
Platelets: 363 10*3/uL (ref 150–400)
RBC: 3.13 MIL/uL — AB (ref 4.22–5.81)
RDW: 19.4 % — AB (ref 11.5–15.5)
WBC: 5.8 10*3/uL (ref 4.0–10.5)

## 2014-10-05 LAB — COMPREHENSIVE METABOLIC PANEL
ALBUMIN: 3.3 g/dL — AB (ref 3.5–5.0)
ALT: 21 U/L (ref 17–63)
AST: 64 U/L — ABNORMAL HIGH (ref 15–41)
Alkaline Phosphatase: 74 U/L (ref 38–126)
Anion gap: 9 (ref 5–15)
BUN: 28 mg/dL — ABNORMAL HIGH (ref 6–20)
CO2: 24 mmol/L (ref 22–32)
CREATININE: 1.18 mg/dL (ref 0.61–1.24)
Calcium: 8.8 mg/dL — ABNORMAL LOW (ref 8.9–10.3)
Chloride: 105 mmol/L (ref 101–111)
GFR calc Af Amer: >60 mL/min (ref >60)
GFR calc non Af Amer: >60 mL/min (ref >60)
Glucose, Bld: 103 mg/dL — ABNORMAL HIGH (ref 65–99)
Potassium: 4.4 mmol/L (ref 3.5–5.1)
SODIUM: 138 mmol/L (ref 135–145)
Total Bilirubin: 0.8 mg/dL (ref 0.3–1.2)
Total Protein: 7.4 g/dL (ref 6.5–8.1)

## 2014-10-05 LAB — I-STAT CG4 LACTIC ACID, ED: Lactic Acid, Venous: 2.15 mmol/L (ref 0.5–2.0)

## 2014-10-05 LAB — PROTIME-INR
INR: 1.09 (ref 0.00–1.49)
Prothrombin Time: 14.3 seconds (ref 11.6–15.2)

## 2014-10-05 LAB — BRAIN NATRIURETIC PEPTIDE: B Natriuretic Peptide: 748.5 pg/mL — ABNORMAL HIGH (ref 0.0–100.0)

## 2014-10-05 LAB — TROPONIN I: TROPONIN I: 0.46 ng/mL — AB (ref <0.031)

## 2014-10-05 LAB — I-STAT TROPONIN, ED: Troponin i, poc: 0.4 ng/mL (ref 0.00–0.08)

## 2014-10-05 MED ORDER — ONDANSETRON HCL 4 MG/2ML IJ SOLN
4.0000 mg | Freq: Once | INTRAMUSCULAR | Status: AC
  Administered 2014-10-05: 4 mg via INTRAVENOUS
  Filled 2014-10-05: qty 2

## 2014-10-05 MED ORDER — LORAZEPAM 2 MG/ML IJ SOLN
1.0000 mg | Freq: Once | INTRAMUSCULAR | Status: AC
  Administered 2014-10-05: 1 mg via INTRAVENOUS
  Filled 2014-10-05: qty 1

## 2014-10-05 MED ORDER — ASPIRIN 81 MG PO CHEW: 324.0000 mg | CHEWABLE_TABLET | Freq: Once | ORAL | Status: DC

## 2014-10-05 NOTE — ED Provider Notes (Signed)
CSN: [id number]     Arrival date & time 10/05/14  2140 History   First MD Initiated Contact with Patient 10/05/14 2141     Chief Complaint  Patient presents with  . Chest Pain  . Emesis     (Consider location/radiation/quality/duration/timing/severity/associated sxs/prior Treatment) HPI Comments: Pt comes in with cc of chest pain, abd pain. Pt has hx of CAD, CHF, active cocaine use, Peripheral arterial disease who presents to the ER with abd pain and chest pain. Symptoms started at 4 pm, unprovoked. Last cocaine use was 1 week ago. Pt has midsternal chest pain and generalized abd pain, non radiating, with nausea and emesis x 3. Pt has no blood or bile. There is no bloody stools. Pt has no diarrhea. Pt denies any abd surgical hx. Pt also denies any new numbness, tingling, focal weakness, balance problems or vision complains.   ROS 10 Systems reviewed and are negative for acute change except as noted in the HPI.   Patient is a 69 y.o. male presenting with chest pain and vomiting. The history is provided by the patient.  Chest Pain Associated symptoms: abdominal pain, nausea and vomiting   Emesis Associated symptoms: abdominal pain     Past Medical History  Diagnosis Date  . Iron deficiency anemia   . Chronic combined systolic and diastolic CHF, NYHA class 2     a. 11/2013 Echo: EF 40-45%, basl-mid inflat AK, Gr 2 DD.  [name] Hyperlipidemia LDL goal <70   . Ischemic cardiomyopathy     a. 11/2013 Echo: EF 40-45%, basl-mid inflat AK, Gr 2 DD, mild AI, mod dil LA/RA, mod reduced RV fxn, PASP 52mmHg.  . Moderate to severe pulmonary hypertension 03/2010    a. PA peak pressure of 76 mmHg (per 2D Echo 03/2010)  . Polysubstance abuse     a. cocaine, THC  . AV malformation of gastrointestinal tract     a. w/ h/o GIB.  [name] Family history of early CAD   . Peripheral vascular disease   . Seizure disorder   . Hypertension   . CAD (coronary artery disease)     a. s/p 3-vessel CABG (12/2007) //  100% RCA stenosis with collaterals from left system. Severe bifurcation lesions of proxima CXA and OM. Moderate LAD disease - followed by Dr. Wyline Copas in [facility];  b. 11/2013 Myoview: Large fixed inf defect w/o ischemia, EF 35%.  . Chronic kidney disease (CKD), stage III (moderate)     a. BL SCr 1.5-1.6  . Renal artery stenosis   . Cancer     "I have cancer; right now it's not known what kind" (09/17/2014)  . Myocardial infarction 08/2007; 12/2007  . Anginal pain   . COPD (chronic obstructive pulmonary disease)   . Pneumonia     "I've had pneumonia once" (09/17/2014)  . Obstructive sleep apnea     "never told me to wear mask" (09/17/2014)  . History of blood transfusion "I've had quite a few"    "they don't know where it's coming from" (09/17/2014)  . History of hiatal hernia   . Seizures     "I've had a few; don't know what kind they call it" (09/17/2014)  . DDD (degenerative disc disease), lumbar   . Arthritis     "ankles; left hip; wrists" (09/17/2014)  . Chronic lower back pain   . Depression   . Bipolar disorder   . Schizophrenia    Past Surgical History  Procedure Laterality Date  . Apc  03/2010    To treat small bowel AVMs  . Esophagogastroduodenoscopy N/A 08/14/2012    Procedure: ESOPHAGOGASTRODUODENOSCOPY (EGD);  Surgeon: [name], MD;  Location: [facility] ENDOSCOPY;  Service: Endoscopy;  Laterality: N/A;  . Hot hemostasis N/A 08/14/2012    Procedure: HOT HEMOSTASIS (ARGON PLASMA COAGULATION/BICAP);  Surgeon: [name], MD;  Location: [facility] ENDOSCOPY;  Service: Endoscopy;  Laterality: N/A;  . Esophagogastroduodenoscopy (egd) with propofol N/A 08/04/2013    Procedure: ESOPHAGOGASTRODUODENOSCOPY (EGD) WITH PROPOFOL;  Surgeon: [name], MD;  Location: [facility] ENDOSCOPY;  Service: Endoscopy;  Laterality: N/A;  . Esophagogastroduodenoscopy (egd) with propofol N/A 06/08/2014    Procedure: ESOPHAGOGASTRODUODENOSCOPY (EGD) WITH PROPOFOL;  Surgeon: Milus Banister, MD;  Location: [facility];   Service: Endoscopy;  Laterality: N/A;  . Peripheral vascular catheterization N/A 09/07/2014    Procedure: Abdominal Aortogram;  Surgeon: Angelia Mould, MD;  Location: [place] [facility] LAB;  Service: Cardiovascular;  Laterality: N/A;  . Coronary artery bypass graft  12/2007    "CABG X3"  . Coronary angioplasty with stent placement  08/2007   Family History  Problem Relation Age of Onset  . Heart disease Mother     unknown type  . Heart disease Father 38    died of MI at 33yo  . Heart disease Paternal Grandfather 52    died of MI  . Heart disease    . Heart disease Brother 30   History  Substance Use Topics  . Smoking status: Former Smoker -- 0.25 packs/day for 10 years    Types: Cigarettes  . Smokeless tobacco: Never Used     Comment: "quit smoking cigarettes in 2009; smoked off and on since I was 13"  . Alcohol Use: 0.0 oz/week    0 Standard drinks or equivalent per week     Comment: 09/17/2014 "maybe 4 beers/month, 12 oz cans"    Review of Systems  Cardiovascular: Positive for chest pain.  Gastrointestinal: Positive for nausea, vomiting and abdominal pain.  All other systems reviewed and are negative.     Allergies  Motrin and Tylenol  Home Medications   Prior to Admission medications   Medication Sig Start Date End Date Taking? Authorizing Provider  albuterol (PROVENTIL HFA;VENTOLIN HFA) 108 (90 BASE) MCG/ACT inhaler Inhale 2 puffs into the lungs every 6 (six) hours as needed for wheezing or shortness of breath. 04/30/14  Yes Thurnell Lose, MD  amLODipine (NORVASC) 10 MG tablet Take 1 tablet (10 mg total) by mouth daily. 09/18/14  Yes Shanker Kristeen Mans, MD  aspirin EC 81 MG tablet Take 1 tablet (81 mg total) by mouth daily. 09/18/14  Yes Shanker Kristeen Mans, MD  budesonide-formoterol (SYMBICORT) 80-4.5 MCG/ACT inhaler Inhale 2 puffs into the lungs 2 (two) times daily. 09/08/14  Yes Ripudeep Krystal Eaton, MD  citalopram (CELEXA) 20 MG tablet Take 20 mg by mouth daily.   Yes  Historical Provider, MD  cloNIDine (CATAPRES) 0.3 MG tablet Take 1 tablet (0.3 mg total) by mouth 2 (two) times daily. 09/14/14  Yes Jolaine Artist, MD  diclofenac sodium (VOLTAREN) 1 % GEL Apply 4 g topically 4 (four) times daily. 09/22/14  Yes April Palumbo, MD  divalproex (DEPAKOTE ER) 500 MG 24 hr tablet Take 500 mg by mouth 2 (two) times daily. 05/29/14  Yes Historical Provider, MD  ferrous sulfate 325 (65 FE) MG tablet Take 325 mg by mouth 2 (two) times daily with a meal.    Yes Historical Provider, MD  hydrALAZINE (APRESOLINE) 100 MG  tablet Take 100 mg by mouth 3 (three) times daily. 05/29/14  Yes Historical Provider, MD  HYDROcodone-acetaminophen (NORCO/VICODIN) 5-325 MG per tablet Take 2 tablets by mouth every 6 (six) hours as needed for moderate pain. 09/08/14  Yes Ripudeep Krystal Eaton, MD  isosorbide mononitrate (IMDUR) 30 MG 24 hr tablet Take 2 tablets (60 mg total) by mouth daily. 07/26/14  Yes Modena Jansky, MD  nitroGLYCERIN (NITROSTAT) 0.4 MG SL tablet Place 1 tablet (0.4 mg total) under the tongue every 5 (five) minutes as needed for chest pain (upto max 3 doses at one time.). 07/26/14  Yes Modena Jansky, MD  pantoprazole (PROTONIX) 40 MG tablet Take 40 mg by mouth daily.    Yes Historical Provider, MD  potassium chloride SA (KLOR-CON M20) 20 MEQ tablet Take 20 mEq by mouth daily. 01/17/10  Yes Historical Provider, MD  torsemide (DEMADEX) 20 MG tablet Take 2 tablets (40 mg total) by mouth 2 (two) times daily. 09/18/14  Yes Shanker Kristeen Mans, MD  albuterol (PROVENTIL HFA;VENTOLIN HFA) 108 (90 BASE) MCG/ACT inhaler Inhale 2 puffs into the lungs every 6 (six) hours as needed for wheezing or shortness of breath. Patient not taking: Reported on 09/15/2014 09/08/14   Ripudeep K Rai, MD   BP 164/98 mmHg  Pulse 105  Temp(Src) 98.2 F (36.8 C) (Oral)  Resp 21  SpO2 95% Physical Exam  Constitutional: He is oriented to person, place, and time. He appears well-developed.  HENT:  Head:  Normocephalic and atraumatic.  Eyes: Conjunctivae and EOM are normal. Pupils are equal, round, and reactive to light.  Neck: Normal range of motion. Neck supple.  Cardiovascular: Normal rate and regular rhythm.   Murmur heard. 2+ radial pulse bilaterally 2+ R femoral pulse 0 L femoral pulse - confirmed with vascular surgery exam in June.  Pulmonary/Chest: Effort normal. He has rales.  Abdominal: Soft. Bowel sounds are normal. He exhibits no distension. There is tenderness. There is guarding. There is no rebound.  Diffuse tenderness  Neurological: He is alert and oriented to person, place, and time.  Skin: Skin is warm.  Nursing note and vitals reviewed.   ED Course  Procedures (including critical care time) Labs Review Labs Reviewed  CBC WITH DIFFERENTIAL/PLATELET - Abnormal; Notable for the following:    RBC 3.13 (*)    Hemoglobin 8.5 (*)    HCT 28.4 (*)    MCHC 29.9 (*)    RDW 19.4 (*)    All other components within normal limits  COMPREHENSIVE METABOLIC PANEL - Abnormal; Notable for the following:    Glucose, Bld 103 (*)    BUN 28 (*)    Calcium 8.8 (*)    Albumin 3.3 (*)    AST 64 (*)    All other components within normal limits  TROPONIN I - Abnormal; Notable for the following:    Troponin I 0.46 (*)    All other components within normal limits  URINALYSIS, ROUTINE W REFLEX MICROSCOPIC (NOT AT [facility]) - Abnormal; Notable for the following:    APPearance CLOUDY (*)    Protein, ur 100 (*)    All other components within normal limits  URINE RAPID DRUG SCREEN, HOSP PERFORMED - Abnormal; Notable for the following:    Cocaine POSITIVE (*)    Benzodiazepines POSITIVE (*)    All other components within normal limits  BRAIN NATRIURETIC PEPTIDE - Abnormal; Notable for the following:    B Natriuretic Peptide 748.5 (*)    All other components within  normal limits  URINE MICROSCOPIC-ADD ON - Abnormal; Notable for the following:    Casts HYALINE CASTS (*)    All other  components within normal limits  I-STAT TROPOININ, ED - Abnormal; Notable for the following:    Troponin i, poc 0.40 (*)    All other components within normal limits  I-STAT CG4 LACTIC ACID, ED - Abnormal; Notable for the following:    Lactic Acid, Venous 2.15 (*)    All other components within normal limits  APTT  PROTIME-INR  LIPASE, BLOOD  TROPONIN I    Imaging Review Dg Chest Port 1 View  10/05/2014   CLINICAL DATA:  69 year old male with chest pain  EXAM: PORTABLE CHEST - 1 VIEW  COMPARISON:  Radiograph dated 10/04/2014  FINDINGS: There is stable cardiomegaly with prominence of the central vasculature. An area of increased opacity at the left lower lung field may be related to an enlarged cardiac silhouette. Underlying pneumonia is not excluded. Clinical correlation is recommended. No significant pleural effusion or pneumothorax. Median sternotomy wires.  IMPRESSION: Stable cardiomegaly with congestive changes.  Left lower lung field opacity may be related to atelectasis or pneumonia. Clinical correlation is recommended.   Electronically Signed   By: Anner Crete M.D.   On: 10/05/2014 22:51     EKG Interpretation   Date/Time:  Monday October 05 2014 21:52:37 EDT Ventricular Rate:  98 PR Interval:  172 QRS Duration: 116 QT Interval:  362 QTC Calculation: 462 R Axis:   64 Text Interpretation:  Sinus rhythm Incomplete right bundle branch block  Inferior infarct, age indeterminate - q waves Lateral leads are also  involved t wave inversion in inferior and anterior leads - new Nonspecific  ST and T wave abnormality Confirmed by Kathrynn Humble, MD, D[facility]ler Maland [phone]) on  10/05/2014 10:01:36 PM       04/26/2014 IMPRESSION: CT CHEST IMPRESSION  1. Mild motion degradation. 2. No evidence of thoracic aortic dissection. Mild ascending aortic prominence, as detailed above. Recommend annual imaging followup by CTA or MRA. This recommendation follows  2010 ACCF/AHA/AATS/ACR/ASA/SCA/SCAI/SIR/STS/SVM Guidelines for the Diagnosis and Management of Patients With Thoracic Aortic Disease. Circulation. 2010; 121: U132-G401 3. Probable congestive heart failure, with bilateral pleural effusions. 4. Pulmonary artery enlargement suggests pulmonary arterial hypertension. 5. Similar mediastinal adenopathy. This could be related to congestive heart failure. Recommend attention on follow-up.  CT ABDOMEN AND PELVIS IMPRESSION  1. Mild motion degradation. 2. Advanced aortic atherosclerosis, without dissection or aneurysm. 3. Abdominal pelvic ascites. Suspect mild cirrhosis. 4. Peripancreatic interstitial thickening. Cannot exclude pancreatitis. This appearance is nonspecific in the setting of ascites. 5. Pericystic edema. This could represent cystitis. Increased density within the urinary bladder could represent Early contrast excretion or hemorrhage. Consider correlation with urinalysis.   GI Endocopy in march: Endoscopy 3/21, with nonbleeding AVM with APC treatment to AVMs in stomach, hemoglobin remained stable post transfusion.   @11 :15 pm: istat trop is slightly elevated. Ativan given - and pt's pain slightly improved. Reg trop is pending. Repeat ekg ordered  @1 :30 am: Cards wants further CT imaging of the chest and abdomen to isolate those regions and ensure there is no PE, dissection, pancreatitis. Dr. Colin Rhein to f/u on the results.  MDM   Final diagnoses:  Chest pain  Abnormal finding on EKG  NSTEMI (non-ST elevated myocardial infarction)  Cocaine abuse  Abdominal pain, diffuse    Differential diagnosis includes: ACS syndrome CHF exacerbation Valvular disorder Myocarditis Pericarditis Pericardial effusion Pneumonia Pleural effusion Pulmonary edema PE Anemia Musculoskeletal pain  Dissection Mesenteric ischemia PUD/Gastritis  Pt comes in with nausea, emesis, abd pain and chest pain  - all new, non radiating. He  ha cocaine abuse hx. He has CAD, last myoview 11/2013 - no reversible ischemia, but + fixed defects. PT had CABG in 2009. He has had several ER admission the past year for chest pain - and pt received serial trops at that time. Today however, the ekg has new diffuse T wave inversions. No STEMI seen. Trop is also elevated slightly at 0.41. Pt received ativan -possible vasospasm from cocaine use.   CRITICAL CARE Performed by: Varney Biles   Total critical care time: 65 minutes  Critical care time was exclusive of separately billable procedures and treating other patients.  Critical care was necessary to treat or prevent imminent or life-threatening deterioration.  Critical care was time spent personally by me on the following activities: development of treatment plan with patient and/or surrogate as well as nursing, discussions with consultants, evaluation of patient's response to treatment, examination of patient, obtaining history from patient or surrogate, ordering and performing treatments and interventions, ordering and review of laboratory studies, ordering and review of radiographic studies, pulse oximetry and re-evaluation of patient's condition.   Varney Biles, MD 10/06/14 [phone]

## 2014-10-05 NOTE — ED Notes (Signed)
Pt arrived via EMS c/o left sided chest pain since 4pm, generalized abdominal pain with N/V.  EMS gave 324mg  of ASA and 3 Nitro SL pain 9/10.

## 2014-10-05 NOTE — Consult Note (Addendum)
Patient ID: Charles [name] MRN: [id number], DOB/AGE: [birth year]   Admit date: 10/05/2014   Primary Physician: No PCP Per Patient Primary Cardiologist: Glori Bickers, MD  Pt. Profile:  Charles [name] is a 69 y.o. year old male with prior h/o cocaine abuse, GI bleed, diastolic CHF (EF 73-41% 9/37), CAD s/p CABG 2009, and stage 3 CKD, Myoview in December 2014 no ischemia and PAD. and frequent hospitalizations due to acute HF who presents with chest pain, abdominal pain, ECG changes, and positive troponin.   Problem List  Past Medical History  Diagnosis Date  . Iron deficiency anemia   . Chronic combined systolic and diastolic CHF, NYHA class 2     a. 11/2013 Echo: EF 40-45%, basl-mid inflat AK, Gr 2 DD.  [name] Hyperlipidemia LDL goal <70   . Ischemic cardiomyopathy     a. 11/2013 Echo: EF 40-45%, basl-mid inflat AK, Gr 2 DD, mild AI, mod dil LA/RA, mod reduced RV fxn, PASP 20mmHg.  . Moderate to severe pulmonary hypertension 03/2010    a. PA peak pressure of 76 mmHg (per 2D Echo 03/2010)  . Polysubstance abuse     a. cocaine, THC  . AV malformation of gastrointestinal tract     a. w/ h/o GIB.  [name] Family history of early CAD   . Peripheral vascular disease   . Seizure disorder   . Hypertension   . CAD (coronary artery disease)     a. s/p 3-vessel CABG (12/2007) // 100% RCA stenosis with collaterals from left system. Severe bifurcation lesions of proxima CXA and OM. Moderate LAD disease - followed by Dr. Wyline Copas in [facility];  b. 11/2013 Myoview: Large fixed inf defect w/o ischemia, EF 35%.  . Chronic kidney disease (CKD), stage III (moderate)     a. BL SCr 1.5-1.6  . Renal artery stenosis   . Cancer     "I have cancer; right now it's not known what kind" (09/17/2014)  . Myocardial infarction 08/2007; 12/2007  . Anginal pain   . COPD (chronic obstructive pulmonary disease)   . Pneumonia     "I've had pneumonia once" (09/17/2014)  . Obstructive sleep apnea     "never told me to  wear mask" (09/17/2014)  . History of blood transfusion "I've had quite a few"    "they don't know where it's coming from" (09/17/2014)  . History of hiatal hernia   . Seizures     "I've had a few; don't know what kind they call it" (09/17/2014)  . DDD (degenerative disc disease), lumbar   . Arthritis     "ankles; left hip; wrists" (09/17/2014)  . Chronic lower back pain   . Depression   . Bipolar disorder   . Schizophrenia     Past Surgical History  Procedure Laterality Date  . Apc  03/2010    To treat small bowel AVMs  . Esophagogastroduodenoscopy N/A 08/14/2012    Procedure: ESOPHAGOGASTRODUODENOSCOPY (EGD);  Surgeon: [name], MD;  Location: [facility] ENDOSCOPY;  Service: Endoscopy;  Laterality: N/A;  . Hot hemostasis N/A 08/14/2012    Procedure: HOT HEMOSTASIS (ARGON PLASMA COAGULATION/BICAP);  Surgeon: [name], MD;  Location: [facility] ENDOSCOPY;  Service: Endoscopy;  Laterality: N/A;  . Esophagogastroduodenoscopy (egd) with propofol N/A 08/04/2013    Procedure: ESOPHAGOGASTRODUODENOSCOPY (EGD) WITH PROPOFOL;  Surgeon: [name], MD;  Location: [facility] ENDOSCOPY;  Service: Endoscopy;  Laterality: N/A;  . Esophagogastroduodenoscopy (egd) with propofol N/A 06/08/2014    Procedure: ESOPHAGOGASTRODUODENOSCOPY (EGD) WITH PROPOFOL;  Surgeon: Milus Banister, MD;  Location: [facility];  Service: Endoscopy;  Laterality: N/A;  . Peripheral vascular catheterization N/A 09/07/2014    Procedure: Abdominal Aortogram;  Surgeon: Angelia Mould, MD;  Location: [facility];  Service: Cardiovascular;  Laterality: N/A;  . Coronary artery bypass graft  12/2007    "CABG X3"  . Coronary angioplasty with stent placement  08/2007     Allergies  Allergies  Allergen Reactions  . Motrin [Ibuprofen] Other (See Comments)    Affects kidneys  . Tylenol [Acetaminophen] Other (See Comments)    Affects kidneys    HPI  Charles [name] is a 69 y.o. year old male with prior h/o cocaine abuse, GI bleed,  diastolic CHF (EF 19-62% 2/29), CAD s/p CABG 2009, and stage 3 CKD, Myoview in December 2014 no ischemia and PAD. and frequent hospitalizations due to acute HF who presents with chest pain, abdominal pain, and positive troponin.   Charles [name] states that at 3pm today he was seated on the couch when he developed a "sharp" left sided chest pain that radiated to the left arm, was 10/10 in severity, and "took my wind." He reported associated abdominal pain ("like I was punched) with nausea and vomiting. + lightheadedness. No syncope or presyncope. States the chest pain was worse with ambulation and with palpation. Not worse with inspiration. He is worried that he has had a little bit of leg swelling. Denies missed medication doses. Pain was constant and EMS was called. EMS gave 324mg  of ASA and 3 SL NTG which the patient states did not impact the pain.    On arrival to the ER he was hemodynamically stable. 161/85, HR 95, RR 22, 99% on RA. Labs were notable for K 4.4, Cr 1.18, AST 64, TnI 0.46, BNP 748 (3756 2 weeks ago during HF admission), lactate 2.15. ECG demonstrated NSR, iRBBB, old IMI, possibly with posterior involvement. Diffuse T wave inversions with ST depression. Compared to prior on 09/17/14, the T wave abnormalites are now more pronounced and are now present in the anterior precordium. There is downsloping ST depression in most leads.  CXR demonstrated cardiomegaly with vascular prominence and mild edema; LLL opacity either atelectasis or PNA. He was given IV ativan, IV zofran, and 324mg  PO ASA. On my interview, he continued to c/o 9/10 chest pain and abdominal pain. Of note, he states that he does not typically have chest pains. Prior to his CABG and with prior MIs, he reported feeling "weak and woozy" with SOB.   Last cocaine repeatedly last week. He had a small beer today; states he usually has a small beer a few times per week. He used to smoke, quit 10 years ago. Lives with daughter in [place].  Recent admission from 6/28-7/1 for CHF. Discharge weight 167 lbs  ECHO EF 07/16/13 50-55% Grade 2 DD ECHO EF 06/01/14 EF 45-50% Grade 2 DD  Home Medications  Prior to Admission medications   Medication Sig Start Date End Date Taking? Authorizing Provider  albuterol (PROVENTIL HFA;VENTOLIN HFA) 108 (90 BASE) MCG/ACT inhaler Inhale 2 puffs into the lungs every 6 (six) hours as needed for wheezing or shortness of breath. 04/30/14   Thurnell Lose, MD  albuterol (PROVENTIL HFA;VENTOLIN HFA) 108 (90 BASE) MCG/ACT inhaler Inhale 2 puffs into the lungs every 6 (six) hours as needed for wheezing or shortness of breath. Patient not taking: Reported on 09/15/2014 09/08/14   Ripudeep Krystal Eaton, MD  amLODipine (NORVASC) 10 MG tablet  Take 1 tablet (10 mg total) by mouth daily. 09/18/14   Shanker Kristeen Mans, MD  aspirin EC 81 MG tablet Take 1 tablet (81 mg total) by mouth daily. 09/18/14   Shanker Kristeen Mans, MD  budesonide-formoterol (SYMBICORT) 80-4.5 MCG/ACT inhaler Inhale 2 puffs into the lungs 2 (two) times daily. 09/08/14   Ripudeep Krystal Eaton, MD  citalopram (CELEXA) 20 MG tablet Take 20 mg by mouth daily.    Historical Provider, MD  cloNIDine (CATAPRES) 0.3 MG tablet Take 1 tablet (0.3 mg total) by mouth 2 (two) times daily. 09/14/14   Jolaine Artist, MD  diclofenac sodium (VOLTAREN) 1 % GEL Apply 4 g topically 4 (four) times daily. 09/22/14   April Palumbo, MD  divalproex (DEPAKOTE ER) 500 MG 24 hr tablet Take 500 mg by mouth 2 (two) times daily. 05/29/14   Historical Provider, MD  ferrous sulfate 325 (65 FE) MG tablet Take 325 mg by mouth 2 (two) times daily with a meal.     Historical Provider, MD  hydrALAZINE (APRESOLINE) 100 MG tablet Take 100 mg by mouth 3 (three) times daily. 05/29/14   Historical Provider, MD  HYDROcodone-acetaminophen (NORCO/VICODIN) 5-325 MG per tablet Take 2 tablets by mouth every 6 (six) hours as needed for moderate pain. 09/08/14   Ripudeep Krystal Eaton, MD  isosorbide mononitrate (IMDUR) 30 MG 24  hr tablet Take 2 tablets (60 mg total) by mouth daily. 07/26/14   Modena Jansky, MD  nitroGLYCERIN (NITROSTAT) 0.4 MG SL tablet Place 1 tablet (0.4 mg total) under the tongue every 5 (five) minutes as needed for chest pain (upto max 3 doses at one time.). 07/26/14   Modena Jansky, MD  pantoprazole (PROTONIX) 40 MG tablet Take 40 mg by mouth daily.     Historical Provider, MD  potassium chloride SA (KLOR-CON M20) 20 MEQ tablet Take 20 mEq by mouth daily. 01/17/10   Historical Provider, MD  torsemide (DEMADEX) 20 MG tablet Take 2 tablets (40 mg total) by mouth 2 (two) times daily. 09/18/14   Shanker Kristeen Mans, MD    Family History  Family History  Problem Relation Age of Onset  . Heart disease Mother     unknown type  . Heart disease Father 68    died of MI at 45yo  . Heart disease Paternal Grandfather 96    died of MI  . Heart disease    . Heart disease Brother 62    Social History  History   Social History  . Marital Status: Widowed    Spouse Name: N/A  . Number of Children: 2  . Years of Education: N/A   Occupational History  . Unemployed     previously worked in [employer]  .     Social History Main Topics  . Smoking status: Former Smoker -- 0.25 packs/day for 10 years    Types: Cigarettes  . Smokeless tobacco: Never Used     Comment: "quit smoking cigarettes in 2009; smoked off and on since I was 13"  . Alcohol Use: 0.0 oz/week    0 Standard drinks or equivalent per week     Comment: 09/17/2014 "maybe 4 beers/month, 12 oz cans"  . Drug Use: 1.00 per week    Special: Cocaine, Marijuana     Comment: 09/17/2014 "it's been 2 wks since I last used cocaine"  . Sexual Activity: Yes     Comment: pt states "I smoked a little small piece on 02/25/14   Other Topics Concern  .  Not on file   Social History Narrative   Lives in [place]. Originally from [place], has been in the [place] since 1980s      **11/2013 - says that he lives with dtr in [facility].**                        Review of Systems General:  No chills, fever, night sweats or weight changes.  Cardiovascular:  See HPI  Dermatological: multiple old healing skin wounds Respiratory: No cough, dyspnea Urologic: No hematuria, dysuria Abdominal:   + nausea, vomiting, abd pain. No diarrhea, bright red blood per rectum, melena, or hematemesis Neurologic:  No visual changes, wkns, changes in mental status. All other systems reviewed and are otherwise negative except as noted above.  Physical Exam  Blood pressure 174/89, pulse 106, temperature 98.2 F (36.8 C), temperature source Oral, resp. rate 19, SpO2 96 %.  General: old, disheveled AA male with court ordered ankle bracelet  Psych: Normal affect. Neuro: Intermittent somnolence Moves all extremities spontaneously. HEENT: Normal  Neck: Supple without bruits or JVD. Lungs:  Resp regular and unlabored. R basilar crackles.  Heart: RRR no s3, s4, or murmurs. Abdomen: Epigastric TTP. + BS. No rebound or guarding.  Extremities: No clubbing, cyanosis or edema. Radials 2+ and equal bilaterally. DP/PT 1+  Labs  Troponin [facility] of Care Test)  Recent Labs  10/05/14 2218  TROPIPOC 0.40*    Recent Labs  10/05/14 2201  TROPONINI 0.46*   Lab Results  Component Value Date   WBC 5.8 10/05/2014   HGB 8.5* 10/05/2014   HCT 28.4* 10/05/2014   MCV 90.7 10/05/2014   PLT 363 10/05/2014    Recent Labs Lab 10/05/14 2201  NA 138  K 4.4  CL 105  CO2 24  BUN 28*  CREATININE 1.18  CALCIUM 8.8*  PROT 7.4  BILITOT 0.8  ALKPHOS 74  ALT 21  AST 64*  GLUCOSE 103*   Lab Results  Component Value Date   CHOL 88 10/15/2013   HDL 32* 10/15/2013   LDLCALC 42 10/15/2013   TRIG 69 10/15/2013   Lab Results  Component Value Date   DDIMER 0.81* 10/23/2013     Radiology/Studies  Dg Chest 2 View  09/15/2014   CLINICAL DATA:  69 year old male with a history of congestive heart failure. Four day history of lower extremity  swelling.  EXAM: CHEST - 2 VIEW  COMPARISON:  09/13/2014, 08/29/2014, 08/27/2014  FINDINGS: Cardiomediastinal silhouette unchanged, with persisting cardiomegaly.  Atherosclerosis.  Surgical changes of median sternotomy and coronary artery bypass grafting.  Slight improvement in aeration from the comparison study, with improved lung volumes and decreasing prominence of interlobular septal thickening and interstitial opacities.  No evidence of pleural effusion.  No pneumothorax.  No displaced fracture.  Unremarkable appearance of the upper abdomen.  IMPRESSION: Improving aeration with mild persisting pulmonary vascular congestion.  No pleural effusions or confluent airspace disease.  Cardiomegaly.  Signed,  Dulcy Fanny. Earleen Newport, DO  [facility]  [facility]   Electronically Signed   By: Corrie Mckusick D.O.   On: 09/15/2014 15:23   US Abdomen Limited  09/16/2014   CLINICAL DATA:  Abdominal pain.  History of cirrhosis.  EXAM: LIMITED ABDOMEN ULTRASOUND FOR ASCITES  TECHNIQUE: Limited ultrasound survey for ascites was performed in all four abdominal quadrants.  COMPARISON:  CT 07/22/2014.  Ultrasound 08/01/2014.  FINDINGS: No significant ascites identified on sonographic survey of 4  abdominal quadrants. Echogenic liver noted.  IMPRESSION: No evidence of recurrent ascites.   Electronically Signed   By: Richardean Sale M.D.   On: 09/16/2014 20:33   Dg Chest Port 1 View  10/05/2014   CLINICAL DATA:  69 year old male with chest pain  EXAM: PORTABLE CHEST - 1 VIEW  COMPARISON:  Radiograph dated 10/04/2014  FINDINGS: There is stable cardiomegaly with prominence of the central vasculature. An area of increased opacity at the left lower lung field may be related to an enlarged cardiac silhouette. Underlying pneumonia is not excluded. Clinical correlation is recommended. No significant pleural effusion or pneumothorax. Median sternotomy wires.  IMPRESSION: Stable cardiomegaly with  congestive changes.  Left lower lung field opacity may be related to atelectasis or pneumonia. Clinical correlation is recommended.   Electronically Signed   By: Anner Crete M.D.   On: 10/05/2014 22:51   Dg Abd Portable 1v  09/15/2014   CLINICAL DATA:  Abdominal pain and distention.  Constipation.  EXAM: PORTABLE ABDOMEN - 1 VIEW  COMPARISON:  07/03/2014 and CT scan abdomen dated 07/22/2014  FINDINGS: Chronic cardiomegaly. Bowel gas pattern is normal. Vascular calcifications throughout the abdomen including the renal arteries. Slight lumbar scoliosis. No excessive stool in the colon.  IMPRESSION: Benign appearing abdomen.   Electronically Signed   By: Lorriane Shire M.D.   On: 09/15/2014 20:23    ECG  10/05/14 @ 21:52: NSR, iRBBB, old IMI, possibly with posterior involvement. Diffuse T wave inversions with ST depression. Compared to prior on 09/17/14, the T wave abnormalites are now more pronounced and are now present in the anterior precordium. There is downsloping ST depression in most leads.    Posterior lead demonstrated absence of STE in V7-9  ASSESSMENT AND PLAN  Charles [name] is a 69 y.o. year old male with prior h/o cocaine abuse, GI bleed, diastolic CHF (EF 02-72% 5/36), CAD s/p CABG 2009, and stage 3 CKD, Myoview in December 2014 no ischemia and PAD. and frequent hospitalizations due to acute HF who presents with chest pain, abdominal pain, ECG changes, and positive troponin.   ECG changes, chest pain, and positive troponin are most likely due to ACS. Interestingly, these symptoms are not consistent with previous ischemic events, which have been characterized by feeling weak and woozy. He has marked abdominal pain and a mildly elevated lactate. AST mildly elevated, ALT and bilis normal. He is hypertensive and complaining of ongoing chest pain. He appears euvolemic on exam.   In this setting, it is prudent to rule out dissection (with a CT angio) which could lead to both myocardial and  bowel ischemia.  The prominent R' in V1 with new anterior TWI is consistent with right heart strain; a CT angio will also rule out PE.  Recommend: - rule out dissection and PE with CT angiogram - start IV NTG gtt for BP and CP - lipase to r/o pancreatitis - utox - if dissection and PE are ruled out, will initiate ACS therapy and plan for cardiac cath  Signed, Lamar Sprinkles, MD 10/05/2014, 11:49 PM  Addendum Utox positive for cocaine suggesting use has been more recent that Charles [name] has clained; he was given ativan on arrival so difficult to interpret positive BZD CT angiogram negative for PE or dissection Lipase is still pending  Would recommend the following therapies: - IV NTG gtt to achieve CP free and SBP<140 - ASA 81mg  daily - atorvastatin 80mg  - hold off on beta blocker given the theoretical risks in setting  of active cocaine use - IV UFH per pharmacy protocol - TTE - Lipids, A1c - cycle troponins - NPO for possible cardiac cath - continue home diuretics

## 2014-10-06 ENCOUNTER — Emergency Department (HOSPITAL_COMMUNITY): Payer: Medicare Other

## 2014-10-06 ENCOUNTER — Encounter (HOSPITAL_COMMUNITY): Payer: Self-pay | Admitting: Radiology

## 2014-10-06 DIAGNOSIS — R778 Other specified abnormalities of plasma proteins: Secondary | ICD-10-CM | POA: Diagnosis present

## 2014-10-06 DIAGNOSIS — G4733 Obstructive sleep apnea (adult) (pediatric): Secondary | ICD-10-CM | POA: Diagnosis present

## 2014-10-06 DIAGNOSIS — Z79891 Long term (current) use of opiate analgesic: Secondary | ICD-10-CM | POA: Diagnosis not present

## 2014-10-06 DIAGNOSIS — I5042 Chronic combined systolic (congestive) and diastolic (congestive) heart failure: Secondary | ICD-10-CM | POA: Diagnosis present

## 2014-10-06 DIAGNOSIS — F1410 Cocaine abuse, uncomplicated: Secondary | ICD-10-CM | POA: Diagnosis present

## 2014-10-06 DIAGNOSIS — R7989 Other specified abnormal findings of blood chemistry: Secondary | ICD-10-CM | POA: Diagnosis present

## 2014-10-06 DIAGNOSIS — R0789 Other chest pain: Secondary | ICD-10-CM

## 2014-10-06 DIAGNOSIS — R9431 Abnormal electrocardiogram [ECG] [EKG]: Secondary | ICD-10-CM | POA: Diagnosis not present

## 2014-10-06 DIAGNOSIS — I739 Peripheral vascular disease, unspecified: Secondary | ICD-10-CM | POA: Diagnosis present

## 2014-10-06 DIAGNOSIS — R1084 Generalized abdominal pain: Secondary | ICD-10-CM | POA: Diagnosis present

## 2014-10-06 DIAGNOSIS — Z886 Allergy status to analgesic agent status: Secondary | ICD-10-CM | POA: Diagnosis not present

## 2014-10-06 DIAGNOSIS — I255 Ischemic cardiomyopathy: Secondary | ICD-10-CM | POA: Diagnosis present

## 2014-10-06 DIAGNOSIS — I5032 Chronic diastolic (congestive) heart failure: Secondary | ICD-10-CM

## 2014-10-06 DIAGNOSIS — J449 Chronic obstructive pulmonary disease, unspecified: Secondary | ICD-10-CM | POA: Diagnosis present

## 2014-10-06 DIAGNOSIS — I272 Other secondary pulmonary hypertension: Secondary | ICD-10-CM | POA: Diagnosis present

## 2014-10-06 DIAGNOSIS — Z87891 Personal history of nicotine dependence: Secondary | ICD-10-CM | POA: Diagnosis not present

## 2014-10-06 DIAGNOSIS — Z951 Presence of aortocoronary bypass graft: Secondary | ICD-10-CM | POA: Diagnosis not present

## 2014-10-06 DIAGNOSIS — I129 Hypertensive chronic kidney disease with stage 1 through stage 4 chronic kidney disease, or unspecified chronic kidney disease: Secondary | ICD-10-CM | POA: Diagnosis present

## 2014-10-06 DIAGNOSIS — E785 Hyperlipidemia, unspecified: Secondary | ICD-10-CM | POA: Diagnosis present

## 2014-10-06 DIAGNOSIS — Z7982 Long term (current) use of aspirin: Secondary | ICD-10-CM | POA: Diagnosis not present

## 2014-10-06 DIAGNOSIS — Z590 Homelessness: Secondary | ICD-10-CM | POA: Diagnosis not present

## 2014-10-06 DIAGNOSIS — I712 Thoracic aortic aneurysm, without rupture: Secondary | ICD-10-CM | POA: Diagnosis present

## 2014-10-06 DIAGNOSIS — I2510 Atherosclerotic heart disease of native coronary artery without angina pectoris: Secondary | ICD-10-CM | POA: Diagnosis present

## 2014-10-06 DIAGNOSIS — F319 Bipolar disorder, unspecified: Secondary | ICD-10-CM | POA: Diagnosis present

## 2014-10-06 DIAGNOSIS — F209 Schizophrenia, unspecified: Secondary | ICD-10-CM | POA: Diagnosis present

## 2014-10-06 DIAGNOSIS — I701 Atherosclerosis of renal artery: Secondary | ICD-10-CM | POA: Diagnosis present

## 2014-10-06 DIAGNOSIS — I252 Old myocardial infarction: Secondary | ICD-10-CM | POA: Diagnosis not present

## 2014-10-06 DIAGNOSIS — N183 Chronic kidney disease, stage 3 (moderate): Secondary | ICD-10-CM | POA: Diagnosis present

## 2014-10-06 DIAGNOSIS — Q2733 Arteriovenous malformation of digestive system vessel: Secondary | ICD-10-CM | POA: Diagnosis not present

## 2014-10-06 DIAGNOSIS — I214 Non-ST elevation (NSTEMI) myocardial infarction: Secondary | ICD-10-CM | POA: Diagnosis not present

## 2014-10-06 DIAGNOSIS — D509 Iron deficiency anemia, unspecified: Secondary | ICD-10-CM | POA: Diagnosis present

## 2014-10-06 DIAGNOSIS — Z79899 Other long term (current) drug therapy: Secondary | ICD-10-CM | POA: Diagnosis not present

## 2014-10-06 DIAGNOSIS — G40909 Epilepsy, unspecified, not intractable, without status epilepticus: Secondary | ICD-10-CM | POA: Diagnosis present

## 2014-10-06 DIAGNOSIS — Z955 Presence of coronary angioplasty implant and graft: Secondary | ICD-10-CM | POA: Diagnosis not present

## 2014-10-06 DIAGNOSIS — I25119 Atherosclerotic heart disease of native coronary artery with unspecified angina pectoris: Secondary | ICD-10-CM | POA: Diagnosis present

## 2014-10-06 DIAGNOSIS — R109 Unspecified abdominal pain: Secondary | ICD-10-CM

## 2014-10-06 DIAGNOSIS — Z8249 Family history of ischemic heart disease and other diseases of the circulatory system: Secondary | ICD-10-CM | POA: Diagnosis not present

## 2014-10-06 LAB — RAPID URINE DRUG SCREEN, HOSP PERFORMED
Amphetamines: NOT DETECTED
Barbiturates: NOT DETECTED
Benzodiazepines: POSITIVE — AB
Cocaine: POSITIVE — AB
Opiates: NOT DETECTED
Tetrahydrocannabinol: NOT DETECTED

## 2014-10-06 LAB — URINALYSIS, ROUTINE W REFLEX MICROSCOPIC
BILIRUBIN URINE: NEGATIVE
GLUCOSE, UA: NEGATIVE mg/dL
Hgb urine dipstick: NEGATIVE
Ketones, ur: NEGATIVE mg/dL
Leukocytes, UA: NEGATIVE
Nitrite: NEGATIVE
Protein, ur: 100 mg/dL — AB
Specific Gravity, Urine: 1.02 (ref 1.005–1.030)
Urobilinogen, UA: 1 mg/dL (ref 0.0–1.0)
pH: 5.5 (ref 5.0–8.0)

## 2014-10-06 LAB — BASIC METABOLIC PANEL
Anion gap: 8 (ref 5–15)
BUN: 22 mg/dL — AB (ref 6–20)
CALCIUM: 8.4 mg/dL — AB (ref 8.9–10.3)
CHLORIDE: 105 mmol/L (ref 101–111)
CO2: 24 mmol/L (ref 22–32)
Creatinine, Ser: 1.05 mg/dL (ref 0.61–1.24)
GFR calc non Af Amer: >60 mL/min (ref >60)
Glucose, Bld: 102 mg/dL — ABNORMAL HIGH (ref 65–99)
Potassium: 4 mmol/L (ref 3.5–5.1)
SODIUM: 137 mmol/L (ref 135–145)

## 2014-10-06 LAB — HEPATIC FUNCTION PANEL
ALT: 17 U/L (ref 17–63)
AST: 40 U/L (ref 15–41)
Albumin: 2.9 g/dL — ABNORMAL LOW (ref 3.5–5.0)
Alkaline Phosphatase: 56 U/L (ref 38–126)
Bilirubin, Direct: 0.3 mg/dL (ref 0.1–0.5)
Indirect Bilirubin: 0.5 mg/dL (ref 0.3–0.9)
Total Bilirubin: 0.8 mg/dL (ref 0.3–1.2)
Total Protein: 6.5 g/dL (ref 6.5–8.1)

## 2014-10-06 LAB — TROPONIN I
TROPONIN I: 0.24 ng/mL — AB (ref <0.031)
TROPONIN I: 0.34 ng/mL — AB (ref <0.031)
Troponin I: 0.27 ng/mL — ABNORMAL HIGH (ref <0.031)

## 2014-10-06 LAB — MRSA PCR SCREENING: MRSA by PCR: POSITIVE — AB

## 2014-10-06 LAB — GLUCOSE, CAPILLARY
Glucose-Capillary: 100 mg/dL — ABNORMAL HIGH (ref 65–99)
Glucose-Capillary: 97 mg/dL (ref 65–99)

## 2014-10-06 LAB — LIPASE, BLOOD
LIPASE: 26 U/L (ref 22–51)
Lipase: 27 U/L (ref 22–51)

## 2014-10-06 LAB — CBC WITH DIFFERENTIAL/PLATELET
BASOS ABS: 0 10*3/uL (ref 0.0–0.1)
Basophils Relative: 1 % (ref 0–1)
EOS ABS: 0 10*3/uL (ref 0.0–0.7)
Eosinophils Relative: 1 % (ref 0–5)
HCT: 24.7 % — ABNORMAL LOW (ref 39.0–52.0)
HEMOGLOBIN: 7.6 g/dL — AB (ref 13.0–17.0)
Lymphocytes Relative: 19 % (ref 12–46)
Lymphs Abs: 0.8 10*3/uL (ref 0.7–4.0)
MCH: 27.8 pg (ref 26.0–34.0)
MCHC: 30.8 g/dL (ref 30.0–36.0)
MCV: 90.5 fL (ref 78.0–100.0)
MONOS PCT: 9 % (ref 3–12)
Monocytes Absolute: 0.4 10*3/uL (ref 0.1–1.0)
NEUTROS ABS: 2.8 10*3/uL (ref 1.7–7.7)
Neutrophils Relative %: 70 % (ref 43–77)
PLATELETS: 325 10*3/uL (ref 150–400)
RBC: 2.73 MIL/uL — ABNORMAL LOW (ref 4.22–5.81)
RDW: 18.8 % — ABNORMAL HIGH (ref 11.5–15.5)
WBC: 4 10*3/uL (ref 4.0–10.5)

## 2014-10-06 LAB — TYPE AND SCREEN
ABO/RH(D): O POS
Antibody Screen: NEGATIVE

## 2014-10-06 LAB — URINE MICROSCOPIC-ADD ON

## 2014-10-06 LAB — VALPROIC ACID LEVEL: Valproic Acid Lvl: 10 ug/mL — ABNORMAL LOW (ref 50.0–100.0)

## 2014-10-06 MED ORDER — BUDESONIDE-FORMOTEROL FUMARATE 80-4.5 MCG/ACT IN AERO
2.0000 | INHALATION_SPRAY | Freq: Two times a day (BID) | RESPIRATORY_TRACT | Status: DC
  Administered 2014-10-06 – 2014-10-07 (×3): 2 via RESPIRATORY_TRACT
  Filled 2014-10-06: qty 6.9

## 2014-10-06 MED ORDER — ASPIRIN EC 81 MG PO TBEC
81.0000 mg | DELAYED_RELEASE_TABLET | Freq: Every day | ORAL | Status: DC
  Administered 2014-10-06 – 2014-10-07 (×2): 81 mg via ORAL
  Filled 2014-10-06: qty 1

## 2014-10-06 MED ORDER — POTASSIUM CHLORIDE CRYS ER 20 MEQ PO TBCR
20.0000 meq | EXTENDED_RELEASE_TABLET | Freq: Every day | ORAL | Status: DC
  Administered 2014-10-06 – 2014-10-07 (×2): 20 meq via ORAL
  Filled 2014-10-06 (×2): qty 1

## 2014-10-06 MED ORDER — DIVALPROEX SODIUM ER 500 MG PO TB24
500.0000 mg | ORAL_TABLET | Freq: Two times a day (BID) | ORAL | Status: DC
  Administered 2014-10-06 – 2014-10-07 (×3): 500 mg via ORAL
  Filled 2014-10-06 (×4): qty 1

## 2014-10-06 MED ORDER — ALBUTEROL SULFATE (2.5 MG/3ML) 0.083% IN NEBU: 3.0000 mL | INHALATION_SOLUTION | Freq: Four times a day (QID) | RESPIRATORY_TRACT | Status: DC | PRN

## 2014-10-06 MED ORDER — ONDANSETRON HCL 4 MG PO TABS
4.0000 mg | ORAL_TABLET | Freq: Four times a day (QID) | ORAL | Status: DC | PRN
  Administered 2014-10-06: 4 mg via ORAL
  Filled 2014-10-06: qty 1

## 2014-10-06 MED ORDER — ISOSORBIDE MONONITRATE ER 60 MG PO TB24
60.0000 mg | ORAL_TABLET | Freq: Every day | ORAL | Status: DC
  Administered 2014-10-06: 60 mg via ORAL
  Filled 2014-10-06: qty 1

## 2014-10-06 MED ORDER — HEPARIN BOLUS VIA INFUSION
2000.0000 [IU] | Freq: Once | INTRAVENOUS | Status: AC
  Administered 2014-10-06: 2000 [IU] via INTRAVENOUS
  Filled 2014-10-06: qty 2000

## 2014-10-06 MED ORDER — SODIUM CHLORIDE 0.9 % IJ SOLN
3.0000 mL | Freq: Two times a day (BID) | INTRAMUSCULAR | Status: DC
  Administered 2014-10-06 – 2014-10-07 (×2): 3 mL via INTRAVENOUS

## 2014-10-06 MED ORDER — ONDANSETRON HCL 4 MG/2ML IJ SOLN: 4.0000 mg | Freq: Four times a day (QID) | INTRAMUSCULAR | Status: DC | PRN

## 2014-10-06 MED ORDER — ENOXAPARIN SODIUM 40 MG/0.4ML ~~LOC~~ SOLN: 40.0000 mg | SUBCUTANEOUS | Status: DC

## 2014-10-06 MED ORDER — AMLODIPINE BESYLATE 10 MG PO TABS
10.0000 mg | ORAL_TABLET | Freq: Every day | ORAL | Status: DC
  Administered 2014-10-06 – 2014-10-07 (×2): 10 mg via ORAL
  Filled 2014-10-06 (×2): qty 1

## 2014-10-06 MED ORDER — MORPHINE SULFATE 2 MG/ML IJ SOLN
1.0000 mg | INTRAMUSCULAR | Status: DC | PRN
  Administered 2014-10-06 – 2014-10-07 (×5): 1 mg via INTRAVENOUS
  Filled 2014-10-06 (×5): qty 1

## 2014-10-06 MED ORDER — CITALOPRAM HYDROBROMIDE 20 MG PO TABS
20.0000 mg | ORAL_TABLET | Freq: Every day | ORAL | Status: DC
  Administered 2014-10-06 – 2014-10-07 (×2): 20 mg via ORAL
  Filled 2014-10-06 (×2): qty 1

## 2014-10-06 MED ORDER — CLONIDINE HCL 0.3 MG PO TABS
0.3000 mg | ORAL_TABLET | Freq: Two times a day (BID) | ORAL | Status: DC
  Administered 2014-10-06 – 2014-10-07 (×2): 0.3 mg via ORAL
  Filled 2014-10-06 (×3): qty 1

## 2014-10-06 MED ORDER — HYDRALAZINE HCL 50 MG PO TABS
100.0000 mg | ORAL_TABLET | Freq: Three times a day (TID) | ORAL | Status: DC
  Administered 2014-10-06 – 2014-10-07 (×3): 100 mg via ORAL
  Filled 2014-10-06 (×4): qty 2

## 2014-10-06 MED ORDER — IOHEXOL 350 MG/ML SOLN
100.0000 mL | Freq: Once | INTRAVENOUS | Status: AC | PRN
  Administered 2014-10-06: 100 mL via INTRAVENOUS

## 2014-10-06 MED ORDER — NITROGLYCERIN IN D5W 200-5 MCG/ML-% IV SOLN
10.0000 ug/min | INTRAVENOUS | Status: DC
  Administered 2014-10-06: 20 ug/min via INTRAVENOUS
  Filled 2014-10-06: qty 250

## 2014-10-06 MED ORDER — ISOSORBIDE MONONITRATE ER 60 MG PO TB24
90.0000 mg | ORAL_TABLET | Freq: Every day | ORAL | Status: DC
  Administered 2014-10-07: 90 mg via ORAL
  Filled 2014-10-06 (×2): qty 1

## 2014-10-06 MED ORDER — PANTOPRAZOLE SODIUM 40 MG PO TBEC
40.0000 mg | DELAYED_RELEASE_TABLET | Freq: Every day | ORAL | Status: DC
  Administered 2014-10-06 – 2014-10-07 (×2): 40 mg via ORAL
  Filled 2014-10-06 (×2): qty 1

## 2014-10-06 MED ORDER — TORSEMIDE 20 MG PO TABS
40.0000 mg | ORAL_TABLET | Freq: Two times a day (BID) | ORAL | Status: DC
  Administered 2014-10-06 – 2014-10-07 (×3): 40 mg via ORAL
  Filled 2014-10-06 (×3): qty 2

## 2014-10-06 MED ORDER — ATORVASTATIN CALCIUM 80 MG PO TABS
80.0000 mg | ORAL_TABLET | Freq: Every day | ORAL | Status: DC
Start: 2014-10-06 — End: 2014-10-07
  Administered 2014-10-06: 80 mg via ORAL
  Filled 2014-10-06: qty 1

## 2014-10-06 MED ORDER — FERROUS SULFATE 325 (65 FE) MG PO TABS
325.0000 mg | ORAL_TABLET | Freq: Two times a day (BID) | ORAL | Status: DC
  Administered 2014-10-06 – 2014-10-07 (×3): 325 mg via ORAL
  Filled 2014-10-06 (×3): qty 1

## 2014-10-06 MED ORDER — HEPARIN (PORCINE) IN NACL 100-0.45 UNIT/ML-% IJ SOLN
900.0000 [IU]/h | INTRAMUSCULAR | Status: DC
  Administered 2014-10-06: 900 [IU]/h via INTRAVENOUS
  Filled 2014-10-06: qty 250

## 2014-10-06 NOTE — Care Management Note (Addendum)
Case Management Note  Patient Details  Name: Charles [name] MRN: [id number] Date of Birth: October 15, 1945  Subjective/Objective:     Pt admitted for cp- Nstemi and cocaine abuse.               Action/Plan: CM received consult for medication assistance. At this time, CM will not be able to assist with medications due to pt has insurance. CSW to provide resources for substance abuse. No further needs identified by CM at this time.   Expected Discharge Date:                  Expected Discharge Plan:  Home/Self Care  In-House Referral:  Clinical Social Work  Discharge planning Services  CM Consult  Post Acute Care Choice:  NA Choice offered to:  NA  DME Arranged:  N/A DME Agency:  NA  HH Arranged:  NA HH Agency:  NA  Status of Service:  Completed, signed off  Medicare Important Message Given:    Date Medicare IM Given:    Medicare IM give by:    Date Additional Medicare IM Given:    Additional Medicare Important Message give by:     If discussed at [place] of Stay Meetings, dates discussed:    Additional Comments: 1014 10-07-14 Jacqlyn Krauss, RN,BSN [phone] CM did provide pt with [facility] Form for PCP needs. No further needs identified by CM at this time.   Bethena Roys, RN 10/06/2014, 2:27 PM

## 2014-10-06 NOTE — Progress Notes (Signed)
Patient seen and examined, admitted by Dr. Hal Hope this morning.  Briefly 69 year old male with CAD, CABG, chronic combined systolic and diastolic CHF, EF 71-95%, CKD, ongoing cocaine abuse presented with chest pain. CT angiogram of the chest and abdomen showed ascending aortic aneurysm and troponins were mildly positive. Patient was admitted for NSTEMI. BP 172/94 mmHg  Pulse 102  Temp(Src) 98.3 F (36.8 C) (Oral)  Resp 24  Ht 5\' 5"  (1.651 m)  SpO2 92%    Agree with current assessment and plan per Dr. Hal Hope NSTEMI/CHEST PAIN - Currently on IV nitroglycerin drip, continue aspirin, not on beta blockers due to cocaine use, add Lipitor - Cardiology consulted, recommended to keep nothing by mouth for possible cardiac  Cocaine abuse Counseled on cocaine cessation  Uncontrolled hypertension Currently on nitroglycerin drip, restarted amlodipine, clonidine, hydralazine  CK D stage III Currently creatinine stable at baseline, follow  Ascending aortic aneurysm Will need outpatient follow up closely  Will follow   Ashwini Jago M.D. [facility] 10/06/2014, 10:58 AM  Pager: [phone]

## 2014-10-06 NOTE — Progress Notes (Signed)
Utilization review completed. Denae Zulueta, RN, BSN. 

## 2014-10-06 NOTE — ED Notes (Signed)
Admitting at bedside 

## 2014-10-06 NOTE — H&P (Addendum)
[facility] History and Physical  Charles [name] UXN:[id number] DOB: Jun 20, 1945 DOA: 10/05/2014  Referring physician: Dr.Nanavati. PCP: No PCP Per Patient  Specialists: Dr.Bensimon.  Chief Complaint: Chest pain.  HPI: Charles [name] is a 69 y.o. male with history of CAD status post CABG, chronic combined systolic and diastolic CHF last measured was 45-50%, chronic kidney disease, possible cirrhosis, ongoing cocaine abuse presents to the ER because of chest pain. Patient's chest pain started last evening around 4 PM. Pain is mostly on the left anterior chest wall nonradiating pressure-like. Patient in addition also has been having some abdominal discomfort. CT angiogram of the chest and abdomen shows ascending aortic aneurysm. Patient's troponin is mildly positive at on-call cardiologist Dr. Tommi Rumps has been consulted patient has been started on IV nitroglycerin infusion. Patient will be admitted for further management of non-ST elevation MI. Patient's urine drug screen is positive for cocaine.   Review of Systems: As presented in the history of presenting illness, rest negative.  Past Medical History  Diagnosis Date  . Iron deficiency anemia   . Chronic combined systolic and diastolic CHF, NYHA class 2     a. 11/2013 Echo: EF 40-45%, basl-mid inflat AK, Gr 2 DD.  [name] Hyperlipidemia LDL goal <70   . Ischemic cardiomyopathy     a. 11/2013 Echo: EF 40-45%, basl-mid inflat AK, Gr 2 DD, mild AI, mod dil LA/RA, mod reduced RV fxn, PASP 32mmHg.  . Moderate to severe pulmonary hypertension 03/2010    a. PA peak pressure of 76 mmHg (per 2D Echo 03/2010)  . Polysubstance abuse     a. cocaine, THC  . AV malformation of gastrointestinal tract     a. w/ h/o GIB.  [name] Family history of early CAD   . Peripheral vascular disease   . Seizure disorder   . Hypertension   . CAD (coronary artery disease)     a. s/p 3-vessel CABG (12/2007) // 100% RCA stenosis with collaterals from left  system. Severe bifurcation lesions of proxima CXA and OM. Moderate LAD disease - followed by Dr. Wyline Copas in [facility];  b. 11/2013 Myoview: Large fixed inf defect w/o ischemia, EF 35%.  . Chronic kidney disease (CKD), stage III (moderate)     a. BL SCr 1.5-1.6  . Renal artery stenosis   . Cancer     "I have cancer; right now it's not known what kind" (09/17/2014)  . Myocardial infarction 08/2007; 12/2007  . Anginal pain   . COPD (chronic obstructive pulmonary disease)   . Pneumonia     "I've had pneumonia once" (09/17/2014)  . Obstructive sleep apnea     "never told me to wear mask" (09/17/2014)  . History of blood transfusion "I've had quite a few"    "they don't know where it's coming from" (09/17/2014)  . History of hiatal hernia   . Seizures     "I've had a few; don't know what kind they call it" (09/17/2014)  . DDD (degenerative disc disease), lumbar   . Arthritis     "ankles; left hip; wrists" (09/17/2014)  . Chronic lower back pain   . Depression   . Bipolar disorder   . Schizophrenia    Past Surgical History  Procedure Laterality Date  . Apc  03/2010    To treat small bowel AVMs  . Esophagogastroduodenoscopy N/A 08/14/2012    Procedure: ESOPHAGOGASTRODUODENOSCOPY (EGD);  Surgeon: [name], MD;  Location: [facility] ENDOSCOPY;  Service: Endoscopy;  Laterality: N/A;  . Hot  hemostasis N/A 08/14/2012    Procedure: HOT HEMOSTASIS (ARGON PLASMA COAGULATION/BICAP);  Surgeon: [name], MD;  Location: [facility] ENDOSCOPY;  Service: Endoscopy;  Laterality: N/A;  . Esophagogastroduodenoscopy (egd) with propofol N/A 08/04/2013    Procedure: ESOPHAGOGASTRODUODENOSCOPY (EGD) WITH PROPOFOL;  Surgeon: [name], MD;  Location: [facility] ENDOSCOPY;  Service: Endoscopy;  Laterality: N/A;  . Esophagogastroduodenoscopy (egd) with propofol N/A 06/08/2014    Procedure: ESOPHAGOGASTRODUODENOSCOPY (EGD) WITH PROPOFOL;  Surgeon: Milus Banister, MD;  Location: [place];  Service: Endoscopy;  Laterality: N/A;  .  Peripheral vascular catheterization N/A 09/07/2014    Procedure: Abdominal Aortogram;  Surgeon: Angelia Mould, MD;  Location: [place] [facility];  Service: Cardiovascular;  Laterality: N/A;  . Coronary artery bypass graft  12/2007    "CABG X3"  . Coronary angioplasty with stent placement  08/2007   Social History:  reports that he has quit smoking. His smoking use included Cigarettes. He has a 2.5 pack-year smoking history. He has never used smokeless tobacco. He reports that he drinks alcohol. He reports that he uses illicit drugs (Cocaine and Marijuana) about once per week. Where does patient live home. Can patient participate in ADLs? Yes.  Allergies  Allergen Reactions  . Motrin [Ibuprofen] Other (See Comments)    Affects kidneys  . Tylenol [Acetaminophen] Other (See Comments)    Affects kidneys    Family History:  Family History  Problem Relation Age of Onset  . Heart disease Mother     unknown type  . Heart disease Father 2    died of MI at 36yo  . Heart disease Paternal Grandfather 83    died of MI  . Heart disease    . Heart disease Brother 30      Prior to Admission medications   Medication Sig Start Date End Date Taking? Authorizing Provider  albuterol (PROVENTIL HFA;VENTOLIN HFA) 108 (90 BASE) MCG/ACT inhaler Inhale 2 puffs into the lungs every 6 (six) hours as needed for wheezing or shortness of breath. 04/30/14  Yes Thurnell Lose, MD  amLODipine (NORVASC) 10 MG tablet Take 1 tablet (10 mg total) by mouth daily. 09/18/14  Yes Shanker Kristeen Mans, MD  aspirin EC 81 MG tablet Take 1 tablet (81 mg total) by mouth daily. 09/18/14  Yes Shanker Kristeen Mans, MD  budesonide-formoterol (SYMBICORT) 80-4.5 MCG/ACT inhaler Inhale 2 puffs into the lungs 2 (two) times daily. 09/08/14  Yes Ripudeep Krystal Eaton, MD  citalopram (CELEXA) 20 MG tablet Take 20 mg by mouth daily.   Yes Historical Provider, MD  cloNIDine (CATAPRES) 0.3 MG tablet Take 1 tablet (0.3 mg total) by mouth 2 (two)  times daily. 09/14/14  Yes Jolaine Artist, MD  diclofenac sodium (VOLTAREN) 1 % GEL Apply 4 g topically 4 (four) times daily. 09/22/14  Yes April Palumbo, MD  divalproex (DEPAKOTE ER) 500 MG 24 hr tablet Take 500 mg by mouth 2 (two) times daily. 05/29/14  Yes Historical Provider, MD  ferrous sulfate 325 (65 FE) MG tablet Take 325 mg by mouth 2 (two) times daily with a meal.    Yes Historical Provider, MD  hydrALAZINE (APRESOLINE) 100 MG tablet Take 100 mg by mouth 3 (three) times daily. 05/29/14  Yes Historical Provider, MD  HYDROcodone-acetaminophen (NORCO/VICODIN) 5-325 MG per tablet Take 2 tablets by mouth every 6 (six) hours as needed for moderate pain. 09/08/14  Yes Ripudeep Krystal Eaton, MD  isosorbide mononitrate (IMDUR) 30 MG 24 hr tablet Take 2 tablets (60 mg total)  by mouth daily. 07/26/14  Yes Modena Jansky, MD  nitroGLYCERIN (NITROSTAT) 0.4 MG SL tablet Place 1 tablet (0.4 mg total) under the tongue every 5 (five) minutes as needed for chest pain (upto max 3 doses at one time.). 07/26/14  Yes Modena Jansky, MD  pantoprazole (PROTONIX) 40 MG tablet Take 40 mg by mouth daily.    Yes Historical Provider, MD  potassium chloride SA (KLOR-CON M20) 20 MEQ tablet Take 20 mEq by mouth daily. 01/17/10  Yes Historical Provider, MD  torsemide (DEMADEX) 20 MG tablet Take 2 tablets (40 mg total) by mouth 2 (two) times daily. 09/18/14  Yes Shanker Kristeen Mans, MD  albuterol (PROVENTIL HFA;VENTOLIN HFA) 108 (90 BASE) MCG/ACT inhaler Inhale 2 puffs into the lungs every 6 (six) hours as needed for wheezing or shortness of breath. Patient not taking: Reported on 09/15/2014 09/08/14   Ripudeep Krystal Eaton, MD    Physical Exam: Filed Vitals:   10/06/14 0345 10/06/14 0415 10/06/14 0430 10/06/14 0445  BP: 162/74 151/92 154/86 165/97  Pulse: 104 105 103 102  Temp:      TempSrc:      Resp:  21 14   SpO2: 97% 96% 98% 94%     General:  Moderately built and nourished.  Eyes: Anicteric no pallor.  ENT: No discharge from  the ears eyes nose and mouth.  Neck: No mass felt.  Cardiovascular: S1-S2 heard.  Respiratory: No rhonchi or crepitations.  Abdomen: Soft nontender bowel sounds present.  Skin: No rash.  Musculoskeletal: No edema.  Psychiatric: Appears normal.  Neurologic: Alert oriented to time place and person. Moves all extremities.  Labs on Admission:  Basic Metabolic Panel:  Recent Labs Lab 10/05/14 2201  NA 138  K 4.4  CL 105  CO2 24  GLUCOSE 103*  BUN 28*  CREATININE 1.18  CALCIUM 8.8*   Liver Function Tests:  Recent Labs Lab 10/05/14 2201  AST 64*  ALT 21  ALKPHOS 74  BILITOT 0.8  PROT 7.4  ALBUMIN 3.3*    Recent Labs Lab 10/06/14 0223  LIPASE 26   No results for input(s): AMMONIA in the last 168 hours. CBC:  Recent Labs Lab 10/05/14 2201  WBC 5.8  NEUTROABS 4.4  HGB 8.5*  HCT 28.4*  MCV 90.7  PLT 363   Cardiac Enzymes:  Recent Labs Lab 10/05/14 2201 10/06/14 0223  TROPONINI 0.46* 0.24*    BNP (last 3 results)  Recent Labs  09/14/14 1212 09/15/14 1430 10/05/14 2201  BNP 3617.4* 3756.9* 748.5*    ProBNP (last 3 results)  Recent Labs  01/01/14 1107 02/07/14 0123 03/01/14 2250  PROBNP 3225.0* 2648.0* 7421.0*    CBG: No results for input(s): GLUCAP in the last 168 hours.  Radiological Exams on Admission: Ct Angio Chest Pe W/cm &/or Wo Cm  10/06/2014   CLINICAL DATA:  Acute onset of upper back pain and epigastric abdominal pain. Assess for pulmonary embolus. Initial encounter.  EXAM: CT ANGIOGRAPHY CHEST  CT ABDOMEN AND PELVIS WITH CONTRAST  TECHNIQUE: Multidetector CT imaging of the chest was performed using the standard protocol during bolus administration of intravenous contrast. Multiplanar CT image reconstructions and MIPs were obtained to evaluate the vascular anatomy. Multidetector CT imaging of the abdomen and pelvis was performed using the standard protocol during bolus administration of intravenous contrast.  CONTRAST:   165mL OMNIPAQUE IOHEXOL 350 MG/ML SOLN  COMPARISON:  Chest radiograph performed 10/05/2014, CTA of the chest performed 04/26/2014, and CT of the abdomen and  pelvis from 07/22/2014  FINDINGS: CTA CHEST FINDINGS  There is no evidence of pulmonary embolus.  Mild bilateral interstitial prominence may reflect mild interstitial lung disease or possibly minimal interstitial edema. There is no evidence of pleural effusion or pneumothorax. No masses are identified; no abnormal focal contrast enhancement is seen.  There is dilatation of the ascending thoracic aorta to 4.6 cm in maximal AP dimension. Diffuse coronary artery calcifications are seen. Cardiomegaly is noted. Visualized mediastinal nodes remain normal in size. The patient is status post median sternotomy. No pericardial effusion is identified. The great vessels are grossly unremarkable in appearance. No axillary lymphadenopathy is seen. The visualized portions of the thyroid gland are unremarkable in appearance.  No acute osseous abnormalities are seen.  CT ABDOMEN and PELVIS FINDINGS  The liver and spleen are unremarkable in appearance. The gallbladder is within normal limits. The pancreas and adrenal glands are unremarkable.  A 1.3 cm cyst is noted at the interpole region of the left kidney. Nonspecific perinephric stranding is noted bilaterally. Calcifications are seen in both renal hila, vascular in nature. There is no evidence of hydronephrosis. No renal or ureteral stones are seen. No perinephric stranding is appreciated.  No free fluid is identified. The small bowel is unremarkable in appearance. The stomach is within normal limits. No acute vascular abnormalities are seen.  Diffuse calcification is noted along the abdominal aorta and its branches, with likely severe luminal narrowing noted at the proximal common iliac arteries bilaterally.  The appendix is normal in caliber and contains air, without evidence of appendicitis. The colon is unremarkable in  appearance.  The bladder is mildly distended and grossly unremarkable. The prostate remains normal in size. No inguinal lymphadenopathy is seen.  No acute osseous abnormalities are identified. Vacuum phenomenon is noted along the lower lumbar spine.  Review of the MIP images confirms the above findings.  IMPRESSION: 1. No evidence of pulmonary embolus. 2. Mild bilateral interstitial prominence may reflect mild interstitial lung disease or possibly minimal interstitial edema. 3. 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 Recommend semi-annual imaging followup by CTA or MRA and referral to cardiothoracic surgery if not already obtained. This recommendation follows 2010 ACCF/AHA/AATS/ACR/ASA/SCA/SCAI/SIR/STS/SVM Guidelines for the Diagnosis and Management of Patients With Thoracic Aortic Disease. Circulation. 2010; 121: D983-J825 4. Small left renal cyst noted. 5. Diffuse calcification along the abdominal aorta and its branches, with likely severe luminal narrowing at the proximal common iliac arteries bilaterally.   Electronically Signed   By: Garald Balding M.D.   On: 10/06/2014 02:55   Ct Abdomen Pelvis W Contrast  10/06/2014   CLINICAL DATA:  Acute onset of upper back pain and epigastric abdominal pain. Assess for pulmonary embolus. Initial encounter.  EXAM: CT ANGIOGRAPHY CHEST  CT ABDOMEN AND PELVIS WITH CONTRAST  TECHNIQUE: Multidetector CT imaging of the chest was performed using the standard protocol during bolus administration of intravenous contrast. Multiplanar CT image reconstructions and MIPs were obtained to evaluate the vascular anatomy. Multidetector CT imaging of the abdomen and pelvis was performed using the standard protocol during bolus administration of intravenous contrast.  CONTRAST:  135mL OMNIPAQUE IOHEXOL 350 MG/ML SOLN  COMPARISON:  Chest radiograph performed 10/05/2014, CTA of the chest performed 04/26/2014, and CT of the abdomen and pelvis from  07/22/2014  FINDINGS: CTA CHEST FINDINGS  There is no evidence of pulmonary embolus.  Mild bilateral interstitial prominence may reflect mild interstitial lung disease or possibly minimal interstitial edema. There is no  evidence of pleural effusion or pneumothorax. No masses are identified; no abnormal focal contrast enhancement is seen.  There is dilatation of the ascending thoracic aorta to 4.6 cm in maximal AP dimension. Diffuse coronary artery calcifications are seen. Cardiomegaly is noted. Visualized mediastinal nodes remain normal in size. The patient is status post median sternotomy. No pericardial effusion is identified. The great vessels are grossly unremarkable in appearance. No axillary lymphadenopathy is seen. The visualized portions of the thyroid gland are unremarkable in appearance.  No acute osseous abnormalities are seen.  CT ABDOMEN and PELVIS FINDINGS  The liver and spleen are unremarkable in appearance. The gallbladder is within normal limits. The pancreas and adrenal glands are unremarkable.  A 1.3 cm cyst is noted at the interpole region of the left kidney. Nonspecific perinephric stranding is noted bilaterally. Calcifications are seen in both renal hila, vascular in nature. There is no evidence of hydronephrosis. No renal or ureteral stones are seen. No perinephric stranding is appreciated.  No free fluid is identified. The small bowel is unremarkable in appearance. The stomach is within normal limits. No acute vascular abnormalities are seen.  Diffuse calcification is noted along the abdominal aorta and its branches, with likely severe luminal narrowing noted at the proximal common iliac arteries bilaterally.  The appendix is normal in caliber and contains air, without evidence of appendicitis. The colon is unremarkable in appearance.  The bladder is mildly distended and grossly unremarkable. The prostate remains normal in size. No inguinal lymphadenopathy is seen.  No acute osseous  abnormalities are identified. Vacuum phenomenon is noted along the lower lumbar spine.  Review of the MIP images confirms the above findings.  IMPRESSION: 1. No evidence of pulmonary embolus. 2. Mild bilateral interstitial prominence may reflect mild interstitial lung disease or possibly minimal interstitial edema. 3. 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 Recommend semi-annual imaging followup by CTA or MRA and referral to cardiothoracic surgery if not already obtained. This recommendation follows 2010 ACCF/AHA/AATS/ACR/ASA/SCA/SCAI/SIR/STS/SVM Guidelines for the Diagnosis and Management of Patients With Thoracic Aortic Disease. Circulation. 2010; 121: G665-L935 4. Small left renal cyst noted. 5. Diffuse calcification along the abdominal aorta and its branches, with likely severe luminal narrowing at the proximal common iliac arteries bilaterally.   Electronically Signed   By: Garald Balding M.D.   On: 10/06/2014 02:55   Dg Chest Port 1 View  10/05/2014   CLINICAL DATA:  69 year old male with chest pain  EXAM: PORTABLE CHEST - 1 VIEW  COMPARISON:  Radiograph dated 10/04/2014  FINDINGS: There is stable cardiomegaly with prominence of the central vasculature. An area of increased opacity at the left lower lung field may be related to an enlarged cardiac silhouette. Underlying pneumonia is not excluded. Clinical correlation is recommended. No significant pleural effusion or pneumothorax. Median sternotomy wires.  IMPRESSION: Stable cardiomegaly with congestive changes.  Left lower lung field opacity may be related to atelectasis or pneumonia. Clinical correlation is recommended.   Electronically Signed   By: Anner Crete M.D.   On: 10/05/2014 22:51    EKG: Independently reviewed. Sinus tachycardia with RBBB.  Assessment/Plan Principal Problem:   NSTEMI (non-ST elevated myocardial infarction) Active Problems:   Chronic kidney disease (CKD), stage III (moderate)    Cocaine abuse   Chronic diastolic heart failure   Non-ST elevation MI (NSTEMI)   1. Non-ST elevation MI - appreciate cardiology consult. Patient has been placed on IV nitroglycerin infusion. Aspirin. Not on beta blockers secondary  to cocaine. Not start heparin infusion as patient has history of gastric AVMs and patient has significant anemia. Cycle cardiac markers. Patient is kept nothing by mouth in anticipation of cardiac procedure. 2. Cocaine abuse - patient will need counseling. 3. Hypertension uncontrolled - patient is presently on IV nitroglycerin infusion and continue patient's home medications. Closely follow blood pressure trends. 4. Chronic diastolic CHF last EF measured as 45-50% - appears compensated. Continue diabetics. 5. Chronic anemia - follow CBC. If there is any further worsening of hemoglobin may need transfusion. 6. Abdominal pain - CT abdomen is unremarkable. 7. Chronic kidney disease stage III - closely follow metabolic panel. 8. Ascending Aortic Aneurysm - outpatient follow up.   DVT Prophylaxis Lovenox.  Code Status: Full code.  Family Communication: Discussed with patient.  Disposition Plan: Admit to inpatient.    Tymber Stallings N. [facility] Pager [phone].  If 7PM-7AM, please contact night-coverage [email or web address] Password [facility] 10/06/2014, 5:31 AM

## 2014-10-06 NOTE — Progress Notes (Addendum)
ANTICOAGULATION CONSULT NOTE - Initial Consult  Pharmacy Consult for heparin  Indication: chest pain/ACS  Allergies  Allergen Reactions  . Motrin [Ibuprofen] Other (See Comments)    Affects kidneys  . Tylenol [Acetaminophen] Other (See Comments)    Affects kidneys    Patient Measurements: Height: 5\' 5"  (165.1 cm) IBW/kg (Calculated) : 61.5 Heparin Dosing Weight: 76 kg (using recent admission discharge TBW wt, <125% pt's IBW)  Vital Signs: Temp: 98.3 F (36.8 C) (07/19 0850) Temp Source: Oral (07/19 0850) BP: 165/89 mmHg (07/19 0735) Pulse Rate: 102 (07/19 0539)  Labs:  Recent Labs  10/05/14 2201 10/06/14 0223 10/06/14 0714  HGB 8.5*  --  7.6*  HCT 28.4*  --  24.7*  PLT 363  --  325  APTT 32  --   --   LABPROT 14.3  --   --   INR 1.09  --   --   CREATININE 1.18  --  1.05  TROPONINI 0.46* 0.24* 0.27*    CrCl cannot be calculated (Unknown ideal weight.).   Medical History: Past Medical History  Diagnosis Date  . Iron deficiency anemia   . Chronic combined systolic and diastolic CHF, NYHA class 2     a. 11/2013 Echo: EF 40-45%, basl-mid inflat AK, Gr 2 DD.  [name] Hyperlipidemia LDL goal <70   . Ischemic cardiomyopathy     a. 11/2013 Echo: EF 40-45%, basl-mid inflat AK, Gr 2 DD, mild AI, mod dil LA/RA, mod reduced RV fxn, PASP 85mmHg.  . Moderate to severe pulmonary hypertension 03/2010    a. PA peak pressure of 76 mmHg (per 2D Echo 03/2010)  . Polysubstance abuse     a. cocaine, THC  . AV malformation of gastrointestinal tract     a. w/ h/o GIB.  [name] Family history of early CAD   . Peripheral vascular disease   . Seizure disorder   . Hypertension   . CAD (coronary artery disease)     a. s/p 3-vessel CABG (12/2007) // 100% RCA stenosis with collaterals from left system. Severe bifurcation lesions of proxima CXA and OM. Moderate LAD disease - followed by Dr. Wyline Copas in [facility];  b. 11/2013 Myoview: Large fixed inf defect w/o ischemia, EF 35%.  . Chronic kidney  disease (CKD), stage III (moderate)     a. BL SCr 1.5-1.6  . Renal artery stenosis   . Cancer     "I have cancer; right now it's not known what kind" (09/17/2014)  . Myocardial infarction 08/2007; 12/2007  . Anginal pain   . COPD (chronic obstructive pulmonary disease)   . Pneumonia     "I've had pneumonia once" (09/17/2014)  . Obstructive sleep apnea     "never told me to wear mask" (09/17/2014)  . History of blood transfusion "I've had quite a few"    "they don't know where it's coming from" (09/17/2014)  . History of hiatal hernia   . Seizures     "I've had a few; don't know what kind they call it" (09/17/2014)  . DDD (degenerative disc disease), lumbar   . Arthritis     "ankles; left hip; wrists" (09/17/2014)  . Chronic lower back pain   . Depression   . Bipolar disorder   . Schizophrenia     Medications:  Scheduled:  . amLODipine  10 mg Oral Daily  . aspirin EC  81 mg Oral Daily  . atorvastatin  80 mg Oral q1800  . budesonide-formoterol  2 puff Inhalation BID  .  citalopram  20 mg Oral Daily  . cloNIDine  0.3 mg Oral BID  . divalproex  500 mg Oral BID  . ferrous sulfate  325 mg Oral BID WC  . hydrALAZINE  100 mg Oral TID  . isosorbide mononitrate  60 mg Oral Daily  . pantoprazole  40 mg Oral Daily  . potassium chloride SA  20 mEq Oral Daily  . sodium chloride  3 mL Intravenous Q12H  . torsemide  40 mg Oral BID    Assessment: 69 y.o. male admitted for further management of NSTEMI presented with CP. PMH of CAD status post CABG, chronic combined systolic and diastolic CHF last measured was 45-50%, CKD stage III w/ possible cirrhosis. Chest CT showed ascending aortic aneurysm.   Goal of Therapy:  Heparin level 0.3-0.5 units/ml (conservative goal due to low Hgb) Monitor platelets by anticoagulation protocol: Yes   Plan:  Start Heparin  - Bolus 2000u (conservative) - Infusion 900u/hr Monitor CBC, s/sx of bleeding Heparin level 6 hour, and daily levels  Heparin  initiated per rounds discussion with Dr. Tana Coast. Low Hbg noted, historically low in past admissions, pt has history of CKD and iron deficiency anemia. Will use conservative dosing.  Darl Pikes, PharmD Clinical Pharmacist- Resident Pager: [phone]  Darl Pikes 10/06/2014,10:10 AM

## 2014-10-06 NOTE — Progress Notes (Signed)
Subjective:  Snoring, nods his head to questions  Objective:  Vital Signs in the last 24 hours: Temp:  [98 F (36.7 C)-98.7 F (37.1 C)] 98 F (36.7 C) (07/19 1242) Pulse Rate:  [84-106] 84 (07/19 1242) Resp:  [14-29] 22 (07/19 1300) BP: (103-179)/(55-110) 106/61 mmHg (07/19 1300) SpO2:  [90 %-100 %] 90 % (07/19 1242) Weight:  [154 lb 3.2 oz (69.945 kg)] 154 lb 3.2 oz (69.945 kg) (07/19 1115)  Intake/Output from previous day:  Intake/Output Summary (Last 24 hours) at 10/06/14 1325 Last data filed at 10/06/14 1115  Gross per 24 hour  Intake  43.65 ml  Output    600 ml  Net -556.35 ml    Physical Exam: General appearance: no distress and sleeping Lungs: decreased breath sounds Heart: regular rate and rhythm   Rate: 78  Rhythm: normal sinus rhythm  Lab Results:  Recent Labs  10/05/14 2201 10/06/14 0714  WBC 5.8 4.0  HGB 8.5* 7.6*  PLT 363 325    Recent Labs  10/05/14 2201 10/06/14 0714  NA 138 137  K 4.4 4.0  CL 105 105  CO2 24 24  GLUCOSE 103* 102*  BUN 28* 22*  CREATININE 1.18 1.05    Recent Labs  10/06/14 0714 10/06/14 1151  TROPONINI 0.27* 0.34*    Recent Labs  10/05/14 2201  INR 1.09    Scheduled Meds: . amLODipine  10 mg Oral Daily  . aspirin EC  81 mg Oral Daily  . atorvastatin  80 mg Oral q1800  . budesonide-formoterol  2 puff Inhalation BID  . citalopram  20 mg Oral Daily  . cloNIDine  0.3 mg Oral BID  . divalproex  500 mg Oral BID  . ferrous sulfate  325 mg Oral BID WC  . hydrALAZINE  100 mg Oral TID  . isosorbide mononitrate  60 mg Oral Daily  . pantoprazole  40 mg Oral Daily  . potassium chloride SA  20 mEq Oral Daily  . sodium chloride  3 mL Intravenous Q12H  . torsemide  40 mg Oral BID   Continuous Infusions: . heparin 900 Units/hr (10/06/14 1149)  . nitroGLYCERIN 70 mcg/min (10/06/14 0541)   PRN Meds:.albuterol, morphine injection, ondansetron **OR** ondansetron (ZOFRAN) IV   Imaging: Imaging results have  been reviewed Dilated ascending AO- 4.6 cm, bilateral proximal iliac severe narrowing noted.   Cardiac Studies:  Assessment/Plan:  69 y.o. AA male with h/o recurrent cocaine abuse, GI bleed, diastolic CHF (EF 12-87% 8/67), CAD s/p CABG 2009, and stage 3 CKD, Myoview in Sept 2015 showed scar, no ischemia, and PAD ABI- R 0.42 and L 0.22. He has had multiple hospitalizations due to acute HF. Most recent discharge 09/18/14 with volume overload. He had also been admitted in June for the same. He presents now with abdominal pain, chest pain, and elevated Troponin. He is again cocaine positive.   Principal Problem:   Elevated troponin I level Active Problems:   CAD- CABG x3 '09. Myoview no ischemia 11/27/13   Cocaine abuse   Iron deficiency anemia   Chronic kidney disease (CKD), stage III (moderate)   Polysubstance abuse   Chronic diastolic heart failure   Abdominal pain, diffuse   Hypertension   Convulsions/seizures   Gastric AVM   History of GI bleed   ICM- EF 45-50% March 2016   PAT- 10bts,during Myoview 11/27/13   PLAN: Review of labs show elevated Troponin levels going back to April 2016, though this admission Troponin is higher  than it has been. Will discuss further work up with MD. May be best to just treat him medically with recalcitrant cocaine use. Avoid beta blocker. Currently on IV Heparin and NTG. No plans for cath today, will resume diet.   [employer] PA-C 10/06/2014, 1:25 PM [phone]

## 2014-10-06 NOTE — Progress Notes (Signed)
Responded to a "charge nurse" call from patient's room at approximately 20:30. Patient being verbally abusive and cursing at staff, demanding IV medications for pain "right now." Attempted to assist to de-escalate unsuccessfully. Will communicate to house administrative coordinator if necessary.  Primary RN addressing patient's ordered medications at this time.  Will continue to follow.

## 2014-10-06 NOTE — Progress Notes (Signed)
Patient arrived to floor complaining of 9/10 chest pain and nausea. Uncomfortable appearing. Titrated IV nitroglycerin to 55mcg/min and gave morphine 1mg  IV per orders. Will reassess at 15 minutes and continue to treat per orders/protocol.

## 2014-10-06 NOTE — ED Notes (Signed)
Report attempted 

## 2014-10-07 DIAGNOSIS — I10 Essential (primary) hypertension: Secondary | ICD-10-CM

## 2014-10-07 DIAGNOSIS — F1910 Other psychoactive substance abuse, uncomplicated: Secondary | ICD-10-CM

## 2014-10-07 LAB — GLUCOSE, CAPILLARY: Glucose-Capillary: 86 mg/dL (ref 65–99)

## 2014-10-07 LAB — CBC
HCT: 26.9 % — ABNORMAL LOW (ref 39.0–52.0)
HEMOGLOBIN: 8.3 g/dL — AB (ref 13.0–17.0)
MCH: 27.9 pg (ref 26.0–34.0)
MCHC: 30.9 g/dL (ref 30.0–36.0)
MCV: 90.3 fL (ref 78.0–100.0)
Platelets: 346 10*3/uL (ref 150–400)
RBC: 2.98 MIL/uL — ABNORMAL LOW (ref 4.22–5.81)
RDW: 18.6 % — ABNORMAL HIGH (ref 11.5–15.5)
WBC: 5.2 10*3/uL (ref 4.0–10.5)

## 2014-10-07 MED ORDER — ATORVASTATIN CALCIUM 80 MG PO TABS: 80.0000 mg | ORAL_TABLET | Freq: Every day | ORAL | Status: AC

## 2014-10-07 NOTE — Progress Notes (Signed)
Pt being discharged home. VSS. Pt agrees to all discharge instructions, and also will schedule follow up appointment. RN assisted pt to call for PCP.

## 2014-10-07 NOTE — Progress Notes (Signed)
After pt received daily medications, his BP trended down. RN called to room to obtain a manual blood pressure. 82/46, made MD aware. Md will address BP medications, will continue to monitor closely. Etta Quill, RN '

## 2014-10-07 NOTE — Discharge Summary (Addendum)
Physician Discharge Summary  Charles [name] MWN:[id number] DOB: 1945/09/22 DOA: 10/05/2014  PCP: No PCP Per Patient  Admit date: 10/05/2014 Discharge date: 10/07/2014  Time spent: 35 minutes  Recommendations for Outpatient Follow-up:  1. Need to counsel about compliance. 2. PCP needs to continue to counsel staining from cocaine 3. Follow-up with cardiology on August 4 at 11 AM to evaluate volume status.  Discharge Diagnoses:  Principal Problem:   Elevated troponin I level Active Problems:   Iron deficiency anemia   Hypertension   CAD- CABG x3 '09. Myoview no ischemia 11/27/13   Chronic kidney disease (CKD), stage III (moderate)   Polysubstance abuse   Peripheral vascular disease   Convulsions/seizures   Gastric AVM   History of GI bleed   ICM- EF 45-50% March 2016   PAT- 10bts,during Myoview 11/27/13   Cocaine abuse   Chronic diastolic heart failure   Abdominal pain, diffuse   Abnormal finding on EKG   Discharge Condition: stable  Diet recommendation: low sodium  Filed Weights   10/06/14 1115 10/07/14 0400  Weight: 69.945 kg (154 lb 3.2 oz) 71.759 kg (158 lb 3.2 oz)    History of present illness:  69 y.o. male with history of CAD status post CABG, chronic combined systolic and diastolic CHF last measured was 45-50%, chronic kidney disease, possible cirrhosis, ongoing cocaine abuse presents to the ER because of chest pain. Patient's chest pain started last evening around 4 PM. Pain is mostly on the left anterior chest wall nonradiating pressure-like. Patient in addition also has been having some abdominal discomfort. CT angiogram of the chest and abdomen shows ascending aortic aneurysm  Hospital Course:  Elevated troponins: Cardiology was consulted which recommended starting patient on IV nitroglycerin and aspirin and statins. Beta blockers were not started secondary to cocaine. He was not started due to history of gastric AVMs and significant anemia. Cardiology  recommended no further workup, as they're not convinced he is willing to stop the cocaine use and mainly due to his poor compliance. They recommended to continue medical therapy.  Cocaine abuse: Counseling.  Uncontrolled hypertension: He was started on IV nitroglycerin and his home medications were resumed his blood pressures follow closely and it trended down nicely. We'll need to follow-up with his primary care doctor as an outpatient.  Chronic combined systolic and diastolic heart failure: Appears to be compensated.  Chronic normocytic anemia: Hemoglobin remained stable follow-upis an outpatient.  Abdominal pain: His CT scan of the abdomen was unremarkable. Follow-up with his PCP as an outpatient. His hemoglobin remained stable.  Chronic kidney disease stage III: Remain at baseline.     Procedures:  CXR  Consultations:  cardiology  Discharge Exam: Filed Vitals:   10/07/14 1010  BP: 82/54  Pulse:   Temp:   Resp:     General: A&O x3 Cardiovascular: RRR Respiratory: good air movement CTA B/L  Discharge Instructions   Discharge Instructions    Diet - low sodium heart healthy    Complete by:  As directed      Increase activity slowly    Complete by:  As directed           Current Discharge Medication List    START taking these medications   Details  atorvastatin (LIPITOR) 80 MG tablet Take 1 tablet (80 mg total) by mouth daily at 6 PM. Qty: 30 tablet, Refills: 0      CONTINUE these medications which have NOT CHANGED   Details  albuterol (PROVENTIL HFA;VENTOLIN HFA)  108 (90 BASE) MCG/ACT inhaler Inhale 2 puffs into the lungs every 6 (six) hours as needed for wheezing or shortness of breath. Qty: 1 Inhaler, Refills: 2    amLODipine (NORVASC) 10 MG tablet Take 1 tablet (10 mg total) by mouth daily. Qty: 30 tablet, Refills: 0    aspirin EC 81 MG tablet Take 1 tablet (81 mg total) by mouth daily.    budesonide-formoterol (SYMBICORT) 80-4.5 MCG/ACT  inhaler Inhale 2 puffs into the lungs 2 (two) times daily. Qty: 1 Inhaler, Refills: 12    citalopram (CELEXA) 20 MG tablet Take 20 mg by mouth daily.    cloNIDine (CATAPRES) 0.3 MG tablet Take 1 tablet (0.3 mg total) by mouth 2 (two) times daily. Qty: 60 tablet, Refills: 3    diclofenac sodium (VOLTAREN) 1 % GEL Apply 4 g topically 4 (four) times daily. Qty: 100 g, Refills: 0    divalproex (DEPAKOTE ER) 500 MG 24 hr tablet Take 500 mg by mouth 2 (two) times daily.    ferrous sulfate 325 (65 FE) MG tablet Take 325 mg by mouth 2 (two) times daily with a meal.     hydrALAZINE (APRESOLINE) 100 MG tablet Take 100 mg by mouth 3 (three) times daily.    HYDROcodone-acetaminophen (NORCO/VICODIN) 5-325 MG per tablet Take 2 tablets by mouth every 6 (six) hours as needed for moderate pain. Qty: 30 tablet, Refills: 0    isosorbide mononitrate (IMDUR) 30 MG 24 hr tablet Take 2 tablets (60 mg total) by mouth daily. Qty: 30 tablet, Refills: 0    nitroGLYCERIN (NITROSTAT) 0.4 MG SL tablet Place 1 tablet (0.4 mg total) under the tongue every 5 (five) minutes as needed for chest pain (upto max 3 doses at one time.). Qty: 30 tablet, Refills: 0    pantoprazole (PROTONIX) 40 MG tablet Take 40 mg by mouth daily.     potassium chloride SA (KLOR-CON M20) 20 MEQ tablet Take 20 mEq by mouth daily.    torsemide (DEMADEX) 20 MG tablet Take 2 tablets (40 mg total) by mouth 2 (two) times daily. Qty: 120 tablet, Refills: 0       Allergies  Allergen Reactions  . Motrin [Ibuprofen] Other (See Comments)    Affects kidneys  . Tylenol [Acetaminophen] Other (See Comments)    Affects kidneys   Follow-up Information    Follow up with Glori Bickers, MD.   Specialty:  Cardiology   Why:  office will contact you   Contact information:   [address] [place] [address] [phone]        The results of significant diagnostics from this hospitalization (including imaging,  microbiology, ancillary and laboratory) are listed below for reference.    Significant Diagnostic Studies: Dg Chest 2 View  09/15/2014   CLINICAL DATA:  69 year old male with a history of congestive heart failure. Four day history of lower extremity swelling.  EXAM: CHEST - 2 VIEW  COMPARISON:  09/13/2014, 08/29/2014, 08/27/2014  FINDINGS: Cardiomediastinal silhouette unchanged, with persisting cardiomegaly.  Atherosclerosis.  Surgical changes of median sternotomy and coronary artery bypass grafting.  Slight improvement in aeration from the comparison study, with improved lung volumes and decreasing prominence of interlobular septal thickening and interstitial opacities.  No evidence of pleural effusion.  No pneumothorax.  No displaced fracture.  Unremarkable appearance of the upper abdomen.  IMPRESSION: Improving aeration with mild persisting pulmonary vascular congestion.  No pleural effusions or confluent airspace disease.  Cardiomegaly.  Signed,  Dulcy Fanny. Earleen Newport, DO  [facility]  [facility]   Electronically Signed   By: Corrie Mckusick D.O.   On: 09/15/2014 15:23   Ct Angio Chest Pe W/cm &/or Wo Cm  10/06/2014   CLINICAL DATA:  Acute onset of upper back pain and epigastric abdominal pain. Assess for pulmonary embolus. Initial encounter.  EXAM: CT ANGIOGRAPHY CHEST  CT ABDOMEN AND PELVIS WITH CONTRAST  TECHNIQUE: Multidetector CT imaging of the chest was performed using the standard protocol during bolus administration of intravenous contrast. Multiplanar CT image reconstructions and MIPs were obtained to evaluate the vascular anatomy. Multidetector CT imaging of the abdomen and pelvis was performed using the standard protocol during bolus administration of intravenous contrast.  CONTRAST:  174mL OMNIPAQUE IOHEXOL 350 MG/ML SOLN  COMPARISON:  Chest radiograph performed 10/05/2014, CTA of the chest performed 04/26/2014, and CT of the abdomen and pelvis from  07/22/2014  FINDINGS: CTA CHEST FINDINGS  There is no evidence of pulmonary embolus.  Mild bilateral interstitial prominence may reflect mild interstitial lung disease or possibly minimal interstitial edema. There is no evidence of pleural effusion or pneumothorax. No masses are identified; no abnormal focal contrast enhancement is seen.  There is dilatation of the ascending thoracic aorta to 4.6 cm in maximal AP dimension. Diffuse coronary artery calcifications are seen. Cardiomegaly is noted. Visualized mediastinal nodes remain normal in size. The patient is status post median sternotomy. No pericardial effusion is identified. The great vessels are grossly unremarkable in appearance. No axillary lymphadenopathy is seen. The visualized portions of the thyroid gland are unremarkable in appearance.  No acute osseous abnormalities are seen.  CT ABDOMEN and PELVIS FINDINGS  The liver and spleen are unremarkable in appearance. The gallbladder is within normal limits. The pancreas and adrenal glands are unremarkable.  A 1.3 cm cyst is noted at the interpole region of the left kidney. Nonspecific perinephric stranding is noted bilaterally. Calcifications are seen in both renal hila, vascular in nature. There is no evidence of hydronephrosis. No renal or ureteral stones are seen. No perinephric stranding is appreciated.  No free fluid is identified. The small bowel is unremarkable in appearance. The stomach is within normal limits. No acute vascular abnormalities are seen.  Diffuse calcification is noted along the abdominal aorta and its branches, with likely severe luminal narrowing noted at the proximal common iliac arteries bilaterally.  The appendix is normal in caliber and contains air, without evidence of appendicitis. The colon is unremarkable in appearance.  The bladder is mildly distended and grossly unremarkable. The prostate remains normal in size. No inguinal lymphadenopathy is seen.  No acute osseous  abnormalities are identified. Vacuum phenomenon is noted along the lower lumbar spine.  Review of the MIP images confirms the above findings.  IMPRESSION: 1. No evidence of pulmonary embolus. 2. Mild bilateral interstitial prominence may reflect mild interstitial lung disease or possibly minimal interstitial edema. 3. 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 Recommend semi-annual imaging followup by CTA or MRA and referral to cardiothoracic surgery if not already obtained. This recommendation follows 2010 ACCF/AHA/AATS/ACR/ASA/SCA/SCAI/SIR/STS/SVM Guidelines for the Diagnosis and Management of Patients With Thoracic Aortic Disease. Circulation. 2010; 121: P102-H852 4. Small left renal cyst noted. 5. Diffuse calcification along the abdominal aorta and its branches, with likely severe luminal narrowing at the proximal common iliac arteries bilaterally.   Electronically Signed   By: Garald Balding M.D.   On: 10/06/2014 02:55   Ct Abdomen Pelvis W Contrast  10/06/2014   CLINICAL DATA:  Acute onset of upper back pain and epigastric abdominal pain. Assess for pulmonary embolus. Initial encounter.  EXAM: CT ANGIOGRAPHY CHEST  CT ABDOMEN AND PELVIS WITH CONTRAST  TECHNIQUE: Multidetector CT imaging of the chest was performed using the standard protocol during bolus administration of intravenous contrast. Multiplanar CT image reconstructions and MIPs were obtained to evaluate the vascular anatomy. Multidetector CT imaging of the abdomen and pelvis was performed using the standard protocol during bolus administration of intravenous contrast.  CONTRAST:  12mL OMNIPAQUE IOHEXOL 350 MG/ML SOLN  COMPARISON:  Chest radiograph performed 10/05/2014, CTA of the chest performed 04/26/2014, and CT of the abdomen and pelvis from 07/22/2014  FINDINGS: CTA CHEST FINDINGS  There is no evidence of pulmonary embolus.  Mild bilateral interstitial prominence may reflect mild interstitial lung disease or  possibly minimal interstitial edema. There is no evidence of pleural effusion or pneumothorax. No masses are identified; no abnormal focal contrast enhancement is seen.  There is dilatation of the ascending thoracic aorta to 4.6 cm in maximal AP dimension. Diffuse coronary artery calcifications are seen. Cardiomegaly is noted. Visualized mediastinal nodes remain normal in size. The patient is status post median sternotomy. No pericardial effusion is identified. The great vessels are grossly unremarkable in appearance. No axillary lymphadenopathy is seen. The visualized portions of the thyroid gland are unremarkable in appearance.  No acute osseous abnormalities are seen.  CT ABDOMEN and PELVIS FINDINGS  The liver and spleen are unremarkable in appearance. The gallbladder is within normal limits. The pancreas and adrenal glands are unremarkable.  A 1.3 cm cyst is noted at the interpole region of the left kidney. Nonspecific perinephric stranding is noted bilaterally. Calcifications are seen in both renal hila, vascular in nature. There is no evidence of hydronephrosis. No renal or ureteral stones are seen. No perinephric stranding is appreciated.  No free fluid is identified. The small bowel is unremarkable in appearance. The stomach is within normal limits. No acute vascular abnormalities are seen.  Diffuse calcification is noted along the abdominal aorta and its branches, with likely severe luminal narrowing noted at the proximal common iliac arteries bilaterally.  The appendix is normal in caliber and contains air, without evidence of appendicitis. The colon is unremarkable in appearance.  The bladder is mildly distended and grossly unremarkable. The prostate remains normal in size. No inguinal lymphadenopathy is seen.  No acute osseous abnormalities are identified. Vacuum phenomenon is noted along the lower lumbar spine.  Review of the MIP images confirms the above findings.  IMPRESSION: 1. No evidence of  pulmonary embolus. 2. Mild bilateral interstitial prominence may reflect mild interstitial lung disease or possibly minimal interstitial edema. 3. 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 Recommend semi-annual imaging followup by CTA or MRA and referral to cardiothoracic surgery if not already obtained. This recommendation follows 2010 ACCF/AHA/AATS/ACR/ASA/SCA/SCAI/SIR/STS/SVM Guidelines for the Diagnosis and Management of Patients With Thoracic Aortic Disease. Circulation. 2010; 121: V371-G626 4. Small left renal cyst noted. 5. Diffuse calcification along the abdominal aorta and its branches, with likely severe luminal narrowing at the proximal common iliac arteries bilaterally.   Electronically Signed   By: Garald Balding M.D.   On: 10/06/2014 02:55   US Abdomen Limited  09/16/2014   CLINICAL DATA:  Abdominal pain.  History of cirrhosis.  EXAM: LIMITED ABDOMEN ULTRASOUND FOR ASCITES  TECHNIQUE: Limited ultrasound survey for ascites was performed in all four abdominal quadrants.  COMPARISON:  CT  07/22/2014.  Ultrasound 08/01/2014.  FINDINGS: No significant ascites identified on sonographic survey of 4 abdominal quadrants. Echogenic liver noted.  IMPRESSION: No evidence of recurrent ascites.   Electronically Signed   By: Richardean Sale M.D.   On: 09/16/2014 20:33   Dg Chest Port 1 View  10/05/2014   CLINICAL DATA:  69 year old male with chest pain  EXAM: PORTABLE CHEST - 1 VIEW  COMPARISON:  Radiograph dated 10/04/2014  FINDINGS: There is stable cardiomegaly with prominence of the central vasculature. An area of increased opacity at the left lower lung field may be related to an enlarged cardiac silhouette. Underlying pneumonia is not excluded. Clinical correlation is recommended. No significant pleural effusion or pneumothorax. Median sternotomy wires.  IMPRESSION: Stable cardiomegaly with congestive changes.  Left lower lung field opacity may be related to atelectasis  or pneumonia. Clinical correlation is recommended.   Electronically Signed   By: Anner Crete M.D.   On: 10/05/2014 22:51   Dg Abd Portable 1v  09/15/2014   CLINICAL DATA:  Abdominal pain and distention.  Constipation.  EXAM: PORTABLE ABDOMEN - 1 VIEW  COMPARISON:  07/03/2014 and CT scan abdomen dated 07/22/2014  FINDINGS: Chronic cardiomegaly. Bowel gas pattern is normal. Vascular calcifications throughout the abdomen including the renal arteries. Slight lumbar scoliosis. No excessive stool in the colon.  IMPRESSION: Benign appearing abdomen.   Electronically Signed   By: Lorriane Shire M.D.   On: 09/15/2014 20:23    Microbiology: Recent Results (from the past 240 hour(s))  MRSA PCR Screening     Status: Abnormal   Collection Time: 10/06/14  5:33 AM  Result Value Ref Range Status   MRSA by PCR POSITIVE (A) NEGATIVE Final    Comment:        The GeneXpert MRSA Assay (FDA approved for NASAL specimens only), is one component of a comprehensive MRSA colonization surveillance program. It is not intended to diagnose MRSA infection nor to guide or monitor treatment for MRSA infections. RESULT CALLED TO, READ BACK BY AND VERIFIED WITH: THOMPSON,V RN @ [phone] 10/06/14 LEONARD,A      Labs: Basic Metabolic Panel:  Recent Labs Lab 10/05/14 2201 10/06/14 0714  NA 138 137  K 4.4 4.0  CL 105 105  CO2 24 24  GLUCOSE 103* 102*  BUN 28* 22*  CREATININE 1.18 1.05  CALCIUM 8.8* 8.4*   Liver Function Tests:  Recent Labs Lab 10/05/14 2201 10/06/14 0714  AST 64* 40  ALT 21 17  ALKPHOS 74 56  BILITOT 0.8 0.8  PROT 7.4 6.5  ALBUMIN 3.3* 2.9*    Recent Labs Lab 10/06/14 0223 10/06/14 0714  LIPASE 26 27   No results for input(s): AMMONIA in the last 168 hours. CBC:  Recent Labs Lab 10/05/14 2201 10/06/14 0714 10/07/14 0251  WBC 5.8 4.0 5.2  NEUTROABS 4.4 2.8  --   HGB 8.5* 7.6* 8.3*  HCT 28.4* 24.7* 26.9*  MCV 90.7 90.5 90.3  PLT 363 325 346   Cardiac  Enzymes:  Recent Labs Lab 10/05/14 2201 10/06/14 0223 10/06/14 0714 10/06/14 1151  TROPONINI 0.46* 0.24* 0.27* 0.34*   BNP: BNP (last 3 results)  Recent Labs  09/14/14 1212 09/15/14 1430 10/05/14 2201  BNP 3617.4* 3756.9* 748.5*    ProBNP (last 3 results)  Recent Labs  01/01/14 1107 02/07/14 0123 03/01/14 2250  PROBNP 3225.0* 2648.0* 7421.0*    CBG:  Recent Labs Lab 10/06/14 0743 10/06/14 2340 10/07/14 0644  GLUCAP 97 100* 86  Signed:  Charlynne Cousins  [facility] 10/07/2014, 10:29 AM

## 2014-10-09 ENCOUNTER — Encounter: Payer: Self-pay | Admitting: Vascular Surgery

## 2014-10-14 ENCOUNTER — Encounter (HOSPITAL_COMMUNITY): Payer: Medicare Other

## 2014-10-14 ENCOUNTER — Encounter: Payer: Medicare Other | Admitting: Vascular Surgery

## 2014-10-15 ENCOUNTER — Emergency Department (HOSPITAL_COMMUNITY): Payer: Medicare Other

## 2014-10-15 ENCOUNTER — Inpatient Hospital Stay (HOSPITAL_COMMUNITY)
Admission: EM | Admit: 2014-10-15 | Discharge: 2014-10-18 | DRG: 302 | Disposition: A | Discharge status: Home / Self Care | Payer: Medicare Other | Attending: Internal Medicine | Admitting: Internal Medicine

## 2014-10-15 ENCOUNTER — Encounter (HOSPITAL_COMMUNITY): Payer: Self-pay | Admitting: Vascular Surgery

## 2014-10-15 DIAGNOSIS — R52 Pain, unspecified: Secondary | ICD-10-CM

## 2014-10-15 DIAGNOSIS — I2510 Atherosclerotic heart disease of native coronary artery without angina pectoris: Secondary | ICD-10-CM | POA: Diagnosis present

## 2014-10-15 DIAGNOSIS — I10 Essential (primary) hypertension: Secondary | ICD-10-CM | POA: Diagnosis present

## 2014-10-15 DIAGNOSIS — K922 Gastrointestinal hemorrhage, unspecified: Secondary | ICD-10-CM | POA: Insufficient documentation

## 2014-10-15 DIAGNOSIS — F319 Bipolar disorder, unspecified: Secondary | ICD-10-CM | POA: Diagnosis present

## 2014-10-15 DIAGNOSIS — M79605 Pain in left leg: Secondary | ICD-10-CM | POA: Diagnosis present

## 2014-10-15 DIAGNOSIS — G4733 Obstructive sleep apnea (adult) (pediatric): Secondary | ICD-10-CM | POA: Diagnosis present

## 2014-10-15 DIAGNOSIS — Z7982 Long term (current) use of aspirin: Secondary | ICD-10-CM

## 2014-10-15 DIAGNOSIS — Q2733 Arteriovenous malformation of digestive system vessel: Secondary | ICD-10-CM | POA: Diagnosis not present

## 2014-10-15 DIAGNOSIS — Z9114 Patient's other noncompliance with medication regimen: Secondary | ICD-10-CM | POA: Diagnosis present

## 2014-10-15 DIAGNOSIS — I252 Old myocardial infarction: Secondary | ICD-10-CM

## 2014-10-15 DIAGNOSIS — K2970 Gastritis, unspecified, without bleeding: Secondary | ICD-10-CM | POA: Diagnosis present

## 2014-10-15 DIAGNOSIS — Z87891 Personal history of nicotine dependence: Secondary | ICD-10-CM | POA: Diagnosis not present

## 2014-10-15 DIAGNOSIS — N183 Chronic kidney disease, stage 3 unspecified: Secondary | ICD-10-CM | POA: Diagnosis present

## 2014-10-15 DIAGNOSIS — Z7951 Long term (current) use of inhaled steroids: Secondary | ICD-10-CM | POA: Diagnosis not present

## 2014-10-15 DIAGNOSIS — I5032 Chronic diastolic (congestive) heart failure: Secondary | ICD-10-CM | POA: Diagnosis not present

## 2014-10-15 DIAGNOSIS — Z951 Presence of aortocoronary bypass graft: Secondary | ICD-10-CM

## 2014-10-15 DIAGNOSIS — Z8249 Family history of ischemic heart disease and other diseases of the circulatory system: Secondary | ICD-10-CM | POA: Diagnosis not present

## 2014-10-15 DIAGNOSIS — I5043 Acute on chronic combined systolic (congestive) and diastolic (congestive) heart failure: Secondary | ICD-10-CM | POA: Diagnosis present

## 2014-10-15 DIAGNOSIS — R079 Chest pain, unspecified: Secondary | ICD-10-CM | POA: Diagnosis present

## 2014-10-15 DIAGNOSIS — Z7289 Other problems related to lifestyle: Secondary | ICD-10-CM | POA: Diagnosis not present

## 2014-10-15 DIAGNOSIS — D62 Acute posthemorrhagic anemia: Secondary | ICD-10-CM | POA: Diagnosis present

## 2014-10-15 DIAGNOSIS — E785 Hyperlipidemia, unspecified: Secondary | ICD-10-CM | POA: Diagnosis present

## 2014-10-15 DIAGNOSIS — Z79899 Other long term (current) drug therapy: Secondary | ICD-10-CM

## 2014-10-15 DIAGNOSIS — R195 Other fecal abnormalities: Secondary | ICD-10-CM | POA: Diagnosis not present

## 2014-10-15 DIAGNOSIS — I739 Peripheral vascular disease, unspecified: Secondary | ICD-10-CM | POA: Diagnosis present

## 2014-10-15 DIAGNOSIS — I25119 Atherosclerotic heart disease of native coronary artery with unspecified angina pectoris: Secondary | ICD-10-CM | POA: Diagnosis present

## 2014-10-15 DIAGNOSIS — I129 Hypertensive chronic kidney disease with stage 1 through stage 4 chronic kidney disease, or unspecified chronic kidney disease: Secondary | ICD-10-CM | POA: Diagnosis present

## 2014-10-15 DIAGNOSIS — F1410 Cocaine abuse, uncomplicated: Secondary | ICD-10-CM | POA: Diagnosis present

## 2014-10-15 DIAGNOSIS — G8929 Other chronic pain: Secondary | ICD-10-CM | POA: Diagnosis present

## 2014-10-15 DIAGNOSIS — Z886 Allergy status to analgesic agent status: Secondary | ICD-10-CM | POA: Diagnosis not present

## 2014-10-15 DIAGNOSIS — Z9119 Patient's noncompliance with other medical treatment and regimen: Secondary | ICD-10-CM | POA: Diagnosis present

## 2014-10-15 DIAGNOSIS — D509 Iron deficiency anemia, unspecified: Secondary | ICD-10-CM | POA: Diagnosis present

## 2014-10-15 DIAGNOSIS — K31811 Angiodysplasia of stomach and duodenum with bleeding: Secondary | ICD-10-CM | POA: Diagnosis not present

## 2014-10-15 DIAGNOSIS — N179 Acute kidney failure, unspecified: Secondary | ICD-10-CM | POA: Diagnosis present

## 2014-10-15 DIAGNOSIS — G40909 Epilepsy, unspecified, not intractable, without status epilepticus: Secondary | ICD-10-CM | POA: Diagnosis present

## 2014-10-15 DIAGNOSIS — J449 Chronic obstructive pulmonary disease, unspecified: Secondary | ICD-10-CM | POA: Diagnosis present

## 2014-10-15 LAB — BASIC METABOLIC PANEL
ANION GAP: 8 (ref 5–15)
BUN: 16 mg/dL (ref 6–20)
CO2: 23 mmol/L (ref 22–32)
CREATININE: 1.06 mg/dL (ref 0.61–1.24)
Calcium: 8.7 mg/dL — ABNORMAL LOW (ref 8.9–10.3)
Chloride: 106 mmol/L (ref 101–111)
GFR calc non Af Amer: >60 mL/min (ref >60)
GLUCOSE: 80 mg/dL (ref 65–99)
Potassium: 4.3 mmol/L (ref 3.5–5.1)
Sodium: 137 mmol/L (ref 135–145)

## 2014-10-15 LAB — CBC
HCT: 22.2 % — ABNORMAL LOW (ref 39.0–52.0)
Hemoglobin: 6.8 g/dL — CL (ref 13.0–17.0)
MCH: 26.5 pg (ref 26.0–34.0)
MCHC: 30.6 g/dL (ref 30.0–36.0)
MCV: 86.4 fL (ref 78.0–100.0)
PLATELETS: 326 10*3/uL (ref 150–400)
RBC: 2.57 MIL/uL — ABNORMAL LOW (ref 4.22–5.81)
RDW: 16.9 % — ABNORMAL HIGH (ref 11.5–15.5)
WBC: 5.1 10*3/uL (ref 4.0–10.5)

## 2014-10-15 LAB — TROPONIN I: TROPONIN I: 0.07 ng/mL — AB (ref <0.031)

## 2014-10-15 LAB — RAPID URINE DRUG SCREEN, HOSP PERFORMED
AMPHETAMINES: NOT DETECTED
BARBITURATES: NOT DETECTED
Benzodiazepines: NOT DETECTED
Cocaine: NOT DETECTED
OPIATES: NOT DETECTED
TETRAHYDROCANNABINOL: NOT DETECTED

## 2014-10-15 LAB — I-STAT CG4 LACTIC ACID, ED: Lactic Acid, Venous: 1.9 mmol/L (ref 0.5–2.0)

## 2014-10-15 LAB — BRAIN NATRIURETIC PEPTIDE: B NATRIURETIC PEPTIDE 5: 2506 pg/mL — AB (ref 0.0–100.0)

## 2014-10-15 LAB — I-STAT TROPONIN, ED: Troponin i, poc: 0.06 ng/mL (ref 0.00–0.08)

## 2014-10-15 LAB — CK: CK TOTAL: 50 U/L (ref 49–397)

## 2014-10-15 LAB — PREPARE RBC (CROSSMATCH)

## 2014-10-15 LAB — POC OCCULT BLOOD, ED: FECAL OCCULT BLD: POSITIVE — AB

## 2014-10-15 MED ORDER — LORAZEPAM 1 MG PO TABS
1.0000 mg | ORAL_TABLET | Freq: Once | ORAL | Status: AC
  Administered 2014-10-15: 1 mg via ORAL
  Filled 2014-10-15: qty 1

## 2014-10-15 MED ORDER — FENTANYL CITRATE (PF) 100 MCG/2ML IJ SOLN
50.0000 ug | Freq: Once | INTRAMUSCULAR | Status: AC
  Administered 2014-10-15: 50 ug via INTRAVENOUS
  Filled 2014-10-15: qty 2

## 2014-10-15 MED ORDER — ASPIRIN 81 MG PO CHEW
324.0000 mg | CHEWABLE_TABLET | Freq: Once | ORAL | Status: AC
  Administered 2014-10-15: 324 mg via ORAL
  Filled 2014-10-15: qty 4

## 2014-10-15 MED ORDER — ONDANSETRON HCL 4 MG/2ML IJ SOLN
4.0000 mg | Freq: Four times a day (QID) | INTRAMUSCULAR | Status: DC | PRN
  Administered 2014-10-16: 4 mg via INTRAVENOUS

## 2014-10-15 MED ORDER — POTASSIUM CHLORIDE CRYS ER 20 MEQ PO TBCR
20.0000 meq | EXTENDED_RELEASE_TABLET | Freq: Every day | ORAL | Status: DC
  Administered 2014-10-16 – 2014-10-18 (×3): 20 meq via ORAL
  Filled 2014-10-15 (×4): qty 1

## 2014-10-15 MED ORDER — FERROUS SULFATE 325 (65 FE) MG PO TABS
325.0000 mg | ORAL_TABLET | Freq: Two times a day (BID) | ORAL | Status: DC
  Administered 2014-10-16 – 2014-10-18 (×4): 325 mg via ORAL
  Filled 2014-10-15 (×8): qty 1

## 2014-10-15 MED ORDER — PANTOPRAZOLE SODIUM 40 MG IV SOLR: 40.0000 mg | Freq: Two times a day (BID) | INTRAVENOUS | Status: DC

## 2014-10-15 MED ORDER — PANTOPRAZOLE SODIUM 40 MG IV SOLR
80.0000 mg | Freq: Once | INTRAVENOUS | Status: AC
  Administered 2014-10-15: 80 mg via INTRAVENOUS
  Filled 2014-10-15: qty 80

## 2014-10-15 MED ORDER — CLONIDINE HCL 0.3 MG PO TABS
0.3000 mg | ORAL_TABLET | Freq: Two times a day (BID) | ORAL | Status: DC
  Administered 2014-10-15 – 2014-10-18 (×6): 0.3 mg via ORAL
  Filled 2014-10-15 (×7): qty 1

## 2014-10-15 MED ORDER — HYDRALAZINE HCL 50 MG PO TABS
100.0000 mg | ORAL_TABLET | Freq: Three times a day (TID) | ORAL | Status: DC
  Administered 2014-10-15 – 2014-10-18 (×7): 100 mg via ORAL
  Filled 2014-10-15 (×10): qty 2

## 2014-10-15 MED ORDER — SODIUM CHLORIDE 0.9 % IJ SOLN
3.0000 mL | Freq: Two times a day (BID) | INTRAMUSCULAR | Status: DC
  Administered 2014-10-15 – 2014-10-17 (×5): 3 mL via INTRAVENOUS

## 2014-10-15 MED ORDER — ISOSORBIDE MONONITRATE ER 60 MG PO TB24
60.0000 mg | ORAL_TABLET | Freq: Every day | ORAL | Status: DC
  Filled 2014-10-15: qty 1

## 2014-10-15 MED ORDER — DIVALPROEX SODIUM ER 500 MG PO TB24
500.0000 mg | ORAL_TABLET | Freq: Two times a day (BID) | ORAL | Status: DC
  Administered 2014-10-15 – 2014-10-18 (×6): 500 mg via ORAL
  Filled 2014-10-15 (×7): qty 1

## 2014-10-15 MED ORDER — ALBUTEROL SULFATE (2.5 MG/3ML) 0.083% IN NEBU: 2.5000 mg | INHALATION_SOLUTION | Freq: Four times a day (QID) | RESPIRATORY_TRACT | Status: DC | PRN

## 2014-10-15 MED ORDER — CITALOPRAM HYDROBROMIDE 20 MG PO TABS
20.0000 mg | ORAL_TABLET | Freq: Every day | ORAL | Status: DC
  Administered 2014-10-16 – 2014-10-18 (×3): 20 mg via ORAL
  Filled 2014-10-15 (×3): qty 1

## 2014-10-15 MED ORDER — PHENYTOIN SODIUM EXTENDED 100 MG PO CAPS
200.0000 mg | ORAL_CAPSULE | Freq: Every day | ORAL | Status: DC
  Filled 2014-10-15: qty 2

## 2014-10-15 MED ORDER — BUDESONIDE-FORMOTEROL FUMARATE 80-4.5 MCG/ACT IN AERO
2.0000 | INHALATION_SPRAY | Freq: Two times a day (BID) | RESPIRATORY_TRACT | Status: DC
  Administered 2014-10-16 – 2014-10-18 (×3): 2 via RESPIRATORY_TRACT
  Filled 2014-10-15 (×3): qty 6.9

## 2014-10-15 MED ORDER — PHENYTOIN SODIUM EXTENDED 100 MG PO CAPS
200.0000 mg | ORAL_CAPSULE | Freq: Two times a day (BID) | ORAL | Status: DC
  Administered 2014-10-15 – 2014-10-18 (×6): 200 mg via ORAL
  Filled 2014-10-15 (×7): qty 2

## 2014-10-15 MED ORDER — AMLODIPINE BESYLATE 10 MG PO TABS
10.0000 mg | ORAL_TABLET | Freq: Every day | ORAL | Status: DC
  Administered 2014-10-16 – 2014-10-18 (×3): 10 mg via ORAL
  Filled 2014-10-15 (×3): qty 1

## 2014-10-15 MED ORDER — IOHEXOL 350 MG/ML SOLN
100.0000 mL | Freq: Once | INTRAVENOUS | Status: AC | PRN
  Administered 2014-10-15: 100 mL via INTRAVENOUS

## 2014-10-15 MED ORDER — MORPHINE SULFATE 2 MG/ML IJ SOLN
1.0000 mg | INTRAMUSCULAR | Status: DC | PRN
  Administered 2014-10-15 – 2014-10-17 (×9): 1 mg via INTRAVENOUS
  Filled 2014-10-15 (×11): qty 1

## 2014-10-15 MED ORDER — ONDANSETRON HCL 4 MG PO TABS: 4.0000 mg | ORAL_TABLET | Freq: Four times a day (QID) | ORAL | Status: DC | PRN

## 2014-10-15 MED ORDER — PANTOPRAZOLE SODIUM 40 MG PO TBEC
40.0000 mg | DELAYED_RELEASE_TABLET | Freq: Every day | ORAL | Status: DC
  Filled 2014-10-15: qty 1

## 2014-10-15 MED ORDER — FUROSEMIDE 10 MG/ML IJ SOLN
40.0000 mg | Freq: Two times a day (BID) | INTRAMUSCULAR | Status: DC
  Administered 2014-10-15 – 2014-10-17 (×4): 40 mg via INTRAVENOUS
  Filled 2014-10-15 (×5): qty 4

## 2014-10-15 MED ORDER — PHENYTOIN SODIUM EXTENDED 100 MG PO CAPS: 200.0000 mg | ORAL_CAPSULE | Freq: Every day | ORAL | Status: DC

## 2014-10-15 MED ORDER — METOLAZONE 5 MG PO TABS
5.0000 mg | ORAL_TABLET | Freq: Every day | ORAL | Status: DC
  Administered 2014-10-16 – 2014-10-17 (×2): 5 mg via ORAL
  Filled 2014-10-15 (×2): qty 1

## 2014-10-15 MED ORDER — ATORVASTATIN CALCIUM 80 MG PO TABS
80.0000 mg | ORAL_TABLET | Freq: Every day | ORAL | Status: DC
  Administered 2014-10-16 – 2014-10-17 (×2): 80 mg via ORAL
  Filled 2014-10-15 (×3): qty 1

## 2014-10-15 MED ORDER — FUROSEMIDE 10 MG/ML IJ SOLN
20.0000 mg | Freq: Once | INTRAMUSCULAR | Status: AC
  Administered 2014-10-15: 20 mg via INTRAVENOUS
  Filled 2014-10-15: qty 2

## 2014-10-15 MED ORDER — SODIUM CHLORIDE 0.9 % IV SOLN
8.0000 mg/h | INTRAVENOUS | Status: DC
  Administered 2014-10-15: 8 mg/h via INTRAVENOUS
  Filled 2014-10-15 (×4): qty 80

## 2014-10-15 MED ORDER — LISINOPRIL 20 MG PO TABS
20.0000 mg | ORAL_TABLET | Freq: Every day | ORAL | Status: DC
  Administered 2014-10-16 – 2014-10-18 (×3): 20 mg via ORAL
  Filled 2014-10-15 (×3): qty 1

## 2014-10-15 NOTE — ED Provider Notes (Signed)
CSN: [id number]     Arrival date & time 10/15/14  1419 History   First MD Initiated Contact with Patient 10/15/14 1606     Chief Complaint  Patient presents with  . Leg Pain  . Chest Pain     (Consider location/radiation/quality/duration/timing/severity/associated sxs/prior Treatment) HPI Comments: Patient with one day history of left-sided chest pain that is constant and woke him from sleep this morning. Pain radiates to the left arm. Denies any SOB, nausea, vomiting.  States his last cocaine use was 1 week ago.  Endorses bilateral leg pain that has been ongoing today as well.  Denies any falls.  No focal weakness, numbness, tingling.  Had episode of blood in the toilet bowel this morning.     Patient is a 69 y.o. male presenting with chest pain. The history is provided by the patient.  Chest Pain Associated symptoms: weakness   Associated symptoms: no abdominal pain, no dizziness, no fever, no headache, no nausea, no shortness of breath and not vomiting     Past Medical History  Diagnosis Date  . Iron deficiency anemia   . Chronic combined systolic and diastolic CHF, NYHA class 2     a. 11/2013 Echo: EF 40-45%, basl-mid inflat AK, Gr 2 DD.  [name] Hyperlipidemia LDL goal <70   . Ischemic cardiomyopathy     a. 11/2013 Echo: EF 40-45%, basl-mid inflat AK, Gr 2 DD, mild AI, mod dil LA/RA, mod reduced RV fxn, PASP 29mmHg.  . Moderate to severe pulmonary hypertension 03/2010    a. PA peak pressure of 76 mmHg (per 2D Echo 03/2010)  . Polysubstance abuse     a. cocaine, THC  . AV malformation of gastrointestinal tract     a. w/ h/o GIB.  [name] Family history of early CAD   . Peripheral vascular disease   . Seizure disorder   . Hypertension   . CAD (coronary artery disease)     a. s/p 3-vessel CABG (12/2007) // 100% RCA stenosis with collaterals from left system. Severe bifurcation lesions of proxima CXA and OM. Moderate LAD disease - followed by Dr. Wyline Copas in [facility];  b. 11/2013 Myoview:  Large fixed inf defect w/o ischemia, EF 35%.  . Chronic kidney disease (CKD), stage III (moderate)     a. BL SCr 1.5-1.6  . Renal artery stenosis   . Cancer     "I have cancer; right now it's not known what kind" (09/17/2014)  . Myocardial infarction 08/2007; 12/2007  . Anginal pain   . COPD (chronic obstructive pulmonary disease)   . Pneumonia     "I've had pneumonia once" (09/17/2014)  . Obstructive sleep apnea     "never told me to wear mask" (09/17/2014)  . History of blood transfusion "I've had quite a few"    "they don't know where it's coming from" (09/17/2014)  . History of hiatal hernia   . Seizures     "I've had a few; don't know what kind they call it" (09/17/2014)  . DDD (degenerative disc disease), lumbar   . Arthritis     "ankles; left hip; wrists" (09/17/2014)  . Chronic lower back pain   . Depression   . Bipolar disorder   . Schizophrenia    Past Surgical History  Procedure Laterality Date  . Apc  03/2010    To treat small bowel AVMs  . Esophagogastroduodenoscopy N/A 08/14/2012    Procedure: ESOPHAGOGASTRODUODENOSCOPY (EGD);  Surgeon: [name], MD;  Location: [place];  Service:  Endoscopy;  Laterality: N/A;  . Hot hemostasis N/A 08/14/2012    Procedure: HOT HEMOSTASIS (ARGON PLASMA COAGULATION/BICAP);  Surgeon: [name], MD;  Location: [facility] ENDOSCOPY;  Service: Endoscopy;  Laterality: N/A;  . Esophagogastroduodenoscopy (egd) with propofol N/A 08/04/2013    Procedure: ESOPHAGOGASTRODUODENOSCOPY (EGD) WITH PROPOFOL;  Surgeon: [name], MD;  Location: [facility] ENDOSCOPY;  Service: Endoscopy;  Laterality: N/A;  . Esophagogastroduodenoscopy (egd) with propofol N/A 06/08/2014    Procedure: ESOPHAGOGASTRODUODENOSCOPY (EGD) WITH PROPOFOL;  Surgeon: Milus Banister, MD;  Location: [place];  Service: Endoscopy;  Laterality: N/A;  . Peripheral vascular catheterization N/A 09/07/2014    Procedure: Abdominal Aortogram;  Surgeon: Angelia Mould, MD;  Location: [place] CV LAB;  Service: Cardiovascular;  Laterality: N/A;  . Coronary artery bypass graft  12/2007    "CABG X3"  . Coronary angioplasty with stent placement  08/2007   Family History  Problem Relation Age of Onset  . Heart disease Mother     unknown type  . Heart disease Father 43    died of MI at 34yo  . Heart disease Paternal Grandfather 58    died of MI  . Heart disease    . Heart disease Brother 30   History  Substance Use Topics  . Smoking status: Former Smoker -- 0.25 packs/day for 10 years    Types: Cigarettes  . Smokeless tobacco: Never Used     Comment: "quit smoking cigarettes in 2009; smoked off and on since I was 13"  . Alcohol Use: 0.0 oz/week    0 Standard drinks or equivalent per week     Comment: 09/17/2014 "maybe 4 beers/month, 12 oz cans"    Review of Systems  Constitutional: Negative for fever, activity change and appetite change.  HENT: Negative for congestion and rhinorrhea.   Eyes: Negative for visual disturbance.  Respiratory: Positive for chest tightness. Negative for shortness of breath.   Cardiovascular: Positive for chest pain and leg swelling.  Gastrointestinal: Negative for nausea, vomiting and abdominal pain.  Genitourinary: Negative for dysuria, hematuria and testicular pain.  Musculoskeletal: Positive for myalgias and arthralgias.  Neurological: Positive for weakness. Negative for dizziness and headaches.  Hematological: Negative for adenopathy.  Psychiatric/Behavioral: Negative for agitation.  A complete 10 system review of systems was obtained and all systems are negative except as noted in the HPI and PMH.      Allergies  Motrin and Tylenol  Home Medications   Prior to Admission medications   Medication Sig Start Date End Date Taking? Authorizing Provider  albuterol (PROVENTIL HFA;VENTOLIN HFA) 108 (90 BASE) MCG/ACT inhaler Inhale 2 puffs into the lungs every 6 (six) hours as needed for wheezing or shortness of breath. 04/30/14   Yes Thurnell Lose, MD  amLODipine (NORVASC) 10 MG tablet Take 1 tablet (10 mg total) by mouth daily. 09/18/14  Yes Shanker Kristeen Mans, MD  aspirin EC 81 MG tablet Take 1 tablet (81 mg total) by mouth daily. 09/18/14  Yes Shanker Kristeen Mans, MD  budesonide-formoterol (SYMBICORT) 80-4.5 MCG/ACT inhaler Inhale 2 puffs into the lungs 2 (two) times daily. 09/08/14  Yes Ripudeep Krystal Eaton, MD  citalopram (CELEXA) 20 MG tablet Take 20 mg by mouth daily.   Yes Historical Provider, MD  cloNIDine (CATAPRES) 0.3 MG tablet Take 1 tablet (0.3 mg total) by mouth 2 (two) times daily. 09/14/14  Yes Jolaine Artist, MD  diclofenac sodium (VOLTAREN) 1 % GEL Apply 4 g topically 4 (four) times daily. 09/22/14  Yes April Palumbo, MD  divalproex (DEPAKOTE ER) 500 MG 24 hr tablet Take 500 mg by mouth 2 (two) times daily. 05/29/14  Yes Historical Provider, MD  ferrous sulfate 325 (65 FE) MG tablet Take 325 mg by mouth 2 (two) times daily with a meal.    Yes Historical Provider, MD  furosemide (LASIX) 80 MG tablet Take 80 mg by mouth daily. 10/12/14  Yes Historical Provider, MD  hydrALAZINE (APRESOLINE) 100 MG tablet Take 100 mg by mouth 3 (three) times daily. 05/29/14  Yes Historical Provider, MD  isosorbide mononitrate (IMDUR) 30 MG 24 hr tablet Take 2 tablets (60 mg total) by mouth daily. 07/26/14  Yes Modena Jansky, MD  lisinopril (PRINIVIL,ZESTRIL) 20 MG tablet Take 20 mg by mouth daily. 10/01/14  Yes Historical Provider, MD  metolazone (ZAROXOLYN) 5 MG tablet Take 5 mg by mouth daily. 10/12/14  Yes Historical Provider, MD  pantoprazole (PROTONIX) 40 MG tablet Take 40 mg by mouth daily.    Yes Historical Provider, MD  phenytoin (DILANTIN) 100 MG ER capsule Take 200 mg by mouth at bedtime. 10/01/14  Yes Historical Provider, MD  phenytoin (DILANTIN) 200 MG ER capsule Take 200 mg by mouth daily. 10/01/14  Yes Historical Provider, MD  potassium chloride SA (KLOR-CON M20) 20 MEQ tablet Take 20 mEq by mouth daily. 01/17/10  Yes Historical  Provider, MD  torsemide (DEMADEX) 20 MG tablet Take 2 tablets (40 mg total) by mouth 2 (two) times daily. 09/18/14  Yes Shanker Kristeen Mans, MD  atorvastatin (LIPITOR) 80 MG tablet Take 1 tablet (80 mg total) by mouth daily at 6 PM. 10/07/14   Charlynne Cousins, MD  HYDROcodone-acetaminophen (NORCO/VICODIN) 5-325 MG per tablet Take 2 tablets by mouth every 6 (six) hours as needed for moderate pain. Patient not taking: Reported on 10/15/2014 09/08/14   Ripudeep Krystal Eaton, MD  nitroGLYCERIN (NITROSTAT) 0.4 MG SL tablet Place 1 tablet (0.4 mg total) under the tongue every 5 (five) minutes as needed for chest pain (upto max 3 doses at one time.). Patient not taking: Reported on 10/15/2014 07/26/14   Modena Jansky, MD   BP 172/90 mmHg  Pulse 100  Temp(Src) 98.5 F (36.9 C) (Oral)  Resp 20  Ht 5\' 5"  (1.651 m)  Wt 162 lb 7.7 oz (73.7 kg)  BMI 27.04 kg/m2  SpO2 99% Physical Exam  Constitutional: He is oriented to person, place, and time. He appears well-developed and well-nourished. No distress.  HENT:  Head: Normocephalic and atraumatic.  Mouth/Throat: Oropharynx is clear and moist. No oropharyngeal exudate.  Eyes: Conjunctivae and EOM are normal. Pupils are equal, round, and reactive to light.  Neck: Normal range of motion. Neck supple.  No meningismus.  Cardiovascular: Normal rate, regular rhythm, normal heart sounds and intact distal pulses.   No murmur heard. Pulmonary/Chest: Effort normal and breath sounds normal. No respiratory distress. He exhibits tenderness.  Abdominal: Soft. There is tenderness. There is no rebound and no guarding.  Diffuse tenderness without peritoneal signs  Musculoskeletal: Normal range of motion. He exhibits tenderness. He exhibits no edema.  Diffuse tenderness. FROM bilateral hips and knees. Difficult to feel DP and PT pulses  Neurological: He is alert and oriented to person, place, and time. No cranial nerve deficit. He exhibits normal muscle tone. Coordination  normal.  No ataxia on finger to nose bilaterally. No pronator drift. 5/5 strength throughout. CN 2-12 intact. Equal grip strength. Sensation intact.   Skin: Skin is warm.  Psychiatric: He has a  normal mood and affect. His behavior is normal.  Nursing note and vitals reviewed.   ED Course  Procedures (including critical care time) Labs Review Labs Reviewed  BASIC METABOLIC PANEL - Abnormal; Notable for the following:    Calcium 8.7 (*)    All other components within normal limits  CBC - Abnormal; Notable for the following:    RBC 2.57 (*)    Hemoglobin 6.8 (*)    HCT 22.2 (*)    RDW 16.9 (*)    All other components within normal limits  BRAIN NATRIURETIC PEPTIDE - Abnormal; Notable for the following:    B Natriuretic Peptide 2506.0 (*)    All other components within normal limits  TROPONIN I - Abnormal; Notable for the following:    Troponin I 0.07 (*)    All other components within normal limits  POC OCCULT BLOOD, ED - Abnormal; Notable for the following:    Fecal Occult Bld POSITIVE (*)    All other components within normal limits  URINE RAPID DRUG SCREEN, HOSP PERFORMED  CK  TROPONIN I  TROPONIN I  PROTIME-INR  COMPREHENSIVE METABOLIC PANEL  CBC WITH DIFFERENTIAL/PLATELET  I-STAT TROPOININ, ED  I-STAT CG4 LACTIC ACID, ED  TYPE AND SCREEN  PREPARE RBC (CROSSMATCH)    Imaging Review Dg Chest 2 View  10/15/2014   CLINICAL DATA:  Chest pain for 2 days.  EXAM: CHEST  2 VIEW  COMPARISON:  Chest x-rays dated 10/05/2014 and 08/10/2014. Comparison is also made to a CT angiogram of the neck and chest dated 10/06/2014.  FINDINGS: Cardiomegaly is unchanged. Again noted is central pulmonary vascular congestion and mild bilateral interstitial prominence most suggestive of interstitial edema. No confluent airspace opacity to suggest a developing pneumonia. No pleural effusion seen. No pneumothorax. Median sternotomy wires appear intact and stable in position. No acute osseous  abnormality identified.  IMPRESSION: Cardiomegaly with central pulmonary vascular congestion and mild bilateral interstitial edema suggesting mild volume overload/congestive heart failure. This may be a chronic volume overload/congestive heart failure as the appearance is stable compared to multiple prior exams.   Electronically Signed   By: Franki Cabot M.D.   On: 10/15/2014 15:21   Ct Angio Chest Aorta W/cm &/or Wo/cm  10/15/2014   CLINICAL DATA:  Chest pain.  Shortness of breath.  Low hemoglobin.  EXAM: CT ANGIOGRAPHY CHEST, ABDOMEN AND PELVIS  TECHNIQUE: Multidetector CT imaging through the chest, abdomen and pelvis was performed using the standard protocol during bolus administration of intravenous contrast. Multiplanar reconstructed images and MIPs were obtained and reviewed to evaluate the vascular anatomy.  CONTRAST:  158mL OMNIPAQUE IOHEXOL 350 MG/ML SOLN  COMPARISON:  None.  Chest x-ray dated 10/15/2014 and CT angiogram of the chest abdomen pelvis dated 10/06/2014  FINDINGS: CTA CHEST FINDINGS  There are no pulmonary emboli and there is no aortic dissection. There are scattered calcifications in the thoracic aorta. Extensive coronary artery calcification. Prior CABG. Cardiomegaly, unchanged since the prior exam. The patient has chronic interstitial lung disease with slight interstitial edema at the lung bases, improved since the prior CT scan. Tiny left pleural effusion.  Review of the MIP images confirms the above findings.  CTA ABDOMEN AND PELVIS FINDINGS  Extensive atherosclerosis of the arteries of the abdomen and pelvis. Severe bilateral proximal common iliac artery stenoses. No dissection or aneurysm. Single widely patent renal arteries bilaterally.  Liver, biliary tree, spleen, pancreas, adrenal glands and kidneys are normal except for an exophytic 12 mm hyperdense cyst on the  lateral aspect of the mid left kidney, unchanged in size in densities since 03/08/2013.  The bowel is normal. Bladder is  normal. No acute osseous abnormalities.  Review of the MIP images confirms the above findings.  IMPRESSION: 1. No acute abnormalities of the chest or abdomen. Specifically, no evidence of aortic dissection or pulmonary emboli. 2. Improved slight interstitial pulmonary edema at the lung bases superimposed on chronic interstitial lung disease. 3. Extensive aortic atherosclerosis with severe stenoses of the common iliac arteries bilaterally.   Electronically Signed   By: Lorriane Shire M.D.   On: 10/15/2014 17:29   Ct Cta Abd/pel W/cm &/or W/o Cm  10/15/2014   CLINICAL DATA:  Chest pain.  Shortness of breath.  Low hemoglobin.  EXAM: CT ANGIOGRAPHY CHEST, ABDOMEN AND PELVIS  TECHNIQUE: Multidetector CT imaging through the chest, abdomen and pelvis was performed using the standard protocol during bolus administration of intravenous contrast. Multiplanar reconstructed images and MIPs were obtained and reviewed to evaluate the vascular anatomy.  CONTRAST:  115mL OMNIPAQUE IOHEXOL 350 MG/ML SOLN  COMPARISON:  None.  Chest x-ray dated 10/15/2014 and CT angiogram of the chest abdomen pelvis dated 10/06/2014  FINDINGS: CTA CHEST FINDINGS  There are no pulmonary emboli and there is no aortic dissection. There are scattered calcifications in the thoracic aorta. Extensive coronary artery calcification. Prior CABG. Cardiomegaly, unchanged since the prior exam. The patient has chronic interstitial lung disease with slight interstitial edema at the lung bases, improved since the prior CT scan. Tiny left pleural effusion.  Review of the MIP images confirms the above findings.  CTA ABDOMEN AND PELVIS FINDINGS  Extensive atherosclerosis of the arteries of the abdomen and pelvis. Severe bilateral proximal common iliac artery stenoses. No dissection or aneurysm. Single widely patent renal arteries bilaterally.  Liver, biliary tree, spleen, pancreas, adrenal glands and kidneys are normal except for an exophytic 12 mm hyperdense cyst on  the lateral aspect of the mid left kidney, unchanged in size in densities since 03/08/2013.  The bowel is normal. Bladder is normal. No acute osseous abnormalities.  Review of the MIP images confirms the above findings.  IMPRESSION: 1. No acute abnormalities of the chest or abdomen. Specifically, no evidence of aortic dissection or pulmonary emboli. 2. Improved slight interstitial pulmonary edema at the lung bases superimposed on chronic interstitial lung disease. 3. Extensive aortic atherosclerosis with severe stenoses of the common iliac arteries bilaterally.   Electronically Signed   By: Lorriane Shire M.D.   On: 10/15/2014 17:29     EKG Interpretation   Date/Time:  Thursday October 15 2014 14:31:30 EDT Ventricular Rate:  94 PR Interval:  178 QRS Duration: 114 QT Interval:  352 QTC Calculation: 440 R Axis:   76 Text Interpretation:  Normal sinus rhythm Inferior-posterior infarct , age  undetermined ST \\T \ T wave abnormality, consider lateral ischemia Abnormal  ECG No significant change was found Confirmed by Wyvonnia Dusky  MD, Rafik Koppel  [phone]) on 10/15/2014 4:29:27 PM      MDM   Final diagnoses:  Chest pain, unspecified chest pain type  Gastrointestinal hemorrhage, unspecified gastritis, unspecified gastrointestinal hemorrhage type   Left-sided chest pain since this morning that is constant and associated with abdominal pain and bilateral leg pain. Similar to previous. Denies cocaine in the past one week.   EKG is unchanged. Chest x-ray shows congestion which is also unchanged. Troponin negative. Hemoglobin is 6.8. Hemoccult-positive. 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 Patient also underwent EGD and  colonoscopy at [facility] in July 2016 that was reportedly negative.  CT negative for dissection or pulmonary embolism. No acute findings. Distal pulses are obtainable by Doppler.  RBCs ordered.  Chest pain likely multifactorial from anemia and cocaine abuse.  Admission dw Dr.  Hal Hope.   Ezequiel Essex, MD 10/16/14 [phone]

## 2014-10-15 NOTE — H&P (Signed)
[facility] History and Physical  Charles [name] ZJI:[id number] DOB: 1945-06-30 DOA: 10/15/2014  Referring physician: Dr. Wyvonnia Dusky, PCP: No PCP Per Patient  Specialists: Dr.Bensimon.  Chief Complaint: Chest pain.  HPI: Charles [name] is a 69 y.o. male with history of CAD status post CABG last Myoview in September 2015 was negative for ischemia, diastolic CHF last EF measured as 45-50% in March 2016 with grade 2 diastolic dysfunction, GI bleed with gastric AVMs last EGD in March 2016 was recently admitted for chest pain and was managed conservatively given patient's history of cocaine use pressors to the ER because of chest pain. Patient states patient has been having chest pain last 2 days which was retrosternal throbbing in nature increased on exertion and was associated with shortness of breath. In addition patient also stated that he had 2 episodes of bloody bowel movement yesterday. In the ER patient was found to have normal troponin and had undergone CT chest and abdomen which only showed stenosis of the iliac arteries. EKG shows nonspecific ST changes and troponin was negative. Hemoglobin was around 6 and stool for occult blood has been positive. Patient has been admitted for further management of GI bleed and chest pain. Patient states he last took cocaine 1 week ago. On exam patient is also mildly short of breath. Patient states he has been compliant with his medications. Denies using any NSAIDs and patient is on baby aspirin.   Review of Systems: As presented in the history of presenting illness, rest negative.  Past Medical History  Diagnosis Date  . Iron deficiency anemia   . Chronic combined systolic and diastolic CHF, NYHA class 2     a. 11/2013 Echo: EF 40-45%, basl-mid inflat AK, Gr 2 DD.  [name] Hyperlipidemia LDL goal <70   . Ischemic cardiomyopathy     a. 11/2013 Echo: EF 40-45%, basl-mid inflat AK, Gr 2 DD, mild AI, mod dil LA/RA, mod reduced RV fxn, PASP 25mmHg.  .  Moderate to severe pulmonary hypertension 03/2010    a. PA peak pressure of 76 mmHg (per 2D Echo 03/2010)  . Polysubstance abuse     a. cocaine, THC  . AV malformation of gastrointestinal tract     a. w/ h/o GIB.  [name] Family history of early CAD   . Peripheral vascular disease   . Seizure disorder   . Hypertension   . CAD (coronary artery disease)     a. s/p 3-vessel CABG (12/2007) // 100% RCA stenosis with collaterals from left system. Severe bifurcation lesions of proxima CXA and OM. Moderate LAD disease - followed by Dr. Wyline Copas in [facility];  b. 11/2013 Myoview: Large fixed inf defect w/o ischemia, EF 35%.  . Chronic kidney disease (CKD), stage III (moderate)     a. BL SCr 1.5-1.6  . Renal artery stenosis   . Cancer     "I have cancer; right now it's not known what kind" (09/17/2014)  . Myocardial infarction 08/2007; 12/2007  . Anginal pain   . COPD (chronic obstructive pulmonary disease)   . Pneumonia     "I've had pneumonia once" (09/17/2014)  . Obstructive sleep apnea     "never told me to wear mask" (09/17/2014)  . History of blood transfusion "I've had quite a few"    "they don't know where it's coming from" (09/17/2014)  . History of hiatal hernia   . Seizures     "I've had a few; don't know what kind they call it" (09/17/2014)  .  DDD (degenerative disc disease), lumbar   . Arthritis     "ankles; left hip; wrists" (09/17/2014)  . Chronic lower back pain   . Depression   . Bipolar disorder   . Schizophrenia    Past Surgical History  Procedure Laterality Date  . Apc  03/2010    To treat small bowel AVMs  . Esophagogastroduodenoscopy N/A 08/14/2012    Procedure: ESOPHAGOGASTRODUODENOSCOPY (EGD);  Surgeon: [name], MD;  Location: [facility] ENDOSCOPY;  Service: Endoscopy;  Laterality: N/A;  . Hot hemostasis N/A 08/14/2012    Procedure: HOT HEMOSTASIS (ARGON PLASMA COAGULATION/BICAP);  Surgeon: [name], MD;  Location: [facility] ENDOSCOPY;  Service: Endoscopy;  Laterality: N/A;  .  Esophagogastroduodenoscopy (egd) with propofol N/A 08/04/2013    Procedure: ESOPHAGOGASTRODUODENOSCOPY (EGD) WITH PROPOFOL;  Surgeon: [name], MD;  Location: [facility] ENDOSCOPY;  Service: Endoscopy;  Laterality: N/A;  . Esophagogastroduodenoscopy (egd) with propofol N/A 06/08/2014    Procedure: ESOPHAGOGASTRODUODENOSCOPY (EGD) WITH PROPOFOL;  Surgeon: Milus Banister, MD;  Location: [place];  Service: Endoscopy;  Laterality: N/A;  . Peripheral vascular catheterization N/A 09/07/2014    Procedure: Abdominal Aortogram;  Surgeon: Angelia Mould, MD;  Location: [place] CV LAB;  Service: Cardiovascular;  Laterality: N/A;  . Coronary artery bypass graft  12/2007    "CABG X3"  . Coronary angioplasty with stent placement  08/2007   Social History:  reports that he has quit smoking. His smoking use included Cigarettes. He has a 2.5 pack-year smoking history. He has never used smokeless tobacco. He reports that he drinks alcohol. He reports that he uses illicit drugs (Cocaine and Marijuana) about once per week. Where does patient live home. Can patient participate in ADLs? Yes.  Allergies  Allergen Reactions  . Motrin [Ibuprofen] Other (See Comments)    Affects kidneys  . Tylenol [Acetaminophen] Other (See Comments)    Affects kidneys    Family History:  Family History  Problem Relation Age of Onset  . Heart disease Mother     unknown type  . Heart disease Father 10    died of MI at 76yo  . Heart disease Paternal Grandfather 31    died of MI  . Heart disease    . Heart disease Brother 30      Prior to Admission medications   Medication Sig Start Date End Date Taking? Authorizing Provider  albuterol (PROVENTIL HFA;VENTOLIN HFA) 108 (90 BASE) MCG/ACT inhaler Inhale 2 puffs into the lungs every 6 (six) hours as needed for wheezing or shortness of breath. 04/30/14  Yes Thurnell Lose, MD  amLODipine (NORVASC) 10 MG tablet Take 1 tablet (10 mg total) by mouth daily. 09/18/14  Yes  Shanker Kristeen Mans, MD  aspirin EC 81 MG tablet Take 1 tablet (81 mg total) by mouth daily. 09/18/14  Yes Shanker Kristeen Mans, MD  budesonide-formoterol (SYMBICORT) 80-4.5 MCG/ACT inhaler Inhale 2 puffs into the lungs 2 (two) times daily. 09/08/14  Yes Ripudeep Krystal Eaton, MD  citalopram (CELEXA) 20 MG tablet Take 20 mg by mouth daily.   Yes Historical Provider, MD  cloNIDine (CATAPRES) 0.3 MG tablet Take 1 tablet (0.3 mg total) by mouth 2 (two) times daily. 09/14/14  Yes Jolaine Artist, MD  diclofenac sodium (VOLTAREN) 1 % GEL Apply 4 g topically 4 (four) times daily. 09/22/14  Yes April Palumbo, MD  divalproex (DEPAKOTE ER) 500 MG 24 hr tablet Take 500 mg by mouth 2 (two) times daily. 05/29/14  Yes Historical Provider, MD  ferrous  sulfate 325 (65 FE) MG tablet Take 325 mg by mouth 2 (two) times daily with a meal.    Yes Historical Provider, MD  furosemide (LASIX) 80 MG tablet Take 80 mg by mouth daily. 10/12/14  Yes Historical Provider, MD  hydrALAZINE (APRESOLINE) 100 MG tablet Take 100 mg by mouth 3 (three) times daily. 05/29/14  Yes Historical Provider, MD  isosorbide mononitrate (IMDUR) 30 MG 24 hr tablet Take 2 tablets (60 mg total) by mouth daily. 07/26/14  Yes Modena Jansky, MD  lisinopril (PRINIVIL,ZESTRIL) 20 MG tablet Take 20 mg by mouth daily. 10/01/14  Yes Historical Provider, MD  metolazone (ZAROXOLYN) 5 MG tablet Take 5 mg by mouth daily. 10/12/14  Yes Historical Provider, MD  pantoprazole (PROTONIX) 40 MG tablet Take 40 mg by mouth daily.    Yes Historical Provider, MD  phenytoin (DILANTIN) 100 MG ER capsule Take 200 mg by mouth at bedtime. 10/01/14  Yes Historical Provider, MD  phenytoin (DILANTIN) 200 MG ER capsule Take 200 mg by mouth daily. 10/01/14  Yes Historical Provider, MD  potassium chloride SA (KLOR-CON M20) 20 MEQ tablet Take 20 mEq by mouth daily. 01/17/10  Yes Historical Provider, MD  torsemide (DEMADEX) 20 MG tablet Take 2 tablets (40 mg total) by mouth 2 (two) times daily. 09/18/14   Yes Shanker Kristeen Mans, MD  atorvastatin (LIPITOR) 80 MG tablet Take 1 tablet (80 mg total) by mouth daily at 6 PM. 10/07/14   Charlynne Cousins, MD  HYDROcodone-acetaminophen (NORCO/VICODIN) 5-325 MG per tablet Take 2 tablets by mouth every 6 (six) hours as needed for moderate pain. Patient not taking: Reported on 10/15/2014 09/08/14   Ripudeep Krystal Eaton, MD  nitroGLYCERIN (NITROSTAT) 0.4 MG SL tablet Place 1 tablet (0.4 mg total) under the tongue every 5 (five) minutes as needed for chest pain (upto max 3 doses at one time.). Patient not taking: Reported on 10/15/2014 07/26/14   Modena Jansky, MD    Physical Exam: Filed Vitals:   10/15/14 1915 10/15/14 1929 10/15/14 1945 10/15/14 2021  BP: 180/99 180/99 172/103 175/85  Pulse: 100 97 104 109  Temp:  98 F (36.7 C)  98.3 F (36.8 C)  TempSrc:  Oral  Oral  Resp:  16 20 21   Weight:    73.7 kg (162 lb 7.7 oz)  SpO2: 99% 100% 100% 98%     General:  Moderately built and nourished.  Eyes: Anicteric no pallor.  ENT: No discharge from the ears eyes nose and mouth.  Neck: No mass felt.  Cardiovascular: S1-S2 heard.  Respiratory: No rhonchi or crepitations.  Abdomen: Soft nontender bowel sounds present.  Skin: No rash.  Musculoskeletal: No edema.  Psychiatric: Appears normal.  Neurologic: Alert awake oriented to time place and person. Moves all extremities.  Labs on Admission:  Basic Metabolic Panel:  Recent Labs Lab 10/15/14 1536  NA 137  K 4.3  CL 106  CO2 23  GLUCOSE 80  BUN 16  CREATININE 1.06  CALCIUM 8.7*   Liver Function Tests: No results for input(s): AST, ALT, ALKPHOS, BILITOT, PROT, ALBUMIN in the last 168 hours. No results for input(s): LIPASE, AMYLASE in the last 168 hours. No results for input(s): AMMONIA in the last 168 hours. CBC:  Recent Labs Lab 10/15/14 1536  WBC 5.1  HGB 6.8*  HCT 22.2*  MCV 86.4  PLT 326   Cardiac Enzymes:  Recent Labs Lab 10/15/14 1536  CKTOTAL 50    BNP (last 3  results)  Recent Labs  09/15/14 1430 10/05/14 2201 10/15/14 1632  BNP 3756.9* 748.5* 2506.0*    ProBNP (last 3 results)  Recent Labs  01/01/14 1107 02/07/14 0123 03/01/14 2250  PROBNP 3225.0* 2648.0* 7421.0*    CBG: No results for input(s): GLUCAP in the last 168 hours.  Radiological Exams on Admission: Dg Chest 2 View  10/15/2014   CLINICAL DATA:  Chest pain for 2 days.  EXAM: CHEST  2 VIEW  COMPARISON:  Chest x-rays dated 10/05/2014 and 08/10/2014. Comparison is also made to a CT angiogram of the neck and chest dated 10/06/2014.  FINDINGS: Cardiomegaly is unchanged. Again noted is central pulmonary vascular congestion and mild bilateral interstitial prominence most suggestive of interstitial edema. No confluent airspace opacity to suggest a developing pneumonia. No pleural effusion seen. No pneumothorax. Median sternotomy wires appear intact and stable in position. No acute osseous abnormality identified.  IMPRESSION: Cardiomegaly with central pulmonary vascular congestion and mild bilateral interstitial edema suggesting mild volume overload/congestive heart failure. This may be a chronic volume overload/congestive heart failure as the appearance is stable compared to multiple prior exams.   Electronically Signed   By: Franki Cabot M.D.   On: 10/15/2014 15:21   Ct Angio Chest Aorta W/cm &/or Wo/cm  10/15/2014   CLINICAL DATA:  Chest pain.  Shortness of breath.  Low hemoglobin.  EXAM: CT ANGIOGRAPHY CHEST, ABDOMEN AND PELVIS  TECHNIQUE: Multidetector CT imaging through the chest, abdomen and pelvis was performed using the standard protocol during bolus administration of intravenous contrast. Multiplanar reconstructed images and MIPs were obtained and reviewed to evaluate the vascular anatomy.  CONTRAST:  181mL OMNIPAQUE IOHEXOL 350 MG/ML SOLN  COMPARISON:  None.  Chest x-ray dated 10/15/2014 and CT angiogram of the chest abdomen pelvis dated 10/06/2014  FINDINGS: CTA CHEST FINDINGS   There are no pulmonary emboli and there is no aortic dissection. There are scattered calcifications in the thoracic aorta. Extensive coronary artery calcification. Prior CABG. Cardiomegaly, unchanged since the prior exam. The patient has chronic interstitial lung disease with slight interstitial edema at the lung bases, improved since the prior CT scan. Tiny left pleural effusion.  Review of the MIP images confirms the above findings.  CTA ABDOMEN AND PELVIS FINDINGS  Extensive atherosclerosis of the arteries of the abdomen and pelvis. Severe bilateral proximal common iliac artery stenoses. No dissection or aneurysm. Single widely patent renal arteries bilaterally.  Liver, biliary tree, spleen, pancreas, adrenal glands and kidneys are normal except for an exophytic 12 mm hyperdense cyst on the lateral aspect of the mid left kidney, unchanged in size in densities since 03/08/2013.  The bowel is normal. Bladder is normal. No acute osseous abnormalities.  Review of the MIP images confirms the above findings.  IMPRESSION: 1. No acute abnormalities of the chest or abdomen. Specifically, no evidence of aortic dissection or pulmonary emboli. 2. Improved slight interstitial pulmonary edema at the lung bases superimposed on chronic interstitial lung disease. 3. Extensive aortic atherosclerosis with severe stenoses of the common iliac arteries bilaterally.   Electronically Signed   By: Lorriane Shire M.D.   On: 10/15/2014 17:29   Ct Cta Abd/pel W/cm &/or W/o Cm  10/15/2014   CLINICAL DATA:  Chest pain.  Shortness of breath.  Low hemoglobin.  EXAM: CT ANGIOGRAPHY CHEST, ABDOMEN AND PELVIS  TECHNIQUE: Multidetector CT imaging through the chest, abdomen and pelvis was performed using the standard protocol during bolus administration of intravenous contrast. Multiplanar reconstructed images and MIPs were obtained and reviewed to  evaluate the vascular anatomy.  CONTRAST:  136mL OMNIPAQUE IOHEXOL 350 MG/ML SOLN  COMPARISON:   None.  Chest x-ray dated 10/15/2014 and CT angiogram of the chest abdomen pelvis dated 10/06/2014  FINDINGS: CTA CHEST FINDINGS  There are no pulmonary emboli and there is no aortic dissection. There are scattered calcifications in the thoracic aorta. Extensive coronary artery calcification. Prior CABG. Cardiomegaly, unchanged since the prior exam. The patient has chronic interstitial lung disease with slight interstitial edema at the lung bases, improved since the prior CT scan. Tiny left pleural effusion.  Review of the MIP images confirms the above findings.  CTA ABDOMEN AND PELVIS FINDINGS  Extensive atherosclerosis of the arteries of the abdomen and pelvis. Severe bilateral proximal common iliac artery stenoses. No dissection or aneurysm. Single widely patent renal arteries bilaterally.  Liver, biliary tree, spleen, pancreas, adrenal glands and kidneys are normal except for an exophytic 12 mm hyperdense cyst on the lateral aspect of the mid left kidney, unchanged in size in densities since 03/08/2013.  The bowel is normal. Bladder is normal. No acute osseous abnormalities.  Review of the MIP images confirms the above findings.  IMPRESSION: 1. No acute abnormalities of the chest or abdomen. Specifically, no evidence of aortic dissection or pulmonary emboli. 2. Improved slight interstitial pulmonary edema at the lung bases superimposed on chronic interstitial lung disease. 3. Extensive aortic atherosclerosis with severe stenoses of the common iliac arteries bilaterally.   Electronically Signed   By: Lorriane Shire M.D.   On: 10/15/2014 17:29    EKG: Independently reviewed. Normal sinus rhythm with diffuse ST changes.  Assessment/Plan Active Problems:   Hypertension   CAD- CABG x3 '09. Myoview no ischemia 11/27/13   Chronic kidney disease (CKD), stage III (moderate)   GI bleed   Chest pain   Chronic diastolic heart failure   Pain in the chest   Bleeding gastrointestinal   1. Chest pain with history  of CAD status post CABG - patient's hemoglobin has dropped from his baseline which could have further worsened his chest pain. Patient is also having GI bleed so we cannot place patient on any aspirin or anticoagulation. We will transfuse PRBC and recheck hemoglobin and cycle cardiac markers. Will keep patient nothing by mouth in anticipation of procedures. 2. GI bleed history of gastric AVMs per endoscopy in March 2016 - I have placed patient on Protonix infusion. Patient is receiving 1 unit of packed red blood cell transfusion. Recheck hemoglobin after transfusion. Hold aspirin due to GI bleed. 3. Diastolic CHF last EF measured in March 2016 was 45-50% - I have placed patient on IV Lasix as patient has shortness of breath CT chest also shows pulmonary edema. Closely follow intake and output daily weights and metabolic panel. 4. Lower extremity pain - patient complains of left lower extremity pain. Patient does have a history of peripheral arterial disease. There is no obvious swelling. I have ordered ABI. At this time patient does not have any signs of acute ischemia. 5. History of seizures - continue present medications. 6. Polysubstance abuse - patient states he has not had any cocaine, last one week. Check urine drug screen. 7. Hypertension - continue present medications.  I have reviewed patient's chest x-ray personally and revealed old labs and chart.   DVT Prophylaxis SCDs.  Code Status: Full code.  Family Communication: Discussed with patient.  Disposition Plan: Admit to inpatient.    Garcia Dalzell N. [facility] Pager [phone].  If 7PM-7AM, please contact night-coverage [email or web address]  Password [email or web address] 10/15/2014, 9:08 PM

## 2014-10-15 NOTE — ED Notes (Signed)
EMS stated, bilateral leg pain for the last few were it wakes him u.  Pt. Ambulatory, ask for a wheelchair when he got here.

## 2014-10-15 NOTE — ED Notes (Signed)
Pt reports to the ED for eval of CP and bilateral leg pain that started yesterday. Pt reports he was just resting when the symptoms began. Reports associated symptoms of N/V, SOB, and lightheadedness. Pt reports the leg pain is worse with weight bearing. Describes the pain as an aching. Also reports blood in his stools. Hx of blood transfusion 1 month ago. Pt A&Ox4, resp e/u, and skin warm and dry.

## 2014-10-15 NOTE — ED Notes (Signed)
  Pt transported to ct 

## 2014-10-15 NOTE — Progress Notes (Signed)
Pt arrived to room 2w33 via stretcher from ED; A/O x 4, in no apparent distress; oriented to room/unit/staff; safety/fall risk prevention plan discussed, instructed to call for assistance prior to getting OOB; bed alarm on, call bell within reach; see flowsheets for full assessment Blair Hailey, RN

## 2014-10-16 ENCOUNTER — Inpatient Hospital Stay (HOSPITAL_COMMUNITY): Payer: Medicare Other

## 2014-10-16 ENCOUNTER — Encounter (HOSPITAL_COMMUNITY)
Admission: EM | Disposition: A | Discharge status: Home / Self Care | Payer: Self-pay | Source: Home / Self Care | Attending: Internal Medicine

## 2014-10-16 ENCOUNTER — Encounter (HOSPITAL_COMMUNITY): Payer: Self-pay | Admitting: *Deleted

## 2014-10-16 ENCOUNTER — Inpatient Hospital Stay (HOSPITAL_COMMUNITY): Payer: Medicare Other | Admitting: Anesthesiology

## 2014-10-16 ENCOUNTER — Ambulatory Visit (HOSPITAL_COMMUNITY): Payer: Medicare Other

## 2014-10-16 DIAGNOSIS — D509 Iron deficiency anemia, unspecified: Secondary | ICD-10-CM | POA: Insufficient documentation

## 2014-10-16 DIAGNOSIS — K31811 Angiodysplasia of stomach and duodenum with bleeding: Secondary | ICD-10-CM

## 2014-10-16 DIAGNOSIS — R195 Other fecal abnormalities: Secondary | ICD-10-CM

## 2014-10-16 DIAGNOSIS — R079 Chest pain, unspecified: Secondary | ICD-10-CM

## 2014-10-16 HISTORY — PX: ENTEROSCOPY: SHX5533

## 2014-10-16 LAB — COMPREHENSIVE METABOLIC PANEL
ALK PHOS: 68 U/L (ref 38–126)
ALT: 11 U/L — ABNORMAL LOW (ref 17–63)
ANION GAP: 8 (ref 5–15)
AST: 21 U/L (ref 15–41)
Albumin: 2.9 g/dL — ABNORMAL LOW (ref 3.5–5.0)
BUN: 15 mg/dL (ref 6–20)
CALCIUM: 8.2 mg/dL — AB (ref 8.9–10.3)
CO2: 23 mmol/L (ref 22–32)
Chloride: 104 mmol/L (ref 101–111)
Creatinine, Ser: 1.08 mg/dL (ref 0.61–1.24)
GFR calc Af Amer: >60 mL/min (ref >60)
GLUCOSE: 89 mg/dL (ref 65–99)
POTASSIUM: 3.8 mmol/L (ref 3.5–5.1)
Sodium: 135 mmol/L (ref 135–145)
TOTAL PROTEIN: 6.5 g/dL (ref 6.5–8.1)
Total Bilirubin: 0.5 mg/dL (ref 0.3–1.2)

## 2014-10-16 LAB — CBC WITH DIFFERENTIAL/PLATELET
BASOS ABS: 0 10*3/uL (ref 0.0–0.1)
Basophils Relative: 1 % (ref 0–1)
EOS PCT: 3 % (ref 0–5)
Eosinophils Absolute: 0.1 10*3/uL (ref 0.0–0.7)
HEMATOCRIT: 25.5 % — AB (ref 39.0–52.0)
Hemoglobin: 8 g/dL — ABNORMAL LOW (ref 13.0–17.0)
LYMPHS ABS: 0.7 10*3/uL (ref 0.7–4.0)
Lymphocytes Relative: 16 % (ref 12–46)
MCH: 27.5 pg (ref 26.0–34.0)
MCHC: 31.4 g/dL (ref 30.0–36.0)
MCV: 87.6 fL (ref 78.0–100.0)
MONOS PCT: 9 % (ref 3–12)
Monocytes Absolute: 0.4 10*3/uL (ref 0.1–1.0)
Neutro Abs: 3.2 10*3/uL (ref 1.7–7.7)
Neutrophils Relative %: 71 % (ref 43–77)
Platelets: 278 10*3/uL (ref 150–400)
RBC: 2.91 MIL/uL — ABNORMAL LOW (ref 4.22–5.81)
RDW: 16.6 % — ABNORMAL HIGH (ref 11.5–15.5)
WBC: 4.4 10*3/uL (ref 4.0–10.5)

## 2014-10-16 LAB — PROTIME-INR
INR: 1.14 (ref 0.00–1.49)
Prothrombin Time: 14.8 seconds (ref 11.6–15.2)

## 2014-10-16 LAB — TROPONIN I
TROPONIN I: 0.06 ng/mL — AB (ref <0.031)
Troponin I: 0.06 ng/mL — ABNORMAL HIGH (ref <0.031)

## 2014-10-16 SURGERY — ENTEROSCOPY
Anesthesia: Monitor Anesthesia Care

## 2014-10-16 MED ORDER — PROPOFOL 10 MG/ML IV BOLUS
INTRAVENOUS | Status: DC | PRN
  Administered 2014-10-16: 10 mg via INTRAVENOUS

## 2014-10-16 MED ORDER — PANTOPRAZOLE SODIUM 40 MG PO TBEC
40.0000 mg | DELAYED_RELEASE_TABLET | Freq: Every day | ORAL | Status: DC
  Administered 2014-10-17 – 2014-10-18 (×2): 40 mg via ORAL
  Filled 2014-10-16 (×2): qty 1

## 2014-10-16 MED ORDER — PANTOPRAZOLE SODIUM 40 MG IV SOLR
40.0000 mg | Freq: Two times a day (BID) | INTRAVENOUS | Status: DC
  Filled 2014-10-16 (×2): qty 40

## 2014-10-16 MED ORDER — SODIUM CHLORIDE 0.9 % IV SOLN
INTRAVENOUS | Status: DC
  Administered 2014-10-16: 20 mL/h via INTRAVENOUS

## 2014-10-16 MED ORDER — ENSURE ENLIVE PO LIQD
237.0000 mL | Freq: Three times a day (TID) | ORAL | Status: DC
  Administered 2014-10-16 – 2014-10-18 (×6): 237 mL via ORAL

## 2014-10-16 MED ORDER — MORPHINE SULFATE 2 MG/ML IJ SOLN
1.0000 mg | Freq: Once | INTRAMUSCULAR | Status: AC
  Administered 2014-10-16: 1 mg via INTRAVENOUS
  Filled 2014-10-16: qty 1

## 2014-10-16 MED ORDER — MORPHINE SULFATE 2 MG/ML IJ SOLN
1.0000 mg | Freq: Once | INTRAMUSCULAR | Status: AC
  Administered 2014-10-16: 1 mg via INTRAVENOUS

## 2014-10-16 MED ORDER — FENTANYL CITRATE (PF) 100 MCG/2ML IJ SOLN
INTRAMUSCULAR | Status: DC | PRN
  Administered 2014-10-16 (×2): 50 ug via INTRAVENOUS

## 2014-10-16 MED ORDER — NITROGLYCERIN IN D5W 200-5 MCG/ML-% IV SOLN
0.0000 ug/min | INTRAVENOUS | Status: DC
  Administered 2014-10-16: 5 ug/min via INTRAVENOUS
  Filled 2014-10-16: qty 250

## 2014-10-16 MED ORDER — ISOSORBIDE MONONITRATE ER 60 MG PO TB24
60.0000 mg | ORAL_TABLET | Freq: Every day | ORAL | Status: DC
  Administered 2014-10-16 – 2014-10-18 (×3): 60 mg via ORAL
  Filled 2014-10-16 (×3): qty 1

## 2014-10-16 MED ORDER — LACTATED RINGERS IV SOLN
INTRAVENOUS | Status: DC
  Administered 2014-10-16: 13:00:00 via INTRAVENOUS

## 2014-10-16 MED ORDER — GLUCAGON HCL RDNA (DIAGNOSTIC) 1 MG IJ SOLR
INTRAMUSCULAR | Status: AC
  Filled 2014-10-16: qty 1

## 2014-10-16 MED ORDER — PROPOFOL INFUSION 10 MG/ML OPTIME
INTRAVENOUS | Status: DC | PRN
  Administered 2014-10-16: 25 ug/kg/min via INTRAVENOUS

## 2014-10-16 MED ORDER — MIDAZOLAM HCL 5 MG/5ML IJ SOLN
INTRAMUSCULAR | Status: DC | PRN
  Administered 2014-10-16: 2 mg via INTRAVENOUS

## 2014-10-16 NOTE — Anesthesia Procedure Notes (Signed)
Procedure Name: MAC Date/Time: 10/16/2014 1:15 PM Performed by: Scheryl Darter Pre-anesthesia Checklist: Patient identified, Emergency Drugs available, Suction available, Patient being monitored and Timeout performed Patient Re-evaluated:Patient Re-evaluated prior to inductionOxygen Delivery Method: Nasal cannula Intubation Type: IV induction Placement Confirmation: positive ETCO2

## 2014-10-16 NOTE — Progress Notes (Signed)
[employer] came by to deliver ankle bracelet charger.  Officer states that patient know he needs to charge for 2 hours between 1900-2200.  Officer states that judge states that being in hospital does not change this.  Patient states that he spoke with officer earlier and knows what he is supposed to do.  I placed charger on bedside table.  Patient resting and will take care of this. Pt resting with call bell within reach.  Will continue to monitor. Payton Emerald, RN

## 2014-10-16 NOTE — Anesthesia Postprocedure Evaluation (Signed)
  Anesthesia Post-op Note  Patient: Charles [name]  Procedure(s) Performed: Procedure(s): ENTEROSCOPY (N/A)  Patient Location: Endoscopy Unit  Anesthesia Type:MAC  Level of Consciousness: awake  Airway and Oxygen Therapy: Patient Spontanous Breathing and Patient connected to nasal cannula oxygen  Post-op Pain: none  Post-op Assessment: Post-op Vital signs reviewed, Patient's Cardiovascular Status Stable, Respiratory Function Stable, Patent Airway, No signs of Nausea or vomiting and Pain level controlled              Post-op Vital Signs: Reviewed and stable  Last Vitals:  Filed Vitals:   10/16/14 1401  BP: 153/91  Pulse:   Temp:   Resp: 20    Complications: No apparent anesthesia complications

## 2014-10-16 NOTE — Consult Note (Signed)
[facility] Consult: 10:47 AM 10/16/2014  LOS: 1 day    Referring Provider: Dr Curly Rim Primary Care Physician:  No PCP Per Patient Primary Gastroenterologist:  Althia Forts.      Reason for Consultation:  Anemia, recurrent.  Hematochezia.    HPI: Charles [name] is a 69 y.o. male.  Hx diastolic/systolic heart failure. CAD and CABG.  11/2013 cardiac myoview with no ischemia. EF 45 to 50% by echo 05/2014 . Stage 3 CKD.  Pulm htn.  Chronic back pain.  Polysubstance abuse (used cocaine last week). Testing for hepatitis C antibody 08/30/14 was greater than 11 but it was rechecked on the 13th and it was 0.1. Hx iron deficiency anemia and chronic FOBT + stool.  06/08/14  Enteroscopy to prox jejunum with APC treatment to single, cherry red gastric AVM  07/2013 Enteroscopy, Dr. Fuller Plan.  Antral AVM treated with APC; remainder of study was normal. 07/2012 EGD, Dr. Collene Mares: small patch of mucosa in the mid-body with spotty bleeding treated with APC. 03/2010 EGD, Dr. Benson Norway:  hiatal hernia 03/2010 colonoscopy, Dr Benson Norway: normal except for internal hemorrhoids  Has not had capsule endoscopy.  Patient received blood transfusions x 2 units on 09/09/14.  2 units on 06/10/14.  One unit on 05/26/14. Has also received multiple blood transfusions at [facility] in May 2016 and 09/28/2014   Patient was admitted overnight at [facility] last week. Patient is a poor historian but does remember being told by the attending physician that he would need to be transferred to [facility] for a specialized GI procedure. However, the patient left AMA because of a housing crisis wherein he might have lost his residence. Patient does not recall having received transfusion at [facility].  He can't tell me whether he was having  darker than usual stools. He doesn't recall having had red blood per rectum last week.  In "care everywhere" it is noted that he was admitted with GI bleed, chest pain, recent cocaine use and noncompliance with medications. Dr. Dorrene German of GI suggested outpatient follow-up for double balloon enteroscopy at [facility].  He also was treated for CHF and habitus slight bump in his troponins. Also noted he received transfusions with blood products on 711/2016  Yesterday morning the patient had a dark stool in which she saw some red blood. A couple of other episodes during the day where the stools were dark and he saw deeper red blood on the toilet paper with wiping. The last of these was yesterday evening and he has not had any more bowel movements since. Normally he has dark colored but not grossly bloody stools about 1-2 times a day. Yesterday there was some moderate abdominal discomfort but it was not severe. No nausea or vomiting. Generally for the last several months appetite has been reduced.  Patient claims he is lost 20+ pounds since June/2016. However in reviewing hospital recorded weights, his weight is only down 5 pounds from earlier this month.  Readmitted 7/28 with chest pain, hypertension, elevated Troponins ("supply/demand mismatch" per Dr Mare Ferrari)  in setting of Hgb of 6. S/p PRBC x 2, Hgb now 8.0. Chest pain resolved. CT angio chest, abdomen and pelvis 10/15/14: no PE, no aoritc dissection. Marked atherosclerosis of abdomino/pelvic arteries with severe, bilateral common iliac stenosis.  No colitis.   Patient takes his low-dose aspirin as well as his Protonix and once daily iron (it is prescribed twice daily). He denies use of Goody's/BCCs/Advil/Motrin etc. He says he doesn't use cocaine very often but then he said he last used it last week. He admits to drinking beer on occasion and had 1 or 2 beers 2 days ago.  Past Medical History  Diagnosis Date  . Iron deficiency anemia   . Chronic combined  systolic and diastolic CHF, NYHA class 2     a. 11/2013 Echo: EF 40-45%, basl-mid inflat AK, Gr 2 DD.  [name] Hyperlipidemia LDL goal <70   . Ischemic cardiomyopathy     a. 11/2013 Echo: EF 40-45%, basl-mid inflat AK, Gr 2 DD, mild AI, mod dil LA/RA, mod reduced RV fxn, PASP 67mmHg.  . Moderate to severe pulmonary hypertension 03/2010    a. PA peak pressure of 76 mmHg (per 2D Echo 03/2010)  . Polysubstance abuse     a. cocaine, THC  . AV malformation of gastrointestinal tract     a. w/ h/o GIB.  [name] Family history of early CAD   . Peripheral vascular disease   . Seizure disorder   . Hypertension   . CAD (coronary artery disease)     a. s/p 3-vessel CABG (12/2007) // 100% RCA stenosis with collaterals from left system. Severe bifurcation lesions of proxima CXA and OM. Moderate LAD disease - followed by Dr. Wyline Copas in [facility];  b. 11/2013 Myoview: Large fixed inf defect w/o ischemia, EF 35%.  . Chronic kidney disease (CKD), stage III (moderate)     a. BL SCr 1.5-1.6  . Renal artery stenosis   . Cancer     "I have cancer; right now it's not known what kind" (09/17/2014)  . Myocardial infarction 08/2007; 12/2007  . Anginal pain   . COPD (chronic obstructive pulmonary disease)   . Pneumonia     "I've had pneumonia once" (09/17/2014)  . Obstructive sleep apnea     "never told me to wear mask" (09/17/2014)  . History of blood transfusion "I've had quite a few"    "they don't know where it's coming from" (09/17/2014)  . History of hiatal hernia   . Seizures     "I've had a few; don't know what kind they call it" (09/17/2014)  . DDD (degenerative disc disease), lumbar   . Arthritis     "ankles; left hip; wrists" (09/17/2014)  . Chronic lower back pain   . Depression   . Bipolar disorder   . Schizophrenia     Past Surgical History  Procedure Laterality Date  . Apc  03/2010    To treat small bowel AVMs  . Esophagogastroduodenoscopy N/A 08/14/2012    Procedure: ESOPHAGOGASTRODUODENOSCOPY (EGD);   Surgeon: [name], MD;  Location: [facility] ENDOSCOPY;  Service: Endoscopy;  Laterality: N/A;  . Hot hemostasis N/A 08/14/2012    Procedure: HOT HEMOSTASIS (ARGON PLASMA COAGULATION/BICAP);  Surgeon: [name], MD;  Location: [facility] ENDOSCOPY;  Service: Endoscopy;  Laterality: N/A;  . Esophagogastroduodenoscopy (egd) with propofol N/A 08/04/2013    Procedure: ESOPHAGOGASTRODUODENOSCOPY (EGD) WITH PROPOFOL;  Surgeon: [name], MD;  Location: [facility] ENDOSCOPY;  Service: Endoscopy;  Laterality: N/A;  . Esophagogastroduodenoscopy (egd) with propofol  N/A 06/08/2014    Procedure: ESOPHAGOGASTRODUODENOSCOPY (EGD) WITH PROPOFOL;  Surgeon: Milus Banister, MD;  Location: [place];  Service: Endoscopy;  Laterality: N/A;  . Peripheral vascular catheterization N/A 09/07/2014    Procedure: Abdominal Aortogram;  Surgeon: Angelia Mould, MD;  Location: [place] CV LAB;  Service: Cardiovascular;  Laterality: N/A;  . Coronary artery bypass graft  12/2007    "CABG X3"  . Coronary angioplasty with stent placement  08/2007    Prior to Admission medications   Medication Sig Start Date End Date Taking? Authorizing Provider  albuterol (PROVENTIL HFA;VENTOLIN HFA) 108 (90 BASE) MCG/ACT inhaler Inhale 2 puffs into the lungs every 6 (six) hours as needed for wheezing or shortness of breath. 04/30/14  Yes Thurnell Lose, MD  amLODipine (NORVASC) 10 MG tablet Take 1 tablet (10 mg total) by mouth daily. 09/18/14  Yes Shanker Kristeen Mans, MD  aspirin EC 81 MG tablet Take 1 tablet (81 mg total) by mouth daily. 09/18/14  Yes Shanker Kristeen Mans, MD  budesonide-formoterol (SYMBICORT) 80-4.5 MCG/ACT inhaler Inhale 2 puffs into the lungs 2 (two) times daily. 09/08/14  Yes Ripudeep Krystal Eaton, MD  citalopram (CELEXA) 20 MG tablet Take 20 mg by mouth daily.   Yes Historical Provider, MD  cloNIDine (CATAPRES) 0.3 MG tablet Take 1 tablet (0.3 mg total) by mouth 2 (two) times daily. 09/14/14  Yes Jolaine Artist, MD  diclofenac sodium  (VOLTAREN) 1 % GEL Apply 4 g topically 4 (four) times daily. 09/22/14  Yes April Palumbo, MD  divalproex (DEPAKOTE ER) 500 MG 24 hr tablet Take 500 mg by mouth 2 (two) times daily. 05/29/14  Yes Historical Provider, MD  ferrous sulfate 325 (65 FE) MG tablet Take 325 mg by mouth 2 (two) times daily with a meal.    Yes Historical Provider, MD  furosemide (LASIX) 80 MG tablet Take 80 mg by mouth daily. 10/12/14  Yes Historical Provider, MD  hydrALAZINE (APRESOLINE) 100 MG tablet Take 100 mg by mouth 3 (three) times daily. 05/29/14  Yes Historical Provider, MD  isosorbide mononitrate (IMDUR) 30 MG 24 hr tablet Take 2 tablets (60 mg total) by mouth daily. 07/26/14  Yes Modena Jansky, MD  lisinopril (PRINIVIL,ZESTRIL) 20 MG tablet Take 20 mg by mouth daily. 10/01/14  Yes Historical Provider, MD  metolazone (ZAROXOLYN) 5 MG tablet Take 5 mg by mouth daily. 10/12/14  Yes Historical Provider, MD  pantoprazole (PROTONIX) 40 MG tablet Take 40 mg by mouth daily.    Yes Historical Provider, MD  phenytoin (DILANTIN) 100 MG ER capsule Take 200 mg by mouth at bedtime. 10/01/14  Yes Historical Provider, MD  phenytoin (DILANTIN) 200 MG ER capsule Take 200 mg by mouth daily. 10/01/14  Yes Historical Provider, MD  potassium chloride SA (KLOR-CON M20) 20 MEQ tablet Take 20 mEq by mouth daily. 01/17/10  Yes Historical Provider, MD  torsemide (DEMADEX) 20 MG tablet Take 2 tablets (40 mg total) by mouth 2 (two) times daily. 09/18/14  Yes Shanker Kristeen Mans, MD  atorvastatin (LIPITOR) 80 MG tablet Take 1 tablet (80 mg total) by mouth daily at 6 PM. 10/07/14   Charlynne Cousins, MD  HYDROcodone-acetaminophen (NORCO/VICODIN) 5-325 MG per tablet Take 2 tablets by mouth every 6 (six) hours as needed for moderate pain. Patient not taking: Reported on 10/15/2014 09/08/14   Ripudeep Krystal Eaton, MD  nitroGLYCERIN (NITROSTAT) 0.4 MG SL tablet Place 1 tablet (0.4 mg total) under the tongue every 5 (five) minutes as needed  for chest pain (upto max 3  doses at one time.). Patient not taking: Reported on 10/15/2014 07/26/14   Modena Jansky, MD    Scheduled Meds: . amLODipine  10 mg Oral Daily  . atorvastatin  80 mg Oral q1800  . budesonide-formoterol  2 puff Inhalation BID  . citalopram  20 mg Oral Daily  . cloNIDine  0.3 mg Oral BID  . divalproex  500 mg Oral BID  . ferrous sulfate  325 mg Oral BID WC  . furosemide  40 mg Intravenous Q12H  . hydrALAZINE  100 mg Oral TID  . lisinopril  20 mg Oral Daily  . metolazone  5 mg Oral Daily  . pantoprazole (PROTONIX) IV  40 mg Intravenous Q12H  . phenytoin  200 mg Oral BID  . potassium chloride SA  20 mEq Oral Daily  . sodium chloride  3 mL Intravenous Q12H   Infusions: . nitroGLYCERIN 5 mcg/min (10/16/14 0648)   PRN Meds: albuterol, morphine injection, ondansetron **OR** ondansetron (ZOFRAN) IV   Allergies as of 10/15/2014 - Review Complete 10/15/2014  Allergen Reaction Noted  . Motrin [ibuprofen] Other (See Comments) 05/16/2010  . Tylenol [acetaminophen] Other (See Comments) 05/16/2010    Family History  Problem Relation Age of Onset  . Heart disease Mother     unknown type  . Heart disease Father 32    died of MI at 78yo  . Heart disease Paternal Grandfather 41    died of MI  . Heart disease    . Heart disease Brother 30    History   Social History  . Marital Status: Widowed    Spouse Name: N/A  . Number of Children: 2  . Years of Education: N/A   Occupational History  . Unemployed     previously worked in [employer]  .     Social History Main Topics  . Smoking status: Former Smoker -- 0.25 packs/day for 10 years    Types: Cigarettes  . Smokeless tobacco: Never Used     Comment: "quit smoking cigarettes in 2009; smoked off and on since I was 13"  . Alcohol Use: 0.0 oz/week    0 Standard drinks or equivalent per week     Comment: 09/17/2014 "maybe 4 beers/month, 12 oz cans"  . Drug Use: 1.00 per week    Special: Cocaine, Marijuana     Comment:  09/17/2014 "it's been 2 wks since I last used cocaine"  . Sexual Activity: Yes     Comment: pt states "I smoked a little small piece on 02/25/14   Other Topics Concern  . Not on file   Social History Narrative   Lives in [place]. Originally from [place], has been in the [place] since 1980s      **11/2013 - says that he lives with dtr in [facility].**                      REVIEW OF SYSTEMS: Constitutional:  Tired, weak. Shortness of breath improved ENT:  No nose bleeds Pulm:  Improved shortness of breath CV:  No palpitations, no LE edema. Chest pain resolved GU:  No hematuria, no frequency GI:  Per HPI Heme:  Per HPI   Transfusions:  Per HPI.  Neuro:  No headaches, no peripheral tingling or numbness Derm:  No itching, no rash or sores.  Endocrine:  No sweats or chills.  No polyuria or dysuria Immunization:  Not queried. Travel:  None beyond  local counties in last few months.    PHYSICAL EXAM: Vital signs in last 24 hours: Filed Vitals:   10/16/14 0605  BP: 143/81  Pulse: 79  Temp:   Resp: 14   Wt Readings from Last 3 Encounters:  10/15/14 162 lb 7.7 oz (73.7 kg)  10/07/14 158 lb 3.2 oz (71.759 kg)  09/18/14 167 lb 14.4 oz (76.159 kg)    General: Comfortable, slightly agitated, obese, non-critically ill appearing Head:  No facial edema or asymmetry  Eyes:  No scleral icterus, no conjunctival pallor. Ears:  Slightly HOH  Nose:  No discharge Mouth:  Clear, moist. Poor dentition. Neck:  No JVD, no TMG. Lungs:  Clear bilaterally though overall reduced breath sounds Heart: RRR. No MRG. S1/S2 audible. Abdomen:  Soft, bowel sounds hypoactive. Nt, ND.  No HSM, hernias or masses.   Rectal: No stool, no lesions. No blood.   Musc/Skeltl: No joint swelling, redness or gross deformity. Extremities:  No CCE.  Neurologic:  Oriented 3. No tremor. No limb weakness. Skin:  Multiple annular scars on the back arms and legs some of these in a state of healing,  some of these scabbed Nodes:  No inguinal or cervical adenopathy.   Psych:  Cooperative, slightly agitated.  Intake/Output from previous day: 07/28 0701 - 07/29 0700 In: 335 [Blood:335] Out: 450 [Urine:450] Intake/Output this shift:    LAB RESULTS:  Recent Labs  10/15/14 1536 10/16/14 0425  WBC 5.1 4.4  HGB 6.8* 8.0*  HCT 22.2* 25.5*  PLT 326 278   BMET Lab Results  Component Value Date   NA 135 10/16/2014   NA 137 10/15/2014   NA 137 10/06/2014   K 3.8 10/16/2014   K 4.3 10/15/2014   K 4.0 10/06/2014   CL 104 10/16/2014   CL 106 10/15/2014   CL 105 10/06/2014   CO2 23 10/16/2014   CO2 23 10/15/2014   CO2 24 10/06/2014   GLUCOSE 89 10/16/2014   GLUCOSE 80 10/15/2014   GLUCOSE 102* 10/06/2014   BUN 15 10/16/2014   BUN 16 10/15/2014   BUN 22* 10/06/2014   CREATININE 1.08 10/16/2014   CREATININE 1.06 10/15/2014   CREATININE 1.05 10/06/2014   CALCIUM 8.2* 10/16/2014   CALCIUM 8.7* 10/15/2014   CALCIUM 8.4* 10/06/2014   LFT  Recent Labs  10/16/14 0425  PROT 6.5  ALBUMIN 2.9*  AST 21  ALT 11*  ALKPHOS 68  BILITOT 0.5   PT/INR Lab Results  Component Value Date   INR 1.14 10/16/2014   INR 1.09 10/05/2014   INR 1.28 08/31/2014   Hepatitis Panel No results for input(s): HEPBSAG, HCVAB, HEPAIGM, HEPBIGM in the last 72 hours. C-Diff No components found for: CDIFF Lipase     Component Value Date/Time   LIPASE 27 10/06/2014 0714    Drugs of Abuse     Component Value Date/Time   LABOPIA NONE DETECTED 10/15/2014 1840   COCAINSCRNUR NONE DETECTED 10/15/2014 1840   LABBENZ NONE DETECTED 10/15/2014 1840   AMPHETMU NONE DETECTED 10/15/2014 1840   THCU NONE DETECTED 10/15/2014 1840   LABBARB NONE DETECTED 10/15/2014 1840     RADIOLOGY STUDIES: Dg Chest 2 View  10/15/2014   CLINICAL DATA:  Chest pain for 2 days.  EXAM: CHEST  2 VIEW  COMPARISON:  Chest x-rays dated 10/05/2014 and 08/10/2014. Comparison is also made to a CT angiogram of the neck  and chest dated 10/06/2014.  FINDINGS: Cardiomegaly is unchanged. Again noted is central pulmonary vascular congestion  and mild bilateral interstitial prominence most suggestive of interstitial edema. No confluent airspace opacity to suggest a developing pneumonia. No pleural effusion seen. No pneumothorax. Median sternotomy wires appear intact and stable in position. No acute osseous abnormality identified.  IMPRESSION: Cardiomegaly with central pulmonary vascular congestion and mild bilateral interstitial edema suggesting mild volume overload/congestive heart failure. This may be a chronic volume overload/congestive heart failure as the appearance is stable compared to multiple prior exams.   Electronically Signed   By: Franki Cabot M.D.   On: 10/15/2014 15:21   Ct Angio Chest Aorta W/cm &/or Wo/cm  10/15/2014   CLINICAL DATA:  Chest pain.  Shortness of breath.  Low hemoglobin.  EXAM: CT ANGIOGRAPHY CHEST, ABDOMEN AND PELVIS  TECHNIQUE: Multidetector CT imaging through the chest, abdomen and pelvis was performed using the standard protocol during bolus administration of intravenous contrast. Multiplanar reconstructed images and MIPs were obtained and reviewed to evaluate the vascular anatomy.  CONTRAST:  167mL OMNIPAQUE IOHEXOL 350 MG/ML SOLN  COMPARISON:  None.  Chest x-ray dated 10/15/2014 and CT angiogram of the chest abdomen pelvis dated 10/06/2014  FINDINGS: CTA CHEST FINDINGS  There are no pulmonary emboli and there is no aortic dissection. There are scattered calcifications in the thoracic aorta. Extensive coronary artery calcification. Prior CABG. Cardiomegaly, unchanged since the prior exam. The patient has chronic interstitial lung disease with slight interstitial edema at the lung bases, improved since the prior CT scan. Tiny left pleural effusion.  Review of the MIP images confirms the above findings.  CTA ABDOMEN AND PELVIS FINDINGS  Extensive atherosclerosis of the arteries of the abdomen and  pelvis. Severe bilateral proximal common iliac artery stenoses. No dissection or aneurysm. Single widely patent renal arteries bilaterally.  Liver, biliary tree, spleen, pancreas, adrenal glands and kidneys are normal except for an exophytic 12 mm hyperdense cyst on the lateral aspect of the mid left kidney, unchanged in size in densities since 03/08/2013.  The bowel is normal. Bladder is normal. No acute osseous abnormalities.  Review of the MIP images confirms the above findings.  IMPRESSION: 1. No acute abnormalities of the chest or abdomen. Specifically, no evidence of aortic dissection or pulmonary emboli. 2. Improved slight interstitial pulmonary edema at the lung bases superimposed on chronic interstitial lung disease. 3. Extensive aortic atherosclerosis with severe stenoses of the common iliac arteries bilaterally.   Electronically Signed   By: Lorriane Shire M.D.   On: 10/15/2014 17:29   Ct Cta Abd/pel W/cm &/or W/o Cm  10/15/2014   CLINICAL DATA:  Chest pain.  Shortness of breath.  Low hemoglobin.  EXAM: CT ANGIOGRAPHY CHEST, ABDOMEN AND PELVIS  TECHNIQUE: Multidetector CT imaging through the chest, abdomen and pelvis was performed using the standard protocol during bolus administration of intravenous contrast. Multiplanar reconstructed images and MIPs were obtained and reviewed to evaluate the vascular anatomy.  CONTRAST:  159mL OMNIPAQUE IOHEXOL 350 MG/ML SOLN  COMPARISON:  None.  Chest x-ray dated 10/15/2014 and CT angiogram of the chest abdomen pelvis dated 10/06/2014  FINDINGS: CTA CHEST FINDINGS  There are no pulmonary emboli and there is no aortic dissection. There are scattered calcifications in the thoracic aorta. Extensive coronary artery calcification. Prior CABG. Cardiomegaly, unchanged since the prior exam. The patient has chronic interstitial lung disease with slight interstitial edema at the lung bases, improved since the prior CT scan. Tiny left pleural effusion.  Review of the MIP  images confirms the above findings.  CTA ABDOMEN AND PELVIS FINDINGS  Extensive  atherosclerosis of the arteries of the abdomen and pelvis. Severe bilateral proximal common iliac artery stenoses. No dissection or aneurysm. Single widely patent renal arteries bilaterally.  Liver, biliary tree, spleen, pancreas, adrenal glands and kidneys are normal except for an exophytic 12 mm hyperdense cyst on the lateral aspect of the mid left kidney, unchanged in size in densities since 03/08/2013.  The bowel is normal. Bladder is normal. No acute osseous abnormalities.  Review of the MIP images confirms the above findings.  IMPRESSION: 1. No acute abnormalities of the chest or abdomen. Specifically, no evidence of aortic dissection or pulmonary emboli. 2. Improved slight interstitial pulmonary edema at the lung bases superimposed on chronic interstitial lung disease. 3. Extensive aortic atherosclerosis with severe stenoses of the common iliac arteries bilaterally.   Electronically Signed   By: Lorriane Shire M.D.   On: 10/15/2014 17:29    ENDOSCOPIC STUDIES: Per hpi  IMPRESSION:   *  Recurrent anemia, symptomatic, in setting of ongoing GI blood losses.  Stool dark and possibly with fresh blood.  S/p numerous enteroscopies, EGD and colonoscopy: APC ablation of gastric AVMs.  Internal hemorrhoids on 2012 colonoscopy.    PLAN:     *  Enteroscopy versus EGD. This is set up for 1 PM today. Patient willing to undergo test however griping vociferously about his not having been allowed to eat.  Azucena Freed  10/16/2014, 10:47 AM Pager: [phone]      Attending physician's note   I have taken a history, examined the patient and reviewed the chart. I agree with the Advanced Practitioner's note, impression and recommendations. Recurrent Fe deficiency anemia and heme + stool. Suspect bleeding is from known AVMs. Transfuse to Hb >8. Recommend IV Fe. Recommend hematology consult as he likely will continue to have GI  bleeding that will need support to compensate for chronic GI losses. Enteroscopy today.   [name], MD Marval Regal [phone] pager Mon-Fri 8a-5p [phone] weekends, holidays and 5p-8a or per [facility]

## 2014-10-16 NOTE — Progress Notes (Signed)
CSW wrote letter explaining pt current medical status and why he is unable to be in court today.  CSW read note to pt and got approval for its content to be sent.  CSW faxed letter at 9:50am- provided pt with copy of fax  CSW signing off- please reconsult if further needs arise  Domenica Reamer, [place] Social Worker [phone]

## 2014-10-16 NOTE — Progress Notes (Signed)
Pt making inappropriate comments to staff throughout day. When addressed with patient, he stated that he was just playing.  Stated he didn't mean anything by it. Payton Emerald, RN

## 2014-10-16 NOTE — Transfer of Care (Signed)
Immediate Anesthesia Transfer of Care Note  Patient: Charles [name]  Procedure(s) Performed: Procedure(s): ENTEROSCOPY (N/A)  Patient Location: PACU and Endoscopy Unit  Anesthesia Type:MAC  Level of Consciousness: awake, alert , oriented and sedated  Airway & Oxygen Therapy: Patient Spontanous Breathing and Patient connected to nasal cannula oxygen  Post-op Assessment: Report given to RN, Post -op Vital signs reviewed and stable and Patient moving all extremities  Post vital signs: Reviewed and stable  Last Vitals:  Filed Vitals:   10/16/14 1218  BP: 169/100  Pulse: 93  Temp: 36.6 C  Resp: 18    Complications: No apparent anesthesia complications

## 2014-10-16 NOTE — Consult Note (Addendum)
CARDIOLOGY CONSULT NOTE   Patient ID: Charles [name] MRN: [id number], DOB/AGE: 1945-12-28   Admit date: 10/15/2014 Date of Consult: 10/16/2014   Primary Physician: No PCP Per Patient Primary Cardiologist: Dr. Marylou Mccoy in [place]. Profile  69 year old gentleman with known coronary artery disease and past history of recurrent GI bleeds admitted with chest pain and hemoglobin of 6.  He has had recent hematochezia.  Problem List  Past Medical History  Diagnosis Date  . Iron deficiency anemia   . Chronic combined systolic and diastolic CHF, NYHA class 2     a. 11/2013 Echo: EF 40-45%, basl-mid inflat AK, Gr 2 DD.  [name] Hyperlipidemia LDL goal <70   . Ischemic cardiomyopathy     a. 11/2013 Echo: EF 40-45%, basl-mid inflat AK, Gr 2 DD, mild AI, mod dil LA/RA, mod reduced RV fxn, PASP 74mmHg.  . Moderate to severe pulmonary hypertension 03/2010    a. PA peak pressure of 76 mmHg (per 2D Echo 03/2010)  . Polysubstance abuse     a. cocaine, THC  . AV malformation of gastrointestinal tract     a. w/ h/o GIB.  [name] Family history of early CAD   . Peripheral vascular disease   . Seizure disorder   . Hypertension   . CAD (coronary artery disease)     a. s/p 3-vessel CABG (12/2007) // 100% RCA stenosis with collaterals from left system. Severe bifurcation lesions of proxima CXA and OM. Moderate LAD disease - followed by Dr. Wyline Copas in [facility];  b. 11/2013 Myoview: Large fixed inf defect w/o ischemia, EF 35%.  . Chronic kidney disease (CKD), stage III (moderate)     a. BL SCr 1.5-1.6  . Renal artery stenosis   . Cancer     "I have cancer; right now it's not known what kind" (09/17/2014)  . Myocardial infarction 08/2007; 12/2007  . Anginal pain   . COPD (chronic obstructive pulmonary disease)   . Pneumonia     "I've had pneumonia once" (09/17/2014)  . Obstructive sleep apnea     "never told me to wear mask" (09/17/2014)  . History of blood transfusion "I've had quite a few"    "they  don't know where it's coming from" (09/17/2014)  . History of hiatal hernia   . Seizures     "I've had a few; don't know what kind they call it" (09/17/2014)  . DDD (degenerative disc disease), lumbar   . Arthritis     "ankles; left hip; wrists" (09/17/2014)  . Chronic lower back pain   . Depression   . Bipolar disorder   . Schizophrenia     Past Surgical History  Procedure Laterality Date  . Apc  03/2010    To treat small bowel AVMs  . Esophagogastroduodenoscopy N/A 08/14/2012    Procedure: ESOPHAGOGASTRODUODENOSCOPY (EGD);  Surgeon: [name], MD;  Location: [facility] ENDOSCOPY;  Service: Endoscopy;  Laterality: N/A;  . Hot hemostasis N/A 08/14/2012    Procedure: HOT HEMOSTASIS (ARGON PLASMA COAGULATION/BICAP);  Surgeon: [name], MD;  Location[facility] ENDOSCOPY;  Service: Endoscopy;  Laterality: N/A;  . Esophagogastroduodenoscopy (egd) with propofol N/A 08/04/2013    Procedure: ESOPHAGOGASTRODUODENOSCOPY (EGD) WITH PROPOFOL;  Surgeon: [name], MD;  Location: [facility] ENDOSCOPY;  Service: Endoscopy;  Laterality: N/A;  . Esophagogastroduodenoscopy (egd) with propofol N/A 06/08/2014    Procedure: ESOPHAGOGASTRODUODENOSCOPY (EGD) WITH PROPOFOL;  Surgeon: Milus Banister, MD;  Location: [place];  Service: Endoscopy;  Laterality: N/A;  . Peripheral vascular  catheterization N/A 09/07/2014    Procedure: Abdominal Aortogram;  Surgeon: Angelia Mould, MD;  Location: [place] CV LAB;  Service: Cardiovascular;  Laterality: N/A;  . Coronary artery bypass graft  12/2007    "CABG X3"  . Coronary angioplasty with stent placement  08/2007     Allergies  Allergies  Allergen Reactions  . Motrin [Ibuprofen] Other (See Comments)    Affects kidneys  . Tylenol [Acetaminophen] Other (See Comments)    Affects kidneys    HPI   This 69 year old gentleman has a past history of coronary artery disease.  He had CABG at [facility] in 2009.  He has a history of mixed systolic and diastolic  heart failure with his most recent echocardiogram in March 2016 showing an ejection fraction of 45-50% with grade 2 diastolic dysfunction.  He had a Myoview in September 2015 which was negative for ischemia.  He was admitted in March 2016 with GI bleed and was found to have gastric AV malformations by esophagogastroduodenoscopy.  The patient has a past history of cocaine abuse.  He last used cocaine about one week ago.  He came to the emergency room yesterday because of a 2 day history of bloody bowel movements.  His hemoglobin was 6.8.  He was transfused one unit of packed cells overnight.  Hemoglobin this morning is 8.0.  He is in no respiratory distress.  He was hypertensive on admission and was treated with IV nitroglycerin.  Beta blockers were avoided because of the history of cocaine abuse. Chest x-ray shows cardiomegaly and chronic pulmonary venous hypertension which appears to be stable.  His brain natruretic peptide is elevated.  His troponins are mildly elevated.  His electrocardiogram shows no acute changes.  This morning he is not expressing any chest discomfort or evidence of active GI bleeding.  He complains of pain in his legs.  Inpatient Medications  . amLODipine  10 mg Oral Daily  . atorvastatin  80 mg Oral q1800  . budesonide-formoterol  2 puff Inhalation BID  . citalopram  20 mg Oral Daily  . cloNIDine  0.3 mg Oral BID  . divalproex  500 mg Oral BID  . ferrous sulfate  325 mg Oral BID WC  . furosemide  40 mg Intravenous Q12H  . hydrALAZINE  100 mg Oral TID  . lisinopril  20 mg Oral Daily  . metolazone  5 mg Oral Daily  . pantoprazole  40 mg Oral Daily  . [START ON 10/19/2014] pantoprazole (PROTONIX) IV  40 mg Intravenous Q12H  . phenytoin  200 mg Oral BID  . potassium chloride SA  20 mEq Oral Daily  . sodium chloride  3 mL Intravenous Q12H    Family History Family History  Problem Relation Age of Onset  . Heart disease Mother     unknown type  . Heart disease Father 14     died of MI at 85yo  . Heart disease Paternal Grandfather 94    died of MI  . Heart disease    . Heart disease Brother 24     Social History History   Social History  . Marital Status: Widowed    Spouse Name: N/A  . Number of Children: 2  . Years of Education: N/A   Occupational History  . Unemployed     previously worked in [employer]  .     Social History Main Topics  . Smoking status: Former Smoker -- 0.25 packs/day for 10 years  Types: Cigarettes  . Smokeless tobacco: Never Used     Comment: "quit smoking cigarettes in 2009; smoked off and on since I was 13"  . Alcohol Use: 0.0 oz/week    0 Standard drinks or equivalent per week     Comment: 09/17/2014 "maybe 4 beers/month, 12 oz cans"  . Drug Use: 1.00 per week    Special: Cocaine, Marijuana     Comment: 09/17/2014 "it's been 2 wks since I last used cocaine"  . Sexual Activity: Yes     Comment: pt states "I smoked a little small piece on 02/25/14   Other Topics Concern  . Not on file   Social History Narrative   Lives in [place]. Originally from [place], has been in the [place] since 1980s      **11/2013 - says that he lives with dtr in [facility]                       Review of Systems  General:  No chills, fever, night sweats or weight changes.  Cardiovascular:  No chest pain, dyspnea on exertion, edema, orthopnea, palpitations, paroxysmal nocturnal dyspnea. Dermatological: No rash, lesions/masses Respiratory: No cough, dyspnea Urologic: No hematuria, dysuria Abdominal:   No nausea, vomiting, diarrhea, bright red blood per rectum, melena, or hematemesis Neurologic:  No visual changes, wkns, changes in mental status. All other systems reviewed and are otherwise negative except as noted above.  Physical Exam  Blood pressure 143/81, pulse 79, temperature 98.3 F (36.8 C), temperature source Oral, resp. rate 14, height 5\' 5"  (1.651 m), weight 162 lb 7.7 oz (73.7 kg), SpO2 100 %.    General: Pleasant, NAD.  Multiple chronic skin lesions on back and extremities. Psych: Normal affect. Neuro: Alert and oriented X 3. Moves all extremities spontaneously. HEENT: Normal  Neck: Supple without bruits or JVD. Lungs:  Resp regular and unlabored, CTA. Heart: RRR no s3, s4, or murmurs. Abdomen: Soft, non-tender, non-distended, BS + x 4.  Extremities: No clubbing, cyanosis or edema. DP/PT/Radials 1+ and equal bilaterally. Home curfew device on right ankle.  Labs   Recent Labs  10/15/14 1536 10/15/14 2212 10/16/14 0425  CKTOTAL 50  --   --   TROPONINI  --  0.07* 0.06*   Lab Results  Component Value Date   WBC 4.4 10/16/2014   HGB 8.0* 10/16/2014   HCT 25.5* 10/16/2014   MCV 87.6 10/16/2014   PLT 278 10/16/2014     Recent Labs Lab 10/16/14 0425  NA 135  K 3.8  CL 104  CO2 23  BUN 15  CREATININE 1.08  CALCIUM 8.2*  PROT 6.5  BILITOT 0.5  ALKPHOS 68  ALT 11*  AST 21  GLUCOSE 89   Lab Results  Component Value Date   CHOL 88 10/15/2013   HDL 32* 10/15/2013   LDLCALC 42 10/15/2013   TRIG 69 10/15/2013   Lab Results  Component Value Date   DDIMER 0.81* 10/23/2013    Radiology/Studies  Dg Chest 2 View  10/15/2014   CLINICAL DATA:  Chest pain for 2 days.  EXAM: CHEST  2 VIEW  COMPARISON:  Chest x-rays dated 10/05/2014 and 08/10/2014. Comparison is also made to a CT angiogram of the neck and chest dated 10/06/2014.  FINDINGS: Cardiomegaly is unchanged. Again noted is central pulmonary vascular congestion and mild bilateral interstitial prominence most suggestive of interstitial edema. No confluent airspace opacity to suggest a developing pneumonia. No pleural effusion seen. No  pneumothorax. Median sternotomy wires appear intact and stable in position. No acute osseous abnormality identified.  IMPRESSION: Cardiomegaly with central pulmonary vascular congestion and mild bilateral interstitial edema suggesting mild volume overload/congestive heart failure.  This may be a chronic volume overload/congestive heart failure as the appearance is stable compared to multiple prior exams.   Electronically Signed   By: Franki Cabot M.D.   On: 10/15/2014 15:21   Ct Angio Chest Pe W/cm &/or Wo Cm  10/06/2014   CLINICAL DATA:  Acute onset of upper back pain and epigastric abdominal pain. Assess for pulmonary embolus. Initial encounter.  EXAM: CT ANGIOGRAPHY CHEST  CT ABDOMEN AND PELVIS WITH CONTRAST  TECHNIQUE: Multidetector CT imaging of the chest was performed using the standard protocol during bolus administration of intravenous contrast. Multiplanar CT image reconstructions and MIPs were obtained to evaluate the vascular anatomy. Multidetector CT imaging of the abdomen and pelvis was performed using the standard protocol during bolus administration of intravenous contrast.  CONTRAST:  150mL OMNIPAQUE IOHEXOL 350 MG/ML SOLN  COMPARISON:  Chest radiograph performed 10/05/2014, CTA of the chest performed 04/26/2014, and CT of the abdomen and pelvis from 07/22/2014  FINDINGS: CTA CHEST FINDINGS  There is no evidence of pulmonary embolus.  Mild bilateral interstitial prominence may reflect mild interstitial lung disease or possibly minimal interstitial edema. There is no evidence of pleural effusion or pneumothorax. No masses are identified; no abnormal focal contrast enhancement is seen.  There is dilatation of the ascending thoracic aorta to 4.6 cm in maximal AP dimension. Diffuse coronary artery calcifications are seen. Cardiomegaly is noted. Visualized mediastinal nodes remain normal in size. The patient is status post median sternotomy. No pericardial effusion is identified. The great vessels are grossly unremarkable in appearance. No axillary lymphadenopathy is seen. The visualized portions of the thyroid gland are unremarkable in appearance.  No acute osseous abnormalities are seen.  CT ABDOMEN and PELVIS FINDINGS  The liver and spleen are unremarkable in appearance. The  gallbladder is within normal limits. The pancreas and adrenal glands are unremarkable.  A 1.3 cm cyst is noted at the interpole region of the left kidney. Nonspecific perinephric stranding is noted bilaterally. Calcifications are seen in both renal hila, vascular in nature. There is no evidence of hydronephrosis. No renal or ureteral stones are seen. No perinephric stranding is appreciated.  No free fluid is identified. The small bowel is unremarkable in appearance. The stomach is within normal limits. No acute vascular abnormalities are seen.  Diffuse calcification is noted along the abdominal aorta and its branches, with likely severe luminal narrowing noted at the proximal common iliac arteries bilaterally.  The appendix is normal in caliber and contains air, without evidence of appendicitis. The colon is unremarkable in appearance.  The bladder is mildly distended and grossly unremarkable. The prostate remains normal in size. No inguinal lymphadenopathy is seen.  No acute osseous abnormalities are identified. Vacuum phenomenon is noted along the lower lumbar spine.  Review of the MIP images confirms the above findings.  IMPRESSION: 1. No evidence of pulmonary embolus. 2. Mild bilateral interstitial prominence may reflect mild interstitial lung disease or possibly minimal interstitial edema. 3. 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 Recommend semi-annual imaging followup by CTA or MRA and referral to cardiothoracic surgery if not already obtained. This recommendation follows 2010 ACCF/AHA/AATS/ACR/ASA/SCA/SCAI/SIR/STS/SVM Guidelines for the Diagnosis and Management of Patients With Thoracic Aortic Disease. Circulation. 2010; 121: Y101-B510 4. Small left renal cyst noted. 5.  Diffuse calcification along the abdominal aorta and its branches, with likely severe luminal narrowing at the proximal common iliac arteries bilaterally.   Electronically Signed   By: Garald Balding M.D.    On: 10/06/2014 02:55   Ct Abdomen Pelvis W Contrast  10/06/2014   CLINICAL DATA:  Acute onset of upper back pain and epigastric abdominal pain. Assess for pulmonary embolus. Initial encounter.  EXAM: CT ANGIOGRAPHY CHEST  CT ABDOMEN AND PELVIS WITH CONTRAST  TECHNIQUE: Multidetector CT imaging of the chest was performed using the standard protocol during bolus administration of intravenous contrast. Multiplanar CT image reconstructions and MIPs were obtained to evaluate the vascular anatomy. Multidetector CT imaging of the abdomen and pelvis was performed using the standard protocol during bolus administration of intravenous contrast.  CONTRAST:  15mL OMNIPAQUE IOHEXOL 350 MG/ML SOLN  COMPARISON:  Chest radiograph performed 10/05/2014, CTA of the chest performed 04/26/2014, and CT of the abdomen and pelvis from 07/22/2014  FINDINGS: CTA CHEST FINDINGS  There is no evidence of pulmonary embolus.  Mild bilateral interstitial prominence may reflect mild interstitial lung disease or possibly minimal interstitial edema. There is no evidence of pleural effusion or pneumothorax. No masses are identified; no abnormal focal contrast enhancement is seen.  There is dilatation of the ascending thoracic aorta to 4.6 cm in maximal AP dimension. Diffuse coronary artery calcifications are seen. Cardiomegaly is noted. Visualized mediastinal nodes remain normal in size. The patient is status post median sternotomy. No pericardial effusion is identified. The great vessels are grossly unremarkable in appearance. No axillary lymphadenopathy is seen. The visualized portions of the thyroid gland are unremarkable in appearance.  No acute osseous abnormalities are seen.  CT ABDOMEN and PELVIS FINDINGS  The liver and spleen are unremarkable in appearance. The gallbladder is within normal limits. The pancreas and adrenal glands are unremarkable.  A 1.3 cm cyst is noted at the interpole region of the left kidney. Nonspecific perinephric  stranding is noted bilaterally. Calcifications are seen in both renal hila, vascular in nature. There is no evidence of hydronephrosis. No renal or ureteral stones are seen. No perinephric stranding is appreciated.  No free fluid is identified. The small bowel is unremarkable in appearance. The stomach is within normal limits. No acute vascular abnormalities are seen.  Diffuse calcification is noted along the abdominal aorta and its branches, with likely severe luminal narrowing noted at the proximal common iliac arteries bilaterally.  The appendix is normal in caliber and contains air, without evidence of appendicitis. The colon is unremarkable in appearance.  The bladder is mildly distended and grossly unremarkable. The prostate remains normal in size. No inguinal lymphadenopathy is seen.  No acute osseous abnormalities are identified. Vacuum phenomenon is noted along the lower lumbar spine.  Review of the MIP images confirms the above findings.  IMPRESSION: 1. No evidence of pulmonary embolus. 2. Mild bilateral interstitial prominence may reflect mild interstitial lung disease or possibly minimal interstitial edema. 3. 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 Recommend semi-annual imaging followup by CTA or MRA and referral to cardiothoracic surgery if not already obtained. This recommendation follows 2010 ACCF/AHA/AATS/ACR/ASA/SCA/SCAI/SIR/STS/SVM Guidelines for the Diagnosis and Management of Patients With Thoracic Aortic Disease. Circulation. 2010; 121: I458-K998 4. Small left renal cyst noted. 5. Diffuse calcification along the abdominal aorta and its branches, with likely severe luminal narrowing at the proximal common iliac arteries bilaterally.   Electronically Signed   By: Garald Balding M.D.   On:  10/06/2014 02:55   US Abdomen Limited  09/16/2014   CLINICAL DATA:  Abdominal pain.  History of cirrhosis.  EXAM: LIMITED ABDOMEN ULTRASOUND FOR ASCITES  TECHNIQUE:  Limited ultrasound survey for ascites was performed in all four abdominal quadrants.  COMPARISON:  CT 07/22/2014.  Ultrasound 08/01/2014.  FINDINGS: No significant ascites identified on sonographic survey of 4 abdominal quadrants. Echogenic liver noted.  IMPRESSION: No evidence of recurrent ascites.   Electronically Signed   By: Richardean Sale M.D.   On: 09/16/2014 20:33   Dg Chest Port 1 View  10/05/2014   CLINICAL DATA:  69 year old male with chest pain  EXAM: PORTABLE CHEST - 1 VIEW  COMPARISON:  Radiograph dated 10/04/2014  FINDINGS: There is stable cardiomegaly with prominence of the central vasculature. An area of increased opacity at the left lower lung field may be related to an enlarged cardiac silhouette. Underlying pneumonia is not excluded. Clinical correlation is recommended. No significant pleural effusion or pneumothorax. Median sternotomy wires.  IMPRESSION: Stable cardiomegaly with congestive changes.  Left lower lung field opacity may be related to atelectasis or pneumonia. Clinical correlation is recommended.   Electronically Signed   By: Anner Crete M.D.   On: 10/05/2014 22:51   Ct Angio Chest Aorta W/cm &/or Wo/cm  10/15/2014   CLINICAL DATA:  Chest pain.  Shortness of breath.  Low hemoglobin.  EXAM: CT ANGIOGRAPHY CHEST, ABDOMEN AND PELVIS  TECHNIQUE: Multidetector CT imaging through the chest, abdomen and pelvis was performed using the standard protocol during bolus administration of intravenous contrast. Multiplanar reconstructed images and MIPs were obtained and reviewed to evaluate the vascular anatomy.  CONTRAST:  126mL OMNIPAQUE IOHEXOL 350 MG/ML SOLN  COMPARISON:  None.  Chest x-ray dated 10/15/2014 and CT angiogram of the chest abdomen pelvis dated 10/06/2014  FINDINGS: CTA CHEST FINDINGS  There are no pulmonary emboli and there is no aortic dissection. There are scattered calcifications in the thoracic aorta. Extensive coronary artery calcification. Prior CABG.  Cardiomegaly, unchanged since the prior exam. The patient has chronic interstitial lung disease with slight interstitial edema at the lung bases, improved since the prior CT scan. Tiny left pleural effusion.  Review of the MIP images confirms the above findings.  CTA ABDOMEN AND PELVIS FINDINGS  Extensive atherosclerosis of the arteries of the abdomen and pelvis. Severe bilateral proximal common iliac artery stenoses. No dissection or aneurysm. Single widely patent renal arteries bilaterally.  Liver, biliary tree, spleen, pancreas, adrenal glands and kidneys are normal except for an exophytic 12 mm hyperdense cyst on the lateral aspect of the mid left kidney, unchanged in size in densities since 03/08/2013.  The bowel is normal. Bladder is normal. No acute osseous abnormalities.  Review of the MIP images confirms the above findings.  IMPRESSION: 1. No acute abnormalities of the chest or abdomen. Specifically, no evidence of aortic dissection or pulmonary emboli. 2. Improved slight interstitial pulmonary edema at the lung bases superimposed on chronic interstitial lung disease. 3. Extensive aortic atherosclerosis with severe stenoses of the common iliac arteries bilaterally.   Electronically Signed   By: Lorriane Shire M.D.   On: 10/15/2014 17:29   Ct Cta Abd/pel W/cm &/or W/o Cm  10/15/2014   CLINICAL DATA:  Chest pain.  Shortness of breath.  Low hemoglobin.  EXAM: CT ANGIOGRAPHY CHEST, ABDOMEN AND PELVIS  TECHNIQUE: Multidetector CT imaging through the chest, abdomen and pelvis was performed using the standard protocol during bolus administration of intravenous contrast. Multiplanar reconstructed images and MIPs were  obtained and reviewed to evaluate the vascular anatomy.  CONTRAST:  133mL OMNIPAQUE IOHEXOL 350 MG/ML SOLN  COMPARISON:  None.  Chest x-ray dated 10/15/2014 and CT angiogram of the chest abdomen pelvis dated 10/06/2014  FINDINGS: CTA CHEST FINDINGS  There are no pulmonary emboli and there is no  aortic dissection. There are scattered calcifications in the thoracic aorta. Extensive coronary artery calcification. Prior CABG. Cardiomegaly, unchanged since the prior exam. The patient has chronic interstitial lung disease with slight interstitial edema at the lung bases, improved since the prior CT scan. Tiny left pleural effusion.  Review of the MIP images confirms the above findings.  CTA ABDOMEN AND PELVIS FINDINGS  Extensive atherosclerosis of the arteries of the abdomen and pelvis. Severe bilateral proximal common iliac artery stenoses. No dissection or aneurysm. Single widely patent renal arteries bilaterally.  Liver, biliary tree, spleen, pancreas, adrenal glands and kidneys are normal except for an exophytic 12 mm hyperdense cyst on the lateral aspect of the mid left kidney, unchanged in size in densities since 03/08/2013.  The bowel is normal. Bladder is normal. No acute osseous abnormalities.  Review of the MIP images confirms the above findings.  IMPRESSION: 1. No acute abnormalities of the chest or abdomen. Specifically, no evidence of aortic dissection or pulmonary emboli. 2. Improved slight interstitial pulmonary edema at the lung bases superimposed on chronic interstitial lung disease. 3. Extensive aortic atherosclerosis with severe stenoses of the common iliac arteries bilaterally.   Electronically Signed   By: Lorriane Shire M.D.   On: 10/15/2014 17:29    ECG 16-Oct-2014 05:54:46 [facility] ROUTINE RECORD Normal sinus rhythm Right bundle branch block Inferior infarct , age undetermined Abnormal ECG.  Personally reviewed  Echocardiogram on 05/22/14: - Left ventricle: The cavity size was mildly dilated. Wall thickness was increased in a pattern of mild LVH. Systolic function was mildly reduced. The estimated ejection fraction was in the range of 45% to 50%. Diffuse hypokinesis. Features are consistent with a pseudonormal left ventricular filling  pattern, with concomitant abnormal relaxation and increased filling pressure (grade 2 diastolic dysfunction). - Aortic valve: There was trivial regurgitation. - Left atrium: The atrium was moderately to severely dilated. - Right atrium: The atrium was moderately to severely dilated. - Pulmonary arteries: PA peak pressure: 36 mm Hg (S).  ASSESSMENT AND PLAN  1.  GI bleed with hematochezia. 2.  Combined systolic and diastolic chronic CHF, stable 3.  Mild elevation of troponins secondary to supply demand mismatch secondary to anemia. 4.  Past history of cocaine abuse 5.  Past history of medical noncompliance 6.  Essential hypertension, improved 7.  Coronary artery disease with prior CABG in 2009.  Myoview in September 2015 showed no ischemia  Plan: Continue antihypertensive therapy as you are doing.  We can stop the IV nitroglycerin once he is no longer nothing by mouth and at that time switch to Imdur.  No further in-hospital ischemic workup indicated at this time.  Compliance with outpatient medications will be imperative. Further evaluation of recurrent GI bleeding per primary service.  Berna Spare MD  10/16/2014, 9:24 AM

## 2014-10-16 NOTE — Progress Notes (Addendum)
Patient concerned that paperwork gets faxed to Charles [name] office quickly.  Pt says he was supposed to be in court today.  Patient states he wants to be transferred to [facility] for "some procedure that only they can do".  Patient states he needs [place] to get that started because he could not do it. He was told he needed a GI procedure that only could be done there. Pt resting with call bell within reach.  Will continue to monitor. Payton Emerald, RN

## 2014-10-16 NOTE — Progress Notes (Signed)
[facility] PROGRESS NOTE  Charles [name] YQM:[id number] DOB: 1945-07-03 DOA: 10/15/2014  PCP: No PCP Per Patient  Brief HPI: 69 year old African-American male with past medical history of coronary artery disease status post CABG with last Myoview in September 2015, which was negative for ischemia, diastolic CHF, history of GI bleeding with gastric and small bowel AVMs presented with chest pain. He also mentioned 2 episodes of passing bright red blood per rectum. He was hospitalized for further management.  Past medical history:  Past Medical History  Diagnosis Date  . Iron deficiency anemia   . Chronic combined systolic and diastolic CHF, NYHA class 2     a. 11/2013 Echo: EF 40-45%, basl-mid inflat AK, Gr 2 DD.  [name] Hyperlipidemia LDL goal <70   . Ischemic cardiomyopathy     a. 11/2013 Echo: EF 40-45%, basl-mid inflat AK, Gr 2 DD, mild AI, mod dil LA/RA, mod reduced RV fxn, PASP 38mmHg.  . Moderate to severe pulmonary hypertension 03/2010    a. PA peak pressure of 76 mmHg (per 2D Echo 03/2010)  . Polysubstance abuse     a. cocaine, THC  . AV malformation of gastrointestinal tract     a. w/ h/o GIB.  [name] Family history of early CAD   . Peripheral vascular disease   . Seizure disorder   . Hypertension   . CAD (coronary artery disease)     a. s/p 3-vessel CABG (12/2007) // 100% RCA stenosis with collaterals from left system. Severe bifurcation lesions of proxima CXA and OM. Moderate LAD disease - followed by Dr. Wyline Copas in [facility];  b. 11/2013 Myoview: Large fixed inf defect w/o ischemia, EF 35%.  . Chronic kidney disease (CKD), stage III (moderate)     a. BL SCr 1.5-1.6  . Renal artery stenosis   . Cancer     "I have cancer; right now it's not known what kind" (09/17/2014)  . Myocardial infarction 08/2007; 12/2007  . Anginal pain   . COPD (chronic obstructive pulmonary disease)   . Pneumonia     "I've had pneumonia once" (09/17/2014)  . Obstructive sleep apnea     "never  told me to wear mask" (09/17/2014)  . History of blood transfusion "I've had quite a few"    "they don't know where it's coming from" (09/17/2014)  . History of hiatal hernia   . Seizures     "I've had a few; don't know what kind they call it" (09/17/2014)  . DDD (degenerative disc disease), lumbar   . Arthritis     "ankles; left hip; wrists" (09/17/2014)  . Chronic lower back pain   . Depression   . Bipolar disorder   . Schizophrenia     Consultants: Cardiology and gastroenterology  Procedures: endoscopy to be done today  Antibiotics: None  Subjective: Patient states that he continues to have left-sided chest pain. No further episodes of bleeding since yesterday. Also complains of left leg pain.  Objective: Vital Signs  Filed Vitals:   10/15/14 2100 10/16/14 0000 10/16/14 0336 10/16/14 0605  BP: 172/90  114/75 143/81  Pulse: 100  80 79  Temp: 98.5 F (36.9 C)  98.3 F (36.8 C)   TempSrc: Oral  Oral   Resp: 20  21 14   Height:  5\' 5"  (1.651 m)    Weight:      SpO2: 99%  98% 100%    Intake/Output Summary (Last 24 hours) at 10/16/14 1019 Last data filed at 10/16/14 0300  Gross  per 24 hour  Intake    335 ml  Output    450 ml  Net   -115 ml   Filed Weights   10/15/14 2021  Weight: 73.7 kg (162 lb 7.7 oz)    General appearance: alert, cooperative, appears stated age and no distress Resp: clear to auscultation bilaterally Cardio: regular rate and rhythm, S1, S2 normal, no murmur, click, rub or gallop GI: soft, non-tender; bowel sounds normal; no masses,  no organomegaly Extremities: Some limitation in range of motion of the left hip. No acute ischemic signs noted in the left lower extremity Neurologic: No focal deficits  Lab Results:  Basic Metabolic Panel:  Recent Labs Lab 10/15/14 1536 10/16/14 0425  NA 137 135  K 4.3 3.8  CL 106 104  CO2 23 23  GLUCOSE 80 89  BUN 16 15  CREATININE 1.06 1.08  CALCIUM 8.7* 8.2*   Liver Function Tests:  Recent  Labs Lab 10/16/14 0425  AST 21  ALT 11*  ALKPHOS 68  BILITOT 0.5  PROT 6.5  ALBUMIN 2.9*   CBC:  Recent Labs Lab 10/15/14 1536 10/16/14 0425  WBC 5.1 4.4  NEUTROABS  --  3.2  HGB 6.8* 8.0*  HCT 22.2* 25.5*  MCV 86.4 87.6  PLT 326 278   Cardiac Enzymes:  Recent Labs Lab 10/15/14 1536 10/15/14 2212 10/16/14 0425  CKTOTAL 50  --   --   TROPONINI  --  0.07* 0.06*   BNP (last 3 results)  Recent Labs  09/15/14 1430 10/05/14 2201 10/15/14 1632  BNP 3756.9* 748.5* 2506.0*     Studies/Results: Dg Chest 2 View  10/15/2014   CLINICAL DATA:  Chest pain for 2 days.  EXAM: CHEST  2 VIEW  COMPARISON:  Chest x-rays dated 10/05/2014 and 08/10/2014. Comparison is also made to a CT angiogram of the neck and chest dated 10/06/2014.  FINDINGS: Cardiomegaly is unchanged. Again noted is central pulmonary vascular congestion and mild bilateral interstitial prominence most suggestive of interstitial edema. No confluent airspace opacity to suggest a developing pneumonia. No pleural effusion seen. No pneumothorax. Median sternotomy wires appear intact and stable in position. No acute osseous abnormality identified.  IMPRESSION: Cardiomegaly with central pulmonary vascular congestion and mild bilateral interstitial edema suggesting mild volume overload/congestive heart failure. This may be a chronic volume overload/congestive heart failure as the appearance is stable compared to multiple prior exams.   Electronically Signed   By: Franki Cabot M.D.   On: 10/15/2014 15:21   Ct Angio Chest Aorta W/cm &/or Wo/cm  10/15/2014   CLINICAL DATA:  Chest pain.  Shortness of breath.  Low hemoglobin.  EXAM: CT ANGIOGRAPHY CHEST, ABDOMEN AND PELVIS  TECHNIQUE: Multidetector CT imaging through the chest, abdomen and pelvis was performed using the standard protocol during bolus administration of intravenous contrast. Multiplanar reconstructed images and MIPs were obtained and reviewed to evaluate the vascular  anatomy.  CONTRAST:  180mL OMNIPAQUE IOHEXOL 350 MG/ML SOLN  COMPARISON:  None.  Chest x-ray dated 10/15/2014 and CT angiogram of the chest abdomen pelvis dated 10/06/2014  FINDINGS: CTA CHEST FINDINGS  There are no pulmonary emboli and there is no aortic dissection. There are scattered calcifications in the thoracic aorta. Extensive coronary artery calcification. Prior CABG. Cardiomegaly, unchanged since the prior exam. The patient has chronic interstitial lung disease with slight interstitial edema at the lung bases, improved since the prior CT scan. Tiny left pleural effusion.  Review of the MIP images confirms the above findings.  CTA ABDOMEN AND PELVIS FINDINGS  Extensive atherosclerosis of the arteries of the abdomen and pelvis. Severe bilateral proximal common iliac artery stenoses. No dissection or aneurysm. Single widely patent renal arteries bilaterally.  Liver, biliary tree, spleen, pancreas, adrenal glands and kidneys are normal except for an exophytic 12 mm hyperdense cyst on the lateral aspect of the mid left kidney, unchanged in size in densities since 03/08/2013.  The bowel is normal. Bladder is normal. No acute osseous abnormalities.  Review of the MIP images confirms the above findings.  IMPRESSION: 1. No acute abnormalities of the chest or abdomen. Specifically, no evidence of aortic dissection or pulmonary emboli. 2. Improved slight interstitial pulmonary edema at the lung bases superimposed on chronic interstitial lung disease. 3. Extensive aortic atherosclerosis with severe stenoses of the common iliac arteries bilaterally.   Electronically Signed   By: Lorriane Shire M.D.   On: 10/15/2014 17:29   Ct Cta Abd/pel W/cm &/or W/o Cm  10/15/2014   CLINICAL DATA:  Chest pain.  Shortness of breath.  Low hemoglobin.  EXAM: CT ANGIOGRAPHY CHEST, ABDOMEN AND PELVIS  TECHNIQUE: Multidetector CT imaging through the chest, abdomen and pelvis was performed using the standard protocol during bolus  administration of intravenous contrast. Multiplanar reconstructed images and MIPs were obtained and reviewed to evaluate the vascular anatomy.  CONTRAST:  127mL OMNIPAQUE IOHEXOL 350 MG/ML SOLN  COMPARISON:  None.  Chest x-ray dated 10/15/2014 and CT angiogram of the chest abdomen pelvis dated 10/06/2014  FINDINGS: CTA CHEST FINDINGS  There are no pulmonary emboli and there is no aortic dissection. There are scattered calcifications in the thoracic aorta. Extensive coronary artery calcification. Prior CABG. Cardiomegaly, unchanged since the prior exam. The patient has chronic interstitial lung disease with slight interstitial edema at the lung bases, improved since the prior CT scan. Tiny left pleural effusion.  Review of the MIP images confirms the above findings.  CTA ABDOMEN AND PELVIS FINDINGS  Extensive atherosclerosis of the arteries of the abdomen and pelvis. Severe bilateral proximal common iliac artery stenoses. No dissection or aneurysm. Single widely patent renal arteries bilaterally.  Liver, biliary tree, spleen, pancreas, adrenal glands and kidneys are normal except for an exophytic 12 mm hyperdense cyst on the lateral aspect of the mid left kidney, unchanged in size in densities since 03/08/2013.  The bowel is normal. Bladder is normal. No acute osseous abnormalities.  Review of the MIP images confirms the above findings.  IMPRESSION: 1. No acute abnormalities of the chest or abdomen. Specifically, no evidence of aortic dissection or pulmonary emboli. 2. Improved slight interstitial pulmonary edema at the lung bases superimposed on chronic interstitial lung disease. 3. Extensive aortic atherosclerosis with severe stenoses of the common iliac arteries bilaterally.   Electronically Signed   By: Lorriane Shire M.D.   On: 10/15/2014 17:29    Medications:  Scheduled: . amLODipine  10 mg Oral Daily  . atorvastatin  80 mg Oral q1800  . budesonide-formoterol  2 puff Inhalation BID  . citalopram  20 mg  Oral Daily  . cloNIDine  0.3 mg Oral BID  . divalproex  500 mg Oral BID  . ferrous sulfate  325 mg Oral BID WC  . furosemide  40 mg Intravenous Q12H  . hydrALAZINE  100 mg Oral TID  . lisinopril  20 mg Oral Daily  . metolazone  5 mg Oral Daily  . [START ON 10/17/2014] pantoprazole  40 mg Oral QAC breakfast  . phenytoin  200 mg Oral BID  .  potassium chloride SA  20 mEq Oral Daily  . sodium chloride  3 mL Intravenous Q12H   Continuous: . sodium chloride    . lactated ringers    . nitroGLYCERIN 5 mcg/min (10/16/14 1610)   RUE:[id number], morphine injection, ondansetron **OR** ondansetron (ZOFRAN) IV  Assessment/Plan:  Active Problems:   Hypertension   CAD- CABG x3 '09. Myoview no ischemia 11/27/13   Chronic kidney disease (CKD), stage III (moderate)   GI bleed   Chest pain   Chronic diastolic heart failure   Pain in the chest   Bleeding gastrointestinal    Chest pain with history of CAD status post CABG Minimal elevation in troponin, thought to be demand ischemia. Patient seen by cardiology. They do not plan any ischemic workup. Change intravenous nitroglycerin to oral agents. Patient will need to be compliant with his oral medications. Anemia could also have contributed to his pain.  GI bleed, likely secondary to AVM Seen by gastroenterology. Plan is for endoscopy today. No further episodes of rectal bleeding.  Acute blood loss anemia Status post transfusion. Monitor hemoglobin. Since there is an element of chronic GI blood. He may benefit from outpatient follow-up with hematology.  Mild acute on Chronic diastolic CHF Continue IV Lasix for today. Can be transitioned to oral Lasix tomorrow.  Left leg pain Most likely secondary to peripheral vascular disease. There is no evidence for acute ischemia. There was some limitation in the range of motion of his left hip. We will get an x-ray of that joint. He is supposed to follow-up with vascular surgery next month. No need to  repeat arterial Dopplers as he underwent angiogram recently.  History of seizures Continue Depakote and Dilantin  History of polysubstance abuse, specifically cocaine Denies recent use. His urine drug screen was negative for cocaine.  History of essential hypertension Continue current medications.  DVT Prophylaxis: SCDs    Code Status: Full code  Family Communication: Discussed with the patient  Disposition Plan: Await GI workup. Possible discharge tomorrow. Will need outpatient referral to hematology.   LOS: 1 day   [facility] Pager [phone] 10/16/2014, 10:19 AM  If 7PM-7AM, please contact night-coverage at [email or web address], password [email or web address]

## 2014-10-16 NOTE — Progress Notes (Signed)
Patient demanding throughout day. After discussing transfer to [facility] with MD, patient consented to have enteroscopy procedure. Patient complained of diet and Dr. Maryland Pink immediately upgraded diet to regular diet. Wanted ensure and MD ordered.  Wanted more pain medication and Dr. Maryland Pink placed additional one time dose. Patient calling out throughout day. Additional pain medication dose given, patient stated pain 9/10 but was sleeping and had to be aroused.  Patient wanted to know exact time he could have next dose. Pt resting with call bell within reach.  Will continue to monitor. Payton Emerald, RN

## 2014-10-16 NOTE — Op Note (Signed)
[facility] [place], [id number]   ENTEROSCOPY PROCEDURE REPORT     EXAM DATE: 10/16/2014  PATIENT NAME:      Charles [name], Charles [name]           MR #:      [id number]  BIRTHDATE:       24-Dec-1945      VISIT #:     [phone]  ATTENDING:     [name], MD, [facility]     STATUS:     outpatient  ASSISTANT:      Jiles Harold and Carolynn Comment  REFERRING MD: [facility] ASA CLASS:        Class III INDICATIONS:  The patient is a 69 yr old male here for an enteroscopy procedure due to iron deficiency anemia, melenic bleeding, and hemoccult positive stools. PROCEDURE PERFORMED:     Diagnostic small bowel enteroscopy MEDICATIONS:     Monitored anesthesia care and Per Anesthesia CONSENT: The patient understands the risks and benefits of the procedure and understands that these risks include, but are not limited to: sedation, allergic reaction, infection, perforation and/or bleeding. Alternative means of evaluation and treatment include, among others: physical exam, x-rays, and/or surgical intervention. The patient elects to proceed with this endoscopic procedure. DESCRIPTION OF PROCEDURE: During intra-op preparation period all mechanical & medical equipment was checked for proper function. Hand hygiene and appropriate measures for infection prevention was taken. After the risks, benefits and alternatives of the procedure were thoroughly explained, Informed consent was verified, confirmed and timeout was successfully executed by the treatment team. The    endoscope was introduced through the mouth and advanced to the proximal jejunum jejunum. The prep was The overall prep quality was excellent.. The instrument was then slowly withdrawn while examining the mucosa circumferentially. The scope was then completely withdrawn from the patient and the procedure terminated. The pulse, BP, and O2 saturation were monitored and documented by  the physician and the nursing staff throughout the entire procedure.  The patient was cared for as planned according to standard protocol, then discharged to recovery in stable condition and with appropriate post procedure care. Estimated blood loss is zero unless otherwise noted in this procedure report. ESOPHAGUS: The mucosa of the esophagus appeared normal. STOMACH: The mucosa of the stomach appeared normal. DUODENUM: The duodenal mucosa showed no abnormalities in the entire duodenum. JEJUNUM: The exam showed no abnormalities to the proximal jejunum.     ADVERSE EVENTS:      There were no immediate complications.  IMPRESSIONS:     1.  Normal appearing enteroscopy to the proximal jejunum  RECOMMENDATIONS:     1. Presumed blood loss from SB AVMs. 2.  Avoid NSAIDS 3.  GI signing off. Additional recommendations per consult note   [name], MD, [facility] eSigned:  [name], MD, [facility] 10/16/2014 1:48 PM       PATIENT NAME:  Charles [name] MR#: [id number]

## 2014-10-16 NOTE — Progress Notes (Signed)
Utilization review completed. Scarlett Portlock, RN, BSN. 

## 2014-10-16 NOTE — Anesthesia Preprocedure Evaluation (Signed)
Anesthesia Evaluation    Reviewed: Allergy & Precautions, NPO status , Patient's Chart, lab work & pertinent test results  History of Anesthesia Complications Negative for: history of anesthetic complications  Airway Mallampati: II  TM Distance: >3 FB Neck ROM: Full    Dental  (+) Missing   Pulmonary sleep apnea , COPDformer smoker,  breath sounds clear to auscultation        Cardiovascular hypertension, Pt. on medications + angina + CAD, + Past MI, + Peripheral Vascular Disease and +CHF Rhythm:Regular     Neuro/Psych Seizures -,  Depression Bipolar Disorder Schizophrenia    GI/Hepatic Neg liver ROS, hiatal hernia,   Endo/Other    Renal/GU Renal InsufficiencyRenal disease     Musculoskeletal  (+) Arthritis -,   Abdominal   Peds  Hematology  (+) anemia ,   Anesthesia Other Findings Cocaine use, neg urine tox on this admission  Reproductive/Obstetrics                             Anesthesia Physical Anesthesia Plan  ASA: III  Anesthesia Plan: MAC   Post-op Pain Management:    Induction: Intravenous  Airway Management Planned: Nasal Cannula  Additional Equipment: None  Intra-op Plan:   Post-operative Plan:   Informed Consent: I have reviewed the patients History and Physical, chart, labs and discussed the procedure including the risks, benefits and alternatives for the proposed anesthesia with the patient or authorized representative who has indicated his/her understanding and acceptance.   Dental advisory given  Plan Discussed with: CRNA and Surgeon  Anesthesia Plan Comments:         Anesthesia Quick Evaluation

## 2014-10-17 DIAGNOSIS — I2510 Atherosclerotic heart disease of native coronary artery without angina pectoris: Secondary | ICD-10-CM

## 2014-10-17 DIAGNOSIS — I10 Essential (primary) hypertension: Secondary | ICD-10-CM

## 2014-10-17 LAB — BASIC METABOLIC PANEL
Anion gap: 7 (ref 5–15)
BUN: 22 mg/dL — ABNORMAL HIGH (ref 6–20)
CHLORIDE: 104 mmol/L (ref 101–111)
CO2: 24 mmol/L (ref 22–32)
CREATININE: 1.65 mg/dL — AB (ref 0.61–1.24)
Calcium: 8.2 mg/dL — ABNORMAL LOW (ref 8.9–10.3)
GFR calc Af Amer: 47 mL/min — ABNORMAL LOW (ref >60)
GFR calc non Af Amer: 40 mL/min — ABNORMAL LOW (ref >60)
GLUCOSE: 108 mg/dL — AB (ref 65–99)
POTASSIUM: 4.4 mmol/L (ref 3.5–5.1)
Sodium: 135 mmol/L (ref 135–145)

## 2014-10-17 LAB — CBC
HCT: 22 % — ABNORMAL LOW (ref 39.0–52.0)
HCT: 26.7 % — ABNORMAL LOW (ref 39.0–52.0)
HEMOGLOBIN: 6.6 g/dL — AB (ref 13.0–17.0)
Hemoglobin: 8.2 g/dL — ABNORMAL LOW (ref 13.0–17.0)
MCH: 26.5 pg (ref 26.0–34.0)
MCH: 26.7 pg (ref 26.0–34.0)
MCHC: 30 g/dL (ref 30.0–36.0)
MCHC: 30.7 g/dL (ref 30.0–36.0)
MCV: 88.4 fL (ref 78.0–100.0)
MCV: 87 fL (ref 78.0–100.0)
PLATELETS: 278 10*3/uL (ref 150–400)
Platelets: 279 10*3/uL (ref 150–400)
RBC: 2.49 MIL/uL — AB (ref 4.22–5.81)
RBC: 3.07 MIL/uL — ABNORMAL LOW (ref 4.22–5.81)
RDW: 16.9 % — ABNORMAL HIGH (ref 11.5–15.5)
RDW: 16.6 % — ABNORMAL HIGH (ref 11.5–15.5)
WBC: 4.8 10*3/uL (ref 4.0–10.5)
WBC: 4.7 10*3/uL (ref 4.0–10.5)

## 2014-10-17 LAB — PREPARE RBC (CROSSMATCH)

## 2014-10-17 MED ORDER — HYDROCODONE-ACETAMINOPHEN 5-325 MG PO TABS
1.0000 | ORAL_TABLET | ORAL | Status: DC | PRN
  Administered 2014-10-17 – 2014-10-18 (×5): 2 via ORAL
  Filled 2014-10-17 (×5): qty 2

## 2014-10-17 MED ORDER — FUROSEMIDE 80 MG PO TABS
80.0000 mg | ORAL_TABLET | Freq: Every day | ORAL | Status: DC
  Filled 2014-10-17: qty 1

## 2014-10-17 MED ORDER — SODIUM CHLORIDE 0.9 % IV SOLN
Freq: Once | INTRAVENOUS | Status: AC
  Administered 2014-10-17: 10 mL via INTRAVENOUS

## 2014-10-17 MED ORDER — SODIUM CHLORIDE 0.9 % IV SOLN
Freq: Once | INTRAVENOUS | Status: AC
  Administered 2014-10-17: 10 mL/h via INTRAVENOUS

## 2014-10-17 MED ORDER — MORPHINE SULFATE 2 MG/ML IJ SOLN
2.0000 mg | Freq: Once | INTRAMUSCULAR | Status: AC | PRN
  Administered 2014-10-18: 2 mg via INTRAVENOUS

## 2014-10-17 NOTE — Progress Notes (Signed)
Patient ID: Charles [name], male   DOB: August 23, 1945, 69 y.o.   MRN: [id number]     Subjective:    No complaints  Objective:   Temp:  [97 F (36.1 C)-98.5 F (36.9 C)] 97.5 F (36.4 C) (07/30 0900) Pulse Rate:  [58-93] 58 (07/30 0900) Resp:  [18-21] 18 (07/30 0900) BP: (110-169)/(55-100) 134/72 mmHg (07/30 0900) SpO2:  [94 %-100 %] 100 % (07/30 0900) Weight:  [161 lb 8 oz (73.256 kg)] 161 lb 8 oz (73.256 kg) (07/30 0643) Last BM Date: 10/16/14  Filed Weights   10/15/14 2021 10/17/14 0643  Weight: 162 lb 7.7 oz (73.7 kg) 161 lb 8 oz (73.256 kg)    Intake/Output Summary (Last 24 hours) at 10/17/14 1109 Last data filed at 10/17/14 0900  Gross per 24 hour  Intake    220 ml  Output    675 ml  Net   -455 ml    Exam:  General: NAD  Resp: CTAB  Cardiac: RRR, no m/r/g, noJVD  ZT:IWPYKDX soft, NT, ND  MSK:no LE edema  Neuro: no focal deficits  Lab Results:  Basic Metabolic Panel:  Recent Labs Lab 10/15/14 1536 10/16/14 0425 10/17/14 0448  NA 137 135 135  K 4.3 3.8 4.4  CL 106 104 104  CO2 23 23 24   GLUCOSE 80 89 108*  BUN 16 15 22*  CREATININE 1.06 1.08 1.65*  CALCIUM 8.7* 8.2* 8.2*    Liver Function Tests:  Recent Labs Lab 10/16/14 0425  AST 21  ALT 11*  ALKPHOS 68  BILITOT 0.5  PROT 6.5  ALBUMIN 2.9*    CBC:  Recent Labs Lab 10/15/14 1536 10/16/14 0425 10/17/14 0448  WBC 5.1 4.4 4.8  HGB 6.8* 8.0* 6.6*  HCT 22.2* 25.5* 22.0*  MCV 86.4 87.6 88.4  PLT 326 278 279    Cardiac Enzymes:  Recent Labs Lab 10/15/14 1536 10/15/14 2212 10/16/14 0425 10/16/14 1057  CKTOTAL 50  --   --   --   TROPONINI  --  0.07* 0.06* 0.06*    BNP:  Recent Labs  01/01/14 1107 02/07/14 0123 03/01/14 2250  PROBNP 3225.0* 2648.0* 7421.0*    Coagulation:  Recent Labs Lab 10/16/14 0425  INR 1.14    ECG:   Medications:   Scheduled Medications: . amLODipine  10 mg Oral Daily  . atorvastatin  80 mg Oral q1800  .  budesonide-formoterol  2 puff Inhalation BID  . citalopram  20 mg Oral Daily  . cloNIDine  0.3 mg Oral BID  . divalproex  500 mg Oral BID  . feeding supplement (ENSURE ENLIVE)  237 mL Oral TID BM  . ferrous sulfate  325 mg Oral BID WC  . [START ON 10/18/2014] furosemide  80 mg Oral Daily  . hydrALAZINE  100 mg Oral TID  . isosorbide mononitrate  60 mg Oral Daily  . lisinopril  20 mg Oral Daily  . metolazone  5 mg Oral Daily  . pantoprazole  40 mg Oral QAC breakfast  . phenytoin  200 mg Oral BID  . potassium chloride SA  20 mEq Oral Daily  . sodium chloride  3 mL Intravenous Q12H     Infusions: . sodium chloride 20 mL/hr (10/16/14 1510)  . lactated ringers       PRN Medications:  albuterol, HYDROcodone-acetaminophen, morphine injection, ondansetron **OR** ondansetron (ZOFRAN) IV     Assessment/Plan   1. Elevated troponin - mild 0.07 and trending down, occurred in setting of severe anemia  with Hgb of 6 and severe HTN with SBPs in 170s and cocaine +.. Chronic EKG changes, no acute ischemic findings - demand ischemia, no plan for ischemic testing. Continue management of bp and anemia  2. LV sysotlic/diastolic dysfunction - echo 05/2014 LVEF 45-50%, grade II diastolic dysfunction - he is on aggressive diuretic regimen at home with lasix 80mg  daily and metolazone 5mg  daily.  - initially on IV diuretics, back on orals. With AKI would hold his daily metolazone today, follow volume status with transfusions. He does not appear volume overloaded at this time.     3. HTN - severely elevated on admission, he was also cocaine +. Notes indicated mixed medication compliance - bp's currently controlled on orals.   4. GI bleed - management per GI and primary team.    Carlyle Dolly, M.D.

## 2014-10-17 NOTE — Progress Notes (Signed)
[facility] PROGRESS NOTE  Charles [name] GGY:[id number] DOB: 27-Aug-1945 DOA: 10/15/2014  PCP: No PCP Per Patient Per patient, he is planning to see Dr. Alyson Ingles in the near future.  Brief HPI: 69 year old African-American male with past medical history of coronary artery disease status post CABG with last Myoview in September 2015, which was negative for ischemia, diastolic CHF, history of GI bleeding with gastric and small bowel AVMs presented with chest pain. He also mentioned 2 episodes of passing bright red blood per rectum. He was hospitalized for further management.  Past medical history:  Past Medical History  Diagnosis Date  . Iron deficiency anemia   . Chronic combined systolic and diastolic CHF, NYHA class 2     a. 11/2013 Echo: EF 40-45%, basl-mid inflat AK, Gr 2 DD.  [name] Hyperlipidemia LDL goal <70   . Ischemic cardiomyopathy     a. 11/2013 Echo: EF 40-45%, basl-mid inflat AK, Gr 2 DD, mild AI, mod dil LA/RA, mod reduced RV fxn, PASP 35mmHg.  . Moderate to severe pulmonary hypertension 03/2010    a. PA peak pressure of 76 mmHg (per 2D Echo 03/2010)  . Polysubstance abuse     a. cocaine, THC  . AV malformation of gastrointestinal tract     a. w/ h/o GIB.  [name] Family history of early CAD   . Peripheral vascular disease   . Seizure disorder   . Hypertension   . CAD (coronary artery disease)     a. s/p 3-vessel CABG (12/2007) // 100% RCA stenosis with collaterals from left system. Severe bifurcation lesions of proxima CXA and OM. Moderate LAD disease - followed by Dr. Wyline Copas in [facility];  b. 11/2013 Myoview: Large fixed inf defect w/o ischemia, EF 35%.  . Chronic kidney disease (CKD), stage III (moderate)     a. BL SCr 1.5-1.6  . Renal artery stenosis   . Cancer     "I have cancer; right now it's not known what kind" (09/17/2014)  . Myocardial infarction 08/2007; 12/2007  . Anginal pain   . COPD (chronic obstructive pulmonary disease)   . Pneumonia     "I've had  pneumonia once" (09/17/2014)  . Obstructive sleep apnea     "never told me to wear mask" (09/17/2014)  . History of blood transfusion "I've had quite a few"    "they don't know where it's coming from" (09/17/2014)  . History of hiatal hernia   . Seizures     "I've had a few; don't know what kind they call it" (09/17/2014)  . DDD (degenerative disc disease), lumbar   . Arthritis     "ankles; left hip; wrists" (09/17/2014)  . Chronic lower back pain   . Depression   . Bipolar disorder   . Schizophrenia     Consultants: Cardiology and gastroenterology  Procedures:  Enteroscopy 7/29 IMPRESSIONS: 1. Normal appearing enteroscopy to the proximal jejunum  Antibiotics: None  Subjective: Patient denies any further episodes of rectal bleeding. Continues to mention severe left lower extremity pain. Some element of drug-seeking behavior. No mention of chest pain today. He was noted to be fast asleep when I walked into the room.   Objective: Vital Signs  Filed Vitals:   10/16/14 2108 10/17/14 0643 10/17/14 0845 10/17/14 0900  BP: 110/55 111/64 126/72 134/72  Pulse: 78 62 64 58  Temp: 98.5 F (36.9 C) 97.8 F (36.6 C) 97.8 F (36.6 C) 97.5 F (36.4 C)  TempSrc: Oral Oral Oral Oral  Resp:  21 19 18 18   Height:      Weight:  73.256 kg (161 lb 8 oz)    SpO2: 98% 100% 100% 100%    Intake/Output Summary (Last 24 hours) at 10/17/14 1033 Last data filed at 10/17/14 0900  Gross per 24 hour  Intake    220 ml  Output    675 ml  Net   -455 ml   Filed Weights   10/15/14 2021 10/17/14 0643  Weight: 73.7 kg (162 lb 7.7 oz) 73.256 kg (161 lb 8 oz)    General appearance: alert, cooperative, appears stated age and no distress Resp: clear to auscultation bilaterally Cardio: regular rate and rhythm, S1, S2 normal, no murmur, click, rub or gallop GI: soft, non-tender; bowel sounds normal; no masses,  no organomegaly Extremities: Some limitation in range of motion of the left hip. No acute  ischemic signs noted in the left lower extremity. No change in examination Neurologic: No focal deficits  Lab Results:  Basic Metabolic Panel:  Recent Labs Lab 10/15/14 1536 10/16/14 0425 10/17/14 0448  NA 137 135 135  K 4.3 3.8 4.4  CL 106 104 104  CO2 23 23 24   GLUCOSE 80 89 108*  BUN 16 15 22*  CREATININE 1.06 1.08 1.65*  CALCIUM 8.7* 8.2* 8.2*   Liver Function Tests:  Recent Labs Lab 10/16/14 0425  AST 21  ALT 11*  ALKPHOS 68  BILITOT 0.5  PROT 6.5  ALBUMIN 2.9*   CBC:  Recent Labs Lab 10/15/14 1536 10/16/14 0425 10/17/14 0448  WBC 5.1 4.4 4.8  NEUTROABS  --  3.2  --   HGB 6.8* 8.0* 6.6*  HCT 22.2* 25.5* 22.0*  MCV 86.4 87.6 88.4  PLT 326 278 279   Cardiac Enzymes:  Recent Labs Lab 10/15/14 1536 10/15/14 2212 10/16/14 0425 10/16/14 1057  CKTOTAL 50  --   --   --   TROPONINI  --  0.07* 0.06* 0.06*   BNP (last 3 results)  Recent Labs  09/15/14 1430 10/05/14 2201 10/15/14 1632  BNP 3756.9* 748.5* 2506.0*     Studies/Results: Dg Chest 2 View  10/15/2014   CLINICAL DATA:  Chest pain for 2 days.  EXAM: CHEST  2 VIEW  COMPARISON:  Chest x-rays dated 10/05/2014 and 08/10/2014. Comparison is also made to a CT angiogram of the neck and chest dated 10/06/2014.  FINDINGS: Cardiomegaly is unchanged. Again noted is central pulmonary vascular congestion and mild bilateral interstitial prominence most suggestive of interstitial edema. No confluent airspace opacity to suggest a developing pneumonia. No pleural effusion seen. No pneumothorax. Median sternotomy wires appear intact and stable in position. No acute osseous abnormality identified.  IMPRESSION: Cardiomegaly with central pulmonary vascular congestion and mild bilateral interstitial edema suggesting mild volume overload/congestive heart failure. This may be a chronic volume overload/congestive heart failure as the appearance is stable compared to multiple prior exams.   Electronically Signed   By:  Franki Cabot M.D.   On: 10/15/2014 15:21   Ct Angio Chest Aorta W/cm &/or Wo/cm  10/15/2014   CLINICAL DATA:  Chest pain.  Shortness of breath.  Low hemoglobin.  EXAM: CT ANGIOGRAPHY CHEST, ABDOMEN AND PELVIS  TECHNIQUE: Multidetector CT imaging through the chest, abdomen and pelvis was performed using the standard protocol during bolus administration of intravenous contrast. Multiplanar reconstructed images and MIPs were obtained and reviewed to evaluate the vascular anatomy.  CONTRAST:  130mL OMNIPAQUE IOHEXOL 350 MG/ML SOLN  COMPARISON:  None.  Chest x-ray dated  10/15/2014 and CT angiogram of the chest abdomen pelvis dated 10/06/2014  FINDINGS: CTA CHEST FINDINGS  There are no pulmonary emboli and there is no aortic dissection. There are scattered calcifications in the thoracic aorta. Extensive coronary artery calcification. Prior CABG. Cardiomegaly, unchanged since the prior exam. The patient has chronic interstitial lung disease with slight interstitial edema at the lung bases, improved since the prior CT scan. Tiny left pleural effusion.  Review of the MIP images confirms the above findings.  CTA ABDOMEN AND PELVIS FINDINGS  Extensive atherosclerosis of the arteries of the abdomen and pelvis. Severe bilateral proximal common iliac artery stenoses. No dissection or aneurysm. Single widely patent renal arteries bilaterally.  Liver, biliary tree, spleen, pancreas, adrenal glands and kidneys are normal except for an exophytic 12 mm hyperdense cyst on the lateral aspect of the mid left kidney, unchanged in size in densities since 03/08/2013.  The bowel is normal. Bladder is normal. No acute osseous abnormalities.  Review of the MIP images confirms the above findings.  IMPRESSION: 1. No acute abnormalities of the chest or abdomen. Specifically, no evidence of aortic dissection or pulmonary emboli. 2. Improved slight interstitial pulmonary edema at the lung bases superimposed on chronic interstitial lung disease.  3. Extensive aortic atherosclerosis with severe stenoses of the common iliac arteries bilaterally.   Electronically Signed   By: Lorriane Shire M.D.   On: 10/15/2014 17:29   Dg Hip Unilat With Pelvis 2-3 Views Left  10/16/2014   CLINICAL DATA:  Left hip pain.  EXAM: DG HIP (WITH OR WITHOUT PELVIS) 2-3V LEFT  COMPARISON:  None.  FINDINGS: Mild osteoarthritis of both hips seen in the frontal projection. Proliferative changes are seen adjacent to the greater trochanter. No evidence of acute fracture or dislocation. No bony lesions or destruction identified. Vascular calcifications are seen in the iliac and femoral arteries. The bony pelvis is intact and has a normal appearance. Visible degenerative disc disease in the lower lumbar spine. Soft tissues are unremarkable.  IMPRESSION: No fracture or dislocation.  Mild osteoarthritis of the left hip.   Electronically Signed   By: Aletta Edouard M.D.   On: 10/16/2014 16:31   Ct Cta Abd/pel W/cm &/or W/o Cm  10/15/2014   CLINICAL DATA:  Chest pain.  Shortness of breath.  Low hemoglobin.  EXAM: CT ANGIOGRAPHY CHEST, ABDOMEN AND PELVIS  TECHNIQUE: Multidetector CT imaging through the chest, abdomen and pelvis was performed using the standard protocol during bolus administration of intravenous contrast. Multiplanar reconstructed images and MIPs were obtained and reviewed to evaluate the vascular anatomy.  CONTRAST:  123mL OMNIPAQUE IOHEXOL 350 MG/ML SOLN  COMPARISON:  None.  Chest x-ray dated 10/15/2014 and CT angiogram of the chest abdomen pelvis dated 10/06/2014  FINDINGS: CTA CHEST FINDINGS  There are no pulmonary emboli and there is no aortic dissection. There are scattered calcifications in the thoracic aorta. Extensive coronary artery calcification. Prior CABG. Cardiomegaly, unchanged since the prior exam. The patient has chronic interstitial lung disease with slight interstitial edema at the lung bases, improved since the prior CT scan. Tiny left pleural effusion.   Review of the MIP images confirms the above findings.  CTA ABDOMEN AND PELVIS FINDINGS  Extensive atherosclerosis of the arteries of the abdomen and pelvis. Severe bilateral proximal common iliac artery stenoses. No dissection or aneurysm. Single widely patent renal arteries bilaterally.  Liver, biliary tree, spleen, pancreas, adrenal glands and kidneys are normal except for an exophytic 12 mm hyperdense cyst on the lateral aspect  of the mid left kidney, unchanged in size in densities since 03/08/2013.  The bowel is normal. Bladder is normal. No acute osseous abnormalities.  Review of the MIP images confirms the above findings.  IMPRESSION: 1. No acute abnormalities of the chest or abdomen. Specifically, no evidence of aortic dissection or pulmonary emboli. 2. Improved slight interstitial pulmonary edema at the lung bases superimposed on chronic interstitial lung disease. 3. Extensive aortic atherosclerosis with severe stenoses of the common iliac arteries bilaterally.   Electronically Signed   By: Lorriane Shire M.D.   On: 10/15/2014 17:29    Medications:  Scheduled: . amLODipine  10 mg Oral Daily  . atorvastatin  80 mg Oral q1800  . budesonide-formoterol  2 puff Inhalation BID  . citalopram  20 mg Oral Daily  . cloNIDine  0.3 mg Oral BID  . divalproex  500 mg Oral BID  . feeding supplement (ENSURE ENLIVE)  237 mL Oral TID BM  . ferrous sulfate  325 mg Oral BID WC  . furosemide  40 mg Intravenous Q12H  . hydrALAZINE  100 mg Oral TID  . isosorbide mononitrate  60 mg Oral Daily  . lisinopril  20 mg Oral Daily  . metolazone  5 mg Oral Daily  . pantoprazole  40 mg Oral QAC breakfast  . phenytoin  200 mg Oral BID  . potassium chloride SA  20 mEq Oral Daily  . sodium chloride  3 mL Intravenous Q12H   Continuous: . sodium chloride 20 mL/hr (10/16/14 1510)  . lactated ringers     ZDG:LOVFIEPPI, HYDROcodone-acetaminophen, morphine injection, ondansetron **OR** ondansetron (ZOFRAN)  IV  Assessment/Plan:  Active Problems:   Hypertension   CAD- CABG x3 '09. Myoview no ischemia 11/27/13   Chronic kidney disease (CKD), stage III (moderate)   GI bleed   Chest pain   Chronic diastolic heart failure   Pain in the chest   Bleeding gastrointestinal   Anemia, iron deficiency   Occult blood in stools    Chest pain with history of CAD status post CABG Minimal elevation in troponin, thought to be demand ischemia. Patient seen by cardiology. They do not plan any ischemic workup. He is back on his home medications. Patient will need to be compliant with his oral medications. This has been emphasized to the patient. Anemia could also have contributed to his pain.  GI bleed, likely secondary to AVM Patient is status post enteroscopy. No active bleeding noted. GI has signed off. No further episodes of rectal bleeding.  Acute blood loss anemia He received 1 unit of blood at the time of admission. Hemoglobin noted to be lower today. He'll be transfused another unit and repeat hemoglobin this evening. Hasn't had any recurrence of his GI bleed. Since there is an element of chronic GI blood. He may benefit from outpatient follow-up with hematology.  Mild acute on Chronic diastolic CHF Improved. Change to oral Lasix from tomorrow. Slight elevation in creatinine is noted. Lab will be repeated tomorrow morning.   Left leg pain/peripheral vascular disease Most likely secondary to peripheral vascular disease. There is no evidence for acute ischemia. There was some limitation in the range of motion of his left hip. X-ray of the left hip does suggest some arthritis but no other acute findings. He does appear to have some drug-seeking behavior. He'll be placed back on oral narcotics. He is supposed to follow-up with vascular surgery next month. No need to repeat arterial Dopplers as he underwent angiogram recently.  History  of seizures Continue Depakote and Dilantin  History of  polysubstance abuse, specifically cocaine Denies recent use. His urine drug screen was negative for cocaine.  History of essential hypertension Continue current medications.  DVT Prophylaxis: SCDs    Code Status: Full code  Family Communication: Discussed with the patient  Disposition Plan: Hold discharge as the patient is anemic requiring transfusion. Reevaluate tomorrow morning. Outpatient referral sent to hematology.   LOS: 2 days   [facility] Pager [phone] 10/17/2014, 10:33 AM  If 7PM-7AM, please contact night-coverage at [email or web address], password [facility]

## 2014-10-17 NOTE — Progress Notes (Signed)
CRITICAL VALUE ALERT  Critical value received: HGB 6.6  Date of notification:  10/17/2014  Time of notification:  0608  Critical value read back:Yes.    Nurse who received alert:  Alexia Freestone  MD notified (1st page):  Raliegh Ip Schorr    Time of first page:  0610  MD notified (2nd page):  Time of second page:  Responding MD:  Lamar Blinks  Time MD responded:  7290  Om Lizotte, RN

## 2014-10-18 DIAGNOSIS — I5032 Chronic diastolic (congestive) heart failure: Secondary | ICD-10-CM

## 2014-10-18 LAB — BASIC METABOLIC PANEL
Anion gap: 7 (ref 5–15)
BUN: 27 mg/dL — AB (ref 6–20)
CO2: 24 mmol/L (ref 22–32)
CREATININE: 1.41 mg/dL — AB (ref 0.61–1.24)
Calcium: 8.3 mg/dL — ABNORMAL LOW (ref 8.9–10.3)
Chloride: 102 mmol/L (ref 101–111)
GFR calc Af Amer: 57 mL/min — ABNORMAL LOW (ref >60)
GFR, EST NON AFRICAN AMERICAN: 49 mL/min — AB (ref >60)
GLUCOSE: 110 mg/dL — AB (ref 65–99)
Potassium: 4.3 mmol/L (ref 3.5–5.1)
Sodium: 133 mmol/L — ABNORMAL LOW (ref 135–145)

## 2014-10-18 LAB — TYPE AND SCREEN
ABO/RH(D): O POS
Antibody Screen: NEGATIVE
UNIT DIVISION: 0
Unit division: 0
Unit division: 0

## 2014-10-18 LAB — CBC
HCT: 27.3 % — ABNORMAL LOW (ref 39.0–52.0)
Hemoglobin: 8.6 g/dL — ABNORMAL LOW (ref 13.0–17.0)
MCH: 27 pg (ref 26.0–34.0)
MCHC: 31.5 g/dL (ref 30.0–36.0)
MCV: 85.8 fL (ref 78.0–100.0)
Platelets: 283 10*3/uL (ref 150–400)
RBC: 3.18 MIL/uL — AB (ref 4.22–5.81)
RDW: 16.8 % — ABNORMAL HIGH (ref 11.5–15.5)
WBC: 4.1 10*3/uL (ref 4.0–10.5)

## 2014-10-18 LAB — PREPARE RBC (CROSSMATCH)

## 2014-10-18 MED ORDER — TORSEMIDE 20 MG PO TABS: 40.0000 mg | ORAL_TABLET | Freq: Every day | ORAL | Status: AC

## 2014-10-18 MED ORDER — ASPIRIN EC 81 MG PO TBEC: 81.0000 mg | DELAYED_RELEASE_TABLET | Freq: Every day | ORAL | Status: AC

## 2014-10-18 MED ORDER — TORSEMIDE 20 MG PO TABS
40.0000 mg | ORAL_TABLET | Freq: Every day | ORAL | Status: DC
  Administered 2014-10-18: 40 mg via ORAL
  Filled 2014-10-18: qty 2

## 2014-10-18 MED ORDER — METOLAZONE 5 MG PO TABS: 5.0000 mg | ORAL_TABLET | ORAL | Status: AC | PRN

## 2014-10-18 MED ORDER — HYDROCODONE-ACETAMINOPHEN 5-325 MG PO TABS: 1.0000 | ORAL_TABLET | Freq: Four times a day (QID) | ORAL | Status: AC | PRN

## 2014-10-18 NOTE — Progress Notes (Signed)
Patient ID: Charles [name], male   DOB: January 07, 1946, 69 y.o.   MRN: [id number]     Subjective:    No complaints this AM  Objective:   Temp:  [97.5 F (36.4 C)-98.2 F (36.8 C)] 97.6 F (36.4 C) (07/31 0507) Pulse Rate:  [58-73] 64 (07/31 0507) Resp:  [17-18] 18 (07/31 0507) BP: (94-135)/(45-74) 116/52 mmHg (07/31 0507) SpO2:  [93 %-100 %] 100 % (07/31 0507) Weight:  [175 lb 7.8 oz (79.6 kg)] 175 lb 7.8 oz (79.6 kg) (07/31 0507) Last BM Date: 10/18/14  Filed Weights   10/15/14 2021 10/17/14 0643 10/18/14 0507  Weight: 162 lb 7.7 oz (73.7 kg) 161 lb 8 oz (73.256 kg) 175 lb 7.8 oz (79.6 kg)    Intake/Output Summary (Last 24 hours) at 10/18/14 0833 Last data filed at 10/18/14 0401  Gross per 24 hour  Intake 1694.25 ml  Output   2000 ml  Net -305.75 ml    Telemetry: NSR  Exam:  General: NAD  Resp: CTAB  Cardiac: RRR, no m/r/g, no JVD  GI: abdomen soft, NT, ND  MSK: no LE edema  Neuro: no focal deficits  Psych: appropriate affect  Lab Results:  Basic Metabolic Panel:  Recent Labs Lab 10/16/14 0425 10/17/14 0448 10/18/14 0439  NA 135 135 133*  K 3.8 4.4 4.3  CL 104 104 102  CO2 23 24 24   GLUCOSE 89 108* 110*  BUN 15 22* 27*  CREATININE 1.08 1.65* 1.41*  CALCIUM 8.2* 8.2* 8.3*    Liver Function Tests:  Recent Labs Lab 10/16/14 0425  AST 21  ALT 11*  ALKPHOS 68  BILITOT 0.5  PROT 6.5  ALBUMIN 2.9*    CBC:  Recent Labs Lab 10/17/14 0448 10/17/14 1159 10/18/14 0439  WBC 4.8 4.7 4.1  HGB 6.6* 8.2* 8.6*  HCT 22.0* 26.7* 27.3*  MCV 88.4 87.0 85.8  PLT 279 278 283    Cardiac Enzymes:  Recent Labs Lab 10/15/14 1536 10/15/14 2212 10/16/14 0425 10/16/14 1057  CKTOTAL 50  --   --   --   TROPONINI  --  0.07* 0.06* 0.06*    BNP:  Recent Labs  01/01/14 1107 02/07/14 0123 03/01/14 2250  PROBNP 3225.0* 2648.0* 7421.0*    Coagulation:  Recent Labs Lab 10/16/14 0425  INR 1.14    ECG:   Medications:     Scheduled Medications: . amLODipine  10 mg Oral Daily  . atorvastatin  80 mg Oral q1800  . budesonide-formoterol  2 puff Inhalation BID  . citalopram  20 mg Oral Daily  . cloNIDine  0.3 mg Oral BID  . divalproex  500 mg Oral BID  . feeding supplement (ENSURE ENLIVE)  237 mL Oral TID BM  . ferrous sulfate  325 mg Oral BID WC  . furosemide  80 mg Oral Daily  . hydrALAZINE  100 mg Oral TID  . isosorbide mononitrate  60 mg Oral Daily  . lisinopril  20 mg Oral Daily  . pantoprazole  40 mg Oral QAC breakfast  . phenytoin  200 mg Oral BID  . potassium chloride SA  20 mEq Oral Daily  . sodium chloride  3 mL Intravenous Q12H     Infusions: . sodium chloride 20 mL/hr (10/16/14 1510)  . lactated ringers       PRN Medications:  albuterol, HYDROcodone-acetaminophen, morphine injection, ondansetron **OR** ondansetron (ZOFRAN) IV     Assessment/Plan   1. Elevated troponin - mild 0.07 and trending down, occurred in  setting of severe anemia with Hgb of 6 and severe HTN with SBPs in 170s and cocaine +.. Chronic EKG changes, no acute ischemic findings - demand ischemia, no plan for ischemic testing. Continue management of bp and anemia  2. LV sysotlic/diastolic dysfunction - echo 05/2014 LVEF 45-50%, grade II diastolic dysfunction - he is on aggressive diuretic regimen at home with lasix 80mg  daily and metolazone 5mg  daily.  - initially on IV diuretics, back on orals. With AKI would hold his daily metolazone today, follow volume status with transfusions. He does not appear volume overloaded at this time. Cr trending down from yesterday.  - would try to avoid daily metolazone due to AKI and electrolyte risk, would prefer changing furosemide to toresmide with metolazone only prn at home.    3. HTN - severely elevated on admission, he was also cocaine +. Notes indicated mixed medication compliance - bp's currently controlled on orals.   4. GI bleed - management per GI and primary team.          Carlyle Dolly, M.D

## 2014-10-18 NOTE — Progress Notes (Signed)
Pt discharged home per MD order, D/C instructions reviewed with pt, all questions answered. Pt aware of follow up appts., Pt given prescription for Vicodin 5-325mg  and Torsemide, IV removed and site looks clean and intact, Pt verbalized understanding of discharged instructions.  Social worker was on unable to get a voucher for pt, she called pt' daughter for a ride but no response. Pt was in a hurry to leave, CN, RN, Education officer, museum and security try to convince the pt to wait for her daughter but he insisted on leaving stating that he will call his daughter for a  ride when he gets downstairs. [place] [employer] department was notified of pt discharge since he had house arrest bracelet on. Pt was transported down stair via wheelchair by one of the CN, stated that pt did called his daughter.

## 2014-10-18 NOTE — Progress Notes (Signed)
CSW notified by nursing to arrange a taxi for patient. This CSW is very aware of patient from his multiple, frequent admissions to the hospital. With the recent d/c's from the hospital- CSW has explained that a tax voucher cannot continue to provided for d/c to [facility] and that he must secure other means of transportation. He does not meet criteria for EMS transport (he is ambulatory without assistance).  He cannot utilize the bus system as he has a lengthy walk from the bus station.  CSW had attempted to reach patient's "daughter" who has come to pick him on each of his last admissions and had attempted without success to reach her. CSW offered to continue to try to reach her but patient was unwilling to wait.  He was very angry and refused to wait any longer in his room. Stated that he would call his daughter once he was at the front entrance.  Patient is alert and oriented and is able to verbalize his wishes clearly.  Thus- patient escorted to the front entrance and nursing reports that he did contact his daughter to come and pick him up.  CSW signing off.  Lorie Phenix. Pauline Good, [place] (weekend coverage)

## 2014-10-18 NOTE — Discharge Summary (Addendum)
[facility]  Physician Discharge Summary   Patient ID: Charles [name] MRN: [id number] DOB/AGE: 1945-08-21 69 y.o.  Admit date: 10/15/2014 Discharge date: 10/18/2014  PCP: Patient planning on seeing Dr. Alyson Ingles this Friday  DISCHARGE DIAGNOSES:  Active Problems:   Hypertension   CAD- CABG x3 '09. Myoview no ischemia 11/27/13   Chronic kidney disease (CKD), stage III (moderate)   GI bleed   Chest pain   Chronic diastolic heart failure   Pain in the chest   Bleeding gastrointestinal   Anemia, iron deficiency   Occult blood in stools   RECOMMENDATIONS FOR OUTPATIENT FOLLOW UP: 1. 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 He also mentioned 2 episodes of passing bright red blood per rectum. He was hospitalized for further management.  Consultants: Cardiology and gastroenterology  Procedures:  Enteroscopy 7/29 IMPRESSIONS: 1. Normal appearing enteroscopy to the proximal jejunum   HOSPITAL COURSE:   Chest pain with history of CAD status post CABG Minimal elevation in troponin, thought to be demand ischemia. Patient seen by cardiology. They do not plan any ischemic workup. He is back on his home medications. Patient will need to be compliant with his oral medications. This has been emphasized to the patient. Anemia could also have contributed to his pain. He should follow-up with his cardiologist in [facility].  GI bleed,  likely secondary to AVM Patient is status post enteroscopy. No active bleeding noted. GI has signed off. No further episodes of rectal bleeding.  Acute blood loss anemia Patient was hospitalized. He received 3 units of PRBCs during this hospital stay. Hasn't had any further episodes of bleeding. Since there is an element of chronic GI blood he may benefit from outpatient follow-up with hematology. Outpatient referral made.  Mild acute on Chronic systolic and diastolic CHF Improved. Initially he was on IV Lasix. Creatinine did increase some. Changed over to oral Lasix. Creatinine is better today. He is well compensated. Cardiology changed him over to torsemide today.  Left leg pain/peripheral vascular disease Most likely secondary to peripheral vascular disease. There is no evidence for acute ischemia. There was some limitation in the range of motion of his left hip. X-ray of the left hip does suggest some arthritis but no other acute findings. He was noted to ambulate without any difficulty. He does appear to have some drug-seeking behavior. He is supposed to follow-up with vascular surgery next month. No need to repeat arterial Dopplers as he underwent angiogram recently. We will discharge him on a few tablets of Vicodin.  History of seizures Continue Depakote and Dilantin. No seizures during this hospitalization.  History of polysubstance abuse, specifically cocaine Denies recent use. His urine drug screen was negative for cocaine.  History of essential hypertension Continue current medications.  Overall, stable. Okay for discharge today.  PERTINENT LABS:  The results of significant diagnostics from this hospitalization (including imaging, microbiology, ancillary and laboratory) are listed below for reference.  Labs: Basic Metabolic Panel:  Recent Labs Lab 10/15/14 1536 10/16/14 0425 10/17/14 0448 10/18/14 0439  NA 137 135 135 133*  K 4.3 3.8 4.4 4.3  CL 106 104 104 102    CO2 23 23 24 24   GLUCOSE 80 89 108* 110*  BUN 16 15 22* 27*  CREATININE 1.06 1.08 1.65* 1.41*  CALCIUM 8.7* 8.2* 8.2* 8.3*   Liver Function Tests:  Recent Labs Lab 10/16/14 0425  AST 21  ALT 11*  ALKPHOS 68  BILITOT 0.5  PROT 6.5  ALBUMIN 2.9*   CBC:  Recent Labs Lab 10/15/14 1536 10/16/14 0425 10/17/14 0448 10/17/14 1159 10/18/14 0439  WBC 5.1 4.4 4.8 4.7 4.1  NEUTROABS  --  3.2  --   --   --   HGB 6.8* 8.0* 6.6* 8.2* 8.6*  HCT 22.2* 25.5* 22.0* 26.7* 27.3*  MCV 86.4 87.6 88.4 87.0 85.8  PLT 326 278 279 278 283   Cardiac Enzymes:  Recent Labs Lab 10/15/14 1536 10/15/14 2212 10/16/14 0425 10/16/14 1057  CKTOTAL 50  --   --   --   TROPONINI  --  0.07* 0.06* 0.06*   BNP: BNP (last 3 results)  Recent Labs  09/15/14 1430 10/05/14 2201 10/15/14 1632  BNP 3756.9* 748.5* 2506.0*    IMAGING STUDIES Dg Chest 2 View  10/15/2014   CLINICAL DATA:  Chest pain for 2 days.  EXAM: CHEST  2 VIEW  COMPARISON:  Chest x-rays dated 10/05/2014 and 08/10/2014. Comparison is also made to a CT angiogram of the neck and chest dated 10/06/2014.  FINDINGS: Cardiomegaly is unchanged. Again noted is central pulmonary vascular congestion and mild bilateral interstitial prominence most suggestive of interstitial edema. No confluent airspace opacity to suggest a developing pneumonia. No pleural effusion seen. No pneumothorax. Median sternotomy wires appear intact and stable in position. No acute osseous abnormality identified.  IMPRESSION: Cardiomegaly with central pulmonary vascular congestion and mild bilateral interstitial edema suggesting mild volume overload/congestive heart failure. This may be a chronic volume overload/congestive heart failure as the appearance is stable compared to multiple prior exams.   Electronically Signed   By: Franki Cabot M.D.   On: 10/15/2014 15:21   Ct Angio Chest Aorta W/cm &/or Wo/cm  10/15/2014   CLINICAL DATA:  Chest pain.  Shortness of breath.   Low hemoglobin.  EXAM: CT ANGIOGRAPHY CHEST, ABDOMEN AND PELVIS  TECHNIQUE: Multidetector CT imaging through the chest, abdomen and pelvis was performed using the standard protocol during bolus administration of intravenous contrast. Multiplanar reconstructed images and MIPs were obtained and reviewed to evaluate the vascular anatomy.  CONTRAST:  133mL OMNIPAQUE IOHEXOL 350 MG/ML SOLN  COMPARISON:  None.  Chest x-ray dated 10/15/2014 and CT angiogram of the chest abdomen pelvis dated 10/06/2014  FINDINGS: CTA CHEST FINDINGS  There are no pulmonary emboli and there is no aortic dissection. There are scattered calcifications in the thoracic aorta. Extensive coronary artery calcification. Prior CABG. Cardiomegaly, unchanged since the prior exam. The patient has chronic interstitial lung disease with slight interstitial edema at the lung bases, improved since the prior CT scan. Tiny left pleural effusion.  Review of the MIP images confirms the above findings.  CTA ABDOMEN AND PELVIS FINDINGS  Extensive atherosclerosis of the arteries of the abdomen and pelvis. Severe bilateral proximal common iliac artery stenoses. No dissection or aneurysm. Single widely patent renal arteries bilaterally.  Liver, biliary tree, spleen, pancreas, adrenal glands and kidneys are normal except for an exophytic 12 mm hyperdense  cyst on the lateral aspect of the mid left kidney, unchanged in size in densities since 03/08/2013.  The bowel is normal. Bladder is normal. No acute osseous abnormalities.  Review of the MIP images confirms the above findings.  IMPRESSION: 1. No acute abnormalities of the chest or abdomen. Specifically, no evidence of aortic dissection or pulmonary emboli. 2. Improved slight interstitial pulmonary edema at the lung bases superimposed on chronic interstitial lung disease. 3. Extensive aortic atherosclerosis with severe stenoses of the common iliac arteries bilaterally.   Electronically Signed   By: Lorriane Shire M.D.    On: 10/15/2014 17:29   Dg Hip Unilat With Pelvis 2-3 Views Left  10/16/2014   CLINICAL DATA:  Left hip pain.  EXAM: DG HIP (WITH OR WITHOUT PELVIS) 2-3V LEFT  COMPARISON:  None.  FINDINGS: Mild osteoarthritis of both hips seen in the frontal projection. Proliferative changes are seen adjacent to the greater trochanter. No evidence of acute fracture or dislocation. No bony lesions or destruction identified. Vascular calcifications are seen in the iliac and femoral arteries. The bony pelvis is intact and has a normal appearance. Visible degenerative disc disease in the lower lumbar spine. Soft tissues are unremarkable.  IMPRESSION: No fracture or dislocation.  Mild osteoarthritis of the left hip.   Electronically Signed   By: Aletta Edouard M.D.   On: 10/16/2014 16:31   Ct Cta Abd/pel W/cm &/or W/o Cm  10/15/2014   CLINICAL DATA:  Chest pain.  Shortness of breath.  Low hemoglobin.  EXAM: CT ANGIOGRAPHY CHEST, ABDOMEN AND PELVIS  TECHNIQUE: Multidetector CT imaging through the chest, abdomen and pelvis was performed using the standard protocol during bolus administration of intravenous contrast. Multiplanar reconstructed images and MIPs were obtained and reviewed to evaluate the vascular anatomy.  CONTRAST:  156mL OMNIPAQUE IOHEXOL 350 MG/ML SOLN  COMPARISON:  None.  Chest x-ray dated 10/15/2014 and CT angiogram of the chest abdomen pelvis dated 10/06/2014  FINDINGS: CTA CHEST FINDINGS  There are no pulmonary emboli and there is no aortic dissection. There are scattered calcifications in the thoracic aorta. Extensive coronary artery calcification. Prior CABG. Cardiomegaly, unchanged since the prior exam. The patient has chronic interstitial lung disease with slight interstitial edema at the lung bases, improved since the prior CT scan. Tiny left pleural effusion.  Review of the MIP images confirms the above findings.  CTA ABDOMEN AND PELVIS FINDINGS  Extensive atherosclerosis of the arteries of the abdomen and  pelvis. Severe bilateral proximal common iliac artery stenoses. No dissection or aneurysm. Single widely patent renal arteries bilaterally.  Liver, biliary tree, spleen, pancreas, adrenal glands and kidneys are normal except for an exophytic 12 mm hyperdense cyst on the lateral aspect of the mid left kidney, unchanged in size in densities since 03/08/2013.  The bowel is normal. Bladder is normal. No acute osseous abnormalities.  Review of the MIP images confirms the above findings.  IMPRESSION: 1. No acute abnormalities of the chest or abdomen. Specifically, no evidence of aortic dissection or pulmonary emboli. 2. Improved slight interstitial pulmonary edema at the lung bases superimposed on chronic interstitial lung disease. 3. Extensive aortic atherosclerosis with severe stenoses of the common iliac arteries bilaterally.   Electronically Signed   By: Lorriane Shire M.D.   On: 10/15/2014 17:29    DISCHARGE EXAMINATION: Filed Vitals:   10/17/14 2127 10/18/14 0507 10/18/14 1026 10/18/14 1110  BP:  116/52 176/97 148/79  Pulse:  64 78   Temp:  97.6 F (36.4 C) 97.5  F (36.4 C)   TempSrc:  Oral Oral   Resp:  18    Height:      Weight:  79.6 kg (175 lb 7.8 oz)    SpO2: 98% 100% 100%    General appearance: alert, cooperative and no distress Resp: clear to auscultation bilaterally Cardio: regular rate and rhythm, S1, S2 normal, no murmur, click, rub or gallop GI: soft, non-tender; bowel sounds normal; no masses,  no organomegaly  DISPOSITION: Home with daughter  Discharge Instructions    Ambulatory referral to Hematology    Complete by:  As directed   For anemia from chronic gi blood loss     Call MD for:  difficulty breathing, headache or visual disturbances    Complete by:  As directed      Call MD for:  extreme fatigue    Complete by:  As directed      Call MD for:  persistant dizziness or light-headedness    Complete by:  As directed      Call MD for:  persistant nausea and vomiting     Complete by:  As directed      Call MD for:  severe uncontrolled pain    Complete by:  As directed      Call MD for:  temperature >100.4    Complete by:  As directed      Diet - low sodium heart healthy    Complete by:  As directed      Discharge instructions    Complete by:  As directed   Please follow up with your PCP in 1 week. Follow up with cardiologist and the vascular surgeon as well.  You were cared for by a hospitalist during your hospital stay. If you have any questions about your discharge medications or the care you received while you were in the hospital after you are discharged, you can call the unit and asked to speak with the hospitalist on call if the hospitalist that took care of you is not available. Once you are discharged, your primary care physician will handle any further medical issues. Please note that NO REFILLS for any discharge medications will be authorized once you are discharged, as it is imperative that you return to your primary care physician (or establish a relationship with a primary care physician if you do not have one) for your aftercare needs so that they can reassess your need for medications and monitor your lab values. If you do not have a primary care physician, you can call [phone] for a physician referral.     Increase activity slowly    Complete by:  As directed            ALLERGIES:  Allergies  Allergen Reactions  . Motrin [Ibuprofen] Other (See Comments)    Affects kidneys  . Tylenol [Acetaminophen] Other (See Comments)    Affects kidneys      Discharge Medication List as of 10/18/2014 10:44 AM    CONTINUE these medications which have CHANGED   Details  aspirin EC 81 MG tablet Take 1 tablet (81 mg total) by mouth daily. RESUME AFTER 1 WEEK., Starting 10/18/2014, Until Discontinued, No Print    HYDROcodone-acetaminophen (NORCO/VICODIN) 5-325 MG per tablet Take 1-2 tablets by mouth every 6 (six) hours as needed for moderate pain.,  Starting 10/18/2014, Until Discontinued, Print    metolazone (ZAROXOLYN) 5 MG tablet Take 1 tablet (5 mg total) by mouth as needed (for weight gain of 2 lbs  in 2-3 days)., Starting 10/18/2014, Until Discontinued, No Print    torsemide (DEMADEX) 20 MG tablet Take 2 tablets (40 mg total) by mouth daily., Starting 10/18/2014, Until Discontinued, Print      CONTINUE these medications which have NOT CHANGED   Details  albuterol (PROVENTIL HFA;VENTOLIN HFA) 108 (90 BASE) MCG/ACT inhaler Inhale 2 puffs into the lungs every 6 (six) hours as needed for wheezing or shortness of breath., Starting 04/30/2014, Until Discontinued, Normal    amLODipine (NORVASC) 10 MG tablet Take 1 tablet (10 mg total) by mouth daily., Starting 09/18/2014, Until Discontinued, Print    budesonide-formoterol (SYMBICORT) 80-4.5 MCG/ACT inhaler Inhale 2 puffs into the lungs 2 (two) times daily., Starting 09/08/2014, Until Discontinued, Print    citalopram (CELEXA) 20 MG tablet Take 20 mg by mouth daily., Until Discontinued, Historical Med    cloNIDine (CATAPRES) 0.3 MG tablet Take 1 tablet (0.3 mg total) by mouth 2 (two) times daily., Starting 09/14/2014, Until Discontinued, Normal    diclofenac sodium (VOLTAREN) 1 % GEL Apply 4 g topically 4 (four) times daily., Starting 09/22/2014, Until Discontinued, Print    divalproex (DEPAKOTE ER) 500 MG 24 hr tablet Take 500 mg by mouth 2 (two) times daily., Starting 05/29/2014, Until Discontinued, Historical Med    ferrous sulfate 325 (65 FE) MG tablet Take 325 mg by mouth 2 (two) times daily with a meal. , Until Discontinued, Historical Med    hydrALAZINE (APRESOLINE) 100 MG tablet Take 100 mg by mouth 3 (three) times daily., Starting 05/29/2014, Until Discontinued, Historical Med    isosorbide mononitrate (IMDUR) 30 MG 24 hr tablet Take 2 tablets (60 mg total) by mouth daily., Starting 07/26/2014, Until Discontinued, Print    lisinopril (PRINIVIL,ZESTRIL) 20 MG tablet Take 20 mg by mouth  daily., Starting 10/01/2014, Until Discontinued, Historical Med    pantoprazole (PROTONIX) 40 MG tablet Take 40 mg by mouth daily. , Until Discontinued, Historical Med    !! phenytoin (DILANTIN) 100 MG ER capsule Take 200 mg by mouth at bedtime., Starting 10/01/2014, Until Discontinued, Historical Med    !! phenytoin (DILANTIN) 200 MG ER capsule Take 200 mg by mouth daily., Starting 10/01/2014, Until Discontinued, Historical Med    potassium chloride SA (KLOR-CON M20) 20 MEQ tablet Take 20 mEq by mouth daily., Starting 01/17/2010, Until Discontinued, Historical Med    atorvastatin (LIPITOR) 80 MG tablet Take 1 tablet (80 mg total) by mouth daily at 6 PM., Starting 10/07/2014, Until Discontinued, Print    nitroGLYCERIN (NITROSTAT) 0.4 MG SL tablet Place 1 tablet (0.4 mg total) under the tongue every 5 (five) minutes as needed for chest pain (upto max 3 doses at one time.)., Starting 07/26/2014, Until Discontinued, Print     !! - Potential duplicate medications found. Please discuss with provider.    STOP taking these medications     furosemide (LASIX) 80 MG tablet        Follow-up Information    Follow up with Rozann Lesches, MD. Schedule an appointment as soon as possible for a visit in 1 week.   Specialty:  Cardiology   Why:  post hospitalization follow up   Contact information:   [place] [address] [place] [address] [phone]       Follow up with Deitra Mayo, MD.   Specialties:  Vascular Surgery, Cardiology   Why:  as scheduled for Peripheral vascular disease   Contact information:   [place] [address] [phone]  TOTAL DISCHARGE TIME: 35 minutes  [facility] Pager [phone]  10/18/2014, 2:41 PM

## 2014-10-18 NOTE — Progress Notes (Signed)
When patient got first dose of morphine this evening, he said the doctor told him he should now be getting 2 mg morphine IV instead of 1 mg like he's been getting before.  The patient wanted his nurse to ask the doctor about his medicine, so I paged the night time hospitalist.  When I informed the hospitalist of what the patient wanted she said there was nothing in the notes that said about increasing the patient's morphine dosage to 2 mg.  The hospitalist decided to give the patient a one time order for morphine 2 mg IV.  Will continue to monitor the patient. Lupita Dawn, RN

## 2014-10-18 NOTE — Discharge Instructions (Signed)

## 2014-10-19 ENCOUNTER — Encounter (HOSPITAL_COMMUNITY): Payer: Self-pay | Admitting: Gastroenterology

## 2014-10-22 ENCOUNTER — Encounter: Payer: Medicare Other | Admitting: Nurse Practitioner

## 2014-10-28 ENCOUNTER — Encounter (HOSPITAL_COMMUNITY): Payer: Self-pay | Admitting: Emergency Medicine

## 2014-10-28 ENCOUNTER — Inpatient Hospital Stay (HOSPITAL_COMMUNITY)
Admission: EM | Admit: 2014-10-28 | Discharge: 2014-10-31 | DRG: 101 | Disposition: A | Discharge status: Home / Self Care | Payer: Medicare Other | Attending: Internal Medicine | Admitting: Internal Medicine

## 2014-10-28 DIAGNOSIS — I209 Angina pectoris, unspecified: Secondary | ICD-10-CM | POA: Diagnosis present

## 2014-10-28 DIAGNOSIS — Z79891 Long term (current) use of opiate analgesic: Secondary | ICD-10-CM

## 2014-10-28 DIAGNOSIS — I252 Old myocardial infarction: Secondary | ICD-10-CM

## 2014-10-28 DIAGNOSIS — Q2733 Arteriovenous malformation of digestive system vessel: Secondary | ICD-10-CM

## 2014-10-28 DIAGNOSIS — N183 Chronic kidney disease, stage 3 (moderate): Secondary | ICD-10-CM | POA: Diagnosis present

## 2014-10-28 DIAGNOSIS — Z8249 Family history of ischemic heart disease and other diseases of the circulatory system: Secondary | ICD-10-CM

## 2014-10-28 DIAGNOSIS — F209 Schizophrenia, unspecified: Secondary | ICD-10-CM | POA: Diagnosis present

## 2014-10-28 DIAGNOSIS — E785 Hyperlipidemia, unspecified: Secondary | ICD-10-CM | POA: Diagnosis present

## 2014-10-28 DIAGNOSIS — I214 Non-ST elevation (NSTEMI) myocardial infarction: Secondary | ICD-10-CM

## 2014-10-28 DIAGNOSIS — G40909 Epilepsy, unspecified, not intractable, without status epilepticus: Principal | ICD-10-CM

## 2014-10-28 DIAGNOSIS — M5136 Other intervertebral disc degeneration, lumbar region: Secondary | ICD-10-CM | POA: Diagnosis present

## 2014-10-28 DIAGNOSIS — R778 Other specified abnormalities of plasma proteins: Secondary | ICD-10-CM

## 2014-10-28 DIAGNOSIS — Z7982 Long term (current) use of aspirin: Secondary | ICD-10-CM

## 2014-10-28 DIAGNOSIS — R7989 Other specified abnormal findings of blood chemistry: Secondary | ICD-10-CM | POA: Diagnosis not present

## 2014-10-28 DIAGNOSIS — E876 Hypokalemia: Secondary | ICD-10-CM | POA: Diagnosis present

## 2014-10-28 DIAGNOSIS — Z79899 Other long term (current) drug therapy: Secondary | ICD-10-CM

## 2014-10-28 DIAGNOSIS — I2510 Atherosclerotic heart disease of native coronary artery without angina pectoris: Secondary | ICD-10-CM | POA: Diagnosis present

## 2014-10-28 DIAGNOSIS — J449 Chronic obstructive pulmonary disease, unspecified: Secondary | ICD-10-CM | POA: Diagnosis present

## 2014-10-28 DIAGNOSIS — Z951 Presence of aortocoronary bypass graft: Secondary | ICD-10-CM

## 2014-10-28 DIAGNOSIS — R112 Nausea with vomiting, unspecified: Secondary | ICD-10-CM | POA: Diagnosis present

## 2014-10-28 DIAGNOSIS — Z87891 Personal history of nicotine dependence: Secondary | ICD-10-CM

## 2014-10-28 DIAGNOSIS — I739 Peripheral vascular disease, unspecified: Secondary | ICD-10-CM | POA: Diagnosis present

## 2014-10-28 DIAGNOSIS — Z888 Allergy status to other drugs, medicaments and biological substances status: Secondary | ICD-10-CM

## 2014-10-28 DIAGNOSIS — Z9119 Patient's noncompliance with other medical treatment and regimen: Secondary | ICD-10-CM | POA: Diagnosis present

## 2014-10-28 DIAGNOSIS — F329 Major depressive disorder, single episode, unspecified: Secondary | ICD-10-CM | POA: Diagnosis present

## 2014-10-28 DIAGNOSIS — F1410 Cocaine abuse, uncomplicated: Secondary | ICD-10-CM | POA: Diagnosis present

## 2014-10-28 DIAGNOSIS — I701 Atherosclerosis of renal artery: Secondary | ICD-10-CM | POA: Diagnosis present

## 2014-10-28 DIAGNOSIS — I5032 Chronic diastolic (congestive) heart failure: Secondary | ICD-10-CM | POA: Diagnosis present

## 2014-10-28 DIAGNOSIS — G4733 Obstructive sleep apnea (adult) (pediatric): Secondary | ICD-10-CM | POA: Diagnosis present

## 2014-10-28 DIAGNOSIS — T502X5A Adverse effect of carbonic-anhydrase inhibitors, benzothiadiazides and other diuretics, initial encounter: Secondary | ICD-10-CM | POA: Diagnosis present

## 2014-10-28 DIAGNOSIS — I255 Ischemic cardiomyopathy: Secondary | ICD-10-CM | POA: Diagnosis present

## 2014-10-28 DIAGNOSIS — Z883 Allergy status to other anti-infective agents status: Secondary | ICD-10-CM

## 2014-10-28 DIAGNOSIS — N179 Acute kidney failure, unspecified: Secondary | ICD-10-CM | POA: Diagnosis present

## 2014-10-28 DIAGNOSIS — R079 Chest pain, unspecified: Secondary | ICD-10-CM

## 2014-10-28 HISTORY — DX: Benign prostatic hyperplasia without lower urinary tract symptoms: N40.0

## 2014-10-28 NOTE — ED Provider Notes (Signed)
CSN: [id number]     Arrival date & time 10/28/14  2344 History  This chart was scribed for Varney Biles, MD by Irene Pap, ED Scribe. This patient was seen in room A03C/A03C and patient care was started at 12:21 AM.   Chief Complaint  Patient presents with  . Chest Pain  . Fever   The history is provided by the patient. No language interpreter was used.  HPI Comments: Charles [name] is a 69 y.o. male with hx of systolic and diastolic CHF, CAD, MI, COPD, severe pulmonary HTN, ischemic cardiomyopathy, and PVD who presents to the Emergency Department complaining of chills, generalized, non-radiating, dull, waxing and waning abdominal pain that can last up to 30 minutes, nausea, non-productive cough, SOB, and fever onset 10 hours ago. He states that the left sided, sharp, non-radiating chest pain started on his way to the ED. He states that he ate lunch, but did not eat dinner because of his abdominal pain. He reports 3 episodes of emesis, but had a normal BM this morning. He states that the SOB will come on no matter the level of exertion. He denies any worsening or alleviating factors. He reports a hx of triple bypass and MI in 2009. He denies that he did not feel pain during the MI like his current symptoms, but reported dizziness and nausea. He states that he last used cocaine a few weeks ago where he smoked once a week. He states that he recently got a blood transfusion 2 weeks ago. He denies diarrhea, current hematochezia, or diaphoresis. He denies hx of abdominal surgeries.   Past Medical History  Diagnosis Date  . Iron deficiency anemia   . Chronic combined systolic and diastolic CHF, NYHA class 2     a. 11/2013 Echo: EF 40-45%, basl-mid inflat AK, Gr 2 DD.  [name] Hyperlipidemia LDL goal <70   . Ischemic cardiomyopathy     a. 11/2013 Echo: EF 40-45%, basl-mid inflat AK, Gr 2 DD, mild AI, mod dil LA/RA, mod reduced RV fxn, PASP 86mmHg.  . Moderate to severe pulmonary hypertension 03/2010     a. PA peak pressure of 76 mmHg (per 2D Echo 03/2010)  . Polysubstance abuse     a. cocaine, THC  . AV malformation of gastrointestinal tract     a. w/ h/o GIB.  [name] Family history of early CAD   . Peripheral vascular disease   . Seizure disorder   . Hypertension   . CAD (coronary artery disease)     a. s/p 3-vessel CABG (12/2007) // 100% RCA stenosis with collaterals from left system. Severe bifurcation lesions of proxima CXA and OM. Moderate LAD disease - followed by Dr. Wyline Copas in [facility];  b. 11/2013 Myoview: Large fixed inf defect w/o ischemia, EF 35%.  . Chronic kidney disease (CKD), stage III (moderate)     a. BL SCr 1.5-1.6  . Renal artery stenosis   . Cancer     "I have cancer; right now it's not known what kind" (09/17/2014)  . Myocardial infarction 08/2007; 12/2007  . Anginal pain   . COPD (chronic obstructive pulmonary disease)   . Pneumonia     "I've had pneumonia once" (09/17/2014)  . Obstructive sleep apnea     "never told me to wear mask" (09/17/2014)  . History of blood transfusion "I've had quite a few"    "they don't know where it's coming from" (09/17/2014)  . History of hiatal hernia   . Seizures     "  I've had a few; don't know what kind they call it" (09/17/2014)  . DDD (degenerative disc disease), lumbar   . Arthritis     "ankles; left hip; wrists" (09/17/2014)  . Chronic lower back pain   . Depression   . Bipolar disorder   . Schizophrenia   . BPH (benign prostatic hyperplasia)    Past Surgical History  Procedure Laterality Date  . Apc  03/2010    To treat small bowel AVMs  . Esophagogastroduodenoscopy N/A 08/14/2012    Procedure: ESOPHAGOGASTRODUODENOSCOPY (EGD);  Surgeon: [name], MD;  Location: [facility] ENDOSCOPY;  Service: Endoscopy;  Laterality: N/A;  . Hot hemostasis N/A 08/14/2012    Procedure: HOT HEMOSTASIS (ARGON PLASMA COAGULATION/BICAP);  Surgeon: [name], MD;  Location: [facility] ENDOSCOPY;  Service: Endoscopy;  Laterality: N/A;  .  Esophagogastroduodenoscopy (egd) with propofol N/A 08/04/2013    Procedure: ESOPHAGOGASTRODUODENOSCOPY (EGD) WITH PROPOFOL;  Surgeon: [name], MD;  Location: [facility] ENDOSCOPY;  Service: Endoscopy;  Laterality: N/A;  . Esophagogastroduodenoscopy (egd) with propofol N/A 06/08/2014    Procedure: ESOPHAGOGASTRODUODENOSCOPY (EGD) WITH PROPOFOL;  Surgeon: Milus Banister, MD;  Location: [place];  Service: Endoscopy;  Laterality: N/A;  . Peripheral vascular catheterization N/A 09/07/2014    Procedure: Abdominal Aortogram;  Surgeon: Angelia Mould, MD;  Location: [place] CV LAB;  Service: Cardiovascular;  Laterality: N/A;  . Coronary artery bypass graft  12/2007    "CABG X3"  . Coronary angioplasty with stent placement  08/2007  . Enteroscopy N/A 10/16/2014    Procedure: ENTEROSCOPY;  Surgeon: [name], MD;  Location: [facility] ENDOSCOPY;  Service: Endoscopy;  Laterality: N/A;   Family History  Problem Relation Age of Onset  . Heart disease Mother     unknown type  . Heart disease Father 86    died of MI at 61yo  . Heart disease Paternal Grandfather 12    died of MI  . Heart disease    . Heart disease Brother 107   Social History  Substance Use Topics  . Smoking status: Former Smoker -- 0.25 packs/day for 10 years    Types: Cigarettes  . Smokeless tobacco: Never Used     Comment: "quit smoking cigarettes in 2009; smoked off and on since I was 13"  . Alcohol Use: No     Comment: 09/17/2014 "maybe 4 beers/month, 12 oz cans"    Review of Systems A complete 10 system review of systems was obtained and all systems are negative except as noted in the HPI and PMH.   Allergies  Motrin and Tylenol  Home Medications   Prior to Admission medications   Medication Sig Start Date End Date Taking? Authorizing Provider  albuterol (PROVENTIL HFA;VENTOLIN HFA) 108 (90 BASE) MCG/ACT inhaler Inhale 2 puffs into the lungs every 6 (six) hours as needed for wheezing or shortness of breath.  04/30/14  Yes Thurnell Lose, MD  amLODipine (NORVASC) 10 MG tablet Take 1 tablet (10 mg total) by mouth daily. 09/18/14  Yes Shanker Kristeen Mans, MD  aspirin EC 81 MG tablet Take 1 tablet (81 mg total) by mouth daily. RESUME AFTER 1 WEEK. 10/18/14  Yes Bonnielee Haff, MD  atorvastatin (LIPITOR) 80 MG tablet Take 1 tablet (80 mg total) by mouth daily at 6 PM. 10/07/14  Yes Charlynne Cousins, MD  budesonide-formoterol [facility]) 80-4.5 MCG/ACT inhaler Inhale 2 puffs into the lungs 2 (two) times daily. 09/08/14  Yes Ripudeep Krystal Eaton, MD  citalopram (CELEXA) 20 MG tablet Take  20 mg by mouth daily.   Yes Historical Provider, MD  diclofenac sodium (VOLTAREN) 1 % GEL Apply 4 g topically 4 (four) times daily. 09/22/14  Yes April Palumbo, MD  divalproex (DEPAKOTE ER) 500 MG 24 hr tablet Take 500 mg by mouth 2 (two) times daily. 05/29/14  Yes Historical Provider, MD  ferrous sulfate 325 (65 FE) MG tablet Take 325 mg by mouth 2 (two) times daily with a meal.    Yes Historical Provider, MD  hydrALAZINE (APRESOLINE) 100 MG tablet Take 100 mg by mouth 3 (three) times daily. 05/29/14  Yes Historical Provider, MD  HYDROcodone-acetaminophen (NORCO/VICODIN) 5-325 MG per tablet Take 1-2 tablets by mouth every 6 (six) hours as needed for moderate pain. 10/18/14  Yes Bonnielee Haff, MD  isosorbide mononitrate (IMDUR) 30 MG 24 hr tablet Take 2 tablets (60 mg total) by mouth daily. 07/26/14  Yes Modena Jansky, MD  metolazone (ZAROXOLYN) 5 MG tablet Take 1 tablet (5 mg total) by mouth as needed (for weight gain of 2 lbs in 2-3 days). 10/18/14  Yes Bonnielee Haff, MD  pantoprazole (PROTONIX) 40 MG tablet Take 40 mg by mouth daily.    Yes Historical Provider, MD  potassium chloride SA (KLOR-CON M20) 20 MEQ tablet Take 20 mEq by mouth daily. 01/17/10  Yes Historical Provider, MD  torsemide (DEMADEX) 20 MG tablet Take 2 tablets (40 mg total) by mouth daily. 10/18/14  Yes Bonnielee Haff, MD  nitroGLYCERIN (NITROSTAT) 0.4 MG SL tablet  Place 1 tablet (0.4 mg total) under the tongue every 5 (five) minutes as needed for chest pain (upto max 3 doses at one time.). Patient not taking: Reported on 10/15/2014 07/26/14   Modena Jansky, MD  phenytoin (DILANTIN) 200 MG ER capsule Take 1 capsule (200 mg total) by mouth 2 (two) times daily. 10/31/14   Janece Canterbury, MD   BP 177/115 mmHg  Pulse 97  Temp(Src) 99.6 F (37.6 C) (Oral)  Resp 20  SpO2 97%  Physical Exam  Constitutional: He is oriented to person, place, and time. He appears well-developed and well-nourished.  HENT:  Head: Normocephalic and atraumatic.  Eyes: Conjunctivae are normal. Pupils are equal, round, and reactive to light.  Neck: Normal range of motion. No JVD present.  distended neck vessels   Cardiovascular: Normal rate and regular rhythm.   Murmur heard. Pulses:      Radial pulses are 2+ on the right side, and 2+ on the left side.  Pulmonary/Chest: Effort normal. No respiratory distress.  Abdominal: Soft. Bowel sounds are normal.  Upper quadrant tenderness, worst in the epigastrium  Musculoskeletal:  No pitting edema to bilateral lower extremities; RLE, unilateral swelling  Neurological: He is alert and oriented to person, place, and time. Coordination normal.  Skin: Skin is warm and dry. No rash noted. He is not diaphoretic. No erythema.  Psychiatric: He has a normal mood and affect.  Nursing note and vitals reviewed.   ED Course  Procedures (including critical care time) DIAGNOSTIC STUDIES: Oxygen Saturation is 97% on RA, normal by my interpretation.    COORDINATION OF CARE: 12:31 AM-Discussed treatment plan which includes pain medication with pt at bedside and pt agreed to plan.   3:25 AM- pt still in pain, troponin positive. Will admit.  Labs Review Labs Reviewed  CBC WITH DIFFERENTIAL/PLATELET - Abnormal; Notable for the following:    RBC 3.29 (*)    Hemoglobin 9.0 (*)    HCT 29.1 (*)    RDW 16.8 (*)  All other components within  normal limits  TROPONIN I - Abnormal; Notable for the following:    Troponin I 0.07 (*)    All other components within normal limits  URINE RAPID DRUG SCREEN, HOSP PERFORMED - Abnormal; Notable for the following:    Cocaine POSITIVE (*)    All other components within normal limits  URINALYSIS, ROUTINE W REFLEX MICROSCOPIC (NOT AT [facility]) - Abnormal; Notable for the following:    Color, Urine AMBER (*)    Bilirubin Urine SMALL (*)    Protein, ur >300 (*)    Urobilinogen, UA 2.0 (*)    Leukocytes, UA SMALL (*)    All other components within normal limits  COMPREHENSIVE METABOLIC PANEL - Abnormal; Notable for the following:    CO2 19 (*)    BUN 24 (*)    Creatinine, Ser 1.38 (*)    Calcium 8.8 (*)    Albumin 3.3 (*)    ALT 15 (*)    GFR calc non Af Amer 50 (*)    GFR calc Af Amer 58 (*)    All other components within normal limits  URINE MICROSCOPIC-ADD ON - Abnormal; Notable for the following:    Bacteria, UA FEW (*)    Casts HYALINE CASTS (*)    All other components within normal limits  TROPONIN I - Abnormal; Notable for the following:    Troponin I 0.07 (*)    All other components within normal limits  TROPONIN I - Abnormal; Notable for the following:    Troponin I 0.06 (*)    All other components within normal limits  TROPONIN I - Abnormal; Notable for the following:    Troponin I 0.06 (*)    All other components within normal limits  BASIC METABOLIC PANEL - Abnormal; Notable for the following:    CO2 19 (*)    BUN 25 (*)    Creatinine, Ser 1.55 (*)    Calcium 8.8 (*)    GFR calc non Af Amer 44 (*)    GFR calc Af Amer 51 (*)    All other components within normal limits  HEPATIC FUNCTION PANEL - Abnormal; Notable for the following:    Albumin 3.1 (*)    ALT 13 (*)    All other components within normal limits  CBC WITH DIFFERENTIAL/PLATELET - Abnormal; Notable for the following:    RBC 3.22 (*)    Hemoglobin 8.9 (*)    HCT 28.6 (*)    RDW 16.7 (*)    Lymphs Abs 0.6  (*)    All other components within normal limits  PHENYTOIN LEVEL, TOTAL - Abnormal; Notable for the following:    Phenytoin Lvl 2.5 (*)    All other components within normal limits  VALPROIC ACID LEVEL - Abnormal; Notable for the following:    Valproic Acid Lvl 14 (*)    All other components within normal limits  BASIC METABOLIC PANEL - Abnormal; Notable for the following:    Potassium 3.4 (*)    CO2 21 (*)    Glucose, Bld 110 (*)    BUN 30 (*)    Creatinine, Ser 1.77 (*)    GFR calc non Af Amer 37 (*)    GFR calc Af Amer 43 (*)    All other components within normal limits  CBC - Abnormal; Notable for the following:    RBC 3.42 (*)    Hemoglobin 9.2 (*)    HCT 29.6 (*)    RDW  16.8 (*)    All other components within normal limits  BASIC METABOLIC PANEL - Abnormal; Notable for the following:    Sodium 133 (*)    Potassium 3.4 (*)    Chloride 96 (*)    BUN 35 (*)    Creatinine, Ser 1.75 (*)    GFR calc non Af Amer 38 (*)    GFR calc Af Amer 44 (*)    All other components within normal limits  CBC - Abnormal; Notable for the following:    RBC 3.75 (*)    Hemoglobin 9.9 (*)    HCT 31.9 (*)    RDW 16.7 (*)    Platelets 436 (*)    All other components within normal limits  LIPASE, BLOOD  LIPASE, BLOOD  MAGNESIUM   Imaging Review No results found.   EKG Interpretation   Date/Time:  Wednesday October 28 2014 23:50:09 EDT Ventricular Rate:  88 PR Interval:  168 QRS Duration: 116 QT Interval:  370 QTC Calculation: 448 R Axis:   70 Text Interpretation:  Sinus rhythm Ventricular premature complex  Nonspecific intraventricular conduction delay Probable inferior infarct,  age indeterminate No acute changes Confirmed by Kathrynn Humble, MD, Thelma Comp  [phone]) on 10/29/2014 1:27:25 AM      MDM   Final diagnoses:  Chest pain  NSTEMI (non-ST elevated myocardial infarction)  Elevated troponin    I personally performed the services described in this documentation, which was  scribed in my presence. The recorded information has been reviewed and is accurate.  Pt with chest pain and multiple other complains. His chest pain is L sided. He admits to active cocaine use. Also has very slight troponin elevation, and has hx of cad. He has had higher trop in the recent past, when he had come in with acute GI bleed. Will give nitro paste, no anticoagulation in the setting of recent gi bleed. Aspirin given.  CRITICAL CARE Performed by: Varney Biles   Total critical care time: 43 minutes  Critical care time was exclusive of separately billable procedures and treating other patients.  Critical care was necessary to treat or prevent imminent or life-threatening deterioration.  Critical care was time spent personally by me on the following activities: development of treatment plan with patient and/or surrogate as well as nursing, discussions with consultants, evaluation of patient's response to treatment, examination of patient, obtaining history from patient or surrogate, ordering and performing treatments and interventions, ordering and review of laboratory studies, ordering and review of radiographic studies, pulse oximetry and re-evaluation of patient's condition.    Varney Biles, MD 11/01/14 2252

## 2014-10-28 NOTE — ED Notes (Signed)
Per EMS, patient is coming from home original complaints of fever and chills, but then later reports chest pain. 324mg  of aspirin given, 0.4mg  of nitro given. 18g in right AC. 12 lead unremarkable, hx of cocaine usage. VS: bp 149/122, 86 pulse, rr 18, 98% RA.

## 2014-10-29 ENCOUNTER — Emergency Department (HOSPITAL_COMMUNITY): Payer: Medicare Other

## 2014-10-29 ENCOUNTER — Encounter (HOSPITAL_COMMUNITY): Payer: Self-pay | Admitting: Internal Medicine

## 2014-10-29 DIAGNOSIS — R7989 Other specified abnormal findings of blood chemistry: Secondary | ICD-10-CM | POA: Diagnosis present

## 2014-10-29 DIAGNOSIS — I5032 Chronic diastolic (congestive) heart failure: Secondary | ICD-10-CM | POA: Diagnosis present

## 2014-10-29 DIAGNOSIS — R079 Chest pain, unspecified: Secondary | ICD-10-CM | POA: Diagnosis not present

## 2014-10-29 DIAGNOSIS — Z79891 Long term (current) use of opiate analgesic: Secondary | ICD-10-CM | POA: Diagnosis not present

## 2014-10-29 DIAGNOSIS — I2510 Atherosclerotic heart disease of native coronary artery without angina pectoris: Secondary | ICD-10-CM | POA: Diagnosis present

## 2014-10-29 DIAGNOSIS — Z951 Presence of aortocoronary bypass graft: Secondary | ICD-10-CM | POA: Diagnosis not present

## 2014-10-29 DIAGNOSIS — E876 Hypokalemia: Secondary | ICD-10-CM | POA: Diagnosis present

## 2014-10-29 DIAGNOSIS — Z9119 Patient's noncompliance with other medical treatment and regimen: Secondary | ICD-10-CM | POA: Diagnosis present

## 2014-10-29 DIAGNOSIS — Z883 Allergy status to other anti-infective agents status: Secondary | ICD-10-CM | POA: Diagnosis not present

## 2014-10-29 DIAGNOSIS — G40909 Epilepsy, unspecified, not intractable, without status epilepticus: Secondary | ICD-10-CM | POA: Diagnosis present

## 2014-10-29 DIAGNOSIS — F329 Major depressive disorder, single episode, unspecified: Secondary | ICD-10-CM | POA: Diagnosis present

## 2014-10-29 DIAGNOSIS — I701 Atherosclerosis of renal artery: Secondary | ICD-10-CM | POA: Diagnosis present

## 2014-10-29 DIAGNOSIS — F1410 Cocaine abuse, uncomplicated: Secondary | ICD-10-CM

## 2014-10-29 DIAGNOSIS — N179 Acute kidney failure, unspecified: Secondary | ICD-10-CM | POA: Diagnosis present

## 2014-10-29 DIAGNOSIS — I739 Peripheral vascular disease, unspecified: Secondary | ICD-10-CM | POA: Diagnosis present

## 2014-10-29 DIAGNOSIS — N183 Chronic kidney disease, stage 3 (moderate): Secondary | ICD-10-CM | POA: Diagnosis present

## 2014-10-29 DIAGNOSIS — Z79899 Other long term (current) drug therapy: Secondary | ICD-10-CM | POA: Diagnosis not present

## 2014-10-29 DIAGNOSIS — Z7982 Long term (current) use of aspirin: Secondary | ICD-10-CM | POA: Diagnosis not present

## 2014-10-29 DIAGNOSIS — R112 Nausea with vomiting, unspecified: Secondary | ICD-10-CM | POA: Diagnosis not present

## 2014-10-29 DIAGNOSIS — Z8249 Family history of ischemic heart disease and other diseases of the circulatory system: Secondary | ICD-10-CM | POA: Diagnosis not present

## 2014-10-29 DIAGNOSIS — I252 Old myocardial infarction: Secondary | ICD-10-CM | POA: Diagnosis not present

## 2014-10-29 DIAGNOSIS — J449 Chronic obstructive pulmonary disease, unspecified: Secondary | ICD-10-CM | POA: Diagnosis present

## 2014-10-29 DIAGNOSIS — Z87891 Personal history of nicotine dependence: Secondary | ICD-10-CM | POA: Diagnosis not present

## 2014-10-29 DIAGNOSIS — Q2733 Arteriovenous malformation of digestive system vessel: Secondary | ICD-10-CM | POA: Diagnosis not present

## 2014-10-29 DIAGNOSIS — E785 Hyperlipidemia, unspecified: Secondary | ICD-10-CM | POA: Diagnosis present

## 2014-10-29 DIAGNOSIS — I209 Angina pectoris, unspecified: Secondary | ICD-10-CM | POA: Diagnosis present

## 2014-10-29 DIAGNOSIS — F209 Schizophrenia, unspecified: Secondary | ICD-10-CM | POA: Diagnosis present

## 2014-10-29 DIAGNOSIS — Z888 Allergy status to other drugs, medicaments and biological substances status: Secondary | ICD-10-CM | POA: Diagnosis not present

## 2014-10-29 DIAGNOSIS — G4733 Obstructive sleep apnea (adult) (pediatric): Secondary | ICD-10-CM | POA: Diagnosis present

## 2014-10-29 DIAGNOSIS — M5136 Other intervertebral disc degeneration, lumbar region: Secondary | ICD-10-CM | POA: Diagnosis present

## 2014-10-29 DIAGNOSIS — R778 Other specified abnormalities of plasma proteins: Secondary | ICD-10-CM | POA: Insufficient documentation

## 2014-10-29 DIAGNOSIS — T502X5A Adverse effect of carbonic-anhydrase inhibitors, benzothiadiazides and other diuretics, initial encounter: Secondary | ICD-10-CM | POA: Diagnosis present

## 2014-10-29 DIAGNOSIS — I255 Ischemic cardiomyopathy: Secondary | ICD-10-CM | POA: Diagnosis present

## 2014-10-29 LAB — HEPATIC FUNCTION PANEL
ALT: 13 U/L — ABNORMAL LOW (ref 17–63)
AST: 33 U/L (ref 15–41)
Albumin: 3.1 g/dL — ABNORMAL LOW (ref 3.5–5.0)
Alkaline Phosphatase: 65 U/L (ref 38–126)
BILIRUBIN TOTAL: 0.9 mg/dL (ref 0.3–1.2)
Bilirubin, Direct: 0.4 mg/dL (ref 0.1–0.5)
Indirect Bilirubin: 0.5 mg/dL (ref 0.3–0.9)
Total Protein: 7.3 g/dL (ref 6.5–8.1)

## 2014-10-29 LAB — RAPID URINE DRUG SCREEN, HOSP PERFORMED
Amphetamines: NOT DETECTED
Barbiturates: NOT DETECTED
Benzodiazepines: NOT DETECTED
Cocaine: POSITIVE — AB
Opiates: NOT DETECTED
Tetrahydrocannabinol: NOT DETECTED

## 2014-10-29 LAB — CBC WITH DIFFERENTIAL/PLATELET
BASOS ABS: 0 10*3/uL (ref 0.0–0.1)
BASOS PCT: 1 % (ref 0–1)
Basophils Absolute: 0 10*3/uL (ref 0.0–0.1)
Basophils Relative: 1 % (ref 0–1)
EOS PCT: 2 % (ref 0–5)
Eosinophils Absolute: 0.1 10*3/uL (ref 0.0–0.7)
Eosinophils Absolute: 0.2 10*3/uL (ref 0.0–0.7)
Eosinophils Relative: 4 % (ref 0–5)
HCT: 28.6 % — ABNORMAL LOW (ref 39.0–52.0)
HEMATOCRIT: 29.1 % — AB (ref 39.0–52.0)
HEMOGLOBIN: 9 g/dL — AB (ref 13.0–17.0)
Hemoglobin: 8.9 g/dL — ABNORMAL LOW (ref 13.0–17.0)
LYMPHS ABS: 0.7 10*3/uL (ref 0.7–4.0)
LYMPHS PCT: 13 % (ref 12–46)
Lymphocytes Relative: 15 % (ref 12–46)
Lymphs Abs: 0.6 10*3/uL — ABNORMAL LOW (ref 0.7–4.0)
MCH: 27.4 pg (ref 26.0–34.0)
MCH: 27.6 pg (ref 26.0–34.0)
MCHC: 30.9 g/dL (ref 30.0–36.0)
MCHC: 31.1 g/dL (ref 30.0–36.0)
MCV: 88.4 fL (ref 78.0–100.0)
MCV: 88.8 fL (ref 78.0–100.0)
MONO ABS: 0.5 10*3/uL (ref 0.1–1.0)
MONOS PCT: 9 % (ref 3–12)
Monocytes Absolute: 0.4 10*3/uL (ref 0.1–1.0)
Monocytes Relative: 9 % (ref 3–12)
Neutro Abs: 3.1 10*3/uL (ref 1.7–7.7)
Neutro Abs: 4.1 10*3/uL (ref 1.7–7.7)
Neutrophils Relative %: 71 % (ref 43–77)
Neutrophils Relative %: 75 % (ref 43–77)
Platelets: 314 10*3/uL (ref 150–400)
Platelets: 318 10*3/uL (ref 150–400)
RBC: 3.22 MIL/uL — ABNORMAL LOW (ref 4.22–5.81)
RBC: 3.29 MIL/uL — AB (ref 4.22–5.81)
RDW: 16.7 % — ABNORMAL HIGH (ref 11.5–15.5)
RDW: 16.8 % — ABNORMAL HIGH (ref 11.5–15.5)
WBC: 4.3 10*3/uL (ref 4.0–10.5)
WBC: 5.4 10*3/uL (ref 4.0–10.5)

## 2014-10-29 LAB — URINE MICROSCOPIC-ADD ON

## 2014-10-29 LAB — TROPONIN I
Troponin I: 0.07 ng/mL — ABNORMAL HIGH (ref <0.031)
Troponin I: 0.07 ng/mL — ABNORMAL HIGH (ref <0.031)
Troponin I: 0.06 ng/mL — ABNORMAL HIGH (ref <0.031)
Troponin I: 0.06 ng/mL — ABNORMAL HIGH (ref <0.031)

## 2014-10-29 LAB — BASIC METABOLIC PANEL
Anion gap: 12 (ref 5–15)
BUN: 25 mg/dL — ABNORMAL HIGH (ref 6–20)
CALCIUM: 8.8 mg/dL — AB (ref 8.9–10.3)
CO2: 19 mmol/L — ABNORMAL LOW (ref 22–32)
Chloride: 106 mmol/L (ref 101–111)
Creatinine, Ser: 1.55 mg/dL — ABNORMAL HIGH (ref 0.61–1.24)
GFR calc Af Amer: 51 mL/min — ABNORMAL LOW (ref >60)
GFR, EST NON AFRICAN AMERICAN: 44 mL/min — AB (ref >60)
Glucose, Bld: 85 mg/dL (ref 65–99)
Potassium: 4 mmol/L (ref 3.5–5.1)
Sodium: 137 mmol/L (ref 135–145)

## 2014-10-29 LAB — URINALYSIS, ROUTINE W REFLEX MICROSCOPIC
GLUCOSE, UA: NEGATIVE mg/dL
HGB URINE DIPSTICK: NEGATIVE
KETONES UR: NEGATIVE mg/dL
Nitrite: NEGATIVE
PH: 5 (ref 5.0–8.0)
Protein, ur: >300 mg/dL — AB
Specific Gravity, Urine: 1.026 (ref 1.005–1.030)
Urobilinogen, UA: 2 mg/dL — ABNORMAL HIGH (ref 0.0–1.0)

## 2014-10-29 LAB — COMPREHENSIVE METABOLIC PANEL
ALT: 15 U/L — ABNORMAL LOW (ref 17–63)
AST: 34 U/L (ref 15–41)
Albumin: 3.3 g/dL — ABNORMAL LOW (ref 3.5–5.0)
Alkaline Phosphatase: 70 U/L (ref 38–126)
Anion gap: 11 (ref 5–15)
BILIRUBIN TOTAL: 0.8 mg/dL (ref 0.3–1.2)
BUN: 24 mg/dL — AB (ref 6–20)
CALCIUM: 8.8 mg/dL — AB (ref 8.9–10.3)
CO2: 19 mmol/L — ABNORMAL LOW (ref 22–32)
Chloride: 107 mmol/L (ref 101–111)
Creatinine, Ser: 1.38 mg/dL — ABNORMAL HIGH (ref 0.61–1.24)
GFR calc Af Amer: 58 mL/min — ABNORMAL LOW (ref >60)
GFR calc non Af Amer: 50 mL/min — ABNORMAL LOW (ref >60)
Glucose, Bld: 93 mg/dL (ref 65–99)
Potassium: 4 mmol/L (ref 3.5–5.1)
Sodium: 137 mmol/L (ref 135–145)
TOTAL PROTEIN: 7.3 g/dL (ref 6.5–8.1)

## 2014-10-29 LAB — LIPASE, BLOOD
LIPASE: 25 U/L (ref 22–51)
Lipase: 26 U/L (ref 22–51)

## 2014-10-29 LAB — VALPROIC ACID LEVEL: VALPROIC ACID LVL: 14 ug/mL — AB (ref 50.0–100.0)

## 2014-10-29 LAB — MAGNESIUM: MAGNESIUM: 1.7 mg/dL (ref 1.7–2.4)

## 2014-10-29 LAB — PHENYTOIN LEVEL, TOTAL: Phenytoin Lvl: 2.5 ug/mL — ABNORMAL LOW (ref 10.0–20.0)

## 2014-10-29 MED ORDER — BUDESONIDE-FORMOTEROL FUMARATE 80-4.5 MCG/ACT IN AERO
2.0000 | INHALATION_SPRAY | Freq: Two times a day (BID) | RESPIRATORY_TRACT | Status: DC
  Administered 2014-10-29 – 2014-10-31 (×4): 2 via RESPIRATORY_TRACT
  Filled 2014-10-29 (×2): qty 6.9

## 2014-10-29 MED ORDER — DIVALPROEX SODIUM ER 500 MG PO TB24
500.0000 mg | ORAL_TABLET | Freq: Two times a day (BID) | ORAL | Status: DC
  Administered 2014-10-29 – 2014-10-31 (×5): 500 mg via ORAL
  Filled 2014-10-29 (×8): qty 1

## 2014-10-29 MED ORDER — HYDROCODONE-ACETAMINOPHEN 5-325 MG PO TABS
1.0000 | ORAL_TABLET | Freq: Four times a day (QID) | ORAL | Status: DC | PRN
  Administered 2014-10-29 – 2014-10-31 (×7): 2 via ORAL
  Filled 2014-10-29 (×7): qty 2

## 2014-10-29 MED ORDER — POTASSIUM CHLORIDE CRYS ER 20 MEQ PO TBCR
20.0000 meq | EXTENDED_RELEASE_TABLET | Freq: Every day | ORAL | Status: DC
  Administered 2014-10-29 – 2014-10-31 (×3): 20 meq via ORAL
  Filled 2014-10-29 (×3): qty 1

## 2014-10-29 MED ORDER — FERROUS SULFATE 325 (65 FE) MG PO TABS
325.0000 mg | ORAL_TABLET | Freq: Two times a day (BID) | ORAL | Status: DC
Start: 2014-10-29 — End: 2014-10-31
  Administered 2014-10-29 – 2014-10-31 (×4): 325 mg via ORAL
  Filled 2014-10-29 (×5): qty 1

## 2014-10-29 MED ORDER — ASPIRIN 81 MG PO CHEW
324.0000 mg | CHEWABLE_TABLET | Freq: Once | ORAL | Status: AC
  Administered 2014-10-29: 324 mg via ORAL
  Filled 2014-10-29: qty 4

## 2014-10-29 MED ORDER — FUROSEMIDE 10 MG/ML IJ SOLN
40.0000 mg | Freq: Two times a day (BID) | INTRAMUSCULAR | Status: DC
  Administered 2014-10-29: 40 mg via INTRAVENOUS
  Filled 2014-10-29: qty 4

## 2014-10-29 MED ORDER — METOLAZONE 5 MG PO TABS: 5.0000 mg | ORAL_TABLET | ORAL | Status: DC | PRN

## 2014-10-29 MED ORDER — PHENYTOIN SODIUM EXTENDED 100 MG PO CAPS: 200.0000 mg | ORAL_CAPSULE | Freq: Every day | ORAL | Status: DC

## 2014-10-29 MED ORDER — ENOXAPARIN SODIUM 40 MG/0.4ML ~~LOC~~ SOLN
40.0000 mg | SUBCUTANEOUS | Status: DC
Start: 2014-10-29 — End: 2014-10-31
  Administered 2014-10-29: 40 mg via SUBCUTANEOUS
  Filled 2014-10-29 (×2): qty 0.4

## 2014-10-29 MED ORDER — ISOSORBIDE MONONITRATE ER 60 MG PO TB24
60.0000 mg | ORAL_TABLET | Freq: Every day | ORAL | Status: DC
Start: 2014-10-29 — End: 2014-10-31
  Administered 2014-10-29 – 2014-10-31 (×3): 60 mg via ORAL
  Filled 2014-10-29 (×3): qty 1

## 2014-10-29 MED ORDER — PHENYTOIN SODIUM EXTENDED 100 MG PO CAPS
200.0000 mg | ORAL_CAPSULE | Freq: Two times a day (BID) | ORAL | Status: DC
  Administered 2014-10-29 – 2014-10-31 (×5): 200 mg via ORAL
  Filled 2014-10-29 (×5): qty 2

## 2014-10-29 MED ORDER — LORAZEPAM 2 MG/ML IJ SOLN
1.0000 mg | Freq: Once | INTRAMUSCULAR | Status: AC
  Administered 2014-10-29: 1 mg via INTRAVENOUS
  Filled 2014-10-29: qty 1

## 2014-10-29 MED ORDER — METOLAZONE 5 MG PO TABS
5.0000 mg | ORAL_TABLET | Freq: Once | ORAL | Status: AC
  Administered 2014-10-29: 5 mg via ORAL
  Filled 2014-10-29: qty 1

## 2014-10-29 MED ORDER — FAMOTIDINE IN NACL 20-0.9 MG/50ML-% IV SOLN
20.0000 mg | Freq: Once | INTRAVENOUS | Status: AC
  Administered 2014-10-29: 20 mg via INTRAVENOUS
  Filled 2014-10-29: qty 50

## 2014-10-29 MED ORDER — SODIUM CHLORIDE 0.9 % IJ SOLN
3.0000 mL | Freq: Two times a day (BID) | INTRAMUSCULAR | Status: DC
  Administered 2014-10-29 – 2014-10-31 (×5): 3 mL via INTRAVENOUS

## 2014-10-29 MED ORDER — SODIUM CHLORIDE 0.9 % IV BOLUS (SEPSIS): 500.0000 mL | Freq: Once | INTRAVENOUS | Status: DC

## 2014-10-29 MED ORDER — ONDANSETRON HCL 4 MG/2ML IJ SOLN: 4.0000 mg | Freq: Four times a day (QID) | INTRAMUSCULAR | Status: DC | PRN

## 2014-10-29 MED ORDER — NITROGLYCERIN 2 % TD OINT
1.0000 [in_us] | TOPICAL_OINTMENT | Freq: Four times a day (QID) | TRANSDERMAL | Status: DC
  Administered 2014-10-29: 1 [in_us] via TOPICAL

## 2014-10-29 MED ORDER — AMLODIPINE BESYLATE 10 MG PO TABS
10.0000 mg | ORAL_TABLET | Freq: Every day | ORAL | Status: DC
Start: 2014-10-29 — End: 2014-10-31
  Administered 2014-10-29 – 2014-10-31 (×3): 10 mg via ORAL
  Filled 2014-10-29 (×3): qty 1

## 2014-10-29 MED ORDER — FENTANYL CITRATE (PF) 100 MCG/2ML IJ SOLN
50.0000 ug | Freq: Once | INTRAMUSCULAR | Status: AC
  Administered 2014-10-29: 50 ug via INTRAVENOUS
  Filled 2014-10-29: qty 2

## 2014-10-29 MED ORDER — CLONIDINE HCL 0.3 MG PO TABS
0.3000 mg | ORAL_TABLET | Freq: Two times a day (BID) | ORAL | Status: DC
  Administered 2014-10-29: 0.3 mg via ORAL
  Filled 2014-10-29: qty 1

## 2014-10-29 MED ORDER — CITALOPRAM HYDROBROMIDE 20 MG PO TABS
20.0000 mg | ORAL_TABLET | Freq: Every day | ORAL | Status: DC
Start: 2014-10-29 — End: 2014-10-31
  Administered 2014-10-29 – 2014-10-31 (×3): 20 mg via ORAL
  Filled 2014-10-29 (×3): qty 1

## 2014-10-29 MED ORDER — ASPIRIN EC 81 MG PO TBEC
81.0000 mg | DELAYED_RELEASE_TABLET | Freq: Every day | ORAL | Status: DC
  Administered 2014-10-29 – 2014-10-31 (×3): 81 mg via ORAL
  Filled 2014-10-29 (×3): qty 1

## 2014-10-29 MED ORDER — ONDANSETRON HCL 4 MG PO TABS: 4.0000 mg | ORAL_TABLET | Freq: Four times a day (QID) | ORAL | Status: DC | PRN

## 2014-10-29 MED ORDER — ATORVASTATIN CALCIUM 80 MG PO TABS
80.0000 mg | ORAL_TABLET | Freq: Every day | ORAL | Status: DC
Start: 2014-10-29 — End: 2014-10-31
  Administered 2014-10-29 – 2014-10-30 (×2): 80 mg via ORAL
  Filled 2014-10-29 (×2): qty 1

## 2014-10-29 MED ORDER — HYDRALAZINE HCL 50 MG PO TABS
100.0000 mg | ORAL_TABLET | Freq: Three times a day (TID) | ORAL | Status: DC
  Administered 2014-10-29 – 2014-10-31 (×7): 100 mg via ORAL
  Filled 2014-10-29 (×8): qty 2

## 2014-10-29 MED ORDER — PANTOPRAZOLE SODIUM 40 MG PO TBEC
40.0000 mg | DELAYED_RELEASE_TABLET | Freq: Every day | ORAL | Status: DC
  Administered 2014-10-29 – 2014-10-31 (×3): 40 mg via ORAL
  Filled 2014-10-29 (×3): qty 1

## 2014-10-29 MED ORDER — LISINOPRIL 20 MG PO TABS
20.0000 mg | ORAL_TABLET | Freq: Every day | ORAL | Status: DC
  Administered 2014-10-29: 20 mg via ORAL
  Filled 2014-10-29: qty 1

## 2014-10-29 MED ORDER — ALBUTEROL SULFATE (2.5 MG/3ML) 0.083% IN NEBU: 3.0000 mL | INHALATION_SOLUTION | Freq: Four times a day (QID) | RESPIRATORY_TRACT | Status: DC | PRN

## 2014-10-29 MED ORDER — TORSEMIDE 20 MG PO TABS
40.0000 mg | ORAL_TABLET | Freq: Every day | ORAL | Status: DC
  Administered 2014-10-29: 40 mg via ORAL
  Filled 2014-10-29: qty 2

## 2014-10-29 MED ORDER — NITROGLYCERIN 0.4 MG SL SUBL
0.4000 mg | SUBLINGUAL_TABLET | SUBLINGUAL | Status: DC | PRN
  Administered 2014-10-29: 0.4 mg via SUBLINGUAL
  Filled 2014-10-29: qty 1

## 2014-10-29 NOTE — ED Notes (Signed)
Patient called out for "not able to breathe, o2 sat 97% on room air. Patient is able to speak in clear sentences as evidenced by explaining black device on right ankle. "i'm on curfew, since I missed a court date". 2L via nasal cannula applied for comfort. Lung sounds clear. No acute distress noted.

## 2014-10-29 NOTE — ED Notes (Signed)
Patient transported to X-ray 

## 2014-10-29 NOTE — ED Notes (Signed)
Admitting MD at bedside.

## 2014-10-29 NOTE — ED Notes (Signed)
Updated Dr. Mariane Masters on patient's complaint of shortness of breath, o2 applied with no acute findings. MD acknowledges, no new orders.

## 2014-10-29 NOTE — ED Notes (Signed)
Pt gone to X-Ray. Will obtain vitals when pt returns.

## 2014-10-29 NOTE — ED Notes (Signed)
MD at bedside. 

## 2014-10-29 NOTE — H&P (Signed)
[facility] History and Physical  Christerpher Clos CLE:[id number] DOB: Dec 06, 1945 DOA: 10/28/2014  Referring physician: Dr.Ankit. PCP: No PCP Per Patient  Specialists: Cardiologist.  Chief Complaint: Chest pain.  HPI: Charles [name] is a 69 y.o. male with history of CAD status post CABG, cocaine abuse, seizure, GI bleed with AVMs, peripheral vascular disease, diastolic CHF presents to the ER because of chest pain and abdominal pain. He states he has been having these symptoms since yesterday at 2:00 PM. Patient also had 3 episodes of nausea vomiting. Denies any blood in the vomitus. In the ER patient's troponin is found to be mildly elevated with an EKG showing nonspecific findings. Dr. Radford Pax cardiologist on-call was consulted by the ER physician at this time Dr. Radford Pax is advised to cycle cardiac markers and if there is any further worsening to consult cardiology. Patient still has chest pain and has been placed on nitroglycerin patch. Patient was given 1 dose of Ativan following which patient became drowsy. Patient's urine drug screen is positive for cocaine. Patient's abdomen appears benign.   Review of Systems: As presented in the history of presenting illness, rest negative.  Past Medical History  Diagnosis Date  . Iron deficiency anemia   . Chronic combined systolic and diastolic CHF, NYHA class 2     a. 11/2013 Echo: EF 40-45%, basl-mid inflat AK, Gr 2 DD.  [name] Hyperlipidemia LDL goal <70   . Ischemic cardiomyopathy     a. 11/2013 Echo: EF 40-45%, basl-mid inflat AK, Gr 2 DD, mild AI, mod dil LA/RA, mod reduced RV fxn, PASP 59mmHg.  . Moderate to severe pulmonary hypertension 03/2010    a. PA peak pressure of 76 mmHg (per 2D Echo 03/2010)  . Polysubstance abuse     a. cocaine, THC  . AV malformation of gastrointestinal tract     a. w/ h/o GIB.  [name] Family history of early CAD   . Peripheral vascular disease   . Seizure disorder   . Hypertension   . CAD (coronary  artery disease)     a. s/p 3-vessel CABG (12/2007) // 100% RCA stenosis with collaterals from left system. Severe bifurcation lesions of proxima CXA and OM. Moderate LAD disease - followed by Dr. Wyline Copas in [facility];  b. 11/2013 Myoview: Large fixed inf defect w/o ischemia, EF 35%.  . Chronic kidney disease (CKD), stage III (moderate)     a. BL SCr 1.5-1.6  . Renal artery stenosis   . Cancer     "I have cancer; right now it's not known what kind" (09/17/2014)  . Myocardial infarction 08/2007; 12/2007  . Anginal pain   . COPD (chronic obstructive pulmonary disease)   . Pneumonia     "I've had pneumonia once" (09/17/2014)  . Obstructive sleep apnea     "never told me to wear mask" (09/17/2014)  . History of blood transfusion "I've had quite a few"    "they don't know where it's coming from" (09/17/2014)  . History of hiatal hernia   . Seizures     "I've had a few; don't know what kind they call it" (09/17/2014)  . DDD (degenerative disc disease), lumbar   . Arthritis     "ankles; left hip; wrists" (09/17/2014)  . Chronic lower back pain   . Depression   . Bipolar disorder   . Schizophrenia    Past Surgical History  Procedure Laterality Date  . Apc  03/2010    To treat small bowel AVMs  .  Esophagogastroduodenoscopy N/A 08/14/2012    Procedure: ESOPHAGOGASTRODUODENOSCOPY (EGD);  Surgeon: [name], MD;  Location: [facility] ENDOSCOPY;  Service: Endoscopy;  Laterality: N/A;  . Hot hemostasis N/A 08/14/2012    Procedure: HOT HEMOSTASIS (ARGON PLASMA COAGULATION/BICAP);  Surgeon: [name], MD;  Location: [facility] ENDOSCOPY;  Service: Endoscopy;  Laterality: N/A;  . Esophagogastroduodenoscopy (egd) with propofol N/A 08/04/2013    Procedure: ESOPHAGOGASTRODUODENOSCOPY (EGD) WITH PROPOFOL;  Surgeon: [name], MD;  Location: [facility] ENDOSCOPY;  Service: Endoscopy;  Laterality: N/A;  . Esophagogastroduodenoscopy (egd) with propofol N/A 06/08/2014    Procedure: ESOPHAGOGASTRODUODENOSCOPY (EGD) WITH PROPOFOL;   Surgeon: Milus Banister, MD;  Location: [place];  Service: Endoscopy;  Laterality: N/A;  . Peripheral vascular catheterization N/A 09/07/2014    Procedure: Abdominal Aortogram;  Surgeon: Angelia Mould, MD;  Location: [place] CV LAB;  Service: Cardiovascular;  Laterality: N/A;  . Coronary artery bypass graft  12/2007    "CABG X3"  . Coronary angioplasty with stent placement  08/2007  . Enteroscopy N/A 10/16/2014    Procedure: ENTEROSCOPY;  Surgeon: [name], MD;  Location: [facility] ENDOSCOPY;  Service: Endoscopy;  Laterality: N/A;   Social History:  reports that he has quit smoking. His smoking use included Cigarettes. He has a 2.5 pack-year smoking history. He has never used smokeless tobacco. He reports that he uses illicit drugs (Cocaine and Marijuana) about once per week. He reports that he does not drink alcohol. Where does patient live home. Can patient participate in ADLs? Yes.  Allergies  Allergen Reactions  . Motrin [Ibuprofen] Other (See Comments)    Affects kidneys  . Tylenol [Acetaminophen] Other (See Comments)    Affects kidneys    Family History:  Family History  Problem Relation Age of Onset  . Heart disease Mother     unknown type  . Heart disease Father 61    died of MI at 69yo  . Heart disease Paternal Grandfather 64    died of MI  . Heart disease    . Heart disease Brother 30      Prior to Admission medications   Medication Sig Start Date End Date Taking? Authorizing Provider  albuterol (PROVENTIL HFA;VENTOLIN HFA) 108 (90 BASE) MCG/ACT inhaler Inhale 2 puffs into the lungs every 6 (six) hours as needed for wheezing or shortness of breath. 04/30/14  Yes Thurnell Lose, MD  amLODipine (NORVASC) 10 MG tablet Take 1 tablet (10 mg total) by mouth daily. 09/18/14  Yes Shanker Kristeen Mans, MD  aspirin EC 81 MG tablet Take 1 tablet (81 mg total) by mouth daily. RESUME AFTER 1 WEEK. 10/18/14  Yes Bonnielee Haff, MD  atorvastatin (LIPITOR) 80 MG tablet Take  1 tablet (80 mg total) by mouth daily at 6 PM. 10/07/14  Yes Charlynne Cousins, MD  budesonide-formoterol [facility]) 80-4.5 MCG/ACT inhaler Inhale 2 puffs into the lungs 2 (two) times daily. 09/08/14  Yes Ripudeep Krystal Eaton, MD  citalopram (CELEXA) 20 MG tablet Take 20 mg by mouth daily.   Yes Historical Provider, MD  cloNIDine (CATAPRES) 0.3 MG tablet Take 1 tablet (0.3 mg total) by mouth 2 (two) times daily. 09/14/14  Yes Jolaine Artist, MD  diclofenac sodium (VOLTAREN) 1 % GEL Apply 4 g topically 4 (four) times daily. 09/22/14  Yes April Palumbo, MD  divalproex (DEPAKOTE ER) 500 MG 24 hr tablet Take 500 mg by mouth 2 (two) times daily. 05/29/14  Yes Historical Provider, MD  ferrous sulfate 325 (65 FE) MG tablet  Take 325 mg by mouth 2 (two) times daily with a meal.    Yes Historical Provider, MD  hydrALAZINE (APRESOLINE) 100 MG tablet Take 100 mg by mouth 3 (three) times daily. 05/29/14  Yes Historical Provider, MD  HYDROcodone-acetaminophen (NORCO/VICODIN) 5-325 MG per tablet Take 1-2 tablets by mouth every 6 (six) hours as needed for moderate pain. 10/18/14  Yes Bonnielee Haff, MD  isosorbide mononitrate (IMDUR) 30 MG 24 hr tablet Take 2 tablets (60 mg total) by mouth daily. 07/26/14  Yes Modena Jansky, MD  lisinopril (PRINIVIL,ZESTRIL) 20 MG tablet Take 20 mg by mouth daily. 10/01/14  Yes Historical Provider, MD  metolazone (ZAROXOLYN) 5 MG tablet Take 1 tablet (5 mg total) by mouth as needed (for weight gain of 2 lbs in 2-3 days). 10/18/14  Yes Bonnielee Haff, MD  pantoprazole (PROTONIX) 40 MG tablet Take 40 mg by mouth daily.    Yes Historical Provider, MD  phenytoin (DILANTIN) 100 MG ER capsule Take 200 mg by mouth at bedtime. 10/01/14  Yes Historical Provider, MD  phenytoin (DILANTIN) 200 MG ER capsule Take 100-200 mg by mouth 2 (two) times daily.  10/01/14  Yes Historical Provider, MD  potassium chloride SA (KLOR-CON M20) 20 MEQ tablet Take 20 mEq by mouth daily. 01/17/10  Yes Historical Provider,  MD  torsemide (DEMADEX) 20 MG tablet Take 2 tablets (40 mg total) by mouth daily. 10/18/14  Yes Bonnielee Haff, MD  nitroGLYCERIN (NITROSTAT) 0.4 MG SL tablet Place 1 tablet (0.4 mg total) under the tongue every 5 (five) minutes as needed for chest pain (upto max 3 doses at one time.). Patient not taking: Reported on 10/15/2014 07/26/14   Modena Jansky, MD    Physical Exam: Filed Vitals:   10/29/14 0300 10/29/14 0400 10/29/14 0500 10/29/14 0540  BP: 172/99 161/98 163/92 168/99  Pulse: 90 87 89   Temp:    97.8 F (36.6 C)  TempSrc:    Oral  Resp: 32 11  22  SpO2: 100% 99% 98% 100%     General:  Moderately built and nourished.  Eyes: Anicteric no pallor.  ENT: No discharge from the ears eyes nose and mouth.  Neck: No mass felt.  Cardiovascular: S1 and S2 heard.  Respiratory: No rhonchi or crepitations.  Abdomen: Soft nontender bowel sounds present.  Skin: No rash.  Musculoskeletal: No edema.  Psychiatric: Appears normal.  Neurologic: Alert awake oriented to time place and person. Moves all activities.  Labs on Admission:  Basic Metabolic Panel:  Recent Labs Lab 10/29/14 0050  NA 137  K 4.0  CL 107  CO2 19*  GLUCOSE 93  BUN 24*  CREATININE 1.38*  CALCIUM 8.8*   Liver Function Tests:  Recent Labs Lab 10/29/14 0050  AST 34  ALT 15*  ALKPHOS 70  BILITOT 0.8  PROT 7.3  ALBUMIN 3.3*    Recent Labs Lab 10/29/14 0050  LIPASE 25   No results for input(s): AMMONIA in the last 168 hours. CBC:  Recent Labs Lab 10/29/14 0050  WBC 5.4  NEUTROABS 4.1  HGB 9.0*  HCT 29.1*  MCV 88.4  PLT 318   Cardiac Enzymes:  Recent Labs Lab 10/29/14 0050  TROPONINI 0.07*    BNP (last 3 results)  Recent Labs  09/15/14 1430 10/05/14 2201 10/15/14 1632  BNP 3756.9* 748.5* 2506.0*    ProBNP (last 3 results)  Recent Labs  01/01/14 1107 02/07/14 0123 03/01/14 2250  PROBNP 3225.0* 2648.0* 7421.0*    CBG: No results  for input(s): GLUCAP in the  last 168 hours.  Radiological Exams on Admission: Dg Chest 2 View  10/29/2014   CLINICAL DATA:  One day history of left-sided chest pain  EXAM: CHEST  2 VIEW  COMPARISON:  October 27, 2014  FINDINGS: There is cardiomegaly with slight pulmonary venous hypertension. There is slight interstitial edema in the mid lower lung zones bilaterally. No airspace consolidation. No adenopathy. Patient is status post coronary artery bypass grafting.  IMPRESSION: Cardiomegaly with slight edema. Evidence of a degree of congestive heart failure. No airspace consolidation. Very little change from 2 days prior.   Electronically Signed   By: Lowella Grip III M.D.   On: 10/29/2014 02:01    EKG: Independently reviewed. Normal sinus rhythm with nonspecific ST-T changes.  Assessment/Plan Active Problems:   Seizure disorder   Chest pain   Cocaine abuse   Chronic diastolic heart failure   Nausea & vomiting   Elevated troponin   1. Chest pain with history of CAD status post CABG in the setting of cocaine abuse - cardiologist Dr. Radford Pax as advised to cycle cardiac markers at this time. Since patient has persistent chest pain patient has been placed on nitroglycerin patch. Patient is on antiplatelets agents. Cycle cardiac markers. Consult cardiology in a.m. 2. Nausea vomiting - abdomen appears benign. Closely follow. 3. History of GI bleeding with AVMs - closely follow CBC. Recently admitted for GI bleed. 4. Chronic diastolic CHF last EF measured was 45-50% in March 2016 - patient is on Diuretics which will be continued. If patient's creatinine is worsening and may have to decrease doses. 5. Renal failure probably acute - Patient's creatinine has recently increased. In addition patient also had nausea vomiting. If creatinine further worsened when patients diabetics and lisinopril may have to be held.  6. Hypertension - see #5. 7. Cocaine abuse - patient advised to quit the habit. 8. History of seizures continue  present medications. Check Dilantin and Depakote levels.  I have reviewed patient's old charts and labs. Personally viewed x-rays and EKG.   DVT ProphylaxisLovenox. Code Status: Full code. Familily Communication: Discussed with patient. Dispositinion Plan: OBS.   KAKRAKANDY,ARSHAD N. [facility] Pager [phone].  If 7PM-7AM, please contact night-coverage [email or web address] Password [email or web address] 10/29/2014, 5:42 AM

## 2014-10-29 NOTE — Consult Note (Addendum)
CONSULT NOTE  Date: 10/29/2014               Patient Name:  Charles [name] MRN: [id number]  DOB: [birth year] Age / Sex: 69 y.o., male        PCP: No PCP Per Patient Primary Cardiologist: [facility]            Referring Physician: Hal Hope              Reason for Consult: Chest pain            History of Present Illness: Patient is a 69 y.o. male with a PMHx of coronary artery disease, coronary artery bypass grafting, chronic diastolic congestive heart failure, hypertension, anemia-history of AV mouth relations, hyperlipidemia, and cocaine abuse., who was admitted to [facility] on 10/28/2014 for evaluation of chest pain in the setting of recent cocaine abuse.   Charles [name] has a long history of cocaine abuse. He has multiple admissions with positive urine drug screens. His history of coronary artery bypass grafting in 2009 in [facility]. Has a history of hypertension that is clearly exacerbated but by his cocaine abuse.  He had a similar admission several weeks ago.  He developed chest pain last night around 10 PM. The pain was fairly constant. As a tightness in his chest. He also complains of severe abdominal pain. He has a history of AV malformations and a GI bleed. He was admitted with significant anemia several weeks ago.  Medications: Outpatient medications: Prescriptions prior to admission  Medication Sig Dispense Refill Last Dose  . albuterol (PROVENTIL HFA;VENTOLIN HFA) 108 (90 BASE) MCG/ACT inhaler Inhale 2 puffs into the lungs every 6 (six) hours as needed for wheezing or shortness of breath. 1 Inhaler 2 10/28/2014 at Unknown time  . amLODipine (NORVASC) 10 MG tablet Take 1 tablet (10 mg total) by mouth daily. 30 tablet 0 10/28/2014 at Unknown time  . aspirin EC 81 MG tablet Take 1 tablet (81 mg total) by mouth daily. RESUME AFTER 1 WEEK.   10/28/2014 at Unknown time  . atorvastatin (LIPITOR) 80 MG tablet Take 1 tablet (80 mg total) by mouth daily at 6 PM. 30 tablet 0  10/28/2014 at Unknown time  . budesonide-formoterol (SYMBICORT) 80-4.5 MCG/ACT inhaler Inhale 2 puffs into the lungs 2 (two) times daily. 1 Inhaler 12 10/28/2014 at Unknown time  . citalopram (CELEXA) 20 MG tablet Take 20 mg by mouth daily.   10/28/2014 at Unknown time  . cloNIDine (CATAPRES) 0.3 MG tablet Take 1 tablet (0.3 mg total) by mouth 2 (two) times daily. 60 tablet 3 10/28/2014 at Unknown time  . diclofenac sodium (VOLTAREN) 1 % GEL Apply 4 g topically 4 (four) times daily. 100 g 0 10/28/2014 at Unknown time  . divalproex (DEPAKOTE ER) 500 MG 24 hr tablet Take 500 mg by mouth 2 (two) times daily.   10/28/2014 at Unknown time  . ferrous sulfate 325 (65 FE) MG tablet Take 325 mg by mouth 2 (two) times daily with a meal.    10/28/2014 at Unknown time  . hydrALAZINE (APRESOLINE) 100 MG tablet Take 100 mg by mouth 3 (three) times daily.   10/28/2014 at Unknown time  . HYDROcodone-acetaminophen (NORCO/VICODIN) 5-325 MG per tablet Take 1-2 tablets by mouth every 6 (six) hours as needed for moderate pain. 20 tablet 0 10/28/2014 at Unknown time  . isosorbide mononitrate (IMDUR) 30 MG 24 hr tablet Take 2 tablets (60 mg total) by mouth daily. 30 tablet 0  10/28/2014 at Unknown time  . lisinopril (PRINIVIL,ZESTRIL) 20 MG tablet Take 20 mg by mouth daily.   10/28/2014 at Unknown time  . metolazone (ZAROXOLYN) 5 MG tablet Take 1 tablet (5 mg total) by mouth as needed (for weight gain of 2 lbs in 2-3 days).   10/28/2014 at Unknown time  . pantoprazole (PROTONIX) 40 MG tablet Take 40 mg by mouth daily.    10/28/2014 at Unknown time  . phenytoin (DILANTIN) 100 MG ER capsule Take 200 mg by mouth at bedtime.   10/28/2014 at Unknown time  . phenytoin (DILANTIN) 200 MG ER capsule Take 100-200 mg by mouth 2 (two) times daily.    10/28/2014 at Unknown time  . potassium chloride SA (KLOR-CON M20) 20 MEQ tablet Take 20 mEq by mouth daily.   10/28/2014 at Unknown time  . torsemide (DEMADEX) 20 MG tablet Take 2 tablets (40 mg total)  by mouth daily. 60 tablet 1 10/28/2014 at Unknown time  . nitroGLYCERIN (NITROSTAT) 0.4 MG SL tablet Place 1 tablet (0.4 mg total) under the tongue every 5 (five) minutes as needed for chest pain (upto max 3 doses at one time.). (Patient not taking: Reported on 10/15/2014) 30 tablet 0 Not Taking at Unknown time    Current medications: Current Facility-Administered Medications  Medication Dose Route Frequency Provider Last Rate Last Dose  . albuterol (PROVENTIL) (2.5 MG/3ML) 0.083% nebulizer solution 3 mL  3 mL Inhalation Q6H PRN Rise Patience, MD      . amLODipine (NORVASC) tablet 10 mg  10 mg Oral Daily Rise Patience, MD      . aspirin EC tablet 81 mg  81 mg Oral Daily Rise Patience, MD      . atorvastatin (LIPITOR) tablet 80 mg  80 mg Oral q1800 Rise Patience, MD      . budesonide-formoterol (SYMBICORT) 80-4.5 MCG/ACT inhaler 2 puff  2 puff Inhalation BID Rise Patience, MD      . citalopram (CELEXA) tablet 20 mg  20 mg Oral Daily Rise Patience, MD      . cloNIDine (CATAPRES) tablet 0.3 mg  0.3 mg Oral BID Rise Patience, MD      . divalproex (DEPAKOTE ER) 24 hr tablet 500 mg  500 mg Oral BID Rise Patience, MD      . enoxaparin (LOVENOX) injection 40 mg  40 mg Subcutaneous Q24H Rise Patience, MD      . ferrous sulfate tablet 325 mg  325 mg Oral BID WC Rise Patience, MD      . hydrALAZINE (APRESOLINE) tablet 100 mg  100 mg Oral TID Rise Patience, MD      . HYDROcodone-acetaminophen (NORCO/VICODIN) 5-325 MG per tablet 1-2 tablet  1-2 tablet Oral Q6H PRN Rise Patience, MD   2 tablet at 10/29/14 [phone]  . isosorbide mononitrate (IMDUR) 24 hr tablet 60 mg  60 mg Oral Daily Rise Patience, MD      . lisinopril (PRINIVIL,ZESTRIL) tablet 20 mg  20 mg Oral Daily Rise Patience, MD      . metolazone (ZAROXOLYN) tablet 5 mg  5 mg Oral PRN Rise Patience, MD      . nitroGLYCERIN (NITROGLYN) 2 % ointment 1 inch  1 inch  Topical 4 times per day Varney Biles, MD   1 inch at 10/29/14 0636  . ondansetron (ZOFRAN) tablet 4 mg  4 mg Oral Q6H PRN Rise Patience, MD  Or  . ondansetron (ZOFRAN) injection 4 mg  4 mg Intravenous Q6H PRN Rise Patience, MD      . pantoprazole (PROTONIX) EC tablet 40 mg  40 mg Oral Daily Rise Patience, MD      . phenytoin (DILANTIN) ER capsule 200 mg  200 mg Oral BID Rise Patience, MD      . potassium chloride SA (K-DUR,KLOR-CON) CR tablet 20 mEq  20 mEq Oral Daily Rise Patience, MD      . sodium chloride 0.9 % injection 3 mL  3 mL Intravenous Q12H Rise Patience, MD      . torsemide (DEMADEX) tablet 40 mg  40 mg Oral Daily Rise Patience, MD         Allergies  Allergen Reactions  . Motrin [Ibuprofen] Other (See Comments)    Affects kidneys  . Tylenol [Acetaminophen] Other (See Comments)    Affects kidneys     Past Medical History  Diagnosis Date  . Iron deficiency anemia   . Chronic combined systolic and diastolic CHF, NYHA class 2     a. 11/2013 Echo: EF 40-45%, basl-mid inflat AK, Gr 2 DD.  [name] Hyperlipidemia LDL goal <70   . Ischemic cardiomyopathy     a. 11/2013 Echo: EF 40-45%, basl-mid inflat AK, Gr 2 DD, mild AI, mod dil LA/RA, mod reduced RV fxn, PASP 36mmHg.  . Moderate to severe pulmonary hypertension 03/2010    a. PA peak pressure of 76 mmHg (per 2D Echo 03/2010)  . Polysubstance abuse     a. cocaine, THC  . AV malformation of gastrointestinal tract     a. w/ h/o GIB.  [name] Family history of early CAD   . Peripheral vascular disease   . Seizure disorder   . Hypertension   . CAD (coronary artery disease)     a. s/p 3-vessel CABG (12/2007) // 100% RCA stenosis with collaterals from left system. Severe bifurcation lesions of proxima CXA and OM. Moderate LAD disease - followed by Dr. Wyline Copas in [facility];  b. 11/2013 Myoview: Large fixed inf defect w/o ischemia, EF 35%.  . Chronic kidney disease (CKD), stage III (moderate)     a.  BL SCr 1.5-1.6  . Renal artery stenosis   . Cancer     "I have cancer; right now it's not known what kind" (09/17/2014)  . Myocardial infarction 08/2007; 12/2007  . Anginal pain   . COPD (chronic obstructive pulmonary disease)   . Pneumonia     "I've had pneumonia once" (09/17/2014)  . Obstructive sleep apnea     "never told me to wear mask" (09/17/2014)  . History of blood transfusion "I've had quite a few"    "they don't know where it's coming from" (09/17/2014)  . History of hiatal hernia   . Seizures     "I've had a few; don't know what kind they call it" (09/17/2014)  . DDD (degenerative disc disease), lumbar   . Arthritis     "ankles; left hip; wrists" (09/17/2014)  . Chronic lower back pain   . Depression   . Bipolar disorder   . Schizophrenia     Past Surgical History  Procedure Laterality Date  . Apc  03/2010    To treat small bowel AVMs  . Esophagogastroduodenoscopy N/A 08/14/2012    Procedure: ESOPHAGOGASTRODUODENOSCOPY (EGD);  Surgeon: [name], MD;  Location: [facility] ENDOSCOPY;  Service: Endoscopy;  Laterality: N/A;  . Hot hemostasis N/A 08/14/2012  Procedure: HOT HEMOSTASIS (ARGON PLASMA COAGULATION/BICAP);  Surgeon: [name], MD;  Location: [facility] ENDOSCOPY;  Service: Endoscopy;  Laterality: N/A;  . Esophagogastroduodenoscopy (egd) with propofol N/A 08/04/2013    Procedure: ESOPHAGOGASTRODUODENOSCOPY (EGD) WITH PROPOFOL;  Surgeon: [name], MD;  Location: [facility] ENDOSCOPY;  Service: Endoscopy;  Laterality: N/A;  . Esophagogastroduodenoscopy (egd) with propofol N/A 06/08/2014    Procedure: ESOPHAGOGASTRODUODENOSCOPY (EGD) WITH PROPOFOL;  Surgeon: Milus Banister, MD;  Location: [place];  Service: Endoscopy;  Laterality: N/A;  . Peripheral vascular catheterization N/A 09/07/2014    Procedure: Abdominal Aortogram;  Surgeon: Angelia Mould, MD;  Location: [place] CV LAB;  Service: Cardiovascular;  Laterality: N/A;  . Coronary artery bypass graft  12/2007     "CABG X3"  . Coronary angioplasty with stent placement  08/2007  . Enteroscopy N/A 10/16/2014    Procedure: ENTEROSCOPY;  Surgeon: [name], MD;  Location: [facility] ENDOSCOPY;  Service: Endoscopy;  Laterality: N/A;    Family History  Problem Relation Age of Onset  . Heart disease Mother     unknown type  . Heart disease Father 61    died of MI at 66yo  . Heart disease Paternal Grandfather 48    died of MI  . Heart disease    . Heart disease Brother 87    Social History:  reports that he has quit smoking. His smoking use included Cigarettes. He has a 2.5 pack-year smoking history. He has never used smokeless tobacco. He reports that he uses illicit drugs (Cocaine and Marijuana) about once per week. He reports that he does not drink alcohol.   Review of Systems: Constitutional:  denies fever, chills, diaphoresis, appetite change and fatigue.  HEENT: denies photophobia, eye pain, redness, hearing loss, ear pain, congestion, sore throat, rhinorrhea, sneezing, neck pain, neck stiffness and tinnitus.  Respiratory: denies SOB, DOE, cough, chest tightness, and wheezing.  Cardiovascular: admits to chest pain,    Gastrointestinal: admits to nausea, vomiting, abdominal pain,    Genitourinary: denies dysuria, urgency, frequency, hematuria, flank pain and difficulty urinating.  Musculoskeletal: denies  myalgias, back pain, joint swelling, arthralgias and gait problem.   Skin: denies pallor, rash and wound.  Neurological: denies dizziness, seizures, syncope, weakness, light-headedness, numbness and headaches.   Hematological: denies adenopathy, easy bruising, personal or family bleeding history.  Psychiatric/ Behavioral: denies suicidal ideation, mood changes, confusion, nervousness, sleep disturbance and agitation.    Physical Exam: BP 168/99 mmHg  Pulse 89  Temp(Src) 97.7 F (36.5 C) (Oral)  Resp 22  Ht 5\' 5"  (1.651 m)  Wt 74.39 kg (164 lb)  BMI 27.29 kg/m2  SpO2 100%  Wt Readings  from Last 3 Encounters:  10/29/14 74.39 kg (164 lb)  10/18/14 79.6 kg (175 lb 7.8 oz)  10/07/14 71.759 kg (158 lb 3.2 oz)    General: Vital signs reviewed and noted. Well-developed, well-nourished, in no acute distress; alert,   Head: Normocephalic, atraumatic, sclera anicteric,   Neck: Supple. Negative for carotid bruits. No JVD   Lungs:  Clear bilaterally, no  wheezes, rales, or rhonchi. Breathing is normal   Heart: RRR with S1 S2. No murmurs, rubs, or gallops   Abdomen/ GI :  Soft, non-tender, non-distended with normoactive bowel sounds. No hepatomegaly. No rebound/guarding. No obvious abdominal masses   MSK: Strength and the appear normal for age.   Extremities: No clubbing or cyanosis. No edema.  Distal pedal pulses are 2+ and equal   Neurologic:  CN are grossly intact,  No obvious motor or sensory defect.  Alert and oriented X 3. Moves all extremities spontaneously.  Psych: Responds to questions appropriately with a normal affect.     Lab results: Basic Metabolic Panel:  Recent Labs Lab 10/29/14 0050  NA 137  K 4.0  CL 107  CO2 19*  GLUCOSE 93  BUN 24*  CREATININE 1.38*  CALCIUM 8.8*    Liver Function Tests:  Recent Labs Lab 10/29/14 0050  AST 34  ALT 15*  ALKPHOS 70  BILITOT 0.8  PROT 7.3  ALBUMIN 3.3*    Recent Labs Lab 10/29/14 0050  LIPASE 25   No results for input(s): AMMONIA in the last 168 hours.  CBC:  Recent Labs Lab 10/29/14 0050  WBC 5.4  NEUTROABS 4.1  HGB 9.0*  HCT 29.1*  MCV 88.4  PLT 318    Cardiac Enzymes:  Recent Labs Lab 10/29/14 0050  TROPONINI 0.07*    BNP: Invalid input(s): POCBNP  CBG: No results for input(s): GLUCAP in the last 168 hours.  Coagulation Studies: No results for input(s): LABPROT, INR in the last 72 hours.   Other results:  Personal review of EKG shows :  - NSR with occasioal PVCs.  NS ST abn.  Previous Inf. MI    Imaging: Dg Chest 2 View  10/29/2014   CLINICAL DATA:  One day history  of left-sided chest pain  EXAM: CHEST  2 VIEW  COMPARISON:  October 27, 2014  FINDINGS: There is cardiomegaly with slight pulmonary venous hypertension. There is slight interstitial edema in the mid lower lung zones bilaterally. No airspace consolidation. No adenopathy. Patient is status post coronary artery bypass grafting.  IMPRESSION: Cardiomegaly with slight edema. Evidence of a degree of congestive heart failure. No airspace consolidation. Very little change from 2 days prior.   Electronically Signed   By: Lowella Grip III M.D.   On: 10/29/2014 02:01         Assessment & Plan:  1. Chest pain: The patient presents with episodes of chest pain and very minimal troponin elevations. This is in the setting of significant hypertension and cocaine abuse. He's EKG is nonacute. Stressed the importance of avoiding cocaine. He tells me that this is due to second hand smoke. I informed him that he probably should avoid that particular secondhand smoke. I told him that as long as he  had cocaine in his system that he would continue to have heart problems.  At this point I do not think that there is any indication to do any further cardiac studies.   He needs to avoid further use of cocaine.  2. Hypertension:  This is clearly exacerbated by his cocaine use.  Continue home medications. No further elevations at this time.  3. Chronic diastolic congestive heart failure. Continue current medications. He appears to be euvolemic at this time.   He is followed here by Dr. Haroldine Laws in the CHF clinic.  Sees Dr. Gerlene Burdock in [facility].   Will sign off. Call for the CHF team for further questions   Ramond Dial., MD, [facility] 10/29/2014, 7:54 AM Office - [phone] Pager [phone]

## 2014-10-29 NOTE — Progress Notes (Addendum)
[facility] PROGRESS NOTE  Charles [name] ATF:[id number] DOB: [birth year] DOA: 10/28/2014 PCP: No PCP Per Patient  Brief Summary  Charles [name] is a 69 y.o. male with history of CAD status post CABG, cocaine abuse, seizure, GI bleed with AVMs, peripheral vascular disease, diastolic CHF presents to the ER because of chest pain and abdominal pain. He states he has been having these symptoms since yesterday at 2:00 PM. Patient also had 3 episodes of nausea vomiting. Denies any blood in the vomitus. In the ER patient's troponin is found to be mildly elevated with an EKG showing nonspecific findings. Dr. Radford Pax cardiologist on-call was consulted by the ER physician at this time Dr. Radford Pax is advised to cycle cardiac markers and if there is any further worsening to consult cardiology. Patient still has chest pain and has been placed on nitroglycerin patch. Patient was given 1 dose of Ativan following which patient became drowsy. Patient's urine drug screen is positive for cocaine. Patient's abdomen appears benign.   Assessment/Plan  Chest pain in setting of CAD s/p CABG and ongoing cocaine abuse -  Troponins mildly elevated but flat -  Continue when necessary nitroglycerin -  Continue Imdur, beta blocker, calcium channel blocker -  No narcotics for pain given patient's history of drug-seeking behavior and ongoing cocaine abuse  Acute kidney injury, baseline creatinine 1, currently 1.5, likely secondary to acute on chronic systolic and diastolic heart failure (cardiorenal syndrome) and cocaine abuse with hypertensive emergency -  Discontinue torsemide -  Start Lasix 40 mg IV twice a day -  1 dose of metolazone prior to a Lasix dose as per home regimen -  Daily weights and strict ins and outs -  Repeat BMP in a.m. -  Hold ACEI -  Dry weight is probably < 70kg based on recent admissions.  Wt is currently 74kg and CXR demonstrates vascular congestion/pulmonary edema  Hypertension with  current hypotension secondary to all of his home blood pressure medications being resumed. He is probably noncompliant with most of his medications which is suggested by his negative phenytoin and Depakote levels -  Discontinue clonidine -  ACE inhibitor held secondary to acute kidney injury -  Continue Norvasc, hydralazine  Cocaine abuse, counseled cessation  Seizure disorder, dilantin and depakote levels were low suggesting noncompliance -  Will encourage medication compliance  AVMs with recent GIB -  Monitor for rectal bleeding  PVD, recent angiogram -  F/u with vascular surgery  DIET:  Healthy heart Access:  PIV IVF:  off Proph:  lovenox  Code Status: full Family Communication: patient alone Disposition Plan: pending diuresis with improvement in pulmonary edema and possibly creatinine.  Likely home tomorrow.     Consultants:  Cardiology  Procedures:  CXR  Antibiotics:  none   HPI/Subjective:  Feels lightheaded, has fuzzy thinking. Has chest pains but declines to tell me more specific information such as where his pain is. Pain is severe.     Objective: Filed Vitals:   10/29/14 0809 10/29/14 1023 10/29/14 1039 10/29/14 1124  BP: 163/120 82/45 94/60 103/64  Pulse: 87     Temp:      TempSrc:      Resp: 25 18 24    Height:      Weight:      SpO2: 94%       Intake/Output Summary (Last 24 hours) at 10/29/14 1338 Last data filed at 10/29/14 0825  Gross per 24 hour  Intake    290 ml  Output  0 ml  Net    290 ml   Filed Weights   10/29/14 0605  Weight: 74.39 kg (164 lb)   Body mass index is 27.29 kg/(m^2).  Exam:   General:   Adult male, No acute distress, lying on side in bed and does not want to move for exam. Sleepy but arouseable  HEENT:  NCAT, MMM  Cardiovascular:  RRR, nl S1, S2 no mrg, 2+ pulses, warm extremities  Respiratory:  CTAB, no increased WOB  Abdomen:   NABS, soft, NT/ND  MSK:   Normal tone and bulk, no LEE  Neuro:   Grossly intact  Data Reviewed: Basic Metabolic Panel:  Recent Labs Lab 10/29/14 0050 10/29/14 0800  NA 137 137  K 4.0 4.0  CL 107 106  CO2 19* 19*  GLUCOSE 93 85  BUN 24* 25*  CREATININE 1.38* 1.55*  CALCIUM 8.8* 8.8*  MG  --  1.7   Liver Function Tests:  Recent Labs Lab 10/29/14 0050 10/29/14 0800  AST 34 33  ALT 15* 13*  ALKPHOS 70 65  BILITOT 0.8 0.9  PROT 7.3 7.3  ALBUMIN 3.3* 3.1*    Recent Labs Lab 10/29/14 0050 10/29/14 0800  LIPASE 25 26   No results for input(s): AMMONIA in the last 168 hours. CBC:  Recent Labs Lab 10/29/14 0050 10/29/14 0800  WBC 5.4 4.3  NEUTROABS 4.1 3.1  HGB 9.0* 8.9*  HCT 29.1* 28.6*  MCV 88.4 88.8  PLT 318 314    No results found for this or any previous visit (from the past 240 hour(s)).   Studies: Dg Chest 2 View  10/29/2014   CLINICAL DATA:  One day history of left-sided chest pain  EXAM: CHEST  2 VIEW  COMPARISON:  October 27, 2014  FINDINGS: There is cardiomegaly with slight pulmonary venous hypertension. There is slight interstitial edema in the mid lower lung zones bilaterally. No airspace consolidation. No adenopathy. Patient is status post coronary artery bypass grafting.  IMPRESSION: Cardiomegaly with slight edema. Evidence of a degree of congestive heart failure. No airspace consolidation. Very little change from 2 days prior.   Electronically Signed   By: Lowella Grip III M.D.   On: 10/29/2014 02:01    Scheduled Meds: . amLODipine  10 mg Oral Daily  . aspirin EC  81 mg Oral Daily  . atorvastatin  80 mg Oral q1800  . budesonide-formoterol  2 puff Inhalation BID  . citalopram  20 mg Oral Daily  . divalproex  500 mg Oral BID  . enoxaparin (LOVENOX) injection  40 mg Subcutaneous Q24H  . ferrous sulfate  325 mg Oral BID WC  . furosemide  40 mg Intravenous BID  . hydrALAZINE  100 mg Oral TID  . isosorbide mononitrate  60 mg Oral Daily  . lisinopril  20 mg Oral Daily  . metolazone  5 mg Oral Once  .  pantoprazole  40 mg Oral Daily  . phenytoin  200 mg Oral BID  . potassium chloride SA  20 mEq Oral Daily  . sodium chloride  500 mL Intravenous Once  . sodium chloride  3 mL Intravenous Q12H   Continuous Infusions:   Active Problems:   Seizure disorder   Chest pain   Cocaine abuse   Chronic diastolic heart failure   Nausea & vomiting   Elevated troponin    Time spent: 30 min    Lakeia Bradshaw, [facility] Pager [phone]. If 7PM-7AM, please contact night-coverage at [email or web address], password  [email or web address] 10/29/2014, 1:38 PM  LOS: 0 days

## 2014-10-29 NOTE — Progress Notes (Signed)
Patient has asked nurse for pain medication because is oral medication is not working. He complains of a 9/10 chest pain. MD notified. Patient is stable and does not appear to be in any discomfort. Vitals are stable.

## 2014-10-30 ENCOUNTER — Encounter (HOSPITAL_COMMUNITY): Payer: Self-pay | Admitting: Internal Medicine

## 2014-10-30 LAB — BASIC METABOLIC PANEL
ANION GAP: 11 (ref 5–15)
BUN: 30 mg/dL — AB (ref 6–20)
CO2: 21 mmol/L — ABNORMAL LOW (ref 22–32)
Calcium: 8.9 mg/dL (ref 8.9–10.3)
Chloride: 103 mmol/L (ref 101–111)
Creatinine, Ser: 1.77 mg/dL — ABNORMAL HIGH (ref 0.61–1.24)
GFR calc non Af Amer: 37 mL/min — ABNORMAL LOW (ref >60)
GFR, EST AFRICAN AMERICAN: 43 mL/min — AB (ref >60)
Glucose, Bld: 110 mg/dL — ABNORMAL HIGH (ref 65–99)
Potassium: 3.4 mmol/L — ABNORMAL LOW (ref 3.5–5.1)
Sodium: 135 mmol/L (ref 135–145)

## 2014-10-30 LAB — CBC
HCT: 29.6 % — ABNORMAL LOW (ref 39.0–52.0)
HEMOGLOBIN: 9.2 g/dL — AB (ref 13.0–17.0)
MCH: 26.9 pg (ref 26.0–34.0)
MCHC: 31.1 g/dL (ref 30.0–36.0)
MCV: 86.5 fL (ref 78.0–100.0)
Platelets: 393 10*3/uL (ref 150–400)
RBC: 3.42 MIL/uL — AB (ref 4.22–5.81)
RDW: 16.8 % — ABNORMAL HIGH (ref 11.5–15.5)
WBC: 5.9 10*3/uL (ref 4.0–10.5)

## 2014-10-30 MED ORDER — CLONIDINE HCL 0.1 MG PO TABS: 0.1000 mg | ORAL_TABLET | Freq: Three times a day (TID) | ORAL | Status: DC | PRN

## 2014-10-30 MED ORDER — TORSEMIDE 20 MG PO TABS
40.0000 mg | ORAL_TABLET | Freq: Every day | ORAL | Status: DC
  Administered 2014-10-30 – 2014-10-31 (×2): 40 mg via ORAL
  Filled 2014-10-30 (×2): qty 2

## 2014-10-30 NOTE — Evaluation (Signed)
Physical Therapy Evaluation Patient Details Name: Charles [name] MRN: [id number] DOB: 1945-12-23 Today's Date: 10/30/2014   History of Present Illness  69 yo male admitted with chest pain. Hx of CHF, noncompliance with medical regimen, chronic cocaine abuse, anemia, PVD, AVM, Sz, essential HTN.   Clinical Impression  On eval, pt was Supervision-Min guard assist for mobility-walked ~200 feet without an assistive device. Pt is unsteady at times but did not have overt LOB. Brief standing rest breaks taken during session. Dyspnea 2/4 with ambulation. Pt reported 9/10 pain in chest and L hip. Once therapist had pt settled back in room, pt called out for pain meds. Pt became angry that no one came right away and walked up to nursing station by himself. Pt requesting cane for use-could be beneficial due to chronic L hip pain with ambulation.     Follow Up Recommendations No PT follow up    Equipment Recommendations  Cane (RW recommended on last visit however pt stated he will not use a walker. Pt is requesting cane.)   Recommendations for Other Services       Precautions / Restrictions Precautions Precautions: Fall Restrictions Weight Bearing Restrictions: No      Mobility  Bed Mobility               General bed mobility comments: sitting EOB  Transfers Overall transfer level: Needs assistance   Transfers: Sit to/from Stand Sit to Stand: Supervision         General transfer comment: for safety  Ambulation/Gait Ambulation/Gait assistance: Min guard;Supervision Ambulation Distance (Feet): 200 Feet Assistive device: None Gait Pattern/deviations: Antalgic;Decreased stride length;Step-through pattern     General Gait Details: 2 brief standing rest breaks taken. Dyspnea 2/4 with activity. Unsteady at times but no overt LOB.   Stairs            Wheelchair Mobility    Modified Rankin (Stroke Patients Only)       Balance Overall balance assessment: Needs  assistance         Standing balance support: During functional activity Standing balance-Leahy Scale: Fair                               Pertinent Vitals/Pain Pain Assessment: 0-10 Pain Score: 9  Pain Location: chest, L hip Pain Descriptors / Indicators: Sore;Aching Pain Intervention(s): Limited activity within patient's tolerance;Repositioned    Home Living Family/patient expects to be discharged to:: Private residence Living Arrangements: Children Available Help at Discharge: Family Type of Home: House Home Access: Stairs to enter Entrance Stairs-Rails: Psychiatric nurse of Steps: 4 Home Layout: One level Home Equipment: None      Prior Function Level of Independence: Independent               Hand Dominance        Extremity/Trunk Assessment   Upper Extremity Assessment: Overall WFL for tasks assessed           Lower Extremity Assessment: Generalized weakness;LLE deficits/detail   LLE Deficits / Details: arthritis per pt-painful  Cervical / Trunk Assessment: Kyphotic  Communication   Communication: No difficulties  Cognition Arousal/Alertness: Awake/alert Behavior During Therapy: WFL for tasks assessed/performed Overall Cognitive Status: Within Functional Limits for tasks assessed                      General Comments      Exercises  Assessment/Plan    PT Assessment Patient needs continued PT services  PT Diagnosis Difficulty walking;Abnormality of gait;Acute pain   PT Problem List Decreased balance;Decreased mobility;Pain;Decreased activity tolerance  PT Treatment Interventions Gait training;DME instruction;Functional mobility training;Therapeutic activities;Balance training;Therapeutic exercise;Patient/family education   PT Goals (Current goals can be found in the Care Plan section) Acute Rehab PT Goals Patient Stated Goal: less pain PT Goal Formulation: With patient Time For Goal  Achievement: 11/13/14 Potential to Achieve Goals: Fair    Frequency Min 2X/week   Barriers to discharge        Co-evaluation               End of Session Equipment Utilized During Treatment: Gait belt Activity Tolerance: Patient limited by pain Patient left: in bed;with call bell/phone within reach           Time: 1330-1404 PT Time Calculation (min) (ACUTE ONLY): 34 min   Charges:   PT Evaluation $Initial PT Evaluation Tier I: 1 Procedure     PT G Codes:        Weston Anna, MPT Pager: [phone]

## 2014-10-30 NOTE — Progress Notes (Addendum)
[facility] PROGRESS NOTE  Sharbel Sahagun YBW:[id number] DOB: Oct 09, 1945 DOA: 10/28/2014 PCP: No PCP Per Patient  Brief Summary  Charles [name] is a 69 y.o. male with history of CAD status post CABG, cocaine abuse, seizure, GI bleed with AVMs, peripheral vascular disease, diastolic CHF presents to the ER because of chest pain and abdominal pain. He states he has been having these symptoms since yesterday at 2:00 PM. Patient also had 3 episodes of nausea vomiting. Denies any blood in the vomitus. In the ER patient's troponin is found to be mildly elevated with an EKG showing nonspecific findings. Dr. Radford Pax cardiologist on-call was consulted by the ER physician at this time Dr. Radford Pax is advised to cycle cardiac markers and if there is any further worsening to consult cardiology. Patient still has chest pain and has been placed on nitroglycerin patch. Patient was given 1 dose of Ativan following which patient became drowsy. Patient's urine drug screen is positive for cocaine. Patient's abdomen appears benign.   Assessment/Plan  Chest pain in setting of CAD s/p CABG and ongoing cocaine abuse -  Troponins mildly elevated but flat -  Continue when necessary nitroglycerin -  Continue Imdur, beta blocker, calcium channel blocker -  No narcotics for pain given patient's history of drug-seeking behavior and ongoing cocaine abuse  Acute kidney injury, baseline creatinine 1, currently 1.77 and rising, likely secondary to cocaine abuse with hypertensive emergency -  Resume torsemide -  D/c Lasix 40 mg IV twice a day -  Hold metolazone for a day -  Daily weights and strict ins and outs -  Repeat BMP in a.m. -  Hold ACEI -  Repeat BMP in AM  Hypertension  -  Continue to hold clonidine and ACEI -  Start prn clonidine -  Continue Norvasc, hydralazine  Cocaine abuse, counseled cessation again today.    Seizure disorder, dilantin and depakote levels were low suggesting  noncompliance -  Encouraged medication compliance  AVMs with recent GIB and mild blood loss/iron deficiency anemia -  Monitor for rectal bleeding -  hgb approximately stable  PVD, recent angiogram -  F/u with vascular surgery  Mild hypokalemia due to diuretics -  Oral potassium repletion  DIET:  Healthy heart Access:  PIV IVF:  off Proph:  lovenox  Code Status: full Family Communication: patient alone Disposition Plan: pending repeat creatinine in AM.  PT eval and ambulate on RA today.  Likely home tomorrow if creatinine stable to improved.     Consultants:  Cardiology  Procedures:  CXR  Antibiotics:  none   HPI/Subjective:  Feels lightheaded, particularly when standing. Has chest pains but declines to tell me more specific information such as where his pain is. Pain is severe.   Having nausea and states that he feels bad.    Objective: Filed Vitals:   10/30/14 0422 10/30/14 0743 10/30/14 0746 10/30/14 1126  BP: 137/80  160/88 145/76  Pulse:   77 78  Temp: 97.7 F (36.5 C)  98.4 F (36.9 C) 98.4 F (36.9 C)  TempSrc: Oral  Oral Oral  Resp: 19     Height:      Weight: 73.483 kg (162 lb)     SpO2: 100% 95% 100% 98%    Intake/Output Summary (Last 24 hours) at 10/30/14 1301 Last data filed at 10/30/14 1255  Gross per 24 hour  Intake    480 ml  Output   4125 ml  Net  -3645 ml   [employer]  10/29/14 0605 10/30/14 0422  Weight: 74.39 kg (164 lb) 73.483 kg (162 lb)   Body mass index is 26.96 kg/(m^2).  Exam:   General:   Adult male, No acute distress, able to sit up with ease  HEENT:  NCAT, MMM  Cardiovascular:  Distant heart sounds, RRR, 2+ pulses, warm extremities  Respiratory:  CTAB, no increased WOB  Abdomen:   NABS, soft, NT/ND  MSK:   Normal tone and bulk, no LEE  Neuro:  Grossly intact  Data Reviewed: Basic Metabolic Panel:  Recent Labs Lab 10/29/14 0050 10/29/14 0800 10/30/14 0506  NA 137 137 135  K 4.0 4.0 3.4*  CL  107 106 103  CO2 19* 19* 21*  GLUCOSE 93 85 110*  BUN 24* 25* 30*  CREATININE 1.38* 1.55* 1.77*  CALCIUM 8.8* 8.8* 8.9  MG  --  1.7  --    Liver Function Tests:  Recent Labs Lab 10/29/14 0050 10/29/14 0800  AST 34 33  ALT 15* 13*  ALKPHOS 70 65  BILITOT 0.8 0.9  PROT 7.3 7.3  ALBUMIN 3.3* 3.1*    Recent Labs Lab 10/29/14 0050 10/29/14 0800  LIPASE 25 26   No results for input(s): AMMONIA in the last 168 hours. CBC:  Recent Labs Lab 10/29/14 0050 10/29/14 0800 10/30/14 0506  WBC 5.4 4.3 5.9  NEUTROABS 4.1 3.1  --   HGB 9.0* 8.9* 9.2*  HCT 29.1* 28.6* 29.6*  MCV 88.4 88.8 86.5  PLT 318 314 393    No results found for this or any previous visit (from the past 240 hour(s)).   Studies: Dg Chest 2 View  10/29/2014   CLINICAL DATA:  One day history of left-sided chest pain  EXAM: CHEST  2 VIEW  COMPARISON:  October 27, 2014  FINDINGS: There is cardiomegaly with slight pulmonary venous hypertension. There is slight interstitial edema in the mid lower lung zones bilaterally. No airspace consolidation. No adenopathy. Patient is status post coronary artery bypass grafting.  IMPRESSION: Cardiomegaly with slight edema. Evidence of a degree of congestive heart failure. No airspace consolidation. Very little change from 2 days prior.   Electronically Signed   By: Lowella Grip III M.D.   On: 10/29/2014 02:01    Scheduled Meds: . amLODipine  10 mg Oral Daily  . aspirin EC  81 mg Oral Daily  . atorvastatin  80 mg Oral q1800  . budesonide-formoterol  2 puff Inhalation BID  . citalopram  20 mg Oral Daily  . divalproex  500 mg Oral BID  . enoxaparin (LOVENOX) injection  40 mg Subcutaneous Q24H  . ferrous sulfate  325 mg Oral BID WC  . hydrALAZINE  100 mg Oral TID  . isosorbide mononitrate  60 mg Oral Daily  . pantoprazole  40 mg Oral Daily  . phenytoin  200 mg Oral BID  . potassium chloride SA  20 mEq Oral Daily  . sodium chloride  500 mL Intravenous Once  . sodium  chloride  3 mL Intravenous Q12H  . torsemide  40 mg Oral Daily   Continuous Infusions:   Active Problems:   Seizure disorder   Chest pain   Cocaine abuse   Chronic diastolic heart failure   Ischemic chest pain   Nausea & vomiting   Elevated troponin    Time spent: 30 min    Elowen Debruyn, [facility] Pager [phone]. If 7PM-7AM, please contact night-coverage at [email or web address], password [email or web address] 10/30/2014, 1:01 PM  LOS: 1 day

## 2014-10-31 LAB — CBC
HCT: 31.9 % — ABNORMAL LOW (ref 39.0–52.0)
Hemoglobin: 9.9 g/dL — ABNORMAL LOW (ref 13.0–17.0)
MCH: 26.4 pg (ref 26.0–34.0)
MCHC: 31 g/dL (ref 30.0–36.0)
MCV: 85.1 fL (ref 78.0–100.0)
PLATELETS: 436 10*3/uL — AB (ref 150–400)
RBC: 3.75 MIL/uL — AB (ref 4.22–5.81)
RDW: 16.7 % — ABNORMAL HIGH (ref 11.5–15.5)
WBC: 5.2 10*3/uL (ref 4.0–10.5)

## 2014-10-31 LAB — BASIC METABOLIC PANEL
Anion gap: 12 (ref 5–15)
BUN: 35 mg/dL — ABNORMAL HIGH (ref 6–20)
CO2: 25 mmol/L (ref 22–32)
Calcium: 9.1 mg/dL (ref 8.9–10.3)
Chloride: 96 mmol/L — ABNORMAL LOW (ref 101–111)
Creatinine, Ser: 1.75 mg/dL — ABNORMAL HIGH (ref 0.61–1.24)
GFR calc Af Amer: 44 mL/min — ABNORMAL LOW (ref >60)
GFR calc non Af Amer: 38 mL/min — ABNORMAL LOW (ref >60)
GLUCOSE: 96 mg/dL (ref 65–99)
Potassium: 3.4 mmol/L — ABNORMAL LOW (ref 3.5–5.1)
Sodium: 133 mmol/L — ABNORMAL LOW (ref 135–145)

## 2014-10-31 MED ORDER — PHENYTOIN SODIUM EXTENDED 200 MG PO CAPS: 200.0000 mg | ORAL_CAPSULE | Freq: Two times a day (BID) | ORAL | Status: AC

## 2014-10-31 NOTE — Discharge Summary (Signed)
Physician Discharge Summary  Charles [name] YYT:[id number] DOB: 1945-04-17 DOA: 10/28/2014  PCP: No PCP Per Patient  Admit date: 10/28/2014 Discharge date: 10/31/2014  Recommendations for Outpatient Follow-up:  1. F/u with PCP in 1 week for repeat CBC and BMP to check creatinine.   2. F/u with vascular surgery at already scheduled appointment on August 17th 3. Cardiology in 2 weeks for heart failure follow up.  Held ACEI due to AKI and will need to be restarted if creatinine back to baseline 4. GI in 1-2 months regarding rectal bleeding.   5. Advised to stop using drugs and alcohol.    Discharge Diagnoses:  Active Problems:   Seizure disorder   Chest pain   Cocaine abuse   Chronic diastolic heart failure   Ischemic chest pain   Nausea & vomiting   Elevated troponin   Discharge Condition: stable, improved  Diet recommendation: low sodium  Wt Readings from Last 3 Encounters:  10/31/14 71.578 kg (157 lb 12.8 oz)  10/18/14 79.6 kg (175 lb 7.8 oz)  10/07/14 71.759 kg (158 lb 3.2 oz)    History of present illness:  Charles [name] is a 69 y.o. male with history of CAD status post CABG, cocaine abuse, seizure, GI bleed with AVMs, peripheral vascular disease, diastolic CHF presents to the ER because of chest pain and abdominal pain. He stated he had been having these symptoms since the day prior to admission. Patient also had 3 episodes of nausea vomiting. Denied any blood in the vomitus. In the ER patient's troponin was mildly elevated with an EKG showing nonspecific findings. Dr. Radford Pax cardiologist on-call was consulted by the ER physician at this time Dr. Radford Pax is advised to cycle cardiac markers and if there is any further worsening to consult cardiology. Patient still had chest pain and was been placed on nitroglycerin patch. Patient was given 1 dose of Ativan following which patient became drowsy. Patient's urine drug screen was once again positive for cocaine. Patient's  abdomen appeared benign.   Hospital Course:   Chest pain due to using cocaine in setting of CAD s/p CABG.  His troponins were mildly elevated but flat.  He continued prn nitroglycerin, imdur, BB, CCB.  Cautious use of beta blocker in setting of cocaine use.  He was not given narcotics due to his history of drug-seeking behaviors and ongoing cocaine abuse.    Acute kidney injury, baseline creatinine 1, currently 1.77 and rose likely secondary to cocaine abuse with hypertensive emergency and diuresis with IV lasix.  His IV lasix and ACEI were discontinued and he resumed his home torsemide and his creatinine started trending down.  Repeat BMP in 1 week and he should continue daily weights at home.    Hypertension, blood pressures trended down to wnl despite holding clonidine and ACEI.  He continued norvasc and hydralazine.    Cocaine abuse, counseled cessation.   Seizure disorder, dilantin and depakote levels were low suggesting noncompliance.  Encouraged medication compliance.    AVMs with recent GIB and mild blood loss/iron deficiency anemia.  No evidence of rectal bleeding.    PVD, recent angiogram.  F/u with vascular surgery at already scheduled appointment.    Mild hypokalemia due to diuretics, oral potassium repletion.    Generalized weakness, worked with PT who recommended a cane and no further follow up.   Consultants:  Cardiology  Procedures:  CXR  Antibiotics:  none  Discharge Exam: Filed Vitals:   10/31/14 0438  BP: 118/73  Pulse:  98  Temp: 98.6 F (37 C)  Resp:    Filed Vitals:   10/30/14 1628 10/30/14 2008 10/30/14 2105 10/31/14 0438  BP: 136/73  129/67 118/73  Pulse: 82  94 98  Temp: 97.7 F (36.5 C)  98.4 F (36.9 C) 98.6 F (37 C)  TempSrc: Oral  Oral Oral  Resp:      Height:      Weight:    71.578 kg (157 lb 12.8 oz)  SpO2: 100% 96% 98% 98%     General: Adult male, No acute distress, able to ambulate with ease   HEENT: NCAT,  MMM  Cardiovascular: Distant heart sounds, RRR, 2+ pulses, warm extremities  Respiratory: CTAB, no increased WOB  Abdomen: NABS, soft, NT/ND  MSK: Normal tone and bulk, no LEE  Neuro: Grossly intact  Discharge Instructions      Discharge Instructions    (HEART FAILURE PATIENTS) Call MD:  Anytime you have any of the following symptoms: 1) 3 pound weight gain in 24 hours or 5 pounds in 1 week 2) shortness of breath, with or without a dry hacking cough 3) swelling in the hands, feet or stomach 4) if you have to sleep on extra pillows at night in order to breathe.    Complete by:  As directed      Call MD for:  difficulty breathing, headache or visual disturbances    Complete by:  As directed      Call MD for:  extreme fatigue    Complete by:  As directed      Call MD for:  hives    Complete by:  As directed      Call MD for:  persistant dizziness or light-headedness    Complete by:  As directed      Call MD for:  persistant nausea and vomiting    Complete by:  As directed      Call MD for:  severe uncontrolled pain    Complete by:  As directed      Call MD for:  temperature >100.4    Complete by:  As directed      Diet - low sodium heart healthy    Complete by:  As directed      Discharge instructions    Complete by:  As directed   Please stop your clonidine and lisinopril since your blood pressure is normal without taking these medications.  Your lisinopril may need to be restarted soon, however.  Please follow up with cardiology in 1-2 weeks for repeat blood pressure check and bloodwork.  Keep your appointment with vascular surgery on the 17th.  STOP using cocaine.     Increase activity slowly    Complete by:  As directed             Medication List    STOP taking these medications        cloNIDine 0.3 MG tablet  Commonly known as:  CATAPRES     lisinopril 20 MG tablet  Commonly known as:  PRINIVIL,ZESTRIL      TAKE these medications        albuterol 108  (90 BASE) MCG/ACT inhaler  Commonly known as:  PROVENTIL HFA;VENTOLIN HFA  Inhale 2 puffs into the lungs every 6 (six) hours as needed for wheezing or shortness of breath.     amLODipine 10 MG tablet  Commonly known as:  NORVASC  Take 1 tablet (10 mg total) by mouth daily.  aspirin EC 81 MG tablet  Take 1 tablet (81 mg total) by mouth daily. RESUME AFTER 1 WEEK.     atorvastatin 80 MG tablet  Commonly known as:  LIPITOR  Take 1 tablet (80 mg total) by mouth daily at 6 PM.     budesonide-formoterol 80-4.5 MCG/ACT inhaler  Commonly known as:  SYMBICORT  Inhale 2 puffs into the lungs 2 (two) times daily.     citalopram 20 MG tablet  Commonly known as:  CELEXA  Take 20 mg by mouth daily.     diclofenac sodium 1 % Gel  Commonly known as:  VOLTAREN  Apply 4 g topically 4 (four) times daily.     divalproex 500 MG 24 hr tablet  Commonly known as:  DEPAKOTE ER  Take 500 mg by mouth 2 (two) times daily.     ferrous sulfate 325 (65 FE) MG tablet  Take 325 mg by mouth 2 (two) times daily with a meal.     hydrALAZINE 100 MG tablet  Commonly known as:  APRESOLINE  Take 100 mg by mouth 3 (three) times daily.     HYDROcodone-acetaminophen 5-325 MG per tablet  Commonly known as:  NORCO/VICODIN  Take 1-2 tablets by mouth every 6 (six) hours as needed for moderate pain.     isosorbide mononitrate 30 MG 24 hr tablet  Commonly known as:  IMDUR  Take 2 tablets (60 mg total) by mouth daily.     KLOR-CON M20 20 MEQ tablet  Generic drug:  potassium chloride SA  Take 20 mEq by mouth daily.     metolazone 5 MG tablet  Commonly known as:  ZAROXOLYN  Take 1 tablet (5 mg total) by mouth as needed (for weight gain of 2 lbs in 2-3 days).     nitroGLYCERIN 0.4 MG SL tablet  Commonly known as:  NITROSTAT  Place 1 tablet (0.4 mg total) under the tongue every 5 (five) minutes as needed for chest pain (upto max 3 doses at one time.).     pantoprazole 40 MG tablet  Commonly known as:   PROTONIX  Take 40 mg by mouth daily.     phenytoin 200 MG ER capsule  Commonly known as:  DILANTIN  Take 1 capsule (200 mg total) by mouth 2 (two) times daily.     torsemide 20 MG tablet  Commonly known as:  DEMADEX  Take 2 tablets (40 mg total) by mouth daily.       Follow-up Information    Follow up with [facility]    . Schedule an appointment as soon as possible for a visit in 1 week.   Contact information:   [place] [id number] [phone]      Follow up with Norberto Sorenson T. Fuller Plan, MD In 2 months.   Specialty:  Gastroenterology   Contact information:   [address] [place] [address] [phone]       Follow up with Thompson Grayer, MD. Schedule an appointment as soon as possible for a visit in 2 weeks.   Specialty:  Cardiology   Contact information:   [place] [address] [phone]       Follow up with Deitra Mayo, MD On 11/04/2014.   Specialties:  Vascular Surgery, Cardiology   Contact information:   [address] [place] [address] [phone]        The results of significant diagnostics from this  hospitalization (including imaging, microbiology, ancillary and laboratory) are listed below for reference.    Significant Diagnostic Studies: Dg Chest 2 View  10/29/2014   CLINICAL DATA:  One day history of left-sided chest pain  EXAM: CHEST  2 VIEW  COMPARISON:  October 27, 2014  FINDINGS: There is cardiomegaly with slight pulmonary venous hypertension. There is slight interstitial edema in the mid lower lung zones bilaterally. No airspace consolidation. No adenopathy. Patient is status post coronary artery bypass grafting.  IMPRESSION: Cardiomegaly with slight edema. Evidence of a degree of congestive heart failure. No airspace consolidation. Very little change from 2 days prior.   Electronically Signed   By: Lowella Grip III M.D.   On: 10/29/2014 02:01    Dg Chest 2 View  10/15/2014   CLINICAL DATA:  Chest pain for 2 days.  EXAM: CHEST  2 VIEW  COMPARISON:  Chest x-rays dated 10/05/2014 and 08/10/2014. Comparison is also made to a CT angiogram of the neck and chest dated 10/06/2014.  FINDINGS: Cardiomegaly is unchanged. Again noted is central pulmonary vascular congestion and mild bilateral interstitial prominence most suggestive of interstitial edema. No confluent airspace opacity to suggest a developing pneumonia. No pleural effusion seen. No pneumothorax. Median sternotomy wires appear intact and stable in position. No acute osseous abnormality identified.  IMPRESSION: Cardiomegaly with central pulmonary vascular congestion and mild bilateral interstitial edema suggesting mild volume overload/congestive heart failure. This may be a chronic volume overload/congestive heart failure as the appearance is stable compared to multiple prior exams.   Electronically Signed   By: Franki Cabot M.D.   On: 10/15/2014 15:21   Ct Angio Chest Pe W/cm &/or Wo Cm  10/06/2014   CLINICAL DATA:  Acute onset of upper back pain and epigastric abdominal pain. Assess for pulmonary embolus. Initial encounter.  EXAM: CT ANGIOGRAPHY CHEST  CT ABDOMEN AND PELVIS WITH CONTRAST  TECHNIQUE: Multidetector CT imaging of the chest was performed using the standard protocol during bolus administration of intravenous contrast. Multiplanar CT image reconstructions and MIPs were obtained to evaluate the vascular anatomy. Multidetector CT imaging of the abdomen and pelvis was performed using the standard protocol during bolus administration of intravenous contrast.  CONTRAST:  137mL OMNIPAQUE IOHEXOL 350 MG/ML SOLN  COMPARISON:  Chest radiograph performed 10/05/2014, CTA of the chest performed 04/26/2014, and CT of the abdomen and pelvis from 07/22/2014  FINDINGS: CTA CHEST FINDINGS  There is no evidence of pulmonary embolus.  Mild bilateral interstitial prominence may reflect mild interstitial  lung disease or possibly minimal interstitial edema. There is no evidence of pleural effusion or pneumothorax. No masses are identified; no abnormal focal contrast enhancement is seen.  There is dilatation of the ascending thoracic aorta to 4.6 cm in maximal AP dimension. Diffuse coronary artery calcifications are seen. Cardiomegaly is noted. Visualized mediastinal nodes remain normal in size. The patient is status post median sternotomy. No pericardial effusion is identified. The great vessels are grossly unremarkable in appearance. No axillary lymphadenopathy is seen. The visualized portions of the thyroid gland are unremarkable in appearance.  No acute osseous abnormalities are seen.  CT ABDOMEN and PELVIS FINDINGS  The liver and spleen are unremarkable in appearance. The gallbladder is within normal limits. The pancreas and adrenal glands are unremarkable.  A 1.3 cm cyst is noted at the interpole region of the left kidney. Nonspecific perinephric stranding is noted bilaterally. Calcifications are seen in both renal hila, vascular in nature. There is no evidence of hydronephrosis. No renal or  ureteral stones are seen. No perinephric stranding is appreciated.  No free fluid is identified. The small bowel is unremarkable in appearance. The stomach is within normal limits. No acute vascular abnormalities are seen.  Diffuse calcification is noted along the abdominal aorta and its branches, with likely severe luminal narrowing noted at the proximal common iliac arteries bilaterally.  The appendix is normal in caliber and contains air, without evidence of appendicitis. The colon is unremarkable in appearance.  The bladder is mildly distended and grossly unremarkable. The prostate remains normal in size. No inguinal lymphadenopathy is seen.  No acute osseous abnormalities are identified. Vacuum phenomenon is noted along the lower lumbar spine.  Review of the MIP images confirms the above findings.  IMPRESSION: 1. No  evidence of pulmonary embolus. 2. Mild bilateral interstitial prominence may reflect mild interstitial lung disease or possibly minimal interstitial edema. 3. 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 Recommend semi-annual imaging followup by CTA or MRA and referral to cardiothoracic surgery if not already obtained. This recommendation follows 2010 ACCF/AHA/AATS/ACR/ASA/SCA/SCAI/SIR/STS/SVM Guidelines for the Diagnosis and Management of Patients With Thoracic Aortic Disease. Circulation. 2010; 121: R443-X540 4. Small left renal cyst noted. 5. Diffuse calcification along the abdominal aorta and its branches, with likely severe luminal narrowing at the proximal common iliac arteries bilaterally.   Electronically Signed   By: Garald Balding M.D.   On: 10/06/2014 02:55   Ct Abdomen Pelvis W Contrast  10/06/2014   CLINICAL DATA:  Acute onset of upper back pain and epigastric abdominal pain. Assess for pulmonary embolus. Initial encounter.  EXAM: CT ANGIOGRAPHY CHEST  CT ABDOMEN AND PELVIS WITH CONTRAST  TECHNIQUE: Multidetector CT imaging of the chest was performed using the standard protocol during bolus administration of intravenous contrast. Multiplanar CT image reconstructions and MIPs were obtained to evaluate the vascular anatomy. Multidetector CT imaging of the abdomen and pelvis was performed using the standard protocol during bolus administration of intravenous contrast.  CONTRAST:  1102mL OMNIPAQUE IOHEXOL 350 MG/ML SOLN  COMPARISON:  Chest radiograph performed 10/05/2014, CTA of the chest performed 04/26/2014, and CT of the abdomen and pelvis from 07/22/2014  FINDINGS: CTA CHEST FINDINGS  There is no evidence of pulmonary embolus.  Mild bilateral interstitial prominence may reflect mild interstitial lung disease or possibly minimal interstitial edema. There is no evidence of pleural effusion or pneumothorax. No masses are identified; no abnormal focal contrast enhancement  is seen.  There is dilatation of the ascending thoracic aorta to 4.6 cm in maximal AP dimension. Diffuse coronary artery calcifications are seen. Cardiomegaly is noted. Visualized mediastinal nodes remain normal in size. The patient is status post median sternotomy. No pericardial effusion is identified. The great vessels are grossly unremarkable in appearance. No axillary lymphadenopathy is seen. The visualized portions of the thyroid gland are unremarkable in appearance.  No acute osseous abnormalities are seen.  CT ABDOMEN and PELVIS FINDINGS  The liver and spleen are unremarkable in appearance. The gallbladder is within normal limits. The pancreas and adrenal glands are unremarkable.  A 1.3 cm cyst is noted at the interpole region of the left kidney. Nonspecific perinephric stranding is noted bilaterally. Calcifications are seen in both renal hila, vascular in nature. There is no evidence of hydronephrosis. No renal or ureteral stones are seen. No perinephric stranding is appreciated.  No free fluid is identified. The small bowel is unremarkable in appearance. The stomach is within normal limits. No acute vascular abnormalities are seen.  Diffuse calcification is noted along the abdominal aorta and its branches, with likely severe luminal narrowing noted at the proximal common iliac arteries bilaterally.  The appendix is normal in caliber and contains air, without evidence of appendicitis. The colon is unremarkable in appearance.  The bladder is mildly distended and grossly unremarkable. The prostate remains normal in size. No inguinal lymphadenopathy is seen.  No acute osseous abnormalities are identified. Vacuum phenomenon is noted along the lower lumbar spine.  Review of the MIP images confirms the above findings.  IMPRESSION: 1. No evidence of pulmonary embolus. 2. Mild bilateral interstitial prominence may reflect mild interstitial lung disease or possibly minimal interstitial edema. 3. Dilatation of the  ascending thoracic aorta to 4.6 cm in maximal AP dimension. Recommend semi-annual imaging followup by CTA or MRA and referral to cardiothoracic surgery if not already obtained. This recommendation follows 2010 ACCF/AHA/AATS/ACR/ASA/SCA/SCAI/SIR/STS/SVM Guidelines for the Diagnosis and Management of Patients With Thoracic Aortic Disease. Circulation. 2010; 121: Y774-J287 4. Small left renal cyst noted. 5. Diffuse calcification along the abdominal aorta and its branches, with likely severe luminal narrowing at the proximal common iliac arteries bilaterally.   Electronically Signed   By: Garald Balding M.D.   On: 10/06/2014 02:55   Dg Chest Port 1 View  10/05/2014   CLINICAL DATA:  69 year old male with chest pain  EXAM: PORTABLE CHEST - 1 VIEW  COMPARISON:  Radiograph dated 10/04/2014  FINDINGS: There is stable cardiomegaly with prominence of the central vasculature. An area of increased opacity at the left lower lung field may be related to an enlarged cardiac silhouette. Underlying pneumonia is not excluded. Clinical correlation is recommended. No significant pleural effusion or pneumothorax. Median sternotomy wires.  IMPRESSION: Stable cardiomegaly with congestive changes.  Left lower lung field opacity may be related to atelectasis or pneumonia. Clinical correlation is recommended.   Electronically Signed   By: Anner Crete M.D.   On: 10/05/2014 22:51   Ct Angio Chest Aorta W/cm &/or Wo/cm  10/15/2014   CLINICAL DATA:  Chest pain.  Shortness of breath.  Low hemoglobin.  EXAM: CT ANGIOGRAPHY CHEST, ABDOMEN AND PELVIS  TECHNIQUE: Multidetector CT imaging through the chest, abdomen and pelvis was performed using the standard protocol during bolus administration of intravenous contrast. Multiplanar reconstructed images and MIPs were obtained and reviewed to evaluate the vascular anatomy.  CONTRAST:  150mL OMNIPAQUE IOHEXOL 350 MG/ML SOLN  COMPARISON:  None.  Chest x-ray dated 10/15/2014 and CT angiogram of  the chest abdomen pelvis dated 10/06/2014  FINDINGS: CTA CHEST FINDINGS  There are no pulmonary emboli and there is no aortic dissection. There are scattered calcifications in the thoracic aorta. Extensive coronary artery calcification. Prior CABG. Cardiomegaly, unchanged since the prior exam. The patient has chronic interstitial lung disease with slight interstitial edema at the lung bases, improved since the prior CT scan. Tiny left pleural effusion.  Review of the MIP images confirms the above findings.  CTA ABDOMEN AND PELVIS FINDINGS  Extensive atherosclerosis of the arteries of the abdomen and pelvis. Severe bilateral proximal common iliac artery stenoses. No dissection or aneurysm. Single widely patent renal arteries bilaterally.  Liver, biliary tree, spleen, pancreas, adrenal glands and kidneys are normal except for an exophytic 12 mm hyperdense cyst on the lateral aspect of the mid left kidney, unchanged in size in densities since 03/08/2013.  The bowel is normal. Bladder is normal. No acute osseous abnormalities.  Review of the MIP images confirms the above findings.  IMPRESSION: 1. No acute  abnormalities of the chest or abdomen. Specifically, no evidence of aortic dissection or pulmonary emboli. 2. Improved slight interstitial pulmonary edema at the lung bases superimposed on chronic interstitial lung disease. 3. Extensive aortic atherosclerosis with severe stenoses of the common iliac arteries bilaterally.   Electronically Signed   By: Lorriane Shire M.D.   On: 10/15/2014 17:29   Dg Hip Unilat With Pelvis 2-3 Views Left  10/16/2014   CLINICAL DATA:  Left hip pain.  EXAM: DG HIP (WITH OR WITHOUT PELVIS) 2-3V LEFT  COMPARISON:  None.  FINDINGS: Mild osteoarthritis of both hips seen in the frontal projection. Proliferative changes are seen adjacent to the greater trochanter. No evidence of acute fracture or dislocation. No bony lesions or destruction identified. Vascular calcifications are seen in the  iliac and femoral arteries. The bony pelvis is intact and has a normal appearance. Visible degenerative disc disease in the lower lumbar spine. Soft tissues are unremarkable.  IMPRESSION: No fracture or dislocation.  Mild osteoarthritis of the left hip.   Electronically Signed   By: Aletta Edouard M.D.   On: 10/16/2014 16:31   Ct Cta Abd/pel W/cm &/or W/o Cm  10/15/2014   CLINICAL DATA:  Chest pain.  Shortness of breath.  Low hemoglobin.  EXAM: CT ANGIOGRAPHY CHEST, ABDOMEN AND PELVIS  TECHNIQUE: Multidetector CT imaging through the chest, abdomen and pelvis was performed using the standard protocol during bolus administration of intravenous contrast. Multiplanar reconstructed images and MIPs were obtained and reviewed to evaluate the vascular anatomy.  CONTRAST:  129mL OMNIPAQUE IOHEXOL 350 MG/ML SOLN  COMPARISON:  None.  Chest x-ray dated 10/15/2014 and CT angiogram of the chest abdomen pelvis dated 10/06/2014  FINDINGS: CTA CHEST FINDINGS  There are no pulmonary emboli and there is no aortic dissection. There are scattered calcifications in the thoracic aorta. Extensive coronary artery calcification. Prior CABG. Cardiomegaly, unchanged since the prior exam. The patient has chronic interstitial lung disease with slight interstitial edema at the lung bases, improved since the prior CT scan. Tiny left pleural effusion.  Review of the MIP images confirms the above findings.  CTA ABDOMEN AND PELVIS FINDINGS  Extensive atherosclerosis of the arteries of the abdomen and pelvis. Severe bilateral proximal common iliac artery stenoses. No dissection or aneurysm. Single widely patent renal arteries bilaterally.  Liver, biliary tree, spleen, pancreas, adrenal glands and kidneys are normal except for an exophytic 12 mm hyperdense cyst on the lateral aspect of the mid left kidney, unchanged in size in densities since 03/08/2013.  The bowel is normal. Bladder is normal. No acute osseous abnormalities.  Review of the MIP  images confirms the above findings.  IMPRESSION: 1. No acute abnormalities of the chest or abdomen. Specifically, no evidence of aortic dissection or pulmonary emboli. 2. Improved slight interstitial pulmonary edema at the lung bases superimposed on chronic interstitial lung disease. 3. Extensive aortic atherosclerosis with severe stenoses of the common iliac arteries bilaterally.   Electronically Signed   By: Lorriane Shire M.D.   On: 10/15/2014 17:29    Microbiology: No results found for this or any previous visit (from the past 240 hour(s)).   Labs: Basic Metabolic Panel:  Recent Labs Lab 10/29/14 0050 10/29/14 0800 10/30/14 0506 10/31/14 0539  NA 137 137 135 133*  K 4.0 4.0 3.4* 3.4*  CL 107 106 103 96*  CO2 19* 19* 21* 25  GLUCOSE 93 85 110* 96  BUN 24* 25* 30* 35*  CREATININE 1.38* 1.55* 1.77* 1.75*  CALCIUM 8.8*  8.8* 8.9 9.1  MG  --  1.7  --   --    Liver Function Tests:  Recent Labs Lab 10/29/14 0050 10/29/14 0800  AST 34 33  ALT 15* 13*  ALKPHOS 70 65  BILITOT 0.8 0.9  PROT 7.3 7.3  ALBUMIN 3.3* 3.1*    Recent Labs Lab 10/29/14 0050 10/29/14 0800  LIPASE 25 26   No results for input(s): AMMONIA in the last 168 hours. CBC:  Recent Labs Lab 10/29/14 0050 10/29/14 0800 10/30/14 0506 10/31/14 0539  WBC 5.4 4.3 5.9 5.2  NEUTROABS 4.1 3.1  --   --   HGB 9.0* 8.9* 9.2* 9.9*  HCT 29.1* 28.6* 29.6* 31.9*  MCV 88.4 88.8 86.5 85.1  PLT 318 314 393 436*   Cardiac Enzymes:  Recent Labs Lab 10/29/14 0050 10/29/14 0800 10/29/14 1159 10/29/14 1708  TROPONINI 0.07* 0.07* 0.06* 0.06*   BNP: BNP (last 3 results)  Recent Labs  09/15/14 1430 10/05/14 2201 10/15/14 1632  BNP 3756.9* 748.5* 2506.0*    ProBNP (last 3 results)  Recent Labs  01/01/14 1107 02/07/14 0123 03/01/14 2250  PROBNP 3225.0* 2648.0* 7421.0*    CBG: No results for input(s): GLUCAP in the last 168 hours.  Time coordinating discharge: 35 minutes  Signed:  Kamea Dacosta,  Vernestine Brodhead  [facility] 10/31/2014, 10:28 AM

## 2014-10-31 NOTE — Discharge Instructions (Signed)
Heart Failure Heart failure means your heart has trouble pumping blood. This makes it hard for your body to work well. Heart failure is usually a long-term (chronic) condition. You must take good care of yourself and follow your doctor's treatment plan. HOME CARE  Take your heart medicine as told by your doctor.  Do not stop taking medicine unless your doctor tells you to.  Do not skip any dose of medicine.  Refill your medicines before they run out.  Take other medicines only as told by your doctor or pharmacist.  Stay active if told by your doctor. The elderly and people with severe heart failure should talk with a doctor about physical activity.  Eat heart-healthy foods. Choose foods that are without trans fat and are low in saturated fat, cholesterol, and salt (sodium). This includes fresh or frozen fruits and vegetables, fish, lean meats, fat-free or low-fat dairy foods, whole grains, and high-fiber foods. Lentils and dried peas and beans (legumes) are also good choices.  Limit salt if told by your doctor.  Cook in a healthy way. Roast, grill, broil, bake, poach, steam, or stir-fry foods.  Limit fluids as told by your doctor.  Weigh yourself every morning. Do this after you pee (urinate) and before you eat breakfast. Write down your weight to give to your doctor.  Take your blood pressure and write it down if your doctor tells you to.  Ask your doctor how to check your pulse. Check your pulse as told.  Lose weight if told by your doctor.  Stop smoking or chewing tobacco. Do not use gum or patches that help you quit without your doctor's approval.  Schedule and go to doctor visits as told.  Nonpregnant women should have no more than 1 drink a day. Men should have no more than 2 drinks a day. Talk to your doctor about drinking alcohol.  Stop illegal drug use.  Stay current with shots (immunizations).  Manage your health conditions as told by your doctor.  Learn to  manage your stress.  Rest when you are tired.  If it is really hot outside:  Avoid intense activities.  Use air conditioning or fans, or get in a cooler place.  Avoid caffeine and alcohol.  Wear loose-fitting, lightweight, and light-colored clothing.  If it is really cold outside:  Avoid intense activities.  Layer your clothing.  Wear mittens or gloves, a hat, and a scarf when going outside.  Avoid alcohol.  Learn about heart failure and get support as needed.  Get help to maintain or improve your quality of life and your ability to care for yourself as needed. GET HELP IF:   You gain 03 lb/1.4 kg or more in 1 day or 05 lb/2.3 kg in a week.  You are more short of breath than usual.  You cannot do your normal activities.  You tire easily.  You cough more than normal, especially with activity.  You have any or more puffiness (swelling) in areas such as your hands, feet, ankles, or belly (abdomen).  You cannot sleep because it is hard to breathe.  You feel like your heart is beating fast (palpitations).  You get dizzy or light-headed when you stand up. GET HELP RIGHT AWAY IF:   You have trouble breathing.  There is a change in mental status, such as becoming less alert or not being able to focus.  You have chest pain or discomfort.  You faint. MAKE SURE YOU:   Understand these instructions.  Will watch your condition.  Will get help right away if you are not doing well or get worse. Document Released: 12/14/2007 Document Revised: 07/21/2013 Document Reviewed: 04/22/2012 [facility] Patient Information 2015 [place], [place]. This information is not intended to replace advice given to you by your health care provider. Make sure you discuss any questions you have with your health care provider.  Low-Sodium Eating Plan Sodium raises blood pressure and causes water to be held in the body. Getting less sodium from food will help lower your blood pressure, reduce  any swelling, and protect your heart, liver, and kidneys. We get sodium by adding salt (sodium chloride) to food. Most of our sodium comes from canned, boxed, and frozen foods. Restaurant foods, fast foods, and pizza are also very high in sodium. Even if you take medicine to lower your blood pressure or to reduce fluid in your body, getting less sodium from your food is important. WHAT IS MY PLAN? Most people should limit their sodium intake to 2,300 mg a day. Your health care provider recommends that you limit your sodium intake to __________ a day.  WHAT DO I NEED TO KNOW ABOUT THIS EATING PLAN? For the low-sodium eating plan, you will follow these general guidelines:  Choose foods with a % Daily Value for sodium of less than 5% (as listed on the food label).   Use salt-free seasonings or herbs instead of table salt or sea salt.   Check with your health care provider or pharmacist before using salt substitutes.   Eat fresh foods.  Eat more vegetables and fruits.  Limit canned vegetables. If you do use them, rinse them well to decrease the sodium.   Limit cheese to 1 oz (28 g) per day.   Eat lower-sodium products, often labeled as "lower sodium" or "no salt added."  Avoid foods that contain monosodium glutamate (MSG). MSG is sometimes added to [place] food and some canned foods.  Check food labels (Nutrition Facts labels) on foods to learn how much sodium is in one serving.  Eat more home-cooked food and less restaurant, buffet, and fast food.  When eating at a restaurant, ask that your food be prepared with less salt or none, if possible.  HOW DO I READ FOOD LABELS FOR SODIUM INFORMATION? The Nutrition Facts label lists the amount of sodium in one serving of the food. If you eat more than one serving, you must multiply the listed amount of sodium by the number of servings. Food labels may also identify foods as:  Sodium free--Less than 5 mg in a serving.  Very low  sodium--35 mg or less in a serving.  Low sodium--140 mg or less in a serving.  Light in sodium--50% less sodium in a serving. For example, if a food that usually has 300 mg of sodium is changed to become light in sodium, it will have 150 mg of sodium.  Reduced sodium--25% less sodium in a serving. For example, if a food that usually has 400 mg of sodium is changed to reduced sodium, it will have 300 mg of sodium. WHAT FOODS CAN I EAT? Grains Low-sodium cereals, including oats, puffed wheat and rice, and shredded wheat cereals. Low-sodium crackers. Unsalted rice and pasta. Lower-sodium bread.  Vegetables Frozen or fresh vegetables. Low-sodium or reduced-sodium canned vegetables. Low-sodium or reduced-sodium tomato sauce and paste. Low-sodium or reduced-sodium tomato and vegetable juices.  Fruits Fresh, frozen, and canned fruit. Fruit juice.  Meat and Other Protein Products Low-sodium canned tuna and salmon.  Fresh or frozen meat, poultry, seafood, and fish. Lamb. Unsalted nuts. Dried beans, peas, and lentils without added salt. Unsalted canned beans. Homemade soups without salt. Eggs.  Dairy Milk. Soy milk. Ricotta cheese. Low-sodium or reduced-sodium cheeses. Yogurt.  Condiments Fresh and dried herbs and spices. Salt-free seasonings. Onion and garlic powders. Low-sodium varieties of mustard and ketchup. Lemon juice.  Fats and Oils Reduced-sodium salad dressings. Unsalted butter.  Other Unsalted popcorn and pretzels.  The items listed above may not be a complete list of recommended foods or beverages. Contact your dietitian for more options. WHAT FOODS ARE NOT RECOMMENDED? Grains Instant hot cereals. Bread stuffing, pancake, and biscuit mixes. Croutons. Seasoned rice or pasta mixes. Noodle soup cups. Boxed or frozen macaroni and cheese. Self-rising flour. Regular salted crackers. Vegetables Regular canned vegetables. Regular canned tomato sauce and paste. Regular tomato and  vegetable juices. Frozen vegetables in sauces. Salted french fries. Olives. Angie Fava. Relishes. Sauerkraut. Salsa. Meat and Other Protein Products Salted, canned, smoked, spiced, or pickled meats, seafood, or fish. Bacon, ham, sausage, hot dogs, corned beef, chipped beef, and packaged luncheon meats. Salt pork. Jerky. Pickled herring. Anchovies, regular canned tuna, and sardines. Salted nuts. Dairy Processed cheese and cheese spreads. Cheese curds. Blue cheese and cottage cheese. Buttermilk.  Condiments Onion and garlic salt, seasoned salt, table salt, and sea salt. Canned and packaged gravies. Worcestershire sauce. Tartar sauce. Barbecue sauce. Teriyaki sauce. Soy sauce, including reduced sodium. Steak sauce. Fish sauce. Oyster sauce. Cocktail sauce. Horseradish. Regular ketchup and mustard. Meat flavorings and tenderizers. Bouillon cubes. Hot sauce. Tabasco sauce. Marinades. Taco seasonings. Relishes. Fats and Oils Regular salad dressings. Salted butter. Margarine. Ghee. Bacon fat.  Other Potato and tortilla chips. Corn chips and puffs. Salted popcorn and pretzels. Canned or dried soups. Pizza. Frozen entrees and pot pies.  The items listed above may not be a complete list of foods and beverages to avoid. Contact your dietitian for more information. Document Released: 08/26/2001 Document Revised: 03/11/2013 Document Reviewed: 01/08/2013 [facility] Patient Information 2015 [place], [place]. This information is not intended to replace advice given to you by your health care provider. Make sure you discuss any questions you have with your health care provider.

## 2014-10-31 NOTE — Progress Notes (Signed)
Late entry: assumed care from off going RN; patient alert & oriented times three; patient ambulates in room with no signs of distress; patient asked for taxi voucher but aware that it can't be provided at this time; discharge instructions given; waiting for ride home

## 2014-11-03 ENCOUNTER — Encounter: Payer: Self-pay | Admitting: Vascular Surgery

## 2014-11-04 ENCOUNTER — Inpatient Hospital Stay (HOSPITAL_COMMUNITY): Admission: RE | Admit: 2014-11-04 | Payer: Medicare Other | Source: Ambulatory Visit

## 2014-11-04 ENCOUNTER — Encounter: Payer: Medicare Other | Admitting: Vascular Surgery

## 2014-11-07 ENCOUNTER — Emergency Department (HOSPITAL_COMMUNITY)
Admission: EM | Admit: 2014-11-07 | Discharge: 2014-11-08 | Disposition: A | Discharge status: Home / Self Care | Payer: Medicare Other | Attending: Emergency Medicine | Admitting: Emergency Medicine

## 2014-11-07 DIAGNOSIS — Z79899 Other long term (current) drug therapy: Secondary | ICD-10-CM | POA: Insufficient documentation

## 2014-11-07 DIAGNOSIS — I252 Old myocardial infarction: Secondary | ICD-10-CM | POA: Diagnosis not present

## 2014-11-07 DIAGNOSIS — F1410 Cocaine abuse, uncomplicated: Secondary | ICD-10-CM | POA: Diagnosis not present

## 2014-11-07 DIAGNOSIS — G8929 Other chronic pain: Secondary | ICD-10-CM | POA: Diagnosis not present

## 2014-11-07 DIAGNOSIS — M79605 Pain in left leg: Secondary | ICD-10-CM | POA: Diagnosis present

## 2014-11-07 DIAGNOSIS — R109 Unspecified abdominal pain: Secondary | ICD-10-CM | POA: Diagnosis not present

## 2014-11-07 DIAGNOSIS — I5042 Chronic combined systolic (congestive) and diastolic (congestive) heart failure: Secondary | ICD-10-CM | POA: Insufficient documentation

## 2014-11-07 DIAGNOSIS — G4733 Obstructive sleep apnea (adult) (pediatric): Secondary | ICD-10-CM | POA: Insufficient documentation

## 2014-11-07 DIAGNOSIS — N183 Chronic kidney disease, stage 3 (moderate): Secondary | ICD-10-CM | POA: Insufficient documentation

## 2014-11-07 DIAGNOSIS — F319 Bipolar disorder, unspecified: Secondary | ICD-10-CM | POA: Diagnosis not present

## 2014-11-07 DIAGNOSIS — I25119 Atherosclerotic heart disease of native coronary artery with unspecified angina pectoris: Secondary | ICD-10-CM | POA: Diagnosis not present

## 2014-11-07 DIAGNOSIS — I129 Hypertensive chronic kidney disease with stage 1 through stage 4 chronic kidney disease, or unspecified chronic kidney disease: Secondary | ICD-10-CM | POA: Diagnosis not present

## 2014-11-07 DIAGNOSIS — M545 Low back pain: Secondary | ICD-10-CM | POA: Insufficient documentation

## 2014-11-07 DIAGNOSIS — M1990 Unspecified osteoarthritis, unspecified site: Secondary | ICD-10-CM | POA: Diagnosis not present

## 2014-11-07 DIAGNOSIS — J449 Chronic obstructive pulmonary disease, unspecified: Secondary | ICD-10-CM | POA: Insufficient documentation

## 2014-11-07 DIAGNOSIS — D509 Iron deficiency anemia, unspecified: Secondary | ICD-10-CM | POA: Insufficient documentation

## 2014-11-07 DIAGNOSIS — Z87891 Personal history of nicotine dependence: Secondary | ICD-10-CM | POA: Diagnosis not present

## 2014-11-07 DIAGNOSIS — Z9981 Dependence on supplemental oxygen: Secondary | ICD-10-CM | POA: Insufficient documentation

## 2014-11-07 DIAGNOSIS — E785 Hyperlipidemia, unspecified: Secondary | ICD-10-CM | POA: Insufficient documentation

## 2014-11-07 DIAGNOSIS — G40909 Epilepsy, unspecified, not intractable, without status epilepticus: Secondary | ICD-10-CM | POA: Insufficient documentation

## 2014-11-07 DIAGNOSIS — Z8701 Personal history of pneumonia (recurrent): Secondary | ICD-10-CM | POA: Diagnosis not present

## 2014-11-07 DIAGNOSIS — R079 Chest pain, unspecified: Secondary | ICD-10-CM | POA: Insufficient documentation

## 2014-11-07 DIAGNOSIS — Z7982 Long term (current) use of aspirin: Secondary | ICD-10-CM | POA: Insufficient documentation

## 2014-11-07 DIAGNOSIS — Z859 Personal history of malignant neoplasm, unspecified: Secondary | ICD-10-CM | POA: Diagnosis not present

## 2014-11-07 LAB — CBC
HEMATOCRIT: 27.4 % — AB (ref 39.0–52.0)
HEMOGLOBIN: 8.4 g/dL — AB (ref 13.0–17.0)
MCH: 26.3 pg (ref 26.0–34.0)
MCHC: 30.7 g/dL (ref 30.0–36.0)
MCV: 85.6 fL (ref 78.0–100.0)
Platelets: 331 10*3/uL (ref 150–400)
RBC: 3.2 MIL/uL — AB (ref 4.22–5.81)
RDW: 16.5 % — ABNORMAL HIGH (ref 11.5–15.5)
WBC: 5.2 10*3/uL (ref 4.0–10.5)

## 2014-11-07 LAB — I-STAT TROPONIN, ED: TROPONIN I, POC: 0.07 ng/mL (ref 0.00–0.08)

## 2014-11-07 LAB — COMPREHENSIVE METABOLIC PANEL
ALBUMIN: 3.2 g/dL — AB (ref 3.5–5.0)
ALK PHOS: 88 U/L (ref 38–126)
ALT: 20 U/L (ref 17–63)
ANION GAP: 9 (ref 5–15)
AST: 44 U/L — ABNORMAL HIGH (ref 15–41)
BUN: 25 mg/dL — ABNORMAL HIGH (ref 6–20)
CALCIUM: 8.9 mg/dL (ref 8.9–10.3)
CO2: 21 mmol/L — ABNORMAL LOW (ref 22–32)
Chloride: 108 mmol/L (ref 101–111)
Creatinine, Ser: 1.14 mg/dL (ref 0.61–1.24)
GFR calc non Af Amer: >60 mL/min (ref >60)
GLUCOSE: 94 mg/dL (ref 65–99)
POTASSIUM: 4.2 mmol/L (ref 3.5–5.1)
SODIUM: 138 mmol/L (ref 135–145)
Total Bilirubin: 0.5 mg/dL (ref 0.3–1.2)
Total Protein: 7.3 g/dL (ref 6.5–8.1)

## 2014-11-07 LAB — LIPASE, BLOOD: Lipase: 42 U/L (ref 22–51)

## 2014-11-07 MED ORDER — ONDANSETRON 4 MG PO TBDP
ORAL_TABLET | ORAL | Status: AC
  Filled 2014-11-07: qty 1

## 2014-11-07 NOTE — ED Notes (Signed)
Patient began to complain of chest pain after lab draw and notifiction that he will placed in waiting area.

## 2014-11-07 NOTE — ED Notes (Signed)
Patient here via EMS with complaint of left lower back pain and leg pain. Left leg pain is chronic for him. Reports hot and cold chills persisting through today.

## 2014-11-07 NOTE — ED Notes (Signed)
Pt chatting in room, relaxed,comfortable in the bed.

## 2014-11-07 NOTE — ED Notes (Addendum)
When this RN came in to the room pt was talking, comfortable, no complaints or distress.  When registration stepped out pt immediately began to write in pain, and contort himself in the bed.  Initially pt did not want this RN to assess him as "You aren't the one who can help me.  You don't have any medicines in your hand".  This RN explained the importance of the assessment, and pt allowed her to continue.  Pt unable to keep train of thought during assessment.  Pt prefers not to answer RN's questions, but wants to continue to tell his story.  This patient unable to recall details accurately, needing to restart his story multiple times. Pt continues to add concerns to his list of complaints.  Pt states his leg "gave out" this morning and he fell walking to the bathroom.  Denies any pain immediately after.  Then states he was on the front porch this afternoon and became diaphoretic and dizzy and short of breath and began to have leg pain that began in his left foot and radiated up to his left hip and lower back.  When he arrived here and was in the lobby waiting pt sts he vomited "blood" (pt also sts he was unable to really see in the bag well) and began to have chest pain.  Pt sts he is still dizzy and unable to ambulate independently.

## 2014-11-07 NOTE — ED Notes (Signed)
Pt requested the results of lab work and wants to know when he will see the Dr

## 2014-11-08 ENCOUNTER — Emergency Department (HOSPITAL_COMMUNITY)
Admission: EM | Admit: 2014-11-08 | Discharge: 2014-11-09 | Disposition: A | Discharge status: Home / Self Care | Payer: Medicare Other | Attending: Emergency Medicine | Admitting: Emergency Medicine

## 2014-11-08 ENCOUNTER — Encounter (HOSPITAL_COMMUNITY): Payer: Self-pay | Admitting: Emergency Medicine

## 2014-11-08 ENCOUNTER — Emergency Department (HOSPITAL_COMMUNITY): Payer: Medicare Other

## 2014-11-08 DIAGNOSIS — I252 Old myocardial infarction: Secondary | ICD-10-CM | POA: Diagnosis not present

## 2014-11-08 DIAGNOSIS — Z87891 Personal history of nicotine dependence: Secondary | ICD-10-CM | POA: Insufficient documentation

## 2014-11-08 DIAGNOSIS — N183 Chronic kidney disease, stage 3 (moderate): Secondary | ICD-10-CM | POA: Diagnosis not present

## 2014-11-08 DIAGNOSIS — I129 Hypertensive chronic kidney disease with stage 1 through stage 4 chronic kidney disease, or unspecified chronic kidney disease: Secondary | ICD-10-CM | POA: Insufficient documentation

## 2014-11-08 DIAGNOSIS — J449 Chronic obstructive pulmonary disease, unspecified: Secondary | ICD-10-CM | POA: Insufficient documentation

## 2014-11-08 DIAGNOSIS — M79605 Pain in left leg: Secondary | ICD-10-CM | POA: Insufficient documentation

## 2014-11-08 DIAGNOSIS — I5042 Chronic combined systolic (congestive) and diastolic (congestive) heart failure: Secondary | ICD-10-CM | POA: Diagnosis not present

## 2014-11-08 DIAGNOSIS — E785 Hyperlipidemia, unspecified: Secondary | ICD-10-CM | POA: Diagnosis not present

## 2014-11-08 DIAGNOSIS — F319 Bipolar disorder, unspecified: Secondary | ICD-10-CM | POA: Diagnosis not present

## 2014-11-08 DIAGNOSIS — Z7951 Long term (current) use of inhaled steroids: Secondary | ICD-10-CM | POA: Insufficient documentation

## 2014-11-08 DIAGNOSIS — Z859 Personal history of malignant neoplasm, unspecified: Secondary | ICD-10-CM | POA: Insufficient documentation

## 2014-11-08 DIAGNOSIS — Z8701 Personal history of pneumonia (recurrent): Secondary | ICD-10-CM | POA: Insufficient documentation

## 2014-11-08 DIAGNOSIS — R079 Chest pain, unspecified: Secondary | ICD-10-CM | POA: Insufficient documentation

## 2014-11-08 DIAGNOSIS — Z951 Presence of aortocoronary bypass graft: Secondary | ICD-10-CM | POA: Diagnosis not present

## 2014-11-08 DIAGNOSIS — G40909 Epilepsy, unspecified, not intractable, without status epilepticus: Secondary | ICD-10-CM | POA: Diagnosis not present

## 2014-11-08 DIAGNOSIS — I25119 Atherosclerotic heart disease of native coronary artery with unspecified angina pectoris: Secondary | ICD-10-CM | POA: Insufficient documentation

## 2014-11-08 DIAGNOSIS — G8929 Other chronic pain: Secondary | ICD-10-CM | POA: Diagnosis not present

## 2014-11-08 DIAGNOSIS — Z7982 Long term (current) use of aspirin: Secondary | ICD-10-CM | POA: Diagnosis not present

## 2014-11-08 DIAGNOSIS — M79602 Pain in left arm: Secondary | ICD-10-CM

## 2014-11-08 DIAGNOSIS — Q2733 Arteriovenous malformation of digestive system vessel: Secondary | ICD-10-CM | POA: Diagnosis not present

## 2014-11-08 DIAGNOSIS — M1990 Unspecified osteoarthritis, unspecified site: Secondary | ICD-10-CM | POA: Diagnosis not present

## 2014-11-08 DIAGNOSIS — Z79899 Other long term (current) drug therapy: Secondary | ICD-10-CM | POA: Insufficient documentation

## 2014-11-08 DIAGNOSIS — D509 Iron deficiency anemia, unspecified: Secondary | ICD-10-CM | POA: Diagnosis not present

## 2014-11-08 DIAGNOSIS — F1410 Cocaine abuse, uncomplicated: Secondary | ICD-10-CM | POA: Diagnosis not present

## 2014-11-08 LAB — URINALYSIS, ROUTINE W REFLEX MICROSCOPIC
Glucose, UA: NEGATIVE mg/dL
HGB URINE DIPSTICK: NEGATIVE
Ketones, ur: NEGATIVE mg/dL
Leukocytes, UA: NEGATIVE
Nitrite: NEGATIVE
Protein, ur: >300 mg/dL — AB
SPECIFIC GRAVITY, URINE: 1.025 (ref 1.005–1.030)
UROBILINOGEN UA: 1 mg/dL (ref 0.0–1.0)
pH: 5 (ref 5.0–8.0)

## 2014-11-08 LAB — RAPID URINE DRUG SCREEN, HOSP PERFORMED
Amphetamines: NOT DETECTED
Barbiturates: NOT DETECTED
Benzodiazepines: NOT DETECTED
COCAINE: POSITIVE — AB
OPIATES: NOT DETECTED
TETRAHYDROCANNABINOL: NOT DETECTED

## 2014-11-08 LAB — URINE MICROSCOPIC-ADD ON

## 2014-11-08 LAB — I-STAT CHEM 8, ED
BUN: 36 mg/dL — ABNORMAL HIGH (ref 6–20)
CALCIUM ION: 1.16 mmol/L (ref 1.13–1.30)
CHLORIDE: 108 mmol/L (ref 101–111)
CREATININE: 1.5 mg/dL — AB (ref 0.61–1.24)
GLUCOSE: 98 mg/dL (ref 65–99)
HCT: 29 % — ABNORMAL LOW (ref 39.0–52.0)
HEMOGLOBIN: 9.9 g/dL — AB (ref 13.0–17.0)
POTASSIUM: 4 mmol/L (ref 3.5–5.1)
Sodium: 139 mmol/L (ref 135–145)
TCO2: 16 mmol/L (ref 0–100)

## 2014-11-08 LAB — I-STAT TROPONIN, ED
TROPONIN I, POC: 0.05 ng/mL (ref 0.00–0.08)
Troponin i, poc: 0.06 ng/mL (ref 0.00–0.08)

## 2014-11-08 MED ORDER — KETOROLAC TROMETHAMINE 60 MG/2ML IM SOLN
60.0000 mg | Freq: Once | INTRAMUSCULAR | Status: AC
  Administered 2014-11-08: 60 mg via INTRAMUSCULAR
  Filled 2014-11-08: qty 2

## 2014-11-08 MED ORDER — HALOPERIDOL LACTATE 5 MG/ML IJ SOLN
2.0000 mg | Freq: Once | INTRAMUSCULAR | Status: AC
  Administered 2014-11-08: 2 mg via INTRAMUSCULAR
  Filled 2014-11-08: qty 1

## 2014-11-08 NOTE — ED Notes (Signed)
Called to patient bedside for complaint of "I can't breathe". O2 saturation noted to be 100% on room air. Patient requested oxygen to be applied. This RN placed cannula in patient's nares and patient immediately calmed and appeared to be more relaxed. At that time cannula was applied but O2 was not flowing. O2 remains off with cannula in place. Patient continues to request pain medication, patient reports that he is allergic to both tylenol and ibuprofen. MD informed. Monitoring continues.

## 2014-11-08 NOTE — ED Notes (Signed)
Patient here with complaint of chest pain and leg pain. States similar to pain yesterday. Reports that medications that were given to him last night were very helpful. Meds that were given included haldol and toradol.

## 2014-11-08 NOTE — ED Notes (Signed)
MD Sabra Heck informed that patient is requesting medications that were given yesterday. MD advises that patient may have tylenol or ibuprofen.

## 2014-11-08 NOTE — ED Notes (Signed)
When this RN went in the room to give pt his medications patient states he is certain those medications will not work for him.  This RN asked the patient why.  Pt sts "I just know".  This RN encouraged patient to try both medications and allow them to work together.  Pt continued to argue with RN, stating he doesn't like needles.  This RN offered to have MD come talk to him about other options and began to leave. Pt then states he is willing to try medications.

## 2014-11-08 NOTE — ED Provider Notes (Signed)
CSN: [id number]     Arrival date & time 11/07/14  1948 History  This chart was scribed for Elnore Cosens, MD by Evelene Croon, ED Scribe. This patient was seen in room D34C/D34C and the patient's care was started 12:21 AM.    Chief Complaint  Patient presents with  . Leg Pain  . Abdominal Pain  . Chest Pain    Patient is a 69 y.o. male presenting with leg pain and chest pain. The history is provided by the patient. No language interpreter was used.  Leg Pain Location:  Leg and ankle Injury: no   Leg location:  L leg Ankle location:  L ankle Pain details:    Quality:  Aching   Radiates to:  Back   Severity:  Moderate   Onset quality:  Gradual   Timing:  Constant   Progression:  Unchanged Chronicity:  New Dislocation: no   Foreign body present:  No foreign bodies Relieved by:  Nothing Worsened by:  Nothing tried Ineffective treatments:  None tried Associated symptoms: back pain   Associated symptoms: no decreased ROM, no muscle weakness, no neck pain, no numbness, no swelling and no tingling   Risk factors: no obesity   Chest Pain Associated symptoms: back pain   Associated symptoms: no abdominal pain, no numbness, no palpitations and no weakness      HPI Comments:  Charles [name] is a 69 y.o. male with a history of HTN, CAD, anemia and polysubstance abuse who presents to the Emergency Department complaining of moderate LLE pain for 1 day. He states his pain is constant and that it runs from his left ankle up to his left hip and lower back. Pt also complains of SOB that started PTA, CP that started upon arrival to the ED, nausea, intermittent abdominal pain and dizziness. No alleviating factors noted. No fever.  No PCP  Cardiologist: Sherrian Divers  Past Medical History  Diagnosis Date  . Iron deficiency anemia   . Chronic combined systolic and diastolic CHF, NYHA class 2     a. 11/2013 Echo: EF 40-45%, basl-mid inflat AK, Gr 2 DD.  [name] Hyperlipidemia LDL goal <70   . Ischemic  cardiomyopathy     a. 11/2013 Echo: EF 40-45%, basl-mid inflat AK, Gr 2 DD, mild AI, mod dil LA/RA, mod reduced RV fxn, PASP 51mmHg.  . Moderate to severe pulmonary hypertension 03/2010    a. PA peak pressure of 76 mmHg (per 2D Echo 03/2010)  . Polysubstance abuse     a. cocaine, THC  . AV malformation of gastrointestinal tract     a. w/ h/o GIB.  [name] Family history of early CAD   . Peripheral vascular disease   . Seizure disorder   . Hypertension   . CAD (coronary artery disease)     a. s/p 3-vessel CABG (12/2007) // 100% RCA stenosis with collaterals from left system. Severe bifurcation lesions of proxima CXA and OM. Moderate LAD disease - followed by Dr. Wyline Copas in [facility];  b. 11/2013 Myoview: Large fixed inf defect w/o ischemia, EF 35%.  . Chronic kidney disease (CKD), stage III (moderate)     a. BL SCr 1.5-1.6  . Renal artery stenosis   . Cancer     "I have cancer; right now it's not known what kind" (09/17/2014)  . Myocardial infarction 08/2007; 12/2007  . Anginal pain   . COPD (chronic obstructive pulmonary disease)   . Pneumonia     "I've had pneumonia once" (09/17/2014)  .  Obstructive sleep apnea     "never told me to wear mask" (09/17/2014)  . History of blood transfusion "I've had quite a few"    "they don't know where it's coming from" (09/17/2014)  . History of hiatal hernia   . Seizures     "I've had a few; don't know what kind they call it" (09/17/2014)  . DDD (degenerative disc disease), lumbar   . Arthritis     "ankles; left hip; wrists" (09/17/2014)  . Chronic lower back pain   . Depression   . Bipolar disorder   . Schizophrenia   . BPH (benign prostatic hyperplasia)    Past Surgical History  Procedure Laterality Date  . Apc  03/2010    To treat small bowel AVMs  . Esophagogastroduodenoscopy N/A 08/14/2012    Procedure: ESOPHAGOGASTRODUODENOSCOPY (EGD);  Surgeon: [name], MD;  Location: [facility] ENDOSCOPY;  Service: Endoscopy;  Laterality: N/A;  . Hot hemostasis N/A  08/14/2012    Procedure: HOT HEMOSTASIS (ARGON PLASMA COAGULATION/BICAP);  Surgeon: [name], MD;  Location: [facility] ENDOSCOPY;  Service: Endoscopy;  Laterality: N/A;  . Esophagogastroduodenoscopy (egd) with propofol N/A 08/04/2013    Procedure: ESOPHAGOGASTRODUODENOSCOPY (EGD) WITH PROPOFOL;  Surgeon: [name], MD;  Location: [facility] ENDOSCOPY;  Service: Endoscopy;  Laterality: N/A;  . Esophagogastroduodenoscopy (egd) with propofol N/A 06/08/2014    Procedure: ESOPHAGOGASTRODUODENOSCOPY (EGD) WITH PROPOFOL;  Surgeon: Milus Banister, MD;  Location: [facility];  Service: Endoscopy;  Laterality: N/A;  . Peripheral vascular catheterization N/A 09/07/2014    Procedure: Abdominal Aortogram;  Surgeon: Angelia Mould, MD;  Location: [place] CV LAB;  Service: Cardiovascular;  Laterality: N/A;  . Coronary artery bypass graft  12/2007    "CABG X3"  . Coronary angioplasty with stent placement  08/2007  . Enteroscopy N/A 10/16/2014    Procedure: ENTEROSCOPY;  Surgeon: [name], MD;  Location: [facility] ENDOSCOPY;  Service: Endoscopy;  Laterality: N/A;   Family History  Problem Relation Age of Onset  . Heart disease Mother     unknown type  . Heart disease Father 43    died of MI at 53yo  . Heart disease Paternal Grandfather 16    died of MI  . Heart disease    . Heart disease Brother 56   Social History  Substance Use Topics  . Smoking status: Former Smoker -- 0.25 packs/day for 10 years    Types: Cigarettes  . Smokeless tobacco: Never Used     Comment: "quit smoking cigarettes in 2009; smoked off and on since I was 13"  . Alcohol Use: No     Comment: 09/17/2014 "maybe 4 beers/month, 12 oz cans"    Review of Systems  Respiratory: Negative for wheezing and stridor.   Cardiovascular: Positive for chest pain. Negative for palpitations and leg swelling.  Gastrointestinal: Negative for abdominal pain.  Musculoskeletal: Positive for back pain. Negative for joint swelling, gait problem and  neck pain. Myalgias: LLE pain.  Neurological: Negative for weakness and numbness.  All other systems reviewed and are negative.     Allergies  Motrin and Tylenol  Home Medications   Prior to Admission medications   Medication Sig Start Date End Date Taking? Authorizing Provider  albuterol (PROVENTIL HFA;VENTOLIN HFA) 108 (90 BASE) MCG/ACT inhaler Inhale 2 puffs into the lungs every 6 (six) hours as needed for wheezing or shortness of breath. 04/30/14   Thurnell Lose, MD  amLODipine (NORVASC) 10 MG tablet Take 1 tablet (10 mg total) by  mouth daily. 09/18/14   Shanker Kristeen Mans, MD  aspirin EC 81 MG tablet Take 1 tablet (81 mg total) by mouth daily. RESUME AFTER 1 WEEK. 10/18/14   Bonnielee Haff, MD  atorvastatin (LIPITOR) 80 MG tablet Take 1 tablet (80 mg total) by mouth daily at 6 PM. 10/07/14   Charlynne Cousins, MD  budesonide-formoterol [facility]) 80-4.5 MCG/ACT inhaler Inhale 2 puffs into the lungs 2 (two) times daily. 09/08/14   Ripudeep Krystal Eaton, MD  citalopram (CELEXA) 20 MG tablet Take 20 mg by mouth daily.    Historical Provider, MD  diclofenac sodium (VOLTAREN) 1 % GEL Apply 4 g topically 4 (four) times daily. 09/22/14   Eddy Termine, MD  divalproex (DEPAKOTE ER) 500 MG 24 hr tablet Take 500 mg by mouth 2 (two) times daily. 05/29/14   Historical Provider, MD  ferrous sulfate 325 (65 FE) MG tablet Take 325 mg by mouth 2 (two) times daily with a meal.     Historical Provider, MD  hydrALAZINE (APRESOLINE) 100 MG tablet Take 100 mg by mouth 3 (three) times daily. 05/29/14   Historical Provider, MD  HYDROcodone-acetaminophen (NORCO/VICODIN) 5-325 MG per tablet Take 1-2 tablets by mouth every 6 (six) hours as needed for moderate pain. 10/18/14   Bonnielee Haff, MD  isosorbide mononitrate (IMDUR) 30 MG 24 hr tablet Take 2 tablets (60 mg total) by mouth daily. 07/26/14   Modena Jansky, MD  metolazone (ZAROXOLYN) 5 MG tablet Take 1 tablet (5 mg total) by mouth as needed (for weight gain of 2 lbs  in 2-3 days). 10/18/14   Bonnielee Haff, MD  nitroGLYCERIN (NITROSTAT) 0.4 MG SL tablet Place 1 tablet (0.4 mg total) under the tongue every 5 (five) minutes as needed for chest pain (upto max 3 doses at one time.). Patient not taking: Reported on 10/15/2014 07/26/14   Modena Jansky, MD  pantoprazole (PROTONIX) 40 MG tablet Take 40 mg by mouth daily.     Historical Provider, MD  phenytoin (DILANTIN) 200 MG ER capsule Take 1 capsule (200 mg total) by mouth 2 (two) times daily. 10/31/14   Janece Canterbury, MD  potassium chloride SA (KLOR-CON M20) 20 MEQ tablet Take 20 mEq by mouth daily. 01/17/10   Historical Provider, MD  torsemide (DEMADEX) 20 MG tablet Take 2 tablets (40 mg total) by mouth daily. 10/18/14   Bonnielee Haff, MD   BP 160/108 mmHg  Pulse 99  Temp(Src) 97.9 F (36.6 C) (Oral)  Resp 18  Ht 5\' 5"  (1.651 m)  Wt 163 lb (73.936 kg)  BMI 27.12 kg/m2  SpO2 97% Physical Exam  Constitutional: He is oriented to person, place, and time. He appears well-developed and well-nourished. No distress.  HENT:  Head: Normocephalic and atraumatic.  Mouth/Throat: Oropharynx is clear and moist.  Moist Mucous Membranes  Eyes: Conjunctivae are normal. Pupils are equal, round, and reactive to light.  Neck: Normal range of motion. Neck supple. No tracheal deviation present.  Trachea Midline  Cardiovascular: Normal rate, regular rhythm, normal heart sounds and intact distal pulses.   Pulmonary/Chest: Effort normal and breath sounds normal. No respiratory distress. He has no wheezes. He has no rales.  No Rhonci  Abdominal: Soft. Bowel sounds are normal. He exhibits no distension. There is no tenderness. There is no rebound and no guarding.  Musculoskeletal: Normal range of motion. He exhibits no edema or tenderness.  Pt wearing right ankle monitor  Neurological: He is alert and oriented to person, place, and time. He has  normal reflexes.  Skin: Skin is warm and dry.  Psychiatric: He has a normal mood  and affect. His behavior is normal.  Nursing note and vitals reviewed.   ED Course  Procedures   DIAGNOSTIC STUDIES:  Oxygen Saturation is 97% on RA, normal by my interpretation.    COORDINATION OF CARE:  12:27 AM Discussed treatment plan with pt at bedside and pt agreed to plan.  Labs Review Labs Reviewed  COMPREHENSIVE METABOLIC PANEL - Abnormal; Notable for the following:    CO2 21 (*)    BUN 25 (*)    Albumin 3.2 (*)    AST 44 (*)    All other components within normal limits  CBC - Abnormal; Notable for the following:    RBC 3.20 (*)    Hemoglobin 8.4 (*)    HCT 27.4 (*)    RDW 16.5 (*)    All other components within normal limits  LIPASE, BLOOD  URINALYSIS, ROUTINE W REFLEX MICROSCOPIC (NOT AT [facility])  I-STAT TROPOININ, ED    Imaging Review No results found. I have personally reviewed and evaluated these images and lab results as part of my medical decision-making.   EKG Interpretation   Date/Time:  Saturday November 07 2014 20:37:01 EDT Ventricular Rate:  98 PR Interval:  180 QRS Duration: 110 QT Interval:  398 QTC Calculation: 508 R Axis:   73 Text Interpretation:  Sinus rhythm with occasional Premature ventricular  complexes Cannot rule out Inferior infarct , age undetermined Prolonged QT  Confirmed by [facility]  MD, Tierra Divelbiss ([id number]) on 11/08/2014 12:14:46 AM      MDM   Final diagnoses:  None    Results for orders placed or performed during the hospital encounter of 11/07/14  Lipase, blood  Result Value Ref Range   Lipase 42 22 - 51 U/L  Comprehensive metabolic panel  Result Value Ref Range   Sodium 138 135 - 145 mmol/L   Potassium 4.2 3.5 - 5.1 mmol/L   Chloride 108 101 - 111 mmol/L   CO2 21 (L) 22 - 32 mmol/L   Glucose, Bld 94 65 - 99 mg/dL   BUN 25 (H) 6 - 20 mg/dL   Creatinine, Ser 1.14 0.61 - 1.24 mg/dL   Calcium 8.9 8.9 - 10.3 mg/dL   Total Protein 7.3 6.5 - 8.1 g/dL   Albumin 3.2 (L) 3.5 - 5.0 g/dL   AST 44 (H) 15 - 41 U/L   ALT  20 17 - 63 U/L   Alkaline Phosphatase 88 38 - 126 U/L   Total Bilirubin 0.5 0.3 - 1.2 mg/dL   GFR calc non Af Amer >60 >60 mL/min   GFR calc Af Amer >60 >60 mL/min   Anion gap 9 5 - 15  CBC  Result Value Ref Range   WBC 5.2 4.0 - 10.5 K/uL   RBC 3.20 (L) 4.22 - 5.81 MIL/uL   Hemoglobin 8.4 (L) 13.0 - 17.0 g/dL   HCT 27.4 (L) 39.0 - 52.0 %   MCV 85.6 78.0 - 100.0 fL   MCH 26.3 26.0 - 34.0 pg   MCHC 30.7 30.0 - 36.0 g/dL   RDW 16.5 (H) 11.5 - 15.5 %   Platelets 331 150 - 400 K/uL  Urinalysis, Routine w reflex microscopic (not at [facility])  Result Value Ref Range   Color, Urine AMBER (A) YELLOW   APPearance CLEAR CLEAR   Specific Gravity, Urine 1.025 1.005 - 1.030   pH 5.0 5.0 - 8.0   Glucose,  UA NEGATIVE NEGATIVE mg/dL   Hgb urine dipstick NEGATIVE NEGATIVE   Bilirubin Urine SMALL (A) NEGATIVE   Ketones, ur NEGATIVE NEGATIVE mg/dL   Protein, ur >300 (A) NEGATIVE mg/dL   Urobilinogen, UA 1.0 0.0 - 1.0 mg/dL   Nitrite NEGATIVE NEGATIVE   Leukocytes, UA NEGATIVE NEGATIVE  Urine rapid drug screen (hosp performed)  Result Value Ref Range   Opiates NONE DETECTED NONE DETECTED   Cocaine POSITIVE (A) NONE DETECTED   Benzodiazepines NONE DETECTED NONE DETECTED   Amphetamines NONE DETECTED NONE DETECTED   Tetrahydrocannabinol NONE DETECTED NONE DETECTED   Barbiturates NONE DETECTED NONE DETECTED  Urine microscopic-add on  Result Value Ref Range   Squamous Epithelial / LPF RARE RARE   WBC, UA 3-6 <3 WBC/hpf   RBC / HPF 0-2 <3 RBC/hpf   Bacteria, UA RARE RARE   Casts HYALINE CASTS (A) NEGATIVE  I-stat troponin, ED  Result Value Ref Range   Troponin i, poc 0.07 0.00 - 0.08 ng/mL   Comment 3          I-stat troponin, ED  Result Value Ref Range   Troponin i, poc 0.05 0.00 - 0.08 ng/mL   Comment 3           Dg Chest 2 View  11/08/2014   CLINICAL DATA:  Chest pain and shortness of breath today. History of CHF, hypertension, coronary artery disease.  EXAM: CHEST  2 VIEW  COMPARISON:   Chest radiograph October 29, 2014  FINDINGS: Cardiac silhouette is moderate to severely enlarged, stable. Mild pulmonary vascular congestion is similar to prior examination without pleural effusion or focal consolidation. No pneumothorax. Status post median sternotomy for CABG with fractured most caudal wire. Osteopenia. Soft tissue planes are nonsuspicious.  IMPRESSION: Stable cardiomegaly and pulmonary vascular congestion.   Electronically Signed   By: Elon Alas M.D.   On: 11/08/2014 01:12   Dg Chest 2 View  10/29/2014   CLINICAL DATA:  One day history of left-sided chest pain  EXAM: CHEST  2 VIEW  COMPARISON:  October 27, 2014  FINDINGS: There is cardiomegaly with slight pulmonary venous hypertension. There is slight interstitial edema in the mid lower lung zones bilaterally. No airspace consolidation. No adenopathy. Patient is status post coronary artery bypass grafting.  IMPRESSION: Cardiomegaly with slight edema. Evidence of a degree of congestive heart failure. No airspace consolidation. Very little change from 2 days prior.   Electronically Signed   By: Lowella Grip III M.D.   On: 10/29/2014 02:01   Dg Chest 2 View  10/15/2014   CLINICAL DATA:  Chest pain for 2 days.  EXAM: CHEST  2 VIEW  COMPARISON:  Chest x-rays dated 10/05/2014 and 08/10/2014. Comparison is also made to a CT angiogram of the neck and chest dated 10/06/2014.  FINDINGS: Cardiomegaly is unchanged. Again noted is central pulmonary vascular congestion and mild bilateral interstitial prominence most suggestive of interstitial edema. No confluent airspace opacity to suggest a developing pneumonia. No pleural effusion seen. No pneumothorax. Median sternotomy wires appear intact and stable in position. No acute osseous abnormality identified.  IMPRESSION: Cardiomegaly with central pulmonary vascular congestion and mild bilateral interstitial edema suggesting mild volume overload/congestive heart failure. This may be a chronic  volume overload/congestive heart failure as the appearance is stable compared to multiple prior exams.   Electronically Signed   By: Franki Cabot M.D.   On: 10/15/2014 15:21   Ct Angio Chest Aorta W/cm &/or Wo/cm  10/15/2014   CLINICAL  DATA:  Chest pain.  Shortness of breath.  Low hemoglobin.  EXAM: CT ANGIOGRAPHY CHEST, ABDOMEN AND PELVIS  TECHNIQUE: Multidetector CT imaging through the chest, abdomen and pelvis was performed using the standard protocol during bolus administration of intravenous contrast. Multiplanar reconstructed images and MIPs were obtained and reviewed to evaluate the vascular anatomy.  CONTRAST:  16mL OMNIPAQUE IOHEXOL 350 MG/ML SOLN  COMPARISON:  None.  Chest x-ray dated 10/15/2014 and CT angiogram of the chest abdomen pelvis dated 10/06/2014  FINDINGS: CTA CHEST FINDINGS  There are no pulmonary emboli and there is no aortic dissection. There are scattered calcifications in the thoracic aorta. Extensive coronary artery calcification. Prior CABG. Cardiomegaly, unchanged since the prior exam. The patient has chronic interstitial lung disease with slight interstitial edema at the lung bases, improved since the prior CT scan. Tiny left pleural effusion.  Review of the MIP images confirms the above findings.  CTA ABDOMEN AND PELVIS FINDINGS  Extensive atherosclerosis of the arteries of the abdomen and pelvis. Severe bilateral proximal common iliac artery stenoses. No dissection or aneurysm. Single widely patent renal arteries bilaterally.  Liver, biliary tree, spleen, pancreas, adrenal glands and kidneys are normal except for an exophytic 12 mm hyperdense cyst on the lateral aspect of the mid left kidney, unchanged in size in densities since 03/08/2013.  The bowel is normal. Bladder is normal. No acute osseous abnormalities.  Review of the MIP images confirms the above findings.  IMPRESSION: 1. No acute abnormalities of the chest or abdomen. Specifically, no evidence of aortic dissection or  pulmonary emboli. 2. Improved slight interstitial pulmonary edema at the lung bases superimposed on chronic interstitial lung disease. 3. Extensive aortic atherosclerosis with severe stenoses of the common iliac arteries bilaterally.   Electronically Signed   By: Lorriane Shire M.D.   On: 10/15/2014 17:29   Dg Hip Unilat With Pelvis 2-3 Views Left  10/16/2014   CLINICAL DATA:  Left hip pain.  EXAM: DG HIP (WITH OR WITHOUT PELVIS) 2-3V LEFT  COMPARISON:  None.  FINDINGS: Mild osteoarthritis of both hips seen in the frontal projection. Proliferative changes are seen adjacent to the greater trochanter. No evidence of acute fracture or dislocation. No bony lesions or destruction identified. Vascular calcifications are seen in the iliac and femoral arteries. The bony pelvis is intact and has a normal appearance. Visible degenerative disc disease in the lower lumbar spine. Soft tissues are unremarkable.  IMPRESSION: No fracture or dislocation.  Mild osteoarthritis of the left hip.   Electronically Signed   By: Aletta Edouard M.D.   On: 10/16/2014 16:31   Ct Cta Abd/pel W/cm &/or W/o Cm  10/15/2014   CLINICAL DATA:  Chest pain.  Shortness of breath.  Low hemoglobin.  EXAM: CT ANGIOGRAPHY CHEST, ABDOMEN AND PELVIS  TECHNIQUE: Multidetector CT imaging through the chest, abdomen and pelvis was performed using the standard protocol during bolus administration of intravenous contrast. Multiplanar reconstructed images and MIPs were obtained and reviewed to evaluate the vascular anatomy.  CONTRAST:  136mL OMNIPAQUE IOHEXOL 350 MG/ML SOLN  COMPARISON:  None.  Chest x-ray dated 10/15/2014 and CT angiogram of the chest abdomen pelvis dated 10/06/2014  FINDINGS: CTA CHEST FINDINGS  There are no pulmonary emboli and there is no aortic dissection. There are scattered calcifications in the thoracic aorta. Extensive coronary artery calcification. Prior CABG. Cardiomegaly, unchanged since the prior exam. The patient has chronic  interstitial lung disease with slight interstitial edema at the lung bases, improved since the prior CT  scan. Tiny left pleural effusion.  Review of the MIP images confirms the above findings.  CTA ABDOMEN AND PELVIS FINDINGS  Extensive atherosclerosis of the arteries of the abdomen and pelvis. Severe bilateral proximal common iliac artery stenoses. No dissection or aneurysm. Single widely patent renal arteries bilaterally.  Liver, biliary tree, spleen, pancreas, adrenal glands and kidneys are normal except for an exophytic 12 mm hyperdense cyst on the lateral aspect of the mid left kidney, unchanged in size in densities since 03/08/2013.  The bowel is normal. Bladder is normal. No acute osseous abnormalities.  Review of the MIP images confirms the above findings.  IMPRESSION: 1. No acute abnormalities of the chest or abdomen. Specifically, no evidence of aortic dissection or pulmonary emboli. 2. Improved slight interstitial pulmonary edema at the lung bases superimposed on chronic interstitial lung disease. 3. Extensive aortic atherosclerosis with severe stenoses of the common iliac arteries bilaterally.   Electronically Signed   By: Lorriane Shire M.D.   On: 10/15/2014 17:29    Suspect chest pain is cocaine induced   No more cocaine.  Rules out for MI in the ED with unchaged EKG and 2 negative troponins,  Sleeping the entire time.    I personally performed the services described in this documentation, which was scribed in my presence. The recorded information has been reviewed and is accurate.     Veatrice Kells, MD 11/08/14 [phone]

## 2014-11-08 NOTE — ED Provider Notes (Signed)
CSN: [id number]     Arrival date & time 11/08/14  2120 History   First MD Initiated Contact with Patient 11/08/14 2125     Chief Complaint  Patient presents with  . Chest Pain  . Leg Pain     (Consider location/radiation/quality/duration/timing/severity/associated sxs/prior Treatment) HPI Comments: The patient is a 69 year old man who has a history of coronary disease status post CABG in 2009. He also has a history of mixed systolic and diastolic heart failure. He had an echocardiogram in March that showed an ejection fraction of 45% with grade 2 diastolic dysfunction. He also had a stress test, Myoview in September 2015 which was negative for ischemia. The patient was recently admitted to the hospital for chest pain, he was found to have a troponin that was borderline elevated, it did not go up or go down but remained flat. 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 He has been evaluated by vascular surgery and has had a peripheral vascular arterial study showing this, it was recommended that conservative management was the best option. He reports ongoing chronic pain in the left lower extremity which has not changed, is not new, is not worsened. He also complains of chest pain which she is states is poorly described but has been persistent for the last several days and for which she was evaluated within the last 20 hours with negative troponin 2. The patient was found to be positive for cocaine, he states that his last used was Tuesday. He states that he has associated shortness of breath but not at this time.  Patient is a 69 y.o. male presenting with chest pain and leg pain. The history is provided by the patient and medical records.  Chest Pain Leg Pain   Past Medical History  Diagnosis Date  . Iron deficiency anemia   . Chronic combined systolic and diastolic CHF, NYHA class 2     a. 11/2013 Echo: EF  40-45%, basl-mid inflat AK, Gr 2 DD.  [name] Hyperlipidemia LDL goal <70   . Ischemic cardiomyopathy     a. 11/2013 Echo: EF 40-45%, basl-mid inflat AK, Gr 2 DD, mild AI, mod dil LA/RA, mod reduced RV fxn, PASP 91mmHg.  . Moderate to severe pulmonary hypertension 03/2010    a. PA peak pressure of 76 mmHg (per 2D Echo 03/2010)  . Polysubstance abuse     a. cocaine, THC  . AV malformation of gastrointestinal tract     a. w/ h/o GIB.  [name] Family history of early CAD   . Peripheral vascular disease   . Seizure disorder   . Hypertension   . CAD (coronary artery disease)     a. s/p 3-vessel CABG (12/2007) // 100% RCA stenosis with collaterals from left system. Severe bifurcation lesions of proxima CXA and OM. Moderate LAD disease - followed by Dr. Wyline Copas in [facility];  b. 11/2013 Myoview: Large fixed inf defect w/o ischemia, EF 35%.  . Chronic kidney disease (CKD), stage III (moderate)     a. BL SCr 1.5-1.6  . Renal artery stenosis   . Cancer     "I have cancer; right now it's not known what kind" (09/17/2014)  . Myocardial infarction 08/2007; 12/2007  . Anginal pain   . COPD (chronic obstructive pulmonary disease)   . Pneumonia     "I've had pneumonia once" (09/17/2014)  . Obstructive sleep apnea     "never told me  to wear mask" (09/17/2014)  . History of blood transfusion "I've had quite a few"    "they don't know where it's coming from" (09/17/2014)  . History of hiatal hernia   . Seizures     "I've had a few; don't know what kind they call it" (09/17/2014)  . DDD (degenerative disc disease), lumbar   . Arthritis     "ankles; left hip; wrists" (09/17/2014)  . Chronic lower back pain   . Depression   . Bipolar disorder   . Schizophrenia   . BPH (benign prostatic hyperplasia)    Past Surgical History  Procedure Laterality Date  . Apc  03/2010    To treat small bowel AVMs  . Esophagogastroduodenoscopy N/A 08/14/2012    Procedure: ESOPHAGOGASTRODUODENOSCOPY (EGD);  Surgeon: [name], MD;   Location: [facility] ENDOSCOPY;  Service: Endoscopy;  Laterality: N/A;  . Hot hemostasis N/A 08/14/2012    Procedure: HOT HEMOSTASIS (ARGON PLASMA COAGULATION/BICAP);  Surgeon: [name], MD;  Location: [facility] ENDOSCOPY;  Service: Endoscopy;  Laterality: N/A;  . Esophagogastroduodenoscopy (egd) with propofol N/A 08/04/2013    Procedure: ESOPHAGOGASTRODUODENOSCOPY (EGD) WITH PROPOFOL;  Surgeon: [name], MD;  Location: [facility] ENDOSCOPY;  Service: Endoscopy;  Laterality: N/A;  . Esophagogastroduodenoscopy (egd) with propofol N/A 06/08/2014    Procedure: ESOPHAGOGASTRODUODENOSCOPY (EGD) WITH PROPOFOL;  Surgeon: Milus Banister, MD;  Location: [place];  Service: Endoscopy;  Laterality: N/A;  . Peripheral vascular catheterization N/A 09/07/2014    Procedure: Abdominal Aortogram;  Surgeon: Angelia Mould, MD;  Location: [place] [facility];  Service: Cardiovascular;  Laterality: N/A;  . Coronary artery bypass graft  12/2007    "CABG X3"  . Coronary angioplasty with stent placement  08/2007  . Enteroscopy N/A 10/16/2014    Procedure: ENTEROSCOPY;  Surgeon: [name], MD;  Location: [facility] ENDOSCOPY;  Service: Endoscopy;  Laterality: N/A;   Family History  Problem Relation Age of Onset  . Heart disease Mother     unknown type  . Heart disease Father 85    died of MI at 42yo  . Heart disease Paternal Grandfather 25    died of MI  . Heart disease    . Heart disease Brother 7   Social History  Substance Use Topics  . Smoking status: Former Smoker -- 0.25 packs/day for 10 years    Types: Cigarettes  . Smokeless tobacco: Never Used     Comment: "quit smoking cigarettes in 2009; smoked off and on since I was 13"  . Alcohol Use: No     Comment: 09/17/2014 "maybe 4 beers/month, 12 oz cans"    Review of Systems  Cardiovascular: Positive for chest pain.  All other systems reviewed and are negative.     Allergies  Motrin and Tylenol  Home Medications   Prior to Admission medications    Medication Sig Start Date End Date Taking? Authorizing Provider  albuterol (PROVENTIL HFA;VENTOLIN HFA) 108 (90 BASE) MCG/ACT inhaler Inhale 2 puffs into the lungs every 6 (six) hours as needed for wheezing or shortness of breath. 04/30/14   Thurnell Lose, MD  amLODipine (NORVASC) 10 MG tablet Take 1 tablet (10 mg total) by mouth daily. 09/18/14   Shanker Kristeen Mans, MD  aspirin EC 81 MG tablet Take 1 tablet (81 mg total) by mouth daily. RESUME AFTER 1 WEEK. 10/18/14   Bonnielee Haff, MD  atorvastatin (LIPITOR) 80 MG tablet Take 1 tablet (80 mg total) by mouth daily at 6 PM. 10/07/14  Charlynne Cousins, MD  budesonide-formoterol [facility]) 80-4.5 MCG/ACT inhaler Inhale 2 puffs into the lungs 2 (two) times daily. 09/08/14   Ripudeep Krystal Eaton, MD  citalopram (CELEXA) 20 MG tablet Take 20 mg by mouth daily.    Historical Provider, MD  diclofenac sodium (VOLTAREN) 1 % GEL Apply 4 g topically 4 (four) times daily. 09/22/14   April Palumbo, MD  divalproex (DEPAKOTE ER) 500 MG 24 hr tablet Take 500 mg by mouth 2 (two) times daily. 05/29/14   Historical Provider, MD  ferrous sulfate 325 (65 FE) MG tablet Take 325 mg by mouth 2 (two) times daily with a meal.     Historical Provider, MD  hydrALAZINE (APRESOLINE) 100 MG tablet Take 100 mg by mouth 3 (three) times daily. 05/29/14   Historical Provider, MD  HYDROcodone-acetaminophen (NORCO/VICODIN) 5-325 MG per tablet Take 1-2 tablets by mouth every 6 (six) hours as needed for moderate pain. 10/18/14   Bonnielee Haff, MD  isosorbide mononitrate (IMDUR) 30 MG 24 hr tablet Take 2 tablets (60 mg total) by mouth daily. 07/26/14   Modena Jansky, MD  metolazone (ZAROXOLYN) 5 MG tablet Take 1 tablet (5 mg total) by mouth as needed (for weight gain of 2 lbs in 2-3 days). 10/18/14   Bonnielee Haff, MD  nitroGLYCERIN (NITROSTAT) 0.4 MG SL tablet Place 1 tablet (0.4 mg total) under the tongue every 5 (five) minutes as needed for chest pain (upto max 3 doses at one time.). 07/26/14    Modena Jansky, MD  pantoprazole (PROTONIX) 40 MG tablet Take 40 mg by mouth daily.     Historical Provider, MD  phenytoin (DILANTIN) 200 MG ER capsule Take 1 capsule (200 mg total) by mouth 2 (two) times daily. 10/31/14   Janece Canterbury, MD  potassium chloride SA (KLOR-CON M20) 20 MEQ tablet Take 20 mEq by mouth daily. 01/17/10   Historical Provider, MD  torsemide (DEMADEX) 20 MG tablet Take 2 tablets (40 mg total) by mouth daily. 10/18/14   Bonnielee Haff, MD   BP 167/100 mmHg  Pulse 82  Temp(Src) 97.7 F (36.5 C) (Oral)  Resp 18  SpO2 99% Physical Exam  Constitutional: He appears well-developed and well-nourished. No distress.  HENT:  Head: Normocephalic and atraumatic.  Mouth/Throat: Oropharynx is clear and moist. No oropharyngeal exudate.  Eyes: Conjunctivae and EOM are normal. Pupils are equal, round, and reactive to light. Right eye exhibits no discharge. Left eye exhibits no discharge. No scleral icterus.  Neck: Normal range of motion. Neck supple. No JVD present. No thyromegaly present.  Cardiovascular: Normal rate, regular rhythm, normal heart sounds and intact distal pulses.  Exam reveals no gallop and no friction rub.   No murmur heard. Pulmonary/Chest: Effort normal and breath sounds normal. No respiratory distress. He has no wheezes. He has no rales.  Abdominal: Soft. Bowel sounds are normal. He exhibits no distension and no mass. There is no tenderness.  Musculoskeletal: Normal range of motion. He exhibits no edema or tenderness.  Warm leg on the left, no palpable pulses but normal capillary refill.  Lymphadenopathy:    He has no cervical adenopathy.  Neurological: He is alert. Coordination normal.  Skin: Skin is warm and dry. No rash noted. No erythema.  Psychiatric: He has a normal mood and affect. His behavior is normal.  Nursing note and vitals reviewed.   ED Course  Procedures (including critical care time) Labs Review Labs Reviewed  I-STAT CHEM 8, ED -  Abnormal; Notable for the following:  BUN 36 (*)    Creatinine, Ser 1.50 (*)    Hemoglobin 9.9 (*)    HCT 29.0 (*)    All other components within normal limits  I-STAT TROPOININ, ED    Imaging Review Dg Chest 2 View  11/08/2014   CLINICAL DATA:  Chest pain and shortness of breath today. History of CHF, hypertension, coronary artery disease.  EXAM: CHEST  2 VIEW  COMPARISON:  Chest radiograph October 29, 2014  FINDINGS: Cardiac silhouette is moderate to severely enlarged, stable. Mild pulmonary vascular congestion is similar to prior examination without pleural effusion or focal consolidation. No pneumothorax. Status post median sternotomy for CABG with fractured most caudal wire. Osteopenia. Soft tissue planes are nonsuspicious.  IMPRESSION: Stable cardiomegaly and pulmonary vascular congestion.   Electronically Signed   By: Elon Alas M.D.   On: 11/08/2014 01:12   I have personally reviewed and evaluated these images and lab results as part of my medical decision-making.   EKG Interpretation   Date/Time:  Sunday November 08 2014 21:31:45 EDT Ventricular Rate:  85 PR Interval:  187 QRS Duration: 130 QT Interval:  412 QTC Calculation: 490 R Axis:   76 Text Interpretation:  Sinus rhythm Nonspecific intraventricular conduction  delay Inferoposterior infarct, recent Lateral leads are also involved  Artifact in lead(s) I III aVR aVL aVF since last tracing no significant  change Confirmed by Yoshua Geisinger  MD, Zabdiel Dripps ([id number]) on 11/08/2014 9:45:40 PM      MDM   Final diagnoses:  Chronic chest pain  Chronic leg pain, left    Normal heart and lung sounds, no murmur, no rales, speaks in full sentences, no distress, vitals are unremarkable. EKG evaluated, unchanged from prior EKGs. Evaluate with troponin and chest x-ray, the patient was informed he would get either Tylenol or ibuprofen for pain if he wanted it. He is unsatisfied with that answer. His left lower extremity is chronically in  pain from chronic claudication in peripheral vascular disease. This is not a new problem, he will not receive opiate medications for this.   labs totally unremarkable, the patient appears totally stable for discharge. He was cautioned against ongoing cocaine use. He claims to have both allergies to Tylenol and Motrin, at this point he will not receive any other pain medications.    Noemi Chapel, MD 11/08/14 [phone]

## 2014-11-08 NOTE — ED Notes (Signed)
Pt updated on delay and comfort measures given.  Pt informed MD must give results.

## 2014-11-09 ENCOUNTER — Emergency Department (HOSPITAL_COMMUNITY): Payer: Medicare Other

## 2014-11-09 ENCOUNTER — Encounter (HOSPITAL_COMMUNITY): Payer: Self-pay | Admitting: *Deleted

## 2014-11-09 ENCOUNTER — Emergency Department (HOSPITAL_COMMUNITY)
Admission: EM | Admit: 2014-11-09 | Discharge: 2014-11-09 | Disposition: A | Discharge status: Home / Self Care | Payer: Medicare Other | Attending: Emergency Medicine | Admitting: Emergency Medicine

## 2014-11-09 DIAGNOSIS — M79605 Pain in left leg: Secondary | ICD-10-CM | POA: Diagnosis not present

## 2014-11-09 DIAGNOSIS — Z8701 Personal history of pneumonia (recurrent): Secondary | ICD-10-CM | POA: Insufficient documentation

## 2014-11-09 DIAGNOSIS — Z951 Presence of aortocoronary bypass graft: Secondary | ICD-10-CM | POA: Diagnosis not present

## 2014-11-09 DIAGNOSIS — Z7982 Long term (current) use of aspirin: Secondary | ICD-10-CM | POA: Insufficient documentation

## 2014-11-09 DIAGNOSIS — M19071 Primary osteoarthritis, right ankle and foot: Secondary | ICD-10-CM | POA: Diagnosis not present

## 2014-11-09 DIAGNOSIS — M19031 Primary osteoarthritis, right wrist: Secondary | ICD-10-CM | POA: Insufficient documentation

## 2014-11-09 DIAGNOSIS — M19032 Primary osteoarthritis, left wrist: Secondary | ICD-10-CM | POA: Diagnosis not present

## 2014-11-09 DIAGNOSIS — M169 Osteoarthritis of hip, unspecified: Secondary | ICD-10-CM | POA: Diagnosis not present

## 2014-11-09 DIAGNOSIS — D509 Iron deficiency anemia, unspecified: Secondary | ICD-10-CM | POA: Diagnosis not present

## 2014-11-09 DIAGNOSIS — Z87891 Personal history of nicotine dependence: Secondary | ICD-10-CM | POA: Insufficient documentation

## 2014-11-09 DIAGNOSIS — G8929 Other chronic pain: Secondary | ICD-10-CM

## 2014-11-09 DIAGNOSIS — J441 Chronic obstructive pulmonary disease with (acute) exacerbation: Secondary | ICD-10-CM | POA: Diagnosis not present

## 2014-11-09 DIAGNOSIS — Z8589 Personal history of malignant neoplasm of other organs and systems: Secondary | ICD-10-CM | POA: Insufficient documentation

## 2014-11-09 DIAGNOSIS — I25119 Atherosclerotic heart disease of native coronary artery with unspecified angina pectoris: Secondary | ICD-10-CM | POA: Diagnosis not present

## 2014-11-09 DIAGNOSIS — N183 Chronic kidney disease, stage 3 (moderate): Secondary | ICD-10-CM | POA: Insufficient documentation

## 2014-11-09 DIAGNOSIS — I129 Hypertensive chronic kidney disease with stage 1 through stage 4 chronic kidney disease, or unspecified chronic kidney disease: Secondary | ICD-10-CM | POA: Insufficient documentation

## 2014-11-09 DIAGNOSIS — Z9889 Other specified postprocedural states: Secondary | ICD-10-CM | POA: Diagnosis not present

## 2014-11-09 DIAGNOSIS — M19072 Primary osteoarthritis, left ankle and foot: Secondary | ICD-10-CM | POA: Diagnosis not present

## 2014-11-09 DIAGNOSIS — F319 Bipolar disorder, unspecified: Secondary | ICD-10-CM | POA: Diagnosis not present

## 2014-11-09 DIAGNOSIS — Z9861 Coronary angioplasty status: Secondary | ICD-10-CM | POA: Diagnosis not present

## 2014-11-09 DIAGNOSIS — I252 Old myocardial infarction: Secondary | ICD-10-CM | POA: Insufficient documentation

## 2014-11-09 DIAGNOSIS — Z79899 Other long term (current) drug therapy: Secondary | ICD-10-CM | POA: Insufficient documentation

## 2014-11-09 DIAGNOSIS — Z7951 Long term (current) use of inhaled steroids: Secondary | ICD-10-CM | POA: Insufficient documentation

## 2014-11-09 DIAGNOSIS — Z87438 Personal history of other diseases of male genital organs: Secondary | ICD-10-CM | POA: Insufficient documentation

## 2014-11-09 DIAGNOSIS — R079 Chest pain, unspecified: Secondary | ICD-10-CM | POA: Diagnosis present

## 2014-11-09 DIAGNOSIS — G40909 Epilepsy, unspecified, not intractable, without status epilepticus: Secondary | ICD-10-CM | POA: Insufficient documentation

## 2014-11-09 DIAGNOSIS — I5042 Chronic combined systolic (congestive) and diastolic (congestive) heart failure: Secondary | ICD-10-CM | POA: Insufficient documentation

## 2014-11-09 DIAGNOSIS — Z791 Long term (current) use of non-steroidal anti-inflammatories (NSAID): Secondary | ICD-10-CM | POA: Diagnosis not present

## 2014-11-09 DIAGNOSIS — E785 Hyperlipidemia, unspecified: Secondary | ICD-10-CM | POA: Diagnosis not present

## 2014-11-09 LAB — CBC WITH DIFFERENTIAL/PLATELET
Basophils Absolute: 0.1 10*3/uL (ref 0.0–0.1)
Basophils Relative: 1 % (ref 0–1)
Eosinophils Absolute: 0.1 10*3/uL (ref 0.0–0.7)
Eosinophils Relative: 1 % (ref 0–5)
HEMATOCRIT: 29.4 % — AB (ref 39.0–52.0)
HEMOGLOBIN: 9.1 g/dL — AB (ref 13.0–17.0)
LYMPHS ABS: 0.6 10*3/uL — AB (ref 0.7–4.0)
Lymphocytes Relative: 12 % (ref 12–46)
MCH: 27.1 pg (ref 26.0–34.0)
MCHC: 31 g/dL (ref 30.0–36.0)
MCV: 87.5 fL (ref 78.0–100.0)
MONO ABS: 0.6 10*3/uL (ref 0.1–1.0)
MONOS PCT: 11 % (ref 3–12)
NEUTROS ABS: 3.7 10*3/uL (ref 1.7–7.7)
NEUTROS PCT: 75 % (ref 43–77)
Platelets: 327 10*3/uL (ref 150–400)
RBC: 3.36 MIL/uL — ABNORMAL LOW (ref 4.22–5.81)
RDW: 16.6 % — AB (ref 11.5–15.5)
WBC: 4.9 10*3/uL (ref 4.0–10.5)

## 2014-11-09 LAB — BASIC METABOLIC PANEL
Anion gap: 10 (ref 5–15)
BUN: 36 mg/dL — AB (ref 6–20)
CHLORIDE: 109 mmol/L (ref 101–111)
CO2: 19 mmol/L — AB (ref 22–32)
CREATININE: 1.49 mg/dL — AB (ref 0.61–1.24)
Calcium: 8.9 mg/dL (ref 8.9–10.3)
GFR calc Af Amer: 53 mL/min — ABNORMAL LOW (ref >60)
GFR calc non Af Amer: 46 mL/min — ABNORMAL LOW (ref >60)
GLUCOSE: 100 mg/dL — AB (ref 65–99)
Potassium: 4.2 mmol/L (ref 3.5–5.1)
Sodium: 138 mmol/L (ref 135–145)

## 2014-11-09 LAB — I-STAT TROPONIN, ED: Troponin i, poc: 0.06 ng/mL (ref 0.00–0.08)

## 2014-11-09 MED ORDER — ACETAMINOPHEN 325 MG PO TABS
650.0000 mg | ORAL_TABLET | Freq: Once | ORAL | Status: AC
  Administered 2014-11-09: 650 mg via ORAL
  Filled 2014-11-09: qty 2

## 2014-11-09 NOTE — ED Notes (Signed)
Patient reports that he needs to speak with social work because he has "no way of getting home." pt reports he has no family or friends that can give him a ride.

## 2014-11-09 NOTE — ED Notes (Signed)
Patient transported to X-ray 

## 2014-11-09 NOTE — Discharge Instructions (Signed)
Your testing with her heart today appears stable.  Continue to avoid Cocaine, and take your regular medications.  Your blood count is stable at 9.  Your weight is appropriate. You do not appear to be retaining fluid.  Tylenol as needed for pain.

## 2014-11-09 NOTE — ED Notes (Signed)
Entered room to discharge patient: Patient dissatisfied with discharge. Requested taxi voucher to transfer him home. Advised patient that due to his home being in [facility] we may not be able to offer a taxi voucher. Spoke with Charge RN Richarda Blade. about transport for patient. Patient lives 16 miles from this location. Patient informed that due to distance we cannot offer patient a taxi voucher at this time. Offered to call patient's daughter who is listed in emergency contacts, but patient declined. Offered bus pass to patient, but he declined. States that he plans to speak with the president of the hospital in the AM. Patient will wait in waiting area until AM.

## 2014-11-09 NOTE — ED Provider Notes (Signed)
CSN: [id number]     Arrival date & time 11/09/14  0449 History   First MD Initiated Contact with Patient 11/09/14 0645     Chief Complaint  Patient presents with  . Chest Pain      HPI  Patient presents for evaluation of the chief complaint of left leg pain. States his left leg hurts from his heel to his left hip. States it has for months, and it " won't stop".  Also states he has pain in his left chest. States he has pain almost every day. Denies any feels more short of breath. States he was seen and evaluated last night and" over everything was "okay".   He perseverates about the fact that he was not given a taxi voucher last night. He states he "had to stay out front all night".  Which is 164. On his most recent admission for congestive heart failure he was 184. He denies edema. He denies PND. He states he is compliant with medications. He states he is "trying to stop" using cocaine. His last use was on Tuesday, 6 days ago.  Past Medical History  Diagnosis Date  . Iron deficiency anemia   . Chronic combined systolic and diastolic CHF, NYHA class 2     a. 11/2013 Echo: EF 40-45%, basl-mid inflat AK, Gr 2 DD.  [name] Hyperlipidemia LDL goal <70   . Ischemic cardiomyopathy     a. 11/2013 Echo: EF 40-45%, basl-mid inflat AK, Gr 2 DD, mild AI, mod dil LA/RA, mod reduced RV fxn, PASP 50mmHg.  . Moderate to severe pulmonary hypertension 03/2010    a. PA peak pressure of 76 mmHg (per 2D Echo 03/2010)  . Polysubstance abuse     a. cocaine, THC  . AV malformation of gastrointestinal tract     a. w/ h/o GIB.  [name] Family history of early CAD   . Peripheral vascular disease   . Seizure disorder   . Hypertension   . CAD (coronary artery disease)     a. s/p 3-vessel CABG (12/2007) // 100% RCA stenosis with collaterals from left system. Severe bifurcation lesions of proxima CXA and OM. Moderate LAD disease - followed by Dr. Wyline Copas in [facility];  b. 11/2013 Myoview: Large fixed inf defect w/o ischemia,  EF 35%.  . Chronic kidney disease (CKD), stage III (moderate)     a. BL SCr 1.5-1.6  . Renal artery stenosis   . Cancer     "I have cancer; right now it's not known what kind" (09/17/2014)  . Myocardial infarction 08/2007; 12/2007  . Anginal pain   . COPD (chronic obstructive pulmonary disease)   . Pneumonia     "I've had pneumonia once" (09/17/2014)  . Obstructive sleep apnea     "never told me to wear mask" (09/17/2014)  . History of blood transfusion "I've had quite a few"    "they don't know where it's coming from" (09/17/2014)  . History of hiatal hernia   . Seizures     "I've had a few; don't know what kind they call it" (09/17/2014)  . DDD (degenerative disc disease), lumbar   . Arthritis     "ankles; left hip; wrists" (09/17/2014)  . Chronic lower back pain   . Depression   . Bipolar disorder   . Schizophrenia   . BPH (benign prostatic hyperplasia)    Past Surgical History  Procedure Laterality Date  . Apc  03/2010    To treat small bowel AVMs  .  Esophagogastroduodenoscopy N/A 08/14/2012    Procedure: ESOPHAGOGASTRODUODENOSCOPY (EGD);  Surgeon: [name], MD;  Location: [facility] ENDOSCOPY;  Service: Endoscopy;  Laterality: N/A;  . Hot hemostasis N/A 08/14/2012    Procedure: HOT HEMOSTASIS (ARGON PLASMA COAGULATION/BICAP);  Surgeon: [name], MD;  Location: [facility] ENDOSCOPY;  Service: Endoscopy;  Laterality: N/A;  . Esophagogastroduodenoscopy (egd) with propofol N/A 08/04/2013    Procedure: ESOPHAGOGASTRODUODENOSCOPY (EGD) WITH PROPOFOL;  Surgeon: [name], MD;  Location: [facility] ENDOSCOPY;  Service: Endoscopy;  Laterality: N/A;  . Esophagogastroduodenoscopy (egd) with propofol N/A 06/08/2014    Procedure: ESOPHAGOGASTRODUODENOSCOPY (EGD) WITH PROPOFOL;  Surgeon: Milus Banister, MD;  Location: [place];  Service: Endoscopy;  Laterality: N/A;  . Peripheral vascular catheterization N/A 09/07/2014    Procedure: Abdominal Aortogram;  Surgeon: Angelia Mould, MD;  Location: [place] CV LAB;  Service: Cardiovascular;  Laterality: N/A;  . Coronary artery bypass graft  12/2007    "CABG X3"  . Coronary angioplasty with stent placement  08/2007  . Enteroscopy N/A 10/16/2014    Procedure: ENTEROSCOPY;  Surgeon: [name], MD;  Location: [facility] ENDOSCOPY;  Service: Endoscopy;  Laterality: N/A;   Family History  Problem Relation Age of Onset  . Heart disease Mother     unknown type  . Heart disease Father 24    died of MI at 18yo  . Heart disease Paternal Grandfather 54    died of MI  . Heart disease    . Heart disease Brother 24   Social History  Substance Use Topics  . Smoking status: Former Smoker -- 0.25 packs/day for 10 years    Types: Cigarettes  . Smokeless tobacco: Never Used     Comment: "quit smoking cigarettes in 2009; smoked off and on since I was 13"  . Alcohol Use: No     Comment: 09/17/2014 "maybe 4 beers/month, 12 oz cans"    Review of Systems  Constitutional: Negative for fever, chills, diaphoresis, appetite change and fatigue.  HENT: Negative for mouth sores, sore throat and trouble swallowing.   Eyes: Negative for visual disturbance.  Respiratory: Positive for shortness of breath. Negative for cough, chest tightness and wheezing.   Cardiovascular: Negative for chest pain.  Gastrointestinal: Negative for nausea, vomiting, abdominal pain, diarrhea and abdominal distention.  Endocrine: Negative for polydipsia, polyphagia and polyuria.  Genitourinary: Negative for dysuria, frequency and hematuria.  Musculoskeletal: Negative for gait problem.       Left leg pain  Skin: Negative for color change, pallor and rash.  Neurological: Negative for dizziness, syncope, light-headedness and headaches.  Hematological: Does not bruise/bleed easily.  Psychiatric/Behavioral: Negative for behavioral problems and confusion.      Allergies  Motrin and Tylenol  Home Medications   Prior to Admission medications   Medication Sig Start Date End Date  Taking? Authorizing Provider  albuterol (PROVENTIL HFA;VENTOLIN HFA) 108 (90 BASE) MCG/ACT inhaler Inhale 2 puffs into the lungs every 6 (six) hours as needed for wheezing or shortness of breath. 04/30/14   Thurnell Lose, MD  amLODipine (NORVASC) 10 MG tablet Take 1 tablet (10 mg total) by mouth daily. 09/18/14   Shanker Kristeen Mans, MD  aspirin EC 81 MG tablet Take 1 tablet (81 mg total) by mouth daily. RESUME AFTER 1 WEEK. 10/18/14   Bonnielee Haff, MD  atorvastatin (LIPITOR) 80 MG tablet Take 1 tablet (80 mg total) by mouth daily at 6 PM. 10/07/14   Charlynne Cousins, MD  budesonide-formoterol [facility]) 80-4.5 MCG/ACT inhaler Inhale  2 puffs into the lungs 2 (two) times daily. 09/08/14   Ripudeep Krystal Eaton, MD  citalopram (CELEXA) 20 MG tablet Take 20 mg by mouth daily.    Historical Provider, MD  diclofenac sodium (VOLTAREN) 1 % GEL Apply 4 g topically 4 (four) times daily. 09/22/14   April Palumbo, MD  divalproex (DEPAKOTE ER) 500 MG 24 hr tablet Take 500 mg by mouth 2 (two) times daily. 05/29/14   Historical Provider, MD  ferrous sulfate 325 (65 FE) MG tablet Take 325 mg by mouth 2 (two) times daily with a meal.     Historical Provider, MD  hydrALAZINE (APRESOLINE) 100 MG tablet Take 100 mg by mouth 3 (three) times daily. 05/29/14   Historical Provider, MD  HYDROcodone-acetaminophen (NORCO/VICODIN) 5-325 MG per tablet Take 1-2 tablets by mouth every 6 (six) hours as needed for moderate pain. 10/18/14   Bonnielee Haff, MD  isosorbide mononitrate (IMDUR) 30 MG 24 hr tablet Take 2 tablets (60 mg total) by mouth daily. 07/26/14   Modena Jansky, MD  metolazone (ZAROXOLYN) 5 MG tablet Take 1 tablet (5 mg total) by mouth as needed (for weight gain of 2 lbs in 2-3 days). 10/18/14   Bonnielee Haff, MD  nitroGLYCERIN (NITROSTAT) 0.4 MG SL tablet Place 1 tablet (0.4 mg total) under the tongue every 5 (five) minutes as needed for chest pain (upto max 3 doses at one time.). 07/26/14   Modena Jansky, MD  pantoprazole  (PROTONIX) 40 MG tablet Take 40 mg by mouth daily.     Historical Provider, MD  phenytoin (DILANTIN) 200 MG ER capsule Take 1 capsule (200 mg total) by mouth 2 (two) times daily. 10/31/14   Janece Canterbury, MD  potassium chloride SA (KLOR-CON M20) 20 MEQ tablet Take 20 mEq by mouth daily. 01/17/10   Historical Provider, MD  torsemide (DEMADEX) 20 MG tablet Take 2 tablets (40 mg total) by mouth daily. 10/18/14   Bonnielee Haff, MD   BP 160/96 mmHg  Pulse 91  Temp(Src) 97.9 F (36.6 C) (Oral)  Resp 18  Wt 163 lb (73.936 kg)  SpO2 100% Physical Exam  Constitutional: He is oriented to person, place, and time. He appears well-developed and well-nourished. No distress.  HENT:  Head: Normocephalic.  Eyes: Conjunctivae are normal. Pupils are equal, round, and reactive to light. No scleral icterus.  Neck: Normal range of motion. Neck supple. No thyromegaly present.  Cardiovascular: Normal rate and regular rhythm.  Exam reveals no gallop and no friction rub.   No murmur heard. Pulmonary/Chest: Effort normal and breath sounds normal. No respiratory distress. He has no wheezes. He has no rales.  Clear bilateral breath sounds.  Abdominal: Soft. Bowel sounds are normal. He exhibits no distension. There is no tenderness. There is no rebound.  Musculoskeletal: Normal range of motion.  Neurological: He is alert and oriented to person, place, and time.  Skin: Skin is warm and dry. No rash noted.  Psychiatric: He has a normal mood and affect. His behavior is normal.    ED Course  Procedures (including critical care time) Labs Review Labs Reviewed  CBC WITH DIFFERENTIAL/PLATELET - Abnormal; Notable for the following:    RBC 3.36 (*)    Hemoglobin 9.1 (*)    HCT 29.4 (*)    RDW 16.6 (*)    Lymphs Abs 0.6 (*)    All other components within normal limits  BASIC METABOLIC PANEL - Abnormal; Notable for the following:    CO2 19 (*)  Glucose, Bld 100 (*)    BUN 36 (*)    Creatinine, Ser 1.49 (*)     GFR calc non Af Amer 46 (*)    GFR calc Af Amer 53 (*)    All other components within normal limits  I-STAT TROPOININ, ED    Imaging Review Dg Chest 2 View  11/09/2014   CLINICAL DATA:  Left-sided chest pain, productive cough. History of hypertension and coronary artery disease.  EXAM: CHEST  2 VIEW  COMPARISON:  11/08/2014  FINDINGS: Moderate enlargement of the cardiac silhouette noted with central vascular congestion. Mild prominence of the interstitial markings is identified bilateral without focal consolidation. No pleural effusion. No acute osseous abnormality.  IMPRESSION: Moderate cardiomegaly with central vascular congestion and borderline interstitial edema.   Electronically Signed   By: Conchita Paris M.D.   On: 11/09/2014 07:35   Dg Chest 2 View  11/08/2014   CLINICAL DATA:  Chest pain and shortness of breath today. History of CHF, hypertension, coronary artery disease.  EXAM: CHEST  2 VIEW  COMPARISON:  Chest radiograph October 29, 2014  FINDINGS: Cardiac silhouette is moderate to severely enlarged, stable. Mild pulmonary vascular congestion is similar to prior examination without pleural effusion or focal consolidation. No pneumothorax. Status post median sternotomy for CABG with fractured most caudal wire. Osteopenia. Soft tissue planes are nonsuspicious.  IMPRESSION: Stable cardiomegaly and pulmonary vascular congestion.   Electronically Signed   By: Elon Alas M.D.   On: 11/08/2014 01:12   I have personally reviewed and evaluated these images and lab results as part of my medical decision-making.   EKG Interpretation   Date/Time:  Monday November 09 2014 04:56:46 EDT Ventricular Rate:  89 PR Interval:  174 QRS Duration: 116 QT Interval:  374 QTC Calculation: 455 R Axis:   95 Text Interpretation:  Sinus rhythm with occasional Premature ventricular  complexes Inferior-posterior infarct , age undetermined No ST elevations  Abnormal ECG Confirmed by Jeneen Rinks  MD, [place] ([id number])  on 11/09/2014 6:54:27 AM      MDM   Final diagnoses:  Chronic pain    EKG is without change. Troponin was not elevated. Chest x-ray shows vascular congestion without edema. Clinically not in acute congestive heart failure. His weight is down. His hemoglobin is stable. His symptoms seem more socially related and they do have any actual acute medical condition. Given appropriate for discharge home. Tylenol for pain.    Tanna Furry, MD 11/09/14 [phone]

## 2014-11-09 NOTE — ED Notes (Signed)
Patient states his chest is still hurting (chronic condition).  Patient has been sitting in the lobby requesting to talk with the head of the hospital to get a taxi voucher back to [employer].  Explained to him that he needs to make arrangements to get home if he calls EMS to bring him here from [facility].  AC was contacted regarding patient.  Patient laid down on the floor in the waiting room because he wants a place to lay down.  Explained that the beds are for patients and we are very busy tonight.

## 2014-11-09 NOTE — ED Notes (Signed)
Patient now states he does not want to wait for social work.

## 2014-11-10 ENCOUNTER — Emergency Department (HOSPITAL_COMMUNITY): Payer: Medicare Other

## 2014-11-10 ENCOUNTER — Observation Stay (HOSPITAL_COMMUNITY): Payer: Medicare Other

## 2014-11-10 ENCOUNTER — Observation Stay (HOSPITAL_COMMUNITY)
Admission: EM | Admit: 2014-11-10 | Discharge: 2014-11-12 | Disposition: A | Discharge status: Home / Self Care | Payer: Medicare Other | Attending: Internal Medicine | Admitting: Internal Medicine

## 2014-11-10 ENCOUNTER — Encounter (HOSPITAL_COMMUNITY): Payer: Self-pay | Admitting: Emergency Medicine

## 2014-11-10 DIAGNOSIS — I517 Cardiomegaly: Secondary | ICD-10-CM | POA: Diagnosis not present

## 2014-11-10 DIAGNOSIS — G40909 Epilepsy, unspecified, not intractable, without status epilepticus: Secondary | ICD-10-CM | POA: Diagnosis not present

## 2014-11-10 DIAGNOSIS — R0989 Other specified symptoms and signs involving the circulatory and respiratory systems: Secondary | ICD-10-CM | POA: Diagnosis not present

## 2014-11-10 DIAGNOSIS — Z951 Presence of aortocoronary bypass graft: Secondary | ICD-10-CM | POA: Diagnosis not present

## 2014-11-10 DIAGNOSIS — Z5900 Homelessness unspecified: Secondary | ICD-10-CM

## 2014-11-10 DIAGNOSIS — I2510 Atherosclerotic heart disease of native coronary artery without angina pectoris: Secondary | ICD-10-CM | POA: Insufficient documentation

## 2014-11-10 DIAGNOSIS — E872 Acidosis, unspecified: Secondary | ICD-10-CM | POA: Diagnosis present

## 2014-11-10 DIAGNOSIS — N400 Enlarged prostate without lower urinary tract symptoms: Secondary | ICD-10-CM | POA: Insufficient documentation

## 2014-11-10 DIAGNOSIS — R0602 Shortness of breath: Secondary | ICD-10-CM | POA: Diagnosis not present

## 2014-11-10 DIAGNOSIS — M160 Bilateral primary osteoarthritis of hip: Secondary | ICD-10-CM | POA: Diagnosis not present

## 2014-11-10 DIAGNOSIS — I129 Hypertensive chronic kidney disease with stage 1 through stage 4 chronic kidney disease, or unspecified chronic kidney disease: Secondary | ICD-10-CM | POA: Diagnosis not present

## 2014-11-10 DIAGNOSIS — N183 Chronic kidney disease, stage 3 unspecified: Secondary | ICD-10-CM | POA: Diagnosis present

## 2014-11-10 DIAGNOSIS — M5136 Other intervertebral disc degeneration, lumbar region: Secondary | ICD-10-CM | POA: Insufficient documentation

## 2014-11-10 DIAGNOSIS — Z7982 Long term (current) use of aspirin: Secondary | ICD-10-CM | POA: Insufficient documentation

## 2014-11-10 DIAGNOSIS — F1410 Cocaine abuse, uncomplicated: Secondary | ICD-10-CM | POA: Insufficient documentation

## 2014-11-10 DIAGNOSIS — R109 Unspecified abdominal pain: Secondary | ICD-10-CM | POA: Diagnosis not present

## 2014-11-10 DIAGNOSIS — F319 Bipolar disorder, unspecified: Secondary | ICD-10-CM | POA: Insufficient documentation

## 2014-11-10 DIAGNOSIS — F209 Schizophrenia, unspecified: Secondary | ICD-10-CM | POA: Insufficient documentation

## 2014-11-10 DIAGNOSIS — R079 Chest pain, unspecified: Secondary | ICD-10-CM | POA: Insufficient documentation

## 2014-11-10 DIAGNOSIS — G8929 Other chronic pain: Secondary | ICD-10-CM | POA: Diagnosis not present

## 2014-11-10 DIAGNOSIS — D631 Anemia in chronic kidney disease: Secondary | ICD-10-CM | POA: Diagnosis not present

## 2014-11-10 DIAGNOSIS — I701 Atherosclerosis of renal artery: Secondary | ICD-10-CM | POA: Diagnosis not present

## 2014-11-10 DIAGNOSIS — I5043 Acute on chronic combined systolic (congestive) and diastolic (congestive) heart failure: Secondary | ICD-10-CM | POA: Diagnosis not present

## 2014-11-10 DIAGNOSIS — Z886 Allergy status to analgesic agent status: Secondary | ICD-10-CM | POA: Insufficient documentation

## 2014-11-10 DIAGNOSIS — R197 Diarrhea, unspecified: Secondary | ICD-10-CM | POA: Insufficient documentation

## 2014-11-10 DIAGNOSIS — Z9119 Patient's noncompliance with other medical treatment and regimen: Secondary | ICD-10-CM | POA: Diagnosis not present

## 2014-11-10 DIAGNOSIS — I5021 Acute systolic (congestive) heart failure: Secondary | ICD-10-CM

## 2014-11-10 DIAGNOSIS — Z8719 Personal history of other diseases of the digestive system: Secondary | ICD-10-CM

## 2014-11-10 DIAGNOSIS — E876 Hypokalemia: Secondary | ICD-10-CM | POA: Diagnosis not present

## 2014-11-10 DIAGNOSIS — R748 Abnormal levels of other serum enzymes: Secondary | ICD-10-CM | POA: Diagnosis not present

## 2014-11-10 LAB — CBC
HEMATOCRIT: 27 % — AB (ref 39.0–52.0)
HEMOGLOBIN: 8.5 g/dL — AB (ref 13.0–17.0)
MCH: 26.9 pg (ref 26.0–34.0)
MCHC: 31.5 g/dL (ref 30.0–36.0)
MCV: 85.4 fL (ref 78.0–100.0)
Platelets: 345 10*3/uL (ref 150–400)
RBC: 3.16 MIL/uL — ABNORMAL LOW (ref 4.22–5.81)
RDW: 16.5 % — ABNORMAL HIGH (ref 11.5–15.5)
WBC: 5.9 10*3/uL (ref 4.0–10.5)

## 2014-11-10 LAB — BASIC METABOLIC PANEL
ANION GAP: 10 (ref 5–15)
ANION GAP: 13 (ref 5–15)
Anion gap: 7 (ref 5–15)
BUN: 31 mg/dL — ABNORMAL HIGH (ref 6–20)
BUN: 29 mg/dL — AB (ref 6–20)
BUN: 28 mg/dL — ABNORMAL HIGH (ref 6–20)
CHLORIDE: 108 mmol/L (ref 101–111)
CHLORIDE: 106 mmol/L (ref 101–111)
CHLORIDE: 103 mmol/L (ref 101–111)
CO2: 16 mmol/L — AB (ref 22–32)
CO2: 22 mmol/L (ref 22–32)
CO2: 19 mmol/L — ABNORMAL LOW (ref 22–32)
CREATININE: 1.38 mg/dL — AB (ref 0.61–1.24)
Calcium: 8.9 mg/dL (ref 8.9–10.3)
Calcium: 8.5 mg/dL — ABNORMAL LOW (ref 8.9–10.3)
Calcium: 8.4 mg/dL — ABNORMAL LOW (ref 8.9–10.3)
Creatinine, Ser: 1.37 mg/dL — ABNORMAL HIGH (ref 0.61–1.24)
Creatinine, Ser: 1.68 mg/dL — ABNORMAL HIGH (ref 0.61–1.24)
GFR calc Af Amer: 59 mL/min — ABNORMAL LOW (ref >60)
GFR calc Af Amer: 58 mL/min — ABNORMAL LOW (ref >60)
GFR calc non Af Amer: 50 mL/min — ABNORMAL LOW (ref >60)
GFR calc non Af Amer: 40 mL/min — ABNORMAL LOW (ref >60)
GFR, EST AFRICAN AMERICAN: 46 mL/min — AB (ref >60)
GFR, EST NON AFRICAN AMERICAN: 51 mL/min — AB (ref >60)
GLUCOSE: 102 mg/dL — AB (ref 65–99)
Glucose, Bld: 100 mg/dL — ABNORMAL HIGH (ref 65–99)
Glucose, Bld: 100 mg/dL — ABNORMAL HIGH (ref 65–99)
POTASSIUM: 4.2 mmol/L (ref 3.5–5.1)
POTASSIUM: 4 mmol/L (ref 3.5–5.1)
POTASSIUM: 3.6 mmol/L (ref 3.5–5.1)
SODIUM: 135 mmol/L (ref 135–145)
SODIUM: 135 mmol/L (ref 135–145)
Sodium: 134 mmol/L — ABNORMAL LOW (ref 135–145)

## 2014-11-10 LAB — RAPID URINE DRUG SCREEN, HOSP PERFORMED
AMPHETAMINES: NOT DETECTED
BARBITURATES: NOT DETECTED
Benzodiazepines: NOT DETECTED
COCAINE: POSITIVE — AB
OPIATES: NOT DETECTED
TETRAHYDROCANNABINOL: NOT DETECTED

## 2014-11-10 LAB — I-STAT TROPONIN, ED: Troponin i, poc: 0.04 ng/mL (ref 0.00–0.08)

## 2014-11-10 LAB — TROPONIN I
TROPONIN I: 0.06 ng/mL — AB (ref <0.031)
Troponin I: 0.06 ng/mL — ABNORMAL HIGH (ref <0.031)

## 2014-11-10 LAB — BRAIN NATRIURETIC PEPTIDE: B Natriuretic Peptide: 3012.2 pg/mL — ABNORMAL HIGH (ref 0.0–100.0)

## 2014-11-10 LAB — MAGNESIUM: MAGNESIUM: 1.8 mg/dL (ref 1.7–2.4)

## 2014-11-10 LAB — MRSA PCR SCREENING: MRSA by PCR: POSITIVE — AB

## 2014-11-10 MED ORDER — PHENYTOIN SODIUM EXTENDED 100 MG PO CAPS
200.0000 mg | ORAL_CAPSULE | Freq: Two times a day (BID) | ORAL | Status: DC
  Administered 2014-11-10 – 2014-11-12 (×5): 200 mg via ORAL
  Filled 2014-11-10 (×5): qty 2

## 2014-11-10 MED ORDER — NITROGLYCERIN 0.4 MG SL SUBL
0.4000 mg | SUBLINGUAL_TABLET | SUBLINGUAL | Status: AC | PRN
  Administered 2014-11-10 (×3): 0.4 mg via SUBLINGUAL

## 2014-11-10 MED ORDER — ATORVASTATIN CALCIUM 80 MG PO TABS
80.0000 mg | ORAL_TABLET | Freq: Every day | ORAL | Status: DC
  Administered 2014-11-10: 80 mg via ORAL
  Filled 2014-11-10 (×2): qty 1

## 2014-11-10 MED ORDER — FERROUS SULFATE 325 (65 FE) MG PO TABS
325.0000 mg | ORAL_TABLET | Freq: Two times a day (BID) | ORAL | Status: DC
  Administered 2014-11-10 – 2014-11-12 (×5): 325 mg via ORAL
  Filled 2014-11-10 (×7): qty 1

## 2014-11-10 MED ORDER — AMLODIPINE BESYLATE 10 MG PO TABS
10.0000 mg | ORAL_TABLET | Freq: Every day | ORAL | Status: DC
  Administered 2014-11-10 – 2014-11-12 (×3): 10 mg via ORAL
  Filled 2014-11-10 (×2): qty 1
  Filled 2014-11-10: qty 2

## 2014-11-10 MED ORDER — DICLOFENAC SODIUM 1 % TD GEL: 4.0000 g | Freq: Four times a day (QID) | TRANSDERMAL | Status: DC

## 2014-11-10 MED ORDER — METOLAZONE 5 MG PO TABS
5.0000 mg | ORAL_TABLET | Freq: Every day | ORAL | Status: DC | PRN
  Filled 2014-11-10: qty 1

## 2014-11-10 MED ORDER — SODIUM CHLORIDE 0.9 % IJ SOLN
3.0000 mL | Freq: Two times a day (BID) | INTRAMUSCULAR | Status: DC
  Administered 2014-11-10 – 2014-11-11 (×3): 3 mL via INTRAVENOUS
  Filled 2014-11-10: qty 3

## 2014-11-10 MED ORDER — CYCLOBENZAPRINE HCL 5 MG PO TABS
5.0000 mg | ORAL_TABLET | Freq: Once | ORAL | Status: AC
  Administered 2014-11-10: 5 mg via ORAL
  Filled 2014-11-10: qty 1

## 2014-11-10 MED ORDER — ALBUTEROL SULFATE (2.5 MG/3ML) 0.083% IN NEBU: 2.5000 mg | INHALATION_SOLUTION | Freq: Four times a day (QID) | RESPIRATORY_TRACT | Status: DC | PRN

## 2014-11-10 MED ORDER — ALBUTEROL SULFATE HFA 108 (90 BASE) MCG/ACT IN AERS: 2.0000 | INHALATION_SPRAY | Freq: Four times a day (QID) | RESPIRATORY_TRACT | Status: DC | PRN

## 2014-11-10 MED ORDER — MORPHINE SULFATE (PF) 2 MG/ML IV SOLN
1.0000 mg | Freq: Once | INTRAVENOUS | Status: AC
  Administered 2014-11-10: 1 mg via INTRAVENOUS

## 2014-11-10 MED ORDER — SODIUM BICARBONATE 650 MG PO TABS
650.0000 mg | ORAL_TABLET | Freq: Once | ORAL | Status: AC
  Administered 2014-11-10: 650 mg via ORAL
  Filled 2014-11-10 (×2): qty 1

## 2014-11-10 MED ORDER — PANTOPRAZOLE SODIUM 40 MG IV SOLR: 40.0000 mg | Freq: Two times a day (BID) | INTRAVENOUS | Status: DC

## 2014-11-10 MED ORDER — DIVALPROEX SODIUM ER 500 MG PO TB24
500.0000 mg | ORAL_TABLET | Freq: Two times a day (BID) | ORAL | Status: DC
  Administered 2014-11-10 – 2014-11-12 (×5): 500 mg via ORAL
  Filled 2014-11-10 (×6): qty 1

## 2014-11-10 MED ORDER — ACETAMINOPHEN 325 MG PO TABS
650.0000 mg | ORAL_TABLET | Freq: Once | ORAL | Status: AC
  Administered 2014-11-10: 650 mg via ORAL
  Filled 2014-11-10: qty 2

## 2014-11-10 MED ORDER — NITROGLYCERIN 0.4 MG SL SUBL: 0.4000 mg | SUBLINGUAL_TABLET | SUBLINGUAL | Status: DC | PRN

## 2014-11-10 MED ORDER — SODIUM CHLORIDE 0.9 % IJ SOLN: 3.0000 mL | INTRAMUSCULAR | Status: DC | PRN

## 2014-11-10 MED ORDER — POTASSIUM CHLORIDE CRYS ER 20 MEQ PO TBCR
20.0000 meq | EXTENDED_RELEASE_TABLET | Freq: Every day | ORAL | Status: DC
  Administered 2014-11-10 – 2014-11-12 (×3): 20 meq via ORAL
  Filled 2014-11-10 (×3): qty 1

## 2014-11-10 MED ORDER — MORPHINE SULFATE (PF) 2 MG/ML IV SOLN
1.0000 mg | INTRAVENOUS | Status: DC | PRN
  Administered 2014-11-10 – 2014-11-12 (×9): 1 mg via INTRAVENOUS
  Filled 2014-11-10 (×12): qty 1

## 2014-11-10 MED ORDER — MORPHINE PF NICU INJ SYRINGE 0.5 MG/ML: 0.1000 mg/kg | Freq: Once | INTRAMUSCULAR | Status: DC

## 2014-11-10 MED ORDER — FUROSEMIDE 10 MG/ML IJ SOLN
40.0000 mg | Freq: Once | INTRAMUSCULAR | Status: AC
  Administered 2014-11-10: 40 mg via INTRAVENOUS
  Filled 2014-11-10: qty 4

## 2014-11-10 MED ORDER — CITALOPRAM HYDROBROMIDE 20 MG PO TABS
20.0000 mg | ORAL_TABLET | Freq: Every day | ORAL | Status: DC
  Administered 2014-11-10 – 2014-11-12 (×3): 20 mg via ORAL
  Filled 2014-11-10: qty 1
  Filled 2014-11-10: qty 2
  Filled 2014-11-10: qty 1

## 2014-11-10 MED ORDER — BUDESONIDE-FORMOTEROL FUMARATE 80-4.5 MCG/ACT IN AERO
2.0000 | INHALATION_SPRAY | Freq: Two times a day (BID) | RESPIRATORY_TRACT | Status: DC
  Administered 2014-11-10 – 2014-11-12 (×5): 2 via RESPIRATORY_TRACT
  Filled 2014-11-10 (×2): qty 6.9

## 2014-11-10 MED ORDER — ASPIRIN EC 81 MG PO TBEC
81.0000 mg | DELAYED_RELEASE_TABLET | Freq: Every day | ORAL | Status: DC
  Administered 2014-11-10 – 2014-11-12 (×3): 81 mg via ORAL
  Filled 2014-11-10 (×3): qty 1

## 2014-11-10 MED ORDER — SODIUM BICARBONATE 650 MG PO TABS
1300.0000 mg | ORAL_TABLET | Freq: Three times a day (TID) | ORAL | Status: DC
  Administered 2014-11-10 – 2014-11-12 (×7): 1300 mg via ORAL
  Filled 2014-11-10 (×9): qty 2

## 2014-11-10 MED ORDER — HYDRALAZINE HCL 50 MG PO TABS
100.0000 mg | ORAL_TABLET | Freq: Three times a day (TID) | ORAL | Status: DC
  Administered 2014-11-10 – 2014-11-12 (×6): 100 mg via ORAL
  Filled 2014-11-10 (×2): qty 2
  Filled 2014-11-10: qty 4
  Filled 2014-11-10 (×7): qty 2

## 2014-11-10 MED ORDER — PANTOPRAZOLE SODIUM 40 MG PO TBEC
40.0000 mg | DELAYED_RELEASE_TABLET | Freq: Every day | ORAL | Status: DC
  Administered 2014-11-10: 40 mg via ORAL
  Filled 2014-11-10: qty 1

## 2014-11-10 MED ORDER — SODIUM BICARBONATE 650 MG PO TABS
650.0000 mg | ORAL_TABLET | Freq: Two times a day (BID) | ORAL | Status: DC
  Administered 2014-11-10: 650 mg via ORAL
  Filled 2014-11-10 (×2): qty 1

## 2014-11-10 MED ORDER — ACETAMINOPHEN 325 MG PO TABS
650.0000 mg | ORAL_TABLET | ORAL | Status: DC | PRN
  Administered 2014-11-11: 650 mg via ORAL
  Filled 2014-11-10: qty 2

## 2014-11-10 MED ORDER — ISOSORBIDE MONONITRATE ER 60 MG PO TB24
60.0000 mg | ORAL_TABLET | Freq: Every day | ORAL | Status: DC
  Administered 2014-11-10 – 2014-11-12 (×3): 60 mg via ORAL
  Filled 2014-11-10 (×3): qty 1

## 2014-11-10 MED ORDER — ONDANSETRON HCL 4 MG/2ML IJ SOLN
4.0000 mg | Freq: Four times a day (QID) | INTRAMUSCULAR | Status: DC | PRN
  Administered 2014-11-10: 4 mg via INTRAVENOUS
  Filled 2014-11-10: qty 2

## 2014-11-10 MED ORDER — TORSEMIDE 20 MG PO TABS
40.0000 mg | ORAL_TABLET | Freq: Every day | ORAL | Status: DC
  Administered 2014-11-10 – 2014-11-12 (×3): 40 mg via ORAL
  Filled 2014-11-10 (×3): qty 2

## 2014-11-10 MED ORDER — SODIUM CHLORIDE 0.9 % IV SOLN: 250.0000 mL | INTRAVENOUS | Status: DC | PRN

## 2014-11-10 MED ORDER — PANTOPRAZOLE SODIUM 40 MG IV SOLR
40.0000 mg | Freq: Two times a day (BID) | INTRAVENOUS | Status: DC
  Administered 2014-11-10 – 2014-11-11 (×3): 40 mg via INTRAVENOUS
  Filled 2014-11-10 (×4): qty 40

## 2014-11-10 MED ORDER — LORAZEPAM 0.5 MG PO TABS
0.5000 mg | ORAL_TABLET | Freq: Four times a day (QID) | ORAL | Status: DC | PRN
  Administered 2014-11-12: 0.5 mg via ORAL
  Filled 2014-11-10: qty 1

## 2014-11-10 NOTE — ED Notes (Signed)
Pt has not had BM since arrival to [facility].

## 2014-11-10 NOTE — Progress Notes (Signed)
Patient seen and examined. He is still complaining of chest pain, burning in quality. Also abdominal pain, nausea. Report watery stool for last few days.   1-Metabolic acidosis; could be related to diarrhea, renal failure Start oral bicarb. Avoid IV fluids to avoid HF exacerbation.   Acute on chronic combined systolic and diastolic CHF Continue with torsemide. Received one dose of IV lasix in the ED  Chest pain; cycle enzymes. IV ativan PRN. IV Protonix.   Abdominal pain , diarrhea; check for C diff. KUB.   CKD stage 3 Stable. Monitor on lasix.   H/O GIB due to AVMs  SCDs only for DVT ppx Follow CBCs HGB today is 8.5  Cocaine abuse -counseling provided.   H/o seizures - continue with Depakote.  Homelessness - current "home" situation involves several other people who are also doing cocaine, patient states that this is probably his trigger and not conducive to quitting, have SW consult placed.  Charles Hemrick, Md.

## 2014-11-10 NOTE — ED Notes (Signed)
Pt being transported to x-ray

## 2014-11-10 NOTE — Progress Notes (Signed)
Pt positive-MRSA

## 2014-11-10 NOTE — ED Notes (Addendum)
Successfully doplered both left/right pulses in feet.

## 2014-11-10 NOTE — ED Notes (Signed)
Patient c/o of feeling nauseated and spitting clear thin liquid in to emesis bag.  See PRN orders.

## 2014-11-10 NOTE — ED Provider Notes (Signed)
CSN: [id number]   Arrival date & time 11/10/14 0047  History  This chart was scribed for Ripley Fraise, MD by Altamease Oiler, ED Scribe. This patient was seen in room A06C/A06C and the patient's care was started at 3:53 AM.  Chief Complaint  Patient presents with  . Chest Pain    HPI Patient is a 69 y.o. male presenting with chest pain. The history is provided by the patient. No language interpreter was used.  Chest Pain Chest pain location: Diffuse. Onset quality:  Gradual Duration:  9 hours Timing:  Constant Progression:  Unchanged Chronicity:  New Relieved by:  Nothing Worsened by:  Nothing tried Ineffective treatments:  None tried Associated symptoms: abdominal pain, cough, dizziness, nausea, numbness, shortness of breath and vomiting   Associated symptoms: no weakness    Charles [name] is a 69 y.o. male with PMHx of CHF, MI, HTN, HLD, PVD, CKD, COPD, and chronic back pain who presents to the Emergency Department complaining of constant and diffuse chest pain with gradual onset around 8:30 PM last night. He rates the pain 10/10 in severity. Associated symptoms include dizziness, SOB, cough, abdominal pain, nausea, vomiting, neck pain, back pain, and numbness in the toes (for 2 days). Pt denies hemoptysis and weakness. He had no chest pain with previous MIs. Last used cocaine 6 days ago.   Past Medical History  Diagnosis Date  . Iron deficiency anemia   . Chronic combined systolic and diastolic CHF, NYHA class 2     a. 11/2013 Echo: EF 40-45%, basl-mid inflat AK, Gr 2 DD.  [name] Hyperlipidemia LDL goal <70   . Ischemic cardiomyopathy     a. 11/2013 Echo: EF 40-45%, basl-mid inflat AK, Gr 2 DD, mild AI, mod dil LA/RA, mod reduced RV fxn, PASP 71mmHg.  . Moderate to severe pulmonary hypertension 03/2010    a. PA peak pressure of 76 mmHg (per 2D Echo 03/2010)  . Polysubstance abuse     a. cocaine, THC  . AV malformation of gastrointestinal tract     a. w/ h/o GIB.  [name] Family  history of early CAD   . Peripheral vascular disease   . Seizure disorder   . Hypertension   . CAD (coronary artery disease)     a. s/p 3-vessel CABG (12/2007) // 100% RCA stenosis with collaterals from left system. Severe bifurcation lesions of proxima CXA and OM. Moderate LAD disease - followed by Dr. Wyline Copas in [facility];  b. 11/2013 Myoview: Large fixed inf defect w/o ischemia, EF 35%.  . Chronic kidney disease (CKD), stage III (moderate)     a. BL SCr 1.5-1.6  . Renal artery stenosis   . Cancer     "I have cancer; right now it's not known what kind" (09/17/2014)  . Myocardial infarction 08/2007; 12/2007  . Anginal pain   . COPD (chronic obstructive pulmonary disease)   . Pneumonia     "I've had pneumonia once" (09/17/2014)  . Obstructive sleep apnea     "never told me to wear mask" (09/17/2014)  . History of blood transfusion "I've had quite a few"    "they don't know where it's coming from" (09/17/2014)  . History of hiatal hernia   . Seizures     "I've had a few; don't know what kind they call it" (09/17/2014)  . DDD (degenerative disc disease), lumbar   . Arthritis     "ankles; left hip; wrists" (09/17/2014)  . Chronic lower back pain   . Depression   .  Bipolar disorder   . Schizophrenia   . BPH (benign prostatic hyperplasia)     Past Surgical History  Procedure Laterality Date  . Apc  03/2010    To treat small bowel AVMs  . Esophagogastroduodenoscopy N/A 08/14/2012    Procedure: ESOPHAGOGASTRODUODENOSCOPY (EGD);  Surgeon: [name], MD;  Location: [facility] ENDOSCOPY;  Service: Endoscopy;  Laterality: N/A;  . Hot hemostasis N/A 08/14/2012    Procedure: HOT HEMOSTASIS (ARGON PLASMA COAGULATION/BICAP);  Surgeon: [name], MD;  Location: [facility] ENDOSCOPY;  Service: Endoscopy;  Laterality: N/A;  . Esophagogastroduodenoscopy (egd) with propofol N/A 08/04/2013    Procedure: ESOPHAGOGASTRODUODENOSCOPY (EGD) WITH PROPOFOL;  Surgeon: [name], MD;  Location: [facility] ENDOSCOPY;  Service:  Endoscopy;  Laterality: N/A;  . Esophagogastroduodenoscopy (egd) with propofol N/A 06/08/2014    Procedure: ESOPHAGOGASTRODUODENOSCOPY (EGD) WITH PROPOFOL;  Surgeon: Milus Banister, MD;  Location: [place];  Service: Endoscopy;  Laterality: N/A;  . Peripheral vascular catheterization N/A 09/07/2014    Procedure: Abdominal Aortogram;  Surgeon: Angelia Mould, MD;  Location: [place] CV LAB;  Service: Cardiovascular;  Laterality: N/A;  . Coronary artery bypass graft  12/2007    "CABG X3"  . Coronary angioplasty with stent placement  08/2007  . Enteroscopy N/A 10/16/2014    Procedure: ENTEROSCOPY;  Surgeon: [name], MD;  Location: [facility] ENDOSCOPY;  Service: Endoscopy;  Laterality: N/A;    Family History  Problem Relation Age of Onset  . Heart disease Mother     unknown type  . Heart disease Father 29    died of MI at 68yo  . Heart disease Paternal Grandfather 75    died of MI  . Heart disease    . Heart disease Brother 50    Social History  Substance Use Topics  . Smoking status: Former Smoker -- 0.25 packs/day for 10 years    Types: Cigarettes  . Smokeless tobacco: Never Used     Comment: "quit smoking cigarettes in 2009; smoked off and on since I was 13"  . Alcohol Use: No     Comment: 09/17/2014 "maybe 4 beers/month, 12 oz cans"     Review of Systems  Respiratory: Positive for cough and shortness of breath.   Cardiovascular: Positive for chest pain.  Gastrointestinal: Positive for nausea, vomiting and abdominal pain.  Neurological: Positive for dizziness and numbness. Negative for weakness.  All other systems reviewed and are negative.   Home Medications   Prior to Admission medications   Medication Sig Start Date End Date Taking? Authorizing Provider  albuterol (PROVENTIL HFA;VENTOLIN HFA) 108 (90 BASE) MCG/ACT inhaler Inhale 2 puffs into the lungs every 6 (six) hours as needed for wheezing or shortness of breath. 04/30/14   Thurnell Lose, MD  amLODipine  (NORVASC) 10 MG tablet Take 1 tablet (10 mg total) by mouth daily. 09/18/14   Shanker Kristeen Mans, MD  aspirin EC 81 MG tablet Take 1 tablet (81 mg total) by mouth daily. RESUME AFTER 1 WEEK. 10/18/14   Bonnielee Haff, MD  atorvastatin (LIPITOR) 80 MG tablet Take 1 tablet (80 mg total) by mouth daily at 6 PM. 10/07/14   Charlynne Cousins, MD  budesonide-formoterol [facility]) 80-4.5 MCG/ACT inhaler Inhale 2 puffs into the lungs 2 (two) times daily. 09/08/14   Ripudeep Krystal Eaton, MD  citalopram (CELEXA) 20 MG tablet Take 20 mg by mouth daily.    Historical Provider, MD  diclofenac sodium (VOLTAREN) 1 % GEL Apply 4 g topically 4 (four) times daily.  09/22/14   April Palumbo, MD  divalproex (DEPAKOTE ER) 500 MG 24 hr tablet Take 500 mg by mouth 2 (two) times daily. 05/29/14   Historical Provider, MD  ferrous sulfate 325 (65 FE) MG tablet Take 325 mg by mouth 2 (two) times daily with a meal.     Historical Provider, MD  hydrALAZINE (APRESOLINE) 100 MG tablet Take 100 mg by mouth 3 (three) times daily. 05/29/14   Historical Provider, MD  HYDROcodone-acetaminophen (NORCO/VICODIN) 5-325 MG per tablet Take 1-2 tablets by mouth every 6 (six) hours as needed for moderate pain. 10/18/14   Bonnielee Haff, MD  isosorbide mononitrate (IMDUR) 30 MG 24 hr tablet Take 2 tablets (60 mg total) by mouth daily. 07/26/14   Modena Jansky, MD  metolazone (ZAROXOLYN) 5 MG tablet Take 1 tablet (5 mg total) by mouth as needed (for weight gain of 2 lbs in 2-3 days). 10/18/14   Bonnielee Haff, MD  nitroGLYCERIN (NITROSTAT) 0.4 MG SL tablet Place 1 tablet (0.4 mg total) under the tongue every 5 (five) minutes as needed for chest pain (upto max 3 doses at one time.). 07/26/14   Modena Jansky, MD  pantoprazole (PROTONIX) 40 MG tablet Take 40 mg by mouth daily.     Historical Provider, MD  phenytoin (DILANTIN) 200 MG ER capsule Take 1 capsule (200 mg total) by mouth 2 (two) times daily. 10/31/14   Janece Canterbury, MD  potassium chloride SA  (KLOR-CON M20) 20 MEQ tablet Take 20 mEq by mouth daily. 01/17/10   Historical Provider, MD  torsemide (DEMADEX) 20 MG tablet Take 2 tablets (40 mg total) by mouth daily. 10/18/14   Bonnielee Haff, MD    Allergies  Motrin and Tylenol  Triage Vitals: BP 160/90 mmHg  Pulse 88  Temp(Src) 97.4 F (36.3 C) (Oral)  Resp 36  Ht 5\' 5"  (1.651 m)  Wt 165 lb (74.844 kg)  BMI 27.46 kg/m2  SpO2 97%  Physical Exam CONSTITUTIONAL: disheveled HEAD: Normocephalic/atraumatic EYES: EOMI/PERRL ENMT: Mucous membranes moist NECK: supple no meningeal signs SPINE/BACK:entire spine nontender CV: S1/S2 noted, no murmurs/rubs/gallops noted LUNGS: coarse BS noted bilaterally ABDOMEN: soft, nontender, no rebound or guarding, bowel sounds noted throughout abdomen GU:no cva tenderness NEURO: Pt is awake/alert/appropriate, moves all extremitiesx4.  No facial droop.   EXTREMITIES:full ROM, dopplerable pulses in both feet per nursing SKIN: warm, color normal PSYCH: no abnormalities of mood noted, alert and oriented to situation  ED Course  Procedures   DIAGNOSTIC STUDIES: Oxygen Saturation is 97% on RA, normal by my interpretation.    COORDINATION OF CARE: 3:57 AM Discussed treatment plan which includes CXR, lab work, and EKG with pt at bedside and pt agreed to plan.  5:43 AM Pt with long h/o CAD/CHF with questionable compliance and cocaine abuse presenting with worsening CP/SOB Initial troponin negative Suspect he is starting to decompensate with CHF NTG and lasix ordered for patient I have low suspicion for PE/Dissection at this time Will admit to medicine for CHF D/w dr gardner will admit    Labs Reviewed  BASIC METABOLIC PANEL - Abnormal; Notable for the following:    Sodium 134 (*)    CO2 16 (*)    Glucose, Bld 102 (*)    BUN 31 (*)    Creatinine, Ser 1.37 (*)    GFR calc non Af Amer 51 (*)    GFR calc Af Amer 59 (*)    All other components within normal limits  CBC - Abnormal;  Notable for  the following:    RBC 3.16 (*)    Hemoglobin 8.5 (*)    HCT 27.0 (*)    RDW 16.5 (*)    All other components within normal limits  BRAIN NATRIURETIC PEPTIDE - Abnormal; Notable for the following:    B Natriuretic Peptide 3012.2 (*)    All other components within normal limits  URINE RAPID DRUG SCREEN, HOSP PERFORMED  I-STAT TROPOININ, ED    I, Ripley Fraise, MD, personally reviewed and evaluated these images and lab results as part of my medical decision-making.  Imaging Review Dg Chest 2 View  11/10/2014   CLINICAL DATA:  Chest pain, shortness of breath, abdominal pain.  EXAM: CHEST  2 VIEW  COMPARISON:  11/09/2014  FINDINGS: Cardiac enlargement with mild vascular congestion. No edema or consolidation. No blunting of costophrenic angles. No pneumothorax. Postoperative changes in the mediastinum. Degenerative changes in the spine and shoulders.  IMPRESSION: Cardiac enlargement with pulmonary vascular congestion.  No edema.   Electronically Signed   By: Lucienne Capers M.D.   On: 11/10/2014 01:46   Dg Chest 2 View  11/09/2014   CLINICAL DATA:  Left-sided chest pain, productive cough. History of hypertension and coronary artery disease.  EXAM: CHEST  2 VIEW  COMPARISON:  11/08/2014  FINDINGS: Moderate enlargement of the cardiac silhouette noted with central vascular congestion. Mild prominence of the interstitial markings is identified bilateral without focal consolidation. No pleural effusion. No acute osseous abnormality.  IMPRESSION: Moderate cardiomegaly with central vascular congestion and borderline interstitial edema.   Electronically Signed   By: Conchita Paris M.D.   On: 11/09/2014 07:35    EKG Interpretation  Date/Time:  Tuesday November 10 2014 01:08:22 EDT Ventricular Rate:  87 PR Interval:  160 QRS Duration: 120 QT Interval:  440 QTC Calculation: 529 R Axis:   83 Text Interpretation:  Sinus rhythm with occasional Premature ventricular complexes Possible  Inferior infarct , age undetermined Abnormal ECG Confirmed by Christy Gentles  MD, Conor Lata ([id number]) on 11/10/2014 1:14:47 AM       MDM   Final diagnoses:  Acute systolic congestive heart failure  Metabolic acidosis     Nursing notes including past medical history and social history reviewed and considered in documentation xrays/imaging reviewed by myself and considered during evaluation Labs/vital reviewed myself and considered during evaluation   I personally performed the services described in this documentation, which was scribed in my presence. The recorded information has been reviewed and is accurate.    Ripley Fraise, MD 11/10/14 [phone]

## 2014-11-10 NOTE — ED Notes (Signed)
Patient placed on 2L nasal cannula.  Patient desats to low 90's.

## 2014-11-10 NOTE — ED Notes (Signed)
Per ems-- pt has care plan for chf. Pt c/o back pain and headache, cough and L hip pain with this ems trip. Now c/o cp/sob once in triage. Pt seen and lwbs from [place] today.

## 2014-11-10 NOTE — H&P (Signed)
[facility] History and Physical  Izaya Netherton UMP:[id number] DOB: [birth year] DOA: 11/10/2014  Referring physician: EDP PCP: No PCP Per Patient   Chief Complaint: CP / SOB   HPI: Charles [name] is a 69 y.o. male well known to our service from prior admits.  He is a 69 yo M with h/o CAD including CABG, unfortunately in addition to not being the most compliant with his meds, he also continues to battle cocaine addiction which his heart simply cannot tolerate (see numerous prior notes by our service and cardiology).  He states he last used cocaine "a couple of days ago" his UDS on the 21st was positive for cocaine.  Today he presents with CP and SOB.  This has been ongoing over the past couple of days and has not improved.  Review of Systems: Systems reviewed.  As above, otherwise negative  Past Medical History  Diagnosis Date  . Iron deficiency anemia   . Chronic combined systolic and diastolic CHF, NYHA class 2     a. 11/2013 Echo: EF 40-45%, basl-mid inflat AK, Gr 2 DD.  [name] Hyperlipidemia LDL goal <70   . Ischemic cardiomyopathy     a. 11/2013 Echo: EF 40-45%, basl-mid inflat AK, Gr 2 DD, mild AI, mod dil LA/RA, mod reduced RV fxn, PASP 11mmHg.  . Moderate to severe pulmonary hypertension 03/2010    a. PA peak pressure of 76 mmHg (per 2D Echo 03/2010)  . Polysubstance abuse     a. cocaine, THC  . AV malformation of gastrointestinal tract     a. w/ h/o GIB.  [name] Family history of early CAD   . Peripheral vascular disease   . Seizure disorder   . Hypertension   . CAD (coronary artery disease)     a. s/p 3-vessel CABG (12/2007) // 100% RCA stenosis with collaterals from left system. Severe bifurcation lesions of proxima CXA and OM. Moderate LAD disease - followed by Dr. Wyline Copas in [facility];  b. 11/2013 Myoview: Large fixed inf defect w/o ischemia, EF 35%.  . Chronic kidney disease (CKD), stage III (moderate)     a. BL SCr 1.5-1.6  . Renal artery stenosis   . Cancer     "I have cancer; right now it's not known what kind" (09/17/2014)  . Myocardial infarction 08/2007; 12/2007  . Anginal pain   . COPD (chronic obstructive pulmonary disease)   . Pneumonia     "I've had pneumonia once" (09/17/2014)  . Obstructive sleep apnea     "never told me to wear mask" (09/17/2014)  . History of blood transfusion "I've had quite a few"    "they don't know where it's coming from" (09/17/2014)  . History of hiatal hernia   . Seizures     "I've had a few; don't know what kind they call it" (09/17/2014)  . DDD (degenerative disc disease), lumbar   . Arthritis     "ankles; left hip; wrists" (09/17/2014)  . Chronic lower back pain   . Depression   . Bipolar disorder   . Schizophrenia   . BPH (benign prostatic hyperplasia)    Past Surgical History  Procedure Laterality Date  . Apc  03/2010    To treat small bowel AVMs  . Esophagogastroduodenoscopy N/A 08/14/2012    Procedure: ESOPHAGOGASTRODUODENOSCOPY (EGD);  Surgeon: [name], MD;  Location: [facility] ENDOSCOPY;  Service: Endoscopy;  Laterality: N/A;  . Hot hemostasis N/A 08/14/2012    Procedure: HOT HEMOSTASIS (ARGON PLASMA COAGULATION/BICAP);  Surgeon:  [name], MD;  Location: [place];  Service: Endoscopy;  Laterality: N/A;  . Esophagogastroduodenoscopy (egd) with propofol N/A 08/04/2013    Procedure: ESOPHAGOGASTRODUODENOSCOPY (EGD) WITH PROPOFOL;  Surgeon: [name], MD;  Location: [facility] ENDOSCOPY;  Service: Endoscopy;  Laterality: N/A;  . Esophagogastroduodenoscopy (egd) with propofol N/A 06/08/2014    Procedure: ESOPHAGOGASTRODUODENOSCOPY (EGD) WITH PROPOFOL;  Surgeon: Milus Banister, MD;  Location: [place];  Service: Endoscopy;  Laterality: N/A;  . Peripheral vascular catheterization N/A 09/07/2014    Procedure: Abdominal Aortogram;  Surgeon: Angelia Mould, MD;  Location: [place] CV LAB;  Service: Cardiovascular;  Laterality: N/A;  . Coronary artery bypass graft  12/2007    "CABG X3"  .  Coronary angioplasty with stent placement  08/2007  . Enteroscopy N/A 10/16/2014    Procedure: ENTEROSCOPY;  Surgeon: [name], MD;  Location: [facility] ENDOSCOPY;  Service: Endoscopy;  Laterality: N/A;   Social History:  reports that he has quit smoking. His smoking use included Cigarettes. He has a 2.5 pack-year smoking history. He has never used smokeless tobacco. He reports that he uses illicit drugs (Cocaine and Marijuana) about once per week. He reports that he does not drink alcohol.  Allergies  Allergen Reactions  . Motrin [Ibuprofen] Other (See Comments)    Affects kidneys  . Tylenol [Acetaminophen] Other (See Comments)    Affects kidneys    Family History  Problem Relation Age of Onset  . Heart disease Mother     unknown type  . Heart disease Father 54    died of MI at 67yo  . Heart disease Paternal Grandfather 43    died of MI  . Heart disease    . Heart disease Brother 30     Prior to Admission medications   Medication Sig Start Date End Date Taking? Authorizing Provider  albuterol (PROVENTIL HFA;VENTOLIN HFA) 108 (90 BASE) MCG/ACT inhaler Inhale 2 puffs into the lungs every 6 (six) hours as needed for wheezing or shortness of breath. 04/30/14   Thurnell Lose, MD  amLODipine (NORVASC) 10 MG tablet Take 1 tablet (10 mg total) by mouth daily. 09/18/14   Shanker Kristeen Mans, MD  aspirin EC 81 MG tablet Take 1 tablet (81 mg total) by mouth daily. RESUME AFTER 1 WEEK. 10/18/14   Bonnielee Haff, MD  atorvastatin (LIPITOR) 80 MG tablet Take 1 tablet (80 mg total) by mouth daily at 6 PM. 10/07/14   Charlynne Cousins, MD  budesonide-formoterol [facility]) 80-4.5 MCG/ACT inhaler Inhale 2 puffs into the lungs 2 (two) times daily. 09/08/14   Ripudeep Krystal Eaton, MD  citalopram (CELEXA) 20 MG tablet Take 20 mg by mouth daily.    Historical Provider, MD  diclofenac sodium (VOLTAREN) 1 % GEL Apply 4 g topically 4 (four) times daily. 09/22/14   April Palumbo, MD  divalproex (DEPAKOTE ER) 500 MG 24  hr tablet Take 500 mg by mouth 2 (two) times daily. 05/29/14   Historical Provider, MD  ferrous sulfate 325 (65 FE) MG tablet Take 325 mg by mouth 2 (two) times daily with a meal.     Historical Provider, MD  hydrALAZINE (APRESOLINE) 100 MG tablet Take 100 mg by mouth 3 (three) times daily. 05/29/14   Historical Provider, MD  HYDROcodone-acetaminophen (NORCO/VICODIN) 5-325 MG per tablet Take 1-2 tablets by mouth every 6 (six) hours as needed for moderate pain. 10/18/14   Bonnielee Haff, MD  isosorbide mononitrate (IMDUR) 30 MG 24 hr tablet Take 2 tablets (60 mg  total) by mouth daily. 07/26/14   Modena Jansky, MD  metolazone (ZAROXOLYN) 5 MG tablet Take 1 tablet (5 mg total) by mouth as needed (for weight gain of 2 lbs in 2-3 days). 10/18/14   Bonnielee Haff, MD  nitroGLYCERIN (NITROSTAT) 0.4 MG SL tablet Place 1 tablet (0.4 mg total) under the tongue every 5 (five) minutes as needed for chest pain (upto max 3 doses at one time.). 07/26/14   Modena Jansky, MD  pantoprazole (PROTONIX) 40 MG tablet Take 40 mg by mouth daily.     Historical Provider, MD  phenytoin (DILANTIN) 200 MG ER capsule Take 1 capsule (200 mg total) by mouth 2 (two) times daily. 10/31/14   Janece Canterbury, MD  potassium chloride SA (KLOR-CON M20) 20 MEQ tablet Take 20 mEq by mouth daily. 01/17/10   Historical Provider, MD  torsemide (DEMADEX) 20 MG tablet Take 2 tablets (40 mg total) by mouth daily. 10/18/14   Bonnielee Haff, MD   Physical Exam: Filed Vitals:   11/10/14 0322  BP: 160/90  Pulse: 88  Temp:   Resp: 36    BP 160/90 mmHg  Pulse 88  Temp(Src) 97.4 F (36.3 C) (Oral)  Resp 36  Ht 5\' 5"  (1.651 m)  Wt 74.844 kg (165 lb)  BMI 27.46 kg/m2  SpO2 97%  General Appearance:    Alert, oriented, no distress, appears stated age  Head:    Normocephalic, atraumatic  Eyes:    PERRL, EOMI, sclera non-icteric        Nose:   Nares without drainage or epistaxis. Mucosa, turbinates normal  Throat:   Moist mucous membranes.  Oropharynx without erythema or exudate.  Neck:   Supple. No carotid bruits.  No thyromegaly.  No lymphadenopathy.   Back:     No CVA tenderness, no spinal tenderness  Lungs:     Clear to auscultation bilaterally, without wheezes, rhonchi or rales  Chest wall:    No tenderness to palpitation  Heart:    Regular rate and rhythm without murmurs, gallops, rubs  Abdomen:     Soft, non-tender, nondistended, normal bowel sounds, no organomegaly  Genitalia:    deferred  Rectal:    deferred  Extremities:   No clubbing, cyanosis or edema.  Pulses:   2+ and symmetric all extremities  Skin:   Skin color, texture, turgor normal, no rashes or lesions  Lymph nodes:   Cervical, supraclavicular, and axillary nodes normal  Neurologic:   CNII-XII intact. Normal strength, sensation and reflexes      throughout    Labs on Admission:  Basic Metabolic Panel:  Recent Labs Lab 11/07/14 2029 11/08/14 2341 11/09/14 0759 11/10/14 0117  NA 138 139 138 134*  K 4.2 4.0 4.2 4.2  CL 108 108 109 108  CO2 21*  --  19* 16*  GLUCOSE 94 98 100* 102*  BUN 25* 36* 36* 31*  CREATININE 1.14 1.50* 1.49* 1.37*  CALCIUM 8.9  --  8.9 8.9   Liver Function Tests:  Recent Labs Lab 11/07/14 2029  AST 44*  ALT 20  ALKPHOS 88  BILITOT 0.5  PROT 7.3  ALBUMIN 3.2*    Recent Labs Lab 11/07/14 2029  LIPASE 42   No results for input(s): AMMONIA in the last 168 hours. CBC:  Recent Labs Lab 11/07/14 2029 11/08/14 2341 11/09/14 0759 11/10/14 0117  WBC 5.2  --  4.9 5.9  NEUTROABS  --   --  3.7  --   HGB 8.4* 9.9*  9.1* 8.5*  HCT 27.4* 29.0* 29.4* 27.0*  MCV 85.6  --  87.5 85.4  PLT 331  --  327 345   Cardiac Enzymes: No results for input(s): CKTOTAL, CKMB, CKMBINDEX, TROPONINI in the last 168 hours.  BNP (last 3 results)  Recent Labs  01/01/14 1107 02/07/14 0123 03/01/14 2250  PROBNP 3225.0* 2648.0* 7421.0*   CBG: No results for input(s): GLUCAP in the last 168 hours.  Radiological Exams on  Admission: Dg Chest 2 View  11/10/2014   CLINICAL DATA:  Chest pain, shortness of breath, abdominal pain.  EXAM: CHEST  2 VIEW  COMPARISON:  11/09/2014  FINDINGS: Cardiac enlargement with mild vascular congestion. No edema or consolidation. No blunting of costophrenic angles. No pneumothorax. Postoperative changes in the mediastinum. Degenerative changes in the spine and shoulders.  IMPRESSION: Cardiac enlargement with pulmonary vascular congestion.  No edema.   Electronically Signed   By: Lucienne Capers M.D.   On: 11/10/2014 01:46   Dg Chest 2 View  11/09/2014   CLINICAL DATA:  Left-sided chest pain, productive cough. History of hypertension and coronary artery disease.  EXAM: CHEST  2 VIEW  COMPARISON:  11/08/2014  FINDINGS: Moderate enlargement of the cardiac silhouette noted with central vascular congestion. Mild prominence of the interstitial markings is identified bilateral without focal consolidation. No pleural effusion. No acute osseous abnormality.  IMPRESSION: Moderate cardiomegaly with central vascular congestion and borderline interstitial edema.   Electronically Signed   By: Conchita Paris M.D.   On: 11/09/2014 07:35    EKG: Independently reviewed.  Assessment/Plan Principal Problem:   Metabolic acidosis, normal anion gap (NAG) Active Problems:   Chronic kidney disease (CKD), stage III (moderate)   Homelessness   Seizure disorder   Acute on chronic combined systolic and diastolic heart failure   History of GI bleed   Cocaine abuse   1. NAG metabolic acidosis - this is a new problem for the patient and has unclear cause, possibly related to his AKI that he had earlier this month (10 days ago) during admission, although his kidney function appears to have recovered at least somewhat from then (creatinine now down to 1.3 from 1.75 at time of discharge that admit). 1. Daily BMP to trend, have repeat ordered for noon today 2. Will hold off on ordering bicarb for now as patient  just got lasix in ED and is about to get put on further diuretics (see below).  Diuretics will tend towards causing contraction alkalosis. 3. Order bicarb if anion gap dosent improve 2. Acute on chronic combined systolic and diastolic CHF - 1. Got lasix 40 in ED 2. Will resume home meds including diuretics 3. CKD stage 3 - creatinine appears to be trending down over the past 3 days after bumping up between the 20th and 21st.  Suspect this may be related to the ketorlac dose he received early AM on the 21st (patient does have h/o NSAID induced kidney injury). 4. H/O GIB due to AVMs - 1. SCDs only for DVT ppx 2. Follow CBCs 3. HGB today is 8.5 5. Cocaine abuse - patient is again advised that cocaine is killing his heart! 6. H/o seizures - continue current meds 7. Homelessness - current "home" situation involves several other people who are also doing cocaine, patient states that this is probably his trigger and not conducive to quitting, have SW consult placed.    Code Status: Full  Family Communication: No family in room Disposition Plan: Admit to obs  Time  spent: 70 min  Olumide Dolinger M. [facility] Pager [phone]  If 7AM-7PM, please contact the day team taking care of the patient [email or web address] Password [facility] 11/10/2014, 5:43 AM

## 2014-11-10 NOTE — ED Notes (Signed)
Pt c/o cp sob and pain from neck to lower back since 8am.

## 2014-11-10 NOTE — ED Notes (Signed)
MD at bedside. 

## 2014-11-10 NOTE — Progress Notes (Signed)
Patient c/o "cramps between ribs and in fingers, I need some medicine", text paged MD. Awaiting response.

## 2014-11-10 NOTE — ED Notes (Signed)
Returned from xray

## 2014-11-10 NOTE — ED Notes (Signed)
Breakfast tray ordered 

## 2014-11-11 DIAGNOSIS — D638 Anemia in other chronic diseases classified elsewhere: Secondary | ICD-10-CM | POA: Diagnosis not present

## 2014-11-11 DIAGNOSIS — N183 Chronic kidney disease, stage 3 (moderate): Secondary | ICD-10-CM

## 2014-11-11 DIAGNOSIS — R7989 Other specified abnormal findings of blood chemistry: Secondary | ICD-10-CM | POA: Diagnosis not present

## 2014-11-11 DIAGNOSIS — E872 Acidosis: Secondary | ICD-10-CM | POA: Diagnosis not present

## 2014-11-11 DIAGNOSIS — F1410 Cocaine abuse, uncomplicated: Secondary | ICD-10-CM

## 2014-11-11 LAB — TROPONIN I: Troponin I: 0.06 ng/mL — ABNORMAL HIGH (ref <0.031)

## 2014-11-11 NOTE — Care Management Note (Signed)
Case Management Note  Patient Details  Name: Charles [name] MRN: [id number] Date of Birth: December 16, 1945  Subjective/Objective:                 Spoke with patient, he states that he has an appointment scheduled with Dr Alyson Ingles this Friday. He does not drive however he states that he uses a cab to get o his MD appointments. He reports that he lives with his daughter. He denies needing or using a cane, walker, etc. Per nursing report in progression, patient has an ankle bracelet on, and was positive for cocaine this admission. [place] aware. PT consult requested by nurse.   Action/Plan:  Will continue to follow and offer resources, [facility] as needed.  Expected Discharge Date:                  Expected Discharge Plan:  [place]  In-House Referral:     Discharge planning Services  CM Consult  Post Acute Care Choice:    Choice offered to:     DME Arranged:    DME Agency:     HH Arranged:    [place] Agency:     Status of Service:  In process, will continue to follow  Medicare Important Message Given:    Date Medicare IM Given:    Medicare IM give by:    Date Additional Medicare IM Given:    Additional Medicare Important Message give by:     If discussed at [place] of Stay Meetings, dates discussed:    Additional Comments:  [name], RN 11/11/2014, 10:57 AM

## 2014-11-11 NOTE — Progress Notes (Signed)
Patient had 2 runs of V tach 3 run and 6 beat run Dr. Charlies Silvers notified. Will continue to monitor.

## 2014-11-11 NOTE — Progress Notes (Addendum)
Patient ID: Charles [name], male   DOB: 1945/08/30, 69 y.o.   MRN: [id number] [facility] PROGRESS NOTE  Charles [name] JJK:[id number] DOB: 10-03-1945 DOA: 11/10/2014 PCP: No PCP Per Patient - will have case manager schedule appointment with [facility] wellness [facility]  Brief narrative:    69 y.o. male well known to our service from prior admits.Patient has past medical history of coronary artery disease, status post CABG, history of noncompliance, cocaine addiction. He presented to [facility] with reports of chest pain and shortness of breath for past couple of days prior to this admission. He was admitted for management of chest pain.   Assessment/Plan:    Active problems: Chest pain, cocaine induced / Elevated troponin level  - Likely vasospasm from cocaine and demand ischemia from cocaine and possibly chronic kidney disease - Urine drug screen positive for cocaine - Troponin is mildly elevated at 0.06, stable - Chest pain improving   Chronic combined systolic and diastolic CHF - Respiratory status stable. CHF seems compensated  - Continue torsemide 40 mg daily   Essential hypertension - Continue Norvasc, Hydralazine, imdur  Hypokalemia - From torsemide - Potassium supplemented  CKD stage 3 - Stable. Continue to monitor renal function while patient on torsemide  Anemia of chronic disease - Secondary to history of chronic kidney disease - Hemoglobin stable  H/O GIB due to AVMs  - No reports of bleeding  - Using SCDs for DVT prophylaxis   Cocaine abuse  - Counseling provided.   H/o seizures  - Continue with Depakote.    DVT Prophylaxis  - SCDs bilaterally   Code Status: Full.  Family Communication:  plan of care discussed with the patient Disposition Plan: Home when stable.   IV access:  Peripheral IV  Procedures and diagnostic studies:    Dg Chest 2 View 11/10/2014  Cardiac enlargement with pulmonary vascular congestion.  No edema.    Electronically Signed   By: Lucienne Capers M.D.   On: 11/10/2014 01:46   Dg Chest 2 View 11/09/2014  Moderate cardiomegaly with central vascular congestion and borderline interstitial edema.   Electronically Signed   By: Conchita Paris M.D.   On: 11/09/2014 07:35   Dg Chest 2 View 11/08/2014  Stable cardiomegaly and pulmonary vascular congestion.   Electronically Signed   By: Elon Alas M.D.   On: 11/08/2014 01:12   Dg Abd 1 View 11/10/2014  No acute abnormality.   Electronically Signed   By: Inge Rise M.D.   On: 11/10/2014 15:06    Medical Consultants:  None   Other Consultants:  None   IAnti-Infectives:   None    Leisa Lenz, MD  [facility] Pager [phone]  Time spent in minutes: 15 minutes  If 7PM-7AM, please contact night-coverage [email or web address] Password [facility] 11/11/2014, 1:40 PM      HPI/Subjective: No acute overnight events. Patient reports still intermittent chest pain but about 2/10 in intensity.   Objective: Filed Vitals:   11/11/14 8938 11/11/14 0645 11/11/14 0857 11/11/14 0910  BP: 138/72  138/77   Pulse: 85  88   Temp: 98.3 F (36.8 C)  98.6 F (37 C)   TempSrc: Oral  Oral   Resp: 20  18   Height:      Weight:  73.347 kg (161 lb 11.2 oz)    SpO2: 97%  96% 97%    Intake/Output Summary (Last 24 hours) at 11/11/14 1340 Last data filed at 11/11/14 0900  Gross per 24  hour  Intake    360 ml  Output   1250 ml  Net   -890 ml    Exam:   General:  Pt is alert, follows commands appropriately, not in acute distress  Cardiovascular: Regular rate and rhythm, S1/S2, no murmurs  Respiratory: Clear to auscultation bilaterally, no wheezing, no crackles, no rhonchi  Abdomen: Soft, non tender, non distended, bowel sounds present  Extremities: No edema, pulses DP and PT palpable bilaterally  Neuro: Grossly nonfocal  Data Reviewed: Basic Metabolic Panel:  Recent Labs Lab 11/07/14 2029 11/08/14 2341 11/09/14 0759 11/10/14 0117  11/10/14 1515 11/10/14 2304  NA 138 139 138 134* 135 135  K 4.2 4.0 4.2 4.2 4.0 3.6  CL 108 108 109 108 106 103  CO2 21*  --  19* 16* 22 19*  GLUCOSE 94 98 100* 102* 100* 100*  BUN 25* 36* 36* 31* 29* 28*  CREATININE 1.14 1.50* 1.49* 1.37* 1.38* 1.68*  CALCIUM 8.9  --  8.9 8.9 8.5* 8.4*  MG  --   --   --   --   --  1.8   Liver Function Tests:  Recent Labs Lab 11/07/14 2029  AST 44*  ALT 20  ALKPHOS 88  BILITOT 0.5  PROT 7.3  ALBUMIN 3.2*    Recent Labs Lab 11/07/14 2029  LIPASE 42   No results for input(s): AMMONIA in the last 168 hours. CBC:  Recent Labs Lab 11/07/14 2029 11/08/14 2341 11/09/14 0759 11/10/14 0117  WBC 5.2  --  4.9 5.9  NEUTROABS  --   --  3.7  --   HGB 8.4* 9.9* 9.1* 8.5*  HCT 27.4* 29.0* 29.4* 27.0*  MCV 85.6  --  87.5 85.4  PLT 331  --  327 345   Cardiac Enzymes:  Recent Labs Lab 11/10/14 1515 11/10/14 2039 11/11/14 0123  TROPONINI 0.06* 0.06* 0.06*   BNP: Invalid input(s): POCBNP CBG: No results for input(s): GLUCAP in the last 168 hours.  Recent Results (from the past 240 hour(s))  MRSA PCR Screening     Status: Abnormal   Collection Time: 11/10/14  5:25 PM  Result Value Ref Range Status   MRSA by PCR POSITIVE (A) NEGATIVE Final    Comment:        The GeneXpert MRSA Assay (FDA approved for NASAL specimens only), is one component of a comprehensive MRSA colonization surveillance program. It is not intended to diagnose MRSA infection nor to guide or monitor treatment for MRSA infections. RESULT CALLED TO, READ BACK BY AND VERIFIED WITH: Serafina Mitchell RN [phone] 11/10/14 A BROWNING      Scheduled Meds: . amLODipine  10 mg Oral Daily  . aspirin EC  81 mg Oral Daily  . atorvastatin  80 mg Oral q1800  . budesonide-formoterol  2 puff Inhalation BID  . citalopram  20 mg Oral Daily  . divalproex  500 mg Oral BID  . ferrous sulfate  325 mg Oral BID WC  . hydrALAZINE  100 mg Oral 3 times per day  . isosorbide mononitrate   60 mg Oral Daily  . pantoprazole (PROTONIX) IV  40 mg Intravenous Q12H  . phenytoin  200 mg Oral BID  . potassium chloride SA  20 mEq Oral Daily  . sodium bicarbonate  1,300 mg Oral TID  . sodium chloride  3 mL Intravenous Q12H  . torsemide  40 mg Oral Daily   Continuous Infusions:

## 2014-11-11 NOTE — Evaluation (Signed)
Physical Therapy Evaluation Patient Details Name: Charles [name] MRN: [id number] DOB: 1945-07-02 Today's Date: 11/11/2014   History of Present Illness  pt is a 69 y/o male admitted with CP and SOB.  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  Also shows signs of CHF.  PMHx:  CABG  Clinical Impression  Pt admitted with/for chf and SOB.  Pt currently limited functionally due to the problems listed below.  (see problems list.)  Pt will benefit from PT to maximize function and safety to be able to get home safely with available assist.     Follow Up Recommendations No PT follow up    Equipment Recommendations  None recommended by PT    Recommendations for Other Services       Precautions / Restrictions Precautions Precautions: Fall      Mobility  Bed Mobility               General bed mobility comments: sitting EOB  Transfers Overall transfer level: Needs assistance   Transfers: Sit to/from Stand Sit to Stand: Modified independent (Device/Increase time)            Ambulation/Gait Ambulation/Gait assistance: Supervision Ambulation Distance (Feet): 360 Feet Assistive device: None Gait Pattern/deviations: Step-through pattern Gait velocity: slower Gait velocity interpretation: Below normal speed for age/gender General Gait Details: 2 standing rests including holding to rail.  Generally steady, but guarded.  Stairs            Wheelchair Mobility    Modified Rankin (Stroke Patients Only)       Balance Overall balance assessment: Needs assistance Sitting-balance support: No upper extremity supported Sitting balance-Leahy Scale: Good       Standing balance-Leahy Scale: Fair                               Pertinent Vitals/Pain Pain Assessment: Faces Faces Pain Scale: No hurt    Home Living Family/patient expects to be discharged to:: Private residence Living Arrangements: Other (Comment) (has a daughter that shops for  groceries,but "doesn't" count ) Available Help at Discharge: Other (Comment);Available PRN/intermittently Type of Home: House Home Access: Stairs to enter Entrance Stairs-Rails: Psychiatric nurse of Steps: 4 Home Layout: One level Home Equipment: None      Prior Function Level of Independence: Independent         Comments: No longer drives, 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, newspaper   Dominant Hand: Right    Extremity/Trunk Assessment   Upper Extremity Assessment: Defer to OT evaluation           Lower Extremity Assessment: Generalized weakness;Overall WFL for tasks assessed         Communication   Communication: No difficulties  Cognition Arousal/Alertness: Awake/alert Behavior During Therapy: WFL for tasks assessed/performed Overall Cognitive Status: Within Functional Limits for tasks assessed                      General Comments      Exercises        Assessment/Plan    PT Assessment Patient needs continued PT services  PT Diagnosis Generalized weakness;Other (comment) (decreased activitu tolerance)   PT Problem List Decreased strength;Decreased activity tolerance;Decreased mobility;Cardiopulmonary status limiting activity  PT Treatment Interventions Gait training;Functional mobility training;Therapeutic activities;Stair training;Patient/family education   PT Goals (Current goals can be found in the Care Plan section) Acute Rehab PT Goals Patient Stated Goal: feel  better PT Goal Formulation: With patient Time For Goal Achievement: 11/18/14 Potential to Achieve Goals: Fair    Frequency Min 3X/week   Barriers to discharge        Co-evaluation               End of Session   Activity Tolerance: Patient tolerated treatment well Patient left: in bed;with call bell/phone within reach Nurse Communication: Mobility status    Functional Assessment Tool Used: clinical judgement Functional Limitation: Mobility: Walking and  moving around Mobility: Walking and Moving Around Current Status (X6553): At least 1 percent but less than 20 percent impaired, limited or restricted Mobility: Walking and Moving Around Goal Status [phone]): At least 1 percent but less than 20 percent impaired, limited or restricted    Time: 0786-7544 PT Time Calculation (min) (ACUTE ONLY): 31 min   Charges:   PT Evaluation $Initial PT Evaluation Tier I: 1 Procedure PT Treatments $Gait Training: 8-22 mins   PT G Codes:   PT G-Codes **NOT FOR INPATIENT CLASS** Functional Assessment Tool Used: clinical judgement Functional Limitation: Mobility: Walking and moving around Mobility: Walking and Moving Around Current Status (B2010): At least 1 percent but less than 20 percent impaired, limited or restricted Mobility: Walking and Moving Around Goal Status [phone]): At least 1 percent but less than 20 percent impaired, limited or restricted    Felesia Stahlecker, Tessie Fass 11/11/2014, 5:38 PM 11/11/2014  Donnella Sham, PT [phone]  (pager)
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
## 2014-11-12 DIAGNOSIS — R0789 Other chest pain: Secondary | ICD-10-CM

## 2014-11-12 DIAGNOSIS — F1410 Cocaine abuse, uncomplicated: Secondary | ICD-10-CM | POA: Diagnosis not present

## 2014-11-12 DIAGNOSIS — E872 Acidosis: Secondary | ICD-10-CM | POA: Diagnosis not present

## 2014-11-12 DIAGNOSIS — N183 Chronic kidney disease, stage 3 (moderate): Secondary | ICD-10-CM | POA: Diagnosis not present

## 2014-11-12 DIAGNOSIS — G40909 Epilepsy, unspecified, not intractable, without status epilepticus: Secondary | ICD-10-CM | POA: Diagnosis not present

## 2014-11-12 LAB — BASIC METABOLIC PANEL
ANION GAP: 9 (ref 5–15)
BUN: 23 mg/dL — ABNORMAL HIGH (ref 6–20)
CALCIUM: 8.5 mg/dL — AB (ref 8.9–10.3)
CO2: 27 mmol/L (ref 22–32)
CREATININE: 1.3 mg/dL — AB (ref 0.61–1.24)
Chloride: 100 mmol/L — ABNORMAL LOW (ref 101–111)
GFR, EST NON AFRICAN AMERICAN: 54 mL/min — AB (ref >60)
GLUCOSE: 83 mg/dL (ref 65–99)
Potassium: 4.1 mmol/L (ref 3.5–5.1)
Sodium: 136 mmol/L (ref 135–145)

## 2014-11-12 MED ORDER — ACETAMINOPHEN 325 MG PO TABS
650.0000 mg | ORAL_TABLET | ORAL | Status: AC | PRN
Start: 2014-11-12 — End: ?

## 2014-11-12 NOTE — Discharge Summary (Signed)
Physician Discharge Summary  Alick Lecomte OTR:[id number] DOB: [birth year] DOA: 11/10/2014  PCP: No PCP Per Patient - information provided to patient about community [facility] wellness clinic  Admit date: 11/10/2014 Discharge date: 11/12/2014  Recommendations for Outpatient Follow-up:  1. Patient instructed to call community health and wellness clinic and schedule follow-up appointment with them in about 1-2 weeks after discharge  Discharge Diagnoses:  Principal Problem:   Metabolic acidosis, normal anion gap (NAG) Active Problems:   Chronic kidney disease (CKD), stage III (moderate)   Homelessness   Seizure disorder   Acute on chronic combined systolic and diastolic heart failure   History of GI bleed   Cocaine abuse    Discharge Condition: stable   Diet recommendation: as tolerated   History of present illness:  69 y.o. male well known to our service from prior admits.Patient has past medical history of coronary artery disease, status post CABG, history of noncompliance, cocaine addiction. He presented to [facility] with reports of chest pain and shortness of breath for past couple of days prior to this admission. He was admitted for management of chest pain.   Hospital Course:    Assessment/Plan:    Principal problem: Chest pain, cocaine induced / Elevated troponin level  - Likely vasospasm and demand ischemia from cocaine and chronic kidney disease - Urine drug screen positive for cocaine - Troponin is mildly elevated at 0.06, stable - Chest pain resolved  Active problems: Chronic combined systolic and diastolic CHF - Continue torsemide 40 mg daily  - CHF compensated  Essential hypertension - Continue Norvasc, Hydralazine, Imdur asper prior home regimen   Hypokalemia - Secondary to torsemide - Potassium supplemented  CKD stage 3 - Stable while on torsemide - Creatinine trended down in past 24 hours from 1.68 - 1.3  Anemia of chronic disease -  Secondary to history of chronic kidney disease - Hemoglobin stable at 8.5 - No current indications for transfusion  H/O GIB due to AVMs  - No reports of bleeding  - Stable  Cocaine abuse  - Counseling provided.  - UDS positive for cocaine  Diarrhea - Apparently had diarrhea but no reports of diarrhea per RN  H/o seizures  - Continue with Depakote. - No reports of seizures during this hospital stay    DVT Prophylaxis  - SCDs bilaterally   Code Status: Full.  Family Communication: plan of care discussed with the patient   IV access:  Peripheral IV  Procedures and diagnostic studies:   Dg Chest 2 View 11/10/2014 Cardiac enlargement with pulmonary vascular congestion. No edema.   Dg Chest 2 View 11/09/2014 Moderate cardiomegaly with central vascular congestion and borderline interstitial edema.   Dg Chest 2 View 11/08/2014 Stable cardiomegaly and pulmonary vascular congestion.  Dg Abd 1 View 11/10/2014 No acute abnormality.    Medical Consultants:  None   Other Consultants:  None   IAnti-Infectives:   None     Signed:  Leisa Lenz, MD  [facility] 11/12/2014, 9:53 AM  Pager #: [phone]  Time spent in minutes: less than 30 minutes    Discharge Exam: Filed Vitals:   11/12/14 0532  BP: 159/87  Pulse: 86  Temp: 98.4 F (36.9 C)  Resp: 20   Filed Vitals:   11/11/14 1943 11/11/14 2105 11/12/14 0532 11/12/14 0741  BP:  118/60 159/87   Pulse:  80 86   Temp:  98.6 F (37 C) 98.4 F (36.9 C)   TempSrc:  Oral Oral  Resp:  18 20   Height:      Weight:   74.707 kg (164 lb 11.2 oz)   SpO2: 98% 96% 99% 98%    General: Pt is alert, follows commands appropriately, not in acute distress Cardiovascular: Regular rate and rhythm, S1/S2 + Respiratory: Clear to auscultation bilaterally, no wheezing, no crackles, no rhonchi Abdominal: Soft, non tender, non distended, bowel sounds +, no guarding Extremities: no  edema, no cyanosis, pulses palpable bilaterally DP and PT Neuro: Grossly nonfocal  Discharge Instructions  Discharge Instructions    Call MD for:  difficulty breathing, headache or visual disturbances    Complete by:  As directed      Call MD for:  persistant nausea and vomiting    Complete by:  As directed      Call MD for:  severe uncontrolled pain    Complete by:  As directed      Diet - low sodium heart healthy    Complete by:  As directed      Increase activity slowly    Complete by:  As directed             Medication List    TAKE these medications        acetaminophen 325 MG tablet  Commonly known as:  TYLENOL  Take 2 tablets (650 mg total) by mouth every 4 (four) hours as needed for headache or mild pain.     albuterol 108 (90 BASE) MCG/ACT inhaler  Commonly known as:  PROVENTIL HFA;VENTOLIN HFA  Inhale 2 puffs into the lungs every 6 (six) hours as needed for wheezing or shortness of breath.     amLODipine 10 MG tablet  Commonly known as:  NORVASC  Take 1 tablet (10 mg total) by mouth daily.     aspirin EC 81 MG tablet  Take 1 tablet (81 mg total) by mouth daily. RESUME AFTER 1 WEEK.     atorvastatin 80 MG tablet  Commonly known as:  LIPITOR  Take 1 tablet (80 mg total) by mouth daily at 6 PM.     budesonide-formoterol 80-4.5 MCG/ACT inhaler  Commonly known as:  SYMBICORT  Inhale 2 puffs into the lungs 2 (two) times daily.     citalopram 20 MG tablet  Commonly known as:  CELEXA  Take 20 mg by mouth daily.     diclofenac sodium 1 % Gel  Commonly known as:  VOLTAREN  Apply 4 g topically 4 (four) times daily.     divalproex 500 MG 24 hr tablet  Commonly known as:  DEPAKOTE ER  Take 500 mg by mouth 2 (two) times daily.     ferrous sulfate 325 (65 FE) MG tablet  Take 325 mg by mouth 2 (two) times daily with a meal.     hydrALAZINE 100 MG tablet  Commonly known as:  APRESOLINE  Take 100 mg by mouth 3 (three) times daily.      HYDROcodone-acetaminophen 5-325 MG per tablet  Commonly known as:  NORCO/VICODIN  Take 1-2 tablets by mouth every 6 (six) hours as needed for moderate pain.     isosorbide mononitrate 30 MG 24 hr tablet  Commonly known as:  IMDUR  Take 2 tablets (60 mg total) by mouth daily.     KLOR-CON M20 20 MEQ tablet  Generic drug:  potassium chloride SA  Take 20 mEq by mouth daily.     metolazone 5 MG tablet  Commonly known as:  ZAROXOLYN  Take 1  tablet (5 mg total) by mouth as needed (for weight gain of 2 lbs in 2-3 days).     nitroGLYCERIN 0.4 MG SL tablet  Commonly known as:  NITROSTAT  Place 1 tablet (0.4 mg total) under the tongue every 5 (five) minutes as needed for chest pain (upto max 3 doses at one time.).     pantoprazole 40 MG tablet  Commonly known as:  PROTONIX  Take 40 mg by mouth daily.     phenytoin 200 MG ER capsule  Commonly known as:  DILANTIN  Take 1 capsule (200 mg total) by mouth 2 (two) times daily.     torsemide 20 MG tablet  Commonly known as:  DEMADEX  Take 2 tablets (40 mg total) by mouth daily.           Follow-up Information    Follow up with [facility]    . Schedule an appointment as soon as possible for a visit in 2 weeks.   Why:  Follow up appt after recent hospitalization   Contact information:   [address] [place] [address] [phone]       The results of significant diagnostics from this hospitalization (including imaging, microbiology, ancillary and laboratory) are listed below for reference.    Significant Diagnostic Studies: Dg Chest 2 View  11/10/2014   CLINICAL DATA:  Chest pain, shortness of breath, abdominal pain.  EXAM: CHEST  2 VIEW  COMPARISON:  11/09/2014  FINDINGS: Cardiac enlargement with mild vascular congestion. No edema or consolidation. No blunting of costophrenic angles. No pneumothorax. Postoperative changes in the mediastinum. Degenerative changes in the spine and  shoulders.  IMPRESSION: Cardiac enlargement with pulmonary vascular congestion.  No edema.   Electronically Signed   By: Lucienne Capers M.D.   On: 11/10/2014 01:46   Dg Chest 2 View  11/09/2014   CLINICAL DATA:  Left-sided chest pain, productive cough. History of hypertension and coronary artery disease.  EXAM: CHEST  2 VIEW  COMPARISON:  11/08/2014  FINDINGS: Moderate enlargement of the cardiac silhouette noted with central vascular congestion. Mild prominence of the interstitial markings is identified bilateral without focal consolidation. No pleural effusion. No acute osseous abnormality.  IMPRESSION: Moderate cardiomegaly with central vascular congestion and borderline interstitial edema.   Electronically Signed   By: Conchita Paris M.D.   On: 11/09/2014 07:35   Dg Chest 2 View  11/08/2014   CLINICAL DATA:  Chest pain and shortness of breath today. History of CHF, hypertension, coronary artery disease.  EXAM: CHEST  2 VIEW  COMPARISON:  Chest radiograph October 29, 2014  FINDINGS: Cardiac silhouette is moderate to severely enlarged, stable. Mild pulmonary vascular congestion is similar to prior examination without pleural effusion or focal consolidation. No pneumothorax. Status post median sternotomy for CABG with fractured most caudal wire. Osteopenia. Soft tissue planes are nonsuspicious.  IMPRESSION: Stable cardiomegaly and pulmonary vascular congestion.   Electronically Signed   By: Elon Alas M.D.   On: 11/08/2014 01:12   Dg Chest 2 View  10/29/2014   CLINICAL DATA:  One day history of left-sided chest pain  EXAM: CHEST  2 VIEW  COMPARISON:  October 27, 2014  FINDINGS: There is cardiomegaly with slight pulmonary venous hypertension. There is slight interstitial edema in the mid lower lung zones bilaterally. No airspace consolidation. No adenopathy. Patient is status post coronary artery bypass grafting.  IMPRESSION: Cardiomegaly with slight edema. Evidence of a degree of congestive heart  failure. No airspace consolidation. Very little change from 2 days prior.   Electronically Signed   By: Lowella Grip III M.D.   On: 10/29/2014 02:01   Dg Chest 2 View  10/15/2014   CLINICAL DATA:  Chest pain for 2 days.  EXAM: CHEST  2 VIEW  COMPARISON:  Chest x-rays dated 10/05/2014 and 08/10/2014. Comparison is also made to a CT angiogram of the neck and chest dated 10/06/2014.  FINDINGS: Cardiomegaly is unchanged. Again noted is central pulmonary vascular congestion and mild bilateral interstitial prominence most suggestive of interstitial edema. No confluent airspace opacity to suggest a developing pneumonia. No pleural effusion seen. No pneumothorax. Median sternotomy wires appear intact and stable in position. No acute osseous abnormality identified.  IMPRESSION: Cardiomegaly with central pulmonary vascular congestion and mild bilateral interstitial edema suggesting mild volume overload/congestive heart failure. This may be a chronic volume overload/congestive heart failure as the appearance is stable compared to multiple prior exams.   Electronically Signed   By: Franki Cabot M.D.   On: 10/15/2014 15:21   Dg Abd 1 View  11/10/2014   CLINICAL DATA:  Abdominal pain today.  EXAM: ABDOMEN - 1 VIEW  COMPARISON:  CT abdomen and pelvis 10/07/2014. Single view of the abdomen 09/15/2014.  FINDINGS: The bowel gas pattern is normal. No radio-opaque calculi or other significant radiographic abnormality are seen. Cardiomegaly is noted. Convex left lumbar scoliosis is seen.  IMPRESSION: No acute abnormality.   Electronically Signed   By: Inge Rise M.D.   On: 11/10/2014 15:06   Ct Angio Chest Aorta W/cm &/or Wo/cm  10/15/2014   CLINICAL DATA:  Chest pain.  Shortness of breath.  Low hemoglobin.  EXAM: CT ANGIOGRAPHY CHEST, ABDOMEN AND PELVIS  TECHNIQUE: Multidetector CT imaging through the chest, abdomen and pelvis was performed using the standard protocol during bolus administration of intravenous  contrast. Multiplanar reconstructed images and MIPs were obtained and reviewed to evaluate the vascular anatomy.  CONTRAST:  15mL OMNIPAQUE IOHEXOL 350 MG/ML SOLN  COMPARISON:  None.  Chest x-ray dated 10/15/2014 and CT angiogram of the chest abdomen pelvis dated 10/06/2014  FINDINGS: CTA CHEST FINDINGS  There are no pulmonary emboli and there is no aortic dissection. There are scattered calcifications in the thoracic aorta. Extensive coronary artery calcification. Prior CABG. Cardiomegaly, unchanged since the prior exam. The patient has chronic interstitial lung disease with slight interstitial edema at the lung bases, improved since the prior CT scan. Tiny left pleural effusion.  Review of the MIP images confirms the above findings.  CTA ABDOMEN AND PELVIS FINDINGS  Extensive atherosclerosis of the arteries of the abdomen and pelvis. Severe bilateral proximal common iliac artery stenoses. No dissection or aneurysm. Single widely patent renal arteries bilaterally.  Liver, biliary tree, spleen, pancreas, adrenal glands and kidneys are normal except for an exophytic 12 mm hyperdense cyst on the lateral aspect of the mid left kidney, unchanged in size in densities since 03/08/2013.  The bowel is normal. Bladder is normal. No acute osseous abnormalities.  Review of the MIP images confirms the above findings.  IMPRESSION: 1. No acute abnormalities of the chest or abdomen. Specifically, no evidence of aortic dissection or pulmonary emboli. 2. Improved slight interstitial pulmonary edema at the lung bases superimposed on chronic interstitial lung disease. 3. Extensive aortic atherosclerosis with severe stenoses of the common iliac arteries bilaterally.   Electronically Signed   By: Lorriane Shire M.D.   On: 10/15/2014 17:29   Dg Hip Unilat With Pelvis  2-3 Views Left  10/16/2014   CLINICAL DATA:  Left hip pain.  EXAM: DG HIP (WITH OR WITHOUT PELVIS) 2-3V LEFT  COMPARISON:  None.  FINDINGS: Mild osteoarthritis of both  hips seen in the frontal projection. Proliferative changes are seen adjacent to the greater trochanter. No evidence of acute fracture or dislocation. No bony lesions or destruction identified. Vascular calcifications are seen in the iliac and femoral arteries. The bony pelvis is intact and has a normal appearance. Visible degenerative disc disease in the lower lumbar spine. Soft tissues are unremarkable.  IMPRESSION: No fracture or dislocation.  Mild osteoarthritis of the left hip.   Electronically Signed   By: Aletta Edouard M.D.   On: 10/16/2014 16:31   Ct Cta Abd/pel W/cm &/or W/o Cm  10/15/2014   CLINICAL DATA:  Chest pain.  Shortness of breath.  Low hemoglobin.  EXAM: CT ANGIOGRAPHY CHEST, ABDOMEN AND PELVIS  TECHNIQUE: Multidetector CT imaging through the chest, abdomen and pelvis was performed using the standard protocol during bolus administration of intravenous contrast. Multiplanar reconstructed images and MIPs were obtained and reviewed to evaluate the vascular anatomy.  CONTRAST:  138mL OMNIPAQUE IOHEXOL 350 MG/ML SOLN  COMPARISON:  None.  Chest x-ray dated 10/15/2014 and CT angiogram of the chest abdomen pelvis dated 10/06/2014  FINDINGS: CTA CHEST FINDINGS  There are no pulmonary emboli and there is no aortic dissection. There are scattered calcifications in the thoracic aorta. Extensive coronary artery calcification. Prior CABG. Cardiomegaly, unchanged since the prior exam. The patient has chronic interstitial lung disease with slight interstitial edema at the lung bases, improved since the prior CT scan. Tiny left pleural effusion.  Review of the MIP images confirms the above findings.  CTA ABDOMEN AND PELVIS FINDINGS  Extensive atherosclerosis of the arteries of the abdomen and pelvis. Severe bilateral proximal common iliac artery stenoses. No dissection or aneurysm. Single widely patent renal arteries bilaterally.  Liver, biliary tree, spleen, pancreas, adrenal glands and kidneys are normal  except for an exophytic 12 mm hyperdense cyst on the lateral aspect of the mid left kidney, unchanged in size in densities since 03/08/2013.  The bowel is normal. Bladder is normal. No acute osseous abnormalities.  Review of the MIP images confirms the above findings.  IMPRESSION: 1. No acute abnormalities of the chest or abdomen. Specifically, no evidence of aortic dissection or pulmonary emboli. 2. Improved slight interstitial pulmonary edema at the lung bases superimposed on chronic interstitial lung disease. 3. Extensive aortic atherosclerosis with severe stenoses of the common iliac arteries bilaterally.   Electronically Signed   By: Lorriane Shire M.D.   On: 10/15/2014 17:29    Microbiology: Recent Results (from the past 240 hour(s))  MRSA PCR Screening     Status: Abnormal   Collection Time: 11/10/14  5:25 PM  Result Value Ref Range Status   MRSA by PCR POSITIVE (A) NEGATIVE Final    Comment:        The GeneXpert MRSA Assay (FDA approved for NASAL specimens only), is one component of a comprehensive MRSA colonization surveillance program. It is not intended to diagnose MRSA infection nor to guide or monitor treatment for MRSA infections. RESULT CALLED TO, READ BACK BY AND VERIFIED WITH: Serafina Mitchell RN 1610 11/10/14 A BROWNING      Labs: Basic Metabolic Panel:  Recent Labs Lab 11/09/14 0759 11/10/14 0117 11/10/14 1515 11/10/14 2304 11/12/14 0615  NA 138 134* 135 135 136  K 4.2 4.2 4.0 3.6 4.1  CL 109  108 106 103 100*  CO2 19* 16* 22 19* 27  GLUCOSE 100* 102* 100* 100* 83  BUN 36* 31* 29* 28* 23*  CREATININE 1.49* 1.37* 1.38* 1.68* 1.30*  CALCIUM 8.9 8.9 8.5* 8.4* 8.5*  MG  --   --   --  1.8  --    Liver Function Tests:  Recent Labs Lab 11/07/14 2029  AST 44*  ALT 20  ALKPHOS 88  BILITOT 0.5  PROT 7.3  ALBUMIN 3.2*    Recent Labs Lab 11/07/14 2029  LIPASE 42   No results for input(s): AMMONIA in the last 168 hours. CBC:  Recent Labs Lab  11/07/14 2029 11/08/14 2341 11/09/14 0759 11/10/14 0117  WBC 5.2  --  4.9 5.9  NEUTROABS  --   --  3.7  --   HGB 8.4* 9.9* 9.1* 8.5*  HCT 27.4* 29.0* 29.4* 27.0*  MCV 85.6  --  87.5 85.4  PLT 331  --  327 345   Cardiac Enzymes:  Recent Labs Lab 11/10/14 1515 11/10/14 2039 11/11/14 0123  TROPONINI 0.06* 0.06* 0.06*   BNP: BNP (last 3 results)  Recent Labs  10/05/14 2201 10/15/14 1632 11/10/14 0117  BNP 748.5* 2506.0* 3012.2*    ProBNP (last 3 results)  Recent Labs  01/01/14 1107 02/07/14 0123 03/01/14 2250  PROBNP 3225.0* 2648.0* 7421.0*    CBG: No results for input(s): GLUCAP in the last 168 hours.

## 2014-11-12 NOTE — Progress Notes (Signed)
Pt. Received discharge instructions and prescriptions. Educated pt. On follow-up appointments. Removed IV and upon removal of tele box, CCMD called to make this RN aware that pt. Had a 14 beat run of V-tach. Pt. Appeared to be asymptomatic. Made MD aware. No new skin issues noted. All questions answered. Pt. Daughter to pick up and transport back home. No further needs noted at this time.

## 2014-11-12 NOTE — Clinical Social Work Note (Addendum)
CSW has provided patient with affordable housing resources. Patient states, "Make sure you get me a voucher for a taxi." CSW has reiterated that patient will not receive a taxi voucher for transport home, he will need to make arrangements with his daughter or non-emergency ambulance transport will be arranged for him. He wants to speak with a "hospital administrator". CSW has made unit director aware. CSW inquired about the ankle bracelet the patient is wearing. He states that the police department does not need to be notified of his discharge as this is only for a curfew. Police department is aware he is in the hospital. [place] signing off at this time.   Liz Beach MSW, [place], [place], [phone]

## 2014-11-12 NOTE — Discharge Instructions (Addendum)

## 2014-11-15 ENCOUNTER — Emergency Department (HOSPITAL_COMMUNITY)
Admission: EM | Admit: 2014-11-15 | Discharge: 2014-11-16 | Disposition: A | Discharge status: Home / Self Care | Payer: Medicare Other | Attending: Emergency Medicine | Admitting: Emergency Medicine

## 2014-11-15 ENCOUNTER — Emergency Department (HOSPITAL_COMMUNITY): Payer: Medicare Other

## 2014-11-15 ENCOUNTER — Encounter (HOSPITAL_COMMUNITY): Payer: Self-pay | Admitting: Emergency Medicine

## 2014-11-15 DIAGNOSIS — G8929 Other chronic pain: Secondary | ICD-10-CM | POA: Diagnosis not present

## 2014-11-15 DIAGNOSIS — J449 Chronic obstructive pulmonary disease, unspecified: Secondary | ICD-10-CM | POA: Insufficient documentation

## 2014-11-15 DIAGNOSIS — I5042 Chronic combined systolic (congestive) and diastolic (congestive) heart failure: Secondary | ICD-10-CM | POA: Insufficient documentation

## 2014-11-15 DIAGNOSIS — Z8701 Personal history of pneumonia (recurrent): Secondary | ICD-10-CM | POA: Diagnosis not present

## 2014-11-15 DIAGNOSIS — R079 Chest pain, unspecified: Secondary | ICD-10-CM | POA: Diagnosis not present

## 2014-11-15 DIAGNOSIS — Z9861 Coronary angioplasty status: Secondary | ICD-10-CM | POA: Insufficient documentation

## 2014-11-15 DIAGNOSIS — I25119 Atherosclerotic heart disease of native coronary artery with unspecified angina pectoris: Secondary | ICD-10-CM | POA: Insufficient documentation

## 2014-11-15 DIAGNOSIS — G40909 Epilepsy, unspecified, not intractable, without status epilepticus: Secondary | ICD-10-CM | POA: Diagnosis not present

## 2014-11-15 DIAGNOSIS — F1110 Opioid abuse, uncomplicated: Secondary | ICD-10-CM | POA: Diagnosis not present

## 2014-11-15 DIAGNOSIS — Z87891 Personal history of nicotine dependence: Secondary | ICD-10-CM | POA: Diagnosis not present

## 2014-11-15 DIAGNOSIS — I129 Hypertensive chronic kidney disease with stage 1 through stage 4 chronic kidney disease, or unspecified chronic kidney disease: Secondary | ICD-10-CM | POA: Diagnosis not present

## 2014-11-15 DIAGNOSIS — N183 Chronic kidney disease, stage 3 (moderate): Secondary | ICD-10-CM | POA: Insufficient documentation

## 2014-11-15 DIAGNOSIS — Z79899 Other long term (current) drug therapy: Secondary | ICD-10-CM | POA: Diagnosis not present

## 2014-11-15 DIAGNOSIS — D649 Anemia, unspecified: Secondary | ICD-10-CM | POA: Insufficient documentation

## 2014-11-15 DIAGNOSIS — Z859 Personal history of malignant neoplasm, unspecified: Secondary | ICD-10-CM | POA: Insufficient documentation

## 2014-11-15 DIAGNOSIS — N400 Enlarged prostate without lower urinary tract symptoms: Secondary | ICD-10-CM | POA: Diagnosis not present

## 2014-11-15 DIAGNOSIS — F209 Schizophrenia, unspecified: Secondary | ICD-10-CM | POA: Diagnosis not present

## 2014-11-15 DIAGNOSIS — Z951 Presence of aortocoronary bypass graft: Secondary | ICD-10-CM | POA: Diagnosis not present

## 2014-11-15 DIAGNOSIS — M1990 Unspecified osteoarthritis, unspecified site: Secondary | ICD-10-CM | POA: Insufficient documentation

## 2014-11-15 DIAGNOSIS — R05 Cough: Secondary | ICD-10-CM

## 2014-11-15 DIAGNOSIS — F319 Bipolar disorder, unspecified: Secondary | ICD-10-CM | POA: Diagnosis not present

## 2014-11-15 DIAGNOSIS — I252 Old myocardial infarction: Secondary | ICD-10-CM | POA: Diagnosis not present

## 2014-11-15 DIAGNOSIS — Z7982 Long term (current) use of aspirin: Secondary | ICD-10-CM | POA: Diagnosis not present

## 2014-11-15 DIAGNOSIS — R059 Cough, unspecified: Secondary | ICD-10-CM

## 2014-11-15 LAB — BRAIN NATRIURETIC PEPTIDE: B Natriuretic Peptide: 1611.5 pg/mL — ABNORMAL HIGH (ref 0.0–100.0)

## 2014-11-15 LAB — BASIC METABOLIC PANEL
Anion gap: 10 (ref 5–15)
BUN: 24 mg/dL — AB (ref 6–20)
CALCIUM: 8.8 mg/dL — AB (ref 8.9–10.3)
CO2: 24 mmol/L (ref 22–32)
CREATININE: 1.23 mg/dL (ref 0.61–1.24)
Chloride: 104 mmol/L (ref 101–111)
GFR calc Af Amer: >60 mL/min (ref >60)
GFR, EST NON AFRICAN AMERICAN: 58 mL/min — AB (ref >60)
GLUCOSE: 98 mg/dL (ref 65–99)
Potassium: 3.8 mmol/L (ref 3.5–5.1)
Sodium: 138 mmol/L (ref 135–145)

## 2014-11-15 LAB — CBC
HCT: 26.4 % — ABNORMAL LOW (ref 39.0–52.0)
Hemoglobin: 8 g/dL — ABNORMAL LOW (ref 13.0–17.0)
MCH: 25.6 pg — ABNORMAL LOW (ref 26.0–34.0)
MCHC: 30.3 g/dL (ref 30.0–36.0)
MCV: 84.3 fL (ref 78.0–100.0)
Platelets: 304 10*3/uL (ref 150–400)
RBC: 3.13 MIL/uL — ABNORMAL LOW (ref 4.22–5.81)
RDW: 15.9 % — AB (ref 11.5–15.5)
WBC: 3.9 10*3/uL — ABNORMAL LOW (ref 4.0–10.5)

## 2014-11-15 LAB — I-STAT TROPONIN, ED
TROPONIN I, POC: 0.07 ng/mL (ref 0.00–0.08)
Troponin i, poc: 0.05 ng/mL (ref 0.00–0.08)

## 2014-11-15 MED ORDER — BENZONATATE 100 MG PO CAPS: 100.0000 mg | ORAL_CAPSULE | Freq: Three times a day (TID) | ORAL | Status: AC | PRN

## 2014-11-15 MED ORDER — KETOROLAC TROMETHAMINE 30 MG/ML IJ SOLN
30.0000 mg | Freq: Once | INTRAMUSCULAR | Status: AC
  Administered 2014-11-15: 30 mg via INTRAVENOUS
  Filled 2014-11-15: qty 1

## 2014-11-15 NOTE — ED Notes (Signed)
RN introduced self to pt and requested that he provide a urine spec., he appears very groggy.  RN provided pt w/ a drink to assist him in providing the spec.

## 2014-11-15 NOTE — ED Provider Notes (Signed)
CSN: [id number]     Arrival date & time 11/15/14  1934 History   First MD Initiated Contact with Patient 11/15/14 2006     Chief Complaint  Patient presents with  . Chest Pain     (Consider location/radiation/quality/duration/timing/severity/associated sxs/prior Treatment) HPI Comments: Patient is a 69 year old male with a history of systolic and diastolic CHF, dyslipidemia, ischemic cardiomyopathy (LVEF 45-50% in March 2016), polysubstance abuse, CAD s/p CABG x 3 in 2009, CKD, ACS x 2 in 2009, COPD, bipolar disorder, and schizophrenia. He presents to the emergency department today for evaluation of left-sided chest pain which she states began at 1600. Patient states that his pain radiates to his L side. He reports associated dizziness characterized as feeling unsteady. He also reports nausea and vomiting x 3. Patient states that he developed a cough after onset of his pain. Cough is intermittently productive of white, thick sputum. He denies associated syncope, fever, upper extremity weakness, or upper extremity numbness. No medications taken PTA for symptoms. Patient states that he has not used crack cocaine in 10 days.  This is the patient's 5th visit to the ED since 11/06/13. He was admitted on 11/10/14 and discharged on 11/12/14.  The history is provided by the patient. No language interpreter was used.    Past Medical History  Diagnosis Date  . Iron deficiency anemia   . Chronic combined systolic and diastolic CHF, NYHA class 2     a. 11/2013 Echo: EF 40-45%, basl-mid inflat AK, Gr 2 DD.  [name] Hyperlipidemia LDL goal <70   . Ischemic cardiomyopathy     a. 11/2013 Echo: EF 40-45%, basl-mid inflat AK, Gr 2 DD, mild AI, mod dil LA/RA, mod reduced RV fxn, PASP 109mmHg.  . Moderate to severe pulmonary hypertension 03/2010    a. PA peak pressure of 76 mmHg (per 2D Echo 03/2010)  . Polysubstance abuse     a. cocaine, THC  . AV malformation of gastrointestinal tract     a. w/ h/o GIB.  [name] Family  history of early CAD   . Peripheral vascular disease   . Seizure disorder   . Hypertension   . CAD (coronary artery disease)     a. s/p 3-vessel CABG (12/2007) // 100% RCA stenosis with collaterals from left system. Severe bifurcation lesions of proxima CXA and OM. Moderate LAD disease - followed by Dr. Wyline Copas in [facility];  b. 11/2013 Myoview: Large fixed inf defect w/o ischemia, EF 35%.  . Chronic kidney disease (CKD), stage III (moderate)     a. BL SCr 1.5-1.6  . Renal artery stenosis   . Cancer     "I have cancer; right now it's not known what kind" (09/17/2014)  . Myocardial infarction 08/2007; 12/2007  . Anginal pain   . COPD (chronic obstructive pulmonary disease)   . Pneumonia     "I've had pneumonia once" (09/17/2014)  . Obstructive sleep apnea     "never told me to wear mask" (09/17/2014)  . History of blood transfusion "I've had quite a few"    "they don't know where it's coming from" (09/17/2014)  . History of hiatal hernia   . Seizures     "I've had a few; don't know what kind they call it" (09/17/2014)  . DDD (degenerative disc disease), lumbar   . Arthritis     "ankles; left hip; wrists" (09/17/2014)  . Chronic lower back pain   . Depression   . Bipolar disorder   . Schizophrenia   .  BPH (benign prostatic hyperplasia)    Past Surgical History  Procedure Laterality Date  . Apc  03/2010    To treat small bowel AVMs  . Esophagogastroduodenoscopy N/A 08/14/2012    Procedure: ESOPHAGOGASTRODUODENOSCOPY (EGD);  Surgeon: [name], MD;  Location: [facility] ENDOSCOPY;  Service: Endoscopy;  Laterality: N/A;  . Hot hemostasis N/A 08/14/2012    Procedure: HOT HEMOSTASIS (ARGON PLASMA COAGULATION/BICAP);  Surgeon: [name], MD;  Location: [facility] ENDOSCOPY;  Service: Endoscopy;  Laterality: N/A;  . Esophagogastroduodenoscopy (egd) with propofol N/A 08/04/2013    Procedure: ESOPHAGOGASTRODUODENOSCOPY (EGD) WITH PROPOFOL;  Surgeon: [name], MD;  Location: [facility] ENDOSCOPY;  Service:  Endoscopy;  Laterality: N/A;  . Esophagogastroduodenoscopy (egd) with propofol N/A 06/08/2014    Procedure: ESOPHAGOGASTRODUODENOSCOPY (EGD) WITH PROPOFOL;  Surgeon: Milus Banister, MD;  Location: [facility];  Service: Endoscopy;  Laterality: N/A;  . Peripheral vascular catheterization N/A 09/07/2014    Procedure: Abdominal Aortogram;  Surgeon: Angelia Mould, MD;  Location: [facility] LAB;  Service: Cardiovascular;  Laterality: N/A;  . Coronary artery bypass graft  12/2007    "CABG X3"  . Coronary angioplasty with stent placement  08/2007  . Enteroscopy N/A 10/16/2014    Procedure: ENTEROSCOPY;  Surgeon: [name], MD;  Location: [facility] ENDOSCOPY;  Service: Endoscopy;  Laterality: N/A;   Family History  Problem Relation Age of Onset  . Heart disease Mother     unknown type  . Heart disease Father 64    died of MI at 85yo  . Heart disease Paternal Grandfather 63    died of MI  . Heart disease    . Heart disease Brother 58   Social History  Substance Use Topics  . Smoking status: Former Smoker -- 0.25 packs/day for 10 years    Types: Cigarettes  . Smokeless tobacco: Never Used     Comment: "quit smoking cigarettes in 2009; smoked off and on since I was 13"  . Alcohol Use: No     Comment: 09/17/2014 "maybe 4 beers/month, 12 oz cans"    Review of Systems  Constitutional: Negative for fever.  Respiratory: Positive for cough.   Cardiovascular: Positive for chest pain.  Gastrointestinal: Positive for nausea and vomiting.  Neurological: Negative for syncope, weakness and numbness.  All other systems reviewed and are negative.   Allergies  Motrin and Tylenol  Home Medications   Prior to Admission medications   Medication Sig Start Date End Date Taking? Authorizing Provider  acetaminophen (TYLENOL) 325 MG tablet Take 2 tablets (650 mg total) by mouth every 4 (four) hours as needed for headache or mild pain. 11/12/14  Yes Robbie Lis, MD  albuterol (PROVENTIL  HFA;VENTOLIN HFA) 108 (90 BASE) MCG/ACT inhaler Inhale 2 puffs into the lungs every 6 (six) hours as needed for wheezing or shortness of breath. 04/30/14  Yes Thurnell Lose, MD  amLODipine (NORVASC) 10 MG tablet Take 1 tablet (10 mg total) by mouth daily. 09/18/14  Yes Shanker Kristeen Mans, MD  aspirin EC 81 MG tablet Take 1 tablet (81 mg total) by mouth daily. RESUME AFTER 1 WEEK. Patient taking differently: Take 81 mg by mouth daily.  10/18/14  Yes Bonnielee Haff, MD  atorvastatin (LIPITOR) 80 MG tablet Take 1 tablet (80 mg total) by mouth daily at 6 PM. 10/07/14  Yes Charlynne Cousins, MD  budesonide-formoterol [facility]) 80-4.5 MCG/ACT inhaler Inhale 2 puffs into the lungs 2 (two) times daily. 09/08/14  Yes Ripudeep Krystal Eaton, MD  citalopram (CELEXA) 20 MG tablet Take 20 mg by mouth daily.   Yes Historical Provider, MD  cloNIDine (CATAPRES) 0.3 MG tablet Take 0.3 mg by mouth 2 (two) times daily. 11/09/14  Yes Historical Provider, MD  divalproex (DEPAKOTE ER) 500 MG 24 hr tablet Take 500 mg by mouth 2 (two) times daily. 05/29/14  Yes Historical Provider, MD  ferrous sulfate 325 (65 FE) MG tablet Take 325 mg by mouth 2 (two) times daily with a meal.    Yes Historical Provider, MD  furosemide (LASIX) 80 MG tablet Take 80 mg by mouth 2 (two) times daily. 11/09/14  Yes Historical Provider, MD  hydrALAZINE (APRESOLINE) 100 MG tablet Take 100 mg by mouth 3 (three) times daily. 05/29/14  Yes Historical Provider, MD  isosorbide mononitrate (IMDUR) 30 MG 24 hr tablet Take 2 tablets (60 mg total) by mouth daily. 07/26/14  Yes Modena Jansky, MD  lisinopril (PRINIVIL,ZESTRIL) 20 MG tablet Take 20 mg by mouth 2 (two) times daily. 10/01/14  Yes Historical Provider, MD  metolazone (ZAROXOLYN) 5 MG tablet Take 1 tablet (5 mg total) by mouth as needed (for weight gain of 2 lbs in 2-3 days). 10/18/14  Yes Bonnielee Haff, MD  nitroGLYCERIN (NITROSTAT) 0.4 MG SL tablet Place 1 tablet (0.4 mg total) under the tongue every 5 (five)  minutes as needed for chest pain (upto max 3 doses at one time.). 07/26/14  Yes Modena Jansky, MD  pantoprazole (PROTONIX) 40 MG tablet Take 40 mg by mouth daily.    Yes Historical Provider, MD  phenytoin (DILANTIN) 200 MG ER capsule Take 1 capsule (200 mg total) by mouth 2 (two) times daily. 10/31/14  Yes Janece Canterbury, MD  potassium chloride SA (KLOR-CON M20) 20 MEQ tablet Take 20 mEq by mouth daily. 01/17/10  Yes Historical Provider, MD  torsemide (DEMADEX) 20 MG tablet Take 2 tablets (40 mg total) by mouth daily. 10/18/14  Yes Bonnielee Haff, MD  diclofenac sodium (VOLTAREN) 1 % GEL Apply 4 g topically 4 (four) times daily. Patient not taking: Reported on 11/15/2014 09/22/14   April Palumbo, MD  HYDROcodone-acetaminophen (NORCO/VICODIN) 5-325 MG per tablet Take 1-2 tablets by mouth every 6 (six) hours as needed for moderate pain. Patient not taking: Reported on 11/15/2014 10/18/14   Bonnielee Haff, MD   BP 159/95 mmHg  Pulse 90  Temp(Src) 98.2 F (36.8 C) (Oral)  Resp 21  SpO2 99%   Physical Exam  Constitutional: He is oriented to person, place, and time. He appears well-developed and well-nourished. No distress.  Nontoxic/nonseptic appearing  HENT:  Head: Normocephalic and atraumatic.  Eyes: Conjunctivae and EOM are normal. No scleral icterus.  Neck: Normal range of motion.  Cardiovascular: Normal rate, regular rhythm and intact distal pulses.   Pulmonary/Chest: Effort normal. No respiratory distress. He has no wheezes.  Respirations even and unlabored  Musculoskeletal: Normal range of motion.  Neurological: He is alert and oriented to person, place, and time. He exhibits normal muscle tone. Coordination normal.  GCS 15. Patient moving all extremities.  Skin: Skin is warm and dry. No rash noted. He is not diaphoretic. No erythema. No pallor.  Psychiatric: He has a normal mood and affect. His behavior is normal.  Nursing note and vitals reviewed.   ED Course  Procedures (including  critical care time) Labs Review Labs Reviewed  BASIC METABOLIC PANEL - Abnormal; Notable for the following:    BUN 24 (*)    Calcium 8.8 (*)    GFR calc  non Af Amer 58 (*)    All other components within normal limits  CBC - Abnormal; Notable for the following:    WBC 3.9 (*)    RBC 3.13 (*)    Hemoglobin 8.0 (*)    HCT 26.4 (*)    MCH 25.6 (*)    RDW 15.9 (*)    All other components within normal limits  URINE RAPID DRUG SCREEN, HOSP PERFORMED - Abnormal; Notable for the following:    Opiates POSITIVE (*)    All other components within normal limits  BRAIN NATRIURETIC PEPTIDE - Abnormal; Notable for the following:    B Natriuretic Peptide 1611.5 (*)    All other components within normal limits  I-STAT TROPOININ, ED  I-STAT TROPOININ, ED    Imaging Review Dg Chest 2 View  11/15/2014   CLINICAL DATA:  Acute onset of left-sided chest pain, shortness of breath, productive cough and vomiting. Initial encounter.  EXAM: CHEST  2 VIEW  COMPARISON:  Chest radiograph performed 11/10/2014  FINDINGS: The lungs are well-aerated. Vascular congestion is noted. Mild bibasilar airspace opacities may reflect mild interstitial edema or possibly pneumonia. No significant pleural effusion or pneumothorax is seen.  The heart is mildly enlarged. The patient is status post median sternotomy, with evidence of prior CABG. No acute osseous abnormalities are seen.  IMPRESSION: Vascular congestion and mild cardiomegaly. Mild bibasilar airspace opacities may reflect mild interstitial edema or possibly pneumonia.   Electronically Signed   By: Garald Balding M.D.   On: 11/15/2014 20:06   I have personally reviewed and evaluated these images and lab results as part of my medical decision-making.   EKG Interpretation   Date/Time:  Sunday November 15 2014 19:38:49 EDT Ventricular Rate:  89 PR Interval:  164 QRS Duration: 112 QT Interval:  436 QTC Calculation: 530 R Axis:   78 Text Interpretation:  Normal sinus  rhythm Minimal voltage criteria for  LVH, may be normal variant Inferior-posterior infarct , age undetermined T  wave abnormality, consider lateral ischemia Prolonged QT Abnormal ECG T  wave inversions in aVL, V2-V3 new from previous EKG Confirmed by LITTLE  MD, RACHEL ([id number]) on 11/15/2014 7:45:27 PM Also confirmed by   MD,   ([id number])  on 11/15/2014 9:50:34 PM Also confirmed by   MD,   ([id number])  on 11/15/2014 11:41:45 PM       20 35 - Attempted to see; patient not in room 2045 - Patient arrived to room  MDM   Final diagnoses:  Chest pain, unspecified chest pain type  Cough    69 year old male presents to the emergency department for complaints of chest pain. Patient with known chronic chest pain. This is his 5th visit to the ED in the last 9 days; patient hospitalized from 8/23-8/25 for c/o chest pain. He reports no cocaine use in 10 days. He is positive for opiates on UDS, reporting to the nurse that his PCP gave him a prescription for Percocet.  Patient with negative troponin x 2. CXR shows vascular congestion c/w prior imaging. No focal consolidation seen to suggest PNA. No fever or leukocytosis today. No mediastinal widening; doubt dissection. Patient also has no overt signs of CHF on exam. BMP is improved compared to 11/10/14 when admitted.   Patient given Toradol in ED for pain. Narcotics held given substance history. Doubt NSTEMI as cause of pain and no STEMI on EKG; pain is likely due to known chronic pain. Will d/c with instruction for PCP f/u. Return precautions provided.  Patient discharged in good condition.     Antonietta Breach, PA-C 11/16/14 0037   Medical screening examination/treatment/procedure(s) were conducted as a shared visit with non-physician practitioner(s) and myself.  I personally evaluated the patient during the encounter.   EKG Interpretation   Date/Time:  Sunday November 15 2014 19:38:49 EDT Ventricular Rate:  89 PR Interval:  164 QRS  Duration: 112 QT Interval:  436 QTC Calculation: 530 R Axis:   78 Text Interpretation:  Normal sinus rhythm Minimal voltage criteria for  LVH, may be normal variant Inferior-posterior infarct , age undetermined T  wave abnormality, consider lateral ischemia Prolonged QT Abnormal ECG T  wave inversions in aVL, V2-V3 new from previous EKG Confirmed by LITTLE  MD, RACHEL [phone]) on 11/15/2014 7:45:27 PM Also confirmed by Lacinda Axon  MD,  Sasuke Yaffe ([id number])  on 11/15/2014 9:50:34 PM Also confirmed by Lacinda Axon  MD, Rojean Ige  [phone])  on 11/15/2014 11:41:45 PM     Patient presents with chest pain without dyspnea, increased, nausea.. Physical exam normal. Troponin negative 2. EKG shows no acute changes. Patient has primary care follow-up.  Nat Christen, MD 11/24/14 1242

## 2014-11-15 NOTE — ED Notes (Signed)
Pt. reports left chest pain with SOB , productive cough and emesis onset this evening , denies diaphoresis .

## 2014-11-15 NOTE — Discharge Instructions (Signed)
Continue taking your daily prescribed medications including Torsemide 40mg  daily. If you have been taking Torsemide as prescribed, increase your dose to 60mg  (3 tablets) daily until you are able to follow up with your primary care doctor. Take Tessalon as needed for cough.  Chest Pain (Nonspecific) It is often hard to give a specific diagnosis for the cause of chest pain. There is always a chance that your pain could be related to something serious, such as a heart attack or a blood clot in the lungs. You need to follow up with your health care provider for further evaluation. CAUSES   Heartburn.  Pneumonia or bronchitis.  Anxiety or stress.  Inflammation around your heart (pericarditis) or lung (pleuritis or pleurisy).  A blood clot in the lung.  A collapsed lung (pneumothorax). It can develop suddenly on its own (spontaneous pneumothorax) or from trauma to the chest.  Shingles infection (herpes zoster virus). The chest wall is composed of bones, muscles, and cartilage. Any of these can be the source of the pain.  The bones can be bruised by injury.  The muscles or cartilage can be strained by coughing or overwork.  The cartilage can be affected by inflammation and become sore (costochondritis). DIAGNOSIS  Lab tests or other studies may be needed to find the cause of your pain. Your health care provider may have you take a test called an ambulatory electrocardiogram (ECG). An ECG records your heartbeat patterns over a 24-hour period. You may also have other tests, such as:  Transthoracic echocardiogram (TTE). During echocardiography, sound waves are used to evaluate how blood flows through your heart.  Transesophageal echocardiogram (TEE).  Cardiac monitoring. This allows your health care provider to monitor your heart rate and rhythm in real time.  Holter monitor. This is a portable device that records your heartbeat and can help diagnose heart arrhythmias. It allows your health  care provider to track your heart activity for several days, if needed.  Stress tests by exercise or by giving medicine that makes the heart beat faster. TREATMENT   Treatment depends on what may be causing your chest pain. Treatment may include:  Acid blockers for heartburn.  Anti-inflammatory medicine.  Pain medicine for inflammatory conditions.  Antibiotics if an infection is present.  You may be advised to change lifestyle habits. This includes stopping smoking and avoiding alcohol, caffeine, and chocolate.  You may be advised to keep your head raised (elevated) when sleeping. This reduces the chance of acid going backward from your stomach into your esophagus. Most of the time, nonspecific chest pain will improve within 2-3 days with rest and mild pain medicine.  HOME CARE INSTRUCTIONS   If antibiotics were prescribed, take them as directed. Finish them even if you start to feel better.  For the next few days, avoid physical activities that bring on chest pain. Continue physical activities as directed.  Do not use any tobacco products, including cigarettes, chewing tobacco, or electronic cigarettes.  Avoid drinking alcohol.  Only take medicine as directed by your health care provider.  Follow your health care provider's suggestions for further testing if your chest pain does not go away.  Keep any follow-up appointments you made. If you do not go to an appointment, you could develop lasting (chronic) problems with pain. If there is any problem keeping an appointment, call to reschedule. SEEK MEDICAL CARE IF:   Your chest pain does not go away, even after treatment.  You have a rash with blisters on your  chest.  You have a fever. SEEK IMMEDIATE MEDICAL CARE IF:   You have increased chest pain or pain that spreads to your arm, neck, jaw, back, or abdomen.  You have shortness of breath.  You have an increasing cough, or you cough up blood.  You have severe back or  abdominal pain.  You feel nauseous or vomit.  You have severe weakness.  You faint.  You have chills. This is an emergency. Do not wait to see if the pain will go away. Get medical help at once. Call your local emergency services (911 in [place] Do not drive yourself to the hospital. MAKE SURE YOU:   Understand these instructions.  Will watch your condition.  Will get help right away if you are not doing well or get worse. Document Released: 12/14/2004 Document Revised: 03/11/2013 Document Reviewed: 10/10/2007 [facility] Patient Information 2015 [place], [place]. This information is not intended to replace advice given to you by your health care provider. Make sure you discuss any questions you have with your health care provider.  Cough, Adult  A cough is a reflex. It helps you clear your throat and airways. A cough can help heal your body. A cough can last 2 or 3 weeks (acute) or may last more than 8 weeks (chronic). Some common causes of a cough can include an infection, allergy, or a cold. HOME CARE  Only take medicine as told by your doctor.  If given, take your medicines (antibiotics) as told. Finish them even if you start to feel better.  Use a cold steam vaporizer or humidifier in your home. This can help loosen thick spit (secretions).  Sleep so you are almost sitting up (semi-upright). Use pillows to do this. This helps reduce coughing.  Rest as needed.  Stop smoking if you smoke. GET HELP RIGHT AWAY IF:  You have yellowish-white fluid (pus) in your thick spit.  Your cough gets worse.  Your medicine does not reduce coughing, and you are losing sleep.  You cough up blood.  You have trouble breathing.  Your pain gets worse and medicine does not help.  You have a fever. MAKE SURE YOU:   Understand these instructions.  Will watch your condition.  Will get help right away if you are not doing well or get worse. Document Released: 11/17/2010 Document Revised:  07/21/2013 Document Reviewed: 11/17/2010 [facility] Patient Information 2015 [place], [place]. This information is not intended to replace advice given to you by your health care provider. Make sure you discuss any questions you have with your health care provider.

## 2014-11-15 NOTE — ED Notes (Signed)
Pt arrived to room

## 2014-11-16 LAB — RAPID URINE DRUG SCREEN, HOSP PERFORMED
AMPHETAMINES: NOT DETECTED
Barbiturates: NOT DETECTED
Benzodiazepines: NOT DETECTED
COCAINE: NOT DETECTED
OPIATES: POSITIVE — AB
TETRAHYDROCANNABINOL: NOT DETECTED

## 2014-11-24 ENCOUNTER — Encounter: Payer: Self-pay | Admitting: Vascular Surgery

## 2014-11-25 ENCOUNTER — Encounter: Payer: Medicare Other | Admitting: Vascular Surgery

## 2014-11-25 ENCOUNTER — Encounter (HOSPITAL_COMMUNITY): Payer: Medicare Other

## 2014-12-10 ENCOUNTER — Other Ambulatory Visit: Payer: Medicare Other | Admitting: Nurse Practitioner

## 2014-12-10 ENCOUNTER — Ambulatory Visit: Payer: Medicare Other

## 2014-12-10 ENCOUNTER — Ambulatory Visit: Payer: Medicare Other | Admitting: Family

## 2014-12-18 NOTE — Progress Notes (Signed)
Patient no show. Erroneous per Judson Roch, NP .

## 2014-12-31 ENCOUNTER — Other Ambulatory Visit (HOSPITAL_COMMUNITY): Payer: Self-pay | Admitting: Internal Medicine

## 2015-01-28 ENCOUNTER — Ambulatory Visit: Payer: Medicare Other | Admitting: Family

## 2015-01-28 ENCOUNTER — Other Ambulatory Visit: Payer: Medicare Other

## 2015-01-28 ENCOUNTER — Ambulatory Visit: Payer: Medicare Other

## 2015-06-03 IMAGING — CR DG CHEST 2V
2 series · 2 of 2 positions shown · non-contrast
Comparison: PA and lateral chest 12/20/2013, 12/13/2013 and
10/23/2013. CT chest 10/23/2013.

CLINICAL DATA: Shortness of breath, cough and dizziness beginning
this morning.

EXAM:
CHEST  2 VIEW

[w chest pa]
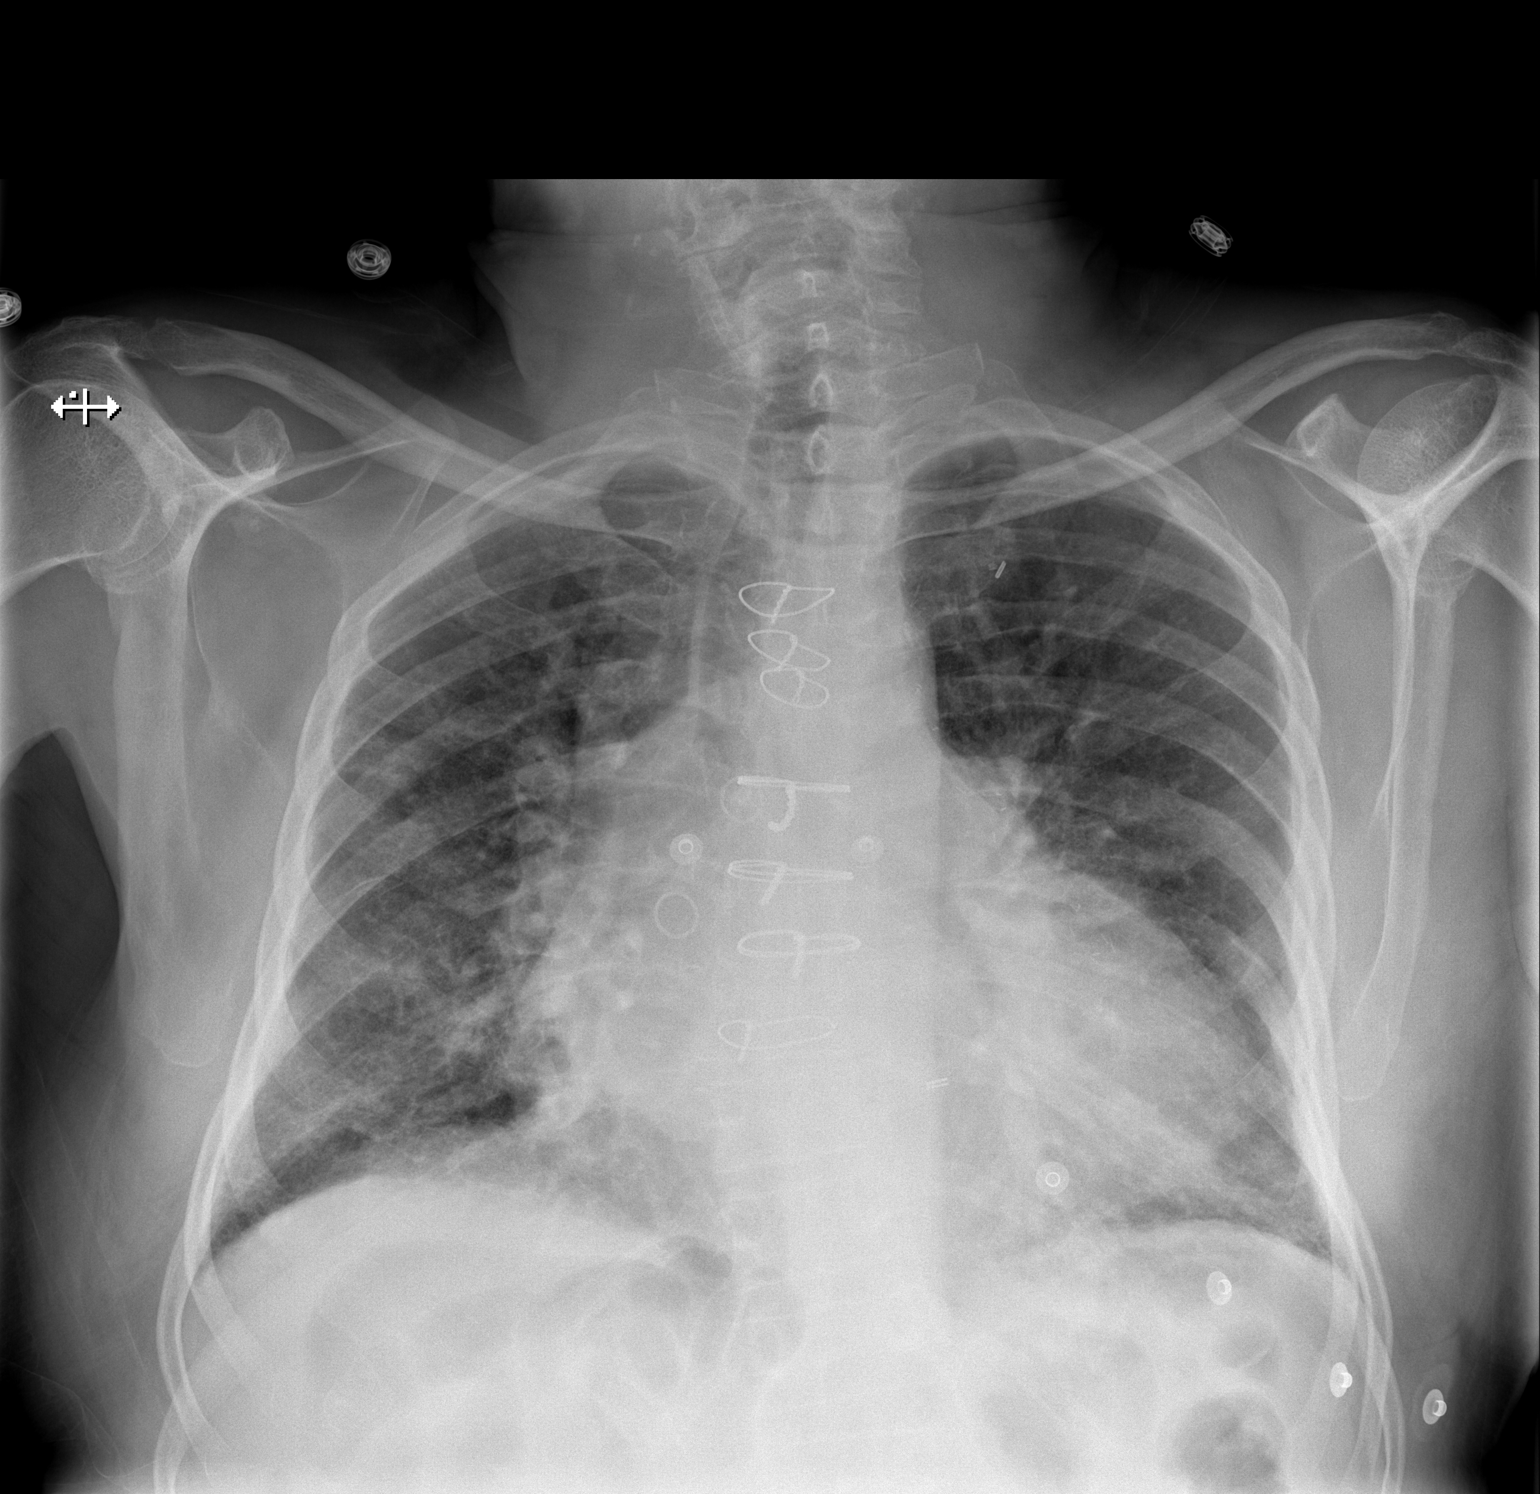

[w chest lat]
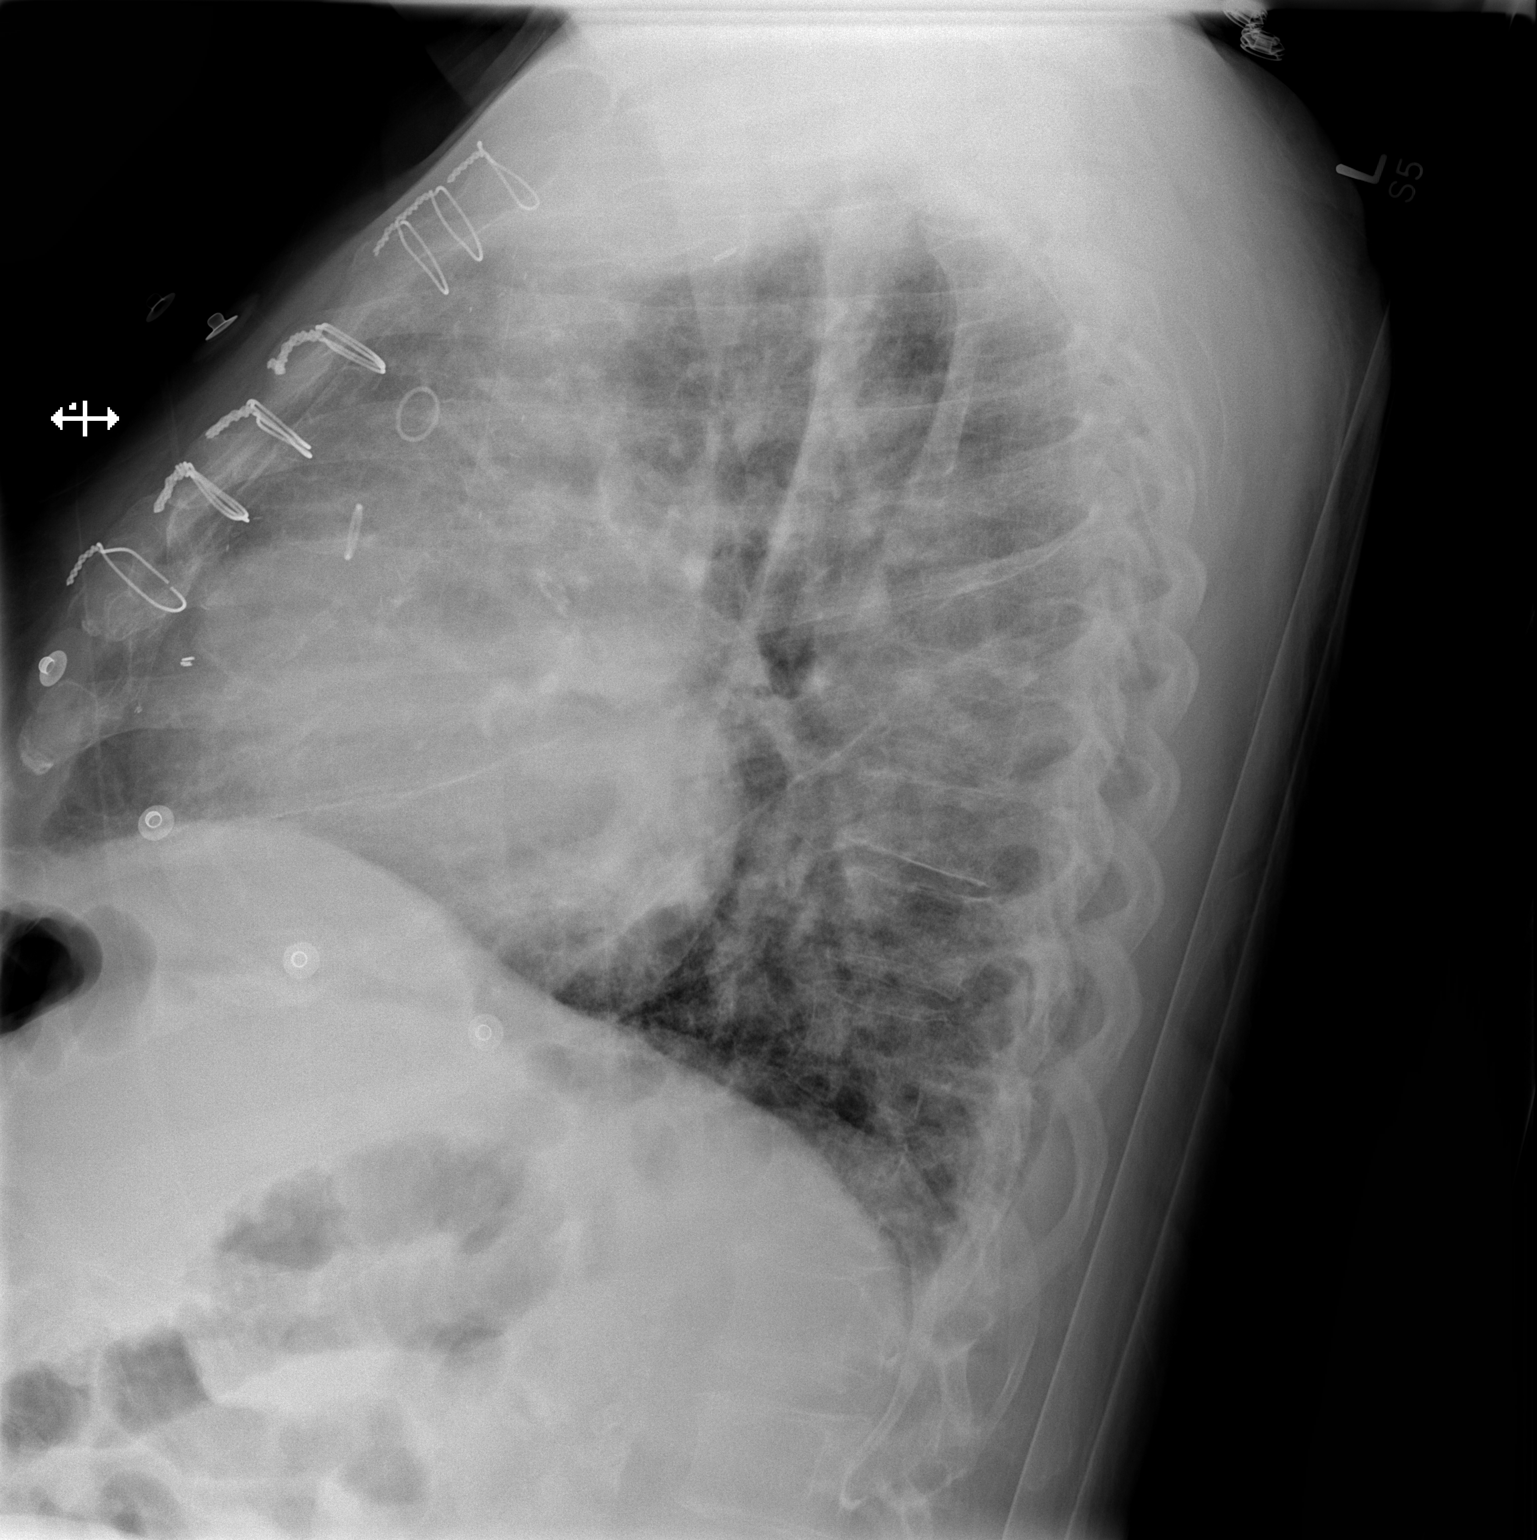

[2 of 2 positions shown; findings below may reference images not displayed]

FINDINGS: The patient is status post CABG. There is cardiomegaly without
edema. Lungs are clear. No pneumothorax or pleural effusion.
IMPRESSION: Cardiomegaly without acute disease.

## 2016-03-16 IMAGING — CT CT ANGIO CHEST
2 of 9 series · 17 of 46 positions shown · IV contrast (omnipaque)
Comparison: None.

CLINICAL DATA: Chest pain.  Shortness of breath.  Low hemoglobin.

EXAM:
CT ANGIOGRAPHY CHEST, ABDOMEN AND PELVIS
TECHNIQUE: Multidetector CT imaging through the chest, abdomen and pelvis was
performed using the standard protocol during bolus administration of
intravenous contrast. Multiplanar reconstructed images and MIPs were
obtained and reviewed to evaluate the vascular anatomy.
CONTRAST:  100mL OMNIPAQUE IOHEXOL 350 MG/ML SOLN

[Series 5: dissection 3.0 i30f 3 · axial · 0.73mm/px · z∈[+962,+1444]mm · 14 of 185 slices shown]
[im 12/185  lung]
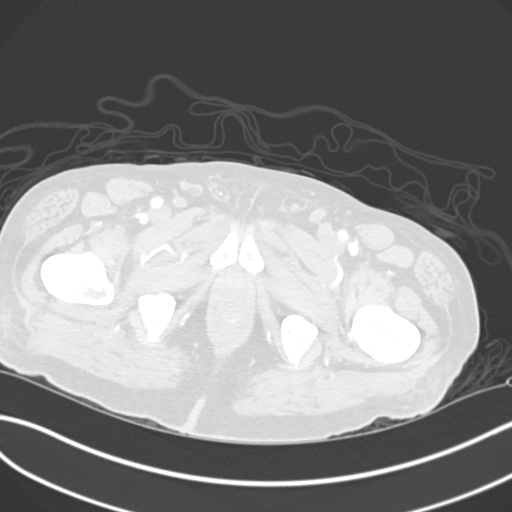
[im 24/185  soft-tissue]
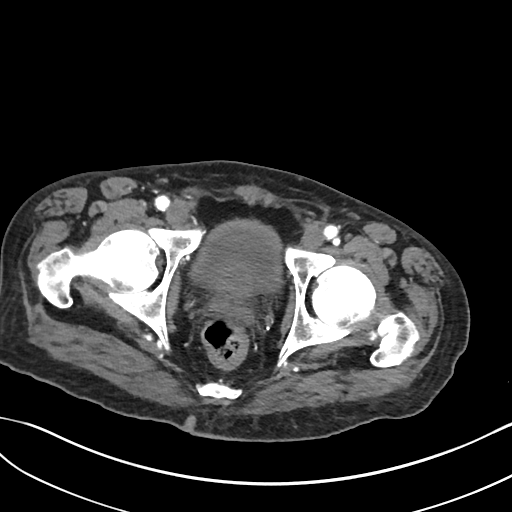
[im 35/185  lung]
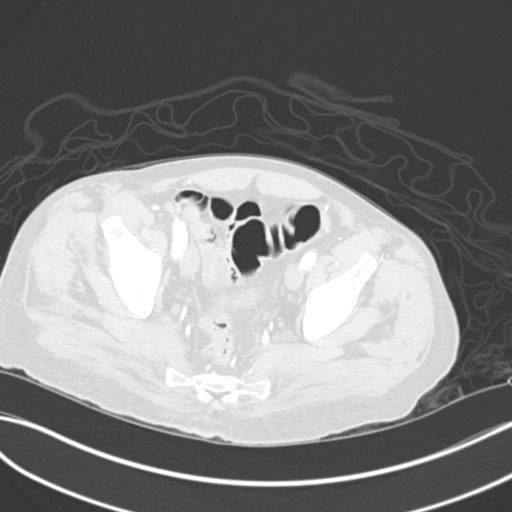
[im 47/185  soft-tissue]
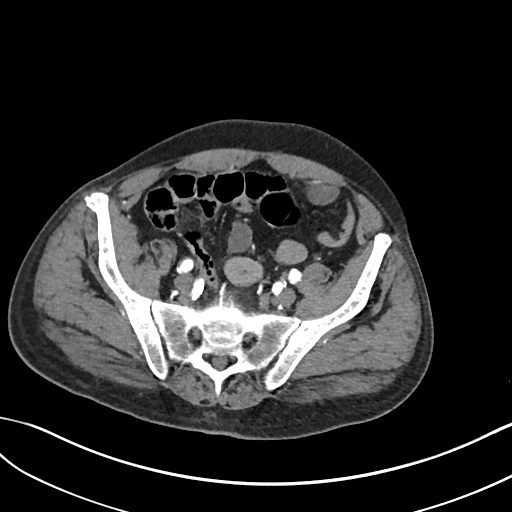
[im 58/185  lung]
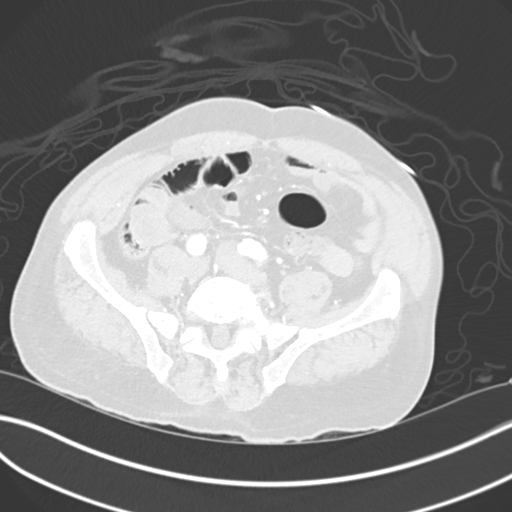
[im 70/185  soft-tissue]
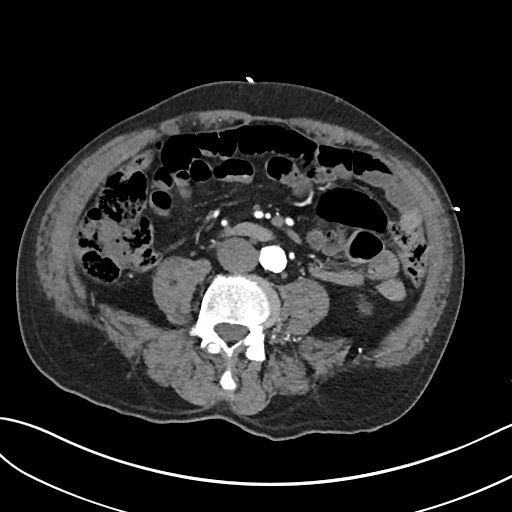
[im 81/185  lung]
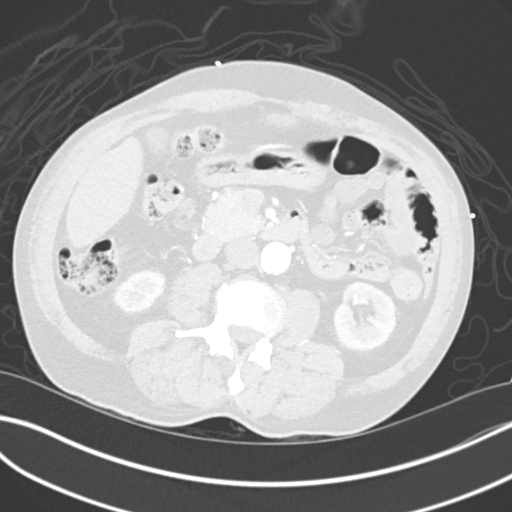
[im 104/185  soft-tissue]
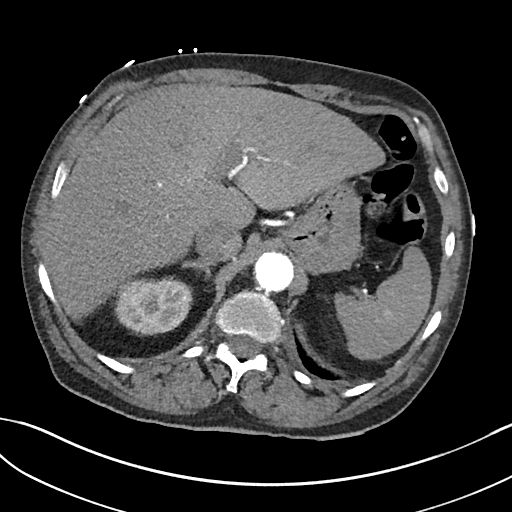
[im 116/185  lung]
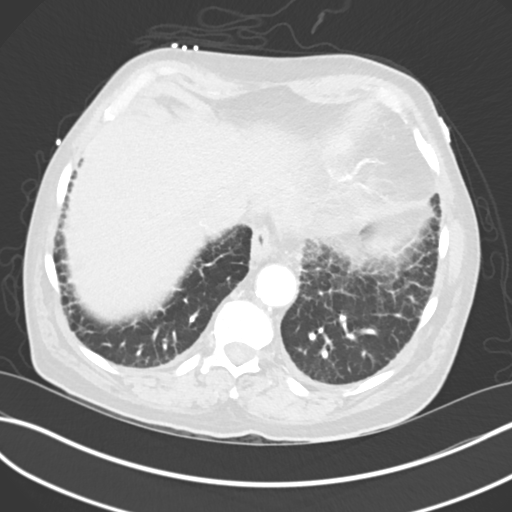
[im 127/185  soft-tissue]
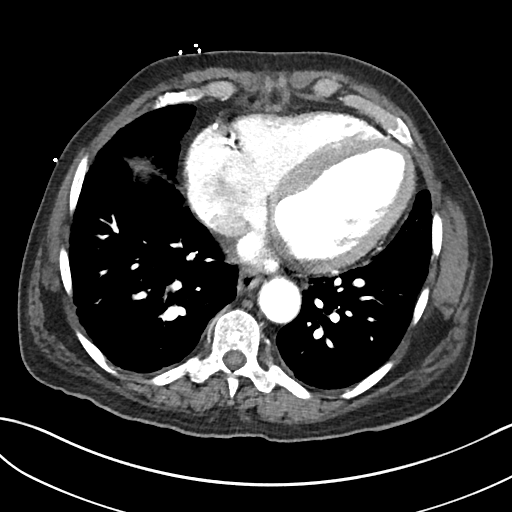
[im 139/185  lung]
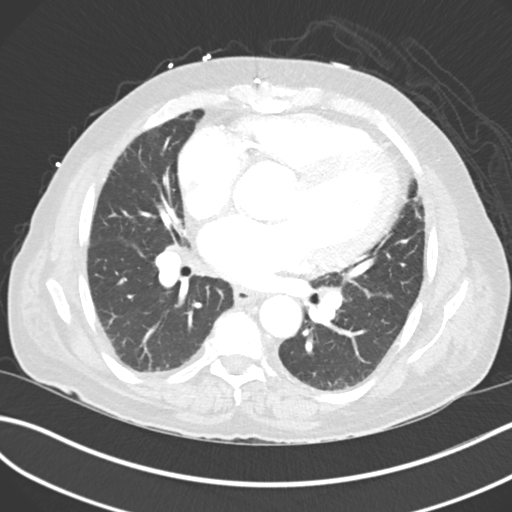
[im 150/185  soft-tissue]
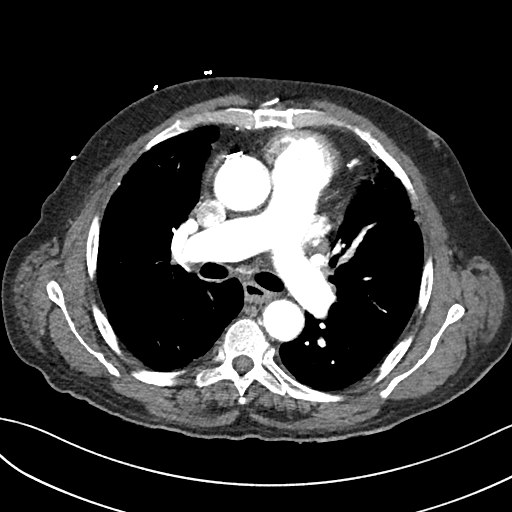
[im 162/185  lung]
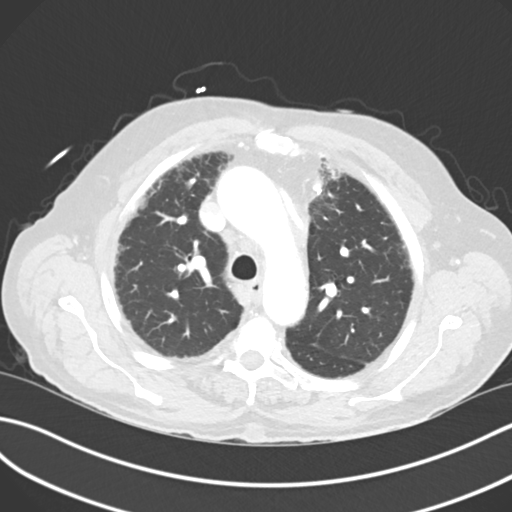
[im 173/185  soft-tissue]
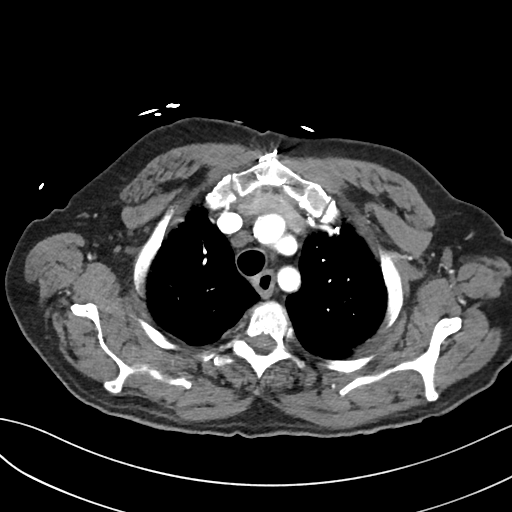

[Series 8: coronals · coronal · 0.75mm/px · 3 of 147 slices shown]
[im 37/147  soft-tissue]
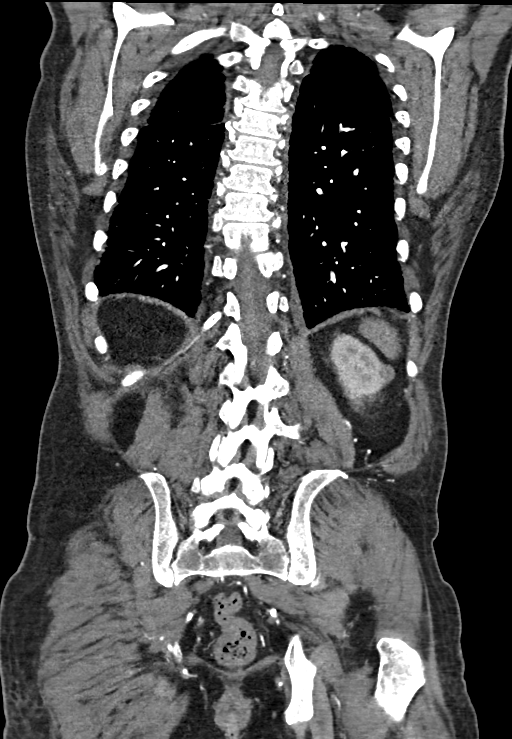
[im 74/147  soft-tissue]
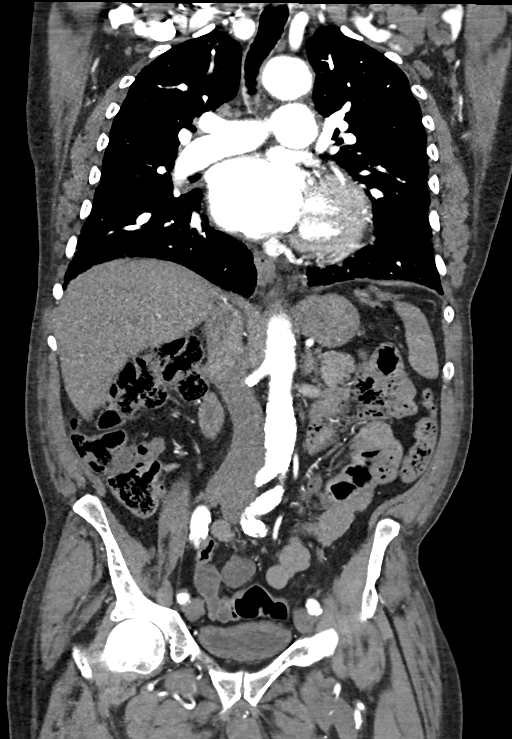
[im 110/147  soft-tissue]
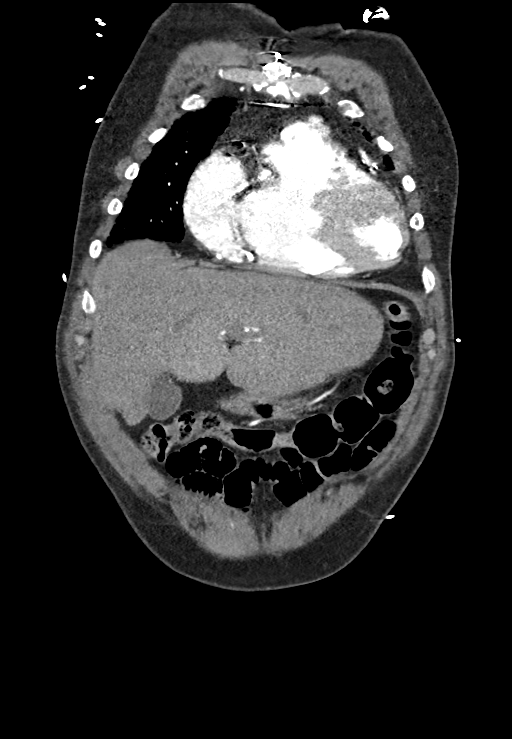

[17 of 46 positions shown; findings below may reference images not displayed]

Chest x-ray dated 10/15/2014 and CT angiogram of the chest abdomen
pelvis dated 10/06/2014
FINDINGS: CTA CHEST FINDINGS

There are no pulmonary emboli and there is no aortic dissection.
There are scattered calcifications in the thoracic aorta. Extensive
coronary artery calcification. Prior CABG. Cardiomegaly, unchanged
since the prior exam. The patient has chronic interstitial lung
disease with slight interstitial edema at the lung bases, improved
since the prior CT scan. Tiny left pleural effusion.

Review of the MIP images confirms the above findings.

CTA ABDOMEN AND PELVIS FINDINGS

Extensive atherosclerosis of the arteries of the abdomen and pelvis.
Severe bilateral proximal common iliac artery stenoses. No
dissection or aneurysm. Single widely patent renal arteries
bilaterally.

Liver, biliary tree, spleen, pancreas, adrenal glands and kidneys
are normal except for an exophytic 12 mm hyperdense cyst on the
lateral aspect of the mid left kidney, unchanged in size in
densities since 03/08/2013.

The bowel is normal. Bladder is normal. No acute osseous
abnormalities.

Review of the MIP images confirms the above findings.
IMPRESSION: 1. No acute abnormalities of the chest or abdomen. Specifically, no
evidence of aortic dissection or pulmonary emboli.
2. Improved slight interstitial pulmonary edema at the lung bases
superimposed on chronic interstitial lung disease.
3. Extensive aortic atherosclerosis with severe stenoses of the
common iliac arteries bilaterally.
